# Patient Record
Sex: Male | Born: 1970
Health system: Southern US, Community
[De-identification: ages and names within clinical notes are randomized; demographics above are authoritative.]

## PROBLEM LIST (undated history)

## (undated) DIAGNOSIS — N189 Chronic kidney disease, unspecified: Secondary | ICD-10-CM

## (undated) DIAGNOSIS — G459 Transient cerebral ischemic attack, unspecified: Secondary | ICD-10-CM

## (undated) DIAGNOSIS — W19XXXA Unspecified fall, initial encounter: Secondary | ICD-10-CM

## (undated) DIAGNOSIS — Z862 Personal history of diseases of the blood and blood-forming organs and certain disorders involving the immune mechanism: Secondary | ICD-10-CM

## (undated) DIAGNOSIS — E119 Type 2 diabetes mellitus without complications: Secondary | ICD-10-CM

## (undated) DIAGNOSIS — N186 End stage renal disease: Secondary | ICD-10-CM

## (undated) DIAGNOSIS — H5462 Unqualified visual loss, left eye, normal vision right eye: Secondary | ICD-10-CM

## (undated) DIAGNOSIS — Z87828 Personal history of other (healed) physical injury and trauma: Secondary | ICD-10-CM

## (undated) DIAGNOSIS — D649 Anemia, unspecified: Secondary | ICD-10-CM

## (undated) DIAGNOSIS — A0472 Enterocolitis due to Clostridium difficile, not specified as recurrent: Secondary | ICD-10-CM

## (undated) DIAGNOSIS — Z992 Dependence on renal dialysis: Secondary | ICD-10-CM

## (undated) DIAGNOSIS — N289 Disorder of kidney and ureter, unspecified: Secondary | ICD-10-CM

## (undated) DIAGNOSIS — I10 Essential (primary) hypertension: Secondary | ICD-10-CM

## (undated) HISTORY — DX: Transient cerebral ischemic attack, unspecified: G45.9

## (undated) HISTORY — PX: KNEE SURGERY: SHX244

## (undated) HISTORY — PX: SKIN SPLIT GRAFT: SHX444

## (undated) HISTORY — PX: FOOT SURGERY: SHX648

---

## 1997-05-09 ENCOUNTER — Emergency Department (HOSPITAL_COMMUNITY): Admission: EM | Admit: 1997-05-09 | Discharge: 1997-05-09 | Payer: Self-pay | Admitting: Emergency Medicine

## 2000-08-07 ENCOUNTER — Encounter: Payer: Self-pay | Admitting: Family Medicine

## 2000-08-07 ENCOUNTER — Ambulatory Visit (HOSPITAL_COMMUNITY): Admission: RE | Admit: 2000-08-07 | Discharge: 2000-08-07 | Payer: Self-pay | Admitting: Family Medicine

## 2000-12-23 ENCOUNTER — Encounter: Admission: RE | Admit: 2000-12-23 | Discharge: 2001-01-21 | Payer: Self-pay | Admitting: Orthopedic Surgery

## 2001-03-23 ENCOUNTER — Emergency Department (HOSPITAL_COMMUNITY): Admission: EM | Admit: 2001-03-23 | Discharge: 2001-03-24 | Payer: Self-pay | Admitting: Emergency Medicine

## 2005-02-11 ENCOUNTER — Inpatient Hospital Stay (HOSPITAL_COMMUNITY): Admission: EM | Admit: 2005-02-11 | Discharge: 2005-02-16 | Payer: Self-pay | Admitting: Emergency Medicine

## 2005-02-11 IMAGING — CR DG CHEST 1V PORT
1 series · 1 of 1 positions shown · non-contrast
Comparison: None.

CLINICAL DATA: Diabetic ketoacidosis. Shortness of breath.

PORTABLE CHEST - 1 VIEW  [DATE]/[PHONE_NUMBER] hours:

[view not recorded]
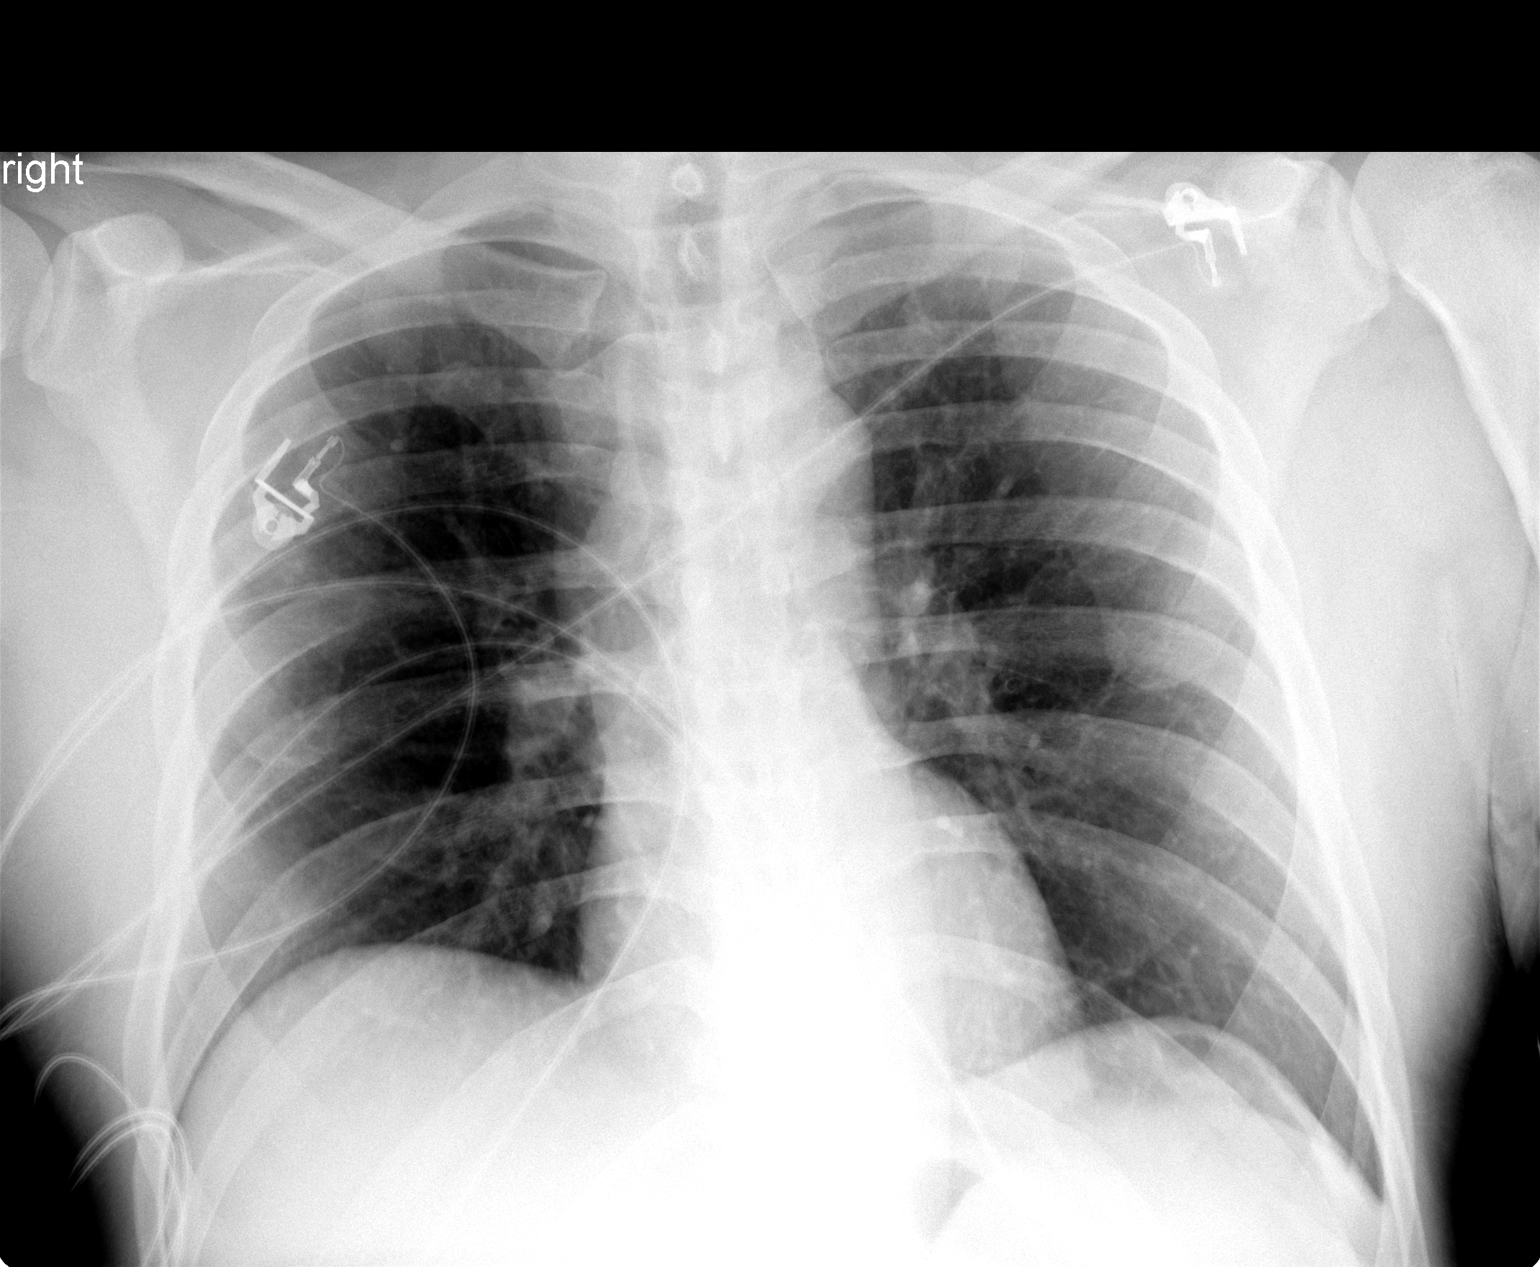

[1 of 1 positions shown; findings below may reference images not displayed]

FINDINGS: The heart size is normal. The lungs appear clear. There are no
significant pleural effusions.
IMPRESSION: No acute cardiopulmonary disease.

## 2005-02-11 IMAGING — CT CT PELVIS W/O CM
1 of 2 series · 15 of 32 positions shown, 19 images · IV contrast (agent unspecified)
Comparison: None available.

CLINICAL DATA: Abdominal and pelvic pain with right flank pain. 
ABDOMEN CT WITHOUT CONTRAST:
TECHNIQUE: Multidetector CT imaging of the abdomen was performed following the standard protocol without IV contrast.
TECHNIQUE: Multidetector CT imaging of the pelvis was performed following the standard protocol without IV contrast.

[Series 2: stone_wo 5.0 b40f st · axial · 0.72mm/px · z∈[-477,-17]mm · 15 of 126 slices shown, 19 images]
[im 6/126  soft-tissue]
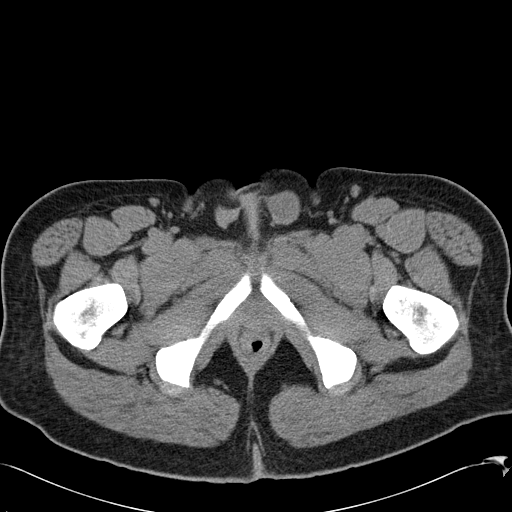
[im 6/126  bone]
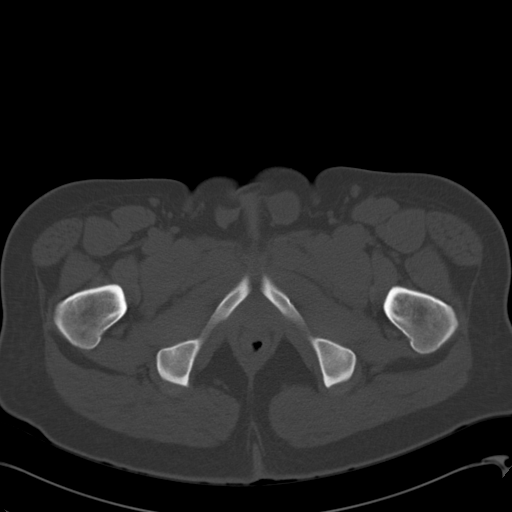
[im 16/126  soft-tissue]
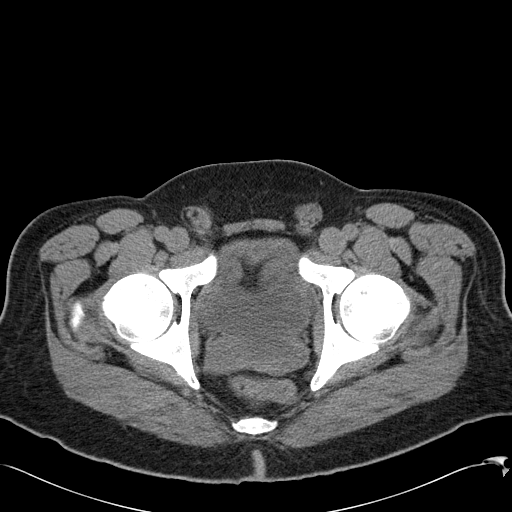
[im 26/126  soft-tissue]
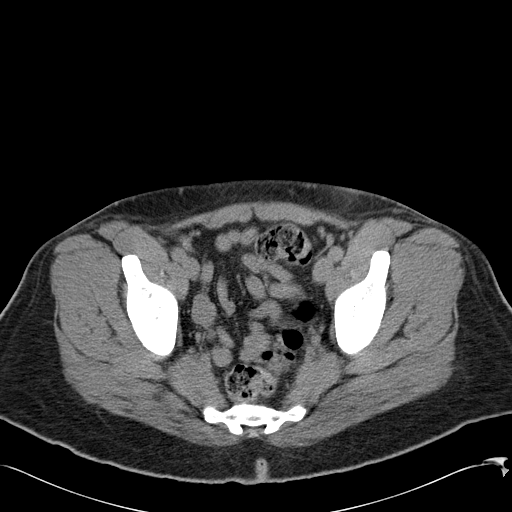
[im 36/126  soft-tissue]
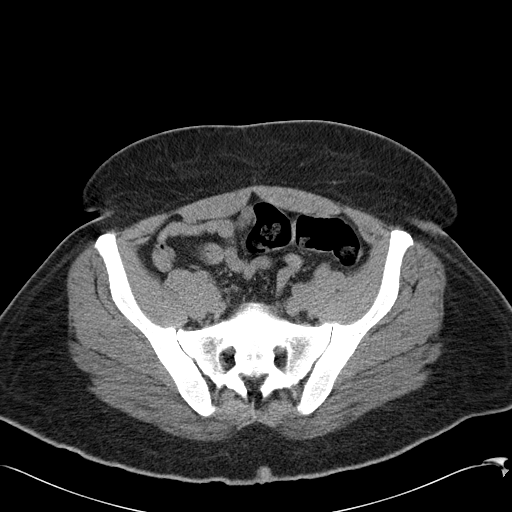
[im 46/126  soft-tissue]
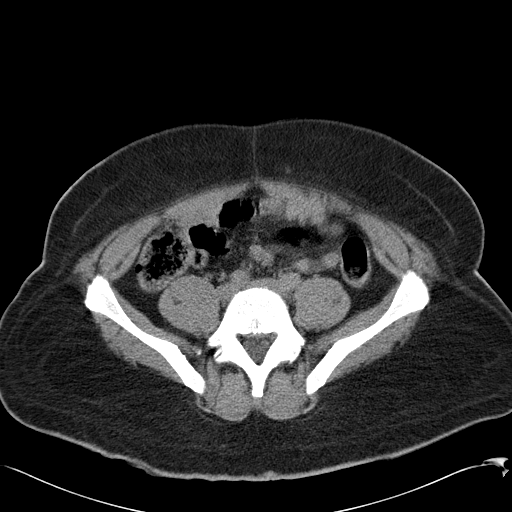
[im 56/126  soft-tissue]
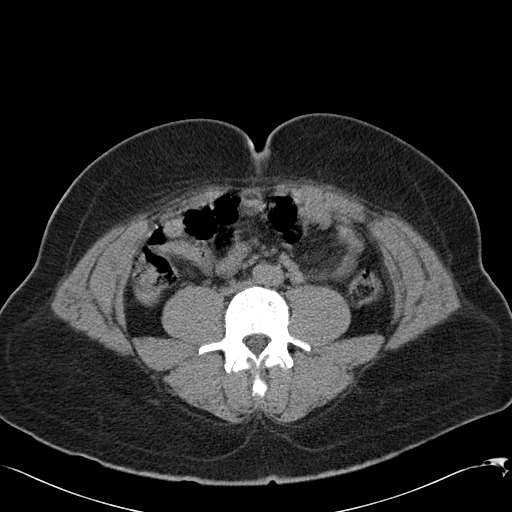
[im 66/126  soft-tissue]
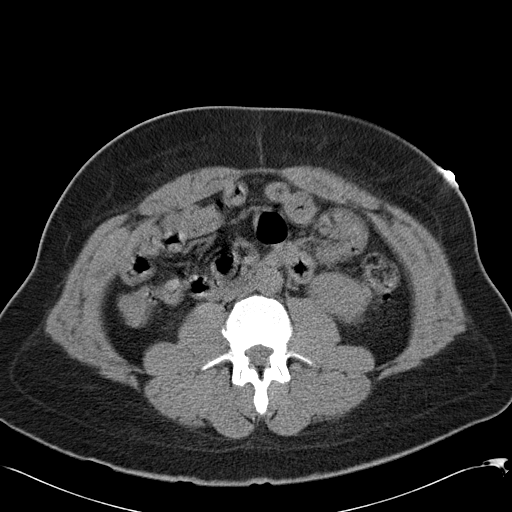
[im 71/126  soft-tissue]
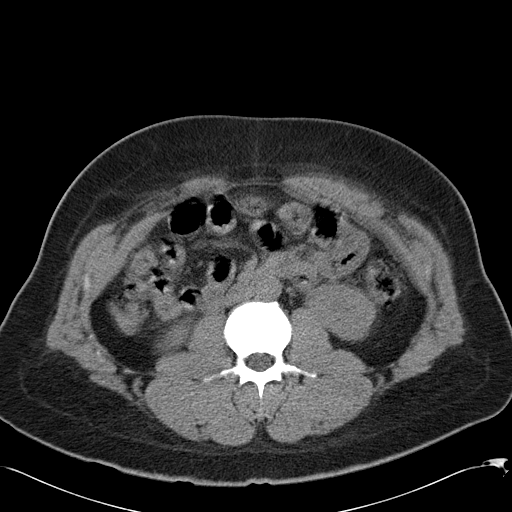
[im 81/126  soft-tissue]
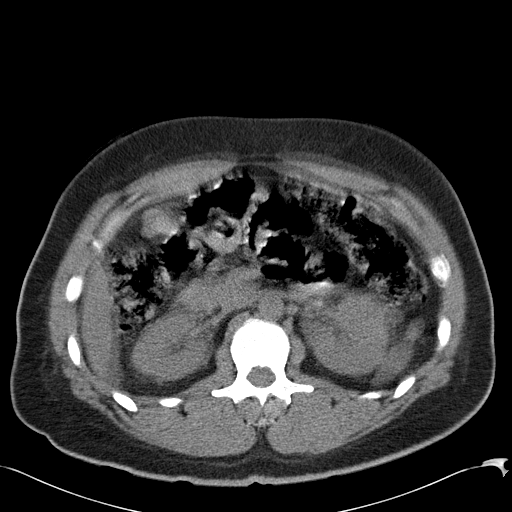
[im 81/126  bone]
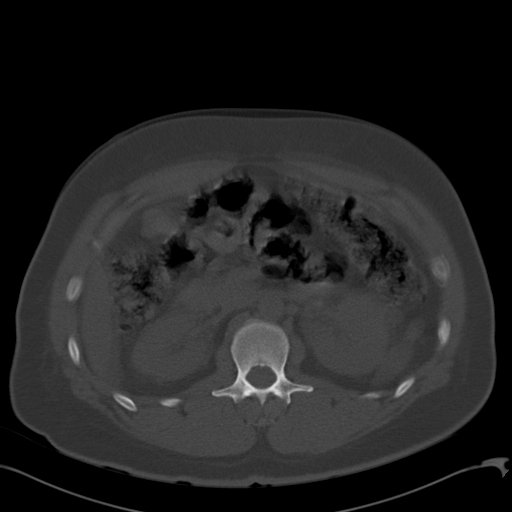
[im 91/126  soft-tissue]
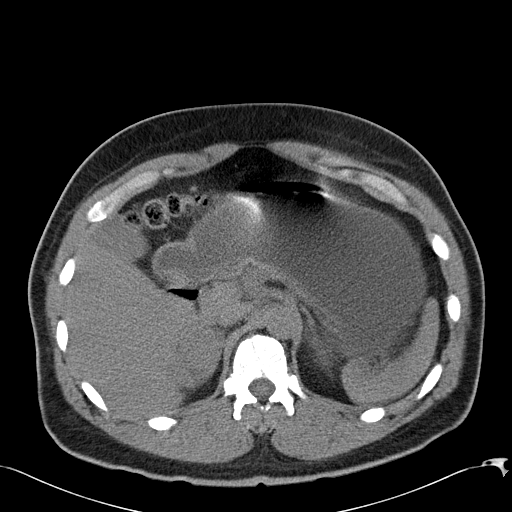
[im 101/126  soft-tissue]
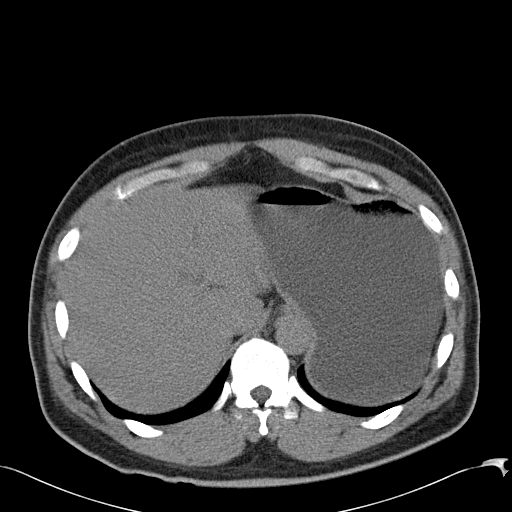
[im 106/126  lung]
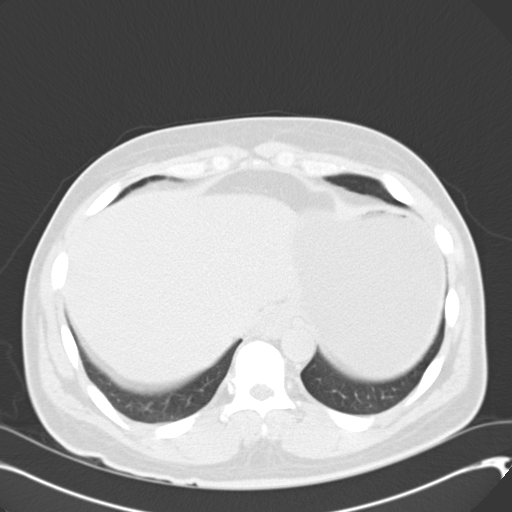
[im 111/126  soft-tissue]
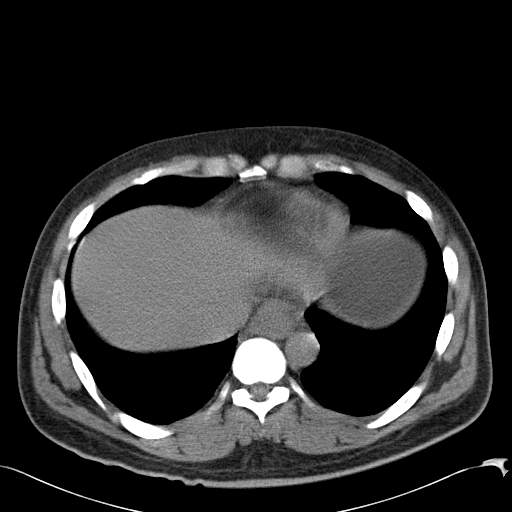
[im 111/126  lung]
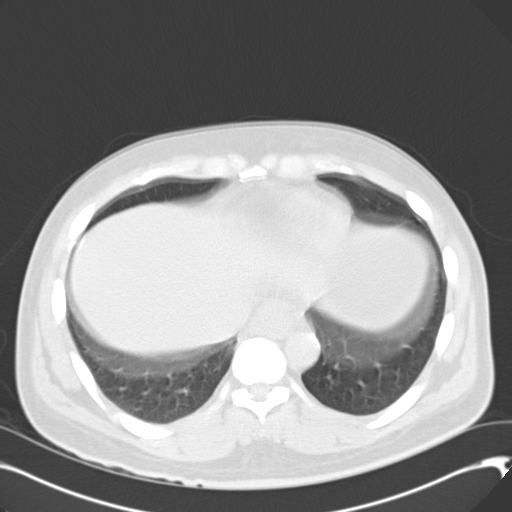
[im 116/126  lung]
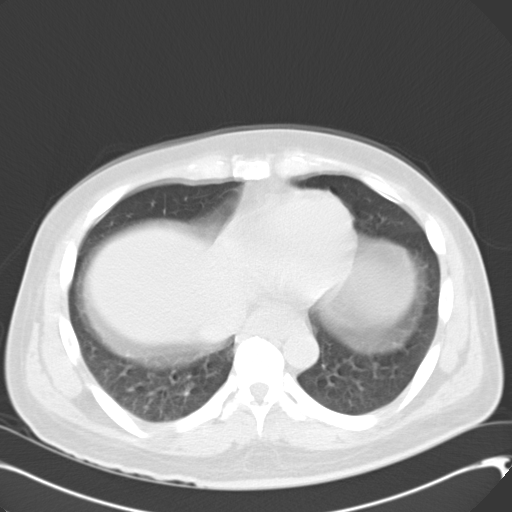
[im 121/126  soft-tissue]
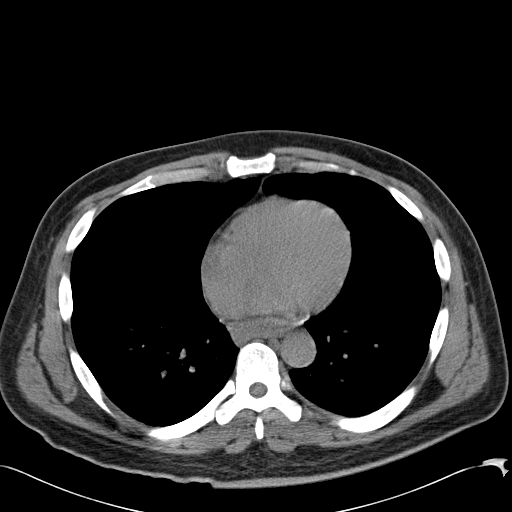
[im 121/126  lung]
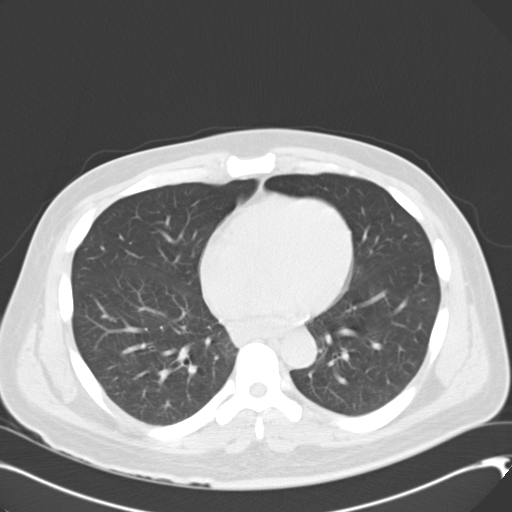

[15 of 32 positions shown; findings below may reference images not displayed]

FINDINGS: The study is slightly limited secondary to motion.
Mild perinephric inflammation bilaterally is identified, without evidence of other renal abnormality.  There is no evidence of hydronephrosis or urinary calculi.  The liver, spleen, adrenal glands, gallbladder, and pancreas are unremarkable.  Please note that parenchymal abnormalities may be missed as intravenous contrast was not administered.  No free fluid or enlarged lymph nodes, biliary dilatation or abdominal aortic aneurysm.  Mild gastric distention is noted.  Remainder of the bowel is unremarkable.  An umbilical hernia containing fat is noted.
IMPRESSION: Nonspecific bilateral perinephric inflammation ? differential includes infection/pyelonephritis, or other causes of inflammation.  No evidence of urinary calculi.  
PELVIS CT WITHOUT CONTRAST:
FINDINGS: The bowel, bladder, and appendix are unremarkable.  No free fluid or enlarged lymph nodes.  Soft tissue in both inguinal canals may represent retracted testicles but correlate clinically.
IMPRESSION: 1.  No acute abnormality. 
2.  Soft tissue in both inguinal canals ? suspect retracted testicles but correlate clinically.

## 2005-02-15 ENCOUNTER — Ambulatory Visit: Payer: Self-pay | Admitting: Internal Medicine

## 2005-02-19 ENCOUNTER — Ambulatory Visit: Payer: Self-pay | Admitting: Nurse Practitioner

## 2005-08-17 ENCOUNTER — Emergency Department (HOSPITAL_COMMUNITY): Admission: EM | Admit: 2005-08-17 | Discharge: 2005-08-17 | Payer: Self-pay | Admitting: Emergency Medicine

## 2005-10-01 ENCOUNTER — Emergency Department (HOSPITAL_COMMUNITY): Admission: EM | Admit: 2005-10-01 | Discharge: 2005-10-02 | Payer: Self-pay | Admitting: Emergency Medicine

## 2015-01-09 DIAGNOSIS — K3184 Gastroparesis: Secondary | ICD-10-CM

## 2015-01-09 HISTORY — DX: Gastroparesis: K31.84

## 2016-01-09 HISTORY — PX: HERNIA REPAIR: SHX51

## 2016-07-05 DIAGNOSIS — E538 Deficiency of other specified B group vitamins: Secondary | ICD-10-CM | POA: Insufficient documentation

## 2016-07-05 DIAGNOSIS — E878 Other disorders of electrolyte and fluid balance, not elsewhere classified: Secondary | ICD-10-CM | POA: Insufficient documentation

## 2016-07-05 DIAGNOSIS — Z578 Occupational exposure to other risk factors: Secondary | ICD-10-CM | POA: Insufficient documentation

## 2016-07-05 DIAGNOSIS — N2581 Secondary hyperparathyroidism of renal origin: Secondary | ICD-10-CM | POA: Insufficient documentation

## 2016-07-05 DIAGNOSIS — N2589 Other disorders resulting from impaired renal tubular function: Secondary | ICD-10-CM | POA: Insufficient documentation

## 2016-07-05 DIAGNOSIS — M834 Aluminum bone disease: Secondary | ICD-10-CM | POA: Insufficient documentation

## 2016-07-05 DIAGNOSIS — E639 Nutritional deficiency, unspecified: Secondary | ICD-10-CM | POA: Insufficient documentation

## 2016-07-05 HISTORY — DX: Occupational exposure to other risk factors: Z57.8

## 2016-07-05 HISTORY — DX: Deficiency of other specified B group vitamins: E53.8

## 2016-07-30 DIAGNOSIS — E209 Hypoparathyroidism, unspecified: Secondary | ICD-10-CM

## 2016-07-30 HISTORY — DX: Hypoparathyroidism, unspecified: E20.9

## 2017-06-13 ENCOUNTER — Encounter (HOSPITAL_COMMUNITY): Payer: Self-pay | Admitting: Emergency Medicine

## 2017-06-13 ENCOUNTER — Other Ambulatory Visit: Payer: Self-pay

## 2017-06-13 ENCOUNTER — Emergency Department (HOSPITAL_COMMUNITY)
Admission: EM | Admit: 2017-06-13 | Discharge: 2017-06-14 | Disposition: A | Discharge status: Home / Self Care | Payer: Self-pay | Attending: Emergency Medicine | Admitting: Emergency Medicine

## 2017-06-13 DIAGNOSIS — I10 Essential (primary) hypertension: Secondary | ICD-10-CM | POA: Insufficient documentation

## 2017-06-13 DIAGNOSIS — R112 Nausea with vomiting, unspecified: Secondary | ICD-10-CM

## 2017-06-13 DIAGNOSIS — E119 Type 2 diabetes mellitus without complications: Secondary | ICD-10-CM | POA: Insufficient documentation

## 2017-06-13 DIAGNOSIS — Z79899 Other long term (current) drug therapy: Secondary | ICD-10-CM | POA: Insufficient documentation

## 2017-06-13 DIAGNOSIS — K3184 Gastroparesis: Secondary | ICD-10-CM | POA: Insufficient documentation

## 2017-06-13 HISTORY — DX: Disorder of kidney and ureter, unspecified: N28.9

## 2017-06-13 HISTORY — DX: Essential (primary) hypertension: I10

## 2017-06-13 HISTORY — DX: Type 2 diabetes mellitus without complications: E11.9

## 2017-06-13 LAB — COMPREHENSIVE METABOLIC PANEL
ALT: 21 U/L (ref 17–63)
AST: 20 U/L (ref 15–41)
Albumin: 3.7 g/dL (ref 3.5–5.0)
Alkaline Phosphatase: 128 U/L — ABNORMAL HIGH (ref 38–126)
Anion gap: 15 (ref 5–15)
BUN: 41 mg/dL — AB (ref 6–20)
CHLORIDE: 107 mmol/L (ref 101–111)
CO2: 21 mmol/L — AB (ref 22–32)
CREATININE: 4 mg/dL — AB (ref 0.61–1.24)
Calcium: 9 mg/dL (ref 8.9–10.3)
GFR calc Af Amer: 19 mL/min — ABNORMAL LOW (ref >60)
GFR, EST NON AFRICAN AMERICAN: 16 mL/min — AB (ref >60)
Glucose, Bld: 326 mg/dL — ABNORMAL HIGH (ref 65–99)
Potassium: 4.2 mmol/L (ref 3.5–5.1)
Sodium: 143 mmol/L (ref 135–145)
Total Bilirubin: 1 mg/dL (ref 0.3–1.2)
Total Protein: 7.4 g/dL (ref 6.5–8.1)

## 2017-06-13 LAB — CBC
HCT: 40.9 % (ref 39.0–52.0)
Hemoglobin: 13.2 g/dL (ref 13.0–17.0)
MCH: 29.1 pg (ref 26.0–34.0)
MCHC: 32.3 g/dL (ref 30.0–36.0)
MCV: 90.1 fL (ref 78.0–100.0)
PLATELETS: 189 10*3/uL (ref 150–400)
RBC: 4.54 MIL/uL (ref 4.22–5.81)
RDW: 13.3 % (ref 11.5–15.5)
WBC: 12.9 10*3/uL — AB (ref 4.0–10.5)

## 2017-06-13 LAB — I-STAT TROPONIN, ED: Troponin i, poc: 0.02 ng/mL (ref 0.00–0.08)

## 2017-06-13 LAB — URINALYSIS, ROUTINE W REFLEX MICROSCOPIC
Bilirubin Urine: NEGATIVE
Glucose, UA: >=500 mg/dL — AB
Hgb urine dipstick: NEGATIVE
Ketones, ur: 5 mg/dL — AB
Leukocytes, UA: NEGATIVE
Nitrite: NEGATIVE
PH: 5 (ref 5.0–8.0)
Protein, ur: 100 mg/dL — AB
SPECIFIC GRAVITY, URINE: 1.015 (ref 1.005–1.030)

## 2017-06-13 LAB — LIPASE, BLOOD: LIPASE: 20 U/L (ref 11–51)

## 2017-06-13 MED ORDER — SODIUM CHLORIDE 0.9 % IV BOLUS
1000.0000 mL | Freq: Once | INTRAVENOUS | Status: AC
  Administered 2017-06-13: 1000 mL via INTRAVENOUS

## 2017-06-13 MED ORDER — MORPHINE SULFATE (PF) 4 MG/ML IV SOLN
4.0000 mg | Freq: Once | INTRAVENOUS | Status: AC
  Administered 2017-06-13: 4 mg via INTRAVENOUS
  Filled 2017-06-13: qty 1

## 2017-06-13 MED ORDER — HALOPERIDOL LACTATE 5 MG/ML IJ SOLN
2.5000 mg | Freq: Once | INTRAMUSCULAR | Status: AC
  Administered 2017-06-13: 2.5 mg via INTRAVENOUS
  Filled 2017-06-13: qty 1

## 2017-06-13 MED ORDER — ONDANSETRON 4 MG PO TBDP
4.0000 mg | ORAL_TABLET | Freq: Once | ORAL | Status: AC | PRN
  Administered 2017-06-13: 4 mg via ORAL
  Filled 2017-06-13: qty 1

## 2017-06-13 MED ORDER — ONDANSETRON HCL 4 MG PO TABS: 4.0000 mg | ORAL_TABLET | Freq: Four times a day (QID) | ORAL | 0 refills | Status: DC

## 2017-06-13 MED ORDER — BISACODYL 10 MG RE SUPP: 10.0000 mg | RECTAL | 0 refills | Status: DC | PRN

## 2017-06-13 MED ORDER — DOCUSATE SODIUM 100 MG PO CAPS: 100.0000 mg | ORAL_CAPSULE | Freq: Two times a day (BID) | ORAL | 0 refills | Status: DC

## 2017-06-13 MED ORDER — ONDANSETRON HCL 4 MG/2ML IJ SOLN
4.0000 mg | Freq: Once | INTRAMUSCULAR | Status: AC
  Administered 2017-06-13: 4 mg via INTRAVENOUS
  Filled 2017-06-13: qty 2

## 2017-06-13 NOTE — Discharge Instructions (Signed)
Evaluated in the emergency department for intractable vomiting that is likely due to your gastroparesis.  You were given IV fluids and medication for your vomiting which help with your symptoms.  You were also given pain medicine.  We are prescribing some nausea medicine and a stool softener to help with moving her bowels.  It is important that you follow-up with your regular doctor and return if any worsening symptoms.

## 2017-06-13 NOTE — ED Provider Notes (Signed)
[facility] EMERGENCY DEPARTMENT Provider Note   CSN: [id number] Arrival date & time: 06/13/17  1816     History   Chief Complaint Chief Complaint  Patient presents with  . Emesis    HPI Jorge [name] is a 47 y.o. male.  He is a history of diabetes and he is on peritoneal dialysis.  He is visiting from [place].  States this is a third time in the last 2 weeks for coming to the ER for intractable vomiting and states that this is his gastroparesis.  He says they have given him various things that help with his symptoms but nothing is completely improved.  He denies any fever.  He is got some generalized abdominal pain from his vomiting.  He does not think there is been any blood in the vomitus.  States he is thrown up multiple times today and is been unable to stop it.  He is also been constipated for a couple of weeks.  He blames it on not having much in his system because he has not been eating and is been throwing up so much.  His symptoms for this episode acutely got worse overnight.  The history is provided by the patient.  Emesis   This is a recurrent problem. The current episode started 12 to 24 hours ago. The problem occurs more than 10 times per day. The problem has not changed since onset.The emesis has an appearance of stomach contents. There has been no fever. Associated symptoms include abdominal pain. Pertinent negatives include no arthralgias, no chills, no cough, no diarrhea, no fever, no headaches, no myalgias, no sweats and no URI.    Past Medical History:  Diagnosis Date  . Diabetes mellitus without complication ([place])   . Hypertension   . Renal disorder     There are no active problems to display for this patient.   Past Surgical History:  Procedure Laterality Date  . FOOT SURGERY Left   . KNEE SURGERY Left         Home Medications    Prior to Admission medications   Medication Sig Start Date End Date Taking? Authorizing Provider    calcitRIOL (ROCALTROL) 0.25 MCG capsule Take 0.25 mcg by mouth every Monday, Wednesday, and Friday. 06/10/17   [provider]  Multiple Vitamins-Minerals (PRORENAL + D) TABS Take 1 tablet by mouth daily. On dialysis days take after dialysis treatment 04/08/17   [provider]  RENVELA 800 MG tablet Take 2,400 mg by mouth 3 (three) times daily. 05/10/17   [provider]    Family History No family history on file.  Social History Social History   Tobacco Use  . Smoking status: Never Smoker  . Smokeless tobacco: Never Used  Substance Use Topics  . Alcohol use: Never    Frequency: Never  . Drug use: Never     Allergies   Patient has no known allergies.   Review of Systems Review of Systems  Constitutional: Negative for chills and fever.  HENT: Negative for sore throat.   Eyes: Negative for visual disturbance.  Respiratory: Negative for cough and shortness of breath.   Cardiovascular: Negative for chest pain.  Gastrointestinal: Positive for abdominal pain, constipation and vomiting. Negative for diarrhea.  Genitourinary: Negative for dysuria.  Musculoskeletal: Negative for arthralgias and myalgias.  Skin: Negative for rash.  Neurological: Negative for headaches.     Physical Exam Updated Vital Signs BP (!) 201/119 (BP Location: Right Arm)  Pulse (!) 107   Temp 99.5 F (37.5 C) (Oral)   Resp 16   Ht 5\' 11"  (1.803 m)   Wt 74.8 kg (165 lb)   SpO2 98%   BMI 23.01 kg/m   Physical Exam  Constitutional: He is oriented to person, place, and time. He appears well-developed and well-nourished.  HENT:  Head: Normocephalic and atraumatic.  Right Ear: External ear normal.  Left Ear: External ear normal.  Nose: Nose normal.  Mouth/Throat: Oropharynx is clear and moist.  Eyes: Conjunctivae are normal.  Neck: Neck supple.  Cardiovascular: Regular rhythm. Tachycardia present.  No murmur heard. Pulmonary/Chest: Effort normal and breath sounds  normal. No respiratory distress.  Abdominal: Soft. There is no tenderness.  Musculoskeletal: He exhibits no edema, tenderness or deformity.  Neurological: He is alert and oriented to person, place, and time. He has normal strength. GCS eye subscore is 4. GCS verbal subscore is 5. GCS motor subscore is 6.  Skin: Skin is warm and dry.  Psychiatric: He has a normal mood and affect.  Nursing note and vitals reviewed.    ED Treatments / Results  Labs (all labs ordered are listed, but only abnormal results are displayed) Labs Reviewed  COMPREHENSIVE METABOLIC PANEL - Abnormal; Notable for the following components:      Result Value   CO2 21 (*)    Glucose, Bld 326 (*)    BUN 41 (*)    Creatinine, Ser 4.00 (*)    Alkaline Phosphatase 128 (*)    GFR calc non Af Amer 16 (*)    GFR calc Af Amer 19 (*)    All other components within normal limits  CBC - Abnormal; Notable for the following components:   WBC 12.9 (*)    All other components within normal limits  URINALYSIS, ROUTINE W REFLEX MICROSCOPIC - Abnormal; Notable for the following components:   APPearance HAZY (*)    Glucose, UA >=500 (*)    Ketones, ur 5 (*)    Protein, ur 100 (*)    Bacteria, UA RARE (*)    All other components within normal limits  LIPASE, BLOOD  I-STAT TROPONIN, ED    EKG None  Radiology No results found.  Procedures Procedures (including critical care time)  Medications Ordered in ED Medications  sodium chloride 0.9 % bolus 1,000 mL (has no administration in time range)  haloperidol lactate (HALDOL) injection 2.5 mg (has no administration in time range)  ondansetron (ZOFRAN-ODT) disintegrating tablet 4 mg (4 mg Oral Given 06/13/17 1954)     Initial Impression / Assessment and Plan / ED Course  I have reviewed the triage vital signs and the nursing notes.  Pertinent labs & imaging results that were available during my care of the patient were reviewed by me and considered in my medical decision  making (see chart for details).  Clinical Course as of Jun 16 1150  Thu Jun 13, 2017  2322 Patient.  He states he is still with some abdominal pain but he has not vomited since the Haldol.  He was able to sleep for a little bit.  As far as the abdominal pain he says morphine usually helps and he is also concerned he has not a bowel movement a couple weeks so I will look into what laxatives and get somebody on peritoneal dialysis.   [MB]  2356 Patient had some improvement after some pain control and is comfortable going home.  We will prescribe him some nausea medicine and  after discussion with pharmacy it sounds like he can take Dulcolax and docusate for constipation.  States he is heading back to the [place] on Sunday and hopefully can follow-up with his doctors there.   [MB]    Clinical Course User Index [MB] Kymani Laursen C, MD     Final Clinical Impressions(s) / ED Diagnoses   Final diagnoses:  Non-intractable vomiting with nausea, unspecified vomiting type  Gastroparesis    ED Discharge Orders        Ordered    ondansetron (ZOFRAN) 4 MG tablet  Every 6 hours     06/13/17 2346    docusate sodium (COLACE) 100 MG capsule  Every 12 hours     06/13/17 2346    bisacodyl (DULCOLAX) 10 MG suppository  As needed     06 /06/19 2346       Hayden Rasmussen, MD 06/15/17 1152

## 2017-06-13 NOTE — ED Triage Notes (Signed)
Pt reports N/V and gen abd pain onset this AM. Pt states hx of gastroporesis and thinks "it is flaring up"

## 2017-07-09 LAB — CBC AND DIFFERENTIAL
HCT: 43 (ref 41–53)
Hemoglobin: 13.5 (ref 13.5–17.5)
Neutrophils Absolute: 3
Platelets: 124 — AB (ref 150–399)
WBC: 6.5

## 2017-07-09 LAB — COMPREHENSIVE METABOLIC PANEL
Albumin: 3.8 (ref 3.5–5.0)
Calcium: 9.2 (ref 8.7–10.7)
GFR calc Af Amer: 15
GFR calc non Af Amer: 18
Globulin: 2.9

## 2017-07-10 LAB — BASIC METABOLIC PANEL
BUN: 51 — AB (ref 4–21)
CO2: 18 (ref 13–22)
Chloride: 102 (ref 99–108)
Creatinine: 4.2 — AB (ref 0.6–1.3)
Glucose: 248
Potassium: 5.6 — AB (ref 3.4–5.3)
Sodium: 136 — AB (ref 137–147)

## 2017-07-10 LAB — CBC: RBC: 4.76 (ref 3.87–5.11)

## 2017-07-10 LAB — HEPATIC FUNCTION PANEL
ALT: 29 (ref 10–40)
AST: 22 (ref 14–40)
Alkaline Phosphatase: 171 — AB (ref 25–125)
Bilirubin, Total: 0.2

## 2017-11-06 LAB — HM COLONOSCOPY

## 2019-01-06 DIAGNOSIS — K650 Generalized (acute) peritonitis: Secondary | ICD-10-CM | POA: Insufficient documentation

## 2019-01-06 HISTORY — DX: Generalized (acute) peritonitis: K65.0

## 2019-01-10 DIAGNOSIS — E872 Acidosis, unspecified: Secondary | ICD-10-CM | POA: Insufficient documentation

## 2019-01-10 DIAGNOSIS — Z79899 Other long term (current) drug therapy: Secondary | ICD-10-CM | POA: Insufficient documentation

## 2019-01-10 DIAGNOSIS — R82998 Other abnormal findings in urine: Secondary | ICD-10-CM | POA: Insufficient documentation

## 2019-01-10 DIAGNOSIS — Z4932 Encounter for adequacy testing for peritoneal dialysis: Secondary | ICD-10-CM | POA: Insufficient documentation

## 2019-01-10 DIAGNOSIS — K769 Liver disease, unspecified: Secondary | ICD-10-CM | POA: Insufficient documentation

## 2019-01-13 DIAGNOSIS — Z201 Contact with and (suspected) exposure to tuberculosis: Secondary | ICD-10-CM

## 2019-01-13 DIAGNOSIS — L089 Local infection of the skin and subcutaneous tissue, unspecified: Secondary | ICD-10-CM | POA: Insufficient documentation

## 2019-01-13 HISTORY — DX: Contact with and (suspected) exposure to tuberculosis: Z20.1

## 2019-01-22 ENCOUNTER — Other Ambulatory Visit: Payer: Self-pay

## 2019-01-22 ENCOUNTER — Ambulatory Visit: Payer: Medicare (Managed Care) | Admitting: Podiatry

## 2019-01-22 ENCOUNTER — Telehealth: Payer: Self-pay | Admitting: *Deleted

## 2019-01-22 NOTE — Telephone Encounter (Signed)
Castana asked if pt's referral of 01/14/2019 had been received and if pt is scheduled or has been seen.

## 2019-01-22 NOTE — Telephone Encounter (Signed)
I informed [place] pt is scheduled for today at 2:30pm and he should have received a reminder call 2 - 3 days ago. Shirlee Limerick states she will contact pt to remind.

## 2019-02-11 ENCOUNTER — Emergency Department (HOSPITAL_BASED_OUTPATIENT_CLINIC_OR_DEPARTMENT_OTHER)
Admission: EM | Admit: 2019-02-11 | Discharge: 2019-02-11 | Disposition: A | Discharge status: Home / Self Care | Payer: Medicare (Managed Care) | Attending: Emergency Medicine | Admitting: Emergency Medicine

## 2019-02-11 ENCOUNTER — Other Ambulatory Visit: Payer: Self-pay

## 2019-02-11 ENCOUNTER — Encounter (HOSPITAL_BASED_OUTPATIENT_CLINIC_OR_DEPARTMENT_OTHER): Payer: Self-pay

## 2019-02-11 DIAGNOSIS — Z794 Long term (current) use of insulin: Secondary | ICD-10-CM | POA: Insufficient documentation

## 2019-02-11 DIAGNOSIS — Z992 Dependence on renal dialysis: Secondary | ICD-10-CM | POA: Diagnosis not present

## 2019-02-11 DIAGNOSIS — Z79899 Other long term (current) drug therapy: Secondary | ICD-10-CM | POA: Diagnosis not present

## 2019-02-11 DIAGNOSIS — R1084 Generalized abdominal pain: Secondary | ICD-10-CM | POA: Diagnosis not present

## 2019-02-11 DIAGNOSIS — I129 Hypertensive chronic kidney disease with stage 1 through stage 4 chronic kidney disease, or unspecified chronic kidney disease: Secondary | ICD-10-CM | POA: Insufficient documentation

## 2019-02-11 DIAGNOSIS — N189 Chronic kidney disease, unspecified: Secondary | ICD-10-CM | POA: Insufficient documentation

## 2019-02-11 DIAGNOSIS — R112 Nausea with vomiting, unspecified: Secondary | ICD-10-CM | POA: Diagnosis present

## 2019-02-11 DIAGNOSIS — E1122 Type 2 diabetes mellitus with diabetic chronic kidney disease: Secondary | ICD-10-CM | POA: Insufficient documentation

## 2019-02-11 HISTORY — DX: Dependence on renal dialysis: Z99.2

## 2019-02-11 LAB — COMPREHENSIVE METABOLIC PANEL
ALT: 18 U/L (ref 0–44)
AST: 27 U/L (ref 15–41)
Albumin: 3.1 g/dL — ABNORMAL LOW (ref 3.5–5.0)
Alkaline Phosphatase: 91 U/L (ref 38–126)
Anion gap: 14 (ref 5–15)
BUN: 115 mg/dL — ABNORMAL HIGH (ref 6–20)
CO2: 18 mmol/L — ABNORMAL LOW (ref 22–32)
Calcium: 8.2 mg/dL — ABNORMAL LOW (ref 8.9–10.3)
Chloride: 101 mmol/L (ref 98–111)
Creatinine, Ser: 14.66 mg/dL — ABNORMAL HIGH (ref 0.61–1.24)
GFR calc Af Amer: 4 mL/min — ABNORMAL LOW (ref >60)
GFR calc non Af Amer: 3 mL/min — ABNORMAL LOW (ref >60)
Glucose, Bld: 236 mg/dL — ABNORMAL HIGH (ref 70–99)
Potassium: 5 mmol/L (ref 3.5–5.1)
Sodium: 133 mmol/L — ABNORMAL LOW (ref 135–145)
Total Bilirubin: 0.6 mg/dL (ref 0.3–1.2)
Total Protein: 7.5 g/dL (ref 6.5–8.1)

## 2019-02-11 LAB — CBC WITH DIFFERENTIAL/PLATELET
Abs Immature Granulocytes: 0.02 10*3/uL (ref 0.00–0.07)
Basophils Absolute: 0.1 10*3/uL (ref 0.0–0.1)
Basophils Relative: 1 %
Eosinophils Absolute: 0.3 10*3/uL (ref 0.0–0.5)
Eosinophils Relative: 4 %
HCT: 33.2 % — ABNORMAL LOW (ref 39.0–52.0)
Hemoglobin: 10.3 g/dL — ABNORMAL LOW (ref 13.0–17.0)
Immature Granulocytes: 0 %
Lymphocytes Relative: 24 %
Lymphs Abs: 1.7 10*3/uL (ref 0.7–4.0)
MCH: 27.5 pg (ref 26.0–34.0)
MCHC: 31 g/dL (ref 30.0–36.0)
MCV: 88.8 fL (ref 80.0–100.0)
Monocytes Absolute: 0.7 10*3/uL (ref 0.1–1.0)
Monocytes Relative: 10 %
Neutro Abs: 4.3 10*3/uL (ref 1.7–7.7)
Neutrophils Relative %: 61 %
Platelets: 156 10*3/uL (ref 150–400)
RBC: 3.74 MIL/uL — ABNORMAL LOW (ref 4.22–5.81)
RDW: 17.1 % — ABNORMAL HIGH (ref 11.5–15.5)
WBC: 7.1 10*3/uL (ref 4.0–10.5)
nRBC: 0 % (ref 0.0–0.2)

## 2019-02-11 LAB — LIPASE, BLOOD: Lipase: 31 U/L (ref 11–51)

## 2019-02-11 LAB — OCCULT BLOOD X 1 CARD TO LAB, STOOL: Fecal Occult Bld: NEGATIVE

## 2019-02-11 NOTE — ED Provider Notes (Signed)
[facility] EMERGENCY DEPARTMENT Provider Note   CSN: [id number] Arrival date & time: 02/11/19  1849     History Chief Complaint  Patient presents with  . Abdominal Pain    Chinmay KANIEL BOTLEY is a 49 y.o. male possible history of diabetes, hypertension, peritoneal dialysis, renal disorder who presents for evaluation of abdominal pain, nausea/vomiting has been ongoing for last 3 days.  He states it is coming and going.  He states that vomiting has improved.  No blood in his vomit.  He states that the pain is all over and states that nothing makes it better or worse.  He states it feels like his previous gastroparesis pain.  He states that he has had some darker stools over the last several months.  No bright red blood.  He states he has not had any diarrhea.  He denies any fevers, chest pain, difficulty breathing.  He does get peritoneal dialysis which he does at home.  The history is provided by the patient.       Past Medical History:  Diagnosis Date  . Diabetes mellitus without complication ([place])   . Hypertension   . Peritoneal dialysis status ([place])   . Renal disorder     There are no problems to display for this patient.   Past Surgical History:  Procedure Laterality Date  . FOOT SURGERY Left   . KNEE SURGERY Left        No family history on file.  Social History   Tobacco Use  . Smoking status: Never Smoker  . Smokeless tobacco: Never Used  Substance Use Topics  . Alcohol use: Never  . Drug use: Never    Home Medications Prior to Admission medications   Medication Sig Start Date End Date Taking? Authorizing Provider  amLODipine (NORVASC) 10 MG tablet Take 10 mg by mouth daily.    [provider]  bisacodyl (DULCOLAX) 10 MG suppository Place 1 suppository (10 mg total) rectally as needed for moderate constipation. 06/13/17   Hayden Rasmussen, MD  cloNIDine (CATAPRES) 0.1 MG tablet Take 0.1 mg by mouth at bedtime.    [provider]    docusate sodium (COLACE) 100 MG capsule Take 1 capsule (100 mg total) by mouth every 12 (twelve) hours. 06/13/17   Hayden Rasmussen, MD  hydrALAZINE (APRESOLINE) 100 MG tablet Take 150 mg by mouth 2 (two) times daily.    [provider]  insulin glargine (LANTUS) 100 UNIT/ML injection Inject 10 Units into the skin at bedtime.    [provider]  insulin regular (NOVOLIN R,HUMULIN R) 100 units/mL injection Inject 3-13 Units into the skin 3 (three) times daily as needed for high blood sugar (per sliding scale).    [provider]  ondansetron (ZOFRAN) 4 MG tablet Take 1 tablet (4 mg total) by mouth every 6 (six) hours. 06/13/17   Hayden Rasmussen, MD  RENVELA 800 MG tablet Take 2,400 mg by mouth 3 (three) times daily. 05/10/17   [provider]    Allergies    Patient has no known allergies.  Review of Systems   Review of Systems  Constitutional: Negative for fever.  Respiratory: Negative for cough and shortness of breath.   Cardiovascular: Negative for chest pain.  Gastrointestinal: Positive for abdominal pain, nausea and vomiting. Negative for diarrhea.  Genitourinary: Negative for dysuria and hematuria.  Neurological: Negative for headaches.  All other systems reviewed and are negative.   Physical Exam Updated Vital Signs BP Marland Kitchen)  150/82 (BP Location: Right Arm)   Pulse 84   Temp 99 F (37.2 C) (Oral)   Resp 15   Ht 5\' 11"  (1.803 m)   Wt 79.4 kg   SpO2 96%   BMI 24.41 kg/m   Physical Exam Vitals and nursing note reviewed. Exam conducted with a chaperone present.  Constitutional:      Appearance: Normal appearance. He is well-developed.     Comments: Sitting comfortably on examination table  HENT:     Head: Normocephalic and atraumatic.  Eyes:     General: Lids are normal.     Conjunctiva/sclera: Conjunctivae normal.     Pupils: Pupils are equal, round, and reactive to light.  Cardiovascular:     Rate and Rhythm: Normal rate and regular  rhythm.     Pulses: Normal pulses.     Heart sounds: Normal heart sounds. No murmur. No friction rub. No gallop.   Pulmonary:     Effort: Pulmonary effort is normal.     Breath sounds: Normal breath sounds.     Comments: Lungs clear to auscultation bilaterally.  Symmetric chest rise.  No wheezing, rales, rhonchi. Abdominal:     Palpations: Abdomen is soft. Abdomen is not rigid.     Tenderness: There is no abdominal tenderness. There is no guarding.     Comments: Abdomen is soft, non-distended, non-tender. No rigidity, No guarding. No peritoneal signs.  No CVA tenderness noted bilaterally.  Genitourinary:    Comments: The exam was performed with a chaperone present. Digital Rectal Exam reveals sphincter with good tone. No external hemorrhoids. No masses or fissures. Stool color is brown with no overt blood. Musculoskeletal:        General: Normal range of motion.     Cervical back: Full passive range of motion without pain.  Skin:    General: Skin is warm and dry.     Capillary Refill: Capillary refill takes less than 2 seconds.  Neurological:     Mental Status: He is alert and oriented to person, place, and time.  Psychiatric:        Speech: Speech normal.     ED Results / Procedures / Treatments   Labs (all labs ordered are listed, but only abnormal results are displayed) Labs Reviewed  COMPREHENSIVE METABOLIC PANEL - Abnormal; Notable for the following components:      Result Value   Sodium 133 (*)    CO2 18 (*)    Glucose, Bld 236 (*)    BUN 115 (*)    Creatinine, Ser 14.66 (*)    Calcium 8.2 (*)    Albumin 3.1 (*)    GFR calc non Af Amer 3 (*)    GFR calc Af Amer 4 (*)    All other components within normal limits  CBC WITH DIFFERENTIAL/PLATELET - Abnormal; Notable for the following components:   RBC 3.74 (*)    Hemoglobin 10.3 (*)    HCT 33.2 (*)    RDW 17.1 (*)    All other components within normal limits  LIPASE, BLOOD  OCCULT BLOOD X 1 CARD TO LAB, STOOL     EKG None  Radiology No results found.  Procedures Procedures (including critical care time)  Medications Ordered in ED Medications - No data to display  ED Course  I have reviewed the triage vital signs and the nursing notes.  Pertinent labs & imaging results that were available during my care of the patient were reviewed by me and considered  in my medical decision making (see chart for details).    MDM Rules/Calculators/A&P                      49 year old male who presents for evaluation of generalized abdominal pain, nausea/vomiting.  States symptoms are consistent with previous history of gastroparesis.  No fevers.  He does get peritoneal dialysis.  On initial arrival, he is afebrile, nontoxic-appearing.  History/physical exam not concerning for appendicitis, diverticulitis, infectious process.  On ED arrival, repeat states his pain has improved.  On my exam, he does not have any tenderness.  We will plan to check labs.  Lipase is unremarkable.  CMP shows potassium of 5, BUN of 115, creatinine of 14.6.  His previous creatinine was about 4.  He does do peritoneal dialysis.  CBC shows no leukocytosis, anemia.  Fecal occult is negative.  I discussed results with patient.  He reports that he did peritoneal dialysis yesterday.  He does it every day.  He has not done it today.  Instructed him that he needs to do it with his elevated creatinine.  Patient states he is still not having any pain.  He is hemodynamically stable.  At this time, do not feel that he requires CT imaging as his exam is not concerning for surgical abdomen.  Patient states he is ready to go home. At this time, patient exhibits no emergent life-threatening condition that require further evaluation in ED or admission. Patient had ample opportunity for questions and discussion. All patient's questions were answered with full understanding. Strict return precautions discussed. Patient expresses understanding and  agreement to plan.   Portions of this note were generated with [employer]. Dictation errors may occur despite best attempts at proofreading.  Final Clinical Impression(s) / ED Diagnoses Final diagnoses:  Generalized abdominal pain    Rx / DC Orders ED Discharge Orders    None       Desma Mcgregor 02/11/19 2158    Lucrezia Starch, MD 02/13/19 [phone]

## 2019-02-11 NOTE — ED Triage Notes (Signed)
Pt c/o abd pain n/v x 3 days-reports hx of "gastroparesis"-NAD-steady gait

## 2019-02-11 NOTE — Discharge Instructions (Signed)
As we discussed, you need to go home and your peritoneal dialysis today as your kidney function slightly elevated.  Return to the Emergency Department immediately if you experience any worsening abdominal pain, fever, persistent nausea and vomiting, inability keep any food down, pain with urination, blood in your urine or any other worsening or concerning symptoms.

## 2019-03-09 DIAGNOSIS — T7840XS Allergy, unspecified, sequela: Secondary | ICD-10-CM | POA: Insufficient documentation

## 2019-03-19 ENCOUNTER — Inpatient Hospital Stay (HOSPITAL_COMMUNITY)
Admission: EM | Admit: 2019-03-19 | Discharge: 2019-03-31 | DRG: 853 | Disposition: A | Discharge status: Rehabilitation Facility | Payer: Medicare (Managed Care) | Attending: Internal Medicine | Admitting: Internal Medicine

## 2019-03-19 ENCOUNTER — Other Ambulatory Visit: Payer: Self-pay

## 2019-03-19 ENCOUNTER — Emergency Department (HOSPITAL_COMMUNITY): Payer: Medicare (Managed Care)

## 2019-03-19 ENCOUNTER — Encounter (HOSPITAL_COMMUNITY): Payer: Self-pay

## 2019-03-19 DIAGNOSIS — I152 Hypertension secondary to endocrine disorders: Secondary | ICD-10-CM | POA: Diagnosis present

## 2019-03-19 DIAGNOSIS — E1152 Type 2 diabetes mellitus with diabetic peripheral angiopathy with gangrene: Secondary | ICD-10-CM | POA: Diagnosis present

## 2019-03-19 DIAGNOSIS — E1169 Type 2 diabetes mellitus with other specified complication: Secondary | ICD-10-CM | POA: Diagnosis present

## 2019-03-19 DIAGNOSIS — R7309 Other abnormal glucose: Secondary | ICD-10-CM | POA: Diagnosis not present

## 2019-03-19 DIAGNOSIS — E1165 Type 2 diabetes mellitus with hyperglycemia: Secondary | ICD-10-CM | POA: Diagnosis present

## 2019-03-19 DIAGNOSIS — A419 Sepsis, unspecified organism: Secondary | ICD-10-CM | POA: Diagnosis present

## 2019-03-19 DIAGNOSIS — K3184 Gastroparesis: Secondary | ICD-10-CM | POA: Diagnosis present

## 2019-03-19 DIAGNOSIS — E43 Unspecified severe protein-calorie malnutrition: Secondary | ICD-10-CM | POA: Diagnosis present

## 2019-03-19 DIAGNOSIS — Z794 Long term (current) use of insulin: Secondary | ICD-10-CM | POA: Diagnosis not present

## 2019-03-19 DIAGNOSIS — I96 Gangrene, not elsewhere classified: Secondary | ICD-10-CM | POA: Diagnosis not present

## 2019-03-19 DIAGNOSIS — Z20822 Contact with and (suspected) exposure to covid-19: Secondary | ICD-10-CM | POA: Diagnosis present

## 2019-03-19 DIAGNOSIS — Z6822 Body mass index (BMI) 22.0-22.9, adult: Secondary | ICD-10-CM

## 2019-03-19 DIAGNOSIS — T148XXA Other injury of unspecified body region, initial encounter: Secondary | ICD-10-CM | POA: Diagnosis not present

## 2019-03-19 DIAGNOSIS — E871 Hypo-osmolality and hyponatremia: Secondary | ICD-10-CM | POA: Diagnosis present

## 2019-03-19 DIAGNOSIS — E872 Acidosis: Secondary | ICD-10-CM | POA: Diagnosis present

## 2019-03-19 DIAGNOSIS — E1143 Type 2 diabetes mellitus with diabetic autonomic (poly)neuropathy: Secondary | ICD-10-CM | POA: Diagnosis present

## 2019-03-19 DIAGNOSIS — E1122 Type 2 diabetes mellitus with diabetic chronic kidney disease: Secondary | ICD-10-CM | POA: Diagnosis present

## 2019-03-19 DIAGNOSIS — E11649 Type 2 diabetes mellitus with hypoglycemia without coma: Secondary | ICD-10-CM | POA: Diagnosis not present

## 2019-03-19 DIAGNOSIS — D649 Anemia, unspecified: Secondary | ICD-10-CM | POA: Diagnosis present

## 2019-03-19 DIAGNOSIS — M62838 Other muscle spasm: Secondary | ICD-10-CM | POA: Diagnosis present

## 2019-03-19 DIAGNOSIS — I120 Hypertensive chronic kidney disease with stage 5 chronic kidney disease or end stage renal disease: Secondary | ICD-10-CM | POA: Diagnosis present

## 2019-03-19 DIAGNOSIS — E11621 Type 2 diabetes mellitus with foot ulcer: Secondary | ICD-10-CM | POA: Diagnosis present

## 2019-03-19 DIAGNOSIS — Z89511 Acquired absence of right leg below knee: Secondary | ICD-10-CM | POA: Diagnosis not present

## 2019-03-19 DIAGNOSIS — N186 End stage renal disease: Secondary | ICD-10-CM

## 2019-03-19 DIAGNOSIS — E119 Type 2 diabetes mellitus without complications: Secondary | ICD-10-CM | POA: Diagnosis not present

## 2019-03-19 DIAGNOSIS — M869 Osteomyelitis, unspecified: Secondary | ICD-10-CM | POA: Diagnosis present

## 2019-03-19 DIAGNOSIS — Z992 Dependence on renal dialysis: Secondary | ICD-10-CM | POA: Diagnosis not present

## 2019-03-19 DIAGNOSIS — S88119A Complete traumatic amputation at level between knee and ankle, unspecified lower leg, initial encounter: Secondary | ICD-10-CM | POA: Diagnosis not present

## 2019-03-19 DIAGNOSIS — D638 Anemia in other chronic diseases classified elsewhere: Secondary | ICD-10-CM | POA: Diagnosis present

## 2019-03-19 DIAGNOSIS — Z79899 Other long term (current) drug therapy: Secondary | ICD-10-CM

## 2019-03-19 DIAGNOSIS — N2581 Secondary hyperparathyroidism of renal origin: Secondary | ICD-10-CM | POA: Diagnosis present

## 2019-03-19 DIAGNOSIS — L97519 Non-pressure chronic ulcer of other part of right foot with unspecified severity: Secondary | ICD-10-CM | POA: Diagnosis present

## 2019-03-19 DIAGNOSIS — E1140 Type 2 diabetes mellitus with diabetic neuropathy, unspecified: Secondary | ICD-10-CM | POA: Diagnosis present

## 2019-03-19 DIAGNOSIS — E8770 Fluid overload, unspecified: Secondary | ICD-10-CM | POA: Diagnosis present

## 2019-03-19 DIAGNOSIS — I10 Essential (primary) hypertension: Secondary | ICD-10-CM | POA: Diagnosis present

## 2019-03-19 DIAGNOSIS — E1159 Type 2 diabetes mellitus with other circulatory complications: Secondary | ICD-10-CM | POA: Diagnosis present

## 2019-03-19 DIAGNOSIS — L97529 Non-pressure chronic ulcer of other part of left foot with unspecified severity: Secondary | ICD-10-CM | POA: Diagnosis present

## 2019-03-19 DIAGNOSIS — E8889 Other specified metabolic disorders: Secondary | ICD-10-CM | POA: Diagnosis present

## 2019-03-19 DIAGNOSIS — M86271 Subacute osteomyelitis, right ankle and foot: Secondary | ICD-10-CM | POA: Diagnosis not present

## 2019-03-19 DIAGNOSIS — Z4781 Encounter for orthopedic aftercare following surgical amputation: Secondary | ICD-10-CM | POA: Diagnosis not present

## 2019-03-19 DIAGNOSIS — D62 Acute posthemorrhagic anemia: Secondary | ICD-10-CM | POA: Diagnosis not present

## 2019-03-19 DIAGNOSIS — E785 Hyperlipidemia, unspecified: Secondary | ICD-10-CM | POA: Diagnosis present

## 2019-03-19 DIAGNOSIS — E162 Hypoglycemia, unspecified: Secondary | ICD-10-CM | POA: Diagnosis not present

## 2019-03-19 DIAGNOSIS — Z89512 Acquired absence of left leg below knee: Secondary | ICD-10-CM | POA: Diagnosis not present

## 2019-03-19 DIAGNOSIS — L089 Local infection of the skin and subcutaneous tissue, unspecified: Secondary | ICD-10-CM | POA: Diagnosis present

## 2019-03-19 DIAGNOSIS — W19XXXA Unspecified fall, initial encounter: Secondary | ICD-10-CM | POA: Diagnosis not present

## 2019-03-19 DIAGNOSIS — E11628 Type 2 diabetes mellitus with other skin complications: Secondary | ICD-10-CM | POA: Diagnosis present

## 2019-03-19 LAB — POC OCCULT BLOOD, ED: Fecal Occult Bld: NEGATIVE

## 2019-03-19 LAB — CBC WITH DIFFERENTIAL/PLATELET
Abs Immature Granulocytes: 0.15 10*3/uL — ABNORMAL HIGH (ref 0.00–0.07)
Basophils Absolute: 0.1 10*3/uL (ref 0.0–0.1)
Basophils Relative: 1 %
Eosinophils Absolute: 0.2 10*3/uL (ref 0.0–0.5)
Eosinophils Relative: 1 %
HCT: 25.8 % — ABNORMAL LOW (ref 39.0–52.0)
Hemoglobin: 7.7 g/dL — ABNORMAL LOW (ref 13.0–17.0)
Immature Granulocytes: 1 %
Lymphocytes Relative: 22 %
Lymphs Abs: 3.5 10*3/uL (ref 0.7–4.0)
MCH: 29.7 pg (ref 26.0–34.0)
MCHC: 29.8 g/dL — ABNORMAL LOW (ref 30.0–36.0)
MCV: 99.6 fL (ref 80.0–100.0)
Monocytes Absolute: 1.7 10*3/uL — ABNORMAL HIGH (ref 0.1–1.0)
Monocytes Relative: 11 %
Neutro Abs: 10.5 10*3/uL — ABNORMAL HIGH (ref 1.7–7.7)
Neutrophils Relative %: 64 %
Platelets: 292 10*3/uL (ref 150–400)
RBC: 2.59 MIL/uL — ABNORMAL LOW (ref 4.22–5.81)
RDW: 13.9 % (ref 11.5–15.5)
WBC: 16.2 10*3/uL — ABNORMAL HIGH (ref 4.0–10.5)
nRBC: 0 % (ref 0.0–0.2)

## 2019-03-19 LAB — COMPREHENSIVE METABOLIC PANEL
ALT: 10 U/L (ref 0–44)
AST: 10 U/L — ABNORMAL LOW (ref 15–41)
Albumin: 2.1 g/dL — ABNORMAL LOW (ref 3.5–5.0)
Alkaline Phosphatase: 69 U/L (ref 38–126)
Anion gap: 20 — ABNORMAL HIGH (ref 5–15)
BUN: 78 mg/dL — ABNORMAL HIGH (ref 6–20)
CO2: 13 mmol/L — ABNORMAL LOW (ref 22–32)
Calcium: 8.4 mg/dL — ABNORMAL LOW (ref 8.9–10.3)
Chloride: 95 mmol/L — ABNORMAL LOW (ref 98–111)
Creatinine, Ser: 13.93 mg/dL — ABNORMAL HIGH (ref 0.61–1.24)
GFR calc Af Amer: 4 mL/min — ABNORMAL LOW (ref >60)
GFR calc non Af Amer: 4 mL/min — ABNORMAL LOW (ref >60)
Glucose, Bld: 321 mg/dL — ABNORMAL HIGH (ref 70–99)
Potassium: 4.8 mmol/L (ref 3.5–5.1)
Sodium: 128 mmol/L — ABNORMAL LOW (ref 135–145)
Total Bilirubin: 0.7 mg/dL (ref 0.3–1.2)
Total Protein: 7.5 g/dL (ref 6.5–8.1)

## 2019-03-19 LAB — LACTIC ACID, PLASMA
Lactic Acid, Venous: 2.4 mmol/L (ref 0.5–1.9)
Lactic Acid, Venous: 1.4 mmol/L (ref 0.5–1.9)

## 2019-03-19 LAB — GLUCOSE, CAPILLARY: Glucose-Capillary: 293 mg/dL — ABNORMAL HIGH (ref 70–99)

## 2019-03-19 IMAGING — CR DG FOOT COMPLETE 3+V*R*
3 series · 3 of 3 positions shown · non-contrast
Comparison: None.

CLINICAL DATA: Infection. History of diabetes.

EXAM:
RIGHT FOOT COMPLETE - 3+ VIEW

[foot ap]
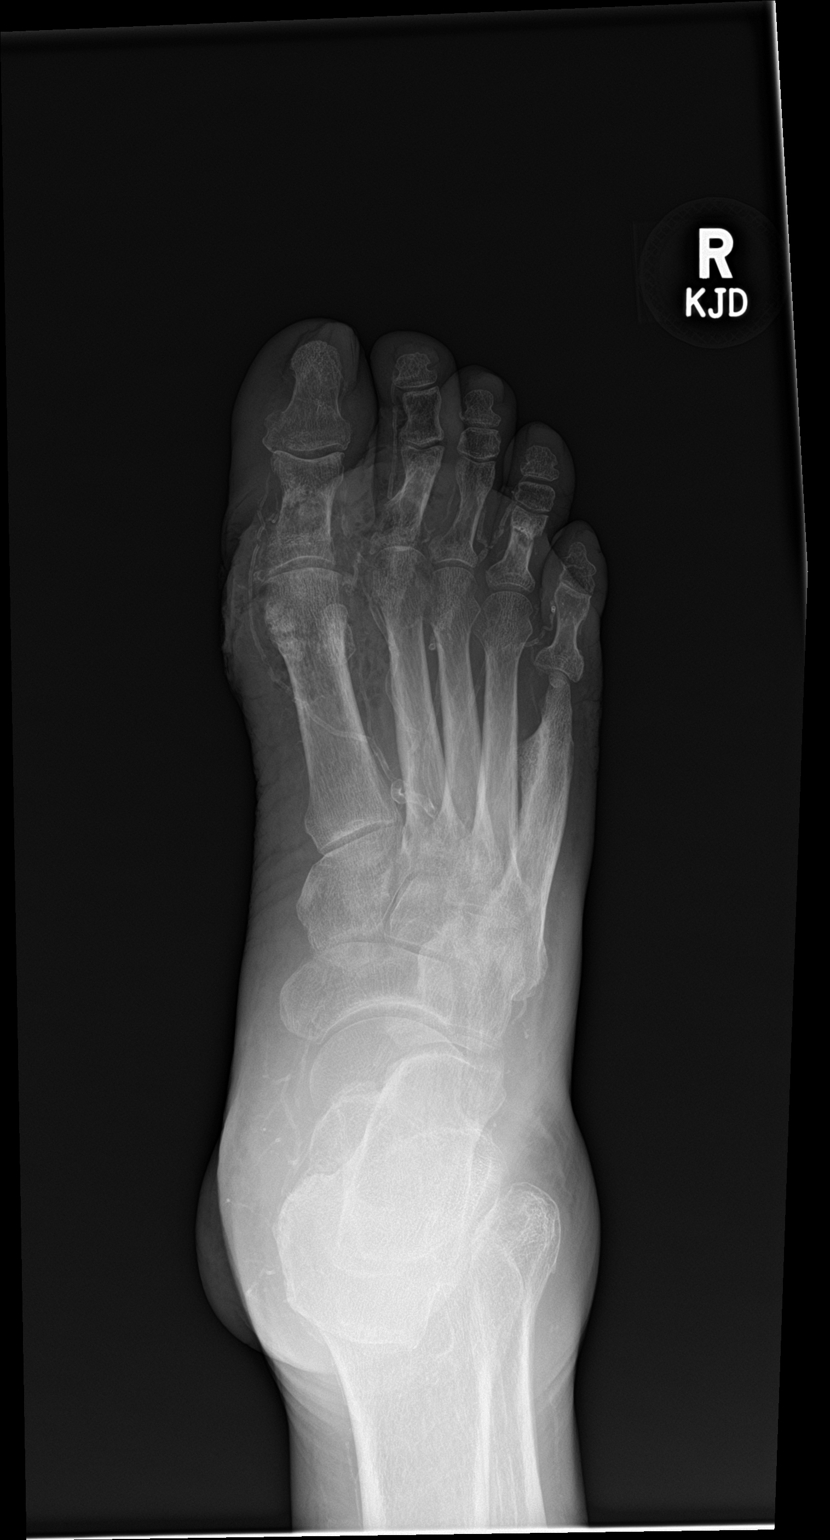

[foot obl]
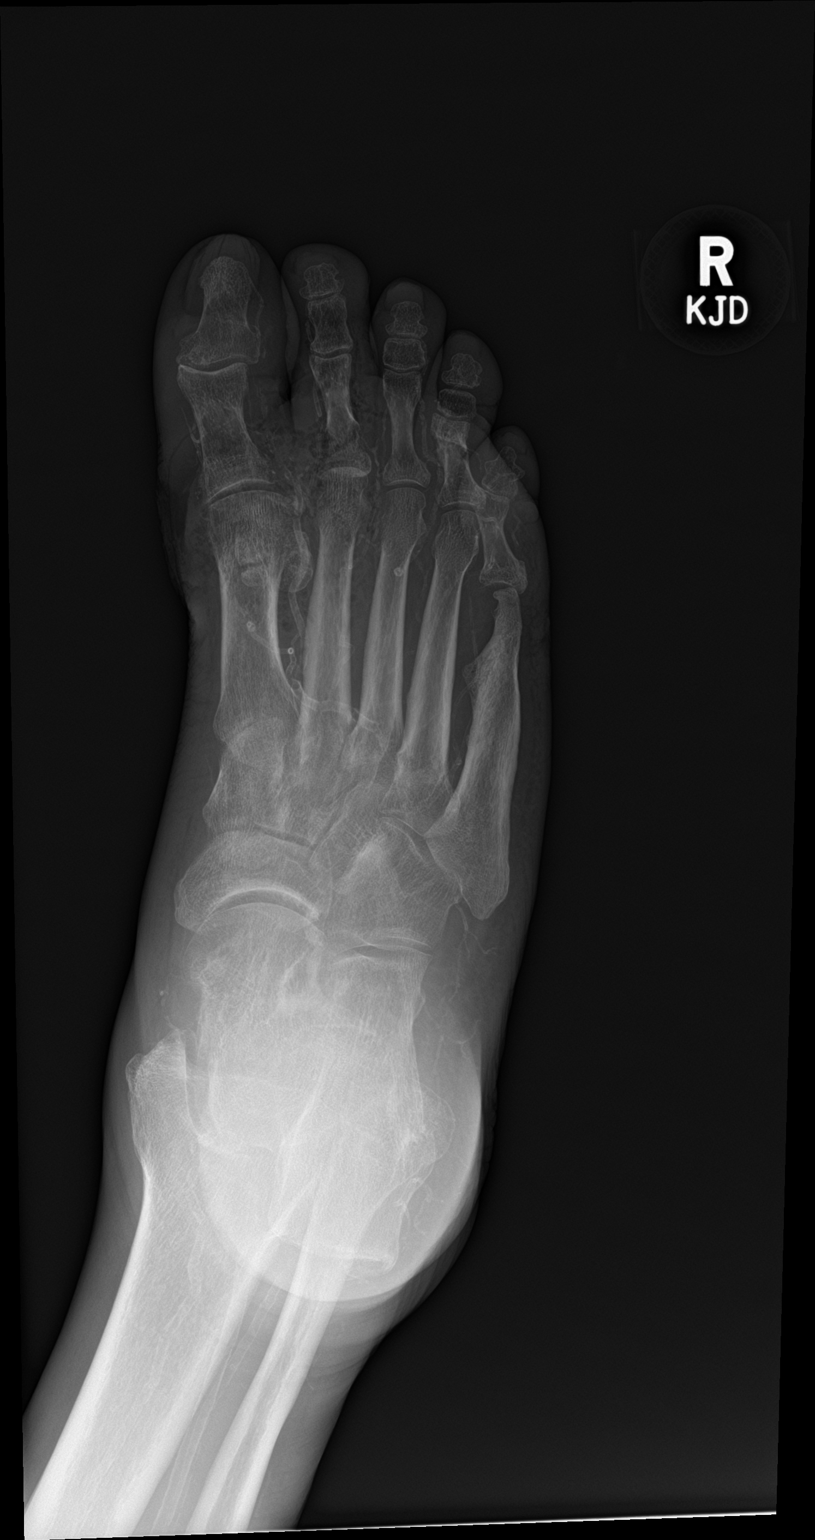

[foot lat]
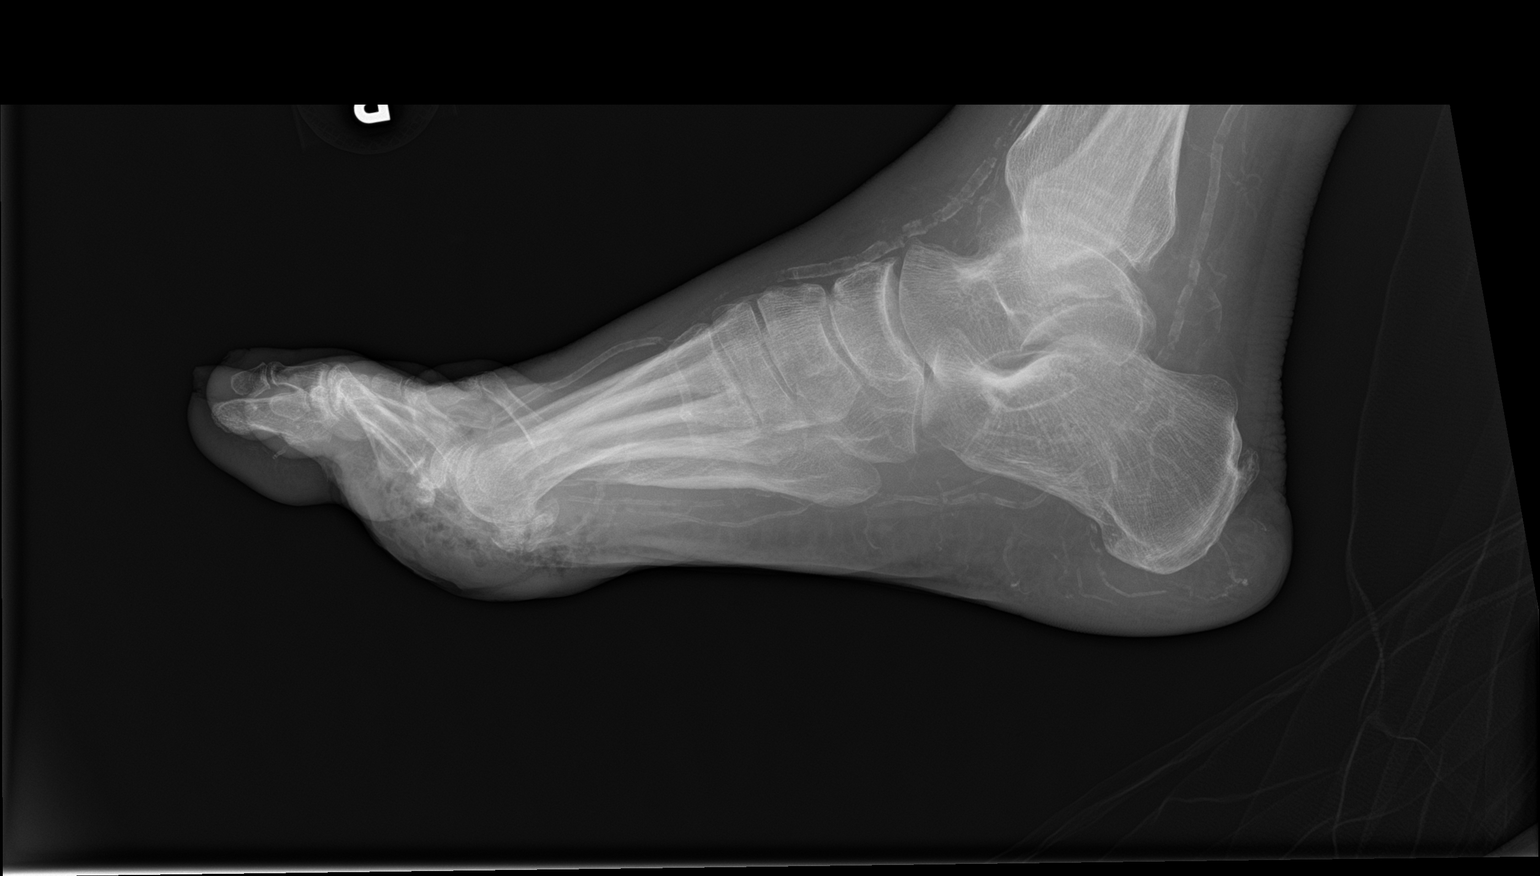

[3 of 3 positions shown; findings below may reference images not displayed]

FINDINGS: Mild bowel gas projects over the first and second metatarsal
phalangeal joints suspicious for gangrene. Erosions in bony
destruction about the second toe proximal phalanx. Suspected erosion
about the great toe proximal phalanx, partially obscured by
overlying air lucencies. Decreased density of the third metatarsal
head which is nonspecific. Deformity of the distal fifth metatarsal
may be postsurgical or remote injury/infection. Advanced vascular
calcifications. No radiopaque foreign bodies.
IMPRESSION: 1. Soft tissue gas projects over the first and second
metatarsophalangeal joints suspicious for gangrene.
2. Erosions and bony destruction about the second toe proximal
phalanx and possibly great toe proximal phalanx, suspicious for
osteomyelitis. Nonspecific decreased density of the medial third
metatarsal head.
3. Advanced vascular calcifications.

## 2019-03-19 IMAGING — CR DG FOOT COMPLETE 3+V*L*
3 series · 3 of 3 positions shown · non-contrast
Comparison: None.

CLINICAL DATA: Infection. History of diabetes.

EXAM:
LEFT FOOT - COMPLETE 3+ VIEW

[foot ap]
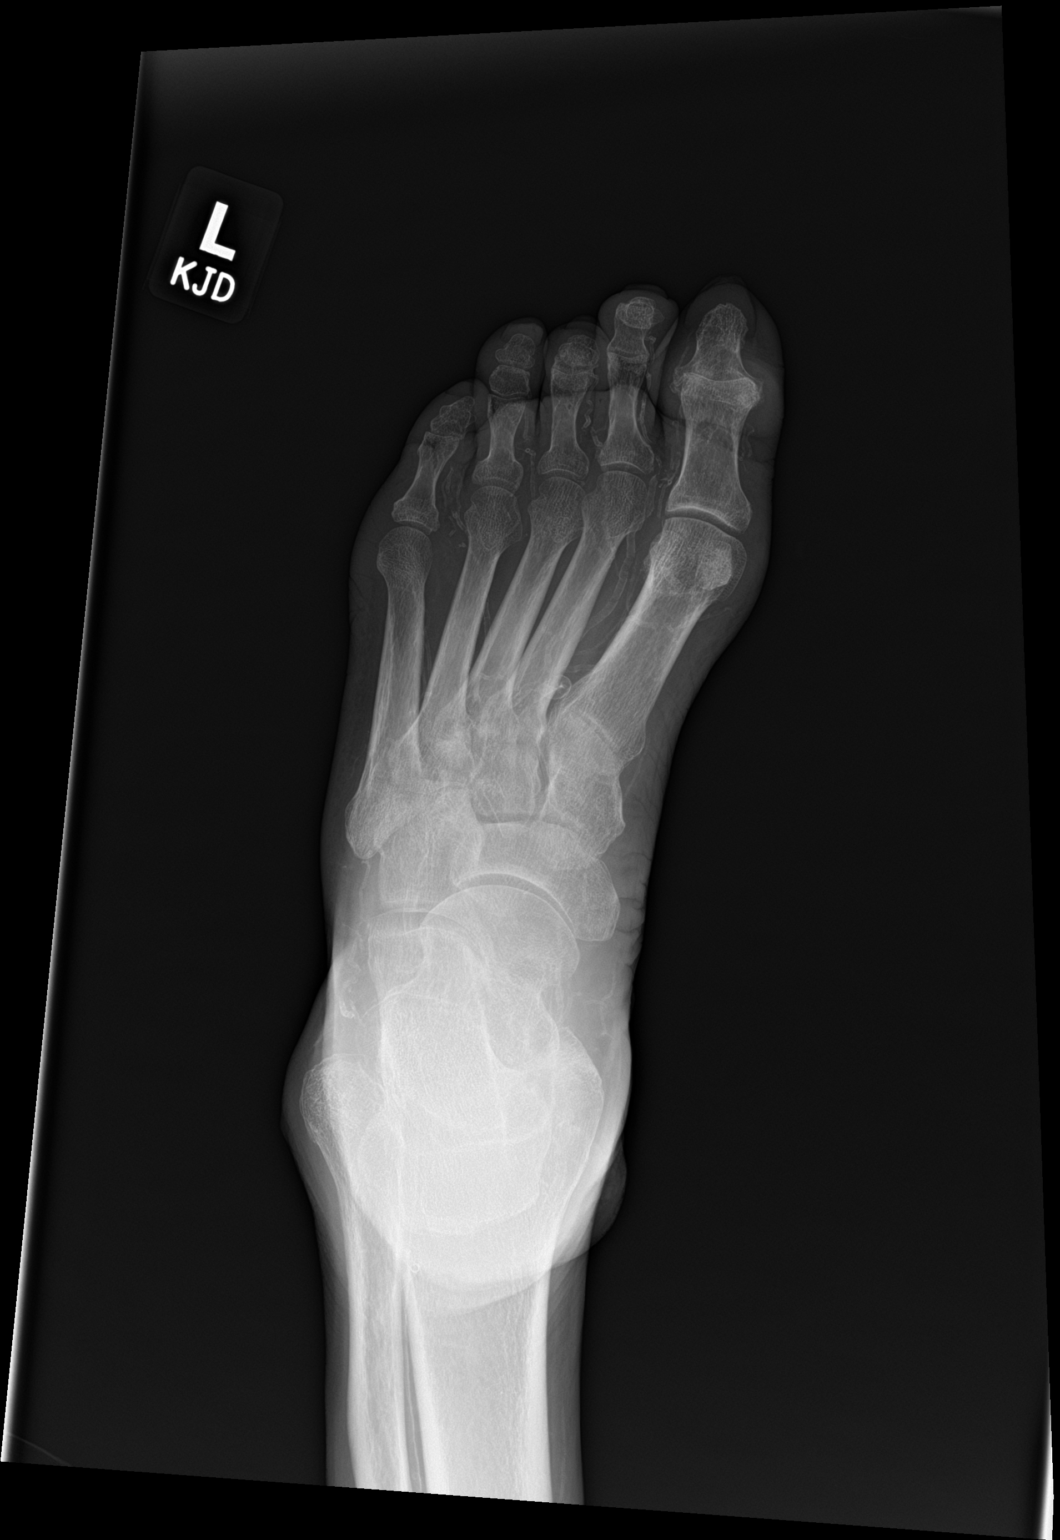

[foot obl]
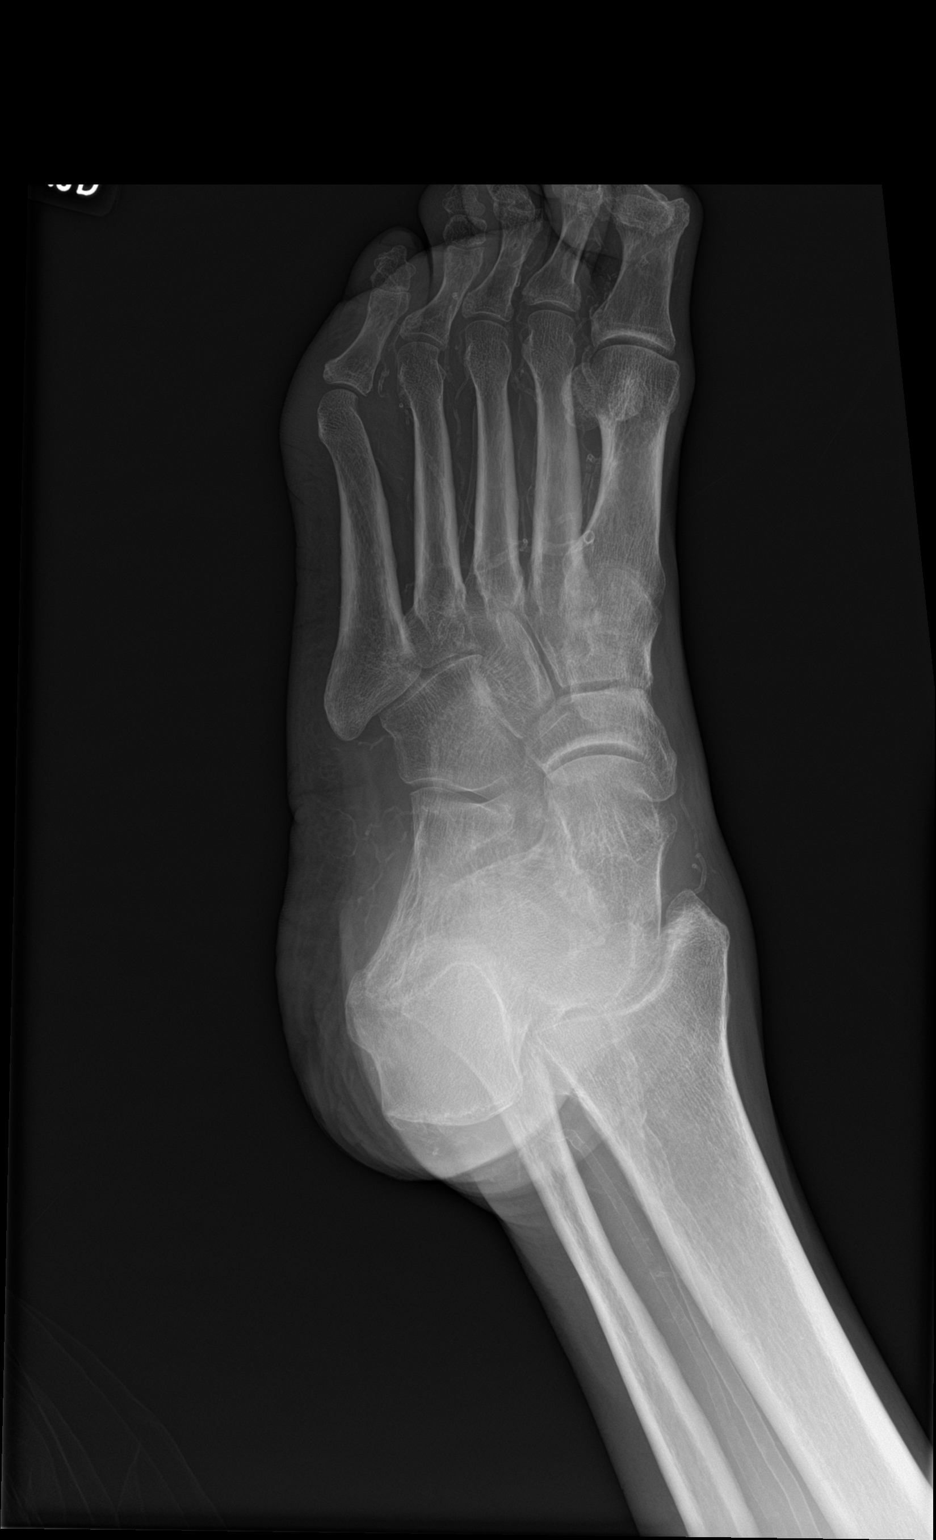

[foot lat]
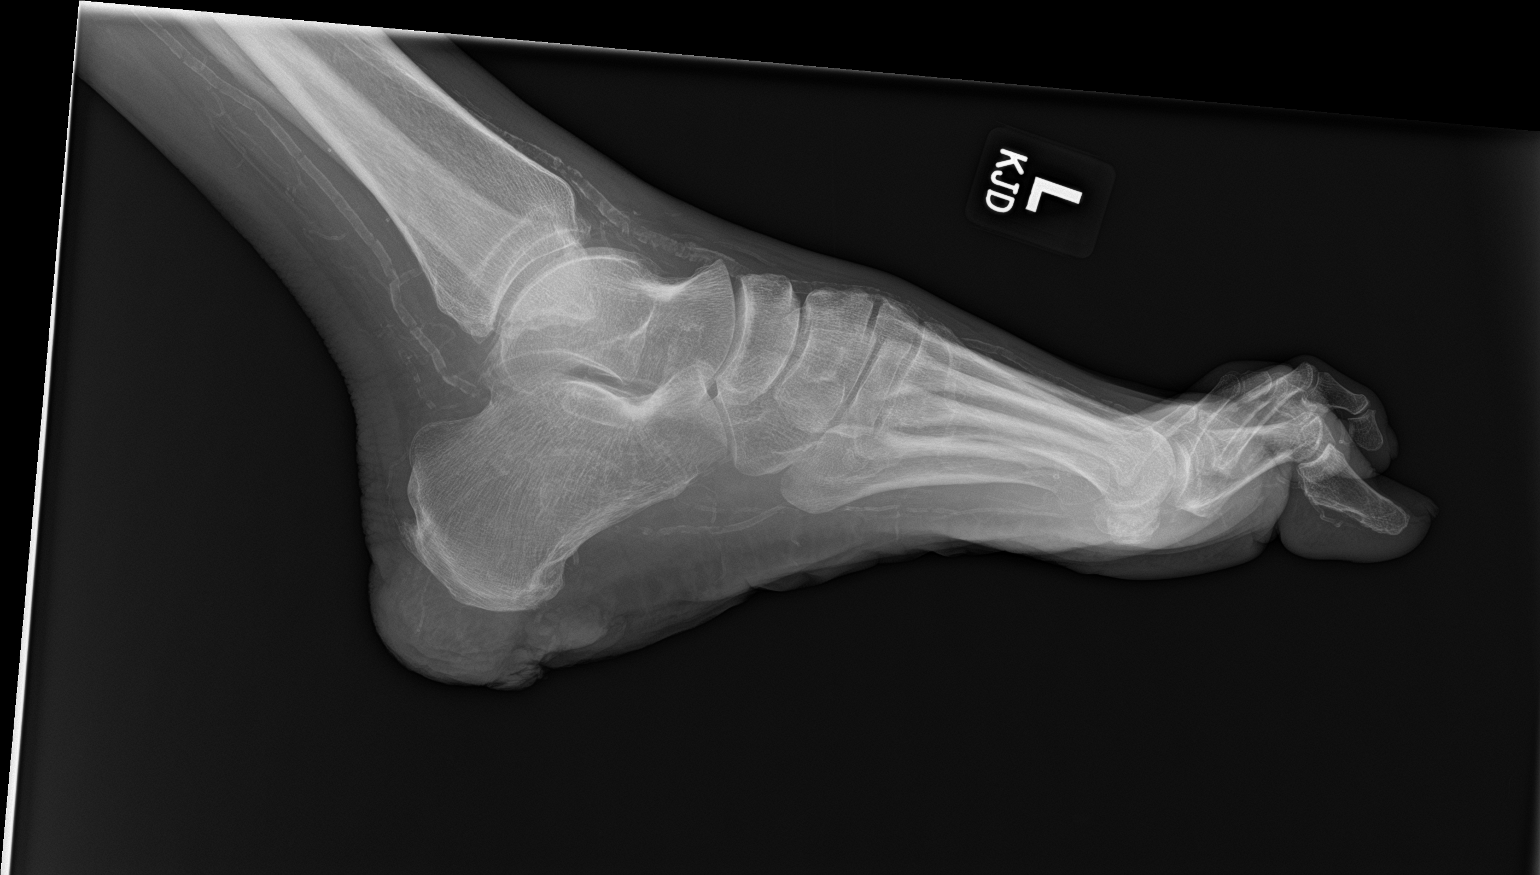

[3 of 3 positions shown; findings below may reference images not displayed]

FINDINGS: Soft tissue defect subjacent to the plantar calcaneus. No associated
bony destruction or periosteal reaction. No other sites suspicious
for osteomyelitis. Hammertoe deformity of the digits limits
assessment. No evidence of fracture. Advanced vascular
calcifications.
IMPRESSION: Soft tissue defect subjacent to the plantar calcaneus consistent
with ulceration. No radiographic evidence of osteomyelitis.

## 2019-03-19 MED ORDER — SODIUM CHLORIDE 0.9% FLUSH
3.0000 mL | Freq: Two times a day (BID) | INTRAVENOUS | Status: DC
  Administered 2019-03-20 – 2019-03-31 (×4): 3 mL via INTRAVENOUS

## 2019-03-19 MED ORDER — PIPERACILLIN-TAZOBACTAM 3.375 G IVPB 30 MIN: 3.3750 g | Freq: Once | INTRAVENOUS | Status: DC

## 2019-03-19 MED ORDER — PIPERACILLIN-TAZOBACTAM IN DEX 2-0.25 GM/50ML IV SOLN
2.2500 g | Freq: Three times a day (TID) | INTRAVENOUS | Status: DC
  Administered 2019-03-19 – 2019-03-26 (×18): 2.25 g via INTRAVENOUS
  Filled 2019-03-19 (×21): qty 50

## 2019-03-19 MED ORDER — SEVELAMER CARBONATE 800 MG PO TABS
2400.0000 mg | ORAL_TABLET | Freq: Three times a day (TID) | ORAL | Status: DC
  Administered 2019-03-20 – 2019-03-31 (×34): 2400 mg via ORAL
  Filled 2019-03-19 (×34): qty 3

## 2019-03-19 MED ORDER — SODIUM CHLORIDE 0.9% FLUSH
3.0000 mL | Freq: Two times a day (BID) | INTRAVENOUS | Status: DC
  Administered 2019-03-20 – 2019-03-31 (×15): 3 mL via INTRAVENOUS

## 2019-03-19 MED ORDER — INSULIN ASPART 100 UNIT/ML ~~LOC~~ SOLN
0.0000 [IU] | SUBCUTANEOUS | Status: DC
  Administered 2019-03-20: 2 [IU] via SUBCUTANEOUS
  Administered 2019-03-20: 3 [IU] via SUBCUTANEOUS
  Administered 2019-03-20: 1 [IU] via SUBCUTANEOUS
  Administered 2019-03-20: 2 [IU] via SUBCUTANEOUS
  Administered 2019-03-21: 1 [IU] via SUBCUTANEOUS
  Administered 2019-03-21: 2 [IU] via SUBCUTANEOUS
  Administered 2019-03-21: 3 [IU] via SUBCUTANEOUS
  Administered 2019-03-21: 4 [IU] via SUBCUTANEOUS
  Administered 2019-03-21: 3 [IU] via SUBCUTANEOUS
  Administered 2019-03-21: 1 [IU] via SUBCUTANEOUS
  Administered 2019-03-22: 4 [IU] via SUBCUTANEOUS
  Administered 2019-03-22: 2 [IU] via SUBCUTANEOUS
  Administered 2019-03-22: 1 [IU] via SUBCUTANEOUS
  Administered 2019-03-22 – 2019-03-23 (×2): 2 [IU] via SUBCUTANEOUS
  Administered 2019-03-23 (×4): 1 [IU] via SUBCUTANEOUS
  Administered 2019-03-24 (×2): 2 [IU] via SUBCUTANEOUS

## 2019-03-19 MED ORDER — CLONIDINE HCL 0.1 MG PO TABS: 0.1000 mg | ORAL_TABLET | Freq: Every day | ORAL | Status: DC

## 2019-03-19 MED ORDER — HYDRALAZINE HCL 50 MG PO TABS: 150.0000 mg | ORAL_TABLET | Freq: Two times a day (BID) | ORAL | Status: DC

## 2019-03-19 MED ORDER — SODIUM CHLORIDE 0.9% FLUSH: 3.0000 mL | INTRAVENOUS | Status: DC | PRN

## 2019-03-19 MED ORDER — LABETALOL HCL 5 MG/ML IV SOLN
10.0000 mg | INTRAVENOUS | Status: DC | PRN
  Administered 2019-03-20: 10 mg via INTRAVENOUS
  Filled 2019-03-19: qty 4

## 2019-03-19 MED ORDER — VANCOMYCIN HCL 1750 MG/350ML IV SOLN
1750.0000 mg | Freq: Once | INTRAVENOUS | Status: DC
  Filled 2019-03-19: qty 350

## 2019-03-19 MED ORDER — ONDANSETRON HCL 4 MG PO TABS: 4.0000 mg | ORAL_TABLET | Freq: Four times a day (QID) | ORAL | Status: DC | PRN

## 2019-03-19 MED ORDER — FENTANYL CITRATE (PF) 100 MCG/2ML IJ SOLN: 12.5000 ug | INTRAMUSCULAR | Status: DC | PRN

## 2019-03-19 MED ORDER — ACETAMINOPHEN 650 MG RE SUPP: 650.0000 mg | Freq: Four times a day (QID) | RECTAL | Status: DC | PRN

## 2019-03-19 MED ORDER — ACETAMINOPHEN 325 MG PO TABS: 650.0000 mg | ORAL_TABLET | Freq: Four times a day (QID) | ORAL | Status: DC | PRN

## 2019-03-19 MED ORDER — ONDANSETRON HCL 4 MG/2ML IJ SOLN: 4.0000 mg | Freq: Four times a day (QID) | INTRAMUSCULAR | Status: DC | PRN

## 2019-03-19 MED ORDER — AMLODIPINE BESYLATE 10 MG PO TABS: 10.0000 mg | ORAL_TABLET | Freq: Every day | ORAL | Status: DC

## 2019-03-19 MED ORDER — ATORVASTATIN CALCIUM 10 MG PO TABS
20.0000 mg | ORAL_TABLET | Freq: Every day | ORAL | Status: DC
  Administered 2019-03-20 – 2019-03-30 (×11): 20 mg via ORAL
  Filled 2019-03-19 (×11): qty 2

## 2019-03-19 MED ORDER — SODIUM BICARBONATE 650 MG PO TABS: 650.0000 mg | ORAL_TABLET | Freq: Three times a day (TID) | ORAL | Status: DC

## 2019-03-19 MED ORDER — FUROSEMIDE 40 MG PO TABS
80.0000 mg | ORAL_TABLET | Freq: Every day | ORAL | Status: DC
  Administered 2019-03-20 – 2019-03-23 (×4): 80 mg via ORAL
  Filled 2019-03-19 (×5): qty 2

## 2019-03-19 MED ORDER — SODIUM CHLORIDE 0.9 % IV SOLN: 250.0000 mL | INTRAVENOUS | Status: DC | PRN

## 2019-03-19 MED ORDER — VANCOMYCIN HCL 1750 MG/350ML IV SOLN
1750.0000 mg | Freq: Once | INTRAVENOUS | Status: AC
  Administered 2019-03-19: 1750 mg via INTRAVENOUS
  Filled 2019-03-19: qty 350

## 2019-03-19 NOTE — ED Triage Notes (Signed)
Charge nurse notified.

## 2019-03-19 NOTE — H&P (Signed)
History and Physical    [name] EVO:[id number] DOB: 01-08-1971 DOA: 03/19/2019  PCP: Patient, No Pcp Per   Patient coming from: Home   Chief Complaint: Discoloration and ulceration of feet, drainage from right foot   HPI: Jorge [name] is a 49 y.o. male with medical history significant for ESRD on peritoneal dialysis, hypertension, insulin-dependent diabetes mellitus, and anemia, now presenting to emergency department for evaluation of swelling, drainage, and following up from his right foot.  Patient reports that he has had an ulcer at the plantar aspect of his left foot for at least a couple months and developed ulceration involving the right foot approximately 2 weeks ago.  The right foot has developed some swelling with foul odor and drainage over the past week or so.  He denies any fevers or chills, denies pain, denies any shortness of breath, cough, or sore throat.  Reports that he recently went to the area, is established with a nephrologist, but does not have a PCP yet.  ED Course: Upon arrival to the ED, patient is found to be afebrile, saturating well on room air, and stable pressure.  Chemistry panel notable for glucose of 321, BUN 78, bicarbonate 13, anion gap 20, and albumin 2.1.  CBC features a leukocytosis to 16,200 and normocytic anemia: 7.7, down from 10.3 last month.  Lactic acid was elevated to 2.4 initially.  Fecal occult blood testing was negative.  Blood cultures were collected in the emergency department, broad-spectrum antibiotics were started, and orthopedic surgeon and nephrologist were consulted by the ED physician.  COVID-19 screening test has not yet resulted.  Review of Systems:  All other systems reviewed and apart from HPI, are negative.  Past Medical History:  Diagnosis Date  . Diabetes mellitus without complication ([place])   . Hypertension   . Peritoneal dialysis status ([place])   . Renal disorder     Past Surgical History:  Procedure Laterality Date  .  FOOT SURGERY Left   . KNEE SURGERY Left      reports that he has never smoked. He has never used smokeless tobacco. He reports that he does not drink alcohol or use drugs.  No Known Allergies  History reviewed. No pertinent family history.   Prior to Admission medications   Medication Sig Start Date End Date Taking? Authorizing Provider  amLODipine (NORVASC) 10 MG tablet Take 10 mg by mouth daily.   Yes [provider]  aspirin EC 81 MG tablet Take 81 mg by mouth daily.   Yes [provider]  atorvastatin (LIPITOR) 20 MG tablet Take 20 mg by mouth daily.   Yes [provider]  cloNIDine (CATAPRES) 0.1 MG tablet Take 0.1 mg by mouth at bedtime.   Yes [provider]  ergocalciferol (VITAMIN D2) 1.25 MG (50000 UT) capsule Take 50,000 Units by mouth once a week.   Yes [provider]  furosemide (LASIX) 80 MG tablet Take 80 mg by mouth daily.   Yes [provider]  hydrALAZINE (APRESOLINE) 100 MG tablet Take 150 mg by mouth 2 (two) times daily.   Yes [provider]  insulin glargine (LANTUS) 100 UNIT/ML injection Inject 10 Units into the skin at bedtime.   Yes [provider]  insulin regular (NOVOLIN R,HUMULIN R) 100 units/mL injection Inject 3-13 Units into the skin 3 (three) times daily as needed for high blood sugar (per sliding scale).   Yes [provider]  metoCLOPramide (REGLAN) 5 MG tablet Take 5 mg  by mouth every 6 (six) hours as needed for nausea.   Yes [provider]  RENVELA 800 MG tablet Take 2,400 mg by mouth 3 (three) times daily. 05/10/17  Yes [provider]  sodium bicarbonate 650 MG tablet Take 650 mg by mouth 3 (three) times daily.   Yes [provider]  bisacodyl (DULCOLAX) 10 MG suppository Place 1 suppository (10 mg total) rectally as needed for moderate constipation. Patient not taking: Reported on 03/19/2019 06/13/17   Hayden Rasmussen, MD  docusate sodium  (COLACE) 100 MG capsule Take 1 capsule (100 mg total) by mouth every 12 (twelve) hours. Patient not taking: Reported on 03/19/2019 06/13/17   Hayden Rasmussen, MD  ondansetron (ZOFRAN) 4 MG tablet Take 1 tablet (4 mg total) by mouth every 6 (six) hours. Patient not taking: Reported on 03/19/2019 06/13/17   Hayden Rasmussen, MD    Physical Exam: Vitals:   03/19/19 2015 03/19/19 2115 03/19/19 2143 03/19/19 2240  BP: (!) 142/84 (!) 152/46 (!) 157/66 (!) 140/47  Pulse: 72 70 71 76  Resp:   18 18  Temp:    98.7 F (37.1 C)  TempSrc:    Oral  SpO2: 99% 100% 100% 100%  Weight:      Height:        Constitutional: NAD, calm, frail-appearing   Eyes: PERTLA, lids and conjunctivae normal ENMT: Mucous membranes are moist. Posterior pharynx clear of any exudate or lesions.   Neck: normal, supple, no masses, no thyromegaly Respiratory: no wheezing, no crackles. No accessory muscle use.  Cardiovascular: S1 & S2 heard, regular rate and rhythm. No extremity edema. No significant JVD. Abdomen: No distension, no tenderness, soft. Bowel sounds active.  Musculoskeletal: no clubbing / cyanosis. No joint deformity upper and lower extremities.   Skin: Atrophy and ulceration of plantar left foot. Atrophy, ulcerations, foul odor, mild edema involving right forefoot. Warm, dry, well-perfused. Neurologic: No facial asymmetry. Sensation to light touch diminished in distal LEs. Moving all extremities.  Psychiatric: Alert and oriented x 3. Pleasant and cooperative.    Labs and Imaging on Admission: I have personally reviewed following labs and imaging studies  CBC: Recent Labs  Lab 03/19/19 1541  WBC 16.2*  NEUTROABS 10.5*  HGB 7.7*  HCT 25.8*  MCV 99.6  PLT 952   Basic Metabolic Panel: Recent Labs  Lab 03/19/19 1541  NA 128*  K 4.8  CL 95*  CO2 13*  GLUCOSE 321*  BUN 78*  CREATININE 13.93*  CALCIUM 8.4*   GFR: Estimated Creatinine Clearance: 6.8 mL/min (A) (by C-G formula based on SCr of  13.93 mg/dL (H)). Liver Function Tests: Recent Labs  Lab 03/19/19 1541  AST 10*  ALT 10  ALKPHOS 69  BILITOT 0.7  PROT 7.5  ALBUMIN 2.1*   No results for input(s): LIPASE, AMYLASE in the last 168 hours. No results for input(s): AMMONIA in the last 168 hours. Coagulation Profile: No results for input(s): INR, PROTIME in the last 168 hours. Cardiac Enzymes: No results for input(s): CKTOTAL, CKMB, CKMBINDEX, TROPONINI in the last 168 hours. BNP (last 3 results) No results for input(s): PROBNP in the last 8760 hours. HbA1C: No results for input(s): HGBA1C in the last 72 hours. CBG: Recent Labs  Lab 03/19/19 2245  GLUCAP 293*   Lipid Profile: No results for input(s): CHOL, HDL, LDLCALC, TRIG, CHOLHDL, LDLDIRECT in the last 72 hours. Thyroid Function Tests: No results for input(s): TSH, T4TOTAL, FREET4, T3FREE, THYROIDAB in the last 72  hours. Anemia Panel: No results for input(s): VITAMINB12, FOLATE, FERRITIN, TIBC, IRON, RETICCTPCT in the last 72 hours. Urine analysis:    Component Value Date/Time   COLORURINE YELLOW 06/13/2017 2015   APPEARANCEUR HAZY (A) 06/13/2017 2015   LABSPEC 1.015 06/13/2017 2015   PHURINE 5.0 06/13/2017 2015   GLUCOSEU >=500 (A) 06/13/2017 2015   HGBUR NEGATIVE 06/13/2017 2015   BILIRUBINUR NEGATIVE 06/13/2017 2015   KETONESUR 5 (A) 06/13/2017 2015   PROTEINUR 100 (A) 06/13/2017 2015   NITRITE NEGATIVE 06/13/2017 2015   LEUKOCYTESUR NEGATIVE 06/13/2017 2015   Sepsis Labs: @LABRCNTIP (procalcitonin:4,lacticidven:4) )No results found for this or any previous visit (from the past 240 hour(s)).   Radiological Exams on Admission: DG Foot Complete Left  Result Date: 03/19/2019 CLINICAL DATA:  Infection. History of diabetes. EXAM: LEFT FOOT - COMPLETE 3+ VIEW COMPARISON:  None. FINDINGS: Soft tissue defect subjacent to the plantar calcaneus. No associated bony destruction or periosteal reaction. No other sites suspicious for osteomyelitis. Hammertoe  deformity of the digits limits assessment. No evidence of fracture. Advanced vascular calcifications. IMPRESSION: Soft tissue defect subjacent to the plantar calcaneus consistent with ulceration. No radiographic evidence of osteomyelitis. Electronically Signed   By: Keith Rake M.D.   On: 03/19/2019 20:45   DG Foot Complete Right  Result Date: 03/19/2019 CLINICAL DATA:  Infection. History of diabetes. EXAM: RIGHT FOOT COMPLETE - 3+ VIEW COMPARISON:  None. FINDINGS: Mild bowel gas projects over the first and second metatarsal phalangeal joints suspicious for gangrene. Erosions in bony destruction about the second toe proximal phalanx. Suspected erosion about the great toe proximal phalanx, partially obscured by overlying air lucencies. Decreased density of the third metatarsal head which is nonspecific. Deformity of the distal fifth metatarsal may be postsurgical or remote injury/infection. Advanced vascular calcifications. No radiopaque foreign bodies. IMPRESSION: 1. Soft tissue gas projects over the first and second metatarsophalangeal joints suspicious for gangrene. 2. Erosions and bony destruction about the second toe proximal phalanx and possibly great toe proximal phalanx, suspicious for osteomyelitis. Nonspecific decreased density of the medial third metatarsal head. 3. Advanced vascular calcifications. Electronically Signed   By: Keith Rake M.D.   On: 03/19/2019 20:44    Assessment/Plan   1. Diabetic foot infection  - Presents with discoloration and ulceration involving bilateral feet and some swelling, drainage, and foul odor from right foot  - He has leukocytosis, mild elevation in lactate, evidence for osteomyelitis on plain films, and stable BP  - Blood cultures collected in ED, orthopedic surgery was consulted, and broad-spectrum abx started  - Continue antibiotics, check ABIs, trend inflammatory markers, keep NPO pending plan from surgery    2. ESRD  - Patient does PD qHS at  home  - Nephrology was contacted by ED physician  - Renally-dose medications, SLIV, continue Lasix and bicarbonate   3. Anemia  - Hgb is 7.7 on admission without bleeding  - Check anemia panel, type and screen    4. Insulin-dependent DM  - No recent A1c in chart  - He is NPO, will check CBGs and use low-intensity SSI to start    5. Hypertension  - Treat as-needed only for now as DBP has been low in ED and he is at risk for developing sepsis   DVT prophylaxis: SCDs  Code Status: Full  Family Communication: Discussed with patient  Disposition Plan: May need SNF once cleared by orthopedic surgery depending on course,  Consults called: orthopedic surgery and nephrology consulted by ED physician  Admission status:  Inpatient     Vianne Bulls, MD [facility] Pager: See [email or web address]  If 7AM-7PM, please contact the daytime attending [email or web address]  03/19/2019, 11:47 PM

## 2019-03-19 NOTE — Progress Notes (Signed)
Pharmacy Antibiotic Note  Jorge [name] is a 49 y.o. male admitted on 03/19/2019 with Wound infection.  Pharmacy has been consulted for Zosyn and Vancomycin dosing.  Height: 5\' 11"  (180.3 cm) Weight: 176 lb 5.9 oz (80 kg) IBW/kg (Calculated) : 75.3  Temp (24hrs), Avg:98.2 F (36.8 C), Min:98.2 F (36.8 C), Max:98.2 F (36.8 C)  Recent Labs  Lab 03/19/19 1541  WBC 16.2*  CREATININE 13.93*  LATICACIDVEN 2.4*    Estimated Creatinine Clearance: 6.8 mL/min (A) (by C-G formula based on SCr of 13.93 mg/dL (H)).    No Known Allergies  Antimicrobials this admission: 3/11 Zsoyn >>  3/11 Vancomycin >>   Dose adjustments this admission:   Microbiology results: Pending   Plan:  - Patient is currently on intermittent Peritoneal Dialysis  - Zosyn 2.25g IV q8hr  - Vancomycin 1750mg  IV x 1 dose - Will dose vancomycin based on levels  Thank you for allowing pharmacy to be a part of this patient's care.  Duanne Limerick PharmD. BCPS  03/19/2019 8:36 PM

## 2019-03-19 NOTE — ED Triage Notes (Signed)
Lactic Acid.- 2.4

## 2019-03-19 NOTE — ED Provider Notes (Signed)
[facility] EMERGENCY DEPARTMENT Provider Note   CSN: [id number] Arrival date & time: 03/19/19  1523     History Chief Complaint  Patient presents with  . Wound Infection    Jorge [name] is a 49 y.o. male with a past medical history of diabetes, hypertension, who completes peritoneal dialysis daily with last session last night presenting to the ED for concerns for wound infection.  States that he has had a diabetic ulcer in the bottom of his left foot for the past 5 to 6 months although noticed the ulcer at the bottom of his right foot for the past 1 to 2 weeks.  He saw his PCP yesterday and was told to come to the ER for concerns for wound infection of bilateral feet.  Has been noting chills but denies any documented fever.  Has not been on antibiotics for these infections in the past.  He was told yesterday by his PCP that he was anemic and was given "a shot of something to help get my blood levels up."  Notes history of similar anemia in the past with several negative Hemoccult test and no history of GI bleed in the past.  He was told by his PCP that it was due to his wounds.  He denies any shortness of breath, lightheadedness, chest pain, vomiting, diarrhea.  HPI     Past Medical History:  Diagnosis Date  . Diabetes mellitus without complication ([place])   . Hypertension   . Peritoneal dialysis status ([place])   . Renal disorder     There are no problems to display for this patient.   Past Surgical History:  Procedure Laterality Date  . FOOT SURGERY Left   . KNEE SURGERY Left        No family history on file.  Social History   Tobacco Use  . Smoking status: Never Smoker  . Smokeless tobacco: Never Used  Substance Use Topics  . Alcohol use: Never  . Drug use: Never    Home Medications Prior to Admission medications   Medication Sig Start Date End Date Taking? Authorizing Provider  amLODipine (NORVASC) 10 MG tablet Take 10 mg by mouth daily.     [provider]  bisacodyl (DULCOLAX) 10 MG suppository Place 1 suppository (10 mg total) rectally as needed for moderate constipation. 06/13/17   Hayden Rasmussen, MD  cloNIDine (CATAPRES) 0.1 MG tablet Take 0.1 mg by mouth at bedtime.    [provider]  docusate sodium (COLACE) 100 MG capsule Take 1 capsule (100 mg total) by mouth every 12 (twelve) hours. 06/13/17   Hayden Rasmussen, MD  hydrALAZINE (APRESOLINE) 100 MG tablet Take 150 mg by mouth 2 (two) times daily.    [provider]  insulin glargine (LANTUS) 100 UNIT/ML injection Inject 10 Units into the skin at bedtime.    [provider]  insulin regular (NOVOLIN R,HUMULIN R) 100 units/mL injection Inject 3-13 Units into the skin 3 (three) times daily as needed for high blood sugar (per sliding scale).    [provider]  ondansetron (ZOFRAN) 4 MG tablet Take 1 tablet (4 mg total) by mouth every 6 (six) hours. 06/13/17   Hayden Rasmussen, MD  RENVELA 800 MG tablet Take 2,400 mg by mouth 3 (three) times daily. 05/10/17   [provider]    Allergies    Patient has no known allergies.  Review of Systems   Review of Systems  Constitutional: Positive for  chills. Negative for appetite change and fever.  HENT: Negative for ear pain, rhinorrhea, sneezing and sore throat.   Eyes: Negative for photophobia and visual disturbance.  Respiratory: Negative for cough, chest tightness, shortness of breath and wheezing.   Cardiovascular: Negative for chest pain and palpitations.  Gastrointestinal: Negative for abdominal pain, blood in stool, constipation, diarrhea, nausea and vomiting.  Genitourinary: Negative for dysuria, hematuria and urgency.  Musculoskeletal: Negative for myalgias.  Skin: Positive for wound. Negative for rash.  Neurological: Negative for dizziness, weakness and light-headedness.    Physical Exam Updated Vital Signs BP (!) 145/74   Pulse 74   Temp 98.2 F (36.8 C) (Oral)    Resp 16   Ht 5\' 11"  (1.803 m)   Wt 80 kg   SpO2 99%   BMI 24.60 kg/m   Physical Exam Vitals and nursing note reviewed.  Constitutional:      General: He is not in acute distress.    Appearance: He is well-developed.  HENT:     Head: Normocephalic and atraumatic.     Nose: Nose normal.  Eyes:     General: No scleral icterus.       Right eye: No discharge.        Left eye: No discharge.     Conjunctiva/sclera: Conjunctivae normal.  Cardiovascular:     Rate and Rhythm: Normal rate and regular rhythm.     Heart sounds: Normal heart sounds. No murmur. No friction rub. No gallop.   Pulmonary:     Effort: Pulmonary effort is normal. No respiratory distress.     Breath sounds: Normal breath sounds.  Abdominal:     General: Bowel sounds are normal. There is no distension.     Palpations: Abdomen is soft.     Tenderness: There is no abdominal tenderness. There is no guarding.  Musculoskeletal:        General: Normal range of motion.     Cervical back: Normal range of motion and neck supple.  Skin:    General: Skin is warm and dry.     Findings: Wound present. No rash.     Comments: Necrotic wounds as noted in the images in the bottom of bilateral feet.  2+ DP pulses palpated bilaterally.  Strong foul odor and purulent drainage from right foot wound.  Able to move digits and ankle without difficulty bilaterally.  Neurological:     Mental Status: He is alert.     Motor: No abnormal muscle tone.     Coordination: Coordination normal.         ED Results / Procedures / Treatments   Labs (all labs ordered are listed, but only abnormal results are displayed) Labs Reviewed  LACTIC ACID, PLASMA - Abnormal; Notable for the following components:      Result Value   Lactic Acid, Venous 2.4 (*)    All other components within normal limits  COMPREHENSIVE METABOLIC PANEL - Abnormal; Notable for the following components:   Sodium 128 (*)    Chloride 95 (*)    CO2 13 (*)    Glucose,  Bld 321 (*)    BUN 78 (*)    Creatinine, Ser 13.93 (*)    Calcium 8.4 (*)    Albumin 2.1 (*)    AST 10 (*)    GFR calc non Af Amer 4 (*)    GFR calc Af Amer 4 (*)    Anion gap 20 (*)    All other components within normal  limits  CBC WITH DIFFERENTIAL/PLATELET - Abnormal; Notable for the following components:   WBC 16.2 (*)    RBC 2.59 (*)    Hemoglobin 7.7 (*)    HCT 25.8 (*)    MCHC 29.8 (*)    Neutro Abs 10.5 (*)    Monocytes Absolute 1.7 (*)    Abs Immature Granulocytes 0.15 (*)    All other components within normal limits  CULTURE, BLOOD (ROUTINE X 2)  CULTURE, BLOOD (ROUTINE X 2)  SARS CORONAVIRUS 2 (TAT 6-24 HRS)  LACTIC ACID, PLASMA  POC OCCULT BLOOD, ED    EKG None  Radiology DG Foot Complete Left  Result Date: 03/19/2019 CLINICAL DATA:  Infection. History of diabetes. EXAM: LEFT FOOT - COMPLETE 3+ VIEW COMPARISON:  None. FINDINGS: Soft tissue defect subjacent to the plantar calcaneus. No associated bony destruction or periosteal reaction. No other sites suspicious for osteomyelitis. Hammertoe deformity of the digits limits assessment. No evidence of fracture. Advanced vascular calcifications. IMPRESSION: Soft tissue defect subjacent to the plantar calcaneus consistent with ulceration. No radiographic evidence of osteomyelitis. Electronically Signed   By: Keith Rake M.D.   On: 03/19/2019 20:45   DG Foot Complete Right  Result Date: 03/19/2019 CLINICAL DATA:  Infection. History of diabetes. EXAM: RIGHT FOOT COMPLETE - 3+ VIEW COMPARISON:  None. FINDINGS: Mild bowel gas projects over the first and second metatarsal phalangeal joints suspicious for gangrene. Erosions in bony destruction about the second toe proximal phalanx. Suspected erosion about the great toe proximal phalanx, partially obscured by overlying air lucencies. Decreased density of the third metatarsal head which is nonspecific. Deformity of the distal fifth metatarsal may be postsurgical or remote  injury/infection. Advanced vascular calcifications. No radiopaque foreign bodies. IMPRESSION: 1. Soft tissue gas projects over the first and second metatarsophalangeal joints suspicious for gangrene. 2. Erosions and bony destruction about the second toe proximal phalanx and possibly great toe proximal phalanx, suspicious for osteomyelitis. Nonspecific decreased density of the medial third metatarsal head. 3. Advanced vascular calcifications. Electronically Signed   By: Keith Rake M.D.   On: 03/19/2019 20:44    Procedures .Critical Care Performed by: Delia Heady, PA-C Authorized by: Delia Heady, PA-C   Critical care provider statement:    Critical care time (minutes):  35   Critical care was necessary to treat or prevent imminent or life-threatening deterioration of the following conditions:  Cardiac failure, circulatory failure, CNS failure or compromise, sepsis, renal failure and metabolic crisis   Critical care was time spent personally by me on the following activities:  Development of treatment plan with patient or surrogate, discussions with consultants, evaluation of patient's response to treatment, examination of patient, obtaining history from patient or surrogate, ordering and review of laboratory studies, ordering and performing treatments and interventions, ordering and review of radiographic studies, pulse oximetry, re-evaluation of patient's condition and review of old charts   I assumed direction of critical care for this patient from another provider in my specialty: no     (including critical care time)  Medications Ordered in ED Medications  piperacillin-tazobactam (ZOSYN) IVPB 2.25 g (2.25 g Intravenous New Bag/Given 03/19/19 2100)  vancomycin (VANCOREADY) IVPB 1750 mg/350 mL (has no administration in time range)    ED Course  I have reviewed the triage vital signs and the nursing notes.  Pertinent labs & imaging results that were available during my care of the  patient were reviewed by me and considered in my medical decision making (see chart for details).  Clinical Course as of Mar 19 2103  Thu Mar 19, 2019  2059 Down 3 points from 1 month ago.  Patient denies any bleeding.  Hemoglobin(!): 7.7 [HK]  2059 Lactic Acid, Venous: 1.4 [HK]    Clinical Course User Index [HK] Delia Heady, PA-C   MDM Rules/Calculators/A&P                      49 year old male with a past medical history of ESRD on peritoneal dialysis, diabetes presents to ED with a chief complaint of wound infection.  Reports wounds in bilateral feet.  States that the left foot wound has been present for several months but the right foot wound appeared and has worsened for the past 1 to 2 weeks.  Reports foul-smelling drainage, chills as well.  He saw his PCP today, was given a "shot of something to get my blood levels up" and was told to come to the ER for concerns for wound infection.  He has not been on antibiotics for these wounds recently.  He reports compliance with his peritoneal dialysis and diabetes management.  On exam there is chronic appearing wound at the bottom of the left foot but the wound on the bottom of the right ball of foot appears to be draining purulent matter.  He has normal range of motion of digits and ankle.  Good pulses noted bilaterally.  Initial lactic acid slightly elevated at 2.4 but improved on recheck.  Patient afebrile without recent use of antipyretics.  Leukocytosis of 16.2.  Hemoglobin is 7.7 which is 3 points lower than 1 month.  Patient was told by his PCP that this was due to his wounds and has not had a history of GI bleeding in the past.  Chart review shows that past Hemoccults have all been negative.  Hemoccult today is negative.  Creatinine of 13 here, anion gap of 20, patient states that his last peritoneal dialysis session was yesterday and that he has only missed 1 session in the past 1.5 weeks.  Patient started on vancomycin, Zosyn.  I personally  reviewed and interpreted all imaging and lab work at today's visit.  I have consulted Dr. Mardelle Matte, orthopedics who is aware of the patient as his right foot x-ray does show possible osteomyelitis.    I also consulted Dr. Royce Macadamia nephrology as patient will require peritoneal dialysis.    We will asked hospitalist team to admit.  Final Clinical Impression(s) / ED Diagnoses Final diagnoses:  Wound infection  Osteomyelitis of right foot, unspecified type ([place])  ESRD on peritoneal dialysis [facility])    Rx / DC Orders ED Discharge Orders    None      Portions of this note were generated with Dragon dictation software. Dictation errors may occur despite best attempts at proofreading.    Delia Heady, PA-C 03/19/19 2131    Lucrezia Starch, MD 03/20/19 [phone]

## 2019-03-19 NOTE — Progress Notes (Signed)
Reviewed clinical images, Dr. Sharol Given to see tomorrow am.  Will keep npo in case of surgery tomorrow.   Johnny Bridge, MD

## 2019-03-19 NOTE — ED Triage Notes (Signed)
Pt has a diabetic foot ulcer to each foot. Blood sugars have been running around 250 lately, pt takes insulin. Pt ambulatory upon arrival.

## 2019-03-20 ENCOUNTER — Inpatient Hospital Stay (HOSPITAL_COMMUNITY): Payer: Medicare (Managed Care)

## 2019-03-20 DIAGNOSIS — M869 Osteomyelitis, unspecified: Secondary | ICD-10-CM | POA: Diagnosis present

## 2019-03-20 DIAGNOSIS — I96 Gangrene, not elsewhere classified: Secondary | ICD-10-CM | POA: Diagnosis present

## 2019-03-20 DIAGNOSIS — N186 End stage renal disease: Secondary | ICD-10-CM

## 2019-03-20 DIAGNOSIS — E43 Unspecified severe protein-calorie malnutrition: Secondary | ICD-10-CM | POA: Diagnosis present

## 2019-03-20 DIAGNOSIS — M86271 Subacute osteomyelitis, right ankle and foot: Secondary | ICD-10-CM

## 2019-03-20 DIAGNOSIS — Z992 Dependence on renal dialysis: Secondary | ICD-10-CM

## 2019-03-20 DIAGNOSIS — E11628 Type 2 diabetes mellitus with other skin complications: Secondary | ICD-10-CM

## 2019-03-20 DIAGNOSIS — L089 Local infection of the skin and subcutaneous tissue, unspecified: Secondary | ICD-10-CM

## 2019-03-20 HISTORY — DX: Unspecified severe protein-calorie malnutrition: E43

## 2019-03-20 LAB — RENAL FUNCTION PANEL
Albumin: 2 g/dL — ABNORMAL LOW (ref 3.5–5.0)
Anion gap: 21 — ABNORMAL HIGH (ref 5–15)
BUN: 88 mg/dL — ABNORMAL HIGH (ref 6–20)
CO2: 14 mmol/L — ABNORMAL LOW (ref 22–32)
Calcium: 8.5 mg/dL — ABNORMAL LOW (ref 8.9–10.3)
Chloride: 97 mmol/L — ABNORMAL LOW (ref 98–111)
Creatinine, Ser: 14.95 mg/dL — ABNORMAL HIGH (ref 0.61–1.24)
GFR calc Af Amer: 4 mL/min — ABNORMAL LOW (ref >60)
GFR calc non Af Amer: 3 mL/min — ABNORMAL LOW (ref >60)
Glucose, Bld: 216 mg/dL — ABNORMAL HIGH (ref 70–99)
Phosphorus: 8.4 mg/dL — ABNORMAL HIGH (ref 2.5–4.6)
Potassium: 4.8 mmol/L (ref 3.5–5.1)
Sodium: 132 mmol/L — ABNORMAL LOW (ref 135–145)

## 2019-03-20 LAB — CBC WITH DIFFERENTIAL/PLATELET
Abs Immature Granulocytes: 0.18 10*3/uL — ABNORMAL HIGH (ref 0.00–0.07)
Basophils Absolute: 0 10*3/uL (ref 0.0–0.1)
Basophils Relative: 0 %
Eosinophils Absolute: 0.1 10*3/uL (ref 0.0–0.5)
Eosinophils Relative: 1 %
HCT: 17.1 % — ABNORMAL LOW (ref 39.0–52.0)
Hemoglobin: 5.4 g/dL — CL (ref 13.0–17.0)
Immature Granulocytes: 1 %
Lymphocytes Relative: 12 %
Lymphs Abs: 1.8 10*3/uL (ref 0.7–4.0)
MCH: 29.8 pg (ref 26.0–34.0)
MCHC: 31.6 g/dL (ref 30.0–36.0)
MCV: 94.5 fL (ref 80.0–100.0)
Monocytes Absolute: 1.4 10*3/uL — ABNORMAL HIGH (ref 0.1–1.0)
Monocytes Relative: 9 %
Neutro Abs: 12 10*3/uL — ABNORMAL HIGH (ref 1.7–7.7)
Neutrophils Relative %: 77 %
Platelets: 279 10*3/uL (ref 150–400)
RBC: 1.81 MIL/uL — ABNORMAL LOW (ref 4.22–5.81)
RDW: 13.9 % (ref 11.5–15.5)
WBC: 15.5 10*3/uL — ABNORMAL HIGH (ref 4.0–10.5)
nRBC: 0 % (ref 0.0–0.2)

## 2019-03-20 LAB — C-REACTIVE PROTEIN: CRP: 15.9 mg/dL — ABNORMAL HIGH (ref <1.0)

## 2019-03-20 LAB — HIV ANTIBODY (ROUTINE TESTING W REFLEX): HIV Screen 4th Generation wRfx: NONREACTIVE

## 2019-03-20 LAB — BASIC METABOLIC PANEL
Anion gap: 21 — ABNORMAL HIGH (ref 5–15)
BUN: 82 mg/dL — ABNORMAL HIGH (ref 6–20)
CO2: 13 mmol/L — ABNORMAL LOW (ref 22–32)
Calcium: 8.5 mg/dL — ABNORMAL LOW (ref 8.9–10.3)
Chloride: 95 mmol/L — ABNORMAL LOW (ref 98–111)
Creatinine, Ser: 14.08 mg/dL — ABNORMAL HIGH (ref 0.61–1.24)
GFR calc Af Amer: 4 mL/min — ABNORMAL LOW (ref >60)
GFR calc non Af Amer: 4 mL/min — ABNORMAL LOW (ref >60)
Glucose, Bld: 318 mg/dL — ABNORMAL HIGH (ref 70–99)
Potassium: 5.3 mmol/L — ABNORMAL HIGH (ref 3.5–5.1)
Sodium: 129 mmol/L — ABNORMAL LOW (ref 135–145)

## 2019-03-20 LAB — HEMOGLOBIN AND HEMATOCRIT, BLOOD
HCT: 27.6 % — ABNORMAL LOW (ref 39.0–52.0)
Hemoglobin: 9.1 g/dL — ABNORMAL LOW (ref 13.0–17.0)

## 2019-03-20 LAB — GLUCOSE, CAPILLARY
Glucose-Capillary: 247 mg/dL — ABNORMAL HIGH (ref 70–99)
Glucose-Capillary: 226 mg/dL — ABNORMAL HIGH (ref 70–99)
Glucose-Capillary: 248 mg/dL — ABNORMAL HIGH (ref 70–99)
Glucose-Capillary: 188 mg/dL — ABNORMAL HIGH (ref 70–99)
Glucose-Capillary: 252 mg/dL — ABNORMAL HIGH (ref 70–99)

## 2019-03-20 LAB — SURGICAL PCR SCREEN
MRSA, PCR: NEGATIVE
Staphylococcus aureus: NEGATIVE

## 2019-03-20 LAB — RETICULOCYTES
Immature Retic Fract: 24.8 % — ABNORMAL HIGH (ref 2.3–15.9)
RBC.: 1.88 MIL/uL — ABNORMAL LOW (ref 4.22–5.81)
Retic Count, Absolute: 46.8 10*3/uL (ref 19.0–186.0)
Retic Ct Pct: 2.5 % (ref 0.4–3.1)

## 2019-03-20 LAB — IRON AND TIBC
Iron: 38 ug/dL — ABNORMAL LOW (ref 45–182)
Saturation Ratios: 23 % (ref 17.9–39.5)
TIBC: 162 ug/dL — ABNORMAL LOW (ref 250–450)
UIBC: 124 ug/dL

## 2019-03-20 LAB — FOLATE: Folate: 34.9 ng/mL (ref >5.9)

## 2019-03-20 LAB — VITAMIN B12: Vitamin B-12: 429 pg/mL (ref 180–914)

## 2019-03-20 LAB — ABO/RH: ABO/RH(D): O NEG

## 2019-03-20 LAB — HEMOGLOBIN A1C
Hgb A1c MFr Bld: 7.8 % — ABNORMAL HIGH (ref 4.8–5.6)
Mean Plasma Glucose: 177.16 mg/dL

## 2019-03-20 LAB — PREPARE RBC (CROSSMATCH)

## 2019-03-20 LAB — FERRITIN: Ferritin: 1688 ng/mL — ABNORMAL HIGH (ref 24–336)

## 2019-03-20 LAB — SEDIMENTATION RATE: Sed Rate: 140 mm/hr — ABNORMAL HIGH (ref 0–16)

## 2019-03-20 LAB — SARS CORONAVIRUS 2 (TAT 6-24 HRS): SARS Coronavirus 2: NEGATIVE

## 2019-03-20 LAB — PREALBUMIN: Prealbumin: 10.2 mg/dL — ABNORMAL LOW (ref 18–38)

## 2019-03-20 MED ORDER — DELFLEX-LC/2.5% DEXTROSE 394 MOSM/L IP SOLN
INTRAPERITONEAL | Status: DC
  Administered 2019-03-20 – 2019-03-22 (×3): 5000 mL via INTRAPERITONEAL

## 2019-03-20 MED ORDER — HEPARIN 1000 UNIT/ML FOR PERITONEAL DIALYSIS: 500.0000 [IU] | INTRAMUSCULAR | Status: DC | PRN

## 2019-03-20 MED ORDER — VANCOMYCIN VARIABLE DOSE PER UNSTABLE RENAL FUNCTION (PHARMACIST DOSING): Status: DC

## 2019-03-20 MED ORDER — SODIUM CHLORIDE 0.9% IV SOLUTION: Freq: Once | INTRAVENOUS | Status: AC

## 2019-03-20 MED ORDER — GENTAMICIN SULFATE 0.1 % EX CREA
1.0000 "application " | TOPICAL_CREAM | Freq: Every day | CUTANEOUS | Status: DC
  Administered 2019-03-20 – 2019-03-28 (×9): 1 via TOPICAL
  Filled 2019-03-20: qty 15

## 2019-03-20 MED ORDER — RENA-VITE PO TABS
1.0000 | ORAL_TABLET | Freq: Every day | ORAL | Status: DC
  Administered 2019-03-20 – 2019-03-30 (×11): 1 via ORAL
  Filled 2019-03-20 (×11): qty 1

## 2019-03-20 MED ORDER — SODIUM BICARBONATE 650 MG PO TABS
1300.0000 mg | ORAL_TABLET | Freq: Three times a day (TID) | ORAL | Status: DC
  Administered 2019-03-20 – 2019-03-31 (×33): 1300 mg via ORAL
  Filled 2019-03-20 (×33): qty 2

## 2019-03-20 MED ORDER — HEPARIN 1000 UNIT/ML FOR PERITONEAL DIALYSIS
INTRAPERITONEAL | Status: DC | PRN
  Filled 2019-03-20 (×6): qty 5000

## 2019-03-20 MED ORDER — CALCITRIOL 0.5 MCG PO CAPS
0.5000 ug | ORAL_CAPSULE | ORAL | Status: DC
  Administered 2019-03-20 – 2019-03-30 (×4): 0.5 ug via ORAL
  Filled 2019-03-20 (×5): qty 1

## 2019-03-20 MED ORDER — PRO-STAT SUGAR FREE PO LIQD
30.0000 mL | Freq: Two times a day (BID) | ORAL | Status: DC
  Administered 2019-03-20: 30 mL via ORAL
  Filled 2019-03-20: qty 30

## 2019-03-20 MED ORDER — PRO-STAT SUGAR FREE PO LIQD
30.0000 mL | Freq: Three times a day (TID) | ORAL | Status: DC
  Administered 2019-03-20 – 2019-03-31 (×32): 30 mL via ORAL
  Filled 2019-03-20 (×33): qty 30

## 2019-03-20 MED ORDER — INSULIN GLARGINE 100 UNIT/ML ~~LOC~~ SOLN
10.0000 [IU] | Freq: Every day | SUBCUTANEOUS | Status: DC
  Administered 2019-03-20 – 2019-03-21 (×2): 10 [IU] via SUBCUTANEOUS
  Filled 2019-03-20 (×2): qty 0.1

## 2019-03-20 NOTE — Progress Notes (Signed)
Inpatient Diabetes Program Recommendations  AACE/ADA: New Consensus Statement on Inpatient Glycemic Control (2015)  Target Ranges:  Prepandial:   less than 140 mg/dL      Peak postprandial:   less than 180 mg/dL (1-2 hours)      Critically ill patients:  140 - 180 mg/dL   Results for Jorge [name], Jorge [name] (MRN [id number]) as of 03/20/2019 08:50  Ref. Range 03/19/2019 22:45 03/20/2019 04:32  Glucose-Capillary Latest Ref Range: 70 - 99 mg/dL 293 (H)  Pt REFUSED Novolog SSI 247 (H)  Pt REFUSED Novolog SSI   Results for Jorge [name], Jorge [name] (MRN [id number]) as of 03/20/2019 08:50  Ref. Range 03/20/2019 00:38  Hemoglobin A1C Latest Ref Range: 4.8 - 5.6 % 7.8 (H)    Admit with: Diabetic foot infection   History: DM, ESRD (PD at home)  Home DM Meds: Lantus 10 units QHS       Regular 3-13 units TID per SSI  Current Orders: Lantus 10 units Daily     Novolog 0-6 units Q4 hours     MD- Note Lantus and Novolog started this AM.  Pt has refused both the 12am and the 4am does of Novolog SSI.  Due for Lantus this AM.  Will follow while hospitalized.    --Will follow patient during hospitalization--  Wyn Quaker RN, MSN, CDE Diabetes Coordinator Inpatient Glycemic Control Team Team Pager: [phone] (8a-5p)

## 2019-03-20 NOTE — Consult Note (Addendum)
[facility] Renal Consultation Note    Indication for Consultation:  Management of ESRD/hemodialysis; anemia, hypertension/volume and secondary hyperparathyroidism PCP: No PCP yet. Moved to area 01/2019  HPI: Jorge [name] is a 49 y.o. male with ESRD on CCPD followed by Dr. Joelyn Oms at [facility]. PMH: DMT2, HTN, AOCD, SHPT. Recently moved here from [place], [place]. Patient was admitted 03/11/20201 with infected R foot with gangrenous changes. Says he had been following with wound care as OP, but after discussing with HD nurse, he wasn't seen by wound care D/T issues with insurance. Also has ulceration plantar aspect of L foot. WBC 16.2 BS 321 CO2 13 SCr 13.9 BUN 78 K+4.8 HGB 7.7 on arrival to ED. Xray R foot suspicious for gangrene/osteo. Xray L foot without evidence of osteo. He was seen this AM by Dr. Sharol Given who is recommending bilateral BKA. Patient says he hasn't discussed with family yet, no decision regarding surgery. HGB fell overnight to 5.4-no evidence of overt blood loss. He is being transfused at present. BC NG 12 hours. He has been started on Vancomycin and Zosyn per primary. Last PD was 03/18/2019. Patient denies issues with PD catheter, fever, chills, N, V,D, SOB, CP, abdominal pain or flank pain.   Past Medical History:  Diagnosis Date  . Diabetes mellitus without complication ([place])   . Hypertension   . Peritoneal dialysis status ([place])   . Renal disorder    Past Surgical History:  Procedure Laterality Date  . FOOT SURGERY Left   . KNEE SURGERY Left    History reviewed. No pertinent family history. Social History:  reports that he has never smoked. He has never used smokeless tobacco. He reports that he does not drink alcohol or use drugs. No Known Allergies Prior to Admission medications   Medication Sig Start Date End Date Taking? Authorizing Provider  amLODipine (NORVASC) 10 MG tablet Take 10 mg by mouth daily.   Yes [provider]   aspirin EC 81 MG tablet Take 81 mg by mouth daily.   Yes [provider]  atorvastatin (LIPITOR) 20 MG tablet Take 20 mg by mouth daily.   Yes [provider]  cloNIDine (CATAPRES) 0.1 MG tablet Take 0.1 mg by mouth at bedtime.   Yes [provider]  ergocalciferol (VITAMIN D2) 1.25 MG (50000 UT) capsule Take 50,000 Units by mouth once a week.   Yes [provider]  furosemide (LASIX) 80 MG tablet Take 80 mg by mouth daily.   Yes [provider]  hydrALAZINE (APRESOLINE) 100 MG tablet Take 150 mg by mouth 2 (two) times daily.   Yes [provider]  insulin glargine (LANTUS) 100 UNIT/ML injection Inject 10 Units into the skin at bedtime.   Yes [provider]  insulin regular (NOVOLIN R,HUMULIN R) 100 units/mL injection Inject 3-13 Units into the skin 3 (three) times daily as needed for high blood sugar (per sliding scale).   Yes [provider]  metoCLOPramide (REGLAN) 5 MG tablet Take 5 mg by mouth every 6 (six) hours as needed for nausea.   Yes [provider]  RENVELA 800 MG tablet Take 2,400 mg by mouth 3 (three) times daily. 05/10/17  Yes [provider]  sodium bicarbonate 650 MG tablet Take 650 mg by mouth 3 (three) times daily.   Yes [provider]  bisacodyl (DULCOLAX) 10 MG suppository Place 1 suppository (10 mg total) rectally as needed for moderate constipation. Patient not taking: Reported on 03/19/2019 06/13/17  Hayden Rasmussen, MD  docusate sodium (COLACE) 100 MG capsule Take 1 capsule (100 mg total) by mouth every 12 (twelve) hours. Patient not taking: Reported on 03/19/2019 06/13/17   Hayden Rasmussen, MD  ondansetron (ZOFRAN) 4 MG tablet Take 1 tablet (4 mg total) by mouth every 6 (six) hours. Patient not taking: Reported on 03/19/2019 06/13/17   Hayden Rasmussen, MD   Current Facility-Administered Medications  Medication Dose Route Frequency Provider Last Rate Last Admin  . 0.9 %   sodium chloride infusion  250 mL Intravenous PRN Opyd, Ilene Qua, MD      . acetaminophen (TYLENOL) tablet 650 mg  650 mg Oral Q6H PRN Opyd, Ilene Qua, MD       Or  . acetaminophen (TYLENOL) suppository 650 mg  650 mg Rectal Q6H PRN Opyd, Ilene Qua, MD      . atorvastatin (LIPITOR) tablet 20 mg  20 mg Oral q1800 Opyd, Ilene Qua, MD      . fentaNYL (SUBLIMAZE) injection 12.5-25 mcg  12.5-25 mcg Intravenous Q3H PRN Opyd, Ilene Qua, MD      . furosemide (LASIX) tablet 80 mg  80 mg Oral Daily Opyd, Ilene Qua, MD   80 mg at 03/20/19 0914  . insulin aspart (novoLOG) injection 0-6 Units  0-6 Units Subcutaneous Q4H Opyd, Ilene Qua, MD   2 Units at 03/20/19 0915  . insulin glargine (LANTUS) injection 10 Units  10 Units Subcutaneous Daily Nita Sells, MD   10 Units at 03/20/19 0915  . labetalol (NORMODYNE) injection 10 mg  10 mg Intravenous Q2H PRN Opyd, Ilene Qua, MD      . ondansetron (ZOFRAN) tablet 4 mg  4 mg Oral Q6H PRN Opyd, Ilene Qua, MD       Or  . ondansetron (ZOFRAN) injection 4 mg  4 mg Intravenous Q6H PRN Opyd, Ilene Qua, MD      . piperacillin-tazobactam (ZOSYN) IVPB 2.25 g  2.25 g Intravenous Q8H Opyd, Ilene Qua, MD   Stopped at 03/19/19 2130  . sevelamer carbonate (RENVELA) tablet 2,400 mg  2,400 mg Oral TID WC Opyd, Ilene Qua, MD   2,400 mg at 03/20/19 0914  . sodium bicarbonate tablet 1,300 mg  1,300 mg Oral TID Nita Sells, MD   1,300 mg at 03/20/19 0915  . sodium chloride flush (NS) 0.9 % injection 3 mL  3 mL Intravenous Q12H Opyd, Timothy S, MD      . sodium chloride flush (NS) 0.9 % injection 3 mL  3 mL Intravenous Q12H Opyd, Ilene Qua, MD   3 mL at 03/20/19 0026  . sodium chloride flush (NS) 0.9 % injection 3 mL  3 mL Intravenous PRN Opyd, Ilene Qua, MD      . vancomycin variable dose per unstable renal function (pharmacist dosing)   Does not apply See admin instructions Wendee Beavers, [facility]       Labs: Basic Metabolic Panel: Recent Labs  Lab 03/19/19 1541  03/20/19 0038  NA 128* 129*  K 4.8 5.3*  CL 95* 95*  CO2 13* 13*  GLUCOSE 321* 318*  BUN 78* 82*  CREATININE 13.93* 14.08*  CALCIUM 8.4* 8.5*   Liver Function Tests: Recent Labs  Lab 03/19/19 1541  AST 10*  ALT 10  ALKPHOS 69  BILITOT 0.7  PROT 7.5  ALBUMIN 2.1*   No results for input(s): LIPASE, AMYLASE in the last 168 hours. No results for input(s): AMMONIA in the last 168 hours. CBC: Recent Labs  Lab 03/19/19 1541 03/20/19 0038  WBC 16.2* 15.5*  NEUTROABS 10.5* 12.0*  HGB 7.7* 5.4*  HCT 25.8* 17.1*  MCV 99.6 94.5  PLT 292 279   Cardiac Enzymes: No results for input(s): CKTOTAL, CKMB, CKMBINDEX, TROPONINI in the last 168 hours. CBG: Recent Labs  Lab 03/19/19 2245 03/20/19 0432 03/20/19 0842  GLUCAP 293* 247* 226*   Iron Studies:  Recent Labs    03/20/19 0038  IRON 38*  TIBC 162*  FERRITIN 1,688*   Studies/Results: DG Foot Complete Left  Result Date: 03/19/2019 CLINICAL DATA:  Infection. History of diabetes. EXAM: LEFT FOOT - COMPLETE 3+ VIEW COMPARISON:  None. FINDINGS: Soft tissue defect subjacent to the plantar calcaneus. No associated bony destruction or periosteal reaction. No other sites suspicious for osteomyelitis. Hammertoe deformity of the digits limits assessment. No evidence of fracture. Advanced vascular calcifications. IMPRESSION: Soft tissue defect subjacent to the plantar calcaneus consistent with ulceration. No radiographic evidence of osteomyelitis. Electronically Signed   By: Keith Rake M.D.   On: 03/19/2019 20:45   DG Foot Complete Right  Result Date: 03/19/2019 CLINICAL DATA:  Infection. History of diabetes. EXAM: RIGHT FOOT COMPLETE - 3+ VIEW COMPARISON:  None. FINDINGS: Mild bowel gas projects over the first and second metatarsal phalangeal joints suspicious for gangrene. Erosions in bony destruction about the second toe proximal phalanx. Suspected erosion about the great toe proximal phalanx, partially obscured by overlying  air lucencies. Decreased density of the third metatarsal head which is nonspecific. Deformity of the distal fifth metatarsal may be postsurgical or remote injury/infection. Advanced vascular calcifications. No radiopaque foreign bodies. IMPRESSION: 1. Soft tissue gas projects over the first and second metatarsophalangeal joints suspicious for gangrene. 2. Erosions and bony destruction about the second toe proximal phalanx and possibly great toe proximal phalanx, suspicious for osteomyelitis. Nonspecific decreased density of the medial third metatarsal head. 3. Advanced vascular calcifications. Electronically Signed   By: Keith Rake M.D.   On: 03/19/2019 20:44    ROS: As per HPI otherwise negative.   Physical Exam: Vitals:   03/20/19 0550 03/20/19 0752 03/20/19 0815 03/20/19 0843  BP: (!) 144/65 (!) 148/63 (!) 145/65 137/61  Pulse: 73 67 68 70  Resp: 17 17 16 18   Temp: 98.3 F (36.8 C) 98.1 F (36.7 C) 98.2 F (36.8 C) 98.2 F (36.8 C)  TempSrc: Oral Oral Oral Oral  SpO2: 100% 97% 98% 98%  Weight:      Height:         General: Well developed, well nourished, in no acute distress. Head: Normocephalic, atraumatic, sclera non-icteric, mucus membranes are moist Neck: Supple. JVD not elevated. Lungs: Clear bilaterally to auscultation without wheezes, rales, or rhonchi. Breathing is unlabored. Heart: RRR with S1 S2. No murmurs, rubs, or gallops appreciated. Abdomen: Soft, non-tender, non-distended with normoactive bowel sounds. No rebound/guarding. No obvious abdominal masses. M-S:  Strength and tone appear normal for age. Lower extremities: Open ulceration plantar aspect of L foot under toes. R foot with foul odor, ulcerations and eschar toward heel.  Neuro: Alert and oriented X 3. Moves all extremities spontaneously. Psych:  Responds to questions appropriately with a normal affect. Dialysis Access: PD catheter. Has never had permanent access.   Dialysis Orders: Center: [facility]  Therapies CCPD 7  Days weekly 80 kg 4 exchanges 1.6 mls Dwell time 1.5 hr, No pause or day dwells. Uses mostly 2.5% solution  BMD meds: -Renvela 800 mg 3 tabs PO TID with meals -Calcitriol 0.5 mcg PO TIW (  MWF)   Assessment/Plan: 1.  Gangrene/Possible Osteo R Foot/Ulceration L foot: Seen by Dr. Sharol Given who is recommending bilateral BKAs. Patient not ready to make decision at present. Discussed risk of fatal infection again. On ABX per primary 2.  DM/Hyperglycemia: BS 300s CO2 13 AG 21. Per primary 3.  ESRD -  Continue CCPD per home orders. K+ elevated to 5.3 in setting of hyperglycemia. Should come down as BS normalizes.  4.  Hypertension/volume  - BP and volume appear controlled. No antihypertensive meds noted on OP med list. Continue PD with same orders.  5.  Anemia  - HGB initially 7.7 fell this AM to 5.4 this AM. Being transfused two units PRBCs. Had Mircera 225 mcg IV SQ and Venofer 200 mg IV 56/97/9480.  6.  Metabolic bone disease -  Hyperphosphatemia noted on OP labs 03/11/2019. Continue binders, calcitriol 0.5 mcg PO TIW 7.  Nutrition - Albumin low add prostat, renal vits.   Rita H. Owens Shark, NP-C 03/20/2019, 10:21 AM  [employer] [phone]  Pt seen, examined and agree w A/P as above.  Kelly Splinter  MD 03/20/2019, 3:10 PM

## 2019-03-20 NOTE — TOC Initial Note (Signed)
Transition of Care [facility]) - Initial/Assessment Note    Patient Details  Name: Jorge [name] MRN: [id number] Date of Birth: Mar 27, 1970  Transition of Care [facility]) CM/SW Contact:    Sharin Mons, RN Phone Number: 03/20/2019, 12:12 PM  Clinical Narrative:     Admitted with diabetic foot infection, Hx of ESRD on peritoneal dialysis, hypertension, insulin-dependent diabetes mellitus, PVD and anemia.   NCM spoke with pt @ bedside regarding d/c planning. Pt states recently moved from [place], [place] to [place]. States has been in [place] x 1 month. Resides with sister Jorge [name]. PTA independent with ADL, no DME usage.    Ortho MD recommendations:  bilateral transtibial amputation.  Pt states he doesn't want surgery and needs to discuss with his family.   Pt without PCP. NCM shared [place]. Appointment obtained and noted on AVS.  [place]   [phone] [place] [address]    Next Steps: Follow up on 04/14/2019    Instructions: 2:30 pm , Dr. Georgiann Hahn     NCM left voice message with Financial Counselor to f/u with pt concerning his [facility].  TOC team will continue to monitor and follow  For TOC needs ...  Expected Discharge Plan: Home/Self Care Barriers to Discharge: Continued Medical Work up   Patient Goals and CMS Choice Patient states their goals for this hospitalization and ongoing recovery are:: to get better and go home      Expected Discharge Plan and Services Expected Discharge Plan: Home/Self Care   Discharge Planning Services: CM Consult   Living arrangements for the past 2 months: Single Family Home                                      Prior Living Arrangements/Services Living arrangements for the past 2 months: Single Family Home Lives with:: Siblings Patient language and need for interpreter reviewed:: Yes Do you feel safe going back to the place where you live?: Yes      Need  for Family Participation in Patient Care: Yes (Comment) Care giver support system in place?: Yes (comment)   Criminal Activity/Legal Involvement Pertinent to Current Situation/Hospitalization: No - Comment as needed  Activities of Daily Living Home Assistive Devices/Equipment: CBG Meter ADL Screening (condition at time of admission) Patient's cognitive ability adequate to safely complete daily activities?: Yes Is the patient deaf or have difficulty hearing?: No Does the patient have difficulty seeing, even when wearing glasses/contacts?: No Does the patient have difficulty concentrating, remembering, or making decisions?: No Patient able to express need for assistance with ADLs?: Yes Does the patient have difficulty dressing or bathing?: No Independently performs ADLs?: Yes (appropriate for developmental age) Does the patient have difficulty walking or climbing stairs?: No Weakness of Legs: Both Weakness of Arms/Hands: None  Permission Sought/Granted Permission sought to share information with : Case Manager Permission granted to share information with : Yes, Verbal Permission Granted  Share Information with NAME: Jorge [name] [phone]           Emotional Assessment Appearance:: Appears stated age Attitude/Demeanor/Rapport: Engaged Affect (typically observed): Accepting Orientation: : Oriented to Self, Oriented to Place, Oriented to  Time, Oriented to Situation Alcohol / Substance Use: Not Applicable Psych Involvement: No (comment)  Admission diagnosis:  Wound infection [T14.8XXA, L08.9] ESRD on peritoneal dialysis ([place]) [N18.6, Z99.2] Diabetic foot infection ([place]) [T55.732, L08.9] Osteomyelitis of  right foot, unspecified type [facility]) [M86.9] Patient Active Problem List   Diagnosis Date Noted  . ESRD on peritoneal dialysis ([place])   . Osteomyelitis of right foot ([place])   . Gangrene of right foot ([place])   . Gangrene of left foot ([place])   . Diabetic foot infection  ([place]) 03/19/2019  . ESRD (end stage renal disease) ([place]) 03/19/2019  . Hypertension   . Insulin-requiring or dependent type II diabetes mellitus ([place])   . Normocytic anemia    PCP:  Patient, No Pcp Per Pharmacy:   [id number] - [place], [address] [place] [address] Phone: [phone] Fax: [phone]  [id number] - [place], [address] [place] [address] [place] [address] Phone: [phone] Fax: [phone]     Social Determinants of Health (SDOH) Interventions    Readmission Risk Interventions No flowsheet data found.

## 2019-03-20 NOTE — Plan of Care (Signed)
  Problem: Skin Integrity: Goal: Risk for impaired skin integrity will decrease Outcome: Progressing   Problem: Safety: Goal: Ability to remain free from injury will improve Outcome: Progressing   Problem: Pain Managment: Goal: General experience of comfort will improve Outcome: Progressing   Problem: Elimination: Goal: Will not experience complications related to bowel motility Outcome: Progressing   Problem: Activity: Goal: Risk for activity intolerance will decrease Outcome: Progressing   Problem: Clinical Measurements: Goal: Cardiovascular complication will be avoided Outcome: Progressing   Problem: Clinical Measurements: Goal: Will remain free from infection Outcome: Progressing   Problem: Clinical Measurements: Goal: Diagnostic test results will improve Outcome: Progressing

## 2019-03-20 NOTE — Progress Notes (Signed)
ABI completed.  03/20/2019 3:53 PM Kelby Aline., MHA, RVT, RDCS, RDMS

## 2019-03-20 NOTE — Consult Note (Signed)
ORTHOPAEDIC CONSULTATION  REQUESTING PHYSICIAN: Nita Sells, MD  Chief Complaint: Gangrene bilateral feet.  HPI: Jorge [name] is a 49 y.o. male who presents with diabetic insensate neuropathy hypertension peripheral vascular disease end-stage renal disease on peritoneal dialysis who presents with an extensive gangrenous necrotic changes of both feet.  Past Medical History:  Diagnosis Date  . Diabetes mellitus without complication ([place])   . Hypertension   . Peritoneal dialysis status ([place])   . Renal disorder    Past Surgical History:  Procedure Laterality Date  . FOOT SURGERY Left   . KNEE SURGERY Left    Social History   Socioeconomic History  . Marital status: Married    Spouse name: Not on file  . Number of children: Not on file  . Years of education: Not on file  . Highest education level: Not on file  Occupational History  . Not on file  Tobacco Use  . Smoking status: Never Smoker  . Smokeless tobacco: Never Used  Substance and Sexual Activity  . Alcohol use: Never  . Drug use: Never  . Sexual activity: Not on file  Other Topics Concern  . Not on file  Social History Narrative  . Not on file   Social Determinants of Health   Financial Resource Strain:   . Difficulty of Paying Living Expenses:   Food Insecurity:   . Worried About Charity fundraiser in the Last Year:   . Arboriculturist in the Last Year:   Transportation Needs:   . Film/video editor (Medical):   Marland Kitchen Lack of Transportation (Non-Medical):   Physical Activity:   . Days of Exercise per Week:   . Minutes of Exercise per Session:   Stress:   . Feeling of Stress :   Social Connections:   . Frequency of Communication with Friends and Family:   . Frequency of Social Gatherings with Friends and Family:   . Attends Religious Services:   . Active Member of Clubs or Organizations:   . Attends Archivist Meetings:   Marland Kitchen Marital Status:    History reviewed. No pertinent  family history. - negative except otherwise stated in the family history section No Known Allergies Prior to Admission medications   Medication Sig Start Date End Date Taking? Authorizing Provider  amLODipine (NORVASC) 10 MG tablet Take 10 mg by mouth daily.   Yes [provider]  aspirin EC 81 MG tablet Take 81 mg by mouth daily.   Yes [provider]  atorvastatin (LIPITOR) 20 MG tablet Take 20 mg by mouth daily.   Yes [provider]  cloNIDine (CATAPRES) 0.1 MG tablet Take 0.1 mg by mouth at bedtime.   Yes [provider]  ergocalciferol (VITAMIN D2) 1.25 MG (50000 UT) capsule Take 50,000 Units by mouth once a week.   Yes [provider]  furosemide (LASIX) 80 MG tablet Take 80 mg by mouth daily.   Yes [provider]  hydrALAZINE (APRESOLINE) 100 MG tablet Take 150 mg by mouth 2 (two) times daily.   Yes [provider]  insulin glargine (LANTUS) 100 UNIT/ML injection Inject 10 Units into the skin at bedtime.   Yes [provider]  insulin regular (NOVOLIN R,HUMULIN R) 100 units/mL injection Inject 3-13 Units into the skin 3 (three) times daily as needed for high blood sugar (per sliding scale).   Yes [provider]  metoCLOPramide (REGLAN) 5 MG tablet Take 5 mg by mouth every  6 (six) hours as needed for nausea.   Yes [provider]  RENVELA 800 MG tablet Take 2,400 mg by mouth 3 (three) times daily. 05/10/17  Yes [provider]  sodium bicarbonate 650 MG tablet Take 650 mg by mouth 3 (three) times daily.   Yes [provider]  bisacodyl (DULCOLAX) 10 MG suppository Place 1 suppository (10 mg total) rectally as needed for moderate constipation. Patient not taking: Reported on 03/19/2019 06/13/17   Hayden Rasmussen, MD  docusate sodium (COLACE) 100 MG capsule Take 1 capsule (100 mg total) by mouth every 12 (twelve) hours. Patient not taking: Reported on 03/19/2019 06/13/17   Hayden Rasmussen, MD  ondansetron (ZOFRAN) 4 MG tablet Take 1 tablet (4 mg total) by mouth every 6 (six) hours. Patient not taking: Reported on 03/19/2019 06/13/17   Hayden Rasmussen, MD   DG Foot Complete Left  Result Date: 03/19/2019 CLINICAL DATA:  Infection. History of diabetes. EXAM: LEFT FOOT - COMPLETE 3+ VIEW COMPARISON:  None. FINDINGS: Soft tissue defect subjacent to the plantar calcaneus. No associated bony destruction or periosteal reaction. No other sites suspicious for osteomyelitis. Hammertoe deformity of the digits limits assessment. No evidence of fracture. Advanced vascular calcifications. IMPRESSION: Soft tissue defect subjacent to the plantar calcaneus consistent with ulceration. No radiographic evidence of osteomyelitis. Electronically Signed   By: Keith Rake M.D.   On: 03/19/2019 20:45   DG Foot Complete Right  Result Date: 03/19/2019 CLINICAL DATA:  Infection. History of diabetes. EXAM: RIGHT FOOT COMPLETE - 3+ VIEW COMPARISON:  None. FINDINGS: Mild bowel gas projects over the first and second metatarsal phalangeal joints suspicious for gangrene. Erosions in bony destruction about the second toe proximal phalanx. Suspected erosion about the great toe proximal phalanx, partially obscured by overlying air lucencies. Decreased density of the third metatarsal head which is nonspecific. Deformity of the distal fifth metatarsal may be postsurgical or remote injury/infection. Advanced vascular calcifications. No radiopaque foreign bodies. IMPRESSION: 1. Soft tissue gas projects over the first and second metatarsophalangeal joints suspicious for gangrene. 2. Erosions and bony destruction about the second toe proximal phalanx and possibly great toe proximal phalanx, suspicious for osteomyelitis. Nonspecific decreased density of the medial third metatarsal head. 3. Advanced vascular calcifications. Electronically Signed   By: Keith Rake M.D.   On: 03/19/2019 20:44   - pertinent xrays, CT, MRI  studies were reviewed and independently interpreted  Positive ROS: All other systems have been reviewed and were otherwise negative with the exception of those mentioned in the HPI and as above.  Physical Exam: General: Alert, no acute distress Psychiatric: Patient is competent for consent with normal mood and affect Lymphatic: No axillary or cervical lymphadenopathy Cardiovascular: No pedal edema Respiratory: No cyanosis, no use of accessory musculature GI: No organomegaly, abdomen is soft and non-tender    Images:  @ENCIMAGES @  Labs:  Lab Results  Component Value Date   HGBA1C 7.8 (H) 03/20/2019   ESRSEDRATE 140 (H) 03/20/2019   CRP 15.9 (H) 03/20/2019   REPTSTATUS PENDING 03/19/2019   CULT  03/19/2019    NO GROWTH < 12 HOURS Performed at [facility] [address] [place], [place] [address]     Lab Results  Component Value Date   ALBUMIN 2.1 (L) 03/19/2019   ALBUMIN 3.1 (L) 02/11/2019   ALBUMIN 3.7 06/13/2017   PREALBUMIN 10.2 (L) 03/20/2019    Neurologic: Patient does not have protective sensation bilateral lower extremities.   MUSCULOSKELETAL:  Skin: Examination patient has dry gangrene of both feet including the hindfoot.  There is no ascending cellulitis.  Patient's lower extremities are thin and atrophic.  Patient does not have a palpable dorsalis pedis or posterior tibial pulse bilaterally.  He does have a strong femoral pulse.  Albumin 2.1 hemoglobin A1c 7.8.  Assessment: Assessment: Diabetic insensate neuropathy end-stage renal disease on peritoneal dialysis with severe protein caloric malnutrition with gangrene both feet.  Plan: Patient does not have foot salvage intervention options.  Discussed that his best option would be to proceed with a bilateral transtibial amputation.  Patient states he does not want to consider amputation surgery at this time.  Discussed that if he develops sepsis from these gangrenous feet it could be fatal.   Patient states he understands he may be willing to make a decision next week.  Thank you for the consult and the opportunity to see Jorge [name], [place] [phone] 9:48 AM

## 2019-03-20 NOTE — Progress Notes (Signed)
Initial Nutrition Assessment  DOCUMENTATION CODES:   Non-severe (moderate) malnutrition in context of chronic illness  INTERVENTION:   -Renal MVI with minerals daily -30 ml Prostat TID, each supplement provides 100 kcals and 15 grams protein  NUTRITION DIAGNOSIS:   Moderate Malnutrition related to chronic illness(ESRD on PD, DM) as evidenced by mild fat depletion, moderate fat depletion, mild muscle depletion, moderate muscle depletion.  GOAL:   Patient will meet greater than or equal to 90% of their needs  MONITOR:   PO intake, Supplement acceptance, Labs, Weight trends, Skin, I & O's  REASON FOR ASSESSMENT:   Consult Wound healing  ASSESSMENT:   Jorge [name] is a 49 y.o. male with medical history significant for ESRD on peritoneal dialysis, hypertension, insulin-dependent diabetes mellitus, and anemia, now presenting to emergency department for evaluation of swelling, drainage, and following up from his right foot.  Patient reports that he has had an ulcer at the plantar aspect of his left foot for at least a couple months and developed ulceration involving the right foot approximately 2 weeks ago.  The right foot has developed some swelling with foul odor and drainage over the past week or so.  He denies any fevers or chills, denies pain, denies any shortness of breath, cough, or sore throat.  Reports that he recently went to the area, is established with a nephrologist, but does not have a PCP yet.  Pt admitted with diabetic foot infections.   Reviewed I/O's: +431 ml x 24 hours  Spoke with pt at bedside, who had flat affect with minimal interaction and eye contact with this RD. Pt reports he has a great appetite and typically consumes 4-5 meals per day (Breakfast: eggs, grits/cream of wheat, bacon; Lunch: lean protein with vegetables; Dinner and HS snack: lean protein with vegetables OR homemade chicken soup). Pt reports preparing all of his meals from scratch at home.    He reports his UBW is around 170#. He does not think he has lost weight, but also admits to not monitoring his weight recently. He confirms PD and has been tolerating treatments without difficulty.   Pt shares that he has had an ulcer on his left foot for approximately 6 months; he was followed by a podiatrist who was debriding it, however, visits were discontinued when pt relocated to [facility]. He also shares that he noticed the ulcer on his right foot about one month ago.   Per orthopedics notes, recommending transibial amputation due to no foot salvage intervention options, however, pt refusing at this time.   Medications reviewed and include renelva.   Lab Results  Component Value Date   HGBA1C 7.8 (H) 03/20/2019   PTA DM medications are 10 units inuslin glargine daily, 3-13 units insulin regular TID. Discussed DM control at home with pt; he reports that his CGS are usually in the 200's or low, however, over the past month have increased about 60-90 points. Pt shares he notices it most after PD treatments are completed and CBGS lower after taking medications.   Labs reviewed: Na: 129, K: 5.3, CBGS: 247 (inpatient orders for glycemic control are 0-6 units insulin aspart every 4 hours and 10 unts insulin glarigne daily).   NUTRITION - FOCUSED PHYSICAL EXAM:    Most Recent Value  Orbital Region  Moderate depletion  Upper Arm Region  Mild depletion  Thoracic and Lumbar Region  Unable to assess  Buccal Region  No depletion  Temple Region  Moderate depletion  Clavicle Bone Region  Mild depletion  Clavicle and Acromion Bone Region  No depletion  Scapular Bone Region  No depletion  Dorsal Hand  Mild depletion  Patellar Region  Mild depletion  Anterior Thigh Region  Mild depletion  Posterior Calf Region  Mild depletion  Edema (RD Assessment)  Mild  Hair  Reviewed  Eyes  Reviewed  Mouth  Reviewed  Skin  Reviewed  Nails  Reviewed       Diet Order:   Diet Order             Diet Carb Modified Fluid consistency: Thin; Room service appropriate? Yes  Diet effective now              EDUCATION NEEDS:   Education needs have been addressed  Skin:  Skin Assessment: Skin Integrity Issues: Skin Integrity Issues:: Diabetic Ulcer Diabetic Ulcer: lt foot, rt foot (unstageable)  Last BM:  03/19/19  Height:   Ht Readings from Last 1 Encounters:  03/19/19 5\' 11"  (1.803 m)    Weight:   Wt Readings from Last 1 Encounters:  03/19/19 80 kg    Ideal Body Weight:  78.2 kg  BMI:  Body mass index is 24.6 kg/m.  Estimated Nutritional Needs:   Kcal:  9924-2683  Protein:  120-135 grams  Fluid:  1000 ml + UOP    Loistine Chance, RD, LDN, [place] Registered Dietitian II Certified Diabetes Care and Education Specialist Please refer to [facility] for RD and/or RD on-call/weekend/after hours pager

## 2019-03-20 NOTE — Consult Note (Addendum)
WOC consult requested for bilat feet prior to ortho involvement.  Reviewed photos and progress notes in the EMR; pt has necrotic areas to bilat feet.  Right side is noted to have mod amt tan drainage and foul odor.  Ortho team now plans to follow for further assessment and possible surgery according to progress notes; please refer to their team for further questions regarding plan of care.  Please re-consult if further assistance is needed.  Thank-you,  Julien Girt MSN, [place], [place], [place], [place]

## 2019-03-20 NOTE — Plan of Care (Signed)
  Problem: Education: Goal: Knowledge of General Education information will improve Description: Including pain rating scale, medication(s)/side effects and non-pharmacologic comfort measures Outcome: Progressing   Problem: Clinical Measurements: Goal: Will remain free from infection Outcome: Not Progressing   Problem: Skin Integrity: Goal: Risk for impaired skin integrity will decrease Outcome: Not Progressing

## 2019-03-20 NOTE — Progress Notes (Signed)
Hospitalist progress note   Patient from home, Patient going unclear at this time, [name] await patient's decision about surgery versus not and decide accordingly  Jorge [name] [id number] DOB: 20-Mar-1970 DOA: 03/19/2019  PCP: Patient, No Pcp Per   Narrative:  41 M known DM TY 2 complicated by nephropathy as well as diabetic gastroparesis, ESRD on PD, anemia of renal disease-he has been seen numerous times in the emergency room for gastroparesis-he was last seen in the ED 02/11/2019 for the same and was sent home as he was doing fair He returned to [facility] ED 03/19/2019 as he had developed an ulcer plantar aspect left foot and also with an ulceration of the right Glucose 321 BUN/creatinine 78/13, bicarb 13, sodium 129 potassium 5.3 anion gap 21 Foot x-ray left foot = soft tissue defect subjacent to plantar calcaneus with ulceration no evidence of osteomyelitis Foot x-ray right foot = soft tissue gas projects over first and second MTP joint suspicious gangrene erosions and bony destruction second toe proximal phalanx proximal great toe suspicious of osteomyelitis  Data Reviewed:  Sodium 129 potassium 4.8-5.3, CO2 13, BUN/creatinine 82/14.08 prealbumin 10.2 White count 16.2-->15.5 Hemoglobin 5.4 Iron 38 transferrin 23 ferritin 168.  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 with possible gas gangrene Patient declining surgery at this time-allow diabetic diet Continue at this time broad-spectrum Zosyn vancomycin  Pain control for now fentanyl 12-25 every 3 as needed favor use of oral meds if possible Follow blood culture trend inflammatory markers Check ABIs and may need vascular input in addition although it is unlikely that targeting a femoropopliteal would cause healing of gas gangrene I had a long discussion with him and informed him I would speak with orthopedics I also reaffirmed that he is full CODE STATUS  and would want everything done if something were to happen to him Presumed anemia likely 2/2 poor production secondary to underlying infection-baseline hemoglobin in 2019 was 13 Hemoccult 3/11 Ferritin is high indicating of persisting inflammation which could account for his anemia-has good reticulocytosis Transfusing 2 units getting second unit now-we will probably need dosing of IV iron and is relatively contraindicated for erythroid genic factor given elevated ferritin ESRD on PD secondary to DM TY 2 Elevated anion gap Likely hypervolemic hyponatremia from missed dialysis Nephrologist to be contacted regarding maintenance dialysis needs Will probably need bicarb given elevated anion gap as well as hyperkalemia--continue Lasix 80 daily, Renvela 2.4 g 3 times daily, sodium bicarb increased to 1.3 g 3 times daily DM TY 2 nephropathy gastroparesis and uncontrolled hyperglycemia Home regimen 3 to 13 units 3 times daily as needed, 10 units Lantus Considering DKA protocol if  sugars persistently remain high and he remains persistently acidotic Repeat labs after PD-if signs more in keeping with DKA transfer to stepdown for IV insulin Continue sliding scale every 4 sensitive, resume Lantus 10 HTN Holding clonidine 0.1 HF, hydralazine 150 twice daily, amlodipine 10 daily for now   Subjective: Awake coherent no distress tells me he would like to discuss further with orthopedic surgery planning and options-mentions that his mother has had ballooning of some of her leg arteries and that she did well with this without losing the leg Tells me that he absolutely would like to keep his legs and is not interested in limb salvage surgery  Consultants:   Orthopedics  Objective: Vitals:   03/20/19 0300 03/20/19 0324 03/20/19 0349 03/20/19  0550  BP: (!) 143/67 (!) 146/65 (!) 141/63 (!) 144/65  Pulse: 69 67 70 73  Resp: 17 18 18 17   Temp: 98.4 F (36.9 C) 98.5 F (36.9 C) 98.4 F (36.9 C) 98.3 F  (36.8 C)  TempSrc: Oral Oral Oral Oral  SpO2: 100% 100% 100% 100%  Weight:      Height:        Intake/Output Summary (Last 24 hours) at 03/20/2019 0743 Last data filed at 03/20/2019 0630 Gross per 24 hour  Intake 431.1 ml  Output --  Net 431.1 ml   Filed Weights   03/19/19 1920  Weight: 80 kg    Examination: EOMI NCAT no deficit S1-S2 no murmur rub or gallop Chest clinically clear no added sound no rales no rhonchi Abdomen soft no rebound Right lower extremity As above with oozing areas of wetness   Scheduled Meds: . atorvastatin  20 mg Oral q1800  . furosemide  80 mg Oral Daily  . insulin aspart  0-6 Units Subcutaneous Q4H  . sevelamer carbonate  2,400 mg Oral TID WC  . sodium bicarbonate  650 mg Oral TID  . sodium chloride flush  3 mL Intravenous Q12H  . sodium chloride flush  3 mL Intravenous Q12H   Continuous Infusions: . sodium chloride    . piperacillin-tazobactam (ZOSYN)  IV Stopped (03/19/19 2130)     LOS: 1 day   Time spent: [place], MD [facility]  03/20/2019, 7:43 AM

## 2019-03-20 NOTE — Plan of Care (Signed)
  Problem: Skin Integrity: Goal: Risk for impaired skin integrity will decrease Outcome: Progressing   Problem: Safety: Goal: Ability to remain free from injury will improve Outcome: Progressing   Problem: Elimination: Goal: Will not experience complications related to bowel motility Outcome: Progressing   Problem: Clinical Measurements: Goal: Ability to maintain clinical measurements within normal limits will improve Outcome: Progressing   Problem: Clinical Measurements: Goal: Will remain free from infection Outcome: Progressing   Problem: Education: Goal: Knowledge of General Education information will improve Description: Including pain rating scale, medication(s)/side effects and non-pharmacologic comfort measures Outcome: Progressing   Problem: Health Behavior/Discharge Planning: Goal: Ability to manage health-related needs will improve Outcome: Progressing

## 2019-03-20 NOTE — Progress Notes (Addendum)
Pt with B/L Foot wounds. L heel and foot bottom darken, old graft site. R foot Base/bottom of toes, open wound, purulent drainage and foul odor. Sterile 4X4 and Kerlix gauze wrapped both feet and elevated foot of bed.  Pt Does PD daily. Float Renal Nurse Notified, She stated pt will Be placed on list but to call again in the morning to confirm. Day shift nurse updated.  Per [facility]. M. Sharlet Salina to transfuse 3 units PR BC's . Per Blood Bank only 2 can be released(d/t shortage). [facility] updated, okay to transfuse 2 units of PR BC's Post transfuse completion, order H/H. See  Nursing orders.  Pt refused coverage for elevated CBG's.

## 2019-03-21 LAB — TYPE AND SCREEN
ABO/RH(D): O NEG
Antibody Screen: NEGATIVE
Unit division: 0
Unit division: 0

## 2019-03-21 LAB — CBC
HCT: 29 % — ABNORMAL LOW (ref 39.0–52.0)
Hemoglobin: 9.3 g/dL — ABNORMAL LOW (ref 13.0–17.0)
MCH: 29.9 pg (ref 26.0–34.0)
MCHC: 32.1 g/dL (ref 30.0–36.0)
MCV: 93.2 fL (ref 80.0–100.0)
Platelets: 240 10*3/uL (ref 150–400)
RBC: 3.11 MIL/uL — ABNORMAL LOW (ref 4.22–5.81)
RDW: 14.6 % (ref 11.5–15.5)
WBC: 13.6 10*3/uL — ABNORMAL HIGH (ref 4.0–10.5)
nRBC: 0 % (ref 0.0–0.2)

## 2019-03-21 LAB — GLUCOSE, CAPILLARY
Glucose-Capillary: 170 mg/dL — ABNORMAL HIGH (ref 70–99)
Glucose-Capillary: 197 mg/dL — ABNORMAL HIGH (ref 70–99)
Glucose-Capillary: 214 mg/dL — ABNORMAL HIGH (ref 70–99)
Glucose-Capillary: 251 mg/dL — ABNORMAL HIGH (ref 70–99)
Glucose-Capillary: 288 mg/dL — ABNORMAL HIGH (ref 70–99)
Glucose-Capillary: 310 mg/dL — ABNORMAL HIGH (ref 70–99)

## 2019-03-21 LAB — BPAM RBC
Blood Product Expiration Date: 202103222359
Blood Product Expiration Date: 202103242359
ISSUE DATE / TIME: 202103120318
ISSUE DATE / TIME: 202103120812
Unit Type and Rh: 9500
Unit Type and Rh: 9500

## 2019-03-21 LAB — C-REACTIVE PROTEIN: CRP: 14.4 mg/dL — ABNORMAL HIGH (ref <1.0)

## 2019-03-21 LAB — SEDIMENTATION RATE: Sed Rate: >140 mm/hr — ABNORMAL HIGH (ref 0–16)

## 2019-03-21 LAB — RENAL FUNCTION PANEL
Albumin: 1.8 g/dL — ABNORMAL LOW (ref 3.5–5.0)
Anion gap: 20 — ABNORMAL HIGH (ref 5–15)
BUN: 80 mg/dL — ABNORMAL HIGH (ref 6–20)
CO2: 15 mmol/L — ABNORMAL LOW (ref 22–32)
Calcium: 8.5 mg/dL — ABNORMAL LOW (ref 8.9–10.3)
Chloride: 97 mmol/L — ABNORMAL LOW (ref 98–111)
Creatinine, Ser: 13.69 mg/dL — ABNORMAL HIGH (ref 0.61–1.24)
GFR calc Af Amer: 4 mL/min — ABNORMAL LOW (ref >60)
GFR calc non Af Amer: 4 mL/min — ABNORMAL LOW (ref >60)
Glucose, Bld: 248 mg/dL — ABNORMAL HIGH (ref 70–99)
Phosphorus: 7.4 mg/dL — ABNORMAL HIGH (ref 2.5–4.6)
Potassium: 4.3 mmol/L (ref 3.5–5.1)
Sodium: 132 mmol/L — ABNORMAL LOW (ref 135–145)

## 2019-03-21 MED ORDER — CLONIDINE HCL 0.1 MG PO TABS
0.1000 mg | ORAL_TABLET | Freq: Every day | ORAL | Status: DC
  Administered 2019-03-21 – 2019-03-30 (×7): 0.1 mg via ORAL
  Filled 2019-03-21 (×10): qty 1

## 2019-03-21 MED ORDER — AMLODIPINE BESYLATE 10 MG PO TABS
10.0000 mg | ORAL_TABLET | Freq: Every day | ORAL | Status: DC
  Administered 2019-03-21 – 2019-03-30 (×8): 10 mg via ORAL
  Filled 2019-03-21 (×9): qty 1

## 2019-03-21 MED ORDER — INSULIN GLARGINE 100 UNIT/ML ~~LOC~~ SOLN
18.0000 [IU] | Freq: Every day | SUBCUTANEOUS | Status: DC
  Filled 2019-03-21: qty 0.18

## 2019-03-21 NOTE — Plan of Care (Addendum)
Peritoneal dialysis started around [place].   Problem: Education: Goal: Knowledge of General Education information will improve Description: Including pain rating scale, medication(s)/side effects and non-pharmacologic comfort measures Outcome: Progressing   Problem: Health Behavior/Discharge Planning: Goal: Ability to manage health-related needs will improve Outcome: Progressing   Problem: Clinical Measurements: Goal: Will remain free from infection Outcome: Progressing   Problem: Activity: Goal: Risk for activity intolerance will decrease Outcome: Progressing   Problem: Coping: Goal: Level of anxiety will decrease Outcome: Progressing   Problem: Elimination: Goal: Will not experience complications related to urinary retention Outcome: Progressing   Problem: Pain Managment: Goal: General experience of comfort will improve Outcome: Progressing   Problem: Safety: Goal: Ability to remain free from injury will improve Outcome: Progressing   Problem: Skin Integrity: Goal: Risk for impaired skin integrity will decrease Outcome: Progressing

## 2019-03-21 NOTE — Progress Notes (Signed)
Patient ID: Jorge [name], male   DOB: [birth year], 49 y.o.   MRN: [id number] Patient is seen in follow-up for gangrenous changes to both feet.  His ankle-brachial indices show noncompressible vessels with monophasic flow.  Patient does not have sufficient circulation to consider any type of foot salvage intervention again recommended proceeding with bilateral transtibial amputations.  Patient states he would like to consult with his family and states that his family should be available on Monday I will discuss this with patient and his family on Monday.

## 2019-03-21 NOTE — Progress Notes (Signed)
[name] Progress Note   Subjective: Seen in room. Says he "has good blood flow in legs" but unfortunately the opposite is true.  Per Dr. Jess Barters note, ABI show noncompressilbe with monophasic blood flow. No limb salvage options available. Says he will talk to his family Monday.  Objective Vitals:   03/20/19 2004 03/21/19 0325 03/21/19 0643 03/21/19 0729  BP: (!) 160/60 (!) 165/59 (!) 174/80 (!) 155/52  Pulse: 84 72 71 69  Resp: 18 17 17 17   Temp: 98.3 F (36.8 C) 98.8 F (37.1 C) 98.4 F (36.9 C) 98.1 F (36.7 C)  TempSrc: Oral Oral Oral Oral  SpO2: 100% 99% 99% 100%  Weight:   70 kg   Height:       Physical Exam General: Pleasant, well appearing male in NAD Heart: S1,S2 RRR Lungs: CTAB Abdomen:Active BS. PD catheter RLQ of abdomen, Drsg CDI.  Extremities:Open ulceration plantar aspect of L foot under toes. R foot with foul odor, ulcerations and eschar toward heel. Now both feet drsgs.  Dialysis Access: PD catheter. No AVF/AVG   Additional Objective Labs: Basic Metabolic Panel: Recent Labs  Lab 03/20/19 0038 03/20/19 1538 03/21/19 0914  NA 129* 132* 132*  K 5.3* 4.8 4.3  CL 95* 97* 97*  CO2 13* 14* 15*  GLUCOSE 318* 216* 248*  BUN 82* 88* 80*  CREATININE 14.08* 14.95* 13.69*  CALCIUM 8.5* 8.5* 8.5*  PHOS  --  8.4* 7.4*   Liver Function Tests: Recent Labs  Lab 03/19/19 1541 03/20/19 1538 03/21/19 0914  AST 10*  --   --   ALT 10  --   --   ALKPHOS 69  --   --   BILITOT 0.7  --   --   PROT 7.5  --   --   ALBUMIN 2.1* 2.0* 1.8*   No results for input(s): LIPASE, AMYLASE in the last 168 hours. CBC: Recent Labs  Lab 03/19/19 1541 03/19/19 1541 03/20/19 0038 03/20/19 1541 03/21/19 0914  WBC 16.2*  --  15.5*  --  13.6*  NEUTROABS 10.5*  --  12.0*  --   --   HGB 7.7*   < > 5.4* 9.1* 9.3*  HCT 25.8*   < > 17.1* 27.6* 29.0*  MCV 99.6  --  94.5  --  93.2  PLT 292  --  279  --  240   < > = values in this interval not displayed.    Blood Culture    Component Value Date/Time   SDES BLOOD SITE NOT SPECIFIED 03/19/2019 2016   SPECREQUEST  03/19/2019 2016    BOTTLES DRAWN AEROBIC AND ANAEROBIC Blood Culture adequate volume   CULT  03/19/2019 2016    NO GROWTH < 12 HOURS Performed at [facility], [place] [address], [place], [place] [address]    REPTSTATUS PENDING 03/19/2019 2016    Cardiac Enzymes: No results for input(s): CKTOTAL, CKMB, CKMBINDEX, TROPONINI in the last 168 hours. CBG: Recent Labs  Lab 03/20/19 1605 03/20/19 2004 03/21/19 0006 03/21/19 0326 03/21/19 0821  GLUCAP 188* 252* 310* 288* 251*   Iron Studies:  Recent Labs    03/20/19 0038  IRON 38*  TIBC 162*  FERRITIN 1,688*   @lablastinr3 @ Studies/Results: DG Foot Complete Left  Result Date: 03/19/2019 CLINICAL DATA:  Infection. History of diabetes. EXAM: LEFT FOOT - COMPLETE 3+ VIEW COMPARISON:  None. FINDINGS: Soft tissue defect subjacent to the plantar calcaneus. No associated bony destruction or periosteal reaction. No other sites  suspicious for osteomyelitis. Hammertoe deformity of the digits limits assessment. No evidence of fracture. Advanced vascular calcifications. IMPRESSION: Soft tissue defect subjacent to the plantar calcaneus consistent with ulceration. No radiographic evidence of osteomyelitis. Electronically Signed   By: Keith Rake M.D.   On: 03/19/2019 20:45   DG Foot Complete Right  Result Date: 03/19/2019 CLINICAL DATA:  Infection. History of diabetes. EXAM: RIGHT FOOT COMPLETE - 3+ VIEW COMPARISON:  None. FINDINGS: Mild bowel gas projects over the first and second metatarsal phalangeal joints suspicious for gangrene. Erosions in bony destruction about the second toe proximal phalanx. Suspected erosion about the great toe proximal phalanx, partially obscured by overlying air lucencies. Decreased density of the third metatarsal head which is nonspecific. Deformity of the distal fifth metatarsal may be postsurgical  or remote injury/infection. Advanced vascular calcifications. No radiopaque foreign bodies. IMPRESSION: 1. Soft tissue gas projects over the first and second metatarsophalangeal joints suspicious for gangrene. 2. Erosions and bony destruction about the second toe proximal phalanx and possibly great toe proximal phalanx, suspicious for osteomyelitis. Nonspecific decreased density of the medial third metatarsal head. 3. Advanced vascular calcifications. Electronically Signed   By: Keith Rake M.D.   On: 03/19/2019 20:44   VAS [place] ABI WITH/WO TBI  Result Date: 03/20/2019 LOWER EXTREMITY DOPPLER STUDY Indications: Gangrene. High Risk Factors: Hypertension, Diabetes.  Limitations: Today's exam was limited due to an open wound. Comparison Study: No prior study Performing Technologist: Maudry Mayhew MHA, RVT, RDCS, RDMS  Examination Guidelines: A complete evaluation includes at minimum, Doppler waveform signals and systolic blood pressure reading at the level of bilateral brachial, anterior tibial, and posterior tibial arteries, when vessel segments are accessible. Bilateral testing is considered an integral part of a complete examination. Photoelectric Plethysmograph (PPG) waveforms and toe systolic pressure readings are included as required and additional duplex testing as needed. Limited examinations for reoccurring indications may be performed as noted.  ABI Findings: +--------+------------------+-----+----------+--------+ Right   Rt Pressure (mmHg)IndexWaveform  Comment  +--------+------------------+-----+----------+--------+ [id number]                    triphasic          +--------+------------------+-----+----------+--------+ PTA     255               1.00 monophasic         +--------+------------------+-----+----------+--------+ DP      255               1.00 monophasic         +--------+------------------+-----+----------+--------+  +--------+------------------+-----+----------+-------+ Left    Lt Pressure (mmHg)IndexWaveform  Comment +--------+------------------+-----+----------+-------+ [id number]                    triphasic         +--------+------------------+-----+----------+-------+ PTA                            absent            +--------+------------------+-----+----------+-------+ DP      254               1.00 monophasic        +--------+------------------+-----+----------+-------+  Summary: Right: Resting right ankle-brachial index indicates noncompressible right lower extremity arteries. Left: Resting left ankle-brachial index indicates noncompressible left lower extremity arteries.  *See table(s) above for measurements and observations.    Preliminary    Medications: . sodium chloride    . dialysis solution 2.5%  low-MG/low-CA    . piperacillin-tazobactam (ZOSYN)  IV 2.25 g (03/21/19 0604)   . atorvastatin  20 mg Oral q1800  . calcitRIOL  0.5 mcg Oral Once per day on Mon Wed Fri  . feeding supplement (PRO-STAT SUGAR FREE 64)  30 mL Oral TID BM  . furosemide  80 mg Oral Daily  . gentamicin cream  1 application Topical Daily  . insulin aspart  0-6 Units Subcutaneous Q4H  . insulin glargine  10 Units Subcutaneous Daily  . multivitamin  1 tablet Oral QHS  . sevelamer carbonate  2,400 mg Oral TID WC  . sodium bicarbonate  1,300 mg Oral TID  . sodium chloride flush  3 mL Intravenous Q12H  . sodium chloride flush  3 mL Intravenous Q12H  . vancomycin variable dose per unstable renal function (pharmacist dosing)   Does not apply See admin instructions     Dialysis Orders: Center: [facility] CCPD 7  Days weekly 80 kg 4 exchanges 1.6 mls Dwell time 1.5 hr, No pause or day dwells. Uses mostly 2.5% solution  BMD meds: -Renvela 800 mg 3 tabs PO TID with meals -Calcitriol 0.5 mcg PO TIW (MWF)   Assessment/Plan: 1.  Gangrene/Possible Osteo R Foot/Ulceration L foot: Seen by Dr. Sharol Given  who is recommending bilateral BKAs. Patient not ready to make decision at present. Appears in denial. Discussed risk of fatal infection again. On ABX per primary 2.  DM/Hyperglycemia: BS 300s CO2 15 AG 20 Per primary 3.  ESRD -  Continue CCPD per home orders. K+ 4.3 today. Na improved to 132. Continue 2.5% solution all exchanges.  4.  Hypertension/volume  - BP and volume appear controlled. No antihypertensive meds noted on OP med list. Continue PD with same orders. Wt down-doubt accuracy. Standing wt if possible tonight. 5.  Anemia  - HGB initially 7.7 fell this AM to 5.4 this AM. Transfused two units PRBCs 02/20/19 HGB 9.3 today. Had Mircera 225 mcg IV SQ and Venofer 200 mg IV 16/60/6301.  6.  Metabolic bone disease -  Hyperphosphatemia noted. C Ca 10.3 Continue binders, decrease calcitriol 0.25 mcg PO TIW 7.  Nutrition - Albumin low add prostat, renal vits.    [name] H. Chima Astorino NP-C 03/21/2019, 10:45 AM  [employer] [phone]

## 2019-03-21 NOTE — Plan of Care (Signed)
  Problem: Pain Managment: Goal: General experience of comfort will improve Outcome: Progressing   Problem: Safety: Goal: Ability to remain free from injury will improve Outcome: Progressing   Problem: Skin Integrity: Goal: Risk for impaired skin integrity will decrease Outcome: Progressing   

## 2019-03-21 NOTE — Progress Notes (Signed)
Hospitalist progress note   Patient from home, Patient going unclear at this time, [name] await patient's decision about surgery versus not and decide accordingly  Jorge [name] [id number] DOB: 05/22/1970 DOA: 03/19/2019  PCP: Patient, No Pcp Per   Narrative:  76 M known DM TY 2 complicated by nephropathy as well as diabetic gastroparesis, ESRD on PD, anemia of renal disease-he has been seen numerous times in the emergency room for gastroparesis-he was last seen in the ED 02/11/2019 for the same and was sent home as he was doing fair He returned to [facility] ED 03/19/2019 as he had developed an ulcer plantar aspect left foot and also with an ulceration of the right Glucose 321 BUN/creatinine 78/13, bicarb 13, sodium 129 potassium 5.3 anion gap 21 Foot x-ray left foot = soft tissue defect subjacent to plantar calcaneus with ulceration no evidence of osteomyelitis Foot x-ray right foot = soft tissue gas projects over first and second MTP joint suspicious gangrene erosions and bony destruction second toe proximal phalanx proximal great toe suspicious of osteomyelitis  Declining surgery and asking fro non-operative opinion--await family coming from out of town to Location manager  Data Reviewed:  Sodium 129-->132-->4.3 potassium 4.8-5.3, CO2 13-->15 , BUN/creatinine 82/14.08-->80/13.6 prealbumin 10.2 White count 16.2-->15.5-->13.6 Hemoglobin 5.4--2 u prbc on 03/20/19--->9.3 Lactic acid cycled from 2.4-1.4 Bc from 3/11 ngtd  Assessment & Plan:  Sepsis secondary osteomyelitis of the first and second MTP joints with possible gas gangrene Patient await family to d/w them surgical options Continue Zosyn vancomycin  Pain control Tylenol, fentanyl 12.5 every 3 as needed Long discussion Dr. Sharol Given who has followed patient and feels amputation is only option-ABIs performed but incompressible vessels Anemia of critical illness/renal disease-baseline hemoglobin in 2019 was 13 Hemoccult 3/11 was negative Ferritin  elevated 2/2 inflammation--transfused as above-IV iron/Mircera dosing per renal ESRD on PD secondary to DM TY 2 Elevated anion gap Likely hypervolemic hyponatremia from missed dialysis Nephrologist aware continue Lasix 80 daily, Renvela 2.4 g 3 times daily, correct acidosis with sodium bicarb increased to 1.3 g 3 times daily DM TY 2 nephropathy gastroparesis and uncontrolled hyperglycemia [Home regimen 3 to 13 units 3 times daily as needed, 10 units Lantus] Continue sliding scale every 4 sensitive, increase Lantus 10-18 units HTN Resuming clonidine 0.1 HF, amlodipine 10 daily--hold  hydralazine 150 twice daily,   Subjective:  No distress  Awaiting family to discuss further surgical options No pain tol diet No fever no diarr  Consultants:   Orthopedics  Objective: Vitals:   03/21/19 0325 03/21/19 0643 03/21/19 0729 03/21/19 1100  BP: (!) 165/59 (!) 174/80 (!) 155/52   Pulse: 72 71 69   Resp: 17 17 17    Temp: 36.6 F (37.1 C) 98.4 F (36.9 C) 98.1 F (36.7 C)   TempSrc: Oral Oral Oral   SpO2: 99% 99% 100%   Weight:  70 kg  70 kg  Height:    5\' 11"  (1.803 m)    Intake/Output Summary (Last 24 hours) at 03/21/2019 1337 Last data filed at 03/21/2019 0830 Gross per 24 hour  Intake 493 ml  Output 400 ml  Net 93 ml   Filed Weights   03/20/19 1905 03/21/19 0643 03/21/19 1100  Weight: 70.4 kg 70 kg 70 kg    Examination: EOMI NCAT no deficit S1-S2 no murmur rub or gallop Chest clinically clear no added sound no rales no rhonchi Wound not visualized today   Scheduled Meds: . amLODipine  10 mg Oral Daily  . atorvastatin  20 mg Oral q1800  . calcitRIOL  0.5 mcg Oral Once per day on Mon Wed Fri  . cloNIDine  0.1 mg Oral QHS  . feeding supplement (PRO-STAT SUGAR FREE 64)  30 mL Oral TID BM  . furosemide  80 mg Oral Daily  . gentamicin cream  1 application Topical Daily  . insulin aspart  0-6 Units Subcutaneous Q4H  . [START ON 03/22/2019] insulin glargine  18 Units  Subcutaneous Daily  . multivitamin  1 tablet Oral QHS  . sevelamer carbonate  2,400 mg Oral TID WC  . sodium bicarbonate  1,300 mg Oral TID  . sodium chloride flush  3 mL Intravenous Q12H  . sodium chloride flush  3 mL Intravenous Q12H  . vancomycin variable dose per unstable renal function (pharmacist dosing)   Does not apply See admin instructions   Continuous Infusions: . sodium chloride    . dialysis solution 2.5% low-MG/low-CA    . piperacillin-tazobactam (ZOSYN)  IV 2.25 g (03/21/19 1234)     LOS: 2 days   Time spent: [place], MD [facility]  03/21/2019, 1:37 PM

## 2019-03-22 LAB — RENAL FUNCTION PANEL
Albumin: 1.7 g/dL — ABNORMAL LOW (ref 3.5–5.0)
Anion gap: 19 — ABNORMAL HIGH (ref 5–15)
BUN: 81 mg/dL — ABNORMAL HIGH (ref 6–20)
CO2: 19 mmol/L — ABNORMAL LOW (ref 22–32)
Calcium: 8.5 mg/dL — ABNORMAL LOW (ref 8.9–10.3)
Chloride: 97 mmol/L — ABNORMAL LOW (ref 98–111)
Creatinine, Ser: 12.87 mg/dL — ABNORMAL HIGH (ref 0.61–1.24)
GFR calc Af Amer: 5 mL/min — ABNORMAL LOW (ref >60)
GFR calc non Af Amer: 4 mL/min — ABNORMAL LOW (ref >60)
Glucose, Bld: 258 mg/dL — ABNORMAL HIGH (ref 70–99)
Phosphorus: 6.5 mg/dL — ABNORMAL HIGH (ref 2.5–4.6)
Potassium: 4.2 mmol/L (ref 3.5–5.1)
Sodium: 135 mmol/L (ref 135–145)

## 2019-03-22 LAB — C-REACTIVE PROTEIN: CRP: 12.4 mg/dL — ABNORMAL HIGH (ref <1.0)

## 2019-03-22 LAB — GLUCOSE, CAPILLARY
Glucose-Capillary: 131 mg/dL — ABNORMAL HIGH (ref 70–99)
Glucose-Capillary: 134 mg/dL — ABNORMAL HIGH (ref 70–99)
Glucose-Capillary: 197 mg/dL — ABNORMAL HIGH (ref 70–99)
Glucose-Capillary: 208 mg/dL — ABNORMAL HIGH (ref 70–99)
Glucose-Capillary: 244 mg/dL — ABNORMAL HIGH (ref 70–99)
Glucose-Capillary: 342 mg/dL — ABNORMAL HIGH (ref 70–99)

## 2019-03-22 LAB — CBC
HCT: 27.8 % — ABNORMAL LOW (ref 39.0–52.0)
Hemoglobin: 8.9 g/dL — ABNORMAL LOW (ref 13.0–17.0)
MCH: 29.8 pg (ref 26.0–34.0)
MCHC: 32 g/dL (ref 30.0–36.0)
MCV: 93 fL (ref 80.0–100.0)
Platelets: 265 10*3/uL (ref 150–400)
RBC: 2.99 MIL/uL — ABNORMAL LOW (ref 4.22–5.81)
RDW: 14.3 % (ref 11.5–15.5)
WBC: 14 10*3/uL — ABNORMAL HIGH (ref 4.0–10.5)
nRBC: 0 % (ref 0.0–0.2)

## 2019-03-22 LAB — SEDIMENTATION RATE: Sed Rate: >140 mm/hr — ABNORMAL HIGH (ref 0–16)

## 2019-03-22 LAB — VANCOMYCIN, RANDOM: Vancomycin Rm: 20

## 2019-03-22 MED ORDER — INSULIN GLARGINE 100 UNIT/ML ~~LOC~~ SOLN
22.0000 [IU] | Freq: Every day | SUBCUTANEOUS | Status: DC
  Administered 2019-03-22 – 2019-03-26 (×4): 22 [IU] via SUBCUTANEOUS
  Filled 2019-03-22 (×6): qty 0.22

## 2019-03-22 MED ORDER — VANCOMYCIN HCL 500 MG/100ML IV SOLN
500.0000 mg | Freq: Once | INTRAVENOUS | Status: AC
  Administered 2019-03-22: 500 mg via INTRAVENOUS
  Filled 2019-03-22: qty 100

## 2019-03-22 NOTE — Plan of Care (Signed)
  Problem: Education: Goal: Knowledge of General Education information will improve Description: Including pain rating scale, medication(s)/side effects and non-pharmacologic comfort measures Outcome: Progressing   Problem: Health Behavior/Discharge Planning: Goal: Ability to manage health-related needs will improve Outcome: Progressing   Problem: Clinical Measurements: Goal: Will remain free from infection Outcome: Progressing   Problem: Activity: Goal: Risk for activity intolerance will decrease Outcome: Progressing   Problem: Coping: Goal: Level of anxiety will decrease Outcome: Progressing   Problem: Elimination: Goal: Will not experience complications related to urinary retention Outcome: Progressing   Problem: Pain Managment: Goal: General experience of comfort will improve Outcome: Progressing   Problem: Safety: Goal: Ability to remain free from injury will improve Outcome: Progressing   Problem: Skin Integrity: Goal: Risk for impaired skin integrity will decrease Outcome: Progressing

## 2019-03-22 NOTE — Progress Notes (Signed)
Hospitalist progress note   Patient from home, Patient going unclear at this time, [name] await patient's decision about surgery versus not and decide accordingly  Jorge [name] [id number] DOB: 04-21-1970 DOA: 03/19/2019  PCP: Patient, No Pcp Per   Narrative:  23 M known DM TY 2 complicated by nephropathy as well as diabetic gastroparesis, ESRD on PD, anemia of renal disease-he has been seen numerous times in the emergency room for gastroparesis-he was last seen in the ED 02/11/2019 for the same and was sent home as he was doing fair He returned to [facility] ED 03/19/2019 as he had developed an ulcer plantar aspect left foot and also with an ulceration of the right Glucose 321 BUN/creatinine 78/13, bicarb 13, sodium 129 potassium 5.3 anion gap 21 Foot x-ray left foot = soft tissue defect subjacent to plantar calcaneus with ulceration no evidence of osteomyelitis Foot x-ray right foot = soft tissue gas projects over first and second MTP joint suspicious gangrene erosions and bony destruction second toe proximal phalanx proximal great toe suspicious of osteomyelitis  Declining surgery and asking fro non-operative opinion--await family coming from out of town to Location manager  Data Reviewed:  Sodium 129--> 135 Potassium-->4.2  CO2 13--> 19 , BUN/creatinine 82/14.08--> 81/12.8 White count 16.2-->1 14 Hemoglobin 5.4--2 u prbc on 03/20/19--->9.3-->8.9 Lactic acid cycled from 2.4-1.4 Bc from 3/11 ngtd  Assessment & Plan:  Sepsis secondary osteomyelitis of the first and second MTP joints with possible gas gangrene Patient and surgeon await family to d/w them surgical options Continue Zosyn vancomycin  Patient's feet are insensate and he does not feel any pain even when bearing weight on his feet so I have discontinued fentanyl as he is not using Anemia of critical illness/renal disease-baseline hemoglobin in 2019 was 13 Hemoccult 3/11 was negative Ferritin elevated 2/2 inflammation--transfused as  above-IV iron/Mircera dosing per renal ESRD on PD secondary to DM TY 2 Elevated anion gap Likely hypervolemic hyponatremia from missed dialysis Nephrologist following continue Lasix 80 daily, Renvela 2.4 g 3 times daily, correct acidosis with sodium bicarb increased to 1.3 g 3 times daily Labs are within his usual realm His acidosis is improving with higher dose of bicarb as above DM TY 2 nephropathy gastroparesis and uncontrolled hyperglycemia [Home regimen 3 to 13 units 3 times daily as needed, 10 units Lantus] CBG somewhat uncontrolled 244 342 Continue sliding scale every 4 sensitive, increase Lantus 10---> 22 units HTN Resuming clonidine 0.1 HF, amlodipine 10 daily--hold  hydralazine 150 twice daily,   Subjective:  Refusing surgery Awaiting family Seems quite fixed on the fact that "I do not want surgery on both legs" Seems worried about how he will ambulate and get around at home with his need for peritoneal dialysis and tells me that he absolutely does not want to go to an office for dialysis center for dialysis  Consultants:   Orthopedics  Objective: Vitals:   03/21/19 1941 03/22/19 0412 03/22/19 0745 03/22/19 0753  BP: (!) 168/65 (!) 144/56 (!) 143/57 (!) 149/78  Pulse: 73 70 71 69  Resp: 16 18 17 16   Temp: 98.8 F (37.1 C) 98.2 F (36.8 C) 98 F (36.7 C) 98.3 F (36.8 C)  TempSrc: Oral Oral Oral Oral  SpO2: 100% 99% 99% 100%  Weight:    69.8 kg  Height:        Intake/Output Summary (Last 24 hours) at 03/22/2019 0838 Last data filed at 03/21/2019 1538 Gross per 24 hour  Intake 100 ml  Output --  Net 100 ml   Filed Weights   03/21/19 0643 03/21/19 1100 03/22/19 0753  Weight: 70 kg 70 kg 69.8 kg    Examination:  Awake coherent no distress EOMI NCAT no focal deficit S1-S2 no murmur Chest clear Neuro intact moving all 4 limbs Skin exam Wound exam as below and they appear more wet and oozy compared to prior especially on the right side at  metatarsophalangeal joints His left heel seems to be also breaking down and there is a very strong odor           Scheduled Meds: . amLODipine  10 mg Oral Daily  . atorvastatin  20 mg Oral q1800  . calcitRIOL  0.5 mcg Oral Once per day on Mon Wed Fri  . cloNIDine  0.1 mg Oral QHS  . feeding supplement (PRO-STAT SUGAR FREE 64)  30 mL Oral TID BM  . furosemide  80 mg Oral Daily  . gentamicin cream  1 application Topical Daily  . insulin aspart  0-6 Units Subcutaneous Q4H  . insulin glargine  18 Units Subcutaneous Daily  . multivitamin  1 tablet Oral QHS  . sevelamer carbonate  2,400 mg Oral TID WC  . sodium bicarbonate  1,300 mg Oral TID  . sodium chloride flush  3 mL Intravenous Q12H  . sodium chloride flush  3 mL Intravenous Q12H  . vancomycin variable dose per unstable renal function (pharmacist dosing)   Does not apply See admin instructions   Continuous Infusions: . sodium chloride    . dialysis solution 2.5% low-MG/low-CA    . piperacillin-tazobactam (ZOSYN)  IV 2.25 g (03/22/19 0544)     LOS: 3 days   Time spent: [place], MD [facility]  03/22/2019, 8:38 AM

## 2019-03-22 NOTE — Progress Notes (Signed)
[facility] Progress Note   Subjective: Still no decision regarding BKA.   Objective Vitals:   03/21/19 1941 03/22/19 0412 03/22/19 0745 03/22/19 0753  BP: (!) 168/65 (!) 144/56 (!) 143/57 (!) 149/78  Pulse: 73 70 71 69  Resp: 16 18 17 16   Temp: 98.8 F (37.1 C) 98.2 F (36.8 C) 98 F (36.7 C) 98.3 F (36.8 C)  TempSrc: Oral Oral Oral Oral  SpO2: 100% 99% 99% 100%  Weight:    69.8 kg  Height:       Physical Exam General: Pleasant, well appearing male in NAD Heart: S1,S2 RRR Lungs: CTAB Abdomen:Active BS. PD catheter RLQ of abdomen, Drsg CDI.  Extremities:Open ulceration plantar aspect of L foot under toes. R foot with foul odor, ulcerations and eschar toward heel. Now both feet drsgs.  Dialysis Access: PD catheter. No AVF/AVG    Additional Objective Labs: Basic Metabolic Panel: Recent Labs  Lab 03/20/19 1538 03/21/19 0914 03/22/19 0356  NA 132* 132* 135  K 4.8 4.3 4.2  CL 97* 97* 97*  CO2 14* 15* 19*  GLUCOSE 216* 248* 258*  BUN 88* 80* 81*  CREATININE 14.95* 13.69* 12.87*  CALCIUM 8.5* 8.5* 8.5*  PHOS 8.4* 7.4* 6.5*   Liver Function Tests: Recent Labs  Lab 03/19/19 1541 03/19/19 1541 03/20/19 1538 03/21/19 0914 03/22/19 0356  AST 10*  --   --   --   --   ALT 10  --   --   --   --   ALKPHOS 69  --   --   --   --   BILITOT 0.7  --   --   --   --   PROT 7.5  --   --   --   --   ALBUMIN 2.1*   < > 2.0* 1.8* 1.7*   < > = values in this interval not displayed.   No results for input(s): LIPASE, AMYLASE in the last 168 hours. CBC: Recent Labs  Lab 03/19/19 1541 03/19/19 1541 03/20/19 0038 03/20/19 0038 03/20/19 1541 03/21/19 0914 03/22/19 0356  WBC 16.2*   < > 15.5*  --   --  13.6* 14.0*  NEUTROABS 10.5*  --  12.0*  --   --   --   --   HGB 7.7*   < > 5.4*   < > 9.1* 9.3* 8.9*  HCT 25.8*   < > 17.1*   < > 27.6* 29.0* 27.8*  MCV 99.6  --  94.5  --   --  93.2 93.0  PLT 292   < > 279  --   --  240 265   < > = values in this  interval not displayed.   Blood Culture    Component Value Date/Time   SDES BLOOD SITE NOT SPECIFIED 03/19/2019 2016   SPECREQUEST  03/19/2019 2016    BOTTLES DRAWN AEROBIC AND ANAEROBIC Blood Culture adequate volume   CULT  03/19/2019 2016    NO GROWTH 2 DAYS Performed at [facility], [place] [address] [place], [place] [address]    REPTSTATUS PENDING 03/19/2019 2016    Cardiac Enzymes: No results for input(s): CKTOTAL, CKMB, CKMBINDEX, TROPONINI in the last 168 hours. CBG: Recent Labs  Lab 03/21/19 1938 03/22/19 0002 03/22/19 0410 03/22/19 0840 03/22/19 1141  GLUCAP 197* 342* 244* 134* 197*   Iron Studies:  Recent Labs    03/20/19 0038  IRON 38*  TIBC 162*  FERRITIN 1,688*   @  lablastinr3@ Studies/Results: VAS [place] ABI WITH/WO TBI  Result Date: 03/21/2019 LOWER EXTREMITY DOPPLER STUDY Indications: Gangrene. High Risk Factors: Hypertension, Diabetes.  Limitations: Today's exam was limited due to an open wound. Comparison Study: No prior study Performing Technologist: Maudry Mayhew MHA, RVT, RDCS, RDMS  Examination Guidelines: A complete evaluation includes at minimum, Doppler waveform signals and systolic blood pressure reading at the level of bilateral brachial, anterior tibial, and posterior tibial arteries, when vessel segments are accessible. Bilateral testing is considered an integral part of a complete examination. Photoelectric Plethysmograph (PPG) waveforms and toe systolic pressure readings are included as required and additional duplex testing as needed. Limited examinations for reoccurring indications may be performed as noted.  ABI Findings: +--------+------------------+-----+----------+--------+ Right   Rt Pressure (mmHg)IndexWaveform  Comment  +--------+------------------+-----+----------+--------+ [id number]                    triphasic          +--------+------------------+-----+----------+--------+ PTA     255               1.00  monophasic         +--------+------------------+-----+----------+--------+ DP      255               1.00 monophasic         +--------+------------------+-----+----------+--------+ +--------+------------------+-----+----------+-------+ Left    Lt Pressure (mmHg)IndexWaveform  Comment +--------+------------------+-----+----------+-------+ [id number]                    triphasic         +--------+------------------+-----+----------+-------+ PTA                            absent            +--------+------------------+-----+----------+-------+ DP      254               1.00 monophasic        +--------+------------------+-----+----------+-------+  Summary: Right: Resting right ankle-brachial index indicates noncompressible right lower extremity arteries. Left: Resting left ankle-brachial index indicates noncompressible left lower extremity arteries.  *See table(s) above for measurements and observations.  Electronically signed by Harold Barban MD on 03/21/2019 at 7:08:18 PM.   Final    Medications: . sodium chloride    . dialysis solution 2.5% low-MG/low-CA    . piperacillin-tazobactam (ZOSYN)  IV 2.25 g (03/22/19 0544)  . vancomycin     . amLODipine  10 mg Oral Daily  . atorvastatin  20 mg Oral q1800  . calcitRIOL  0.5 mcg Oral Once per day on Mon Wed Fri  . cloNIDine  0.1 mg Oral QHS  . feeding supplement (PRO-STAT SUGAR FREE 64)  30 mL Oral TID BM  . furosemide  80 mg Oral Daily  . gentamicin cream  1 application Topical Daily  . insulin aspart  0-6 Units Subcutaneous Q4H  . insulin glargine  22 Units Subcutaneous Daily  . multivitamin  1 tablet Oral QHS  . sevelamer carbonate  2,400 mg Oral TID WC  . sodium bicarbonate  1,300 mg Oral TID  . sodium chloride flush  3 mL Intravenous Q12H  . sodium chloride flush  3 mL Intravenous Q12H  . vancomycin variable dose per unstable renal function (pharmacist dosing)   Does not apply See admin instructions     Dialysis  Orders: Center:[facility] CCPD 7 Days weekly 80 kg 4 exchanges 1.6 mls Dwell time 1.5 hr, No  pause or day dwells. Uses mostly 2.5% solution  BMD meds: -Renvela 800 mg 3 tabs PO TID with meals -Calcitriol 0.5 mcg PO TIW (MWF)   Assessment/Plan: 1. Gangrene/Possible Osteo R Foot/Ulceration L foot: Seen by Dr. Sharol Given who is recommending bilateral BKAs. Patient not ready to make decision at present. Appears in denial. Discussed risk of fatal infection again. On ABX per primary 2. DM/Hyperglycemia: BS 300s CO2 15 AG 20 Per primary 3. ESRD - Continue CCPD per home orders. K+ 4.2 today. Na 128 on adm. Na improved to 135. Questioning OP compliance with PD. Continue 2.5% solution all exchanges.  4. Hypertension/volume - BP and volume appear controlled. No antihypertensive meds noted on OP med list. Continue PD with same orders.Wt lower here than OP clinic. Again question pt compliance.  5. Anemia - HGB initially 7.7 fell this AM to 5.4 on adm. Transfused two units PRBCs 02/20/19 HGB 8.9 today. Had Mircera 225 mcg IV SQ and Venofer 200 mg IV 37/62/8315. 6. Metabolic bone disease - Hyperphosphatemia noted. C Ca 10.3 Continue binders, decrease calcitriol 0.25 mcg PO TIW 7. Nutrition - Albumin low add prostat, renal vits.  Jorge Sylvia H. Henrine Hayter NP-C 03/22/2019, 12:34 PM  [employer] [phone]

## 2019-03-22 NOTE — Progress Notes (Addendum)
Pharmacy Antibiotic Note  Jorge [name] is a 49 y.o. male admitted on 03/19/2019 with Wound infection.  Pharmacy has been consulted for Zosyn and Vancomycin dosing. Patient is current afebrile, but with elevated WBC at 14 (down from 16.2), CRP elevated at 12.4 (down from 15.9) Patient is receiving zosyn 2.5 gram q8h per PD dosing guidelines.  Patient received loading dose of vancomycin 1750 mg on 3/11 @ ~2200.  Vanc random this morning (3/14) at 0356 returned 20 mcg/mL.   Height: 5\' 11"  (180.3 cm) Weight: 153 lb 14.1 oz (69.8 kg) IBW/kg (Calculated) : 75.3  Temp (24hrs), Avg:98.3 F (36.8 C), Min:98 F (36.7 C), Max:98.8 F (37.1 C)  Recent Labs  Lab 03/19/19 1541 03/19/19 1730 03/20/19 0038 03/20/19 1538 03/21/19 0914 03/22/19 0356  WBC 16.2*  --  15.5*  --  13.6* 14.0*  CREATININE 13.93*  --  14.08* 14.95* 13.69* 12.87*  LATICACIDVEN 2.4* 1.4  --   --   --   --   VANCORANDOM  --   --   --   --   --  20    Estimated Creatinine Clearance: 6.8 mL/min (A) (by C-G formula based on SCr of 12.87 mg/dL (H)).    No Known Allergies  Antimicrobials this admission: 3/11 Zosyn >>  3/11 Vancomycin >>   Dose adjustments this admission: n/a  Microbiology results: 3/11 covid negative 3/11 blood cultures NGTD 3/12 surgical PCR MRSA negative  Plan:  - Patient is currently on intermittent Peritoneal Dialysis  - Continue Zosyn 2.25g IV q8hr   - Give Vancomycin 500mg  IV x1 - Will dose vancomycin based on levels  Thank you for allowing pharmacy to be a part of this patient's care.  Thank you for the interesting consult and for involving pharmacy in this patient's care.  Tamela Gammon, PharmD, BCPS 03/22/2019 10:32 AM PGY-2 Pharmacy Administration Resident Please check [email or web address] for unit-specific pharmacist phone numbers

## 2019-03-23 ENCOUNTER — Other Ambulatory Visit: Payer: Self-pay | Admitting: Physician Assistant

## 2019-03-23 LAB — RENAL FUNCTION PANEL
Albumin: 1.6 g/dL — ABNORMAL LOW (ref 3.5–5.0)
Anion gap: 17 — ABNORMAL HIGH (ref 5–15)
BUN: 84 mg/dL — ABNORMAL HIGH (ref 6–20)
CO2: 18 mmol/L — ABNORMAL LOW (ref 22–32)
Calcium: 8.2 mg/dL — ABNORMAL LOW (ref 8.9–10.3)
Chloride: 98 mmol/L (ref 98–111)
Creatinine, Ser: 12.95 mg/dL — ABNORMAL HIGH (ref 0.61–1.24)
GFR calc Af Amer: 5 mL/min — ABNORMAL LOW (ref >60)
GFR calc non Af Amer: 4 mL/min — ABNORMAL LOW (ref >60)
Glucose, Bld: 205 mg/dL — ABNORMAL HIGH (ref 70–99)
Phosphorus: 6.5 mg/dL — ABNORMAL HIGH (ref 2.5–4.6)
Potassium: 4.3 mmol/L (ref 3.5–5.1)
Sodium: 133 mmol/L — ABNORMAL LOW (ref 135–145)

## 2019-03-23 LAB — GLUCOSE, CAPILLARY
Glucose-Capillary: 148 mg/dL — ABNORMAL HIGH (ref 70–99)
Glucose-Capillary: 172 mg/dL — ABNORMAL HIGH (ref 70–99)
Glucose-Capillary: 173 mg/dL — ABNORMAL HIGH (ref 70–99)
Glucose-Capillary: 193 mg/dL — ABNORMAL HIGH (ref 70–99)
Glucose-Capillary: 195 mg/dL — ABNORMAL HIGH (ref 70–99)
Glucose-Capillary: 207 mg/dL — ABNORMAL HIGH (ref 70–99)

## 2019-03-23 LAB — CBC
HCT: 27.5 % — ABNORMAL LOW (ref 39.0–52.0)
Hemoglobin: 9 g/dL — ABNORMAL LOW (ref 13.0–17.0)
MCH: 30.4 pg (ref 26.0–34.0)
MCHC: 32.7 g/dL (ref 30.0–36.0)
MCV: 92.9 fL (ref 80.0–100.0)
Platelets: 264 10*3/uL (ref 150–400)
RBC: 2.96 MIL/uL — ABNORMAL LOW (ref 4.22–5.81)
RDW: 14 % (ref 11.5–15.5)
WBC: 15.1 10*3/uL — ABNORMAL HIGH (ref 4.0–10.5)
nRBC: 0 % (ref 0.0–0.2)

## 2019-03-23 LAB — SEDIMENTATION RATE: Sed Rate: >140 mm/hr — ABNORMAL HIGH (ref 0–16)

## 2019-03-23 LAB — C-REACTIVE PROTEIN: CRP: 10.1 mg/dL — ABNORMAL HIGH (ref <1.0)

## 2019-03-23 NOTE — Progress Notes (Signed)
Patient ID: Jorge [name], male   DOB: 09-19-1970, 49 y.o.   MRN: [id number] I had a meeting this morning with the patient and his family at bedside.  Discussed that with the patient's severe peripheral vascular disease gangrene and osteomyelitis of both feet his best option is to proceed with bilateral transtibial amputations.  Recommended proceeding with surgery this week.  Family state they understand all their questions were answered and they will make a decision soon.

## 2019-03-23 NOTE — Progress Notes (Signed)
[facility] Progress Note   Dialysis Orders: Center:[facility] CCPD 7 Days weekly 80 kg 4 exchanges 1.6 mls Dwell time 1.5 hr, No pause or day dwells. Uses mostly 2.5% solution  BMD meds: -Renvela 800 mg 3 tabs PO TID with meals -Calcitriol 0.5 mcg PO TIW (MWF) - dose lowered due to   Assessment/Plan: 1.  Gangrene/ bilateral Osteo feet with sever PVD- has agreed to bilateral  BKA later this weekOn Vanc and Zosyn.  Because he is on PD and does not have another dialysis access, it would be ideal for him to go to in house CIR post surgery as SNFs do not accommodate peritoneal dialysis. He is agreeable and would prefer this as well. 2. DM/Hyperglycemia: Per primary 3. ESRD - Continue CCPD per home orders. K+4.3. Have been using  2.5% solution all exchanges.On Na bicarb and levels still low - d/c lasix - little UOP 4. Hypertension/volume - BP and volume appear controlled. No antihypertensive meds noted on OP med list. Continue PD with same orders.Wt lower here than OP clinic. 5. Anemia - HGB initially 7.7 fell  to 5.4 3/12 and transfused two units PRBCs hgb now 9. Heme neg 3/11  Had Mircera 225 mcg and Venofer 200 mg IV 03/18/2019. 3/12 tsat 23% ferritin 1688 CRP 15.9 6. Metabolic bone disease -Continue binders/ VDRA. P improving - changed to renal carb mod to help limit P 7.  Nutrition - Albumin low add prostat, renal vits.   Myriam Jacobson, PA-C [place] [phone] 03/23/2019,8:54 AM  LOS: 4 days   Subjective:   Agreeable to surgery.  Intake fairly good. Alb very low. PD likely contributory plus infection. Had some issue with fibrin last night but was able to correct  Objective Vitals:   03/23/19 0406 03/23/19 0737 03/23/19 0745 03/23/19 0803  BP: (!) 143/71 140/75 134/80   Pulse: 68 71 68   Resp: 17 18 16    Temp: 98.2 F (36.8 C) 98.2 F (36.8 C) 97.8 F (36.6 C)   TempSrc: Oral Oral Oral   SpO2: 100% 99% 100%    Weight:    70.9 kg  Height:       Physical Exam General: pleasant thin male playing games on cell phone NAD Heart: RRR Lungs: no rales Abdomen: soft NT Extremities: no sig LE edema, wounds not examined, very foul odor. Dialysis Access: PD cath-RLQ only access   Additional Objective  Intake/Output Summary (Last 24 hours) at 03/23/2019 0931 Last data filed at 03/23/2019 0600 [place] per 24 hour  Intake 850 ml  Output 100 ml  Net 750 ml   Labs: Basic Metabolic Panel: Recent Labs  Lab 03/21/19 0914 03/22/19 0356 03/23/19 0123  NA 132* 135 133*  K 4.3 4.2 4.3  CL 97* 97* 98  CO2 15* 19* 18*  GLUCOSE 248* 258* 205*  BUN 80* 81* 84*  CREATININE 13.69* 12.87* 12.95*  CALCIUM 8.5* 8.5* 8.2*  PHOS 7.4* 6.5* 6.5*   Liver Function Tests: Recent Labs  Lab 03/19/19 1541 03/20/19 1538 03/21/19 0914 03/22/19 0356 03/23/19 0123  AST 10*  --   --   --   --   ALT 10  --   --   --   --   ALKPHOS 69  --   --   --   --   BILITOT 0.7  --   --   --   --   PROT 7.5  --   --   --   --  ALBUMIN 2.1*   < > 1.8* 1.7* 1.6*   < > = values in this interval not displayed.   No results for input(s): LIPASE, AMYLASE in the last 168 hours. CBC: Recent Labs  Lab 03/19/19 1541 03/19/19 1541 03/20/19 0038 03/20/19 1541 03/21/19 0914 03/22/19 0356 03/23/19 0123  WBC 16.2*   < > 15.5*  --  13.6* 14.0* 15.1*  NEUTROABS 10.5*  --  12.0*  --   --   --   --   HGB 7.7*   < > 5.4*   < > 9.3* 8.9* 9.0*  HCT 25.8*   < > 17.1*   < > 29.0* 27.8* 27.5*  MCV 99.6  --  94.5  --  93.2 93.0 92.9  PLT 292   < > 279  --  240 265 264   < > = values in this interval not displayed.   Blood Culture    Component Value Date/Time   SDES BLOOD SITE NOT SPECIFIED 03/19/2019 2016   SPECREQUEST  03/19/2019 2016    BOTTLES DRAWN AEROBIC AND ANAEROBIC Blood Culture adequate volume   CULT  03/19/2019 2016    NO GROWTH 3 DAYS Performed at [facility], [place] [address], [place], [place] [address]     REPTSTATUS PENDING 03/19/2019 2016    Cardiac Enzymes: No results for input(s): CKTOTAL, CKMB, CKMBINDEX, TROPONINI in the last 168 hours. CBG: Recent Labs  Lab 03/22/19 1704 03/22/19 1950 03/23/19 0017 03/23/19 0402 03/23/19 0834  GLUCAP 208* 131* 195* 193* 148*   Iron Studies: No results for input(s): IRON, TIBC, TRANSFERRIN, FERRITIN in the last 72 hours. No results found for: INR, PROTIME Studies/Results: No results found. Medications: . sodium chloride    . dialysis solution 2.5% low-MG/low-CA    . piperacillin-tazobactam (ZOSYN)  IV 2.25 g (03/23/19 0511)   . amLODipine  10 mg Oral Daily  . atorvastatin  20 mg Oral q1800  . calcitRIOL  0.5 mcg Oral Once per day on Mon Wed Fri  . cloNIDine  0.1 mg Oral QHS  . feeding supplement (PRO-STAT SUGAR FREE 64)  30 mL Oral TID BM  . furosemide  80 mg Oral Daily  . gentamicin cream  1 application Topical Daily  . insulin aspart  0-6 Units Subcutaneous Q4H  . insulin glargine  22 Units Subcutaneous Daily  . multivitamin  1 tablet Oral QHS  . sevelamer carbonate  2,400 mg Oral TID WC  . sodium bicarbonate  1,300 mg Oral TID  . sodium chloride flush  3 mL Intravenous Q12H  . sodium chloride flush  3 mL Intravenous Q12H  . vancomycin variable dose per unstable renal function (pharmacist dosing)   Does not apply See admin instructions

## 2019-03-23 NOTE — Progress Notes (Signed)
Hospitalist progress note   Patient from home, Patient going unclear at this time, [name] await patient's decision about surgery versus not and decide accordingly  Jorge [name] [id number] DOB: 1970-04-03 DOA: 03/19/2019  PCP: Patient, No Pcp Per   Narrative:  2 M known DM TY 2 complicated by nephropathy as well as diabetic gastroparesis, ESRD on PD, anemia of renal disease-he has been seen numerous times in the emergency room for gastroparesis-he was last seen in the ED 02/11/2019 for the same and was sent home as he was doing fair He returned to [facility] ED 03/19/2019 as he had developed an ulcer plantar aspect left foot and also with an ulceration of the right Glucose 321 BUN/creatinine 78/13, bicarb 13, sodium 129 potassium 5.3 anion gap 21 Foot x-ray left foot = soft tissue defect subjacent to plantar calcaneus with ulceration no evidence of osteomyelitis Foot x-ray right foot = soft tissue gas projects over first and second MTP joint suspicious gangrene erosions and bony destruction second toe proximal phalanx proximal great toe suspicious of osteomyelitis  Declining surgery and asking fro non-operative opinion--await family coming from out of town to Location manager  Data Reviewed:  Sodium 129--> 135-->133 Potassium--> 4.318 CO2 13--> 19 , BUN/creatinine 82/14.08--> 84/12.9 White count 16.2--> 15.1 Hemoglobin 5.4--2 u prbc on 03/20/19--->9.3--> 9.0 ESR 140 Assessment & Plan:  Sepsis secondary osteomyelitis of the first and second MTP joints with possible gas gangrene Apparently he has agreed to surgery? Feet insensate-discontinued fentanyl as he is not using Anemia of critical illness/renal disease-baseline hemoglobin in 2019 was 13 Hemoccult 3/11 was negative Ferritin elevated 2/2 inflammation--transfused as above-IV iron/Mircera dosing per renal ESRD on PD secondary to DM TY 2 Elevated anion gap Likely hypervolemic hyponatremia from missed dialysis Nephrologist following continue  Lasix 80 daily, Renvela 2.4 g 3 times daily, correct acidosis with sodium bicarb increased to 1.3 g 3 times daily Labs are within his usual realm Renal acidosis is stable DM TY 2 nephropathy gastroparesis and uncontrolled hyperglycemia [Home regimen 3 to 13 units 3 times daily as needed, 10 units Lantus] CBG somewhat uncontrolled 1 48-1 93 Continue sliding scale every 4 sensitive, increase Lantus 10---> 22 units HTN Resuming clonidine 0.1 HF, amlodipine 10 daily--hold  hydralazine 150 twice daily,   Subjective:  When I spoke with him earlier in the day he was still refusing surgery Now he seems to come around to the idea of surgery No new other issues  Consultants:   Orthopedics  Objective: Vitals:   03/23/19 0737 03/23/19 0745 03/23/19 0803 03/23/19 1219  BP: 140/75 134/80  (!) 167/76  Pulse: 71 68  71  Resp: 18 16  18   Temp: 98.2 F (36.8 C) 97.8 F (36.6 C)  98.2 F (36.8 C)  TempSrc: Oral Oral  Oral  SpO2: 99% 100%  98%  Weight:   70.9 kg   Height:        Intake/Output Summary (Last 24 hours) at 03/23/2019 1556 Last data filed at 03/23/2019 0600 [place] per 24 hour  Intake 360 ml  Output 100 ml  Net 260 ml   Filed Weights   03/22/19 0753 03/22/19 2104 03/23/19 0803  Weight: 69.8 kg 71.4 kg 70.9 kg    Examination:  Awake coherent no distress EOMI NCAT no focal deficit S1-S2 no new murmur rub or gallop Chest clear no added sound Neuro intact moving all 4 limbs Skin exam Wound Has not been examined today  Scheduled Meds: . amLODipine  10 mg Oral Daily  .  atorvastatin  20 mg Oral q1800  . calcitRIOL  0.5 mcg Oral Once per day on Mon Wed Fri  . cloNIDine  0.1 mg Oral QHS  . feeding supplement (PRO-STAT SUGAR FREE 64)  30 mL Oral TID BM  . furosemide  80 mg Oral Daily  . gentamicin cream  1 application Topical Daily  . insulin aspart  0-6 Units Subcutaneous Q4H  . insulin glargine  22 Units Subcutaneous Daily  . multivitamin  1 tablet Oral QHS  .  sevelamer carbonate  2,400 mg Oral TID WC  . sodium bicarbonate  1,300 mg Oral TID  . sodium chloride flush  3 mL Intravenous Q12H  . sodium chloride flush  3 mL Intravenous Q12H  . vancomycin variable dose per unstable renal function (pharmacist dosing)   Does not apply See admin instructions   Continuous Infusions: . sodium chloride    . dialysis solution 2.5% low-MG/low-CA    . piperacillin-tazobactam (ZOSYN)  IV 2.25 g (03/23/19 1304)     LOS: 4 days   Time spent: [place], MD [facility]  03/23/2019, 3:56 PM

## 2019-03-23 NOTE — Plan of Care (Signed)
  Problem: Clinical Measurements: Goal: Ability to maintain clinical measurements within normal limits will improve Outcome: Progressing   Problem: Skin Integrity: Goal: Risk for impaired skin integrity will decrease Outcome: Not Progressing

## 2019-03-23 NOTE — Plan of Care (Signed)
  Problem: Clinical Measurements: Goal: Respiratory complications will improve Outcome: Progressing Goal: Cardiovascular complication will be avoided Outcome: Progressing   Problem: Pain Managment: Goal: General experience of comfort will improve Outcome: Progressing   Problem: Safety: Goal: Ability to remain free from injury will improve Outcome: Progressing   Problem: Skin Integrity: Goal: Risk for impaired skin integrity will decrease Outcome: Progressing   

## 2019-03-24 DIAGNOSIS — T148XXA Other injury of unspecified body region, initial encounter: Secondary | ICD-10-CM

## 2019-03-24 LAB — RENAL FUNCTION PANEL
Albumin: 1.6 g/dL — ABNORMAL LOW (ref 3.5–5.0)
Anion gap: 20 — ABNORMAL HIGH (ref 5–15)
BUN: 76 mg/dL — ABNORMAL HIGH (ref 6–20)
CO2: 21 mmol/L — ABNORMAL LOW (ref 22–32)
Calcium: 8.5 mg/dL — ABNORMAL LOW (ref 8.9–10.3)
Chloride: 94 mmol/L — ABNORMAL LOW (ref 98–111)
Creatinine, Ser: 12.4 mg/dL — ABNORMAL HIGH (ref 0.61–1.24)
GFR calc Af Amer: 5 mL/min — ABNORMAL LOW (ref >60)
GFR calc non Af Amer: 4 mL/min — ABNORMAL LOW (ref >60)
Glucose, Bld: 232 mg/dL — ABNORMAL HIGH (ref 70–99)
Phosphorus: 6.4 mg/dL — ABNORMAL HIGH (ref 2.5–4.6)
Potassium: 3.8 mmol/L (ref 3.5–5.1)
Sodium: 135 mmol/L (ref 135–145)

## 2019-03-24 LAB — CULTURE, BLOOD (ROUTINE X 2)
Culture: NO GROWTH
Culture: NO GROWTH
Special Requests: ADEQUATE

## 2019-03-24 LAB — CBC
HCT: 29 % — ABNORMAL LOW (ref 39.0–52.0)
Hemoglobin: 9.2 g/dL — ABNORMAL LOW (ref 13.0–17.0)
MCH: 30 pg (ref 26.0–34.0)
MCHC: 31.7 g/dL (ref 30.0–36.0)
MCV: 94.5 fL (ref 80.0–100.0)
Platelets: 274 10*3/uL (ref 150–400)
RBC: 3.07 MIL/uL — ABNORMAL LOW (ref 4.22–5.81)
RDW: 14.2 % (ref 11.5–15.5)
WBC: 13.3 10*3/uL — ABNORMAL HIGH (ref 4.0–10.5)
nRBC: 0 % (ref 0.0–0.2)

## 2019-03-24 LAB — GLUCOSE, CAPILLARY
Glucose-Capillary: 106 mg/dL — ABNORMAL HIGH (ref 70–99)
Glucose-Capillary: 109 mg/dL — ABNORMAL HIGH (ref 70–99)
Glucose-Capillary: 152 mg/dL — ABNORMAL HIGH (ref 70–99)
Glucose-Capillary: 187 mg/dL — ABNORMAL HIGH (ref 70–99)
Glucose-Capillary: 204 mg/dL — ABNORMAL HIGH (ref 70–99)
Glucose-Capillary: 208 mg/dL — ABNORMAL HIGH (ref 70–99)

## 2019-03-24 MED ORDER — CHLORHEXIDINE GLUCONATE 4 % EX LIQD
60.0000 mL | Freq: Once | CUTANEOUS | Status: AC
  Administered 2019-03-25: 4 via TOPICAL
  Filled 2019-03-24: qty 60

## 2019-03-24 MED ORDER — INSULIN ASPART 100 UNIT/ML ~~LOC~~ SOLN
0.0000 [IU] | Freq: Three times a day (TID) | SUBCUTANEOUS | Status: DC
  Administered 2019-03-24 (×2): 1 [IU] via SUBCUTANEOUS
  Administered 2019-03-25: 2 [IU] via SUBCUTANEOUS

## 2019-03-24 MED ORDER — CEFAZOLIN SODIUM-DEXTROSE 2-4 GM/100ML-% IV SOLN
2.0000 g | INTRAVENOUS | Status: AC
  Administered 2019-03-25: 2 g via INTRAVENOUS

## 2019-03-24 NOTE — Anesthesia Preprocedure Evaluation (Addendum)
Anesthesia Evaluation  Patient identified by MRN, date of birth, ID band Patient awake    Reviewed: Allergy & Precautions, NPO status , Patient's Chart, lab work & pertinent test results  History of Anesthesia Complications Negative for: history of anesthetic complications  Airway Mallampati: II  TM Distance: >3 FB Neck ROM: Full    Dental   Pulmonary neg pulmonary ROS,    Pulmonary exam normal        Cardiovascular hypertension, + Peripheral Vascular Disease  Normal cardiovascular exam     Neuro/Psych negative neurological ROS  negative psych ROS   GI/Hepatic Neg liver ROS, gastroparesis   Endo/Other  diabetes, Type 2, Insulin Dependent  Renal/GU ESRFRenal disease  negative genitourinary   Musculoskeletal negative musculoskeletal ROS (+)   Abdominal   Peds  Hematology  (+) anemia ,   Anesthesia Other Findings   Reproductive/Obstetrics                           Anesthesia Physical Anesthesia Plan  ASA: IV  Anesthesia Plan: General   Post-op Pain Management: GA combined w/ Regional for post-op pain   Induction: Intravenous and Rapid sequence  PONV Risk Score and Plan: 2 and Ondansetron, Dexamethasone, Treatment may vary due to age or medical condition and Midazolam  Airway Management Planned: Oral ETT  Additional Equipment: None  Intra-op Plan:   Post-operative Plan: Extubation in OR  Informed Consent: I have reviewed the patients History and Physical, chart, labs and discussed the procedure including the risks, benefits and alternatives for the proposed anesthesia with the patient or authorized representative who has indicated his/her understanding and acceptance.     Dental advisory given  Plan Discussed with:   Anesthesia Plan Comments:        Anesthesia Quick Evaluation

## 2019-03-24 NOTE — Progress Notes (Signed)
Hospitalist progress note   Patient from home, , [name] await surgery and therapy eval possibly going to rehab post discharge  OM [name] [id number] DOB: 22-Mar-1970 DOA: 03/19/2019  PCP: Patient, No Pcp Per   Narrative:  76 M known DM TY 2 complicated by nephropathy as well as diabetic gastroparesis, ESRD on PD, anemia of renal disease-he has been seen numerous times in the emergency room for gastroparesis-he was last seen in the ED 02/11/2019 for the same and was sent home as he was doing fair He returned to [facility] ED 03/19/2019 as he had developed an ulcer plantar aspect left foot and also with an ulceration of the right Glucose 321 BUN/creatinine 78/13, bicarb 13, sodium 129 potassium 5.3 anion gap 21 Foot x-ray left foot = soft tissue defect subjacent to plantar calcaneus with ulceration no evidence of osteomyelitis Foot x-ray right foot = soft tissue gas projects over first and second MTP joint suspicious gangrene erosions and bony destruction second toe proximal phalanx proximal great toe suspicious of osteomyelitis  Declined surgery initially but made a decision after discussion with family and orthopedics on 3/15 to undergo surgery on 3/17  Data Reviewed:  Sodium 129--> 135-->133--135 Potassium--> 4.318 CO2 13--> 19--21 , BUN/creatinine 82/14.08--> 76/12.4 White count 16.2--> 15.1--13.3 Hemoglobin 5.4--2 u prbc on 03/20/19--->9.3--> 9.0--9.2 ESR 140 Assessment & Plan:  Sepsis secondary osteomyelitis of the first and second MTP joints with possible gas gangrene Continue Zosyn/Vanc at this time Patient scheduled for surgery tomorrow keep n.p.o. after midnight-Dr. Sharol Given will discuss type of surgery with the patient Feet insensate-discontinued fentanyl as he is not using Anemia of critical illness/renal disease-baseline hemoglobin in 2019 was 13 Hemoccult 3/11 was negative Ferritin elevated 2/2 inflammation--transfused as above-IV iron/Mircera dosing per renal ESRD on PD secondary to DM TY  2 Elevated anion gap Likely hypervolemic hyponatremia from missed dialysis Nephrologist following continue Lasix 80 daily, Renvela 2.4 g 3 times daily, correct acidosis with sodium bicarb increased to 1.3 g 3 times daily Labs improved Renal acidosis is stable DM TY 2 nephropathy gastroparesis and uncontrolled hyperglycemia [Home regimen 3 to 13 units 3 times daily as needed, 10 units Lantus] CBG somewhat uncontrolled 106--204 Continue sliding scale every 4 sensitive, continue Lantus 22 units-adjust as accordingly HTN Resuming clonidine 0.1 HF, amlodipine 10 daily--hold  hydralazine 150 twice daily,  No family present at the bedside today and patient has updated them fully   Subjective:  Seem to reconcile to surgery but does not know what type of surgery he will be getting according to him No new issues Getting peritoneal dialysis now No chest pain No fever No pain  Consultants:   Orthopedics  Objective: Vitals:   03/23/19 1826 03/23/19 2007 03/24/19 0309 03/24/19 0734  BP: (!) 153/85 (!) 139/41 (!) 148/56 (!) 175/55  Pulse: 73 77 70 66  Resp: _0 Temp: 98.4 F (36.9 C) 98 F (36.7 C) 98.3 F (36.8 C) 97.8 F (36.6 C)  TempSrc: Oral Oral Oral Oral  SpO2: 100% 100% 100% 100%  Weight: 72.5 kg     Height:        Intake/Output Summary (Last 24 hours) at 03/24/2019 0856 Last data filed at 03/23/2019 2131 Gross per 24 hour  Intake 3 ml  Output --  Net 3 ml   Filed Weights   03/22/19 2104 03/23/19 0803 03/23/19 1826  Weight: 71.4 kg 70.9 kg 72.5 kg    Examination:  Awake coherent no distress sitting in bed Chest clear  no added sound S1-S2 no murmur Abdomen soft no rebound no guarding Neurologically intact moving all 4 limbs The lower extremities are insensate although I did not examine wound today   Scheduled Meds: . amLODipine  10 mg Oral Daily  . atorvastatin  20 mg Oral q1800  . calcitRIOL  0.5 mcg Oral Once per day on Mon Wed Fri  . cloNIDine   0.1 mg Oral QHS  . feeding supplement (PRO-STAT SUGAR FREE 64)  30 mL Oral TID BM  . furosemide  80 mg Oral Daily  . gentamicin cream  1 application Topical Daily  . insulin aspart  0-6 Units Subcutaneous Q4H  . insulin glargine  22 Units Subcutaneous Daily  . multivitamin  1 tablet Oral QHS  . sevelamer carbonate  2,400 mg Oral TID WC  . sodium bicarbonate  1,300 mg Oral TID  . sodium chloride flush  3 mL Intravenous Q12H  . sodium chloride flush  3 mL Intravenous Q12H  . vancomycin variable dose per unstable renal function (pharmacist dosing)   Does not apply See admin instructions   Continuous Infusions: . sodium chloride    . dialysis solution 2.5% low-MG/low-CA    . piperacillin-tazobactam (ZOSYN)  IV 2.25 g (03/24/19 0504)     LOS: 5 days   Time spent: [place], MD [facility]  03/24/2019, 8:56 AM

## 2019-03-24 NOTE — Progress Notes (Signed)
Pharmacy Antibiotic Note  Jorge [name] is a 49 y.o. male admitted on 03/19/2019 with gangrene and osteomyelitis of both feet.  Pharmacy has been consulted for Vancomycin and Zosyn dosing.  Continues on CCPD as prior to admission.     Day # 6 antibiotics. Planning bilateral BKA on 3/17 at 10am     Last Vancomycin dose of 500 mg IV given on 3/14 when random Vanc level was 20 mcg/ml.   Loading dose of Vanc 1750 mg IV given 3/11 pm.  Plan:  Will check random Vancomycin level in am.  Redose Vanc pre-op if Vanc level < 20 mcg/ml  Continue Zosyn 2.25 gm IV q8hrs  Follow up antibiotic plan post-op.   Height: 5\' 11"  (180.3 cm) Weight: 159 lb 13.3 oz (72.5 kg)(weighed in bed ) IBW/kg (Calculated) : 75.3  Temp (24hrs), Avg:97.9 F (36.6 C), Min:97.4 F (36.3 C), Max:98.4 F (36.9 C)  Recent Labs  Lab 03/19/19 1541 03/19/19 1541 03/19/19 1730 03/20/19 0038 03/20/19 0038 03/20/19 1538 03/21/19 0914 03/22/19 0356 03/23/19 0123 03/24/19 0402  WBC 16.2*   < >  --  15.5*  --   --  13.6* 14.0* 15.1* 13.3*  CREATININE 13.93*   < >  --  14.08*   < > 14.95* 13.69* 12.87* 12.95* 12.40*  LATICACIDVEN 2.4*  --  1.4  --   --   --   --   --   --   --   VANCORANDOM  --   --   --   --   --   --   --  20  --   --    < > = values in this interval not displayed.    Estimated Creatinine Clearance: 7.3 mL/min (A) (by C-G formula based on SCr of 12.4 mg/dL (H)).    No Known Allergies  Antimicrobials this admission:  Vancomycin 3/11 >>  Zosyn 3/11 >>  Microbiology results:  3/11 blood x 2: negative  3/11 COVID: negative  3/12 MRSA PCR: negative  Thank you for allowing pharmacy to be a part of this patient's care.  Arty Baumgartner, [place] Phone: [phone] 03/24/2019 12:31 PM

## 2019-03-24 NOTE — Progress Notes (Signed)
Writer connected pt to [facility]. Pt denies complaint. Heparin added to fill bag.

## 2019-03-24 NOTE — Progress Notes (Signed)
Patient ID: Jorge [name], male   DOB: Aug 14, 1970, 49 y.o.   MRN: [id number] After consulting with his family patient is in agreement to proceed with bilateral transtibial amputations.  We will plan for surgery tomorrow, Wednesday.  Patient may ambulate independently in his room today.

## 2019-03-24 NOTE — Plan of Care (Signed)
  Problem: Nutrition: Goal: Adequate nutrition will be maintained Outcome: Progressing   

## 2019-03-24 NOTE — Progress Notes (Addendum)
Inpatient Diabetes Program Recommendations  AACE/ADA: New Consensus Statement on Inpatient Glycemic Control (2015)  Target Ranges:  Prepandial:   less than 140 mg/dL      Peak postprandial:   less than 180 mg/dL (1-2 hours)      Critically ill patients:  140 - 180 mg/dL   Lab Results  Component Value Date   GLUCAP 204 (H) 03/24/2019   HGBA1C 7.8 (H) 03/20/2019    Review of Glycemic Control Results for [name], [name] (MRN [id number]) as of 03/24/2019 08:39  Ref. Range 03/23/2019 08:34 03/23/2019 12:16 03/23/2019 16:20 03/23/2019 20:09 03/24/2019 00:16 03/24/2019 04:14  Glucose-Capillary Latest Ref Range: 70 - 99 mg/dL 148 (H) 172 (H)  Novolog 1 unit 173 (H)  Novolog 1 unit 207 (H)  Novolog 2 units 208 (H)  Novolog 2 units 204 (H)  Novolog 2 units   Diabetes history: DM 2 Outpatient Diabetes medications: Lantus 10 QHS + Regular 3-13 units TID per SSI  Current orders for Inpatient glycemic control:  Lantus 22 units Novolog 0-6 units Q4 hours  Inpatient Diabetes Program Recommendations:    Pt may benefit from Novolog 2 units tid meal coverage if eating >50% of meals. Hold if NPO.  Thanks,  Tama Headings RN, MSN, BC-ADM Inpatient Diabetes Coordinator Team Pager [phone] (8a-5p)

## 2019-03-24 NOTE — Progress Notes (Signed)
[facility] Progress Note   Dialysis Orders: Center:[facility] CCPD 7 Days weekly 80 kg 4 exchanges 1.6 mls Dwell time 1.5 hr, No pause or day dwells. Uses mostly 2.5% solution  BMD meds: -Renvela 800 mg 3 tabs PO TID with meals -Calcitriol 0.5 mcg PO TIW (MWF) - dose lowered due to   Assessment/Plan: 1.  Gangrene/ bilateral Osteo feet with sever PVD- has agreed to bilateral  BKA planned for tomorrow at 10:15 - will need to be to preop by 9 . On Vanc and Zosyn.  Because he is on PD and does not have another dialysis access, it would be ideal for him to go to in house CIR post surgery as SNFs do not accommodate peritoneal dialysis. He is agreeable and would prefer this as well. 2. DM/Hyperglycemia: Per primary - sugars 200s 3. ESRD - Continue CCPD per home orders. K+3.8. Have been using  2.5% solution all exchanges. d/c'd lasix - little UOP on Na bicarb - bicarb up to 21- labs drawn part way through CCPD treatment- net UF not yet recorded 4. Hypertension/volume - BP hgiher today. No antihypertensive meds noted on OP med list. Amlodipine started 3/13 Continue PD with same orders.Wt lower here than OP clinic. 5. Anemia - HGB initially 7.7 fell  to 5.4 3/12 and transfused two units PRBCs hgb now 9.s Heme neg 3/11  Had Mircera 225 mcg and Venofer 200 mg IV 03/18/2019. 3/12 tsat 23% ferritin 1688 CRP 15.9 6. Metabolic bone disease -Continue binders/ VDRA. P improving - changed to renal carb mod to help limit P 7.  Nutrition - Albumin low add prostat, renal vits.   Myriam Jacobson, PA-C [facility] Beeper [phone] 03/24/2019,8:57 AM  LOS: 5 days   Subjective:   Hasn't ordered breakfast yet - no new issues - discussed scheduling CCPD around surgery   Objective Vitals:   03/23/19 1826 03/23/19 2007 03/24/19 0309 03/24/19 0734  BP: (!) 153/85 (!) 139/41 (!) 148/56 (!) 175/55  Pulse: 73 77 70 66  Resp: 18 18 17 17   Temp: 98.4 F (36.9  C) 98 F (36.7 C) 98.3 F (36.8 C) 97.8 F (36.6 C)  TempSrc: Oral Oral Oral Oral  SpO2: 100% 100% 100% 100%  Weight: 72.5 kg     Height:       Physical Exam General: pleasant slender male  NAD Heart: RRR Lungs: no rales Abdomen: soft NT Extremities: no sig LE edema, wounds not examined, very foul odor. Dialysis Access: PD [id number]-RLQ only access   Additional Objective  Intake/Output Summary (Last 24 hours) at 03/24/2019 0857 Last data filed at 03/23/2019 2131 Gross per 24 hour  Intake 3 ml  Output --  Net 3 ml   Labs: Basic Metabolic Panel: Recent Labs  Lab 03/22/19 0356 03/23/19 0123 03/24/19 0402  NA 135 133* 135  K 4.2 4.3 3.8  CL 97* 98 94*  CO2 19* 18* 21*  GLUCOSE 258* 205* 232*  BUN 81* 84* 76*  CREATININE 12.87* 12.95* 12.40*  CALCIUM 8.5* 8.2* 8.5*  PHOS 6.5* 6.5* 6.4*   Liver Function Tests: Recent Labs  Lab 03/19/19 1541 03/20/19 1538 03/22/19 0356 03/23/19 0123 03/24/19 0402  AST 10*  --   --   --   --   ALT 10  --   --   --   --   ALKPHOS 69  --   --   --   --   BILITOT 0.7  --   --   --   --  PROT 7.5  --   --   --   --   ALBUMIN 2.1*   < > 1.7* 1.6* 1.6*   < > = values in this interval not displayed.   No results for input(s): LIPASE, AMYLASE in the last 168 hours. CBC: Recent Labs  Lab 03/19/19 1541 03/19/19 1541 03/20/19 0038 03/20/19 1541 03/21/19 0914 03/21/19 0914 03/22/19 0356 03/23/19 0123 03/24/19 0402  WBC 16.2*   < > 15.5*  --  13.6*   < > 14.0* 15.1* 13.3*  NEUTROABS 10.5*  --  12.0*  --   --   --   --   --   --   HGB 7.7*   < > 5.4*   < > 9.3*   < > 8.9* 9.0* 9.2*  HCT 25.8*   < > 17.1*   < > 29.0*   < > 27.8* 27.5* 29.0*  MCV 99.6   < > 94.5  --  93.2  --  93.0 92.9 94.5  PLT 292   < > 279  --  240   < > 265 264 274   < > = values in this interval not displayed.   Blood Culture    Component Value Date/Time   SDES BLOOD SITE NOT SPECIFIED 03/19/2019 2016   SPECREQUEST  03/19/2019 2016    BOTTLES DRAWN  AEROBIC AND ANAEROBIC Blood Culture adequate volume   CULT  03/19/2019 2016    NO GROWTH 5 DAYS Performed at [facility], [place] [address], [place], [place] [address]    REPTSTATUS 03/24/2019 FINAL 03/19/2019 2016    Cardiac Enzymes: No results for input(s): CKTOTAL, CKMB, CKMBINDEX, TROPONINI in the last 168 hours. CBG: Recent Labs  Lab 03/23/19 1620 03/23/19 2009 03/24/19 0016 03/24/19 0414 03/24/19 0831  GLUCAP 173* 207* 208* 204* 106*   Iron Studies: No results for input(s): IRON, TIBC, TRANSFERRIN, FERRITIN in the last 72 hours. No results found for: INR, PROTIME Studies/Results: No results found. Medications: . sodium chloride    . dialysis solution 2.5% low-MG/low-CA    . piperacillin-tazobactam (ZOSYN)  IV 2.25 g (03/24/19 0504)   . amLODipine  10 mg Oral Daily  . atorvastatin  20 mg Oral q1800  . calcitRIOL  0.5 mcg Oral Once per day on Mon Wed Fri  . cloNIDine  0.1 mg Oral QHS  . feeding supplement (PRO-STAT SUGAR FREE 64)  30 mL Oral TID BM  . furosemide  80 mg Oral Daily  . gentamicin cream  1 application Topical Daily  . insulin aspart  0-6 Units Subcutaneous Q4H  . insulin glargine  22 Units Subcutaneous Daily  . multivitamin  1 tablet Oral QHS  . sevelamer carbonate  2,400 mg Oral TID WC  . sodium bicarbonate  1,300 mg Oral TID  . sodium chloride flush  3 mL Intravenous Q12H  . sodium chloride flush  3 mL Intravenous Q12H  . vancomycin variable dose per unstable renal function (pharmacist dosing)   Does not apply See admin instructions

## 2019-03-24 NOTE — H&P (View-Only) (Signed)
Patient ID: Jorge [name], male   DOB: 20-Mar-1970, 49 y.o.   MRN: [id number] After consulting with his family patient is in agreement to proceed with bilateral transtibial amputations.  We will plan for surgery tomorrow, Wednesday.  Patient may ambulate independently in his room today.

## 2019-03-24 NOTE — Progress Notes (Signed)
Nutrition Follow-up  DOCUMENTATION CODES:   Non-severe (moderate) malnutrition in context of chronic illness  INTERVENTION:   -Continue renal MVI with minerals daily -Continue 30 ml Prostat TID, each supplement provides 100 kcals and 15 grams protein  NUTRITION DIAGNOSIS:   Moderate Malnutrition related to chronic illness(ESRD on PD, DM) as evidenced by mild fat depletion, moderate fat depletion, mild muscle depletion, moderate muscle depletion.  Ongoing  GOAL:   Patient will meet greater than or equal to 90% of their needs  Progressing   MONITOR:   PO intake, Supplement acceptance, Labs, Weight trends, Skin, I & O's  REASON FOR ASSESSMENT:   Consult Wound healing  ASSESSMENT:   Jorge [name] is a 49 y.o. male with medical history significant for ESRD on peritoneal dialysis, hypertension, insulin-dependent diabetes mellitus, and anemia, now presenting to emergency department for evaluation of swelling, drainage, and following up from his right foot.  Patient reports that he has had an ulcer at the plantar aspect of his left foot for at least a couple months and developed ulceration involving the right foot approximately 2 weeks ago.  The right foot has developed some swelling with foul odor and drainage over the past week or so.  He denies any fevers or chills, denies pain, denies any shortness of breath, cough, or sore throat.  Reports that he recently went to the area, is established with a nephrologist, but does not have a PCP yet.  Reviewed I/O's: + 3 ml x 24 hours and +1.9 L since admission  Pt receiving nursing care at time of visit.   Per orthopedics notes, pt and family have consented to bilateral transitbial amputation tomorrow, 03/25/19.   Pt with good appetite; noted meal completion 100%. Pt is also taking Prostat supplements per MAR.   Labs reviewed: Phos: 6.4, CBGS: 173-208 (inpatient orders for glycemic control are 0-6 units insulin aspart every 4 hours and  22 units insulin glargine daily).   Diet Order:   Diet Order            Diet NPO time specified  Diet effective midnight        Diet renal/carb modified with fluid restriction Diet-HS Snack? Nothing; Fluid restriction: 1800 mL Fluid; Room service appropriate? Yes; Fluid consistency: Thin  Diet effective now              EDUCATION NEEDS:   Education needs have been addressed  Skin:  Skin Assessment: Skin Integrity Issues: Skin Integrity Issues:: Diabetic Ulcer Diabetic Ulcer: lt foot, rt foot (unstageable)  Last BM:  03/20/19  Height:   Ht Readings from Last 1 Encounters:  03/21/19 5\' 11"  (1.803 m)    Weight:   Wt Readings from Last 1 Encounters:  03/23/19 72.5 kg    Ideal Body Weight:  78.2 kg  BMI:  Body mass index is 22.29 kg/m.  Estimated Nutritional Needs:   Kcal:  0258-5277  Protein:  120-135 grams  Fluid:  1000 ml + UOP    Loistine Chance, RD, LDN, [place] Registered Dietitian II Certified Diabetes Care and Education Specialist Please refer to [facility] for RD and/or RD on-call/weekend/after hours pager

## 2019-03-25 ENCOUNTER — Inpatient Hospital Stay (HOSPITAL_COMMUNITY): Payer: Medicare (Managed Care) | Admitting: Anesthesiology

## 2019-03-25 ENCOUNTER — Encounter (HOSPITAL_COMMUNITY)
Admission: EM | Disposition: A | Discharge status: Rehabilitation Facility | Payer: Self-pay | Source: Home / Self Care | Attending: Internal Medicine

## 2019-03-25 HISTORY — PX: AMPUTATION: SHX166

## 2019-03-25 LAB — CBC WITH DIFFERENTIAL/PLATELET
Abs Immature Granulocytes: 0.26 10*3/uL — ABNORMAL HIGH (ref 0.00–0.07)
Basophils Absolute: 0.1 10*3/uL (ref 0.0–0.1)
Basophils Relative: 1 %
Eosinophils Absolute: 0.4 10*3/uL (ref 0.0–0.5)
Eosinophils Relative: 3 %
HCT: 30.5 % — ABNORMAL LOW (ref 39.0–52.0)
Hemoglobin: 9.8 g/dL — ABNORMAL LOW (ref 13.0–17.0)
Immature Granulocytes: 2 %
Lymphocytes Relative: 17 %
Lymphs Abs: 2 10*3/uL (ref 0.7–4.0)
MCH: 30.5 pg (ref 26.0–34.0)
MCHC: 32.1 g/dL (ref 30.0–36.0)
MCV: 95 fL (ref 80.0–100.0)
Monocytes Absolute: 1.1 10*3/uL — ABNORMAL HIGH (ref 0.1–1.0)
Monocytes Relative: 9 %
Neutro Abs: 8.2 10*3/uL — ABNORMAL HIGH (ref 1.7–7.7)
Neutrophils Relative %: 68 %
Platelets: 286 10*3/uL (ref 150–400)
RBC: 3.21 MIL/uL — ABNORMAL LOW (ref 4.22–5.81)
RDW: 14.2 % (ref 11.5–15.5)
WBC: 12 10*3/uL — ABNORMAL HIGH (ref 4.0–10.5)
nRBC: 0.2 % (ref 0.0–0.2)

## 2019-03-25 LAB — BASIC METABOLIC PANEL
Anion gap: 20 — ABNORMAL HIGH (ref 5–15)
BUN: 77 mg/dL — ABNORMAL HIGH (ref 6–20)
CO2: 20 mmol/L — ABNORMAL LOW (ref 22–32)
Calcium: 8.5 mg/dL — ABNORMAL LOW (ref 8.9–10.3)
Chloride: 93 mmol/L — ABNORMAL LOW (ref 98–111)
Creatinine, Ser: 12.58 mg/dL — ABNORMAL HIGH (ref 0.61–1.24)
GFR calc Af Amer: 5 mL/min — ABNORMAL LOW (ref >60)
GFR calc non Af Amer: 4 mL/min — ABNORMAL LOW (ref >60)
Glucose, Bld: 280 mg/dL — ABNORMAL HIGH (ref 70–99)
Potassium: 4.2 mmol/L (ref 3.5–5.1)
Sodium: 133 mmol/L — ABNORMAL LOW (ref 135–145)

## 2019-03-25 LAB — VANCOMYCIN, RANDOM: Vancomycin Rm: 21

## 2019-03-25 LAB — GLUCOSE, CAPILLARY
Glucose-Capillary: 185 mg/dL — ABNORMAL HIGH (ref 70–99)
Glucose-Capillary: 187 mg/dL — ABNORMAL HIGH (ref 70–99)
Glucose-Capillary: 203 mg/dL — ABNORMAL HIGH (ref 70–99)
Glucose-Capillary: 205 mg/dL — ABNORMAL HIGH (ref 70–99)
Glucose-Capillary: 227 mg/dL — ABNORMAL HIGH (ref 70–99)
Glucose-Capillary: 255 mg/dL — ABNORMAL HIGH (ref 70–99)
Glucose-Capillary: 261 mg/dL — ABNORMAL HIGH (ref 70–99)
Glucose-Capillary: 277 mg/dL — ABNORMAL HIGH (ref 70–99)

## 2019-03-25 SURGERY — AMPUTATION BELOW KNEE
Anesthesia: General | Site: Knee | Laterality: Bilateral

## 2019-03-25 MED ORDER — FENTANYL CITRATE (PF) 100 MCG/2ML IJ SOLN: 25.0000 ug | INTRAMUSCULAR | Status: DC | PRN

## 2019-03-25 MED ORDER — ONDANSETRON HCL 4 MG/2ML IJ SOLN: 4.0000 mg | Freq: Once | INTRAMUSCULAR | Status: DC | PRN

## 2019-03-25 MED ORDER — SODIUM CHLORIDE 0.9 % IV SOLN: INTRAVENOUS | Status: DC

## 2019-03-25 MED ORDER — ROCURONIUM BROMIDE 10 MG/ML (PF) SYRINGE
PREFILLED_SYRINGE | INTRAVENOUS | Status: DC | PRN
  Administered 2019-03-25: 90 mg via INTRAVENOUS

## 2019-03-25 MED ORDER — METOCLOPRAMIDE HCL 5 MG PO TABS: 5.0000 mg | ORAL_TABLET | Freq: Three times a day (TID) | ORAL | Status: DC | PRN

## 2019-03-25 MED ORDER — HYDROCODONE-ACETAMINOPHEN 5-325 MG PO TABS
1.0000 | ORAL_TABLET | ORAL | Status: DC | PRN
  Administered 2019-03-25 – 2019-03-29 (×3): 2 via ORAL
  Filled 2019-03-25 (×5): qty 2

## 2019-03-25 MED ORDER — MORPHINE SULFATE (PF) 2 MG/ML IV SOLN
0.5000 mg | INTRAVENOUS | Status: DC | PRN
  Administered 2019-03-26 – 2019-03-27 (×4): 1 mg via INTRAVENOUS
  Administered 2019-03-27: 0.5 mg via INTRAVENOUS
  Administered 2019-03-28 – 2019-03-31 (×7): 1 mg via INTRAVENOUS
  Filled 2019-03-25 (×12): qty 1

## 2019-03-25 MED ORDER — PROPOFOL 10 MG/ML IV BOLUS
INTRAVENOUS | Status: DC | PRN
  Administered 2019-03-25: 140 mg via INTRAVENOUS

## 2019-03-25 MED ORDER — OXYCODONE HCL 5 MG PO TABS: 5.0000 mg | ORAL_TABLET | Freq: Once | ORAL | Status: DC | PRN

## 2019-03-25 MED ORDER — OXYCODONE HCL 5 MG/5ML PO SOLN: 5.0000 mg | Freq: Once | ORAL | Status: DC | PRN

## 2019-03-25 MED ORDER — BUPIVACAINE HCL (PF) 0.5 % IJ SOLN
INTRAMUSCULAR | Status: DC | PRN
  Administered 2019-03-25 (×4): 15 mL via PERINEURAL

## 2019-03-25 MED ORDER — MIDAZOLAM HCL 2 MG/2ML IJ SOLN
INTRAMUSCULAR | Status: DC | PRN
  Administered 2019-03-25 (×2): 1 mg via INTRAVENOUS

## 2019-03-25 MED ORDER — ONDANSETRON HCL 4 MG/2ML IJ SOLN
INTRAMUSCULAR | Status: AC
  Filled 2019-03-25: qty 2

## 2019-03-25 MED ORDER — PROPOFOL 10 MG/ML IV BOLUS
INTRAVENOUS | Status: AC
  Filled 2019-03-25: qty 20

## 2019-03-25 MED ORDER — ALBUMIN HUMAN 5 % IV SOLN
12.5000 g | Freq: Once | INTRAVENOUS | Status: AC
  Administered 2019-03-25: 10:00:00 12.5 g via INTRAVENOUS

## 2019-03-25 MED ORDER — FENTANYL CITRATE (PF) 100 MCG/2ML IJ SOLN
INTRAMUSCULAR | Status: DC | PRN
  Administered 2019-03-25 (×2): 50 ug via INTRAVENOUS

## 2019-03-25 MED ORDER — DOCUSATE SODIUM 100 MG PO CAPS
100.0000 mg | ORAL_CAPSULE | Freq: Two times a day (BID) | ORAL | Status: DC
  Administered 2019-03-25 – 2019-03-29 (×6): 100 mg via ORAL
  Filled 2019-03-25 (×13): qty 1

## 2019-03-25 MED ORDER — ROCURONIUM BROMIDE 10 MG/ML (PF) SYRINGE
PREFILLED_SYRINGE | INTRAVENOUS | Status: AC
  Filled 2019-03-25: qty 10

## 2019-03-25 MED ORDER — POLYETHYLENE GLYCOL 3350 17 G PO PACK
17.0000 g | PACK | Freq: Every day | ORAL | Status: DC | PRN
  Administered 2019-03-31: 17 g via ORAL
  Filled 2019-03-25: qty 1

## 2019-03-25 MED ORDER — MAGNESIUM CITRATE PO SOLN: 1.0000 | Freq: Once | ORAL | Status: DC | PRN

## 2019-03-25 MED ORDER — 0.9 % SODIUM CHLORIDE (POUR BTL) OPTIME
TOPICAL | Status: DC | PRN
  Administered 2019-03-25: 1000 mL

## 2019-03-25 MED ORDER — PHENYLEPHRINE 40 MCG/ML (10ML) SYRINGE FOR IV PUSH (FOR BLOOD PRESSURE SUPPORT)
PREFILLED_SYRINGE | INTRAVENOUS | Status: AC
  Filled 2019-03-25: qty 10

## 2019-03-25 MED ORDER — PHENYLEPHRINE HCL-NACL 10-0.9 MG/250ML-% IV SOLN
INTRAVENOUS | Status: DC | PRN
  Administered 2019-03-25: 50 ug/min via INTRAVENOUS

## 2019-03-25 MED ORDER — SUGAMMADEX SODIUM 200 MG/2ML IV SOLN
INTRAVENOUS | Status: DC | PRN
  Administered 2019-03-25: 300 mg via INTRAVENOUS

## 2019-03-25 MED ORDER — ACETAMINOPHEN 325 MG PO TABS: 325.0000 mg | ORAL_TABLET | Freq: Four times a day (QID) | ORAL | Status: DC | PRN

## 2019-03-25 MED ORDER — MIDAZOLAM HCL 2 MG/2ML IJ SOLN
INTRAMUSCULAR | Status: AC
  Filled 2019-03-25: qty 2

## 2019-03-25 MED ORDER — ONDANSETRON HCL 4 MG/2ML IJ SOLN
INTRAMUSCULAR | Status: DC | PRN
  Administered 2019-03-25: 4 mg via INTRAVENOUS

## 2019-03-25 MED ORDER — PHENYLEPHRINE 40 MCG/ML (10ML) SYRINGE FOR IV PUSH (FOR BLOOD PRESSURE SUPPORT)
PREFILLED_SYRINGE | INTRAVENOUS | Status: DC | PRN
  Administered 2019-03-25: 40 ug via INTRAVENOUS
  Administered 2019-03-25: 120 ug via INTRAVENOUS
  Administered 2019-03-25: 160 ug via INTRAVENOUS
  Administered 2019-03-25: 120 ug via INTRAVENOUS
  Administered 2019-03-25: 80 ug via INTRAVENOUS

## 2019-03-25 MED ORDER — METOCLOPRAMIDE HCL 5 MG/ML IJ SOLN: 5.0000 mg | Freq: Three times a day (TID) | INTRAMUSCULAR | Status: DC | PRN

## 2019-03-25 MED ORDER — METHOCARBAMOL 1000 MG/10ML IJ SOLN
500.0000 mg | Freq: Four times a day (QID) | INTRAVENOUS | Status: DC | PRN
  Filled 2019-03-25: qty 5

## 2019-03-25 MED ORDER — PHENYLEPHRINE HCL-NACL 10-0.9 MG/250ML-% IV SOLN
INTRAVENOUS | Status: AC
  Filled 2019-03-25: qty 500

## 2019-03-25 MED ORDER — ALBUMIN HUMAN 5 % IV SOLN
INTRAVENOUS | Status: AC
  Filled 2019-03-25: qty 250

## 2019-03-25 MED ORDER — FENTANYL CITRATE (PF) 100 MCG/2ML IJ SOLN
INTRAMUSCULAR | Status: AC
  Filled 2019-03-25: qty 2

## 2019-03-25 MED ORDER — METHOCARBAMOL 500 MG PO TABS
500.0000 mg | ORAL_TABLET | Freq: Four times a day (QID) | ORAL | Status: DC | PRN
  Administered 2019-03-25: 18:00:00 500 mg via ORAL
  Filled 2019-03-25: qty 1

## 2019-03-25 MED ORDER — BISACODYL 10 MG RE SUPP: 10.0000 mg | Freq: Every day | RECTAL | Status: DC | PRN

## 2019-03-25 MED ORDER — HYDROCODONE-ACETAMINOPHEN 7.5-325 MG PO TABS
1.0000 | ORAL_TABLET | ORAL | Status: DC | PRN
  Administered 2019-03-26 – 2019-03-31 (×8): 2 via ORAL
  Filled 2019-03-25 (×8): qty 2

## 2019-03-25 MED ORDER — ONDANSETRON HCL 4 MG/2ML IJ SOLN: 4.0000 mg | Freq: Four times a day (QID) | INTRAMUSCULAR | Status: DC | PRN

## 2019-03-25 MED ORDER — BUPIVACAINE LIPOSOME 1.3 % IJ SUSP
INTRAMUSCULAR | Status: DC | PRN
  Administered 2019-03-25 (×2): 10 mL

## 2019-03-25 MED ORDER — ONDANSETRON HCL 4 MG PO TABS: 4.0000 mg | ORAL_TABLET | Freq: Four times a day (QID) | ORAL | Status: DC | PRN

## 2019-03-25 MED ORDER — LIDOCAINE 2% (20 MG/ML) 5 ML SYRINGE
INTRAMUSCULAR | Status: AC
  Filled 2019-03-25: qty 5

## 2019-03-25 MED ORDER — INSULIN ASPART 100 UNIT/ML ~~LOC~~ SOLN
0.0000 [IU] | Freq: Three times a day (TID) | SUBCUTANEOUS | Status: DC
  Administered 2019-03-25: 3 [IU] via SUBCUTANEOUS
  Administered 2019-03-26: 5 [IU] via SUBCUTANEOUS
  Administered 2019-03-26: 3 [IU] via SUBCUTANEOUS
  Administered 2019-03-26: 7 [IU] via SUBCUTANEOUS

## 2019-03-25 SURGICAL SUPPLY — 38 items
BLADE SAW RECIP 87.9 MT (BLADE) ×3 IMPLANT
BLADE SURG 21 STRL SS (BLADE) ×5 IMPLANT
BNDG COHESIVE 6X5 TAN STRL LF (GAUZE/BANDAGES/DRESSINGS) IMPLANT
CANISTER WOUND CARE 500ML ATS (WOUND CARE) ×5 IMPLANT
COVER SURGICAL LIGHT HANDLE (MISCELLANEOUS) ×5 IMPLANT
COVER WAND RF STERILE (DRAPES) IMPLANT
CUFF TOURN SGL QUICK 34 (TOURNIQUET CUFF) ×6
CUFF TRNQT CYL 34X4.125X (TOURNIQUET CUFF) ×1 IMPLANT
DRAPE INCISE IOBAN 66X45 STRL (DRAPES) ×5 IMPLANT
DRAPE U-SHAPE 47X51 STRL (DRAPES) ×3 IMPLANT
DRESSING PREVENA PLUS CUSTOM (GAUZE/BANDAGES/DRESSINGS) ×1 IMPLANT
DRSG PREVENA PLUS CUSTOM (GAUZE/BANDAGES/DRESSINGS) ×6
DURAPREP 26ML APPLICATOR (WOUND CARE) ×5 IMPLANT
ELECT REM PT RETURN 9FT ADLT (ELECTROSURGICAL) ×3
ELECTRODE REM PT RTRN 9FT ADLT (ELECTROSURGICAL) ×1 IMPLANT
GLOVE BIOGEL PI IND STRL 9 (GLOVE) ×1 IMPLANT
GLOVE BIOGEL PI INDICATOR 9 (GLOVE) ×2
GLOVE SURG ORTHO 9.0 STRL STRW (GLOVE) ×3 IMPLANT
GOWN STRL REUS W/ TWL XL LVL3 (GOWN DISPOSABLE) ×2 IMPLANT
GOWN STRL REUS W/TWL XL LVL3 (GOWN DISPOSABLE) ×6
KIT BASIN OR (CUSTOM PROCEDURE TRAY) ×3 IMPLANT
KIT TURNOVER KIT B (KITS) ×3 IMPLANT
MANIFOLD NEPTUNE II (INSTRUMENTS) ×3 IMPLANT
NS IRRIG 1000ML POUR BTL (IV SOLUTION) ×3 IMPLANT
PACK ORTHO EXTREMITY (CUSTOM PROCEDURE TRAY) ×3 IMPLANT
PAD ARMBOARD 7.5X6 YLW CONV (MISCELLANEOUS) ×3 IMPLANT
PREVENA RESTOR ARTHOFORM 46X30 (CANNISTER) ×5 IMPLANT
SPONGE LAP 18X18 RF (DISPOSABLE) IMPLANT
STAPLER VISISTAT 35W (STAPLE) ×4 IMPLANT
STOCKINETTE IMPERVIOUS LG (DRAPES) ×5 IMPLANT
SUT ETHILON 2 0 PSLX (SUTURE) IMPLANT
SUT SILK 2 0 (SUTURE) ×3
SUT SILK 2-0 18XBRD TIE 12 (SUTURE) ×1 IMPLANT
SUT VIC AB 1 CTX 27 (SUTURE) ×12 IMPLANT
TOWEL GREEN STERILE (TOWEL DISPOSABLE) ×3 IMPLANT
TUBE CONNECTING 12'X1/4 (SUCTIONS) ×1
TUBE CONNECTING 12X1/4 (SUCTIONS) ×2 IMPLANT
YANKAUER SUCT BULB TIP NO VENT (SUCTIONS) ×3 IMPLANT

## 2019-03-25 NOTE — Progress Notes (Signed)
Inpatient Rehabilitation Admissions Coordinator  Inpatient rehab consult received. I will follow up with patient tomorrow to discuss rehab options. Please call me with any questions.  Danne Baxter, RN, MSN Rehab Admissions Coordinator [phone] 03/25/2019 6:53 PM

## 2019-03-25 NOTE — Anesthesia Procedure Notes (Signed)
Anesthesia Regional Block: Adductor canal block   Pre-Anesthetic Checklist: ,, timeout performed, Correct Patient, Correct Site, Correct Laterality, Correct Procedure, Correct Position, site marked, Risks and benefits discussed,  Surgical consent,  Pre-op evaluation,  At surgeon's request and post-op pain management  Laterality: Left  Prep: chloraprep       Needles:  Injection technique: Single-shot  Needle Type: Echogenic Stimulator Needle     Needle Length: 10cm  Needle Gauge: 21     Additional Needles:   Procedures:,,,, ultrasound used (permanent image in chart),,,,  Narrative:  Start time: 03/25/2019 8:25 AM End time: 03/25/2019 8:31 AM Injection made incrementally with aspirations every 5 mL.  Performed by: Personally  Anesthesiologist: Lidia Collum, MD  Additional Notes: Monitors applied. Injection made in 5cc increments. No resistance to injection. Good needle visualization. Patient tolerated procedure well.

## 2019-03-25 NOTE — Anesthesia Procedure Notes (Signed)
Anesthesia Regional Block: Popliteal block   Pre-Anesthetic Checklist: ,, timeout performed, Correct Patient, Correct Site, Correct Laterality, Correct Procedure, Correct Position, site marked, Risks and benefits discussed,  Surgical consent,  Pre-op evaluation,  At surgeon's request and post-op pain management  Laterality: Left  Prep: chloraprep       Needles:  Injection technique: Single-shot  Needle Type: Echogenic Stimulator Needle     Needle Length: 10cm  Needle Gauge: 21     Additional Needles:   Procedures:,,,, ultrasound used (permanent image in chart),,,,  Narrative:  Start time: 03/25/2019 8:31 AM End time: 03/25/2019 8:36 AM Injection made incrementally with aspirations every 5 mL.  Performed by: Personally  Anesthesiologist: Lidia Collum, MD  Additional Notes: Monitors applied. Injection made in 5cc increments. No resistance to injection. Good needle visualization. Patient tolerated procedure well.

## 2019-03-25 NOTE — Progress Notes (Signed)
[facility] Progress Note   Dialysis Orders: Center:[facility] CCPD 7 Days weekly 80 kg 4 exchanges 1.6 mls Dwell time 1.5 hr, No pause or day dwells. Uses mostly 2.5% solution  BMD meds: -Renvela 800 mg 3 tabs PO TID with meals -Calcitriol 0.5 mcg PO TIW (MWF) - dose lowered due to   Assessment/Plan: 1.  Gangrene/ bilateral Osteo feet with sever PVD- has agreed to bilateral  BKA planned for tomorrow at 10:15 - will need to be to preop by 9 . On Vanc and Zosyn.  Because he is on PD and does not have another dialysis access, it would be ideal for him to go to in house CIR post surgery as SNFs do not accommodate peritoneal dialysis otherwise we will have to place a [facility] so he can do outpatient HD. He is agreeable and would prefer this as well. 2. DM/Hyperglycemia: Per primary - sugars 200s 3. ESRD - Continue CCPD per home orders. K+4.2 . Have been using  2.5% solution all exchanges. d/c'd lasix - little UOP on Na bicarb - bicarb up to 20- labs drawn part way through CCPD treatment- net UF not yet recorded 1.26 L 3/16 am 4. Hypertension/volume - BP ok. No antihypertensive meds noted on OP med list. Amlodipine started 3/13 Continue PD with same orders.Wt lower here than OP clinic. 5. Anemia - HGB initially 7.7 fell  to 5.4 3/12 and transfused two units PRBCs hgb now 9.9  Heme neg 3/11  Had Mircera 225 mcg and Venofer 200 mg IV 03/18/2019. 3/12 tsat 23% ferritin 1688 CRP 15.9   Expect some post surgical drop. 6. Metabolic bone disease -Continue binders/ VDRA. P improving - changed to renal carb mod to help limit P 7.  Nutrition - Albumin low add prostat, renal vits.   Myriam Jacobson, PA-C [place] [phone] 03/25/2019,11:17 AM  LOS: 6 days   Subjective:   S/p bilateral BKAs - doing well - no pain - talking on the phone. No problems with CCPD last night. Objective Vitals:   03/25/19 1020 03/25/19 1025 03/25/19 1035 03/25/19 1057   BP: (!) 86/54 (!) 99/55 99/65 111/63  Pulse: 67 68 67 68  Resp: 20 16 12 18   Temp:   98 F (36.7 C) (!) 97.4 F (36.3 C)  TempSrc:    Oral  SpO2: 93% 96% 93% 98%  Weight:      Height:       Physical Exam General: pleasant slender male  NAD Heart: RRR Lungs: no rales Abdomen: soft NT Extremities:  Bilateral BKA with VACs Dialysis Access: PD cath-RLQ only access   Additional Objective  Intake/Output Summary (Last 24 hours) at 03/25/2019 1117 Last data filed at 03/25/2019 1035 Gross per 24 hour  Intake 360 ml  Output 204 ml  Net 156 ml   Labs: Basic Metabolic Panel: Recent Labs  Lab 03/22/19 0356 03/22/19 0356 03/23/19 0123 03/24/19 0402 03/25/19 0201  NA 135   < > 133* 135 133*  K 4.2   < > 4.3 3.8 4.2  CL 97*   < > 98 94* 93*  CO2 19*   < > 18* 21* 20*  GLUCOSE 258*   < > 205* 232* 280*  BUN 81*   < > 84* 76* 77*  CREATININE 12.87*   < > 12.95* 12.40* 12.58*  CALCIUM 8.5*   < > 8.2* 8.5* 8.5*  PHOS 6.5*  --  6.5* 6.4*  --    < > =  values in this interval not displayed.   Liver Function Tests: Recent Labs  Lab 03/19/19 1541 03/20/19 1538 03/22/19 0356 03/23/19 0123 03/24/19 0402  AST 10*  --   --   --   --   ALT 10  --   --   --   --   ALKPHOS 69  --   --   --   --   BILITOT 0.7  --   --   --   --   PROT 7.5  --   --   --   --   ALBUMIN 2.1*   < > 1.7* 1.6* 1.6*   < > = values in this interval not displayed.   No results for input(s): LIPASE, AMYLASE in the last 168 hours. CBC: Recent Labs  Lab 03/19/19 1541 03/19/19 1541 03/20/19 0038 03/20/19 1541 03/21/19 0914 03/21/19 0914 03/22/19 0356 03/22/19 0356 03/23/19 0123 03/24/19 0402 03/25/19 0201  WBC 16.2*   < > 15.5*  --  13.6*   < > 14.0*   < > 15.1* 13.3* 12.0*  NEUTROABS 10.5*  --  12.0*  --   --   --   --   --   --   --  8.2*  HGB 7.7*   < > 5.4*   < > 9.3*   < > 8.9*   < > 9.0* 9.2* 9.8*  HCT 25.8*   < > 17.1*   < > 29.0*   < > 27.8*   < > 27.5* 29.0* 30.5*  MCV 99.6   < > 94.5   --  93.2  --  93.0  --  92.9 94.5 95.0  PLT 292   < > 279  --  240   < > 265   < > 264 274 286   < > = values in this interval not displayed.   Blood Culture    Component Value Date/Time   SDES BLOOD SITE NOT SPECIFIED 03/19/2019 2016   SPECREQUEST  03/19/2019 2016    BOTTLES DRAWN AEROBIC AND ANAEROBIC Blood Culture adequate volume   CULT  03/19/2019 2016    NO GROWTH 5 DAYS Performed at [facility], [place] [address], [place], [place] [address]    REPTSTATUS 03/24/2019 FINAL 03/19/2019 2016    Cardiac Enzymes: No results for input(s): CKTOTAL, CKMB, CKMBINDEX, TROPONINI in the last 168 hours. CBG: Recent Labs  Lab 03/25/19 0019 03/25/19 0439 03/25/19 0925 03/25/19 0950 03/25/19 1057  GLUCAP 255* 277* 205* 185* 203*   Iron Studies: No results for input(s): IRON, TIBC, TRANSFERRIN, FERRITIN in the last 72 hours. No results found for: INR, PROTIME Studies/Results: No results found. Medications: . sodium chloride    . albumin human    . dialysis solution 2.5% low-MG/low-CA    . piperacillin-tazobactam (ZOSYN)  IV 2.25 g (03/25/19 0001)   . amLODipine  10 mg Oral Daily  . atorvastatin  20 mg Oral q1800  . calcitRIOL  0.5 mcg Oral Once per day on Mon Wed Fri  . cloNIDine  0.1 mg Oral QHS  . feeding supplement (PRO-STAT SUGAR FREE 64)  30 mL Oral TID BM  . gentamicin cream  1 application Topical Daily  . insulin aspart  0-6 Units Subcutaneous TID WC & HS  . insulin glargine  22 Units Subcutaneous Daily  . multivitamin  1 tablet Oral QHS  . sevelamer carbonate  2,400 mg Oral TID WC  . sodium bicarbonate  1,300 mg Oral TID  .  sodium chloride flush  3 mL Intravenous Q12H  . sodium chloride flush  3 mL Intravenous Q12H  . vancomycin variable dose per unstable renal function (pharmacist dosing)   Does not apply See admin instructions

## 2019-03-25 NOTE — Plan of Care (Signed)
  Problem: Safety: Goal: Ability to remain free from injury will improve Outcome: Progressing   

## 2019-03-25 NOTE — Op Note (Signed)
   Date of Surgery: 03/25/2019  INDICATIONS: [name] is a 49 y.o.-year-old male who presents with gangrene bilateral lower extremities.  Ankle-brachial indices shows insufficient circulation for attempted foot salvage patient is end-stage renal disease on peritoneal dialysis and after failure of conservative wound care patient presents at this time for bilateral transtibial amputations.Marland Kitchen  PREOPERATIVE DIAGNOSIS: Gangrene with osteomyelitis bilateral feet  POSTOPERATIVE DIAGNOSIS: Same.  PROCEDURE: Transtibial amputation bilateral Application of Prevena wound VAC bilateral  SURGEON: Sharol Given, M.D.  ANESTHESIA:  general plus bilateral regional blocks  IV FLUIDS AND URINE: See anesthesia.  ESTIMATED BLOOD LOSS: 200 mL.  COMPLICATIONS: None.  DESCRIPTION OF PROCEDURE: The patient was brought to the operating room and underwent a general anesthetic. After adequate levels of anesthesia were obtained patient's bilateral lower extremities were prepped using DuraPrep draped into a sterile field. A timeout was called. The feet were draped out of the sterile field with impervious stockinette. A transverse incision was made 11 cm distal to the tibial tubercle starting on the left leg. This curved proximally and a large posterior flap was created. The tibia was transected 1 cm proximal to the skin incision. The fibula was transected just proximal to the tibial incision. The tibia was beveled anteriorly. A large posterior flap was created. The sciatic nerve was pulled cut and allowed to retract. The vascular bundles were suture ligated with 2-0 silk. The deep and superficial fascial layers were closed using #1 Vicryl. The skin was closed using staples and 2-0 nylon. The wound was covered with a Prevena wound VAC. There was a good suction fit. A prosthetic shrinker will be applied.   A transverse incision was made 11 cm distal to the tibial tubercle  on the right leg. This curved proximally and a large  posterior flap was created. The tibia was transected 1 cm proximal to the skin incision. The fibula was transected just proximal to the tibial incision. The tibia was beveled anteriorly. A large posterior flap was created. The sciatic nerve was pulled cut and allowed to retract. The vascular bundles were suture ligated with 2-0 silk. The deep and superficial fascial layers were closed using #1 Vicryl. The skin was closed using staples and 2-0 nylon. The wound was covered with a Prevena wound VAC   Patient was extubated taken to the PACU in stable condition.   DISCHARGE PLANNING:  Antibiotic duration: Continue IV antibiotics for 24 hours postoperatively  Weightbearing: Nonweightbearing bilateral lower extremities  Pain medication: Opioid pathway ordered  Dressing care/ Wound VAC: Continue wound VAC for 1 week of discharge with the Anderson wound VAC  Discharge to: Anticipate discharge to skilled nursing.  Follow-up: In the office 1 week post operative.  Meridee Score, MD [place] 9:51 AM

## 2019-03-25 NOTE — Anesthesia Procedure Notes (Signed)
Anesthesia Regional Block: Popliteal block   Pre-Anesthetic Checklist: ,, timeout performed, Correct Patient, Correct Site, Correct Laterality, Correct Procedure, Correct Position, site marked, Risks and benefits discussed,  Surgical consent,  Pre-op evaluation,  At surgeon's request and post-op pain management  Laterality: Right  Prep: chloraprep       Needles:  Injection technique: Single-shot  Needle Type: Echogenic Stimulator Needle     Needle Length: 10cm  Needle Gauge: 21     Additional Needles:   Procedures:,,,, ultrasound used (permanent image in chart),,,,  Narrative:  Start time: 03/25/2019 8:40 AM End time: 03/25/2019 8:42 AM Injection made incrementally with aspirations every 5 mL.  Performed by: Personally  Anesthesiologist: Lidia Collum, MD  Additional Notes: Monitors applied. Injection made in 5cc increments. No resistance to injection. Good needle visualization. Patient tolerated procedure well.

## 2019-03-25 NOTE — Transfer of Care (Signed)
Immediate Anesthesia Transfer of Care Note  Patient: Jorge [name]  Procedure(s) Performed: BILATERAL BELOW KNEE AMPUTATION (Bilateral Knee)  Patient Location: PACU  Anesthesia Type:GA combined with regional for post-op pain  Level of Consciousness: drowsy and patient cooperative  Airway & Oxygen Therapy: Patient Spontanous Breathing  Post-op Assessment: Report given to RN, Post -op Vital signs reviewed and stable, Post -op Vital signs reviewed and unstable, Anesthesiologist notified and BP 80s/40s; PACU RN informed MDA and PACU RN will give Albumin  Post vital signs: Reviewed and stable  Last Vitals:  Vitals Value Taken Time  BP 86/46 03/25/19 0951  Temp 36.4 C 03/25/19 0950  Pulse 72 03/25/19 0953  Resp 10 03/25/19 0953  SpO2 96 % 03/25/19 0953  Vitals shown include unvalidated device data.  Last Pain:  Vitals:   03/25/19 0950  TempSrc:   PainSc: (P) Asleep         Complications: No apparent anesthesia complications

## 2019-03-25 NOTE — Anesthesia Procedure Notes (Signed)
Procedure Name: Intubation Date/Time: 03/25/2019 8:53 AM Performed by: Raenette Rover, CRNA Pre-anesthesia Checklist: Patient identified, Emergency Drugs available, Suction available and Patient being monitored Patient Re-evaluated:Patient Re-evaluated prior to induction Oxygen Delivery Method: Circle system utilized Preoxygenation: Pre-oxygenation with 100% oxygen Induction Type: IV induction and Rapid sequence Ventilation: Mask ventilation without difficulty Laryngoscope Size: Mac and 3 Grade View: Grade I Tube type: Oral Tube size: 7.5 mm Number of attempts: 1 Airway Equipment and Method: Stylet Placement Confirmation: ETT inserted through vocal cords under direct vision,  positive ETCO2 and breath sounds checked- equal and bilateral Secured at: 23 cm Tube secured with: Tape Dental Injury: Teeth and Oropharynx as per pre-operative assessment

## 2019-03-25 NOTE — Progress Notes (Signed)
Orthopedic Tech Progress Note Patient Details:  Jorge [name] [birth year] [id number] Called [facility]anger for brace order Patient ID: Jorge [name], male   DOB: 1971-01-01, 49 y.o.   MRN: [id number]   Jorge [name] 03/25/2019, 12:39 PM

## 2019-03-25 NOTE — Anesthesia Postprocedure Evaluation (Signed)
Anesthesia Post Note  Patient: Jorge [name]  Procedure(s) Performed: BILATERAL BELOW KNEE AMPUTATION (Bilateral Knee)     Patient location during evaluation: PACU Anesthesia Type: General Level of consciousness: awake and alert Pain management: pain level controlled Vital Signs Assessment: post-procedure vital signs reviewed and stable Respiratory status: spontaneous breathing, nonlabored ventilation and respiratory function stable Cardiovascular status: blood pressure returned to baseline and stable Postop Assessment: no apparent nausea or vomiting Anesthetic complications: no    Last Vitals:  Vitals:   03/25/19 1025 03/25/19 1035  BP: (!) 99/55 99/65  Pulse: 68 67  Resp: 16 12  Temp:  36.7 C  SpO2: 96% 93%    Last Pain:  Vitals:   03/25/19 1035  TempSrc:   PainSc: 0-No pain      LLE Sensation: Decreased (03/25/19 1035)   RLE Sensation: Decreased (03/25/19 1035)      Lidia Collum

## 2019-03-25 NOTE — Plan of Care (Signed)
  Problem: Education: Goal: Knowledge of General Education information will improve Description: Including pain rating scale, medication(s)/side effects and non-pharmacologic comfort measures Outcome: Progressing   Problem: Clinical Measurements: Goal: Will remain free from infection Outcome: Progressing   Problem: Safety: Goal: Ability to remain free from injury will improve Outcome: Progressing   

## 2019-03-25 NOTE — Interval H&P Note (Signed)
History and Physical Interval Note:  03/25/2019 6:37 AM  Jorge [name]  has presented today for surgery, with the diagnosis of Gangrene Bilateral Feet.  The various methods of treatment have been discussed with the patient and family. After consideration of risks, benefits and other options for treatment, the patient has consented to  Procedure(s): BILATERAL BELOW KNEE AMPUTATION (Bilateral) as a surgical intervention.  The patient's history has been reviewed, patient examined, no change in status, stable for surgery.  I have reviewed the patient's chart and labs.  Questions were answered to the patient's satisfaction.     Newt Minion

## 2019-03-25 NOTE — Progress Notes (Signed)
Pharmacy Antibiotic Note  Jorge [name] is a 49 y.o. male admitted on 03/19/2019 with diabetic wounds with osteo/gangrene.  Pharmacy has been consulted for Vanco/Zosyn dosing.  ID: day # 7 abx for diabetic foot infection, bilateral foot wounds. evidence for osteomyelitis on plain films; for bilat BKAs on 3/17  Afb, WBC 12 down, ESR >140, CRP elevated  3/11: Zosyn 2.25g q8h>> 3/11: Vancomycin 1750 mg, 3/14  Vanc 3/11>> Zosyn 3/11>   3/12: MRSA PCR: negative 3/11 covid: negative 3/11 BCx: negative  3/14 VR = 20 3/17: VR 21  Plan: No further Vancomycin today Another Vanco random level in AM. Redose when <20. Con't Zosyn 2.25g IV q8hr.   Height: 5' 11"  (180.3 cm) Weight: 159 lb 13.3 oz (72.5 kg)(weighed in bed ) IBW/kg (Calculated) : 75.3  Temp (24hrs), Avg:97.8 F (36.6 C), Min:97.4 F (36.3 C), Max:98.3 F (36.8 C)  Recent Labs  Lab 03/19/19 1541 03/19/19 1730 03/20/19 0038 03/21/19 0914 03/22/19 0356 03/23/19 0123 03/24/19 0402 03/25/19 0201  WBC 16.2*  --    < > 13.6* 14.0* 15.1* 13.3* 12.0*  CREATININE 13.93*  --    < > 13.69* 12.87* 12.95* 12.40* 12.58*  LATICACIDVEN 2.4* 1.4  --   --   --   --   --   --   VANCORANDOM  --   --   --   --  20  --   --  21   < > = values in this interval not displayed.    Estimated Creatinine Clearance: 7.2 mL/min (A) (by C-G formula based on SCr of 12.58 mg/dL (H)).    No Known Allergies  Yetta Marceaux S. Alford Highland, PharmD, BCPS Clinical Staff Pharmacist [email or web address] [place] 03/25/2019 12:29 PM

## 2019-03-25 NOTE — Progress Notes (Signed)
PROGRESS NOTE    Jorge [name]  VXY:[id number] DOB: 1970-06-11 DOA: 03/19/2019 PCP: Patient, No Pcp Per    Brief Narrative:  Jorge [name] is a 49 y.o. male with medical history significant for ESRD on peritoneal dialysis, hypertension, insulin-dependent diabetes mellitus, and anemia, now presenting to emergency department for evaluation of swelling, drainage, and following up from his right foot.  Patient reports that he has had an ulcer at the plantar aspect of his left foot for at least a couple months and developed ulceration involving the right foot approximately 2 weeks ago.  The right foot has developed some swelling with foul odor and drainage over the past week or so.  He denies any fevers or chills, denies pain, denies any shortness of breath, cough, or sore throat.  Reports that he recently went to the area, is established with a nephrologist, but does not have a PCP yet.  Upon arrival to the ED, patient is found to be afebrile, saturating well on room air, and stable pressure.  Chemistry panel notable for glucose of 321, BUN 78, bicarbonate 13, anion gap 20, and albumin 2.1.  CBC features a leukocytosis to 16,200 and normocytic anemia: 7.7, down from 10.3 last month.  Lactic acid was elevated to 2.4 initially.  Fecal occult blood testing was negative.  Blood cultures were collected in the emergency department, broad-spectrum antibiotics were started, and orthopedic surgeon and nephrologist were consulted by the ED physician.  COVID-19 screening test has not yet resulted.   Assessment & Plan:   Principal Problem:   Diabetic foot infection ([place]) Active Problems:   ESRD (end stage renal disease) ([place])   Hypertension   Insulin-requiring or dependent type II diabetes mellitus ([place])   Normocytic anemia   ESRD on peritoneal dialysis ([place])   Osteomyelitis of right foot ([place])   Gangrene of right foot ([name])   Gangrene of left foot ([name])   Malnutrition of moderate degree   Sepsis  secondary to osteomyelitisGangrene bilateral feet, POA Patient presenting with ulcerations to bilateral feet with malodorous discharge.  X-ray left foot with soft tissue defect adjacent plantar calcaneus consistent with ulceration.  Right foot x-ray with soft tissue gas projected over the 1/2 metatarsophalangeal joints suspicion for gangrene, erosions/ bony destruction 2nd proximal phalanx and likely great toe proximal phalanx concerning for osteomyelitis.  CRP elevated 10.1 and ESR greater than 140. --Orthopedics following, appreciate assistance --status post bilateral BKA by Dr. Sharol Given 03/25/2019 --continue wound VAC;  Maintain 1 week following discharge per ortho --nonweightbearing bilateral lower extremities --vancomycin/Zosyn, will continue 24 hours postoperatively --pending PT/OT evaluation, likely will need CIR given patient on peritoneal --Norco prn for pain control dialysis and SNFs do not accommodate PD  ESRD on peritoneal dialysis --nephrology following; appreciate assistance  Anemia of chronic Disease Iron level 38, TIBC 162, ferritin 1,688, and folate 34.9; consistent with anemia of chronic disease. --Hgb 7.7-->5.4-->9.1.Marland KitchenMarland Kitchen9.8 today --s/p transfusion 3u pRBC on 03/20/2019 --continue to monitor hemoglobin daily  Essential hypertension BP 136/62 this am --amlodipine 10 mg p.o. daily --clonidine 0.9m po qHS  HLD:  Continue atorvastatin 20 mg p.o. daily  Insulin-dependent diabetes mellitus Hemoglobin A1c 7.8, not well controlled.  Home regimen includes Lantus 10 units subcutaneously daily, Novolin insulin sliding scale. --Lantus 22 units subcutaneously daily --increase insulin sliding scale to sensitive for further coverage --continue monitor CBG's qAC/HS   DVT prophylaxis:  SCDs Code Status:  Full code Family Communication:  Discussed with patient extensively at bedside Disposition Plan:  Patient is from: home     Anticipated Disposition:  CIR given need for  peritoneal dialysis     Barriers to discharge or conditions that needs to be met prior to discharge: 24 hour postoperative antibiotics, pending PT / OT evaluation, pending inpatient rehab evaluation  Consultants:    Orthopedics, Dr. Sharol Given  Procedures:   BKA, bilateral - Dr Sharol Given 3/17  Antimicrobials:    vancomycin   Zosyn   Subjective: Patient seen examined bedside, resting comfortably.  Awaiting transfer to the operating room for planned bilateral BKA today.  Pain controlled.  No specific complaints this morning.  Denies headache, no fever/ chills/ night sweats, no nausea/vomiting / diarrhea, no chest pain, palpitations, no shortness of breath, no abdominal pain.  No acute concerns overnight per nursing staff.  Objective: Vitals:   03/25/19 1025 03/25/19 1035 03/25/19 1057 03/25/19 1331  BP: (!) 99/55 99/65 111/63 100/60  Pulse: 68 67 68 63  Resp: _0 Temp:  98 F (36.7 C) (!) 97.4 F (36.3 C) 97.9 F (36.6 C)  TempSrc:   Oral Oral  SpO2: 96% 93% 98% 100%  Weight:      Height:        Intake/Output Summary (Last 24 hours) at 03/25/2019 1504 Last data filed at 03/25/2019 1300 Gross per 24 hour  Intake 600 ml  Output 204 ml  Net 396 ml   Filed Weights   03/22/19 2104 03/23/19 0803 03/23/19 1826  Weight: 71.4 kg 70.9 kg 72.5 kg    Examination:  General exam: Appears calm and comfortable  Respiratory system: Clear to auscultation. Respiratory effort normal. Cardiovascular system: S1 & S2 heard, RRR. No JVD, murmurs, rubs, gallops or clicks. No pedal edema. Gastrointestinal system: Abdomen is nondistended, soft and nontender. No organomegaly or masses felt. Normal bowel sounds heard. Central nervous system: Alert and oriented. No focal neurological deficits. Extremities: Symmetric 5 x 5 power. Skin: No rashes, lesions or ulcers Psychiatry: Judgement and insight appear normal. Mood & affect appropriate.     Data Reviewed: I have personally reviewed  following labs and imaging studies  CBC: Recent Labs  Lab 03/19/19 1541 03/19/19 1541 03/20/19 0038 03/20/19 1541 03/21/19 0914 03/22/19 0356 03/23/19 0123 03/24/19 0402 03/25/19 0201  WBC 16.2*   < > 15.5*  --  13.6* 14.0* 15.1* 13.3* 12.0*  NEUTROABS 10.5*  --  12.0*  --   --   --   --   --  8.2*  HGB 7.7*   < > 5.4*   < > 9.3* 8.9* 9.0* 9.2* 9.8*  HCT 25.8*   < > 17.1*   < > 29.0* 27.8* 27.5* 29.0* 30.5*  MCV 99.6   < > 94.5  --  93.2 93.0 92.9 94.5 95.0  PLT 292   < > 279  --  240 265 264 274 286   < > = values in this interval not displayed.   Basic Metabolic Panel: Recent Labs  Lab 03/20/19 1538 03/20/19 1538 03/21/19 0914 03/22/19 0356 03/23/19 0123 03/24/19 0402 03/25/19 0201  NA 132*   < > 132* 135 133* 135 133*  K 4.8   < > 4.3 4.2 4.3 3.8 4.2  CL 97*   < > 97* 97* 98 94* 93*  CO2 14*   < > 15* 19* 18* 21* 20*  GLUCOSE 216*   < > 248* 258* 205* 232* 280*  BUN 88*   < > 80* 81* 84* 76* 77*  CREATININE  14.95*   < > 13.69* 12.87* 12.95* 12.40* 12.58*  CALCIUM 8.5*   < > 8.5* 8.5* 8.2* 8.5* 8.5*  PHOS 8.4*  --  7.4* 6.5* 6.5* 6.4*  --    < > = values in this interval not displayed.   GFR: Estimated Creatinine Clearance: 7.2 mL/min (A) (by C-G formula based on SCr of 12.58 mg/dL (H)). Liver Function Tests: Recent Labs  Lab 03/19/19 1541 03/19/19 1541 03/20/19 1538 03/21/19 0914 03/22/19 0356 03/23/19 0123 03/24/19 0402  AST 10*  --   --   --   --   --   --   ALT 10  --   --   --   --   --   --   ALKPHOS 69  --   --   --   --   --   --   BILITOT 0.7  --   --   --   --   --   --   PROT 7.5  --   --   --   --   --   --   ALBUMIN 2.1*   < > 2.0* 1.8* 1.7* 1.6* 1.6*   < > = values in this interval not displayed.   No results for input(s): LIPASE, AMYLASE in the last 168 hours. No results for input(s): AMMONIA in the last 168 hours. Coagulation Profile: No results for input(s): INR, PROTIME in the last 168 hours. Cardiac Enzymes: No results for  input(s): CKTOTAL, CKMB, CKMBINDEX, TROPONINI in the last 168 hours. BNP (last 3 results) No results for input(s): PROBNP in the last 8760 hours. HbA1C: No results for input(s): HGBA1C in the last 72 hours. CBG: Recent Labs  Lab 03/25/19 0439 03/25/19 0925 03/25/19 0950 03/25/19 1057 03/25/19 1158  GLUCAP 277* 205* 185* 203* 261*   Lipid Profile: No results for input(s): CHOL, HDL, LDLCALC, TRIG, CHOLHDL, LDLDIRECT in the last 72 hours. Thyroid Function Tests: No results for input(s): TSH, T4TOTAL, FREET4, T3FREE, THYROIDAB in the last 72 hours. Anemia Panel: No results for input(s): VITAMINB12, FOLATE, FERRITIN, TIBC, IRON, RETICCTPCT in the last 72 hours. Sepsis Labs: Recent Labs  Lab 03/19/19 1541 03/19/19 1730  LATICACIDVEN 2.4* 1.4    Recent Results (from the past 240 hour(s))  Culture, blood (routine x 2)     Status: None   Collection Time: 03/19/19  7:37 PM   Specimen: BLOOD LEFT HAND  Result Value Ref Range Status   Specimen Description BLOOD LEFT HAND  Final   Special Requests   Final    BOTTLES DRAWN AEROBIC AND ANAEROBIC Blood Culture results may not be optimal due to an inadequate volume of blood received in culture bottles   Culture   Final    NO GROWTH 5 DAYS Performed at [facility], [place] [address] [place], [place] [address]    Report Status 03/24/2019 FINAL  Final  Culture, blood (routine x 2)     Status: None   Collection Time: 03/19/19  8:16 PM   Specimen: BLOOD  Result Value Ref Range Status   Specimen Description BLOOD SITE NOT SPECIFIED  Final   Special Requests   Final    BOTTLES DRAWN AEROBIC AND ANAEROBIC Blood Culture adequate volume   Culture   Final    NO GROWTH 5 DAYS Performed at [facility], [place] [address], [place], [place] [address]    Report Status 03/24/2019 FINAL  Final  SARS CORONAVIRUS 2 (TAT 6-24 HRS)  Nasopharyngeal Nasopharyngeal Swab     Status: None   Collection Time: 03/19/19  8:59 PM   Specimen:  Nasopharyngeal Swab  Result Value Ref Range Status   SARS Coronavirus 2 NEGATIVE NEGATIVE Final    Comment: (NOTE) SARS-CoV-2 target nucleic acids are NOT DETECTED. The SARS-CoV-2 RNA is generally detectable in upper and lower respiratory specimens during the acute phase of infection. Negative results do not preclude SARS-CoV-2 infection, do not rule out co-infections with other pathogens, and should not be used as the sole basis for treatment or other patient management decisions. Negative results must be combined with clinical observations, patient history, and epidemiological information. The expected result is Negative. Fact Sheet for Patients: SugarRoll.be Fact Sheet for Healthcare Providers: https://www.woods-mathews.com/ This test is not yet approved or cleared by the [place] FDA and  has been authorized for detection and/or diagnosis of SARS-CoV-2 by FDA under an Emergency Use Authorization (EUA). This EUA will remain  in effect (meaning this test can be used) for the duration of the COVID-19 declaration under Section 56 4(b)(1) of the Act, 21 U.S.C. section 360bbb-3(b)(1), unless the authorization is terminated or revoked sooner. Performed at [facility], [place] [address], [place], [place] [address]   Surgical pcr screen     Status: None   Collection Time: 03/20/19  2:56 AM   Specimen: Nasal Mucosa; Nasal Swab  Result Value Ref Range Status   MRSA, PCR NEGATIVE NEGATIVE Final   Staphylococcus aureus NEGATIVE NEGATIVE Final    Comment: (NOTE) The Xpert SA Assay (FDA approved for NASAL specimens in patients 31 years of age and older), is one component of a comprehensive surveillance program. It is not intended to diagnose infection nor to guide or monitor treatment. Performed at [facility], [place] [address], [place], [place] [address]          Radiology Studies: No results found.      Scheduled  Meds: . amLODipine  10 mg Oral Daily  . atorvastatin  20 mg Oral q1800  . calcitRIOL  0.5 mcg Oral Once per day on Mon Wed Fri  . cloNIDine  0.1 mg Oral QHS  . docusate sodium  100 mg Oral BID  . feeding supplement (PRO-STAT SUGAR FREE 64)  30 mL Oral TID BM  . gentamicin cream  1 application Topical Daily  . insulin aspart  0-6 Units Subcutaneous TID WC & HS  . insulin glargine  22 Units Subcutaneous Daily  . multivitamin  1 tablet Oral QHS  . sevelamer carbonate  2,400 mg Oral TID WC  . sodium bicarbonate  1,300 mg Oral TID  . sodium chloride flush  3 mL Intravenous Q12H  . sodium chloride flush  3 mL Intravenous Q12H  . vancomycin variable dose per unstable renal function (pharmacist dosing)   Does not apply See admin instructions   Continuous Infusions: . sodium chloride    . sodium chloride    . albumin human    . dialysis solution 2.5% low-MG/low-CA    . methocarbamol (ROBAXIN) IV    . piperacillin-tazobactam (ZOSYN)  IV 2.25 g (03/25/19 1223)     LOS: 6 days    Time spent: 35 minutes spent on chart review, discussion with nursing staff, consultants, updating family and interview/physical exam; more than 50% of that time was spent in counseling and/or coordination of care.    Linh Hedberg J [place] DO [facility] Available via Epic secure chat 7am-7pm After these hours, please refer to  coverage provider listed on [email or web address] 03/25/2019, 3:04 PM

## 2019-03-25 NOTE — Anesthesia Procedure Notes (Signed)
Anesthesia Regional Block: Adductor canal block   Pre-Anesthetic Checklist: ,, timeout performed, Correct Patient, Correct Site, Correct Laterality, Correct Procedure, Correct Position, site marked, Risks and benefits discussed,  Surgical consent,  Pre-op evaluation,  At surgeon's request and post-op pain management  Laterality: Right  Prep: chloraprep       Needles:  Injection technique: Single-shot  Needle Type: Echogenic Stimulator Needle     Needle Length: 10cm  Needle Gauge: 21     Additional Needles:   Procedures:,,,, ultrasound used (permanent image in chart),,,,  Narrative:  Start time: 03/25/2019 8:36 AM End time: 03/25/2019 8:40 AM Injection made incrementally with aspirations every 5 mL.  Performed by: Personally  Anesthesiologist: Lidia Collum, MD  Additional Notes: Monitors applied. Injection made in 5cc increments. No resistance to injection. Good needle visualization. Patient tolerated procedure well.

## 2019-03-25 NOTE — Evaluation (Signed)
Physical Therapy Evaluation Patient Details Name: Jorge [name] MRN: [id number] DOB: [birth year] Today's Date: 03/25/2019   History of Present Illness  Pt is a 49 y/o male now s/p bilateral transtibial amputations secondary to gangrene. PMH including but not limited to DM, HTN and ESRD on PD.    Clinical Impression  Pt presented supine in bed with HOB elevated, awake and willing to participate in therapy session. Prior to admission, pt reported that he was independent with all functional mobility and ADLs. Pt lives with his sister and daughter in a multi-level home in which he resides in the basement. Pt stating that there is a "side door" entrance that has a level entry way into his room. However, his full bathroom is on the second level of the house. Pt stated that he will have 24/7 supervision/assistance upon d/c. At the time of evaluation, pt overall limited secondary to weakness and fatigue. He was able to perform bed mobility with min guard and attempted an A-P transfer in bed with min-mod A of two. He is very motivated to make progress and eventually procure LE prostheses. PT emphasized the importance of maintaining knee ROM, particularly knee extension. Pt participated in LE strengthening therex bilaterally. Pt is an excellent candidate for further intensive therapy services at CIR to maximize his independence with functional mobility prior to returning home with family support. PT will continue to follow pt acutely to progress mobility as tolerated.     Follow Up Recommendations CIR    Equipment Recommendations  Wheelchair (measurements PT);Wheelchair cushion (measurements PT);Other (comment)(drop arm BSC; w/c with amputee pad attachments)    Recommendations for Other Services Rehab consult     Precautions / Restrictions Precautions Precautions: Fall Precaution Comments: wound VACs bilateral residual limbs Restrictions Weight Bearing Restrictions: Yes RLE Weight Bearing: Non weight  bearing LLE Weight Bearing: Non weight bearing      Mobility  Bed Mobility Overal bed mobility: Needs Assistance Bed Mobility: Supine to Sit;Sit to Supine     Supine to sit: Min guard Sit to supine: Min guard   General bed mobility comments: pt able to achieve upright long sitting in bed using bilateral UEs on bed rails  Transfers Overall transfer level: Needs assistance   Transfers: Comptroller transfers: Min assist;Mod assist;+2 physical assistance   General transfer comment: performed posterior scoot in bed, initially with no physical assistance; however, pt unable to successfully scoot any distance. With min-mod A x2 pt able to scoot posteriorly towards HOB 2-3 times and then fatigued  Ambulation/Gait                Stairs            Wheelchair Mobility    Modified Rankin (Stroke Patients Only)       Balance Overall balance assessment: Needs assistance Sitting-balance support: No upper extremity supported Sitting balance-Leahy Scale: Fair                                       Pertinent Vitals/Pain Pain Assessment: No/denies pain    Home Living Family/patient expects to be discharged to:: Private residence Living Arrangements: Children;Other relatives Available Help at Discharge: Family;Available 24 hours/day Type of Home: House Home Access: Level entry     Home Layout: Two level;Other (Comment)(his bed/bathroom downstairs in basement) Home Equipment: None      Prior Function Level  of Independence: Independent               Hand Dominance        Extremity/Trunk Assessment   Upper Extremity Assessment Upper Extremity Assessment: Overall WFL for tasks assessed    Lower Extremity Assessment Lower Extremity Assessment: Generalized weakness;LLE deficits/detail;RLE deficits/detail RLE Deficits / Details: pt with decreased strength and ROM limitations secondary to post-op  pain and weakness LLE Deficits / Details: pt with decreased strength and ROM limitations secondary to post-op pain and weakness       Communication   Communication: No difficulties  Cognition Arousal/Alertness: Awake/alert Behavior During Therapy: WFL for tasks assessed/performed Overall Cognitive Status: Within Functional Limits for tasks assessed                                        General Comments      Exercises Amputee Exercises Quad Sets: AROM;Strengthening;Both;10 reps;Supine Gluteal Sets: AROM;Strengthening;Both;10 reps;Supine Hip ABduction/ADduction: AROM;Strengthening;Both;10 reps;Supine Knee Extension: AROM;Strengthening;Both;10 reps;Supine   Assessment/Plan    PT Assessment Patient needs continued PT services  PT Problem List Decreased strength;Decreased range of motion;Decreased activity tolerance;Decreased balance;Decreased mobility;Decreased coordination;Decreased knowledge of use of DME;Decreased safety awareness;Decreased knowledge of precautions;Pain       PT Treatment Interventions DME instruction;Functional mobility training;Therapeutic activities;Therapeutic exercise;Neuromuscular re-education;Balance training;Patient/family education;Wheelchair mobility training    PT Goals (Current goals can be found in the Care Plan section)  Acute Rehab PT Goals Patient Stated Goal: to get prostheses PT Goal Formulation: With patient Time For Goal Achievement: 04/08/19 Potential to Achieve Goals: Good    Frequency Min 5X/week   Barriers to discharge        Co-evaluation               AM-PAC PT "6 Clicks" Mobility  Outcome Measure Help needed turning from your back to your side while in a flat bed without using bedrails?: None Help needed moving from lying on your back to sitting on the side of a flat bed without using bedrails?: None Help needed moving to and from a bed to a chair (including a wheelchair)?: A Lot Help needed standing  up from a chair using your arms (e.g., wheelchair or bedside chair)?: Total Help needed to walk in hospital room?: Total Help needed climbing 3-5 steps with a railing? : Total 6 Click Score: 13    End of Session   Activity Tolerance: Patient limited by fatigue Patient left: in bed;with call bell/phone within reach Nurse Communication: Mobility status PT Visit Diagnosis: Other abnormalities of gait and mobility (R26.89)    Time: 4332-9518 PT Time Calculation (min) (ACUTE ONLY): 18 min   Charges:   PT Evaluation $PT Eval Moderate Complexity: 1 Mod          Eduard Clos, PT, DPT  Acute Rehabilitation Services Pager [phone] Office [place] 03/25/2019, 3:58 PM

## 2019-03-26 DIAGNOSIS — E119 Type 2 diabetes mellitus without complications: Secondary | ICD-10-CM

## 2019-03-26 DIAGNOSIS — Z794 Long term (current) use of insulin: Secondary | ICD-10-CM

## 2019-03-26 DIAGNOSIS — I96 Gangrene, not elsewhere classified: Secondary | ICD-10-CM

## 2019-03-26 DIAGNOSIS — D649 Anemia, unspecified: Secondary | ICD-10-CM

## 2019-03-26 LAB — GLUCOSE, CAPILLARY
Glucose-Capillary: 347 mg/dL — ABNORMAL HIGH (ref 70–99)
Glucose-Capillary: 282 mg/dL — ABNORMAL HIGH (ref 70–99)
Glucose-Capillary: 234 mg/dL — ABNORMAL HIGH (ref 70–99)
Glucose-Capillary: 141 mg/dL — ABNORMAL HIGH (ref 70–99)

## 2019-03-26 LAB — RENAL FUNCTION PANEL
Albumin: 1.7 g/dL — ABNORMAL LOW (ref 3.5–5.0)
Anion gap: 18 — ABNORMAL HIGH (ref 5–15)
BUN: 74 mg/dL — ABNORMAL HIGH (ref 6–20)
CO2: 21 mmol/L — ABNORMAL LOW (ref 22–32)
Calcium: 7.9 mg/dL — ABNORMAL LOW (ref 8.9–10.3)
Chloride: 96 mmol/L — ABNORMAL LOW (ref 98–111)
Creatinine, Ser: 12.43 mg/dL — ABNORMAL HIGH (ref 0.61–1.24)
GFR calc Af Amer: 5 mL/min — ABNORMAL LOW (ref >60)
GFR calc non Af Amer: 4 mL/min — ABNORMAL LOW (ref >60)
Glucose, Bld: 332 mg/dL — ABNORMAL HIGH (ref 70–99)
Phosphorus: 7.2 mg/dL — ABNORMAL HIGH (ref 2.5–4.6)
Potassium: 4.2 mmol/L (ref 3.5–5.1)
Sodium: 135 mmol/L (ref 135–145)

## 2019-03-26 MED ORDER — DARBEPOETIN ALFA 200 MCG/0.4ML IJ SOSY: 200.0000 ug | PREFILLED_SYRINGE | INTRAMUSCULAR | Status: DC

## 2019-03-26 MED ORDER — INSULIN ASPART 100 UNIT/ML ~~LOC~~ SOLN
0.0000 [IU] | Freq: Three times a day (TID) | SUBCUTANEOUS | Status: DC
  Administered 2019-03-27 (×2): 3 [IU] via SUBCUTANEOUS
  Administered 2019-03-28: 08:00:00 5 [IU] via SUBCUTANEOUS
  Administered 2019-03-29: 3 [IU] via SUBCUTANEOUS
  Administered 2019-03-29 – 2019-03-30 (×3): 2 [IU] via SUBCUTANEOUS
  Administered 2019-03-31: 3 [IU] via SUBCUTANEOUS

## 2019-03-26 NOTE — Progress Notes (Signed)
Physical Therapy Treatment Patient Details Name: Jorge [name] MRN: [id number] DOB: 1970/12/12 Today's Date: 03/26/2019    History of Present Illness Pt is a 49 y/o male now s/p bilateral transtibial amputations secondary to gangrene. PMH including but not limited to DM, HTN and ESRD on PD.    PT Comments    Pt making steady progress with functional mobility. He was very eager to work with therapy today and to perform an OOB transfer to recliner chair. He tolerated an A-P scoot transfer with min A x2. He remains an excellent candidate for further intensive therapies at CIR to maximize his independence with functional mobility prior to returning home with family support.     Follow Up Recommendations  CIR     Equipment Recommendations  Wheelchair (measurements PT);Wheelchair cushion (measurements PT);Other (comment)(drop arm BSC; w/c with amputee pad attachments)    Recommendations for Other Services       Precautions / Restrictions Precautions Precautions: Fall Precaution Comments: wound VACs bilateral residual limbs Restrictions Weight Bearing Restrictions: Yes RLE Weight Bearing: Non weight bearing LLE Weight Bearing: Non weight bearing    Mobility  Bed Mobility Overal bed mobility: Needs Assistance Bed Mobility: Supine to Sit     Supine to sit: Min guard     General bed mobility comments: able to long sit in bed and laterally scoot hips towards EOB in preparation for AP transfer  Transfers Overall transfer level: Needs assistance   Transfers: Comptroller transfers: Min assist;+2 physical assistance   General transfer comment: cueing for sequencing and technique, assistance with use of bed pads to fully scoot posteriorly into chair from bed  Ambulation/Gait                 Stairs             Wheelchair Mobility    Modified Rankin (Stroke Patients Only)       Balance Overall balance assessment:  Needs assistance Sitting-balance support: No upper extremity supported Sitting balance-Leahy Scale: Fair                                      Cognition Arousal/Alertness: Awake/alert Behavior During Therapy: WFL for tasks assessed/performed Overall Cognitive Status: Within Functional Limits for tasks assessed                                        Exercises Amputee Exercises Knee Flexion: AROM;Strengthening;Both;10 reps;Seated Knee Extension: AROM;Strengthening;Both;10 reps;Seated Other Exercises Other Exercises: PT demonstrated and instructed pt in desensitization exercise of gentle tactile stimuli for bilateral residual limbs    General Comments        Pertinent Vitals/Pain Pain Assessment: Faces Faces Pain Scale: Hurts little more Pain Location: bilateral residual limbs Pain Descriptors / Indicators: Sore Pain Intervention(s): Monitored during session;Repositioned    Home Living Family/patient expects to be discharged to:: Private residence Living Arrangements: Children;Other relatives Available Help at Discharge: Family;Available 24 hours/day Type of Home: House Home Access: Level entry   Home Layout: Two level;Other (Comment) Home Equipment: Toilet riser      Prior Function Level of Independence: Independent          PT Goals (current goals can now be found in the care plan section) Acute Rehab PT Goals Patient Stated Goal:  bw able to walk again with prosthesis PT Goal Formulation: With patient Time For Goal Achievement: 04/08/19 Potential to Achieve Goals: Good Progress towards PT goals: Progressing toward goals    Frequency    Min 5X/week      PT Plan Current plan remains appropriate    Co-evaluation   Reason for Co-Treatment: For patient/therapist safety;To address functional/ADL transfers          AM-PAC PT "6 Clicks" Mobility   Outcome Measure  Help needed turning from your back to your side while in a  flat bed without using bedrails?: None Help needed moving from lying on your back to sitting on the side of a flat bed without using bedrails?: None Help needed moving to and from a bed to a chair (including a wheelchair)?: A Lot Help needed standing up from a chair using your arms (e.g., wheelchair or bedside chair)?: Total Help needed to walk in hospital room?: Total Help needed climbing 3-5 steps with a railing? : Total 6 Click Score: 13    End of Session   Activity Tolerance: Patient tolerated treatment well Patient left: in chair;with call bell/phone within reach Nurse Communication: Mobility status PT Visit Diagnosis: Other abnormalities of gait and mobility (R26.89)     Time: 6599-3570 PT Time Calculation (min) (ACUTE ONLY): 18 min  Charges:  $Therapeutic Activity: 8-22 mins                     Anastasio Champion, DPT  Acute Rehabilitation Services Pager [phone] Office [place] 03/26/2019, 2:26 PM

## 2019-03-26 NOTE — Progress Notes (Signed)
PROGRESS NOTE    Jorge [name]  DQQ:[id number] DOB: 26-Nov-1970 DOA: 03/19/2019 PCP: Patient, No Pcp Per    Brief Narrative:  Jorge [name] is a 49 y.o. male with medical history significant for ESRD on peritoneal dialysis, hypertension, insulin-dependent diabetes mellitus, and anemia, now presenting to emergency department for evaluation of swelling, drainage, and following up from his right foot.  Patient reports that he has had an ulcer at the plantar aspect of his left foot for at least a couple months and developed ulceration involving the right foot approximately 2 weeks ago.  The right foot has developed some swelling with foul odor and drainage over the past week or so.  He denies any fevers or chills, denies pain, denies any shortness of breath, cough, or sore throat.  Reports that he recently went to the area, is established with a nephrologist, but does not have a PCP yet.  Upon arrival to the ED, patient is found to be afebrile, saturating well on room air, and stable pressure.  Chemistry panel notable for glucose of 321, BUN 78, bicarbonate 13, anion gap 20, and albumin 2.1.  CBC features a leukocytosis to 16,200 and normocytic anemia: 7.7, down from 10.3 last month.  Lactic acid was elevated to 2.4 initially.  Fecal occult blood testing was negative.  Blood cultures were collected in the emergency department, broad-spectrum antibiotics were started, and orthopedic surgeon and nephrologist were consulted by the ED physician.  COVID-19 screening test has not yet resulted.   Assessment & Plan:   Principal Problem:   Diabetic foot infection ([place]) Active Problems:   ESRD (end stage renal disease) ([place])   Hypertension   Insulin-requiring or dependent type II diabetes mellitus ([place])   Normocytic anemia   ESRD on peritoneal dialysis ([place])   Osteomyelitis of right foot ([place])   Gangrene of right foot ([name])   Gangrene of left foot ([name])   Malnutrition of moderate degree   Sepsis  secondary to osteomyelitisGangrene bilateral feet, POA Patient presenting with ulcerations to bilateral feet with malodorous discharge.  X-ray left foot with soft tissue defect adjacent plantar calcaneus consistent with ulceration.  Right foot x-ray with soft tissue gas projected over the 1/2 metatarsophalangeal joints suspicion for gangrene, erosions/ bony destruction 2nd proximal phalanx and likely great toe proximal phalanx concerning for osteomyelitis.  CRP elevated 10.1 and ESR greater than 140. --Orthopedics following, appreciate assistance --status post bilateral transtibial amputation by Dr. Sharol Given 03/25/2019 --continue wound VAC;  Maintain 1 week following discharge per ortho --nonweightbearing bilateral lower extremities --He was treated with vancomycin and zosyn which have now been discontinued --seen by PT/OT with recommendations for CIR --Norco prn for pain control dialysis and SNFs do not accommodate PD  ESRD on peritoneal dialysis --nephrology following; appreciate assistance  Anemia of chronic Disease/Acute blood loss anemia Iron level 38, TIBC 162, ferritin 1,688, and folate 34.9; consistent with anemia of chronic disease. --Hgb 7.7-->5.4-->9.1.Marland KitchenMarland Kitchen7.4 today --s/p transfusion 3u pRBC on 03/20/2019 --likely had some blood loss with surgery --may need another unit of prbc if hemoglobin continues to trend down <7. Recheck in AM  Essential hypertension BP 162/57 this am --amlodipine 10 mg p.o. daily --clonidine 0.10m po qHS  HLD:  Continue atorvastatin 20 mg p.o. daily  Insulin-dependent diabetes mellitus Hemoglobin A1c 7.8, not well controlled.  Home regimen includes Lantus 10 units subcutaneously daily, Novolin insulin sliding scale. --Lantus 22 units subcutaneously daily --increase insulin sliding scale to moderate for further coverage --continue monitor CBG's qAC/HS  DVT prophylaxis:  SCDs Code Status:  Full code Family Communication:  Updated sister over the  phone Disposition Plan:      Patient is from: home     Anticipated Disposition:  CIR given need for peritoneal dialysis     Barriers to discharge or conditions that needs to be met prior to discharge: patient is awaiting insurance authorization for CIR. He also has significant post operative anemia that may require PRBC transfusion if hgb trends down below 7  Consultants:    Orthopedics, Dr. Sharol Given  Nephrology  Procedures:   Transtibial amputation, bilateral- Dr Sharol Given 3/17  Antimicrobials:    vancomycin   Zosyn   Subjective: Patient seen sitting up in chair. He denies any nausea or vomiting. Has ongoing pain in legs bilaterally and requesting more pain medications  Objective: Vitals:   03/26/19 0809 03/26/19 1100 03/26/19 1421 03/26/19 1431  BP: (!) 162/57 (!) 157/62 (!) 116/37 (!) 110/50  Pulse: 74 77 72   Resp: 17 16 17    Temp: 98.6 F (37 C) 98.4 F (36.9 C) 98.4 F (36.9 C)   TempSrc: Oral Oral Oral   SpO2: 99% 98% 99%   Weight:  71 kg    Height:        Intake/Output Summary (Last 24 hours) at 03/26/2019 1758 Last data filed at 03/26/2019 0600 Gross per 24 hour  Intake 407.53 ml  Output 100 ml  Net 307.53 ml   Filed Weights   03/23/19 0803 03/23/19 1826 03/26/19 1100  Weight: 70.9 kg 72.5 kg 71 kg    Examination:  General exam: Alert, awake, oriented x 3 Respiratory system: Clear to auscultation. Respiratory effort normal. Cardiovascular system:RRR. No murmurs, rubs, gallops. Gastrointestinal system: Abdomen is nondistended, soft and nontender. No organomegaly or masses felt. Normal bowel sounds heard. Central nervous system: Alert and oriented. No focal neurological deficits. Extremities: bilateral lower extremity amputations.  Skin: No rashes, lesions or ulcers Psychiatry: Judgement and insight appear normal. Mood & affect appropriate.     Data Reviewed: I have personally reviewed following labs and imaging studies  CBC: Recent Labs  Lab  03/20/19 0038 03/20/19 1541 03/22/19 0356 03/23/19 0123 03/24/19 0402 03/25/19 0201 03/26/19 0748  WBC 15.5*   < > 14.0* 15.1* 13.3* 12.0* 13.0*  NEUTROABS 12.0*  --   --   --   --  8.2*  --   HGB 5.4*   < > 8.9* 9.0* 9.2* 9.8* 7.4*  HCT 17.1*   < > 27.8* 27.5* 29.0* 30.5* 24.0*  MCV 94.5   < > 93.0 92.9 94.5 95.0 96.8  PLT 279   < > 265 264 274 286 217   < > = values in this interval not displayed.   Basic Metabolic Panel: Recent Labs  Lab 03/21/19 0914 03/21/19 0914 03/22/19 0356 03/23/19 0123 03/24/19 0402 03/25/19 0201 03/26/19 0748  NA 132*   < > 135 133* 135 133* 135  K 4.3   < > 4.2 4.3 3.8 4.2 4.2  CL 97*   < > 97* 98 94* 93* 96*  CO2 15*   < > 19* 18* 21* 20* 21*  GLUCOSE 248*   < > 258* 205* 232* 280* 332*  BUN 80*   < > 81* 84* 76* 77* 74*  CREATININE 13.69*   < > 12.87* 12.95* 12.40* 12.58* 12.43*  CALCIUM 8.5*   < > 8.5* 8.2* 8.5* 8.5* 7.9*  PHOS 7.4*  --  6.5* 6.5* 6.4*  --  7.2*   < > =  values in this interval not displayed.   GFR: Estimated Creatinine Clearance: 7.1 mL/min (A) (by C-G formula based on SCr of 12.43 mg/dL (H)). Liver Function Tests: Recent Labs  Lab 03/21/19 0914 03/22/19 0356 03/23/19 0123 03/24/19 0402 03/26/19 0748  ALBUMIN 1.8* 1.7* 1.6* 1.6* 1.7*   No results for input(s): LIPASE, AMYLASE in the last 168 hours. No results for input(s): AMMONIA in the last 168 hours. Coagulation Profile: No results for input(s): INR, PROTIME in the last 168 hours. Cardiac Enzymes: No results for input(s): CKTOTAL, CKMB, CKMBINDEX, TROPONINI in the last 168 hours. BNP (last 3 results) No results for input(s): PROBNP in the last 8760 hours. HbA1C: No results for input(s): HGBA1C in the last 72 hours. CBG: Recent Labs  Lab 03/25/19 1644 03/25/19 2023 03/26/19 0641 03/26/19 1129 03/26/19 1601  GLUCAP 227* 187* 347* 282* 234*   Lipid Profile: No results for input(s): CHOL, HDL, LDLCALC, TRIG, CHOLHDL, LDLDIRECT in the last 72  hours. Thyroid Function Tests: No results for input(s): TSH, T4TOTAL, FREET4, T3FREE, THYROIDAB in the last 72 hours. Anemia Panel: No results for input(s): VITAMINB12, FOLATE, FERRITIN, TIBC, IRON, RETICCTPCT in the last 72 hours. Sepsis Labs: Recent Labs  Lab 03/19/19 1730  LATICACIDVEN 1.4    Recent Results (from the past 240 hour(s))  Culture, blood (routine x 2)     Status: None   Collection Time: 03/19/19  7:37 PM   Specimen: BLOOD LEFT HAND  Result Value Ref Range Status   Specimen Description BLOOD LEFT HAND  Final   Special Requests   Final    BOTTLES DRAWN AEROBIC AND ANAEROBIC Blood Culture results may not be optimal due to an inadequate volume of blood received in culture bottles   Culture   Final    NO GROWTH 5 DAYS Performed at [facility], [place] [address], [place], [place] [address]    Report Status 03/24/2019 FINAL  Final  Culture, blood (routine x 2)     Status: None   Collection Time: 03/19/19  8:16 PM   Specimen: BLOOD  Result Value Ref Range Status   Specimen Description BLOOD SITE NOT SPECIFIED  Final   Special Requests   Final    BOTTLES DRAWN AEROBIC AND ANAEROBIC Blood Culture adequate volume   Culture   Final    NO GROWTH 5 DAYS Performed at [facility], [place] [address] [place], [place] [address]    Report Status 03/24/2019 FINAL  Final  SARS CORONAVIRUS 2 (TAT 6-24 HRS) Nasopharyngeal Nasopharyngeal Swab     Status: None   Collection Time: 03/19/19  8:59 PM   Specimen: Nasopharyngeal Swab  Result Value Ref Range Status   SARS Coronavirus 2 NEGATIVE NEGATIVE Final    Comment: (NOTE) SARS-CoV-2 target nucleic acids are NOT DETECTED. The SARS-CoV-2 RNA is generally detectable in upper and lower respiratory specimens during the acute phase of infection. Negative results do not preclude SARS-CoV-2 infection, do not rule out co-infections with other pathogens, and should not be used as the sole basis for treatment or other patient  management decisions. Negative results must be combined with clinical observations, patient history, and epidemiological information. The expected result is Negative. Fact Sheet for Patients: SugarRoll.be Fact Sheet for Healthcare Providers: https://www.woods-mathews.com/ This test is not yet approved or cleared by the [place] FDA and  has been authorized for detection and/or diagnosis of SARS-CoV-2 by FDA under an Emergency Use Authorization (EUA). This EUA will remain  in effect (meaning this  test can be used) for the duration of the COVID-19 declaration under Section 56 4(b)(1) of the Act, 21 U.S.C. section 360bbb-3(b)(1), unless the authorization is terminated or revoked sooner. Performed at [facility], [place] [address] [place], [place] [address]   Surgical pcr screen     Status: None   Collection Time: 03/20/19  2:56 AM   Specimen: Nasal Mucosa; Nasal Swab  Result Value Ref Range Status   MRSA, PCR NEGATIVE NEGATIVE Final   Staphylococcus aureus NEGATIVE NEGATIVE Final    Comment: (NOTE) The Xpert SA Assay (FDA approved for NASAL specimens in patients 31 years of age and older), is one component of a comprehensive surveillance program. It is not intended to diagnose infection nor to guide or monitor treatment. Performed at [facility], [place] [address], [place], [place] [address]          Radiology Studies: No results found.      Scheduled Meds: . amLODipine  10 mg Oral Daily  . atorvastatin  20 mg Oral q1800  . calcitRIOL  0.5 mcg Oral Once per day on Mon Wed Fri  . cloNIDine  0.1 mg Oral QHS  . [START ON 04/01/2019] darbepoetin (ARANESP) injection - DIALYSIS  200 mcg Intravenous Q Wed-HD  . docusate sodium  100 mg Oral BID  . feeding supplement (PRO-STAT SUGAR FREE 64)  30 mL Oral TID BM  . gentamicin cream  1 application Topical Daily  . insulin aspart  0-9 Units Subcutaneous TID WC  . insulin  glargine  22 Units Subcutaneous Daily  . multivitamin  1 tablet Oral QHS  . sevelamer carbonate  2,400 mg Oral TID WC  . sodium bicarbonate  1,300 mg Oral TID  . sodium chloride flush  3 mL Intravenous Q12H  . sodium chloride flush  3 mL Intravenous Q12H   Continuous Infusions: . sodium chloride    . sodium chloride Stopped (03/25/19 1745)  . dialysis solution 2.5% low-MG/low-CA    . methocarbamol (ROBAXIN) IV       LOS: 7 days    Time spent: 1mns    JKathie Dike MD [facility] Available via Epic secure chat 7am-7pm After these hours, please refer to coverage provider listed on [email or web address] 03/26/2019, 5:58 PM

## 2019-03-26 NOTE — Progress Notes (Addendum)
Results for MARLAN, STEWARD (MRN [id number]) as of 03/26/2019 10:31  Ref. Range 03/25/2019 10:57 03/25/2019 11:58 03/25/2019 16:44 03/25/2019 20:23 03/26/2019 06:41  Glucose-Capillary Latest Ref Range: 70 - 99 mg/dL 203 (H) 261 (H) 227 (H) 187 (H) 347 (H)  Noted that blood sugars have been greater than 180 mg/dl.  Recommend increasing Novolog correction scale to MODERATE (0-15 units) TID if blood sugars continue to be elevated.   Will continue to monitor blood sugars while in the hospital.  Harvel Ricks RN BSN CDE Diabetes Coordinator Pager: [phone]  8am-5pm

## 2019-03-26 NOTE — Evaluation (Signed)
Occupational Therapy Evaluation Patient Details Name: Jorge [name] MRN: [id number] DOB: [birth year] Today's Date: 03/26/2019    History of Present Illness Pt is a 49 y/o male now s/p bilateral transtibial amputations secondary to gangrene. PMH including but not limited to DM, HTN and ESRD on PD.   Clinical Impression   Pt with decline in function and safety with ADLs and ADL mobility with impaired strength, balance and endurance. Pt lives at home with family and reports that he was independent with ADLs/selfcare, IADLs, home mgt and mobility. Pt states that he does not get in tub shower without someone being with him. Pt currently required set up/sup with grooming/hygiene and UB ADLs seated EOB, mod A with LB ADLs, min A +2 with AP transfers and min A with toileting. Pt would benefit from acute OT services to address impairments to maximize level of function and safety    Follow Up Recommendations  CIR    Equipment Recommendations  Other (comment)(TBD at next venue of care)    Recommendations for Other Services Rehab consult     Precautions / Restrictions Precautions Precautions: Fall Precaution Comments: wound VACs bilateral residual limbs Restrictions Weight Bearing Restrictions: Yes RLE Weight Bearing: Non weight bearing LLE Weight Bearing: Non weight bearing      Mobility Bed Mobility Overal bed mobility: Needs Assistance Bed Mobility: Supine to Sit     Supine to sit: Min guard     General bed mobility comments: able to long sit in bed and rotate hips around in prep for AP transfer  Transfers Overall transfer level: Needs assistance   Transfers: Comptroller transfers: Min assist;+2 physical assistance   General transfer comment: able to rotate hips and posterior scoot to EOB    Balance Overall balance assessment: Needs assistance Sitting-balance support: No upper extremity supported Sitting balance-Leahy Scale:  Fair                                     ADL either performed or assessed with clinical judgement   ADL Overall ADL's : Needs assistance/impaired Eating/Feeding: Independent;Sitting   Grooming: Wash/dry hands;Wash/dry face;Supervision/safety;Set up;Sitting   Upper Body Bathing: Supervision/ safety;Set up;Sitting   Lower Body Bathing: Sitting/lateral leans;Moderate assistance   Upper Body Dressing : Supervision/safety;Set up;Sitting   Lower Body Dressing: Sitting/lateral leans;Moderate assistance   Toilet Transfer: Minimal assistance;+2 for physical assistance;+2 for safety/equipment;Anterior/posterior;Cueing for safety   Toileting- Clothing Manipulation and Hygiene: Minimal assistance;Sitting/lateral lean       Functional mobility during ADLs: Minimal assistance;+2 for physical assistance;Cueing for safety General ADL Comments: educated pt on LB ADL compensatort techniques     Vision Baseline Vision/History: Wears glasses Wears Glasses: Reading only Patient Visual Report: No change from baseline       Perception     Praxis      Pertinent Vitals/Pain Pain Assessment: No/denies pain     Hand Dominance Left   Extremity/Trunk Assessment Upper Extremity Assessment Upper Extremity Assessment: Overall WFL for tasks assessed   Lower Extremity Assessment Lower Extremity Assessment: Defer to PT evaluation       Communication Communication Communication: No difficulties   Cognition Arousal/Alertness: Awake/alert Behavior During Therapy: WFL for tasks assessed/performed Overall Cognitive Status: Within Functional Limits for tasks assessed  General Comments       Exercises     Shoulder Instructions      Home Living Family/patient expects to be discharged to:: Private residence Living Arrangements: Children;Other relatives Available Help at Discharge: Family;Available 24 hours/day Type of  Home: House Home Access: Level entry     Home Layout: Two level;Other (Comment)     Bathroom Shower/Tub: Teacher, early years/pre: Standard     Home Equipment: Toilet riser          Prior Functioning/Environment Level of Independence: Independent                 OT Problem List: Decreased activity tolerance;Impaired balance (sitting and/or standing);Decreased knowledge of use of DME or AE      OT Treatment/Interventions: Self-care/ADL training;DME and/or AE instruction;Therapeutic activities;Balance training;Therapeutic exercise;Patient/family education    OT Goals(Current goals can be found in the care plan section) Acute Rehab OT Goals Patient Stated Goal: bw able to walk again with prosthesis OT Goal Formulation: With patient Time For Goal Achievement: 04/15/19 Potential to Achieve Goals: Good ADL Goals Pt Will Perform Grooming: with set-up;sitting Pt Will Perform Upper Body Bathing: with set-up;sitting Pt Will Perform Lower Body Bathing: with min assist;sitting/lateral leans Pt Will Perform Upper Body Dressing: with set-up Pt Will Transfer to Toilet: with min assist;anterior/posterior transfer;bedside commode  OT Frequency: Min 2X/week   Barriers to D/C:            Co-evaluation PT/OT/SLP Co-Evaluation/Treatment: Yes Reason for Co-Treatment: For patient/therapist safety;To address functional/ADL transfers          AM-PAC OT "6 Clicks" Daily Activity     Outcome Measure Help from another person eating meals?: None Help from another person taking care of personal grooming?: A Little Help from another person toileting, which includes using toliet, bedpan, or urinal?: A Little Help from another person bathing (including washing, rinsing, drying)?: A Lot Help from another person to put on and taking off regular upper body clothing?: A Little Help from another person to put on and taking off regular lower body clothing?: A Lot 6 Click Score:  17   End of Session Nurse Communication: Mobility status  Activity Tolerance: Patient tolerated treatment well Patient left: in chair;with call bell/phone within reach  OT Visit Diagnosis: Other abnormalities of gait and mobility (R26.89);Muscle weakness (generalized) (M62.81)                Time: 9470-9628 OT Time Calculation (min): 18 min Charges:  OT Evaluation $OT Eval Moderate Complexity: 1 Mod    Jorge [name] 03/26/2019, 2:10 PM

## 2019-03-26 NOTE — Plan of Care (Signed)
  Problem: Pain Managment: Goal: General experience of comfort will improve Outcome: Progressing   Problem: Safety: Goal: Ability to remain free from injury will improve Outcome: Progressing   Problem: Skin Integrity: Goal: Risk for impaired skin integrity will decrease Outcome: Progressing   

## 2019-03-26 NOTE — Progress Notes (Signed)
Physical Therapy Progress Note  Clinical Impression: Pt seen for a second session to assist with transfer back to bed. He was able to perform A-P transfer from chair to bed with mod A x2 and use of bed pad to assist with scooting hips anteriorly. Pt demonstrated good upper body strength throughout and tolerated treatment session well despite reporting 8/10 pain. Pt requesting pain meds and RN was notified. Pt would continue to benefit from skilled physical therapy services at this time while admitted and after d/c to address the below listed limitations in order to improve overall safety and independence with functional mobility.    03/26/19 1600  PT Visit Information  Last PT Received On 03/26/19  Assistance Needed +2  History of Present Illness Pt is a 49 y/o male now s/p bilateral transtibial amputations secondary to gangrene. PMH including but not limited to DM, HTN and ESRD on PD.  Precautions  Precautions Fall  Precaution Comments wound VACs bilateral residual limbs  Restrictions  Weight Bearing Restrictions Yes  RLE Weight Bearing NWB  LLE Weight Bearing NWB  Pain Assessment  Pain Assessment 0-10  Pain Score 8  Pain Location bilateral residual limbs  Pain Descriptors / Indicators Sore  Pain Intervention(s) Monitored during session;Repositioned;Patient requesting pain meds-RN notified  Cognition  Arousal/Alertness Awake/alert  Behavior During Therapy WFL for tasks assessed/performed  Overall Cognitive Status Within Functional Limits for tasks assessed  Bed Mobility  Overal bed mobility Needs Assistance  Bed Mobility Sit to Supine  Sit to supine Min guard  General bed mobility comments pt able to manage trunk and upper body to descend into supine from a long sitting position  Transfers  Overall transfer level Needs assistance  Transfers Anterior-Posterior Transfer  Anterior-Posterior transfers +2 physical assistance;Mod assist  General transfer comment pt performed anterior  scoot transfer from recliner chair to bed with cueing for sequencing/technique and mod A x2 with use of bed pad to assist with scooting hips anteriorly   Balance  Overall balance assessment Needs assistance  Sitting-balance support No upper extremity supported  Sitting balance-Leahy Scale Fair  PT - End of Session  Activity Tolerance Patient tolerated treatment well  Patient left in bed;with call bell/phone within reach  Nurse Communication Mobility status   PT - Assessment/Plan  PT Plan Current plan remains appropriate  PT Visit Diagnosis Other abnormalities of gait and mobility (R26.89)  PT Frequency (ACUTE ONLY) Min 5X/week  Follow Up Recommendations CIR  PT equipment Wheelchair (measurements PT);Wheelchair cushion (measurements PT);Other (comment) (drop arm BSC; w/c with amputee pad attachments)  AM-PAC PT "6 Clicks" Mobility Outcome Measure (Version 2)  Help needed turning from your back to your side while in a flat bed without using bedrails? 4  Help needed moving from lying on your back to sitting on the side of a flat bed without using bedrails? 4  Help needed moving to and from a bed to a chair (including a wheelchair)? 2  Help needed standing up from a chair using your arms (e.g., wheelchair or bedside chair)? 1  Help needed to walk in hospital room? 1  Help needed climbing 3-5 steps with a railing?  1  6 Click Score 13  Consider Recommendation of Discharge To: CIR/SNF/LTACH  PT Goal Progression  Progress towards PT goals Progressing toward goals  Acute Rehab PT Goals  PT Goal Formulation With patient  Time For Goal Achievement 04/08/19  Potential to Achieve Goals Good  PT Time Calculation  PT Start Time (ACUTE ONLY) 1529  PT Stop Time (ACUTE ONLY) 1541  PT Time Calculation (min) (ACUTE ONLY) 12 min  PT General Charges  $$ ACUTE PT VISIT 1 Visit  PT Treatments  $Therapeutic Activity 8-22 mins  Anastasio Champion, DPT  Acute Rehabilitation Services Pager  [phone] Office [phone]

## 2019-03-26 NOTE — Progress Notes (Signed)
Inpatient Rehabilitation Admissions Coordinator  Inpatient rehab consult received. I met with patient at bedside to discuss goals and expectations of a possible CIR admit. He is in agreement and is a great candidate. I await OT eval and then will begin insurance Auth for a possible admit penidng their approval.  Danne Baxter, RN, MSN Rehab Admissions Coordinator [phone] 03/26/2019 2:08 PM

## 2019-03-26 NOTE — Progress Notes (Signed)
POD1 B BKA. Patient awake alert, pain controlled  0cc in canisters, limb protectors in place. Plan for CIR

## 2019-03-26 NOTE — Plan of Care (Signed)

## 2019-03-26 NOTE — Progress Notes (Signed)
[facility] Progress Note   Dialysis Orders: Center:[facility] CCPD 7 Days weekly 80 kg 4 exchanges 1.6 mls Dwell time 1.5 hr, No pause or day dwells. Uses mostly 2.5% solution  BMD meds: -Renvela 800 mg 3 tabs PO TID with meals -Calcitriol 0.5 mcg PO TIW (MWF) - dose lowered due to   Assessment/Plan: 1. Gangrene/ bilateral Osteo feet with sever PVD-s/p bilateral BKA 3/17 Planning/hoping for CIR which will be ideal because he is on CCPD and this cannot be accomodated at a SNF 2. DM/Hyperglycemia: Per primary - sugars [id number] today 3. ESRD - Continue CCPD per home orders.  Have been using  2.5% solution all exchanges. d/c'd lasix - little UOP on Na bicarb - bicarb up to 20-Wed - labs today pending 4. Hypertension/volume - BP up some today.. No antihypertensive meds noted on OP med list. Amlodipine started 3/13 Continue PD with same orders.Wt lower here than OP [employer] UF 1.4 3/17 -net UF today pending 5. Anemia - HGB initially 7.7 fell  to 5.4 3/12 and transfused two units PRBCs hgb now 9.9  Heme neg 3/11  Had Mircera 225 mcg and Venofer 200 mg IV 03/18/2019. 3/12 tsat 23% ferritin 1688 CRP 15.9   Expect some post surgical drop.-labs pending 6. Metabolic bone disease -Continue binders/ VDRA. P improving - changed to renal carb mod to help limit P 7.  Nutrition - Albumin low add prostat, renal vits. - currently on reg diet- change back to renal carb mod   Myriam Jacobson, PA-C [place] [phone] 03/26/2019,9:18 AM  LOS: 7 days   Subjective:   S/p bilateral BKAs - doing well -pain controlled.- Labs ordered for later than usual to see dialysis effect still pending  Objective Vitals:   03/25/19 2039 03/26/19 0006 03/26/19 0325 03/26/19 0809  BP: (P) 140/66 (!) 147/72 (!) 153/64 (!) 162/57  Pulse: (P) 74 75 71 74  Resp: (P) 16 17 17 17   Temp: (P) 98.7 F (37.1 C) 98.7 F (37.1 C) 98.6 F (37 C) 98.6 F (37 C)  TempSrc: (P)  Oral Oral Oral Oral  SpO2:  98% 100% 99%  Weight:      Height:       Physical Exam General: pleasant slender male  NAD on room air Heart: RRR Lungs: no rales Abdomen: soft NT Extremities:  Bilateral BKA with VACs in legs in canisters Dialysis Access: PD cath-RLQ only access   Additional Objective  Intake/Output Summary (Last 24 hours) at 03/26/2019 0918 Last data filed at 03/26/2019 0600 Gross per 24 hour  Intake 1247.53 ml  Output 300 ml  Net 947.53 ml   Labs: Basic Metabolic Panel: Recent Labs  Lab 03/22/19 0356 03/22/19 0356 03/23/19 0123 03/24/19 0402 03/25/19 0201  NA 135   < > 133* 135 133*  K 4.2   < > 4.3 3.8 4.2  CL 97*   < > 98 94* 93*  CO2 19*   < > 18* 21* 20*  GLUCOSE 258*   < > 205* 232* 280*  BUN 81*   < > 84* 76* 77*  CREATININE 12.87*   < > 12.95* 12.40* 12.58*  CALCIUM 8.5*   < > 8.2* 8.5* 8.5*  PHOS 6.5*  --  6.5* 6.4*  --    < > = values in this interval not displayed.   Liver Function Tests: Recent Labs  Lab 03/19/19 1541 03/20/19 1538 03/22/19 0356 03/23/19 0123 03/24/19 0402  AST 10*  --   --   --   --  ALT 10  --   --   --   --   ALKPHOS 69  --   --   --   --   BILITOT 0.7  --   --   --   --   PROT 7.5  --   --   --   --   ALBUMIN 2.1*   < > 1.7* 1.6* 1.6*   < > = values in this interval not displayed.   No results for input(s): LIPASE, AMYLASE in the last 168 hours. CBC: Recent Labs  Lab 03/19/19 1541 03/19/19 1541 03/20/19 0038 03/20/19 1541 03/21/19 0914 03/21/19 0914 03/22/19 0356 03/22/19 0356 03/23/19 0123 03/24/19 0402 03/25/19 0201  WBC 16.2*   < > 15.5*  --  13.6*   < > 14.0*   < > 15.1* 13.3* 12.0*  NEUTROABS 10.5*  --  12.0*  --   --   --   --   --   --   --  8.2*  HGB 7.7*   < > 5.4*   < > 9.3*   < > 8.9*   < > 9.0* 9.2* 9.8*  HCT 25.8*   < > 17.1*   < > 29.0*   < > 27.8*   < > 27.5* 29.0* 30.5*  MCV 99.6   < > 94.5  --  93.2  --  93.0  --  92.9 94.5 95.0  PLT 292   < > 279  --  240   < > 265   < > 264  274 286   < > = values in this interval not displayed.   Blood Culture    Component Value Date/Time   SDES BLOOD SITE NOT SPECIFIED 03/19/2019 2016   SPECREQUEST  03/19/2019 2016    BOTTLES DRAWN AEROBIC AND ANAEROBIC Blood Culture adequate volume   CULT  03/19/2019 2016    NO GROWTH 5 DAYS Performed at [facility], [place] [address], [place], [place] [address]    REPTSTATUS 03/24/2019 FINAL 03/19/2019 2016    Cardiac Enzymes: No results for input(s): CKTOTAL, CKMB, CKMBINDEX, TROPONINI in the last 168 hours. CBG: Recent Labs  Lab 03/25/19 1057 03/25/19 1158 03/25/19 1644 03/25/19 2023 03/26/19 0641  GLUCAP 203* 261* 227* 187* 347*   Iron Studies: No results for input(s): IRON, TIBC, TRANSFERRIN, FERRITIN in the last 72 hours. No results found for: INR, PROTIME Studies/Results: No results found. Medications: . sodium chloride    . sodium chloride Stopped (03/25/19 1745)  . dialysis solution 2.5% low-MG/low-CA    . methocarbamol (ROBAXIN) IV    . piperacillin-tazobactam (ZOSYN)  IV 2.25 g (03/26/19 0529)   . amLODipine  10 mg Oral Daily  . atorvastatin  20 mg Oral q1800  . calcitRIOL  0.5 mcg Oral Once per day on Mon Wed Fri  . cloNIDine  0.1 mg Oral QHS  . docusate sodium  100 mg Oral BID  . feeding supplement (PRO-STAT SUGAR FREE 64)  30 mL Oral TID BM  . gentamicin cream  1 application Topical Daily  . insulin aspart  0-9 Units Subcutaneous TID WC  . insulin glargine  22 Units Subcutaneous Daily  . multivitamin  1 tablet Oral QHS  . sevelamer carbonate  2,400 mg Oral TID WC  . sodium bicarbonate  1,300 mg Oral TID  . sodium chloride flush  3 mL Intravenous Q12H  . sodium chloride flush  3 mL Intravenous Q12H  . vancomycin variable dose per  unstable renal function (pharmacist dosing)   Does not apply See admin instructions

## 2019-03-27 LAB — RENAL FUNCTION PANEL
Albumin: 1.6 g/dL — ABNORMAL LOW (ref 3.5–5.0)
Anion gap: 17 — ABNORMAL HIGH (ref 5–15)
BUN: 82 mg/dL — ABNORMAL HIGH (ref 6–20)
CO2: 23 mmol/L (ref 22–32)
Calcium: 8.1 mg/dL — ABNORMAL LOW (ref 8.9–10.3)
Chloride: 94 mmol/L — ABNORMAL LOW (ref 98–111)
Creatinine, Ser: 13 mg/dL — ABNORMAL HIGH (ref 0.61–1.24)
GFR calc Af Amer: 5 mL/min — ABNORMAL LOW (ref >60)
GFR calc non Af Amer: 4 mL/min — ABNORMAL LOW (ref >60)
Glucose, Bld: 186 mg/dL — ABNORMAL HIGH (ref 70–99)
Phosphorus: 8 mg/dL — ABNORMAL HIGH (ref 2.5–4.6)
Potassium: 4.2 mmol/L (ref 3.5–5.1)
Sodium: 134 mmol/L — ABNORMAL LOW (ref 135–145)

## 2019-03-27 LAB — CBC
HCT: 24 % — ABNORMAL LOW (ref 39.0–52.0)
HCT: 23.8 % — ABNORMAL LOW (ref 39.0–52.0)
Hemoglobin: 7.4 g/dL — ABNORMAL LOW (ref 13.0–17.0)
Hemoglobin: 7.6 g/dL — ABNORMAL LOW (ref 13.0–17.0)
MCH: 29.8 pg (ref 26.0–34.0)
MCH: 30.6 pg (ref 26.0–34.0)
MCHC: 30.8 g/dL (ref 30.0–36.0)
MCHC: 31.9 g/dL (ref 30.0–36.0)
MCV: 96.8 fL (ref 80.0–100.0)
MCV: 96 fL (ref 80.0–100.0)
Platelets: 217 10*3/uL (ref 150–400)
Platelets: 221 10*3/uL (ref 150–400)
RBC: 2.48 MIL/uL — ABNORMAL LOW (ref 4.22–5.81)
RBC: 2.48 MIL/uL — ABNORMAL LOW (ref 4.22–5.81)
RDW: 14 % (ref 11.5–15.5)
RDW: 14.1 % (ref 11.5–15.5)
WBC: 13 10*3/uL — ABNORMAL HIGH (ref 4.0–10.5)
WBC: 13 10*3/uL — ABNORMAL HIGH (ref 4.0–10.5)
nRBC: 0 % (ref 0.0–0.2)
nRBC: 0 % (ref 0.0–0.2)

## 2019-03-27 LAB — GLUCOSE, CAPILLARY
Glucose-Capillary: 192 mg/dL — ABNORMAL HIGH (ref 70–99)
Glucose-Capillary: 79 mg/dL (ref 70–99)
Glucose-Capillary: 152 mg/dL — ABNORMAL HIGH (ref 70–99)
Glucose-Capillary: 169 mg/dL — ABNORMAL HIGH (ref 70–99)

## 2019-03-27 LAB — SURGICAL PATHOLOGY

## 2019-03-27 MED ORDER — GENTAMICIN SULFATE 0.1 % EX CREA: 1.0000 "application " | TOPICAL_CREAM | Freq: Every day | CUTANEOUS | Status: DC

## 2019-03-27 MED ORDER — INSULIN ASPART 100 UNIT/ML ~~LOC~~ SOLN
4.0000 [IU] | Freq: Three times a day (TID) | SUBCUTANEOUS | Status: DC
  Administered 2019-03-27 – 2019-03-29 (×6): 4 [IU] via SUBCUTANEOUS

## 2019-03-27 MED ORDER — HEPARIN 1000 UNIT/ML FOR PERITONEAL DIALYSIS: 500.0000 [IU] | INTRAMUSCULAR | Status: DC | PRN

## 2019-03-27 MED ORDER — DELFLEX-LC/2.5% DEXTROSE 394 MOSM/L IP SOLN
INTRAPERITONEAL | Status: DC
  Administered 2019-03-27: 5000 mL via INTRAPERITONEAL

## 2019-03-27 MED ORDER — SEVELAMER CARBONATE 800 MG PO TABS
800.0000 mg | ORAL_TABLET | Freq: Two times a day (BID) | ORAL | Status: DC
  Administered 2019-03-27 – 2019-03-28 (×3): 800 mg via ORAL
  Filled 2019-03-27 (×3): qty 1

## 2019-03-27 MED ORDER — INSULIN GLARGINE 100 UNIT/ML ~~LOC~~ SOLN
28.0000 [IU] | Freq: Every day | SUBCUTANEOUS | Status: DC
  Administered 2019-03-28 – 2019-03-29 (×2): 28 [IU] via SUBCUTANEOUS
  Filled 2019-03-27 (×4): qty 0.28

## 2019-03-27 MED ORDER — GABAPENTIN 100 MG PO CAPS
100.0000 mg | ORAL_CAPSULE | Freq: Three times a day (TID) | ORAL | Status: DC
  Administered 2019-03-27 – 2019-03-31 (×13): 100 mg via ORAL
  Filled 2019-03-27 (×13): qty 1

## 2019-03-27 MED ORDER — HEPARIN 1000 UNIT/ML FOR PERITONEAL DIALYSIS
INTRAPERITONEAL | Status: DC | PRN
  Filled 2019-03-27: qty 5000

## 2019-03-27 NOTE — Progress Notes (Signed)
Patient ID: Jorge [name], male   DOB: 1970-12-26, 48 y.o.   MRN: [id number] Patient is status post bilateral transtibial amputations.  There is no drainage in the wound VAC canisters he has the compression socks and limb protectors in place.  Discussed the importance of working on quad sets daily to ensure full knee extension.  Patient's states that he is having pain and I wrote orders for Neurontin 100 mg 3 times a day to help with his neuropathic pain.  Anticipate possible discharge to inpatient rehabilitation.

## 2019-03-27 NOTE — Plan of Care (Signed)

## 2019-03-27 NOTE — Progress Notes (Signed)
PROGRESS NOTE    Jorge [name]  QAS:[id number] DOB: October 18, 1970 DOA: 03/19/2019 PCP: Patient, No Pcp Per    Brief Narrative:  Jorge [name] is a 49 y.o. male with medical history significant for ESRD on peritoneal dialysis, hypertension, insulin-dependent diabetes mellitus, and anemia, now presenting to emergency department for evaluation of swelling, drainage, and following up from his right foot.  Patient reports that he has had an ulcer at the plantar aspect of his left foot for at least a couple months and developed ulceration involving the right foot approximately 2 weeks ago.  The right foot has developed some swelling with foul odor and drainage over the past week or so.  He denies any fevers or chills, denies pain, denies any shortness of breath, cough, or sore throat.  Reports that he recently went to the area, is established with a nephrologist, but does not have a PCP yet.  Upon arrival to the ED, patient is found to be afebrile, saturating well on room air, and stable pressure.  Chemistry panel notable for glucose of 321, BUN 78, bicarbonate 13, anion gap 20, and albumin 2.1.  CBC features a leukocytosis to 16,200 and normocytic anemia: 7.7, down from 10.3 last month.  Lactic acid was elevated to 2.4 initially.  Fecal occult blood testing was negative.  Blood cultures were collected in the emergency department, broad-spectrum antibiotics were started, and orthopedic surgeon and nephrologist were consulted by the ED physician.  COVID-19 screening test has not yet resulted.   Assessment & Plan:   Principal Problem:   Diabetic foot infection ([place]) Active Problems:   ESRD (end stage renal disease) ([place])   Hypertension   Insulin-requiring or dependent type II diabetes mellitus ([place])   Normocytic anemia   ESRD on peritoneal dialysis ([place])   Osteomyelitis of right foot ([place])   Gangrene of right foot ([name])   Gangrene of left foot ([name])   Malnutrition of moderate degree   Sepsis  secondary to osteomyelitisGangrene bilateral feet, POA Patient presenting with ulcerations to bilateral feet with malodorous discharge.  X-ray left foot with soft tissue defect adjacent plantar calcaneus consistent with ulceration.  Right foot x-ray with soft tissue gas projected over the 1/2 metatarsophalangeal joints suspicion for gangrene, erosions/ bony destruction 2nd proximal phalanx and likely great toe proximal phalanx concerning for osteomyelitis.  CRP elevated 10.1 and ESR greater than 140.  Antibiotics with vancomycin and Zosyn discontinued 24 hours postoperatively. --Orthopedics following, appreciate assistance --status post bilateral BKA by Dr. Sharol Given 03/25/2019 --continue wound VAC;  Maintain 1 week following discharge per ortho --nonweightbearing bilateral lower extremities --pending CIR given patient on peritoneal dialysis --Norco prn for pain control dialysis and SNFs do not accommodate PD  ESRD on peritoneal dialysis --nephrology following; appreciate assistance  Anemia of chronic Disease/acute postoperative blood loss anemia Iron level 38, TIBC 162, ferritin 1,688, and folate 34.9; consistent with anemia of chronic disease. --Hgb 7.7-->5.4-->9.1.Marland KitchenMarland Kitchen7.4-->7.6 today --s/p transfusion 3u pRBC on 03/20/2019 --continue to monitor hemoglobin daily; if continues to trend down may need additional unit PRBC  Essential hypertension BP 123/66 this am --amlodipine 10 mg p.o. daily --clonidine 0.64m po qHS  HLD:  Continue atorvastatin 20 mg p.o. daily  Insulin-dependent diabetes mellitus Hemoglobin A1c 7.8, not well controlled.  Home regimen includes Lantus 10 units subcutaneously daily, Novolin insulin sliding scale. --increase Lantus from 22 to 28 units subcutaneously daily --start novolog 4u TID AC --moderate insulin sliding scale to sensitive for further coverage --continue monitor CBG's qAC/HS  DVT prophylaxis:  SCDs Code Status:  Full code Family Communication:  Discussed  with patient extensively at bedside Disposition Plan:      Patient is from: home     Anticipated Disposition:  CIR given need for peritoneal dialysis     Barriers to discharge or conditions that needs to be met prior to discharge: Awaiting insurance authorization for CIR.  Also significant postoperative anemia that may require further PRBC transfusion if hemoglobin continues to trend down.  Consultants:    Orthopedics, Dr. Sharol Given  Procedures:   BKA, bilateral - Dr Sharol Given 3/17  Antimicrobials:    vancomycin 3/11 - 3/18   Zosyn 3/11 - 3/18   Subjective: Patient seen examined bedside, resting comfortably.  Complaining of pain to his bilateral stumps.  Seen by orthopedics, Dr. Sharol Given this morning and started on gabapentin.  Awaiting authorization for inpatient rehabilitation.  No other complaints at this time. Denies headache, no fever/ chills/ night sweats, no nausea/vomiting / diarrhea, no chest pain, palpitations, no shortness of breath, no abdominal pain.  No acute concerns overnight per nursing staff.  Objective: Vitals:   03/27/19 0529 03/27/19 0651 03/27/19 0703 03/27/19 0727  BP: 128/78 123/66  (!) 144/47  Pulse: 71 66  66  Resp: 14 17  17   Temp: 98.4 F (36.9 C) 97.9 F (36.6 C)  (!) 97.3 F (36.3 C)  TempSrc: Oral Oral  Oral  SpO2: 100% 100%  99%  Weight:   67.9 kg   Height:        Intake/Output Summary (Last 24 hours) at 03/27/2019 1108 Last data filed at 03/27/2019 0700 Gross per 24 hour  Intake 360 ml  Output 201 ml  Net 159 ml   Filed Weights   03/26/19 1100 03/26/19 1835 03/27/19 0703  Weight: 71 kg 71.8 kg 67.9 kg    Examination:  General exam: Appears calm and comfortable  Respiratory system: Clear to auscultation. Respiratory effort normal. Cardiovascular system: S1 & S2 heard, RRR. No JVD, murmurs, rubs, gallops or clicks. No pedal edema. Gastrointestinal system: Abdomen is nondistended, soft and nontender. No organomegaly or masses felt. Normal bowel  sounds heard. Central nervous system: Alert and oriented. No focal neurological deficits. Extremities: Symmetric 5 x 5 power.  Bilateral BKA noted with wound vacs in place Skin: No rashes, lesions or ulcers Psychiatry: Judgement and insight appear normal. Mood & affect appropriate.     Data Reviewed: I have personally reviewed following labs and imaging studies  CBC: Recent Labs  Lab 03/23/19 0123 03/24/19 0402 03/25/19 0201 03/26/19 0748 03/27/19 0812  WBC 15.1* 13.3* 12.0* 13.0* 13.0*  NEUTROABS  --   --  8.2*  --   --   HGB 9.0* 9.2* 9.8* 7.4* 7.6*  HCT 27.5* 29.0* 30.5* 24.0* 23.8*  MCV 92.9 94.5 95.0 96.8 96.0  PLT 264 274 286 217 073   Basic Metabolic Panel: Recent Labs  Lab 03/22/19 0356 03/22/19 0356 03/23/19 0123 03/24/19 0402 03/25/19 0201 03/26/19 0748 03/27/19 0812  NA 135   < > 133* 135 133* 135 134*  K 4.2   < > 4.3 3.8 4.2 4.2 4.2  CL 97*   < > 98 94* 93* 96* 94*  CO2 19*   < > 18* 21* 20* 21* 23  GLUCOSE 258*   < > 205* 232* 280* 332* 186*  BUN 81*   < > 84* 76* 77* 74* 82*  CREATININE 12.87*   < > 12.95* 12.40* 12.58* 12.43* 13.00*  CALCIUM 8.5*   < >  8.2* 8.5* 8.5* 7.9* 8.1*  PHOS 6.5*  --  6.5* 6.4*  --  7.2* 8.0*   < > = values in this interval not displayed.   GFR: Estimated Creatinine Clearance: 6.5 mL/min (A) (by C-G formula based on SCr of 13 mg/dL (H)). Liver Function Tests: Recent Labs  Lab 03/22/19 0356 03/23/19 0123 03/24/19 0402 03/26/19 0748 03/27/19 0812  ALBUMIN 1.7* 1.6* 1.6* 1.7* 1.6*   No results for input(s): LIPASE, AMYLASE in the last 168 hours. No results for input(s): AMMONIA in the last 168 hours. Coagulation Profile: No results for input(s): INR, PROTIME in the last 168 hours. Cardiac Enzymes: No results for input(s): CKTOTAL, CKMB, CKMBINDEX, TROPONINI in the last 168 hours. BNP (last 3 results) No results for input(s): PROBNP in the last 8760 hours. HbA1C: No results for input(s): HGBA1C in the last 72  hours. CBG: Recent Labs  Lab 03/26/19 0641 03/26/19 1129 03/26/19 1601 03/26/19 2021 03/27/19 0648  GLUCAP 347* 282* 234* 141* 192*   Lipid Profile: No results for input(s): CHOL, HDL, LDLCALC, TRIG, CHOLHDL, LDLDIRECT in the last 72 hours. Thyroid Function Tests: No results for input(s): TSH, T4TOTAL, FREET4, T3FREE, THYROIDAB in the last 72 hours. Anemia Panel: No results for input(s): VITAMINB12, FOLATE, FERRITIN, TIBC, IRON, RETICCTPCT in the last 72 hours. Sepsis Labs: No results for input(s): PROCALCITON, LATICACIDVEN in the last 168 hours.  Recent Results (from the past 240 hour(s))  Culture, blood (routine x 2)     Status: None   Collection Time: 03/19/19  7:37 PM   Specimen: BLOOD LEFT HAND  Result Value Ref Range Status   Specimen Description BLOOD LEFT HAND  Final   Special Requests   Final    BOTTLES DRAWN AEROBIC AND ANAEROBIC Blood Culture results may not be optimal due to an inadequate volume of blood received in culture bottles   Culture   Final    NO GROWTH 5 DAYS Performed at [facility], [place] [address], [place], [place] [address]    Report Status 03/24/2019 FINAL  Final  Culture, blood (routine x 2)     Status: None   Collection Time: 03/19/19  8:16 PM   Specimen: BLOOD  Result Value Ref Range Status   Specimen Description BLOOD SITE NOT SPECIFIED  Final   Special Requests   Final    BOTTLES DRAWN AEROBIC AND ANAEROBIC Blood Culture adequate volume   Culture   Final    NO GROWTH 5 DAYS Performed at [facility], [place] [address], [place], [place] [address]    Report Status 03/24/2019 FINAL  Final  SARS CORONAVIRUS 2 (TAT 6-24 HRS) Nasopharyngeal Nasopharyngeal Swab     Status: None   Collection Time: 03/19/19  8:59 PM   Specimen: Nasopharyngeal Swab  Result Value Ref Range Status   SARS Coronavirus 2 NEGATIVE NEGATIVE Final    Comment: (NOTE) SARS-CoV-2 target nucleic acids are NOT DETECTED. The SARS-CoV-2 RNA is generally detectable  in upper and lower respiratory specimens during the acute phase of infection. Negative results do not preclude SARS-CoV-2 infection, do not rule out co-infections with other pathogens, and should not be used as the sole basis for treatment or other patient management decisions. Negative results must be combined with clinical observations, patient history, and epidemiological information. The expected result is Negative. Fact Sheet for Patients: SugarRoll.be Fact Sheet for Healthcare Providers: https://www.woods-mathews.com/ This test is not yet approved or cleared by the [place] and  has been authorized  for detection and/or diagnosis of SARS-CoV-2 by FDA under an Emergency Use Authorization (EUA). This EUA will remain  in effect (meaning this test can be used) for the duration of the COVID-19 declaration under Section 56 4(b)(1) of the Act, 21 U.S.C. section 360bbb-3(b)(1), unless the authorization is terminated or revoked sooner. Performed at [facility], [place] [address] [place], [place] [address]   Surgical pcr screen     Status: None   Collection Time: 03/20/19  2:56 AM   Specimen: Nasal Mucosa; Nasal Swab  Result Value Ref Range Status   MRSA, PCR NEGATIVE NEGATIVE Final   Staphylococcus aureus NEGATIVE NEGATIVE Final    Comment: (NOTE) The Xpert SA Assay (FDA approved for NASAL specimens in patients 82 years of age and older), is one component of a comprehensive surveillance program. It is not intended to diagnose infection nor to guide or monitor treatment. Performed at [facility], [place] [address], [place], [place] [address]          Radiology Studies: No results found.      Scheduled Meds: . amLODipine  10 mg Oral Daily  . atorvastatin  20 mg Oral q1800  . calcitRIOL  0.5 mcg Oral Once per day on Mon Wed Fri  . cloNIDine  0.1 mg Oral QHS  . [START ON 04/01/2019] darbepoetin (ARANESP) injection  - DIALYSIS  200 mcg Intravenous Q Wed-HD  . docusate sodium  100 mg Oral BID  . feeding supplement (PRO-STAT SUGAR FREE 64)  30 mL Oral TID BM  . gabapentin  100 mg Oral TID  . gentamicin cream  1 application Topical Daily  . insulin aspart  0-15 Units Subcutaneous TID WC  . insulin aspart  4 Units Subcutaneous TID WC  . insulin glargine  28 Units Subcutaneous Daily  . multivitamin  1 tablet Oral QHS  . sevelamer carbonate  2,400 mg Oral TID WC  . sodium bicarbonate  1,300 mg Oral TID  . sodium chloride flush  3 mL Intravenous Q12H  . sodium chloride flush  3 mL Intravenous Q12H   Continuous Infusions: . sodium chloride    . sodium chloride Stopped (03/25/19 1745)  . dialysis solution 2.5% low-MG/low-CA    . methocarbamol (ROBAXIN) IV       LOS: 8 days    Time spent: 35 minutes spent on chart review, discussion with nursing staff, consultants, updating family and interview/physical exam; more than 50% of that time was spent in counseling and/or coordination of care.    Analiya Porco J [place] DO [facility] Available via Epic secure chat 7am-7pm After these hours, please refer to coverage provider listed on [email or web address] 03/27/2019, 11:08 AM

## 2019-03-27 NOTE — Progress Notes (Signed)
Occupational Therapy Treatment Patient Details Name: Jorge [name] MRN: [id number] DOB: 1970-10-13 Today's Date: 03/27/2019    History of present illness Pt is a 49 y/o male now s/p bilateral transtibial amputations secondary to gangrene. PMH including but not limited to DM, HTN and ESRD on PD.   OT comments  Pt in recliner upon arrival and states that he doesn't feel too well today but agreeable to participate. Session focused din LB selfcare with compensatory techniques, chair push ups to increase B UE strength for functional mobility and safety. Pt urgently needing to have BM and declined transfer to [facility]. Pt able to lean and lift bottom for bed pan to be placed. Pt's NT notified and pt requested session ended. OT will continue to follow acutely  Follow Up Recommendations  CIR    Equipment Recommendations  Other (comment)(TBD at next venue of care)    Recommendations for Other Services      Precautions / Restrictions Precautions Precautions: Fall Precaution Comments: wound VACs bilateral residual limbs Restrictions Weight Bearing Restrictions: Yes RLE Weight Bearing: Non weight bearing LLE Weight Bearing: Non weight bearing       Mobility Bed Mobility Overal bed mobility: Needs Assistance Bed Mobility: Supine to Sit     Supine to sit: Min guard     General bed mobility comments: pt in recliner upon arrival  Transfers Overall transfer level: Needs assistance   Transfers: Comptroller transfers: +2 physical assistance;Mod assist   General transfer comment: push up through  arms seated in chair for bed pan placement ubder him in recliner, refused transfer to [facility] due to urgency to have a BM    Balance Overall balance assessment: Needs assistance Sitting-balance support: Single extremity supported;Bilateral upper extremity supported Sitting balance-Leahy Scale: Fair Sitting balance - Comments: seated activity in recliner                                    ADL either performed or assessed with clinical judgement   ADL                                               Vision Baseline Vision/History: Wears glasses Wears Glasses: Reading only Patient Visual Report: No change from baseline     Perception     Praxis      Cognition Arousal/Alertness: Awake/alert Behavior During Therapy: WFL for tasks assessed/performed Overall Cognitive Status: Within Functional Limits for tasks assessed                                          Exercises Other Exercises Other Exercises: chair  push ups to strengthen triceps for functional mobility/transitions   Shoulder Instructions       General Comments      Pertinent Vitals/ Pain       Pain Assessment: 0-10 Pain Score: 5  Pain Location: bilateral residual limbs Pain Descriptors / Indicators: Sore Pain Intervention(s): Monitored during session;Repositioned  Home Living  Prior Functioning/Environment              Frequency  Min 2X/week        Progress Toward Goals  OT Goals(current goals can now be found in the care plan section)  Progress towards OT goals: Progressing toward goals  Acute Rehab OT Goals Patient Stated Goal: bw able to walk again with prosthesis  Plan Discharge plan remains appropriate    Co-evaluation                 AM-PAC OT "6 Clicks" Daily Activity     Outcome Measure   Help from another person eating meals?: None Help from another person taking care of personal grooming?: A Little Help from another person toileting, which includes using toliet, bedpan, or urinal?: A Little Help from another person bathing (including washing, rinsing, drying)?: A Lot Help from another person to put on and taking off regular upper body clothing?: A Little Help from another person to put on and taking off regular lower  body clothing?: A Lot 6 Click Score: 17    End of Session    OT Visit Diagnosis: Other abnormalities of gait and mobility (R26.89);Muscle weakness (generalized) (M62.81)   Activity Tolerance Patient limited by fatigue   Patient Left in chair;with call bell/phone within reach   Nurse Communication Mobility status;Other (comment)(let NT know pt on bed pan)        Time: 6553-7482 OT Time Calculation (min): 16 min  Charges: OT General Charges $OT Visit: 1 Visit OT Treatments $Therapeutic Activity: 8-22 mins     Britt Bottom 03/27/2019, 2:50 PM

## 2019-03-27 NOTE — Progress Notes (Addendum)
Physical Therapy Progress Note  Clinical Impression: Pt seen for a second session to assist with transferring off of BSC and into recliner chair. He continues to require mod A x2 physical assistance for transfers. Continue to recommend CIR for further intensive therapy services. Pt would continue to benefit from skilled physical therapy services at this time while admitted and after d/c to address the below listed limitations in order to improve overall safety and independence with functional mobility.    03/27/19 1600  PT Visit Information  Last PT Received On 03/27/19  Assistance Needed +2  History of Present Illness Pt is a 49 y/o male now s/p bilateral transtibial amputations secondary to gangrene. PMH including but not limited to DM, HTN and ESRD on PD.  Precautions  Precautions Fall  Precaution Comments wound VACs bilateral residual limbs  Restrictions  Weight Bearing Restrictions Yes  RLE Weight Bearing NWB  LLE Weight Bearing NWB  Pain Assessment  Pain Assessment 0-10  Pain Score 8  Pain Location bilateral residual limbs  Pain Descriptors / Indicators Sore  Pain Intervention(s) Monitored during session;Repositioned  Cognition  Arousal/Alertness Awake/alert  Behavior During Therapy WFL for tasks assessed/performed  Overall Cognitive Status Within Functional Limits for tasks assessed  Bed Mobility  Overal bed mobility Needs Assistance  Bed Mobility Rolling;Supine to Sit  Rolling Min guard  Supine to sit Min guard  General bed mobility comments pt able to roll supine to prone and back to supine with min guard; pt also able to achieve a long sitting position in bed with min guard  Transfers  Overall transfer level Needs assistance  Transfers Anterior-Posterior Transfer  Anterior-Posterior transfers +2 physical assistance;Mod assist  General transfer comment pt performed anterior scoot from [facility] to bed with mod A x2 and use of gait belt, cueing needed for technique. Pt then  performed posterior scoot from bed to recliner chair with mod A and cueing again. Pt with improving upper body strength, however, fatigues quickly  Balance  Overall balance assessment Needs assistance  Sitting-balance support Single extremity supported;Bilateral upper extremity supported  Sitting balance-Leahy Scale Poor  Exercises  Exercises Amputee;Other exercises  Amputee Exercises  Knee Extension AROM;Strengthening;Both;10 reps;Seated  Knee Flexion AROM;Strengthening;Both;10 reps;Seated  Other Exercises  Other Exercises prone bilateral hip flexor stretch  PT - End of Session  Equipment Utilized During Treatment Gait belt  Activity Tolerance Patient tolerated treatment well  Patient left in chair;with call bell/phone within reach  Nurse Communication Mobility status   PT - Assessment/Plan  PT Plan Current plan remains appropriate  PT Visit Diagnosis Other abnormalities of gait and mobility (R26.89)  PT Frequency (ACUTE ONLY) Min 5X/week  Follow Up Recommendations CIR  PT equipment Wheelchair (measurements PT);Wheelchair cushion (measurements PT);Other (comment) (drop arm BSC; w/c with amputee pad attachments)  AM-PAC PT "6 Clicks" Mobility Outcome Measure (Version 2)  Help needed turning from your back to your side while in a flat bed without using bedrails? 4  Help needed moving from lying on your back to sitting on the side of a flat bed without using bedrails? 4  Help needed moving to and from a bed to a chair (including a wheelchair)? 2  Help needed standing up from a chair using your arms (e.g., wheelchair or bedside chair)? 1  Help needed to walk in hospital room? 1  Help needed climbing 3-5 steps with a railing?  1  6 Click Score 13  Consider Recommendation of Discharge To: CIR/SNF/LTACH  PT Goal Progression  Progress  towards PT goals Progressing toward goals  Acute Rehab PT Goals  PT Goal Formulation With patient  Time For Goal Achievement 04/08/19  Potential to  Achieve Goals Good  PT Time Calculation  PT Start Time (ACUTE ONLY) 1127  PT Stop Time (ACUTE ONLY) 1145  PT Time Calculation (min) (ACUTE ONLY) 18 min  PT General Charges  $$ ACUTE PT VISIT 1 Visit  PT Treatments  $Therapeutic Activity 8-22 mins  Anastasio Champion, DPT  Acute Rehabilitation Services Pager [phone] Office [phone]

## 2019-03-27 NOTE — Progress Notes (Signed)
Jorge [name] Progress Note   Subjective:   Patient seen and examined at bedside.  Denies change in appetite or weight loss prior to amputation.  Tolerating PD well.  Reports he mostly uses 2.5% bags at home.  No SOB, CP, abdominal pain and n/v/d.   Objective Vitals:   03/27/19 0529 03/27/19 0651 03/27/19 0703 03/27/19 0727  BP: 128/78 123/66  (!) 144/47  Pulse: 71 66  66  Resp: 14 17  17   Temp: 98.4 F (36.9 C) 97.9 F (36.6 C)  (!) 97.3 F (36.3 C)  TempSrc: Oral Oral  Oral  SpO2: 100% 100%  99%  Weight:   67.9 kg   Height:       Physical Exam General:NAD, well appearing male, laying inbed Heart:RRR Lungs:CTAB Abdomen:soft, NTND Extremities:b/l BKA, legs in canisters  Dialysis Access: RLQ PD cath    [facility] Weights   03/26/19 1100 03/26/19 1835 03/27/19 0703  Weight: 71 kg 71.8 kg 67.9 kg    Intake/Output Summary (Last 24 hours) at 03/27/2019 1310 Last data filed at 03/27/2019 0700 Gross per 24 hour  Intake 360 ml  Output 201 ml  Net 159 ml    Additional Objective Labs: Basic Metabolic Panel: Recent Labs  Lab 03/24/19 0402 03/24/19 0402 03/25/19 0201 03/26/19 0748 03/27/19 0812  NA 135   < > 133* 135 134*  K 3.8   < > 4.2 4.2 4.2  CL 94*   < > 93* 96* 94*  CO2 21*   < > 20* 21* 23  GLUCOSE 232*   < > 280* 332* 186*  BUN 76*   < > 77* 74* 82*  CREATININE 12.40*   < > 12.58* 12.43* 13.00*  CALCIUM 8.5*   < > 8.5* 7.9* 8.1*  PHOS 6.4*  --   --  7.2* 8.0*   < > = values in this interval not displayed.   Liver Function Tests: Recent Labs  Lab 03/24/19 0402 03/26/19 0748 03/27/19 0812  ALBUMIN 1.6* 1.7* 1.6*   CBC: Recent Labs  Lab 03/23/19 0123 03/23/19 0123 03/24/19 0402 03/24/19 0402 03/25/19 0201 03/26/19 0748 03/27/19 0812  WBC 15.1*   < > 13.3*   < > 12.0* 13.0* 13.0*  NEUTROABS  --   --   --   --  8.2*  --   --   HGB 9.0*   < > 9.2*   < > 9.8* 7.4* 7.6*  HCT 27.5*   < > 29.0*   < > 30.5* 24.0* 23.8*  MCV 92.9  --  94.5   --  95.0 96.8 96.0  PLT 264   < > 274   < > 286 217 221   < > = values in this interval not displayed.    CBG: Recent Labs  Lab 03/26/19 1129 03/26/19 1601 03/26/19 2021 03/27/19 0648 03/27/19 1114  GLUCAP 282* 234* 141* 192* 79    Medications: . sodium chloride    . sodium chloride Stopped (03/25/19 1745)  . dialysis solution 2.5% low-MG/low-CA    . methocarbamol (ROBAXIN) IV     . amLODipine  10 mg Oral Daily  . atorvastatin  20 mg Oral q1800  . calcitRIOL  0.5 mcg Oral Once per day on Mon Wed Fri  . cloNIDine  0.1 mg Oral QHS  . [START ON 04/01/2019] darbepoetin (ARANESP) injection - DIALYSIS  200 mcg Intravenous Q Wed-HD  . docusate sodium  100 mg Oral BID  . feeding supplement (PRO-STAT SUGAR FREE  64)  30 mL Oral TID BM  . gabapentin  100 mg Oral TID  . gentamicin cream  1 application Topical Daily  . insulin aspart  0-15 Units Subcutaneous TID WC  . insulin aspart  4 Units Subcutaneous TID WC  . insulin glargine  28 Units Subcutaneous Daily  . multivitamin  1 tablet Oral QHS  . sevelamer carbonate  2,400 mg Oral TID WC  . sodium bicarbonate  1,300 mg Oral TID  . sodium chloride flush  3 mL Intravenous Q12H  . sodium chloride flush  3 mL Intravenous Q12H    Dialysis Orders: Center:[facility] CCPD 7 Days weekly 80 kg 4 exchanges 1.6 mls Dwell time 1.5 hr, No pause or day dwells. Uses mostly 2.5% solution  BMD meds: -Renvela 800 mg 3 tabs PO TID with meals -Calcitriol 0.5 mcg PO TIW (MWF) - dose lowered due to  Assessment/Plan: 1. Gangrene/ bilateral Osteo feet with sever PVD-s/p bilateral BKA 3/17 Planning/hoping for CIR which will be ideal because he is on CCPD and this cannot be accomodated at a SNF.PT/OT evaluating.  2. DM/Hyperglycemia: Per primary - blood sugars improving.  Per primary. 3. ESRD - Continue CCPD per home orders.  Have been using  2.5% solution all exchanges. d/c lasix d/t little urine output.  On Na bicarb - bicarb improved to 23  today.  4. Hypertension/volume - BP mostly well controlled.  Not antihypertensive meds as OP. Amlodipine started here on 3/13 Continue PD with same orders.Significantly under edw, down to 67.9kg today.  Does not appear volume overload. Will need new edw at d/c.  5. Anemia - HGB initially 7.7 fell  to 5.43/12 and transfused two units PRBCs, up to 9.9 but now down to 7.4 post surgery.  Heme neg 3/11  Had Mircera 225 mcg and Venofer 200 mg IV 03/18/2019. 3/12 tsat 23% ferritin 1688 CRP 15.9. No iron d/t ^ferritin.  Due for ESA next week.  Transfuse as indicated.  6. Metabolic bone disease -  CCa in goal. Phos^8.0. Add binders w/snacks. Continue VDRA.  7.  Nutrition - Albumin low add prostat, renal vits. - Renal/carb modified diet.   Jen Mow, PA-C [name] Pager: [phone] 03/27/2019,1:10 PM  LOS: 8 days

## 2019-03-27 NOTE — Progress Notes (Signed)
Physical Therapy Treatment Patient Details Name: Jorge [name] MRN: [id number] DOB: 25-Jul-1970 Today's Date: 03/27/2019    History of Present Illness Pt is a 49 y/o male now s/p bilateral transtibial amputations secondary to gangrene. PMH including but not limited to DM, HTN and ESRD on PD.    PT Comments    Upon arrival, pt reporting that he felt like he needed to have a BM and requested assistance with transferring to [facility]. He continues to required two person mod A for AP transfer as his upper body fatigues quickly with scooting. He remains an excellent candidate for further intensive therapy services at CIR to maximize his independence with functional mobility prior to returning home with family support.     Follow Up Recommendations  CIR     Equipment Recommendations  Wheelchair (measurements PT);Wheelchair cushion (measurements PT);Other (comment)(drop arm BSC; w/c with amputee pad attachments)    Recommendations for Other Services       Precautions / Restrictions Precautions Precautions: Fall Precaution Comments: wound VACs bilateral residual limbs Restrictions Weight Bearing Restrictions: Yes RLE Weight Bearing: Non weight bearing LLE Weight Bearing: Non weight bearing    Mobility  Bed Mobility Overal bed mobility: Needs Assistance Bed Mobility: Supine to Sit     Supine to sit: Min guard     General bed mobility comments: able to long sit in bed and laterally scoot hips towards EOB in preparation for AP transfer  Transfers Overall transfer level: Needs assistance   Transfers: Anterior-Posterior Transfer       Anterior-Posterior transfers: +2 physical assistance;Mod assist   General transfer comment: pt performed posterior scoot transfer from bed to [facility] with cueing for sequencing/technique and mod A x2 with use of gait belt  Ambulation/Gait                 Stairs             Wheelchair Mobility    Modified Rankin (Stroke Patients  Only)       Balance Overall balance assessment: Needs assistance Sitting-balance support: Single extremity supported;Bilateral upper extremity supported Sitting balance-Leahy Scale: Poor Sitting balance - Comments: pt losing balance posteriorly but able to self correct with UE supports                                    Cognition Arousal/Alertness: Awake/alert Behavior During Therapy: WFL for tasks assessed/performed Overall Cognitive Status: Within Functional Limits for tasks assessed                                        Exercises      General Comments        Pertinent Vitals/Pain Pain Assessment: 0-10 Pain Score: 7  Pain Location: bilateral residual limbs Pain Descriptors / Indicators: Sore Pain Intervention(s): Monitored during session;Repositioned    Home Living                      Prior Function            PT Goals (current goals can now be found in the care plan section) Acute Rehab PT Goals PT Goal Formulation: With patient Time For Goal Achievement: 04/08/19 Potential to Achieve Goals: Good Progress towards PT goals: Progressing toward goals    Frequency    Min 5X/week  PT Plan Current plan remains appropriate    Co-evaluation              AM-PAC PT "6 Clicks" Mobility   Outcome Measure  Help needed turning from your back to your side while in a flat bed without using bedrails?: None Help needed moving from lying on your back to sitting on the side of a flat bed without using bedrails?: None Help needed moving to and from a bed to a chair (including a wheelchair)?: A Lot Help needed standing up from a chair using your arms (e.g., wheelchair or bedside chair)?: Total Help needed to walk in hospital room?: Total Help needed climbing 3-5 steps with a railing? : Total 6 Click Score: 13    End of Session Equipment Utilized During Treatment: Gait belt Activity Tolerance: Patient tolerated  treatment well Patient left: with call bell/phone within reach;Other (comment)(on BSC attempting to have a BM) Nurse Communication: Mobility status PT Visit Diagnosis: Other abnormalities of gait and mobility (R26.89)     Time: 9518-8416 PT Time Calculation (min) (ACUTE ONLY): 12 min  Charges:  $Therapeutic Activity: 8-22 mins                     Anastasio Champion, DPT  Acute Rehabilitation Services Pager [phone] Office [place] 03/27/2019, 12:53 PM

## 2019-03-28 LAB — GLUCOSE, CAPILLARY
Glucose-Capillary: 211 mg/dL — ABNORMAL HIGH (ref 70–99)
Glucose-Capillary: 91 mg/dL (ref 70–99)
Glucose-Capillary: 78 mg/dL (ref 70–99)
Glucose-Capillary: 183 mg/dL — ABNORMAL HIGH (ref 70–99)

## 2019-03-28 LAB — BASIC METABOLIC PANEL
Anion gap: 20 — ABNORMAL HIGH (ref 5–15)
BUN: 88 mg/dL — ABNORMAL HIGH (ref 6–20)
CO2: 23 mmol/L (ref 22–32)
Calcium: 8.3 mg/dL — ABNORMAL LOW (ref 8.9–10.3)
Chloride: 92 mmol/L — ABNORMAL LOW (ref 98–111)
Creatinine, Ser: 13.5 mg/dL — ABNORMAL HIGH (ref 0.61–1.24)
GFR calc Af Amer: 4 mL/min — ABNORMAL LOW (ref >60)
GFR calc non Af Amer: 4 mL/min — ABNORMAL LOW (ref >60)
Glucose, Bld: 244 mg/dL — ABNORMAL HIGH (ref 70–99)
Potassium: 4 mmol/L (ref 3.5–5.1)
Sodium: 135 mmol/L (ref 135–145)

## 2019-03-28 LAB — CBC
HCT: 24.1 % — ABNORMAL LOW (ref 39.0–52.0)
Hemoglobin: 7.5 g/dL — ABNORMAL LOW (ref 13.0–17.0)
MCH: 30 pg (ref 26.0–34.0)
MCHC: 31.1 g/dL (ref 30.0–36.0)
MCV: 96.4 fL (ref 80.0–100.0)
Platelets: 209 10*3/uL (ref 150–400)
RBC: 2.5 MIL/uL — ABNORMAL LOW (ref 4.22–5.81)
RDW: 14.1 % (ref 11.5–15.5)
WBC: 13.4 10*3/uL — ABNORMAL HIGH (ref 4.0–10.5)
nRBC: 0 % (ref 0.0–0.2)

## 2019-03-28 NOTE — Progress Notes (Signed)
Patient ID: Jorge [name], male   DOB: 1970-12-20, 49 y.o.   MRN: [id number] Patient is status post bilateral transtibial amputations he has no complaints this morning he is ready to proceed with inpatient rehabilitation he states he has the desire interest and energy.  Awaiting insurance approval.  No drainage in the wound VAC canisters.

## 2019-03-28 NOTE — Plan of Care (Signed)
  Problem: Pain Managment: Goal: General experience of comfort will improve Outcome: Progressing   Problem: Safety: Goal: Ability to remain free from injury will improve Outcome: Progressing   Problem: Skin Integrity: Goal: Risk for impaired skin integrity will decrease Outcome: Progressing   

## 2019-03-28 NOTE — Progress Notes (Signed)
Physical Therapy Treatment Patient Details Name: Jorge [name] MRN: [id number] DOB: 1970-05-18 Today's Date: 03/28/2019    History of Present Illness Pt is a 49 y/o male now s/p bilateral transtibial amputations secondary to gangrene. PMH including but not limited to DM, HTN and ESRD on PD.    PT Comments    Patient required min a to transfer over to the chair using a drop arm chair. He got stuck scooting about halfway and needed assist. He is otherwise doing well. Therapy reviewed HEP with patient. Therapy will advance as tolerated.   Follow Up Recommendations  CIR     Equipment Recommendations  Wheelchair (measurements PT);Wheelchair cushion (measurements PT);Other (comment)    Recommendations for Other Services Rehab consult     Precautions / Restrictions Restrictions Weight Bearing Restrictions: Yes RLE Weight Bearing: Non weight bearing LLE Weight Bearing: Non weight bearing    Mobility  Bed Mobility Overal bed mobility: Needs Assistance Bed Mobility: Rolling;Supine to Sit Rolling: Min guard   Supine to sit: Min guard Sit to supine: Min guard   General bed mobility comments: supine to sit with gaurd for safety   Transfers Overall transfer level: Needs assistance Equipment used: Rolling walker (2 wheeled) Transfers: Lateral/Scoot Transfers          Lateral/Scoot Transfers: Min assist General transfer comment: Patient got stuck about halfway and needed min a to scoot   Ambulation/Gait                 Stairs             Wheelchair Mobility    Modified Rankin (Stroke Patients Only)       Balance   Sitting-balance support: Single extremity supported Sitting balance-Leahy Scale: Fair Sitting balance - Comments: able to maintain balance                                     Cognition Arousal/Alertness: Awake/alert Behavior During Therapy: WFL for tasks assessed/performed Overall Cognitive Status: Within Functional Limits  for tasks assessed                                        Exercises Other Exercises Other Exercises: reviewed ther-ex to perfrom while sitting in the chair     General Comments        Pertinent Vitals/Pain Pain Assessment: 0-10 Pain Score: 4  Faces Pain Scale: Hurts little more Pain Descriptors / Indicators: Sore Pain Intervention(s): Limited activity within patient's tolerance;Monitored during session;Patient requesting pain meds-RN notified    Home Living                      Prior Function            PT Goals (current goals can now be found in the care plan section) Acute Rehab PT Goals PT Goal Formulation: With patient Time For Goal Achievement: 04/08/19 Potential to Achieve Goals: Good Progress towards PT goals: Progressing toward goals    Frequency    Min 5X/week      PT Plan Current plan remains appropriate    Co-evaluation              AM-PAC PT "6 Clicks" Mobility   Outcome Measure  Help needed turning from your back to your side while in a flat  bed without using bedrails?: None Help needed moving from lying on your back to sitting on the side of a flat bed without using bedrails?: None Help needed moving to and from a bed to a chair (including a wheelchair)?: A Lot Help needed standing up from a chair using your arms (e.g., wheelchair or bedside chair)?: Total Help needed to walk in hospital room?: Total Help needed climbing 3-5 steps with a railing? : Total 6 Click Score: 13    End of Session Equipment Utilized During Treatment: Gait belt Activity Tolerance: Patient tolerated treatment well Patient left: in chair;with call bell/phone within reach Nurse Communication: Mobility status PT Visit Diagnosis: Other abnormalities of gait and mobility (R26.89)     Time: 0102-7253 PT Time Calculation (min) (ACUTE ONLY): 15 min  Charges:  $Therapeutic Activity: 8-22 mins                       Carney Living PT DPT   03/28/2019, 1:59 PM

## 2019-03-28 NOTE — Progress Notes (Signed)
PROGRESS NOTE    Jorge [name]  VCB:[id number] DOB: 16-Jul-1970 DOA: 03/19/2019 PCP: Patient, No Pcp Per    Brief Narrative:  Jorge [name] is a 49 y.o. male with medical history significant for ESRD on peritoneal dialysis, hypertension, insulin-dependent diabetes mellitus, and anemia, now presenting to emergency department for evaluation of swelling, drainage, and following up from his right foot.  Patient reports that he has had an ulcer at the plantar aspect of his left foot for at least a couple months and developed ulceration involving the right foot approximately 2 weeks ago.  The right foot has developed some swelling with foul odor and drainage over the past week or so.  He denies any fevers or chills, denies pain, denies any shortness of breath, cough, or sore throat.  Reports that he recently went to the area, is established with a nephrologist, but does not have a PCP yet.  Upon arrival to the ED, patient is found to be afebrile, saturating well on room air, and stable pressure. Chemistry panel notable for glucose of 321, BUN 78, bicarbonate 13, anion gap 20, and albumin 2.1.  CBC features a leukocytosis to 16,200 and normocytic anemia: 7.7, down from 10.3 last month.  Lactic acid was elevated to 2.4 initially.  Fecal occult blood testing was negative.  Blood cultures were collected in the emergency department, broad-spectrum antibiotics were started, and orthopedic surgeon and nephrologist were consulted by the ED physician.  COVID-19 screening test has not yet resulted.   Assessment & Plan:   Principal Problem:   Diabetic foot infection ([facility]) Active Problems:   ESRD (end stage renal disease) ([place])   Hypertension   Insulin-requiring or dependent type II diabetes mellitus ([place])   Normocytic anemia   ESRD on peritoneal dialysis ([place])   Osteomyelitis of right foot ([place])   Gangrene of right foot ([name])   Gangrene of left foot ([name])   Malnutrition of moderate degree   Sepsis  secondary to osteomyelitisGangrene bilateral feet, POA Patient presenting with ulcerations to bilateral feet with malodorous discharge.  X-ray left foot with soft tissue defect adjacent plantar calcaneus consistent with ulceration.  Right foot x-ray with soft tissue gas projected over the 1/2 metatarsophalangeal joints suspicion for gangrene, erosions/ bony destruction 2nd proximal phalanx and likely great toe proximal phalanx concerning for osteomyelitis.  CRP elevated 10.1 and ESR greater than 140.  Antibiotics with vancomycin and Zosyn discontinued 24 hours postoperatively. --Orthopedics following, appreciate assistance --status post bilateral BKA by Dr. Sharol Given 03/25/2019 --continue wound VAC;  Maintain 1 week following discharge per ortho --nonweightbearing bilateral lower extremities --pending CIR given patient on peritoneal dialysis and SNFs do not accommodate PD --Norco prn for pain control  ESRD on peritoneal dialysis --nephrology following; appreciate assistance  Anemia of chronic Disease/acute postoperative blood loss anemia Iron level 38, TIBC 162, ferritin 1,688, and folate 34.9; consistent with anemia of chronic disease. --Hgb 7.7-->5.4-->9.1.Marland KitchenMarland Kitchen7.4-->7.6-->7.5 today --s/p transfusion 3u pRBC on 03/20/2019  Essential hypertension BP 123/55 this am --amlodipine 10 mg p.o. daily --clonidine 0.19m po qHS  HLD:  Continue atorvastatin 20 mg p.o. daily  Insulin-dependent diabetes mellitus Hemoglobin A1c 7.8, not well controlled.  Home regimen includes Lantus 10 units subcutaneously daily, Novolin insulin sliding scale. --Lantus 28 units subcutaneously daily --Novolog 4u TID AC --moderate insulin sliding scale to sensitive for further coverage --continue monitor CBG's qAC/HS   DVT prophylaxis:  SCDs Code Status:  Full code Family Communication:  Discussed with patient extensively at bedside Disposition Plan:  Patient is from: home     Anticipated Disposition:  CIR given need  for peritoneal dialysis     Barriers to discharge or conditions that needs to be met prior to discharge: Awaiting insurance authorization for CIR.    Consultants:    Orthopedics, Dr. Sharol Given  Procedures:   BKA, bilateral - Dr Sharol Given 3/17  Antimicrobials:    vancomycin 3/11 - 3/18   Zosyn 3/11 - 3/18   Subjective: Patient seen examined bedside, resting comfortably.  Currently undergoing PD.  Pain better controlled with addition of gabapentin yesterday.  No other complaints at this time. Denies headache, no fever/ chills/ night sweats, no nausea/vomiting / diarrhea, no chest pain, palpitations, no shortness of breath, no abdominal pain.  No acute concerns overnight per nursing staff.  Objective: Vitals:   03/28/19 0655 03/28/19 1000 03/28/19 1055 03/28/19 1234  BP:  (!) 132/58 126/60 110/79  Pulse:  78 80 76  Resp:   16 16  Temp:  98.1 F (36.7 C) 98 F (36.7 C) 98.6 F (37 C)  TempSrc:  Oral Oral Oral  SpO2:  98% 98% 99%  Weight: 67.9 kg  67.5 kg   Height:        Intake/Output Summary (Last 24 hours) at 03/28/2019 1323 Last data filed at 03/28/2019 0900 Gross per 24 hour  Intake 240 ml  Output 300 ml  Net -60 ml   Filed Weights   03/27/19 1916 03/28/19 0655 03/28/19 1055  Weight: 68.1 kg 67.9 kg 67.5 kg    Examination:  General exam: Appears calm and comfortable  Respiratory system: Clear to auscultation. Respiratory effort normal. Cardiovascular system: S1 & S2 heard, RRR. No JVD, murmurs, rubs, gallops or clicks. No pedal edema. Gastrointestinal system: Abdomen is nondistended, soft and nontender. No organomegaly or masses felt. Normal bowel sounds heard. Central nervous system: Alert and oriented. No focal neurological deficits. Extremities: Symmetric 5 x 5 power.  Bilateral BKA noted with wound vacs in place Skin: No rashes, lesions or ulcers Psychiatry: Judgement and insight appear normal. Mood & affect appropriate.     Data Reviewed: I have personally  reviewed following labs and imaging studies  CBC: Recent Labs  Lab 03/24/19 0402 03/25/19 0201 03/26/19 0748 03/27/19 0812 03/28/19 0404  WBC 13.3* 12.0* 13.0* 13.0* 13.4*  NEUTROABS  --  8.2*  --   --   --   HGB 9.2* 9.8* 7.4* 7.6* 7.5*  HCT 29.0* 30.5* 24.0* 23.8* 24.1*  MCV 94.5 95.0 96.8 96.0 96.4  PLT 274 286 217 221 622   Basic Metabolic Panel: Recent Labs  Lab 03/22/19 0356 03/22/19 0356 03/23/19 0123 03/23/19 0123 03/24/19 0402 03/25/19 0201 03/26/19 0748 03/27/19 0812 03/28/19 0404  NA 135   < > 133*   < > 135 133* 135 134* 135  K 4.2   < > 4.3   < > 3.8 4.2 4.2 4.2 4.0  CL 97*   < > 98   < > 94* 93* 96* 94* 92*  CO2 19*   < > 18*   < > 21* 20* 21* 23 23  GLUCOSE 258*   < > 205*   < > 232* 280* 332* 186* 244*  BUN 81*   < > 84*   < > 76* 77* 74* 82* 88*  CREATININE 12.87*   < > 12.95*   < > 12.40* 12.58* 12.43* 13.00* 13.50*  CALCIUM 8.5*   < > 8.2*   < > 8.5* 8.5* 7.9* 8.1* 8.3*  PHOS 6.5*  --  6.5*  --  6.4*  --  7.2* 8.0*  --    < > = values in this interval not displayed.   GFR: Estimated Creatinine Clearance: 6.3 mL/min (A) (by C-G formula based on SCr of 13.5 mg/dL (H)). Liver Function Tests: Recent Labs  Lab 03/22/19 0356 03/23/19 0123 03/24/19 0402 03/26/19 0748 03/27/19 0812  ALBUMIN 1.7* 1.6* 1.6* 1.7* 1.6*   No results for input(s): LIPASE, AMYLASE in the last 168 hours. No results for input(s): AMMONIA in the last 168 hours. Coagulation Profile: No results for input(s): INR, PROTIME in the last 168 hours. Cardiac Enzymes: No results for input(s): CKTOTAL, CKMB, CKMBINDEX, TROPONINI in the last 168 hours. BNP (last 3 results) No results for input(s): PROBNP in the last 8760 hours. HbA1C: No results for input(s): HGBA1C in the last 72 hours. CBG: Recent Labs  Lab 03/27/19 1114 03/27/19 1604 03/27/19 2200 03/28/19 0654 03/28/19 1208  GLUCAP 79 152* 169* 211* 91   Lipid Profile: No results for input(s): CHOL, HDL, LDLCALC,  TRIG, CHOLHDL, LDLDIRECT in the last 72 hours. Thyroid Function Tests: No results for input(s): TSH, T4TOTAL, FREET4, T3FREE, THYROIDAB in the last 72 hours. Anemia Panel: No results for input(s): VITAMINB12, FOLATE, FERRITIN, TIBC, IRON, RETICCTPCT in the last 72 hours. Sepsis Labs: No results for input(s): PROCALCITON, LATICACIDVEN in the last 168 hours.  Recent Results (from the past 240 hour(s))  Culture, blood (routine x 2)     Status: None   Collection Time: 03/19/19  7:37 PM   Specimen: BLOOD LEFT HAND  Result Value Ref Range Status   Specimen Description BLOOD LEFT HAND  Final   Special Requests   Final    BOTTLES DRAWN AEROBIC AND ANAEROBIC Blood Culture results may not be optimal due to an inadequate volume of blood received in culture bottles   Culture   Final    NO GROWTH 5 DAYS Performed at [facility], [place] [address] [place], [place] [address]    Report Status 03/24/2019 FINAL  Final  Culture, blood (routine x 2)     Status: None   Collection Time: 03/19/19  8:16 PM   Specimen: BLOOD  Result Value Ref Range Status   Specimen Description BLOOD SITE NOT SPECIFIED  Final   Special Requests   Final    BOTTLES DRAWN AEROBIC AND ANAEROBIC Blood Culture adequate volume   Culture   Final    NO GROWTH 5 DAYS Performed at [facility], [place] [address], [place], [place] [address]    Report Status 03/24/2019 FINAL  Final  SARS CORONAVIRUS 2 (TAT 6-24 HRS) Nasopharyngeal Nasopharyngeal Swab     Status: None   Collection Time: 03/19/19  8:59 PM   Specimen: Nasopharyngeal Swab  Result Value Ref Range Status   SARS Coronavirus 2 NEGATIVE NEGATIVE Final    Comment: (NOTE) SARS-CoV-2 target nucleic acids are NOT DETECTED. The SARS-CoV-2 RNA is generally detectable in upper and lower respiratory specimens during the acute phase of infection. Negative results do not preclude SARS-CoV-2 infection, do not rule out co-infections with other pathogens, and should not be  used as the sole basis for treatment or other patient management decisions. Negative results must be combined with clinical observations, patient history, and epidemiological information. The expected result is Negative. Fact Sheet for Patients: SugarRoll.be Fact Sheet for Healthcare Providers: https://www.woods-mathews.com/ This test is not yet approved or cleared by the [place] and  has been authorized  for detection and/or diagnosis of SARS-CoV-2 by FDA under an Emergency Use Authorization (EUA). This EUA will remain  in effect (meaning this test can be used) for the duration of the COVID-19 declaration under Section 56 4(b)(1) of the Act, 21 U.S.C. section 360bbb-3(b)(1), unless the authorization is terminated or revoked sooner. Performed at [facility], [place] [address] [place], [place] [address]   Surgical pcr screen     Status: None   Collection Time: 03/20/19  2:56 AM   Specimen: Nasal Mucosa; Nasal Swab  Result Value Ref Range Status   MRSA, PCR NEGATIVE NEGATIVE Final   Staphylococcus aureus NEGATIVE NEGATIVE Final    Comment: (NOTE) The Xpert SA Assay (FDA approved for NASAL specimens in patients 28 years of age and older), is one component of a comprehensive surveillance program. It is not intended to diagnose infection nor to guide or monitor treatment. Performed at [facility], [place] [address] [place], [place] [address]          Radiology Studies: No results found.      Scheduled Meds: . amLODipine  10 mg Oral Daily  . atorvastatin  20 mg Oral q1800  . calcitRIOL  0.5 mcg Oral Once per day on Mon Wed Fri  . cloNIDine  0.1 mg Oral QHS  . [START ON 04/01/2019] darbepoetin (ARANESP) injection - DIALYSIS  200 mcg Intravenous Q Wed-HD  . docusate sodium  100 mg Oral BID  . feeding supplement (PRO-STAT SUGAR FREE 64)  30 mL Oral TID BM  . gabapentin  100 mg Oral TID  . gentamicin cream  1  application Topical Daily  . insulin aspart  0-15 Units Subcutaneous TID WC  . insulin aspart  4 Units Subcutaneous TID WC  . insulin glargine  28 Units Subcutaneous Daily  . multivitamin  1 tablet Oral QHS  . sevelamer carbonate  2,400 mg Oral TID WC  . sevelamer carbonate  800 mg Oral BID BM  . sodium bicarbonate  1,300 mg Oral TID  . sodium chloride flush  3 mL Intravenous Q12H  . sodium chloride flush  3 mL Intravenous Q12H   Continuous Infusions: . sodium chloride    . sodium chloride Stopped (03/25/19 1745)  . dialysis solution 2.5% low-MG/low-CA    . methocarbamol (ROBAXIN) IV       LOS: 9 days    Time spent: 35 minutes spent on chart review, discussion with nursing staff, consultants, updating family and interview/physical exam; more than 50% of that time was spent in counseling and/or coordination of care.    Aerion Bagdasarian J [place] DO [facility] Available via Epic secure chat 7am-7pm After these hours, please refer to coverage provider listed on [email or web address] 03/28/2019, 1:23 PM

## 2019-03-28 NOTE — Plan of Care (Signed)
  Problem: Nutrition: Goal: Adequate nutrition will be maintained Outcome: Progressing   Problem: Pain Managment: Goal: General experience of comfort will improve Outcome: Progressing   

## 2019-03-28 NOTE — Progress Notes (Signed)
[facility] Progress Note   Subjective:   Patient seen and examined at bedside.  Reports feeling ok this AM.  Waiting to hear if insurance will cover inpatient rehab.  PD went well overnight.  No abdominal pain, n/v/d, CP, SOB, edema, weakness or fatigue.   Objective Vitals:   03/27/19 1916 03/27/19 2022 03/28/19 0302 03/28/19 0655  BP:  125/87 (!) 123/55   Pulse:  76 70   Resp:  16    Temp:  97.8 F (36.6 C) 98.4 F (36.9 C)   TempSrc:  Oral Oral   SpO2:  100% 99%   Weight: 68.1 kg   67.9 kg  Height:       Physical Exam General:chronically ill appearing male, laying in bed Heart:RRR Lungs:CTAB Abdomen:soft, NDNT, PD cath in RLQ Extremities:b/l BKA in wound vac canisters  Dialysis Access: PD cath in RLQ accessed   [facility] Weights   03/27/19 1903 03/27/19 1916 03/28/19 0655  Weight: 64.4 kg 68.1 kg 67.9 kg    Intake/Output Summary (Last 24 hours) at 03/28/2019 0920 Last data filed at 03/28/2019 0700 Gross per 24 hour  Intake --  Output 300 ml  Net -300 ml    Additional Objective Labs: Basic Metabolic Panel: Recent Labs  Lab 03/24/19 0402 03/25/19 0201 03/26/19 0748 03/27/19 0812 03/28/19 0404  NA 135   < > 135 134* 135  K 3.8   < > 4.2 4.2 4.0  CL 94*   < > 96* 94* 92*  CO2 21*   < > 21* 23 23  GLUCOSE 232*   < > 332* 186* 244*  BUN 76*   < > 74* 82* 88*  CREATININE 12.40*   < > 12.43* 13.00* 13.50*  CALCIUM 8.5*   < > 7.9* 8.1* 8.3*  PHOS 6.4*  --  7.2* 8.0*  --    < > = values in this interval not displayed.   Liver Function Tests: Recent Labs  Lab 03/24/19 0402 03/26/19 0748 03/27/19 0812  ALBUMIN 1.6* 1.7* 1.6*   CBC: Recent Labs  Lab 03/24/19 0402 03/24/19 0402 03/25/19 0201 03/25/19 0201 03/26/19 0748 03/27/19 0812 03/28/19 0404  WBC 13.3*   < > 12.0*   < > 13.0* 13.0* 13.4*  NEUTROABS  --   --  8.2*  --   --   --   --   HGB 9.2*   < > 9.8*   < > 7.4* 7.6* 7.5*  HCT 29.0*   < > 30.5*   < > 24.0* 23.8* 24.1*  MCV 94.5   --  95.0  --  96.8 96.0 96.4  PLT 274   < > 286   < > 217 221 209   < > = values in this interval not displayed.   CBG: Recent Labs  Lab 03/27/19 0648 03/27/19 1114 03/27/19 1604 03/27/19 2200 03/28/19 0654  GLUCAP 192* 79 152* 169* 211*    Medications: . sodium chloride    . sodium chloride Stopped (03/25/19 1745)  . dialysis solution 2.5% low-MG/low-CA    . methocarbamol (ROBAXIN) IV     . amLODipine  10 mg Oral Daily  . atorvastatin  20 mg Oral q1800  . calcitRIOL  0.5 mcg Oral Once per day on Mon Wed Fri  . cloNIDine  0.1 mg Oral QHS  . [START ON 04/01/2019] darbepoetin (ARANESP) injection - DIALYSIS  200 mcg Intravenous Q Wed-HD  . docusate sodium  100 mg Oral BID  . feeding supplement (PRO-STAT  SUGAR FREE 64)  30 mL Oral TID BM  . gabapentin  100 mg Oral TID  . gentamicin cream  1 application Topical Daily  . insulin aspart  0-15 Units Subcutaneous TID WC  . insulin aspart  4 Units Subcutaneous TID WC  . insulin glargine  28 Units Subcutaneous Daily  . multivitamin  1 tablet Oral QHS  . sevelamer carbonate  2,400 mg Oral TID WC  . sevelamer carbonate  800 mg Oral BID BM  . sodium bicarbonate  1,300 mg Oral TID  . sodium chloride flush  3 mL Intravenous Q12H  . sodium chloride flush  3 mL Intravenous Q12H    Dialysis Orders: Center:[facility] CCPD 7 Days weekly 80 kg 4 exchanges 1.6 mls Dwell time 1.5 hr, No pause or day dwells. Uses mostly 2.5% solution  BMD meds: -Renvela 800 mg 3 tabs PO TID with meals -Calcitriol 0.5 mcg PO TIW (MWF) - dose lowered due to  Assessment/Plan: 1. Gangrene/ bilateral Osteo feet with sever PVD-s/p bilateral BKA 3/17Planning/hopingfor CIR which will be ideal because he is on CCPD and this cannot be accomodated at a SNF. PT/OT evaluating. Waiting for insurance approval.  2. DM/Hyperglycemia: Per primary - blood sugars improving.  Per primary. 3. ESRD - Continue CCPD per home orders. Have been using 2.5% solution all  exchanges. d/c lasix d/t little urine output.  On Na bicarb - last bicarb 23. 4. Hypertension/volume - BP mostly well controlled.  Not antihypertensive meds as OP. Amlodipine started here on 3/13 Continue PD with same orders.Significantly under edw, down to 67.9kg on 3/19. Does not appear volume overload. Will need new edw at d/c.  5. Anemia - HGB initially 7.7 fell to 5.43/12 and transfused two units PRBCs, up to 9.9 but now down to 7.5 post surgery. Heme neg 3/11 Had Mircera 225 mcg and Venofer 200 mg IV 03/18/2019. 3/12 tsat 23% ferritin 1688 CRP 15.9. No iron d/t ^ferritin.  Due for ESA next week.  Transfuse as indicated.  6. Metabolic bone disease -  CCa in goal. Last Phos^8.0. Added binders w/snacks. Continue VDRA.  7. Nutrition - Albumin low add prostat, renal vits.- Renal/carb modified diet.    Jen Mow, PA-C [facility] Pager: [phone] 03/28/2019,9:20 AM  LOS: 9 days

## 2019-03-29 LAB — GLUCOSE, CAPILLARY
Glucose-Capillary: 146 mg/dL — ABNORMAL HIGH (ref 70–99)
Glucose-Capillary: 39 mg/dL — CL (ref 70–99)
Glucose-Capillary: 99 mg/dL (ref 70–99)
Glucose-Capillary: 154 mg/dL — ABNORMAL HIGH (ref 70–99)
Glucose-Capillary: 201 mg/dL — ABNORMAL HIGH (ref 70–99)

## 2019-03-29 MED ORDER — DELFLEX-LC/2.5% DEXTROSE 394 MOSM/L IP SOLN: INTRAPERITONEAL | Status: DC

## 2019-03-29 MED ORDER — HEPARIN 1000 UNIT/ML FOR PERITONEAL DIALYSIS
INTRAPERITONEAL | Status: DC | PRN
  Filled 2019-03-29 (×7): qty 5000

## 2019-03-29 MED ORDER — GENTAMICIN SULFATE 0.1 % EX CREA
1.0000 "application " | TOPICAL_CREAM | Freq: Every day | CUTANEOUS | Status: DC
  Administered 2019-03-29: 1 via TOPICAL
  Filled 2019-03-29: qty 15

## 2019-03-29 MED ORDER — HEPARIN 1000 UNIT/ML FOR PERITONEAL DIALYSIS
500.0000 [IU] | INTRAMUSCULAR | Status: DC | PRN
  Filled 2019-03-29: qty 0.5

## 2019-03-29 MED ORDER — SEVELAMER CARBONATE 800 MG PO TABS: 800.0000 mg | ORAL_TABLET | Freq: Two times a day (BID) | ORAL | Status: DC | PRN

## 2019-03-29 NOTE — Progress Notes (Signed)
Jorge [name] Progress Note   Subjective:   Patient seen and examined at bedside.  No new complaints.   Objective Vitals:   03/28/19 1838 03/28/19 2007 03/29/19 0333 03/29/19 0848  BP: 118/60 (!) 106/51 111/61 (!) 110/48  Pulse: 72 73 74 70  Resp: 16   16  Temp: 98.4 F (36.9 C) 98.6 F (37 C) 98.1 F (36.7 C) 98.2 F (36.8 C)  TempSrc: Oral Oral Oral Oral  SpO2:  93% 97% 100%  Weight: 67.8 kg  68.3 kg   Height:       Physical Exam General:Chronically ill appearing, pleasant male, sitting up in bed Lungs:nml WOB Extremities:b/l BKA w/ legs in wound vac canisters  Dialysis Access: PD cath   [facility] Weights   03/28/19 1055 03/28/19 1838 03/29/19 0333  Weight: 67.5 kg 67.8 kg 68.3 kg    Intake/Output Summary (Last 24 hours) at 03/29/2019 1244 Last data filed at 03/29/2019 1000 Gross per 24 hour  Intake 480 ml  Output 6864 ml  Net -6384 ml    Additional Objective Labs: Basic Metabolic Panel: Recent Labs  Lab 03/24/19 0402 03/25/19 0201 03/26/19 0748 03/27/19 0812 03/28/19 0404  NA 135   < > 135 134* 135  K 3.8   < > 4.2 4.2 4.0  CL 94*   < > 96* 94* 92*  CO2 21*   < > 21* 23 23  GLUCOSE 232*   < > 332* 186* 244*  BUN 76*   < > 74* 82* 88*  CREATININE 12.40*   < > 12.43* 13.00* 13.50*  CALCIUM 8.5*   < > 7.9* 8.1* 8.3*  PHOS 6.4*  --  7.2* 8.0*  --    < > = values in this interval not displayed.   CBC: Recent Labs  Lab 03/24/19 0402 03/24/19 0402 03/25/19 0201 03/25/19 0201 03/26/19 0748 03/27/19 0812 03/28/19 0404  WBC 13.3*   < > 12.0*   < > 13.0* 13.0* 13.4*  NEUTROABS  --   --  8.2*  --   --   --   --   HGB 9.2*   < > 9.8*   < > 7.4* 7.6* 7.5*  HCT 29.0*   < > 30.5*   < > 24.0* 23.8* 24.1*  MCV 94.5  --  95.0  --  96.8 96.0 96.4  PLT 274   < > 286   < > 217 221 209   < > = values in this interval not displayed.    Medications: . sodium chloride    . sodium chloride Stopped (03/25/19 1745)  . dialysis solution 2.5%  low-MG/low-CA    . methocarbamol (ROBAXIN) IV     . amLODipine  10 mg Oral Daily  . atorvastatin  20 mg Oral q1800  . calcitRIOL  0.5 mcg Oral Once per day on Mon Wed Fri  . cloNIDine  0.1 mg Oral QHS  . [START ON 04/01/2019] darbepoetin (ARANESP) injection - DIALYSIS  200 mcg Intravenous Q Wed-HD  . docusate sodium  100 mg Oral BID  . feeding supplement (PRO-STAT SUGAR FREE 64)  30 mL Oral TID BM  . gabapentin  100 mg Oral TID  . gentamicin cream  1 application Topical Daily  . insulin aspart  0-15 Units Subcutaneous TID WC  . insulin glargine  28 Units Subcutaneous Daily  . multivitamin  1 tablet Oral QHS  . sevelamer carbonate  2,400 mg Oral TID WC  . sodium bicarbonate  1,300  mg Oral TID  . sodium chloride flush  3 mL Intravenous Q12H  . sodium chloride flush  3 mL Intravenous Q12H    Dialysis Orders: [facility] CCPD 7 Days weekly 80 kg 4 exchanges 1.6 mls Dwell time 1.5 hr, No pause or day dwells. Uses mostly 2.5% solution  BMD meds: -Renvela 800 mg 3 tabs PO TID with meals -Calcitriol 0.5 mcg PO TIW (MWF) - dose lowered due to  Assessment/Plan: 1. Gangrene/ bilateral Osteo feet with sever PVD-s/p bilateral BKA 3/17Planning/hopingfor CIR which will be ideal because he is on CCPD and this cannot be accomodated at a SNF. PT/OT evaluating.Waiting for insurance approval.  2. DM/Hyperglycemia: Per primary -blood sugars up and down. Per primary. 3. ESRD - Continue CCPD per home orders. Have been using 2.5% solution all exchanges. d/c lasix d/t little urine output. On Na bicarb - last bicarb 23. 4. Hypertension/volume -BP well controlled.Notantihypertensive meds as OP. Amlodipine startedhere on3/13 Continue PD with same orders.Significantly under edw, down to 67.9kg on 3/19.Does not appear volume overload. Will need new edw at d/c.  5. Anemia - HGB initially 7.7 fell to 5.4 on3/12 and transfused two units PRBCs, up to 9.9 but now down to 7.5 post  surgery.Heme neg 3/11 Had Mircera 225 mcg and Venofer 200 mg IV 03/18/2019. 3/12 tsat 23% ferritin 1688 CRP 15.9. No iron d/t ^ferritin. Due for ESA next week. Transfuse as indicated.  6. Metabolic bone disease -CCa in goal. Last Phos^8.0. Added binders w/snacks. ContinueVDRA.  7. Nutrition - Albumin low add prostat, renal vits.-Renal/carb modified diet.  Jen Mow, PA-C [name] Pager: [phone] 03/29/2019,12:44 PM  LOS: 10 days

## 2019-03-29 NOTE — Progress Notes (Signed)
Writer completed CCPD tx on pt. Pt received Hepain IP today. Pt denies complaint. Exit site care completed by Probation officer. Gent cream applied to site (2) 2.5% solution were used for today's tx. Writer left pt stable.

## 2019-03-29 NOTE — Progress Notes (Signed)
PROGRESS NOTE    Jorge [name]  KZL:[id number] DOB: 01/01/1971 DOA: 03/19/2019 PCP: Patient, No Pcp Per    Brief Narrative:  Jorge [name] is a 49 y.o. male with medical history significant for ESRD on peritoneal dialysis, hypertension, insulin-dependent diabetes mellitus, and anemia, now presenting to emergency department for evaluation of swelling, drainage, and following up from his right foot.  Patient reports that he has had an ulcer at the plantar aspect of his left foot for at least a couple months and developed ulceration involving the right foot approximately 2 weeks ago.  The right foot has developed some swelling with foul odor and drainage over the past week or so.  He denies any fevers or chills, denies pain, denies any shortness of breath, cough, or sore throat.  Reports that he recently went to the area, is established with a nephrologist, but does not have a PCP yet.  Upon arrival to the ED, patient is found to be afebrile, saturating well on room air, and stable pressure. Chemistry panel notable for glucose of 321, BUN 78, bicarbonate 13, anion gap 20, and albumin 2.1.  CBC features a leukocytosis to 16,200 and normocytic anemia: 7.7, down from 10.3 last month.  Lactic acid was elevated to 2.4 initially.  Fecal occult blood testing was negative.  Blood cultures were collected in the emergency department, broad-spectrum antibiotics were started, and orthopedic surgeon and nephrologist were consulted by the ED physician.  COVID-19 screening test has not yet resulted.   Assessment & Plan:   Principal Problem:   Diabetic foot infection ([place]) Active Problems:   ESRD (end stage renal disease) ([place])   Hypertension   Insulin-requiring or dependent type II diabetes mellitus ([place])   Normocytic anemia   ESRD on peritoneal dialysis ([place])   Osteomyelitis of right foot ([place])   Gangrene of right foot ([name])   Gangrene of left foot ([name])   Malnutrition of moderate degree   Sepsis  secondary to osteomyelitisGangrene bilateral feet, POA Patient presenting with ulcerations to bilateral feet with malodorous discharge.  X-ray left foot with soft tissue defect adjacent plantar calcaneus consistent with ulceration.  Right foot x-ray with soft tissue gas projected over the 1/2 metatarsophalangeal joints suspicion for gangrene, erosions/ bony destruction 2nd proximal phalanx and likely great toe proximal phalanx concerning for osteomyelitis.  CRP elevated 10.1 and ESR greater than 140.  Antibiotics with vancomycin and Zosyn discontinued 24 hours postoperatively. --status post bilateral BKA by Dr. Sharol Given 03/25/2019 --continue wound VAC;  Maintain 1 week following discharge per ortho --nonweightbearing bilateral lower extremities --pending CIR given patient on peritoneal dialysis and SNFs do not accommodate PD --Norco prn for pain control  ESRD on peritoneal dialysis --nephrology following; appreciate assistance  Anemia of chronic Disease/acute postoperative blood loss anemia Iron level 38, TIBC 162, ferritin 1,688, and folate 34.9; consistent with anemia of chronic disease. --Hgb 7.7-->5.4-->9.1.Marland KitchenMarland Kitchen7.4-->7.6-->7.5 --s/p transfusion 3u pRBC on 03/20/2019  Essential hypertension BP 123/55 this am --amlodipine 10 mg p.o. daily --clonidine 0.16m po qHS  HLD:  Continue atorvastatin 20 mg p.o. daily  Insulin-dependent diabetes mellitus Hemoglobin A1c 7.8, not well controlled.  Home regimen includes Lantus 10 units subcutaneously daily, Novolin insulin sliding scale. --Lantus 28 units subcutaneously daily --Novolog 4u TID AC --moderate insulin sliding scale to sensitive for further coverage --continue monitor CBG's qAC/HS   DVT prophylaxis:  SCDs Code Status:  Full code Family Communication:  Discussed with patient extensively at bedside Disposition Plan:      Patient is  from: home     Anticipated Disposition:  CIR given need for peritoneal dialysis     Barriers to discharge or  conditions that needs to be met prior to discharge: Awaiting insurance authorization for CIR.    Consultants:    Orthopedics, Dr. Sharol Given  Procedures:   BKA, bilateral - Dr Sharol Given 3/17  Antimicrobials:    vancomycin 3/11 - 3/18   Zosyn 3/11 - 3/18   Subjective: Patient seen examined bedside, resting comfortably. Pain controlled.  No other complaints. Denies headache, no fever/ chills/ night sweats, no nausea/vomiting / diarrhea, no chest pain, palpitations, no shortness of breath, no abdominal pain.  No acute concerns overnight per nursing staff.  Objective: Vitals:   03/28/19 1838 03/28/19 2007 03/29/19 0333 03/29/19 0848  BP: 118/60 (!) 106/51 111/61 (!) 110/48  Pulse: 72 73 74 70  Resp: 16   16  Temp: 98.4 F (36.9 C) 98.6 F (37 C) 98.1 F (36.7 C) 98.2 F (36.8 C)  TempSrc: Oral Oral Oral Oral  SpO2:  93% 97% 100%  Weight: 67.8 kg  68.3 kg   Height:        Intake/Output Summary (Last 24 hours) at 03/29/2019 1144 Last data filed at 03/29/2019 1000 Gross per 24 hour  Intake 480 ml  Output 6864 ml  Net -6384 ml   Filed Weights   03/28/19 1055 03/28/19 1838 03/29/19 0333  Weight: 67.5 kg 67.8 kg 68.3 kg    Examination:  General exam: Appears calm and comfortable  Respiratory system: Clear to auscultation. Respiratory effort normal. Cardiovascular system: S1 & S2 heard, RRR. No JVD, murmurs, rubs, gallops or clicks. No pedal edema. Gastrointestinal system: Abdomen is nondistended, soft and nontender. No organomegaly or masses felt. Normal bowel sounds heard. Central nervous system: Alert and oriented. No focal neurological deficits. Extremities: Symmetric 5 x 5 power.  Bilateral BKA noted with wound vacs in place Skin: No rashes, lesions or ulcers Psychiatry: Judgement and insight appear normal. Mood & affect appropriate.     Data Reviewed: I have personally reviewed following labs and imaging studies  CBC: Recent Labs  Lab 03/24/19 0402 03/25/19 0201  03/26/19 0748 03/27/19 0812 03/28/19 0404  WBC 13.3* 12.0* 13.0* 13.0* 13.4*  NEUTROABS  --  8.2*  --   --   --   HGB 9.2* 9.8* 7.4* 7.6* 7.5*  HCT 29.0* 30.5* 24.0* 23.8* 24.1*  MCV 94.5 95.0 96.8 96.0 96.4  PLT 274 286 217 221 675   Basic Metabolic Panel: Recent Labs  Lab 03/23/19 0123 03/23/19 0123 03/24/19 0402 03/25/19 0201 03/26/19 0748 03/27/19 0812 03/28/19 0404  NA 133*   < > 135 133* 135 134* 135  K 4.3   < > 3.8 4.2 4.2 4.2 4.0  CL 98   < > 94* 93* 96* 94* 92*  CO2 18*   < > 21* 20* 21* 23 23  GLUCOSE 205*   < > 232* 280* 332* 186* 244*  BUN 84*   < > 76* 77* 74* 82* 88*  CREATININE 12.95*   < > 12.40* 12.58* 12.43* 13.00* 13.50*  CALCIUM 8.2*   < > 8.5* 8.5* 7.9* 8.1* 8.3*  PHOS 6.5*  --  6.4*  --  7.2* 8.0*  --    < > = values in this interval not displayed.   GFR: Estimated Creatinine Clearance: 6.3 mL/min (A) (by C-G formula based on SCr of 13.5 mg/dL (H)). Liver Function Tests: Recent Labs  Lab 03/23/19 0123 03/24/19 0402  03/26/19 0748 03/27/19 0812  ALBUMIN 1.6* 1.6* 1.7* 1.6*   No results for input(s): LIPASE, AMYLASE in the last 168 hours. No results for input(s): AMMONIA in the last 168 hours. Coagulation Profile: No results for input(s): INR, PROTIME in the last 168 hours. Cardiac Enzymes: No results for input(s): CKTOTAL, CKMB, CKMBINDEX, TROPONINI in the last 168 hours. BNP (last 3 results) No results for input(s): PROBNP in the last 8760 hours. HbA1C: No results for input(s): HGBA1C in the last 72 hours. CBG: Recent Labs  Lab 03/28/19 0654 03/28/19 1208 03/28/19 1626 03/28/19 2132 03/29/19 0631  GLUCAP 211* 91 78 183* 146*   Lipid Profile: No results for input(s): CHOL, HDL, LDLCALC, TRIG, CHOLHDL, LDLDIRECT in the last 72 hours. Thyroid Function Tests: No results for input(s): TSH, T4TOTAL, FREET4, T3FREE, THYROIDAB in the last 72 hours. Anemia Panel: No results for input(s): VITAMINB12, FOLATE, FERRITIN, TIBC, IRON,  RETICCTPCT in the last 72 hours. Sepsis Labs: No results for input(s): PROCALCITON, LATICACIDVEN in the last 168 hours.  Recent Results (from the past 240 hour(s))  Culture, blood (routine x 2)     Status: None   Collection Time: 03/19/19  7:37 PM   Specimen: BLOOD LEFT HAND  Result Value Ref Range Status   Specimen Description BLOOD LEFT HAND  Final   Special Requests   Final    BOTTLES DRAWN AEROBIC AND ANAEROBIC Blood Culture results may not be optimal due to an inadequate volume of blood received in culture bottles   Culture   Final    NO GROWTH 5 DAYS Performed at [facility], [place] [address] [place], [place] [address]    Report Status 03/24/2019 FINAL  Final  Culture, blood (routine x 2)     Status: None   Collection Time: 03/19/19  8:16 PM   Specimen: BLOOD  Result Value Ref Range Status   Specimen Description BLOOD SITE NOT SPECIFIED  Final   Special Requests   Final    BOTTLES DRAWN AEROBIC AND ANAEROBIC Blood Culture adequate volume   Culture   Final    NO GROWTH 5 DAYS Performed at [facility], [place] [address] [place], [place] [address]    Report Status 03/24/2019 FINAL  Final  SARS CORONAVIRUS 2 (TAT 6-24 HRS) Nasopharyngeal Nasopharyngeal Swab     Status: None   Collection Time: 03/19/19  8:59 PM   Specimen: Nasopharyngeal Swab  Result Value Ref Range Status   SARS Coronavirus 2 NEGATIVE NEGATIVE Final    Comment: (NOTE) SARS-CoV-2 target nucleic acids are NOT DETECTED. The SARS-CoV-2 RNA is generally detectable in upper and lower respiratory specimens during the acute phase of infection. Negative results do not preclude SARS-CoV-2 infection, do not rule out co-infections with other pathogens, and should not be used as the sole basis for treatment or other patient management decisions. Negative results must be combined with clinical observations, patient history, and epidemiological information. The expected result is Negative. Fact Sheet for  Patients: SugarRoll.be Fact Sheet for Healthcare Providers: https://www.woods-mathews.com/ This test is not yet approved or cleared by the [place] FDA and  has been authorized for detection and/or diagnosis of SARS-CoV-2 by FDA under an Emergency Use Authorization (EUA). This EUA will remain  in effect (meaning this test can be used) for the duration of the COVID-19 declaration under Section 56 4(b)(1) of the Act, 21 U.S.C. section 360bbb-3(b)(1), unless the authorization is terminated or revoked sooner. Performed at [facility], [place]  [address] [place], [place] [address]   Surgical pcr screen     Status: None   Collection Time: 03/20/19  2:56 AM   Specimen: Nasal Mucosa; Nasal Swab  Result Value Ref Range Status   MRSA, PCR NEGATIVE NEGATIVE Final   Staphylococcus aureus NEGATIVE NEGATIVE Final    Comment: (NOTE) The Xpert SA Assay (FDA approved for NASAL specimens in patients 49 years of age and older), is one component of a comprehensive surveillance program. It is not intended to diagnose infection nor to guide or monitor treatment. Performed at [facility], [place] [address], [place], [place] [address]          Radiology Studies: No results found.      Scheduled Meds: . amLODipine  10 mg Oral Daily  . atorvastatin  20 mg Oral q1800  . calcitRIOL  0.5 mcg Oral Once per day on Mon Wed Fri  . cloNIDine  0.1 mg Oral QHS  . [START ON 04/01/2019] darbepoetin (ARANESP) injection - DIALYSIS  200 mcg Intravenous Q Wed-HD  . docusate sodium  100 mg Oral BID  . feeding supplement (PRO-STAT SUGAR FREE 64)  30 mL Oral TID BM  . gabapentin  100 mg Oral TID  . gentamicin cream  1 application Topical Daily  . insulin aspart  0-15 Units Subcutaneous TID WC  . insulin aspart  4 Units Subcutaneous TID WC  . insulin glargine  28 Units Subcutaneous Daily  . multivitamin  1 tablet Oral QHS  . sevelamer carbonate  2,400 mg  Oral TID WC  . sodium bicarbonate  1,300 mg Oral TID  . sodium chloride flush  3 mL Intravenous Q12H  . sodium chloride flush  3 mL Intravenous Q12H   Continuous Infusions: . sodium chloride    . sodium chloride Stopped (03/25/19 1745)  . dialysis solution 2.5% low-MG/low-CA    . methocarbamol (ROBAXIN) IV       LOS: 10 days    Time spent: 33 minutes spent on chart review, discussion with nursing staff, consultants, updating family and interview/physical exam; more than 50% of that time was spent in counseling and/or coordination of care.    Armonte Tortorella J [place] DO [facility] Available via Epic secure chat 7am-7pm After these hours, please refer to coverage provider listed on [email or web address] 03/29/2019, 11:44 AM

## 2019-03-29 NOTE — Progress Notes (Signed)
Hypoglycemic Event  CBG: 39  Treatment: Sprite/Snack  Symptoms: fatigue  Follow-up CBG: Time:1219 CBG Result:99  Possible Reasons for Event: Insulin  Comments/MD notified: Yes, will d/c meal coverage insulin.     Royston Cowper Graciella Arment RN

## 2019-03-30 LAB — GLUCOSE, CAPILLARY
Glucose-Capillary: 120 mg/dL — ABNORMAL HIGH (ref 70–99)
Glucose-Capillary: 126 mg/dL — ABNORMAL HIGH (ref 70–99)
Glucose-Capillary: 131 mg/dL — ABNORMAL HIGH (ref 70–99)
Glucose-Capillary: 149 mg/dL — ABNORMAL HIGH (ref 70–99)
Glucose-Capillary: 67 mg/dL — ABNORMAL LOW (ref 70–99)
Glucose-Capillary: 98 mg/dL (ref 70–99)

## 2019-03-30 MED ORDER — DARBEPOETIN ALFA 200 MCG/0.4ML IJ SOSY: 200.0000 ug | PREFILLED_SYRINGE | INTRAMUSCULAR | Status: DC

## 2019-03-30 MED ORDER — INSULIN GLARGINE 100 UNIT/ML ~~LOC~~ SOLN
25.0000 [IU] | Freq: Every day | SUBCUTANEOUS | Status: DC
  Administered 2019-03-30: 25 [IU] via SUBCUTANEOUS
  Filled 2019-03-30 (×3): qty 0.25

## 2019-03-30 NOTE — Progress Notes (Signed)
Physical Therapy Treatment Patient Details Name: Jorge [name] MRN: [id number] DOB: 1970-04-27 Today's Date: 03/30/2019    History of Present Illness Pt is a 49 y/o male now s/p bilateral transtibial amputations secondary to gangrene. PMH including but not limited to DM, HTN and ESRD on PD.    PT Comments    Pt very pleasant and eager to work with therapy. Pt very motivated to get stronger and have prostheses. Pt with continued improvement in lateral transfers with maintained need for assist due to fatigue and bil UE weakness. Pt educated for arm rest push up and performed x 5 in additional to supine for 30 min during the day to stretch hip flexor and educated for all amputee HEP. Pt able to remove and don limb protectors and left off end of session for additional HEP practice and educated for need to don prior to mobility or sleeping.    Follow Up Recommendations  CIR     Equipment Recommendations  Wheelchair (measurements PT);Wheelchair cushion (measurements PT);Other (comment)(amputee pads)    Recommendations for Other Services       Precautions / Restrictions Precautions Precautions: Fall Precaution Comments: wound VACs bilateral residual limbs, limb protectors    Mobility  Bed Mobility Overal bed mobility: Needs Assistance Bed Mobility: Supine to Sit     Supine to sit: Min guard     General bed mobility comments: pt able to transition from supine to long sitting without assist  Transfers Overall transfer level: Needs assistance   Transfers: Lateral/Scoot Transfers          Lateral/Scoot Transfers: Min assist General transfer comment: pt able to initiate lateral transition from bed to drop arm recliner. He required assist with help of pad to complete transition to chair with cues for UE use, anterior translation of trunk and assist due to fatigue  Ambulation/Gait                 Stairs             Wheelchair Mobility    Modified Rankin  (Stroke Patients Only)       Balance Overall balance assessment: Needs assistance Sitting-balance support: Bilateral upper extremity supported Sitting balance-Leahy Scale: Fair Sitting balance - Comments: able to sit in long sitting with transitions needs bil UE support with tendency for posterior bias                                    Cognition Arousal/Alertness: Awake/alert Behavior During Therapy: WFL for tasks assessed/performed Overall Cognitive Status: Within Functional Limits for tasks assessed                                        Exercises Amputee Exercises Hip ABduction/ADduction: AROM;Strengthening;Both;15 reps;Seated Knee Extension: AROM;Strengthening;Both;Seated;15 reps Straight Leg Raises: AROM;Both;15 reps;Seated    General Comments        Pertinent Vitals/Pain Pain Score: 4  Pain Location: left shin Pain Descriptors / Indicators: Sore Pain Intervention(s): Limited activity within patient's tolerance;Monitored during session;Repositioned    Home Living                      Prior Function            PT Goals (current goals can now be found in the care plan section) Progress towards  PT goals: Progressing toward goals    Frequency    Min 5X/week      PT Plan Current plan remains appropriate    Co-evaluation              AM-PAC PT "6 Clicks" Mobility   Outcome Measure  Help needed turning from your back to your side while in a flat bed without using bedrails?: None Help needed moving from lying on your back to sitting on the side of a flat bed without using bedrails?: None Help needed moving to and from a bed to a chair (including a wheelchair)?: A Little Help needed standing up from a chair using your arms (e.g., wheelchair or bedside chair)?: Total Help needed to walk in hospital room?: Total Help needed climbing 3-5 steps with a railing? : Total 6 Click Score: 14    End of Session  Equipment Utilized During Treatment: Gait belt Activity Tolerance: Patient tolerated treatment well Patient left: in chair;with call bell/phone within reach Nurse Communication: Mobility status PT Visit Diagnosis: Other abnormalities of gait and mobility (R26.89)     Time: 2094-7096 PT Time Calculation (min) (ACUTE ONLY): 20 min  Charges:  $Therapeutic Activity: 8-22 mins                     [name] P, PT Acute Rehabilitation Services Pager: [phone] Office: [place] [facility] E[place] 03/30/2019, 1:25 PM

## 2019-03-30 NOTE — Progress Notes (Signed)
Inpatient Rehabilitation Admissions Coordinator  I have received approval for CIR with his [place] insurance. I will follow up tomorrow to clarify when Cir bed will be available for this patient.Patient is aware.  Danne Baxter, RN, MSN Rehab Admissions Coordinator [phone] 03/30/2019 4:21 PM

## 2019-03-30 NOTE — Progress Notes (Signed)
Occupational Therapy Treatment Patient Details Name: Jorge [name] MRN: [id number] DOB: [birth year] Today's Date: 03/30/2019    History of present illness Pt is a 49 y/o male now s/p bilateral transtibial amputations secondary to gangrene. PMH including but not limited to DM, HTN and ESRD on PD.   OT comments  Pt continues to progress toward established OT goals. Pt completed general UE HEP with level 2 theraband this session with seated in recliner. He was able to use trunk to self-correct unsupported sitting balance for 5 min this session. Educated pt on desensitization strategies associated with phantom limb sensation and phantom limb pain and educated pt on grieving process associated with amputations. Discussed support group for individuals with amputations, pt reports he would appreciate more information regarding this group. Pt will continue to benefit from skilled OT services to maximize safety and independence with ADL/IADL and functional mobility. Will continue to follow acutely and progress as tolerated.    Follow Up Recommendations  CIR    Equipment Recommendations  Other (comment)(defer to next venue)    Recommendations for Other Services Rehab consult    Precautions / Restrictions Precautions Precautions: Fall Precaution Comments: wound VACs bilateral residual limbs, limb protectors Restrictions Weight Bearing Restrictions: Yes RLE Weight Bearing: Non weight bearing LLE Weight Bearing: Non weight bearing       Mobility Bed Mobility Overal bed mobility: Needs Assistance Bed Mobility: Supine to Sit     Supine to sit: Min guard     General bed mobility comments: pt sitting in recliner upon arrival  Transfers Overall transfer level: Needs assistance   Transfers: Lateral/Scoot Transfers          Lateral/Scoot Transfers: Min assist General transfer comment: deferred this session    Balance Overall balance assessment: Needs assistance Sitting-balance  support: No upper extremity supported;Feet unsupported Sitting balance-Leahy Scale: Fair Sitting balance - Comments: able to sit unsupported in upright posture for 2 min without LOB and pt able to self-correct with UE support and with trunk control                                   ADL either performed or assessed with clinical judgement   ADL Overall ADL's : Needs assistance/impaired     Grooming: Set up;Sitting           Upper Body Dressing : Set up;Sitting                     General ADL Comments: session focused on UE HEP and general core strengthenign     Vision       Perception     Praxis      Cognition Arousal/Alertness: Awake/alert Behavior During Therapy: WFL for tasks assessed/performed Overall Cognitive Status: Within Functional Limits for tasks assessed                                 General Comments: pt is highly motivated to work with therapy and states "ill do whatever I have to to get stronger";educated pt on grieving process associated with amputations and educated him on desensitization strategies associated with phantom limb sensation and phantom limb pain        Exercises Exercises: Amputee;General Upper Extremity General Exercises - Upper Extremity Shoulder Extension: Both;10 reps;Strengthening;Seated;Theraband Theraband Level (Shoulder Extension): Level 2 (Red) Shoulder Horizontal ABduction: Both;10 reps;Strengthening;Seated;Theraband  Theraband Level (Shoulder Horizontal Abduction): Level 2 (Red) Shoulder Horizontal ADduction: Both;10 reps;Strengthening;Seated;Theraband Theraband Level (Shoulder Horizontal Adduction): Level 2 (Red) Elbow Flexion: Both;10 reps;Strengthening;Seated;Theraband Theraband Level (Elbow Flexion): Level 2 (Red) Elbow Extension: Both;10 reps;Strengthening;Seated;Theraband Theraband Level (Elbow Extension): Level 2 (Red) Amputee Exercises Hip ABduction/ADduction:  AROM;Strengthening;Both;15 reps;Seated Knee Flexion: AROM;Strengthening;Both;10 reps;Seated Knee Extension: AROM;Strengthening;Both;Seated;10 reps Straight Leg Raises: AROM;Both;15 reps;Seated Chair Push Up: Both;10 reps;Seated Other Exercises Other Exercises: sitting balance utilizing trunk to correct for 15min  Other Exercises: educated pt on desensitization of BLE to assist with phantom limb sensation and phantom limb pain   Shoulder Instructions       General Comments vss    Pertinent Vitals/ Pain       Pain Assessment: 0-10 Pain Score: 3  Pain Location: BLE  Pain Descriptors / Indicators: Sore Pain Intervention(s): Limited activity within patient's tolerance;Monitored during session  Home Living                                          Prior Functioning/Environment              Frequency  Min 2X/week        Progress Toward Goals  OT Goals(current goals can now be found in the care plan section)  Progress towards OT goals: Progressing toward goals  Acute Rehab OT Goals Patient Stated Goal: get UB stronger OT Goal Formulation: With patient Time For Goal Achievement: 04/15/19 Potential to Achieve Goals: Good ADL Goals Pt Will Perform Grooming: with set-up;sitting Pt Will Perform Upper Body Bathing: with set-up;sitting Pt Will Perform Lower Body Bathing: with min assist;sitting/lateral leans Pt Will Perform Upper Body Dressing: with set-up Pt Will Transfer to Toilet: with min assist;anterior/posterior transfer;bedside commode  Plan Discharge plan remains appropriate    Co-evaluation                 AM-PAC OT "6 Clicks" Daily Activity     Outcome Measure   Help from another person eating meals?: None Help from another person taking care of personal grooming?: A Little Help from another person toileting, which includes using toliet, bedpan, or urinal?: A Little Help from another person bathing (including washing, rinsing,  drying)?: A Lot Help from another person to put on and taking off regular upper body clothing?: A Little Help from another person to put on and taking off regular lower body clothing?: A Lot 6 Click Score: 17    End of Session    OT Visit Diagnosis: Other abnormalities of gait and mobility (R26.89);Muscle weakness (generalized) (M62.81)   Activity Tolerance Patient tolerated treatment well   Patient Left in chair;with call bell/phone within reach   Nurse Communication Mobility status        Time: 1450-1521 OT Time Calculation (min): 31 min  Charges: OT General Charges $OT Visit: 1 Visit OT Treatments $Self Care/Home Management : 8-22 mins $Therapeutic Exercise: 8-22 mins  Helene Kelp OTR/L Acute Rehabilitation Services Office: [place] 03/30/2019, 4:29 PM

## 2019-03-30 NOTE — Progress Notes (Signed)
Inpatient Rehabilitation Admissions Coordinator  I have not received a approval from pt's [employer] insurance to date. I met with patient at bedside and he is aware. I will follow up once insurance makes a determination.  Danne Baxter, RN, MSN Rehab Admissions Coordinator [phone] 03/30/2019 10:53 AM

## 2019-03-30 NOTE — Progress Notes (Signed)
[facility] Progress Note   Dialysis Orders: [facility] CCPD 7 Days weekly 80 kg 4 exchanges 1.6 mls Dwell time 1.5 hr, No pause or day dwells. Uses mostly 2.5% solution  BMD meds: -Renvela 800 mg 3 tabs PO TID with meals -Calcitriol 0.5 mcg PO TIW (MWF) - dose lowered due to  Assessment/Plan: 1. Gangrene/ bilateral Osteo feet with sever PVD-s/p bilateral BKA 3/17Planning/hopingfor CIR which will be ideal because he is on CCPD and this cannot be accomodated at a SNF. PT/OT evaluating.Waiting for insurance approval. 2. DM/Hyperglycemia: Per primary -blood sugars up and down. Per primary. 3. ESRD - Continue CCPD per home orders. Have been using 2.5% solution all exchanges. d/c lasix d/t little urine output. On Na bicarb - no labs since 3/20 - will order for Tuesday am near the end of PD treatemtn 4. Hypertension/volume -BP well controlled.Notantihypertensive meds as OP. Amlodipine startedhere on3/13 Continue PD with same orders.Significantly under edw, down to 67.9kgon 3/19.Does not appear volume overload. Will need new edw at d/c. Net UF 2.3 3/21 1120 3/22- stump protectors adding to total weights - need to be taking into consideration 5. Anemia - HGB initially 7.7 fell to 5.4 on3/12 and transfused two units PRBCs, up to 9.9 but now down to 7.5post surgery.Heme neg 3/11 Had Mircera 225 mcg and Venofer 200 mg IV 03/18/2019. 3/12 tsat 23% ferritin 1688 CRP 15.9.suspect Ferritin is inflammatory will repeat studies Due for ESA this week. Transfuse as indicated.  6. Metabolic bone disease -CCa in goal.LastPhos^8.0. Addedbinders w/snacks. ContinueVDRA.  7. Nutrition - Albumin low add prostat, renal vits.-Renal/carb modified diet. Alb very low - ask RD to see - needs more protein and kcal  Myriam Jacobson, PA-C [facility] Beeper [phone] 03/30/2019,10:11 AM  LOS: 11 days   Subjective:   Didn't get all of ordered  breakfast-  Objective Vitals:   03/29/19 1953 03/30/19 0402 03/30/19 0730 03/30/19 0750  BP: (!) 109/48 (!) 106/52 110/62 (!) 137/53  Pulse: 81 66 66 74  Resp:   18 17  Temp: 97.8 F (36.6 C) 97.8 F (36.6 C) 97.8 F (36.6 C) 98.1 F (36.7 C)  TempSrc: Oral Oral Oral Oral  SpO2: 99% 95% 98% 98%  Weight:  71 kg    Height:       Physical Exam General:slender male NAD on room air Heart: RRR Lungs: no rales Abdomen: + BS soft  Extremities:bilateral BKA with VAC no appreciable edema Dialysis Access:  PD cath   Additional Objective Labs: Basic Metabolic Panel: Recent Labs  Lab 03/24/19 0402 03/25/19 0201 03/26/19 0748 03/27/19 0812 03/28/19 0404  NA 135   < > 135 134* 135  K 3.8   < > 4.2 4.2 4.0  CL 94*   < > 96* 94* 92*  CO2 21*   < > 21* 23 23  GLUCOSE 232*   < > 332* 186* 244*  BUN 76*   < > 74* 82* 88*  CREATININE 12.40*   < > 12.43* 13.00* 13.50*  CALCIUM 8.5*   < > 7.9* 8.1* 8.3*  PHOS 6.4*  --  7.2* 8.0*  --    < > = values in this interval not displayed.   Liver Function Tests: Recent Labs  Lab 03/24/19 0402 03/26/19 0748 03/27/19 0812  ALBUMIN 1.6* 1.7* 1.6*   No results for input(s): LIPASE, AMYLASE in the last 168 hours. CBC: Recent Labs  Lab 03/24/19 0402 03/24/19 0402 03/25/19 0201 03/25/19 0201 03/26/19 2409  03/27/19 0812 03/28/19 0404  WBC 13.3*   < > 12.0*   < > 13.0* 13.0* 13.4*  NEUTROABS  --   --  8.2*  --   --   --   --   HGB 9.2*   < > 9.8*   < > 7.4* 7.6* 7.5*  HCT 29.0*   < > 30.5*   < > 24.0* 23.8* 24.1*  MCV 94.5  --  95.0  --  96.8 96.0 96.4  PLT 274   < > 286   < > 217 221 209   < > = values in this interval not displayed.   Blood Culture    Component Value Date/Time   SDES BLOOD SITE NOT SPECIFIED 03/19/2019 2016   SPECREQUEST  03/19/2019 2016    BOTTLES DRAWN AEROBIC AND ANAEROBIC Blood Culture adequate volume   CULT  03/19/2019 2016    NO GROWTH 5 DAYS Performed at [facility], [place] [address],  [place], [name] [address]    REPTSTATUS 03/24/2019 FINAL 03/19/2019 2016    Cardiac Enzymes: No results for input(s): CKTOTAL, CKMB, CKMBINDEX, TROPONINI in the last 168 hours. CBG: Recent Labs  Lab 03/29/19 1155 03/29/19 1219 03/29/19 1523 03/29/19 2106 03/30/19 0656  GLUCAP 39* 99 154* 201* 149*   Iron Studies: No results for input(s): IRON, TIBC, TRANSFERRIN, FERRITIN in the last 72 hours. No results found for: INR, PROTIME Studies/Results: No results found. Medications: . sodium chloride    . sodium chloride Stopped (03/25/19 1745)  . dialysis solution 2.5% low-MG/low-CA    . dialysis solution 2.5% low-MG/low-CA    . methocarbamol (ROBAXIN) IV     . amLODipine  10 mg Oral Daily  . atorvastatin  20 mg Oral q1800  . calcitRIOL  0.5 mcg Oral Once per day on Mon Wed Fri  . cloNIDine  0.1 mg Oral QHS  . [START ON 04/01/2019] darbepoetin (ARANESP) injection - DIALYSIS  200 mcg Intravenous Q Wed-HD  . docusate sodium  100 mg Oral BID  . feeding supplement (PRO-STAT SUGAR FREE 64)  30 mL Oral TID BM  . gabapentin  100 mg Oral TID  . gentamicin cream  1 application Topical Daily  . insulin aspart  0-15 Units Subcutaneous TID WC  . insulin glargine  25 Units Subcutaneous Daily  . multivitamin  1 tablet Oral QHS  . sevelamer carbonate  2,400 mg Oral TID WC  . sodium bicarbonate  1,300 mg Oral TID  . sodium chloride flush  3 mL Intravenous Q12H  . sodium chloride flush  3 mL Intravenous Q12H

## 2019-03-30 NOTE — Progress Notes (Signed)
This is a pleasant gentleman who is status post bilateral below-knee amputations.  He has been working hard with physical therapy and plan is for inpatient rehab.  His only complaint is of some soreness on the upper outside of his right greater than left leg.  He denies any pain at the end of the stumps  Focused exam both wound vacs are working there is 0 cc in the canister.  He seems to be tender over the fibular head he has no warmth or swelling in either of his knees.  No tenderness in the knees.  He is wearing his limb protectors in bed  We discussed removing the limb protectors when he is not up and about it may just be from pressure areas where the limb protector is hitting the outside of his leg.  He understands limb protectors are to protect him when he is moving about in his room he is in agreement with the plan

## 2019-03-30 NOTE — Progress Notes (Signed)
PROGRESS NOTE    Jorge [name]  XYI:[id number] DOB: [birth year] DOA: 03/19/2019 PCP: Patient, No Pcp Per    Brief Narrative:  Jorge [name] is a 49 y.o. male with medical history significant for ESRD on peritoneal dialysis, hypertension, insulin-dependent diabetes mellitus, and anemia, now presenting to emergency department for evaluation of swelling, drainage, and following up from his right foot.  Patient reports that he has had an ulcer at the plantar aspect of his left foot for at least a couple months and developed ulceration involving the right foot approximately 2 weeks ago.  The right foot has developed some swelling with foul odor and drainage over the past week or so.  He denies any fevers or chills, denies pain, denies any shortness of breath, cough, or sore throat.  Reports that he recently went to the area, is established with a nephrologist, but does not have a PCP yet.  Upon arrival to the ED, patient is found to be afebrile, saturating well on room air, and stable pressure. Chemistry panel notable for glucose of 321, BUN 78, bicarbonate 13, anion gap 20, and albumin 2.1.  CBC features a leukocytosis to 16,200 and normocytic anemia: 7.7, down from 10.3 last month.  Lactic acid was elevated to 2.4 initially.  Fecal occult blood testing was negative.  Blood cultures were collected in the emergency department, broad-spectrum antibiotics were started, and orthopedic surgeon and nephrologist were consulted by the ED physician.  COVID-19 screening test has not yet resulted.   Assessment & Plan:   Principal Problem:   Diabetic foot infection (Jorge [name]) Active Problems:   ESRD (end stage renal disease) ([place])   Hypertension   Insulin-requiring or dependent type II diabetes mellitus ([place])   Normocytic anemia   ESRD on peritoneal dialysis ([place])   Osteomyelitis of right foot ([place])   Gangrene of right foot ([name])   Gangrene of left foot ([name])   Malnutrition of moderate degree   Sepsis  secondary to osteomyelitisGangrene bilateral feet, POA Patient presenting with ulcerations to bilateral feet with malodorous discharge.  X-ray left foot with soft tissue defect adjacent plantar calcaneus consistent with ulceration.  Right foot x-ray with soft tissue gas projected over the 1/2 metatarsophalangeal joints suspicion for gangrene, erosions/ bony destruction 2nd proximal phalanx and likely great toe proximal phalanx concerning for osteomyelitis.  CRP elevated 10.1 and ESR greater than 140.  Antibiotics with vancomycin and Zosyn discontinued 24 hours postoperatively. --status post bilateral BKA by Dr. Sharol Given 03/25/2019 --continue wound VAC;  Maintain 1 week following discharge per ortho --nonweightbearing bilateral lower extremities --pending CIR given patient on peritoneal dialysis and SNFs do not accommodate PD --Norco prn for pain control  ESRD on peritoneal dialysis --nephrology following; appreciate assistance  Anemia of chronic Disease/acute postoperative blood loss anemia Iron level 38, TIBC 162, ferritin 1,688, and folate 34.9; consistent with anemia of chronic disease. --Hgb 7.7-->5.4-->9.1.Marland KitchenMarland Kitchen7.4-->7.6-->7.5 --s/p transfusion 3u pRBC on 03/20/2019  Essential hypertension BP 123/55 this am --amlodipine 10 mg p.o. daily --clonidine 0.41m po qHS  HLD:  Continue atorvastatin 20 mg p.o. daily  Insulin-dependent diabetes mellitus Hemoglobin A1c 7.8, not well controlled.  Home regimen includes Lantus 10 units subcutaneously daily, Novolin insulin sliding scale. --Hypoglycemic event yesterday with blood glucose 39; asymptomatic --Decrease Lantus from 28 to 25 units subcutaneously daily --Discontinued as scheduled mealtime NovoLog --continue moderate insulin sliding scale to sensitive for further coverage --continue monitor CBG's qAC/HS   DVT prophylaxis:  SCDs Code Status:  Full code Family Communication:  Discussed  with patient extensively at bedside Disposition Plan:       Patient is from: home     Anticipated Disposition:  CIR given need for peritoneal dialysis     Barriers to discharge or conditions that needs to be met prior to discharge: Awaiting insurance authorization for CIR.    Consultants:    Orthopedics, Dr. Sharol Given  Procedures:   BKA, bilateral - Dr Sharol Given 3/17  Antimicrobials:    vancomycin 3/11 - 3/18   Zosyn 3/11 - 3/18   Subjective: Patient seen examined bedside, resting comfortably. Pain controlled.  No other complaints. Denies headache, no fever/ chills/ night sweats, no nausea/vomiting / diarrhea, no chest pain, palpitations, no shortness of breath, no abdominal pain.  No acute concerns overnight per nursing staff.  Objective: Vitals:   03/29/19 0848 03/29/19 1552 03/29/19 1953 03/30/19 0402  BP: (!) 110/48 (!) 126/40 (!) 109/48 (!) 106/52  Pulse: 70 72 81 66  Resp: 16 18    Temp: 98.2 F (36.8 C) 98 F (36.7 C) 97.8 F (36.6 C) 97.8 F (36.6 C)  TempSrc: Oral Oral Oral Oral  SpO2: 100% 99% 99% 95%  Weight:    71 kg  Height:        Intake/Output Summary (Last 24 hours) at 03/30/2019 0740 Last data filed at 03/30/2019 0600 Gross per 24 hour  Intake 240 ml  Output 6514 ml  Net -6274 ml   Filed Weights   03/28/19 1838 03/29/19 0333 03/30/19 0402  Weight: 67.8 kg 68.3 kg 71 kg    Examination:  General exam: Appears calm and comfortable  Respiratory system: Clear to auscultation. Respiratory effort normal. Cardiovascular system: S1 & S2 heard, RRR. No JVD, murmurs, rubs, gallops or clicks. No pedal edema. Gastrointestinal system: Abdomen is nondistended, soft and nontender. No organomegaly or masses felt. Normal bowel sounds heard. Central nervous system: Alert and oriented. No focal neurological deficits. Extremities: Symmetric 5 x 5 power.  Bilateral BKA noted with stump protectors and wound vacs in place Skin: No rashes, lesions or ulcers Psychiatry: Judgement and insight appear normal. Mood & affect appropriate.      Data Reviewed: I have personally reviewed following labs and imaging studies  CBC: Recent Labs  Lab 03/24/19 0402 03/25/19 0201 03/26/19 0748 03/27/19 0812 03/28/19 0404  WBC 13.3* 12.0* 13.0* 13.0* 13.4*  NEUTROABS  --  8.2*  --   --   --   HGB 9.2* 9.8* 7.4* 7.6* 7.5*  HCT 29.0* 30.5* 24.0* 23.8* 24.1*  MCV 94.5 95.0 96.8 96.0 96.4  PLT 274 286 217 221 916   Basic Metabolic Panel: Recent Labs  Lab 03/24/19 0402 03/25/19 0201 03/26/19 0748 03/27/19 0812 03/28/19 0404  NA 135 133* 135 134* 135  K 3.8 4.2 4.2 4.2 4.0  CL 94* 93* 96* 94* 92*  CO2 21* 20* 21* 23 23  GLUCOSE 232* 280* 332* 186* 244*  BUN 76* 77* 74* 82* 88*  CREATININE 12.40* 12.58* 12.43* 13.00* 13.50*  CALCIUM 8.5* 8.5* 7.9* 8.1* 8.3*  PHOS 6.4*  --  7.2* 8.0*  --    GFR: Estimated Creatinine Clearance: 6.6 mL/min (A) (by C-G formula based on SCr of 13.5 mg/dL (H)). Liver Function Tests: Recent Labs  Lab 03/24/19 0402 03/26/19 0748 03/27/19 0812  ALBUMIN 1.6* 1.7* 1.6*   No results for input(s): LIPASE, AMYLASE in the last 168 hours. No results for input(s): AMMONIA in the last 168 hours. Coagulation Profile: No results for input(s): INR, PROTIME in the last  168 hours. Cardiac Enzymes: No results for input(s): CKTOTAL, CKMB, CKMBINDEX, TROPONINI in the last 168 hours. BNP (last 3 results) No results for input(s): PROBNP in the last 8760 hours. HbA1C: No results for input(s): HGBA1C in the last 72 hours. CBG: Recent Labs  Lab 03/29/19 1155 03/29/19 1219 03/29/19 1523 03/29/19 2106 03/30/19 0656  GLUCAP 39* 99 154* 201* 149*   Lipid Profile: No results for input(s): CHOL, HDL, LDLCALC, TRIG, CHOLHDL, LDLDIRECT in the last 72 hours. Thyroid Function Tests: No results for input(s): TSH, T4TOTAL, FREET4, T3FREE, THYROIDAB in the last 72 hours. Anemia Panel: No results for input(s): VITAMINB12, FOLATE, FERRITIN, TIBC, IRON, RETICCTPCT in the last 72 hours. Sepsis Labs: No  results for input(s): PROCALCITON, LATICACIDVEN in the last 168 hours.  No results found for this or any previous visit (from the past 240 hour(s)).       Radiology Studies: No results found.      Scheduled Meds: . amLODipine  10 mg Oral Daily  . atorvastatin  20 mg Oral q1800  . calcitRIOL  0.5 mcg Oral Once per day on Mon Wed Fri  . cloNIDine  0.1 mg Oral QHS  . [START ON 04/01/2019] darbepoetin (ARANESP) injection - DIALYSIS  200 mcg Intravenous Q Wed-HD  . docusate sodium  100 mg Oral BID  . feeding supplement (PRO-STAT SUGAR FREE 64)  30 mL Oral TID BM  . gabapentin  100 mg Oral TID  . gentamicin cream  1 application Topical Daily  . insulin aspart  0-15 Units Subcutaneous TID WC  . insulin glargine  28 Units Subcutaneous Daily  . multivitamin  1 tablet Oral QHS  . sevelamer carbonate  2,400 mg Oral TID WC  . sodium bicarbonate  1,300 mg Oral TID  . sodium chloride flush  3 mL Intravenous Q12H  . sodium chloride flush  3 mL Intravenous Q12H   Continuous Infusions: . sodium chloride    . sodium chloride Stopped (03/25/19 1745)  . dialysis solution 2.5% low-MG/low-CA    . dialysis solution 2.5% low-MG/low-CA    . methocarbamol (ROBAXIN) IV       LOS: 11 days    Time spent: 33 minutes spent on chart review, discussion with nursing staff, consultants, updating family and interview/physical exam; more than 50% of that time was spent in counseling and/or coordination of care.    Jessic Standifer J [place] DO [facility] Available via Epic secure chat 7am-7pm After these hours, please refer to coverage provider listed on [email or web address] 03/30/2019, 7:40 AM

## 2019-03-30 NOTE — Plan of Care (Signed)

## 2019-03-31 ENCOUNTER — Other Ambulatory Visit: Payer: Self-pay

## 2019-03-31 ENCOUNTER — Encounter (HOSPITAL_COMMUNITY): Payer: Self-pay | Admitting: Physical Medicine and Rehabilitation

## 2019-03-31 ENCOUNTER — Inpatient Hospital Stay (HOSPITAL_COMMUNITY)
Admission: RE | Admit: 2019-03-31 | Discharge: 2019-04-24 | DRG: 559 | Disposition: A | Discharge status: Skilled Nursing Facility | Payer: Medicare (Managed Care) | Source: Intra-hospital | Attending: Physical Medicine and Rehabilitation | Admitting: Physical Medicine and Rehabilitation

## 2019-03-31 DIAGNOSIS — Z992 Dependence on renal dialysis: Secondary | ICD-10-CM

## 2019-03-31 DIAGNOSIS — K5900 Constipation, unspecified: Secondary | ICD-10-CM | POA: Diagnosis not present

## 2019-03-31 DIAGNOSIS — E1143 Type 2 diabetes mellitus with diabetic autonomic (poly)neuropathy: Secondary | ICD-10-CM | POA: Diagnosis present

## 2019-03-31 DIAGNOSIS — E119 Type 2 diabetes mellitus without complications: Secondary | ICD-10-CM

## 2019-03-31 DIAGNOSIS — E1151 Type 2 diabetes mellitus with diabetic peripheral angiopathy without gangrene: Secondary | ICD-10-CM | POA: Diagnosis present

## 2019-03-31 DIAGNOSIS — E739 Lactose intolerance, unspecified: Secondary | ICD-10-CM | POA: Diagnosis present

## 2019-03-31 DIAGNOSIS — Z89512 Acquired absence of left leg below knee: Secondary | ICD-10-CM

## 2019-03-31 DIAGNOSIS — M898X9 Other specified disorders of bone, unspecified site: Secondary | ICD-10-CM | POA: Diagnosis present

## 2019-03-31 DIAGNOSIS — M7981 Nontraumatic hematoma of soft tissue: Secondary | ICD-10-CM | POA: Diagnosis not present

## 2019-03-31 DIAGNOSIS — E11649 Type 2 diabetes mellitus with hypoglycemia without coma: Secondary | ICD-10-CM | POA: Diagnosis not present

## 2019-03-31 DIAGNOSIS — G546 Phantom limb syndrome with pain: Secondary | ICD-10-CM | POA: Diagnosis not present

## 2019-03-31 DIAGNOSIS — E43 Unspecified severe protein-calorie malnutrition: Secondary | ICD-10-CM | POA: Diagnosis present

## 2019-03-31 DIAGNOSIS — K649 Unspecified hemorrhoids: Secondary | ICD-10-CM | POA: Diagnosis present

## 2019-03-31 DIAGNOSIS — Z6821 Body mass index (BMI) 21.0-21.9, adult: Secondary | ICD-10-CM | POA: Diagnosis not present

## 2019-03-31 DIAGNOSIS — D631 Anemia in chronic kidney disease: Secondary | ICD-10-CM | POA: Insufficient documentation

## 2019-03-31 DIAGNOSIS — R34 Anuria and oliguria: Secondary | ICD-10-CM | POA: Diagnosis present

## 2019-03-31 DIAGNOSIS — E1122 Type 2 diabetes mellitus with diabetic chronic kidney disease: Secondary | ICD-10-CM | POA: Diagnosis present

## 2019-03-31 DIAGNOSIS — Z4781 Encounter for orthopedic aftercare following surgical amputation: Secondary | ICD-10-CM | POA: Diagnosis present

## 2019-03-31 DIAGNOSIS — D638 Anemia in other chronic diseases classified elsewhere: Secondary | ICD-10-CM | POA: Diagnosis not present

## 2019-03-31 DIAGNOSIS — Z89511 Acquired absence of right leg below knee: Secondary | ICD-10-CM

## 2019-03-31 DIAGNOSIS — Z20822 Contact with and (suspected) exposure to covid-19: Secondary | ICD-10-CM | POA: Diagnosis present

## 2019-03-31 DIAGNOSIS — I120 Hypertensive chronic kidney disease with stage 5 chronic kidney disease or end stage renal disease: Secondary | ICD-10-CM | POA: Diagnosis present

## 2019-03-31 DIAGNOSIS — E1152 Type 2 diabetes mellitus with diabetic peripheral angiopathy with gangrene: Secondary | ICD-10-CM | POA: Diagnosis present

## 2019-03-31 DIAGNOSIS — R7309 Other abnormal glucose: Secondary | ICD-10-CM | POA: Diagnosis not present

## 2019-03-31 DIAGNOSIS — Z7982 Long term (current) use of aspirin: Secondary | ICD-10-CM

## 2019-03-31 DIAGNOSIS — E875 Hyperkalemia: Secondary | ICD-10-CM | POA: Diagnosis not present

## 2019-03-31 DIAGNOSIS — Z794 Long term (current) use of insulin: Secondary | ICD-10-CM | POA: Diagnosis not present

## 2019-03-31 DIAGNOSIS — N186 End stage renal disease: Secondary | ICD-10-CM

## 2019-03-31 DIAGNOSIS — D62 Acute posthemorrhagic anemia: Secondary | ICD-10-CM | POA: Diagnosis not present

## 2019-03-31 DIAGNOSIS — S88119A Complete traumatic amputation at level between knee and ankle, unspecified lower leg, initial encounter: Secondary | ICD-10-CM | POA: Diagnosis not present

## 2019-03-31 DIAGNOSIS — E1165 Type 2 diabetes mellitus with hyperglycemia: Secondary | ICD-10-CM | POA: Diagnosis present

## 2019-03-31 DIAGNOSIS — D649 Anemia, unspecified: Secondary | ICD-10-CM | POA: Insufficient documentation

## 2019-03-31 HISTORY — DX: Chronic kidney disease, unspecified: N18.9

## 2019-03-31 HISTORY — DX: Personal history of other (healed) physical injury and trauma: Z87.828

## 2019-03-31 HISTORY — DX: Personal history of diseases of the blood and blood-forming organs and certain disorders involving the immune mechanism: Z86.2

## 2019-03-31 HISTORY — DX: Unqualified visual loss, left eye, normal vision right eye: H54.62

## 2019-03-31 LAB — GLUCOSE, CAPILLARY
Glucose-Capillary: 123 mg/dL — ABNORMAL HIGH (ref 70–99)
Glucose-Capillary: 45 mg/dL — ABNORMAL LOW (ref 70–99)
Glucose-Capillary: 37 mg/dL — CL (ref 70–99)
Glucose-Capillary: 154 mg/dL — ABNORMAL HIGH (ref 70–99)
Glucose-Capillary: 177 mg/dL — ABNORMAL HIGH (ref 70–99)
Glucose-Capillary: 97 mg/dL (ref 70–99)
Glucose-Capillary: 175 mg/dL — ABNORMAL HIGH (ref 70–99)

## 2019-03-31 LAB — RENAL FUNCTION PANEL
Albumin: 1.8 g/dL — ABNORMAL LOW (ref 3.5–5.0)
Anion gap: 20 — ABNORMAL HIGH (ref 5–15)
BUN: 99 mg/dL — ABNORMAL HIGH (ref 6–20)
CO2: 25 mmol/L (ref 22–32)
Calcium: 8.7 mg/dL — ABNORMAL LOW (ref 8.9–10.3)
Chloride: 89 mmol/L — ABNORMAL LOW (ref 98–111)
Creatinine, Ser: 13.52 mg/dL — ABNORMAL HIGH (ref 0.61–1.24)
GFR calc Af Amer: 4 mL/min — ABNORMAL LOW (ref >60)
GFR calc non Af Amer: 4 mL/min — ABNORMAL LOW (ref >60)
Glucose, Bld: 44 mg/dL — CL (ref 70–99)
Phosphorus: 7.3 mg/dL — ABNORMAL HIGH (ref 2.5–4.6)
Potassium: 4.3 mmol/L (ref 3.5–5.1)
Sodium: 134 mmol/L — ABNORMAL LOW (ref 135–145)

## 2019-03-31 LAB — CBC
HCT: 26.1 % — ABNORMAL LOW (ref 39.0–52.0)
Hemoglobin: 8 g/dL — ABNORMAL LOW (ref 13.0–17.0)
MCH: 29.1 pg (ref 26.0–34.0)
MCHC: 30.7 g/dL (ref 30.0–36.0)
MCV: 94.9 fL (ref 80.0–100.0)
Platelets: 255 10*3/uL (ref 150–400)
RBC: 2.75 MIL/uL — ABNORMAL LOW (ref 4.22–5.81)
RDW: 13.5 % (ref 11.5–15.5)
WBC: 12.8 10*3/uL — ABNORMAL HIGH (ref 4.0–10.5)
nRBC: 0 % (ref 0.0–0.2)

## 2019-03-31 LAB — IRON AND TIBC
Iron: 30 ug/dL — ABNORMAL LOW (ref 45–182)
Saturation Ratios: 15 % — ABNORMAL LOW (ref 17.9–39.5)
TIBC: 200 ug/dL — ABNORMAL LOW (ref 250–450)
UIBC: 170 ug/dL

## 2019-03-31 LAB — FERRITIN: Ferritin: 1420 ng/mL — ABNORMAL HIGH (ref 24–336)

## 2019-03-31 MED ORDER — DOCUSATE SODIUM 100 MG PO CAPS: 100.0000 mg | ORAL_CAPSULE | Freq: Two times a day (BID) | ORAL | 0 refills | Status: DC

## 2019-03-31 MED ORDER — HYDROCODONE-ACETAMINOPHEN 5-325 MG PO TABS
1.0000 | ORAL_TABLET | ORAL | Status: DC | PRN
  Filled 2019-03-31: qty 1

## 2019-03-31 MED ORDER — GENTAMICIN SULFATE 0.1 % EX CREA
1.0000 "application " | TOPICAL_CREAM | Freq: Every day | CUTANEOUS | Status: DC
  Administered 2019-04-01 – 2019-04-12 (×16): 1 via TOPICAL
  Filled 2019-03-31: qty 15

## 2019-03-31 MED ORDER — ACETAMINOPHEN 325 MG PO TABS: 325.0000 mg | ORAL_TABLET | Freq: Four times a day (QID) | ORAL | Status: DC | PRN

## 2019-03-31 MED ORDER — BISACODYL 10 MG RE SUPP: 10.0000 mg | Freq: Every day | RECTAL | 0 refills | Status: DC | PRN

## 2019-03-31 MED ORDER — RENA-VITE PO TABS
1.0000 | ORAL_TABLET | Freq: Every day | ORAL | Status: DC
  Administered 2019-03-31 – 2019-04-23 (×24): 1 via ORAL
  Filled 2019-03-31 (×25): qty 1

## 2019-03-31 MED ORDER — PROCHLORPERAZINE MALEATE 5 MG PO TABS
5.0000 mg | ORAL_TABLET | Freq: Four times a day (QID) | ORAL | Status: DC | PRN
  Administered 2019-04-02 – 2019-04-21 (×4): 10 mg via ORAL
  Filled 2019-03-31 (×4): qty 2

## 2019-03-31 MED ORDER — CALCITRIOL 0.5 MCG PO CAPS
0.5000 ug | ORAL_CAPSULE | ORAL | Status: DC
  Administered 2019-04-01 – 2019-04-24 (×11): 0.5 ug via ORAL
  Filled 2019-03-31 (×11): qty 1

## 2019-03-31 MED ORDER — DIPHENHYDRAMINE HCL 12.5 MG/5ML PO ELIX: 12.5000 mg | ORAL_SOLUTION | Freq: Four times a day (QID) | ORAL | Status: DC | PRN

## 2019-03-31 MED ORDER — INSULIN ASPART 100 UNIT/ML ~~LOC~~ SOLN
0.0000 [IU] | Freq: Every day | SUBCUTANEOUS | Status: DC
  Administered 2019-04-02: 3 [IU] via SUBCUTANEOUS
  Administered 2019-04-03 – 2019-04-05 (×2): 2 [IU] via SUBCUTANEOUS

## 2019-03-31 MED ORDER — GENTAMICIN SULFATE 0.1 % EX CREA: 1.0000 "application " | TOPICAL_CREAM | Freq: Every day | CUTANEOUS | 0 refills | Status: DC

## 2019-03-31 MED ORDER — SODIUM BICARBONATE 650 MG PO TABS
1300.0000 mg | ORAL_TABLET | Freq: Three times a day (TID) | ORAL | Status: DC
  Administered 2019-04-01 – 2019-04-03 (×7): 1300 mg via ORAL
  Filled 2019-03-31 (×11): qty 2

## 2019-03-31 MED ORDER — PRO-STAT SUGAR FREE PO LIQD: 30.0000 mL | Freq: Three times a day (TID) | ORAL | 0 refills | Status: DC

## 2019-03-31 MED ORDER — TRAZODONE HCL 50 MG PO TABS
25.0000 mg | ORAL_TABLET | Freq: Every evening | ORAL | Status: DC | PRN
  Administered 2019-03-31 – 2019-04-22 (×14): 50 mg via ORAL
  Filled 2019-03-31 (×15): qty 1

## 2019-03-31 MED ORDER — GUAIFENESIN-DM 100-10 MG/5ML PO SYRP: 5.0000 mL | ORAL_SOLUTION | Freq: Four times a day (QID) | ORAL | Status: DC | PRN

## 2019-03-31 MED ORDER — HYDROCODONE-ACETAMINOPHEN 7.5-325 MG PO TABS
1.0000 | ORAL_TABLET | ORAL | Status: DC | PRN
  Administered 2019-04-01: 2 via ORAL
  Filled 2019-03-31: qty 2

## 2019-03-31 MED ORDER — GABAPENTIN 100 MG PO CAPS: 100.0000 mg | ORAL_CAPSULE | Freq: Three times a day (TID) | ORAL | Status: DC

## 2019-03-31 MED ORDER — CALCITRIOL 0.5 MCG PO CAPS: 0.5000 ug | ORAL_CAPSULE | ORAL | Status: DC

## 2019-03-31 MED ORDER — AMLODIPINE BESYLATE 10 MG PO TABS
10.0000 mg | ORAL_TABLET | Freq: Every day | ORAL | Status: DC
  Administered 2019-04-01 – 2019-04-18 (×18): 10 mg via ORAL
  Filled 2019-03-31 (×18): qty 1

## 2019-03-31 MED ORDER — BISACODYL 10 MG RE SUPP
10.0000 mg | Freq: Every day | RECTAL | Status: DC | PRN
  Administered 2019-04-06 – 2019-04-08 (×2): 10 mg via RECTAL
  Filled 2019-03-31 (×2): qty 1

## 2019-03-31 MED ORDER — SEVELAMER CARBONATE 800 MG PO TABS
2400.0000 mg | ORAL_TABLET | Freq: Three times a day (TID) | ORAL | Status: DC
  Administered 2019-03-31 – 2019-04-02 (×5): 2400 mg via ORAL
  Filled 2019-03-31 (×5): qty 3

## 2019-03-31 MED ORDER — HEPARIN SODIUM (PORCINE) 5000 UNIT/ML IJ SOLN
5000.0000 [IU] | Freq: Three times a day (TID) | INTRAMUSCULAR | Status: AC
  Administered 2019-03-31 – 2019-04-15 (×46): 5000 [IU] via SUBCUTANEOUS
  Filled 2019-03-31 (×46): qty 1

## 2019-03-31 MED ORDER — GABAPENTIN 100 MG PO CAPS
100.0000 mg | ORAL_CAPSULE | Freq: Three times a day (TID) | ORAL | Status: DC
  Administered 2019-03-31 – 2019-04-01 (×2): 100 mg via ORAL
  Filled 2019-03-31 (×2): qty 1

## 2019-03-31 MED ORDER — DOCUSATE SODIUM 100 MG PO CAPS
100.0000 mg | ORAL_CAPSULE | Freq: Two times a day (BID) | ORAL | Status: DC
  Administered 2019-03-31 – 2019-04-23 (×25): 100 mg via ORAL
  Filled 2019-03-31 (×45): qty 1

## 2019-03-31 MED ORDER — POLYETHYLENE GLYCOL 3350 17 G PO PACK
17.0000 g | PACK | Freq: Every day | ORAL | Status: DC | PRN
  Administered 2019-04-02 – 2019-04-17 (×5): 17 g via ORAL
  Filled 2019-03-31 (×6): qty 1

## 2019-03-31 MED ORDER — MILK AND MOLASSES ENEMA
1.0000 | Freq: Every day | RECTAL | Status: DC | PRN
  Filled 2019-03-31 (×3): qty 240

## 2019-03-31 MED ORDER — DARBEPOETIN ALFA 200 MCG/0.4ML IJ SOSY: 200.0000 ug | PREFILLED_SYRINGE | INTRAMUSCULAR | Status: DC

## 2019-03-31 MED ORDER — ALUMINUM HYDROXIDE GEL 320 MG/5ML PO SUSP
10.0000 mL | Freq: Four times a day (QID) | ORAL | Status: DC | PRN
  Filled 2019-03-31: qty 30

## 2019-03-31 MED ORDER — SEVELAMER CARBONATE 800 MG PO TABS
800.0000 mg | ORAL_TABLET | Freq: Two times a day (BID) | ORAL | Status: DC | PRN
  Filled 2019-03-31 (×2): qty 1

## 2019-03-31 MED ORDER — PROCHLORPERAZINE 25 MG RE SUPP: 12.5000 mg | Freq: Four times a day (QID) | RECTAL | Status: DC | PRN

## 2019-03-31 MED ORDER — PRO-STAT SUGAR FREE PO LIQD
30.0000 mL | Freq: Three times a day (TID) | ORAL | Status: DC
  Administered 2019-03-31 – 2019-04-24 (×67): 30 mL via ORAL
  Filled 2019-03-31 (×68): qty 30

## 2019-03-31 MED ORDER — METHOCARBAMOL 500 MG PO TABS: 500.0000 mg | ORAL_TABLET | Freq: Four times a day (QID) | ORAL | Status: DC | PRN

## 2019-03-31 MED ORDER — INSULIN ASPART 100 UNIT/ML ~~LOC~~ SOLN: 0.0000 [IU] | Freq: Three times a day (TID) | SUBCUTANEOUS | 11 refills | Status: DC

## 2019-03-31 MED ORDER — RENA-VITE PO TABS: 1.0000 | ORAL_TABLET | Freq: Every day | ORAL | 0 refills | Status: DC

## 2019-03-31 MED ORDER — SODIUM BICARBONATE 650 MG PO TABS: 1300.0000 mg | ORAL_TABLET | Freq: Three times a day (TID) | ORAL | Status: DC

## 2019-03-31 MED ORDER — HYDROCODONE-ACETAMINOPHEN 5-325 MG PO TABS: 1.0000 | ORAL_TABLET | ORAL | 0 refills | Status: DC | PRN

## 2019-03-31 MED ORDER — PROCHLORPERAZINE EDISYLATE 10 MG/2ML IJ SOLN: 5.0000 mg | Freq: Four times a day (QID) | INTRAMUSCULAR | Status: DC | PRN

## 2019-03-31 MED ORDER — ATORVASTATIN CALCIUM 10 MG PO TABS
20.0000 mg | ORAL_TABLET | Freq: Every day | ORAL | Status: DC
  Administered 2019-03-31 – 2019-04-23 (×24): 20 mg via ORAL
  Filled 2019-03-31 (×25): qty 2

## 2019-03-31 MED ORDER — INSULIN GLARGINE 100 UNIT/ML ~~LOC~~ SOLN
20.0000 [IU] | Freq: Once | SUBCUTANEOUS | Status: AC
  Administered 2019-03-31: 20 [IU] via SUBCUTANEOUS
  Filled 2019-03-31: qty 0.2

## 2019-03-31 MED ORDER — INSULIN GLARGINE 100 UNIT/ML ~~LOC~~ SOLN
20.0000 [IU] | Freq: Every day | SUBCUTANEOUS | Status: DC
  Administered 2019-04-01 – 2019-04-05 (×5): 20 [IU] via SUBCUTANEOUS
  Filled 2019-03-31 (×5): qty 0.2

## 2019-03-31 MED ORDER — POLYETHYLENE GLYCOL 3350 17 G PO PACK: 17.0000 g | PACK | Freq: Every day | ORAL | Status: DC | PRN

## 2019-03-31 MED ORDER — INSULIN GLARGINE 100 UNIT/ML ~~LOC~~ SOLN: 20.0000 [IU] | Freq: Every day | SUBCUTANEOUS | Status: DC

## 2019-03-31 MED ORDER — CLONIDINE HCL 0.1 MG PO TABS
0.1000 mg | ORAL_TABLET | Freq: Every day | ORAL | Status: DC
  Administered 2019-03-31 – 2019-04-21 (×22): 0.1 mg via ORAL
  Filled 2019-03-31 (×22): qty 1

## 2019-03-31 MED ORDER — DARBEPOETIN ALFA 200 MCG/0.4ML IJ SOSY
200.0000 ug | PREFILLED_SYRINGE | INTRAMUSCULAR | Status: DC
  Filled 2019-03-31: qty 0.4

## 2019-03-31 MED ORDER — DELFLEX-LC/2.5% DEXTROSE 394 MOSM/L IP SOLN: INTRAPERITONEAL | Status: DC

## 2019-03-31 MED ORDER — ACETAMINOPHEN 325 MG PO TABS
325.0000 mg | ORAL_TABLET | ORAL | Status: DC | PRN
  Administered 2019-04-01 – 2019-04-02 (×5): 650 mg via ORAL
  Administered 2019-04-03: 325 mg via ORAL
  Administered 2019-04-03 – 2019-04-23 (×8): 650 mg via ORAL
  Filled 2019-03-31 (×15): qty 2

## 2019-03-31 MED ORDER — SEVELAMER CARBONATE 800 MG PO TABS: 2400.0000 mg | ORAL_TABLET | Freq: Three times a day (TID) | ORAL | Status: DC

## 2019-03-31 MED ORDER — INSULIN GLARGINE 100 UNIT/ML ~~LOC~~ SOLN: 20.0000 [IU] | Freq: Every day | SUBCUTANEOUS | 11 refills | Status: DC

## 2019-03-31 MED ORDER — HEPARIN 1000 UNIT/ML FOR PERITONEAL DIALYSIS
INTRAPERITONEAL | Status: DC | PRN
  Filled 2019-03-31 (×2): qty 5000

## 2019-03-31 NOTE — Care Management Important Message (Signed)
Important Message  Patient Details  Name: Jorge [name] MRN: [id number] Date of Birth: Sep 22, 1970   Medicare Important Message Given:  Yes     Memory Argue 03/31/2019, 1:19 PM

## 2019-03-31 NOTE — Progress Notes (Signed)
Hypoglycemic Event  CBG: 45  Treatment: juice and graham crackers  Symptoms: VSS and no symptoms to   Follow-up CBG: Time:0850 CBG Result:37  Treatment: sprite and breakfast  Follow-up CBG time: 0933  CBG Result:154  Possible Reasons for Event:pt did not eat evening snack like he does at home before bedtime  Comments/MD notified:pt remained asymptomatic. Will continue to monitor and page provider.     [place]

## 2019-03-31 NOTE — H&P (Addendum)
Physical Medicine and Rehabilitation Admission H&P        Chief Complaint  Patient presents with  . Functional decline due to B-BKA due to wound infections.       HPI: Jorge [name] is a 49 year old male with history of T2DM, HTN, ESRD--PD who was admitted on 03/19/2019 with ulcers bilateral feet with gangrenous changes.  He was found to be septic, had acute on chronic anemia and x-rays revealed osteomyelitis.  He was started on broad-spectrum antibiotics.    He had drop in hemoglobin to 5.4 on 3/12 and was transfused with 2 units PRBCs.  Nephrology following for management of CCPD.  He underwent bilateral BKA by Dr. Sharol Given on 03/25/2019.  To be NWB-BLE.  Pain control is improving with improvement in activity tolerance.  Therapy ongoing and CIR was recommended due to functional decline     Pt reports makes urine still- ~2x/day- on PD nightly- pain meds are working well, but needs to call for more in a minute.    BG was 39 this AM- Lantus was decreased; notes no phantom pain, but has phantom ITCHING.   Review of Systems  Constitutional: Negative for diaphoresis and fever.  Eyes: Negative for double vision and photophobia.  Respiratory: Negative.   Cardiovascular: Negative for chest pain.  Gastrointestinal: Positive for constipation. Negative for abdominal pain and diarrhea.  Musculoskeletal: Positive for joint pain and myalgias.  Neurological: Positive for weakness. Negative for tingling, tremors and speech change.  Psychiatric/Behavioral: The patient is nervous/anxious.   All other systems reviewed and are negative.           Past Medical History:  Diagnosis Date  . Diabetes mellitus without complication ([place])    . Hypertension    . Peritoneal dialysis status ([place])    . Renal disorder             Past Surgical History:  Procedure Laterality Date  . AMPUTATION Bilateral 03/25/2019    Procedure: BILATERAL BELOW KNEE AMPUTATION;  Surgeon: Newt Minion, MD;  Location: [facility];  Service: Orthopedics;  Laterality: Bilateral;  . FOOT SURGERY Left    . KNEE SURGERY Left        History reviewed. No pertinent family history.      Social History:  reports that he has never smoked. He has never used smokeless tobacco. He reports that he does not drink alcohol or use drugs.      Allergies: No Known Allergies            Medications Prior to Admission  Medication Sig Dispense Refill  . amLODipine (NORVASC) 10 MG tablet Take 10 mg by mouth daily.      Marland Kitchen aspirin EC 81 MG tablet Take 81 mg by mouth daily.      Marland Kitchen atorvastatin (LIPITOR) 20 MG tablet Take 20 mg by mouth daily.      . cloNIDine (CATAPRES) 0.1 MG tablet Take 0.1 mg by mouth at bedtime.      . ergocalciferol (VITAMIN D2) 1.25 MG (50000 UT) capsule Take 50,000 Units by mouth once a week.      . furosemide (LASIX) 80 MG tablet Take 80 mg by mouth daily.      . hydrALAZINE (APRESOLINE) 100 MG tablet Take 150 mg by mouth 2 (two) times daily.      . insulin glargine (LANTUS) 100 UNIT/ML injection Inject 10 Units into the skin at bedtime.      . insulin regular (  NOVOLIN R,HUMULIN R) 100 units/mL injection Inject 3-13 Units into the skin 3 (three) times daily as needed for high blood sugar (per sliding scale).      Marland Kitchen metoCLOPramide (REGLAN) 5 MG tablet Take 5 mg by mouth every 6 (six) hours as needed for nausea.      Marland Kitchen RENVELA 800 MG tablet Take 2,400 mg by mouth 3 (three) times daily.   6  . sodium bicarbonate 650 MG tablet Take 650 mg by mouth 3 (three) times daily.      . bisacodyl (DULCOLAX) 10 MG suppository Place 1 suppository (10 mg total) rectally as needed for moderate constipation. (Patient not taking: Reported on 03/19/2019) 12 suppository 0  . docusate sodium (COLACE) 100 MG capsule Take 1 capsule (100 mg total) by mouth every 12 (twelve) hours. (Patient not taking: Reported on 03/19/2019) 60 capsule 0  . ondansetron (ZOFRAN) 4 MG tablet Take 1 tablet (4 mg total) by mouth every 6 (six) hours. (Patient  not taking: Reported on 03/19/2019) 12 tablet 0      Drug Regimen Review  Drug regimen was reviewed and remains appropriate with no significant issues identified   Home: Home Living Family/patient expects to be discharged to:: Private residence Living Arrangements: Children, Other relatives Available Help at Discharge: Family, Available 24 hours/day Type of Home: House Home Access: Level entry Home Layout: Two level, Other (Comment) Bathroom Shower/Tub: Chiropodist: Standard Home Equipment: Toilet riser   Functional History: Prior Function Level of Independence: Independent   Functional Status:  Mobility: Bed Mobility Overal bed mobility: Needs Assistance Bed Mobility: Supine to Sit Rolling: Min guard Supine to sit: Min guard Sit to supine: Min guard General bed mobility comments: pt sitting in recliner upon arrival Transfers Overall transfer level: Needs assistance Equipment used: Rolling walker (2 wheeled) Transfers: Development worker, community transfers: +2 physical assistance, Mod assist  Lateral/Scoot Transfers: Min assist General transfer comment: deferred this session   ADL: ADL Overall ADL's : Needs assistance/impaired Eating/Feeding: Independent, Sitting Grooming: Set up, Sitting Upper Body Bathing: Supervision/ safety, Set up, Sitting Lower Body Bathing: Sitting/lateral leans, Moderate assistance Upper Body Dressing : Set up, Sitting Lower Body Dressing: Sitting/lateral leans, Moderate assistance Toilet Transfer: Minimal assistance, +2 for physical assistance, +2 for safety/equipment, Anterior/posterior, Cueing for safety Toileting- Clothing Manipulation and Hygiene: Minimal assistance, Sitting/lateral lean Functional mobility during ADLs: Minimal assistance, +2 for physical assistance, Cueing for safety General ADL Comments: session focused on UE HEP and general core strengthenign   Cognition: Cognition Overall  Cognitive Status: Within Functional Limits for tasks assessed Orientation Level: Oriented X4 Cognition Arousal/Alertness: Awake/alert Behavior During Therapy: WFL for tasks assessed/performed Overall Cognitive Status: Within Functional Limits for tasks assessed General Comments: pt is highly motivated to work with therapy and states "ill do whatever I have to to get stronger";educated pt on grieving process associated with amputations and educated him on desensitization strategies associated with phantom limb sensation and phantom limb pain   Physical Exam: Blood pressure (!) 120/45, pulse 72, temperature 98.7 F (37.1 C), temperature source Oral, resp. rate 16, height 5\' 11"  (1.803 m), weight 71 kg, SpO2 97 %. Physical Exam  Nursing note and vitals reviewed. Constitutional: He is oriented to person, place, and time. He appears well-developed. No distress.  Younger male, in tank top sitting on bedpan; daughter in room, NAD  HENT:  Head: Normocephalic and atraumatic.  Nose: Nose normal.  Mouth/Throat: Oropharynx is clear and moist. No oropharyngeal exudate.  No facial droop; tongue  midline   Eyes: Conjunctivae are normal.  EOMI B/L; no nystagmus; no drainage from eyes B/L  Neck: No tracheal deviation present.  Cardiovascular:  RRR; no JVD  Respiratory: No stridor.  CTA B/L- good air movement-   GI:  Soft, NT; ND< (+)BS hyperactive- trying to have BM  Genitourinary:    Penis normal.   Musculoskeletal:     Cervical back: Normal range of motion and neck supple.     Comments: 5/5 in UEs- in deltoids, biceps, triceps, WE, grip , but finger abduction was 4/5 on R and 3/5 on L Pt was surprised about that.  Wasn't able to test LEs since on bedpan, but in wound VACs B/L and shrinkers over wound VACs  Neurological: He is oriented to person, place, and time. Coordination normal.  Intact sensation to light touch in UEs and LEs- x4 extremities  Skin:  No skin breakdown seen, but unable to  assess backside since on bedpan  Psychiatric: He has a normal mood and affect. His behavior is normal.      Lab Results Last 48 Hours        Results for orders placed or performed during the hospital encounter of 03/19/19 (from the past 48 hour(s))  Glucose, capillary     Status: Abnormal    Collection Time: 03/29/19 11:55 AM  Result Value Ref Range    Glucose-Capillary 39 (LL) 70 - 99 mg/dL      Comment: Glucose reference range applies only to samples taken after fasting for at least 8 hours.    Comment 1 Notify RN    Glucose, capillary     Status: None    Collection Time: 03/29/19 12:19 PM  Result Value Ref Range    Glucose-Capillary 99 70 - 99 mg/dL      Comment: Glucose reference range applies only to samples taken after fasting for at least 8 hours.  Glucose, capillary     Status: Abnormal    Collection Time: 03/29/19  3:23 PM  Result Value Ref Range    Glucose-Capillary 154 (H) 70 - 99 mg/dL      Comment: Glucose reference range applies only to samples taken after fasting for at least 8 hours.  Glucose, capillary     Status: Abnormal    Collection Time: 03/29/19  9:06 PM  Result Value Ref Range    Glucose-Capillary 201 (H) 70 - 99 mg/dL      Comment: Glucose reference range applies only to samples taken after fasting for at least 8 hours.    Comment 1 Document in Chart    Glucose, capillary     Status: Abnormal    Collection Time: 03/30/19  6:56 AM  Result Value Ref Range    Glucose-Capillary 149 (H) 70 - 99 mg/dL      Comment: Glucose reference range applies only to samples taken after fasting for at least 8 hours.    Comment 1 Document in Chart    Glucose, capillary     Status: Abnormal    Collection Time: 03/30/19 11:10 AM  Result Value Ref Range    Glucose-Capillary 120 (H) 70 - 99 mg/dL      Comment: Glucose reference range applies only to samples taken after fasting for at least 8 hours.  Glucose, capillary     Status: Abnormal    Collection Time: 03/30/19  4:07  PM  Result Value Ref Range    Glucose-Capillary 131 (H) 70 - 99 mg/dL  Comment: Glucose reference range applies only to samples taken after fasting for at least 8 hours.  Glucose, capillary     Status: Abnormal    Collection Time: 03/30/19  7:54 PM  Result Value Ref Range    Glucose-Capillary 67 (L) 70 - 99 mg/dL      Comment: Glucose reference range applies only to samples taken after fasting for at least 8 hours.  Glucose, capillary     Status: None    Collection Time: 03/30/19  8:35 PM  Result Value Ref Range    Glucose-Capillary 98 70 - 99 mg/dL      Comment: Glucose reference range applies only to samples taken after fasting for at least 8 hours.  Glucose, capillary     Status: Abnormal    Collection Time: 03/30/19 11:30 PM  Result Value Ref Range    Glucose-Capillary 126 (H) 70 - 99 mg/dL      Comment: Glucose reference range applies only to samples taken after fasting for at least 8 hours.  Glucose, capillary     Status: Abnormal    Collection Time: 03/31/19  3:51 AM  Result Value Ref Range    Glucose-Capillary 123 (H) 70 - 99 mg/dL      Comment: Glucose reference range applies only to samples taken after fasting for at least 8 hours.  Glucose, capillary     Status: Abnormal    Collection Time: 03/31/19  8:00 AM  Result Value Ref Range    Glucose-Capillary 45 (L) 70 - 99 mg/dL      Comment: Glucose reference range applies only to samples taken after fasting for at least 8 hours.  CBC     Status: Abnormal    Collection Time: 03/31/19  8:36 AM  Result Value Ref Range    WBC 12.8 (H) 4.0 - 10.5 K/uL    RBC 2.75 (L) 4.22 - 5.81 MIL/uL    Hemoglobin 8.0 (L) 13.0 - 17.0 g/dL    HCT 26.1 (L) 39.0 - 52.0 %    MCV 94.9 80.0 - 100.0 fL    MCH 29.1 26.0 - 34.0 pg    MCHC 30.7 30.0 - 36.0 g/dL    RDW 13.5 11.5 - 15.5 %    Platelets 255 150 - 400 K/uL    nRBC 0.0 0.0 - 0.2 %      Comment: Performed at [facility], [place] [address] [place], [place] [address]  Renal  function panel     Status: Abnormal    Collection Time: 03/31/19  8:36 AM  Result Value Ref Range    Sodium 134 (L) 135 - 145 mmol/L    Potassium 4.3 3.5 - 5.1 mmol/L    Chloride 89 (L) 98 - 111 mmol/L    CO2 25 22 - 32 mmol/L    Glucose, Bld 44 (LL) 70 - 99 mg/dL      Comment: Glucose reference range applies only to samples taken after fasting for at least 8 hours. CRITICAL RESULT CALLED TO, READ BACK BY AND VERIFIED WITH: M PAYNE,RN 03/31/2019 [id number]      BUN 99 (H) 6 - 20 mg/dL    Creatinine, Ser 13.52 (H) 0.61 - 1.24 mg/dL    Calcium 8.7 (L) 8.9 - 10.3 mg/dL    Phosphorus 7.3 (H) 2.5 - 4.6 mg/dL    Albumin 1.8 (L) 3.5 - 5.0 g/dL    GFR calc non Af Amer 4 (L) >60 mL/min    GFR calc Af  Amer 4 (L) >60 mL/min    Anion gap 20 (H) 5 - 15      Comment: Performed at [facility], [place] [address] [place], [place] [address]  Glucose, capillary     Status: Abnormal    Collection Time: 03/31/19  8:44 AM  Result Value Ref Range    Glucose-Capillary 37 (LL) 70 - 99 mg/dL      Comment: Glucose reference range applies only to samples taken after fasting for at least 8 hours.    Comment 1 Notify RN    Glucose, capillary     Status: Abnormal    Collection Time: 03/31/19  9:33 AM  Result Value Ref Range    Glucose-Capillary 154 (H) 70 - 99 mg/dL      Comment: Glucose reference range applies only to samples taken after fasting for at least 8 hours.      Imaging Results (Last 48 hours)  No results found.           Medical Problem List and Plan: 1.  Impaired function/ADLs/mobility secondary to B/L BKAs due to osteomyelitis and gangrene- s/p IV ABX- completed 3/18             -patient may not shower until wound VAC are removed; then LEs need to be covered             -ELOS/Goals: 10-14 days; supervision to Mod I 2.  Antithrombotics: -DVT/anticoagulation:  Pharmaceutical: Heparin due to PD/ESRD             -antiplatelet therapy:  3. Pain Management: Hydrocodone as needed- per  pt, pain meds are suitable/working 4. Mood: LCSW to follow for evaluation and support             -antipsychotic agents:  5. Neuropsych: This patient is capable of making decisions on his own behalf. 6. Skin/Wound Care:  7. Fluids/Electrolytes/Nutrition: Strict I's and O's.  Monitor weights daily after PD. 8.  ESRD: Continue CCPD daily. Is oliguric, not anuric- voids ~2x/day 9.  Anemia of chronic disease: On ESA weekly.  H&H stable.  10.  Nutrition: Severe protein malnutrition-   Renal/carb modified diet to continue with prostat due to low calorie/protein malnutrition. 11. HTN: Monitor blood pressures twice daily.  Off hydralazine and furosemide at this time. 12.  T2DM: A1c 7.8- Continue Lantus daily--d/c meal coverage due to hypoglycemic episodes. Monitor blood sugars AC at bedtime for now due to hypoglycemia. 13. B/L BKAs- surgery done 3/17- so should be able to remove on 3/24 unless something occurs.  14. Phantom itching- might benefit from nerve pain meds- low dose.      Bary Leriche, PA-C 03/31/2019    I have personally performed a face to face diagnostic evaluation of this patient and formulated the key components of the plan.  Additionally, I have personally reviewed laboratory data, imaging studies, as well as relevant notes and concur with the physician assistant's documentation above.   The patient's status has not changed from the original H&P.  Any changes in documentation from the acute care chart have been noted above.

## 2019-03-31 NOTE — Discharge Summary (Signed)
Physician Discharge Summary  Jorge [name] TAV:[id number] DOB: September 06, 1970 DOA: 03/19/2019  PCP: Patient, No Pcp Per  Admit date: 03/19/2019 Discharge date: 03/31/2019  Admitted From: Home Disposition:  [facility] inpatient rehabilitation   Recommendations for Outpatient Follow-up:  1. Follow up with PCP in 1-2 weeks 2. Follow-up with Orthopedics, Dr. Sharol Given 1 week 3. Please obtain CBC in one week to assess hemoglobin level, BMP per nephrology 4.  discontinued home hydralazine and furosemide.  Home Health: None Equipment/Devices: Wound Vac Bilateral Lower extremites  Discharge Condition: Stable CODE STATUS: Full Code Diet recommendation: Renal diet  History of present illness:  Jorge [name] is a 49 y.o.malewith medical history significant forESRD on peritoneal dialysis, hypertension, insulin-dependent diabetes mellitus, and anemia, now presenting to emergency department for evaluation of swelling, drainage, and following up from his right foot. Patient reports that he has had anulcer at the plantar aspect of his left foot for at least a couple months and developed ulceration involving the right foot approximately 2 weeks ago. The right foot has developed some swelling with foulodor and drainage over the past week or so.He denies any fevers or chills, denies pain, denies any shortness of breath, cough, or sore throat. Reports that he recently went to the area, is established with a nephrologist, but does not have a PCP yet.  Upon arrival to the ED, patient is found to be afebrile, saturating well on room air, and stable pressure. Chemistry panel notable for glucose of 321, BUN 78, bicarbonate 13, anion gap 20, and albumin 2.1. CBC features a leukocytosis to 16,200 and normocytic anemia: 7.7, down from 10.3 last month. Lactic acid was elevated to 2.4 initially. Fecal occult blood testing was negative. Blood cultures were collected in the emergency department, broad-spectrum antibiotics  were started, and orthopedic surgeon and nephrologist were consulted by the ED physician.   Hospital course:  Sepsis secondary to osteomyelitisGangrene bilateral feet, POA Patient presenting with ulcerations to bilateral feet with malodorous discharge.  X-ray left foot with soft tissue defect adjacent plantar calcaneus consistent with ulceration.  Right foot x-ray with soft tissue gas projected over the 1/2 metatarsophalangeal joints suspicion for gangrene, erosions/ bony destruction 2nd proximal phalanx and likely great toe proximal phalanx concerning for osteomyelitis.  CRP elevated 10.1 and ESR greater than 140.  Patient underwent bilateral BKA by Dr. Sharol Given on 03/25/2019. Antibiotics with vancomycin and Zosyn discontinued 24 hours postoperatively. Continue wound VAC;  maintain 1 week following discharge per ortho. Nonweightbearing bilateral lower extremities.  Continue Norco and gabapentin for pain control.  Robaxin p.r.n. for muscle spasms.  Discharging to [facility] inpatient rehabilitation today.  ESRD on peritoneal dialysis Continue peritoneal dialysis per nephrology. Continue sevelamer 2428m PO TIDAC, sodium bircarb 13080mPO TID.  Anemia of chronic Disease/acute postoperative blood loss anemia Iron level 38, TIBC 162, ferritin 1,688, and folate 34.9; consistent with anemia of chronic disease.   Hemoglobin trended down to a low 5.4, received 3 units PRBC on 03/20/2019.  Hemoglobin remained stable 7.5.  Recommend repeat CBC in 1 week. Continue Aricept per nephrology.  Essential hypertension BP 113/53 this am. Continue amlodipine 10 mg p.o. daily and clonidine 0.34m23mo qHS.   Discontinued home hydralazine.  HLD:  Continue atorvastatin 20 mg p.o. daily  Insulin-dependent diabetes mellitus Hemoglobin A1c 7.8, not well controlled.  Home regimen includes Lantus 10 units subcutaneously daily, Novolin insulin sliding scale.   Hypoglycemic event this morning, asymptomatic.  Will decrease Lantus to  20 units subcutaneously daily.  Continue insulin  sliding scale for coverage.  Continue close monitoring of glucose adjust accordingly.   Needs aggressive glucose control for wound healing.  Discharge Diagnoses:  Active Problems:   ESRD (end stage renal disease) ([place])   Hypertension   Insulin-requiring or dependent type II diabetes mellitus ([place])   Normocytic anemia   ESRD on peritoneal dialysis ([place])   Malnutrition of moderate degree    Discharge Instructions  Discharge Instructions    Diet - low sodium heart healthy   Complete by: As directed    Increase activity slowly   Complete by: As directed      Allergies as of 03/31/2019   No Known Allergies     Medication List    STOP taking these medications   furosemide 80 MG tablet Commonly known as: LASIX   hydrALAZINE 100 MG tablet Commonly known as: APRESOLINE   insulin regular 100 units/mL injection Commonly known as: NOVOLIN R   ondansetron 4 MG tablet Commonly known as: ZOFRAN     TAKE these medications   acetaminophen 325 MG tablet Commonly known as: TYLENOL Take 1-2 tablets (325-650 mg total) by mouth every 6 (six) hours as needed for mild pain (pain score 1-3 or temp > 100.5).   amLODipine 10 MG tablet Commonly known as: NORVASC Take 10 mg by mouth daily.   aspirin EC 81 MG tablet Take 81 mg by mouth daily.   atorvastatin 20 MG tablet Commonly known as: LIPITOR Take 20 mg by mouth daily.   bisacodyl 10 MG suppository Commonly known as: DULCOLAX Place 1 suppository (10 mg total) rectally daily as needed for moderate constipation. What changed: when to take this   calcitRIOL 0.5 MCG capsule Commonly known as: ROCALTROL Take 1 capsule (0.5 mcg total) by mouth 3 (three) times a week. Start taking on: April 01, 2019   cloNIDine 0.1 MG tablet Commonly known as: CATAPRES Take 0.1 mg by mouth at bedtime.   Darbepoetin Alfa 200 MCG/0.4ML Sosy injection Commonly known as: ARANESP Inject 0.4 mLs (200  mcg total) into the skin every Wednesday at 6 PM. Start taking on: April 01, 2019   docusate sodium 100 MG capsule Commonly known as: COLACE Take 1 capsule (100 mg total) by mouth 2 (two) times daily. What changed: when to take this   ergocalciferol 1.25 MG (50000 UT) capsule Commonly known as: VITAMIN D2 Take 50,000 Units by mouth once a week.   feeding supplement (PRO-STAT SUGAR FREE 64) Liqd Take 30 mLs by mouth 3 (three) times daily between meals.   gabapentin 100 MG capsule Commonly known as: NEURONTIN Take 1 capsule (100 mg total) by mouth 3 (three) times daily.   gentamicin cream 0.1 % Commonly known as: GARAMYCIN Apply 1 application topically daily. Start taking on: April 01, 2019   HYDROcodone-acetaminophen 5-325 MG tablet Commonly known as: NORCO/VICODIN Take 1-2 tablets by mouth every 4 (four) hours as needed for moderate pain (pain score 4-6).   insulin aspart 100 UNIT/ML injection Commonly known as: novoLOG Inject 0-15 Units into the skin 3 (three) times daily with meals.   insulin glargine 100 UNIT/ML injection Commonly known as: LANTUS Inject 0.2 mLs (20 Units total) into the skin daily. Start taking on: April 01, 2019 What changed:   how much to take  when to take this   methocarbamol 500 MG tablet Commonly known as: ROBAXIN Take 1 tablet (500 mg total) by mouth every 6 (six) hours as needed for muscle spasms.   metoCLOPramide 5 MG tablet Commonly  known as: REGLAN Take 5 mg by mouth every 6 (six) hours as needed for nausea.   multivitamin Tabs tablet Take 1 tablet by mouth at bedtime.   sevelamer carbonate 800 MG tablet Commonly known as: Renvela Take 3 tablets (2,400 mg total) by mouth 3 (three) times daily with meals. What changed: when to take this   sodium bicarbonate 650 MG tablet Take 2 tablets (1,300 mg total) by mouth 3 (three) times daily. What changed: how much to take      [place] Follow up on 04/14/2019.   Why: 2:30 pm , Dr. Georgiann Hahn Contact information: [place] [address] [phone]       Newt Minion, MD In 1 week.   Specialty: Orthopedic Surgery Contact information: [address] [place] [address] [phone]          No Known Allergies  Consultations:  Orthopedics, Dr. Sharol Given   Nephrology   Procedures/Studies: DG Foot Complete Left  Result Date: 03/19/2019 CLINICAL DATA:  Infection. History of diabetes. EXAM: LEFT FOOT - COMPLETE 3+ VIEW COMPARISON:  None. FINDINGS: Soft tissue defect subjacent to the plantar calcaneus. No associated bony destruction or periosteal reaction. No other sites suspicious for osteomyelitis. Hammertoe deformity of the digits limits assessment. No evidence of fracture. Advanced vascular calcifications. IMPRESSION: Soft tissue defect subjacent to the plantar calcaneus consistent with ulceration. No radiographic evidence of osteomyelitis. Electronically Signed   By: Keith Rake M.D.   On: 03/19/2019 20:45   DG Foot Complete Right  Result Date: 03/19/2019 CLINICAL DATA:  Infection. History of diabetes. EXAM: RIGHT FOOT COMPLETE - 3+ VIEW COMPARISON:  None. FINDINGS: Mild bowel gas projects over the first and second metatarsal phalangeal joints suspicious for gangrene. Erosions in bony destruction about the second toe proximal phalanx. Suspected erosion about the great toe proximal phalanx, partially obscured by overlying air lucencies. Decreased density of the third metatarsal head which is nonspecific. Deformity of the distal fifth metatarsal may be postsurgical or remote injury/infection. Advanced vascular calcifications. No radiopaque foreign bodies. IMPRESSION: 1. Soft tissue gas projects over the first and second metatarsophalangeal joints suspicious for gangrene. 2. Erosions and bony destruction about the second toe proximal phalanx and possibly great toe  proximal phalanx, suspicious for osteomyelitis. Nonspecific decreased density of the medial third metatarsal head. 3. Advanced vascular calcifications. Electronically Signed   By: Keith Rake M.D.   On: 03/19/2019 20:44   VAS [place] ABI WITH/WO TBI  Result Date: 03/21/2019 LOWER EXTREMITY DOPPLER STUDY Indications: Gangrene. High Risk Factors: Hypertension, Diabetes.  Limitations: Today's exam was limited due to an open wound. Comparison Study: No prior study Performing Technologist: Maudry Mayhew MHA, RVT, RDCS, RDMS  Examination Guidelines: A complete evaluation includes at minimum, Doppler waveform signals and systolic blood pressure reading at the level of bilateral brachial, anterior tibial, and posterior tibial arteries, when vessel segments are accessible. Bilateral testing is considered an integral part of a complete examination. Photoelectric Plethysmograph (PPG) waveforms and toe systolic pressure readings are included as required and additional duplex testing as needed. Limited examinations for reoccurring indications may be performed as noted.  ABI Findings: +--------+------------------+-----+----------+--------+ Right   Rt Pressure (mmHg)IndexWaveform  Comment  +--------+------------------+-----+----------+--------+ [id number]                    triphasic          +--------+------------------+-----+----------+--------+ PTA     255  1.00 monophasic         +--------+------------------+-----+----------+--------+ DP      255               1.00 monophasic         +--------+------------------+-----+----------+--------+ +--------+------------------+-----+----------+-------+ Left    Lt Pressure (mmHg)IndexWaveform  Comment +--------+------------------+-----+----------+-------+ [id number]                    triphasic         +--------+------------------+-----+----------+-------+ PTA                            absent             +--------+------------------+-----+----------+-------+ DP      254               1.00 monophasic        +--------+------------------+-----+----------+-------+  Summary: Right: Resting right ankle-brachial index indicates noncompressible right lower extremity arteries. Left: Resting left ankle-brachial index indicates noncompressible left lower extremity arteries.  *See table(s) above for measurements and observations.  Electronically signed by Harold Barban MD on 03/21/2019 at 7:08:18 PM.   Final       Subjective:    Patient seen examined bedside this morning, resting comfortably.  Bed ready at inpatient rehab today, ready for discharge.  No other complaints or concerns at this time.  Denies headache, no fever/ chills/ night sweats, no nausea/vomiting / diarrhea, no chest pain, palpitations, no shortness of breath.  No acute events overnight per nursing staff.   Discharge Exam: Vitals:   03/31/19 0348 03/31/19 0719  BP: (!) 113/53 (!) 120/45  Pulse: 76 72  Resp: 19 16  Temp: 98.4 F (36.9 C) 98.7 F (37.1 C)  SpO2: 98% 97%   Vitals:   03/30/19 1941 03/30/19 2300 03/31/19 0348 03/31/19 0719  BP: (!) 105/51  (!) 113/53 (!) 120/45  Pulse: 68  76 72  Resp: 18  19 16   Temp: 98.3 F (36.8 C)  98.4 F (36.9 C) 98.7 F (37.1 C)  TempSrc: Oral  Oral Oral  SpO2: 98% 98% 98% 97%  Weight:      Height:        General: Pt is alert, awake, not in acute distress Cardiovascular: RRR, S1/S2 +, no rubs, no gallops Respiratory: CTA bilaterally, no wheezing, no rhonchi Abdominal: Soft, NT, ND, bowel sounds + Extremities: no edema, no cyanosis,   Bilateral BKA noted with stump protectors and wound vacs in place    The results of significant diagnostics from this hospitalization (including imaging, microbiology, ancillary and laboratory) are listed below for reference.     Microbiology: No results found for this or any previous visit (from the past 240 hour(s)).   Labs: BNP (last 3  results) No results for input(s): BNP in the last 8760 hours. Basic Metabolic Panel: Recent Labs  Lab 03/25/19 0201 03/26/19 0748 03/27/19 0812 03/28/19 0404 03/31/19 0836  NA 133* 135 134* 135 134*  K 4.2 4.2 4.2 4.0 4.3  CL 93* 96* 94* 92* 89*  CO2 20* 21* 23 23 25   GLUCOSE 280* 332* 186* 244* 44*  BUN 77* 74* 82* 88* 99*  CREATININE 12.58* 12.43* 13.00* 13.50* 13.52*  CALCIUM 8.5* 7.9* 8.1* 8.3* 8.7*  PHOS  --  7.2* 8.0*  --  7.3*   Liver Function Tests: Recent Labs  Lab 03/26/19 0748 03/27/19 0812 03/31/19 0836  ALBUMIN 1.7* 1.6* 1.8*   No results  for input(s): LIPASE, AMYLASE in the last 168 hours. No results for input(s): AMMONIA in the last 168 hours. CBC: Recent Labs  Lab 03/25/19 0201 03/26/19 0748 03/27/19 0812 03/28/19 0404 03/31/19 0836  WBC 12.0* 13.0* 13.0* 13.4* 12.8*  NEUTROABS 8.2*  --   --   --   --   HGB 9.8* 7.4* 7.6* 7.5* 8.0*  HCT 30.5* 24.0* 23.8* 24.1* 26.1*  MCV 95.0 96.8 96.0 96.4 94.9  PLT 286 217 221 209 255   Cardiac Enzymes: No results for input(s): CKTOTAL, CKMB, CKMBINDEX, TROPONINI in the last 168 hours. BNP: Invalid input(s): POCBNP CBG: Recent Labs  Lab 03/30/19 2330 03/31/19 0351 03/31/19 0800 03/31/19 0844 03/31/19 0933  GLUCAP 126* 123* 45* 37* 154*   D-Dimer No results for input(s): DDIMER in the last 72 hours. Hgb A1c No results for input(s): HGBA1C in the last 72 hours. Lipid Profile No results for input(s): CHOL, HDL, LDLCALC, TRIG, CHOLHDL, LDLDIRECT in the last 72 hours. Thyroid function studies No results for input(s): TSH, T4TOTAL, T3FREE, THYROIDAB in the last 72 hours.  Invalid input(s): FREET3 Anemia work up No results for input(s): VITAMINB12, FOLATE, FERRITIN, TIBC, IRON, RETICCTPCT in the last 72 hours. Urinalysis    Component Value Date/Time   COLORURINE YELLOW 06/13/2017 2015   APPEARANCEUR HAZY (A) 06/13/2017 2015   LABSPEC 1.015 06/13/2017 2015   PHURINE 5.0 06/13/2017 2015   GLUCOSEU  >=500 (A) 06/13/2017 2015   HGBUR NEGATIVE 06/13/2017 2015   BILIRUBINUR NEGATIVE 06/13/2017 2015   KETONESUR 5 (A) 06/13/2017 2015   PROTEINUR 100 (A) 06/13/2017 2015   NITRITE NEGATIVE 06/13/2017 2015   LEUKOCYTESUR NEGATIVE 06/13/2017 2015   Sepsis Labs Invalid input(s): PROCALCITONIN,  WBC,  LACTICIDVEN Microbiology No results found for this or any previous visit (from the past 240 hour(s)).   Time coordinating discharge: Over 30 minutes  SIGNED:   Donnamarie Poag [place] DO  [facility] 03/31/2019, 11:01 AM

## 2019-03-31 NOTE — Progress Notes (Signed)
Nutrition Follow-up  DOCUMENTATION CODES:   Non-severe (moderate) malnutrition in context of chronic illness  INTERVENTION:   -Double protein portions with meals -Continue renal MVI daily -Continue 30 ml Prostat TID with meals, each supplement provides 100 kcals and 15 grams protein  NUTRITION DIAGNOSIS:   Moderate Malnutrition related to chronic illness(ESRD on PD, DM) as evidenced by mild fat depletion, moderate fat depletion, mild muscle depletion, moderate muscle depletion.  Ongoing  GOAL:   Patient will meet greater than or equal to 90% of their needs  Progressing   MONITOR:   PO intake, Supplement acceptance, Labs, Weight trends, Skin, I & O's  REASON FOR ASSESSMENT:   Consult Wound healing  ASSESSMENT:   Jorge [name] is a 49 y.o. male with medical history significant for ESRD on peritoneal dialysis, hypertension, insulin-dependent diabetes mellitus, and anemia, now presenting to emergency department for evaluation of swelling, drainage, and following up from his right foot.  Patient reports that he has had an ulcer at the plantar aspect of his left foot for at least a couple months and developed ulceration involving the right foot approximately 2 weeks ago.  The right foot has developed some swelling with foul odor and drainage over the past week or so.  He denies any fevers or chills, denies pain, denies any shortness of breath, cough, or sore throat.  Reports that he recently went to the area, is established with a nephrologist, but does not have a PCP yet.  3/17- PROCEDURE: Transtibial amputation bilateral Application of Prevena wound VAC bilateral  Attempted to speak with pt x 2. Attempted to speak with pt via phone, however, no answer. On second visit, pt in with MD.   Nephrology requesting RD increase calorie and protein content of meals secondary to malnutrition. Pt also with increased nutritional needs secondary to post-op healing. Pt has been consuming 100%  of meals and taking Prostat supplements. Noted hypoglycemic episodes x 2 within the past 24 hours. Pt also complained of missing items on trays.   Plan to d/c to [facility] today.   Labs reviewed: Na: 134, Phos: 7.3, CBGS: 37-154 (inpatient orders for glycemic control are 0-15 units insulin aspart TID with meals and 20 units insulin glargine daily).   Diet Order:   Diet Order            Diet - low sodium heart healthy        Diet renal/carb modified with fluid restriction Diet-HS Snack? Nothing; Fluid restriction: 2000 mL Fluid; Room service appropriate? Yes; Fluid consistency: Thin  Diet effective now              EDUCATION NEEDS:   Education needs have been addressed  Skin:  Skin Assessment: Skin Integrity Issues: Skin Integrity Issues:: Wound VAC Wound Vac: bilateral BKAs Diabetic Ulcer: n/a  Last BM:  03/29/19  Height:   Ht Readings from Last 1 Encounters:  03/21/19 5\' 11"  (1.803 m)    Weight:   Wt Readings from Last 1 Encounters:  03/31/19 69.4 kg    Ideal Body Weight:  68 kg(bilateral BKA)  BMI:  Body mass index is 21.34 kg/m.  Estimated Nutritional Needs:   Kcal:  0626-9485  Protein:  120-135 grams  Fluid:  1000 ml + UOP    Loistine Chance, RD, LDN, [place] Registered Dietitian II Certified Diabetes Care and Education Specialist Please refer to [facility] for RD and/or RD on-call/weekend/after hours pager

## 2019-03-31 NOTE — Progress Notes (Signed)
Pt belongings gathered and report called to [place]. Pt taken down to [place] room 8 by NT.

## 2019-03-31 NOTE — Progress Notes (Signed)
Courtney Heys, MD  Physician  Physical Medicine and Rehabilitation  PMR Pre-admission      Signed  Date of Service:  03/31/2019 12:13 PM      Related encounter: ED to Hosp-Admission (Current) from 03/19/2019 in [place]        Show:Clear all _0 Manual_1 Template_2 Copied  Added by: _3 Cristina Gong, RN_4 Courtney Heys, MD  _5 Hover for details PMR Admission Coordinator Pre-Admission Assessment   Patient: Jorge [name] is an 49 y.o., male MRN: [id number] DOB: 09-26-1970 Height: _6  (180.3 cm) Weight: 69.4 kg   Insurance Information HMO:     PPO:      PCP:      IPA:      80/20:      OTHER:  PRIMARY: [employer] [place]      Policy#: [id number]      Subscriber: pt CM Name: Alyse Low      Phone#: [phone]     Fax#: e-mail at [email or web address]  Pre-Cert#: [id number] approved until 04/14/2019 reviews to [facility] phone [phone] and e-mail [email or web address] using provided template      Employer:  Benefits:  Phone #: [phone]     Name: 3/22 Eff. Date: 01/09/2019     Deduct: none      Out of Pocket Max: [phone]      Life Max: none Approved in network due to patient temporarily traveling. Billed at the medicare allowable rate for in network. Patient advised to change to [facility] medicare product and Medicaid of [place] if to remain in [place] long term CIR: 100% per medicare      SNF:  Outpatient:      Co-Pay:  Home Health:       Co-Pay:  DME:      Co-Pay:  Providers: approved CIR at [facility] allowable rate due to out of network with his [employer] product  SECONDARY: [place] Medicaid not active for [place]; Patient advised to apply for [place] medicaid if to remain long term       Medicaid Application Date:       Case Manager:  Disability Application Date:       Case Worker:    The "Data Collection Information Summary" for patients in Inpatient Rehabilitation Facilities with attached "Privacy Act  [place] Records" was provided and verbally reviewed with: Patient   Emergency Contact Information         Contact Information     Name Relation Home Work Moccasin, Langley Gauss Sister     [phone]    Ran, Tullis     [phone]         Current Medical History  Patient Admitting Diagnosis: bilateral BKA   History of Present Illness: 49 year old male with medical history significant for ESRD on peritoneal dialysis, HTN, IDDM, anemia, presented to ED on 03/19/2019 for evaluation of swelling, drainage of right foot. Patient reported he had an ulcer at the plantar aspect of his left foot for at least a couple of months and had developed ulceration involving right foot over past 2 weeks. Patient recently moved to area and had established with a nephrologist, but not a PCP.   Xray left foot with soft tissue defect adjacent plantar calcaneous consistent with ulceration. Right foot with soft tissue gas projected over the 1/2 metatarsophalangeal joints suspicious for gangrene, erosions/bony destruction 2nd proximal phalanx and likely great toe proximal phalanx concerning for osteo. Patient underwent Bilateral  BKA on 3/17 with Dr. Sharol Given. Antibiotics with Vancomycin and Zosyn discontinued postoperatively. Continues with wound VACS and to use for one week per ortho. Continue Norco and gabapentin for pain. Robaxin for muscle spasms.    Continue peritoneal dialysis per nephrology.    Hgb A1c 7.8/ Home regimen includes Lantus and Novolin. Hypoglycemic event this am therefore to decrease Lantus daily. Continue insulin coverage with sliding scale. Continue close monitoring .   Patient[facility]  has been reviewed by the rehabilitation admission coordinator and physician.   Past Medical History      Past Medical History:  Diagnosis Date  . Diabetes mellitus without complication ([place])    . Hypertension    . Peritoneal dialysis status ([place])    .  Renal disorder        Family History   family history is not on file.   Prior Rehab/Hospitalizations Has the patient had prior rehab or hospitalizations prior to admission? Yes   Has the patient had major surgery during 100 days prior to admission? Yes              Current Medications   Current Facility-Administered Medications:  .  0.9 %  sodium chloride infusion, 250 mL, Intravenous, PRN, Newt Minion, MD, New Bag at 03/25/19 0815 .  0.9 %  sodium chloride infusion, , Intravenous, Continuous, Newt Minion, MD, Stopped at 03/25/19 1745 .  acetaminophen (TYLENOL) tablet 325-650 mg, 325-650 mg, Oral, Q6H PRN, Newt Minion, MD .  amLODipine (NORVASC) tablet 10 mg, 10 mg, Oral, Daily, Newt Minion, MD, Stopped at 03/31/19 1028 .  atorvastatin (LIPITOR) tablet 20 mg, 20 mg, Oral, q1800, Newt Minion, MD, 20 mg at 03/30/19 1812 .  bisacodyl (DULCOLAX) suppository 10 mg, 10 mg, Rectal, Daily PRN, Newt Minion, MD .  calcitRIOL (ROCALTROL) capsule 0.5 mcg, 0.5 mcg, Oral, Once per day on Mon Wed Fri, Duda, Marcus V, MD, 0.5 mcg at 03/30/19 0847 .  cloNIDine (CATAPRES) tablet 0.1 mg, 0.1 mg, Oral, QHS, Newt Minion, MD, 0.1 mg at 03/30/19 2049 .  [START ON 04/01/2019] Darbepoetin Alfa (ARANESP) injection 200 mcg, 200 mcg, Subcutaneous, Q Wed-1800, Alric Seton, PA-C .  dialysis solution 2.5% low-MG/low-CA dianeal solution, , Intraperitoneal, Q24H, Penninger, Ria Comment, [place] .  docusate sodium (COLACE) capsule 100 mg, 100 mg, Oral, BID, Newt Minion, MD, 100 mg at 03/29/19 2131 .  feeding supplement (PRO-STAT SUGAR FREE 64) liquid 30 mL, 30 mL, Oral, TID BM, Newt Minion, MD, 30 mL at 03/31/19 1013 .  gabapentin (NEURONTIN) capsule 100 mg, 100 mg, Oral, TID, Newt Minion, MD, 100 mg at 03/31/19 1013 .  gentamicin cream (GARAMYCIN) 0.1 % 1 application, 1 application, Topical, Daily, Penninger, [place], [place], Stopped at 03/30/19 1008 .  heparin 2,500 Units in dialysis solution 2.5%  low-MG/low-CA 5,000 mL dialysis solution, , Peritoneal Dialysis, PRN, Penninger, [place], [place], Given at 03/29/19 1945 .  HYDROcodone-acetaminophen (NORCO) 7.5-325 MG per tablet 1-2 tablet, 1-2 tablet, Oral, Q4H PRN, Newt Minion, MD, 2 tablet at 03/30/19 2048 .  HYDROcodone-acetaminophen (NORCO/VICODIN) 5-325 MG per tablet 1-2 tablet, 1-2 tablet, Oral, Q4H PRN, Newt Minion, MD, 2 tablet at 03/29/19 1720 .  insulin aspart (novoLOG) injection 0-15 Units, 0-15 Units, Subcutaneous, TID WC, Kathie Dike, MD, 2 Units at 03/30/19 1812 .  [START ON 04/01/2019] insulin glargine (LANTUS) injection 20 Units, 20 Units, Subcutaneous, Daily, [place] Eric J, DO .  labetalol ([place])  injection 10 mg, 10 mg, Intravenous, Q2H PRN, Newt Minion, MD, 10 mg at 03/20/19 1554 .  magnesium citrate solution 1 Bottle, 1 Bottle, Oral, Once PRN, Newt Minion, MD .  methocarbamol (ROBAXIN) tablet 500 mg, 500 mg, Oral, Q6H PRN, 500 mg at 03/25/19 1744 **OR** methocarbamol (ROBAXIN) 500 mg in dextrose 5 % 50 mL IVPB, 500 mg, Intravenous, Q6H PRN, Newt Minion, MD .  metoCLOPramide (REGLAN) tablet 5-10 mg, 5-10 mg, Oral, Q8H PRN **OR** metoCLOPramide (REGLAN) injection 5-10 mg, 5-10 mg, Intravenous, Q8H PRN, Newt Minion, MD .  morphine 2 MG/ML injection 0.5-1 mg, 0.5-1 mg, Intravenous, Q2H PRN, Newt Minion, MD, 1 mg at 03/31/19 0847 .  multivitamin (RENA-VIT) tablet 1 tablet, 1 tablet, Oral, QHS, Newt Minion, MD, 1 tablet at 03/30/19 2048 .  ondansetron (ZOFRAN) tablet 4 mg, 4 mg, Oral, Q6H PRN **OR** ondansetron (ZOFRAN) injection 4 mg, 4 mg, Intravenous, Q6H PRN, Newt Minion, MD .  polyethylene glycol (MIRALAX / GLYCOLAX) packet 17 g, 17 g, Oral, Daily PRN, Newt Minion, MD .  sevelamer carbonate (RENVELA) tablet 2,400 mg, 2,400 mg, Oral, TID WC, Newt Minion, MD, 2,400 mg at 03/31/19 0806 .  sevelamer carbonate (RENVELA) tablet 800 mg, 800 mg, Oral, BID PRN, Penninger, [place], PA .  sodium  bicarbonate tablet 1,300 mg, 1,300 mg, Oral, TID, Newt Minion, MD, 1,300 mg at 03/31/19 1014 .  sodium chloride flush (NS) 0.9 % injection 3 mL, 3 mL, Intravenous, Q12H, Newt Minion, MD, 3 mL at 03/31/19 1028 .  sodium chloride flush (NS) 0.9 % injection 3 mL, 3 mL, Intravenous, Q12H, Newt Minion, MD, 3 mL at 03/31/19 1027 .  sodium chloride flush (NS) 0.9 % injection 3 mL, 3 mL, Intravenous, PRN, Newt Minion, MD   Patients Current Diet:     Diet Order                      Diet - low sodium heart healthy           Diet renal/carb modified with fluid restriction Diet-HS Snack? Nothing; Fluid restriction: 2000 mL Fluid; Room service appropriate? Yes; Fluid consistency: Thin  Diet effective now                   Precautions / Restrictions Precautions Precautions: Fall Precaution Comments: wound VACs bilateral residual limbs, limb protectors Restrictions Weight Bearing Restrictions: Yes RLE Weight Bearing: Non weight bearing LLE Weight Bearing: Non weight bearing    Has the patient had 2 or more falls or a fall with injury in the past year? No   Prior Activity Level Limited Community (1-2x/wk): recently moved in wiht sister from [place] due to declining health   Prior Functional Level Self Care: Did the patient need help bathing, dressing, using the toilet or eating? Independent   Indoor Mobility: Did the patient need assistance with walking from room to room (with or without device)? Needed some help   Stairs: Did the patient need assistance with internal or external stairs (with or without device)? Needed some help   Functional Cognition: Did the patient need help planning regular tasks such as shopping or remembering to take medications? Independent   Home Assistive Devices / Equipment Home Assistive Devices/Equipment: CBG Meter Home Equipment: Toilet riser   Prior Device Use: Indicate devices/aids used by the patient prior to current illness, exacerbation  or injury? Walker and wheelchair   Current Functional  Level Cognition   Overall Cognitive Status: Within Functional Limits for tasks assessed Orientation Level: Oriented X4 General Comments: pt is highly motivated to work with therapy and states "ill do whatever I have to to get stronger";educated pt on grieving process associated with amputations and educated him on desensitization strategies associated with phantom limb sensation and phantom limb pain    Extremity Assessment (includes Sensation/Coordination)   Upper Extremity Assessment: Overall WFL for tasks assessed  Lower Extremity Assessment: Defer to PT evaluation RLE Deficits / Details: pt able to fully extend knee LLE Deficits / Details: pt able to fully extend knee     ADLs   Overall ADL's : Needs assistance/impaired Eating/Feeding: Independent, Sitting Grooming: Set up, Sitting Upper Body Bathing: Supervision/ safety, Set up, Sitting Lower Body Bathing: Sitting/lateral leans, Moderate assistance Upper Body Dressing : Set up, Sitting Lower Body Dressing: Sitting/lateral leans, Moderate assistance Toilet Transfer: Minimal assistance, +2 for physical assistance, +2 for safety/equipment, Anterior/posterior, Cueing for safety Toileting- Clothing Manipulation and Hygiene: Minimal assistance, Sitting/lateral lean Functional mobility during ADLs: Minimal assistance, +2 for physical assistance, Cueing for safety General ADL Comments: session focused on UE HEP and general core strengthenign     Mobility   Overal bed mobility: Needs Assistance Bed Mobility: Supine to Sit Rolling: Min guard Supine to sit: Min guard Sit to supine: Min guard General bed mobility comments: pt able to roll bil for hip extension. Transition from supine to sitting without assist     Transfers   Overall transfer level: Needs assistance Equipment used: Rolling walker (2 wheeled) Transfers: Lateral/Scoot Transfers Anterior-Posterior transfers: +2  physical assistance, Mod assist  Lateral/Scoot Transfers: Min assist General transfer comment: cues for anterior translation of trunk, cues for completeness of instruction for setup and min assist to fully clear hips onto surface with lateral transfer     Ambulation / Gait / Stairs / Proofreader / Balance Dynamic Sitting Balance Sitting balance - Comments: able to sit unsupported in upright posture for 2 min without LOB and pt able to self-correct with UE support and with trunk control Balance Overall balance assessment: Needs assistance Sitting-balance support: No upper extremity supported, Feet unsupported Sitting balance-Leahy Scale: Fair Sitting balance - Comments: able to sit unsupported in upright posture for 2 min without LOB and pt able to self-correct with UE support and with trunk control     Special needs/care consideration BiPAP/CPAP n/a CPM  N/a Continuous Drip IV  N/a Dialysis ESRD on CCPD for 2 years. Dry dwell during the day and CCPD nightly that patient managed himself pta Life Vest  N/a Oxygen  N/a Special Bed  N/a Trach Size  N/a Wound Vac bilateral VACS to B BKA applied by surgeon Skin bilateral surgical incisions to BKA's Bowel mgmt:  continent Bladder mgmt: anuric Diabetic mgmt: Hgb A1c 7.8 Behavioral consideration  N/a Chemo/radiation  N/a Designated visitors are sister, Langley Gauss and daughter, [name]    Previous Home Environment  Living Arrangements: (moved in 2 months ago with sister and his daughter Engineer, maintenance)  Lives With: Family, Daughter(sister and his daughter) Available Help at Discharge: Family, Available 24 hours/day Type of Home: House Home Layout: Two level(patient lives in the basement) Alternate Level Stairs-Rails: (entrance to basement with one bump up step) Alternate Level Stairs-Number of Steps: to go to main level is a flight inside and 4 to 5 steps outside Home Access: Level entry Entrance Stairs-Number of Steps:  level into basement Bathroom  Shower/Tub: (tub shower on main level) Bathroom Toilet: Standard Bathroom Accessibility: Yes(1/2 bath in basement) How Accessible: Accessible via walker, Accessible via wheelchair [place]: No   Discharge Living Setting Plans for Discharge Living Setting: Lives with (comment)(sister and his daughter from [place] for past 2 months) Type of Home at Discharge: House Discharge Home Layout: Two level(patietn lives in basement of home with 1/2 bath and level en) Alternate Level Stairs-Rails: (flight inside to main level and 4 to 5 steps outside to main) Discharge Home Access: Level entry Discharge Bathroom Shower/Tub: Tub/shower unit(on main level; 1/2 bath is in basement) Discharge Bathroom Toilet: Standard Discharge Bathroom Accessibility: Yes How Accessible: Accessible via wheelchair, Accessible via walker Does the patient have any problems obtaining your medications?: No   Social/Family/Support Systems Patient Roles: Parent Contact Information: sister Langley Gauss and daughter, [name] Anticipated Caregiver: sister, daughter and son who lives local Anticipated Caregiver's Contact Information: see above Caregiver Availability: 24/7 Discharge Plan Discussed with Primary Caregiver: Yes Is Caregiver In Agreement with Plan?: Yes Does Caregiver/Family have Issues with Lodging/Transportation while Pt is in Rehab?: No   Goals/Additional Needs Patient/Family Goal for Rehab: Mod I PT and OT at wheelchair level Expected length of stay: ELOS 7 to 10 days Equipment Needs: CCPD peritoneal dialysis patient does himself at home Special Service Needs: CCPD nightly Pt/Family Agrees to Admission and willing to participate: Yes Program Orientation Provided & Reviewed with Pt/Caregiver Including Roles  & Responsibilities: Yes   Decrease burden of Care through IP rehab admission: n/a   Possible need for SNF placement upon discharge: n/a; Can not go to SNF on CCPD    Patient Condition: I have reviewed medical records from [facility] , spoken with CM, and patient. I met with patient at the bedside for inpatient rehabilitation assessment.  Patient will benefit from ongoing PT and OT, can actively participate in 3 hours of therapy a day 5 days of the week, and can make measurable gains during the admission.  Patient will also benefit from the coordinated team approach during an Inpatient Acute Rehabilitation admission.  The patient will receive intensive therapy as well as Rehabilitation physician, nursing, social worker, and care management interventions.  Due to bladder management, bowel management, safety, skin/wound care, disease management, medication administration, pain management and patient education the patient requires 24 hour a day rehabilitation nursing.  The patient is currently min assist with mobility and basic ADLs.  Discharge setting and therapy post discharge at home with home health is anticipated.  Patient has agreed to participate in the Acute Inpatient Rehabilitation Program and will admit today.   Preadmission Screen Completed By:  Cleatrice Burke, 03/31/2019 12:14 PM ______________________________________________________________________   Discussed status with Dr. Dagoberto Ligas on  03/31/2019 at  76 and received approval for admission today.   Admission Coordinator:  Cleatrice Burke, RN, time  2440 Date  03/31/2019    Assessment/Plan: Diagnosis: 1. Does the need for close, 24 hr/day Medical supervision in concert with the patient's rehab needs make it unreasonable for this patient to be served in a less intensive setting? Yes 2. Co-Morbidities requiring supervision/potential complications: ESRD, B/L BKAs for gangrene, DM with A1c of 7.8, and hypotension, anemia and HTN 3. Due to bowel management, safety, skin/wound care, disease management, medication administration, pain management and patient education, does the patient  require 24 hr/day rehab nursing? Yes 4. Does the patient require coordinated care of a physician, rehab nurse, PT, OT, and SLP to address physical and functional deficits  in the context of the above medical diagnosis(es)? Yes Addressing deficits in the following areas: balance, endurance, strength, transferring, bathing, dressing, feeding, grooming and toileting 5. Can the patient actively participate in an intensive therapy program of at least 3 hrs of therapy 5 days a week? Yes 6. The potential for patient to make measurable gains while on inpatient rehab is good 7. Anticipated functional outcomes upon discharge from inpatient rehab: modified independent and supervision PT, modified independent and supervision OT, n/a SLP 8. Estimated rehab length of stay to reach the above functional goals is: 7-10 days 9. Anticipated discharge destination: Home 10. Overall Rehab/Functional Prognosis: good     MD Signature:          Revision History

## 2019-03-31 NOTE — Progress Notes (Signed)
[facility] Progress Note   Dialysis Orders: [facility] CCPD 7 Days weekly 80 kg 4 exchanges 1.6 mls Dwell time 1.5 hr, No pause or day dwells. Uses mostly 2.5% solution  BMD meds: -Renvela 800 mg 3 tabs PO TID with meals -Calcitriol 0.5 mcg PO TIW (MWF) -   Assessment/Plan: 1. Gangrene/ bilateral Osteo feet with sever PVD-s/p bilateral BKA  3/17. Planning on CIR transfer. 2. DM/Hyperglycemia: Per primary -hypoglycemia - eating all meals. Per primary. 3. ESRD - Continue CCPD per home orders. Have been using 2.5% solution all exchanges. d/c lasix d/t little urine output. On Na bicarb - no labs since 3/20 - - labs pending for today 4. Hypertension/volume -BP well controlled.Notantihypertensive meds as OP. Amlodipine startedhere on3/13 Continue PD with same orders.Significantly under edw, down to 67.9kgon 3/19.Does not appear volume overload. Will need new edw at d/c. Net UF 2.3 3/21 1120 3/22- stump protectors adding to total weights - need to be taking into consideration 5. Anemia - HGB initially 7.7 fell to 5.4 on3/12 and transfused two units PRBCs, up to 9.9 but now down to 7.5but trending up to 8 today 3/23. Marland KitchenHeme neg 3/11 Had Mircera 225 mcg and Venofer 200 mg IV 03/18/2019. 3/12 tsat 23% ferritin 1688 CRP 15.9.suspect Ferritin is inflammatory will repeat studies Due for ESA this week. Transfuse as indicated.  6. Metabolic bone disease -CCa in goal.LastPhos^ 8.0. Addedbinders w/snacks. ContinueVDRA. Labs pending 7. Nutrition - Albumin low add prostat, renal vits.-Renal/carb modified diet. Alb very low - asked RD to see - needs more protein and kcal  Myriam Jacobson, PA-C [place] [phone] 03/31/2019,9:24 AM  LOS: 12 days   Subjective:   Cleared for CIR. Waiting on Bed. PD going well. Problems with BS drops.  Objective Vitals:   03/30/19 1941 03/30/19 2300 03/31/19 0348 03/31/19 0719  BP: (!)  105/51  (!) 113/53 (!) 120/45  Pulse: 68  76 72  Resp: 18  19 16   Temp: 98.3 F (36.8 C)  98.4 F (36.9 C) 98.7 F (37.1 C)  TempSrc: Oral  Oral Oral  SpO2: 98% 98% 98% 97%  Weight:      Height:       Physical Exam General:slender male NAD on room air somewhat sweaty Heart: RRR Lungs: no rales Abdomen: + BS soft  Extremities:bilateral BKA with VAC/stump protectors in place - Dialysis Access:  PD cath   Additional Objective Labs: Basic Metabolic Panel: Recent Labs  Lab 03/26/19 0748 03/27/19 0812 03/28/19 0404  NA 135 134* 135  K 4.2 4.2 4.0  CL 96* 94* 92*  CO2 21* 23 23  GLUCOSE 332* 186* 244*  BUN 74* 82* 88*  CREATININE 12.43* 13.00* 13.50*  CALCIUM 7.9* 8.1* 8.3*  PHOS 7.2* 8.0*  --    Liver Function Tests: Recent Labs  Lab 03/26/19 0748 03/27/19 0812  ALBUMIN 1.7* 1.6*   No results for input(s): LIPASE, AMYLASE in the last 168 hours. CBC: Recent Labs  Lab 03/25/19 0201 03/25/19 0201 03/26/19 0748 03/26/19 0748 03/27/19 0812 03/28/19 0404 03/31/19 0836  WBC 12.0*   < > 13.0*   < > 13.0* 13.4* 12.8*  NEUTROABS 8.2*  --   --   --   --   --   --   HGB 9.8*   < > 7.4*   < > 7.6* 7.5* 8.0*  HCT 30.5*   < > 24.0*   < > 23.8* 24.1* 26.1*  MCV 95.0  --  96.8  --  96.0 96.4 94.9  PLT 286   < > 217   < > 221 209 255   < > = values in this interval not displayed.   Blood Culture    Component Value Date/Time   SDES BLOOD SITE NOT SPECIFIED 03/19/2019 2016   SPECREQUEST  03/19/2019 2016    BOTTLES DRAWN AEROBIC AND ANAEROBIC Blood Culture adequate volume   CULT  03/19/2019 2016    NO GROWTH 5 DAYS Performed at [facility], [place] [address], [place], [place] [address]    REPTSTATUS 03/24/2019 FINAL 03/19/2019 2016    Cardiac Enzymes: No results for input(s): CKTOTAL, CKMB, CKMBINDEX, TROPONINI in the last 168 hours. CBG: Recent Labs  Lab 03/30/19 2035 03/30/19 2330 03/31/19 0351 03/31/19 0800 03/31/19 0844  GLUCAP 98 126* 123* 45* 37*    Iron Studies: No results for input(s): IRON, TIBC, TRANSFERRIN, FERRITIN in the last 72 hours. No results found for: INR, PROTIME Studies/Results: No results found. Medications: . sodium chloride    . sodium chloride Stopped (03/25/19 1745)  . dialysis solution 2.5% low-MG/low-CA    . methocarbamol (ROBAXIN) IV     . amLODipine  10 mg Oral Daily  . atorvastatin  20 mg Oral q1800  . calcitRIOL  0.5 mcg Oral Once per day on Mon Wed Fri  . cloNIDine  0.1 mg Oral QHS  . [START ON 04/01/2019] darbepoetin (ARANESP) injection - DIALYSIS  200 mcg Subcutaneous Q Wed-1800  . docusate sodium  100 mg Oral BID  . feeding supplement (PRO-STAT SUGAR FREE 64)  30 mL Oral TID BM  . gabapentin  100 mg Oral TID  . gentamicin cream  1 application Topical Daily  . insulin aspart  0-15 Units Subcutaneous TID WC  . insulin glargine  25 Units Subcutaneous Daily  . multivitamin  1 tablet Oral QHS  . sevelamer carbonate  2,400 mg Oral TID WC  . sodium bicarbonate  1,300 mg Oral TID  . sodium chloride flush  3 mL Intravenous Q12H  . sodium chloride flush  3 mL Intravenous Q12H

## 2019-03-31 NOTE — Plan of Care (Signed)

## 2019-03-31 NOTE — Progress Notes (Signed)
PROGRESS NOTE    Jorge [name]  OFH:[id number] DOB: 28-Nov-1970 DOA: 03/19/2019 PCP: Patient, No Pcp Per    Brief Narrative:  Jorge [name] is a 49 y.o. male with medical history significant for ESRD on peritoneal dialysis, hypertension, insulin-dependent diabetes mellitus, and anemia, now presenting to emergency department for evaluation of swelling, drainage, and following up from his right foot.  Patient reports that he has had an ulcer at the plantar aspect of his left foot for at least a couple months and developed ulceration involving the right foot approximately 2 weeks ago.  The right foot has developed some swelling with foul odor and drainage over the past week or so.  He denies any fevers or chills, denies pain, denies any shortness of breath, cough, or sore throat.  Reports that he recently went to the area, is established with a nephrologist, but does not have a PCP yet.  Upon arrival to the ED, patient is found to be afebrile, saturating well on room air, and stable pressure. Chemistry panel notable for glucose of 321, BUN 78, bicarbonate 13, anion gap 20, and albumin 2.1.  CBC features a leukocytosis to 16,200 and normocytic anemia: 7.7, down from 10.3 last month.  Lactic acid was elevated to 2.4 initially.  Fecal occult blood testing was negative.  Blood cultures were collected in the emergency department, broad-spectrum antibiotics were started, and orthopedic surgeon and nephrologist were consulted by the ED physician.  COVID-19 screening test has not yet resulted.   Assessment & Plan:   Principal Problem:   Diabetic foot infection ([name]) Active Problems:   ESRD (end stage renal disease) ([name])   Hypertension   Insulin-requiring or dependent type II diabetes mellitus ([name])   Normocytic anemia   ESRD on peritoneal dialysis ([name])   Osteomyelitis of right foot ([name])   Gangrene of right foot ([name])   Gangrene of left foot ([name])   Malnutrition of moderate degree   Sepsis  secondary to osteomyelitisGangrene bilateral feet, POA Patient presenting with ulcerations to bilateral feet with malodorous discharge.  X-ray left foot with soft tissue defect adjacent plantar calcaneus consistent with ulceration.  Right foot x-ray with soft tissue gas projected over the 1/2 metatarsophalangeal joints suspicion for gangrene, erosions/ bony destruction 2nd proximal phalanx and likely great toe proximal phalanx concerning for osteomyelitis.  CRP elevated 10.1 and ESR greater than 140.  Antibiotics with vancomycin and Zosyn discontinued 24 hours postoperatively. --status post bilateral BKA by Dr. Sharol Given 03/25/2019 --continue wound VAC;  Maintain 1 week following discharge per ortho --nonweightbearing bilateral lower extremities --pending CIR given patient on peritoneal dialysis and SNFs do not accommodate PD --Norco prn for pain control  ESRD on peritoneal dialysis --nephrology following; appreciate assistance  Anemia of chronic Disease/acute postoperative blood loss anemia Iron level 38, TIBC 162, ferritin 1,688, and folate 34.9; consistent with anemia of chronic disease. --Hgb 7.7-->5.4-->9.1.Marland KitchenMarland Kitchen7.4-->7.6-->7.5 --s/p transfusion 3u pRBC on 03/20/2019  Essential hypertension BP 113/53 this am --amlodipine 10 mg p.o. daily --clonidine 0.30m po qHS  HLD:  Continue atorvastatin 20 mg p.o. daily  Insulin-dependent diabetes mellitus Hemoglobin A1c 7.8, not well controlled.  Home regimen includes Lantus 10 units subcutaneously daily, Novolin insulin sliding scale. --Hypoglycemic event yesterday with blood glucose 39; asymptomatic --Decrease Lantus from 28 to 25 units subcutaneously daily --Discontinued as scheduled mealtime NovoLog --continue moderate insulin sliding scale to sensitive for further coverage --continue monitor CBG's qAC/HS   DVT prophylaxis:  SCDs Code Status:  Full code Family Communication:  Discussed  with patient extensively at bedside Disposition Plan:       Patient is from: home     Anticipated Disposition:  CIR given need for peritoneal dialysis     Barriers to discharge or conditions that needs to be met prior to discharge: awaiting CIR bed.    Consultants:    Orthopedics, Dr. Sharol Given  Procedures:   BKA, bilateral - Dr Sharol Given 3/17  Antimicrobials:    vancomycin 3/11 - 3/18   Zosyn 3/11 - 3/18   Subjective: Patient seen examined bedside, resting comfortably.  Reports pain is relatively controlled, but requesting 1 dose of IV pain medicine today for breakthrough pain this morning.  No other complaints.  Awaiting CIR bed.  Denies headache, no fever/chills/night sweats, no nausea/vomiting/diarrhea, no chest pain, palpitations, no shortness of breath, no abdominal pain.  No acute concerns overnight per nursing staff.  Objective: Vitals:   03/30/19 1941 03/30/19 2300 03/31/19 0348 03/31/19 0719  BP: (!) 105/51  (!) 113/53 (!) 120/45  Pulse: 68  76 72  Resp: 18  19 16   Temp: 98.3 F (36.8 C)  98.4 F (36.9 C) 98.7 F (37.1 C)  TempSrc: Oral  Oral Oral  SpO2: 98% 98% 98% 97%  Weight:      Height:        Intake/Output Summary (Last 24 hours) at 03/31/2019 0817 Last data filed at 03/31/2019 0300 Gross per 24 hour  Intake --  Output 375 ml  Net -375 ml   Filed Weights   03/28/19 1838 03/29/19 0333 03/30/19 0402  Weight: 67.8 kg 68.3 kg 71 kg    Examination:  General exam: Appears calm and comfortable  Respiratory system: Clear to auscultation. Respiratory effort normal. Cardiovascular system: S1 & S2 heard, RRR. No JVD, murmurs, rubs, gallops or clicks. No pedal edema. Gastrointestinal system: Abdomen is nondistended, soft and nontender. No organomegaly or masses felt. Normal bowel sounds heard. Central nervous system: Alert and oriented. No focal neurological deficits. Extremities: Symmetric 5 x 5 power.  Bilateral BKA noted with stump protectors and wound vacs in place Skin: No rashes, lesions or ulcers Psychiatry: Judgement  and insight appear normal. Mood & affect appropriate.     Data Reviewed: I have personally reviewed following labs and imaging studies  CBC: Recent Labs  Lab 03/25/19 0201 03/26/19 0748 03/27/19 0812 03/28/19 0404  WBC 12.0* 13.0* 13.0* 13.4*  NEUTROABS 8.2*  --   --   --   HGB 9.8* 7.4* 7.6* 7.5*  HCT 30.5* 24.0* 23.8* 24.1*  MCV 95.0 96.8 96.0 96.4  PLT 286 217 221 027   Basic Metabolic Panel: Recent Labs  Lab 03/25/19 0201 03/26/19 0748 03/27/19 0812 03/28/19 0404  NA 133* 135 134* 135  K 4.2 4.2 4.2 4.0  CL 93* 96* 94* 92*  CO2 20* 21* 23 23  GLUCOSE 280* 332* 186* 244*  BUN 77* 74* 82* 88*  CREATININE 12.58* 12.43* 13.00* 13.50*  CALCIUM 8.5* 7.9* 8.1* 8.3*  PHOS  --  7.2* 8.0*  --    GFR: Estimated Creatinine Clearance: 6.6 mL/min (A) (by C-G formula based on SCr of 13.5 mg/dL (H)). Liver Function Tests: Recent Labs  Lab 03/26/19 0748 03/27/19 0812  ALBUMIN 1.7* 1.6*   No results for input(s): LIPASE, AMYLASE in the last 168 hours. No results for input(s): AMMONIA in the last 168 hours. Coagulation Profile: No results for input(s): INR, PROTIME in the last 168 hours. Cardiac Enzymes: No results for input(s): CKTOTAL, CKMB, CKMBINDEX, TROPONINI in  the last 168 hours. BNP (last 3 results) No results for input(s): PROBNP in the last 8760 hours. HbA1C: No results for input(s): HGBA1C in the last 72 hours. CBG: Recent Labs  Lab 03/30/19 1607 03/30/19 1954 03/30/19 2035 03/30/19 2330 03/31/19 0351  GLUCAP 131* 67* 98 126* 123*   Lipid Profile: No results for input(s): CHOL, HDL, LDLCALC, TRIG, CHOLHDL, LDLDIRECT in the last 72 hours. Thyroid Function Tests: No results for input(s): TSH, T4TOTAL, FREET4, T3FREE, THYROIDAB in the last 72 hours. Anemia Panel: No results for input(s): VITAMINB12, FOLATE, FERRITIN, TIBC, IRON, RETICCTPCT in the last 72 hours. Sepsis Labs: No results for input(s): PROCALCITON, LATICACIDVEN in the last 168  hours.  No results found for this or any previous visit (from the past 240 hour(s)).       Radiology Studies: No results found.      Scheduled Meds: . amLODipine  10 mg Oral Daily  . atorvastatin  20 mg Oral q1800  . calcitRIOL  0.5 mcg Oral Once per day on Mon Wed Fri  . cloNIDine  0.1 mg Oral QHS  . [START ON 04/01/2019] darbepoetin (ARANESP) injection - DIALYSIS  200 mcg Subcutaneous Q Wed-1800  . docusate sodium  100 mg Oral BID  . feeding supplement (PRO-STAT SUGAR FREE 64)  30 mL Oral TID BM  . gabapentin  100 mg Oral TID  . gentamicin cream  1 application Topical Daily  . insulin aspart  0-15 Units Subcutaneous TID WC  . insulin glargine  25 Units Subcutaneous Daily  . multivitamin  1 tablet Oral QHS  . sevelamer carbonate  2,400 mg Oral TID WC  . sodium bicarbonate  1,300 mg Oral TID  . sodium chloride flush  3 mL Intravenous Q12H  . sodium chloride flush  3 mL Intravenous Q12H   Continuous Infusions: . sodium chloride    . sodium chloride Stopped (03/25/19 1745)  . dialysis solution 2.5% low-MG/low-CA    . methocarbamol (ROBAXIN) IV       LOS: 12 days    Time spent: 31 minutes spent on chart review, discussion with nursing staff, consultants, updating family and interview/physical exam; more than 50% of that time was spent in counseling and/or coordination of care.    Darlis Wragg J [place] DO [facility] Available via Epic secure chat 7am-7pm After these hours, please refer to coverage provider listed on [email or web address] 03/31/2019, 8:17 AM

## 2019-03-31 NOTE — PMR Pre-admission (Signed)
PMR Admission Coordinator Pre-Admission Assessment  Patient: Jorge [name] is an 49 y.o., male MRN: [id number] DOB: December 21, 1970 Height: 5' 11"  (180.3 cm) Weight: 69.4 kg  Insurance Information HMO:     PPO:      PCP:      IPA:      80/20:      OTHER:  PRIMARY: [employer] [place]      Policy#: [id number]      Subscriber: pt CM Name: Jorge [name]      Phone#: [phone]     Fax#: e-mail at [email or web address]  Pre-Cert#: [id number] approved until 04/14/2019 reviews to Ria Comment phone [phone] and e-mail [email or web address] using provided template      Employer:  Benefits:  Phone #: [phone]     Name: 3/22 Eff. Date: 01/09/2019     Deduct: none      Out of Pocket Max: [phone]      Life Max: none Approved in network due to patient temporarily traveling. Billed at the medicare allowable rate for in network. Patient advised to change to [facility] medicare product and Medicaid of [place] if to remain in Short long term CIR: 100% per medicare      SNF:  Outpatient:      Co-Pay:  Home Health:       Co-Pay:  DME:      Co-Pay:  Providers: approved CIR at [facility] allowable rate due to out of network with his [employer] product  SECONDARY: [place] Medicaid not active for [place]; Patient advised to apply for  medicaid if to remain long term      Medicaid Application Date:       Case Manager:  Disability Application Date:       Case Worker:   The "Data Collection Information Summary" for patients in Inpatient Rehabilitation Facilities with attached "Privacy Act [place] Records" was provided and verbally reviewed with: Patient  Emergency Contact Information Contact Information    Name Relation Home Work Jorge [name], Jorge [name] Sister   [phone]   Jorge [name], Jorge [name]   [phone]      Current Medical History  Patient Admitting Diagnosis: bilateral BKA  History of Present Illness: 49 year old male with medical history significant for ESRD on  peritoneal dialysis, HTN, IDDM, anemia, presented to ED on 03/19/2019 for evaluation of swelling, drainage of right foot. Patient reported he had an ulcer at the plantar aspect of his left foot for at least a couple of months and had developed ulceration involving right foot over past 2 weeks. Patient recently moved to area and had established with a nephrologist, but not a PCP.  Xray left foot with soft tissue defect adjacent plantar calcaneous consistent with ulceration. Right foot with soft tissue gas projected over the 1/2 metatarsophalangeal joints suspicious for gangrene, erosions/bony destruction 2nd proximal phalanx and likely great toe proximal phalanx concerning for osteo. Patient underwent Bilateral BKA on 3/17 with Dr. Sharol Given. Antibiotics with Vancomycin and Zosyn discontinued postoperatively. Continues with wound VACS and to use for one week per ortho. Continue Norco and gabapentin for pain. Robaxin for muscle spasms.   Continue peritoneal dialysis per nephrology.   Hgb A1c 7.8/ Home regimen includes Lantus and Novolin. Hypoglycemic event this am therefore to decrease Lantus daily. Continue insulin coverage with sliding scale. Continue close monitoring .  Patient's medical record from [facility]  has been reviewed by the rehabilitation admission coordinator and physician.  Past Medical History  Past Medical History:  Diagnosis Date  .  Diabetes mellitus without complication ([place])   . Hypertension   . Peritoneal dialysis status ([place])   . Renal disorder     Family History   family history is not on file.  Prior Rehab/Hospitalizations Has the patient had prior rehab or hospitalizations prior to admission? Yes  Has the patient had major surgery during 100 days prior to admission? Yes   Current Medications  Current Facility-Administered Medications:  .  0.9 %  sodium chloride infusion, 250 mL, Intravenous, PRN, Newt Minion, MD, New Bag at 03/25/19 0815 .  0.9 %  sodium  chloride infusion, , Intravenous, Continuous, Newt Minion, MD, Stopped at 03/25/19 1745 .  acetaminophen (TYLENOL) tablet 325-650 mg, 325-650 mg, Oral, Q6H PRN, Newt Minion, MD .  amLODipine (NORVASC) tablet 10 mg, 10 mg, Oral, Daily, Newt Minion, MD, Stopped at 03/31/19 1028 .  atorvastatin (LIPITOR) tablet 20 mg, 20 mg, Oral, q1800, Newt Minion, MD, 20 mg at 03/30/19 1812 .  bisacodyl (DULCOLAX) suppository 10 mg, 10 mg, Rectal, Daily PRN, Newt Minion, MD .  calcitRIOL (ROCALTROL) capsule 0.5 mcg, 0.5 mcg, Oral, Once per day on Mon Wed Fri, Duda, Marcus V, MD, 0.5 mcg at 03/30/19 0847 .  cloNIDine (CATAPRES) tablet 0.1 mg, 0.1 mg, Oral, QHS, Newt Minion, MD, 0.1 mg at 03/30/19 2049 .  [START ON 04/01/2019] Darbepoetin Alfa (ARANESP) injection 200 mcg, 200 mcg, Subcutaneous, Q Wed-1800, Alric Seton, PA-C .  dialysis solution 2.5% [name]-MG/[name]-CA dianeal solution, , Intraperitoneal, Q24H, Penninger, [place], [place] .  docusate sodium (COLACE) capsule 100 mg, 100 mg, Oral, BID, Newt Minion, MD, 100 mg at 03/29/19 2131 .  feeding supplement (PRO-STAT SUGAR FREE 64) liquid 30 mL, 30 mL, Oral, TID BM, Newt Minion, MD, 30 mL at 03/31/19 1013 .  gabapentin (NEURONTIN) capsule 100 mg, 100 mg, Oral, TID, Newt Minion, MD, 100 mg at 03/31/19 1013 .  gentamicin cream (GARAMYCIN) 0.1 % 1 application, 1 application, Topical, Daily, Penninger, [place], [place], Stopped at 03/30/19 1008 .  heparin 2,500 Units in dialysis solution 2.5% [name]-MG/[name]-CA 5,000 mL dialysis solution, , Peritoneal Dialysis, PRN, Penninger, [place], [place], Given at 03/29/19 1945 .  HYDROcodone-acetaminophen (NORCO) 7.5-325 MG per tablet 1-2 tablet, 1-2 tablet, Oral, Q4H PRN, Newt Minion, MD, 2 tablet at 03/30/19 2048 .  HYDROcodone-acetaminophen (NORCO/VICODIN) 5-325 MG per tablet 1-2 tablet, 1-2 tablet, Oral, Q4H PRN, Newt Minion, MD, 2 tablet at 03/29/19 1720 .  insulin aspart (novoLOG) injection 0-15 Units, 0-15  Units, Subcutaneous, TID WC, Kathie Dike, MD, 2 Units at 03/30/19 1812 .  [START ON 04/01/2019] insulin glargine (LANTUS) injection 20 Units, 20 Units, Subcutaneous, Daily, [place] Eric J, DO .  labetalol (NORMODYNE) injection 10 mg, 10 mg, Intravenous, Q2H PRN, Newt Minion, MD, 10 mg at 03/20/19 1554 .  magnesium citrate solution 1 Bottle, 1 Bottle, Oral, Once PRN, Newt Minion, MD .  methocarbamol (ROBAXIN) tablet 500 mg, 500 mg, Oral, Q6H PRN, 500 mg at 03/25/19 1744 **OR** methocarbamol (ROBAXIN) 500 mg in dextrose 5 % 50 mL IVPB, 500 mg, Intravenous, Q6H PRN, Newt Minion, MD .  metoCLOPramide (REGLAN) tablet 5-10 mg, 5-10 mg, Oral, Q8H PRN **OR** metoCLOPramide (REGLAN) injection 5-10 mg, 5-10 mg, Intravenous, Q8H PRN, Newt Minion, MD .  morphine 2 MG/ML injection 0.5-1 mg, 0.5-1 mg, Intravenous, Q2H PRN, Newt Minion, MD, 1 mg at 03/31/19 0847 .  multivitamin (RENA-VIT) tablet 1 tablet, 1 tablet, Oral, QHS, Sharol Given,  Illene Regulus, MD, 1 tablet at 03/30/19 2048 .  ondansetron (ZOFRAN) tablet 4 mg, 4 mg, Oral, Q6H PRN **OR** ondansetron (ZOFRAN) injection 4 mg, 4 mg, Intravenous, Q6H PRN, Newt Minion, MD .  polyethylene glycol (MIRALAX / GLYCOLAX) packet 17 g, 17 g, Oral, Daily PRN, Newt Minion, MD .  sevelamer carbonate (RENVELA) tablet 2,400 mg, 2,400 mg, Oral, TID WC, Newt Minion, MD, 2,400 mg at 03/31/19 0806 .  sevelamer carbonate (RENVELA) tablet 800 mg, 800 mg, Oral, BID PRN, Penninger, [place], PA .  sodium bicarbonate tablet 1,300 mg, 1,300 mg, Oral, TID, Newt Minion, MD, 1,300 mg at 03/31/19 1014 .  sodium chloride flush (NS) 0.9 % injection 3 mL, 3 mL, Intravenous, Q12H, Newt Minion, MD, 3 mL at 03/31/19 1028 .  sodium chloride flush (NS) 0.9 % injection 3 mL, 3 mL, Intravenous, Q12H, Newt Minion, MD, 3 mL at 03/31/19 1027 .  sodium chloride flush (NS) 0.9 % injection 3 mL, 3 mL, Intravenous, PRN, Newt Minion, MD  Patients Current Diet:  Diet Order             Diet - [name] sodium heart healthy        Diet renal/carb modified with fluid restriction Diet-HS Snack? Nothing; Fluid restriction: 2000 mL Fluid; Room service appropriate? Yes; Fluid consistency: Thin  Diet effective now              Precautions / Restrictions Precautions Precautions: Fall Precaution Comments: wound VACs bilateral residual limbs, limb protectors Restrictions Weight Bearing Restrictions: Yes RLE Weight Bearing: Non weight bearing LLE Weight Bearing: Non weight bearing   Has the patient had 2 or more falls or a fall with injury in the past year? No  Prior Activity Level Limited Community (1-2x/wk): recently moved in wiht sister from [place] due to declining health  Prior Functional Level Self Care: Did the patient need help bathing, dressing, using the toilet or eating? Independent  Indoor Mobility: Did the patient need assistance with walking from room to room (with or without device)? Needed some help  Stairs: Did the patient need assistance with internal or external stairs (with or without device)? Needed some help  Functional Cognition: Did the patient need help planning regular tasks such as shopping or remembering to take medications? Independent  Home Assistive Devices / Equipment Home Assistive Devices/Equipment: CBG Meter Home Equipment: Toilet riser  Prior Device Use: Indicate devices/aids used by the patient prior to current illness, exacerbation or injury? Walker and wheelchair  Current Functional Level Cognition  Overall Cognitive Status: Within Functional Limits for tasks assessed Orientation Level: Oriented X4 General Comments: pt is highly motivated to work with therapy and states "ill do whatever I have to to get stronger";educated pt on grieving process associated with amputations and educated him on desensitization strategies associated with phantom limb sensation and phantom limb pain    Extremity Assessment (includes  Sensation/Coordination)  Upper Extremity Assessment: Overall WFL for tasks assessed  Lower Extremity Assessment: Defer to PT evaluation RLE Deficits / Details: pt able to fully extend knee LLE Deficits / Details: pt able to fully extend knee    ADLs  Overall ADL's : Needs assistance/impaired Eating/Feeding: Independent, Sitting Grooming: Set up, Sitting Upper Body Bathing: Supervision/ safety, Set up, Sitting Lower Body Bathing: Sitting/lateral leans, Moderate assistance Upper Body Dressing : Set up, Sitting Lower Body Dressing: Sitting/lateral leans, Moderate assistance Toilet Transfer: Minimal assistance, +2 for physical assistance, +2 for safety/equipment, Anterior/posterior, Cueing for  safety Toileting- Clothing Manipulation and Hygiene: Minimal assistance, Sitting/lateral lean Functional mobility during ADLs: Minimal assistance, +2 for physical assistance, Cueing for safety General ADL Comments: session focused on UE HEP and general core strengthenign    Mobility  Overal bed mobility: Needs Assistance Bed Mobility: Supine to Sit Rolling: Min guard Supine to sit: Min guard Sit to supine: Min guard General bed mobility comments: pt able to roll bil for hip extension. Transition from supine to sitting without assist    Transfers  Overall transfer level: Needs assistance Equipment used: Rolling walker (2 wheeled) Transfers: Lateral/Scoot Transfers Anterior-Posterior transfers: +2 physical assistance, Mod assist  Lateral/Scoot Transfers: Min assist General transfer comment: cues for anterior translation of trunk, cues for completeness of instruction for setup and min assist to fully clear hips onto surface with lateral transfer    Ambulation / Gait / Stairs / Office manager / Balance Dynamic Sitting Balance Sitting balance - Comments: able to sit unsupported in upright posture for 2 min without LOB and pt able to self-correct with UE support and with trunk  control Balance Overall balance assessment: Needs assistance Sitting-balance support: No upper extremity supported, Feet unsupported Sitting balance-Leahy Scale: Fair Sitting balance - Comments: able to sit unsupported in upright posture for 2 min without LOB and pt able to self-correct with UE support and with trunk control    Special needs/care consideration BiPAP/CPAP n/a CPM  N/a Continuous Drip IV  N/a Dialysis ESRD on CCPD for 2 years. Dry dwell during the day and CCPD nightly that patient managed himself pta Life Vest  N/a Oxygen  N/a Special Bed  N/a Trach Size  N/a Wound Vac bilateral VACS to B BKA applied by surgeon Skin bilateral surgical incisions to BKA's Bowel mgmt:  continent Bladder mgmt: anuric Diabetic mgmt: Hgb A1c 7.8 Behavioral consideration  N/a Chemo/radiation  N/a Designated visitors are sister, Jorge [name] and daughter, Jorge [name]   Previous Home Environment  Living Arrangements: (moved in 2 months ago with sister and his daughter Engineer, maintenance)  Lives With: Family, Daughter(sister and his daughter) Available Help at Discharge: Family, Available 24 hours/day Type of Home: House Home Layout: Two level(patient lives in the basement) Alternate Level Stairs-Rails: (entrance to basement with one bump up step) Alternate Level Stairs-Number of Steps: to go to main level is a flight inside and 4 to 5 steps outside Home Access: Level entry Entrance Stairs-Number of Steps: level into basement Bathroom Shower/Tub: (tub shower on main level) Bathroom Toilet: Standard Bathroom Accessibility: Yes(1/2 bath in basement) How Accessible: Accessible via walker, Accessible via wheelchair [place]: No  Discharge Living Setting Plans for Discharge Living Setting: Lives with (comment)(sister and his daughter from [place] for past 2 months) Type of Home at Discharge: House Discharge Home Layout: Two level(patietn lives in basement of home with 1/2 bath and level en) Alternate  Level Stairs-Rails: (flight inside to main level and 4 to 5 steps outside to main) Discharge Home Access: Level entry Discharge Bathroom Shower/Tub: Tub/shower unit(on main level; 1/2 bath is in basement) Discharge Bathroom Toilet: Standard Discharge Bathroom Accessibility: Yes How Accessible: Accessible via wheelchair, Accessible via walker Does the patient have any problems obtaining your medications?: No  Social/Family/Support Systems Patient Roles: Parent Contact Information: sister Jorge [name] and daughter, Jorge [name] Anticipated Caregiver: sister, daughter and son who lives local Anticipated Caregiver's Contact Information: see above Caregiver Availability: 24/7 Discharge Plan Discussed with Primary Caregiver: Yes Is Caregiver In Agreement with Plan?: Yes Does Caregiver/Family  have Issues with Lodging/Transportation while Pt is in Rehab?: No  Goals/Additional Needs Patient/Family Goal for Rehab: Mod I PT and OT at wheelchair level Expected length of stay: ELOS 7 to 10 days Equipment Needs: CCPD peritoneal dialysis patient does himself at home Special Service Needs: CCPD nightly Pt/Family Agrees to Admission and willing to participate: Yes Program Orientation Provided & Reviewed with Pt/Caregiver Including Roles  & Responsibilities: Yes  Decrease burden of Care through IP rehab admission: n/a  Possible need for SNF placement upon discharge: n/a; Can not go to SNF on CCPD  Patient Condition: I have reviewed medical records from [facility] , spoken with CM, and patient. I met with patient at the bedside for inpatient rehabilitation assessment.  Patient will benefit from ongoing PT and OT, can actively participate in 3 hours of therapy a day 5 days of the week, and can make measurable gains during the admission.  Patient will also benefit from the coordinated team approach during an Inpatient Acute Rehabilitation admission.  The patient will receive intensive therapy as well as  Rehabilitation physician, nursing, social worker, and care management interventions.  Due to bladder management, bowel management, safety, skin/wound care, disease management, medication administration, pain management and patient education the patient requires 24 hour a day rehabilitation nursing.  The patient is currently min assist with mobility and basic ADLs.  Discharge setting and therapy post discharge at home with home health is anticipated.  Patient has agreed to participate in the Acute Inpatient Rehabilitation Program and will admit today.  Preadmission Screen Completed By:  Cleatrice Burke, 03/31/2019 12:14 PM ______________________________________________________________________   Discussed status with Dr. Dagoberto Ligas on  03/31/2019 at  35 and received approval for admission today.  Admission Coordinator:  Cleatrice Burke, RN, time  9509 Date  03/31/2019   Assessment/Plan: Diagnosis: 1. Does the need for close, 24 hr/day Medical supervision in concert with the patient's rehab needs make it unreasonable for this patient to be served in a less intensive setting? Yes 2. Co-Morbidities requiring supervision/potential complications: ESRD, B/L BKAs for gangrene, DM with A1c of 7.8, and hypotension, anemia and HTN 3. Due to bowel management, safety, skin/wound care, disease management, medication administration, pain management and patient education, does the patient require 24 hr/day rehab nursing? Yes 4. Does the patient require coordinated care of a physician, rehab nurse, PT, OT, and SLP to address physical and functional deficits in the context of the above medical diagnosis(es)? Yes Addressing deficits in the following areas: balance, endurance, strength, transferring, bathing, dressing, feeding, grooming and toileting 5. Can the patient actively participate in an intensive therapy program of at least 3 hrs of therapy 5 days a week? Yes 6. The potential for patient to make  measurable gains while on inpatient rehab is good 7. Anticipated functional outcomes upon discharge from inpatient rehab: modified independent and supervision PT, modified independent and supervision OT, n/a SLP 8. Estimated rehab length of stay to reach the above functional goals is: 7-10 days 9. Anticipated discharge destination: Home 10. Overall Rehab/Functional Prognosis: good   MD Signature:

## 2019-03-31 NOTE — Progress Notes (Signed)
Physical Therapy Treatment Patient Details Name: Jorge [name] MRN: [id number] DOB: 1970-06-13 Today's Date: 03/31/2019    History of Present Illness Pt is a 49 y/o male now s/p bilateral transtibial amputations secondary to gangrene. PMH including but not limited to DM, HTN and ESRD on PD.    PT Comments    Pt very motivated and eager to perform therapy. Pt able to recall HEP and only required minor cues for technique. Pt with improving sitting balance and transfers with continued need for assist to complete lateral transfer but improved tolerance without fatiguing as quickly this session. Will plan to attempt WC mobility next session. Pt remains appropriate for CIR.     Follow Up Recommendations  CIR     Equipment Recommendations  Wheelchair (measurements PT);Wheelchair cushion (measurements PT);Other (comment)    Recommendations for Other Services       Precautions / Restrictions Precautions Precautions: Fall Precaution Comments: wound VACs bilateral residual limbs, limb protectors Restrictions Weight Bearing Restrictions: Yes RLE Weight Bearing: Non weight bearing LLE Weight Bearing: Non weight bearing    Mobility  Bed Mobility Overal bed mobility: Needs Assistance Bed Mobility: Supine to Sit Rolling: Min guard   Supine to sit: Min guard     General bed mobility comments: pt able to roll bil for hip extension. Transition from supine to sitting without assist  Transfers Overall transfer level: Needs assistance   Transfers: Lateral/Scoot Transfers          Lateral/Scoot Transfers: Min assist General transfer comment: cues for anterior translation of trunk, cues for completeness of instruction for setup and min assist to fully clear hips onto surface with lateral transfer  Ambulation/Gait                 Stairs             Wheelchair Mobility    Modified Rankin (Stroke Patients Only)       Balance Overall balance assessment: Needs  assistance   Sitting balance-Leahy Scale: Fair                                      Cognition Arousal/Alertness: Awake/alert Behavior During Therapy: WFL for tasks assessed/performed Overall Cognitive Status: Within Functional Limits for tasks assessed                                        Exercises Amputee Exercises Hip Extension: AROM;Sidelying;Both;10 reps Hip ABduction/ADduction: AROM;Strengthening;Both;Seated;20 reps Knee Extension: AROM;Strengthening;Both;Seated;20 reps Chair Push Up: Both;10 reps;Seated;AROM    General Comments        Pertinent Vitals/Pain Pain Assessment: No/denies pain    Home Living                      Prior Function            PT Goals (current goals can now be found in the care plan section) Progress towards PT goals: Progressing toward goals    Frequency    Min 5X/week      PT Plan Current plan remains appropriate    Co-evaluation              AM-PAC PT "6 Clicks" Mobility   Outcome Measure  Help needed turning from your back to your side while in a flat bed without  using bedrails?: A Little Help needed moving from lying on your back to sitting on the side of a flat bed without using bedrails?: A Little Help needed moving to and from a bed to a chair (including a wheelchair)?: A Little Help needed standing up from a chair using your arms (e.g., wheelchair or bedside chair)?: Total Help needed to walk in hospital room?: Total Help needed climbing 3-5 steps with a railing? : Total 6 Click Score: 12    End of Session   Activity Tolerance: Patient tolerated treatment well Patient left: in chair;with call bell/phone within reach;with chair alarm set Nurse Communication: Mobility status PT Visit Diagnosis: Other abnormalities of gait and mobility (R26.89)     Time: 9144-4584 PT Time Calculation (min) (ACUTE ONLY): 21 min  Charges:  $Therapeutic Activity: 8-22 mins                      Carin Shipp P, PT Acute Rehabilitation Services Pager: [phone] Office: [place] 03/31/2019, 11:35 AM

## 2019-03-31 NOTE — Progress Notes (Signed)
Inpatient Rehabilitation Admissions Coordinator  I have insurance approval and bed at CIR today. I met with patient at bedside and he is in agreement. I notified Dr. [place] , [place] with Nephrology, RN CM, Levada Dy and Otila Kluver in hemodialysis. I will make the arrangements to admit today.  Danne Baxter, RN, MSN Rehab Admissions Coordinator [phone] 03/31/2019 11:16 AM

## 2019-03-31 NOTE — H&P (Addendum)
Physical Medicine and Rehabilitation Admission H&P    Chief Complaint  Patient presents with  . Functional decline due to B-BKA due to wound infections.     HPI: Jorge [name] is a 49 year old male with history of T2DM, HTN, ESRD--PD who was admitted on 03/19/2019 with ulcers bilateral feet with gangrenous changes.  He was found to be septic, had acute on chronic anemia and x-rays revealed osteomyelitis.  He was started on broad-spectrum antibiotics.    He had drop in hemoglobin to 5.4 on 3/12 and was transfused with 2 units PRBCs.  Nephrology following for management of CCPD.  He underwent bilateral BKA by Dr. Sharol Given on 03/25/2019.  To be NWB-BLE.  Pain control is improving with improvement in activity tolerance.  Therapy ongoing and CIR was recommended due to functional decline   Pt reports makes urine still- ~2x/day- on PD nightly- pain meds are working well, but needs to call for more in a minute.   BG was 39 this AM- Lantus was decreased; notes no phantom pain, but has phantom ITCHING.  Review of Systems  Constitutional: Negative for diaphoresis and fever.  Eyes: Negative for double vision and photophobia.  Respiratory: Negative.   Cardiovascular: Negative for chest pain.  Gastrointestinal: Positive for constipation. Negative for abdominal pain and diarrhea.  Musculoskeletal: Positive for joint pain and myalgias.  Neurological: Positive for weakness. Negative for tingling, tremors and speech change.  Psychiatric/Behavioral: The patient is nervous/anxious.   All other systems reviewed and are negative.     Past Medical History:  Diagnosis Date  . Diabetes mellitus without complication ([place])   . Hypertension   . Peritoneal dialysis status ([place])   . Renal disorder     Past Surgical History:  Procedure Laterality Date  . AMPUTATION Bilateral 03/25/2019   Procedure: BILATERAL BELOW KNEE AMPUTATION;  Surgeon: Newt Minion, MD;  Location: [facility];  Service: Orthopedics;   Laterality: Bilateral;  . FOOT SURGERY Left   . KNEE SURGERY Left     History reviewed. No pertinent family history.    Social History:  reports that he has never smoked. He has never used smokeless tobacco. He reports that he does not drink alcohol or use drugs.    Allergies: No Known Allergies    Medications Prior to Admission  Medication Sig Dispense Refill  . amLODipine (NORVASC) 10 MG tablet Take 10 mg by mouth daily.    Marland Kitchen aspirin EC 81 MG tablet Take 81 mg by mouth daily.    Marland Kitchen atorvastatin (LIPITOR) 20 MG tablet Take 20 mg by mouth daily.    . cloNIDine (CATAPRES) 0.1 MG tablet Take 0.1 mg by mouth at bedtime.    . ergocalciferol (VITAMIN D2) 1.25 MG (50000 UT) capsule Take 50,000 Units by mouth once a week.    . furosemide (LASIX) 80 MG tablet Take 80 mg by mouth daily.    . hydrALAZINE (APRESOLINE) 100 MG tablet Take 150 mg by mouth 2 (two) times daily.    . insulin glargine (LANTUS) 100 UNIT/ML injection Inject 10 Units into the skin at bedtime.    . insulin regular (NOVOLIN R,HUMULIN R) 100 units/mL injection Inject 3-13 Units into the skin 3 (three) times daily as needed for high blood sugar (per sliding scale).    Marland Kitchen metoCLOPramide (REGLAN) 5 MG tablet Take 5 mg by mouth every 6 (six) hours as needed for nausea.    Marland Kitchen RENVELA 800 MG tablet Take 2,400 mg by mouth 3 (three)  times daily.  6  . sodium bicarbonate 650 MG tablet Take 650 mg by mouth 3 (three) times daily.    . bisacodyl (DULCOLAX) 10 MG suppository Place 1 suppository (10 mg total) rectally as needed for moderate constipation. (Patient not taking: Reported on 03/19/2019) 12 suppository 0  . docusate sodium (COLACE) 100 MG capsule Take 1 capsule (100 mg total) by mouth every 12 (twelve) hours. (Patient not taking: Reported on 03/19/2019) 60 capsule 0  . ondansetron (ZOFRAN) 4 MG tablet Take 1 tablet (4 mg total) by mouth every 6 (six) hours. (Patient not taking: Reported on 03/19/2019) 12 tablet 0    Drug Regimen  Review  Drug regimen was reviewed and remains appropriate with no significant issues identified  Home: Home Living Family/patient expects to be discharged to:: Private residence Living Arrangements: Children, Other relatives Available Help at Discharge: Family, Available 24 hours/day Type of Home: House Home Access: Level entry Home Layout: Two level, Other (Comment) Bathroom Shower/Tub: Chiropodist: Standard Home Equipment: Toilet riser   Functional History: Prior Function Level of Independence: Independent  Functional Status:  Mobility: Bed Mobility Overal bed mobility: Needs Assistance Bed Mobility: Supine to Sit Rolling: Min guard Supine to sit: Min guard Sit to supine: Min guard General bed mobility comments: pt sitting in recliner upon arrival Transfers Overall transfer level: Needs assistance Equipment used: Rolling walker (2 wheeled) Transfers: Development worker, community transfers: +2 physical assistance, Mod assist  Lateral/Scoot Transfers: Min assist General transfer comment: deferred this session      ADL: ADL Overall ADL's : Needs assistance/impaired Eating/Feeding: Independent, Sitting Grooming: Set up, Sitting Upper Body Bathing: Supervision/ safety, Set up, Sitting Lower Body Bathing: Sitting/lateral leans, Moderate assistance Upper Body Dressing : Set up, Sitting Lower Body Dressing: Sitting/lateral leans, Moderate assistance Toilet Transfer: Minimal assistance, +2 for physical assistance, +2 for safety/equipment, Anterior/posterior, Cueing for safety Toileting- Clothing Manipulation and Hygiene: Minimal assistance, Sitting/lateral lean Functional mobility during ADLs: Minimal assistance, +2 for physical assistance, Cueing for safety General ADL Comments: session focused on UE HEP and general core strengthenign  Cognition: Cognition Overall Cognitive Status: Within Functional Limits for tasks  assessed Orientation Level: Oriented X4 Cognition Arousal/Alertness: Awake/alert Behavior During Therapy: WFL for tasks assessed/performed Overall Cognitive Status: Within Functional Limits for tasks assessed General Comments: pt is highly motivated to work with therapy and states "ill do whatever I have to to get stronger";educated pt on grieving process associated with amputations and educated him on desensitization strategies associated with phantom limb sensation and phantom limb pain  Physical Exam: Blood pressure (!) 120/45, pulse 72, temperature 98.7 F (37.1 C), temperature source Oral, resp. rate 16, height 5\' 11"  (1.803 m), weight 71 kg, SpO2 97 %. Physical Exam  Nursing note and vitals reviewed. Constitutional: He is oriented to person, place, and time. He appears well-developed. No distress.  Younger male, in tank top sitting on bedpan; daughter in room, NAD  HENT:  Head: Normocephalic and atraumatic.  Nose: Nose normal.  Mouth/Throat: Oropharynx is clear and moist. No oropharyngeal exudate.  No facial droop; tongue midline   Eyes: Conjunctivae are normal.  EOMI B/L; no nystagmus; no drainage from eyes B/L  Neck: No tracheal deviation present.  Cardiovascular:  RRR; no JVD  Respiratory: No stridor.  CTA B/L- good air movement-   GI:  Soft, NT; ND< (+)BS hyperactive- trying to have BM  Genitourinary:    Penis normal.   Musculoskeletal:     Cervical back: Normal range of motion  and neck supple.     Comments: 5/5 in UEs- in deltoids, biceps, triceps, WE, grip , but finger abduction was 4/5 on R and 3/5 on L Pt was surprised about that.  Wasn't able to test LEs since on bedpan, but in wound VACs B/L and shrinkers over wound VACs  Neurological: He is oriented to person, place, and time. Coordination normal.  Intact sensation to light touch in UEs and LEs- x4 extremities  Skin:  No skin breakdown seen, but unable to assess backside since on bedpan  Psychiatric: He has a  normal mood and affect. His behavior is normal.    Results for orders placed or performed during the hospital encounter of 03/19/19 (from the past 48 hour(s))  Glucose, capillary     Status: Abnormal   Collection Time: 03/29/19 11:55 AM  Result Value Ref Range   Glucose-Capillary 39 (LL) 70 - 99 mg/dL    Comment: Glucose reference range applies only to samples taken after fasting for at least 8 hours.   Comment 1 Notify RN   Glucose, capillary     Status: None   Collection Time: 03/29/19 12:19 PM  Result Value Ref Range   Glucose-Capillary 99 70 - 99 mg/dL    Comment: Glucose reference range applies only to samples taken after fasting for at least 8 hours.  Glucose, capillary     Status: Abnormal   Collection Time: 03/29/19  3:23 PM  Result Value Ref Range   Glucose-Capillary 154 (H) 70 - 99 mg/dL    Comment: Glucose reference range applies only to samples taken after fasting for at least 8 hours.  Glucose, capillary     Status: Abnormal   Collection Time: 03/29/19  9:06 PM  Result Value Ref Range   Glucose-Capillary 201 (H) 70 - 99 mg/dL    Comment: Glucose reference range applies only to samples taken after fasting for at least 8 hours.   Comment 1 Document in Chart   Glucose, capillary     Status: Abnormal   Collection Time: 03/30/19  6:56 AM  Result Value Ref Range   Glucose-Capillary 149 (H) 70 - 99 mg/dL    Comment: Glucose reference range applies only to samples taken after fasting for at least 8 hours.   Comment 1 Document in Chart   Glucose, capillary     Status: Abnormal   Collection Time: 03/30/19 11:10 AM  Result Value Ref Range   Glucose-Capillary 120 (H) 70 - 99 mg/dL    Comment: Glucose reference range applies only to samples taken after fasting for at least 8 hours.  Glucose, capillary     Status: Abnormal   Collection Time: 03/30/19  4:07 PM  Result Value Ref Range   Glucose-Capillary 131 (H) 70 - 99 mg/dL    Comment: Glucose reference range applies only to  samples taken after fasting for at least 8 hours.  Glucose, capillary     Status: Abnormal   Collection Time: 03/30/19  7:54 PM  Result Value Ref Range   Glucose-Capillary 67 (L) 70 - 99 mg/dL    Comment: Glucose reference range applies only to samples taken after fasting for at least 8 hours.  Glucose, capillary     Status: None   Collection Time: 03/30/19  8:35 PM  Result Value Ref Range   Glucose-Capillary 98 70 - 99 mg/dL    Comment: Glucose reference range applies only to samples taken after fasting for at least 8 hours.  Glucose, capillary  Status: Abnormal   Collection Time: 03/30/19 11:30 PM  Result Value Ref Range   Glucose-Capillary 126 (H) 70 - 99 mg/dL    Comment: Glucose reference range applies only to samples taken after fasting for at least 8 hours.  Glucose, capillary     Status: Abnormal   Collection Time: 03/31/19  3:51 AM  Result Value Ref Range   Glucose-Capillary 123 (H) 70 - 99 mg/dL    Comment: Glucose reference range applies only to samples taken after fasting for at least 8 hours.  Glucose, capillary     Status: Abnormal   Collection Time: 03/31/19  8:00 AM  Result Value Ref Range   Glucose-Capillary 45 (L) 70 - 99 mg/dL    Comment: Glucose reference range applies only to samples taken after fasting for at least 8 hours.  CBC     Status: Abnormal   Collection Time: 03/31/19  8:36 AM  Result Value Ref Range   WBC 12.8 (H) 4.0 - 10.5 K/uL   RBC 2.75 (L) 4.22 - 5.81 MIL/uL   Hemoglobin 8.0 (L) 13.0 - 17.0 g/dL   HCT 26.1 (L) 39.0 - 52.0 %   MCV 94.9 80.0 - 100.0 fL   MCH 29.1 26.0 - 34.0 pg   MCHC 30.7 30.0 - 36.0 g/dL   RDW 13.5 11.5 - 15.5 %   Platelets 255 150 - 400 K/uL   nRBC 0.0 0.0 - 0.2 %    Comment: Performed at [facility], [place] [address], [place], [place] [address]  Renal function panel     Status: Abnormal   Collection Time: 03/31/19  8:36 AM  Result Value Ref Range   Sodium 134 (L) 135 - 145 mmol/L   Potassium 4.3 3.5 - 5.1  mmol/L   Chloride 89 (L) 98 - 111 mmol/L   CO2 25 22 - 32 mmol/L   Glucose, Bld 44 (LL) 70 - 99 mg/dL    Comment: Glucose reference range applies only to samples taken after fasting for at least 8 hours. CRITICAL RESULT CALLED TO, READ BACK BY AND VERIFIED WITH: M PAYNE,RN 03/31/2019 1021 WILDERK    BUN 99 (H) 6 - 20 mg/dL   Creatinine, Ser 13.52 (H) 0.61 - 1.24 mg/dL   Calcium 8.7 (L) 8.9 - 10.3 mg/dL   Phosphorus 7.3 (H) 2.5 - 4.6 mg/dL   Albumin 1.8 (L) 3.5 - 5.0 g/dL   GFR calc non Af Amer 4 (L) >60 mL/min   GFR calc Af Amer 4 (L) >60 mL/min   Anion gap 20 (H) 5 - 15    Comment: Performed at [facility] [address] [place], [place] [address]  Glucose, capillary     Status: Abnormal   Collection Time: 03/31/19  8:44 AM  Result Value Ref Range   Glucose-Capillary 37 (LL) 70 - 99 mg/dL    Comment: Glucose reference range applies only to samples taken after fasting for at least 8 hours.   Comment 1 Notify RN   Glucose, capillary     Status: Abnormal   Collection Time: 03/31/19  9:33 AM  Result Value Ref Range   Glucose-Capillary 154 (H) 70 - 99 mg/dL    Comment: Glucose reference range applies only to samples taken after fasting for at least 8 hours.   No results found.     Medical Problem List and Plan: 1.  Impaired function/ADLs/mobility secondary to B/L BKAs due to osteomyelitis and gangrene- s/p IV ABX- completed 3/18  -  patient may not shower until wound VAC are removed; then LEs need to be covered  -ELOS/Goals: 10-14 days; supervision to Mod I 2.  Antithrombotics: -DVT/anticoagulation:  Pharmaceutical: Heparin due to PD/ESRD  -antiplatelet therapy:  3. Pain Management: Hydrocodone as needed- per pt, pain meds are suitable/working 4. Mood: LCSW to follow for evaluation and support  -antipsychotic agents:  5. Neuropsych: This patient is capable of making decisions on his own behalf. 6. Skin/Wound Care:  7. Fluids/Electrolytes/Nutrition: Strict I's and O's.   Monitor weights daily after PD. 8.  ESRD: Continue CCPD daily. Is oliguric, not anuric- voids ~2x/day 9.  Anemia of chronic disease: On ESA weekly.  H&H stable.  10.  Nutrition: Severe protein malnutrition-   Renal/carb modified diet to continue with prostat due to low calorie/protein malnutrition. 11. HTN: Monitor blood pressures twice daily.  Off hydralazine and furosemide at this time. 12.  T2DM: A1c 7.8- Continue Lantus daily--d/c meal coverage due to hypoglycemic episodes. Monitor blood sugars AC at bedtime for now due to hypoglycemia. 13. B/L BKAs- surgery done 3/17- so should be able to remove on 3/24 unless something occurs.  14. Phantom itching- might benefit from nerve pain meds- low dose.    Bary Leriche, PA-C 03/31/2019   I have personally performed a face to face diagnostic evaluation of this patient and formulated the key components of the plan.  Additionally, I have personally reviewed laboratory data, imaging studies, as well as relevant notes and concur with the physician assistant's documentation above.   The patient's status has not changed from the original H&P.  Any changes in documentation from the acute care chart have been noted above.

## 2019-03-31 NOTE — Progress Notes (Signed)
CRITICAL VALUE ALERT  Critical Value:  cbg 37  Date & Time Notied:  03/31/19 0930  Provider Notified: dr. [place]  Orders Received/Actions taken: gave sprite and pt cbg now at 154. Provider verbal one time order for 20u lantus instead of 25u. VSS/ asymptomatic. Will continue to monitor.

## 2019-03-31 NOTE — Progress Notes (Signed)
Patient arrived to unit about 1600. Patient is alert and able to make needs known. Report received about 1530, patient is bilateral amputee, with bilateral wound vac to BKA at 125. Patient is a PD Patient. Last BM 03/10/21. Patient has a LFA 20guage, moderate assist, uses a bedpan and urinal. NWB to bilateral legs

## 2019-04-01 ENCOUNTER — Inpatient Hospital Stay (HOSPITAL_COMMUNITY): Payer: Medicare (Managed Care)

## 2019-04-01 ENCOUNTER — Inpatient Hospital Stay (HOSPITAL_COMMUNITY): Payer: Medicare (Managed Care) | Admitting: Physical Therapy

## 2019-04-01 LAB — GLUCOSE, CAPILLARY
Glucose-Capillary: 153 mg/dL — ABNORMAL HIGH (ref 70–99)
Glucose-Capillary: 76 mg/dL (ref 70–99)
Glucose-Capillary: 84 mg/dL (ref 70–99)
Glucose-Capillary: 165 mg/dL — ABNORMAL HIGH (ref 70–99)

## 2019-04-01 MED ORDER — HYDROCODONE-ACETAMINOPHEN 5-325 MG PO TABS
1.0000 | ORAL_TABLET | ORAL | Status: DC | PRN
  Administered 2019-04-01 – 2019-04-02 (×3): 2 via ORAL
  Administered 2019-04-02: 1 via ORAL
  Administered 2019-04-03 – 2019-04-06 (×7): 2 via ORAL
  Administered 2019-04-12 – 2019-04-13 (×4): 1 via ORAL
  Administered 2019-04-16 – 2019-04-17 (×3): 2 via ORAL
  Administered 2019-04-19: 1 via ORAL
  Administered 2019-04-20 – 2019-04-22 (×4): 2 via ORAL
  Administered 2019-04-23 – 2019-04-24 (×2): 1 via ORAL
  Filled 2019-04-01: qty 1
  Filled 2019-04-01 (×2): qty 2
  Filled 2019-04-01: qty 1
  Filled 2019-04-01 (×2): qty 2
  Filled 2019-04-01: qty 1
  Filled 2019-04-01: qty 2
  Filled 2019-04-01: qty 1
  Filled 2019-04-01 (×2): qty 2
  Filled 2019-04-01: qty 1
  Filled 2019-04-01 (×6): qty 2
  Filled 2019-04-01 (×4): qty 1
  Filled 2019-04-01 (×2): qty 2
  Filled 2019-04-01: qty 1
  Filled 2019-04-01: qty 2
  Filled 2019-04-01: qty 1
  Filled 2019-04-01 (×2): qty 2

## 2019-04-01 MED ORDER — DELFLEX-LC/2.5% DEXTROSE 394 MOSM/L IP SOLN: INTRAPERITONEAL | Status: DC

## 2019-04-01 NOTE — Progress Notes (Signed)
Removed both wound vacs, patient tolerated well. Patient reported pain with removal, pain medication administered. No complications at wound vac site. Both sites are now cleaned and wrapped in ABD pad, kerlix and ace wrap. Jorge [name]

## 2019-04-01 NOTE — Progress Notes (Addendum)
Inpatient Rehabilitation Medication Review by a Pharmacist  A complete drug regimen review was completed for this patient to identify any potential clinically significant medication issues.  Clinically significant medication issues were identified:  yes   Type of Medication Issue Identified Description of Issue Urgent (address now) Non-Urgent (address on AM team rounds) Plan Plan Accepted by Provider? (Yes / No / Pending AM Rounds)  Additional Drug Therapy Needed  Discharge summary from 3/23 states to resume aspirin 81 mg however medication was not resumed once patient came to rehab.  Resume aspirin 81 mg daily Pending AM rounds    For non-urgent medication issues to be resolved on team rounds tomorrow morning a [facility] Secure Chat Handoff was sent to: Arturo Morton   Pharmacist comments:   Time spent performing this drug regimen review (minutes):  10    Mimi L. Devin Going, [place] PGY1 Pharmacy Resident [phone] 04/01/19      6:38 PM  Please check AMION for all [place] phone numbers After 10:00 PM, call the [facility] [phone]  04/03/2019 11:19 AM  Suggested reviewed by MD.  ASA resumed.  [employer], Pharm.D., BCPS Clinical Pharmacist  04/03/2019 11:19 AM

## 2019-04-01 NOTE — Progress Notes (Signed)
Inpatient Rehabilitation  Patient information reviewed and entered into eRehab system by Arli Bree M. Khoi Hamberger, M.A., CCC/SLP, PPS Coordinator.  Information including medical coding, functional ability and quality indicators will be reviewed and updated through discharge.    

## 2019-04-01 NOTE — Progress Notes (Signed)
Physical Therapy Session Note  Patient Details  Name: Jorge [name] MRN: [id number] Date of Birth: [birth year]  Today's Date: 04/01/2019 PT Individual Time: 1400-1500 PT Individual Time Calculation (min): 60 min   Short Term Goals: Week 1:  PT Short Term Goal 1 (Week 1): STG = LTG due to short ELOS.  Skilled Therapeutic Interventions/Progress Updates:     Patient in bed with B wound vacs in place and shrinkers donned with his son in the room upon PT arrival. Patient alert and agreeable to PT session. Patient reported 2/10 B residual limb pain during session, RN made aware. PT provided repositioning, rest breaks, and distraction as pain interventions throughout session.   Therapeutic Activity: Bed Mobility: Patient performed supine to/from sit and rolling supine to/from prone with supervision for safety/balance in a flat bed without use of bed rails. Provided cues for NWB through end of residual limbs. Transfers: Patient performed a slide board transfer bed<>w/c with close supervision-CGA for balance and max-total A for board placement. Provided cues for hand placement, board placement, and head-hips relationship for proper technique and decreased assist with transfers.   Wheelchair Mobility:  Patient propelled wheelchair ~185 feet x2 with supervision. Provided verbal cues for turning technique. Patient donned/doffed B limb pads with mod A and cues for technique and utilized breaks with mod cues throughout session. PT provided patient with w/c gloves and modified limb pads with towels to support knee extension and skin integrity in sitting.  Therapeutic Exercise: Patient performed the following exercises with verbal and tactile cues for proper technique. -B SLR 2x10 -B LAQ 2x10 -B hip extension in prone x10 -lying in prone x2 min for hip extension stretch, educated on performing prone lying 15 min/day for maintaining hip extension ROM  Educated patient on performing daily limb inspections  for monitoring heeling and looking for new wounds/sores using a hand-held mirror. Will continue education tomorrow when wound vacs removed. Also discussed BS management, as patient reported that his BS was low earlier today. Encouraged patient to eat full well balanced meals for improved performance in therapy sessions and reviewed signs and symptoms of hypoglycemia with patient.   PT and patient's son donned B limb guards at end of session. Provided demonstration and mod cues for placement. Educated patient on purpose of limb guards and management of skin integrity by doffing every few hours and repositioning.   Patient in bed with his son in the room at end of session with breaks locked, bed alarm set, and all needs within reach.    Therapy Documentation Precautions:  Precautions Precautions: Fall Precaution Comments: wound VACs bilateral residual limbs, limb protectors Restrictions Weight Bearing Restrictions: Yes RLE Weight Bearing: Non weight bearing LLE Weight Bearing: Non weight bearing    Therapy/Group: Individual Therapy  Brody Kump L Ellyssa Zagal PT, DPT  04/01/2019, 5:08 PM
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
## 2019-04-01 NOTE — Progress Notes (Signed)
Patient reports that he's worried that his sister could not understand him on the phone--he was slurring words and he has noticed it at times especially at night. Also reporting sometimes with thinking and tremors/tics bilateral hands occasionally. Discussed narcotics and fatigue as cause--especially build up/SE with HD patients.  Will d/c Neurontin as this could cause tics. Will decrease norco to 5/325 1-2 pills prn for moderate to severe pain to prevent SE. Nurse/therapy advised to monitor for now.
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
## 2019-04-01 NOTE — Progress Notes (Signed)
Patient ID: Jorge [name], male   DOB: [birth year], 49 y.o.   MRN: [id number]  [facility] Progress Note   Assessment/ Plan:   1. Gangrene/ bilateral osteomyelitis of the feet with severe PVD-s/p bilateral BKA  3/17. Now admitted to CIR for ongoing rehabilitation. 2. DM/Hyperglycemia: Encouraged meal intake/ONS and will follow CBGs. 3. End stage renal disease - Continue CCPD per home orders. Have been using 2.5% solution all exchanges and is euvolemic without concerning labs. I will decrease NaHCO3.  4. Hypertension/volume -BP well controlled on amlodipine startedhere on3/13 Continue PD with same orders.Now with revised EDW.  5. Anemia - Hgb improving and now 8.0. Had Mircera 225 mcg and Venofer 200 mg IV 03/18/2019. 3/12 tsat 23% ferritin 1688 CRP 15.9. High ferritin is inflammatory will repeat studies Due for ESA this week. Transfuse as indicated.  6. Metabolic bone disease -High phosphorus--binders recently adjusted and on renal diet. 7. Nutrition - Albumin low add prostat, renal vits.-Renal/carb modified diet. Alb very low - asked RD to see - needs more protein and kcal  Subjective:   Transferred to CIR yesterday.   Objective:   BP 126/71 (BP Location: Right Arm)   Pulse 69   Temp 98.1 F (36.7 C) (Oral)   Resp 17   Ht 5\' 11"  (1.803 m)   Wt 70.4 kg   SpO2 99%   BMI 21.65 kg/m   Physical Exam: Gen: comfortably resting in bed- just cleaned YYQ:MGNOI regular rhythm and normal rate, normal s1 and s2 Resp:Decreased breath sounds over bases, no rales/rhonchi BBC:WUGQ, flat, non-tender Ext:s/p bilateral BKA with stump protectors.  Labs: BMET Recent Labs  Lab 03/26/19 0748 03/27/19 0812 03/28/19 0404 03/31/19 0836  NA 135 134* 135 134*  K 4.2 4.2 4.0 4.3  CL 96* 94* 92* 89*  CO2 21* 23 23 25   GLUCOSE 332* 186* 244* 44*  BUN 74* 82* 88* 99*  CREATININE 12.43* 13.00* 13.50* 13.52*  CALCIUM 7.9* 8.1* 8.3* 8.7*  PHOS 7.2* 8.0*  --  7.3*    CBC Recent Labs  Lab 03/26/19 0748 03/27/19 0812 03/28/19 0404 03/31/19 0836  WBC 13.0* 13.0* 13.4* 12.8*  HGB 7.4* 7.6* 7.5* 8.0*  HCT 24.0* 23.8* 24.1* 26.1*  MCV 96.8 96.0 96.4 94.9  PLT 217 221 209 255      Medications:    . amLODipine  10 mg Oral Daily  . atorvastatin  20 mg Oral q1800  . calcitRIOL  0.5 mcg Oral Once per day on Mon Wed Fri  . cloNIDine  0.1 mg Oral QHS  . darbepoetin (ARANESP) injection - DIALYSIS  200 mcg Subcutaneous Q Wed-1800  . docusate sodium  100 mg Oral BID  . feeding supplement (PRO-STAT SUGAR FREE 64)  30 mL Oral TID BM  . gabapentin  100 mg Oral TID  . gentamicin cream  1 application Topical Daily  . heparin  5,000 Units Subcutaneous Q8H  . insulin aspart  0-5 Units Subcutaneous QHS  . insulin glargine  20 Units Subcutaneous Daily  . multivitamin  1 tablet Oral QHS  . sevelamer carbonate  2,400 mg Oral TID WC  . sodium bicarbonate  1,300 mg Oral TID   Elmarie Shiley, MD 04/01/2019, 9:57 AM

## 2019-04-02 ENCOUNTER — Inpatient Hospital Stay (HOSPITAL_COMMUNITY): Payer: Medicare (Managed Care)

## 2019-04-02 ENCOUNTER — Inpatient Hospital Stay (HOSPITAL_COMMUNITY): Payer: Medicare (Managed Care) | Admitting: Occupational Therapy

## 2019-04-02 DIAGNOSIS — Z89511 Acquired absence of right leg below knee: Secondary | ICD-10-CM

## 2019-04-02 DIAGNOSIS — Z89512 Acquired absence of left leg below knee: Secondary | ICD-10-CM

## 2019-04-02 LAB — GLUCOSE, CAPILLARY
Glucose-Capillary: 109 mg/dL — ABNORMAL HIGH (ref 70–99)
Glucose-Capillary: 111 mg/dL — ABNORMAL HIGH (ref 70–99)
Glucose-Capillary: 156 mg/dL — ABNORMAL HIGH (ref 70–99)
Glucose-Capillary: 256 mg/dL — ABNORMAL HIGH (ref 70–99)

## 2019-04-02 MED ORDER — ASPIRIN EC 81 MG PO TBEC
81.0000 mg | DELAYED_RELEASE_TABLET | Freq: Every day | ORAL | Status: DC
  Administered 2019-04-02 – 2019-04-24 (×23): 81 mg via ORAL
  Filled 2019-04-02 (×24): qty 1

## 2019-04-02 MED ORDER — HEPARIN 1000 UNIT/ML FOR PERITONEAL DIALYSIS
INTRAPERITONEAL | Status: DC | PRN
  Filled 2019-04-02: qty 5000

## 2019-04-02 MED ORDER — DELFLEX-LC/2.5% DEXTROSE 394 MOSM/L IP SOLN
INTRAPERITONEAL | Status: DC
  Administered 2019-04-02 – 2019-04-04 (×3): 5000 mL via INTRAPERITONEAL

## 2019-04-02 MED ORDER — DARBEPOETIN ALFA 200 MCG/0.4ML IJ SOSY
200.0000 ug | PREFILLED_SYRINGE | Freq: Once | INTRAMUSCULAR | Status: AC
  Administered 2019-04-02: 200 ug via SUBCUTANEOUS
  Filled 2019-04-02: qty 0.4

## 2019-04-02 MED ORDER — SEVELAMER CARBONATE 800 MG PO TABS
3200.0000 mg | ORAL_TABLET | Freq: Three times a day (TID) | ORAL | Status: DC
  Administered 2019-04-02 – 2019-04-24 (×59): 3200 mg via ORAL
  Filled 2019-04-02 (×65): qty 4

## 2019-04-02 MED ORDER — SODIUM CHLORIDE 0.9 % IV SOLN
250.0000 mg | Freq: Every day | INTRAVENOUS | Status: AC
  Administered 2019-04-02 – 2019-04-03 (×2): 250 mg via INTRAVENOUS
  Filled 2019-04-02 (×2): qty 20

## 2019-04-02 MED ORDER — GABAPENTIN 100 MG PO CAPS
100.0000 mg | ORAL_CAPSULE | Freq: Every day | ORAL | Status: DC
  Administered 2019-04-02 – 2019-04-11 (×10): 100 mg via ORAL
  Filled 2019-04-02 (×10): qty 1

## 2019-04-02 MED ORDER — DARBEPOETIN ALFA 200 MCG/0.4ML IJ SOSY: 200.0000 ug | PREFILLED_SYRINGE | INTRAMUSCULAR | Status: DC

## 2019-04-02 MED ORDER — DELFLEX-LC/1.5% DEXTROSE 344 MOSM/L IP SOLN
INTRAPERITONEAL | Status: DC
  Administered 2019-04-02 – 2019-04-04 (×3): 5000 mL via INTRAPERITONEAL

## 2019-04-02 NOTE — Hospital Course (Signed)
I saw the patient and agree with the above assessment and plan.     Doing well on PD.  Cont current Rx.

## 2019-04-02 NOTE — Progress Notes (Signed)
[facility] Progress Note   Dialysis Orders: [facility] CCPD 7 Days weekly 80 kg 4 exchanges 1.6 mls Dwell time 1.5 hr, No pause or day dwells. Uses mostly 2.5% solution  BMD meds: -Renvela 800 mg 3 tabs PO TID with meals -Calcitriol 0.5 mcg PO TIW (MWF) -   Assessment/Plan: 1. Gangrene/ bilateral Osteo feet with sever PVD-s/p bilateral BKA  3/17. Planning on CIR transfer. 2. DM/Hyperglycemia: Per primary -hypoglycemia - eating all meals. Per primary. 3. ESRD - Continue CCPD per home orders. Have been using 2.5% solution all exchanges- see below for change tonight.. Lasix stopped during acute admission.  d/t little urine output. On Na bicarb - dose reduced to 1 ac 3/23 - will see what Friday labs show - maybe can d/c 4. Hypertension/volume -BP well controlled.Notantihypertensive meds as OP. Amlodipine startedhere on3/13 Continue PD with same orders.Significantly under edw, down to 67.9kgon 3/19.Does not appear volume overload. Will need new edw at d/c. Net UF 2.3 3/21 1120 3/22- stump protectors adding to total weights - need to be taking into consideration/ Net UF 2.2 L with lower BP - plan alternate 2.5 and 1.5 tonight 5. Anemia - HGB initially 7.7 fell to 5.4 on3/12 and transfused two units PRBCs, up to 9.9 but now down to 7.5but trending up to 8 today 3/23. Marland KitchenHeme neg 3/11 Ferritin still increased post surgery to 1420 with drop in tsat to 15% - will give a short course of IV Fe (250 x 2) on Aranesp 200/ week - last given today. Transfuse as indicated.  repeat CBC Friday 6. Metabolic bone disease -CCa in goal.LastP ^ . Addedbinders w/snacks.- increase meal binders to 4 ac ContinueVDRA.  7. Nutrition - Albumin low add prostat, renal vits.-Renal/carb modified diet. Alb very low - feels like he is getting more to eat   Myriam Jacobson, PA-C [place] [phone] 04/02/2019,11:09 AM  LOS: 2 days   Subjective:    Eating well, Blood sugars ok.  No new  Issues or problems with PD  Objective Vitals:   04/01/19 1714 04/01/19 2016 04/02/19 0455 04/02/19 0700  BP: (!) 106/56 (!) 107/48 (!) 113/96 120/73  Pulse: 62 64 67 66  Resp: 14 19 19 18   Temp: 97.8 F (36.6 C) 97.7 F (36.5 C) (!) 97.4 F (36.3 C) 98.1 F (36.7 C)  TempSrc:  Oral Oral Oral  SpO2: 100% 96% 100% 100%  Weight: 70.7 kg   69 kg  Height: 4\' 8"  (1.422 m)      Physical Exam General:slender male NAD on room air sitting in WC Heart: RRR Lungs: no rales Abdomen: + BS soft  Extremities:bilateral BKA with VAC/stump protectors in place - Dialysis Access:  PD cath   Additional Objective Labs: Basic Metabolic Panel: Recent Labs  Lab 03/27/19 0812 03/28/19 0404 03/31/19 0836  NA 134* 135 134*  K 4.2 4.0 4.3  CL 94* 92* 89*  CO2 23 23 25   GLUCOSE 186* 244* 44*  BUN 82* 88* 99*  CREATININE 13.00* 13.50* 13.52*  CALCIUM 8.1* 8.3* 8.7*  PHOS 8.0*  --  7.3*   Liver Function Tests: Recent Labs  Lab 03/27/19 0812 03/31/19 0836  ALBUMIN 1.6* 1.8*   No results for input(s): LIPASE, AMYLASE in the last 168 hours. CBC: Recent Labs  Lab 03/27/19 0812 03/28/19 0404 03/31/19 0836  WBC 13.0* 13.4* 12.8*  HGB 7.6* 7.5* 8.0*  HCT 23.8* 24.1* 26.1*  MCV 96.0 96.4 94.9  PLT 221 209 255  Blood Culture    Component Value Date/Time   SDES BLOOD SITE NOT SPECIFIED 03/19/2019 2016   SPECREQUEST  03/19/2019 2016    BOTTLES DRAWN AEROBIC AND ANAEROBIC Blood Culture adequate volume   CULT  03/19/2019 2016    NO GROWTH 5 DAYS Performed at [facility], [place] [address], [place], [place] [address]    REPTSTATUS 03/24/2019 FINAL 03/19/2019 2016    Cardiac Enzymes: No results for input(s): CKTOTAL, CKMB, CKMBINDEX, TROPONINI in the last 168 hours. CBG: Recent Labs  Lab 04/01/19 0612 04/01/19 1124 04/01/19 1644 04/01/19 2124 04/02/19 0614  GLUCAP 153* 76 84 165* 109*   Iron Studies:  Recent Labs     03/31/19 0836  IRON 30*  TIBC 200*  FERRITIN 1,420*   No results found for: INR, PROTIME Studies/Results: No results found. Medications: . dialysis solution 2.5% low-MG/low-CA     . amLODipine  10 mg Oral Daily  . atorvastatin  20 mg Oral q1800  . calcitRIOL  0.5 mcg Oral Once per day on Mon Wed Fri  . cloNIDine  0.1 mg Oral QHS  . [START ON 04/08/2019] darbepoetin (ARANESP) injection - DIALYSIS  200 mcg Intravenous Q Wed-HD  . docusate sodium  100 mg Oral BID  . feeding supplement (PRO-STAT SUGAR FREE 64)  30 mL Oral TID BM  . gentamicin cream  1 application Topical Daily  . heparin  5,000 Units Subcutaneous Q8H  . insulin aspart  0-5 Units Subcutaneous QHS  . insulin glargine  20 Units Subcutaneous Daily  . multivitamin  1 tablet Oral QHS  . sevelamer carbonate  2,400 mg Oral TID WC  . sodium bicarbonate  1,300 mg Oral TID

## 2019-04-02 NOTE — Progress Notes (Signed)
Occupational Therapy Session Note  Patient Details  Name: MABEL UNREIN MRN: [id number] Date of Birth: 02-17-1970  Today's Date: 04/02/2019 OT Individual Time: 1100-1155 OT Individual Time Calculation (min): 55 min    Short Term Goals: Week 1:  OT Short Term Goal 1 (Week 1): Pt will transfer wiht CGA to BSC/toilet OT Short Term Goal 2 (Week 1): Pt will don pants wiht CGA using lateral leans OT Short Term Goal 3 (Week 1): Pt will manage pants prior to toileting wiht CGA  Skilled Therapeutic Interventions/Progress Updates:    Patient seated in w/c, alert and ready for therapy session.  He states that he "over did it" with UB theraband exercises on his own this morning and his arms are tired.  Completed w/c propulsion approx 50 feet x2.  Reviewed w/c management - requires min A for arm rest, placement of transfer board and amp pad management.. reviewed DME options for shower and drop arm commode,   Practiced SB transfer to tub bench with min A and min cues after initial instruction.  He will not have w/c access to bathroom upon return home and plans to sponge bathe initially.  Min a to doff and donn bilateral amp shields.  Completed SB transfer to bed with CGA.  He remained in bed at close of session, bed alarm set and call bell in reach.    Therapy Documentation Precautions:  Precautions Precautions: Fall Precaution Comments: wound VACs bilateral residual limbs, limb protectors Restrictions Weight Bearing Restrictions: Yes RLE Weight Bearing: Non weight bearing LLE Weight Bearing: Non weight bearing General:   Vital Signs: Therapy Vitals Temp: 98.1 F (36.7 C) Temp Source: Oral Pulse Rate: 66 Resp: 18 BP: 120/73 Patient Position (if appropriate): Lying Oxygen Therapy SpO2: 100 % O2 Device: Room Air  Therapy/Group: Individual Therapy  Carlos Levering 04/02/2019, 7:38 AM

## 2019-04-02 NOTE — Progress Notes (Signed)
[facility] PHYSICAL MEDICINE & REHABILITATION PROGRESS NOTE   Subjective/Complaints:  LBM 2 days ago- "normal for him".  Pain pretty well controlled.  Got wound VACs off yesterday- was painful, but doing better now- thinks it looks OK.    ROS:  Pt denies SOB, abd pain, CP, N/V/C/D, and vision changes  Objective:   No results found. Recent Labs    03/31/19 0836  WBC 12.8*  HGB 8.0*  HCT 26.1*  PLT 255   Recent Labs    03/31/19 0836  NA 134*  K 4.3  CL 89*  CO2 25  GLUCOSE 44*  BUN 99*  CREATININE 13.52*  CALCIUM 8.7*    Intake/Output Summary (Last 24 hours) at 04/02/2019 1022 Last data filed at 04/02/2019 0744 Gross per 24 hour  Intake 480 ml  Output 600 ml  Net -120 ml     Physical Exam: Vital Signs Blood pressure 120/73, pulse 66, temperature 98.1 F (36.7 C), temperature source Oral, resp. rate 18, height 4\' 8"  (1.422 m), weight 69 kg, SpO2 100 %.  Physical Exam Nursing noteand vitalsreviewed. Constitutional: awake, alert, sitting up in bed; appropriate, NAD HEENT: conjugate gaze Neck:No tracheal deviationpresent.  Cardiovascular:RRR; no JVD Respiratory: CTA B/L- good air movement GI:  Soft, NT; ND; (+)BS Genitourinary: Penisnormal.  Musculoskeletal:  Cervical back: Normal range of motionand neck supple.  Comments: 5/5 in UEs- in deltoids, biceps, triceps, WE, grip , but finger abduction was 4/5 on R and 3/5 on L Neurological: O3 Skin: B/L BKAs dog ears almost gone; staples intact; a little tiny fold in skin seen on L BKA, however otherwise incisions look great- no erythema, no drainage- had limb protectors and shrinkers on that were removed to assess wounds.  Psychiatric:Appropriate    Assessment/Plan: 1. Functional deficits secondary to B/L BKAs due to gangrene/osteomyelitis which require 3+ hours per day of interdisciplinary therapy in a comprehensive inpatient rehab setting.  Physiatrist is providing close team  supervision and 24 hour management of active medical problems listed below.  Physiatrist and rehab team continue to assess barriers to discharge/monitor patient progress toward functional and medical goals  Care Tool:  Bathing    Body parts bathed by patient: Right arm, Left arm, Chest, Abdomen, Face, Front perineal area, Right upper leg, Left upper leg   Body parts bathed by helper: Buttocks Body parts n/a: Right lower leg, Left lower leg   Bathing assist Assist Level: Minimal Assistance - Patient > 75%     Upper Body Dressing/Undressing Upper body dressing   What is the patient wearing?: Pull over shirt    Upper body assist Assist Level: Contact Guard/Touching assist    Lower Body Dressing/Undressing Lower body dressing      What is the patient wearing?: Underwear/pull up, Pants     Lower body assist Assist for lower body dressing: Moderate Assistance - Patient 50 - 74%     Toileting Toileting    Toileting assist Assist for toileting: Minimal Assistance - Patient > 75%     Transfers Chair/bed transfer  Transfers assist     Chair/bed transfer assist level: Minimal Assistance - Patient > 75%(slide board)     Locomotion Ambulation   Ambulation assist   Ambulation activity did not occur: Safety/medical concerns          Walk 10 feet activity   Assist  Walk 10 feet activity did not occur: Safety/medical concerns        Walk 50 feet activity   Assist Walk 50  feet with 2 turns activity did not occur: Safety/medical concerns         Walk 150 feet activity   Assist Walk 150 feet activity did not occur: Safety/medical concerns         Walk 10 feet on uneven surface  activity   Assist Walk 10 feet on uneven surfaces activity did not occur: Safety/medical concerns         Wheelchair     Assist Will patient use wheelchair at discharge?: Yes Type of Wheelchair: Manual    Wheelchair assist level: Supervision/Verbal  cueing Max wheelchair distance: 125 ft    Wheelchair 50 feet with 2 turns activity    Assist        Assist Level: Supervision/Verbal cueing   Wheelchair 150 feet activity     Assist      Assist Level: Minimal Assistance - Patient > 75%   Blood pressure 120/73, pulse 66, temperature 98.1 F (36.7 C), temperature source Oral, resp. rate 18, height 4\' 8"  (1.422 m), weight 69 kg, SpO2 100 %.  Medical Problem List and Plan: 1.Impaired function/ADLs/mobilitysecondary to B/L BKAs due to osteomyelitis and gangrene- s/p IV ABX- completed 3/18 -patient maynotshower until wound VAC are removed; then LEs need to be covered  3/25- wound VAC removed- cannot shower due to PD -ELOS/Goals: 10-14 days; supervision to Mod I 2. Antithrombotics: -DVT/anticoagulation:Pharmaceutical:Heparindue to PD/ESRD -antiplatelet therapy:  3. Pain Management:Hydrocodone as needed- per pt, pain meds are suitable/working 4. Mood: LCSW to follow for evaluation and support -antipsychotic agents:  5. Neuropsych: This patientiscapable of making decisions onhis own behalf. 6. Skin/Wound Care:  7. Fluids/Electrolytes/Nutrition:Strict I's and O's. Monitor weights daily after PD. 8.ESRD: Continue CCPD daily.Is oliguric, not anuric- voids ~2x/day  3/25- on PD nightly-  9.Anemia of chronic disease: On ESA weekly. H&H stable.  10.Nutrition: Severe protein malnutrition-Renal/carb modified diet to continue with prostat due to low calorie/proteinmalnutrition.'  3/25- asked nutriton to see due to very low Albumin 11. GDJ:MEQASTM blood pressures twice daily. Off hydralazine and furosemide at this time.  3/25- BP controlled- con't clonidine and Norvasc  12.T2DM: A1c 7.8-Continue Lantus daily--d/c meal coverage due to hypoglycemic episodes.Monitor blood sugars AC at bedtime for now due to hypoglycemia.   CBG (last 3)  Recent Labs     04/01/19 1644 04/01/19 2124 04/02/19 0614  GLUCAP 84 165* 109*   3/25- great control- no hypoglygemia noted- con't regimen 13. B/L BKAs- surgery done 3/17- so should be able to remove on 3/24 unless something occurs.  14. Phantom itching- might benefit from nerve pain meds- low dose.  3/25- improved some after wound VACs removed- will see if needs nerve pain meds 15. Leukocytosis  3/25- will recheck in AM since last checked was somewhat elevated at 12.8k   LOS: 2 days A FACE TO FACE EVALUATION WAS PERFORMED  Lenna Hagarty 04/02/2019, 10:22 AM

## 2019-04-02 NOTE — Progress Notes (Signed)
Occupational Therapy Session Note  Patient Details  Name: Jorge [name] MRN: [id number] Date of Birth: [birth year]  Today's Date: 04/02/2019 OT Individual Time: 1519-1600 OT Individual Time Calculation (min): 41 min    Short Term Goals: Week 1:  OT Short Term Goal 1 (Week 1): Pt will transfer wiht CGA to BSC/toilet OT Short Term Goal 2 (Week 1): Pt will don pants wiht CGA using lateral leans OT Short Term Goal 3 (Week 1): Pt will manage pants prior to toileting wiht CGA  Skilled Therapeutic Interventions/Progress Updates:    1:1. Pt received in bed agreeable to OT. Pt completes lateral scoot transfers EOB<>w/c<>EOM with CGA fading to S with VC for placing SB and maangement of w/c parts. Pt completes seated balance exercises using ball toss (chest, bounce, and overhead pass) 3x30 for sitting balance, BUE strength and endurance requried for EOB ADLs. Exited session with pt seated in bed, exit alarm on and call light in reach. RN alerted to LE pain.  Therapy Documentation Precautions:  Precautions Precautions: Fall Precaution Comments: wound VACs bilateral residual limbs, limb protectors Restrictions Weight Bearing Restrictions: Yes RLE Weight Bearing: Non weight bearing LLE Weight Bearing: Non weight bearing General:   Vital Signs: Therapy Vitals Temp: 97.6 F (36.4 C) Pulse Rate: 68 Resp: 18 BP: (!) 130/53 Patient Position (if appropriate): Sitting Oxygen Therapy SpO2: 99 % O2 Device: Room Air Pain:   ADL: ADL Grooming: Setup Where Assessed-Grooming: Sitting at sink Upper Body Bathing: Contact guard Where Assessed-Upper Body Bathing: Edge of bed Lower Body Bathing: Minimal assistance Where Assessed-Lower Body Bathing: Bed level Upper Body Dressing: Contact guard Where Assessed-Upper Body Dressing: Edge of bed Lower Body Dressing: Moderate assistance Where Assessed-Lower Body Dressing: Edge of bed Vision   Perception    Praxis   Exercises:   Other  Treatments:     Therapy/Group: Individual Therapy  Tonny Branch 04/02/2019, 4:11 PM

## 2019-04-02 NOTE — Progress Notes (Addendum)
Physical Therapy Session Note  Patient Details  Name: Jorge [name] MRN: [id number] Date of Birth: [birth year]  Today's Date: 04/02/2019 PT Individual Time: 0905-1002 and 1300-1330 PT Individual Time Calculation (min): 57 min and 30 min  Note addendum to add time for second session.   Short Term Goals: Week 1:  PT Short Term Goal 1 (Week 1): STG = LTG due to short ELOS.  Skilled Therapeutic Interventions/Progress Updates:     Session 1: Patient in bed with B limb guards and shrinkers donned upon PT arrival. Patient alert and agreeable to PT session. Patient denied pain during session.  Therapeutic Activity: Bed Mobility: Patient performed supine to long sit with supervision. In long sitting he doffed B limb guards with min A and B shrinkers with supervision. Educated on performing limb inspection with hand held mirror. Patient inspected both limbs using the mirror and agreed to perform this daily. B incisions healing well without drainage upon observation. He then donned B shrinkers with supervision and sat EOB with supervision. Provided verbal cues for NWB through residual limbs. B limb guards remained doffed for slide board transfers for safety/balance.  Transfers: Patient performed a slide board transfers bed>w/c and w/c<>mat table with CGA-close supervision and mod-min A for board placement. Provided cues for hand placement, board placement, and head-hips relationship for proper technique and decreased assist with transfers.   Wheelchair Mobility:  Patient propelled wheelchair ~150 feet x2 with supervision. Provided verbal cues for turning technique. Patient performed w/c set up for transfers with mod cues and donning/doffing limb pads with min A-supervision. Utilized breaks with min cues throughout session.  Therapeutic Exercise: Patient performed on set of the following exercises with an * by them from HEP handout provided with verbal and tactile cues for proper technique. Access  Code: [email or web address]:  *Supine Quad Set (BKA) - 1-2 x daily - 7 x weekly - 2 sets - 10 reps - 5 sec hold  Supine Gluteal Sets - 1-2 x daily - 7 x weekly - 2 sets - 10 reps - 5 sec hold  *Supine Active Straight Leg Raise - 1-2 x daily - 7 x weekly - 2 sets - 10 reps  *Supine Isometric Hip Adduction with Towel Roll (AKA) - 1-2 x daily - 7 x weekly - 2 sets - 10 reps - 5 sec hold  *Sidelying Hip Abduction (AKA) - 1-2 x daily - 7 x weekly - 2 sets - 10 reps  Prone Knee Flexion - 1-2 x daily - 7 x weekly - 2 sets - 10 reps  Prone Hip Extension with Residual Limb (BKA) - 1-2 x daily - 7 x weekly - 2 sets - 10 reps  Seated Knee Extension (BKA) - 1-2 x daily - 7 x weekly - 2 sets - 10 reps  Chair Push-Up (AKA) - 7 x weekly   Patient in w/c with B limb guards donned with min A at end of session with breaks locked, seat belt alarm set, and all needs within reach.   Session 2: Patient in bed with B limb guards and shrinkers donned upon PT arrival. Patient alert and agreeable to PT session. Patient reported feeling nauseas from lunch, RN made aware and provided medications and PT provided a diet ginger ale to sooth his stomach. Patient reported 1-2/10 residual limb pain at rest and 6-7/10 pain during A/P transfers during session, RN made aware. PT provided repositioning, rest breaks, and distraction as pain interventions throughout session. Following transfers assessed patient's R incision  and noted slightly open incision at lateral most end of the incision with staple pinching skin, RN made aware.   Therapeutic Activity: Bed Mobility: Patient performed supine to/from sit with supervision with use of bed rails to manage nausea with mobility. Provided verbal cues for NWB in B residual limbs. Transfers: Patient performed an A/P transfer bed<>w/c with min-mod A for lifting hips due to pain/fatigue. Provided cues for technique, hand placement, head hips relationship, and keeping B knees straight to avoid weight  bearing into the limb guards.   Patient doffed then donned B limb guards and shrinkers with min A-supervision to perform skin inspection to identify pain source following transfers, see observations above.  Patient in bed at end of session with breaks locked, bed alarm set, and all needs within reach.    Therapy Documentation Precautions:  Precautions Precautions: Fall Precaution Comments: wound VACs bilateral residual limbs, limb protectors Restrictions Weight Bearing Restrictions: Yes RLE Weight Bearing: Non weight bearing LLE Weight Bearing: Non weight bearing    Therapy/Group: Individual Therapy  Deaven Barron L Advith Martine PT, DPT  04/02/2019, 4:36 PM

## 2019-04-03 ENCOUNTER — Inpatient Hospital Stay (HOSPITAL_COMMUNITY): Payer: Medicare (Managed Care) | Admitting: Physical Therapy

## 2019-04-03 ENCOUNTER — Inpatient Hospital Stay (HOSPITAL_COMMUNITY): Payer: Medicare (Managed Care) | Admitting: Occupational Therapy

## 2019-04-03 LAB — CBC WITH DIFFERENTIAL/PLATELET
Abs Immature Granulocytes: 0.09 10*3/uL — ABNORMAL HIGH (ref 0.00–0.07)
Basophils Absolute: 0.1 10*3/uL (ref 0.0–0.1)
Basophils Relative: 1 %
Eosinophils Absolute: 0.4 10*3/uL (ref 0.0–0.5)
Eosinophils Relative: 4 %
HCT: 23.5 % — ABNORMAL LOW (ref 39.0–52.0)
Hemoglobin: 7.4 g/dL — ABNORMAL LOW (ref 13.0–17.0)
Immature Granulocytes: 1 %
Lymphocytes Relative: 21 %
Lymphs Abs: 2 10*3/uL (ref 0.7–4.0)
MCH: 30.2 pg (ref 26.0–34.0)
MCHC: 31.5 g/dL (ref 30.0–36.0)
MCV: 95.9 fL (ref 80.0–100.0)
Monocytes Absolute: 0.9 10*3/uL (ref 0.1–1.0)
Monocytes Relative: 9 %
Neutro Abs: 6.1 10*3/uL (ref 1.7–7.7)
Neutrophils Relative %: 64 %
Platelets: 216 10*3/uL (ref 150–400)
RBC: 2.45 MIL/uL — ABNORMAL LOW (ref 4.22–5.81)
RDW: 13.6 % (ref 11.5–15.5)
WBC: 9.4 10*3/uL (ref 4.0–10.5)
nRBC: 0 % (ref 0.0–0.2)

## 2019-04-03 LAB — RENAL FUNCTION PANEL
Albumin: 1.8 g/dL — ABNORMAL LOW (ref 3.5–5.0)
Anion gap: 19 — ABNORMAL HIGH (ref 5–15)
BUN: 112 mg/dL — ABNORMAL HIGH (ref 6–20)
CO2: 24 mmol/L (ref 22–32)
Calcium: 8.5 mg/dL — ABNORMAL LOW (ref 8.9–10.3)
Chloride: 88 mmol/L — ABNORMAL LOW (ref 98–111)
Creatinine, Ser: 13.27 mg/dL — ABNORMAL HIGH (ref 0.61–1.24)
GFR calc Af Amer: 4 mL/min — ABNORMAL LOW (ref >60)
GFR calc non Af Amer: 4 mL/min — ABNORMAL LOW (ref >60)
Glucose, Bld: 118 mg/dL — ABNORMAL HIGH (ref 70–99)
Phosphorus: 7.1 mg/dL — ABNORMAL HIGH (ref 2.5–4.6)
Potassium: 5 mmol/L (ref 3.5–5.1)
Sodium: 131 mmol/L — ABNORMAL LOW (ref 135–145)

## 2019-04-03 LAB — GLUCOSE, CAPILLARY
Glucose-Capillary: 122 mg/dL — ABNORMAL HIGH (ref 70–99)
Glucose-Capillary: 107 mg/dL — ABNORMAL HIGH (ref 70–99)
Glucose-Capillary: 142 mg/dL — ABNORMAL HIGH (ref 70–99)
Glucose-Capillary: 249 mg/dL — ABNORMAL HIGH (ref 70–99)

## 2019-04-03 MED ORDER — DARBEPOETIN ALFA 200 MCG/0.4ML IJ SOSY
200.0000 ug | PREFILLED_SYRINGE | INTRAMUSCULAR | Status: DC
  Administered 2019-04-09 – 2019-04-23 (×3): 200 ug via SUBCUTANEOUS
  Filled 2019-04-03 (×3): qty 0.4

## 2019-04-03 NOTE — Progress Notes (Addendum)
[facility] PHYSICAL MEDICINE & REHABILITATION PROGRESS NOTE   Subjective/Complaints:  Pt reports bowels working well- slept OK and pain controlled.  Doesn't report any specific issues, but tolerating PD and therapy well Wanted to see SW; and needs renagel changed to with meals- already was but reiterated needs to be given with meals.   ROS:   Pt denies SOB, abd pain, CP, N/V/C/D, and vision changes  Objective:   No results found. Recent Labs    04/03/19 0650  WBC 9.4  HGB 7.4*  HCT 23.5*  PLT 216   Recent Labs    04/03/19 0650  NA 131*  K 5.0  CL 88*  CO2 24  GLUCOSE 118*  BUN 112*  CREATININE 13.27*  CALCIUM 8.5*    Intake/Output Summary (Last 24 hours) at 04/03/2019 1645 Last data filed at 04/03/2019 1315 Gross per 24 hour  Intake 850 ml  Output 550 ml  Net 300 ml     Physical Exam: Vital Signs Blood pressure 136/64, pulse 69, temperature 97.7 F (36.5 C), resp. rate 19, height 4\' 8"  (1.422 m), weight 69.6 kg, SpO2 100 %.  Physical Exam Nursing noteand vitalsreviewed. Constitutional:awake, alert, sitting up in bed; detached from PD, NAD HEENT: conjugate gaze Neck:No tracheal deviationpresent.  Cardiovascular:- no JVD Respiratory: good air movement; no resp distress GI:  NT, ND Musculoskeletal:  Cervical back: Normal range of motionand neck supple.  Comments: 5/5 in UEs- in deltoids, biceps, triceps, WE, grip , but finger abduction was 4/5 on R and 3/5 on L Neurological: Ox3 Skin: seen 3/25- B/L BKAs dog ears almost gone; staples intact; a little tiny fold in skin seen on L BKA, however otherwise incisions look great- no erythema, no drainage- had limb protectors and shrinkers on that were removed to assess wounds.  Psychiatric:appropriate    Assessment/Plan: 1. Functional deficits secondary to B/L BKAs due to gangrene/osteomyelitis which require 3+ hours per day of interdisciplinary therapy in a comprehensive inpatient rehab  setting.  Physiatrist is providing close team supervision and 24 hour management of active medical problems listed below.  Physiatrist and rehab team continue to assess barriers to discharge/monitor patient progress toward functional and medical goals  Care Tool:  Bathing    Body parts bathed by patient: Right arm, Left arm, Chest, Abdomen, Face, Front perineal area, Right upper leg, Left upper leg   Body parts bathed by helper: Buttocks Body parts n/a: Right lower leg, Left lower leg   Bathing assist Assist Level: Minimal Assistance - Patient > 75%     Upper Body Dressing/Undressing Upper body dressing   What is the patient wearing?: Pull over shirt    Upper body assist Assist Level: Contact Guard/Touching assist    Lower Body Dressing/Undressing Lower body dressing      What is the patient wearing?: Underwear/pull up, Pants     Lower body assist Assist for lower body dressing: Moderate Assistance - Patient 50 - 74%     Toileting Toileting    Toileting assist Assist for toileting: Minimal Assistance - Patient > 75%     Transfers Chair/bed transfer  Transfers assist     Chair/bed transfer assist level: Supervision/Verbal cueing     Locomotion Ambulation   Ambulation assist   Ambulation activity did not occur: Safety/medical concerns          Walk 10 feet activity   Assist  Walk 10 feet activity did not occur: Safety/medical concerns        Walk 50 feet  activity   Assist Walk 50 feet with 2 turns activity did not occur: Safety/medical concerns         Walk 150 feet activity   Assist Walk 150 feet activity did not occur: Safety/medical concerns         Walk 10 feet on uneven surface  activity   Assist Walk 10 feet on uneven surfaces activity did not occur: Safety/medical concerns         Wheelchair     Assist Will patient use wheelchair at discharge?: Yes Type of Wheelchair: Manual    Wheelchair assist level:  Minimal Assistance - Patient > 75% Max wheelchair distance: 150'    Wheelchair 50 feet with 2 turns activity    Assist        Assist Level: Supervision/Verbal cueing   Wheelchair 150 feet activity     Assist      Assist Level: Supervision/Verbal cueing   Blood pressure 136/64, pulse 69, temperature 97.7 F (36.5 C), resp. rate 19, height 4\' 8"  (1.422 m), weight 69.6 kg, SpO2 100 %.  Medical Problem List and Plan: 1.Impaired function/ADLs/mobilitysecondary to B/L BKAs due to osteomyelitis and gangrene- s/p IV ABX- completed 3/18  3/25- wound VAC removed- cannot shower due  to PD -ELOS/Goals: 10-14 days; supervision to Mod I 2. Antithrombotics: -DVT/anticoagulation:Pharmaceutical:Heparindue to PD/ESRD -antiplatelet therapy:  3. Pain Management:Hydrocodone as needed- per pt, pain meds are suitable/working 4. Mood: LCSW to follow for evaluation and support -antipsychotic agents:  5. Neuropsych: This patientiscapable of making decisions onhis own behalf. 6. Skin/Wound Care:  7. Fluids/Electrolytes/Nutrition:Strict I's and O's. Monitor weights daily after PD. 8.ESRD: Continue CCPD daily.Is oliguric, not anuric- voids ~2x/day  3/26- on PD nightly- detached early in AM 9.Anemia of chronic disease: On ESA weekly. H&H stable.   3/26- Hb 7.4- dropped slightly- con't to monitor 10.Nutrition: Severe protein malnutrition-Renal/carb modified diet to continue with prostat due to low calorie/proteinmalnutrition.'  3/25- asked nutriton to see due to very low Albumin 11. NIO:EVOJJKK blood pressures twice daily. Off hydralazine and furosemide at this time.  3/25- BP controlled- con't clonidine and Norvasc  12.T2DM: A1c 7.8-Continue Lantus daily--d/c meal coverage due to hypoglycemic episodes.Monitor blood sugars AC at bedtime for now due to hypoglycemia.   CBG (last 3)  Recent Labs    04/03/19 0558 04/03/19 1153  04/03/19 1636  GLUCAP 122* 107* 142*   3/26- no hypoglycemia- well controlled 13. B/L BKAs- surgery done 3/17- so should be able to remove on 3/24 unless something occurs.  14. Phantom itching- might benefit from nerve pain meds- low dose.  3/25- improved some after wound VACs removed- will see if needs nerve pain meds 15. Leukocytosis  3/25- will recheck in AM since last checked was somewhat elevated at 12.8k  3/26- 9.4- doing better   LOS: 3 days A FACE TO FACE EVALUATION WAS PERFORMED  Arianni Gallego 04/03/2019, 4:45 PM

## 2019-04-03 NOTE — Progress Notes (Signed)
Patient's sister would like for the social worker to contact her, she has questions about the requirements of care, her name is Eldrick Penick and her number is [phone]

## 2019-04-03 NOTE — Progress Notes (Signed)
Physical Therapy Session Note  Patient Details  Name: Jorge [name] MRN: [id number] Date of Birth: Mar 30, 1970  Today's Date: 04/03/2019 PT Individual Time: 5366-4403 AND 1500-1525 PT Individual Time Calculation (min): 39 min AND 25 min  Short Term Goals: Week 1:  PT Short Term Goal 1 (Week 1): STG = LTG due to short ELOS.  Skilled Therapeutic Interventions/Progress Updates:   Session 1:  Pt received in w/c and agreeable to therapy, denies pain but reports significant bottom discomfort from sitting up in w/c. Pt self-propelled w/c to therapy gym w/ BUEs, supervision. Performed slide board transfer to edge of mat w/ min assist to hold board in place, pt performed transfer set-up and board placement w/o assist. Supervision bed mobility and performed BLE strengthening exercises as listed below. Tactile and verbal cues for technique, esp w/ hip abduction in sidelying. AP transfer to w/c w/ min assist. Returned to room via w/c, AP transfer to EOB w/ mod assist. Ended session in supine, all needs in reach.   BLE strengthening: -alternating knee to chest, 3x10 -glut squeeze/isometric hip extension, 3x10  -sidelying hip abduction, 2x10 -adduction squeezes, 2x10  Session 2: Pt in supine and agreeable to therapy, denies pain but reports significant BUE soreness. Pt managed residual limb protectors w/o assist from therapist. Performed BLE strengthening/ROM exercises as listed below. This included laying in prone for 10+ minutes to work on B hip flexor stretching. Pt tolerated prone positioning well and is able to roll w/ supervision. Amputee education regarding desensitization techniques, ROM/stretching, and frequent reminders to not push up through residual limbs when rolling in bed. Pt verbalized understanding and appreciative of all edu. Ended session in supine, all needs in reach.   BLE strengthening/ROM: -knee flexion in prone, 3x10 -prone hip extension, 3x10    Therapy  Documentation Precautions:  Precautions Precautions: Fall Precaution Comments: wound VACs bilateral residual limbs, limb protectors Restrictions Weight Bearing Restrictions: Yes RLE Weight Bearing: Non weight bearing LLE Weight Bearing: Non weight bearing Pain: Pain Assessment Pain Scale: 0-10 Pain Score: 3  Pain Type: Surgical pain Pain Location: Leg Pain Orientation: Right Pain Descriptors / Indicators: Throbbing Pain Frequency: Intermittent Pain Onset: Gradual Patients Stated Pain Goal: 2 Pain Intervention(s): Medication (See eMAR);Repositioned  Therapy/Group: Individual Therapy  Drue Harr Clent Demark 04/03/2019, 11:55 AM

## 2019-04-03 NOTE — IPOC Note (Signed)
Overall Plan of Care [facility]) Patient Details Name: Jorge [name] MRN: [id number] DOB: [birth year]  Admitting Diagnosis: S/P BKA (below knee amputation) bilateral [facility])  Hospital Problems: Principal Problem:   S/P BKA (below knee amputation) bilateral ([name]) Active Problems:   ESRD (end stage renal disease) ([place])   Insulin-requiring or dependent type II diabetes mellitus ([place])   ESRD on peritoneal dialysis ([place])   Severe protein-calorie malnutrition ([place])   Below knee amputation ([place])     Functional Problem List: Nursing    PT Balance, Safety, Edema, Sensory, Endurance, Skin Integrity, Motor, Pain  OT Balance, Edema, Endurance, Motor, Pain, Perception, Safety, Sensory, Skin Integrity  SLP    TR         Basic ADL's: OT Grooming, Bathing, Dressing, Toileting     Advanced  ADL's: OT       Transfers: PT Bed Mobility, Bed to Chair, Furniture, Teacher, early years/pre, Metallurgist: PT Emergency planning/management officer, Ambulation     Additional Impairments: OT None  SLP        TR      Anticipated Outcomes Item Anticipated Outcome  Self Feeding no goal  Swallowing      Basic self-care  Set up  Toileting  S/set up   [employer] Transfers supervision  Bowel/Bladder     Transfers  supervision  Locomotion  mod I w/c level  Communication     Cognition     Pain     Safety/Judgment      Therapy Plan: PT Intensity: Minimum of 1-2 x/day ,45 to 90 minutes PT Frequency: 5 out of 7 days PT Duration Estimated Length of Stay: 7-10 days OT Intensity: Minimum of 1-2 x/day, 45 to 90 minutes OT Frequency: 5 out of 7 days OT Duration/Estimated Length of Stay: 10-12     Due to the current state of emergency, patients may not be receiving their 3-hours of Medicare-mandated therapy.   Team Interventions: Nursing Interventions    PT interventions Community reintegration, DME/adaptive equipment instruction, Neuromuscular re-education, Psychosocial support, UE/LE Strength  taining/ROM, Wheelchair propulsion/positioning, UE/LE Coordination activities, Therapeutic Activities, Skin care/wound management, Pain management, Discharge planning, Balance/vestibular training, Disease management/prevention, Functional mobility training, Therapeutic Exercise, Splinting/orthotics, Patient/family education, Visual/perceptual remediation/compensation  OT Interventions Balance/vestibular training, Discharge planning, Pain management, Self Care/advanced ADL retraining, Therapeutic Activities, UE/LE Coordination activities, Disease mangement/prevention, Functional mobility training, Patient/family education, Skin care/wound managment, Therapeutic Exercise, Wheelchair propulsion/positioning, UE/LE Strength taining/ROM, Splinting/orthotics, Psychosocial support, DME/adaptive equipment instruction, Community reintegration, Neuromuscular re-education  SLP Interventions    TR Interventions    SW/CM Interventions Discharge Planning, Patient/Family Education, Psychosocial Support   Barriers to Discharge MD  Medical stability, Wound care, Lack of/limited family support, Weight, Hemodialysis and Weight bearing restrictions  Nursing      PT Lack of/limited family support, Hemodialysis unsure if family can provide supervision at d/c  OT Inaccessible home environment, Home environment access/layout    SLP      SW       Team Discharge Planning: Destination: PT-Home ,OT- Home , SLP-  Projected Follow-up: PT-Home health PT, OT-  Home health OT, SLP-  Projected Equipment Needs: PT-Wheelchair cushion (measurements), Wheelchair (measurements), OT-  , SLP-  Equipment Details: PT-B amputee support pads, OT-DAC Patient/family involved in discharge planning: PT- Patient,  OT-Patient, SLP-   MD ELOS: 7-10 days Medical Rehab Prognosis:  Good Assessment: Pt is a 49 yr old male with ESRD on PD, s/p B/L BKAs due to gangrene/osteomyelitis as well as DM which  is better controlled- s/p wound VACs.    Goals supervision at w/c level    See Team Conference Notes for weekly updates to the plan of care

## 2019-04-03 NOTE — Care Management (Signed)
Inpatient [facility] Individual Statement of Services  Patient Name:  Jorge [name]  Date:  04/03/2019  Welcome to the [facility].  Our goal is to provide you with an individualized program based on your diagnosis and situation, designed to meet your specific needs.  With this comprehensive rehabilitation program, you will be expected to participate in at least 3 hours of rehabilitation therapies Monday-Friday, with modified therapy programming on the weekends.  Your rehabilitation program will include the following services:  Physical Therapy (PT), Occupational Therapy (OT), 24 hour per day rehabilitation nursing, Therapeutic Recreaction (TR), Psychology, Neuropsychology, Case Management (Social Worker), Rehabilitation Medicine, Nutrition Services, Pharmacy Services and Other  Weekly team conferences will be held on Tuesdays to discuss your progress.  Your Social Worker will talk with you frequently to get your input and to update you on team discussions.  Team conferences with you and your family in attendance may also be held.  Expected length of stay: 7-10 days    Overall anticipated outcome: Supervision  Depending on your progress and recovery, your program may change. Your Social Worker will coordinate services and will keep you informed of any changes. Your Social Worker's name and contact numbers are listed  below.  The following services may also be recommended but are not provided by the [facility] will be made to provide these services after discharge if needed.  Arrangements include referral to agencies that provide these services.  Your insurance has been verified to be:  [employer] of [place]  Your primary doctor is:  No PCP  Pertinent information will be shared with your  doctor and your insurance company.  Social Worker:  Loralee Pacas, LCSWA  Information discussed with and copy given to patient by: Rana Snare, 04/03/2019, 4:49 PM

## 2019-04-03 NOTE — Progress Notes (Signed)
Physical Therapy Session Note  Patient Details  Name: Jorge [name] MRN: [id number] Date of Birth: 1970/03/29  Today's Date: 04/03/2019 PT Individual Time: 1350-1444 PT Individual Time Calculation (min): 54 min   Short Term Goals: Week 1:  PT Short Term Goal 1 (Week 1): STG = LTG due to short ELOS.  Skilled Therapeutic Interventions/Progress Updates:  Pt received in bed & agreeable to tx. Therapist switched out R amputee pad for increased ease of use as current one would not latch well on w/c. Pt transfers to sitting EOB with supervision & therapist provides assist for w/c positioning with pt able to manage armrest with cuing and complete slide board bed>w/c with close supervision with pt setting up board without assist. Pt propels w/c room>ortho gym with BUE & supervision with 3 rest breaks 2/2 B shoulder soreness & fatigue. Pt utilized BUE ergometer on level 3 x 5 minutes forwards + 5 minutes backwards with focus on BUE strengthening & cardiopulmonary endurance training; pt with significantly decreased speed of movement 2/2 BUE fatigue/soreness. Educated pt on pressure relief techniques (lateral leans vs w/c pushups) & need to perform them every 20-30 minutes to prevent skin breakdown while sitting up in w/c with pt return demonstrating lateral leans. Pt returned to room & pt is able to set up w/c & manage all w/c parts without assistance, set up slide board & complete slide board transfer w/c>bed with close supervision. Pt dons B limb guards with set up assist. At end of session pt left in bed with alarm set, all needs in reach, NT in room.  Pain: pt reports 7/10 pain in BLE at end of session, requested pain meds from nurse  Therapy Documentation Precautions:  Precautions Precautions: Fall Precaution Comments: wound VACs bilateral residual limbs, limb protectors Restrictions Weight Bearing Restrictions: Yes RLE Weight Bearing: Non weight bearing LLE Weight Bearing: Non weight  bearing   Therapy/Group: Individual Therapy  Waunita Schooner 04/03/2019, 2:48 PM

## 2019-04-03 NOTE — Progress Notes (Signed)
Occupational Therapy Session Note  Patient Details  Name: Jorge [name] MRN: [id number] Date of Birth: March 31, 1970  Today's Date: 04/03/2019 OT Individual Time: 0900-1014 OT Individual Time Calculation (min): 74 min   Short Term Goals: Week 1:  OT Short Term Goal 1 (Week 1): Pt will transfer wiht CGA to BSC/toilet OT Short Term Goal 2 (Week 1): Pt will don pants wiht CGA using lateral leans OT Short Term Goal 3 (Week 1): Pt will manage pants prior to toileting wiht CGA  Skilled Therapeutic Interventions/Progress Updates:    Pt greeted in bed with no c/o pain. ADL needs met but he did want to start session by brushing his teeth. Bed mobility completed unassisted. Min-mod vcs for safe setup of w/c in prep for slideboard transfer including lifting armrest, bringing w/c closer to him, and locking both brakes. Pt able to place board and scoot across with OT only stabilizing the w/c (going uphill). Next worked on pt retrieving B limb rests from the floor, vcs for locking w/c brakes prior to bending forward. He needed assist to clip both limb rest mechanisms and Mod A for applying the Rt limb rest. Pt able to self propel the w/c to his toiletry bag on the recliner, navigating in a small space to do so. Pt completed oral care Mod I while sitting at the sink afterwards. His goal for the session was to work on w/c mobility and endurance. To address his goals, pt self propelled down to the atrium area, maneuvering in and out of elevators and several community environments such as the gift shop, food court, and outdoor patio. Pt needed min vcs, periodic rest breaks, and increased time when maneuvering around small spaces and natural obstacles. Increased challenge with propulsion over uneven surfaces outdoors. He completed a slideboard transfer to an armless seat with less firmness compared to a bed or gym mat. Pt with 1 full posterior LOB during transfer and needed Min A for recovery. When transferring back to the  w/c after, pt able to self direct transfer setup with questioning cues as if he were instructing a caregiver at home, then scooting uphill during transfer with CGA. We discussed how differences in firmness of transfer surfaces at home affects difficultly of transfer. Pt reported wanting to practice couch and bed transfers during next PT sessions to improve his functional independence with this. OT then escorted pt back to room where he removed both shrinker socks and inspected incision sites using [place] mirror. Pt then donned shrinker socks and also B limb guards, Min A for limb guards. At end of session pt remained in the w/c with all needs within reach.       Therapy Documentation Precautions:  Precautions Precautions: Fall Precaution Comments: wound VACs bilateral residual limbs, limb protectors Restrictions Weight Bearing Restrictions: Yes RLE Weight Bearing: Non weight bearing LLE Weight Bearing: Non weight bearing Vital Signs: Therapy Vitals Temp: 97.7 F (36.5 C) Pulse Rate: 69 Resp: 19 BP: 136/64 Patient Position (if appropriate): Lying Oxygen Therapy SpO2: 100 % O2 Device: Room Air ADL: ADL Grooming: Setup Where Assessed-Grooming: Sitting at sink Upper Body Bathing: Contact guard Where Assessed-Upper Body Bathing: Edge of bed Lower Body Bathing: Minimal assistance Where Assessed-Lower Body Bathing: Bed level Upper Body Dressing: Contact guard Where Assessed-Upper Body Dressing: Edge of bed Lower Body Dressing: Moderate assistance Where Assessed-Lower Body Dressing: Edge of bed      Therapy/Group: Individual Therapy  Skeet Simmer 04/03/2019, 4:37 PM

## 2019-04-03 NOTE — Progress Notes (Signed)
[facility] Progress Note   Dialysis Orders: [facility] CCPD 7 Days weekly 80 kg 4 exchanges 1.6 mls Dwell time 1.5 hr, No pause or day dwells. Uses mostly 2.5% solution  BMD meds: -Renvela 800 mg 3 tabs PO TID with meals -Calcitriol 0.5 mcg PO TIW (MWF) -   Assessment/Plan: 1. Gangrene/ bilateral Osteo feet with sever PVD-s/p bilateral BKA  3/17. In CIR. . 2. DM/Hyperglycemia: Per primary -hypoglycemia - eating all meals. Per primary. 3. ESRD - Continue CCPD per home orders. Using 1.5/2.5% solution.  K 5.0.  HCO3 24, stop PO NaHCO3.  BUN 112 and SCr 13 on 3/26.  Dry during day, probably little/no RRF, will d/w starting pause or adding day fill for improved clearance and labs. 4. Hypertension/volume -BP well controlled.Notantihypertensive meds as OP. Amlodipine startedhere on3/13 Continue PD with same orders.Significantly under edw, down to 67.9kgon 3/19.Does not appear volume overload. Will need new edw at d/c. 5. Anemia - HGB initially 7.7 fell to 5.4 on3/12 and transfused two units PRBCs, up to 9.9 but now down to 7.4 on 3/26 .Heme neg 3/11 Ferritin still increased post surgery to 1420 with drop in tsat to 15% - will give a short course of IV Fe (250 x 2) on Aranesp 200/ week - last given qThurs. Transfuse as indicated. CTM.  6. Metabolic bone disease -CCa in goal.LastP ^ . Addedbinders w/snacks.- increased meal binders to 4 ac ContinueVDRA.  7. Nutrition - Albumin low add prostat, renal vits.-Renal/carb modified diet. Alb very low -  Push protein  Rexene Agent  [facility] 04/03/2019,11:43 AM  LOS: 3 days   Subjective:    No interval events, 1.8L with PD overnight BUN > 100 K 5.0  Objective Vitals:   04/02/19 1353 04/02/19 1939 04/03/19 0500 04/03/19 0721  BP: (!) 130/53 109/84 (!) 116/50 134/68  Pulse: 68 69 69 68  Resp: 18   18  Temp: 97.6 F (36.4 C) 97.9 F (36.6 C) 97.8 F (36.6 C) 98.3 F (36.8 C)   TempSrc:   Oral Oral  SpO2: 99% 100% 99% 100%  Weight:    69.6 kg  Height:       Physical Exam General:slender male NAD on room air sitting in WC Heart: RRR Lungs: no rales Abdomen: + BS soft  Extremities:bilateral BKA with VAC/stump protectors in place - Dialysis Access:  PD cath   Additional Objective Labs: Basic Metabolic Panel: Recent Labs  Lab 03/28/19 0404 03/31/19 0836 04/03/19 0650  NA 135 134* 131*  K 4.0 4.3 5.0  CL 92* 89* 88*  CO2 23 25 24   GLUCOSE 244* 44* 118*  BUN 88* 99* 112*  CREATININE 13.50* 13.52* 13.27*  CALCIUM 8.3* 8.7* 8.5*  PHOS  --  7.3* 7.1*   Liver Function Tests: Recent Labs  Lab 03/31/19 0836 04/03/19 0650  ALBUMIN 1.8* 1.8*   No results for input(s): LIPASE, AMYLASE in the last 168 hours. CBC: Recent Labs  Lab 03/28/19 0404 03/31/19 0836 04/03/19 0650  WBC 13.4* 12.8* 9.4  NEUTROABS  --   --  6.1  HGB 7.5* 8.0* 7.4*  HCT 24.1* 26.1* 23.5*  MCV 96.4 94.9 95.9  PLT 209 255 216   Blood Culture    Component Value Date/Time   SDES BLOOD SITE NOT SPECIFIED 03/19/2019 2016   SPECREQUEST  03/19/2019 2016    BOTTLES DRAWN AEROBIC AND ANAEROBIC Blood Culture adequate volume   CULT  03/19/2019 2016    NO GROWTH 5  DAYS Performed at [facility], [place] [address], [place], [place] [address]    REPTSTATUS 03/24/2019 FINAL 03/19/2019 2016    Cardiac Enzymes: No results for input(s): CKTOTAL, CKMB, CKMBINDEX, TROPONINI in the last 168 hours. CBG: Recent Labs  Lab 04/02/19 0614 04/02/19 1142 04/02/19 1625 04/02/19 2056 04/03/19 0558  GLUCAP 109* 111* 156* 256* 122*   Iron Studies:  No results for input(s): IRON, TIBC, TRANSFERRIN, FERRITIN in the last 72 hours. No results found for: INR, PROTIME Studies/Results: No results found. Medications: . dialysis solution 1.5% low-MG/low-CA    . dialysis solution 2.5% low-MG/low-CA     . amLODipine  10 mg Oral Daily  . aspirin EC  81 mg Oral Daily  . atorvastatin  20  mg Oral q1800  . calcitRIOL  0.5 mcg Oral Once per day on Mon Wed Fri  . cloNIDine  0.1 mg Oral QHS  . [START ON 04/09/2019] darbepoetin (ARANESP) injection - NON-DIALYSIS  200 mcg Subcutaneous Q Thu-1800  . docusate sodium  100 mg Oral BID  . feeding supplement (PRO-STAT SUGAR FREE 64)  30 mL Oral TID BM  . gabapentin  100 mg Oral QHS  . gentamicin cream  1 application Topical Daily  . heparin  5,000 Units Subcutaneous Q8H  . insulin aspart  0-5 Units Subcutaneous QHS  . insulin glargine  20 Units Subcutaneous Daily  . multivitamin  1 tablet Oral QHS  . sevelamer carbonate  3,200 mg Oral TID WC  . sodium bicarbonate  1,300 mg Oral TID

## 2019-04-04 DIAGNOSIS — D649 Anemia, unspecified: Secondary | ICD-10-CM | POA: Insufficient documentation

## 2019-04-04 DIAGNOSIS — D638 Anemia in other chronic diseases classified elsewhere: Secondary | ICD-10-CM | POA: Insufficient documentation

## 2019-04-04 DIAGNOSIS — R7309 Other abnormal glucose: Secondary | ICD-10-CM | POA: Insufficient documentation

## 2019-04-04 DIAGNOSIS — D631 Anemia in chronic kidney disease: Secondary | ICD-10-CM | POA: Insufficient documentation

## 2019-04-04 LAB — GLUCOSE, CAPILLARY
Glucose-Capillary: 134 mg/dL — ABNORMAL HIGH (ref 70–99)
Glucose-Capillary: 92 mg/dL (ref 70–99)
Glucose-Capillary: 112 mg/dL — ABNORMAL HIGH (ref 70–99)
Glucose-Capillary: 146 mg/dL — ABNORMAL HIGH (ref 70–99)

## 2019-04-04 NOTE — Progress Notes (Deleted)
Pt had 3 visitors (one came in unaware), this RN asked them to leave and said would come back in 5 minutes to assist pt back into bed. Heard raised voices and yelling a minute later, this RN came back in and girlfriend and patient were yelling at each other. Girlfriend tried to leave, pt sitting in wheelchair between girlfriend and door and patient grabbed girlfriend's arm/coat pulling her back as she struggled to reach door. Girlfriend left room and walked into hallway, this RN called security. Girlfriend went back into room and more arguing. Patient still sitting in wheelchair and had leg rest in his hand waving it around like a baseball bat. More raised voiced and threats from both girlfriend and patient. 2 other visitors in room speaking but physically uninvolved. Girlfriend left again, throwing keys into patients room. 4 Security officers arrived, and asked other 2 visitors (cousins?) to leave. [place] visitors a little longer then they also left without issue. Security reinforced to patient 2 visitor max and 8pm end of visitation hours and stayed until this RN could supervise pt getting into bed. Patient calmed down and polite with staff. Patient mentioned not wanting any visitors the rest of the stay (due to d/c Tuesday). Will follow up with patient later to confirm without heat from argument. John, House Supervisor [facility]), said girlfriend restricted to visit during rest of stay. Will update designated Chemical engineer.

## 2019-04-04 NOTE — Progress Notes (Signed)
[facility] Progress Note   Dialysis Orders: [facility] CCPD 7 Days weekly 80 kg 4 exchanges 1.6 mls Dwell time 1.5 hr, No pause or day dwells. Uses mostly 2.5% solution  BMD meds: -Renvela 800 mg 3 tabs PO TID with meals -Calcitriol 0.5 mcg PO TIW (MWF) -   Assessment/Plan: 1. Gangrene/ bilateral Osteo feet with sever PVD-s/p bilateral BKA  3/17. In CIR. . 2. DM/Hyperglycemia: Per primary -hypoglycemia - eating all meals. Per primary. 3. ESRD - Azotemic, I suspect from protein intake and marginal PD Rx.  Increase vol to 2.3L, keep 4 exchanges. MIght need to be more cont wet as well.  Cont 1.5/2.5% solution 4. Hypertension/volume -BP well controlled.Notantihypertensive meds as OP. Amlodipine startedhere on3/13 Significantly under edw, down to 67.9kgon 3/19.Does not appear volume overload. Will need new edw at d/c. 5. Anemia - HGB initially 7.7 fell to 5.4 on3/12 and transfused two units PRBCs, up to 9.9 but now down to 7.4 on 3/26 .Heme neg 3/11 Ferritin still increased post surgery to 1420 with drop in tsat to 15% - will give a short course of IV Fe (250 x 2) on Aranesp 200/ week - last given qThurs. Transfuse as indicated. CTM.  6. Metabolic bone disease -CCa in goal.LastP ^ . Addedbinders w/snacks.- increased meal binders to 4 ac ContinueVDRA.  7. Nutrition - Albumin low add prostat, renal vits.-Renal/carb modified diet.   [facility] 04/04/2019,7:30 AM  LOS: 4 days   Subjective:    NO c/o, still on PD, tol Tx well Discussed high BUN/SCr/K with him Has little UOP  Objective Vitals:   04/03/19 1446 04/03/19 1927 04/03/19 1938 04/04/19 0546  BP: 136/64 (!) 117/49 119/71 133/61  Pulse: 69 71 71 67  Resp: 19  18 17   Temp: 97.7 F (36.5 C) 97.7 F (36.5 C) (!) 97.3 F (36.3 C) 97.8 F (36.6 C)  TempSrc:  Oral Axillary Oral  SpO2: 100% 100% 99% 100%  Weight:   71.9 kg   Height:       Physical  Exam General:slender male NAD on room air sitting in WC Heart: RRR Lungs: no rales Abdomen: + BS soft  Extremities:bilateral BKA with VAC/stump protectors in place - Dialysis Access:  PD cath   Additional Objective Labs: Basic Metabolic Panel: Recent Labs  Lab 03/31/19 0836 04/03/19 0650  NA 134* 131*  K 4.3 5.0  CL 89* 88*  CO2 25 24  GLUCOSE 44* 118*  BUN 99* 112*  CREATININE 13.52* 13.27*  CALCIUM 8.7* 8.5*  PHOS 7.3* 7.1*   Liver Function Tests: Recent Labs  Lab 03/31/19 0836 04/03/19 0650  ALBUMIN 1.8* 1.8*   No results for input(s): LIPASE, AMYLASE in the last 168 hours. CBC: Recent Labs  Lab 03/31/19 0836 04/03/19 0650  WBC 12.8* 9.4  NEUTROABS  --  6.1  HGB 8.0* 7.4*  HCT 26.1* 23.5*  MCV 94.9 95.9  PLT 255 216   Blood Culture    Component Value Date/Time   SDES BLOOD SITE NOT SPECIFIED 03/19/2019 2016   SPECREQUEST  03/19/2019 2016    BOTTLES DRAWN AEROBIC AND ANAEROBIC Blood Culture adequate volume   CULT  03/19/2019 2016    NO GROWTH 5 DAYS Performed at [facility], [place] [address] [place], [place] [address]    REPTSTATUS 03/24/2019 FINAL 03/19/2019 2016    Cardiac Enzymes: No results for input(s): CKTOTAL, CKMB, CKMBINDEX, TROPONINI in the last 168 hours. CBG: Recent Labs  Lab 04/03/19 0558 04/03/19 1153 04/03/19 1636 04/03/19 2118 04/04/19 0606  GLUCAP 122* 107* 142* 249* 134*   Iron Studies:  No results for input(s): IRON, TIBC, TRANSFERRIN, FERRITIN in the last 72 hours. No results found for: INR, PROTIME Studies/Results: No results found. Medications: . dialysis solution 1.5% low-MG/low-CA    . dialysis solution 2.5% low-MG/low-CA     . amLODipine  10 mg Oral Daily  . aspirin EC  81 mg Oral Daily  . atorvastatin  20 mg Oral q1800  . calcitRIOL  0.5 mcg Oral Once per day on Mon Wed Fri  . cloNIDine  0.1 mg Oral QHS  . [START ON 04/09/2019] darbepoetin (ARANESP) injection - NON-DIALYSIS  200 mcg Subcutaneous Q  Thu-1800  . docusate sodium  100 mg Oral BID  . feeding supplement (PRO-STAT SUGAR FREE 64)  30 mL Oral TID BM  . gabapentin  100 mg Oral QHS  . gentamicin cream  1 application Topical Daily  . heparin  5,000 Units Subcutaneous Q8H  . insulin aspart  0-5 Units Subcutaneous QHS  . insulin glargine  20 Units Subcutaneous Daily  . multivitamin  1 tablet Oral QHS  . sevelamer carbonate  3,200 mg Oral TID WC

## 2019-04-04 NOTE — Progress Notes (Addendum)
[facility]ICINE [facility] REHABILITATION PROGRESS NOTE   Subjective/Complaints: Patient seen sitting up in bed this morning, eating breakfast.  He states he slept well overnight.  He is concerned about not receiving his Renvela on time with meals.  Discussed with nursing.  ROS: Denies CP, SOB, N/V/D  Objective:   No results found. Recent Labs    04/03/19 0650  WBC 9.4  HGB 7.4*  HCT 23.5*  PLT 216   Recent Labs    04/03/19 0650  NA 131*  K 5.0  CL 88*  CO2 24  GLUCOSE 118*  BUN 112*  CREATININE 13.27*  CALCIUM 8.5*    Intake/Output Summary (Last 24 hours) at 04/04/2019 1726 Last data filed at 04/04/2019 1557 Gross per 24 hour  Intake 6944 ml  Output 8407 ml  Net -1463 ml     Physical Exam: Vital Signs Blood pressure (!) 110/49, pulse 66, temperature 98 F (36.7 C), temperature source Oral, resp. rate 17, height 4\' 8"  (1.422 m), weight 71.9 kg, SpO2 98 %. Constitutional: No distress . Vital signs reviewed. HENT: Normocephalic.  Atraumatic. Eyes: EOMI. No discharge. Cardiovascular: No JVD. Respiratory: Normal effort.  No stridor. GI: Non-distended. Skin: Bilateral BKA's with dressing C/C/I Psych: Normal mood.  Normal behavior. Musc: Bilateral BKA's with tenderness Motor: Bilateral upper extremities: Grossly 5/5 proximal distal Bilateral lower extremities: Hip flexion, knee extension 5/5   Assessment/Plan: 1. Functional deficits secondary to B/L BKAs due to gangrene/osteomyelitis which require 3+ hours per day of interdisciplinary therapy in a comprehensive inpatient rehab setting.  Physiatrist is providing close team supervision and 24 hour management of active medical problems listed below.  Physiatrist and rehab team continue to assess barriers to discharge/monitor patient progress toward functional and medical goals  Care Tool:  Bathing    Body parts bathed by patient: Right arm, Left arm, Chest, Abdomen, Face, Front perineal area, Right upper  leg, Left upper leg   Body parts bathed by helper: Buttocks Body parts n/a: Right lower leg, Left lower leg   Bathing assist Assist Level: Minimal Assistance - Patient > 75%     Upper Body Dressing/Undressing Upper body dressing   What is the patient wearing?: Pull over shirt    Upper body assist Assist Level: Contact Guard/Touching assist    Lower Body Dressing/Undressing Lower body dressing      What is the patient wearing?: Underwear/pull up, Pants     Lower body assist Assist for lower body dressing: Moderate Assistance - Patient 50 - 74%     Toileting Toileting    Toileting assist Assist for toileting: Minimal Assistance - Patient > 75%     Transfers Chair/bed transfer  Transfers assist     Chair/bed transfer assist level: Supervision/Verbal cueing     Locomotion Ambulation   Ambulation assist   Ambulation activity did not occur: Safety/medical concerns          Walk 10 feet activity   Assist  Walk 10 feet activity did not occur: Safety/medical concerns        Walk 50 feet activity   Assist Walk 50 feet with 2 turns activity did not occur: Safety/medical concerns         Walk 150 feet activity   Assist Walk 150 feet activity did not occur: Safety/medical concerns         Walk 10 feet on uneven surface  activity   Assist Walk 10 feet on uneven surfaces activity did not occur: Safety/medical concerns  Wheelchair     Assist Will patient use wheelchair at discharge?: Yes Type of Wheelchair: Manual    Wheelchair assist level: Minimal Assistance - Patient > 75% Max wheelchair distance: 150'    Wheelchair 50 feet with 2 turns activity    Assist        Assist Level: Supervision/Verbal cueing   Wheelchair 150 feet activity     Assist      Assist Level: Supervision/Verbal cueing   Blood pressure (!) 110/49, pulse 66, temperature 98 F (36.7 C), temperature source Oral, resp. rate 17, height  4\' 8"  (1.422 m), weight 71.9 kg, SpO2 98 %.  Medical Problem List and Plan: 1.Impaired function/ADLs/mobilitysecondary to B/L BKAs due to osteomyelitis and gangrene- s/p IV ABX- completed 3/18  Continue CIR  2. Antithrombotics: -DVT/anticoagulation:Pharmaceutical:Heparindue to PD/ESRD -antiplatelet therapy:  3. Pain Management:Hydrocodone as needed- per pt, pain meds are suitable/working 4. Mood: LCSW to follow for evaluation and support -antipsychotic agents:  5. Neuropsych: This patientiscapable of making decisions onhis own behalf. 6. Skin/Wound Care:  7. Fluids/Electrolytes/Nutrition:Strict I's and O's. Monitor weights daily after PD. 8.  ESRD: Continue CCPD daily.Is oliguric, not anuric- voids ~2x/day 9.Acute on chronic anemia: On ESA weekly.   Hemoglobin 7.4 on 3/26, continue to monitor 10.Nutrition: Severe protein malnutrition-Renal/carb modified diet to continue with prostat due to low calorie/proteinmalnutrition. 11. UPJ:SRPRXYV blood pressures twice daily. Off hydralazine and furosemide at this time.   Con't clonidine and Norvasc   Controlled on 3/27 12.T2DM with hyperglycemia: A1c 7.8-Continue Lantus daily--d/c meal coverage due to hypoglycemic episodes.Monitor blood sugars AC at bedtime for now due to hypoglycemia.   CBG (last 3)  Recent Labs    04/04/19 0606 04/04/19 1130 04/04/19 1627  GLUCAP 134* 92 112*   Labile on 3/27  13. Phantom itching- might benefit from nerve pain meds- low dose.  Consider medications if necessary 14. Leukocytosis: Resolved  LOS: 4 days A FACE TO FACE EVALUATION WAS PERFORMED  Jorge [name] [name] 04/04/2019, 5:26 PM

## 2019-04-05 ENCOUNTER — Inpatient Hospital Stay (HOSPITAL_COMMUNITY): Payer: Medicare (Managed Care)

## 2019-04-05 ENCOUNTER — Inpatient Hospital Stay (HOSPITAL_COMMUNITY): Payer: Medicare (Managed Care) | Admitting: Occupational Therapy

## 2019-04-05 DIAGNOSIS — E119 Type 2 diabetes mellitus without complications: Secondary | ICD-10-CM

## 2019-04-05 DIAGNOSIS — E162 Hypoglycemia, unspecified: Secondary | ICD-10-CM | POA: Insufficient documentation

## 2019-04-05 DIAGNOSIS — N186 End stage renal disease: Secondary | ICD-10-CM

## 2019-04-05 DIAGNOSIS — I10 Essential (primary) hypertension: Secondary | ICD-10-CM

## 2019-04-05 DIAGNOSIS — W19XXXA Unspecified fall, initial encounter: Secondary | ICD-10-CM | POA: Insufficient documentation

## 2019-04-05 DIAGNOSIS — D62 Acute posthemorrhagic anemia: Secondary | ICD-10-CM

## 2019-04-05 DIAGNOSIS — S88119A Complete traumatic amputation at level between knee and ankle, unspecified lower leg, initial encounter: Secondary | ICD-10-CM

## 2019-04-05 DIAGNOSIS — D638 Anemia in other chronic diseases classified elsewhere: Secondary | ICD-10-CM

## 2019-04-05 DIAGNOSIS — Z992 Dependence on renal dialysis: Secondary | ICD-10-CM

## 2019-04-05 DIAGNOSIS — Z794 Long term (current) use of insulin: Secondary | ICD-10-CM

## 2019-04-05 DIAGNOSIS — R7309 Other abnormal glucose: Secondary | ICD-10-CM

## 2019-04-05 LAB — GLUCOSE, CAPILLARY
Glucose-Capillary: 165 mg/dL — ABNORMAL HIGH (ref 70–99)
Glucose-Capillary: 53 mg/dL — ABNORMAL LOW (ref 70–99)
Glucose-Capillary: 140 mg/dL — ABNORMAL HIGH (ref 70–99)
Glucose-Capillary: 109 mg/dL — ABNORMAL HIGH (ref 70–99)
Glucose-Capillary: 213 mg/dL — ABNORMAL HIGH (ref 70–99)

## 2019-04-05 MED ORDER — INSULIN GLARGINE 100 UNIT/ML ~~LOC~~ SOLN
15.0000 [IU] | Freq: Every day | SUBCUTANEOUS | Status: DC
  Administered 2019-04-06 – 2019-04-07 (×2): 15 [IU] via SUBCUTANEOUS
  Filled 2019-04-05 (×2): qty 0.15

## 2019-04-05 NOTE — Significant Event (Signed)
Hypoglycemic Event  CBG: 53  Treatment: 8 oz juice/soda  Symptoms: None  Follow-up CBG: Time: 1250 CBG Result: 140  Possible Reasons for Event: Inadequate meal intake  Comments/MD notified: Dr. Posey Pronto notified, no new orders at this time.    [place]

## 2019-04-05 NOTE — Progress Notes (Addendum)
Physical Therapy Session Note  Patient Details  Name: Jorge [name] MRN: [id number] Date of Birth: 11-08-1970  Today's Date: 04/05/2019 PT Individual Time: 9774-1423 PT Individual Time Calculation (min): 60 min   Short Term Goals: Week 1:  PT Short Term Goal 1 (Week 1): STG = LTG due to short ELOS.  Skilled Therapeutic Interventions/Progress Updates:     Session 1: Patient in bed upon PT arrival. Patient alert and agreeable to PT session, as long as he could eat lunch during. Patient reported 3-4/10 B residual limb pain L>R during session, RN made aware. PT provided repositioning, rest breaks, and distraction as pain interventions throughout session. Noted mild bloody drainage through dressing over shrinker on L residual limb, RN made aware. Discussed patient fall with staff and problem solved how to create safer transfers with board positioning and +2 assist with nursing staff. Patient agreed to work on transfers with PT this session. NT reported that patient had low CBG when tested in the w/c during session. Patient encouraged to eat all of his lunch and provided a Sprite by RN. While he ate lunch, PT provided education on prosthesis fitting, ambulation, and provided pictures and videos of successful fitting and mobility. Discussed pressure injuries and need for constant maintenance and life-long fitting for prosthesis. Also discussed diet restrictions and management/maintenence of BG throughout the day. Patient was receptive and appreciative of all education.   Therapeutic Activity: Bed Mobility: Patient performed supine to/from sit with supervision with use of hospital bed features. Provided cues to maintain NWB on B residual limbs.  Transfers: Patient performed a slide board transfer bed<>w/c with min A-close supervision and min A for board placement. Provided cues for hand placement, board placement, and head-hips relationship for proper technique and decreased assist with transfers.    Patient in bed at end of session with breaks locked, bed alarm set, and all needs within reach. NT made aware of the time the patient finished eating in order to check CBG 15 min after at end of session.  Therapy Documentation Precautions:  Precautions Precautions: Fall Precaution Comments: wound VACs bilateral residual limbs, limb protectors Restrictions Weight Bearing Restrictions: Yes RLE Weight Bearing: Non weight bearing LLE Weight Bearing: Non weight bearing    Therapy/Group: Individual Therapy  Derisha Funderburke L Lurdes Haltiwanger PT, DPT  04/05/2019, 3:53 PM

## 2019-04-05 NOTE — Progress Notes (Signed)
Occupational Therapy Session Note  Patient Details  Name: Jorge [name] MRN: [id number] Date of Birth: July 14, 1970  Today's Date: 04/05/2019 OT Individual Time: 1500-1510 OT Individual Time Calculation (min): 10 min  50 missed minutes  Short Term Goals: Week 1:  OT Short Term Goal 1 (Week 1): Pt will transfer wiht CGA to BSC/toilet OT Short Term Goal 2 (Week 1): Pt will don pants wiht CGA using lateral leans OT Short Term Goal 3 (Week 1): Pt will manage pants prior to toileting wiht CGA  Skilled Therapeutic Interventions/Progress Updates:    Upon entering the room, pt supine in bed and reports, " I'm in a lot of pain from where I fell today". Pt then began pulling down pants and rolling self over onto bed pan in room. Pt reports, " Sorry, but I've need to go." OT discussed option of going to drop arm commode chair but pt declined secondary to urgency this session. Pt declined further OT intervention at this time. Call bell within reach and RN notified that pt is on bed pan.   Therapy Documentation Precautions:  Precautions Precautions: Fall Precaution Comments: wound VACs bilateral residual limbs, limb protectors Restrictions Weight Bearing Restrictions: Yes RLE Weight Bearing: Non weight bearing LLE Weight Bearing: Non weight bearing General:   Vital Signs: Therapy Vitals Temp: 97.6 F (36.4 C) Temp Source: Oral Pulse Rate: 69 Resp: 18 BP: (!) 132/57 Patient Position (if appropriate): Lying Oxygen Therapy SpO2: 100 % O2 Device: Room Air Pain:   ADL: ADL Grooming: Setup Where Assessed-Grooming: Sitting at sink Upper Body Bathing: Contact guard Where Assessed-Upper Body Bathing: Edge of bed Lower Body Bathing: Minimal assistance Where Assessed-Lower Body Bathing: Bed level Upper Body Dressing: Contact guard Where Assessed-Upper Body Dressing: Edge of bed Lower Body Dressing: Moderate assistance Where Assessed-Lower Body Dressing: Edge of bed   Therapy/Group:  Individual Therapy  Gypsy Decant 04/05/2019, 3:14 PM

## 2019-04-05 NOTE — Progress Notes (Signed)
[facility] Progress Note   Dialysis Orders: [facility] CCPD 7 Days weekly 80 kg 4 exchanges 1.6 mls Dwell time 1.5 hr, No pause or day dwells. Uses mostly 2.5% solution  BMD meds: -Renvela 800 mg 3 tabs PO TID with meals -Calcitriol 0.5 mcg PO TIW (MWF) -   Assessment/Plan: 1. Gangrene/ bilateral Osteo feet with sever PVD-s/p bilateral BKA  3/17. In CIR. . 2. DM/Hyperglycemia: Per primary -hypoglycemia - eating all meals. Per primary. 3. ESRD - Azotemic, I suspect from protein intake and marginal PD Rx.  Increased vol to 2.3L on 3/27, kept 4 exchanges. MIght need to be more cont wet as well (more wet during day).  Cont 1.5/2.5% solution 4. Hypertension/volume -BP well controlled.Notantihypertensive meds as OP. Amlodipine startedhere on3/13 Significantly under edw, down to 67.9kgon 3/19.Does not appear volume overload. Will need new edw at d/c. 5. Anemia - HGB initially 7.7 fell to 5.4 on3/12 and transfused two units PRBCs, up to 9.9 but now down to 7.4 on 3/26 .Heme neg 3/11 Ferritin still increased post surgery to 1420 with drop in tsat to 15% - s/p  a short course of IV Fe (250 x 2) on Aranesp 200/ week - last given qThurs. Transfuse as indicated. CTM.  6. Metabolic bone disease -CCa in goal.LastP ^ . Addedbinders w/snacks.- increased meal binders to 4 ac ContinueVDRA.  7. Nutrition - Albumin low prostat, renal vits.-Renal/carb modified diet.   [facility] 04/05/2019,11:07 AM  LOS: 5 days   Subjective:    No issues, using slideboard to wheelchair Tol inc fill volume yesterday, able to sleep  Objective Vitals:   04/04/19 1531 04/04/19 2057 04/05/19 0410 04/05/19 0740  BP: (!) 110/49 (!) 135/49 (!) 123/58 122/60  Pulse: 66 70 72 69  Resp: 17   16  Temp: 98 F (36.7 C) 97.7 F (36.5 C) 98.1 F (36.7 C) 98 F (36.7 C)  TempSrc: Oral Oral Oral Oral  SpO2: 98% 100% 99% 98%  Weight:    71.8 kg   Height:       Physical Exam General:slender male NAD on room air sitting in WC Heart: RRR Lungs: no rales Abdomen: + BS soft  Extremities:bilateral BKA with VAC/stump protectors in place - Dialysis Access:  PD cath   Additional Objective Labs: Basic Metabolic Panel: Recent Labs  Lab 03/31/19 0836 04/03/19 0650  NA 134* 131*  K 4.3 5.0  CL 89* 88*  CO2 25 24  GLUCOSE 44* 118*  BUN 99* 112*  CREATININE 13.52* 13.27*  CALCIUM 8.7* 8.5*  PHOS 7.3* 7.1*   Liver Function Tests: Recent Labs  Lab 03/31/19 0836 04/03/19 0650  ALBUMIN 1.8* 1.8*   No results for input(s): LIPASE, AMYLASE in the last 168 hours. CBC: Recent Labs  Lab 03/31/19 0836 04/03/19 0650  WBC 12.8* 9.4  NEUTROABS  --  6.1  HGB 8.0* 7.4*  HCT 26.1* 23.5*  MCV 94.9 95.9  PLT 255 216   Blood Culture    Component Value Date/Time   SDES BLOOD SITE NOT SPECIFIED 03/19/2019 2016   SPECREQUEST  03/19/2019 2016    BOTTLES DRAWN AEROBIC AND ANAEROBIC Blood Culture adequate volume   CULT  03/19/2019 2016    NO GROWTH 5 DAYS Performed at [facility], [place] [address], [place], [place] [address]    REPTSTATUS 03/24/2019 FINAL 03/19/2019 2016    Cardiac Enzymes: No results for input(s): CKTOTAL, CKMB, CKMBINDEX, TROPONINI in the last 168  hours. CBG: Recent Labs  Lab 04/04/19 0606 04/04/19 1130 04/04/19 1627 04/04/19 2059 04/05/19 0611  GLUCAP 134* 92 112* 146* 165*   Iron Studies:  No results for input(s): IRON, TIBC, TRANSFERRIN, FERRITIN in the last 72 hours. No results found for: INR, PROTIME Studies/Results: No results found. Medications: . dialysis solution 1.5% low-MG/low-CA    . dialysis solution 2.5% low-MG/low-CA     . amLODipine  10 mg Oral Daily  . aspirin EC  81 mg Oral Daily  . atorvastatin  20 mg Oral q1800  . calcitRIOL  0.5 mcg Oral Once per day on Mon Wed Fri  . cloNIDine  0.1 mg Oral QHS  . [START ON 04/09/2019] darbepoetin (ARANESP) injection - NON-DIALYSIS   200 mcg Subcutaneous Q Thu-1800  . docusate sodium  100 mg Oral BID  . feeding supplement (PRO-STAT SUGAR FREE 64)  30 mL Oral TID BM  . gabapentin  100 mg Oral QHS  . gentamicin cream  1 application Topical Daily  . heparin  5,000 Units Subcutaneous Q8H  . insulin aspart  0-5 Units Subcutaneous QHS  . insulin glargine  20 Units Subcutaneous Daily  . multivitamin  1 tablet Oral QHS  . sevelamer carbonate  3,200 mg Oral TID WC

## 2019-04-05 NOTE — Progress Notes (Addendum)
[facility] PHYSICAL MEDICINE & REHABILITATION PROGRESS NOTE   Subjective/Complaints: Patient seen sitting up in bed this morning eating breakfast.  He states he slept well overnight.  Later called by nursing regarding assisted fall on stumps with therapies some bleeding noted at incision site.  Unclear if patient had done stump protectors.  He was seen by nephrology today, notes reviewed-continue current care.  ROS: Denies CP, SOB, N/V/D  Objective:   No results found. Recent Labs    04/03/19 0650  WBC 9.4  HGB 7.4*  HCT 23.5*  PLT 216   Recent Labs    04/03/19 0650  NA 131*  K 5.0  CL 88*  CO2 24  GLUCOSE 118*  BUN 112*  CREATININE 13.27*  CALCIUM 8.5*    Intake/Output Summary (Last 24 hours) at 04/05/2019 1244 Last data filed at 04/05/2019 0748 Gross per 24 hour  Intake 240 ml  Output 625 ml  Net -385 ml     Physical Exam: Vital Signs Blood pressure 122/60, pulse 69, temperature 98 F (36.7 C), temperature source Oral, resp. rate 16, height 4\' 8"  (1.422 m), weight 71.8 kg, SpO2 98 %.  Constitutional: No distress . Vital signs reviewed. HENT: Normocephalic.  Atraumatic. Eyes: EOMI. No discharge. Cardiovascular: No JVD. Respiratory: Normal effort.  No stridor. GI: Non-distended. Skin: Bilateral BKA's with stump protectors in place Psych: Normal mood.  Normal behavior. Musc: Bilateral BKA's. Neuro: Alert Motor:  Bilateral lower extremities: Hip flexion, knee extension 5/5, unchanged  Assessment/Plan: 1. Functional deficits secondary to B/L BKAs due to gangrene/osteomyelitis which require 3+ hours per day of interdisciplinary therapy in a comprehensive inpatient rehab setting.  Physiatrist is providing close team supervision and 24 hour management of active medical problems listed below.  Physiatrist and rehab team continue to assess barriers to discharge/monitor patient progress toward functional and medical goals  Care Tool:  Bathing    Body parts  bathed by patient: Right arm, Left arm, Chest, Abdomen, Face, Front perineal area, Right upper leg, Left upper leg   Body parts bathed by helper: Buttocks Body parts n/a: Right lower leg, Left lower leg   Bathing assist Assist Level: Minimal Assistance - Patient > 75%     Upper Body Dressing/Undressing Upper body dressing   What is the patient wearing?: Pull over shirt    Upper body assist Assist Level: Contact Guard/Touching assist    Lower Body Dressing/Undressing Lower body dressing      What is the patient wearing?: Underwear/pull up, Pants     Lower body assist Assist for lower body dressing: Moderate Assistance - Patient 50 - 74%     Toileting Toileting    Toileting assist Assist for toileting: Minimal Assistance - Patient > 75%     Transfers Chair/bed transfer  Transfers assist     Chair/bed transfer assist level: Supervision/Verbal cueing     Locomotion Ambulation   Ambulation assist   Ambulation activity did not occur: Safety/medical concerns          Walk 10 feet activity   Assist  Walk 10 feet activity did not occur: Safety/medical concerns        Walk 50 feet activity   Assist Walk 50 feet with 2 turns activity did not occur: Safety/medical concerns         Walk 150 feet activity   Assist Walk 150 feet activity did not occur: Safety/medical concerns         Walk 10 feet on uneven surface  activity  Assist Walk 10 feet on uneven surfaces activity did not occur: Safety/medical concerns         Wheelchair     Assist Will patient use wheelchair at discharge?: Yes Type of Wheelchair: Manual    Wheelchair assist level: Minimal Assistance - Patient > 75% Max wheelchair distance: 150'    Wheelchair 50 feet with 2 turns activity    Assist        Assist Level: Supervision/Verbal cueing   Wheelchair 150 feet activity     Assist      Assist Level: Supervision/Verbal cueing   Blood pressure  122/60, pulse 69, temperature 98 F (36.7 C), temperature source Oral, resp. rate 16, height 4\' 8"  (1.422 m), weight 71.8 kg, SpO2 98 %.  Medical Problem List and Plan: 1.Impaired function/ADLs/mobilitysecondary to B/L BKAs due to osteomyelitis and gangrene- s/p IV ABX- completed 3/18  Continue CIR  2. Antithrombotics: -DVT/anticoagulation:Pharmaceutical:Heparindue to PD/ESRD -antiplatelet therapy:  3. Pain Management:Hydrocodone as needed- per pt, pain meds are suitable/working 4. Mood: LCSW to follow for evaluation and support -antipsychotic agents:  5. Neuropsych: This patientiscapable of making decisions onhis own behalf. 6. Skin/Wound Care:   Will need to monitor incisions closely given recent fall 7. Fluids/Electrolytes/Nutrition:Strict I's and O's. Monitor weights daily after PD. 8.  ESRD: Continue CCPD daily.Is oliguric, not anuric- voids ~2x/day 9.Acute on chronic anemia: On ESA weekly.   Hemoglobin 7.4 on 3/26, continue to monitor 10.Nutrition: Severe protein malnutrition-Renal/carb modified diet to continue with prostat due to low calorie/proteinmalnutrition. 11. LKT:GYBWLSL blood pressures twice daily. Off hydralazine and furosemide at this time.   Con't clonidine and Norvasc   Controlled on 3/28 12.T2DM with hyperglycemia: A1c 7.8-Continue Lantus daily--d/c meal coverage due to hypoglycemic episodes.Monitor blood sugars AC at bedtime for now due to hypoglycemia.   CBG (last 3)  Recent Labs    04/04/19 2059 04/05/19 0611 04/05/19 1224  GLUCAP 146* 165* 53*   Lantus decreased to 15 units on 3/29 (already received a.m. dose)  Labile with hypoglycemia on 3/28.  Will need to monitor closely overnight.  Bedtime snack ordered for tonight. 13. Phantom itching- might benefit from nerve pain meds- low dose.  Consider medications if necessary 14. Leukocytosis: Resolved  LOS: 5 days A FACE TO FACE EVALUATION WAS  PERFORMED  Rondell Pardon [name] 04/05/2019, 12:44 PM

## 2019-04-05 NOTE — Progress Notes (Signed)
Patient had assisted fall with therapy around 1200 today doing a sliding board transfer from bed to wheelchair. Patient had stump shrinkers on when he hit the floor. Patient was with Geannie Risen, OT at time of fall. Nurse and charge nurse came to assess stumps; charge nurse instructed to put on stump shrinkers then wrap with kerlix. Both stumps are now wrapped and pain medication was given. Dr. Posey Pronto notified, no new orders at this time.  [place]

## 2019-04-05 NOTE — Progress Notes (Signed)
Occupational Therapy Session Note  Patient Details  Name: Jorge [name] MRN: [id number] Date of Birth: 04/04/1970  Today's Date: 04/05/2019 OT Individual Time: 1000-1100 OT Individual Time Calculation (min): 60 min   Short Term Goals: Week 1:  OT Short Term Goal 1 (Week 1): Pt will transfer wiht CGA to BSC/toilet OT Short Term Goal 2 (Week 1): Pt will don pants wiht CGA using lateral leans OT Short Term Goal 3 (Week 1): Pt will manage pants prior to toileting wiht CGA  Skilled Therapeutic Interventions/Progress Updates:    Pt greeted in bed, requesting to engage in bathing/dressing tasks. Pt transitioned EOB with supervision assist. Educated pt on leaning technique to wash and dress LB which he did with supervision. He had on shorts beforehand but was able to lower and elevate them unassisted. Pt also had shirt that he elevated during bathing and then lowered again. Pt doffed his bilateral shrinkers and examined incision sites using [place] mirror. He then donned shrinkers again and completed oral care with setup assist. Pt set up w/c and placed slideboard in prep for slideboard transfer with OT stabilizing w/c. After board was placed he took one scoot and then stated "I'm falling." Pts residual limbs made contact with floor and OT then assisted him with transition back to bed. Note that his residual limbs were bleeding, RN in to assess and bandage while OT calmed pt. Afterwards he wanted to sit in the w/c. Min A for slideboard<w/c. After he donned the Rt limb rest with supervision, OT assisted him with the Lt side. Pt then reported sitting up in the w/c was increasing residual limb pain, wanting to return to bed. Min A for slideboard<bed and pt returned to supine with supervision. Left him with all needs within reach and bed alarm set.    Therapy Documentation Precautions:  Precautions Precautions: Fall Precaution Comments: wound VACs bilateral residual limbs, limb protectors Restrictions Weight  Bearing Restrictions: Yes RLE Weight Bearing: Non weight bearing LLE Weight Bearing: Non weight bearing Vital Signs: Therapy Vitals Temp: 97.6 F (36.4 C) Temp Source: Oral Pulse Rate: 69 Resp: 18 BP: (!) 132/57 Patient Position (if appropriate): Lying Oxygen Therapy SpO2: 100 % O2 Device: Room Air Pain: in residual limbs after limbs made contact with floor. RN in during session and provided pain medicine    ADL: ADL Grooming: Setup Where Assessed-Grooming: Sitting at sink Upper Body Bathing: Contact guard Where Assessed-Upper Body Bathing: Edge of bed Lower Body Bathing: Minimal assistance Where Assessed-Lower Body Bathing: Bed level Upper Body Dressing: Contact guard Where Assessed-Upper Body Dressing: Edge of bed Lower Body Dressing: Moderate assistance Where Assessed-Lower Body Dressing: Edge of bed     Therapy/Group: Individual Therapy  Ellias Mcelreath A Janee Ureste 04/05/2019, 4:19 PM

## 2019-04-06 ENCOUNTER — Inpatient Hospital Stay (HOSPITAL_COMMUNITY): Payer: Medicare (Managed Care)

## 2019-04-06 ENCOUNTER — Inpatient Hospital Stay (HOSPITAL_COMMUNITY): Payer: Medicare (Managed Care) | Admitting: Occupational Therapy

## 2019-04-06 LAB — CBC
HCT: 17.9 % — ABNORMAL LOW (ref 39.0–52.0)
HCT: 22.6 % — ABNORMAL LOW (ref 39.0–52.0)
Hemoglobin: 5.9 g/dL — CL (ref 13.0–17.0)
Hemoglobin: 7 g/dL — ABNORMAL LOW (ref 13.0–17.0)
MCH: 30.3 pg (ref 26.0–34.0)
MCH: 30.2 pg (ref 26.0–34.0)
MCHC: 33 g/dL (ref 30.0–36.0)
MCHC: 31 g/dL (ref 30.0–36.0)
MCV: 91.8 fL (ref 80.0–100.0)
MCV: 97.4 fL (ref 80.0–100.0)
Platelets: 259 10*3/uL (ref 150–400)
Platelets: 223 10*3/uL (ref 150–400)
RBC: 1.95 MIL/uL — ABNORMAL LOW (ref 4.22–5.81)
RBC: 2.32 MIL/uL — ABNORMAL LOW (ref 4.22–5.81)
RDW: 14 % (ref 11.5–15.5)
RDW: 14.6 % (ref 11.5–15.5)
WBC: 11.2 10*3/uL — ABNORMAL HIGH (ref 4.0–10.5)
WBC: 13.1 10*3/uL — ABNORMAL HIGH (ref 4.0–10.5)
nRBC: 0 % (ref 0.0–0.2)
nRBC: 0 % (ref 0.0–0.2)

## 2019-04-06 LAB — RENAL FUNCTION PANEL
Albumin: 1.8 g/dL — ABNORMAL LOW (ref 3.5–5.0)
Anion gap: 20 — ABNORMAL HIGH (ref 5–15)
BUN: 116 mg/dL — ABNORMAL HIGH (ref 6–20)
CO2: 22 mmol/L (ref 22–32)
Calcium: 8.8 mg/dL — ABNORMAL LOW (ref 8.9–10.3)
Chloride: 87 mmol/L — ABNORMAL LOW (ref 98–111)
Creatinine, Ser: 13.35 mg/dL — ABNORMAL HIGH (ref 0.61–1.24)
GFR calc Af Amer: 4 mL/min — ABNORMAL LOW (ref >60)
GFR calc non Af Amer: 4 mL/min — ABNORMAL LOW (ref >60)
Glucose, Bld: 189 mg/dL — ABNORMAL HIGH (ref 70–99)
Phosphorus: 5.8 mg/dL — ABNORMAL HIGH (ref 2.5–4.6)
Potassium: 5.1 mmol/L (ref 3.5–5.1)
Sodium: 129 mmol/L — ABNORMAL LOW (ref 135–145)

## 2019-04-06 LAB — GLUCOSE, CAPILLARY
Glucose-Capillary: 217 mg/dL — ABNORMAL HIGH (ref 70–99)
Glucose-Capillary: 257 mg/dL — ABNORMAL HIGH (ref 70–99)
Glucose-Capillary: 225 mg/dL — ABNORMAL HIGH (ref 70–99)

## 2019-04-06 MED ORDER — INSULIN ASPART 100 UNIT/ML ~~LOC~~ SOLN
0.0000 [IU] | Freq: Every day | SUBCUTANEOUS | Status: DC
  Administered 2019-04-06: 2 [IU] via SUBCUTANEOUS
  Administered 2019-04-10: 4 [IU] via SUBCUTANEOUS
  Administered 2019-04-11: 3 [IU] via SUBCUTANEOUS
  Administered 2019-04-16: 2 [IU] via SUBCUTANEOUS

## 2019-04-06 MED ORDER — INSULIN ASPART 100 UNIT/ML ~~LOC~~ SOLN
0.0000 [IU] | Freq: Three times a day (TID) | SUBCUTANEOUS | Status: DC
  Administered 2019-04-06: 2 [IU] via SUBCUTANEOUS
  Administered 2019-04-07 – 2019-04-08 (×2): 1 [IU] via SUBCUTANEOUS
  Administered 2019-04-09: 3 [IU] via SUBCUTANEOUS
  Administered 2019-04-10 – 2019-04-12 (×4): 1 [IU] via SUBCUTANEOUS
  Administered 2019-04-12: 07:00:00 3 [IU] via SUBCUTANEOUS
  Administered 2019-04-14 – 2019-04-15 (×2): 1 [IU] via SUBCUTANEOUS
  Administered 2019-04-17: 0 [IU] via SUBCUTANEOUS
  Administered 2019-04-20 – 2019-04-21 (×2): 1 [IU] via SUBCUTANEOUS
  Administered 2019-04-22: 2 [IU] via SUBCUTANEOUS
  Administered 2019-04-24: 1 [IU] via SUBCUTANEOUS
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
## 2019-04-06 NOTE — Progress Notes (Signed)
Inpatient Diabetes Program Recommendations  AACE/ADA: New Consensus Statement on Inpatient Glycemic Control   Target Ranges:  Prepandial:   less than 140 mg/dL      Peak postprandial:   less than 180 mg/dL (1-2 hours)      Critically ill patients:  140 - 180 mg/dL  Results for [name], [name] (MRN [id number]) as of 04/06/2019 11:33  Ref. Range 04/06/2019 09:24  Glucose Latest Ref Range: 70 - 99 mg/dL 189 (H)   Results for [name], [name] (MRN [id number]) as of 04/06/2019 11:33  Ref. Range 04/05/2019 06:11 04/05/2019 12:24 04/05/2019 13:04 04/05/2019 16:43 04/05/2019 20:55 04/06/2019 04:24  Glucose-Capillary Latest Ref Range: 70 - 99 mg/dL 165 (H)  Lantus 20 units 53 (L) 140 (H) 109 (H) 213 (H)  Novolog 2 units 217 (H)   Review of Glycemic Control  Diabetes history:DM2 Outpatient Diabetes medications: Lantus 20 units daily, Novolog 0-15 units TID with meals Current orders for Inpatient glycemic control: Lantus 15 units daily, Novolog 0-5 units QHS  Inpatient Diabetes Program Recommendations:   Insulin - Basal: Noted Lantus was decreased from 20 to 15 units daily.  Correction (SSI): Please consider ordering Novolog 0-6 units TID with meals for correction.  NOTE: Noted consult for Diabetes Coordinator. Chart reviewed. Noted glucose of 53 mg/dl at 12:24 on 3/28 and that Lantus was decreased from 20 to 15 units daily (started today).  Recommend adding Novolog 0-6 units TID (very sensitive correction scale). Will continue to follow glucose trends and make further recommendations if needed.  Thanks, Jorge Alderman, RN, MSN, CDE Diabetes Coordinator Inpatient Diabetes Program [phone] (Team Pager from 8am to 5pm)

## 2019-04-06 NOTE — Progress Notes (Signed)
[facility] Progress Note   Dialysis Orders: [facility] CCPD 7 Days weekly 80 kg 4 exchanges 1.6 mls Dwell time 1.5 hr, No pause or day dwells. Uses mostly 2.5% solution  BMD meds: -Renvela 800 mg 3 tabs PO TID with meals -Calcitriol 0.5 mcg PO TIW (MWF) -   Assessment/Plan: 1. 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 In CIR.  2. DM/Hyperglycemia: Per primary  3. ESRD - Azotemic, I suspect from protein intake and marginal PD Rx.  Increased vol to 2.3L on 3/27, kept 4 exchanges. MIght need to be more cont wet as well (more wet during day).  Cont 1.5/2.5% solution. Ordered renal panel for today to trend.  4. Hypertension/volume -BP well controlled.Notantihypertensive meds as OP. Amlodipine startedhere on3/13 Significantly under edw, down to 67.9kgon 3/19.Does not appear volume overload. Will need new edw at d/c. 5. Anemia - HGB initially 7.7 fell to 5.4 on3/12 and transfused two units PRBCs.  Update CBC.Heme neg 3/11 Ferritin still increased post surgery to 1420 with drop in tsat to 15% - s/p  a short course of IV Fe (250 x 2) on Aranesp 200/ week - last given qThurs. Transfuse as indicated. CTM.  6. Metabolic bone disease -CCa in goal.  hyperphos - addedbinders w/snacks.- increased meal binders to 4 ac ContinueVDRA.  7. Nutrition - Albumin low prostat, renal vits.-Renal/carb modified diet.     Subjective:    UF from PD is not yet charted.  States PD has gone ok with the fill volume increase.  He's not sure when will be discharged  Review of systems: Denies shortness of breath  Denies chest pain  Denies n/v   Objective Vitals:   04/05/19 1432 04/05/19 1622 04/05/19 2043 04/06/19 0427  BP: (!) 132/57 134/62 124/64 (!) 130/57  Pulse: 69 69 74 72  Resp: 18 18 18 19   Temp: 97.6 F (36.4 C) 97.6 F (36.4 C) (!) 97.4 F (36.3 C) 97.6 F (36.4 C)  TempSrc: Oral Oral Oral Oral  SpO2: 100% 98% 98% 98%   Weight:  70.2 kg  76.8 kg  Height:       Physical Exam General:slender male NAD on room air sitting in WC HEENT NCAT  Heart: S1S2; no rub Lungs: clear and unlabored  Abdomen: soft nontender nondistended  Extremities:bilateral BKA ; no pitting edema residual limbs Dialysis Access:  PD cath dressing dry and intact   Additional Objective Labs: Basic Metabolic Panel: Recent Labs  Lab 03/31/19 0836 04/03/19 0650  NA 134* 131*  K 4.3 5.0  CL 89* 88*  CO2 25 24  GLUCOSE 44* 118*  BUN 99* 112*  CREATININE 13.52* 13.27*  CALCIUM 8.7* 8.5*  PHOS 7.3* 7.1*   Liver Function Tests: Recent Labs  Lab 03/31/19 0836 04/03/19 0650  ALBUMIN 1.8* 1.8*   No results for input(s): LIPASE, AMYLASE in the last 168 hours. CBC: Recent Labs  Lab 03/31/19 0836 04/03/19 0650  WBC 12.8* 9.4  NEUTROABS  --  6.1  HGB 8.0* 7.4*  HCT 26.1* 23.5*  MCV 94.9 95.9  PLT 255 216   Blood Culture    Component Value Date/Time   SDES BLOOD SITE NOT SPECIFIED 03/19/2019 2016   SPECREQUEST  03/19/2019 2016    BOTTLES DRAWN AEROBIC AND ANAEROBIC Blood Culture adequate volume   CULT  03/19/2019 2016    NO GROWTH 5 DAYS Performed at [facility], [place] [address] [place], [place] [address]  REPTSTATUS 03/24/2019 FINAL 03/19/2019 2016    Cardiac Enzymes: No results for input(s): CKTOTAL, CKMB, CKMBINDEX, TROPONINI in the last 168 hours. CBG: Recent Labs  Lab 04/05/19 1224 04/05/19 1304 04/05/19 1643 04/05/19 2055 04/06/19 0424  GLUCAP 53* 140* 109* 213* 217*   Iron Studies:  No results for input(s): IRON, TIBC, TRANSFERRIN, FERRITIN in the last 72 hours. No results found for: INR, PROTIME Studies/Results: No results found. Medications: . dialysis solution 1.5% low-MG/low-CA    . dialysis solution 2.5% low-MG/low-CA     . amLODipine  10 mg Oral Daily  . aspirin EC  81 mg Oral Daily  . atorvastatin  20 mg Oral q1800  . calcitRIOL  0.5 mcg Oral Once per day on Mon Wed Fri   . cloNIDine  0.1 mg Oral QHS  . [START ON 04/09/2019] darbepoetin (ARANESP) injection - NON-DIALYSIS  200 mcg Subcutaneous Q Thu-1800  . docusate sodium  100 mg Oral BID  . feeding supplement (PRO-STAT SUGAR FREE 64)  30 mL Oral TID BM  . gabapentin  100 mg Oral QHS  . gentamicin cream  1 application Topical Daily  . heparin  5,000 Units Subcutaneous Q8H  . insulin aspart  0-5 Units Subcutaneous QHS  . insulin glargine  15 Units Subcutaneous Daily  . multivitamin  1 tablet Oral QHS  . sevelamer carbonate  3,200 mg Oral TID WC       Claudia Desanctis 04/06/2019 9:32 AM

## 2019-04-06 NOTE — Progress Notes (Signed)
The chaplain responded to a referral from Social Work for an Scientist, physiological (AD). The chaplain provided the paperwork and visited with the patient to offer any assistance. The patient is going to look over the paperwork and talk with his family. The chaplain will follow-up when the patient is ready for a notary.  Brion Aliment Chaplain Resident For questions concerning this note please contact me by pager [phone]

## 2019-04-06 NOTE — Progress Notes (Signed)
SSI very sensitive SSI added --BS elevated today.  CBC with drop in H/H. Patient denies abdominal pain and no documentation of hematuria or blood in stools. Was on ASA PTA. Bilateral BKA examined--left with ecchymosis on flap. Both incisions dry and right with more edema than left. Will order repeat  CBC for accuracy--BP stable and patient asymptomatic.  Will order stool guaiacs--monitor for now.

## 2019-04-06 NOTE — Progress Notes (Signed)
This nurse was informed by lab of critical lab hemoglobin at 5.9. This Probation officer then called  Algis Liming, [place] and notified her of findings. Adria Devon, LPN

## 2019-04-06 NOTE — Progress Notes (Addendum)
Physical Therapy Session Note  Patient Details  Name: Jorge [name] MRN: [id number] Date of Birth: [birth year]  Today's Date: 04/06/2019 PT Individual Time: 0910-1030 and 8850-2774 PT Individual Time Calculation (min): 80 min and 50 min  Short Term Goals: Week 1:  PT Short Term Goal 1 (Week 1): STG = LTG due to short ELOS.  Skilled Therapeutic Interventions/Progress Updates:     Session 1: Patient in bed on the bed pan upon PT arrival. Patient alert and agreeable to PT session. Patient reported 3/10 B residual limb pain during session, RN made aware. PT provided repositioning, rest breaks, and distraction as pain interventions throughout session. RN arrived to assess patient's incisions. B incisions did not have any drainage this morning and PT donned shrinkers with total A over B residual limbs as a lab tech took a blood draw. Patient correctly stated purpose of shrinkers and donning technique using teach-back method from educuation during a previous session. The Nephrologist also came in during this session and patient missed 5 min of skilled PT due to MD consult.    Therapeutic Activity: Bed Mobility: Patient performed supine to/from sit and supine to/from long sit with supervision in a flat bed with use of bed rails. He required 3 attempts to come to long sit, however, was able to perform the activity without physical assistance Provided verbal cues for pushing elbows into the bed then pushing up to his hands to come to sitting. He performed rolling  Transfers: Patient performed a slide board transfer bed<>drop arm BSC with min A for balance, scooting, and stabilizing the board and max A for board placement. Provided cues for hand placement, board placement, and head-hips relationship for proper technique and decreased assist with transfers.   Patient was extremely constipated and was unsuccessful to have a BM on the bed pan. He sat on the [facility] and was unsuccessful for ~20 min. Performed  lateral leans, seated cruches, rocking, lower abdominal massage, humming, and relaxation techniques for stimulation of bowl movement with some success, however, patient reported that he continued to feel the urge to go. Patient stated that he had performed digital stimulation before with success. Provided patient with gloved and patient performed self-digital stimulation with lateral leans on the BSC with min-mod A for balance and trunk support and was able to have a more successful BM. He required total A for peri-care and max A for LB clothing management with min A-CGA performing lateral leans on the BSC and supervision-mod I for clothing management in the bed during toileting.  Patient in bed at end of session with breaks locked, bed alarm set, and all needs within reach.   Session 2: Patient in bed on the bed pan upon PT arrival. Patient alert and agreeable to PT session. Patient reported 2-3/10 B residual limb pain during session, RN made aware. PT provided repositioning, rest breaks, and distraction as pain interventions throughout session. Noted critical lab results, hemoglobin 5.9, in patient's chart at beginning of session. Patient remained in the bed for safety throughout session.   Therapeutic Activity: Bed Mobility: Patient performed rolling L/R and supine to/from sit with supervision with and without use of bed rails. Provided verbal cues for practicing without use of bed rails to simulate home set-up. Performed bed mobility for removal of bed pan and peri-care with total A following a continent BM. Patient was able to perform LB clothing mangement with min A threading LEs through incontinence brief, otherwise with supervision. Also performed rolling for therapist to  change bed sheet. Provided cues for use of abdominals to roll with bringing knees to chest.  PT washed dirty shrinkers educating patient about hygiene and care of shrinkes. PT then assisted patient with donning new shrinkers of a  smaller size provided earlier today with max-mod A. Patient self-checked that the shrinkers were tight at the bottom without cues.   Discussed d/c planning, signs and symptoms of low hemoglobin, and hydration following frequent BMs today, while following fluid guidelines set by his Nephrologist, throughout session.  Patient in bed at end of session with breaks locked, bed alarm set, and all needs within reach.    Therapy Documentation Precautions:  Precautions Precautions: Fall Precaution Comments: wound VACs bilateral residual limbs, limb protectors Restrictions Weight Bearing Restrictions: Yes RLE Weight Bearing: Non weight bearing LLE Weight Bearing: Non weight bearing    Therapy/Group: Individual Therapy  Masai Kidd L Therma Lasure PT, DPT  04/06/2019, 12:59 PM

## 2019-04-06 NOTE — Progress Notes (Addendum)
Social Work Patient ID: Jorge [name], male   DOB: 06/25/1970, 49 y.o.   MRN: [id number]    SW followed up with pt sister Langley Gauss [phone]) per his request to discuss d/c plan. She reported concerns related to pt not having 24/7 care at d/c since patient's children are in college students and live on campues; and she works for an Insurance underwriter as a Catering manager (has a flexible schedule but still limited). She also reports that her home is not w/c accessible. States that her home is not w/c accessible and is concerned about being able to provide the care he needs. She also states as a family they have discussed possible SNF. Unsure if pt is amenable and encouraged family to discuss again.SW did discuss not being sure if pt will be covered due to his current insurance plan not being an [facility] Medicare plan. SW discussed how to transition pt to an [facility] Medicare plan. SW also to leave [facility] Medicaid application in pt room as pt [place] Medicaid will not be accepted. She intends to come in today to help pt complete Medicaid application and help pt change his insurance plan. SW discussed the benefits of caregiver education in efforts to see gains pt made in rehab, to make a more informed decision on what pt is capable of doing. SW to follow-up with updates from team conference.   *SW left message for chaplain services requesting Advanced Care Directive/Living will at pt request on 3/26.  Loralee Pacas, MSW, [place] Office: [phone] Cell: [phone] Fax: [phone]

## 2019-04-06 NOTE — Progress Notes (Signed)
[facility] PHYSICAL MEDICINE & REHABILITATION PROGRESS NOTE   Subjective/Complaints:  Pt reported he fell transferring yesterday- hit both BKAs, but mainly the L- was 12/10-  Pain now 5-6/10.   Is concerned about L BKA- and how it looks/feels. Pain is much better than yesterday.   ROS:  Pt denies SOB, abd pain, CP, N/V/C/D, and vision changes   Objective:   No results found. No results for input(s): WBC, HGB, HCT, PLT in the last 72 hours. Recent Labs    04/06/19 0924  NA 129*  K 5.1  CL 87*  CO2 22  GLUCOSE 189*  BUN 116*  CREATININE 13.35*  CALCIUM 8.8*    Intake/Output Summary (Last 24 hours) at 04/06/2019 1049 Last data filed at 04/05/2019 2053 Gross per 24 hour  Intake 297 ml  Output 100 ml  Net 197 ml     Physical Exam: Vital Signs Blood pressure (!) 130/57, pulse 72, temperature 97.6 F (36.4 C), temperature source Oral, resp. rate 19, height 4\' 8"  (1.422 m), weight 76.8 kg, SpO2 98 %.  Gen: awake, alert, appropriate, sitting up in bed; NAD CV: RRR  No JVD Pulm: CTA B/L- good air movement GI: soft, NT, ND, (+)BS Skin: Bilateral BKA's - L BKA puffy skin not completely attached (pulled by shrinker)  At midline- surrounded by dried blood- nothing draining actively- no erythema- nothing actually open.  Psych: appropriate Musc: Bilateral BKA's. Neuro: Ox3 Motor:  Bilateral lower extremities: Hip flexion, knee extension 5/5, unchanged  Assessment/Plan: 1. Functional deficits secondary to B/L BKAs due to gangrene/osteomyelitis which require 3+ hours per day of interdisciplinary therapy in a comprehensive inpatient rehab setting.  Physiatrist is providing close team supervision and 24 hour management of active medical problems listed below.  Physiatrist and rehab team continue to assess barriers to discharge/monitor patient progress toward functional and medical goals  Care Tool:  Bathing    Body parts bathed by patient: Right arm, Left arm, Chest,  Abdomen, Face, Front perineal area, Right upper leg, Left upper leg   Body parts bathed by helper: Buttocks Body parts n/a: Right lower leg, Left lower leg   Bathing assist Assist Level: Minimal Assistance - Patient > 75%     Upper Body Dressing/Undressing Upper body dressing   What is the patient wearing?: Pull over shirt    Upper body assist Assist Level: Contact Guard/Touching assist    Lower Body Dressing/Undressing Lower body dressing      What is the patient wearing?: Underwear/pull up, Pants     Lower body assist Assist for lower body dressing: Moderate Assistance - Patient 50 - 74%     Toileting Toileting    Toileting assist Assist for toileting: Minimal Assistance - Patient > 75%     Transfers Chair/bed transfer  Transfers assist     Chair/bed transfer assist level: Minimal Assistance - Patient > 75%     Locomotion Ambulation   Ambulation assist   Ambulation activity did not occur: Safety/medical concerns          Walk 10 feet activity   Assist  Walk 10 feet activity did not occur: Safety/medical concerns        Walk 50 feet activity   Assist Walk 50 feet with 2 turns activity did not occur: Safety/medical concerns         Walk 150 feet activity   Assist Walk 150 feet activity did not occur: Safety/medical concerns         Walk 10 feet  on uneven surface  activity   Assist Walk 10 feet on uneven surfaces activity did not occur: Safety/medical concerns         Wheelchair     Assist Will patient use wheelchair at discharge?: Yes Type of Wheelchair: Manual    Wheelchair assist level: Minimal Assistance - Patient > 75% Max wheelchair distance: 150'    Wheelchair 50 feet with 2 turns activity    Assist        Assist Level: Supervision/Verbal cueing   Wheelchair 150 feet activity     Assist      Assist Level: Supervision/Verbal cueing   Blood pressure (!) 130/57, pulse 72, temperature 97.6 F  (36.4 C), temperature source Oral, resp. rate 19, height 4\' 8"  (1.422 m), weight 76.8 kg, SpO2 98 %.  Medical Problem List and Plan: 1.Impaired function/ADLs/mobilitysecondary to B/L BKAs due to osteomyelitis and gangrene- s/p IV ABX- completed 3/18  Continue CIR  2. Antithrombotics: -DVT/anticoagulation:Pharmaceutical:Heparindue to PD/ESRD -antiplatelet therapy:  3. Pain Management:Hydrocodone as needed- per pt, pain meds are suitable/working  3/29- pain up to 6/10 today -was 12/10 when fell- looks good- won't call surgeon unless drains. 4. Mood: LCSW to follow for evaluation and support -antipsychotic agents:  5. Neuropsych: This patientiscapable of making decisions onhis own behalf. 6. Skin/Wound Care:   Will need to monitor incisions closely given recent fall  3/29- will place sign reminded pt that doesn't have legs, don't stand.  7. Fluids/Electrolytes/Nutrition:Strict I's and O's. Monitor weights daily after PD. 8.  ESRD: Continue CCPD daily.Is oliguric, not anuric- voids ~2x/day 9.Acute on chronic anemia: On ESA weekly.   Hemoglobin 7.4 on 3/26, continue to monitor 10.Nutrition: Severe protein malnutrition-Renal/carb modified diet to continue with prostat due to low calorie/proteinmalnutrition. 11. EOF:HQRFXJO blood pressures twice daily. Off hydralazine and furosemide at this time.   Con't clonidine and Norvasc   Controlled on 3/28 12.T2DM with hyperglycemia: A1c 7.8-Continue Lantus daily--d/c meal coverage due to hypoglycemic episodes.Monitor blood sugars AC at bedtime for now due to hypoglycemia.   CBG (last 3)  Recent Labs    04/05/19 1643 04/05/19 2055 04/06/19 0424  GLUCAP 109* 213* 217*   Lantus decreased to 15 units on 3/27 (already received a.m. dose)  Labile with hypoglycemia on 3/28.  Will need to monitor closely overnight.  Bedtime snack ordered for tonight.  3/29- BGs [phone]- will call DM coordinator to  help.  13. Phantom itching- might benefit from nerve pain meds- low dose.  Consider medications if necessary 14. Leukocytosis: Resolved  LOS: 6 days A FACE TO FACE EVALUATION WAS PERFORMED  Anacarolina Evelyn 04/06/2019, 10:49 AM

## 2019-04-06 NOTE — Progress Notes (Signed)
Follow up CBC with H/H 7.0/22.6.  Hgb has been ranging around 7.4- 7.6/8.0 range. WBC back up to 13.1.  Will recheck in am to monitor for trend.    Right residual limb.     Left residual limb.

## 2019-04-06 NOTE — Progress Notes (Signed)
Occupational Therapy Session Note  Patient Details  Name: Jorge [name] MRN: [id number] Date of Birth: November 18, 1970  Today's Date: 04/06/2019 OT Individual Time: 4098-1191 OT Individual Time Calculation (min): 69 min    Short Term Goals: Week 1:  OT Short Term Goal 1 (Week 1): Pt will transfer wiht CGA to BSC/toilet OT Short Term Goal 2 (Week 1): Pt will don pants wiht CGA using lateral leans OT Short Term Goal 3 (Week 1): Pt will manage pants prior to toileting wiht CGA  Skilled Therapeutic Interventions/Progress Updates:    Patient in bed, denies pain, on bed pan - assisted with removing bed pan and hygiene - he is able to complete CM by rolling side to side.  Supine to sitting edge of bed with CS.  SB transfer to w/c CGA.  UB bathing and dressing, grooming/oral care with set up w/c level.  He is able to donn bilateral LE limb guards with set up.  W/c set up (managing leg rests/arm rests/brakes with min cues)   He propels w/c to/from therapy gym CS and increased time.  SB transfer to/from mat with CGA.  Returned to room due to need for another void.  SB transfer to drop arm commode CGA - CM min A via lean side to side.  SB transfer commode to bed CGA where he remained at close of session, bed alarm set and call bell in reach.    Therapy Documentation Precautions:  Precautions Precautions: Fall Precaution Comments: wound VACs bilateral residual limbs, limb protectors Restrictions Weight Bearing Restrictions: Yes RLE Weight Bearing: Non weight bearing LLE Weight Bearing: Non weight bearing General:   Vital Signs: Therapy Vitals Temp: 97.6 F (36.4 C) Temp Source: Oral Pulse Rate: 72 Resp: 19 BP: (!) 130/57 Patient Position (if appropriate): Lying Oxygen Therapy SpO2: 98 % O2 Device: Room Air   Therapy/Group: Individual Therapy  Carlos Levering 04/06/2019, 7:49 AM

## 2019-04-06 NOTE — Progress Notes (Signed)
Orthopedic Tech Progress Note Patient Details:  XZAVION DOSWELL 1970-01-10 [id number] Called in order to [employer] for BLE BK SHRINKERS Patient ID: Cecilie Kicks, male   DOB: 01-17-1970, 49 y.o.   MRN: [id number]   Janit Pagan 04/06/2019, 9:37 AM

## 2019-04-07 ENCOUNTER — Inpatient Hospital Stay (HOSPITAL_COMMUNITY): Payer: Medicare (Managed Care)

## 2019-04-07 ENCOUNTER — Inpatient Hospital Stay (HOSPITAL_COMMUNITY): Payer: Medicare (Managed Care) | Admitting: Occupational Therapy

## 2019-04-07 ENCOUNTER — Encounter (HOSPITAL_COMMUNITY): Payer: Medicare (Managed Care) | Admitting: Psychology

## 2019-04-07 LAB — CBC WITH DIFFERENTIAL/PLATELET
Abs Immature Granulocytes: 0.07 10*3/uL (ref 0.00–0.07)
Basophils Absolute: 0.1 10*3/uL (ref 0.0–0.1)
Basophils Relative: 1 %
Eosinophils Absolute: 0.4 10*3/uL (ref 0.0–0.5)
Eosinophils Relative: 5 %
HCT: 24.9 % — ABNORMAL LOW (ref 39.0–52.0)
Hemoglobin: 7.8 g/dL — ABNORMAL LOW (ref 13.0–17.0)
Immature Granulocytes: 1 %
Lymphocytes Relative: 21 %
Lymphs Abs: 1.9 10*3/uL (ref 0.7–4.0)
MCH: 30.2 pg (ref 26.0–34.0)
MCHC: 31.3 g/dL (ref 30.0–36.0)
MCV: 96.5 fL (ref 80.0–100.0)
Monocytes Absolute: 0.9 10*3/uL (ref 0.1–1.0)
Monocytes Relative: 10 %
Neutro Abs: 5.5 10*3/uL (ref 1.7–7.7)
Neutrophils Relative %: 62 %
Platelets: 221 10*3/uL (ref 150–400)
RBC: 2.58 MIL/uL — ABNORMAL LOW (ref 4.22–5.81)
RDW: 15 % (ref 11.5–15.5)
WBC: 8.9 10*3/uL (ref 4.0–10.5)
nRBC: 0 % (ref 0.0–0.2)

## 2019-04-07 LAB — RENAL FUNCTION PANEL
Albumin: 1.7 g/dL — ABNORMAL LOW (ref 3.5–5.0)
Anion gap: 18 — ABNORMAL HIGH (ref 5–15)
BUN: 111 mg/dL — ABNORMAL HIGH (ref 6–20)
CO2: 21 mmol/L — ABNORMAL LOW (ref 22–32)
Calcium: 8.8 mg/dL — ABNORMAL LOW (ref 8.9–10.3)
Chloride: 92 mmol/L — ABNORMAL LOW (ref 98–111)
Creatinine, Ser: 13.48 mg/dL — ABNORMAL HIGH (ref 0.61–1.24)
GFR calc Af Amer: 4 mL/min — ABNORMAL LOW (ref >60)
GFR calc non Af Amer: 4 mL/min — ABNORMAL LOW (ref >60)
Glucose, Bld: 190 mg/dL — ABNORMAL HIGH (ref 70–99)
Phosphorus: 5.7 mg/dL — ABNORMAL HIGH (ref 2.5–4.6)
Potassium: 5.3 mmol/L — ABNORMAL HIGH (ref 3.5–5.1)
Sodium: 131 mmol/L — ABNORMAL LOW (ref 135–145)

## 2019-04-07 LAB — GLUCOSE, CAPILLARY
Glucose-Capillary: 209 mg/dL — ABNORMAL HIGH (ref 70–99)
Glucose-Capillary: 180 mg/dL — ABNORMAL HIGH (ref 70–99)
Glucose-Capillary: 169 mg/dL — ABNORMAL HIGH (ref 70–99)
Glucose-Capillary: 141 mg/dL — ABNORMAL HIGH (ref 70–99)
Glucose-Capillary: 89 mg/dL (ref 70–99)
Glucose-Capillary: 189 mg/dL — ABNORMAL HIGH (ref 70–99)

## 2019-04-07 MED ORDER — SODIUM ZIRCONIUM CYCLOSILICATE 10 G PO PACK
10.0000 g | PACK | Freq: Once | ORAL | Status: AC
  Administered 2019-04-07: 10 g via ORAL
  Filled 2019-04-07: qty 1

## 2019-04-07 MED ORDER — INSULIN GLARGINE 100 UNIT/ML ~~LOC~~ SOLN
18.0000 [IU] | Freq: Every day | SUBCUTANEOUS | Status: DC
  Administered 2019-04-08 – 2019-04-11 (×4): 18 [IU] via SUBCUTANEOUS
  Filled 2019-04-07 (×4): qty 0.18

## 2019-04-07 MED ORDER — INSULIN GLARGINE 100 UNIT/ML ~~LOC~~ SOLN
3.0000 [IU] | SUBCUTANEOUS | Status: AC
  Administered 2019-04-07: 3 [IU] via SUBCUTANEOUS
  Filled 2019-04-07: qty 0.03

## 2019-04-07 NOTE — Progress Notes (Signed)
Physical Therapy Session Note  Patient Details  Name: Jorge [name] MRN: [id number] Date of Birth: 03-25-1970  Today's Date: 04/07/2019 PT Individual Time: 0906-1000 PT Individual Time Calculation (min): 54 min   Short Term Goals: Week 1:  PT Short Term Goal 1 (Week 1): STG = LTG due to short ELOS.  Skilled Therapeutic Interventions/Progress Updates:    pt reporting not feeling very well today, more tired. Reports he feels like his blood sugar levels may be elevated. Notified RN/NT and had CBG taken which was within range (169). Pt still willing and eager for therapy session. Performed bed mobility at mod I to supervision level for safety due to impaired balance and high fall risk when scooting on EOB and during transition initially. Discussed various transfer options and pt reported occurrence of fall the other day. Educated and discussed practicing A/P transfers to decrease fall risk as another transfer method and way to decrease need to also manage slideboard. Pt in agreement and willing to practice. Performed posterior transfer OOB with min to mod assist due to pt tendency for posterior lean and decreased balance reactions. Cues for anterior weigthshift and to walk hips back vs lifting up if UE's aren't strong enough yet. Performed anterior transfer to mat table with walking hips technique with cues for lateral leans and pt able to perform with min assist and improved technique. On mat table engaged in supine UE therex including chest press with 5# straight weight, 2# tricep extensions, and shoulder flexion wit 5# straight weight to 90 degrees x 20 reps each with rest breaks. Discussed energy conservation techniques and self pacing with activity. Performed posterior transfer back to w/c with min to CGA and improved overall technique. Pt able to self propel w/c mod I during session. Requires extra time for management of legrests and low frustration tolerance. Pt request to return back to bed and use  bedpan (declined BSC at this time due to issues yesterday), performed min assist transfer via slideboard to bed and repositioned in supine with bed pan. Call light in reach.   Therapy Documentation Precautions:  Precautions Precautions: Fall Precaution Comments: shrinkers on bilateral residual limbs, limb protectors Restrictions Weight Bearing Restrictions: No RLE Weight Bearing: Non weight bearing LLE Weight Bearing: Non weight bearing  Pain: Reports some generalized discomfort but denies pain.    Therapy/Group: Individual Therapy  Canary Brim Ivory Broad, PT, DPT, CBIS  04/07/2019, 11:55 AM

## 2019-04-07 NOTE — Progress Notes (Signed)
Occupational Therapy Session Note  Patient Details  Name: LAWTON DOLLINGER MRN: [id number] Date of Birth: 26-Apr-1970  Today's Date: 04/07/2019 OT Individual Time: 2694-8546 OT Individual Time Calculation (min): 56 min    Short Term Goals: Week 1:  OT Short Term Goal 1 (Week 1): Pt will transfer wiht CGA to BSC/toilet OT Short Term Goal 2 (Week 1): Pt will don pants wiht CGA using lateral leans OT Short Term Goal 3 (Week 1): Pt will manage pants prior to toileting wiht CGA  Skilled Therapeutic Interventions/Progress Updates:    Patient in bed, able to remove bed pan after unsuccessful attempt to void and complete clothing management via rolling side to side.  He denies pain but states that he is quite fatigued.  Supine to sitting edge of bed with min A.  SB transfer to/from bed, w/c and mat table CGA this session with CS / min cues for management of leg rests, brakes and board placement .  reviewed importance of clearing buttocks during transfer and weight shift.  Completed UB bathing and grooming w/c level with set up/CS.  He is able to propel w/c to therapy gym.  Completed core strength and mobility activities mat level with focus on posture and balance.  Returned to bed at close of session, bed alarm set and call bell in reach.    Therapy Documentation Precautions:  Precautions Precautions: Fall Precaution Comments: wound VACs bilateral residual limbs, limb protectors Restrictions Weight Bearing Restrictions: No RLE Weight Bearing: Non weight bearing LLE Weight Bearing: Non weight bearing General:   Vital Signs: Therapy Vitals Temp: 98.7 F (37.1 C) Temp Source: Oral Pulse Rate: 69 Resp: 18 BP: (!) 131/58 Patient Position (if appropriate): Lying Oxygen Therapy SpO2: 100 % O2 Device: Room Air Other Treatments:     Therapy/Group: Individual Therapy  Carlos Levering 04/07/2019, 7:39 AM

## 2019-04-07 NOTE — Patient Care Conference (Signed)
Inpatient RehabilitationTeam Conference and Plan of Care Update Date: 04/07/2019   Time: 11:30 AM    Patient Name: Jorge [name]      Medical Record Number: [id number]  Date of Birth: 28-Nov-1970 Sex: Male         Room/Bed: 4W08C/4W08C-01 Payor Info: Payor: GENERIC MEDICARE ADVANTAGE / Plan: GENERIC MEDICARE ADVANTAGE / Product Type: *No Product type* /    Admit Date/Time:  03/31/2019  4:18 PM  Primary Diagnosis:  S/P BKA (below knee amputation) bilateral [facility])  Patient Active Problem List   Diagnosis Date Noted  . Fall   . Hypoglycemia   . Labile blood glucose   . Anemia of chronic disease   . Acute blood loss anemia   . S/P BKA (below knee amputation) bilateral ([place]) 03/31/2019  . Below knee amputation ([place]) 03/31/2019  . Severe protein-calorie malnutrition ([place]) 03/20/2019  . ESRD on peritoneal dialysis ([place])   . ESRD (end stage renal disease) ([employer]) 03/19/2019  . Hypertension   . Insulin-requiring or dependent type II diabetes mellitus ([place])   . Normocytic anemia     Expected Discharge Date: Expected Discharge Date: 04/15/19  Team Members Present: Physician leading conference: Dr. Courtney Heys Care Coodinator Present: Loralee Pacas, LCSWA;Genie Afton Mikelson, RN, MSN Nurse Present: Rayne Du, LPN PT Present: Apolinar Junes, PT OT Present: Elisabeth Most, OT SLP Present: Colon Flattery, SLP PPS Coordinator present : Gunnar Fusi, SLP     Current Status/Progress Goal Weekly Team Focus  Bowel/Bladder   Patient is continent of B&B, LBM 3/29  Patient to remain continent of B &B  Q2 toileting and prn   Swallow/Nutrition/ Hydration             ADL's   min A toileting/hygiene and CM, set up UB/LB dressing, mod I grooming seated, SB transfers CG/min A  CS/set up  family education, adl training, transfer training   Mobility   Supervision bed mobility, min A transfers, supervision w/c mobility/parts managment  Supervision transfers, mod I household w/c mobility and  supervision in community  Bed mobility, transfer training, w/c mobility/parts managment, strengthening/ROM, amputee education, patient/caregiver education   Communication             Safety/Cognition/ Behavioral Observations            Pain   Patient has no  c/o of  pain, rates pain 0 out of 10  Pain < or =2  Assess pain Q shift and provide pain medications as requested   Skin   Bilateral BKA with staples and stump shrinkers; peritoneal catheter. no signs of skin breakdown or infection  Patient to remain free of skin breakdown of infection  Assess skin q shiftr and as needed    Rehab Goals Patient on target to meet rehab goals: Yes *See Care Plan and progress notes for long and short-term goals.     Barriers to Discharge  Current Status/Progress Possible Resolutions Date Resolved   Nursing                  PT  Home environment access/layout;Lack of/limited family support  Home entry level to basement with 1/2 bath, otherwise 3-4 STE, and family available PRN.              OT                  SLP                SW Decreased caregiver support;Lack of/limited family  support;Insurance for SNF coverage;Other (comments) If pt requires SNF, possible barriers will be his current insurance plan since his Medicare plan is not an [place] Medicare plan. Pt is also on peritoneal dialysis and SNF will not accept pt.            Discharge Planning/Teaching Needs:  Unsure on d/c location. Pt sister states she and his children are unable to provide 24/7 care due to work schedules. Adult children are in college and work as well.  Family education as recommended by therapy.   Team Discussion: MD B BKA, on PD, monitoring labs.  RN fell few days ago, cont B/B.  OT LB D bed level, BSC min A, is weak, self care in w/c mod I, transfers Slide board CGA.  PT S bed, transfers S, w/c mobility mod I with cues, don/doff shrinker with assist.  Could stay at sister's home in the basement.   Revisions to Treatment  Plan: N/A     Medical Summary Current Status: fall the other day; incisons look good- limb protectors to protect BKAs Weekly Focus/Goal: LB dressing bedlevel; min assist on BSC to get pants on; weak core; self care SBA; biggest complaint fatigue  Barriers to Discharge: Home enviroment access/layout;Decreased family/caregiver support;Hemodialysis;Weight;Weight bearing restrictions   Possible Resolutions to Barriers: PT- Supervision bed level; transfers SBA -CGA due to balance with SB transfers; w/c mod I; cues for leg rests; donning shrinkers and doffing- mod assist   Continued Need for Acute Rehabilitation Level of Care: The patient requires daily medical management by a physician with specialized training in physical medicine and rehabilitation for the following reasons: Direction of a multidisciplinary physical rehabilitation program to maximize functional independence : Yes Medical management of patient stability for increased activity during participation in an intensive rehabilitation regime.: Yes Analysis of laboratory values and/or radiology reports with any subsequent need for medication adjustment and/or medical intervention. : Yes   I attest that I was present, lead the team conference, and concur with the assessment and plan of the team.   Retta Diones 04/07/2019, 7:49 PM   Team conference was held via web/ teleconference due to [place] - 19

## 2019-04-07 NOTE — Progress Notes (Signed)
Social Work Patient ID: Jorge [name], male   DOB: 07-11-1970, 49 y.o.   MRN: [id number]   SW met with pt in room to provide updates from team conference, and d/c date 04/15/2019. Pt states he has decided after speaking with family the best plan is for him to go to short term rehab. SW to come back to room later to discuss insurance changes since pt had therapy.  *SW met with pt to discuss possible barriers with SNF placement and/or HH services due to insurance being with another provider (out of state plan). Pt sister was called during conversation. Pt and sister intend to call later this evening to change over to new insurance plan. SW reminded that his Medicaid will not be active in [place] and he will need to apply with local county. Pt confirms he understands. SW to follow up with pt tomorrow to discuss insurance.   Jorge [name], MSW, [place] Office: [phone] Cell: [phone] Fax: [phone]

## 2019-04-07 NOTE — Progress Notes (Signed)
[facility] Progress Note   Dialysis Orders: [facility] CCPD 7 Days weekly 80 kg 4 exchanges 1.6 mls Dwell time 1.5 hr, No pause or day dwells. Uses mostly 2.5% solution  BMD meds: -Renvela 800 mg 3 tabs PO TID with meals -Calcitriol 0.5 mcg PO TIW (MWF) -   Assessment/Plan: 1. 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 In CIR.  2. DM/Hyperglycemia: Per primary  3. ESRD - Azotemic, I suspect from protein intake and marginal PD Rx.  Increased vol to 2.3L on 3/27, kept 4 exchanges. Increased dwell time to 2 hrs. MIght need to be more cont wet as well (more wet during day) if this is an option.  lokelma x 1 today for K.  Would continue docusate to optimize bowels. 4. Hypertension/volume -BP well controlled.Notantihypertensive meds as OP. Amlodipine startedhere on3/13 Significantly under edw, down to 67.9kgon 3/19.Does not appear volume overload. Will need new edw at d/c. 5. Anemia - HGB initially 7.7 fell to 5.4 on3/12 and transfused two units PRBCs.  Update CBC.Heme neg 3/11 Ferritin still increased post surgery to 1420 with drop in tsat to 15% - s/p  a short course of IV Fe (250 x 2) on Aranesp 200/ week - given qThurs. Transfuse as indicated. CTM.  6. Metabolic bone disease -CCa in goal.  hyperphos - addedbinders w/snacks.- increased meal binders to 4 ac ContinueVDRA.  7. Nutrition - Albumin low prostat, renal vits.-Renal/carb modified diet.     Subjective:    States PD has gone ok with the fill volume increase.  had 800 mL UOP on 3/29.  UF is charted as 2838 mL for 3/30 AM with all 2.5%.  Usually uses all 2.5% at home.  About to work with OT - tired.  Had 2 BM's yesterday after had been constipated  Review of systems:  Denies shortness of breath  Denies chest pain  Denies n/v    Objective Vitals:   04/06/19 1730 04/06/19 2010 04/07/19 0411 04/07/19 0710  BP: (!) 145/76 (!) 124/59 (!) 131/58 (!) 143/75   Pulse: 75 72 69 70  Resp: 18 18 18 19   Temp: 97.6 F (36.4 C) 97.9 F (36.6 C) 98.7 F (37.1 C) 98.1 F (36.7 C)  TempSrc: Oral Oral Oral Oral  SpO2: 98% 99% 100% 100%  Weight: 70.1 kg  71.4 kg 68.3 kg  Height:       Physical Exam General:slender male NAD on room air in NAD HEENT NCAT  Heart: S1S2; no rub Lungs: clear and unlabored  Abdomen: soft nontender nondistended  Extremities:bilateral BKA ; no pitting edema residual limbs Dialysis Access:  PD cath dressing dry and intact   Additional Objective Labs: Basic Metabolic Panel: Recent Labs  Lab 04/03/19 0650 04/06/19 0924 04/07/19 0517  NA 131* 129* 131*  K 5.0 5.1 5.3*  CL 88* 87* 92*  CO2 24 22 21*  GLUCOSE 118* 189* 190*  BUN 112* 116* 111*  CREATININE 13.27* 13.35* 13.48*  CALCIUM 8.5* 8.8* 8.8*  PHOS 7.1* 5.8* 5.7*   Liver Function Tests: Recent Labs  Lab 04/03/19 0650 04/06/19 0924 04/07/19 0517  ALBUMIN 1.8* 1.8* 1.7*   No results for input(s): LIPASE, AMYLASE in the last 168 hours. CBC: Recent Labs  Lab 04/03/19 0650 04/03/19 0650 04/06/19 0924 04/06/19 1344 04/07/19 0517  WBC 9.4   < > 11.2* 13.1* 8.9  NEUTROABS 6.1  --   --   --  5.5  HGB 7.4*   < >  5.9* 7.0* 7.8*  HCT 23.5*   < > 17.9* 22.6* 24.9*  MCV 95.9  --  91.8 97.4 96.5  PLT 216   < > 259 223 221   < > = values in this interval not displayed.   Blood Culture    Component Value Date/Time   SDES BLOOD SITE NOT SPECIFIED 03/19/2019 2016   SPECREQUEST  03/19/2019 2016    BOTTLES DRAWN AEROBIC AND ANAEROBIC Blood Culture adequate volume   CULT  03/19/2019 2016    NO GROWTH 5 DAYS Performed at [facility], [place] [address], [place], [place] [address]    REPTSTATUS 03/24/2019 FINAL 03/19/2019 2016    Cardiac Enzymes: No results for input(s): CKTOTAL, CKMB, CKMBINDEX, TROPONINI in the last 168 hours. CBG: Recent Labs  Lab 04/06/19 1706 04/06/19 2133 04/07/19 0407 04/07/19 0929 04/07/19 1141  GLUCAP 225* 209*  180* 169* 141*   Iron Studies:  No results for input(s): IRON, TIBC, TRANSFERRIN, FERRITIN in the last 72 hours. No results found for: INR, PROTIME Studies/Results: No results found. Medications: . dialysis solution 1.5% low-MG/low-CA    . dialysis solution 2.5% low-MG/low-CA     . amLODipine  10 mg Oral Daily  . aspirin EC  81 mg Oral Daily  . atorvastatin  20 mg Oral q1800  . calcitRIOL  0.5 mcg Oral Once per day on Mon Wed Fri  . cloNIDine  0.1 mg Oral QHS  . [START ON 04/09/2019] darbepoetin (ARANESP) injection - NON-DIALYSIS  200 mcg Subcutaneous Q Thu-1800  . docusate sodium  100 mg Oral BID  . feeding supplement (PRO-STAT SUGAR FREE 64)  30 mL Oral TID BM  . gabapentin  100 mg Oral QHS  . gentamicin cream  1 application Topical Daily  . heparin  5,000 Units Subcutaneous Q8H  . insulin aspart  0-5 Units Subcutaneous QHS  . insulin aspart  0-6 Units Subcutaneous TID WC  . [START ON 04/08/2019] insulin glargine  18 Units Subcutaneous Daily  . multivitamin  1 tablet Oral QHS  . sevelamer carbonate  3,200 mg Oral TID WC       Claudia Desanctis 04/07/2019 1:17 PM

## 2019-04-07 NOTE — Progress Notes (Signed)
Occupational Therapy Session Note  Patient Details  Name: Jorge [name] MRN: [id number] Date of Birth: [birth year]  Today's Date: 04/07/2019 OT Individual Time: 1300-1400 OT Individual Time Calculation (min): 60 min    Short Term Goals: Week 1:  OT Short Term Goal 1 (Week 1): Pt will transfer wiht CGA to BSC/toilet OT Short Term Goal 2 (Week 1): Pt will don pants wiht CGA using lateral leans OT Short Term Goal 3 (Week 1): Pt will manage pants prior to toileting wiht CGA  Skilled Therapeutic Interventions/Progress Updates:    Patient in bed, alert and ready for therapy, he denies pain.   Supine to sitting with CS.  SB transfer bed to w/c with CGA - he is able to manage leg rests and brakes independently.  UB ergometer 2 x 5 minutes.  W/c pushups x 6 reps, unable to clear buttocks due to fatigue.  Returned to room - able to void using urinal mod I seated in w/c.  SB transfer to mat table with CGA.  Completed supine scapular strengthening exercises with 2# dowel, abdominal exercises with rest needs between each 3 reps.  He remained in therapy gym - PT session to start back to back with this session.    Therapy Documentation Precautions:  Precautions Precautions: Fall Precaution Comments: wound VACs bilateral residual limbs, limb protectors Restrictions Weight Bearing Restrictions: No RLE Weight Bearing: Non weight bearing LLE Weight Bearing: Non weight bearing   Therapy/Group: Individual Therapy  Carlos Levering 04/07/2019, 1:20 PM

## 2019-04-07 NOTE — Progress Notes (Signed)
Inpatient Diabetes Program Recommendations  AACE/ADA: New Consensus Statement on Inpatient Glycemic Control   Target Ranges:  Prepandial:   less than 140 mg/dL      Peak postprandial:   less than 180 mg/dL (1-2 hours)      Critically ill patients:  140 - 180 mg/dL   Results for RUE, TINNEL (MRN [id number]) as of 04/07/2019 07:37  Ref. Range 04/06/2019 04:24 04/06/2019 12:19 04/06/2019 17:06 04/07/2019 04:07  Glucose-Capillary Latest Ref Range: 70 - 99 mg/dL 217 (H) 257 (H) 225 (H) 180 (H)   Review of Glycemic Control  Diabetes history:DM2 Outpatient Diabetes medications: Lantus 20 units daily, Novolog 0-15 units TID with meals Current orders for Inpatient glycemic control: Lantus 15 units daily, Novolog 0-6 units TID with meals, Novolog 0-5 units QHS  Inpatient Diabetes Program Recommendations:   Insulin - Basal: Please consider increasing Lantus to 18 units daily to start 04/08/19. If agreeable, please also order one time Lantus 3 units x1 now (since patient has already received Lantus 15 units this morning at 7:23 am).  Thanks, Barnie Alderman, RN, MSN, CDE Diabetes Coordinator Inpatient Diabetes Program [phone] (Team Pager from 8am to 5pm)

## 2019-04-07 NOTE — Progress Notes (Signed)
Physical Therapy Session Note  Patient Details  Name: Jorge [name] MRN: [id number] Date of Birth: 12-Jun-1970  Today's Date: 04/07/2019 PT Individual Time: 1404-1430 PT Individual Time Calculation (min): 26 min   Short Term Goals: Week 1:  PT Short Term Goal 1 (Week 1): STG = LTG due to short ELOS.  Skilled Therapeutic Interventions/Progress Updates:     Patient lying on mat table in the Day room when handed off from [place], [place] upon PT arrival. Patient alert and agreeable to PT session. Patient denied pain during session. Stated "I won't tell you if I do so I don't have to take pain meds that will constipate me." Provided education about importance of staff's awareness of patient's pain level and patient's ability to refuse pain medication at any time. Also educated on importance of pain management to participate with therapies and to continue discussing bowl medications and regiment with nursing and medical staff. Patient stated understanding and was appreciative of education.  Therapeutic Activity: Bed Mobility: Patient performed supine to sit on the mat table and sit to supine in a flat bed without use of bed rails with supervision-mod I. Transfers: Patient performed a slide board transfers mat table>w/c and w/c>bed with close supervision and supervision for board placement. Provided cues for w/c set up, hand placement, and board placement.   Wheelchair Mobility:  Patient propelled wheelchair ~125 feet with mod I. Patient managed B amputee pads independently with increased time and breaks with min cues throughout session.  Therapeutic Exercise: Patient performed the following exercises with verbal and tactile cues for proper technique. -B quad sets 2x10 -B SLR x10 -B prone hip extension x10 -B side-lying hip abduction x10 Educated on lying in prone 15 min/day and use of HEP to perform exercises 1x/day outside of therapy for improved LE strength and ROM. Patient stated  understanding.  Patient in bed at end of session with breaks locked, bed alarm set, and all needs within reach.    Therapy Documentation Precautions:  Precautions Precautions: Fall Precaution Comments: wound VACs bilateral residual limbs, limb protectors Restrictions Weight Bearing Restrictions: No RLE Weight Bearing: Non weight bearing LLE Weight Bearing: Non weight bearing    Therapy/Group: Individual Therapy  Gagan Dillion L Rhyse Skowron PT, DPT  04/07/2019, 5:09 PM

## 2019-04-07 NOTE — Progress Notes (Addendum)
[facility] PHYSICAL MEDICINE & REHABILITATION PROGRESS NOTE   Subjective/Complaints:  Pt reports had 2 extra large BMs- doesn't want more bowel meds.  Jorge loose now.    ROS:   Pt denies SOB, abd pain, CP, N/V/C/D, and vision changes    Objective:   No results found. Recent Labs    04/06/19 1344 04/07/19 0517  WBC 13.1* 8.9  HGB 7.0* 7.8*  HCT 22.6* 24.9*  PLT 223 221   Recent Labs    04/06/19 0924 04/07/19 0517  NA 129* 131*  K 5.1 5.3*  CL 87* 92*  CO2 22 21*  GLUCOSE 189* 190*  BUN 116* 111*  CREATININE 13.35* 13.48*  CALCIUM 8.8* 8.8*    Intake/Output Summary (Last 24 hours) at 04/07/2019 0946 Last data filed at 04/07/2019 8299 Gross per 24 hour  Intake 240 ml  Output 800 ml  Net -560 ml     Physical Exam: Vital Signs Blood pressure (!) 143/75, pulse 70, temperature 98.1 F (36.7 C), temperature source Oral, resp. rate 19, height 4\' 8"  (1.422 m), weight 68.3 kg, SpO2 100 %.  Gen: awake, alert, appropriate, sitting up in bed, needs to void, NAD CV: RRR Pulm; CTA B/L- good air movement GI: soft, NT, ND; (+)BS Skin: Bilateral BKA's - L BKA puffy skin not completely attached (pulled by shrinker)  At midline- surrounded by dried blood- nothing draining actively- no erythema- nothing actually open. - not assessed today Psych: appropriate Musc: Bilateral BKA's. Neuro: Ox3 Motor:  Bilateral lower extremities: Hip flexion, knee extension 5/5, unchanged  Assessment/Plan: 1. Functional deficits secondary to B/L BKAs due to gangrene/osteomyelitis which require 3+ hours per day of interdisciplinary therapy in a comprehensive inpatient rehab setting.  Physiatrist is providing close team supervision and 24 hour management of active medical problems listed below.  Physiatrist and rehab team continue to assess barriers to discharge/monitor patient progress toward functional and medical goals  Care Tool:  Bathing    Body parts bathed by patient: Right  arm, Left arm, Chest, Abdomen, Face, Front perineal area, Right upper leg, Left upper leg   Body parts bathed by helper: Buttocks Body parts n/a: Right lower leg, Left lower leg   Bathing assist Assist Level: Minimal Assistance - Patient > 75%     Upper Body Dressing/Undressing Upper body dressing   What is the patient wearing?: Pull over shirt    Upper body assist Assist Level: Contact Guard/Touching assist    Lower Body Dressing/Undressing Lower body dressing      What is the patient wearing?: Underwear/pull up, Pants     Lower body assist Assist for lower body dressing: Moderate Assistance - Patient 50 - 74%     Toileting Toileting    Toileting assist Assist for toileting: Minimal Assistance - Patient > 75%     Transfers Chair/bed transfer  Transfers assist     Chair/bed transfer assist level: Minimal Assistance - Patient > 75%     Locomotion Ambulation   Ambulation assist   Ambulation activity did not occur: Safety/medical concerns          Walk 10 feet activity   Assist  Walk 10 feet activity did not occur: Safety/medical concerns        Walk 50 feet activity   Assist Walk 50 feet with 2 turns activity did not occur: Safety/medical concerns         Walk 150 feet activity   Assist Walk 150 feet activity did not occur: Safety/medical concerns  Walk 10 feet on uneven surface  activity   Assist Walk 10 feet on uneven surfaces activity did not occur: Safety/medical concerns         Wheelchair     Assist Will patient use wheelchair at discharge?: Yes Type of Wheelchair: Manual    Wheelchair assist level: Minimal Assistance - Patient > 75% Max wheelchair distance: 150'    Wheelchair 50 feet with 2 turns activity    Assist        Assist Level: Supervision/Verbal cueing   Wheelchair 150 feet activity     Assist      Assist Level: Supervision/Verbal cueing   Blood pressure (!) 143/75, pulse  70, temperature 98.1 F (36.7 C), temperature source Oral, resp. rate 19, height 4\' 8"  (1.422 m), weight 68.3 kg, SpO2 100 %.  Medical Problem List and Plan: 1.Impaired function/ADLs/mobilitysecondary to B/L BKAs due to osteomyelitis and gangrene- s/p IV ABX- completed 3/18  Continue CIR  2. Antithrombotics: -DVT/anticoagulation:Pharmaceutical:Heparindue to PD/ESRD -antiplatelet therapy:  3. Pain Management:Hydrocodone as needed- per pt, pain meds are suitable/working  3/30- pain controlled- doesn't want to change due to constipation- wants to take less 4. Mood: LCSW to follow for evaluation and support -antipsychotic agents:  5. Neuropsych: This patientiscapable of making decisions onhis own behalf. 6. Skin/Wound Care:   Will need to monitor incisions closely given recent fall  3/29- will place sign reminded pt that doesn't have legs, don't stand.   3/30- will make sure sign placed 7. Fluids/Electrolytes/Nutrition:Strict I's and O's. Monitor weights daily after PD. 8.  ESRD: Continue CCPD daily.Is oliguric, not anuric- voids ~2x/day 9.Acute on chronic anemia: On ESA weekly.   Hemoglobin 7.4 on 3/26, continue to monitor 10.Nutrition: Severe protein malnutrition-Renal/carb modified diet to continue with prostat due to low calorie/proteinmalnutrition. 11. UJW:JXBJYNW blood pressures twice daily. Off hydralazine and furosemide at this time.   Con't clonidine and Norvasc   Controlled on 3/28 12.T2DM with hyperglycemia: A1c 7.8-Continue Lantus daily--d/c meal coverage due to hypoglycemic episodes.Monitor blood sugars AC at bedtime for now due to hypoglycemia.   CBG (last 3)  Recent Labs    04/06/19 1706 04/07/19 0407 04/07/19 0929  GLUCAP 225* 180* 169*   Lantus decreased to 15 units on 3/27 (already received a.m. dose)  Labile with hypoglycemia on 3/28.  Will need to monitor closely overnight.  Bedtime snack ordered for  tonight.  3/29- BGs [phone]- will call DM coordinator to help.   3/30- DM coord asked me to increase Lantus to 18 units- will order.  13. Phantom itching- might benefit from nerve pain meds- low dose.  Consider medications if necessary 14. Leukocytosis: Resolved  3/30- WBC down to 8.9 from 13 yesterday 15. Hyperkalemia  3/30- up to 5.3- will see if renal will address since renal pt- if not, will try Lokelma.   LOS: 7 days A FACE TO FACE EVALUATION WAS PERFORMED  Jorge [name] 04/07/2019, 9:46 AM

## 2019-04-08 ENCOUNTER — Inpatient Hospital Stay (HOSPITAL_COMMUNITY): Payer: Medicare (Managed Care)

## 2019-04-08 ENCOUNTER — Inpatient Hospital Stay (HOSPITAL_COMMUNITY): Payer: Medicare (Managed Care) | Admitting: Physical Therapy

## 2019-04-08 LAB — GLUCOSE, CAPILLARY
Glucose-Capillary: 170 mg/dL — ABNORMAL HIGH (ref 70–99)
Glucose-Capillary: 132 mg/dL — ABNORMAL HIGH (ref 70–99)
Glucose-Capillary: 114 mg/dL — ABNORMAL HIGH (ref 70–99)
Glucose-Capillary: 156 mg/dL — ABNORMAL HIGH (ref 70–99)

## 2019-04-08 LAB — OCCULT BLOOD X 1 CARD TO LAB, STOOL: Fecal Occult Bld: NEGATIVE

## 2019-04-08 MED ORDER — SODIUM ZIRCONIUM CYCLOSILICATE 10 G PO PACK
10.0000 g | PACK | Freq: Once | ORAL | Status: AC
  Administered 2019-04-08: 10 g via ORAL
  Filled 2019-04-08: qty 1

## 2019-04-08 MED ORDER — SORBITOL 70 % SOLN
60.0000 mL | Freq: Once | Status: AC
  Administered 2019-04-08: 60 mL via ORAL
  Filled 2019-04-08: qty 60

## 2019-04-08 NOTE — Progress Notes (Signed)
Occupational Therapy Session Note  Patient Details  Name: Jorge [name] MRN: [id number] Date of Birth: 11/19/1970  Today's Date: 04/08/2019 OT Individual Time: 5631-4970 OT Individual Time Calculation (min): 32 min    Short Term Goals: Week 1:  OT Short Term Goal 1 (Week 1): Pt will transfer wiht CGA to BSC/toilet OT Short Term Goal 2 (Week 1): Pt will don pants wiht CGA using lateral leans OT Short Term Goal 3 (Week 1): Pt will manage pants prior to toileting wiht CGA  Skilled Therapeutic Interventions/Progress Updates:    1:1. Pt received in bed agreeable to tx after missing 30 min d/t toileting. Pt already received in bed pan and NT in room switching out bedpan d/t urgency and EXTREMELY LARGE VOID (bedpan full of BM). Upon return tp ready to come off bedpan. edu re getting on [facility] for improved voiding/positioning. Pt verbalized understanding. Pt completes rolling to don new brief and shorts with set up. Pt completes lateral scoot transfer with S overall and VC for anterior weight shift/pelvic tilt. Pt completes UB bathing/dressing at sink with set up. Pt reporting need to continue to use [facility] to attempt to void bowel again. Pt completes CGA SB transfer w/c>DAC>EOB with VC for anterior weight shift. Pt able to manage pants past hips prior to toileting and about 50% up hips after. Pt elects to get back to bed to fully advance pants past hips as pt unable to void bowel on BSC. Exited session with pt seated in bed, exit alarm on andc all light tin reach  Therapy Documentation Precautions:  Precautions Precautions: Fall Precaution Comments: wound VACs bilateral residual limbs, limb protectors Restrictions Weight Bearing Restrictions: No RLE Weight Bearing: Non weight bearing LLE Weight Bearing: Non weight bearing General:   Vital Signs:  Pain: Pain Assessment Pain Scale: 0-10 Pain Score: 0-No pain Faces Pain Scale: No hurt ADL: ADL Grooming: Setup Where Assessed-Grooming: Sitting  at sink Upper Body Bathing: Contact guard Where Assessed-Upper Body Bathing: Edge of bed Lower Body Bathing: Minimal assistance Where Assessed-Lower Body Bathing: Bed level Upper Body Dressing: Contact guard Where Assessed-Upper Body Dressing: Edge of bed Lower Body Dressing: Moderate assistance Where Assessed-Lower Body Dressing: Edge of bed Vision   Perception    Praxis   Exercises:   Other Treatments:     Therapy/Group: Individual Therapy  Tonny Branch 04/08/2019, 11:28 AM

## 2019-04-08 NOTE — Progress Notes (Signed)
Physical Therapy Session Note  Patient Details  Name: Jorge [name] MRN: [id number] Date of Birth: [birth year]  Today's Date: 04/08/2019 PT Individual Time: 1300-1415 PT Individual Time Calculation (min): 75 min   Short Term Goals: Week 1:  PT Short Term Goal 1 (Week 1): STG = LTG due to short ELOS.  Skilled Therapeutic Interventions/Progress Updates:     Patient in bed on bedpan providing meal orders upon PT arrival. Patient alert and agreeable to PT session. Patient reported 4-5/10 phantom L foot pain during session, RN made aware. PT provided repositioning, rest breaks, and distraction as pain interventions throughout session. Provided education on phantom pain and desensitization techniques during session. Lattie Haw, RT present for co-treat throughout session.  Therapeutic Activity: Bed Mobility: Patient performed rolling to remove the bed pan independently in the bed. He also performed supine to/from sit independently in a flat bed.  Transfers: Patient performed a slide board transfers bed<>w/c and w/c<>mat table with CGA-close supervision and supervision for board placement. Provided min cues for w/c set up, board placement, and head-hips relationship for proper technique and decreased assist with transfers.   Wheelchair Mobility:  Patient propelled wheelchair ~150 feet with mod I. Managed breaks with min cues and limb pads with min A-supervision with cues for placement throughout session.  Neuromuscular Re-ed: Patient performed the following dynamic balance, UE and core strengthening activities: -seated EOM throwing 2lb ball at rebounder 3x30 sec-1 min and 3x10 reps  Discussed coping strategies for stress management and education on coping with limb loss during rest breaks between trials.  Patient reported that he may need to have a BM and was returned to his room, however, reported that he did not need to go once back in the room. Discussed shrinker use and hygene and patient doffed B  shrinkers that had been donned for 2 days with supervision. PT washed shrinkers while Nephrologist spoke with patient about PD. Then had patient don clean shrinkers with min A due to staples at incision and mod cues for technique.  Patient in bed at end of session with breaks locked, bed alarm set, and all needs within reach.    Therapy Documentation Precautions:  Precautions Precautions: Fall Precaution Comments: wound VACs bilateral residual limbs, limb protectors Restrictions Weight Bearing Restrictions: No RLE Weight Bearing: Non weight bearing LLE Weight Bearing: Non weight bearing    Therapy/Group: Individual Therapy  Jillana Selph L Jhostin Epps PT, DPT  04/08/2019, 8:33 PM

## 2019-04-08 NOTE — Progress Notes (Signed)
Physical Therapy Session Note  Patient Details  Name: Jorge [name] MRN: [id number] Date of Birth: January 21, 1970  Today's Date: 04/08/2019 PT Individual Time: 4742-5956 PT Individual Time Calculation (min): 60 min   Short Term Goals: Week 1:  PT Short Term Goal 1 (Week 1): STG = LTG due to short ELOS.  Skilled Therapeutic Interventions/Progress Updates:    Patient received in bed, pleasant but requesting in room session- "they just gave me sorbitol and with how my stomach is feeling we need to stay close to a bathroom". Refused AP transfer practice today. Able to complete bed mobility with I (using bed rails) to min guard; performed transfer to drop arm BSC but needed MinA for safety to stabilize end of board on slippery commode surface, also due to sticking to board due to shorts riding up and preventing him from sliding well today. Unable to really have significantly productive BM, then transferred back to bed with SBT and S with VC for board and clothing management. From there practiced multiple SBT to and from [facility], able to do so with S and cues for board placement/clothing management, general safety. Worked on tricep pressdowns from [facility] as well as quad and glute sets, SLRs, hamstring curls, prone hip extension, and supine hip ABD/ADD in the bed. Requested to stay in bed for ease of getting himself onto/off of bedpan due to state of his bowels. Left in bed with all needs met, bed alarm active this morning. Education provided regarding exercise form, importance of good breathing technique with exercises and transfers to prevent valsalva, and importance of appropriate hamstring/hip flexor flexibility in order to improve ease of prosthetic use when he gets that to point.   Therapy Documentation Precautions:  Precautions Precautions: Fall Precaution Comments: wound VACs bilateral residual limbs, limb protectors Restrictions Weight Bearing Restrictions: No RLE Weight Bearing: Non weight bearing LLE  Weight Bearing: Non weight bearing   Pain: Pain Assessment Pain Scale: 0-10 Pain Score: 0-No pain Faces Pain Scale: No hurt    Therapy/Group: Individual Therapy  Windell Norfolk, DPT, PN1   Supplemental Physical Therapist [place]    Pager [phone] Acute Rehab Office [phone]   04/08/2019, 10:50 AM
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
## 2019-04-08 NOTE — Progress Notes (Signed)
[facility] Progress Note   Dialysis Orders: [facility] CCPD 7 Days weekly 80 kg 4 exchanges 1.6 mls Dwell time 1.5 hr, No pause or day dwells. Uses mostly 2.5% solution  BMD meds: -Renvela 800 mg 3 tabs PO TID with meals -Calcitriol 0.5 mcg PO TIW (MWF) -   Assessment/Plan: 1. 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 In CIR.  2. DM/Hyperglycemia: Per primary  3. ESRD - Azotemic, suspected from protein intake and marginal PD Rx. Constipation may have contributed as well.  Increased vol to 2.3L on 3/27, kept 4 exchanges. Increased dwell time to 2 hrs. Might need a last bag fill with midday drain if possible.  Got lokelma on 3/30 for K - redose on 3/31.  Would continue docusate to optimize bowels as constipation earlier may have contributed to clearance. Renal panel AM.  1.5% dextrose tonight   4. Hypertension/volume -BP well controlled.Notantihypertensive meds as OP. Amlodipine startedhere on3/13 Significantly under edw, down to 67.9kgon 3/19.Does not appear volume overload. Will need new edw at d/c. 5. Anemia - HGB initially 7.7 fell to 5.4 on3/12 and transfused two units PRBCs.  Update CBC.Heme neg 3/11 Ferritin still increased post surgery to 1420 with drop in tsat to 15% - s/p  a short course of IV Fe (250 x 2) on Aranesp 200/ week - given qThurs. Transfuse as indicated. CTM.  6. Metabolic bone disease -CCa in goal.  hyperphos - addedbinders w/snacks.- increased meal binders to 4 ac ContinueVDRA.  7. Nutrition - Albumin low prostat, renal vits.-Renal/carb modified diet.     Subjective:    States PD has gone ok with the fill volume increase.  Had a large bowel movement today.  Somewhat loose then another one later on.    UF charted as 2151 mL for 3/31 AM  UF is charted as 2838 mL for 3/30 AM with all 2.5%.  Review of systems:   Denies shortness of breath  Denies chest pain  Denies n/v     Objective Vitals:   04/07/19 0710 04/07/19 1634 04/07/19 2038 04/08/19 0559  BP: (!) 143/75 138/76 (!) 124/54 (!) 130/55  Pulse: 70  67 71  Resp: 19 18  16   Temp: 98.1 F (36.7 C) 98.1 F (36.7 C) 97.8 F (36.6 C) 97.9 F (36.6 C)  TempSrc: Oral Oral Oral Oral  SpO2: 100%  95% 100%  Weight: 68.3 kg 67.8 kg    Height:       Physical Exam General:slender male NAD on room air in NAD   HEENT NCAT  Heart: S1S2; no rub Lungs: clear and unlabored  Abdomen: soft nontender nondistended  Extremities:bilateral BKA ; no pitting edema residual limbs Dialysis Access:  PD cath dressing dry and intact   Additional Objective Labs: Basic Metabolic Panel: Recent Labs  Lab 04/03/19 0650 04/06/19 0924 04/07/19 0517  NA 131* 129* 131*  K 5.0 5.1 5.3*  CL 88* 87* 92*  CO2 24 22 21*  GLUCOSE 118* 189* 190*  BUN 112* 116* 111*  CREATININE 13.27* 13.35* 13.48*  CALCIUM 8.5* 8.8* 8.8*  PHOS 7.1* 5.8* 5.7*   Liver Function Tests: Recent Labs  Lab 04/03/19 0650 04/06/19 0924 04/07/19 0517  ALBUMIN 1.8* 1.8* 1.7*   No results for input(s): LIPASE, AMYLASE in the last 168 hours. CBC: Recent Labs  Lab 04/03/19 0650 04/03/19 0650 04/06/19 0924 04/06/19 1344 04/07/19 0517  WBC 9.4   < > 11.2* 13.1* 8.9  NEUTROABS 6.1  --   --   --  5.5  HGB 7.4*   < > 5.9* 7.0* 7.8*  HCT 23.5*   < > 17.9* 22.6* 24.9*  MCV 95.9  --  91.8 97.4 96.5  PLT 216   < > 259 223 221   < > = values in this interval not displayed.   Blood Culture    Component Value Date/Time   SDES BLOOD SITE NOT SPECIFIED 03/19/2019 2016   SPECREQUEST  03/19/2019 2016    BOTTLES DRAWN AEROBIC AND ANAEROBIC Blood Culture adequate volume   CULT  03/19/2019 2016    NO GROWTH 5 DAYS Performed at [facility], [place] [address] [place], [place] [address]    REPTSTATUS 03/24/2019 FINAL 03/19/2019 2016    Cardiac Enzymes: No results for input(s): CKTOTAL, CKMB, CKMBINDEX, TROPONINI in the last 168  hours. CBG: Recent Labs  Lab 04/07/19 1141 04/07/19 1703 04/07/19 2045 04/08/19 0609 04/08/19 1157  GLUCAP 141* 89 189* 170* 132*   Iron Studies:  No results for input(s): IRON, TIBC, TRANSFERRIN, FERRITIN in the last 72 hours. No results found for: INR, PROTIME Studies/Results: No results found. Medications: . dialysis solution 1.5% low-MG/low-CA    . dialysis solution 2.5% low-MG/low-CA     . amLODipine  10 mg Oral Daily  . aspirin EC  81 mg Oral Daily  . atorvastatin  20 mg Oral q1800  . calcitRIOL  0.5 mcg Oral Once per day on Mon Wed Fri  . cloNIDine  0.1 mg Oral QHS  . [START ON 04/09/2019] darbepoetin (ARANESP) injection - NON-DIALYSIS  200 mcg Subcutaneous Q Thu-1800  . docusate sodium  100 mg Oral BID  . feeding supplement (PRO-STAT SUGAR FREE 64)  30 mL Oral TID BM  . gabapentin  100 mg Oral QHS  . gentamicin cream  1 application Topical Daily  . heparin  5,000 Units Subcutaneous Q8H  . insulin aspart  0-5 Units Subcutaneous QHS  . insulin aspart  0-6 Units Subcutaneous TID WC  . insulin glargine  18 Units Subcutaneous Daily  . multivitamin  1 tablet Oral QHS  . sevelamer carbonate  3,200 mg Oral TID WC       Claudia Desanctis 04/08/2019 2:09 PM

## 2019-04-08 NOTE — Progress Notes (Addendum)
[facility] PHYSICAL MEDICINE & REHABILITATION PROGRESS NOTE   Subjective/Complaints:  Pt reports feeling very constipated- was disimpacted last night- and wants a milk and molasses enema today- suggested after receiving Sorbitol. Received Lokelma x1 yesterday for elevated K+.    ROS:   Pt denies SOB, abd pain, CP, N/V/C/D, and vision changes   Objective:   No results found. Recent Labs    04/06/19 1344 04/07/19 0517  WBC 13.1* 8.9  HGB 7.0* 7.8*  HCT 22.6* 24.9*  PLT 223 221   Recent Labs    04/06/19 0924 04/07/19 0517  NA 129* 131*  K 5.1 5.3*  CL 87* 92*  CO2 22 21*  GLUCOSE 189* 190*  BUN 116* 111*  CREATININE 13.35* 13.48*  CALCIUM 8.8* 8.8*    Intake/Output Summary (Last 24 hours) at 04/08/2019 0937 Last data filed at 04/08/2019 0743 Gross per 24 hour  Intake 462 ml  Output 375 ml  Net 87 ml     Physical Exam: Vital Signs Blood pressure (!) 130/55, pulse 71, temperature 97.9 F (36.6 C), temperature source Oral, resp. rate 16, height 4\' 8"  (1.422 m), weight 67.8 kg, SpO2 100 %.  Gen: sitting up in bed; appropriate, NAD CV: RRR- no JVD Pulm: CTA B/L- good air movement GI- soft, but slightly distended; NT; normoactive BS Skin: Bilateral BKA's - L BKA puffy skin not completely attached (pulled by shrinker)  At midline- surrounded by dried blood- nothing draining actively- no erythema- nothing actually open. - not assessed today Psych: appropriate, cordial Musc: B/L BKAs- not wearing limb protectors Neuro: Ox3 Motor:  Bilateral lower extremities: Hip flexion, knee extension 5/5, unchanged  Assessment/Plan: 1. Functional deficits secondary to B/L BKAs due to gangrene/osteomyelitis which require 3+ hours per day of interdisciplinary therapy in a comprehensive inpatient rehab setting.  Physiatrist is providing close team supervision and 24 hour management of active medical problems listed below.  Physiatrist and rehab team continue to assess barriers  to discharge/monitor patient progress toward functional and medical goals  Care Tool:  Bathing    Body parts bathed by patient: Right arm, Left arm, Chest, Abdomen, Face, Front perineal area, Right upper leg, Left upper leg   Body parts bathed by helper: Buttocks Body parts n/a: Right lower leg, Left lower leg   Bathing assist Assist Level: Minimal Assistance - Patient > 75%     Upper Body Dressing/Undressing Upper body dressing   What is the patient wearing?: Pull over shirt    Upper body assist Assist Level: Contact Guard/Touching assist    Lower Body Dressing/Undressing Lower body dressing      What is the patient wearing?: Underwear/pull up, Pants     Lower body assist Assist for lower body dressing: Moderate Assistance - Patient 50 - 74%     Toileting Toileting    Toileting assist Assist for toileting: Minimal Assistance - Patient > 75%     Transfers Chair/bed transfer  Transfers assist     Chair/bed transfer assist level: Minimal Assistance - Patient > 75%     Locomotion Ambulation   Ambulation assist   Ambulation activity did not occur: Safety/medical concerns          Walk 10 feet activity   Assist  Walk 10 feet activity did not occur: Safety/medical concerns        Walk 50 feet activity   Assist Walk 50 feet with 2 turns activity did not occur: Safety/medical concerns         Walk  150 feet activity   Assist Walk 150 feet activity did not occur: Safety/medical concerns         Walk 10 feet on uneven surface  activity   Assist Walk 10 feet on uneven surfaces activity did not occur: Safety/medical concerns         Wheelchair     Assist Will patient use wheelchair at discharge?: Yes Type of Wheelchair: Manual    Wheelchair assist level: Independent Max wheelchair distance: 120'    Wheelchair 50 feet with 2 turns activity    Assist        Assist Level: Independent   Wheelchair 150 feet activity      Assist      Assist Level: Supervision/Verbal cueing   Blood pressure (!) 130/55, pulse 71, temperature 97.9 F (36.6 C), temperature source Oral, resp. rate 16, height 4\' 8"  (1.422 m), weight 67.8 kg, SpO2 100 %.  Medical Problem List and Plan: 1.Impaired function/ADLs/mobilitysecondary to B/L BKAs due to osteomyelitis and gangrene- s/p IV ABX- completed 3/18  Continue CIR  2. Antithrombotics: -DVT/anticoagulation:Pharmaceutical:Heparindue to PD/ESRD -antiplatelet therapy:  3. Pain Management:Hydrocodone as needed- per pt, pain meds are suitable/working  3/30- pain controlled- doesn't want to change due to constipation- wants to take less 4. Mood: LCSW to follow for evaluation and support -antipsychotic agents:  5. Neuropsych: This patientiscapable of making decisions onhis own behalf. 6. Skin/Wound Care:   Will need to monitor incisions closely given recent fall  3/31- needs to sign to remind pt to not try to stand 7. Fluids/Electrolytes/Nutrition:Strict I's and O's. Monitor weights daily after PD. 8.  ESRD: Continue CCPD daily.Is oliguric, not anuric- voids ~2x/day 9.Acute on chronic anemia: On ESA weekly.   Hemoglobin 7.4 on 3/26, continue to monitor  3/31- Last Hb 7/8- will recheck tomorrow 10.Nutrition: Severe protein malnutrition-Renal/carb modified diet to continue with prostat due to low calorie/proteinmalnutrition. 11. DXA:JOINOMV blood pressures twice daily. Off hydralazine and furosemide at this time.   Con't clonidine and Norvasc   Controlled on 3/28 12.T2DM with hyperglycemia: A1c 7.8-Continue Lantus daily--d/c meal coverage due to hypoglycemic episodes.Monitor blood sugars AC at bedtime for now due to hypoglycemia.   CBG (last 3)  Recent Labs    04/07/19 1703 04/07/19 2045 04/08/19 0609  GLUCAP 89 189* 170*   Lantus decreased to 15 units on 3/27 (already received a.m. dose)  Labile with hypoglycemia  on 3/28.  Will need to monitor closely overnight.  Bedtime snack ordered for tonight.  3/29- BGs [phone]- will call DM coordinator to help.   3/30- DM coord asked me to increase Lantus to 18 units- will order.   3/31- BGs a little better- low was 89, which isn't low- con't regimen 13. Phantom itching- might benefit from nerve pain meds- low dose.  Consider medications if necessary 14. Leukocytosis: Resolved  3/30- WBC down to 8.9 from 13 yesterday 15. Hyperkalemia  3/30- up to 5.3- will see if renal will address since renal pt- if not, will try Lokelma.  3/31- Renal ordered Lokelma x1- wil recheck labs in AM 16. Constipation  3/31- will order Sorbitol 60 ml then follow in 3-4 hours with enema.   LOS: 8 days A FACE TO FACE EVALUATION WAS PERFORMED  Dioselina Brumbaugh 04/08/2019, 9:37 AM

## 2019-04-08 NOTE — Progress Notes (Addendum)
Social Work Patient ID: Jorge [name], male   DOB: [birth year], 49 y.o.   MRN: [id number]    SW met with pt in room to follow-up about insurance change. Pt states his insurance will now be traditional Medicare A/B and Rx-[employer]. ID#[id number]. Pt remains amenable to SNF placement. SW to provide [facility] list.   NCPASRR #: [id number] [place], MSW, [place] Office: [phone] Cell: [phone] Fax: [phone]

## 2019-04-08 NOTE — Progress Notes (Signed)
Pt was disimpacted last evening per his request. Small amount of brown soft round balls removed. Pt tolerated fair. Pt was given a suppository after disimpaction. Pt was asleep when checked thirty minutes later. Pt awoken for morning vitals states he has pressure and pain and would like the milk and molasses enema that we discussed the prior evening. Will order from pharmacy.  Pt in bed watching his phone call light within reach PD continues.

## 2019-04-09 ENCOUNTER — Inpatient Hospital Stay (HOSPITAL_COMMUNITY): Payer: Medicare (Managed Care) | Admitting: Occupational Therapy

## 2019-04-09 ENCOUNTER — Inpatient Hospital Stay (HOSPITAL_COMMUNITY): Payer: Medicare (Managed Care) | Admitting: Physical Therapy

## 2019-04-09 ENCOUNTER — Ambulatory Visit (HOSPITAL_COMMUNITY): Payer: Medicare (Managed Care) | Admitting: *Deleted

## 2019-04-09 ENCOUNTER — Inpatient Hospital Stay (HOSPITAL_COMMUNITY): Payer: Medicare (Managed Care)

## 2019-04-09 LAB — GLUCOSE, CAPILLARY
Glucose-Capillary: 100 mg/dL — ABNORMAL HIGH (ref 70–99)
Glucose-Capillary: 141 mg/dL — ABNORMAL HIGH (ref 70–99)
Glucose-Capillary: 275 mg/dL — ABNORMAL HIGH (ref 70–99)
Glucose-Capillary: 146 mg/dL — ABNORMAL HIGH (ref 70–99)

## 2019-04-09 LAB — RENAL FUNCTION PANEL
Albumin: 1.8 g/dL — ABNORMAL LOW (ref 3.5–5.0)
Anion gap: 22 — ABNORMAL HIGH (ref 5–15)
BUN: 107 mg/dL — ABNORMAL HIGH (ref 6–20)
CO2: 18 mmol/L — ABNORMAL LOW (ref 22–32)
Calcium: 8.8 mg/dL — ABNORMAL LOW (ref 8.9–10.3)
Chloride: 95 mmol/L — ABNORMAL LOW (ref 98–111)
Creatinine, Ser: 13 mg/dL — ABNORMAL HIGH (ref 0.61–1.24)
GFR calc Af Amer: 5 mL/min — ABNORMAL LOW (ref >60)
GFR calc non Af Amer: 4 mL/min — ABNORMAL LOW (ref >60)
Glucose, Bld: 111 mg/dL — ABNORMAL HIGH (ref 70–99)
Phosphorus: 5.9 mg/dL — ABNORMAL HIGH (ref 2.5–4.6)
Potassium: 4.4 mmol/L (ref 3.5–5.1)
Sodium: 135 mmol/L (ref 135–145)

## 2019-04-09 LAB — CBC WITH DIFFERENTIAL/PLATELET
Abs Immature Granulocytes: 0.07 10*3/uL (ref 0.00–0.07)
Basophils Absolute: 0.1 10*3/uL (ref 0.0–0.1)
Basophils Relative: 1 %
Eosinophils Absolute: 0.5 10*3/uL (ref 0.0–0.5)
Eosinophils Relative: 6 %
HCT: 24.7 % — ABNORMAL LOW (ref 39.0–52.0)
Hemoglobin: 7.5 g/dL — ABNORMAL LOW (ref 13.0–17.0)
Immature Granulocytes: 1 %
Lymphocytes Relative: 23 %
Lymphs Abs: 2 10*3/uL (ref 0.7–4.0)
MCH: 29.9 pg (ref 26.0–34.0)
MCHC: 30.4 g/dL (ref 30.0–36.0)
MCV: 98.4 fL (ref 80.0–100.0)
Monocytes Absolute: 0.8 10*3/uL (ref 0.1–1.0)
Monocytes Relative: 10 %
Neutro Abs: 5 10*3/uL (ref 1.7–7.7)
Neutrophils Relative %: 59 %
Platelets: 248 10*3/uL (ref 150–400)
RBC: 2.51 MIL/uL — ABNORMAL LOW (ref 4.22–5.81)
RDW: 16 % — ABNORMAL HIGH (ref 11.5–15.5)
WBC: 8.5 10*3/uL (ref 4.0–10.5)
nRBC: 0 % (ref 0.0–0.2)

## 2019-04-09 LAB — OCCULT BLOOD X 1 CARD TO LAB, STOOL: Fecal Occult Bld: POSITIVE — AB

## 2019-04-09 MED ORDER — SODIUM BICARBONATE 650 MG PO TABS
650.0000 mg | ORAL_TABLET | Freq: Two times a day (BID) | ORAL | Status: AC
  Administered 2019-04-09 – 2019-04-11 (×4): 650 mg via ORAL
  Filled 2019-04-09 (×4): qty 1

## 2019-04-09 MED ORDER — SENNA 8.6 MG PO TABS
1.0000 | ORAL_TABLET | Freq: Two times a day (BID) | ORAL | Status: DC
  Administered 2019-04-09 – 2019-04-17 (×7): 8.6 mg via ORAL
  Filled 2019-04-09 (×27): qty 1

## 2019-04-09 NOTE — Progress Notes (Signed)
Recreational Therapy Session Note  Patient Details  Name: Jorge [name] MRN: [id number] Date of Birth: [birth year] Today's Date: 04/09/2019 Time:  905-10 Pain: no c/o Skilled Therapeutic Interventions/Progress Updates: Session focused on activity tolerance, slide board transfers, w/c mobility throughout hospital simulating community mobility.  Pt with c/o of fatigue from lack of sleep last night, states continued problem with bowels.  Pt performed slide board transfer to [facility] with min assist due to fatigue.  Pt required verbal cues and physical assistance for peri care, and pulling up brief and shorts during lateral leans.  Pt requesting to practice clothing management with toileting tasks in future OT sessions.  Will alert OT of request.  Pt propelled w/c throughout the hospital accessing public elevators, negotiating obstacles and problem solving through public restroom access with supervision, min cues.  Pt appreciative of information and off the unit activity.  Pt returned to his room and performed slide board transfer back to bed with contact guard assist.  Therapy/Group: Co-Treatment  [place] 04/09/2019, 12:14 PM

## 2019-04-09 NOTE — Progress Notes (Signed)
Pt had 5+ bowel movements, ranging from type 5 to type 7. Pt states he has minor incontinence due to the diarrhea.  Pt resting in bed at this time watching tv, unable to sleep due to the continuous bowel movements.

## 2019-04-09 NOTE — Progress Notes (Signed)
Physical Therapy Session Note  Patient Details  Name: Jorge [name] MRN: [id number] Date of Birth: Jun 07, 1970  Today's Date: 04/09/2019 PT Individual Time: 1510-1536 PT Individual Time Calculation (min): 26 min   Short Term Goals: Week 2:  PT Short Term Goal 1 (Week 2): STG = LTG due to short ELOS.  Skilled Therapeutic Interventions/Progress Updates:    Patient received in bed, frustrated at a situation with visitors and stated "get me out of this room!". Able to perform bed mobility with S, also SBT to and from [facility] with min guard for safety with good sequencing and placement of board noted. Focused on activity tolerance and able to self-propel WC distances of 200+ft with BUEs on an independent basis although fatigued, did require one rest break in WC. Patient appreciative of chance to get out of room and "just blow off some steam". Transferred back to bed with min guard and sliding board, left in bed with all needs met, bed alarm active.   Therapy Documentation Precautions:  Precautions Precautions: Fall Precaution Comments: wound VACs bilateral residual limbs, limb protectors Restrictions Weight Bearing Restrictions: No RLE Weight Bearing: Non weight bearing LLE Weight Bearing: Non weight bearing Pain: Pain Assessment Pain Scale: 0-10 Pain Score: 0-No pain    Therapy/Group: Individual Therapy   Windell Norfolk, DPT, PN1   Supplemental Physical Therapist [place]    Pager [phone] Acute Rehab Office [phone]    04/09/2019, 4:09 PM

## 2019-04-09 NOTE — Progress Notes (Signed)
Physical Therapy Session Note  Patient Details  Name: Jorge [name] MRN: [id number] Date of Birth: Nov 28, 1970  Today's Date: 04/09/2019 PT Individual Time: 9532-0233 PT Individual Time Calculation (min): 28 min   Short Term Goals: Week 1:  PT Short Term Goal 1 (Week 1): STG = LTG due to short ELOS. Week 2:  PT Short Term Goal 1 (Week 2): STG = LTG due to short ELOS.  Skilled Therapeutic Interventions/Progress Updates:   Received pt supine in bed, pt agreeable to therapy, and denied any pain during session. Pt requested to remain in room for therapy today to wait for family. Session focused on functional mobility/transfers, UE/LE strength, amputee education, WC parts management, dynamic sitting balance, and improved activity tolerance. Pt transferred supine<>sitting EOB with supervision and transferred bed<>WC via slideboard CGA. While sitting in WC, pt was educated on [facility] parts management including donning/doffing leg rests and arm rests. Pt required increased time and cueing to don leg rests due to complexity of these specific leg rests. Therapist inspected pt's R posterior residual limb per pt request. Noted dry skin/minor irritation from shrinker placement, NT aware. Session interrupted due to confusion regarding visitation policy and which family members were allowed in room. Therapist assisted with donning R residual limb shrinker with mod A for time management purposes. Pt transferred WC<>bed via slideboard CGA and sit<>supine with supervision. Concluded session with pt supine in bed, needs within reach, and bed alarm on.   Therapy Documentation Precautions:  Precautions Precautions: Fall Precaution Comments: wound VACs bilateral residual limbs, limb protectors Restrictions Weight Bearing Restrictions: No RLE Weight Bearing: Non weight bearing LLE Weight Bearing: Non weight bearing  Therapy/Group: Individual Therapy Alfonse Alpers PT, DPT   04/09/2019, 7:43 AM

## 2019-04-09 NOTE — Progress Notes (Signed)
Social Work Patient ID: Jorge [name], male   DOB: 06/13/1970, 49 y.o.   MRN: [id number]   SW spoke with admission at request to check pt insurance change. States  Insurance is still listed as [employer] [place].   SW met with pt in room and informed on no changes to his insurance. Pt states he will follow-up with Medicare to discuss further.   Loralee Pacas, MSW, [place] Office: [phone] Cell: [phone] Fax: [phone]

## 2019-04-09 NOTE — Progress Notes (Signed)
Physical Therapy Weekly Progress Note  Patient Details  Name: Jorge [name] MRN: [id number] Date of Birth: [birth year]  Beginning of progress report period: April 01, 2019 End of progress report period: April 09, 2019  Today's Date: 04/09/2019 PT Individual Time: 0904-1005 PT Individual Time Calculation (min): 61 min   Patient is progressing well towards LTGs term goals, with some limited progress due to fatigue and frequent BMs. He currently requires supervision for bed mobility, CGA-supervision for slide board transfers and min-mod A for A/P transfers, mod I for w/c mobility, min A for donning shrinkers, mod I for donning limb guards, min cues for HEP, and min A-supervision for w/c parts management.  Patient continues to demonstrate the following deficits muscle weakness and muscle joint tightness, decreased cardiorespiratoy endurance and decreased sitting balance, decreased postural control, decreased balance strategies and difficulty maintaining precautions and therefore will continue to benefit from skilled PT intervention to increase functional independence with mobility.  Patient progressing toward long term goals..  Continue plan of care.  PT Short Term Goals Week 1:  PT Short Term Goal 1 (Week 1): STG = LTG due to short ELOS. Week 2:  PT Short Term Goal 1 (Week 2): STG = LTG due to short ELOS.  Skilled Therapeutic Interventions/Progress Updates:     Patient in bed upon PT arrival. Patient alert and agreeable to PT session. Patient denied pain during session. Lattie Haw, RT present for co-treat throughout session.  Session focused on activity tolerance, slide board transfers, w/c mobility throughout hospital simulating community mobility.  Pt with c/o of fatigue from lack of sleep last night, states continued problem with bowels.  Pt performed slide board transfer to [facility] with min assist due to fatigue.  Pt required verbal cues and physical assistance for peri care, and pulling up brief and  shorts during lateral leans.  Pt requesting to practice clothing management with toileting tasks in future OT sessions.  Will alert OT of request.  Pt propelled w/c throughout the hospital accessing public elevators, negotiating obstacles and problem solving through public restroom access with supervision, min cues.  Pt appreciative of information and off the unit activity.  Pt returned to his room and performed slide board transfer back to bed with contact guard assist and supervision for board placement.  Patient in bed at end of session with breaks locked, bed alarm set, and all needs within reach.    Therapy Documentation Precautions:  Precautions Precautions: Fall Precaution Comments: wound VACs bilateral residual limbs, limb protectors Restrictions Weight Bearing Restrictions: No RLE Weight Bearing: Non weight bearing LLE Weight Bearing: Non weight bearing   Therapy/Group: Individual Therapy  Merrilyn Legler L Mickle Campton PT, DPT  04/09/2019, 4:46 PM

## 2019-04-09 NOTE — Progress Notes (Signed)
[place] PHYSICAL MEDICINE & REHABILITATION PROGRESS NOTE   Subjective/Complaints:  Pt reports unhappy was up having BMs until 2am, however is now cleaned out and doesn't have nausea or discomfort due to constipation anymore.   Feeling much better.  Filled bedpan 2x- is sleepy since didn't sleep well overnight.  Didn't need enema.   ROS:   Pt denies SOB, abd pain, CP, N/V/C/D, and vision changes  Objective:   No results found. Recent Labs    04/07/19 0517 04/09/19 0541  WBC 8.9 8.5  HGB 7.8* 7.5*  HCT 24.9* 24.7*  PLT 221 248   Recent Labs    04/07/19 0517 04/09/19 0541  NA 131* 135  K 5.3* 4.4  CL 92* 95*  CO2 21* 18*  GLUCOSE 190* 111*  BUN 111* 107*  CREATININE 13.48* 13.00*  CALCIUM 8.8* 8.8*    Intake/Output Summary (Last 24 hours) at 04/09/2019 0904 Last data filed at 04/09/2019 0300 Gross per 24 hour  Intake 550 ml  Output 6 ml  Net 544 ml     Physical Exam: Vital Signs Blood pressure 126/78, pulse 71, temperature (!) 97.5 F (36.4 C), temperature source Oral, resp. rate 18, height 4\' 8"  (1.422 m), weight 67.8 kg, SpO2 100 %.  Gen: sitting up in bed, appropriate, NAD CV: RRR; no M/R/G Pulm:  Pt denies SOB, abd pain, CP, N/V/C/D, and vision changes GI- Soft, NT, ND, (+)BS  Skin: Bilateral BKA's - L BKA puffy skin not completely attached (pulled by shrinker)  At midline- surrounded by dried blood- nothing draining actively- no erythema- nothing actually open. - not assessed today Psych: appropriate, smiling Musc: B/L BKAs- limb protectors not on Neuro: Ox3 Motor:  Bilateral lower extremities: Hip flexion, knee extension 5/5, unchanged  Assessment/Plan: 1. Functional deficits secondary to B/L BKAs due to gangrene/osteomyelitis which require 3+ hours per day of interdisciplinary therapy in a comprehensive inpatient rehab setting.  Physiatrist is providing close team supervision and 24 hour management of active medical problems listed  below.  Physiatrist and rehab team continue to assess barriers to discharge/monitor patient progress toward functional and medical goals  Care Tool:  Bathing    Body parts bathed by patient: Right arm, Left arm, Chest, Abdomen, Face, Front perineal area, Right upper leg, Left upper leg   Body parts bathed by helper: Buttocks Body parts n/a: Right lower leg, Left lower leg   Bathing assist Assist Level: Minimal Assistance - Patient > 75%     Upper Body Dressing/Undressing Upper body dressing   What is the patient wearing?: Pull over shirt    Upper body assist Assist Level: Contact Guard/Touching assist    Lower Body Dressing/Undressing Lower body dressing      What is the patient wearing?: Underwear/pull up, Pants     Lower body assist Assist for lower body dressing: Moderate Assistance - Patient 50 - 74%     Toileting Toileting    Toileting assist Assist for toileting: Minimal Assistance - Patient > 75%     Transfers Chair/bed transfer  Transfers assist     Chair/bed transfer assist level: Supervision/Verbal cueing     Locomotion Ambulation   Ambulation assist   Ambulation activity did not occur: Safety/medical concerns          Walk 10 feet activity   Assist  Walk 10 feet activity did not occur: Safety/medical concerns        Walk 50 feet activity   Assist Walk 50 feet with 2 turns activity  did not occur: Safety/medical concerns         Walk 150 feet activity   Assist Walk 150 feet activity did not occur: Safety/medical concerns         Walk 10 feet on uneven surface  activity   Assist Walk 10 feet on uneven surfaces activity did not occur: Safety/medical concerns         Wheelchair     Assist Will patient use wheelchair at discharge?: Yes Type of Wheelchair: Manual    Wheelchair assist level: Independent Max wheelchair distance: 150'    Wheelchair 50 feet with 2 turns activity    Assist         Assist Level: Independent   Wheelchair 150 feet activity     Assist      Assist Level: Independent   Blood pressure 126/78, pulse 71, temperature (!) 97.5 F (36.4 C), temperature source Oral, resp. rate 18, height 4\' 8"  (1.422 m), weight 67.8 kg, SpO2 100 %.  Medical Problem List and Plan: 1.Impaired function/ADLs/mobilitysecondary to B/L BKAs due to osteomyelitis and gangrene- s/p IV ABX- completed 3/18  Continue CIR  2. Antithrombotics: -DVT/anticoagulation:Pharmaceutical:Heparindue to PD/ESRD -antiplatelet therapy:  3. Pain Management:Hydrocodone as needed- per pt, pain meds are suitable/working  4/1- is weaning self on pain meds 4. Mood: LCSW to follow for evaluation and support -antipsychotic agents:  5. Neuropsych: This patientiscapable of making decisions onhis own behalf. 6. Skin/Wound Care:   Will need to monitor incisions closely given recent fall  4/1- put sign to remind pt to not stand 7. Fluids/Electrolytes/Nutrition:Strict I's and O's. Monitor weights daily after PD. 8.  ESRD: Continue CCPD daily.Is oliguric, not anuric- voids ~2x/day 9.Acute on chronic anemia: On ESA weekly.   Hemoglobin 7.4 on 3/26, continue to monitor  3/31- Last Hb 7/8- will recheck tomorrow  4/1- Hb 7.5- stable 10.Nutrition: Severe protein malnutrition-Renal/carb modified diet to continue with prostat due to low calorie/proteinmalnutrition. 11. HAL:PFXTKWI blood pressures twice daily. Off hydralazine and furosemide at this time.   Con't clonidine and Norvasc   Controlled on 3/28 12.T2DM with hyperglycemia: A1c 7.8-Continue Lantus daily--d/c meal coverage due to hypoglycemic episodes.Monitor blood sugars AC at bedtime for now due to hypoglycemia.   CBG (last 3)  Recent Labs    04/08/19 1652 04/08/19 2106 04/09/19 0611  GLUCAP 114* 156* 100*   Lantus decreased to 15 units on 3/27 (already received a.m. dose)  Labile with  hypoglycemia on 3/28.  Will need to monitor closely overnight.  Bedtime snack ordered for tonight.  3/29- BGs [phone]- will call DM coordinator to help.   3/30- DM coord asked me to increase Lantus to 18 units- will order.   4/1- BGs 100-156- doing OK 13. Phantom itching- might benefit from nerve pain meds- low dose.  Consider medications if necessary 14. Leukocytosis: Resolved  3/30- WBC down to 8.9 from 13 yesterday 15. Hyperkalemia  3/30- up to 5.3- will see if renal will address since renal pt- if not, will try Lokelma.  3/31- Renal ordered Lokelma x1- wil recheck labs in AM  4/1- K+ down to 4.4- con't regimen 16. Constipation  3/31- will order Sorbitol 60 ml then follow in 3-4 hours with enema.   4/1- cleaned out- reminded pt to NOT refuse bowel meds   LOS: 9 days A FACE TO FACE EVALUATION WAS PERFORMED  Teasia Zapf 04/09/2019, 9:04 AM

## 2019-04-09 NOTE — Progress Notes (Signed)
[facility] Progress Note   Dialysis Orders: [facility] CCPD 7 Days weekly 80 kg 4 exchanges 1.6 mls Dwell time 1.5 hr, No pause or day dwells. Uses mostly 2.5% solution  BMD meds: -Renvela 800 mg 3 tabs PO TID with meals -Calcitriol 0.5 mcg PO TIW (MWF) -   Assessment/Plan: 1. 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 In CIR.  2. DM/Hyperglycemia: Per primary  3. ESRD - Azotemic, suspected from protein intake and marginal PD Rx. Constipation may have contributed as well.  Increased vol to 2.3L on 3/27, kept 4 exchanges.  Later increased dwell time to 2 hrs. Might need a last bag fill with midday drain as an outpatient - does not appear to be an inpatient option per dialysis unit.   1. Continue PD - 1.5% tonight  2. Appreciate optimizing bowels as constipation earlier may have contributed to poor clearance.  3. Renal panel AM.  Clearance a little better     4. Hypertension/volume -BP well controlled.Noton antihypertensive meds as OP per charting. Amlodipine startedhere on3/13 Significantly under edw, down to 67.7kgon 4/1. Does not appear volume overloaded.  Will need new edw at d/c. 5. Anemia - HGB initially 7.7 fell to 5.4 on3/12 and transfused two units PRBCs.  Update CBC.Heme neg 3/11 Ferritin still increased post surgery to 1420 with drop in tsat to 15% - s/p  a short course of IV Fe (250 x 2) on Aranesp 200/ week - given qThurs. Transfuse as indicated. CTM.  Note FOBT was checked and positive - per primary team. Pt states has hemorrhoids 6. Metabolic bone disease -phos improving on current regimen 7. Nutrition - Albumin low prostat, renal vits.-Renal/carb modified diet.     Subjective:    Feels ok today.  Clearance a little better and he's glad to hear.  Had three BM's earlier today.  FOBT positive and states has hemorrhoids.  Stool was brown.  No blood visible to him or dark stools.  No UF is available for  today yet UF charted as 2151 mL for 3/31 AM  UF is charted as 2838 mL for 3/30 AM with all 2.5%.  Review of systems:  Denies shortness of breath  Denies chest pain  Denies n/v    Objective Vitals:   04/08/19 2010 04/09/19 0545 04/09/19 0845 04/09/19 1417  BP: 139/70 126/78  130/69  Pulse: 81 71  76  Resp: 18 18    Temp: 97.8 F (36.6 C) (!) 97.5 F (36.4 C)  98.1 F (36.7 C)  TempSrc:  Oral  Oral  SpO2: 100% 100%    Weight:   67.7 kg   Height:       Physical Exam General:slender male NAD on room air in NAD  HEENT NCAT  Heart: S1S2; no rub Lungs: clear and unlabored  Abdomen: soft nontender nondistended  Extremities:bilateral BKA ; no pitting edema residual limbs Dialysis Access:  PD cath dressing dry and intact   Additional Objective Labs: Basic Metabolic Panel: Recent Labs  Lab 04/06/19 0924 04/07/19 0517 04/09/19 0541  NA 129* 131* 135  K 5.1 5.3* 4.4  CL 87* 92* 95*  CO2 22 21* 18*  GLUCOSE 189* 190* 111*  BUN 116* 111* 107*  CREATININE 13.35* 13.48* 13.00*  CALCIUM 8.8* 8.8* 8.8*  PHOS 5.8* 5.7* 5.9*   Liver Function Tests: Recent Labs  Lab 04/06/19 0924 04/07/19 0517 04/09/19 0541  ALBUMIN 1.8* 1.7* 1.8*   CBC: Recent Labs  Lab  04/03/19 0650 04/03/19 0650 04/06/19 0924 04/06/19 0924 04/06/19 1344 04/07/19 0517 04/09/19 0541  WBC 9.4   < > 11.2*   < > 13.1* 8.9 8.5  NEUTROABS 6.1  --   --   --   --  5.5 5.0  HGB 7.4*   < > 5.9*   < > 7.0* 7.8* 7.5*  HCT 23.5*   < > 17.9*   < > 22.6* 24.9* 24.7*  MCV 95.9  --  91.8  --  97.4 96.5 98.4  PLT 216   < > 259   < > 223 221 248   < > = values in this interval not displayed.   CBG: Recent Labs  Lab 04/08/19 1157 04/08/19 1652 04/08/19 2106 04/09/19 0611 04/09/19 1219  GLUCAP 132* 114* 156* 100* 141*   Studies/Results: No results found. Medications: . dialysis solution 1.5% low-MG/low-CA    . dialysis solution 2.5% low-MG/low-CA     . amLODipine  10 mg Oral Daily  . aspirin EC   81 mg Oral Daily  . atorvastatin  20 mg Oral q1800  . calcitRIOL  0.5 mcg Oral Once per day on Mon Wed Fri  . cloNIDine  0.1 mg Oral QHS  . darbepoetin (ARANESP) injection - NON-DIALYSIS  200 mcg Subcutaneous Q Thu-1800  . docusate sodium  100 mg Oral BID  . feeding supplement (PRO-STAT SUGAR FREE 64)  30 mL Oral TID BM  . gabapentin  100 mg Oral QHS  . gentamicin cream  1 application Topical Daily  . heparin  5,000 Units Subcutaneous Q8H  . insulin aspart  0-5 Units Subcutaneous QHS  . insulin aspart  0-6 Units Subcutaneous TID WC  . insulin glargine  18 Units Subcutaneous Daily  . multivitamin  1 tablet Oral QHS  . senna  1 tablet Oral BID  . sevelamer carbonate  3,200 mg Oral TID WC       Claudia Desanctis 04/09/2019 3:01 PM

## 2019-04-09 NOTE — Progress Notes (Signed)
Occupational Therapy Weekly Progress Note  Patient Details  Name: Jorge [name] MRN: [id number] Date of Birth: 1970/01/16  Beginning of progress report period: April 01, 2019 End of progress report period: April 09, 2019  Today's Date: 04/09/2019 OT Individual Time: 1100-1208 OT Individual Time Calculation (min): 68 min    Patient has met 2 of 3 short term goals.  Patient making progress with functional transfers and adl tasks.  Patient continues to demonstrate the following deficits: muscle weakness, decreased cardiorespiratoy endurance and decreased sitting balance and decreased postural control and therefore will continue to benefit from skilled OT intervention to enhance overall performance with BADL, iADL and Reduce care partner burden.  Patient progressing toward long term goals..  Continue plan of care.  OT Short Term Goals Week 1:  OT Short Term Goal 1 (Week 1): Pt will transfer wiht CGA to BSC/toilet OT Short Term Goal 1 - Progress (Week 1): Met OT Short Term Goal 2 (Week 1): Pt will don pants wiht CGA using lateral leans OT Short Term Goal 2 - Progress (Week 1): Met OT Short Term Goal 3 (Week 1): Pt will manage pants prior to toileting wiht CGA OT Short Term Goal 3 - Progress (Week 1): Met Week 2:  OT Short Term Goal 1 (Week 2): Patient will complete SB transfers to/from drop arm commode, bed and w/c with CS OT Short Term Goal 2 (Week 2): patient will complete toileting at drop arm commode with CS/set up  Skilled Therapeutic Interventions/Progress Updates:    Patient in bed, alert - states that he is feeling tired today but willing to participate, no pain.  Supine to sit with CS, SB transfer bed to w/c with CGA.  Completed UB bathing and grooming tasks w/c level with set up.  W/c propulsion on unit with CS for 60 feet.  Reviewed parts of new w/c and set up of amp/limb extensions, arm rest operation.  SB transfer w/c to drop arm commode with CGA.  He is able to pull pants down  seated on commode and change soiled brief with set up/CS but requires max A today to complete hygiene with lateral leans due to fatigue and loose BM, mod A to pull brief and pants back over hips for SB transfer back to bed surface CGA.  He remained in bed at close of session, bed alarm set and call bell in reach.    Therapy Documentation Precautions:  Precautions Precautions: Fall Precaution Comments: wound VACs bilateral residual limbs, limb protectors Restrictions Weight Bearing Restrictions: No RLE Weight Bearing: Non weight bearing LLE Weight Bearing: Non weight bearing General:   Vital Signs: Therapy Vitals Temp: (!) 97.5 F (36.4 C) Temp Source: Oral Pulse Rate: 71 Resp: 18 BP: 126/78 Patient Position (if appropriate): Lying Oxygen Therapy SpO2: 100 % O2 Device: Room Air  Therapy/Group: Individual Therapy  Carlos Levering 04/09/2019, 7:39 AM

## 2019-04-10 ENCOUNTER — Inpatient Hospital Stay (HOSPITAL_COMMUNITY): Payer: Medicare (Managed Care)

## 2019-04-10 ENCOUNTER — Ambulatory Visit (HOSPITAL_COMMUNITY): Payer: Medicare (Managed Care) | Admitting: *Deleted

## 2019-04-10 ENCOUNTER — Inpatient Hospital Stay (HOSPITAL_COMMUNITY): Payer: Medicare (Managed Care) | Admitting: Physical Therapy

## 2019-04-10 ENCOUNTER — Encounter (HOSPITAL_COMMUNITY): Payer: Medicare (Managed Care) | Admitting: Psychology

## 2019-04-10 LAB — RENAL FUNCTION PANEL
Albumin: 1.8 g/dL — ABNORMAL LOW (ref 3.5–5.0)
Anion gap: 17 — ABNORMAL HIGH (ref 5–15)
BUN: 101 mg/dL — ABNORMAL HIGH (ref 6–20)
CO2: 23 mmol/L (ref 22–32)
Calcium: 8.6 mg/dL — ABNORMAL LOW (ref 8.9–10.3)
Chloride: 93 mmol/L — ABNORMAL LOW (ref 98–111)
Creatinine, Ser: 12.79 mg/dL — ABNORMAL HIGH (ref 0.61–1.24)
GFR calc Af Amer: 5 mL/min — ABNORMAL LOW (ref >60)
GFR calc non Af Amer: 4 mL/min — ABNORMAL LOW (ref >60)
Glucose, Bld: 189 mg/dL — ABNORMAL HIGH (ref 70–99)
Phosphorus: 5.9 mg/dL — ABNORMAL HIGH (ref 2.5–4.6)
Potassium: 4.6 mmol/L (ref 3.5–5.1)
Sodium: 133 mmol/L — ABNORMAL LOW (ref 135–145)

## 2019-04-10 LAB — GLUCOSE, CAPILLARY
Glucose-Capillary: 190 mg/dL — ABNORMAL HIGH (ref 70–99)
Glucose-Capillary: 174 mg/dL — ABNORMAL HIGH (ref 70–99)
Glucose-Capillary: 77 mg/dL (ref 70–99)
Glucose-Capillary: 301 mg/dL — ABNORMAL HIGH (ref 70–99)

## 2019-04-10 NOTE — Progress Notes (Signed)
Occupational Therapy Session Note  Patient Details  Name: Jorge [name] MRN: [id number] Date of Birth: 16-May-1970  Today's Date: 04/10/2019 OT Individual Time: 1100-1159 OT Individual Time Calculation (min): 59 min    Short Term Goals: Week 1:  OT Short Term Goal 1 (Week 1): Pt will transfer wiht CGA to BSC/toilet OT Short Term Goal 1 - Progress (Week 1): Met OT Short Term Goal 2 (Week 1): Pt will don pants wiht CGA using lateral leans OT Short Term Goal 2 - Progress (Week 1): Met OT Short Term Goal 3 (Week 1): Pt will manage pants prior to toileting wiht CGA OT Short Term Goal 3 - Progress (Week 1): Met  Skilled Therapeutic Interventions/Progress Updates:    1:1. Pt received in bed agreeable to OT ready "to get to work." no pain reported. Pt completes slide baord transfer with set up of w/c and provided SB to pt. Pt places and scoots with no A. Pt completes oral care seated in w/c. Pt completes w/c mobility to.from all tx destinations with MOD I. Pt completes 5x30 sec on 30 sec off seated shuttle activity for BUE strengthening/endurance with 3# wrist weights seated in w/c. Pt provided 2 handouts BUE stretching and limb desensitization. Pt completed all activities in handouts with VC for technique. Exited session with pt seated in bed, exit alarm on and call light in reach  Therapy Documentation Precautions:  Precautions Precautions: Fall Precaution Comments: wound VACs bilateral residual limbs, limb protectors Restrictions Weight Bearing Restrictions: No RLE Weight Bearing: Non weight bearing LLE Weight Bearing: Non weight bearing General:   Vital Signs:   Pain: Pain Assessment Pain Scale: 0-10 Pain Score: 0-No pain Faces Pain Scale: No hurt ADL: ADL Grooming: Setup Where Assessed-Grooming: Sitting at sink Upper Body Bathing: Contact guard Where Assessed-Upper Body Bathing: Edge of bed Lower Body Bathing: Minimal assistance Where Assessed-Lower Body Bathing: Bed  level Upper Body Dressing: Contact guard Where Assessed-Upper Body Dressing: Edge of bed Lower Body Dressing: Moderate assistance Where Assessed-Lower Body Dressing: Edge of bed Vision   Perception    Praxis   Exercises:   Other Treatments:     Therapy/Group: Individual Therapy  Tonny Branch 04/10/2019, 11:10 AM

## 2019-04-10 NOTE — Progress Notes (Signed)
Physical Therapy Session Note  Patient Details  Name: Jorge [name] MRN: [id number] Date of Birth: 07/25/1970  Today's Date: 04/10/2019 PT Individual Time: 1305-1415 PT Individual Time Calculation (min): 70 min   Short Term Goals: Week 2:  PT Short Term Goal 1 (Week 2): STG = LTG due to short ELOS.  Skilled Therapeutic Interventions/Progress Updates:    Patient received asleep in bed, easily woken but surprised to hear that he had PT at 1pm- states, "I thought I didn't have you until 3!" but willing to participate with encouragement. Able to propel WC with independence with BUEs. Continues to require min guard for multipleWC<->bed/mat transfers today, did much better with placement of board for transfers this afternoon and only needed occasional cues for sequencing with all transfers. Even able to transfer easily with B limb guards donned during transfer today! Continued focus on functional strengthening, with special focus on triceps via supersets of skull crushers with BUEs on 4# weight bar/SLRs and U UE skull crushers with 3# weight and supine hip abduction. At this point he began to have stomach cramps and urgently needed to return to his room- able to transfer with min guard and limb protectors applied and transported to his room totalA due to urgency, however once we got back to his room urge had subsided. Transferred back to bed and finished session working on BLE exercises appropriate for B BKA in bed. Left in bed with all needs met, bed alarm active all needs met this afternoon.   Therapy Documentation Precautions:  Precautions Precautions: Fall Precaution Comments: wound VACs bilateral residual limbs, limb protectors Restrictions Weight Bearing Restrictions: No RLE Weight Bearing: Non weight bearing LLE Weight Bearing: Non weight bearing Pain: Pain Assessment Pain Scale: 0-10 Pain Score: 0-No pain Faces Pain Scale: No hurt Mobility:     Therapy/Group: Individual Therapy     U PT, DPT, PN1   Supplemental Physical Therapist [place]    Pager [phone] Acute Rehab Office [phone]    04/10/2019, 3:32 PM  

## 2019-04-10 NOTE — Progress Notes (Signed)
Social Work Patient ID: Jorge [name], male   DOB: September 16, 1970, 49 y.o.   MRN: [id number]    SW followed up with pt to inquire about insurance. Pt reports he called Medicare again yesterday and was told that it his insurance does show the change (Medicare A/B) and it takes up to 10 days to take effect.   SW met with pt again in room to provide SNF list. SW reiterated that he would require HD if he goes to SNF. Pt would prefer to not have HD. Pt encouraged to speak with family about coming in for family education to see if they can manage his care needs at home. Pt states he intends to speak with his dtr about coming in on Tuesday. Pt encouraged to speak with all family about his discharge.   Loralee Pacas, MSW, [place] Office: [phone] Cell: [phone] Fax: [phone]

## 2019-04-10 NOTE — Progress Notes (Signed)
Physical Therapy Session Note  Patient Details  Name: Jorge [name] MRN: [id number] Date of Birth: 03/14/1970  Today's Date: 04/10/2019 PT Individual Time: 0902-1000 PT Individual Time Calculation (min): 58 min   Short Term Goals: Week 2:  PT Short Term Goal 1 (Week 2): STG = LTG due to short ELOS.  Skilled Therapeutic Interventions/Progress Updates:    Patient received in bed, motivated and ready to work this morning! Able to complete bed mobility with general S with use of bed rails, did need Mod cues for safety when performing SBT from bed to chair as he was distracted by talking to social worker and missed raising arm rest and that sliding board was sliding anteriorly out from under him- after cues and resetting transfer, able to perform safety with min guard and cues for board placement. Self-propelled WC to PT gym with BUEs and independence, then able to perform SBT to mat table with min guard. Focused session on dynamic sitting balance with tangential ball tosses to midline and either side, also performed lateral trunk crunches down to elbow with ball toss in between each rep and also worked on tricep presses on wood blocks (2x10 with only mild level of clearing of buttocks from mat table) and AP scooting on table but extent of AP scoots were limited by UE fatigue. Required Mod cues for safety and to make sure all pieces of transfer were set up (forgot to lock chair this time) with SBT back to mat table due to dual tasking with another conversation with social worker but physically able to perform transfer with min guard. Left up in [facility] with seatbelt alarm active and social worker present, all other needs met this morning.   Therapy Documentation Precautions:  Precautions Precautions: Fall Precaution Comments: wound VACs bilateral residual limbs, limb protectors Restrictions Weight Bearing Restrictions: No RLE Weight Bearing: Non weight bearing LLE Weight Bearing: Non weight  bearing Pain: Pain Assessment Pain Scale: 0-10 Pain Score: 0-No pain Faces Pain Scale: No hurt    Therapy/Group: Individual Therapy   Windell Norfolk, DPT, PN1   Supplemental Physical Therapist [place]    Pager [phone] Acute Rehab Office [phone]    04/10/2019, 10:47 AM
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
## 2019-04-10 NOTE — Progress Notes (Signed)
[facility] PHYSICAL MEDICINE & REHABILITATION PROGRESS NOTE   Subjective/Complaints:  Pt reports has a knot on L lower abdomen he's concerned doesn't know what it's from.   Sister and son are his visitors.  .   ROS:   Pt denies SOB, abd pain, CP, N/V/C/D, and vision changes   Objective:   No results found. Recent Labs    04/09/19 0541  WBC 8.5  HGB 7.5*  HCT 24.7*  PLT 248   Recent Labs    04/09/19 0541 04/10/19 0554  NA 135 133*  K 4.4 4.6  CL 95* 93*  CO2 18* 23  GLUCOSE 111* 189*  BUN 107* 101*  CREATININE 13.00* 12.79*  CALCIUM 8.8* 8.6*    Intake/Output Summary (Last 24 hours) at 04/10/2019 1027 Last data filed at 04/10/2019 1024 Gross per 24 hour  Intake 480 ml  Output 700 ml  Net -220 ml     Physical Exam: Vital Signs Blood pressure 131/83, pulse 69, temperature 98.2 F (36.8 C), temperature source Oral, resp. rate 16, height 4\' 8"  (1.422 m), weight 68.3 kg, SpO2 100 %.  Gen: sitting up in bed; not attached to PD, NAD CV: RRR Pulm: CTA B/L GI- Soft, NT, ND, (+)BS; what feels like small hematoma from SQ Heparin in LLQ  Skin: Bilateral BKA's -staples still intact - not assessed today Psych: appropriate, smiling Musc: B/L BKAs- limb protectors not on Neuro: Ox3 Motor:  Bilateral lower extremities: Hip flexion, knee extension 5/5, unchanged  Assessment/Plan: 1. Functional deficits secondary to B/L BKAs due to gangrene/osteomyelitis which require 3+ hours per day of interdisciplinary therapy in a comprehensive inpatient rehab setting.  Physiatrist is providing close team supervision and 24 hour management of active medical problems listed below.  Physiatrist and rehab team continue to assess barriers to discharge/monitor patient progress toward functional and medical goals  Care Tool:  Bathing    Body parts bathed by patient: Right arm, Left arm, Chest, Abdomen, Face, Front perineal area, Right upper leg, Left upper leg   Body parts bathed by  helper: Buttocks Body parts n/a: Right lower leg, Left lower leg   Bathing assist Assist Level: Minimal Assistance - Patient > 75%     Upper Body Dressing/Undressing Upper body dressing   What is the patient wearing?: Pull over shirt    Upper body assist Assist Level: Contact Guard/Touching assist    Lower Body Dressing/Undressing Lower body dressing      What is the patient wearing?: Underwear/pull up, Pants     Lower body assist Assist for lower body dressing: Moderate Assistance - Patient 50 - 74%     Toileting Toileting    Toileting assist Assist for toileting: Minimal Assistance - Patient > 75%     Transfers Chair/bed transfer  Transfers assist     Chair/bed transfer assist level: Contact Guard/Touching assist(slideboard)     Locomotion Ambulation   Ambulation assist   Ambulation activity did not occur: Safety/medical concerns          Walk 10 feet activity   Assist  Walk 10 feet activity did not occur: Safety/medical concerns        Walk 50 feet activity   Assist Walk 50 feet with 2 turns activity did not occur: Safety/medical concerns         Walk 150 feet activity   Assist Walk 150 feet activity did not occur: Safety/medical concerns         Walk 10 feet on uneven surface  activity   Assist Walk 10 feet on uneven surfaces activity did not occur: Safety/medical concerns         Wheelchair     Assist Will patient use wheelchair at discharge?: Yes Type of Wheelchair: Manual    Wheelchair assist level: Independent Max wheelchair distance: 417ft+    Wheelchair 50 feet with 2 turns activity    Assist        Assist Level: Independent   Wheelchair 150 feet activity     Assist      Assist Level: Independent   Blood pressure 131/83, pulse 69, temperature 98.2 F (36.8 C), temperature source Oral, resp. rate 16, height 4\' 8"  (1.422 m), weight 68.3 kg, SpO2 100 %.  Medical Problem List and  Plan: 1.Impaired function/ADLs/mobilitysecondary to B/L BKAs due to osteomyelitis and gangrene- s/p IV ABX- completed 3/18  Continue CIR  2. Antithrombotics: -DVT/anticoagulation:Pharmaceutical:Heparindue to PD/ESRD -antiplatelet therapy:  3. Pain Management:Hydrocodone as needed- per pt, pain meds are suitable/working  4/1- is weaning self on pain meds 4. Mood: LCSW to follow for evaluation and support -antipsychotic agents:  5. Neuropsych: This patientiscapable of making decisions onhis own behalf. 6. Skin/Wound Care:   Will need to monitor incisions closely given recent fall  4/1- put sign to remind pt to not stand 7. Fluids/Electrolytes/Nutrition:Strict I's and O's. Monitor weights daily after PD. 8.  ESRD: Continue CCPD daily.Is oliguric, not anuric- voids ~2x/day 9.Acute on chronic anemia: On ESA weekly.   Hemoglobin 7.4 on 3/26, continue to monitor  3/31- Last Hb 7/8- will recheck tomorrow  4/1- Hb 7.5- stable 10.Nutrition: Severe protein malnutrition-Renal/carb modified diet to continue with prostat due to low calorie/proteinmalnutrition. 11. FXT:KWIOXBD blood pressures twice daily. Off hydralazine and furosemide at this time.   Con't clonidine and Norvasc   Controlled on 3/28 12.T2DM with hyperglycemia: A1c 7.8-Continue Lantus daily--d/c meal coverage due to hypoglycemic episodes.Monitor blood sugars AC at bedtime for now due to hypoglycemia.   CBG (last 3)  Recent Labs    04/09/19 1714 04/09/19 2145 04/10/19 0601  GLUCAP 275* 146* 190*   Lantus decreased to 15 units on 3/27 (already received a.m. dose)  Labile with hypoglycemia on 3/28.  Will need to monitor closely overnight.  Bedtime snack ordered for tonight.   3/30- DM coord asked me to increase Lantus to 18 units-   4/2- BGs 146-275- had snacks in room- will wait to make changes 13. Phantom itching- might benefit from nerve pain meds- low dose.  Consider  medications if necessary 14. Leukocytosis: Resolved  3/30- WBC down to 8.9 from 13 yesterday 15. Hyperkalemia  3/30- up to 5.3- will see if renal will address since renal pt- if not, will try Lokelma.  3/31- Renal ordered Lokelma x1- wil recheck labs in AM  4/1- K+ down to 4.4- con't regimen 16. Constipation  3/31- will order Sorbitol 60 ml then follow in 3-4 hours with enema.   4/1- cleaned out- reminded pt to NOT refuse bowel meds 17. LLQ Hematoma  4/2- will monitor hematoma- explained to try and avoid injecting into it with SQ Heparin- if not improving, will order imaging next week.  LOS: 10 days A FACE TO FACE EVALUATION WAS PERFORMED  Letasha Kershaw 04/10/2019, 10:27 AM

## 2019-04-10 NOTE — Progress Notes (Signed)
[facility] Progress Note   Dialysis Orders: [facility]  CCPD 7 Days weekly 80 kg 4 exchanges 1.6 mls Dwell time 1.5 hr, No pause or day dwells. Uses mostly 2.5% solution  BMD meds: -Renvela 800 mg 3 tabs PO TID with meals -Calcitriol 0.5 mcg PO TIW (MWF) -   Assessment/Plan: 1. 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 In CIR.  2. DM/Hyperglycemia: Per primary  3. ESRD - Azotemic, suspected from protein intake and marginal PD Rx. Constipation may have contributed as well.  Increased vol to 2.3L on 3/27, kept 4 exchanges.  Later increased dwell time to 2 hrs.  Constipation now resolved.  Might need a last bag fill with midday drain as an outpatient - does not appear to be an inpatient option per dialysis unit.   1. Continue PD - 1.5% tonight - reassess regimen daily 2. Appreciate optimizing bowels as constipation earlier may have contributed to poor clearance.  3. Clearance improving - defer daily labs for now 4. Hypertension/volume - Amlodipine startedhere on3/13 Significantly under edw, down to 67.7kgon 4/1. Does not appear volume overloaded.  Will need new edw at d/c. Appears on nightly clonidine as outpt as well.  5. Anemia - HGB initially 7.7 fell to 5.4 on3/12 and transfused two units PRBCs.  Update CBC.Heme neg 3/11 Ferritin still increased post surgery to 1420 with drop in tsat to 15% - s/p  a short course of IV Fe (250 x 2) on Aranesp 200/ week - given qThurs. Transfuse as indicated. CTM.  Note FOBT was checked and positive - per primary team. Pt states has hemorrhoids and no overt blood or dark stools per rectum 6. Metabolic bone disease -phos improving on current regimen 7. Nutrition - Albumin low prostat, renal vits.-Renal/carb modified diet.     Subjective:    He feels well today.  BM today - feels that his bowel regimen is good.  UF charted as 1025 mL for 4/2 AM  No UF is available for 4/1 that I see   UF charted as 2151 mL for 3/31 AM  UF is charted as 2838 mL for 3/30 AM with all 2.5%.  Review of systems:  Denies shortness of breath  Denies chest pain  Denies n/v    Objective Vitals:   04/09/19 2004 04/10/19 0501 04/10/19 0700 04/10/19 0707  BP: 127/64 118/68  131/83  Pulse: 72 64  69  Resp: 16 14  16   Temp: 98 F (36.7 C) 99.5 F (37.5 C)  98.2 F (36.8 C)  TempSrc: Oral Oral  Oral  SpO2: 98% 100%  100%  Weight:   68.3 kg   Height:       Physical Exam General:slender male in bed in NAD  HEENT NCAT  Heart: S1S2; no rub Lungs: clear and unlabored  Abdomen: soft nontender nondistended  Extremities:bilateral BKA ; no pitting edema residual limbs Dialysis Access:  PD cath intact    Additional Objective Labs: Basic Metabolic Panel: Recent Labs  Lab 04/07/19 0517 04/09/19 0541 04/10/19 0554  NA 131* 135 133*  K 5.3* 4.4 4.6  CL 92* 95* 93*  CO2 21* 18* 23  GLUCOSE 190* 111* 189*  BUN 111* 107* 101*  CREATININE 13.48* 13.00* 12.79*  CALCIUM 8.8* 8.8* 8.6*  PHOS 5.7* 5.9* 5.9*   Liver Function Tests: Recent Labs  Lab 04/07/19 0517 04/09/19 0541 04/10/19 0554  ALBUMIN 1.7* 1.8* 1.8*   CBC: Recent Labs  Lab 04/06/19 [phone]  04/06/19 0924 04/06/19 1344 04/07/19 0517 04/09/19 0541  WBC 11.2*   < > 13.1* 8.9 8.5  NEUTROABS  --   --   --  5.5 5.0  HGB 5.9*   < > 7.0* 7.8* 7.5*  HCT 17.9*   < > 22.6* 24.9* 24.7*  MCV 91.8  --  97.4 96.5 98.4  PLT 259   < > 223 221 248   < > = values in this interval not displayed.   CBG: Recent Labs  Lab 04/09/19 1219 04/09/19 1714 04/09/19 2145 04/10/19 0601 04/10/19 1218  GLUCAP 141* 275* 146* 190* 174*   Studies/Results: No results found. Medications: . dialysis solution 1.5% low-MG/low-CA    . dialysis solution 2.5% low-MG/low-CA     . amLODipine  10 mg Oral Daily  . aspirin EC  81 mg Oral Daily  . atorvastatin  20 mg Oral q1800  . calcitRIOL  0.5 mcg Oral Once per day on Mon Wed Fri  . cloNIDine   0.1 mg Oral QHS  . darbepoetin (ARANESP) injection - NON-DIALYSIS  200 mcg Subcutaneous Q Thu-1800  . docusate sodium  100 mg Oral BID  . feeding supplement (PRO-STAT SUGAR FREE 64)  30 mL Oral TID BM  . gabapentin  100 mg Oral QHS  . gentamicin cream  1 application Topical Daily  . heparin  5,000 Units Subcutaneous Q8H  . insulin aspart  0-5 Units Subcutaneous QHS  . insulin aspart  0-6 Units Subcutaneous TID WC  . insulin glargine  18 Units Subcutaneous Daily  . multivitamin  1 tablet Oral QHS  . senna  1 tablet Oral BID  . sevelamer carbonate  3,200 mg Oral TID WC  . sodium bicarbonate  650 mg Oral BID    Claudia Desanctis 04/10/2019 2:44 PM

## 2019-04-10 NOTE — Plan of Care (Signed)
  Problem: Consults Goal: RH GENERAL PATIENT EDUCATION Description: See Patient Education module for education specifics. Outcome: Progressing Goal: Skin Care Protocol Initiated - if Braden Score 18 or less Description: If consults are not indicated, leave blank or document N/A Outcome: Progressing Goal: Nutrition Consult-if indicated Outcome: Progressing Goal: Diabetes Guidelines if Diabetic/Glucose > 140 Description: If diabetic or lab glucose is > 140 mg/dl - Initiate Diabetes/Hyperglycemia Guidelines & Document Interventions  Outcome: Progressing   Problem: RH BOWEL ELIMINATION Goal: RH STG MANAGE BOWEL WITH ASSISTANCE Description: STG Manage Bowel with min Assistance. Outcome: Progressing Goal: RH STG MANAGE BOWEL W/MEDICATION W/ASSISTANCE Description: STG Manage Bowel with Medication with mod I Assistance. Outcome: Progressing   Problem: RH BLADDER ELIMINATION Goal: RH STG MANAGE BLADDER WITH ASSISTANCE Description: STG Manage Bladder With mod I Assistance Outcome: Progressing   Problem: RH SKIN INTEGRITY Goal: RH STG SKIN FREE OF INFECTION/BREAKDOWN Description: Patients skin will remain free from further breakdown or infection with min assist. Outcome: Progressing Goal: RH STG MAINTAIN SKIN INTEGRITY WITH ASSISTANCE Description: STG Maintain Skin Integrity With min Assistance. Outcome: Progressing Goal: RH STG ABLE TO PERFORM INCISION/WOUND CARE W/ASSISTANCE Description: STG Able To Perform Incision/Wound Care With max/total Assistance from caregiver. Outcome: Progressing   Problem: RH SAFETY Goal: RH STG ADHERE TO SAFETY PRECAUTIONS W/ASSISTANCE/DEVICE Description: STG Adhere to Safety Precautions With supervision Assistance/Device. Outcome: Progressing Goal: RH STG DECREASED RISK OF FALL WITH ASSISTANCE Description: STG Decreased Risk of Fall With supervision Assistance. Outcome: Progressing   Problem: RH PAIN MANAGEMENT Goal: RH STG PAIN MANAGED AT OR BELOW  PT'S PAIN GOAL Description: < 4 Outcome: Progressing

## 2019-04-11 ENCOUNTER — Inpatient Hospital Stay (HOSPITAL_COMMUNITY): Payer: Medicare (Managed Care)

## 2019-04-11 LAB — GLUCOSE, CAPILLARY
Glucose-Capillary: 175 mg/dL — ABNORMAL HIGH (ref 70–99)
Glucose-Capillary: 130 mg/dL — ABNORMAL HIGH (ref 70–99)
Glucose-Capillary: 82 mg/dL (ref 70–99)
Glucose-Capillary: 99 mg/dL (ref 70–99)
Glucose-Capillary: 256 mg/dL — ABNORMAL HIGH (ref 70–99)

## 2019-04-11 MED ORDER — INSULIN ASPART 100 UNIT/ML ~~LOC~~ SOLN
3.0000 [IU] | Freq: Three times a day (TID) | SUBCUTANEOUS | Status: DC
  Administered 2019-04-12: 3 [IU] via SUBCUTANEOUS

## 2019-04-11 MED ORDER — INSULIN GLARGINE 100 UNIT/ML ~~LOC~~ SOLN
20.0000 [IU] | Freq: Every day | SUBCUTANEOUS | Status: DC
  Administered 2019-04-12 – 2019-04-23 (×12): 20 [IU] via SUBCUTANEOUS
  Filled 2019-04-11 (×14): qty 0.2

## 2019-04-11 NOTE — Progress Notes (Signed)
[facility] Progress Note   Subjective:  Seen in room. Feels well today. No complaints. No issues with PD overnight.   UF 974 mL 4/3  UF charted as 1025 mL for 4/2 AM  No UF is available for 4/1 that I see  UF charted as 2151 mL for 3/31 AM  UF is charted as 2838 mL for 3/30 AM with all 2.5%.  Review of systems:  Denies shortness of breath  Denies chest pain  Denies n/v   Objective Vitals:   04/10/19 1934 04/11/19 0527 04/11/19 0821 04/11/19 0845  BP: (!) 133/59 134/72 136/80 (!) 144/62  Pulse: 71 72 83 77  Resp:      Temp: 98 F (36.7 C) 99.1 F (37.3 C) 100 F (37.8 C)   TempSrc:   Oral   SpO2: 99% 98% 100%   Weight:   68.5 kg   Height:        Weight change:   68.5 kg  Additional Objective Labs: Basic Metabolic Panel: Recent Labs  Lab 04/07/19 0517 04/09/19 0541 04/10/19 0554  NA 131* 135 133*  K 5.3* 4.4 4.6  CL 92* 95* 93*  CO2 21* 18* 23  GLUCOSE 190* 111* 189*  BUN 111* 107* 101*  CREATININE 13.48* 13.00* 12.79*  CALCIUM 8.8* 8.8* 8.6*  PHOS 5.7* 5.9* 5.9*   CBC: Recent Labs  Lab 04/06/19 0924 04/06/19 0924 04/06/19 1344 04/07/19 0517 04/09/19 0541  WBC 11.2*   < > 13.1* 8.9 8.5  NEUTROABS  --   --   --  5.5 5.0  HGB 5.9*   < > 7.0* 7.8* 7.5*  HCT 17.9*   < > 22.6* 24.9* 24.7*  MCV 91.8  --  97.4 96.5 98.4  PLT 259   < > 223 221 248   < > = values in this interval not displayed.   Blood Culture    Component Value Date/Time   SDES BLOOD SITE NOT SPECIFIED 03/19/2019 2016   SPECREQUEST  03/19/2019 2016    BOTTLES DRAWN AEROBIC AND ANAEROBIC Blood Culture adequate volume   CULT  03/19/2019 2016    NO GROWTH 5 DAYS Performed at [facility], [place] [address], [place], [place] [address]    REPTSTATUS 03/24/2019 FINAL 03/19/2019 2016     Physical Exam General:  Well appearing,lying in bed, nad  Heart: RRR No m,r,g Lungs: clear bilaterally  Abdomen: soft non-tender; non-distended  Extremities: bilateral BKA;  no stump edema  Dialysis Access: PD cath intact   Medications: . dialysis solution 1.5% low-MG/low-CA    . dialysis solution 2.5% low-MG/low-CA     . amLODipine  10 mg Oral Daily  . aspirin EC  81 mg Oral Daily  . atorvastatin  20 mg Oral q1800  . calcitRIOL  0.5 mcg Oral Once per day on Mon Wed Fri  . cloNIDine  0.1 mg Oral QHS  . darbepoetin (ARANESP) injection - NON-DIALYSIS  200 mcg Subcutaneous Q Thu-1800  . docusate sodium  100 mg Oral BID  . feeding supplement (PRO-STAT SUGAR FREE 64)  30 mL Oral TID BM  . gabapentin  100 mg Oral QHS  . gentamicin cream  1 application Topical Daily  . heparin  5,000 Units Subcutaneous Q8H  . insulin aspart  0-5 Units Subcutaneous QHS  . insulin aspart  0-6 Units Subcutaneous TID WC  . insulin aspart  3 Units Subcutaneous TID WC  . [START ON 04/12/2019] insulin glargine  20 Units Subcutaneous Daily  .  multivitamin  1 tablet Oral QHS  . senna  1 tablet Oral BID  . sevelamer carbonate  3,200 mg Oral TID WC    Dialysis Orders: CCPD 7 Days weekly 80 kg 4 exchanges 1.6 mls Dwell time 1.5 hr, No pause or day dwells. Uses mostly 2.5% solution  BMD meds: -Renvela 800 mg 3 tabs PO TID with meals -Calcitriol 0.5 mcg PO TIW (MWF) -   Assessment/Plan: 1. 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 In CIR.  2. DM/Hyperglycemia: Per primary  3. ESRD - Azotemic, suspected from protein intake and marginal PD Rx. Constipation may have contributed as well.  Increased vol to 2.3L on 3/27, kept 4 exchanges.  Later increased dwell time to 2 hrs.  Constipation now resolved.  Might need a last bag fill with midday drain as an outpatient - does not appear to be an inpatient option per dialysis unit.   1. Continue PD - 1.5% tonight - reassess regimen daily 2. Appreciate optimizing bowels as constipation earlier may have contributed to poor clearance.  3. Clearance improving - defer daily labs for now 4. Hypertension/volume -  Amlodipine startedhere on3/13 Significantly under edw, down to 67.7kgon 4/1. Does not appear volume overloaded.  Will need new edw at d/c. Appears on nightly clonidine as outpt as well.  5. Anemia - HGB initially 7.7 fell to 5.4on3/12 and transfused two units PRBCs.  Update CBC.Heme neg 3/11 Ferritin still increased post surgery to 1420 with drop in tsat to 15% - s/p  a short course of IV Fe (250 x 2) on Aranesp 200/ week - given qThurs. Transfuse as indicated. CTM.  Note FOBT was checked and positive - per primary team. Pt states has hemorrhoids and no overt blood or dark stools per rectum. Hgb 7s.  6. Metabolic bone disease -phos improving on current regimen 7. Nutrition - Albumin low prostat, renal vits.-Renal/carb modified diet.    Jorge Child PA-C [facility] Pager [phone] 04/11/2019,10:55 AM

## 2019-04-11 NOTE — Plan of Care (Signed)
  Problem: Education: Goal: Ability to verbalize activity precautions or restrictions will improve Outcome: Progressing Goal: Understanding of discharge needs will improve Outcome: Progressing   Problem: Self-Care: Goal: Ability to meet self-care needs will improve Outcome: Progressing

## 2019-04-11 NOTE — Progress Notes (Signed)
[facility]ICAL MEDICINE & REHABILITATION PROGRESS NOTE   Subjective/Complaints:  Overall feels well. Has some phantom pain in legs but it's tolerable.   ROS: Patient denies fever, rash, sore throat, blurred vision, nausea, vomiting, diarrhea, cough, shortness of breath or chest pain or back pain, headache, or mood change.    Objective:   No results found. Recent Labs    04/09/19 0541  WBC 8.5  HGB 7.5*  HCT 24.7*  PLT 248   Recent Labs    04/09/19 0541 04/10/19 0554  NA 135 133*  K 4.4 4.6  CL 95* 93*  CO2 18* 23  GLUCOSE 111* 189*  BUN 107* 101*  CREATININE 13.00* 12.79*  CALCIUM 8.8* 8.6*    Intake/Output Summary (Last 24 hours) at 04/11/2019 1024 Last data filed at 04/11/2019 0800 Gross per 24 hour  Intake 716 ml  Output 260 ml  Net 456 ml     Physical Exam: Vital Signs Blood pressure (!) 144/62, pulse 77, temperature 100 F (37.8 C), temperature source Oral, resp. rate 18, height 4\' 8"  (1.422 m), weight 68.5 kg, SpO2 100 %.  Constitutional: No distress . Vital signs reviewed. HEENT: EOMI, oral membranes moist Neck: supple Cardiovascular: RRR without murmur. No JVD    Respiratory/Chest: CTA Bilaterally without wheezes or rales. Normal effort    GI/Abdomen: BS +, non-tender, non-distended Ext: no clubbing, cyanosis, or edema Psych: pleasant and cooperative Musc: B/L BKAs- well shaped in shrinker socks, abd bruising Neuro: Ox3 Motor:  Bilateral lower extremities: Hip flexion, knee extension 5/5, unchanged  Assessment/Plan: 1. Functional deficits secondary to B/L BKAs due to gangrene/osteomyelitis which require 3+ hours per day of interdisciplinary therapy in a comprehensive inpatient rehab setting.  Physiatrist is providing close team supervision and 24 hour management of active medical problems listed below.  Physiatrist and rehab team continue to assess barriers to discharge/monitor patient progress toward functional and medical goals  Care  Tool:  Bathing    Body parts bathed by patient: Right arm, Left arm, Chest, Abdomen, Face, Front perineal area, Right upper leg, Left upper leg   Body parts bathed by helper: Buttocks Body parts n/a: Right lower leg, Left lower leg   Bathing assist Assist Level: Minimal Assistance - Patient > 75%     Upper Body Dressing/Undressing Upper body dressing   What is the patient wearing?: Pull over shirt    Upper body assist Assist Level: Contact Guard/Touching assist    Lower Body Dressing/Undressing Lower body dressing      What is the patient wearing?: Underwear/pull up, Pants     Lower body assist Assist for lower body dressing: Moderate Assistance - Patient 50 - 74%     Toileting Toileting    Toileting assist Assist for toileting: Minimal Assistance - Patient > 75%     Transfers Chair/bed transfer  Transfers assist     Chair/bed transfer assist level: Contact Guard/Touching assist(sliding board)     Locomotion Ambulation   Ambulation assist   Ambulation activity did not occur: Safety/medical concerns          Walk 10 feet activity   Assist  Walk 10 feet activity did not occur: Safety/medical concerns        Walk 50 feet activity   Assist Walk 50 feet with 2 turns activity did not occur: Safety/medical concerns         Walk 150 feet activity   Assist Walk 150 feet activity did not occur: Safety/medical concerns  Walk 10 feet on uneven surface  activity   Assist Walk 10 feet on uneven surfaces activity did not occur: Safety/medical concerns         Wheelchair     Assist Will patient use wheelchair at discharge?: Yes Type of Wheelchair: Manual    Wheelchair assist level: Independent Max wheelchair distance: 164ft    Wheelchair 50 feet with 2 turns activity    Assist        Assist Level: Independent   Wheelchair 150 feet activity     Assist      Assist Level: Independent   Blood pressure  (!) 144/62, pulse 77, temperature 100 F (37.8 C), temperature source Oral, resp. rate 18, height 4\' 8"  (1.422 m), weight 68.5 kg, SpO2 100 %.  Medical Problem List and Plan: 1.Impaired function/ADLs/mobilitysecondary to B/L BKAs due to osteomyelitis and gangrene- s/p IV ABX- completed 3/18  Continue CIR  2. Antithrombotics: -DVT/anticoagulation:Pharmaceutical:Heparindue to PD/ESRD -antiplatelet therapy:  3. Pain Management:Hydrocodone as needed- per pt, pain meds are suitable/working  4/3-improving. Trying to taper himself 4. Mood: LCSW to follow for evaluation and support -antipsychotic agents:  5. Neuropsych: This patientiscapable of making decisions onhis own behalf. 6. Skin/Wound Care:   Will need to monitor incisions closely given recent fall  4/1- put sign to remind pt to not stand 7. Fluids/Electrolytes/Nutrition:Strict I's and O's. Monitor weights daily after PD. 8.  ESRD: Continue CCPD daily.Is oliguric, not anuric- voids ~2x/day 9.Acute on chronic anemia: On ESA weekly.   Hemoglobin 7.4 on 3/26, continue to monitor  3/31- Last Hb 7/8- will recheck tomorrow  4/1- Hb 7.5- stable 10.Nutrition: Severe protein malnutrition-Renal/carb modified diet to continue with prostat due to low calorie/proteinmalnutrition. 11. BTD:HRCBULA blood pressures twice daily. Off hydralazine and furosemide at this time.   Con't clonidine and Norvasc   Controlled on 4/3 12.T2DM with hyperglycemia: A1c 7.8-Continue Lantus daily--d/c meal coverage due to hypoglycemic episodes.Monitor blood sugars AC at bedtime for now due to hypoglycemia.   CBG (last 3)  Recent Labs    04/10/19 1736 04/10/19 2100 04/11/19 0614  GLUCAP 77 301* 175*   Lantus decreased to 15 units on 3/27 (already received a.m. dose)  Labile with hypoglycemia on 3/28.  Will need to monitor closely overnight.  Bedtime snack ordered for tonight.   3/30- DM coord asked me to increase  Lantus to 18 units-   4/3: cbgs remain elevated, labile. Treating and highs and lows which causes rebound effect 13. Phantom itching- might benefit from nerve pain meds- low dose.  Consider medications if necessary--none yet, d/w patient today 4/3 14. Leukocytosis: Resolved  3/30- WBC down to 8.9 from 13 yesterday 15. Hyperkalemia  3/30- up to 5.3- will see if renal will address since renal pt- if not, will try Lokelma.  3/31- Renal ordered Lokelma x1- wil recheck labs in AM  4/1- K+ down to 4.4- con't regimen 16. Constipation  3/31- will order Sorbitol 60 ml then follow in 3-4 hours with enema.   4/1- cleaned out- reminded pt to NOT refuse bowel meds 17. LLQ Hematoma  4/2- will monitor hematoma- explained to try and avoid injecting into it with SQ Heparin- if not improving, will order imaging next week.  LOS: 11 days A FACE TO FACE EVALUATION WAS PERFORMED  Meredith Staggers 04/11/2019, 10:24 AM

## 2019-04-11 NOTE — Progress Notes (Signed)
Physical Therapy Session Note  Patient Details  Name: Jorge [name] MRN: [id number] Date of Birth: Feb 25, 1970  Today's Date: 04/11/2019 PT Individual Time: 6387-5643 PT Individual Time Calculation (min): 54 min   Short Term Goals: Week 1:  PT Short Term Goal 1 (Week 1): STG = LTG due to short ELOS. Week 2:  PT Short Term Goal 1 (Week 2): STG = LTG due to short ELOS. Week 3:     Skilled Therapeutic Interventions/Progress Updates:    PAIN  Denies pain  Pt initially w/NT having BM, session delayed by 15 min. Supine to sit w/mod assist of 1.  SBT bed to wc w/set up and cga for safety. wc propulsion x `150 including 2 turns mod I, level surface. wc to mat SBT w/close supervision, assist w/wc parts mgmt.  Sit to supine to prone w/supervision In prone performed: bilat hip ext x 15 bilat hs curls x 15 Total time prone 10 min, educated pt importance of maintaining at least 10 hip extension for gait w/prosthesis.   Prone to sidelying w/supervision  Sl hip abduction x 15 each Supine bridge w/bolster under thighs and 3 count hold x 15 SLR x 15 each, cues to maintain knee extesion, tends to lag w/reps.  Supine to sit w/min assist and cues.  Scoots on mat w/supervision.  Mat to wc w/set up assist/supervision. wc propulsion x 133ft mod I. wc to bed close supervision only, pt perfomed set up safely. Sit to supine mod I, scoots in bed w/cues and use of rails.  Pt left supine w/rails up x 3, alarm set, bed in lowest position, and needs in reach.  Therapy Documentation Precautions:  Precautions Precautions: Fall Precaution Comments: wound VACs bilateral residual limbs, limb protectors Restrictions Weight Bearing Restrictions: No RLE Weight Bearing: Non weight bearing LLE Weight Bearing: Non weight bearing   Therapy/Group: Individual Therapy  Callie Fielding, PT   Jerrilyn Cairo 04/11/2019, 4:07 PM

## 2019-04-12 ENCOUNTER — Inpatient Hospital Stay (HOSPITAL_COMMUNITY): Payer: Medicare (Managed Care)

## 2019-04-12 LAB — OCCULT BLOOD X 1 CARD TO LAB, STOOL: Fecal Occult Bld: POSITIVE — AB

## 2019-04-12 LAB — GLUCOSE, CAPILLARY
Glucose-Capillary: 258 mg/dL — ABNORMAL HIGH (ref 70–99)
Glucose-Capillary: 162 mg/dL — ABNORMAL HIGH (ref 70–99)
Glucose-Capillary: 86 mg/dL (ref 70–99)
Glucose-Capillary: 134 mg/dL — ABNORMAL HIGH (ref 70–99)
Glucose-Capillary: 68 mg/dL — ABNORMAL LOW (ref 70–99)
Glucose-Capillary: 78 mg/dL (ref 70–99)

## 2019-04-12 MED ORDER — GABAPENTIN 100 MG PO CAPS
200.0000 mg | ORAL_CAPSULE | Freq: Every day | ORAL | Status: DC
  Administered 2019-04-12 – 2019-04-16 (×5): 200 mg via ORAL
  Filled 2019-04-12 (×5): qty 2

## 2019-04-12 MED ORDER — INSULIN ASPART 100 UNIT/ML ~~LOC~~ SOLN
5.0000 [IU] | Freq: Three times a day (TID) | SUBCUTANEOUS | Status: DC
  Administered 2019-04-12 – 2019-04-22 (×11): 5 [IU] via SUBCUTANEOUS

## 2019-04-12 NOTE — Progress Notes (Signed)
Hypoglycemic Event  CBG: 68 @2122   Treatment: Phillip Heal crackers w/ peanut butter, ice cream cup  Symptoms: n/a  Follow-up CBG: Time:@2154   CBG Result:78  Possible Reasons for Event: Intake, needs HS snack prior to insulin check  Comments/MD notified: Will continue to monitor    Jorge [name]

## 2019-04-12 NOTE — Progress Notes (Signed)
   04/12/19 1050  What Happened  Was fall witnessed? Yes  Who witnessed fall? Cherie Grunenburg PT  Patients activity before fall to/from bed, chair, or stretcher  Point of contact other (comment) (stumps-more on left side)  Was patient injured? No  Follow Up  MD notified MD Naaman Plummer  Time MD notified 13  Family notified No - patient refusal  Additional tests No  Adult Fall Risk Assessment  Risk Factor Category (scoring not indicated) Fall has occurred during this admission (document High fall risk)  Patient Fall Risk Level High fall risk  Adult Fall Risk Interventions  Required Bundle Interventions *See Row Information* High fall risk - low, moderate, and high requirements implemented  Additional Interventions Use of appropriate toileting equipment (bedpan, BSC, etc.)  Screening for Fall Injury Risk (To be completed on HIGH fall risk patients) - Assessing Need for Low Bed  Risk For Fall Injury- Low Bed Criteria Previous fall this admission  Will Implement Low Bed and Floor Mats Yes  Screening for Fall Injury Risk (To be completed on HIGH fall risk patients who do not meet crieteria for Low Bed) - Assessing Need for Floor Mats Only  Risk For Fall Injury- Criteria for Floor Mats None identified - No additional interventions needed  Vitals  BP (!) 118/45  BP Location Left Arm  BP Method Automatic  Patient Position (if appropriate) Sitting  Pulse Rate 81  Pulse Rate Source Dinamap  Resp 18  Oxygen Therapy  SpO2 99 %  O2 Device Room Air  Pain Assessment  Pain Scale 0-10  Pain Score 0  Neurological  Neuro (WDL) X  Level of Consciousness Alert  Orientation Level Oriented X4  Cognition Appropriate at baseline  Speech Clear  RUE Motor Response Purposeful movement  LUE Motor Response Purposeful movement  RLE Motor Response Purposeful movement  LLE Motor Response Purposeful movement  Musculoskeletal  Musculoskeletal (WDL) X  Assistive Device Sliding board;Wheelchair   Generalized Weakness Yes  Weight Bearing Restrictions Yes  RLE Weight Bearing NWB  LLE Weight Bearing NWB

## 2019-04-12 NOTE — Progress Notes (Signed)
[facility] Progress Note   Subjective:  Seen in room. Feels well today. Completed PD overnight with 1L UF. No issues on PD. Answered questions about choice of solution.    Denies shortness of breath  Denies chest pain  Denies n/v   Objective Vitals:   04/12/19 0632 04/12/19 0745 04/12/19 0941 04/12/19 1050  BP: (!) 152/61 (!) 151/65 (!) 135/41 (!) 118/45  Pulse: 73 73 76 81  Resp: 15 16  18   Temp: 98.1 F (36.7 C) 98.1 F (36.7 C)    TempSrc: Oral Oral    SpO2: 100% 100%  99%  Weight:  69.7 kg    Height:        Weight change:   69.7 kg  Additional Objective Labs: Basic Metabolic Panel: Recent Labs  Lab 04/07/19 0517 04/09/19 0541 04/10/19 0554  NA 131* 135 133*  K 5.3* 4.4 4.6  CL 92* 95* 93*  CO2 21* 18* 23  GLUCOSE 190* 111* 189*  BUN 111* 107* 101*  CREATININE 13.48* 13.00* 12.79*  CALCIUM 8.8* 8.8* 8.6*  PHOS 5.7* 5.9* 5.9*   CBC: Recent Labs  Lab 04/06/19 0924 04/06/19 0924 04/06/19 1344 04/07/19 0517 04/09/19 0541  WBC 11.2*   < > 13.1* 8.9 8.5  NEUTROABS  --   --   --  5.5 5.0  HGB 5.9*   < > 7.0* 7.8* 7.5*  HCT 17.9*   < > 22.6* 24.9* 24.7*  MCV 91.8  --  97.4 96.5 98.4  PLT 259   < > 223 221 248   < > = values in this interval not displayed.   Blood Culture    Component Value Date/Time   SDES BLOOD SITE NOT SPECIFIED 03/19/2019 2016   SPECREQUEST  03/19/2019 2016    BOTTLES DRAWN AEROBIC AND ANAEROBIC Blood Culture adequate volume   CULT  03/19/2019 2016    NO GROWTH 5 DAYS Performed at [facility], [place] [address], [place], [place] [address]    REPTSTATUS 03/24/2019 FINAL 03/19/2019 2016     Physical Exam General:  Well appearing,lying in bed, nad  Heart: RRR No m,r,g Lungs: clear bilaterally  Abdomen: soft non-tender; non-distended  Extremities: bilateral BKA; no stump edema  Dialysis Access: PD cath intact   Medications: . dialysis solution 1.5% low-MG/low-CA    . dialysis solution 2.5%  low-MG/low-CA     . amLODipine  10 mg Oral Daily  . aspirin EC  81 mg Oral Daily  . atorvastatin  20 mg Oral q1800  . calcitRIOL  0.5 mcg Oral Once per day on Mon Wed Fri  . cloNIDine  0.1 mg Oral QHS  . darbepoetin (ARANESP) injection - NON-DIALYSIS  200 mcg Subcutaneous Q Thu-1800  . docusate sodium  100 mg Oral BID  . feeding supplement (PRO-STAT SUGAR FREE 64)  30 mL Oral TID BM  . gabapentin  200 mg Oral QHS  . gentamicin cream  1 application Topical Daily  . heparin  5,000 Units Subcutaneous Q8H  . insulin aspart  0-5 Units Subcutaneous QHS  . insulin aspart  0-6 Units Subcutaneous TID WC  . insulin aspart  5 Units Subcutaneous TID WC  . insulin glargine  20 Units Subcutaneous Daily  . multivitamin  1 tablet Oral QHS  . senna  1 tablet Oral BID  . sevelamer carbonate  3,200 mg Oral TID WC    Dialysis Orders: CCPD 7 Days weekly 80 kg 4 exchanges 1.6 mls Dwell time 1.5 hr,  No pause or day dwells. Uses mostly 2.5% solution  BMD meds: -Renvela 800 mg 3 tabs PO TID with meals -Calcitriol 0.5 mcg PO TIW (MWF) -   Assessment/Plan: 1. 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 In CIR.  2. DM/Hyperglycemia: Per primary  3. ESRD - Azotemic, suspected from protein intake and marginal PD Rx.  Increased vol to 2.3L on 3/27, kept 4 exchanges.  Later increased dwell time to 2 hrs.  Might need a last bag fill with midday drain as an outpatient - does not appear to be an inpatient option per dialysis unit.  Constipation now resolved.  1. Continue PD - Mix 1.5, 2.5% Discussed with patient  2. Appreciate optimizing bowels as constipation earlier may have contributed to poor clearance.  3. Clearance improving - check labs in am.  4. Hypertension/volume - Amlodipine startedhere on3/13 Significantly under edw, down to 67.7kgon 4/1. Does not appear volume overloaded.  Will need new edw at d/c. Appears on nightly clonidine as outpt as well.  5. Anemia - HGB  initially 7.7 fell to 5.4on3/12 and transfused two units PRBCs.  Heme neg 3/11 Ferritin still increased post surgery to 1420 with drop in tsat to 15% - s/p  a short course of IV Fe (250 x 2) on Aranesp 200/ week - given qThurs. Transfuse as indicated. CTM.  Note FOBT was checked and positive - per primary team. Pt states has hemorrhoids and no overt blood or dark stools per rectum. Hgb 7s. Update CBC.  6. Metabolic bone disease -phos improving on current regimen 7. Nutrition - Albumin low prostat, renal vits.-Renal/carb modified diet.    Lynnda Child PA-C [facility] Pager [phone] 04/12/2019,11:51 AM

## 2019-04-12 NOTE — Progress Notes (Addendum)
[facility] PHYSICAL MEDICINE & REHABILITATION PROGRESS NOTE   Subjective/Complaints:  Pt having phantom limb pain, sometimes quite severe. Refusing bowel meds as per RN note  ROS: Patient denies fever, rash, sore throat, blurred vision, nausea, vomiting, diarrhea, cough, shortness of breath or chest pain,   headache, or mood change.   Objective:   No results found. No results for input(s): WBC, HGB, HCT, PLT in the last 72 hours. Recent Labs    04/10/19 0554  NA 133*  K 4.6  CL 93*  CO2 23  GLUCOSE 189*  BUN 101*  CREATININE 12.79*  CALCIUM 8.6*    Intake/Output Summary (Last 24 hours) at 04/12/2019 0905 Last data filed at 04/12/2019 0730 Gross per 24 hour  Intake 864 ml  Output 825 ml  Net 39 ml     Physical Exam: Vital Signs Blood pressure (!) 151/65, pulse 73, temperature 98.1 F (36.7 C), temperature source Oral, resp. rate 16, height 4\' 8"  (1.422 m), weight 69.7 kg, SpO2 100 %.  Constitutional: No distress . Vital signs reviewed. HEENT: EOMI, oral membranes moist Neck: supple Cardiovascular: RRR without murmur. No JVD    Respiratory/Chest: CTA Bilaterally without wheezes or rales. Normal effort    GI/Abdomen: BS +, non-tender, non-distended Ext: no clubbing, cyanosis, or edema Psych: pleasant and cooperative Musc: B/L BKAs- well shaped in shrinker socks, abd bruising Neuro: Ox3, reasonable insight and awareness Motor:  Bilateral lower extremities: Hip flexion, knee extension 5/5, unchanged  Assessment/Plan: 1. Functional deficits secondary to B/L BKAs due to gangrene/osteomyelitis which require 3+ hours per day of interdisciplinary therapy in a comprehensive inpatient rehab setting.  Physiatrist is providing close team supervision and 24 hour management of active medical problems listed below.  Physiatrist and rehab team continue to assess barriers to discharge/monitor patient progress toward functional and medical goals  Care Tool:  Bathing    Body  parts bathed by patient: Right arm, Left arm, Chest, Abdomen, Face, Front perineal area, Right upper leg, Left upper leg   Body parts bathed by helper: Buttocks Body parts n/a: Right lower leg, Left lower leg   Bathing assist Assist Level: Minimal Assistance - Patient > 75%     Upper Body Dressing/Undressing Upper body dressing   What is the patient wearing?: Pull over shirt    Upper body assist Assist Level: Contact Guard/Touching assist    Lower Body Dressing/Undressing Lower body dressing      What is the patient wearing?: Underwear/pull up, Pants     Lower body assist Assist for lower body dressing: Moderate Assistance - Patient 50 - 74%     Toileting Toileting    Toileting assist Assist for toileting: Minimal Assistance - Patient > 75%     Transfers Chair/bed transfer  Transfers assist     Chair/bed transfer assist level: Supervision/Verbal cueing     Locomotion Ambulation   Ambulation assist   Ambulation activity did not occur: Safety/medical concerns          Walk 10 feet activity   Assist  Walk 10 feet activity did not occur: Safety/medical concerns        Walk 50 feet activity   Assist Walk 50 feet with 2 turns activity did not occur: Safety/medical concerns         Walk 150 feet activity   Assist Walk 150 feet activity did not occur: Safety/medical concerns         Walk 10 feet on uneven surface  activity   Assist Walk  10 feet on uneven surfaces activity did not occur: Safety/medical concerns         Wheelchair     Assist Will patient use wheelchair at discharge?: Yes Type of Wheelchair: Manual    Wheelchair assist level: Independent Max wheelchair distance: 133ft    Wheelchair 50 feet with 2 turns activity    Assist        Assist Level: Independent   Wheelchair 150 feet activity     Assist      Assist Level: Independent   Blood pressure (!) 151/65, pulse 73, temperature 98.1 F (36.7  C), temperature source Oral, resp. rate 16, height 4\' 8"  (1.422 m), weight 69.7 kg, SpO2 100 %.  Medical Problem List and Plan: 1.Impaired function/ADLs/mobilitysecondary to B/L BKAs due to osteomyelitis and gangrene- s/p IV ABX- completed 3/18  Continue CIR  2. Antithrombotics: -DVT/anticoagulation:Pharmaceutical:Heparindue to PD/ESRD -antiplatelet therapy:  3. Pain Management:Hydrocodone as needed- per pt, pain meds are suitable/working  4/4 increase gabapentin to 200mg  qhs for phantom limb pain 4. Mood: LCSW to follow for evaluation and support -antipsychotic agents:  5. Neuropsych: This patientiscapable of making decisions onhis own behalf. 6. Skin/Wound Care:   Will need to monitor incisions closely given recent fall  4/1- put sign to remind pt to not stand 7. Fluids/Electrolytes/Nutrition:Strict I's and O's. Monitor weights daily after PD. 8.  ESRD: Continue CCPD daily.Is oliguric, not anuric- voids ~2x/day 9.Acute on chronic anemia: On ESA weekly.   Hemoglobin 7.4 on 3/26, continue to monitor  3/31- Last Hb 7/8- will recheck tomorrow  4/1- Hb 7.5- stable 10.Nutrition: Severe protein malnutrition-Renal/carb modified diet to continue with prostat due to low calorie/proteinmalnutrition. 11. LAG:TXMIWOE blood pressures twice daily. Off hydralazine and furosemide at this time.   Con't clonidine and Norvasc   Controlled on 4/4 12.T2DM with hyperglycemia: A1c 7.8-Continue Lantus daily--d/c meal coverage due to hypoglycemic episodes.Monitor blood sugars AC at bedtime for now due to hypoglycemia.   CBG (last 3)  Recent Labs    04/11/19 1634 04/11/19 2147 04/12/19 0634  GLUCAP 99 256* 258*   Lantus decreased to 15 units on 3/27 (already received a.m. dose)  Labile with hypoglycemia on 3/28.  Will need to monitor closely overnight.  Bedtime snack ordered for tonight.   3/30- DM coord asked me to increase Lantus to 18 units-    4/3: cbgs remain elevated, labile. Treating and highs and lows which causes rebound effect  4/4 Lantus 20u daily started today, increase mealtime covg to 5u  14. Leukocytosis: Resolved  3/30- WBC down to 8.9 from 13 yesterday 15. Hyperkalemia  3/30- up to 5.3- will see if renal will address since renal pt- if not, will try Lokelma.  3/31- Renal ordered Lokelma x1- wil recheck labs in AM  4/1- K+ down to 4.4- con't regimen 16. Constipation  3/31- will order Sorbitol 60 ml then follow in 3-4 hours with enema.   4/1- cleaned out- reminded pt to NOT refuse bowel meds  4/4 reiterating use/need of preventative bowel meds 17. LLQ Hematoma  4/2- will monitor hematoma- explained to try and avoid injecting into it with SQ Heparin- if not improving, will order imaging next week.  LOS: 12 days A FACE TO FACE EVALUATION WAS PERFORMED  Meredith Staggers 04/12/2019, 9:05 AM

## 2019-04-12 NOTE — Progress Notes (Signed)
Physical Therapy Session Note  Patient Details  Name: Jorge [name] MRN: [id number] Date of Birth: 05-Nov-1970  Today's Date: 04/12/2019 PT Individual Time: 1005-1110 and 7829-5621 PT Individual Time Calculation (min): 65 min and 30 min   Short Term Goals: Week 2:  PT Short Term Goal 1 (Week 2): STG = LTG due to short ELOS.  Skilled Therapeutic Interventions/Progress Updates:     Session 1: Patient in bed with B shrinkers donned upon PT arrival. Patient alert and agreeable to PT session. Patient denied pain during session.   Patient had an assisted fall onto his residual limbs during this session, details below, RN and charge nurse made aware. Vitals:  BP 118/45, HR 81, SPO2 99% on RA. Noted mild bleeding from L incision, applied gauze with pressure x5 min with no further bleeding. Patient denied pain after and was able to continue with session. Discussed board placement for lateral scoot transfers with the SB and attempted A/P transfers and wearing limb guards during transfers to attempt to protect the patient from this event in the future. MD made aware and incident by charge nurse.  Therapeutic Activity: Bed Mobility: Patient performed supine to sit with min A due to decreased UE and abdominal strength this session. Provided verbal cues for pushing through his elbows to his hands. Transfers: Patient performed a slide board transfer bed<>w/c requiring 2 attempts for bed>w/c due to sliding forward off the board and touching down residual limbs before being assisted back onto the bed with max A form PT. Otherwise, he performed slide board transfers with CGA and w/c stabilization and supervision for board placement. Provided cues for hand placement, board placement (must be places in middle of thigh with board showing between thighs for correct weight distribution due to no LE support), and head-hips relationship for proper technique and decreased assist with transfers.  He attempted a A/P  transfer bed>w/c with the slide board due to patient being unable to lift his hips off the bed with UEs. He initiated the transfer with min A then his UEs fatigued and he laid back onto the w/c and was unable to complete the transfer. Required max A to return to the bed. Provided cues for head hips relationship, board and w/c placement for A/P transfer, and safety awareness. Patient performed a simulated sedan height car transfer with min A-CGA using a 24" slide board. Provided cues for safe technique, hand placement, and board and w/c placement.  Discussed performing slide board transfers with limb guards during next session for improved protection of residual limbs. Patient has a decreased success with this in the past due to the limb guard sliding on the board and throwing off his balance. Patient was agreeable to attempt during next session.  Wheelchair Mobility:  Patient was transported in the w/c with total A throughout session for energy conservation and time management.  Patient in bed at end of session with breaks locked, bed alarm set, and all needs within reach.   Session 2: Patient in bed on the bed pan upon PT arrival. Patient agreeable to have PT come back after seeing another patient to allow him to complete toileting.  Patient in bed with B shrinkers donned upon PT return. Patient alert and agreeable to PT session. Patient denied pain during session. Reported no change in either residual limb and good report from nursing about incision site that was inspected prior to session. Patient donned B limb guards independently at beginning of session in long sitting and wore  them throughout.  Therapeutic Activity: Bed Mobility: Patient performed supine to/from sit with min A as above. Continues to require increased time and effort to come to sitting today.  Transfers: Patient performed a slide board transfer bed<>w/c with B limb guards donned with CGA-min A due to posterior LOB and  supervision-min A for board placement with limb guards. Provided cues for hand placement, board placement, and head-hips relationship for proper technique and decreased assist with transfers.   Wheelchair Mobility:  Patient propelled wheelchair 100-150 feet x4 with supervision-mod I to go from his room, on/off the elevators and out the [facility] entrance and back for UE strengthening/endurance and community mobility. Provided verbal cues for performing tight turns and equal propulsion of B UEs with fatigue (tends to veer to the L).  Patient sat outside eating chocolate ice cream provided by NT due to low CBG prior to session. Discussed d/c planning, home measurements, fall risk prevention, physical assist for transfers throughout the day which patient states is able to be arranged, and set up family education Tuesday 1-3pm with his son and daughter and a car transfer at d/c with his sister.   Patient in bed at end of session with breaks locked, bed alarm set, and all needs within reach.     Therapy Documentation Precautions:  Precautions Precautions: Fall Precaution Comments: wound VACs bilateral residual limbs, limb protectors Restrictions Weight Bearing Restrictions: Yes RLE Weight Bearing: Non weight bearing LLE Weight Bearing: Non weight bearing    Therapy/Group: Individual Therapy  Beckey Polkowski L Guida Asman PT, DPT  04/12/2019, 5:31 PM

## 2019-04-12 NOTE — Progress Notes (Signed)
Pt has been refusing scheduled stool softners ever since 'blow-out' episode. Nurse explained importance of adhering to medication regimen to avoid further constipation, however pt adamant on not taking any bowel meds.

## 2019-04-13 ENCOUNTER — Inpatient Hospital Stay (HOSPITAL_COMMUNITY): Payer: Medicare (Managed Care)

## 2019-04-13 ENCOUNTER — Inpatient Hospital Stay (HOSPITAL_COMMUNITY): Payer: Medicare (Managed Care) | Admitting: Physical Therapy

## 2019-04-13 ENCOUNTER — Inpatient Hospital Stay (HOSPITAL_COMMUNITY): Payer: Medicare (Managed Care) | Admitting: Occupational Therapy

## 2019-04-13 LAB — RENAL FUNCTION PANEL
Albumin: 1.7 g/dL — ABNORMAL LOW (ref 3.5–5.0)
Anion gap: 17 — ABNORMAL HIGH (ref 5–15)
BUN: 106 mg/dL — ABNORMAL HIGH (ref 6–20)
CO2: 21 mmol/L — ABNORMAL LOW (ref 22–32)
Calcium: 8.4 mg/dL — ABNORMAL LOW (ref 8.9–10.3)
Chloride: 94 mmol/L — ABNORMAL LOW (ref 98–111)
Creatinine, Ser: 12.26 mg/dL — ABNORMAL HIGH (ref 0.61–1.24)
GFR calc Af Amer: 5 mL/min — ABNORMAL LOW (ref >60)
GFR calc non Af Amer: 4 mL/min — ABNORMAL LOW (ref >60)
Glucose, Bld: 126 mg/dL — ABNORMAL HIGH (ref 70–99)
Phosphorus: 5.5 mg/dL — ABNORMAL HIGH (ref 2.5–4.6)
Potassium: 4.3 mmol/L (ref 3.5–5.1)
Sodium: 132 mmol/L — ABNORMAL LOW (ref 135–145)

## 2019-04-13 LAB — CBC
HCT: 25.9 % — ABNORMAL LOW (ref 39.0–52.0)
Hemoglobin: 8 g/dL — ABNORMAL LOW (ref 13.0–17.0)
MCH: 29.9 pg (ref 26.0–34.0)
MCHC: 30.9 g/dL (ref 30.0–36.0)
MCV: 96.6 fL (ref 80.0–100.0)
Platelets: 222 10*3/uL (ref 150–400)
RBC: 2.68 MIL/uL — ABNORMAL LOW (ref 4.22–5.81)
RDW: 15 % (ref 11.5–15.5)
WBC: 11 10*3/uL — ABNORMAL HIGH (ref 4.0–10.5)
nRBC: 0 % (ref 0.0–0.2)

## 2019-04-13 LAB — GLUCOSE, CAPILLARY
Glucose-Capillary: 110 mg/dL — ABNORMAL HIGH (ref 70–99)
Glucose-Capillary: 140 mg/dL — ABNORMAL HIGH (ref 70–99)
Glucose-Capillary: 110 mg/dL — ABNORMAL HIGH (ref 70–99)
Glucose-Capillary: 171 mg/dL — ABNORMAL HIGH (ref 70–99)

## 2019-04-13 MED ORDER — GENTAMICIN SULFATE 0.1 % EX CREA
1.0000 "application " | TOPICAL_CREAM | Freq: Every day | CUTANEOUS | Status: DC
  Administered 2019-04-14 – 2019-04-23 (×9): 1 via TOPICAL
  Filled 2019-04-13: qty 15

## 2019-04-13 MED ORDER — DELFLEX-LC/1.5% DEXTROSE 344 MOSM/L IP SOLN: INTRAPERITONEAL | Status: DC

## 2019-04-13 MED ORDER — HEPARIN 1000 UNIT/ML FOR PERITONEAL DIALYSIS: 500.0000 [IU] | INTRAMUSCULAR | Status: DC | PRN

## 2019-04-13 NOTE — Progress Notes (Signed)
Physical Therapy Session Note  Patient Details  Name: Jorge [name] MRN: [id number] Date of Birth: 1970-10-04  Today's Date: 04/13/2019 PT Individual Time: 0904-1001 PT Individual Time Calculation (min): 57 min   Short Term Goals: Week 2:  PT Short Term Goal 1 (Week 2): STG = LTG due to short ELOS.  Skilled Therapeutic Interventions/Progress Updates:     Patient in bed upon PT arrival. Patient alert and agreeable to PT session. Patient reported 4-5/10 hamstring pain during session, RN made aware. PT provided repositioning, rest breaks, and distraction as pain interventions throughout session. Discussed d/c planning for CGA-min A transfers for safety to maintain balance and prevent falls. Patient reports that his sister cannot provide min assist, but can provide CGA, however, she is unavailable to attend family education on Tuesday, 4/6. Patient stated that a friend will be staying with him when his sister is not home and his kids will be teaching his friend and sister what they learn in family education tomorrow. He stated that he will not be left home alone for >1-2 hours at a time. Discussed high fall risk and importance of having trained support. Suggested extending LOS to accommodate additional family education and progress to consistent CGA transfers. Patient stated that he did no want to delay d/c. Will determine safety of d/c to his sister's house on Wednesday based on family education session tomorrow.   Discussed fall yesterday and encourage patient to attempt transfer with B limb guards. He stated that it is much harder and his sister will not be able to provide the assist the therapist provide here for him to perform those transfers.   Had patient doff L shrinker to self-assess L incision site using hand held mirror. Noted small open areas from fall yesterday were dry, without drainage, and without signs of infection. Patient denied pain at either limbs incision site. Patient then donned  his L shrinker with min A-supervision for placement.  Therapeutic Activity: Bed Mobility: Patient performed supine to/from sit with supervision with increased time to come to sitting. Provided verbal cues for pushing through elbows to hands to come to long sitting. Transfers: Patient performed a slide board transfers bed<>w/c and w/c>mat table with CGA-min A for intermittent steadying assist and supervision for board placement. Provided cues for hand placement, board placement (between hip and knee for improved balance due to B BKA), and head-hips relationship for proper technique and decreased assist with transfers. Transfer w/c>bed performed with B limb guards donned with min A throughout for scooting hips along the board and min A for board placement.  He performed a A/P transfer from the mat table to the w/c with CGA-min A and stabilization of w/c. Patient leaned back into the w/c once on the edge of the seat before completely scooting back in the chair showing poor safety awareness during transfer. PT educated patient of importance of scooting all the way back in the chair to reduce fall risk, patient stated understanding. Patient attempted to scoot forward out of the chair, however, was unable to initiate the transfer with mod A due to UE fatigue. Patient performed a simulated sedan height car transfer with CGA-min A using a slide board. Provided cues for safe technique.  Wheelchair Mobility:  Patient propelled wheelchair >250 feet with mod I. He managed his L amputee pad independently throughout session, total A for R limb pad due to increased challenge with latch with this particular pad. He performed w/c set up for all transfers with min cues  to mod I throughout session.  Neuromuscular Re-ed: Patient performed the following motor learning activities for improved functional mobility with A/P transfers: Reciprocal scooting forwards and backwards on a mat table with manual facilitation for lateral  leans and cues for maintaining knee extension for protection of incisions on B residual limbs.  Patient in bed at end of session with breaks locked, bed alarm set, and all needs within reach. Patient continues to decline sitting in the w/c during the day. He states that he feels safer in the bed. Despite education of benefits of sitting in the chair, strategies for pressure relief, and increased encouragement, patient continued to return to the bed at the end of the session.    Therapy Documentation Precautions:  Precautions Precautions: Fall Precaution Comments: wound VACs bilateral residual limbs, limb protectors Restrictions Weight Bearing Restrictions: Yes RLE Weight Bearing: Non weight bearing LLE Weight Bearing: Non weight bearing    Therapy/Group: Individual Therapy  Dorrine Montone L Manny Vitolo PT, DPT  04/13/2019, 12:55 PM

## 2019-04-13 NOTE — Progress Notes (Signed)
Social Work Patient ID: Jorge [name], male   DOB: [birth year], 49 y.o.   MRN: [id number]   Family education for Tuesday, 4/6 1pm-3pm with pt son and dtr.    Loralee Pacas, MSW, [place] Office: [phone] Cell: [phone] Fax: [phone]

## 2019-04-13 NOTE — Progress Notes (Signed)
Occupational Therapy Session Note  Patient Details  Name: KHAYREE DELELLIS MRN: [id number] Date of Birth: 07-22-1970  Today's Date: 04/13/2019 OT Individual Time: 1030-1130 OT Individual Time Calculation (min): 60 min    Short Term Goals: Week 2:  OT Short Term Goal 1 (Week 2): Patient will complete SB transfers to/from drop arm commode, bed and w/c with CS OT Short Term Goal 2 (Week 2): patient will complete toileting at drop arm commode with CS/set up  Skilled Therapeutic Interventions/Progress Updates:    Patient in bed, alert and agrees to get OOB, states that bilateral hamstrings sore but not limiting mobility.  Supine to sit with CS.  SB transfer bed to w/c with CGA.  LB dressing bed level with set up, UB dressing, bathing w/c level with set up, grooming w/c level mod .  He is able to propel w/c 36feet w/ DS.  Completed UB w/c push ups x10 (unable to clear buttocks), UB ergometer 7 minutes, theraband tricep/scapular ex x20 reps B.  SB transfer w/c to bed with CS - he is able to set up w/c and place board without cues this attempt.  Provided ice packs to back of B LEs in attempt to relieve ongoing pain.  Bed alarm set and call bell in reach  Therapy Documentation Precautions:  Precautions Precautions: Fall Precaution Comments: wound VACs bilateral residual limbs, limb protectors Restrictions Weight Bearing Restrictions: Yes RLE Weight Bearing: Non weight bearing LLE Weight Bearing: Non weight bearing   Therapy/Group: Individual Therapy  Carlos Levering 04/13/2019, 7:35 AM

## 2019-04-13 NOTE — Progress Notes (Signed)
Physical Therapy Session Note  Patient Details  Name: Jorge [name] MRN: [id number] Date of Birth: 12/25/1970  Today's Date: 04/13/2019 PT Individual Time: 1300-1400 PT Individual Time Calculation (min): 60 min   Short Term Goals: Week 2:  PT Short Term Goal 1 (Week 2): STG = LTG due to short ELOS.  Skilled Therapeutic Interventions/Progress Updates:    Patient received in bed; noted his brief was soiled and he was able to roll with full independence/use of bed rails and needed pericare for posterior cleaning, able to clean his front with set up. Also able to don new brief and shorts from supine with set up assist. From there needed CGA for supine to sit due to increased weakness today, also needed MinA for SBT to chair due to end of board on bed starting to slide off the edge of the bed as mattress compressed. Able to independently self-propel WC distances of 150ft today but with slow pace due to BUEs being sore from exercises earlier. Transferred to and from mat table with S-min guard and board. From there had him get in prone and assessed his hamstrings as he has reported B HS pain earlier today, however he clarified that his pain was actually in his R calf. Examined this area which was very sore and tender to touch, R distal calf much more swollen than L, and distal end of R residual limb much warmer than the left as well. Got PA and asked them to assess LE, PA recommends keeping shrinker off of this limb for a couple of hours and will follow up with MD, ok to continue therapy. At this point due to time limitations returned to his room and transferred back to bed with min guard. Left in bed with all needs met, bed alarm active this afternoon. 15 minutes lost due to organizing medical team assessment of patient during this session.   Therapy Documentation Precautions:  Precautions Precautions: Fall Precaution Comments: wound VACs bilateral residual limbs, limb protectors Restrictions Weight  Bearing Restrictions: Yes RLE Weight Bearing: Non weight bearing LLE Weight Bearing: Non weight bearing General: PT Amount of Missed Time (min): 15 Minutes PT Missed Treatment Reason: Other (Comment)(PA assessing patient) Pain: Pain Assessment Pain Scale: Faces Faces Pain Scale: Hurts whole lot Pain Type: Acute pain Pain Location: Calf Pain Orientation: Right;Posterior Pain Descriptors / Indicators: Aching;Sharp Pain Onset: On-going Patients Stated Pain Goal: 0 Pain Intervention(s): Other (Comment)(PA notified and came to assess) Multiple Pain Sites: No    Therapy/Group: Individual Therapy    U PT, DPT, PN1   Supplemental Physical Therapist [place]    Pager [phone] Acute Rehab Office [phone]    04/13/2019, 3:49 PM  

## 2019-04-13 NOTE — Progress Notes (Signed)
[facility] PHYSICAL MEDICINE & REHABILITATION PROGRESS NOTE   Subjective/Complaints:  Pt reports still has knot in L side- thought now had one on right abdomen.,  [place] yesterday when bed alarm went off and scared him transferring on SB- "jumped off it".  Tipped L BKA per pt-  Also concerned BGs have been low.    ROS:  Pt denies SOB, abd pain, CP, N/V/C/D, and vision changes  Objective:   No results found. Recent Labs    04/13/19 0537  WBC 11.0*  HGB 8.0*  HCT 25.9*  PLT 222   Recent Labs    04/13/19 0537  NA 132*  K 4.3  CL 94*  CO2 21*  GLUCOSE 126*  BUN 106*  CREATININE 12.26*  CALCIUM 8.4*    Intake/Output Summary (Last 24 hours) at 04/13/2019 1528 Last data filed at 04/13/2019 1025 Gross per 24 hour  Intake 300 ml  Output 475 ml  Net -175 ml     Physical Exam: Vital Signs Blood pressure (!) 121/52, pulse 83, temperature 98.6 F (37 C), temperature source Oral, resp. rate 18, height 4\' 8"  (1.422 m), weight 67.3 kg, SpO2 100 %.  Constitutional:sitting up in bed; reading phone, NAD HEENT: EOMI, oral membranes moist Neck: supple Cardiovascular: RRR_ no JVD   Respiratory/Chest: CTA b/L- good air movement  GI/Abdomen: soft, NT, ND, a knot under skin on LLQ smaller than Friday ~ large grape; (+)BS Ext: no clubbing, cyanosis, or edema Psych: appropriate Musc: B/L BKAs- both BKAs seen- has staples intact B/L- a piece of skin peeled off midline on L- dog ears done- very dry flaky skin noted around incisions. No drainage or erythema Neuro: Ox3- but doesn't appear to understand needing help here, will need help at home.  Motor:  Bilateral lower extremities: Hip flexion, knee extension 5/5, unchanged  Assessment/Plan: 1. Functional deficits secondary to B/L BKAs due to gangrene/osteomyelitis which require 3+ hours per day of interdisciplinary therapy in a comprehensive inpatient rehab setting.  Physiatrist is providing close team supervision and 24 hour  management of active medical problems listed below.  Physiatrist and rehab team continue to assess barriers to discharge/monitor patient progress toward functional and medical goals  Care Tool:  Bathing    Body parts bathed by patient: Right arm, Left arm, Chest, Abdomen, Face, Front perineal area, Right upper leg, Left upper leg   Body parts bathed by helper: Buttocks Body parts n/a: Right lower leg, Left lower leg   Bathing assist Assist Level: Minimal Assistance - Patient > 75%     Upper Body Dressing/Undressing Upper body dressing   What is the patient wearing?: Pull over shirt    Upper body assist Assist Level: Contact Guard/Touching assist    Lower Body Dressing/Undressing Lower body dressing      What is the patient wearing?: Underwear/pull up, Pants     Lower body assist Assist for lower body dressing: Moderate Assistance - Patient 50 - 74%     Toileting Toileting    Toileting assist Assist for toileting: Minimal Assistance - Patient > 75%     Transfers Chair/bed transfer  Transfers assist     Chair/bed transfer assist level: Minimal Assistance - Patient > 75%     Locomotion Ambulation   Ambulation assist   Ambulation activity did not occur: Safety/medical concerns          Walk 10 feet activity   Assist  Walk 10 feet activity did not occur: Safety/medical concerns  Walk 50 feet activity   Assist Walk 50 feet with 2 turns activity did not occur: Safety/medical concerns         Walk 150 feet activity   Assist Walk 150 feet activity did not occur: Safety/medical concerns         Walk 10 feet on uneven surface  activity   Assist Walk 10 feet on uneven surfaces activity did not occur: Safety/medical concerns         Wheelchair     Assist Will patient use wheelchair at discharge?: Yes Type of Wheelchair: Manual    Wheelchair assist level: Independent Max wheelchair distance: 120ft    Wheelchair 50  feet with 2 turns activity    Assist        Assist Level: Independent   Wheelchair 150 feet activity     Assist      Assist Level: Independent   Blood pressure (!) 121/52, pulse 83, temperature 98.6 F (37 C), temperature source Oral, resp. rate 18, height 4\' 8"  (1.422 m), weight 67.3 kg, SpO2 100 %.  Medical Problem List and Plan: 1.Impaired function/ADLs/mobilitysecondary to B/L BKAs due to osteomyelitis and gangrene- s/p IV ABX- completed 3/18  Continue CIR  2. Antithrombotics: -DVT/anticoagulation:Pharmaceutical:Heparindue to PD/ESRD -antiplatelet therapy:  3. Pain Management:Hydrocodone as needed- per pt, pain meds are suitable/working  4/4 increase gabapentin to 200mg  qhs for phantom limb pain  4/5- pt reports pain adequately managed 4. Mood: LCSW to follow for evaluation and support -antipsychotic agents:  5. Neuropsych: This patientiscapable of making decisions onhis own behalf. 6. Skin/Wound Care:   Will need to monitor incisions closely given recent fall  4/5- BKAs look OK considering 2 falls 7. Fluids/Electrolytes/Nutrition:Strict I's and O's. Monitor weights daily after PD. 8.  ESRD: Continue CCPD daily.Is oliguric, not anuric- voids ~2x/day 9.Acute on chronic anemia: On ESA weekly.   Hemoglobin 7.4 on 3/26, continue to monitor  3/31- Last Hb 7/8- will recheck tomorrow  4/1- Hb 7.5- stable  4/5- Hb 8.0- better but variable 10.Nutrition: Severe protein malnutrition-Renal/carb modified diet to continue with prostat due to low calorie/proteinmalnutrition. 11. GEX:BMWUXLK blood pressures twice daily. Off hydralazine and furosemide at this time.   Con't clonidine and Norvasc   Controlled on 4/4 12.T2DM with hyperglycemia: A1c 7.8-Continue Lantus daily--d/c meal coverage due to hypoglycemic episodes.Monitor blood sugars AC at bedtime for now due to hypoglycemia.   CBG (last 3)  Recent Labs     04/12/19 2154 04/13/19 0545 04/13/19 1133  GLUCAP 78 110* 140*   Lantus decreased to 15 units on 3/27 (already received a.m. dose)  Labile with hypoglycemia on 3/28.  Will need to monitor closely overnight.  Bedtime snack ordered for tonight.   3/30- DM coord asked me to increase Lantus to 18 units-   4/3: cbgs remain elevated, labile. Treating and highs and lows which causes rebound effect  4/4 Lantus 20u daily started today, increase mealtime covg to 5u  4/5- was low last night- didn't eat snack- was 78- will check with nursing  14. Leukocytosis: Resolved  3/30- WBC down to 8.9 from 13 yesterday 15. Hyperkalemia  3/30- up to 5.3- will see if renal will address since renal pt- if not, will try Lokelma.  3/31- Renal ordered Lokelma x1- wil recheck labs in AM  4/1- K+ down to 4.4- con't regimen 16. Constipation  3/31- will order Sorbitol 60 ml then follow in 3-4 hours with enema.   4/1- cleaned out- reminded pt to NOT refuse bowel meds  4/4 reiterating use/need of preventative bowel meds 17. LLQ Hematoma  4/2- will monitor hematoma- explained to try and avoid injecting into it with SQ Heparin- if not improving, will order imaging next week.  4/5- is getting smaller- was small plum, now large grape size.  18. [name]  4/5- pt seems to think can go home, but is min assist for transfers, not Mod I or even supervision- team is trying to address.    LOS: 13 days A FACE TO FACE EVALUATION WAS PERFORMED  Clovis Warwick 04/13/2019, 3:28 PM

## 2019-04-14 ENCOUNTER — Encounter (HOSPITAL_COMMUNITY): Payer: Medicare (Managed Care) | Admitting: Occupational Therapy

## 2019-04-14 ENCOUNTER — Ambulatory Visit: Payer: Medicare (Managed Care) | Admitting: Nurse Practitioner

## 2019-04-14 ENCOUNTER — Ambulatory Visit (HOSPITAL_COMMUNITY): Payer: Medicare (Managed Care)

## 2019-04-14 ENCOUNTER — Inpatient Hospital Stay (HOSPITAL_COMMUNITY): Payer: Medicare (Managed Care) | Admitting: Occupational Therapy

## 2019-04-14 LAB — GLUCOSE, CAPILLARY
Glucose-Capillary: 88 mg/dL (ref 70–99)
Glucose-Capillary: 83 mg/dL (ref 70–99)
Glucose-Capillary: 174 mg/dL — ABNORMAL HIGH (ref 70–99)
Glucose-Capillary: 110 mg/dL — ABNORMAL HIGH (ref 70–99)

## 2019-04-14 NOTE — Progress Notes (Signed)
Occupational Therapy Session Note  Patient Details  Name: Jorge [name] MRN: [id number] Date of Birth: [birth year]  Today's Date: 04/14/2019 OT Individual Time: 3300-7622 OT Individual Time Calculation (min): 73 min    Short Term Goals: Week 2:  OT Short Term Goal 1 (Week 2): Patient will complete SB transfers to/from drop arm commode, bed and w/c with CS OT Short Term Goal 2 (Week 2): patient will complete toileting at drop arm commode with CS/set up  Skilled Therapeutic Interventions/Progress Updates:    Patient in bed, states that pain is controlled.  Able to complete bed mobility CS, SB transfer bed to w/c with CS/set up.  He completes UB bathing/dressing and grooming w/c level mod I.  LB bathing/dressing bed level with set up.  W/c level HM reviewed and practiced - discussed management of hot food from microwave, reach into small refrigerator and placement of microwave for ease of use, access to sink and other accessibility options.    SB to mat table with CS.  Completed core strength and balance activities in SSP edge of mat with CS.  SB transfer back to w/c with CGA due to fatigue.  Reviewed recommendation to have someone with him for all transfers upon return home.  SB transfer back to bed with CGA where he remained at close of session with bed alarm set and call bell in reach.    Family education scheduled for this afternoon.  Patient states that he is ready for discharge tomorrow and will have assistance t/o the day.  Therapy Documentation Precautions:  Precautions Precautions: Fall Precaution Comments: wound VACs bilateral residual limbs, limb protectors Restrictions Weight Bearing Restrictions: Yes RLE Weight Bearing: Non weight bearing LLE Weight Bearing: Non weight bearing   Therapy/Group: Individual Therapy  Carlos Levering 04/14/2019, 7:36 AM

## 2019-04-14 NOTE — Progress Notes (Addendum)
Occupational Therapy Discharge Summary  Patient Details  Name: Jorge [name] MRN: [id number] Date of Birth: [birth year]   ### Patient's discharge plan has changed and occupational therapy services will continue q.d. to continue to work towards goals. Disregard d/c summary at this time. Thanks JLS   Patient has met 5 of 6 long term goals due to improved activity tolerance, improved balance, postural control and improved coordination.  Patient to discharge at overall Supervision/contact guard level.  Patient's care partner is independent to provide the necessary physical assistance at discharge.    Reasons goals not met: patient continues to require CGA/min a for functional transfers when fatigued  Recommendation:  Patient will benefit from ongoing skilled OT services in home health setting to continue to advance functional skills in the area of BADL and iADL.  Equipment: drop arm commode, transfer board  Reasons for discharge: treatment goals met  Patient/family agrees with progress made and goals achieved: Yes  OT Discharge Precautions/Restrictions  Precautions Precautions: Fall Precaution Comments: bliateral limb guards Restrictions Weight Bearing Restrictions: Yes RLE Weight Bearing: Non weight bearing LLE Weight Bearing: Non weight bearing   ADL ADL Eating: Independent Where Assessed-Eating: Bed level, Wheelchair Grooming: Modified independent Where Assessed-Grooming: Sitting at sink, Wheelchair Upper Body Bathing: Modified independent Where Assessed-Upper Body Bathing: Sitting at sink, Wheelchair Lower Body Bathing: Setup Where Assessed-Lower Body Bathing: Bed level Upper Body Dressing: Modified independent (Device) Where Assessed-Upper Body Dressing: Wheelchair Lower Body Dressing: Setup Where Assessed-Lower Body Dressing: Bed level Toileting: Minimal assistance Where Assessed-Toileting: Bedside Commode Toilet Transfer: Minimal assistance, Contact guard Toilet  Transfer Method: Theatre manager: Drop arm bedside commode ADL Comments: patient able to place bed pan and use urinal with set up if family not present to assist with transfer Vision Baseline Vision/History: Wears glasses Wears Glasses: Reading only Patient Visual Report: No change from baseline Vision Assessment?: No apparent visual deficits Perception  Perception: Within Functional Limits Praxis Praxis: Intact Cognition Overall Cognitive Status: Within Functional Limits for tasks assessed Arousal/Alertness: Awake/alert Orientation Level: Oriented X4 Memory: Appears intact Safety/Judgment: Appears intact Sensation Sensation Additional Comments: UB intact Coordination Fine Motor Movements are Fluid and Coordinated: Yes Motor  Motor Motor - Discharge Observations: general weakness Mobility  Bed Mobility Rolling Right: Independent Rolling Left: Independent Supine to Sit: Supervision/Verbal cueing  Trunk/Postural Assessment  Cervical Assessment Cervical Assessment: Within Functional Limits Thoracic Assessment Thoracic Assessment: Within Functional Limits Lumbar Assessment Lumbar Assessment: Within Functional Limits  Balance Static Sitting Balance Static Sitting - Level of Assistance: 6: Modified independent (Device/Increase time) Dynamic Sitting Balance Dynamic Sitting - Level of Assistance: 5: Stand by assistance Extremity/Trunk Assessment RUE Assessment RUE Assessment: Within Functional Limits LUE Assessment LUE Assessment: Within Functional Limits   Erline Levine A Kinter 04/14/2019, 12:15 PM

## 2019-04-14 NOTE — Patient Care Conference (Signed)
Inpatient RehabilitationTeam Conference and Plan of Care Update Date: 04/14/2019   Time: 11:30 AM    Patient Name: Jorge [name]      Medical Record Number: [id number]  Date of Birth: 1970-05-17 Sex: Male         Room/Bed: 4W08C/4W08C-01 Payor Info: Payor: GENERIC MEDICARE ADVANTAGE / Plan: GENERIC MEDICARE ADVANTAGE / Product Type: *No Product type* /    Admit Date/Time:  03/31/2019  4:18 PM  Primary Diagnosis:  S/P BKA (below knee amputation) bilateral [facility])  Patient Active Problem List   Diagnosis Date Noted  . Fall   . Hypoglycemia   . Labile blood glucose   . Anemia of chronic disease   . Acute blood loss anemia   . S/P BKA (below knee amputation) bilateral ([place]) 03/31/2019  . Below knee amputation ([place]) 03/31/2019  . Severe protein-calorie malnutrition ([place]) 03/20/2019  . ESRD on peritoneal dialysis ([place])   . ESRD (end stage renal disease) ([place]) 03/19/2019  . Hypertension   . Insulin-requiring or dependent type II diabetes mellitus ([place])   . Normocytic anemia     Expected Discharge Date: Expected Discharge Date: 04/15/19  Team Members Present: Physician leading conference: Dr. Courtney Heys Care Coodinator Present: Loralee Pacas, LCSWA;Christina Sampson Goon, [place] Nurse Present: Other (comment)(Nadia Kateri Mc, RN) PT Present: Apolinar Junes, PT OT Present: Elisabeth Most, OT SLP Present: Jettie Booze, CF-SLP PPS Coordinator present : Ileana Ladd, PT     Current Status/Progress Goal Weekly Team Focus  Bowel/Bladder   patient continent of bowel and bladder; oliguric; LBM 4/5  Patient to remain continent of B &B  assess q shift/prn   Swallow/Nutrition/ Hydration             ADL's   bed level LB bathing/dressing with set up, toileting on drop arm commode with min A, UB bathing and dressing w/c level with set up, functional transfers CGA  CS/set up  family education, d/c planning, adl/transfer training, UB conditioning   Mobility   Independent bed mobility,  slide board transfers bed<>w/c and w/c<>sedan with min assist-CGA with limb guards donned, can be CGA-supervision without limb guards, mod I w/c mobility x 150 ft, A/P transfer bed<>w/c min-max A  Independent bed mobility, supervision transfers, mod I household w/c mobility, supervision in community w/c mobility  W/c mobility, endurance, transfers, donning/doffing limb guards and shrinkers, w/c parts management, residual limb education; transfers to increase safety with transfers/independence without extra equipment/decrese fall risk, patient/caregiver education   Communication             Safety/Cognition/ Behavioral Observations            Pain   patient c/o R leg pain 5/10  pain < 2  assess q shift/prn and provide pain meds prn   Skin   bilateral BKA with staples and stump shrinkers; peritoneal catheter  Patient to remain free of further breakdown/infection  assess skin q shift/prn    Rehab Goals Patient on target to meet rehab goals: Yes *See Care Plan and progress notes for long and short-term goals.     Barriers to Discharge  Current Status/Progress Possible Resolutions Date Resolved   Nursing                  PT  Decreased caregiver support;Lack of/limited family support;Weight bearing restrictions  Patient and caregiver reporting differnt amount of assistance available at d/c, recommending patient alone no longer than 1-2 hours and no transfers without assist at d/c  Patient has has several near  falls during transfers with staff, will determine safe d/c status following family education on 4/6           OT                  SLP                SW Decreased caregiver support;Lack of/limited family support;Hemodialysis;Other (comments);Insurance for SNF coverage If will have limited support at d/c as his sister is a Catering manager and often gone for 3-4 days at a time; children only able to provide intermittent check-ins atleast once per day due to work/school (college students);  pt will need to transition to HD if SNF level of care is needed which will delay his discharge. Issues with insurance not being an [place] Medicare plan.            Discharge Planning/Teaching Needs:  D/c to home vs SNF pending family education  Family education as recommended by therapy; Fam edu session on 4/6 1pm-3pm with pt son and dtr   Team Discussion: MD incisions look good, pain controlled.  RN cont B/B, BM 4/5, on PD, lactose intolerance, has h/o DM.  OT LB B/D bed set up, UB B/D mod I, w/c level, min A tolileting, transfers S/CGA, fam ed today.  PT SB transfers, goals CGA transfers, fam ed today, bed I, w/c mod I, don shrinker I, fatigue.  Sister can provide some S at DC.  Revisions to Treatment Plan: N/A     Medical Summary Current Status: continent B/B LBM yesterday- does PD- in room; BGs doing well Weekly Focus/Goal: OT- LB bed level S/U; UB mod I w/c level; if uses drop arm commode min assist- can't clear hips easily. If by self, will need to use bedpan. close SBA to CGA for transfers  Barriers to Discharge: Decreased family/caregiver support;Behavior;Home enviroment access/layout;Hemodialysis;Weight;Wound care;Other (comments)  Barriers to Discharge Comments: WBC 11k- will recheck in AM- d/c tomorrow- Wednesday 4/7 Possible Resolutions to Barriers: PT- focus on transfers- can't clear hips so needs SB- solid min assist with limb guards on;   Continued Need for Acute Rehabilitation Level of Care: The patient requires daily medical management by a physician with specialized training in physical medicine and rehabilitation for the following reasons: Direction of a multidisciplinary physical rehabilitation program to maximize functional independence : Yes Medical management of patient stability for increased activity during participation in an intensive rehabilitation regime.: Yes Analysis of laboratory values and/or radiology reports with any subsequent need for medication adjustment and/or  medical intervention. : Yes   I attest that I was present, lead the team conference, and concur with the assessment and plan of the team.   Retta Diones 04/14/2019, 4:43 PM   Team conference was held via web/ teleconference due to [place] - 19

## 2019-04-14 NOTE — Progress Notes (Signed)
[facility] Progress Note   Subjective:  No issues with PD overnight.  Net UF 1L. Has concerns about diet.   Denies shortness of breath  Denies chest pain  Denies n/v   Objective Vitals:   04/13/19 1703 04/13/19 2026 04/14/19 0609 04/14/19 0750  BP: (!) 108/54 (!) 109/55 (!) 105/54 (!) 110/56  Pulse: 74 73 77 77  Resp: 16  16 16   Temp: 98.1 F (36.7 C) 98 F (36.7 C) 98.4 F (36.9 C) 98.6 F (37 C)  TempSrc: Oral   Oral  SpO2: 98% 98% 100% 98%  Weight: 68.3 kg   68.9 kg  Height:        Weight change: -2.4 kg   Additional Objective Labs: Basic Metabolic Panel: Recent Labs  Lab 04/09/19 0541 04/10/19 0554 04/13/19 0537  NA 135 133* 132*  K 4.4 4.6 4.3  CL 95* 93* 94*  CO2 18* 23 21*  GLUCOSE 111* 189* 126*  BUN 107* 101* 106*  CREATININE 13.00* 12.79* 12.26*  CALCIUM 8.8* 8.6* 8.4*  PHOS 5.9* 5.9* 5.5*   CBC: Recent Labs  Lab 04/09/19 0541 04/13/19 0537  WBC 8.5 11.0*  NEUTROABS 5.0  --   HGB 7.5* 8.0*  HCT 24.7* 25.9*  MCV 98.4 96.6  PLT 248 222   Blood Culture    Component Value Date/Time   SDES BLOOD SITE NOT SPECIFIED 03/19/2019 2016   SPECREQUEST  03/19/2019 2016    BOTTLES DRAWN AEROBIC AND ANAEROBIC Blood Culture adequate volume   CULT  03/19/2019 2016    NO GROWTH 5 DAYS Performed at [facility], [place] [address], [place], [place] [address]    REPTSTATUS 03/24/2019 FINAL 03/19/2019 2016     Physical Exam General:  Well appearing,lying in bed, nad  Heart: RRR No m,r,g Lungs: clear bilaterally  Abdomen: soft non-tender; non-distended  Extremities: bilateral BKA; no stump edema  Dialysis Access: PD cath intact   Medications: . dialysis solution 1.5% low-MG/low-CA     . amLODipine  10 mg Oral Daily  . aspirin EC  81 mg Oral Daily  . atorvastatin  20 mg Oral q1800  . calcitRIOL  0.5 mcg Oral Once per day on Mon Wed Fri  . cloNIDine  0.1 mg Oral QHS  . darbepoetin (ARANESP) injection - NON-DIALYSIS  200 mcg  Subcutaneous Q Thu-1800  . docusate sodium  100 mg Oral BID  . feeding supplement (PRO-STAT SUGAR FREE 64)  30 mL Oral TID BM  . gabapentin  200 mg Oral QHS  . gentamicin cream  1 application Topical Daily  . heparin  5,000 Units Subcutaneous Q8H  . insulin aspart  0-5 Units Subcutaneous QHS  . insulin aspart  0-6 Units Subcutaneous TID WC  . insulin aspart  5 Units Subcutaneous TID WC  . insulin glargine  20 Units Subcutaneous Daily  . multivitamin  1 tablet Oral QHS  . senna  1 tablet Oral BID  . sevelamer carbonate  3,200 mg Oral TID WC    Dialysis Orders: CCPD 7 Days weekly 80 kg 4 exchanges 1.6 mls Dwell time 1.5 hr, No pause or day dwells. Uses mostly 2.5% solution  BMD meds: -Renvela 800 mg 3 tabs PO TID with meals -Calcitriol 0.5 mcg PO TIW (MWF) -   Assessment/Plan: 1. 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 In CIR.  2. DM/Hyperglycemia: Per primary  3. ESRD - Azotemic, suspected from protein intake and marginal PD Rx.  Increased  vol to 2.3L on 3/27, kept 4 exchanges.  Later increased dwell time to 2 hrs.  Might need a last bag fill with midday drain as an outpatient - does not appear to be an inpatient option per dialysis unit.  Constipation now resolved.  1. Continue PD - All 1.5%  2. Appreciate optimizing bowels as constipation earlier may have contributed to poor clearance.  4. Hypertension/volume - Amlodipine startedhere on3/13 Significantly under edw, down to 67.7kgon 4/1. Does not appear volume overloaded.  Will need new edw at d/c. Appears on nightly clonidine as outpt as well.  5. Anemia - HGB initially 7.7 fell to 5.4on3/12 and transfused two units PRBCs.  Heme neg 3/11 Ferritin still increased post surgery to 1420 with drop in tsat to 15% - s/p  a short course of IV Fe (250 x 2) on Aranesp 200/ week - given qThurs. Transfuse as indicated. CTM.  Note FOBT was checked and positive - per primary team. Pt states has hemorrhoids  and no overt blood or dark stools per rectum. Hgb 7.5>8.0  6. Metabolic bone disease -phos improving on current regimen 7. Nutrition - Albumin low prostat, renal vits.-Renal/carb modified diet. Lactose intolerance noted.    Jorge Child PA-C [facility] Pager [phone] 04/14/2019,11:45 AM

## 2019-04-14 NOTE — Progress Notes (Signed)
Physical Therapy Session Note  Patient Details  Name: Jorge [name] MRN: [id number] Date of Birth: 1970/04/01  Today's Date: 04/14/2019 PT Individual Time: 1300-1400 PT Individual Time Calculation (min): 60 min   Short Term Goals: Week 2:  PT Short Term Goal 1 (Week 2): STG = LTG due to short ELOS.  Skilled Therapeutic Interventions/Progress Updates:     Patient in bed with his daughter present for family education upon PT arrival. Patient alert and agreeable to PT session. Patient denied pain during session. Patient's son unable to attend family education session due to a work conflict. Patient donned/doffed B shrinkers independently. Provided education about purpose and hygiene care for shrinkers. Patient demonstrated use of handheld mirror to assess incision sites and utilized teach back method to recall signs and symptoms of infection and pressure injury and to perform limb checks daily. Instructed patient to take daily pictures to compare changes in limbs from day to day.  Confirmed with the patient with his daughter present that he would only be home alone 1-2 hours at a time. Patient reported that he had a friend that would come by when family was unable to assist him. Patient's daughter agreeable to teach all caregivers proper assist technique and education learned during this session.   Therapeutic Activity: Bed Mobility: Patient performed rolling R/L and supine to/from sit with independently in a flat bed without rails with increased time and trials due to abdominal muscle fatigue from previous session.  Transfers: Patient performed slide board transfers bed>w/c with PT assist and w/c>bed with his daughter assisting with CGA and supervision for board placement. Provided education with assist from patient for hand placement, board placement, and head-hips relationship for proper technique and decreased assist with transfers to patient's daughter. Patient performed a simulated sedan  height car transfer with CGA-min A using a slide board with PT demonstrating assist technique while entering the car and his daughter providing assist when exiting the car.  Patient performed w/c set up for all transfers without cues and board placement with 1 cue for placing the board toward the back of the seat for safety. Educated patient's daughter about w/c set up and board placement to provide additional awareness during transfers for safety.   Wheelchair Mobility:  Patient was transported in the w/c with total A throughout session for energy conservation and time management. He propelled the w/c up/down a ramp x2 to simulate community access with CGA and min A for initiation. Provided cues for forward trunk lean while ascending, and squeezing wheels for breaking while descending.   Patient donned B limb guards independently when in the w/c and doffed limb guards for all transfers independently for improved balance with transfers.  Patient able to recall HEP frequency, pressure relief technique with demonstration and schedule (5 lateral leans for 5-10 sec each every 30 min when up in the chair), and lying prone x10-15 min/day for hip extensor flexibility.   Per discussion with the patient and his daughter, plan to have PT assist patient into the car tomorrow afternoon at d/c with patient's sister and son present for brief family education training. Daughter to demonstrate transfers to patient's sister tonight.   Patient in bed with his daughter in the room when handed off to [place] for continued family education. Patient's daughter cleared for transfers in the room during session.    Therapy Documentation Precautions:  Precautions Precautions: Fall Precaution Comments: bliateral limb guards Restrictions Weight Bearing Restrictions: Yes RLE Weight Bearing: Non weight bearing  LLE Weight Bearing: Non weight bearing    Therapy/Group: Individual Therapy  Franki Alcaide L Brileigh Sevcik PT,  DPT  04/14/2019, 6:35 PM

## 2019-04-14 NOTE — Progress Notes (Signed)
[facility] PHYSICAL MEDICINE & REHABILITATION PROGRESS NOTE   Subjective/Complaints:  Pt reports feeling good- no signs of resp/abd /UTI infection.  Legs doing better since last fall- is emphatic someone will be home with him when he's transferring. Says they are trying to work it out with others-friends, etc.  Wants lactose free milk. But because has high phos, can't get it for him. Shouldn't have ANY milk.     ROS:   Pt denies SOB, abd pain, CP, N/V/C/D, and vision changes   Objective:   No results found. Recent Labs    04/13/19 0537  WBC 11.0*  HGB 8.0*  HCT 25.9*  PLT 222   Recent Labs    04/13/19 0537  NA 132*  K 4.3  CL 94*  CO2 21*  GLUCOSE 126*  BUN 106*  CREATININE 12.26*  CALCIUM 8.4*    Intake/Output Summary (Last 24 hours) at 04/14/2019 0925 Last data filed at 04/14/2019 0656 Gross per 24 hour  Intake 472 ml  Output 250 ml  Net 222 ml     Physical Exam: Vital Signs Blood pressure (!) 110/56, pulse 77, temperature 98.6 F (37 C), temperature source Oral, resp. rate 16, height 4\' 8"  (1.422 m), weight 68.9 kg, SpO2 98 %.  Constitutional:sitting up in bed; reading with readers on, NAD HEENT:conjuagte gaze Neck: supple Cardiovascular: RRR- no JVD  Respiratory/Chest: CTA B/L- good air movement GI/Abdomen: soft, NT, ND, (+)BS- small knot under skin on LLQ- stable Ext: no clubbing, cyanosis, or edema Psych: appropriate Musc: B/L BKAs- assessed R BKA this MA_ looks great- some dry flaky skin, but no erythema or drainage seen; (+) staples in tact Neuro: Ox3- but doesn't appear to understand needing help here, will need help at home.  Motor:  Bilateral lower extremities: Hip flexion, knee extension 5/5, unchanged  Assessment/Plan: 1. Functional deficits secondary to B/L BKAs due to gangrene/osteomyelitis which require 3+ hours per day of interdisciplinary therapy in a comprehensive inpatient rehab setting.  Physiatrist is providing close team  supervision and 24 hour management of active medical problems listed below.  Physiatrist and rehab team continue to assess barriers to discharge/monitor patient progress toward functional and medical goals  Care Tool:  Bathing    Body parts bathed by patient: Right arm, Left arm, Chest, Abdomen, Face, Front perineal area, Right upper leg, Left upper leg   Body parts bathed by helper: Buttocks Body parts n/a: Right lower leg, Left lower leg   Bathing assist Assist Level: Minimal Assistance - Patient > 75%     Upper Body Dressing/Undressing Upper body dressing   What is the patient wearing?: Pull over shirt    Upper body assist Assist Level: Contact Guard/Touching assist    Lower Body Dressing/Undressing Lower body dressing      What is the patient wearing?: Underwear/pull up, Pants     Lower body assist Assist for lower body dressing: Moderate Assistance - Patient 50 - 74%     Toileting Toileting    Toileting assist Assist for toileting: Minimal Assistance - Patient > 75%     Transfers Chair/bed transfer  Transfers assist     Chair/bed transfer assist level: Minimal Assistance - Patient > 75%     Locomotion Ambulation   Ambulation assist   Ambulation activity did not occur: Safety/medical concerns          Walk 10 feet activity   Assist  Walk 10 feet activity did not occur: Safety/medical concerns  Walk 50 feet activity   Assist Walk 50 feet with 2 turns activity did not occur: Safety/medical concerns         Walk 150 feet activity   Assist Walk 150 feet activity did not occur: Safety/medical concerns         Walk 10 feet on uneven surface  activity   Assist Walk 10 feet on uneven surfaces activity did not occur: Safety/medical concerns         Wheelchair     Assist Will patient use wheelchair at discharge?: Yes Type of Wheelchair: Manual    Wheelchair assist level: Independent Max wheelchair distance:  148ft    Wheelchair 50 feet with 2 turns activity    Assist        Assist Level: Independent   Wheelchair 150 feet activity     Assist      Assist Level: Independent   Blood pressure (!) 110/56, pulse 77, temperature 98.6 F (37 C), temperature source Oral, resp. rate 16, height 4\' 8"  (1.422 m), weight 68.9 kg, SpO2 98 %.  Medical Problem List and Plan: 1.Impaired function/ADLs/mobilitysecondary to B/L BKAs due to osteomyelitis and gangrene- s/p IV ABX- completed 3/18  Continue CIR  2. Antithrombotics: -DVT/anticoagulation:Pharmaceutical:Heparindue to PD/ESRD -antiplatelet therapy:  3. Pain Management:Hydrocodone as needed- per pt, pain meds are suitable/working  4/4 increase gabapentin to 200mg  qhs for phantom limb pain  4/5- pt reports pain adequately managed 4. Mood: LCSW to follow for evaluation and support -antipsychotic agents:  5. Neuropsych: This patientiscapable of making decisions onhis own behalf. 6. Skin/Wound Care:   Will need to monitor incisions closely given recent fall  4/5- BKAs look OK considering 2 falls  4/6- skin looking good- plans on wearing shrinkers today (didn't yesterday) 7. Fluids/Electrolytes/Nutrition:Strict I's and O's. Monitor weights daily after PD. 8.  ESRD: Continue CCPD daily.Is oliguric, not anuric- voids ~2x/day 9.Acute on chronic anemia: On ESA weekly.   Hemoglobin 7.4 on 3/26, continue to monitor  3/31- Last Hb 7/8- will recheck tomorrow  4/1- Hb 7.5- stable  4/5- Hb 8.0- better but variable 10.Nutrition: Severe protein malnutrition-Renal/carb modified diet to continue with prostat due to low calorie/proteinmalnutrition. 11. GQQ:PYPPJKD blood pressures twice daily. Off hydralazine and furosemide at this time.   Con't clonidine and Norvasc   4/6- BP controlled- con't meds 12.T2DM with hyperglycemia: A1c 7.8-Continue Lantus daily--d/c meal coverage due to hypoglycemic  episodes.Monitor blood sugars AC at bedtime for now due to hypoglycemia.   CBG (last 3)  Recent Labs    04/13/19 1657 04/13/19 2129 04/14/19 0618  GLUCAP 110* 171* 88   Lantus decreased to 15 units on 3/27 (already received a.m. dose)  Labile with hypoglycemia on 3/28.  Will need to monitor closely overnight.  Bedtime snack ordered for tonight.   3/30- DM coord asked me to increase Lantus to 18 units-   4/3: cbgs remain elevated, labile. Treating and highs and lows which causes rebound effect  4/4 Lantus 20u daily started today, increase mealtime covg to 5u  4/5- was low last night- didn't eat snack- was 78- will check with nursing  4/6- BGs doing OK today- had night snaxk 14. Leukocytosis: Resolved  3/30- WBC down to 8.9 from 13 yesterday 15. Hyperkalemia  3/30- up to 5.3- will see if renal will address since renal pt- if not, will try Lokelma.  3/31- Renal ordered Lokelma x1- wil recheck labs in AM  4/1- K+ down to 4.4- con't regimen 16. Constipation  3/31- will order  Sorbitol 60 ml then follow in 3-4 hours with enema.   4/1- cleaned out- reminded pt to NOT refuse bowel meds  4/4 reiterating use/need of preventative bowel meds 17. LLQ Hematoma  4/2- will monitor hematoma- explained to try and avoid injecting into it with SQ Heparin- if not improving, will order imaging next week.  4/5- is getting smaller- was small plum, now large grape size.  18. [name]  4/5- pt seems to think can go home, but is min assist for transfers, not Mod I or even supervision- team is trying to address.  4/6- pt emphatic that will have family at home- - won't entertain any other ideas.    LOS: 14 days A FACE TO FACE EVALUATION WAS PERFORMED  Jozsef Wescoat 04/14/2019, 9:25 AM

## 2019-04-14 NOTE — Progress Notes (Signed)
Physical Therapy Discharge Summary  Patient Details  Name: Jorge [name] MRN: [id number] Date of Birth: 1970/08/08     Patient has met 8 of 8 long term goals due to improved activity tolerance, improved balance, improved postural control, increased strength, increased range of motion, decreased pain and ability to compensate for deficits.  Patient to discharge at a wheelchair level supervision- [place].    All goals met.   Recommendation:  Patient will benefit from ongoing skilled PT services in skilled nursing facility setting to continue to advance safe functional mobility, address ongoing impairments in balance, strength, functional mobility, amputee education, and minimize fall risk.  Equipment: none  Reasons for discharge: treatment goals met  Patient/family agrees with progress made and goals achieved: Yes  PT Discharge Precautions/Restrictions Precautions Precautions: Fall Precaution Comments: bliateral limb guards Restrictions Weight Bearing Restrictions: Yes RLE Weight Bearing: Non weight bearing LLE Weight Bearing: Non weight bearing Cognition Overall Cognitive Status: Within Functional Limits for tasks assessed Arousal/Alertness: Awake/alert Orientation Level: Oriented X4 Attention: Focused;Sustained Focused Attention: Appears intact Sustained Attention: Appears intact Memory: Appears intact Problem Solving: Appears intact Safety/Judgment: Appears intact Sensation Sensation Light Touch: Appears Intact Proprioception: Appears Intact Coordination Gross Motor Movements are Fluid and Coordinated: No Fine Motor Movements are Fluid and Coordinated: Yes Coordination and Movement Description: New B BKA with generalized weakness and decreased activity tolerance Motor  Motor Motor: Abnormal postural alignment and control Motor - Discharge Observations: general weakness  Mobility Bed Mobility Bed Mobility: Rolling Right;Rolling Left;Supine to Sit;Sit to  Supine Rolling Right: Independent Rolling Left: Independent Supine to Sit: Independent Sit to Supine: Independent Transfers Transfers: Lateral/Scoot Transfers Lateral/Scoot Transfers: Supervision/Verbal cueing Transfer (Assistive device): (slideboard) Locomotion  Gait Ambulation: No Gait Gait: No Stairs / Additional Locomotion Stairs: No Architect: Yes Wheelchair Assistance: Independent with Camera operator: Both upper extremities Wheelchair Parts Management: Independent Distance: 200 ft  Trunk/Postural Assessment  Cervical Assessment Cervical Assessment: Within Functional Limits Thoracic Assessment Thoracic Assessment: Within Functional Limits Lumbar Assessment Lumbar Assessment: Within Functional Limits Postural Control Postural Control: Deficits on evaluation  Balance Static Sitting Balance Static Sitting - Balance Support: Feet unsupported;Bilateral upper extremity supported Static Sitting - Level of Assistance: 7: Independent Dynamic Sitting Balance Dynamic Sitting - Balance Support: Bilateral upper extremity supported;Feet unsupported Dynamic Sitting - Level of Assistance: 5: Stand by assistance Extremity Assessment  RLE Assessment RLE Assessment: Exceptions to [facility] Passive Range of Motion (PROM) Comments: WFL Active Range of Motion (AROM) Comments: Grossly 110-120 degrees knee flexion and lacking ~5 degrees knee extension General Strength Comments: Grossly 5/5 throughout except 4/5 hamstring strength due to pain LLE Assessment LLE Assessment: Exceptions to [facility] Passive Range of Motion (PROM) Comments: WFL Active Range of Motion (AROM) Comments: Grossly 110-120 degrees knee flexion and lacking ~5 degrees knee extension General Strength Comments: Grossly 5/5 throughout except 4+/5 hamstring strength    Netta Corrigan PT, DPT  04/21/2019, 3:55 PM

## 2019-04-14 NOTE — Progress Notes (Signed)
Social Work Patient ID: Jorge [name], male   DOB: 1970-12-17, 49 y.o.   MRN: [id number]   Pt dtr present for family education today. No questions/concerns reported. SW to follow-up with pt later to discuss HHA preference. DME order: w/c and DABSC sent to [facility] via parachute.   SW later received phone call from pt sister Jorge [name] and his brother Jorge [name] reporting concerns related to pt d/c to home. SW provided updates from rehab team informing pt did not require SNF level of care and with support at home he would be functional since pt does not require 24/7 care. Jorge [name] indicated that her home was not a safe d/c to return since he would not have adequate care. SW reiterated this discussion would need to be had with pt, and he will have to be agreeable to placement. SW met with pt in room, pt sister Jorge [name] (conference call), pt dtr, and PT to discuss d/c plan. Plan of care is now SNF. Pt understands that will now switch from PD to HD. SW informed medical team on change in d/c plan.   Loralee Pacas, MSW, [place] Office: [phone] Cell: [phone] Fax: [phone]

## 2019-04-14 NOTE — Progress Notes (Signed)
Occupational Therapy Session Note  Patient Details  Name: Jorge [name] MRN: [id number] Date of Birth: 1970-08-14  Today's Date: 04/14/2019 OT Individual Time: 1400-1500 OT Individual Time Calculation (min): 60 min    Short Term Goals: Week 2:  OT Short Term Goal 1 (Week 2): Patient will complete SB transfers to/from drop arm commode, bed and w/c with CS OT Short Term Goal 2 (Week 2): patient will complete toileting at drop arm commode with CS/set up  Skilled Therapeutic Interventions/Progress Updates:    patient in bed, just completed family education session with PT and ready with daughter, Jorge [name], to complete OT family education session.  Reviewed completion of ADL tasks and recommendations for positioning with and without supervision/assistance to promote independence and maintain safety.  Reviewed and practiced SB transfer to/from bed and drop arm commode.  Daughter assisted for 3 transfers and demonstrated good carryover and awareness of safety needs.  Reviewed and practiced reach and transport of items for basic meal prep at a w/c level.  Patient will have small refrigerator and microwave accessible post discharge.  Daughter assisted patient back to bed at close of session with good technique.  They both state that they feel prepared for discharge tomorrow.  Bed alarm set and call bell in reach at close of session.    Therapy Documentation Precautions:  Precautions Precautions: Fall Precaution Comments: bliateral limb guards Restrictions Weight Bearing Restrictions: Yes RLE Weight Bearing: Non weight bearing LLE Weight Bearing: Non weight bearing General:   Vital Signs: Therapy Vitals Temp: 98.2 F (36.8 C) Pulse Rate: 74 Resp: 18 BP: (!) 101/48 Patient Position (if appropriate): Sitting Oxygen Therapy SpO2: 100 % O2 Device: Room Air Pain: Pain Assessment Pain Scale: 0-10 Pain Score: 0-No pain   Therapy/Group: Individual Therapy  Carlos Levering 04/14/2019, 3:53  PM

## 2019-04-15 LAB — RENAL FUNCTION PANEL
Albumin: 1.6 g/dL — ABNORMAL LOW (ref 3.5–5.0)
Anion gap: 18 — ABNORMAL HIGH (ref 5–15)
BUN: 108 mg/dL — ABNORMAL HIGH (ref 6–20)
CO2: 21 mmol/L — ABNORMAL LOW (ref 22–32)
Calcium: 8.3 mg/dL — ABNORMAL LOW (ref 8.9–10.3)
Chloride: 92 mmol/L — ABNORMAL LOW (ref 98–111)
Creatinine, Ser: 11.99 mg/dL — ABNORMAL HIGH (ref 0.61–1.24)
GFR calc Af Amer: 5 mL/min — ABNORMAL LOW (ref >60)
GFR calc non Af Amer: 4 mL/min — ABNORMAL LOW (ref >60)
Glucose, Bld: 118 mg/dL — ABNORMAL HIGH (ref 70–99)
Phosphorus: 6.3 mg/dL — ABNORMAL HIGH (ref 2.5–4.6)
Potassium: 4.4 mmol/L (ref 3.5–5.1)
Sodium: 131 mmol/L — ABNORMAL LOW (ref 135–145)

## 2019-04-15 LAB — CBC WITH DIFFERENTIAL/PLATELET
Abs Immature Granulocytes: 0.04 10*3/uL (ref 0.00–0.07)
Basophils Absolute: 0.1 10*3/uL (ref 0.0–0.1)
Basophils Relative: 1 %
Eosinophils Absolute: 0.5 10*3/uL (ref 0.0–0.5)
Eosinophils Relative: 6 %
HCT: 25.6 % — ABNORMAL LOW (ref 39.0–52.0)
Hemoglobin: 7.9 g/dL — ABNORMAL LOW (ref 13.0–17.0)
Immature Granulocytes: 1 %
Lymphocytes Relative: 17 %
Lymphs Abs: 1.4 10*3/uL (ref 0.7–4.0)
MCH: 29.7 pg (ref 26.0–34.0)
MCHC: 30.9 g/dL (ref 30.0–36.0)
MCV: 96.2 fL (ref 80.0–100.0)
Monocytes Absolute: 1.4 10*3/uL — ABNORMAL HIGH (ref 0.1–1.0)
Monocytes Relative: 17 %
Neutro Abs: 4.8 10*3/uL (ref 1.7–7.7)
Neutrophils Relative %: 58 %
Platelets: 210 10*3/uL (ref 150–400)
RBC: 2.66 MIL/uL — ABNORMAL LOW (ref 4.22–5.81)
RDW: 14.9 % (ref 11.5–15.5)
WBC: 8.3 10*3/uL (ref 4.0–10.5)
nRBC: 0 % (ref 0.0–0.2)

## 2019-04-15 LAB — GLUCOSE, CAPILLARY
Glucose-Capillary: 97 mg/dL (ref 70–99)
Glucose-Capillary: 121 mg/dL — ABNORMAL HIGH (ref 70–99)
Glucose-Capillary: 200 mg/dL — ABNORMAL HIGH (ref 70–99)
Glucose-Capillary: 119 mg/dL — ABNORMAL HIGH (ref 70–99)

## 2019-04-15 MED ORDER — CEFAZOLIN SODIUM-DEXTROSE 2-4 GM/100ML-% IV SOLN
2.0000 g | INTRAVENOUS | Status: AC
  Administered 2019-04-16: 2 g via INTRAVENOUS
  Filled 2019-04-15: qty 100

## 2019-04-15 NOTE — Progress Notes (Signed)
[facility] Progress Note   Subjective:  Seen in room.  No issues with PD overnight. Will need to transition to HD for discharge to SNF. Needs access for HD. He understands and is agreeable.   Denies shortness of breath  Denies chest pain  Denies n/v   Objective Vitals:   04/14/19 1727 04/14/19 1950 04/15/19 0500 04/15/19 0715  BP: (!) 106/46 (!) 102/51 (!) 122/57 122/64  Pulse: 74 74 72 72  Resp: 18  18 18   Temp: 98.4 F (36.9 C) 99.1 F (37.3 C) 98.4 F (36.9 C) 98.6 F (37 C)  TempSrc: Oral Oral Oral Oral  SpO2:  97% 99% 98%  Weight:    68.6 kg  Height:        Weight change: 1.6 kg   Additional Objective Labs: Basic Metabolic Panel: Recent Labs  Lab 04/10/19 0554 04/13/19 0537 04/15/19 0459  NA 133* 132* 131*  K 4.6 4.3 4.4  CL 93* 94* 92*  CO2 23 21* 21*  GLUCOSE 189* 126* 118*  BUN 101* 106* 108*  CREATININE 12.79* 12.26* 11.99*  CALCIUM 8.6* 8.4* 8.3*  PHOS 5.9* 5.5* 6.3*   CBC: Recent Labs  Lab 04/09/19 0541 04/13/19 0537 04/15/19 0459  WBC 8.5 11.0* 8.3  NEUTROABS 5.0  --  4.8  HGB 7.5* 8.0* 7.9*  HCT 24.7* 25.9* 25.6*  MCV 98.4 96.6 96.2  PLT 248 222 210   Blood Culture    Component Value Date/Time   SDES BLOOD SITE NOT SPECIFIED 03/19/2019 2016   SPECREQUEST  03/19/2019 2016    BOTTLES DRAWN AEROBIC AND ANAEROBIC Blood Culture adequate volume   CULT  03/19/2019 2016    NO GROWTH 5 DAYS Performed at [facility], [place] [address] [place],  [address]    REPTSTATUS 03/24/2019 FINAL 03/19/2019 2016     Physical Exam General:  Well appearing,lying in bed, nad  Heart: RRR No m,r,g Lungs: clear bilaterally  Abdomen: soft non-tender; non-distended  Extremities: bilateral BKA; no stump edema  Dialysis Access: PD cath intact   Medications: . [START ON 04/16/2019]  ceFAZolin (ANCEF) IV    . dialysis solution 1.5% low-MG/low-CA     . amLODipine  10 mg Oral Daily  . aspirin EC  81 mg Oral Daily  .  atorvastatin  20 mg Oral q1800  . calcitRIOL  0.5 mcg Oral Once per day on Mon Wed Fri  . cloNIDine  0.1 mg Oral QHS  . darbepoetin (ARANESP) injection - NON-DIALYSIS  200 mcg Subcutaneous Q Thu-1800  . docusate sodium  100 mg Oral BID  . feeding supplement (PRO-STAT SUGAR FREE 64)  30 mL Oral TID BM  . gabapentin  200 mg Oral QHS  . gentamicin cream  1 application Topical Daily  . heparin  5,000 Units Subcutaneous Q8H  . insulin aspart  0-5 Units Subcutaneous QHS  . insulin aspart  0-6 Units Subcutaneous TID WC  . insulin aspart  5 Units Subcutaneous TID WC  . insulin glargine  20 Units Subcutaneous Daily  . multivitamin  1 tablet Oral QHS  . senna  1 tablet Oral BID  . sevelamer carbonate  3,200 mg Oral TID WC    Dialysis Orders: CCPD 7 Days weekly 80 kg 4 exchanges 1.6 mls Dwell time 1.5 hr, No pause or day dwells. Uses mostly 2.5% solution  BMD meds: -Renvela 800 mg 3 tabs PO TID with meals -Calcitriol 0.5 mcg PO TIW (MWF) -   Assessment/Plan: 1. Gangrene/  bilateral Osteo feet with severe PVD-s/p bilateral BKA  3/17. In CIR. Will d/c to SNF. 2. ESRD - Azotemic, suspected from protein intake and marginal PD Rx.  Increased vol to 2.3L on 3/27, kept 4 exchanges.  Later increased dwell time to 2 hrs.  Might need a last bag fill with midday drain as an outpatient   Constipation now resolved.  1. Using all 1.5% on PD.  2. Will need to transition to HD temporarily for discharge to SNF. IR consult for [facility] on 4/8. Renal coordinator notified for CLIP to outpatient center.  3. Hypertension/volume -  BP controlled. Amlodipine startedhere on3/13 Significantly under edw.  Does not appear volume overloaded.  Will need new edw at d/c. Appears on nightly clonidine as outpt as well.  4. Anemia - HGB initially 7.7 fell to 5.4on3/12 and transfused two units PRBCs.  Heme neg 3/11 Ferritin still increased post surgery to 1420 with drop in tsat to 15% - s/p  a short course of IV Fe (250 x 2) on  Aranesp 200/ week - given qThurs. Transfuse as indicated. CTM.  Note FOBT was checked and positive - per primary team. Pt states has hemorrhoids and no overt blood or dark stools per rectum. Hgb 7.5>8.0  5. Metabolic bone disease -phos improving on current regimen 6. DM - per PMD 7. Nutrition - Albumin low prostat, renal vits.-Renal/carb modified diet. Lactose intolerance noted.    Lynnda Child PA-C [facility] Pager [phone] 04/15/2019,11:32 AM

## 2019-04-15 NOTE — Progress Notes (Signed)
Occupational Therapy Session Note  Patient Details  Name: Jorge [name] MRN: [id number] Date of Birth: 17-Mar-1970  Today's Date: 04/15/2019 OT Individual Time: 1445-1520 OT Individual Time Calculation (min): 35 min    Short Term Goals: Week 1:  OT Short Term Goal 1 (Week 1): Pt will transfer wiht CGA to BSC/toilet OT Short Term Goal 1 - Progress (Week 1): Met OT Short Term Goal 2 (Week 1): Pt will don pants wiht CGA using lateral leans OT Short Term Goal 2 - Progress (Week 1): Met OT Short Term Goal 3 (Week 1): Pt will manage pants prior to toileting wiht CGA OT Short Term Goal 3 - Progress (Week 1): Met  Skilled Therapeutic Interventions/Progress Updates:    1:1. Pt received in bed agreeable to OT requesting to go outside. Pt completes lateral scoot transfers with set up to bring over w/c/lock brakes prior to OOB transfer. Pt completes part way w/c mobility to outside courtyard. Pt discusses disappointment of loss of preferred activity- sitting out on beach. Educated pt on beach w/c options and connecting with local community resources to see if one is available to rent. Pt very excited about this and wants to call brother to seek out options for when he is visiting. Exited session wih tp tseate din bed, exit alarm on and call lightin reach  Therapy Documentation Precautions:  Precautions Precautions: Fall Precaution Comments: bliateral limb guards Restrictions Weight Bearing Restrictions: Yes RLE Weight Bearing: Non weight bearing LLE Weight Bearing: Non weight bearing General:   Vital Signs: Therapy Vitals Temp: (!) 97.5 F (36.4 C) Temp Source: Oral Pulse Rate: 84 Resp: 18 BP: (!) 101/51 Patient Position (if appropriate): Lying Oxygen Therapy SpO2: 96 % O2 Device: Room Air Pain:   ADL: ADL Eating: Independent Where Assessed-Eating: Bed level, Wheelchair Grooming: Modified independent Where Assessed-Grooming: Sitting at sink, Wheelchair Upper Body Bathing:  Modified independent Where Assessed-Upper Body Bathing: Sitting at sink, Wheelchair Lower Body Bathing: Setup Where Assessed-Lower Body Bathing: Bed level Upper Body Dressing: Modified independent (Device) Where Assessed-Upper Body Dressing: Wheelchair Lower Body Dressing: Setup Where Assessed-Lower Body Dressing: Bed level Toileting: Minimal assistance Where Assessed-Toileting: Bedside Commode Toilet Transfer: Minimal assistance, Contact guard Toilet Transfer Method: Theatre manager: Drop arm bedside commode ADL Comments: patient able to place bed pan and use urinal with set up if family not present to assist with transfer Vision   Perception    Praxis   Exercises:   Other Treatments:     Therapy/Group: Individual Therapy  Tonny Branch 04/15/2019, 3:26 PM

## 2019-04-15 NOTE — Progress Notes (Signed)
[facility] PHYSICAL MEDICINE & REHABILITATION PROGRESS NOTE   Subjective/Complaints:  Pt reports not able to go home-  shrinkers irritating sometimes at night.  Asking if can take them off at night.  Feels like cannot move BKAs as well.     ROS:   Pt denies SOB, abd pain, CP, N/V/C/D, and vision changes    Objective:   No results found. Recent Labs    04/13/19 0537 04/15/19 0459  WBC 11.0* 8.3  HGB 8.0* 7.9*  HCT 25.9* 25.6*  PLT 222 210   Recent Labs    04/13/19 0537 04/15/19 0459  NA 132* 131*  K 4.3 4.4  CL 94* 92*  CO2 21* 21*  GLUCOSE 126* 118*  BUN 106* 108*  CREATININE 12.26* 11.99*  CALCIUM 8.4* 8.3*    Intake/Output Summary (Last 24 hours) at 04/15/2019 0809 Last data filed at 04/15/2019 0725 Gross per 24 hour  Intake 478 ml  Output --  Net 478 ml     Physical Exam: Vital Signs Blood pressure (!) 122/57, pulse 72, temperature 98.4 F (36.9 C), temperature source Oral, resp. rate 18, height 4\' 8"  (1.422 m), weight 68.9 kg, SpO2 99 %.  Constitutional:  Pt sitting up in bed; appropriate, NAD HEENT:conjuagte gaze Neck: supple Cardiovascular: RRR_ no JVD  Respiratory/Chest: CTA b/L- good air movement GI/Abdomen: soft, NT, ND, (+)BS- stable knot/hematoma on LLQ Ext: no clubbing, cyanosis, or edema Psych: appropriate Musc: B/L BKAs- assessed R BKA this MA_ looks great- some dry flaky skin, but no erythema or drainage seen; (+) staples in tact- not assessed today Neuro: Ox3-   Motor:  Bilateral lower extremities: Hip flexion, knee extension 5/5, unchanged  Assessment/Plan: 1. Functional deficits secondary to B/L BKAs due to gangrene/osteomyelitis which require 3+ hours per day of interdisciplinary therapy in a comprehensive inpatient rehab setting.  Physiatrist is providing close team supervision and 24 hour management of active medical problems listed below.  Physiatrist and rehab team continue to assess barriers to discharge/monitor patient  progress toward functional and medical goals  Care Tool:  Bathing    Body parts bathed by patient: Right arm, Left arm, Chest, Abdomen, Face, Front perineal area, Right upper leg, Left upper leg, Buttocks   Body parts bathed by helper: Buttocks Body parts n/a: Right lower leg, Left lower leg   Bathing assist Assist Level: Set up assist     Upper Body Dressing/Undressing Upper body dressing   What is the patient wearing?: Pull over shirt    Upper body assist Assist Level: Independent    Lower Body Dressing/Undressing Lower body dressing      What is the patient wearing?: Underwear/pull up, Pants     Lower body assist Assist for lower body dressing: Set up assist     Toileting Toileting    Toileting assist Assist for toileting: Set up assist     Transfers Chair/bed transfer  Transfers assist     Chair/bed transfer assist level: Minimal Assistance - Patient > 75%     Locomotion Ambulation   Ambulation assist   Ambulation activity did not occur: Safety/medical concerns          Walk 10 feet activity   Assist  Walk 10 feet activity did not occur: Safety/medical concerns        Walk 50 feet activity   Assist Walk 50 feet with 2 turns activity did not occur: Safety/medical concerns         Walk 150 feet activity   Assist  Walk 150 feet activity did not occur: Safety/medical concerns         Walk 10 feet on uneven surface  activity   Assist Walk 10 feet on uneven surfaces activity did not occur: Safety/medical concerns         Wheelchair     Assist Will patient use wheelchair at discharge?: Yes Type of Wheelchair: Manual    Wheelchair assist level: Independent Max wheelchair distance: 138ft    Wheelchair 50 feet with 2 turns activity    Assist        Assist Level: Independent   Wheelchair 150 feet activity     Assist      Assist Level: Independent   Blood pressure (!) 122/57, pulse 72, temperature  98.4 F (36.9 C), temperature source Oral, resp. rate 18, height 4\' 8"  (1.422 m), weight 68.9 kg, SpO2 99 %.  Medical Problem List and Plan: 1.Impaired function/ADLs/mobilitysecondary to B/L BKAs due to osteomyelitis and gangrene- s/p IV ABX- completed 3/18  Continue CIR  2. Antithrombotics: -DVT/anticoagulation:Pharmaceutical:Heparindue to PD/ESRD -antiplatelet therapy:  3. Pain Management:Hydrocodone as needed- per pt, pain meds are suitable/working  4/4 increase gabapentin to 200mg  qhs for phantom limb pain  4/5- pt reports pain adequately managed 4. Mood: LCSW to follow for evaluation and support -antipsychotic agents:  5. Neuropsych: This patientiscapable of making decisions onhis own behalf. 6. Skin/Wound Care:   Will need to monitor incisions closely given recent fall  4/5- BKAs look OK considering 2 falls  4/6- skin looking good- plans on wearing shrinkers today (didn't yesterday)  4/7- can take off shrinkers for a few hours max/day.  7. Fluids/Electrolytes/Nutrition:Strict I's and O's. Monitor weights daily after PD. 8.  ESRD: Continue CCPD daily.Is oliguric, not anuric- voids ~2x/day  4/7- need to go to Hemodialysis since going to SNF 9.Acute on chronic anemia: On ESA weekly.   Hemoglobin 7.4 on 3/26, continue to monitor  3/31- Last Hb 7/8- will recheck tomorrow  4/1- Hb 7.5- stable  4/5- Hb 8.0- better but variable  4/7- Hb 7.9- con't meds 10.Nutrition: Severe protein malnutrition-Renal/carb modified diet to continue with prostat due to low calorie/proteinmalnutrition. 11. NKN:LZJQBHA blood pressures twice daily. Off hydralazine and furosemide at this time.   Con't clonidine and Norvasc   4/6- BP controlled- con't meds 12.T2DM with hyperglycemia: A1c 7.8-Continue Lantus daily--d/c meal coverage due to hypoglycemic episodes.Monitor blood sugars AC at bedtime for now due to hypoglycemia.   CBG (last 3)  Recent Labs     04/14/19 1756 04/14/19 2115 04/15/19 0608  GLUCAP 174* 110* 97   Lantus decreased to 15 units on 3/27 (already received a.m. dose)  Labile with hypoglycemia on 3/28.  Will need to monitor closely overnight.  Bedtime snack ordered for tonight.   3/30- DM coord asked me to increase Lantus to 18 units-   4/3: cbgs remain elevated, labile. Treating and highs and lows which causes rebound effect  4/4 Lantus 20u daily started today, increase mealtime covg to 5u  4/5- was low last night- didn't eat snack- was 78- will check with nursing  4/6- BGs doing OK today- had night snaxk  4/7- Bgs 97-174- con't regimen 14. Leukocytosis: Resolved  3/30- WBC down to 8.9 from 13 yesterday 15. Hyperkalemia  3/30- up to 5.3- will see if renal will address since renal pt- if not, will try Lokelma.  3/31- Renal ordered Lokelma x1- wil recheck labs in AM  4/1- K+ down to 4.4- con't regimen 16. Constipation  3/31- will  order Sorbitol 60 ml then follow in 3-4 hours with enema.   4/1- cleaned out- reminded pt to NOT refuse bowel meds  4/4 reiterating use/need of preventative bowel meds 17. LLQ Hematoma  4/2- will monitor hematoma- explained to try and avoid injecting into it with SQ Heparin- if not improving, will order imaging next week.  4/5- is getting smaller- was small plum, now large grape size.  18. [name]  4/5- pt seems to think can go home, but is min assist for transfers, not Mod I or even supervision- team is trying to address.  4/6- pt emphatic that will have family at home- - won't entertain any other ideas.   4/7- pt's family won't take home- will need to go to SNF- will send out this week.    LOS: 15 days A FACE TO FACE EVALUATION WAS PERFORMED  Katherinne Mofield 04/15/2019, 8:09 AM
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
## 2019-04-16 ENCOUNTER — Inpatient Hospital Stay (HOSPITAL_COMMUNITY): Payer: Medicare (Managed Care)

## 2019-04-16 ENCOUNTER — Inpatient Hospital Stay (HOSPITAL_COMMUNITY): Payer: Medicare (Managed Care) | Admitting: Occupational Therapy

## 2019-04-16 HISTORY — PX: IR US GUIDE VASC ACCESS RIGHT: IMG2390

## 2019-04-16 HISTORY — PX: IR FLUORO GUIDE CV LINE RIGHT: IMG2283

## 2019-04-16 LAB — GLUCOSE, CAPILLARY
Glucose-Capillary: 108 mg/dL — ABNORMAL HIGH (ref 70–99)
Glucose-Capillary: 105 mg/dL — ABNORMAL HIGH (ref 70–99)
Glucose-Capillary: 67 mg/dL — ABNORMAL LOW (ref 70–99)
Glucose-Capillary: 92 mg/dL (ref 70–99)
Glucose-Capillary: 208 mg/dL — ABNORMAL HIGH (ref 70–99)

## 2019-04-16 IMAGING — XA IR FLUORO GUIDE CV LINE*R*
2 series · 3 of 3 positions shown · non-contrast
Comparison: none

INDICATION: 48-year-old male with a history of end-stage renal disease referred
for tunneled HD placement

[Series 1: (id) · 2 of 2 slices shown]
[im 1/2]
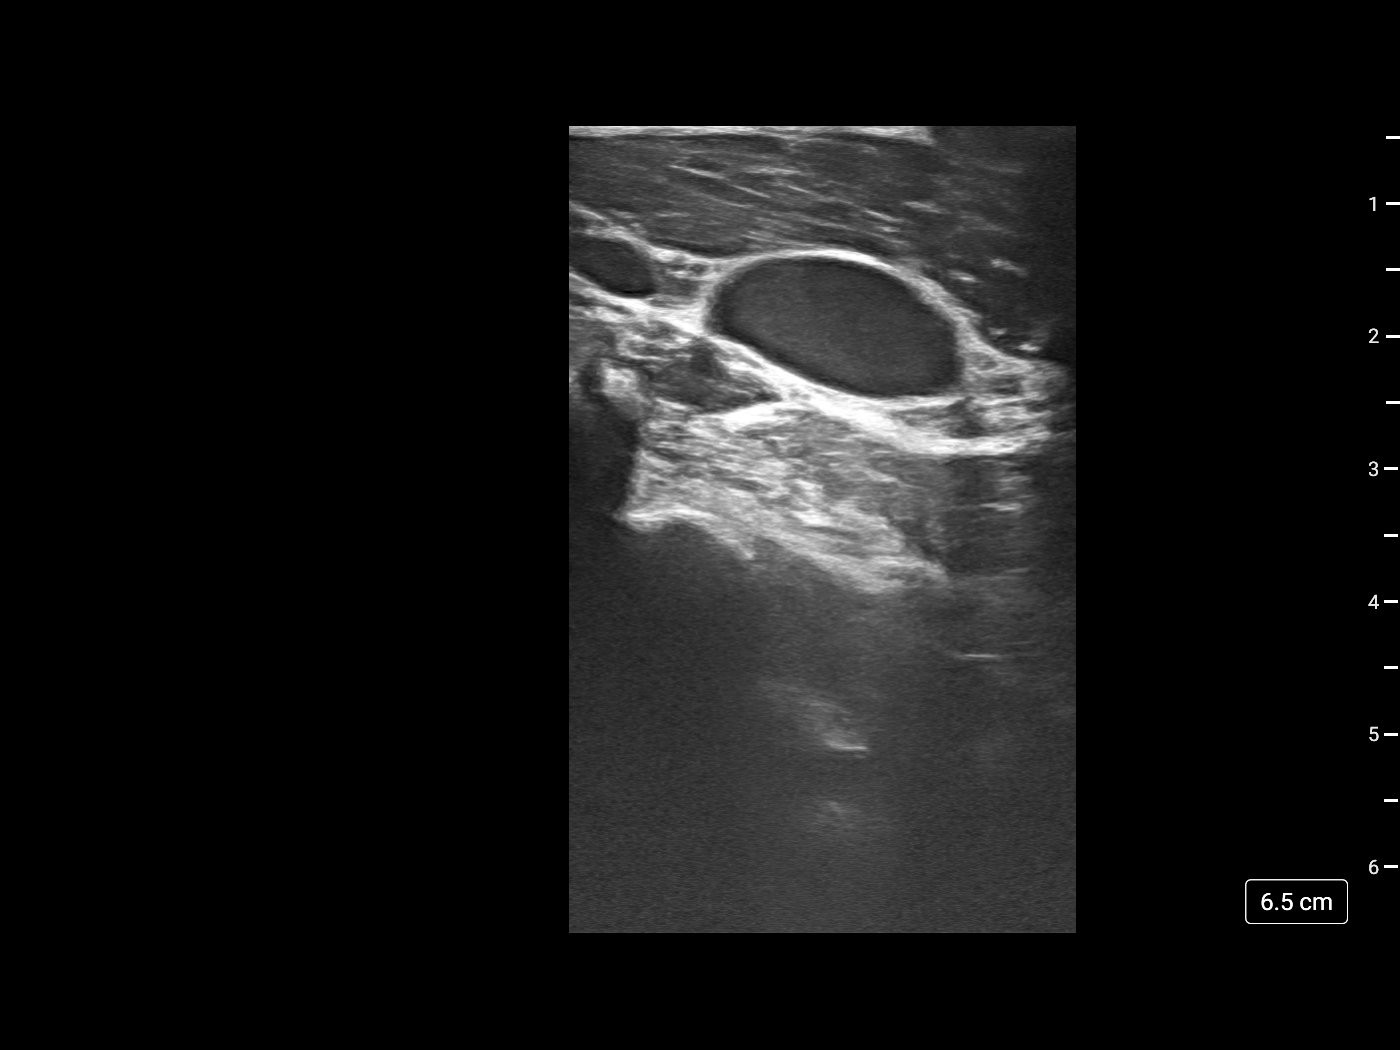
[im 2/2]
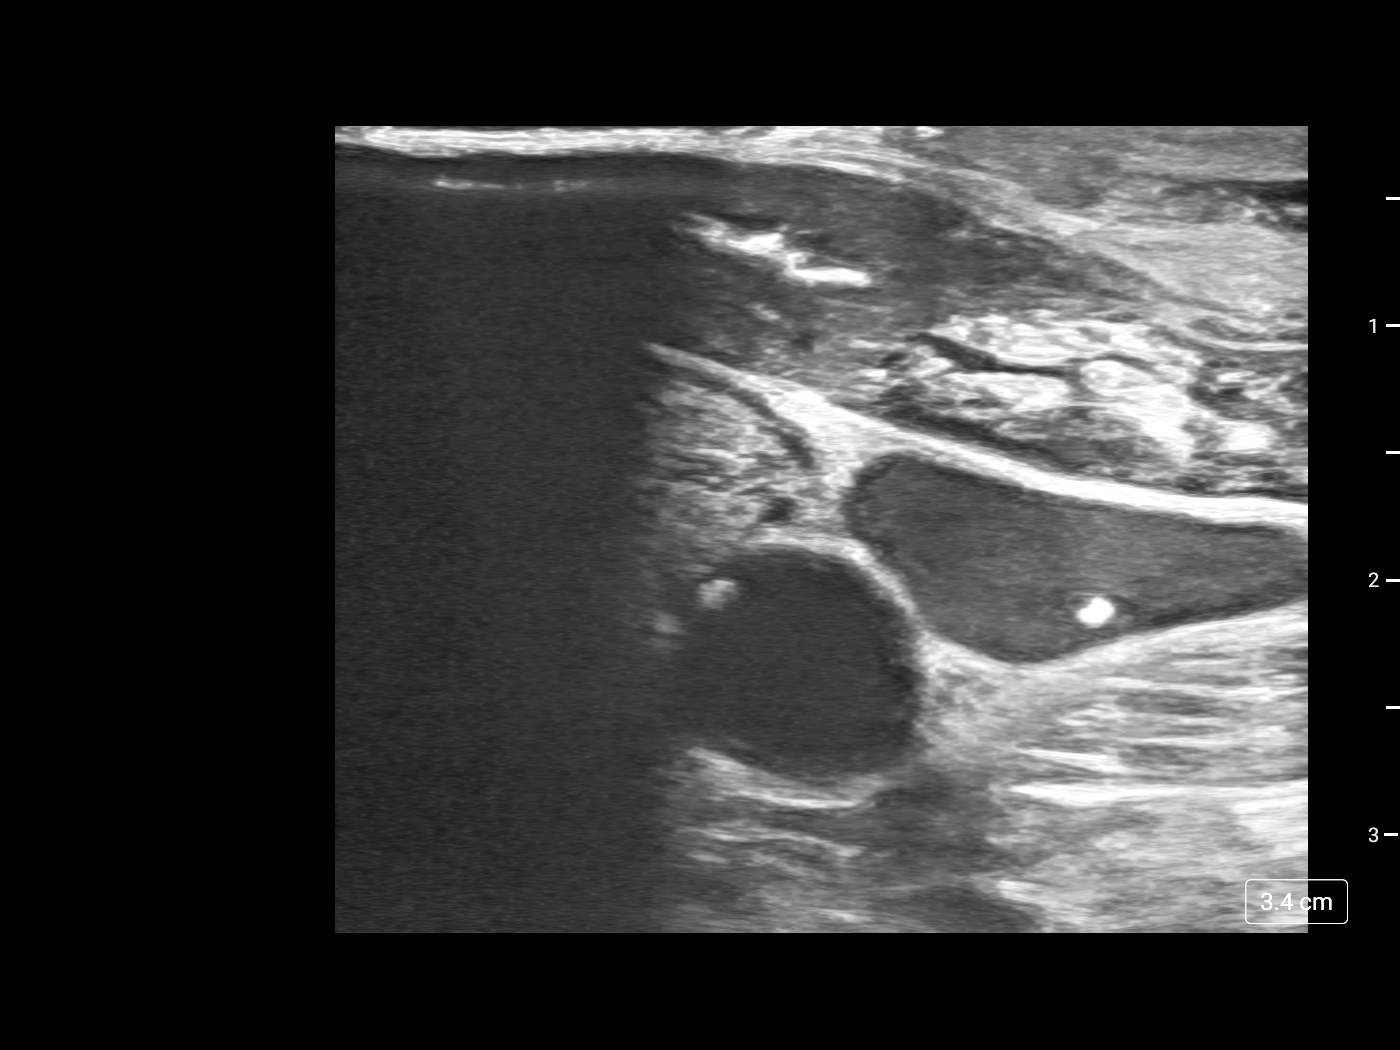

[Series 300: dsa body · 1 of 1 slices shown]
[im 1/1]
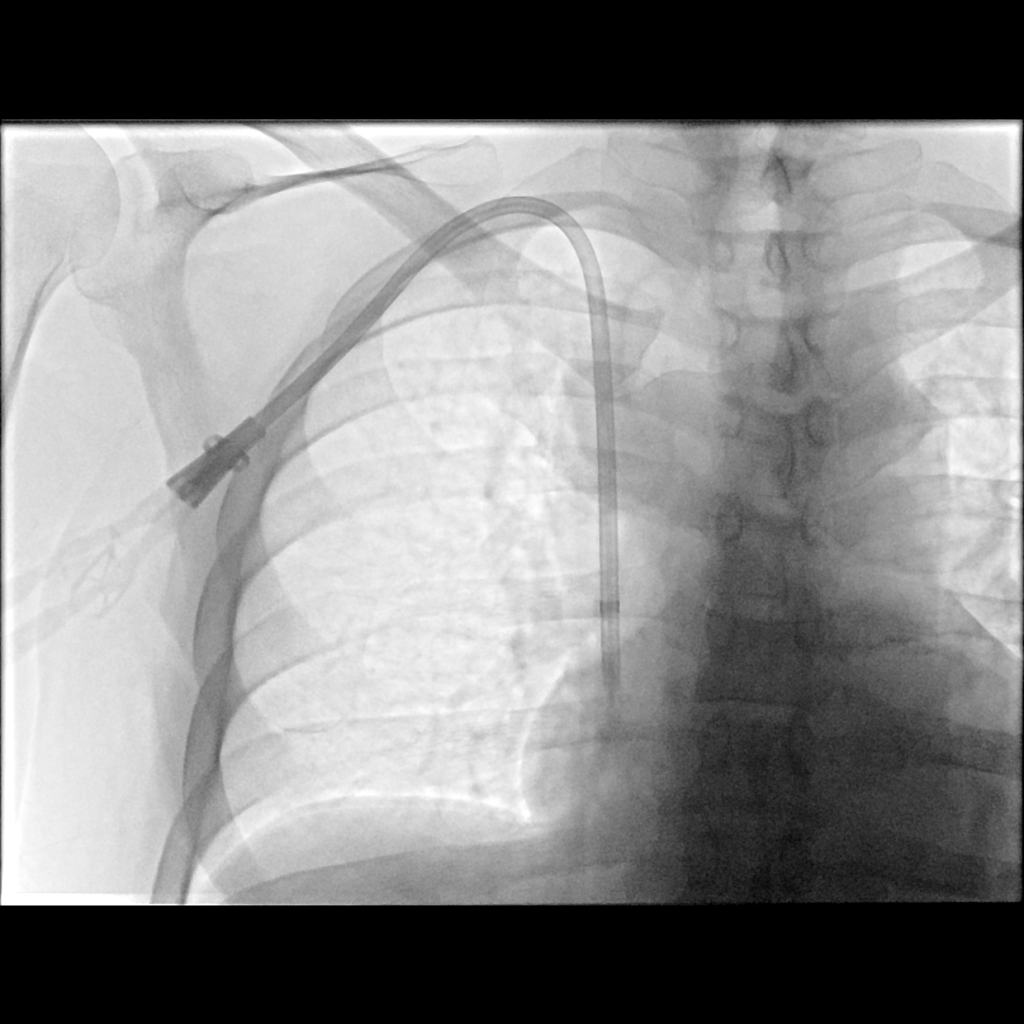

[3 of 3 positions shown; findings below may reference images not displayed]

EXAM:
TUNNELED CENTRAL VENOUS HEMODIALYSIS CATHETER PLACEMENT WITH
ULTRASOUND AND FLUOROSCOPIC GUIDANCE

MEDICATIONS:
2 g Ancef. The antibiotic was given in an appropriate time interval
prior to skin puncture.

ANESTHESIA/SEDATION:
Moderate (conscious) sedation was employed during this procedure. A
total of Versed 1.0 mg and Fentanyl 50 mcg was administered
intravenously.

Moderate Sedation Time: 19 minutes. The patient's level of
consciousness and vital signs were monitored continuously by
radiology nursing throughout the procedure under my direct
supervision.

FLUOROSCOPY TIME:  Fluoroscopy Time: 0 minutes 54 seconds (4 mGy).

COMPLICATIONS:
None



After creating a small venotomy incision, a micropuncture kit was
utilized to access the right internal jugular vein under direct,
real-time ultrasound guidance after the overlying soft tissues were
anesthetized with 1% lidocaine with epinephrine. Ultrasound image
documentation was performed. The microwire was marked to measure
appropriate internal catheter length. External tunneled length was
estimated. A total tip to cuff length of 19 cm was selected.

Skin and subcutaneous tissues of chest wall below the clavicle were
generously infiltrated with 1% lidocaine for local anesthesia. A
small stab incision was made with 11 blade scalpel. The selected
hemodialysis catheter was tunneled in a retrograde fashion from the
anterior chest wall to the venotomy incision.

A guidewire was advanced to the level of the IVC and the
micropuncture sheath was exchanged for a peel-away sheath.

The catheter was then placed through the peel-away sheath with tips
ultimately positioned within the superior aspect of the right
atrium. Final catheter positioning was confirmed and documented with
a spot radiographic image. The catheter aspirates and flushes
normally. The catheter was flushed with appropriate volume heparin
dwells.

The catheter exit site was secured with a 0-Prolene retention
suture. The venotomy incision was closed Derma bond and sterile
dressing. Dressings were applied at the chest wall.

Patient tolerated the procedure well and remained hemodynamically
stable throughout.

No complications were encountered and no significant blood loss
encountered.
IMPRESSION: Status post right IJ tunneled hemodialysis catheter placement.
Catheter ready for use.

## 2019-04-16 MED ORDER — CEFAZOLIN SODIUM-DEXTROSE 2-4 GM/100ML-% IV SOLN
INTRAVENOUS | Status: AC
  Filled 2019-04-16: qty 100

## 2019-04-16 MED ORDER — LIDOCAINE HCL (PF) 1 % IJ SOLN
INTRAMUSCULAR | Status: AC | PRN
  Administered 2019-04-16: 10 mL

## 2019-04-16 MED ORDER — HEPARIN SODIUM (PORCINE) 1000 UNIT/ML IJ SOLN
INTRAMUSCULAR | Status: AC
  Administered 2019-04-16: 3.2 mL
  Filled 2019-04-16: qty 1

## 2019-04-16 MED ORDER — DEXTROSE 50 % IV SOLN: 12.5000 g | INTRAVENOUS | Status: AC

## 2019-04-16 MED ORDER — GELATIN ABSORBABLE 12-7 MM EX MISC
CUTANEOUS | Status: AC
  Filled 2019-04-16: qty 1

## 2019-04-16 MED ORDER — CEFAZOLIN SODIUM-DEXTROSE 1-4 GM/50ML-% IV SOLN
INTRAVENOUS | Status: AC | PRN
  Administered 2019-04-16: 2 g via INTRAVENOUS

## 2019-04-16 MED ORDER — MIDAZOLAM HCL 2 MG/2ML IJ SOLN
INTRAMUSCULAR | Status: AC | PRN
  Administered 2019-04-16: 1 mg via INTRAVENOUS

## 2019-04-16 MED ORDER — LIDOCAINE HCL 1 % IJ SOLN
INTRAMUSCULAR | Status: AC
  Filled 2019-04-16: qty 20

## 2019-04-16 MED ORDER — FENTANYL CITRATE (PF) 100 MCG/2ML IJ SOLN
INTRAMUSCULAR | Status: AC
  Filled 2019-04-16: qty 2

## 2019-04-16 MED ORDER — CHLORHEXIDINE GLUCONATE CLOTH 2 % EX PADS
6.0000 | MEDICATED_PAD | Freq: Every day | CUTANEOUS | Status: DC
  Administered 2019-04-16 – 2019-04-19 (×4): 6 via TOPICAL

## 2019-04-16 MED ORDER — DEXTROSE 50 % IV SOLN
INTRAVENOUS | Status: AC
  Administered 2019-04-16: 12.5 g via INTRAVENOUS
  Filled 2019-04-16: qty 50

## 2019-04-16 MED ORDER — MIDAZOLAM HCL 2 MG/2ML IJ SOLN
INTRAMUSCULAR | Status: AC
  Filled 2019-04-16: qty 2

## 2019-04-16 MED ORDER — FENTANYL CITRATE (PF) 100 MCG/2ML IJ SOLN
INTRAMUSCULAR | Status: AC | PRN
  Administered 2019-04-16: 50 ug via INTRAVENOUS

## 2019-04-16 NOTE — NC FL2 (Signed)
[place] LEVEL OF CARE SCREENING TOOL     IDENTIFICATION  Patient Name: Jorge [name] Birthdate: 1970/05/13 Sex: male Admission Date (Current Location): 03/31/2019  [facility] and [place] Number:  Herbalist and Address:         Provider Number: [id number]  Attending Physician Name and Address:  Courtney Heys, MD  Relative Name and Phone Number:  Kelli Egolf #[phone]    Current Level of Care: Hospital Recommended Level of Care: [place] Prior Approval Number:    Date Approved/Denied:   PASRR Number: [id number]  Discharge Plan: SNF    Current Diagnoses: Patient Active Problem List   Diagnosis Date Noted  . Fall   . Hypoglycemia   . Labile blood glucose   . Anemia of chronic disease   . Acute blood loss anemia   . S/P BKA (below knee amputation) bilateral ([place]) 03/31/2019  . Below knee amputation ([place]) 03/31/2019  . Severe protein-calorie malnutrition ([place]) 03/20/2019  . ESRD on peritoneal dialysis ([place])   . ESRD (end stage renal disease) ([place]) 03/19/2019  . Hypertension   . Insulin-requiring or dependent type II diabetes mellitus ([place])   . Normocytic anemia     Orientation RESPIRATION BLADDER Height & Weight     Self, Time, Situation, Place  Normal Continent Weight: 150 lb 12.7 oz (68.4 kg) Height:  4\' 8"  (142.2 cm)  BEHAVIORAL SYMPTOMS/MOOD NEUROLOGICAL BOWEL NUTRITION STATUS      Continent Diet(Renal diet)  AMBULATORY STATUS COMMUNICATION OF NEEDS Skin   Limited Assist Verbally Other (Comment)(Bilateral BKA-[place] covered with tefla pad dressing around midlines of wound, and then covered with shrinker)                       Personal Care Assistance Level of Assistance  Bathing, Dressing Bathing Assistance: Limited assistance   Dressing Assistance: Limited assistance     Functional Limitations Info             SPECIAL CARE FACTORS FREQUENCY                       Contractures       Additional Factors Info                  Current Medications (04/16/2019):  This is the current hospital active medication list Current Facility-Administered Medications  Medication Dose Route Frequency Provider Last Rate Last Admin  . acetaminophen (TYLENOL) tablet 325-650 mg  325-650 mg Oral Q4H PRN Bary Leriche, PA-C   650 mg at 04/10/19 1010  . amLODipine (NORVASC) tablet 10 mg  10 mg Oral Daily Bary Leriche, PA-C   10 mg at 04/16/19 0847  . aspirin EC tablet 81 mg  81 mg Oral Daily Lovorn, Megan, MD   81 mg at 04/16/19 0847  . atorvastatin (LIPITOR) tablet 20 mg  20 mg Oral q1800 Bary Leriche, PA-C   20 mg at 04/15/19 1739  . bisacodyl (DULCOLAX) suppository 10 mg  10 mg Rectal Daily PRN Bary Leriche, PA-C   10 mg at 04/08/19 0128  . calcitRIOL (ROCALTROL) capsule 0.5 mcg  0.5 mcg Oral Once per day on Mon Wed Fri Love, Pamela S, PA-C   0.5 mcg at 04/15/19 1517  . cloNIDine (CATAPRES) tablet 0.1 mg  0.1 mg Oral QHS Love, Pamela S, PA-C   0.1 mg at 04/15/19 2123  . Darbepoetin Alfa (ARANESP) injection 200 mcg  200  mcg Subcutaneous Q Thu-1800 Hammons, Kimberly B, RPH   200 mcg at 04/09/19 1747  . dialysis solution 1.5% low-MG/low-CA dianeal solution   Intraperitoneal Q24H Roney Jaffe, MD      . diphenhydrAMINE (BENADRYL) 12.5 MG/5ML elixir 12.5-25 mg  12.5-25 mg Oral Q6H PRN Love, Pamela S, PA-C      . docusate sodium (COLACE) capsule 100 mg  100 mg Oral BID Love, Pamela S, PA-C   100 mg at 04/16/19 0847  . feeding supplement (PRO-STAT SUGAR FREE 64) liquid 30 mL  30 mL Oral TID BM Love, Ivan Anchors, PA-C   30 mL at 04/15/19 2122  . gabapentin (NEURONTIN) capsule 200 mg  200 mg Oral QHS Meredith Staggers, MD   200 mg at 04/15/19 2123  . gentamicin cream (GARAMYCIN) 0.1 % 1 application  1 application Topical Daily Roney Jaffe, MD   1 application at 13/08/65 0847  . guaiFENesin-dextromethorphan (ROBITUSSIN DM) 100-10 MG/5ML syrup 5-10 mL  5-10 mL Oral Q6H PRN Love, Pamela S,  PA-C      . heparin 2,500 Units in dialysis solution 1.5% low-MG/low-CA 5,000 mL dialysis solution   Peritoneal Dialysis PRN Alric Seton, PA-C      . HYDROcodone-acetaminophen (NORCO/VICODIN) 5-325 MG per tablet 1-2 tablet  1-2 tablet Oral Q4H PRN Bary Leriche, PA-C   1 tablet at 04/13/19 2054  . insulin aspart (novoLOG) injection 0-5 Units  0-5 Units Subcutaneous QHS Bary Leriche, PA-C   3 Units at 04/11/19 2155  . insulin aspart (novoLOG) injection 0-6 Units  0-6 Units Subcutaneous TID WC Bary Leriche, PA-C   1 Units at 04/15/19 1756  . insulin aspart (novoLOG) injection 5 Units  5 Units Subcutaneous TID WC Meredith Staggers, MD   5 Units at 04/15/19 1755  . insulin glargine (LANTUS) injection 20 Units  20 Units Subcutaneous Daily Meredith Staggers, MD   20 Units at 04/16/19 [phone]  . milk and molasses enema  1 enema Rectal Daily PRN Love, Pamela S, PA-C      . multivitamin (RENA-VIT) tablet 1 tablet  1 tablet Oral QHS Bary Leriche, PA-C   1 tablet at 04/15/19 2123  . polyethylene glycol (MIRALAX / GLYCOLAX) packet 17 g  17 g Oral Daily PRN Bary Leriche, PA-C   17 g at 04/08/19 0737  . prochlorperazine (COMPAZINE) tablet 5-10 mg  5-10 mg Oral Q6H PRN Bary Leriche, PA-C   10 mg at 04/02/19 1312   Or  . prochlorperazine (COMPAZINE) injection 5-10 mg  5-10 mg Intramuscular Q6H PRN Love, Pamela S, PA-C       Or  . prochlorperazine (COMPAZINE) suppository 12.5 mg  12.5 mg Rectal Q6H PRN Love, Pamela S, PA-C      . senna (SENOKOT) tablet 8.6 mg  1 tablet Oral BID Lovorn, Megan, MD   8.6 mg at 04/16/19 0847  . sevelamer carbonate (RENVELA) tablet 3,200 mg  3,200 mg Oral TID WC Lovorn, Megan, MD   3,200 mg at 04/15/19 1739  . sevelamer carbonate (RENVELA) tablet 800 mg  800 mg Oral BID PRN Love, Pamela S, PA-C      . traZODone (DESYREL) tablet 25-50 mg  25-50 mg Oral QHS PRN Bary Leriche, PA-C   50 mg at 04/15/19 2123     Discharge Medications: Please see discharge summary for a list  of discharge medications.  Relevant Imaging Results:  Relevant Lab Results:   Additional Information Pt has traditional Medicare A/B  and Rx-[employer]. Medicare ID#[id number]; Pt will be transitioned to from PD to HD on 04/17/2019 for HD cath.  Loralee Pacas, MSW, [place] Office: [phone] Cell: [phone] Fax: [phone]

## 2019-04-16 NOTE — Sedation Documentation (Signed)
Cardiac rhythm- NSR

## 2019-04-16 NOTE — Progress Notes (Signed)
Occupational Therapy Session Note  Patient Details  Name: TRIG MCBRYAR MRN: [id number] Date of Birth: 1970/12/16  Today's Date: 04/16/2019 OT Individual Time: 9407-6808 OT Individual Time Calculation (min): 30 min    Short Term Goals: Week 2:  OT Short Term Goal 1 (Week 2): Patient will complete SB transfers to/from drop arm commode, bed and w/c with CS OT Short Term Goal 2 (Week 2): patient will complete toileting at drop arm commode with CS/set up  Skilled Therapeutic Interventions/Progress Updates:    Patient in bed, alert, states that he just got back into bed and is too tired to get back out.  He agrees to complete bed level core mobility/strengthening, shoulder/scapula exercises and LB conditioning activities.  Reviewed importance of ongoing conditioning and focus on muscle groups needed for ambulation when he is ready for gait training in the future.  He remained in bed at the close of session.  States that he is glad that we reviewed exercises that he can continue on his own.  Bed alarm set and call bell in reach.  Therapy Documentation Precautions:  Precautions Precautions: Fall Precaution Comments: bliateral limb guards Restrictions Weight Bearing Restrictions: Yes RLE Weight Bearing: Non weight bearing LLE Weight Bearing: Non weight bearing  Therapy/Group: Individual Therapy  Carlos Levering 04/16/2019, 7:36 AM

## 2019-04-16 NOTE — Significant Event (Signed)
Hypoglycemic Event  CBG: 67  Treatment: D50 25 mL (12.5 gm)  Symptoms: None  Follow-up CBG: Time: 1225 CBG Result: 105  Possible Reasons for Event: Inadequate meal intake  Comments/MD notified: Linna Hoff, [place] notified. No new orders    [employer]

## 2019-04-16 NOTE — Progress Notes (Signed)
Physical Therapy Weekly Progress Note  Patient Details  Name: Jorge [name] MRN: [id number] Date of Birth: 05/21/1970  Beginning of progress report period: April 09, 2019 End of progress report period: April 16, 2019  Today's Date: 04/16/2019 PT Individual Time: 1500-1530 PT Individual Time Calculation (min): 30 min   Patient planned for d/c home on 04/15/19, however, following family education family stated that the patient would not have the required level of assistance for patient to perform mobility and self-care safely at home. Changed d/c plan to SNF for patient to improve strength and independence with mobility prior to d/c home. Patient will need to transition form PD to HD prior to SNF placement. Patient currently BID for therapies to continue working on deficits in functional mobility, balance, strength, activity tolerance, and patient education until d/c to SNF. Patient is currently independent with bed mobility, requires CGA for slide board transfers bed<>w/c and car transfers, and is mod I for w/c mobility >200 ft.   Patient continues to demonstrate the following deficits muscle weakness and muscle joint tightness, decreased cardiorespiratoy endurance and decreased sitting balance, decreased postural control and difficulty maintaining precautions and therefore will continue to benefit from skilled PT intervention to increase functional independence with mobility.  Patient progressing toward long term goals..  Plan of care revisions: Change d/c plan from d/c home to d/c SNF due to limited family support at home.  PT Short Term Goals Week 2:  PT Short Term Goal 1 (Week 2): STG = LTG due to short ELOS. Week 3:  PT Short Term Goal 1 (Week 3): STG = LTG due to short ELOS.  Skilled Therapeutic Interventions/Progress Updates:     Patient in bed upon PT arrival. Patient alert and agreeable to PT session. Patient denied pain during session, but reported mild soreness at HD port site. Discussed  d/c planning to SNF including therapy expectations and goals of SNF placement to increase independence and reduce caregiver burden. Reviewed HEP and performed 1 set of exercises without cues. Educated on performing 2x per day outside of therapies and to focus on transfer training and UE and core strengthening during therapy sessions to improve slide board and A/P transfers. Discussed increased safety with performing A/P transfers independently if patient is able. Also discussed expectations for prosthesis and continued therapy following d/c from SNF. Patient very motivated and excited for prosthesis training and maintaining residual limb skin integrity and ROM to promote success with B prosthesis.  Patient in bed at end of session with breaks locked, bed alarm set, and all needs within reach.    Therapy Documentation Precautions:  Precautions Precautions: Fall Precaution Comments: bliateral limb guards Restrictions Weight Bearing Restrictions: Yes RLE Weight Bearing: Non weight bearing LLE Weight Bearing: Non weight bearing   Therapy/Group: Individual Therapy  Nikole Swartzentruber L Lucca Greggs PT, DPT  04/16/2019, 5:35 PM

## 2019-04-16 NOTE — Progress Notes (Signed)
[facility] Progress Note   Subjective:  Seen in room.  No issues with PD overnight.  For [facility] in IR today.    Denies shortness of breath  Denies chest pain  Denies n/v   Objective Vitals:   04/15/19 1900 04/15/19 2027 04/16/19 0544 04/16/19 0755  BP: (!) 109/45 (!) 109/45 (!) 111/37 (!) 105/48  Pulse: 75 75 68 68  Resp: 18  18 18   Temp: 98.9 F (37.2 C) 98.9 F (37.2 C) (!) 97.3 F (36.3 C) 97.6 F (36.4 C)  TempSrc: Oral Oral Oral Oral  SpO2: 99% 99% 100%   Weight:    68.4 kg  Height:        Weight change: -0.3 kg   Additional Objective Labs: Basic Metabolic Panel: Recent Labs  Lab 04/10/19 0554 04/13/19 0537 04/15/19 0459  NA 133* 132* 131*  K 4.6 4.3 4.4  CL 93* 94* 92*  CO2 23 21* 21*  GLUCOSE 189* 126* 118*  BUN 101* 106* 108*  CREATININE 12.79* 12.26* 11.99*  CALCIUM 8.6* 8.4* 8.3*  PHOS 5.9* 5.5* 6.3*   CBC: Recent Labs  Lab 04/13/19 0537 04/15/19 0459  WBC 11.0* 8.3  NEUTROABS  --  4.8  HGB 8.0* 7.9*  HCT 25.9* 25.6*  MCV 96.6 96.2  PLT 222 210   Blood Culture    Component Value Date/Time   SDES BLOOD SITE NOT SPECIFIED 03/19/2019 2016   SPECREQUEST  03/19/2019 2016    BOTTLES DRAWN AEROBIC AND ANAEROBIC Blood Culture adequate volume   CULT  03/19/2019 2016    NO GROWTH 5 DAYS Performed at [facility], [place] [address], [place], [place] [address]    REPTSTATUS 03/24/2019 FINAL 03/19/2019 2016     Physical Exam General:  Well appearing,lying in bed, nad  Heart: RRR No m,r,g Lungs: clear bilaterally  Abdomen: soft non-tender; non-distended  Extremities: bilateral BKA; no stump edema  Dialysis Access: PD cath intact   Medications: . dialysis solution 1.5% low-MG/low-CA     . amLODipine  10 mg Oral Daily  . aspirin EC  81 mg Oral Daily  . atorvastatin  20 mg Oral q1800  . calcitRIOL  0.5 mcg Oral Once per day on Mon Wed Fri  . cloNIDine  0.1 mg Oral QHS  . darbepoetin (ARANESP) injection - NON-DIALYSIS   200 mcg Subcutaneous Q Thu-1800  . docusate sodium  100 mg Oral BID  . feeding supplement (PRO-STAT SUGAR FREE 64)  30 mL Oral TID BM  . gabapentin  200 mg Oral QHS  . gentamicin cream  1 application Topical Daily  . insulin aspart  0-5 Units Subcutaneous QHS  . insulin aspart  0-6 Units Subcutaneous TID WC  . insulin aspart  5 Units Subcutaneous TID WC  . insulin glargine  20 Units Subcutaneous Daily  . multivitamin  1 tablet Oral QHS  . senna  1 tablet Oral BID  . sevelamer carbonate  3,200 mg Oral TID WC    Dialysis Orders: CCPD 7 Days weekly 80 kg 4 exchanges 1.6 mls Dwell time 1.5 hr, No pause or day dwells. Uses mostly 2.5% solution  BMD meds: -Renvela 800 mg 3 tabs PO TID with meals -Calcitriol 0.5 mcg PO TIW (MWF) -   Assessment/Plan: 1. 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 In CIR. Will d/c to SNF. 2. ESRD - Azotemic, suspected from protein intake and marginal PD Rx.  Increased vol to 2.3L on 3/27, kept 4  exchanges.  Later increased dwell time to 2 hrs.  Might need a last bag fill with midday drain as an outpatient   Constipation now resolved.  1. Using all 1.5% on PD.  2. Will need to transition to HD temporarily for discharge to SNF. For placement [facility] by IR today.  Renal coordinator notified for CLIP to outpatient center.   3. Hypertension/volume -  BP controlled. Amlodipine startedhere on3/13 Significantly under edw.  Does not appear volume overloaded.  Will need new edw at d/c. Appears on nightly clonidine as outpt as well.  4. Anemia - HGB initially 7.7 fell to 5.4on3/12 and transfused two units PRBCs.  Heme neg 3/11 Ferritin still increased post surgery to 1420 with drop in tsat to 15% - s/p  a short course of IV Fe (250 x 2) on Aranesp 200/ week - given qThurs. Transfuse as indicated. Note FOBT was checked and positive - per primary team. Pt states has hemorrhoids and no overt blood or dark stools per rectum. Hgb 7.9.   5. Metabolic bone disease -phos improving on current regimen 6. DM - per PMD 7. Nutrition - Albumin low prostat, renal vits.-Renal/carb modified diet. Lactose intolerance noted.    Lynnda Child PA-C [facility] Pager [phone] 04/16/2019,12:49 PM

## 2019-04-16 NOTE — Progress Notes (Signed)
Social Work Patient ID: Jorge [name], male   DOB: 1970/01/22, 49 y.o.   MRN: [id number]    04/15/2019-SW received updates from Pre-Cert Team indicating that pt traditional Medicare A/B was effective 07-08-2017.   04/17/2019- SW received updates from covering [place] on 4/7,  preferred SNF locations include: 1) [place], 2) [place], and 3) [place]. SW to follow-up. SW sent SNF referrals to [facility].   SW left message for: 1) Tonya Cooper/Admissions with [facility] [phone]); 2) Florentina Jenny [place]/Admissions with [facility] [phone]); and 3) Freda Munro Robinson/Admissions with [facility] [phone]) to discuss referral and if able to extend bed offer. SW waiting on follow-up.   Loralee Pacas, MSW, [place] Office: [phone] Cell: [phone] Fax: [phone]

## 2019-04-16 NOTE — Progress Notes (Signed)
[place] PHYSICAL MEDICINE & REHABILITATION PROGRESS NOTE   Subjective/Complaints:  Pt asleep- said didn't want to wake up- is NPO for HD catheter to be placed today.  Was clear by waving me away- doesn't want to wake up.   ROS:   Pt denies SOB, abd pain, CP, N/V/C/D, and vision changes   Objective:   No results found. Recent Labs    04/15/19 0459  WBC 8.3  HGB 7.9*  HCT 25.6*  PLT 210   Recent Labs    04/15/19 0459  NA 131*  K 4.4  CL 92*  CO2 21*  GLUCOSE 118*  BUN 108*  CREATININE 11.99*  CALCIUM 8.3*    Intake/Output Summary (Last 24 hours) at 04/16/2019 0924 Last data filed at 04/15/2019 1801 Gross per 24 hour  Intake 444 ml  Output 201 ml  Net 243 ml     Physical Exam: Vital Signs Blood pressure (!) 105/48, pulse 68, temperature 97.6 F (36.4 C), temperature source Oral, resp. rate 18, height 4\' 8"  (1.422 m), weight 68.4 kg, SpO2 100 %.  Constitutional: pt asleep- woke briefly to say go away HEENT:conjuagte gaze Neck: supple Cardiovascular: RRR_ no JVD  Respiratory/Chest: CTA B/L- good air movement GI/Abdomen: PD cathter R side; soft, NT, ND, (+)BS Ext: no clubbing, cyanosis, or edema Psych: appropriate Musc: B/L BKAs- assessed R BKA this MA_ looks great- some dry flaky skin, but no erythema or drainage seen; (+) staples in tact- not assessed today Neuro: Ox3-   Motor:  Bilateral lower extremities: Hip flexion, knee extension 5/5, unchanged  Assessment/Plan: 1. Functional deficits secondary to B/L BKAs due to gangrene/osteomyelitis which require 3+ hours per day of interdisciplinary therapy in a comprehensive inpatient rehab setting.  Physiatrist is providing close team supervision and 24 hour management of active medical problems listed below.  Physiatrist and rehab team continue to assess barriers to discharge/monitor patient progress toward functional and medical goals  Care Tool:  Bathing    Body parts bathed by patient: Right arm,  Left arm, Chest, Abdomen, Face, Front perineal area, Right upper leg, Left upper leg, Buttocks   Body parts bathed by helper: Buttocks Body parts n/a: Right lower leg, Left lower leg   Bathing assist Assist Level: Set up assist     Upper Body Dressing/Undressing Upper body dressing   What is the patient wearing?: Pull over shirt    Upper body assist Assist Level: Independent    Lower Body Dressing/Undressing Lower body dressing      What is the patient wearing?: Underwear/pull up, Pants     Lower body assist Assist for lower body dressing: Set up assist     Toileting Toileting    Toileting assist Assist for toileting: Set up assist     Transfers Chair/bed transfer  Transfers assist     Chair/bed transfer assist level: Minimal Assistance - Patient > 75%     Locomotion Ambulation   Ambulation assist   Ambulation activity did not occur: Safety/medical concerns          Walk 10 feet activity   Assist  Walk 10 feet activity did not occur: Safety/medical concerns        Walk 50 feet activity   Assist Walk 50 feet with 2 turns activity did not occur: Safety/medical concerns         Walk 150 feet activity   Assist Walk 150 feet activity did not occur: Safety/medical concerns         Walk 10  feet on uneven surface  activity   Assist Walk 10 feet on uneven surfaces activity did not occur: Safety/medical concerns         Wheelchair     Assist Will patient use wheelchair at discharge?: Yes Type of Wheelchair: Manual    Wheelchair assist level: Independent Max wheelchair distance: 1102ft    Wheelchair 50 feet with 2 turns activity    Assist        Assist Level: Independent   Wheelchair 150 feet activity     Assist      Assist Level: Independent   Blood pressure (!) 105/48, pulse 68, temperature 97.6 F (36.4 C), temperature source Oral, resp. rate 18, height 4\' 8"  (1.422 m), weight 68.4 kg, SpO2 100  %.  Medical Problem List and Plan: 1.Impaired function/ADLs/mobilitysecondary to B/L BKAs due to osteomyelitis and gangrene- s/p IV ABX- completed 3/18  Continue CIR  2. Antithrombotics: -DVT/anticoagulation:Pharmaceutical:Heparindue to PD/ESRD -antiplatelet therapy:  3. Pain Management:Hydrocodone as needed- per pt, pain meds are suitable/working  4/4 increase gabapentin to 200mg  qhs for phantom limb pain  4/5- pt reports pain adequately managed 4. Mood: LCSW to follow for evaluation and support -antipsychotic agents:  5. Neuropsych: This patientiscapable of making decisions onhis own behalf. 6. Skin/Wound Care:   Will need to monitor incisions closely given recent fall  4/5- BKAs look OK considering 2 falls  4/6- skin looking good- plans on wearing shrinkers today (didn't yesterday)  4/7- can take off shrinkers for a few hours max/day.  7. Fluids/Electrolytes/Nutrition:Strict I's and O's. Monitor weights daily after PD. 8.  ESRD: Continue CCPD daily.Is oliguric, not anuric- voids ~2x/day  4/7- need to go to Hemodialysis since going to SNF  4/8- going today for HD catheter- is NPO 9.Acute on chronic anemia: On ESA weekly.   Hemoglobin 7.4 on 3/26, continue to monitor  3/31- Last Hb 7/8- will recheck tomorrow  4/1- Hb 7.5- stable  4/5- Hb 8.0- better but variable  4/7- Hb 7.9- con't meds 10.Nutrition: Severe protein malnutrition-Renal/carb modified diet to continue with prostat due to low calorie/proteinmalnutrition. 11. OJJ:KKXFGHW blood pressures twice daily. Off hydralazine and furosemide at this time.   Con't clonidine and Norvasc   4/8- BP a little soft 105/60s- con't meds 12.T2DM with hyperglycemia: A1c 7.8-Continue Lantus daily--d/c meal coverage due to hypoglycemic episodes.Monitor blood sugars AC at bedtime for now due to hypoglycemia.   CBG (last 3)  Recent Labs    04/15/19 1641 04/15/19 2129 04/16/19 0557  GLUCAP  200* 119* 108*   Lantus decreased to 15 units on 3/27 (already received a.m. dose)  Labile with hypoglycemia on 3/28.  Will need to monitor closely overnight.  Bedtime snack ordered for tonight.   3/30- DM coord asked me to increase Lantus to 18 units-   4/3: cbgs remain elevated, labile. Treating and highs and lows which causes rebound effect  4/4 Lantus 20u daily started today, increase mealtime covg to 5u  4/8- BG a little low- is NPO this AM- if drops, will need IVFs D5NS 14. Leukocytosis: Resolved  3/30- WBC down to 8.9 from 13 yesterday 15. Hyperkalemia  3/30- up to 5.3- will see if renal will address since renal pt- if not, will try Lokelma.  3/31- Renal ordered Lokelma x1- wil recheck labs in AM  4/1- K+ down to 4.4- con't regimen 16. Constipation  3/31- will order Sorbitol 60 ml then follow in 3-4 hours with enema.   4/1- cleaned out- reminded pt to NOT refuse  bowel meds  4/4 reiterating use/need of preventative bowel meds 17. LLQ Hematoma  4/2- will monitor hematoma- explained to try and avoid injecting into it with SQ Heparin- if not improving, will order imaging next week.  4/5- is getting smaller- was small plum, now large grape size.  18. [name]  4/5- pt seems to think can go home, but is min assist for transfers, not Mod I or even supervision- team is trying to address.  4/6- pt emphatic that will have family at home- - won't entertain any other ideas.   4/8- going to SNF once gets a bed- and changing to HD   LOS: 16 days A FACE TO FACE EVALUATION WAS PERFORMED  Jorge [name] 04/16/2019, 9:24 AM

## 2019-04-16 NOTE — Progress Notes (Signed)
Occupational Therapy Session Note  Patient Details  Name: Jorge [name] MRN: [id number] Date of Birth: 05-06-1970  Today's Date: 04/16/2019 OT Individual Time: 8466-5993 OT Individual Time Calculation (min): 45 min    Short Term Goals: Week 2:  OT Short Term Goal 1 (Week 2): Patient will complete SB transfers to/from drop arm commode, bed and w/c with CS OT Short Term Goal 2 (Week 2): patient will complete toileting at drop arm commode with CS/set up  Skilled Therapeutic Interventions/Progress Updates:   Pt was in bed when arrived. Pt wanted to perform UB bathing at the sink first. Practiced placing the slide board with min A and transferring with contact guard ito w/c. Pt performed Ub bathing and dressing and oral care with setup A for supplies. Pt then transferred back to bed again with min A for board placement to perform LB bathing and dressing. Pt unaware of BM in brief requiring A to clean some of the BM and then pt was able to complete the rest of his hygiene and then don clean brief (from home) and shorts). Pt does require A to doff shrinkers to wash end of limb and don bilateral shrinkers back on. Education provided on skin integrity on end of residual limb. Left pt in bed in prep for upcoming procedure.   Therapy Documentation Precautions:  Precautions Precautions: Fall Precaution Comments: bliateral limb guards Restrictions Weight Bearing Restrictions: Yes RLE Weight Bearing: Non weight bearing LLE Weight Bearing: Non weight bearing General:   Vital Signs: Therapy Vitals Pulse Rate: 72 Resp: 14 BP: (!) 103/51 Patient Position (if appropriate): Lying Oxygen Therapy SpO2: 98 % O2 Device: Room Air O2 Flow Rate (L/min): 2 L/min End Tidal CO2 (EtCO2): 41 Pain: Pain Assessment Pain Scale: 0-10 Pain Score: 0-No pain   Therapy/Group: Individual Therapy  Willeen Cass [facility] 04/16/2019, 3:32 PM

## 2019-04-16 NOTE — Procedures (Signed)
Interventional Radiology Procedure Note  Procedure: Placement of a right IJ approach tunneled HD catheter, 19cm tip to cuff..  Tip is positioned at the superior cavoatrial junction and catheter is ready for immediate use.  Complications: None Recommendations:  - Ok to use - Do not submerge - Routine line care   Signed,  Dulcy Fanny. Earleen Newport, DO

## 2019-04-17 ENCOUNTER — Inpatient Hospital Stay (HOSPITAL_COMMUNITY): Payer: Medicare (Managed Care) | Admitting: Occupational Therapy

## 2019-04-17 ENCOUNTER — Inpatient Hospital Stay (HOSPITAL_COMMUNITY): Payer: Medicare (Managed Care) | Admitting: *Deleted

## 2019-04-17 ENCOUNTER — Inpatient Hospital Stay (HOSPITAL_COMMUNITY): Payer: Medicare (Managed Care) | Admitting: Physical Therapy

## 2019-04-17 ENCOUNTER — Inpatient Hospital Stay (HOSPITAL_COMMUNITY): Payer: Medicare (Managed Care)

## 2019-04-17 LAB — GLUCOSE, CAPILLARY
Glucose-Capillary: 133 mg/dL — ABNORMAL HIGH (ref 70–99)
Glucose-Capillary: 144 mg/dL — ABNORMAL HIGH (ref 70–99)
Glucose-Capillary: 130 mg/dL — ABNORMAL HIGH (ref 70–99)
Glucose-Capillary: 138 mg/dL — ABNORMAL HIGH (ref 70–99)
Glucose-Capillary: 149 mg/dL — ABNORMAL HIGH (ref 70–99)
Glucose-Capillary: 61 mg/dL — ABNORMAL LOW (ref 70–99)

## 2019-04-17 MED ORDER — GABAPENTIN 300 MG PO CAPS
300.0000 mg | ORAL_CAPSULE | Freq: Every day | ORAL | Status: DC
  Administered 2019-04-17 – 2019-04-23 (×7): 300 mg via ORAL
  Filled 2019-04-17 (×7): qty 1

## 2019-04-17 NOTE — Progress Notes (Signed)
Jorge [name] PHYSICAL MEDICINE & REHABILITATION PROGRESS NOTE   Subjective/Complaints:  Pt reports got his HD catheter yesterday but not until later in afternoon- was frustrated didn't eat until 5pm all day- wishes things more organized- I offered empathy for him and explained at least we aren't doing again.   Said catheter site is sore- took norco for it and it's better.  Bad phantom pain- asking to increase gabapentin.     ROS:   Pt denies SOB, abd pain, CP, N/V/C/D, and vision changes    Objective:   IR Fluoro Guide CV Line Right  Result Date: 04/16/2019 INDICATION: 49 year old male with a history of end-stage renal disease referred for tunneled HD placement EXAM: TUNNELED CENTRAL VENOUS HEMODIALYSIS CATHETER PLACEMENT WITH ULTRASOUND AND FLUOROSCOPIC GUIDANCE MEDICATIONS: 2 g Ancef. The antibiotic was given in an appropriate time interval prior to skin puncture. ANESTHESIA/SEDATION: Moderate (conscious) sedation was employed during this procedure. A total of Versed 1.0 mg and Fentanyl 50 mcg was administered intravenously. Moderate Sedation Time: 19 minutes. The patient's level of consciousness and vital signs were monitored continuously by radiology nursing throughout the procedure under my direct supervision. FLUOROSCOPY TIME:  Fluoroscopy Time: 0 minutes 54 seconds (4 mGy). COMPLICATIONS: None PROCEDURE: Informed written consent was obtained from the patient after a discussion of the risks, benefits, and alternatives to treatment. Questions regarding the procedure were encouraged and answered. The right neck and chest were prepped with chlorhexidine in a sterile fashion, and a sterile drape was applied covering the operative field. Maximum barrier sterile technique with sterile gowns and gloves were used for the procedure. A timeout was performed prior to the initiation of the procedure. After creating a small venotomy incision, a micropuncture kit was utilized to access the right internal  jugular vein under direct, real-time ultrasound guidance after the overlying soft tissues were anesthetized with 1% lidocaine with epinephrine. Ultrasound image documentation was performed. The microwire was marked to measure appropriate internal catheter length. External tunneled length was estimated. A total tip to cuff length of 19 cm was selected. Skin and subcutaneous tissues of chest wall below the clavicle were generously infiltrated with 1% lidocaine for local anesthesia. A small stab incision was made with 11 blade scalpel. The selected hemodialysis catheter was tunneled in a retrograde fashion from the anterior chest wall to the venotomy incision. A guidewire was advanced to the level of the IVC and the micropuncture sheath was exchanged for a peel-away sheath. The catheter was then placed through the peel-away sheath with tips ultimately positioned within the superior aspect of the right atrium. Final catheter positioning was confirmed and documented with a spot radiographic image. The catheter aspirates and flushes normally. The catheter was flushed with appropriate volume heparin dwells. The catheter exit site was secured with a 0-Prolene retention suture. The venotomy incision was closed Derma bond and sterile dressing. Dressings were applied at the chest wall. Patient tolerated the procedure well and remained hemodynamically stable throughout. No complications were encountered and no significant blood loss encountered. IMPRESSION: Status post right IJ tunneled hemodialysis catheter placement. Catheter ready for use. Signed, Dulcy Fanny. Dellia Nims, RPVI [facility] Radiology Electronically Signed   By: Corrie Mckusick D.O.   On: 04/16/2019 16:28   IR US Guide Vasc Access Right  Result Date: 04/16/2019 INDICATION: 49 year old male with a history of end-stage renal disease referred for tunneled HD placement EXAM: TUNNELED CENTRAL VENOUS HEMODIALYSIS CATHETER  PLACEMENT WITH ULTRASOUND AND FLUOROSCOPIC GUIDANCE MEDICATIONS: 2 g  Ancef. The antibiotic was given in an appropriate time interval prior to skin puncture. ANESTHESIA/SEDATION: Moderate (conscious) sedation was employed during this procedure. A total of Versed 1.0 mg and Fentanyl 50 mcg was administered intravenously. Moderate Sedation Time: 19 minutes. The patient's level of consciousness and vital signs were monitored continuously by radiology nursing throughout the procedure under my direct supervision. FLUOROSCOPY TIME:  Fluoroscopy Time: 0 minutes 54 seconds (4 mGy). COMPLICATIONS: None PROCEDURE: Informed written consent was obtained from the patient after a discussion of the risks, benefits, and alternatives to treatment. Questions regarding the procedure were encouraged and answered. The right neck and chest were prepped with chlorhexidine in a sterile fashion, and a sterile drape was applied covering the operative field. Maximum barrier sterile technique with sterile gowns and gloves were used for the procedure. A timeout was performed prior to the initiation of the procedure. After creating a small venotomy incision, a micropuncture kit was utilized to access the right internal jugular vein under direct, real-time ultrasound guidance after the overlying soft tissues were anesthetized with 1% lidocaine with epinephrine. Ultrasound image documentation was performed. The microwire was marked to measure appropriate internal catheter length. External tunneled length was estimated. A total tip to cuff length of 19 cm was selected. Skin and subcutaneous tissues of chest wall below the clavicle were generously infiltrated with 1% lidocaine for local anesthesia. A small stab incision was made with 11 blade scalpel. The selected hemodialysis catheter was tunneled in a retrograde fashion from the anterior chest wall to the venotomy incision. A guidewire was advanced to the level of the IVC and the micropuncture  sheath was exchanged for a peel-away sheath. The catheter was then placed through the peel-away sheath with tips ultimately positioned within the superior aspect of the right atrium. Final catheter positioning was confirmed and documented with a spot radiographic image. The catheter aspirates and flushes normally. The catheter was flushed with appropriate volume heparin dwells. The catheter exit site was secured with a 0-Prolene retention suture. The venotomy incision was closed Derma bond and sterile dressing. Dressings were applied at the chest wall. Patient tolerated the procedure well and remained hemodynamically stable throughout. No complications were encountered and no significant blood loss encountered. IMPRESSION: Status post right IJ tunneled hemodialysis catheter placement. Catheter ready for use. Signed, Dulcy Fanny. Dellia Nims, RPVI [facility] Radiology Electronically Signed   By: Corrie Mckusick D.O.   On: 04/16/2019 16:28   Recent Labs    04/15/19 0459  WBC 8.3  HGB 7.9*  HCT 25.6*  PLT 210   Recent Labs    04/15/19 0459  NA 131*  K 4.4  CL 92*  CO2 21*  GLUCOSE 118*  BUN 108*  CREATININE 11.99*  CALCIUM 8.3*    Intake/Output Summary (Last 24 hours) at 04/17/2019 1021 Last data filed at 04/17/2019 0744 Gross per 24 hour  Intake 240 ml  Output --  Net 240 ml     Physical Exam: Vital Signs Blood pressure 110/62, pulse 70, temperature 98.4 F (36.9 C), temperature source Oral, resp. rate 18, height 4' 8"  (1.422 m), weight 67.3 kg, SpO2 99 %.  Constitutional: pt awake, appropriate, sitting up in bed, NAD HEENT:  conjugate gaze Neck: supple Cardiovascular: RRR- has R upper chest IJ catheter- covered with dressing Respiratory/Chest: CTA B/L- as above GI/Abdomen: soft, NT, ND, (+)BS Ext: no clubbing, cyanosis, or edema Psych: appropriate Musc: B/L BKAs- assessed R BKA this MA_ looks great- some dry  flaky skin, but no erythema  or drainage seen; (+) staples in tact- not assessed today Neuro: Ox3 Motor:  Bilateral lower extremities: Hip flexion, knee extension 5/5, unchanged  Assessment/Plan: 1. Functional deficits secondary to B/L BKAs due to gangrene/osteomyelitis which require 3+ hours per day of interdisciplinary therapy in a comprehensive inpatient rehab setting.  Physiatrist is providing close team supervision and 24 hour management of active medical problems listed below.  Physiatrist and rehab team continue to assess barriers to discharge/monitor patient progress toward functional and medical goals  Care Tool:  Bathing    Body parts bathed by patient: Right arm, Left arm, Chest, Abdomen, Face, Front perineal area, Right upper leg, Left upper leg, Buttocks   Body parts bathed by helper: Buttocks Body parts n/a: Right lower leg, Left lower leg   Bathing assist Assist Level: Set up assist     Upper Body Dressing/Undressing Upper body dressing   What is the patient wearing?: Pull over shirt    Upper body assist Assist Level: Independent    Lower Body Dressing/Undressing Lower body dressing      What is the patient wearing?: Underwear/pull up, Pants     Lower body assist Assist for lower body dressing: Set up assist     Toileting Toileting    Toileting assist Assist for toileting: Set up assist     Transfers Chair/bed transfer  Transfers assist     Chair/bed transfer assist level: Minimal Assistance - Patient > 75%     Locomotion Ambulation   Ambulation assist   Ambulation activity did not occur: Safety/medical concerns          Walk 10 feet activity   Assist  Walk 10 feet activity did not occur: Safety/medical concerns        Walk 50 feet activity   Assist Walk 50 feet with 2 turns activity did not occur: Safety/medical concerns         Walk 150 feet activity   Assist Walk 150 feet activity did not occur: Safety/medical concerns         Walk 10  feet on uneven surface  activity   Assist Walk 10 feet on uneven surfaces activity did not occur: Safety/medical concerns         Wheelchair     Assist Will patient use wheelchair at discharge?: Yes Type of Wheelchair: Manual    Wheelchair assist level: Independent Max wheelchair distance: 130f    Wheelchair 50 feet with 2 turns activity    Assist        Assist Level: Independent   Wheelchair 150 feet activity     Assist      Assist Level: Independent   Blood pressure 110/62, pulse 70, temperature 98.4 F (36.9 C), temperature source Oral, resp. rate 18, height 4' 8"  (1.422 m), weight 67.3 kg, SpO2 99 %.  Medical Problem List and Plan: 1.Impaired function/ADLs/mobilitysecondary to B/L BKAs due to osteomyelitis and gangrene- s/p IV ABX- completed 3/18  Continue CIR  2. Antithrombotics: -DVT/anticoagulation:Pharmaceutical:Heparindue to PD/ESRD -antiplatelet therapy:  3. Pain Management:Hydrocodone as needed- per pt, pain meds are suitable/working  4/4 increase gabapentin to 2024mqhs for phantom limb pain  4/5- pt reports pain adequately managed  4/9- will increase gabapentin to 300 mg QHS_ max dose can do - if doesn't work, can switch to ly[employer] Ils[employer] 4. Mood: LCSW to follow for evaluation and support -antipsychotic agents:  5. Neuropsych: This patientiscapable of making decisions onhis own behalf. 6. Skin/Wound Care:  Will need to monitor incisions closely given recent fall  4/5- BKAs look OK considering 2 falls  4/6- skin looking good- plans on wearing shrinkers today (didn't yesterday)  4/7- can take off shrinkers for a few hours max/day.  7. Fluids/Electrolytes/Nutrition:Strict I's and O's. Monitor weights daily after PD. 8.  ESRD: Continue CCPD daily.Is oliguric, not anuric- voids ~2x/day  4/7- need to go to Hemodialysis since going to SNF  4/8- going today for HD catheter- is NPO 9.Acute on chronic  anemia: On ESA weekly.   Hemoglobin 7.4 on 3/26, continue to monitor  3/31- Last Hb 7/8- will recheck tomorrow  4/1- Hb 7.5- stable  4/5- Hb 8.0- better but variable  4/7- Hb 7.9- con't meds  4/9- labs monday 10.Nutrition: Severe protein malnutrition-Renal/carb modified diet to continue with prostat due to low calorie/proteinmalnutrition. 11. OJJ:KKXFGHW blood pressures twice daily. Off hydralazine and furosemide at this time.   Con't clonidine and Norvasc   4/8- BP a little soft 105/60s- con't meds 12.T2DM with hyperglycemia: A1c 7.8-Continue Lantus daily--d/c meal coverage due to hypoglycemic episodes.Monitor blood sugars AC at bedtime for now due to hypoglycemia.   CBG (last 3)  Recent Labs    04/16/19 2057 04/17/19 0628 04/17/19 0908  GLUCAP 208* 133* 144*   Lantus decreased to 15 units on 3/27 (already received a.m. dose)  Labile with hypoglycemia on 3/28.  Will need to monitor closely overnight.  Bedtime snack ordered for tonight.   3/30- DM coord asked me to increase Lantus to 18 units-   4/3: cbgs remain elevated, labile. Treating and highs and lows which causes rebound effect  4/4 Lantus 20u daily started today, increase mealtime covg to 5u  4/8- BG a little low- is NPO this AM- if drops, will need IVFs D5NS  4/9- BGs labile due to being NPO most of yesterday- con't regimen 14. Leukocytosis: Resolved  3/30- WBC down to 8.9 from 13 yesterday 15. Hyperkalemia  3/30- up to 5.3- will see if renal will address since renal pt- if not, will try Lokelma.  3/31- Renal ordered Lokelma x1- wil recheck labs in AM  4/1- K+ down to 4.4- con't regimen 16. Constipation  3/31- will order Sorbitol 60 ml then follow in 3-4 hours with enema.   4/1- cleaned out- reminded pt to NOT refuse bowel meds  4/4 reiterating use/need of preventative bowel meds 17. LLQ Hematoma  4/2- will monitor hematoma- explained to try and avoid injecting into it with SQ Heparin- if not improving, will  order imaging next week.  4/5- is getting smaller- was small plum, now large grape size.  18. [name]  4/5- pt seems to think can go home, but is min assist for transfers, not Mod I or even supervision- team is trying to address.  4/6- pt emphatic that will have family at home- - won't entertain any other ideas.   4/8- going to SNF once gets a bed- and changing to HD  4/9- signed FL2- HD catheter placed- will see if needs to do here, or staying with PD while here. Will check with renal.   LOS: 17 days A FACE TO FACE EVALUATION WAS PERFORMED  Temika Sutphin 04/17/2019, 10:21 AM

## 2019-04-17 NOTE — Progress Notes (Signed)
Occupational Therapy Session Note  Patient Details  Name: Jorge [name] MRN: [id number] Date of Birth: July 19, 1970  Today's Date: 04/17/2019 OT Individual Time: 1115-1200 OT Individual Time Calculation (min): 45 min    Short Term Goals: Week 1:  OT Short Term Goal 1 (Week 1): Pt will transfer wiht CGA to BSC/toilet OT Short Term Goal 1 - Progress (Week 1): Met OT Short Term Goal 2 (Week 1): Pt will don pants wiht CGA using lateral leans OT Short Term Goal 2 - Progress (Week 1): Met OT Short Term Goal 3 (Week 1): Pt will manage pants prior to toileting wiht CGA OT Short Term Goal 3 - Progress (Week 1): Met  Skilled Therapeutic Interventions/Progress Updates:    1;1. Pt received in bed requesting inspection mirror. Pt completes doffing/donning B shrinkers and inspects incisions. Pt able to locate/identify small red spot on LLE. Pt completes slide board transfer set up to/from bed to w/c. Pt completes w/c mobility to outside courtyard and crosses street. Education on having family back pt down steep inclines to promote safety/decrease risk of falling forward out of chair. Pt able to push up incline iwht MIN A for power/strength d/t steep nature. Exited session with pt seated in bed, exit alram on and call light in reach  Therapy Documentation Precautions:  Precautions Precautions: Fall Precaution Comments: bliateral limb guards Restrictions Weight Bearing Restrictions: Yes RLE Weight Bearing: Non weight bearing LLE Weight Bearing: Non weight bearing General:   Vital Signs:   Pain:   ADL: ADL Eating: Independent Where Assessed-Eating: Bed level, Wheelchair Grooming: Modified independent Where Assessed-Grooming: Sitting at sink, Wheelchair Upper Body Bathing: Modified independent Where Assessed-Upper Body Bathing: Sitting at sink, Wheelchair Lower Body Bathing: Setup Where Assessed-Lower Body Bathing: Bed level Upper Body Dressing: Modified independent (Device) Where  Assessed-Upper Body Dressing: Wheelchair Lower Body Dressing: Setup Where Assessed-Lower Body Dressing: Bed level Toileting: Minimal assistance Where Assessed-Toileting: Bedside Commode Toilet Transfer: Minimal assistance, Contact guard Toilet Transfer Method: Theatre manager: Drop arm bedside commode ADL Comments: patient able to place bed pan and use urinal with set up if family not present to assist with transfer Vision   Perception    Praxis   Exercises:   Other Treatments:     Therapy/Group: Individual Therapy  Tonny Branch 04/17/2019, 12:05 PM

## 2019-04-17 NOTE — Progress Notes (Signed)
Occupational Therapy Session Note  Patient Details  Name: Jorge [name] MRN: [id number] Date of Birth: 10-18-1970  Today's Date: 04/17/2019 OT Individual Time: 5501-5868 OT Individual Time Calculation (min): 60 min    Short Term Goals: Week 1:  OT Short Term Goal 1 (Week 1): Pt will transfer wiht CGA to BSC/toilet OT Short Term Goal 1 - Progress (Week 1): Met OT Short Term Goal 2 (Week 1): Pt will don pants wiht CGA using lateral leans OT Short Term Goal 2 - Progress (Week 1): Met OT Short Term Goal 3 (Week 1): Pt will manage pants prior to toileting wiht CGA OT Short Term Goal 3 - Progress (Week 1): Met Week 2:  OT Short Term Goal 1 (Week 2): Patient will complete SB transfers to/from drop arm commode, bed and w/c with CS OT Short Term Goal 2 (Week 2): patient will complete toileting at drop arm commode with CS/set up  Skilled Therapeutic Interventions/Progress Updates:    1:1 Pt reported feeling a little off today after his HD port procedure yesterday. Pt participated in bathing and dressing; performing LB at bed level with setup of supplies. Pt then performed bed mobility mod I and transfer into the w/c via slide board (with ability to place his own board) with distant supervision. Pt performed UB bathing and dressing at sink level mod I. Pt able to don LE braces mod I but requires A to don the right Le rest on this w/c. Pt propelled self half way to the ADL apartment and discussed uneven transfers. Pt able to perform transfer with supervision (stabilizing w/c) w/c to couch with ability to place board. Pt required min A for uphill slide board transfer back into w/c. Pt self propelled back to room and transferred into bed with distant supervision. Left resting in bed.  Therapy Documentation Precautions:  Precautions Precautions: Fall Precaution Comments: bliateral limb guards Restrictions Weight Bearing Restrictions: Yes RLE Weight Bearing: Non weight bearing LLE Weight Bearing: Non  weight bearing General:   Vital Signs: Therapy Vitals Temp: 98.4 F (36.9 C) Temp Source: Oral Pulse Rate: 70 Resp: 18 BP: 110/62 Patient Position (if appropriate): Lying Oxygen Therapy SpO2: 99 % O2 Device: Room Air Pain: Pain Assessment Pain Scale: 0-10 Pain Score: 0-No pain   Therapy/Group: Individual Therapy  Willeen Cass [facility] 04/17/2019, 9:34 AM

## 2019-04-17 NOTE — Progress Notes (Signed)
Subjective:  SP IR Perm cath placement 4/08 HD tomorrow , Pt has no  Cos this am .   Objective Vital signs in last 24 hours: Vitals:   04/16/19 2155 04/17/19 0618 04/17/19 0720 04/17/19 1345  BP: (!) 105/47 (!) 106/59 110/62 (!) 104/48  Pulse:  70 70 70  Resp:  18 18 19   Temp:  98.4 F (36.9 C) 98.4 F (36.9 C) 98.2 F (36.8 C)  TempSrc:  Oral Oral   SpO2:  99%  99%  Weight:   67.3 kg   Height:       Weight change: -0.2 kg  Physical Exam: General: alert , Minimal vocal responses  Heart: RRR No m,r,g Lungs: clear bilaterally  Abdomen: soft non-tender; non-distended  Extremities: bilateral BKA; no stump edema  Dialysis Access: PD cath intact,  R IJ perm cath   OPDialysis Orders: CCPD 7 Days weekly 80 kg 4 exchanges 1.6 mls Dwell time 1.5 hr, No pause or day dwells. Uses mostly 2.5% solution  BMD meds: -Renvela 800 mg 3 tabs PO TID with meals -Calcitriol 0.5 mcg PO TIW (MWF) -   Problem/Plan: 1. Gangrene/ bilateral Osteo feet with severe PVD-s/p bilateral BKA 3/17. In CIR. Will d/c to SNF. 2. ESRD - Azotemic, suspected from protein intake and marginal PD Rx.  need to transition PD  to HD temporarily for discharge to SNF. For [facility] by IR 4/08 .  Renal coordinator notified for CLIP to outpatient center.   3. Hypertension/volume -  BP controlled. Amlodipine startedhere on3/13 Significantly under edw.  Does not appear volume overloaded. Will need new edw at d/c. Appears on nightly clonidine as outpt as well.With hD start fu bps with possibility of tapering med's  Down  4. Anemia - HGB initially 7.7 fell to 5.4on3/12 and transfused two units PRBCs. Heme neg 3/11 Ferritin still increased post surgery to 1420 with drop in tsat to 15% - s/p a short course of IV Fe (250 x 2) on Aranesp 200/ week - given qThurs. Transfuse as indicated.Note FOBT was checked and positive - per primary team. Pt states has hemorrhoids and no overt blood or dark stools per rectum. Last Hgb 7.9.  =(4/07) check in am pre hd  5. Metabolic bone disease -phos 6.3  improving on current regimen 6. DM - per PMD 7. Nutrition - Albumin low prostat, renal vits.-Renal/carb modified diet. Lactose intolerance noted.   Ernest Haber, PA-C [facility] Bee[facility] [phone] 04/17/2019,3:19 PM  LOS: 17 days   Labs: Basic Metabolic Panel: Recent Labs  Lab 04/13/19 0537 04/15/19 0459  NA 132* 131*  K 4.3 4.4  CL 94* 92*  CO2 21* 21*  GLUCOSE 126* 118*  BUN 106* 108*  CREATININE 12.26* 11.99*  CALCIUM 8.4* 8.3*  PHOS 5.5* 6.3*   Liver Function Tests: Recent Labs  Lab 04/13/19 0537 04/15/19 0459  ALBUMIN 1.7* 1.6*   No results for input(s): LIPASE, AMYLASE in the last 168 hours. No results for input(s): AMMONIA in the last 168 hours. CBC: Recent Labs  Lab 04/13/19 0537 04/15/19 0459  WBC 11.0* 8.3  NEUTROABS  --  4.8  HGB 8.0* 7.9*  HCT 25.9* 25.6*  MCV 96.6 96.2  PLT 222 210   Cardiac Enzymes: No results for input(s): CKTOTAL, CKMB, CKMBINDEX, TROPONINI in the last 168 hours. CBG: Recent Labs  Lab 04/16/19 1727 04/16/19 2057 04/17/19 0628 04/17/19 0908 04/17/19 1205  GLUCAP 92 208* 133* 144* 130*    Studies/Results: IR Fluoro Guide CV Line  Right  Result Date: 04/16/2019 INDICATION: 49 year old male with a history of end-stage renal disease referred for tunneled HD placement EXAM: TUNNELED CENTRAL VENOUS HEMODIALYSIS CATHETER PLACEMENT WITH ULTRASOUND AND FLUOROSCOPIC GUIDANCE MEDICATIONS: 2 g Ancef. The antibiotic was given in an appropriate time interval prior to skin puncture. ANESTHESIA/SEDATION: Moderate (conscious) sedation was employed during this procedure. A total of Versed 1.0 mg and Fentanyl 50 mcg was administered intravenously. Moderate Sedation Time: 19 minutes. The patient's level of consciousness and vital signs were monitored continuously by radiology nursing throughout the procedure under my direct supervision. FLUOROSCOPY TIME:   Fluoroscopy Time: 0 minutes 54 seconds (4 mGy). COMPLICATIONS: None PROCEDURE: Informed written consent was obtained from the patient after a discussion of the risks, benefits, and alternatives to treatment. Questions regarding the procedure were encouraged and answered. The right neck and chest were prepped with chlorhexidine in a sterile fashion, and a sterile drape was applied covering the operative field. Maximum barrier sterile technique with sterile gowns and gloves were used for the procedure. A timeout was performed prior to the initiation of the procedure. After creating a small venotomy incision, a micropuncture kit was utilized to access the right internal jugular vein under direct, real-time ultrasound guidance after the overlying soft tissues were anesthetized with 1% lidocaine with epinephrine. Ultrasound image documentation was performed. The microwire was marked to measure appropriate internal catheter length. External tunneled length was estimated. A total tip to cuff length of 19 cm was selected. Skin and subcutaneous tissues of chest wall below the clavicle were generously infiltrated with 1% lidocaine for local anesthesia. A small stab incision was made with 11 blade scalpel. The selected hemodialysis catheter was tunneled in a retrograde fashion from the anterior chest wall to the venotomy incision. A guidewire was advanced to the level of the IVC and the micropuncture sheath was exchanged for a peel-away sheath. The catheter was then placed through the peel-away sheath with tips ultimately positioned within the superior aspect of the right atrium. Final catheter positioning was confirmed and documented with a spot radiographic image. The catheter aspirates and flushes normally. The catheter was flushed with appropriate volume heparin dwells. The catheter exit site was secured with a 0-Prolene retention suture. The venotomy incision was closed Derma bond and sterile dressing. Dressings were  applied at the chest wall. Patient tolerated the procedure well and remained hemodynamically stable throughout. No complications were encountered and no significant blood loss encountered. IMPRESSION: Status post right IJ tunneled hemodialysis catheter placement. Catheter ready for use. Signed, Dulcy Fanny. Dellia Nims, RPVI [facility] Radiology Electronically Signed   By: Corrie Mckusick D.O.   On: 04/16/2019 16:28   IR US Guide Vasc Access Right  Result Date: 04/16/2019 INDICATION: 49 year old male with a history of end-stage renal disease referred for tunneled HD placement EXAM: TUNNELED CENTRAL VENOUS HEMODIALYSIS CATHETER PLACEMENT WITH ULTRASOUND AND FLUOROSCOPIC GUIDANCE MEDICATIONS: 2 g Ancef. The antibiotic was given in an appropriate time interval prior to skin puncture. ANESTHESIA/SEDATION: Moderate (conscious) sedation was employed during this procedure. A total of Versed 1.0 mg and Fentanyl 50 mcg was administered intravenously. Moderate Sedation Time: 19 minutes. The patient's level of consciousness and vital signs were monitored continuously by radiology nursing throughout the procedure under my direct supervision. FLUOROSCOPY TIME:  Fluoroscopy Time: 0 minutes 54 seconds (4 mGy). COMPLICATIONS: None PROCEDURE: Informed written consent was obtained from the patient after a discussion of the risks, benefits, and alternatives to treatment. Questions regarding the procedure were  encouraged and answered. The right neck and chest were prepped with chlorhexidine in a sterile fashion, and a sterile drape was applied covering the operative field. Maximum barrier sterile technique with sterile gowns and gloves were used for the procedure. A timeout was performed prior to the initiation of the procedure. After creating a small venotomy incision, a micropuncture kit was utilized to access the right internal jugular vein under direct, real-time ultrasound guidance  after the overlying soft tissues were anesthetized with 1% lidocaine with epinephrine. Ultrasound image documentation was performed. The microwire was marked to measure appropriate internal catheter length. External tunneled length was estimated. A total tip to cuff length of 19 cm was selected. Skin and subcutaneous tissues of chest wall below the clavicle were generously infiltrated with 1% lidocaine for local anesthesia. A small stab incision was made with 11 blade scalpel. The selected hemodialysis catheter was tunneled in a retrograde fashion from the anterior chest wall to the venotomy incision. A guidewire was advanced to the level of the IVC and the micropuncture sheath was exchanged for a peel-away sheath. The catheter was then placed through the peel-away sheath with tips ultimately positioned within the superior aspect of the right atrium. Final catheter positioning was confirmed and documented with a spot radiographic image. The catheter aspirates and flushes normally. The catheter was flushed with appropriate volume heparin dwells. The catheter exit site was secured with a 0-Prolene retention suture. The venotomy incision was closed Derma bond and sterile dressing. Dressings were applied at the chest wall. Patient tolerated the procedure well and remained hemodynamically stable throughout. No complications were encountered and no significant blood loss encountered. IMPRESSION: Status post right IJ tunneled hemodialysis catheter placement. Catheter ready for use. Signed, Dulcy Fanny. Dellia Nims, RPVI [facility] Inter[facility] Radiology Special[facility] Radiology Electronically Signed   By: Corrie Mckusick D.O.   On: 04/16/2019 16:28   Medications: . dialysis solution 1.5% low-MG/low-CA     . amLODipine  10 mg Oral Daily  . aspirin EC  81 mg Oral Daily  . atorvastatin  20 mg Oral q1800  . calcitRIOL  0.5 mcg Oral Once per day on Mon Wed Fri  . Chlorhexidine Gluconate Cloth  6 each Topical Daily   . cloNIDine  0.1 mg Oral QHS  . darbepoetin (ARANESP) injection - NON-DIALYSIS  200 mcg Subcutaneous Q Thu-1800  . docusate sodium  100 mg Oral BID  . feeding supplement (PRO-STAT SUGAR FREE 64)  30 mL Oral TID BM  . gabapentin  300 mg Oral QHS  . gentamicin cream  1 application Topical Daily  . insulin aspart  0-5 Units Subcutaneous QHS  . insulin aspart  0-6 Units Subcutaneous TID WC  . insulin aspart  5 Units Subcutaneous TID WC  . insulin glargine  20 Units Subcutaneous Daily  . multivitamin  1 tablet Oral QHS  . senna  1 tablet Oral BID  . sevelamer carbonate  3,200 mg Oral TID WC

## 2019-04-17 NOTE — Progress Notes (Signed)
Physical Therapy Session Note  Patient Details  Name: Jorge [name] MRN: [id number] Date of Birth: 12-10-1970  Today's Date: 04/17/2019 PT Individual Time: 1300-1345 PT Individual Time Calculation (min): 45 min   Short Term Goals: Week 3:  PT Short Term Goal 1 (Week 3): STG = LTG due to short ELOS.  Skilled Therapeutic Interventions/Progress Updates:   Pt received supine in bed and agreeable to PT. Supine>sit transfer without assist or cues. SB transfer to [facility] with supervision assist and min cues for UE placement to prevent board from sliding. Pt transported to entrance of [place]. WC mobility in simulated community environment over cement sidewalk 125f x 2 and through food court in atrium x 1046f Pt reports mild R UE pain following last bout of WC mobility. Pt returned to room and performed SB transfer to bed with supervision assist for safety. Sit>supine completed without assist, and left supine in bed with call bell in reach and all needs met.        Therapy Documentation Precautions:  Precautions Precautions: Fall Precaution Comments: bliateral limb guards Restrictions Weight Bearing Restrictions: Yes RLE Weight Bearing: Non weight bearing LLE Weight Bearing: Non weight bearing Vital Signs: Therapy Vitals Temp: 98.2 F (36.8 C) Pulse Rate: 70 Resp: 19 BP: (!) 104/48 Patient Position (if appropriate): Lying Oxygen Therapy SpO2: 99 % O2 Device: Room Air Pain: 3/10 R shoulder/ upper arm. Pt repositioned    Therapy/Group: Individual Therapy  AuLorie Phenix/09/2019, 1:46 PM

## 2019-04-17 NOTE — Progress Notes (Addendum)
Occupational Therapy Weekly Progress Note  Patient Details  Name: Jorge [name] MRN: [id number] Date of Birth: 1970/11/04  Beginning of progress report period: April 09, 2019 End of progress report period: April 17, 2019  Pt continues to work towards [employer]. Pt's d/c plan changed to SNF due to not having enough support at home to d/c. Pt recently received a HD port to switch to HD from PD in order to go to SNF.Pt cotinues to progress with transfers (even and uneven). Currently supervision for transfers, mod I for UB bathing and dressing and setup for LB bathing and dressing at bed level.   Patient continues to demonstrate the following deficits: muscle weakness, decreased cardiorespiratoy endurance and decreased sitting balance and decreased balance strategies and therefore will continue to benefit from skilled OT intervention to enhance overall performance with BADL and Reduce care partner burden.  Patient progressing toward long term goals..  Continue plan of care.  OT Short Term Goals Week 1:  OT Short Term Goal 1 (Week 1): Pt will transfer wiht CGA to BSC/toilet OT Short Term Goal 1 - Progress (Week 1): Met OT Short Term Goal 2 (Week 1): Pt will don pants wiht CGA using lateral leans OT Short Term Goal 2 - Progress (Week 1): Met OT Short Term Goal 3 (Week 1): Pt will manage pants prior to toileting wiht CGA OT Short Term Goal 3 - Progress (Week 1): Met Week 2:  OT Short Term Goal 1 (Week 2): Patient will complete SB transfers to/from drop arm commode, bed and w/c with CS OT Short Term Goal 1 - Progress (Week 2): Progressing toward goal OT Short Term Goal 2 (Week 2): patient will complete toileting at drop arm commode with CS/set up OT Short Term Goal 2 - Progress (Week 2): Progressing toward goal Week 3:  OT Short Term Goal 1 (Week 3): LTG=STG  Skilled Therapeutic Interventions/Progress Updates:    1:1  215-300pm  Continue to work on toilet transfers. Pt reported feeling very unsafe and  unstable on drop arm commode. Explored the padded tub bench with cut out as an option. Pt able to transfer on and off of it with supervision. As well as clothing management with lateral leans with supervision. Taken outside total A with time left. Continue to explore/ discuss d/c plans once he is able to go home.   Therapy Documentation Precautions:  Precautions Precautions: Fall Precaution Comments: bliateral limb guards Restrictions Weight Bearing Restrictions: Yes RLE Weight Bearing: Non weight bearing LLE Weight Bearing: Non weight bearing Pain: Pain Assessment Pain Scale: 0-10 Pain Score: 0-No pain   Therapy/Group: Individual Therapy  Willeen Cass [facility] 04/17/2019, 9:40 AM

## 2019-04-17 NOTE — Progress Notes (Signed)
Social Work Patient ID: Cecilie Kicks, male   DOB: [birth year], 49 y.o.   MRN: [id number]   SW returned call/left message for Terri Piedra [phone]) to inform on preferred SNF locations, and will follow-up once there is a bed offer.  SW received return phone call from [place] Cooper/Admissions with [facility] [phone]) stating will speak with corporate to review, and follow-up with SW.  *possible bed offer pending center location and dialysis days. SW left message for Colleen/Dialysis Coord. To inform on above. SW waiting on follow-up,.   SW spoke with Jaclyn Shaggy who reported will explore dialysis center options based on pt location (I.e. [facility]) and will follow-up with center location/date/times once secured. SW spoke with Tanya/[facility] to inform on above. SW met with pt in room to inform on above. SW indicated will follow-up once there is more information.   Loralee Pacas, MSW, [place] Office: [phone] Cell: [phone] Fax: [phone]

## 2019-04-17 NOTE — Progress Notes (Signed)
Recreational Therapy Session Note  Patient Details  Name: Jorge [name] MRN: [id number] Date of Birth: 05/07/1970 Today's Date: 04/17/2019 Time:  8088-1103 Pain: c/o pain where HD catheter placed Skilled Therapeutic Interventions/Progress Updates: session focused on discharge planning/coping with change in plans and its impact on rehab.  Pt states he is doing ok, feels pretty good physically but mostly frustrated with visitor restrictions as he transitions to a SNF.  Discussed options of staying connected with family and friends though phone call and video chat options.  Also discussed overall health and wellness with emphasis on emotional, social, psychological & spiritual wellness in addition to the physical.  Pt stated understanding and appreciation for todays visit.  Therapy/Group: Individual Therapy Katoria Yetman 04/17/2019, 7:59 AM

## 2019-04-18 ENCOUNTER — Inpatient Hospital Stay (HOSPITAL_COMMUNITY): Payer: Medicare Other | Admitting: Physical Therapy

## 2019-04-18 LAB — GLUCOSE, CAPILLARY
Glucose-Capillary: 99 mg/dL (ref 70–99)
Glucose-Capillary: 145 mg/dL — ABNORMAL HIGH (ref 70–99)
Glucose-Capillary: 156 mg/dL — ABNORMAL HIGH (ref 70–99)

## 2019-04-18 MED ORDER — AMLODIPINE BESYLATE 5 MG PO TABS
5.0000 mg | ORAL_TABLET | Freq: Every day | ORAL | Status: DC
  Administered 2019-04-19 – 2019-04-20 (×2): 5 mg via ORAL
  Filled 2019-04-18 (×2): qty 1

## 2019-04-18 MED ORDER — HEPARIN SODIUM (PORCINE) 1000 UNIT/ML IJ SOLN
INTRAMUSCULAR | Status: AC
  Filled 2019-04-18: qty 4

## 2019-04-18 NOTE — Progress Notes (Signed)
Physical Therapy Session Note  Patient Details  Name: Jorge [name] MRN: [id number] Date of Birth: 04-Apr-1970  Today's Date: 04/18/2019 PT Individual Time: 9030-0923 PT Individual Time Calculation (min): 25 min   Short Term Goals: Week 3:  PT Short Term Goal 1 (Week 3): STG = LTG due to short ELOS.  Skilled Therapeutic Interventions/Progress Updates: Pt presents sitting on bedside commode.  Pt requires multiple roll/lean on seat to assist w/ brief and shorts.  Pt performed lateral scoot transfer commode to w/c w/ supervision, PT holding w/c.  Occasional verbal cues for speed, and increased clearance from seat.  Pt placed bilateral limb guards w/ occasional assist for placement.  Pt informed that is going for dialysis.  Pt performed slide board transfer w/c > bed w/ supervision, occasional cueing for speed and safety.  Pt transfers sit to supine w/ left sidelying and use of side rails.  Pt left in bed and handed off to dialysis staff.      Therapy Documentation Precautions:  Precautions Precautions: Fall Precaution Comments: bliateral limb guards Restrictions Weight Bearing Restrictions: Yes RLE Weight Bearing: Non weight bearing LLE Weight Bearing: Non weight bearing General:   Vital Signs:   Pain: 0/10      Therapy/Group: Individual Therapy  [name] 04/18/2019, 2:26 PM

## 2019-04-18 NOTE — Progress Notes (Signed)
[facility] PHYSICAL MEDICINE & REHABILITATION PROGRESS NOTE   Subjective/Complaints: Feeling ok this morning. Feeling very sleepy.   ROS:  Pt denies SOB, abd pain, CP, N/V/C/D, and vision changes  Objective:   No results found. No results for input(s): WBC, HGB, HCT, PLT in the last 72 hours. No results for input(s): NA, K, CL, CO2, GLUCOSE, BUN, CREATININE, CALCIUM in the last 72 hours.  Intake/Output Summary (Last 24 hours) at 04/18/2019 1551 Last data filed at 04/18/2019 1300 Gross per 24 hour  Intake 720 ml  Output 475 ml  Net 245 ml     Physical Exam: Vital Signs Blood pressure 120/60, pulse 79, temperature 100.2 F (37.9 C), resp. rate 16, height 4\' 8"  (1.422 m), weight 67.3 kg, SpO2 98 %.  Constitutional: pt sleeping in bed, easily arousable HEENT:  conjugate gaze Neck: supple Cardiovascular: RRR- has R upper chest IJ catheter- covered with dressing Respiratory/Chest: CTA B/L- as above GI/Abdomen: soft, NT, ND, (+)BS Ext: no clubbing, cyanosis, or edema Psych: appropriate Musc: B/L BKAs- assessed R BKA this MA_ looks great- some dry flaky skin, but no erythema or drainage seen; (+) staples in tact- not assessed today Neuro: Ox3 Motor:  Bilateral lower extremities: Hip flexion, knee extension 5/5, unchanged  Assessment/Plan: 1. Functional deficits secondary to B/L BKAs due to gangrene/osteomyelitis which require 3+ hours per day of interdisciplinary therapy in a comprehensive inpatient rehab setting.  Physiatrist is providing close team supervision and 24 hour management of active medical problems listed below.  Physiatrist and rehab team continue to assess barriers to discharge/monitor patient progress toward functional and medical goals  Care Tool:  Bathing    Body parts bathed by patient: Right arm, Left arm, Chest, Abdomen, Face, Front perineal area, Right upper leg, Left upper leg, Buttocks   Body parts bathed by helper: Buttocks Body parts n/a: Right  lower leg, Left lower leg   Bathing assist Assist Level: Set up assist     Upper Body Dressing/Undressing Upper body dressing   What is the patient wearing?: Pull over shirt    Upper body assist Assist Level: Independent    Lower Body Dressing/Undressing Lower body dressing      What is the patient wearing?: Underwear/pull up, Pants     Lower body assist Assist for lower body dressing: Set up assist     Toileting Toileting    Toileting assist Assist for toileting: Set up assist     Transfers Chair/bed transfer  Transfers assist     Chair/bed transfer assist level: Supervision/Verbal cueing     Locomotion Ambulation   Ambulation assist   Ambulation activity did not occur: Safety/medical concerns          Walk 10 feet activity   Assist  Walk 10 feet activity did not occur: Safety/medical concerns        Walk 50 feet activity   Assist Walk 50 feet with 2 turns activity did not occur: Safety/medical concerns         Walk 150 feet activity   Assist Walk 150 feet activity did not occur: Safety/medical concerns         Walk 10 feet on uneven surface  activity   Assist Walk 10 feet on uneven surfaces activity did not occur: Safety/medical concerns         Wheelchair     Assist Will patient use wheelchair at discharge?: Yes Type of Wheelchair: Manual    Wheelchair assist level: Independent Max wheelchair distance: 114ft  Wheelchair 50 feet with 2 turns activity    Assist        Assist Level: Independent   Wheelchair 150 feet activity     Assist      Assist Level: Independent   Blood pressure 120/60, pulse 79, temperature 100.2 F (37.9 C), resp. rate 16, height 4\' 8"  (1.422 m), weight 67.3 kg, SpO2 98 %.  Medical Problem List and Plan: 1.Impaired function/ADLs/mobilitysecondary to B/L BKAs due to osteomyelitis and gangrene- s/p IV ABX- completed 3/18  Continue CIR  2.  Antithrombotics: -DVT/anticoagulation:Pharmaceutical:Heparindue to PD/ESRD -antiplatelet therapy:  3. Pain Management:Hydrocodone as needed- per pt, pain meds are suitable/working  4/4 increase gabapentin to 200mg  qhs for phantom limb pain  4/5- pt reports pain adequately managed  4/9- will increase gabapentin to 300 mg QHS. Max dose can do - if doesn't work, can switch to [employer].   4/10: Possibly more sleepy due to increased Gabapentin. Will continue to monitor.  4. Mood: LCSW to follow for evaluation and support -antipsychotic agents:  5. Neuropsych: This patientiscapable of making decisions onhis own behalf. 6. Skin/Wound Care:   Will need to monitor incisions closely given recent fall  4/5- BKAs look OK considering 2 falls  4/6- skin looking good- plans on wearing shrinkers today (didn't yesterday)  4/7- can take off shrinkers for a few hours max/day.  7. Fluids/Electrolytes/Nutrition:Strict I's and O's. Monitor weights daily after PD. 8.  ESRD: Continue CCPD daily.Is oliguric, not anuric- voids ~2x/day  4/7- need to go to Hemodialysis since going to SNF  4/8- going today for HD catheter- is NPO 9.Acute on chronic anemia: On ESA weekly.   Hemoglobin 7.4 on 3/26, continue to monitor  3/31- Last Hb 7/8- will recheck tomorrow  4/1- Hb 7.5- stable  4/5- Hb 8.0- better but variable  4/7- Hb 7.9- con't meds  4/9- labs monday 10.Nutrition: Severe protein malnutrition-Renal/carb modified diet to continue with prostat due to low calorie/proteinmalnutrition. 11. MLY:YTKPTWS blood pressures twice daily. Off hydralazine and furosemide at this time.   Con't clonidine and Norvasc   4/8- BP a little soft 105/60s- con't meds  4/10: BP improved 12.T2DM with hyperglycemia: A1c 7.8-Continue Lantus daily--d/c meal coverage due to hypoglycemic episodes.Monitor blood sugars AC at bedtime for now due to hypoglycemia.   CBG (last 3)  Recent Labs     04/17/19 2121 04/18/19 0611 04/18/19 1211  GLUCAP 149* 99 145*   Lantus decreased to 15 units on 3/27 (already received a.m. dose)  Labile with hypoglycemia on 3/28.  Will need to monitor closely overnight.  Bedtime snack ordered for tonight.   3/30- DM coord asked me to increase Lantus to 18 units-   4/3: cbgs remain elevated, labile. Treating and highs and lows which causes rebound effect  4/4 Lantus 20u daily started today, increase mealtime covg to 5u  4/8- BG a little low- is NPO this AM- if drops, will need IVFs D5NS  4/9- BGs labile due to being NPO most of yesterday- con't regimen  4/10: continue to be labile 14. Leukocytosis: Resolved  3/30- WBC down to 8.9 from 13 yesterday 15. Hyperkalemia  3/30- up to 5.3- will see if renal will address since renal pt- if not, will try Lokelma.  3/31- Renal ordered Lokelma x1- wil recheck labs in AM  4/1- K+ down to 4.4- con't regimen 16. Constipation  3/31- will order Sorbitol 60 ml then follow in 3-4 hours with enema.   4/1- cleaned out- reminded pt to NOT refuse bowel  meds  4/4 reiterating use/need of preventative bowel meds 17. LLQ Hematoma  4/2- will monitor hematoma- explained to try and avoid injecting into it with SQ Heparin- if not improving, will order imaging next week.  4/5- is getting smaller- was small plum, now large grape size.  18. [name]  4/5- pt seems to think can go home, but is min assist for transfers, not Mod I or even supervision- team is trying to address.  4/6- pt emphatic that will have family at home- - won't entertain any other ideas.   4/8- going to SNF once gets a bed- and changing to HD  4/9- signed FL2- HD catheter placed- will see if needs to do here, or staying with PD while here. Will check with renal.   LOS: 18 days A FACE TO FACE EVALUATION WAS PERFORMED  Clide Deutscher Ngina Royer 04/18/2019, 3:51 PM

## 2019-04-18 NOTE — Progress Notes (Signed)
Subjective:  No  Cos  for hd today   Objective Vital signs in last 24 hours: Vitals:   04/17/19 0618 04/17/19 0720 04/17/19 1345 04/17/19 2043  BP: (!) 106/59 110/62 (!) 104/48 120/60  Pulse: 70 70 70 79  Resp: 18 18 19 16   Temp: 98.4 F (36.9 C) 98.4 F (36.9 C) 98.2 F (36.8 C) 100.2 F (37.9 C)  TempSrc: Oral Oral    SpO2: 99%  99% 98%  Weight:  67.3 kg    Height:       Weight change: -1.1 kg  Physical Exam: General: alert , NAD ,AAM Heart: RRR No m,r,g Lungs: clear bilaterally  Abdomen: soft non-tender; non-distended  Extremities: bilateral BKA; no stump edema  Dialysis Access: PD cath intact,  R IJ perm cath   OPDialysis Orders: CCPD 7 Days weekly 80 kg 4 exchanges 1.6 mls Dwell time 1.5 hr, No pause or day dwells. Uses mostly 2.5% solution  BMD meds: -Renvela 800 mg 3 tabs PO TID with meals -Calcitriol 0.5 mcg PO TIW (MWF) -   Problem/Plan: 1. Gangrene/ bilateral Osteo feet with severe PVD-s/p bilateral BKA 3/17. In CIR. Will d/c to SNF. 2. ESRD - Azotemic, suspected from protein intake and marginal PD Rx.  need to transition PD  to HD temporarily for discharge to SNF.[place] by IR 4/08 .Renal coordinator notified for CLIP to outpatient center.  3. Hypertension/volume - BP controlled. Amlodipine 10mg   startedhere on3/13 Significantly under edw.  Bps lowish  Dc to 5mg  hs ,Does not appear volume overloaded. Will need new edw at d/c. Appears on nightly clonidine as outpt as well.With hD start fu bps with possibility of tapering med's  Down  4. Anemia - HGB initially 7.7 fell to 5.4on3/12 and transfused two units PRBCs. Heme neg 3/11 Ferritin still increased post surgery to 1420 with drop in tsat to 15% - s/p a short course of IV Fe (250 x 2) on Aranesp 200/ week - given qThurs. Transfuse as indicated.Note FOBT was checked and positive - per primary team. Pt states has hemorrhoids and no overt blood or dark stools per rectum. Last Hgb 7.9.=(4/07) check in  pre hd  5. Metabolic bone disease -phos 6.3  improving on current regimen 6. DM - per PMD 7. Nutrition - Albumin low prostat, renal vits.-Renal/carb modified diet. Lactose intolerance noted.    Ernest Haber, PA-C [facility] [phone] 04/18/2019,2:38 PM  LOS: 18 days   Labs: Basic Metabolic Panel: Recent Labs  Lab 04/13/19 0537 04/15/19 0459  NA 132* 131*  K 4.3 4.4  CL 94* 92*  CO2 21* 21*  GLUCOSE 126* 118*  BUN 106* 108*  CREATININE 12.26* 11.99*  CALCIUM 8.4* 8.3*  PHOS 5.5* 6.3*   Liver Function Tests: Recent Labs  Lab 04/13/19 0537 04/15/19 0459  ALBUMIN 1.7* 1.6*   No results for input(s): LIPASE, AMYLASE in the last 168 hours. No results for input(s): AMMONIA in the last 168 hours. CBC: Recent Labs  Lab 04/13/19 0537 04/15/19 0459  WBC 11.0* 8.3  NEUTROABS  --  4.8  HGB 8.0* 7.9*  HCT 25.9* 25.6*  MCV 96.6 96.2  PLT 222 210   Cardiac Enzymes: No results for input(s): CKTOTAL, CKMB, CKMBINDEX, TROPONINI in the last 168 hours. CBG: Recent Labs  Lab 04/17/19 1654 04/17/19 2045 04/17/19 2121 04/18/19 0611 04/18/19 1211  GLUCAP 138* 61* 149* 99 145*    Studies/Results: No results found. Medications: . dialysis solution 1.5% low-MG/low-CA     .  amLODipine  10 mg Oral Daily  . aspirin EC  81 mg Oral Daily  . atorvastatin  20 mg Oral q1800  . calcitRIOL  0.5 mcg Oral Once per day on Mon Wed Fri  . Chlorhexidine Gluconate Cloth  6 each Topical Daily  . cloNIDine  0.1 mg Oral QHS  . darbepoetin (ARANESP) injection - NON-DIALYSIS  200 mcg Subcutaneous Q Thu-1800  . docusate sodium  100 mg Oral BID  . feeding supplement (PRO-STAT SUGAR FREE 64)  30 mL Oral TID BM  . gabapentin  300 mg Oral QHS  . gentamicin cream  1 application Topical Daily  . insulin aspart  0-5 Units Subcutaneous QHS  . insulin aspart  0-6 Units Subcutaneous TID WC  . insulin aspart  5 Units Subcutaneous TID WC  . insulin glargine  20 Units  Subcutaneous Daily  . multivitamin  1 tablet Oral QHS  . senna  1 tablet Oral BID  . sevelamer carbonate  3,200 mg Oral TID WC

## 2019-04-19 ENCOUNTER — Inpatient Hospital Stay (HOSPITAL_COMMUNITY): Payer: Medicare Other | Admitting: Occupational Therapy

## 2019-04-19 ENCOUNTER — Inpatient Hospital Stay (HOSPITAL_COMMUNITY): Payer: Medicare Other | Admitting: Physical Therapy

## 2019-04-19 LAB — GLUCOSE, CAPILLARY
Glucose-Capillary: 131 mg/dL — ABNORMAL HIGH (ref 70–99)
Glucose-Capillary: 61 mg/dL — ABNORMAL LOW (ref 70–99)
Glucose-Capillary: 93 mg/dL (ref 70–99)
Glucose-Capillary: 128 mg/dL — ABNORMAL HIGH (ref 70–99)
Glucose-Capillary: 65 mg/dL — ABNORMAL LOW (ref 70–99)
Glucose-Capillary: 132 mg/dL — ABNORMAL HIGH (ref 70–99)

## 2019-04-19 NOTE — Progress Notes (Signed)
Occupational Therapy Session Note  Patient Details  Name: Jorge [name] MRN: [id number] Date of Birth: 16-Jun-1970  Today's Date: 04/19/2019 OT Individual Time: 4469-5072 OT Individual Time Calculation (min): 32 min    Short Term Goals: Week 3:  OT Short Term Goal 1 (Week 3): LTG=STG  Skilled Therapeutic Interventions/Progress Updates:    Treatment session with focus on functional transfers and w/c mobility in community setting.  Pt received semi-reclined in bed reporting fatigue but agreeable to therapy session.  Pt completed bed mobility Mod I and completed slide board transfer to w/c with supervision/setup of w/c, pt able to place slide board and complete transfer without physical assistance.  Pt donned limb guards and required assistance with donning Rt leg rest.  Pt propelled w/c >200' without assist and on/off elevators with min cues for alternative technique.  Pt able to propel w/c across uneven surfaces outside hospital.  Engaged in d/c planning and discussion about pt personal goals.  Pt reports need to toilet, therefore returned to room with assist from therapist for time.  Pt completed transfer back on to bed with supervision, pt placing slide board.  Pt then placed self on bedpan, despite encouragement to utilize drop arm BSC or padded tub bench with toilet cutout.  Pt left with all needs in reach and encouraged to call nursing staff when finished.  Therapy Documentation Precautions:  Precautions Precautions: Fall Precaution Comments: bliateral limb guards Restrictions Weight Bearing Restrictions: Yes RLE Weight Bearing: Non weight bearing LLE Weight Bearing: Non weight bearing Pain: Pt with no c/o pain   Therapy/Group: Individual Therapy  Simonne Come 04/19/2019, 3:16 PM

## 2019-04-19 NOTE — Significant Event (Signed)
Hypoglycemic Event  CBG: 65  Treatment: 8 oz juice/soda  Symptoms: None  Follow-up CBG: Time:1827 CBG Result:128  Possible Reasons for Event: Unknown  Comments/MD notified: Dinner tray arrived at the time blood sugar was checked and meal was consumed. No sliding scale insulin given. No meal coverage insulin given.    Creig Hines, Susa Raring

## 2019-04-19 NOTE — Progress Notes (Signed)
Subjective:  Tolerated HD yest . Will change to MWF 2/2 pt load on tts  Objective Vital signs in last 24 hours: Vitals:   04/17/19 1345 04/17/19 2043 04/18/19 2004 04/19/19 0545  BP: (!) 104/48 120/60 (!) 127/55 (!) 119/57  Pulse: 70 79 91 78  Resp: 19 16 16 15   Temp: 98.2 F (36.8 C) 100.2 F (37.9 C) 98.2 F (36.8 C) 98 F (36.7 C)  TempSrc:   Oral   SpO2: 99% 98% 98% 100%  Weight:    70.5 kg  Height:       Weight change: 3.2 kg  Physical Exam: General:alert , NAD ,AAM Heart: RRR No m,r,g Lungs: clear bilaterally  Abdomen: soft non-tender; non-distended  Extremities: bilateral BKA; no stump edema  Dialysis Access: PD cath intact, R IJ perm cath  OPDialysis Orders: CCPD 7 Days weekly 80 kg 4 exchanges 1.6 mls Dwell time 1.5 hr, No pause or day dwells. Uses mostly 2.5% solution  BMD meds: -Renvela 800 mg 3 tabs PO TID with meals -Calcitriol 0.5 mcg PO TIW (MWF) -   Problem/Plan: 1. Gangrene/ bilateral Osteo feet with severe PVD-s/p bilateral BKA 3/17. In CIR.For  d/c to SNF. 2. ESRD - Azotemic, suspected from protein intake and marginal PD Rx.  transition PDto HD temporarily for discharge to SNF.[place] by IR4/08.Renal coordinator notified for CLIP to outpatient center.  3. Hypertension/volume - BP controlled. Amlodipine 10mg   startedhere on3/13 Significantly under edw.  Bps lowish  Dc to 5mg  hs ,Does not appear volume overloaded. Will need new edw at d/c. Appears on nightly clonidine as outpt as well.With hD start fu bps with possibility of tapering med's Down  4. Anemia - HGB initially 7.7 fell to 5.4on3/12 and transfused two units PRBCs. Heme neg 3/11 Ferritin still increased post surgery to 1420 with drop in tsat to 15% - s/p a short course of IV Fe (250 x 2) on Aranesp 200/ week - given qThurs. Transfuse as indicated.Note FOBT was checked and positive - per primary team. Pt states has hemorrhoids and no overt blood or dark stools per  rectum.LastHgb 7.9.=(4/07) check in pre hd 5. Metabolic bone disease -[id number] on current regimen 6. DM - per PMD 7. Nutrition - Albumin low prostat, renal vits.-Renal/carb modified diet. Lactose intolerance noted.   Ernest Haber, PA-C [facility] [phone] 04/19/2019,3:05 PM  LOS: 19 days   Labs: Basic Metabolic Panel: Recent Labs  Lab 04/13/19 0537 04/15/19 0459  NA 132* 131*  K 4.3 4.4  CL 94* 92*  CO2 21* 21*  GLUCOSE 126* 118*  BUN 106* 108*  CREATININE 12.26* 11.99*  CALCIUM 8.4* 8.3*  PHOS 5.5* 6.3*   Liver Function Tests: Recent Labs  Lab 04/13/19 0537 04/15/19 0459  ALBUMIN 1.7* 1.6*   No results for input(s): LIPASE, AMYLASE in the last 168 hours. No results for input(s): AMMONIA in the last 168 hours. CBC: Recent Labs  Lab 04/13/19 0537 04/15/19 0459  WBC 11.0* 8.3  NEUTROABS  --  4.8  HGB 8.0* 7.9*  HCT 25.9* 25.6*  MCV 96.6 96.2  PLT 222 210   Cardiac Enzymes: No results for input(s): CKTOTAL, CKMB, CKMBINDEX, TROPONINI in the last 168 hours. CBG: Recent Labs  Lab 04/18/19 1211 04/18/19 2107 04/19/19 0608 04/19/19 1145 04/19/19 1229  GLUCAP 145* 156* 131* 61* 93    Studies/Results: No results found. Medications: . dialysis solution 1.5% low-MG/low-CA     . amLODipine  5 mg Oral QHS  . aspirin EC  81 mg Oral Daily  . atorvastatin  20 mg Oral q1800  . calcitRIOL  0.5 mcg Oral Once per day on Mon Wed Fri  . Chlorhexidine Gluconate Cloth  6 each Topical Daily  . cloNIDine  0.1 mg Oral QHS  . darbepoetin (ARANESP) injection - NON-DIALYSIS  200 mcg Subcutaneous Q Thu-1800  . docusate sodium  100 mg Oral BID  . feeding supplement (PRO-STAT SUGAR FREE 64)  30 mL Oral TID BM  . gabapentin  300 mg Oral QHS  . gentamicin cream  1 application Topical Daily  . insulin aspart  0-5 Units Subcutaneous QHS  . insulin aspart  0-6 Units Subcutaneous TID WC  . insulin aspart  5 Units Subcutaneous TID  WC  . insulin glargine  20 Units Subcutaneous Daily  . multivitamin  1 tablet Oral QHS  . senna  1 tablet Oral BID  . sevelamer carbonate  3,200 mg Oral TID WC

## 2019-04-19 NOTE — Progress Notes (Addendum)
[facility] PHYSICAL MEDICINE & REHABILITATION PROGRESS NOTE   Subjective/Complaints: Sleeping soundly. Has no complaints.   ROS:  Pt denies SOB, abd pain, CP, N/V/C/D, and vision changes  Objective:   No results found. No results for input(s): WBC, HGB, HCT, PLT in the last 72 hours. No results for input(s): NA, K, CL, CO2, GLUCOSE, BUN, CREATININE, CALCIUM in the last 72 hours.  Intake/Output Summary (Last 24 hours) at 04/19/2019 1525 Last data filed at 04/19/2019 1433 Gross per 24 hour  Intake 120 ml  Output 300 ml  Net -180 ml     Physical Exam: Vital Signs Blood pressure (!) 119/57, pulse 78, temperature 98 F (36.7 C), resp. rate 15, height 4\' 8"  (1.422 m), weight 70.5 kg, SpO2 100 %.  Constitutional: Patient is sleeping comfortably HEENT:  conjugate gaze Neck: supple Cardiovascular: RRR- has R upper chest IJ catheter- covered with dressing Respiratory/Chest: CTA B/L- as above GI/Abdomen: soft, NT, ND, (+)BS Ext: no clubbing, cyanosis, or edema Psych: appropriate Musc: B/L BKAs- assessed R BKA this MA_ looks great- some dry flaky skin, but no erythema or drainage seen; (+) staples in tact- not assessed today Neuro: Ox3 Motor:  Bilateral lower extremities: Hip flexion, knee extension 5/5, unchanged  Assessment/Plan: 1. Functional deficits secondary to B/L BKAs due to gangrene/osteomyelitis which require 3+ hours per day of interdisciplinary therapy in a comprehensive inpatient rehab setting.  Physiatrist is providing close team supervision and 24 hour management of active medical problems listed below.  Physiatrist and rehab team continue to assess barriers to discharge/monitor patient progress toward functional and medical goals  Care Tool:  Bathing    Body parts bathed by patient: Right arm, Left arm, Chest, Abdomen, Face, Front perineal area, Right upper leg, Left upper leg, Buttocks   Body parts bathed by helper: Buttocks Body parts n/a: Right lower leg,  Left lower leg   Bathing assist Assist Level: Set up assist     Upper Body Dressing/Undressing Upper body dressing   What is the patient wearing?: Pull over shirt    Upper body assist Assist Level: Independent    Lower Body Dressing/Undressing Lower body dressing      What is the patient wearing?: Underwear/pull up, Pants     Lower body assist Assist for lower body dressing: Set up assist     Toileting Toileting    Toileting assist Assist for toileting: Set up assist     Transfers Chair/bed transfer  Transfers assist     Chair/bed transfer assist level: Supervision/Verbal cueing     Locomotion Ambulation   Ambulation assist   Ambulation activity did not occur: Safety/medical concerns          Walk 10 feet activity   Assist  Walk 10 feet activity did not occur: Safety/medical concerns        Walk 50 feet activity   Assist Walk 50 feet with 2 turns activity did not occur: Safety/medical concerns         Walk 150 feet activity   Assist Walk 150 feet activity did not occur: Safety/medical concerns         Walk 10 feet on uneven surface  activity   Assist Walk 10 feet on uneven surfaces activity did not occur: Safety/medical concerns         Wheelchair     Assist Will patient use wheelchair at discharge?: Yes Type of Wheelchair: Manual    Wheelchair assist level: Independent Max wheelchair distance: 39'    Wheelchair  50 feet with 2 turns activity    Assist        Assist Level: Independent   Wheelchair 150 feet activity     Assist      Assist Level: Independent   Blood pressure (!) 119/57, pulse 78, temperature 98 F (36.7 C), resp. rate 15, height 4\' 8"  (1.422 m), weight 70.5 kg, SpO2 100 %.  Medical Problem List and Plan: 1.Impaired function/ADLs/mobilitysecondary to B/L BKAs due to osteomyelitis and gangrene- s/p IV ABX- completed 3/18  Continue CIR  2.  Antithrombotics: -DVT/anticoagulation:Pharmaceutical:Heparindue to PD/ESRD -antiplatelet therapy:  3. Pain Management:Hydrocodone as needed- per pt, pain meds are suitable/working  4/4 increase gabapentin to 200mg  qhs for phantom limb pain  4/5- pt reports pain adequately managed  4/9- will increase gabapentin to 300 mg QHS. Max dose can do - if doesn't work, can switch to [employer].   4/10: Possibly more sleepy due to increased Gabapentin. Will continue to monitor.  4. Mood: LCSW to follow for evaluation and support -antipsychotic agents:  5. Neuropsych: This patientiscapable of making decisions onhis own behalf. 6. Skin/Wound Care:   Will need to monitor incisions closely given recent fall  4/5- BKAs look OK considering 2 falls  4/6- skin looking good- plans on wearing shrinkers today (didn't yesterday)  4/7- can take off shrinkers for a few hours max/day.  7. Fluids/Electrolytes/Nutrition:Strict I's and O's. Monitor weights daily after PD. 8.  ESRD: Continue CCPD daily.Is oliguric, not anuric- voids ~2x/day  4/7- need to go to Hemodialysis since going to SNF  4/8- going today for HD catheter- is NPO 9.Acute on chronic anemia: On ESA weekly.   Hemoglobin 7.4 on 3/26, continue to monitor  3/31- Last Hb 7/8- will recheck tomorrow  4/1- Hb 7.5- stable  4/5- Hb 8.0- better but variable  4/7- Hb 7.9- con't meds  4/9- labs monday 10.Nutrition: Severe protein malnutrition-Renal/carb modified diet to continue with prostat due to low calorie/proteinmalnutrition. 11. UXL:KGMWNUU blood pressures twice daily. Off hydralazine and furosemide at this time.   Con't clonidine and Norvasc   4/8- BP a little soft 105/60s- con't meds  4/10: BP improved, 4/11: still a little soft.  12.T2DM with hyperglycemia: A1c 7.8-Continue Lantus daily--d/c meal coverage due to hypoglycemic episodes.Monitor blood sugars AC at bedtime for now due to hypoglycemia.   CBG  (last 3)  Recent Labs    04/19/19 0608 04/19/19 1145 04/19/19 1229  GLUCAP 131* 61* 93   Lantus decreased to 15 units on 3/27 (already received a.m. dose)  Labile with hypoglycemia on 3/28.  Will need to monitor closely overnight.  Bedtime snack ordered for tonight.   3/30- DM coord asked me to increase Lantus to 18 units-   4/3: cbgs remain elevated, labile. Treating and highs and lows which causes rebound effect  4/4 Lantus 20u daily started today, increase mealtime covg to 5u  4/8- BG a little low- is NPO this AM- if drops, will need IVFs D5NS  4/9- BGs labile due to being NPO most of yesterday- con't regimen  4/10: continue to be labile  4/11: well controlled 14. Leukocytosis: Resolved  3/30- WBC down to 8.9 from 13 yesterday 15. Hyperkalemia  3/30- up to 5.3- will see if renal will address since renal pt- if not, will try Lokelma.  3/31- Renal ordered Lokelma x1- wil recheck labs in AM  4/1- K+ down to 4.4- con't regimen 16. Constipation  3/31- will order Sorbitol 60 ml then follow in 3-4 hours with enema.  4/1- cleaned out- reminded pt to NOT refuse bowel meds  4/4 reiterating use/need of preventative bowel meds 17. LLQ Hematoma  4/2- will monitor hematoma- explained to try and avoid injecting into it with SQ Heparin- if not improving, will order imaging next week.  4/5- is getting smaller- was small plum, now large grape size.  18. [name]  4/5- pt seems to think can go home, but is min assist for transfers, not Mod I or even supervision- team is trying to address.  4/6- pt emphatic that will have family at home- - won't entertain any other ideas.   4/8- going to SNF once gets a bed- and changing to HD  4/9- signed FL2- HD catheter placed- will see if needs to do here, or staying with PD while here. Will check with renal.   4/11: appreciate renal note: transitioned to HD temporarily in preparation for DC to SNF  LOS: 19 days A FACE TO FACE EVALUATION WAS  [place] 04/19/2019, 3:25 PM

## 2019-04-19 NOTE — Progress Notes (Signed)
Physical Therapy Session Note  Patient Details  Name: Jorge [name] MRN: [id number] Date of Birth: [birth year]  Today's Date: 04/19/2019 PT Individual Time: 0810-0840 PT Individual Time Calculation (min): 30 min   Short Term Goals: Week 3:  PT Short Term Goal 1 (Week 3): STG = LTG due to short ELOS.  Skilled Therapeutic Interventions/Progress Updates:  Pt presents supine in bed and agreeable to partiucicipate w/ therapy early.  Pt transfers sup to sit w/ CGA and verbal cueing for log roll.  Pt sat EOB x 2' to don new shirt w/ set-up.  Pt performed slide board transfers to right bed > w/c w/ supervision.  Pt negotiated w/c in hallways up to 100' per trial.  Pt performed slide board transfer w/c <> car w/ supervision, but occasional verbal cues for improved safety and independence.  Pt negotiated w/c 100' to ADL room.  Pt refuses transfer w/c > couch.  Discussed mechanics of transfer to all surfaces, but still politely refuses.  Pt transfers w/ slide board and supervision w/c > bed to left w/ proper speed and safety.  Pt left supine in bed w/ bed alarm on and all needs in reach.     Therapy Documentation Precautions:  Precautions Precautions: Fall Precaution Comments: bliateral limb guards Restrictions Weight Bearing Restrictions: Yes RLE Weight Bearing: Non weight bearing LLE Weight Bearing: Non weight bearing General:   Vital Signs:  Pain:0/10 Pain Assessment Pain Scale: 0-10 Pain Score: 0-No pain Mobility:      Therapy/Group: Individual Therapy  [name] 04/19/2019, 10:42 AM

## 2019-04-19 NOTE — Significant Event (Signed)
Hypoglycemic Event  CBG: 61  Treatment: 4 oz juice/soda  Symptoms: None  Follow-up CBG: Time:1229 CBG Result:93  Possible Reasons for Event: Medication regimen: meal coverage insulin is 5 units.  Comments/MD notified:Lunch tray came onto unit immediately after checking blood glucose.    Creig Hines, Susa Raring

## 2019-04-20 ENCOUNTER — Inpatient Hospital Stay (HOSPITAL_COMMUNITY): Payer: Medicare Other | Admitting: Occupational Therapy

## 2019-04-20 ENCOUNTER — Inpatient Hospital Stay (HOSPITAL_COMMUNITY): Payer: Medicare Other

## 2019-04-20 LAB — CBC WITH DIFFERENTIAL/PLATELET
Abs Immature Granulocytes: 0.07 10*3/uL (ref 0.00–0.07)
Basophils Absolute: 0.1 10*3/uL (ref 0.0–0.1)
Basophils Relative: 1 %
Eosinophils Absolute: 0.5 10*3/uL (ref 0.0–0.5)
Eosinophils Relative: 6 %
HCT: 27.6 % — ABNORMAL LOW (ref 39.0–52.0)
Hemoglobin: 8.4 g/dL — ABNORMAL LOW (ref 13.0–17.0)
Immature Granulocytes: 1 %
Lymphocytes Relative: 18 %
Lymphs Abs: 1.7 10*3/uL (ref 0.7–4.0)
MCH: 29 pg (ref 26.0–34.0)
MCHC: 30.4 g/dL (ref 30.0–36.0)
MCV: 95.2 fL (ref 80.0–100.0)
Monocytes Absolute: 1 10*3/uL (ref 0.1–1.0)
Monocytes Relative: 10 %
Neutro Abs: 6 10*3/uL (ref 1.7–7.7)
Neutrophils Relative %: 64 %
Platelets: 261 10*3/uL (ref 150–400)
RBC: 2.9 MIL/uL — ABNORMAL LOW (ref 4.22–5.81)
RDW: 14.8 % (ref 11.5–15.5)
WBC: 9.4 10*3/uL (ref 4.0–10.5)
nRBC: 0 % (ref 0.0–0.2)

## 2019-04-20 LAB — RENAL FUNCTION PANEL
Albumin: 1.7 g/dL — ABNORMAL LOW (ref 3.5–5.0)
Anion gap: 18 — ABNORMAL HIGH (ref 5–15)
BUN: 94 mg/dL — ABNORMAL HIGH (ref 6–20)
CO2: 21 mmol/L — ABNORMAL LOW (ref 22–32)
Calcium: 8.5 mg/dL — ABNORMAL LOW (ref 8.9–10.3)
Chloride: 96 mmol/L — ABNORMAL LOW (ref 98–111)
Creatinine, Ser: 10.06 mg/dL — ABNORMAL HIGH (ref 0.61–1.24)
GFR calc Af Amer: 6 mL/min — ABNORMAL LOW (ref >60)
GFR calc non Af Amer: 5 mL/min — ABNORMAL LOW (ref >60)
Glucose, Bld: 105 mg/dL — ABNORMAL HIGH (ref 70–99)
Phosphorus: 6.5 mg/dL — ABNORMAL HIGH (ref 2.5–4.6)
Potassium: 4.6 mmol/L (ref 3.5–5.1)
Sodium: 135 mmol/L (ref 135–145)

## 2019-04-20 LAB — GLUCOSE, CAPILLARY
Glucose-Capillary: 106 mg/dL — ABNORMAL HIGH (ref 70–99)
Glucose-Capillary: 193 mg/dL — ABNORMAL HIGH (ref 70–99)
Glucose-Capillary: 69 mg/dL — ABNORMAL LOW (ref 70–99)
Glucose-Capillary: 88 mg/dL (ref 70–99)
Glucose-Capillary: 81 mg/dL (ref 70–99)
Glucose-Capillary: 155 mg/dL — ABNORMAL HIGH (ref 70–99)

## 2019-04-20 LAB — OCCULT BLOOD X 1 CARD TO LAB, STOOL: Fecal Occult Bld: NEGATIVE

## 2019-04-20 MED ORDER — HEPARIN SODIUM (PORCINE) 1000 UNIT/ML IJ SOLN
INTRAMUSCULAR | Status: AC
  Administered 2019-04-20: 1000 [IU]
  Filled 2019-04-20: qty 4

## 2019-04-20 MED ORDER — CHLORHEXIDINE GLUCONATE CLOTH 2 % EX PADS
6.0000 | MEDICATED_PAD | Freq: Two times a day (BID) | CUTANEOUS | Status: DC
  Administered 2019-04-20 – 2019-04-24 (×8): 6 via TOPICAL

## 2019-04-20 NOTE — Progress Notes (Signed)
Physical Therapy Session Note  Patient Details  Name: Jorge [name] MRN: [id number] Date of Birth: 1970/10/31  Today's Date: 04/20/2019 PT Individual Time: 1100-1200 PT Individual Time Calculation (min): 60 min   Short Term Goals: Week 1:  PT Short Term Goal 1 (Week 1): STG = LTG due to short ELOS. Week 2:  PT Short Term Goal 1 (Week 2): STG = LTG due to short ELOS. Week 3:  PT Short Term Goal 1 (Week 3): STG = LTG due to short ELOS.  Skilled Therapeutic Interventions/Progress Updates:    PAIN  Denies pain  Pt initially OOB completing paperwork for DC per pt.  Pt requested assist w/filling out forms/agreed to assist during session while pt performed mat therex.   Pt propells wc to gym >150 mod I. Mat to wc w/cues for set up and close supervision.    Seated LAQs 2x15 Seated push up blocks 2x8  Sit to supine to prone mod I, repositioned for comfort w/R chest line. (therapist read Application for Health Coverage form and filled in pt responses during therex)    Prone hip extension 2x10 Prone HS curls 2x10  Sidelying hip abdx 20 each Supine quad sets x 20 Supine SLR x 20 Supine glut sets x20  Supine to sit to scoot to edge of mat w/supervision. Pt sat at edge of mat w/supervision and signed two pages of above form, agreed to read in full before completing.  Mat to wc SBT w/supervision.  wc to room mod I via wc.  Pt left oob in wc w/ needs in reach   Therapy Documentation Precautions:  Precautions Precautions: Fall Precaution Comments: bliateral limb guards Restrictions Weight Bearing Restrictions: Yes RLE Weight Bearing: Non weight bearing LLE Weight Bearing: Non weight bearing    Therapy/Group: Individual Therapy  Callie Fielding, [place] 04/20/2019, 12:48 PM

## 2019-04-20 NOTE — Progress Notes (Signed)
Social Work Patient ID: Cecilie Kicks, male   DOB: [birth year], 49 y.o.   MRN: [id number]   SW left message for [facility] Coordinator [phone]) to follow-up about a dialysis center location for pt. SW waiting on follow-up.   SW spoke with Lavella Lemons Cooper/Admissions with [facility] [phone]) to inform on above, and SW will follow-up once there is more information.  *SW received updates from [facility] indicating still waiting on follow-up from the dialysis center, and will follow-up once there are updates.   Loralee Pacas, MSW, [place] Office: [phone] Cell: [phone] Fax: [phone]

## 2019-04-20 NOTE — Progress Notes (Signed)
Occupational Therapy Session Note  Patient Details  Name: Jorge [name] MRN: [id number] Date of Birth: [birth year]  Today's Date: 04/20/2019 OT Individual Time: 4497-5300 OT Individual Time Calculation (min): 57 min  Patient missed PM session due to new HD schedule - will attempt to make up as appropriate  Short Term Goals: Week 3:  OT Short Term Goal 1 (Week 3): LTG=STG  Skilled Therapeutic Interventions/Progress Updates:    AM session:   Patient in bed, alert, denies pain.   States that he had a bad weekend due to ongoing bowel issues.  Denies need to void at this time.  Supine to sitting edge of bed with CS, increased effort to achieve sitting position.  SB transfer bed to w/c with set up of w/c, he is able to place board and complete transfer with CS.  UB bathing, dressing and grooming completed w/c level with set up.  Patient propels w/c on unit 150 feet x3.  He completed outdoor w/c mobility on uneven surfaces with CS.  He remained seated in w/c at close of session.  Call bell and tray table in reach.       Therapy Documentation Precautions:  Precautions Precautions: Fall Precaution Comments: bliateral limb guards Restrictions Weight Bearing Restrictions: Yes RLE Weight Bearing: Non weight bearing LLE Weight Bearing: Non weight bearing   Therapy/Group: Individual Therapy  Carlos Levering 04/20/2019, 7:37 AM

## 2019-04-20 NOTE — Progress Notes (Signed)
Patient ID: Cecilie Kicks, male   DOB: [birth year], 49 y.o.   MRN: [id number] S: Feels well, no complaints. O:BP 122/60 (BP Location: Left Arm)   Pulse 68   Temp (!) 97.5 F (36.4 C) (Oral)   Resp 17   Ht 4\' 8"  (1.422 m)   Wt 68.5 kg   SpO2 100%   BMI 33.86 kg/m   Intake/Output Summary (Last 24 hours) at 04/20/2019 1131 Last data filed at 04/20/2019 0900 Gross per 24 hour  Intake 480 ml  Output 525 ml  Net -45 ml   Intake/Output: I/O last 3 completed shifts: In: 360 [P.O.:360] Out: 525 [Urine:525]  Intake/Output this shift:  Total I/O In: 240 [P.O.:240] Out: -  Weight change: -2 kg Gen: NAD CVS: no rub Resp: cta Abd: +BS, soft, NT/ND, PD catheter in LLQ Ext: no edema, s/p bilateral BKA's  Recent Labs  Lab 04/15/19 0459 04/20/19 0537  NA 131* 135  K 4.4 4.6  CL 92* 96*  CO2 21* 21*  GLUCOSE 118* 105*  BUN 108* 94*  CREATININE 11.99* 10.06*  ALBUMIN 1.6* 1.7*  CALCIUM 8.3* 8.5*  PHOS 6.3* 6.5*   Liver Function Tests: Recent Labs  Lab 04/15/19 0459 04/20/19 0537  ALBUMIN 1.6* 1.7*   No results for input(s): LIPASE, AMYLASE in the last 168 hours. No results for input(s): AMMONIA in the last 168 hours. CBC: Recent Labs  Lab 04/15/19 0459 04/20/19 0537  WBC 8.3 9.4  NEUTROABS 4.8 6.0  HGB 7.9* 8.4*  HCT 25.6* 27.6*  MCV 96.2 95.2  PLT 210 261   Cardiac Enzymes: No results for input(s): CKTOTAL, CKMB, CKMBINDEX, TROPONINI in the last 168 hours. CBG: Recent Labs  Lab 04/19/19 1229 04/19/19 1737 04/19/19 1827 04/19/19 2135 04/20/19 0620  GLUCAP 93 65* 128* 132* 106*    Iron Studies: No results for input(s): IRON, TIBC, TRANSFERRIN, FERRITIN in the last 72 hours. Studies/Results: No results found. Marland Kitchen amLODipine  5 mg Oral QHS  . aspirin EC  81 mg Oral Daily  . atorvastatin  20 mg Oral q1800  . calcitRIOL  0.5 mcg Oral Once per day on Mon Wed Fri  . Chlorhexidine Gluconate Cloth  6 each Topical Q12H  . cloNIDine  0.1 mg Oral QHS  .  darbepoetin (ARANESP) injection - NON-DIALYSIS  200 mcg Subcutaneous Q Thu-1800  . docusate sodium  100 mg Oral BID  . feeding supplement (PRO-STAT SUGAR FREE 64)  30 mL Oral TID BM  . gabapentin  300 mg Oral QHS  . gentamicin cream  1 application Topical Daily  . insulin aspart  0-5 Units Subcutaneous QHS  . insulin aspart  0-6 Units Subcutaneous TID WC  . insulin aspart  5 Units Subcutaneous TID WC  . insulin glargine  20 Units Subcutaneous Daily  . multivitamin  1 tablet Oral QHS  . senna  1 tablet Oral BID  . sevelamer carbonate  3,200 mg Oral TID WC    BMET    Component Value Date/Time   NA 135 04/20/2019 0537   K 4.6 04/20/2019 0537   CL 96 (L) 04/20/2019 0537   CO2 21 (L) 04/20/2019 0537   GLUCOSE 105 (H) 04/20/2019 0537   BUN 94 (H) 04/20/2019 0537   CREATININE 10.06 (H) 04/20/2019 0537   CALCIUM 8.5 (L) 04/20/2019 0537   GFRNONAA 5 (L) 04/20/2019 0537   GFRAA 6 (L) 04/20/2019 0537   CBC    Component Value Date/Time   WBC 9.4 04/20/2019 0537  RBC 2.90 (L) 04/20/2019 0537   HGB 8.4 (L) 04/20/2019 0537   HCT 27.6 (L) 04/20/2019 0537   PLT 261 04/20/2019 0537   MCV 95.2 04/20/2019 0537   MCH 29.0 04/20/2019 0537   MCHC 30.4 04/20/2019 0537   RDW 14.8 04/20/2019 0537   LYMPHSABS 1.7 04/20/2019 0537   MONOABS 1.0 04/20/2019 0537   EOSABS 0.5 04/20/2019 0537   BASOSABS 0.1 04/20/2019 0537     OPDialysis Orders: CCPD 7 Days weekly 80 kg 4 exchanges 1.6 mls Dwell time 1.5 hr, No pause or day dwells. Uses mostly 2.5% solution  BMD meds: -Renvela 800 mg 3 tabs PO TID with meals -Calcitriol 0.5 mcg PO TIW (MWF) -   Problem/Plan: 1. Gangrene/ bilateral Osteo feet with severe PVD-s/p bilateral BKA 3/17. In CIR.For  d/c to SNF. 2. ESRD - Azotemic, suspected from protein intake and marginal PD Rx.  transition from [facility] temporarily for discharge to SNF.[place] by IR4/08.Renal coordinator notified for CLIP to outpatient center. Will resume outpatient [facility]  once stable fro discharge from SNF.  Will need to flush and change dressing on PD catheter weekly. 3. Hypertension/volume - BP controlled. Amlodipine10mg startedhere on3/13 Significantly under edw.Blood pressures low so decreased to 5 mg.  Does not appear volume overloaded. Will need new edw at d/c. Appears on nightly clonidine as outpt as well.  May need to taper meds if BP continues to drop.  4. Anemia - HGB initially 7.7 fell to 5.4on3/12 and transfused two units PRBCs. Heme neg 3/11 Ferritin still increased post surgery to 1420 with drop in tsat to 15% - s/p a short course of IV Fe (250 x 2) on Aranesp 200/ week - given qThurs. Transfuse as indicated.Note FOBT was checked and positive - per primary team. Pt states has hemorrhoids and no overt blood or dark stools per rectum.Hgb improved to 8.4.  5. Metabolic bone disease -[id number] on current regimen 6. DM - per PMD 7. Nutrition - Albumin low prostat, renal vits.-Renal/carb modified diet. Lactose intolerance noted.  Donetta Potts, MD [employer] [phone]

## 2019-04-20 NOTE — Progress Notes (Signed)
[place] PHYSICAL MEDICINE & REHABILITATION PROGRESS NOTE   Subjective/Complaints: Pt thinks he's going to SNF today or tomorrow0 very excited- phantom pain much better controlled- sleeping OK- wondering why hemoccult sometimes negative and sometimes positive.  Bowels working well.   ROS:   Pt denies SOB, abd pain, CP, N/V/C/D, and vision changes   Objective:   No results found. Recent Labs    04/20/19 0537  WBC 9.4  HGB 8.4*  HCT 27.6*  PLT 261   Recent Labs    04/20/19 0537  NA 135  K 4.6  CL 96*  CO2 21*  GLUCOSE 105*  BUN 94*  CREATININE 10.06*  CALCIUM 8.5*    Intake/Output Summary (Last 24 hours) at 04/20/2019 1915 Last data filed at 04/20/2019 1845 Gross per 24 hour  Intake 960 ml  Output 2128 ml  Net -1168 ml     Physical Exam: Vital Signs Blood pressure 132/78, pulse 78, temperature 99.1 F (37.3 C), temperature source Oral, resp. rate 18, height 4\' 8"  (1.422 m), weight 68.4 kg, SpO2 100 %.  Constitutional:pt sitting up in bed; nurse at bedside, appropriate, NAD HEENT:  conjugate gaze Neck: supple Cardiovascular: RRR_ R IJ dressing/catheter Respiratory/Chest: CTA B/L GI/Abdomen: soft, NT, ND, (+)BS Ext: no clubbing, cyanosis, or edema Psych: appropriate Musc: B/L BKAs- assessed R BKA  looks great- some dry flaky skin, but no erythema or drainage seen; (+) staples in tact- not assessed today Neuro: Ox3 Motor:  Bilateral lower extremities: Hip flexion, knee extension 5/5, unchanged  Assessment/Plan: 1. Functional deficits secondary to B/L BKAs due to gangrene/osteomyelitis which require 3+ hours per day of interdisciplinary therapy in a comprehensive inpatient rehab setting.  Physiatrist is providing close team supervision and 24 hour management of active medical problems listed below.  Physiatrist and rehab team continue to assess barriers to discharge/monitor patient progress toward functional and medical goals  Care Tool:  Bathing     Body parts bathed by patient: Right arm, Left arm, Chest, Abdomen, Face, Front perineal area, Right upper leg, Left upper leg, Buttocks   Body parts bathed by helper: Buttocks Body parts n/a: Right lower leg, Left lower leg   Bathing assist Assist Level: Set up assist     Upper Body Dressing/Undressing Upper body dressing   What is the patient wearing?: Pull over shirt    Upper body assist Assist Level: Independent    Lower Body Dressing/Undressing Lower body dressing      What is the patient wearing?: Underwear/pull up, Pants     Lower body assist Assist for lower body dressing: Set up assist     Toileting Toileting    Toileting assist Assist for toileting: Set up assist     Transfers Chair/bed transfer  Transfers assist     Chair/bed transfer assist level: Supervision/Verbal cueing     Locomotion Ambulation   Ambulation assist   Ambulation activity did not occur: Safety/medical concerns          Walk 10 feet activity   Assist  Walk 10 feet activity did not occur: Safety/medical concerns        Walk 50 feet activity   Assist Walk 50 feet with 2 turns activity did not occur: Safety/medical concerns         Walk 150 feet activity   Assist Walk 150 feet activity did not occur: Safety/medical concerns         Walk 10 feet on uneven surface  activity   Assist Walk 10 feet  on uneven surfaces activity did not occur: Safety/medical concerns         Wheelchair     Assist Will patient use wheelchair at discharge?: Yes Type of Wheelchair: Manual    Wheelchair assist level: Independent Max wheelchair distance: 150    Wheelchair 50 feet with 2 turns activity    Assist        Assist Level: Independent   Wheelchair 150 feet activity     Assist      Assist Level: Independent   Blood pressure 132/78, pulse 78, temperature 99.1 F (37.3 C), temperature source Oral, resp. rate 18, height 4\' 8"  (1.422 m),  weight 68.4 kg, SpO2 100 %.  Medical Problem List and Plan: 1.Impaired function/ADLs/mobilitysecondary to B/L BKAs due to osteomyelitis and gangrene- s/p IV ABX- completed 3/18  Continue CIR  2. Antithrombotics: -DVT/anticoagulation:Pharmaceutical:Heparindue to PD/ESRD -antiplatelet therapy:  3. Pain Management:Hydrocodone as needed- per pt, pain meds are suitable/working  4/4 increase gabapentin to 200mg  qhs for phantom limb pain  4/5- pt reports pain adequately managed  4/9- will increase gabapentin to 300 mg QHS. Max dose can do - if doesn't work, can switch to [employer].   4/10: Possibly more sleepy due to increased Gabapentin. Will continue to monitor.  4/12- pt very happy with increase in gabapentin- wants to continue dose  4. Mood: LCSW to follow for evaluation and support -antipsychotic agents:  5. Neuropsych: This patientiscapable of making decisions onhis own behalf. 6. Skin/Wound Care:   Will need to monitor incisions closely given recent fall  4/5- BKAs look OK considering 2 falls  4/6- skin looking good- plans on wearing shrinkers today (didn't yesterday)  4/7- can take off shrinkers for a few hours max/day.  7. Fluids/Electrolytes/Nutrition:Strict I's and O's. Monitor weights daily after PD. 8.  ESRD: Continue CCPD daily.Is oliguric, not anuric- voids ~2x/day  4/7- need to go to Hemodialysis since going to SNF  4/8- going today for HD catheter- is NPO 9.Acute on chronic anemia: On ESA weekly.   Hemoglobin 7.4 on 3/26, continue to monitor  3/31- Last Hb 7/8- will recheck tomorrow  4/1- Hb 7.5- stable  4/5- Hb 8.0- better but variable  4/7- Hb 7.9- con't meds  4/9- labs Monday  4/12- Hb up to 8.4! 10.Nutrition: Severe protein malnutrition-Renal/carb modified diet to continue with prostat due to low calorie/proteinmalnutrition. 11. XBL:TJQZESP blood pressures twice daily. Off hydralazine and furosemide at this time.   Con't  clonidine and Norvasc   4/8- BP a little soft 105/60s- con't meds  4/10: BP improved, 4/11: still a little soft.   4/12- BP 130s/70s- doing better 12.T2DM with hyperglycemia: A1c 7.8-Continue Lantus daily--d/c meal coverage due to hypoglycemic episodes.Monitor blood sugars AC at bedtime for now due to hypoglycemia.   CBG (last 3)  Recent Labs    04/20/19 1524 04/20/19 1616 04/20/19 1707  GLUCAP 69* 88 81   Lantus decreased to 15 units on 3/27 (already received a.m. dose)  Labile with hypoglycemia on 3/28.  Will need to monitor closely overnight.  Bedtime snack ordered for tonight.   3/30- DM coord asked me to increase Lantus to 18 units-   4/3: cbgs remain elevated, labile. Treating and highs and lows which causes rebound effect  4/4 Lantus 20u daily started today, increase mealtime covg to 5u  4/8- BG a little low- is NPO this AM- if drops, will need IVFs D5NS  4/9- BGs labile due to being NPO most of yesterday- con't regimen  4/10: continue  to be labile  4/11: well controlled  4/12- had low BG this AM of 69- otherwise well controlled 14. Leukocytosis: Resolved  3/30- WBC down to 8.9 from 13 yesterday 15. Hyperkalemia  3/30- up to 5.3- will see if renal will address since renal pt- if not, will try Lokelma.  3/31- Renal ordered Lokelma x1- wil recheck labs in AM  4/1- K+ down to 4.4- con't regimen 16. Constipation  3/31- will order Sorbitol 60 ml then follow in 3-4 hours with enema.   4/1- cleaned out- reminded pt to NOT refuse bowel meds  4/4 reiterating use/need of preventative bowel meds 17. LLQ Hematoma  4/2- will monitor hematoma- explained to try and avoid injecting into it with SQ Heparin- if not improving, will order imaging next week.  4/5- is getting smaller- was small plum, now large grape size.  18. [name]  4/5- pt seems to think can go home, but is min assist for transfers, not Mod I or even supervision- team is trying to address.  4/6- pt emphatic that will  have family at home- - won't entertain any other ideas.   4/8- going to SNF once gets a bed- and changing to HD  4/9- signed FL2- HD catheter placed- will see if needs to do here, or staying with PD while here. Will check with renal.   4/11: appreciate renal note: transitioned to HD temporarily in preparation for DC to SNF  4/12- d/c tomorrow to SNF?  LOS: 20 days A FACE TO FACE EVALUATION WAS PERFORMED  Neha Waight 04/20/2019, 7:15 PM

## 2019-04-21 ENCOUNTER — Inpatient Hospital Stay (HOSPITAL_COMMUNITY): Payer: Medicare Other | Admitting: Occupational Therapy

## 2019-04-21 ENCOUNTER — Inpatient Hospital Stay (HOSPITAL_COMMUNITY): Payer: Medicare Other

## 2019-04-21 LAB — GLUCOSE, CAPILLARY
Glucose-Capillary: 123 mg/dL — ABNORMAL HIGH (ref 70–99)
Glucose-Capillary: 167 mg/dL — ABNORMAL HIGH (ref 70–99)
Glucose-Capillary: 126 mg/dL — ABNORMAL HIGH (ref 70–99)
Glucose-Capillary: 122 mg/dL — ABNORMAL HIGH (ref 70–99)

## 2019-04-21 LAB — SARS CORONAVIRUS 2 (TAT 6-24 HRS): SARS Coronavirus 2: NEGATIVE

## 2019-04-21 MED ORDER — INSULIN ASPART 100 UNIT/ML ~~LOC~~ SOLN: 5.0000 [IU] | Freq: Three times a day (TID) | SUBCUTANEOUS | 11 refills | Status: DC

## 2019-04-21 MED ORDER — GABAPENTIN 300 MG PO CAPS: 300.0000 mg | ORAL_CAPSULE | Freq: Every day | ORAL | Status: DC

## 2019-04-21 MED ORDER — HYDROCODONE-ACETAMINOPHEN 5-325 MG PO TABS: 1.0000 | ORAL_TABLET | Freq: Two times a day (BID) | ORAL | 0 refills | Status: DC | PRN

## 2019-04-21 MED ORDER — SEVELAMER CARBONATE 800 MG PO TABS: 3200.0000 mg | ORAL_TABLET | Freq: Three times a day (TID) | ORAL | Status: DC

## 2019-04-21 MED ORDER — ACETAMINOPHEN 325 MG PO TABS: 325.0000 mg | ORAL_TABLET | ORAL | Status: DC | PRN

## 2019-04-21 MED ORDER — DARBEPOETIN ALFA 200 MCG/0.4ML IJ SOSY: 200.0000 ug | PREFILLED_SYRINGE | INTRAMUSCULAR | Status: DC

## 2019-04-21 MED ORDER — SENNA 8.6 MG PO TABS: 1.0000 | ORAL_TABLET | Freq: Two times a day (BID) | ORAL | 0 refills | Status: DC

## 2019-04-21 MED ORDER — DIPHENHYDRAMINE HCL 12.5 MG/5ML PO ELIX: 12.5000 mg | ORAL_SOLUTION | Freq: Four times a day (QID) | ORAL | 0 refills | Status: DC | PRN

## 2019-04-21 MED ORDER — TRAZODONE HCL 50 MG PO TABS: 25.0000 mg | ORAL_TABLET | Freq: Every evening | ORAL | Status: DC | PRN

## 2019-04-21 MED ORDER — MILK AND MOLASSES ENEMA: 1.0000 | Freq: Every day | RECTAL | Status: DC | PRN

## 2019-04-21 MED ORDER — SEVELAMER CARBONATE 800 MG PO TABS: 800.0000 mg | ORAL_TABLET | Freq: Two times a day (BID) | ORAL | Status: DC | PRN

## 2019-04-21 NOTE — Progress Notes (Addendum)
Social Work Patient ID: Jorge [name], male   DOB: 1970-06-01, 49 y.o.   MRN: [id number]   SW received follow-up from [facility] Dialysis Coordinator [phone]) stating pt will have dialysis with [facility] on TTS with seat time at 12:30pm with arrival time by 12:10pm. SW to follow-up with SNF[facility] to inform. SW left message for Tanya/Admissions with Helene Kelp [phone]) to inform on above. SW waiting on follow-up.   SW received return phone call indicating pt accepted by SNF and dialysis days were not an issue. SW informed medical team. SW requested COVID test for pt. SW updated pt on above.   In the event pt is unable to get transportation via SNF or family, SW arranged [facility] ambulance pick up at 11am. SW to follow-up with pt in the morning on above. SW waiting on follow-up from SNF on if able to pickup pt tomorrow.  *SW received updated message for Colleen Shaw/dialysis coord indicating pt dialysis this week will be M/TR/Sa. Additional labs will be ordered for him tomorrow. Pt will not have dialysis tomorrow.   Loralee Pacas, MSW, [place] Office: [phone] Cell: [phone] Fax: [phone]

## 2019-04-21 NOTE — Progress Notes (Signed)
[place] PHYSICAL MEDICINE & REHABILITATION PROGRESS NOTE   Subjective/Complaints:  Feeling nauseated this AM- thinks might have taken pain meds last night without food.   Still getting therapy- kept busy,  HD went well yesterday but wish food was better.  Ready for SNF  ROS:   Pt denies SOB, abd pain, CP, N/V/C/D, and vision changes  Objective:   No results found. Recent Labs    04/20/19 0537  WBC 9.4  HGB 8.4*  HCT 27.6*  PLT 261   Recent Labs    04/20/19 0537  NA 135  K 4.6  CL 96*  CO2 21*  GLUCOSE 105*  BUN 94*  CREATININE 10.06*  CALCIUM 8.5*    Intake/Output Summary (Last 24 hours) at 04/21/2019 0948 Last data filed at 04/21/2019 0732 Gross per 24 hour  Intake 840 ml  Output 2003 ml  Net -1163 ml     Physical Exam: Vital Signs Blood pressure 99/60, pulse 65, temperature 97.9 F (36.6 C), resp. rate 16, height 4\' 8"  (1.422 m), weight 68.4 kg, SpO2 100 %.  Constitutional:pt sitting up in bed, appropriate, NAD HEENT:  conjugate gaze Neck: supple Cardiovascular: RRR- R chest catheter in place Respiratory/Chest: CTA b/L GI/Abdomen: soft, NT, ND, (+)BS Ext: no clubbing, cyanosis, or edema Psych: appropriate Musc: B/L BKAs- assessed B/L- skin very dry flaking- otherwise staples intact- look great- no drainage- improved edema Neuro: Ox3 Motor:  Bilateral lower extremities: Hip flexion, knee extension 5/5, unchanged  Assessment/Plan: 1. Functional deficits secondary to B/L BKAs due to gangrene/osteomyelitis which require 3+ hours per day of interdisciplinary therapy in a comprehensive inpatient rehab setting.  Physiatrist is providing close team supervision and 24 hour management of active medical problems listed below.  Physiatrist and rehab team continue to assess barriers to discharge/monitor patient progress toward functional and medical goals  Care Tool:  Bathing    Body parts bathed by patient: Right arm, Left arm, Chest, Abdomen, Face,  Front perineal area, Right upper leg, Left upper leg, Buttocks   Body parts bathed by helper: Buttocks Body parts n/a: Right lower leg, Left lower leg   Bathing assist Assist Level: Set up assist     Upper Body Dressing/Undressing Upper body dressing   What is the patient wearing?: Pull over shirt    Upper body assist Assist Level: Independent    Lower Body Dressing/Undressing Lower body dressing      What is the patient wearing?: Underwear/pull up, Pants     Lower body assist Assist for lower body dressing: Set up assist     Toileting Toileting    Toileting assist Assist for toileting: Set up assist     Transfers Chair/bed transfer  Transfers assist     Chair/bed transfer assist level: Supervision/Verbal cueing     Locomotion Ambulation   Ambulation assist   Ambulation activity did not occur: Safety/medical concerns          Walk 10 feet activity   Assist  Walk 10 feet activity did not occur: Safety/medical concerns        Walk 50 feet activity   Assist Walk 50 feet with 2 turns activity did not occur: Safety/medical concerns         Walk 150 feet activity   Assist Walk 150 feet activity did not occur: Safety/medical concerns         Walk 10 feet on uneven surface  activity   Assist Walk 10 feet on uneven surfaces activity did not occur: Safety/medical  concerns         Wheelchair     Assist Will patient use wheelchair at discharge?: Yes Type of Wheelchair: Manual    Wheelchair assist level: Independent Max wheelchair distance: 150    Wheelchair 50 feet with 2 turns activity    Assist        Assist Level: Independent   Wheelchair 150 feet activity     Assist      Assist Level: Independent   Blood pressure 99/60, pulse 65, temperature 97.9 F (36.6 C), resp. rate 16, height 4\' 8"  (1.422 m), weight 68.4 kg, SpO2 100 %.  Medical Problem List and Plan: 1.Impaired  function/ADLs/mobilitysecondary to B/L BKAs due to osteomyelitis and gangrene- s/p IV ABX- completed 3/18  Continue CIR  2. Antithrombotics: -DVT/anticoagulation:Pharmaceutical:Heparindue to PD/ESRD -antiplatelet therapy:  3. Pain Management:Hydrocodone as needed- per pt, pain meds are suitable/working  4/4 increase gabapentin to 200mg  qhs for phantom limb pain  4/5- pt reports pain adequately managed  4/9- will increase gabapentin to 300 mg QHS. Max dose can do - if doesn't work, can switch to [employer].   4/12- pt very happy with increase in gabapentin- wants to continue dose  4. Mood: LCSW to follow for evaluation and support -antipsychotic agents:  5. Neuropsych: This patientiscapable of making decisions onhis own behalf. 6. Skin/Wound Care:   Will need to monitor incisions closely given recent fall  4/5- BKAs look OK considering 2 falls  4/6- skin looking good- plans on wearing shrinkers today (didn't yesterday)  4/7- can take off shrinkers for a few hours max/day.   4/13- BKAs look great- staples out per vascular timeline 7. Fluids/Electrolytes/Nutrition:Strict I's and O's. Monitor weights daily after PD. 8.  ESRD: Continue CCPD daily.Is oliguric, not anuric- voids ~2x/day  4/7- need to go to Hemodialysis since going to SNF  4/8- going today for HD catheter- is NPO  4/13- did HD yesterday- went well- tolerating 9.Acute on chronic anemia: On ESA weekly.   Hemoglobin 7.4 on 3/26, continue to monitor  3/31- Last Hb 7/8- will recheck tomorrow  4/1- Hb 7.5- stable  4/5- Hb 8.0- better but variable  4/7- Hb 7.9- con't meds  4/9- labs Monday  4/12- Hb up to 8.4! 10.Nutrition: Severe protein malnutrition-Renal/carb modified diet to continue with prostat due to low calorie/proteinmalnutrition. 11. LTJ:QZESPQZ blood pressures twice daily. Off hydralazine and furosemide at this time.   Con't clonidine and Norvasc   4/8- BP a little soft  105/60s- con't meds  4/10: BP improved, 4/11: still a little soft.   4/12- BP 130s/70s- doing better 12.T2DM with hyperglycemia: A1c 7.8-Continue Lantus daily--d/c meal coverage due to hypoglycemic episodes.Monitor blood sugars AC at bedtime for now due to hypoglycemia.   CBG (last 3)  Recent Labs    04/20/19 1707 04/20/19 2129 04/21/19 0646  GLUCAP 81 155* 123*   Lantus decreased to 15 units on 3/27 (already received a.m. dose)  Labile with hypoglycemia on 3/28.  Will need to monitor closely overnight.  Bedtime snack ordered for tonight.   3/30- DM coord asked me to increase Lantus to 18 units-   4/3: cbgs remain elevated, labile. Treating and highs and lows which causes rebound effect  4/4 Lantus 20u daily started today, increase mealtime covg to 5u  4/8- BG a little low- is NPO this AM- if drops, will need IVFs D5NS  4/9- BGs labile due to being NPO most of yesterday- con't regimen  4/10: continue to be labile  4/11: well  controlled  4/12- had low BG this AM of 69- otherwise well controlled  4/13- BGs well controlled- con't regimen 14. Leukocytosis: Resolved  3/30- WBC down to 8.9 from 13 yesterday 15. Hyperkalemia  3/30- up to 5.3- will see if renal will address since renal pt- if not, will try Lokelma.  3/31- Renal ordered Lokelma x1- wil recheck labs in AM  4/1- K+ down to 4.4- con't regimen 16. Constipation  3/31- will order Sorbitol 60 ml then follow in 3-4 hours with enema.   4/1- cleaned out- reminded pt to NOT refuse bowel meds  4/4 reiterating use/need of preventative bowel meds 17. LLQ Hematoma  4/2- will monitor hematoma- explained to try and avoid injecting into it with SQ Heparin- if not improving, will order imaging next week.  4/5- is getting smaller- was small plum, now large grape size.  18. [name]  4/5- pt seems to think can go home, but is min assist for transfers, not Mod I or even supervision- team is trying to address.  4/6- pt emphatic that will have  family at home- - won't entertain any other ideas.   4/8- going to SNF once gets a bed- and changing to HD  4/9- signed FL2- HD catheter placed- will see if needs to do here, or staying with PD while here. Will check with renal.   4/11: appreciate renal note: transitioned to HD temporarily in preparation for DC to SNF  4/12- d/c tomorrow to SNF?  4/13- d/c to SNF tomorrow? Nausea today- rec nausea meds that are ordered.   LOS: 21 days A FACE TO FACE EVALUATION WAS PERFORMED  Jorge [name] 04/21/2019, 9:48 AM

## 2019-04-21 NOTE — Discharge Instructions (Signed)
Inpatient Rehab Discharge Instructions  Jorge [name] Discharge date and time: 04/21/19    Activities/Precautions/ Functional Status: Activity: activity as tolerated Diet: renal diet 1200  Cc/FR Wound Care: keep wound clean and dry   Functional status:  ___ No restrictions     ___ Walk up steps independently _X__ 24/7 supervision/assistance   ___ Walk up steps with assistance ___ Intermittent supervision/assistance  ___ Bathe/dress independently ___ Walk with walker     ___ Bathe/dress with assistance ___ Walk Independently    ___ Shower independently ___ Walk with assistance    ___ Shower with assistance ___ No alcohol     ___ Return to work/school ________  Special Instructions:    My questions have been answered and I understand these instructions. I will adhere to these goals and the provided educational materials after my discharge from the hospital.  Patient/Caregiver Signature _______________________________ Date __________  Clinician Signature _______________________________________ Date __________  Please bring this form and your medication list with you to all your follow-up doctor's appointments.

## 2019-04-21 NOTE — Progress Notes (Signed)
Occupational Therapy Session Note  Patient Details  Name: Jorge [name] MRN: [id number] Date of Birth: May 01, 1970  Today's Date: 04/21/2019 OT Individual Time: 1330-1430 OT Individual Time Calculation (min): 60 min    Short Term Goals: Week 3:  OT Short Term Goal 1 (Week 3): LTG=STG  Skilled Therapeutic Interventions/Progress Updates:    Patient in bed, alert, reports nausea earlier today.  Currently on bed pan but unable to have a BM - he removes bed pan and completes clothing management independently.  Supine to SSP from flat bed surface with CS.  He is able to place SB and complete transfer bed to w.c with CS.  Vitals taken by tech and recorded as patient c/o feeling light headed and feeling of nausea.  After sitting for a few moments he reports feeling better and wants to go to the therapy gym.  He is able to set up w/c independently, donn limb guards and propel w/c to gym.  SB transfer w/c to mat table with CS.  He completed prone, side lying and supine LB/core exercises - declines shoulder/UB exercises this session due to sore shoulders.  Returned to w/c via SB CS.  And back to bed at close of session with CS.  Bed alarm set, call bell and tray table in reach.  He denies pain or discomfort at this time.    Therapy Documentation Precautions:  Precautions Precautions: Fall Precaution Comments: bliateral limb guards Restrictions Weight Bearing Restrictions: Yes RLE Weight Bearing: Non weight bearing LLE Weight Bearing: Non weight bearing General:   Vital Signs: Therapy Vitals Temp: 97.9 F (36.6 C) Pulse Rate: 65 Resp: 16 BP: 99/60 Patient Position (if appropriate): Lying Oxygen Therapy SpO2: 100 % O2 Device: Room Air  Therapy/Group: Individual Therapy  Carlos Levering 04/21/2019, 7:36 AM

## 2019-04-21 NOTE — Progress Notes (Signed)
Physical Therapy Session Note  Patient Details  Name: Jorge [name] MRN: [id number] Date of Birth: Aug 30, 1970  Today's Date: 04/21/2019 PT Individual Time: 9892-1194 and 1740-8144 PT Individual Time Calculation (min): 58 min and 69 min    Short Term Goals: Week 1:  PT Short Term Goal 1 (Week 1): STG = LTG due to short ELOS.  Skilled Therapeutic Interventions/Progress Updates:    Session 1: Pt supine in bed upon PT arrival, agreeable to therapy tx and reports shoulder soreness 5/10, limited activity for pain relief. Pt supine donned shrinkers bilaterally with supervision. Pt reports he is feeling nauseated this morning, reports that RN just provided medication. Pt declining OOB activity this morning secondary to nausea, agreeable to bed level therex. Pt performed 2 x 10 of the following LE exercises for strength/ROM - straight leg raises, SAQ, sidelying hip abduction, sidelying hip extension- cues throughout for appropriate form/technique. Performed rolling during session Mod I. Prone position on elbows for stretching. Therapist performed B hamstring stretching this session 2 x 1 min for ROM. Provided education on importance of performing HEP exercises for strength/ROM. Pt agreeable to get OOB and wash up at sink. Transferred to sitting mod I and slideboard transfer to w/c with supervision. Pt propelled w/c throughout the room Mod I. Pt sitting at the sink in w/c to brush teeth, perform upper body washing/dressing with set up assist. Pt transferred back to bed with supervision and slideboard. While in bed pt performed lower body bathing/dressing with set up assist. Long sitting to don shirt. Pt left supine in bed at end of session with needs in reach and bed alarm set.   Session 2: Pt supine in bed upon PT arrival, agreeable to therapy tx and reports pain in shoulders 4/10 - limited UE activities for pain relief. SW in/out this session, discussed transfer to SNF tomorrow. Recommending that the  patient has family come to take pt to SNF via car. Pt performed bed mobility independently this session and slideboard transfer to w/c with supervision. Pt performed UE w/c propulsion throughout the session >200 ft using B arms, pt performes all w/c parts management inlcuding donning/doffing leg rests. Pt dons/doff limb guards as well independently as needed. Pt performed car transfer this session with supervision and cues for safety. Therapist provided d/c education this session. Pt agreeable to go outside this session to worked on w/c propulsion on unlevel surfaces. Pt propelled w/c on unlevel surfaces independently this session x100 ft using arms. Pt seated in w/c performed strengthening exercises 2 x 10 of each - LAQ, adductor squeezes, UE w/c push ups, glute sets and hip abduction. Pt performed another bout of w/c propulsion outside this session on incline/decline, cues for safety. Pt propelled back to unit and left in care of daughter.     Therapy Documentation Precautions:  Precautions Precautions: Fall Precaution Comments: bliateral limb guards Restrictions Weight Bearing Restrictions: Yes RLE Weight Bearing: Non weight bearing LLE Weight Bearing: Non weight bearing     Therapy/Group: Individual Therapy  Netta Corrigan, PT, DPT, CSRS 04/21/2019, 7:48 AM

## 2019-04-21 NOTE — Progress Notes (Signed)
Patient has been accepted at [facility] for OP HD treatment (modality change from PD) on a TTS schedule with a seat time of 12:30pm. He needs to arrive to his appointments at 12:10am. On his first day at the clinic, he needs to arrive at 11:30am to complete intake paperwork prior to his first treatment. Navigator informed CIR CSW/A. [name], Nephrologist and Renal PA. Navigator will meet with patient to inform also.  Alphonzo Cruise, [place] Renal Navigator [phone]

## 2019-04-21 NOTE — Patient Care Conference (Signed)
Inpatient RehabilitationTeam Conference and Plan of Care Update Date: 04/21/2019   Time: 11:35 AM    Patient Name: Jorge [name]      Medical Record Number: [id number]  Date of Birth: 10/30/1970 Sex: Male         Room/Bed: 4W08C/4W08C-01 Payor Info: Payor: GENERIC MEDICARE ADVANTAGE / Plan: GENERIC MEDICARE ADVANTAGE / Product Type: *No Product type* /    Admit Date/Time:  03/31/2019  4:18 PM  Primary Diagnosis:  S/P BKA (below knee amputation) bilateral [facility])  Patient Active Problem List   Diagnosis Date Noted  . Fall   . Hypoglycemia   . Labile blood glucose   . Anemia of chronic disease   . Acute blood loss anemia   . S/P BKA (below knee amputation) bilateral ([place]) 03/31/2019  . Below knee amputation ([place]) 03/31/2019  . Severe protein-calorie malnutrition ([place]) 03/20/2019  . ESRD on peritoneal dialysis ([place])   . ESRD (end stage renal disease) ([place]) 03/19/2019  . Hypertension   . Insulin-requiring or dependent type II diabetes mellitus ([place])   . Normocytic anemia     Expected Discharge Date: Expected Discharge Date: 04/22/19  Team Members Present: Physician leading conference: Dr. Courtney Heys Care Coodinator Present: Karene Fry, RN, MSN;Christina Sampson Goon, Janine Limbo, [place] Nurse Present: Rayne Du, LPN PT Present: Michaelene Song, PT OT Present: Elisabeth Most, OT SLP Present: Weston Anna, SLP PPS Coordinator present : Gunnar Fusi, SLP     Current Status/Progress Goal Weekly Team Focus  Bowel/Bladder   continent of b/b; LBM: 04/12  remain continent  assess QS and prn   Swallow/Nutrition/ Hydration             ADL's   SB transfer with set up/CS, bed mobility CS, bathing and dressing (LB bed level, UB w/c level) with set up.  set up/mod I  adl/transfer training, core strength and balance   Mobility   mod I bed mobility and w/c propulsion, CGA/set up for slideboard transfers  Independent bed mobility, supervision transfers, mod I household  w/c mobility, supervision in community w/c mobility  w/c mobility, transfers, LE strength/ROM, d/c planning, bed mobility   Communication             Safety/Cognition/ Behavioral Observations            Pain   c/o phantom pain to BLE; prn NORCO q4hr  pain <4  assess pain QS and prn   Skin   bil BKA with staples and stump shrinkers; HD cath RU chest  remain free of new skin breakdown/infection  assess skin QS and prn    Rehab Goals Patient on target to meet rehab goals: Yes *See Care Plan and progress notes for long and short-term goals.     Barriers to Discharge  Current Status/Progress Possible Resolutions Date Resolved   Nursing                  PT                    OT                  SLP                SW Hemodialysis              Discharge Planning/Teaching Needs:  D/c to SNF. SNF pending dialysis location/days  N/A   Team Discussion: MD on HD, nausea this am.  RN stumps look good,  cont B/B.  OT UB D set up, LB D set up, bathing set up, transfers close S/CGA.  PT S/CGA all transfers, mod I bed.  SNF placement is pending.   Revisions to Treatment Plan: N/A     Medical Summary Current Status: Nasueated this AM; continent- pain well controlled- Weekly Focus/Goal: OT- UB s/u; LB s/u at bed level; sponge level s/u; CGA for transfers  Barriers to Discharge: Decreased family/caregiver support;Weight;Weight bearing restrictions;Wound care  Barriers to Discharge Comments: n/a Possible Resolutions to Barriers: PT- SBA to CGA to mobility   Continued Need for Acute Rehabilitation Level of Care: The patient requires daily medical management by a physician with specialized training in physical medicine and rehabilitation for the following reasons: Direction of a multidisciplinary physical rehabilitation program to maximize functional independence : Yes Medical management of patient stability for increased activity during participation in an intensive rehabilitation regime.:  Yes Analysis of laboratory values and/or radiology reports with any subsequent need for medication adjustment and/or medical intervention. : Yes   I attest that I was present, lead the team conference, and concur with the assessment and plan of the team.   Retta Diones 04/21/2019, 6:14 PM   Team conference was held via web/ teleconference due to [place] - 19

## 2019-04-21 NOTE — Discharge Summary (Addendum)
Physician Discharge Summary  Patient ID: Jorge [name] MRN: [id number] DOB/AGE: [birth year] 51 y.o.  Admit date: 03/31/2019 Discharge date: 04/24/2019  Discharge Diagnoses:  Principal Problem:   S/P BKA (below knee amputation) bilateral ([place]) Active Problems:   Insulin-requiring or dependent type II diabetes mellitus ([place])   ESRD on peritoneal dialysis ([place])   Severe protein-calorie malnutrition ([place])   Acute on chronic anemia   Discharged Condition: stable    Significant Diagnostic Studies: IR Fluoro Guide CV Line Right  Result Date: 04/16/2019 INDICATION: 49 year old male with a history of end-stage renal disease referred for tunneled HD placement EXAM: TUNNELED CENTRAL VENOUS HEMODIALYSIS CATHETER PLACEMENT WITH ULTRASOUND AND FLUOROSCOPIC GUIDANCE MEDICATIONS: 2 g Ancef. The antibiotic was given in an appropriate time interval prior to skin puncture. ANESTHESIA/SEDATION: Moderate (conscious) sedation was employed during this procedure. A total of Versed 1.0 mg and Fentanyl 50 mcg was administered intravenously. Moderate Sedation Time: 19 minutes. The patient's level of consciousness and vital signs were monitored continuously by radiology nursing throughout the procedure under my direct supervision. FLUOROSCOPY TIME:  Fluoroscopy Time: 0 minutes 54 seconds (4 mGy). COMPLICATIONS: None PROCEDURE: Informed written consent was obtained from the patient after a discussion of the risks, benefits, and alternatives to treatment. Questions regarding the procedure were encouraged and answered. The right neck and chest were prepped with chlorhexidine in a sterile fashion, and a sterile drape was applied covering the operative field. Maximum barrier sterile technique with sterile gowns and gloves were used for the procedure. A timeout was performed prior to the initiation of the procedure. After creating a small venotomy incision, a micropuncture kit was utilized to access the right internal jugular  vein under direct, real-time ultrasound guidance after the overlying soft tissues were anesthetized with 1% lidocaine with epinephrine. Ultrasound image documentation was performed. The microwire was marked to measure appropriate internal catheter length. External tunneled length was estimated. A total tip to cuff length of 19 cm was selected. Skin and subcutaneous tissues of chest wall below the clavicle were generously infiltrated with 1% lidocaine for local anesthesia. A small stab incision was made with 11 blade scalpel. The selected hemodialysis catheter was tunneled in a retrograde fashion from the anterior chest wall to the venotomy incision. A guidewire was advanced to the level of the IVC and the micropuncture sheath was exchanged for a peel-away sheath. The catheter was then placed through the peel-away sheath with tips ultimately positioned within the superior aspect of the right atrium. Final catheter positioning was confirmed and documented with a spot radiographic image. The catheter aspirates and flushes normally. The catheter was flushed with appropriate volume heparin dwells. The catheter exit site was secured with a 0-Prolene retention suture. The venotomy incision was closed Derma bond and sterile dressing. Dressings were applied at the chest wall. Patient tolerated the procedure well and remained hemodynamically stable throughout. No complications were encountered and no significant blood loss encountered. IMPRESSION: Status post right IJ tunneled hemodialysis catheter placement. Catheter ready for use. Signed, Dulcy Fanny. Dellia Nims, RPVI [facility] Radiology Electronically Signed   By: Corrie Mckusick D.O.   On: 04/16/2019 16:28   IR US Guide Vasc Access Right  Result Date: 04/16/2019 INDICATION: 49 year old male with a history of end-stage renal disease referred for tunneled HD placement EXAM: TUNNELED CENTRAL VENOUS HEMODIALYSIS CATHETER  PLACEMENT WITH ULTRASOUND AND FLUOROSCOPIC GUIDANCE MEDICATIONS: 2 g Ancef. The antibiotic was given in an appropriate time interval prior to skin puncture. ANESTHESIA/SEDATION: Moderate (  conscious) sedation was employed during this procedure. A total of Versed 1.0 mg and Fentanyl 50 mcg was administered intravenously. Moderate Sedation Time: 19 minutes. The patient's level of consciousness and vital signs were monitored continuously by radiology nursing throughout the procedure under my direct supervision. FLUOROSCOPY TIME:  Fluoroscopy Time: 0 minutes 54 seconds (4 mGy). COMPLICATIONS: None PROCEDURE: Informed written consent was obtained from the patient after a discussion of the risks, benefits, and alternatives to treatment. Questions regarding the procedure were encouraged and answered. The right neck and chest were prepped with chlorhexidine in a sterile fashion, and a sterile drape was applied covering the operative field. Maximum barrier sterile technique with sterile gowns and gloves were used for the procedure. A timeout was performed prior to the initiation of the procedure. After creating a small venotomy incision, a micropuncture kit was utilized to access the right internal jugular vein under direct, real-time ultrasound guidance after the overlying soft tissues were anesthetized with 1% lidocaine with epinephrine. Ultrasound image documentation was performed. The microwire was marked to measure appropriate internal catheter length. External tunneled length was estimated. A total tip to cuff length of 19 cm was selected. Skin and subcutaneous tissues of chest wall below the clavicle were generously infiltrated with 1% lidocaine for local anesthesia. A small stab incision was made with 11 blade scalpel. The selected hemodialysis catheter was tunneled in a retrograde fashion from the anterior chest wall to the venotomy incision. A guidewire was advanced to the level of the IVC and the micropuncture  sheath was exchanged for a peel-away sheath. The catheter was then placed through the peel-away sheath with tips ultimately positioned within the superior aspect of the right atrium. Final catheter positioning was confirmed and documented with a spot radiographic image. The catheter aspirates and flushes normally. The catheter was flushed with appropriate volume heparin dwells. The catheter exit site was secured with a 0-Prolene retention suture. The venotomy incision was closed Derma bond and sterile dressing. Dressings were applied at the chest wall. Patient tolerated the procedure well and remained hemodynamically stable throughout. No complications were encountered and no significant blood loss encountered. IMPRESSION: Status post right IJ tunneled hemodialysis catheter placement. Catheter ready for use. Signed, Dulcy Fanny. Dellia Nims, RPVI [facility]iology [facility] Radiology Electronically Signed   By: Corrie Mckusick D.O.   On: 04/16/2019 16:28    Labs:  Basic Metabolic Panel: BMP Latest Ref Rng & Units 04/24/2019 04/20/2019 04/15/2019  Glucose 70 - 99 mg/dL 148(H) 105(H) 118(H)  BUN 6 - 20 mg/dL 46(H) 94(H) 108(H)  Creatinine 0.61 - 1.24 mg/dL 5.64(H) 10.06(H) 11.99(H)  Sodium 135 - 145 mmol/L 135 135 131(L)  Potassium 3.5 - 5.1 mmol/L 4.6 4.6 4.4  Chloride 98 - 111 mmol/L 97(L) 96(L) 92(L)  CO2 22 - 32 mmol/L 27 21(L) 21(L)  Calcium 8.9 - 10.3 mg/dL 7.7(L) 8.5(L) 8.3(L)    CBC: CBC Latest Ref Rng & Units 04/24/2019 04/20/2019 04/15/2019  WBC 4.0 - 10.5 K/uL 9.7 9.4 8.3  Hemoglobin 13.0 - 17.0 g/dL 8.4(L) 8.4(L) 7.9(L)  Hematocrit 39.0 - 52.0 % 27.9(L) 27.6(L) 25.6(L)  Platelets 150 - 400 K/uL 244 261 210    CBG: Recent Labs  Lab 04/20/19 1616 04/20/19 1707 04/20/19 2129 04/21/19 0646 04/21/19 1141  GLUCAP 88 81 155* 123* 167*    Brief HPI:   Jorge [name] is a 49 y.o. male with history of T2DM, HTN, ESRD-PD who was admitted on 03/19/2019 with ulcers  on bilateral  feet with gangrenous changes.  He was found to be septic, had acute on chronic anemia and x-rays of feet revealed osteomyelitis.  He was started on broad-spectrum antibiotics with attempts at limb salvage.  He did have a drop in his hemoglobin to 5.4 on 03/12 and was transfused with 2 units PRBCs.  He underwent bilateral BKA by Dr. Sharol Given on 03/17 and to be NWB-BLE.  Pain control and activity tolerance was improving.  He was noted to have deficits in mobility and ADLs.  CIR was recommended for follow-up therapy.   Hospital Course: Jorge [name] was admitted to rehab 03/31/2019 for inpatient therapies to consist of PT and OT at least three hours five days a week. Past admission physiatrist, therapy team and rehab RN have worked together to provide customized collaborative inpatient rehab.  His blood pressures were monitored on TID basis and have been controlled.  Acute on chronic anemia has been monitored with serial checks.  He continues on iron as 200 mcg/week.  Stool guaiacs were ordered and 2 out of 5 stools noted to be positive and felt to be due to hemorrhoids as H&H have been relatively stable.  No signs of overt bleeding or dark stools reported. Anusol HC suppository added to treat hemorrhoids.   Diabetes has been monitored with ac/hs CBG checks and SSI was use prn for tighter BS control.  He has had intermittent issues with hypoglycemia therefore meal coverage was discontinued.  Lantus has been titrated to 20 units daily.  Leukocytosis has resolved and no signs of infection noted. He has had issues with phantom pain gabapentin was adjusted to 300 mg p.o. nightly with improvement in symptoms.  Hydrocodone is being used once a day on average to help manage severe pain.  Bilateral BKA incisions are C's/D/I with staples in place and stump shrinker's is being used to help with edema control and for molding.    Strict I's and O's with daily weights have been ongoing. Weight is 150.79 kg at  discharge.  PD ongoing during his stay however family decided they unable to provide assistance therefore this was transitioned to hemodialysis.  Tunneled catheter placed by Dr. Earleen Newport of interventional radiology on 04/08.  Weekly flushes and dressing changes of PD catheter due to be done by dialysis center.   Blood pressures have been monitored on 3 times daily basis and amlodipine has been tapered off to help with tolerance of HD and prevent hypotension.  His blood pressures have been trending down with HD with SBP in 80-100 therefore clonidine was d/c on 4/14. Prostat was added to help with low calorie malnutrition. He is continent of bowel and bladder.  He has made gains during rehab stay and is currently at supervision to min assist level. His stay was extended by 3 days due to exposure to Covid + family member.  Repeat Covid test of 04/16 was negative. He was discharged to [facility] on 04/24/19 for progressive therapies.    Rehab course: During patient's stay in rehab weekly team conferences were held to monitor patient's progress, set goals and discuss barriers to discharge. At admission, patient required min assist with mobility and ADL tasks.  He   has had improvement in activity tolerance, balance, postural control as well as ability to compensate for deficits.  He requires set up assist with supervision for ADL tasks.  He requires supervision with verbal cues to use sliding board for lateral scoot transfers.  He is independent for wheelchair mobility and  is able to propel his wheelchair for 200 feet.  Disposition: Skilled Nursing Facility  Diet: Carb modified/Renal diet/1200 cc FR/day  Special Instructions: 1. Monitor BS ac/hs. Continue SSI per protocol. Offer snack at bedtime.  2. HD on TTS at [facility] --12:20pm seat time/be there at 12:10pm. 3. Recommend GI work up if hematochezia or tarry stools noted.     Discharge Instructions    Ambulatory referral to Physical Medicine Rehab    Complete by: As directed    3-4 weeks follow up appointment     Allergies as of 04/22/2019      Reactions   Lactose Intolerance (gi) Diarrhea, Nausea Only      Medication List    STOP taking these medications   amLODipine 10 MG tablet Commonly known as: NORVASC   bisacodyl 10 MG suppository Commonly known as: DULCOLAX   ergocalciferol 1.25 MG (50000 UT) capsule Commonly known as: VITAMIN D2   methocarbamol 500 MG tablet Commonly known as: ROBAXIN   metoCLOPramide 5 MG tablet Commonly known as: REGLAN   sodium bicarbonate 650 MG tablet     TAKE these medications   acetaminophen 325 MG tablet Commonly known as: TYLENOL Take 1-2 tablets (325-650 mg total) by mouth every 4 (four) hours as needed for mild pain. What changed:   when to take this  reasons to take this   aspirin EC 81 MG tablet Take 81 mg by mouth daily.   atorvastatin 20 MG tablet Commonly known as: LIPITOR Take 20 mg by mouth daily.   calcitRIOL 0.5 MCG capsule Commonly known as: ROCALTROL Take 1 capsule (0.5 mcg total) by mouth 3 (three) times a week.   Darbepoetin Alfa 200 MCG/0.4ML Sosy injection Commonly known as: ARANESP Inject 0.4 mLs (200 mcg total) into the skin every Thursday at 6pm. Start taking on: April 23, 2019 What changed: when to take this   diphenhydrAMINE 12.5 MG/5ML elixir Commonly known as: BENADRYL Take 5-10 mLs (12.5-25 mg total) by mouth every 6 (six) hours as needed for itching.   docusate sodium 100 MG capsule Commonly known as: COLACE Take 1 capsule (100 mg total) by mouth 2 (two) times daily.   feeding supplement (PRO-STAT SUGAR FREE 64) Liqd Take 30 mLs by mouth 3 (three) times daily between meals.   gabapentin 300 MG capsule Commonly known as: NEURONTIN Take 1 capsule (300 mg total) by mouth at bedtime. What changed:   medication strength  how much to take  when to take this   gentamicin cream 0.1 % Commonly known as: GARAMYCIN Apply 1 application  topically daily.   HYDROcodone-acetaminophen 5-325 MG tablet---Rx 10 pills  Commonly known as: NORCO/VICODIN Take 1-2 tablets by mouth every 12 (twelve) hours as needed for severe pain. What changed:   when to take this  reasons to take this   insulin glargine 100 UNIT/ML injection Commonly known as: LANTUS Inject 0.2 mLs (20 Units total) into the skin daily.   milk and molasses Soln Place 240 mLs rectally daily as needed for severe constipation.   multivitamin Tabs tablet Take 1 tablet by mouth at bedtime.   prochlorperazine 5 MG tablet Commonly known as: COMPAZINE Take 1-2 tablets (5-10 mg total) by mouth every 6 (six) hours as needed for nausea.   senna 8.6 MG Tabs tablet Commonly known as: SENOKOT Take 1 tablet (8.6 mg total) by mouth 2 (two) times daily.   sevelamer carbonate 800 MG tablet Commonly known as: Renvela Take 4 tablets (3,200 mg total) by mouth  3 (three) times daily with meals. What changed: how much to take   traZODone 50 MG tablet Commonly known as: DESYREL Take 0.5-1 tablets (25-50 mg total) by mouth at bedtime as needed for sleep.       Contact information for follow-up providers    Lovorn, Jinny Blossom, MD Follow up.   Specialty: Physical Medicine and Rehabilitation Why: office will call you with follow up appointment Contact information: [address] [place] [address] [phone]        Newt Minion, MD Follow up on 04/27/2019.   Specialty: Orthopedic Surgery Why: appointment at 10 am with [facility], [place] for staple removal. Contact information: [place]  [address] [phone]            Contact information for after-discharge care    [facility] .   Service: Skilled Nursing Contact information: [address] [place] [phone]                  Signed: Bary Leriche 04/24/2019, 12:43 PM

## 2019-04-21 NOTE — Progress Notes (Signed)
Patient ID: Jorge [name], male   DOB: 1970/05/04, 49 y.o.   MRN: [id number] S: Feels well, no complaints. O:BP 99/60 (BP Location: Right Arm)   Pulse 65   Temp 97.9 F (36.6 C)   Resp 16   Ht 4\' 8"  (1.422 m)   Wt 68.4 kg   SpO2 100%   BMI 33.81 kg/m   Intake/Output Summary (Last 24 hours) at 04/21/2019 1115 Last data filed at 04/21/2019 0732 Gross per 24 hour  Intake 840 ml  Output 2003 ml  Net -1163 ml   Intake/Output: I/O last 3 completed shifts: In: 960 [P.O.:960] Out: 2128 [Urine:125; Other:2003]  Intake/Output this shift:  Total I/O In: 360 [P.O.:360] Out: -  Weight change: 1.5 kg Gen: NAD CVS: no rub Resp: cta Abd: benign, PD cath in place with new, clean dressing Ext: s/p bilateral BKA's, no edema  Recent Labs  Lab 04/15/19 0459 04/20/19 0537  NA 131* 135  K 4.4 4.6  CL 92* 96*  CO2 21* 21*  GLUCOSE 118* 105*  BUN 108* 94*  CREATININE 11.99* 10.06*  ALBUMIN 1.6* 1.7*  CALCIUM 8.3* 8.5*  PHOS 6.3* 6.5*   Liver Function Tests: Recent Labs  Lab 04/15/19 0459 04/20/19 0537  ALBUMIN 1.6* 1.7*   No results for input(s): LIPASE, AMYLASE in the last 168 hours. No results for input(s): AMMONIA in the last 168 hours. CBC: Recent Labs  Lab 04/15/19 0459 04/20/19 0537  WBC 8.3 9.4  NEUTROABS 4.8 6.0  HGB 7.9* 8.4*  HCT 25.6* 27.6*  MCV 96.2 95.2  PLT 210 261   Cardiac Enzymes: No results for input(s): CKTOTAL, CKMB, CKMBINDEX, TROPONINI in the last 168 hours. CBG: Recent Labs  Lab 04/20/19 1524 04/20/19 1616 04/20/19 1707 04/20/19 2129 04/21/19 0646  GLUCAP 69* 88 81 155* 123*    Iron Studies: No results for input(s): IRON, TIBC, TRANSFERRIN, FERRITIN in the last 72 hours. Studies/Results: No results found. Marland Kitchen amLODipine  5 mg Oral QHS  . aspirin EC  81 mg Oral Daily  . atorvastatin  20 mg Oral q1800  . calcitRIOL  0.5 mcg Oral Once per day on Mon Wed Fri  . Chlorhexidine Gluconate Cloth  6 each Topical Q12H  . cloNIDine  0.1 mg Oral  QHS  . darbepoetin (ARANESP) injection - NON-DIALYSIS  200 mcg Subcutaneous Q Thu-1800  . docusate sodium  100 mg Oral BID  . feeding supplement (PRO-STAT SUGAR FREE 64)  30 mL Oral TID BM  . gabapentin  300 mg Oral QHS  . gentamicin cream  1 application Topical Daily  . insulin aspart  0-5 Units Subcutaneous QHS  . insulin aspart  0-6 Units Subcutaneous TID WC  . insulin aspart  5 Units Subcutaneous TID WC  . insulin glargine  20 Units Subcutaneous Daily  . multivitamin  1 tablet Oral QHS  . senna  1 tablet Oral BID  . sevelamer carbonate  3,200 mg Oral TID WC    BMET    Component Value Date/Time   NA 135 04/20/2019 0537   K 4.6 04/20/2019 0537   CL 96 (L) 04/20/2019 0537   CO2 21 (L) 04/20/2019 0537   GLUCOSE 105 (H) 04/20/2019 0537   BUN 94 (H) 04/20/2019 0537   CREATININE 10.06 (H) 04/20/2019 0537   CALCIUM 8.5 (L) 04/20/2019 0537   GFRNONAA 5 (L) 04/20/2019 0537   GFRAA 6 (L) 04/20/2019 0537   CBC    Component Value Date/Time   WBC 9.4 04/20/2019 0537  RBC 2.90 (L) 04/20/2019 0537   HGB 8.4 (L) 04/20/2019 0537   HCT 27.6 (L) 04/20/2019 0537   PLT 261 04/20/2019 0537   MCV 95.2 04/20/2019 0537   MCH 29.0 04/20/2019 0537   MCHC 30.4 04/20/2019 0537   RDW 14.8 04/20/2019 0537   LYMPHSABS 1.7 04/20/2019 0537   MONOABS 1.0 04/20/2019 0537   EOSABS 0.5 04/20/2019 0537   BASOSABS 0.1 04/20/2019 0537     OPDialysis Orders: CCPD 7 Days weekly 80 kg 4 exchanges 1.6 mls Dwell time 1.5 hr, No pause or day dwells. Uses mostly 2.5% solution  BMD meds: -Renvela 800 mg 3 tabs PO TID with meals -Calcitriol 0.5 mcg PO TIW (MWF) -   Problem/Plan: 1. Gangrene/ bilateral Osteo feet with severe PVD-s/p bilateral BKA 3/17. In CIR.Ford/c to SNF. 2. ESRD - Azotemic, suspected from protein intake and marginal PD Rx.  transition from [facility] temporarily for discharge to SNF.Fernando Salinas by IR4/08.Renal coordinator notified for CLIP to outpatient center. Will resume  outpatient [facility] once stable for discharge from SNF.  Will need to flush and change dressing on PD catheter weekly.  A. Will continue with MWF schedule for now. 3. Hypertension/volume - BP controlled. Amlodipine10mg startedhere on3/13 Significantly under edw.Will stop amlodipine and follow BP's.  Does not appear volume overloaded. Will need new edw at d/c. Appears on nightly clonidine as outpt as well.  May need to taper meds if BP continues to drop.  4. Anemia - HGB initially 7.7 fell to 5.4on3/12 and transfused two units PRBCs. Heme neg 3/11 Ferritin still increased post surgery to 1420 with drop in tsat to 15% - s/p a short course of IV Fe (250 x 2) on Aranesp 200/ week - given qThurs. Transfuse as indicated.Note FOBT was checked and positive - per primary team. Pt states has hemorrhoids and no overt blood or dark stools per rectum.Hgb improved to 8.4.  5. Metabolic bone disease -[id number] on current regimen 6. DM - per PMD 7. Nutrition - Albumin low prostat, renal vits.-Renal/carb modified diet. Lactose intolerance noted. Donetta Potts, MD [employer] [phone]

## 2019-04-22 LAB — HEPATITIS B SURFACE ANTIGEN: Hepatitis B Surface Ag: NONREACTIVE

## 2019-04-22 LAB — GLUCOSE, CAPILLARY
Glucose-Capillary: 38 mg/dL — CL (ref 70–99)
Glucose-Capillary: 48 mg/dL — ABNORMAL LOW (ref 70–99)
Glucose-Capillary: 116 mg/dL — ABNORMAL HIGH (ref 70–99)
Glucose-Capillary: 201 mg/dL — ABNORMAL HIGH (ref 70–99)
Glucose-Capillary: 104 mg/dL — ABNORMAL HIGH (ref 70–99)
Glucose-Capillary: 74 mg/dL (ref 70–99)
Glucose-Capillary: 131 mg/dL — ABNORMAL HIGH (ref 70–99)
Glucose-Capillary: 130 mg/dL — ABNORMAL HIGH (ref 70–99)

## 2019-04-22 MED ORDER — PROCHLORPERAZINE MALEATE 5 MG PO TABS: 5.0000 mg | ORAL_TABLET | Freq: Four times a day (QID) | ORAL | 0 refills | Status: DC | PRN

## 2019-04-22 MED ORDER — HYDROCORTISONE ACETATE 25 MG RE SUPP: 25.0000 mg | Freq: Two times a day (BID) | RECTAL | 0 refills | Status: DC

## 2019-04-22 NOTE — Progress Notes (Signed)
Recreational Therapy Discharge Summary Patient Details  Name: Jorge [name] MRN: [id number] Date of Birth: September 17, 1970 Today's Date: 04/22/2019  Long term goals set: 1  Long term goals met:  1  Comments on progress toward goals: Pt has made good progress during LOS and is discharging to SNF today for continued therapy and 24 hour supervision/assitance.  TR sessions focused on activity tolerance, safety, dynamic balance and community reintegration.  Pt is supervision level for seated TR tasks and community reintegration w/c level.  Pt remains motivated to regain further independence.  Goals met.  Reasons for discharge: discharge from hospital Patient/family agrees with progress made and goals achieved: Yes  Jorge [name] 04/22/2019, 8:55 AM

## 2019-04-22 NOTE — Plan of Care (Signed)
  Problem: Consults Goal: RH GENERAL PATIENT EDUCATION Description: See Patient Education module for education specifics. Outcome: Completed/Met Goal: Skin Care Protocol Initiated - if Braden Score 18 or less Description: If consults are not indicated, leave blank or document N/A Outcome: Completed/Met Goal: Nutrition Consult-if indicated Outcome: Completed/Met Goal: Diabetes Guidelines if Diabetic/Glucose > 140 Description: If diabetic or lab glucose is > 140 mg/dl - Initiate Diabetes/Hyperglycemia Guidelines & Document Interventions  Outcome: Completed/Met   Problem: RH BOWEL ELIMINATION Goal: RH STG MANAGE BOWEL WITH ASSISTANCE Description: STG Manage Bowel with min Assistance. Outcome: Completed/Met Goal: RH STG MANAGE BOWEL W/MEDICATION W/ASSISTANCE Description: STG Manage Bowel with Medication with mod I Assistance. Outcome: Completed/Met   Problem: RH BLADDER ELIMINATION Goal: RH STG MANAGE BLADDER WITH ASSISTANCE Description: STG Manage Bladder With mod I Assistance Outcome: Completed/Met

## 2019-04-22 NOTE — Progress Notes (Signed)
Renal Navigator notified by CIR CSW that patient's discharge is delayed due to his daughter's COVID positive status- newly reported to staff with her recent visit to patient yesterday. This information has been passed on to OP HD clinic and Navigator will follow for discharge and update clinic appropriately.  Alphonzo Cruise, [place] Renal Navigator [phone]

## 2019-04-22 NOTE — Progress Notes (Signed)
[facility] PHYSICAL MEDICINE & REHABILITATION PROGRESS NOTE   Subjective/Complaints:  No more nausea since taking meds with some food. Ready for d/c- however daughter came to see pt yesterday- didn't wear a mask- is COVID (+) per daughter and her mother.   Will hold his d/c for 2 days and test him Thursday for Aceitunas. If negative, can go to SNF as planned  ROS:   Pt denies SOB, abd pain, CP, N/V/C/D, and vision changes   Objective:   No results found. Recent Labs    04/20/19 0537  WBC 9.4  HGB 8.4*  HCT 27.6*  PLT 261   Recent Labs    04/20/19 0537  NA 135  K 4.6  CL 96*  CO2 21*  GLUCOSE 105*  BUN 94*  CREATININE 10.06*  CALCIUM 8.5*    Intake/Output Summary (Last 24 hours) at 04/22/2019 1910 Last data filed at 04/22/2019 0900 Gross per 24 hour  Intake 240 ml  Output 400 ml  Net -160 ml     Physical Exam: Vital Signs Blood pressure (!) 125/42, pulse 71, temperature 97.8 F (36.6 C), temperature source Oral, resp. rate 19, height 4\' 8"  (1.422 m), weight 68.4 kg, SpO2 100 %.  Constitutional:pt awake, sitting up in bed; smiling, NAD asking to remove LUE IV HEENT:  conjugate gaze Neck: supple Cardiovascular: RRR- R chest catheter in place Respiratory/Chest: CTA B/L- good air movement GI/Abdomen: soft, NT, ND, (+)BS; dressing over PD cath in abdomen.  Ext: no clubbing, cyanosis, or edema Psych: appropriate Musc: B/L BKAs- assessed B/L- skin very dry flaking- otherwise staples intact- look great- no drainage- improved edema- not assessed today Neuro: Ox3 Motor:  Skin: IV in LUE-no signs of infiltration Bilateral lower extremities: Hip flexion, knee extension 5/5, unchanged  Assessment/Plan: 1. Functional deficits secondary to B/L BKAs due to gangrene/osteomyelitis which require 3+ hours per day of interdisciplinary therapy in a comprehensive inpatient rehab setting.  Physiatrist is providing close team supervision and 24 hour management of active medical  problems listed below.  Physiatrist and rehab team continue to assess barriers to discharge/monitor patient progress toward functional and medical goals  Care Tool:  Bathing    Body parts bathed by patient: Right arm, Left arm, Chest, Abdomen, Face, Front perineal area, Right upper leg, Left upper leg, Buttocks   Body parts bathed by helper: Buttocks Body parts n/a: Right lower leg, Left lower leg   Bathing assist Assist Level: Set up assist     Upper Body Dressing/Undressing Upper body dressing   What is the patient wearing?: Pull over shirt    Upper body assist Assist Level: Independent    Lower Body Dressing/Undressing Lower body dressing      What is the patient wearing?: Underwear/pull up, Pants     Lower body assist Assist for lower body dressing: Set up assist     Toileting Toileting    Toileting assist Assist for toileting: Set up assist     Transfers Chair/bed transfer  Transfers assist     Chair/bed transfer assist level: Supervision/Verbal cueing     Locomotion Ambulation   Ambulation assist   Ambulation activity did not occur: N/A(B BKA)          Walk 10 feet activity   Assist  Walk 10 feet activity did not occur: N/A        Walk 50 feet activity   Assist Walk 50 feet with 2 turns activity did not occur: N/A  Walk 150 feet activity   Assist Walk 150 feet activity did not occur: N/A         Walk 10 feet on uneven surface  activity   Assist Walk 10 feet on uneven surfaces activity did not occur: N/A         Wheelchair     Assist Will patient use wheelchair at discharge?: Yes Type of Wheelchair: Manual    Wheelchair assist level: Independent Max wheelchair distance: 150    Wheelchair 50 feet with 2 turns activity    Assist        Assist Level: Independent   Wheelchair 150 feet activity     Assist      Assist Level: Independent   Blood pressure (!) 125/42, pulse 71,  temperature 97.8 F (36.6 C), temperature source Oral, resp. rate 19, height 4\' 8"  (1.422 m), weight 68.4 kg, SpO2 100 %.  Medical Problem List and Plan: 1.Impaired function/ADLs/mobilitysecondary to B/L BKAs due to osteomyelitis and gangrene- s/p IV ABX- completed 3/18  Continue CIR  2. Antithrombotics: -DVT/anticoagulation:Pharmaceutical:Heparindue to PD/ESRD -antiplatelet therapy:  3. Pain Management:Hydrocodone as needed- per pt, pain meds are suitable/working  4/4 increase gabapentin to 200mg  qhs for phantom limb pain  4/5- pt reports pain adequately managed  4/9- will increase gabapentin to 300 mg QHS. Max dose can do - if doesn't work, can switch to [employer].   4/12- pt very happy with increase in gabapentin- wants to continue dose  4/14- pt asking of can have more gabapentin on non HD days- explained at max dose for his renal function.  4. Mood: LCSW to follow for evaluation and support -antipsychotic agents:  5. Neuropsych: This patientiscapable of making decisions onhis own behalf. 6. Skin/Wound Care:   Will need to monitor incisions closely given recent fall  4/5- BKAs look OK considering 2 falls  4/6- skin looking good- plans on wearing shrinkers today (didn't yesterday)  4/7- can take off shrinkers for a few hours max/day.   4/13- BKAs look great- staples out per vascular timeline 7. Fluids/Electrolytes/Nutrition:Strict I's and O's. Monitor weights daily after PD. 8.  ESRD: Continue CCPD daily.Is oliguric, not anuric- voids ~2x/day  4/7- need to go to Hemodialysis since going to SNF  4/8- going today for HD catheter- is NPO  4/13- did HD yesterday- went well- tolerating  4/14- HD now to be T/H/S 9.Acute on chronic anemia: On ESA weekly.   Hemoglobin 7.4 on 3/26, continue to monitor  3/31- Last Hb 7/8- will recheck tomorrow  4/1- Hb 7.5- stable  4/5- Hb 8.0- better but variable  4/7- Hb 7.9- con't meds  4/9- labs Monday  4/12- Hb  up to 8.4! 10.Nutrition: Severe protein malnutrition-Renal/carb modified diet to continue with prostat due to low calorie/proteinmalnutrition. 11. PYK:DXIPJAS blood pressures twice daily. Off hydralazine and furosemide at this time.   Con't clonidine and Norvasc   4/8- BP a little soft 105/60s- con't meds  4/10: BP improved, 4/11: still a little soft.   4/12- BP 130s/70s- doing better 12.T2DM with hyperglycemia: A1c 7.8-Continue Lantus daily--d/c meal coverage due to hypoglycemic episodes.Monitor blood sugars AC at bedtime for now due to hypoglycemia.   CBG (last 3)  Recent Labs    04/22/19 0924 04/22/19 1125 04/22/19 1639  GLUCAP 104* 74 131*   Lantus decreased to 15 units on 3/27 (already received a.m. dose)  Labile with hypoglycemia on 3/28.  Will need to monitor closely overnight.  Bedtime snack ordered for tonight.   3/30-  DM coord asked me to increase Lantus to 18 units-   4/3: cbgs remain elevated, labile. Treating and highs and lows which causes rebound effect  4/4 Lantus 20u daily started today, increase mealtime covg to 5u  4/8- BG a little low- is NPO this AM- if drops, will need IVFs D5NS  4/9- BGs labile due to being NPO most of yesterday- con't regimen  4/10: continue to be labile  4/11: well controlled  4/12- had low BG this AM of 69- otherwise well controlled  4/13- BGs well controlled- con't regimen  4/14- had AM BG of 38- held meal coverage as of today.  14. Leukocytosis: Resolved  3/30- WBC down to 8.9 from 13 yesterday 15. Hyperkalemia  3/30- up to 5.3- will see if renal will address since renal pt- if not, will try Lokelma.  3/31- Renal ordered Lokelma x1- wil recheck labs in AM  4/1- K+ down to 4.4- con't regimen 16. Constipation  3/31- will order Sorbitol 60 ml then follow in 3-4 hours with enema.   4/1- cleaned out- reminded pt to NOT refuse bowel meds  4/4 reiterating use/need of preventative bowel meds 17. LLQ Hematoma  4/2- will monitor  hematoma- explained to try and avoid injecting into it with SQ Heparin- if not improving, will order imaging next week.  4/5- is getting smaller- was small plum, now large grape size.   4/14- getting smaller- is now blueberry sized 18. [name]  4/5- pt seems to think can go home, but is min assist for transfers, not Mod I or even supervision- team is trying to address.  4/6- pt emphatic that will have family at home- - won't entertain any other ideas.   4/8- going to SNF once gets a bed- and changing to HD  4/9- signed FL2- HD catheter placed- will see if needs to do here, or staying with PD while here. Will check with renal.   4/11: appreciate renal note: transitioned to HD temporarily in preparation for DC to SNF  4/12- d/c tomorrow to SNF?  4/13- d/c to SNF tomorrow? Nausea today- rec nausea meds that are ordered.   4/14- pt to go to SNF Friday if doesn't get COVID, test (+) for daughter- on droplet precautions as of today.   LOS: 22 days A FACE TO FACE EVALUATION WAS PERFORMED  Jorge [name] 04/22/2019, 7:10 PM

## 2019-04-22 NOTE — Significant Event (Signed)
Hypoglycemic Event  CBG: 48  Treatment: 8oz sprite; peanut butter and crackers  Symptoms: shakiness  Follow-up CBG: Time: 03:35 CBG Result: 116  Possible Reasons for Event: fell asleep before eating bedtime snack  Comments/MD notified: hypoglycemic algorithm followed. Significant event note written.    Neysa Hotter

## 2019-04-22 NOTE — Progress Notes (Signed)
Patient ID: Cecilie Kicks, male   DOB: 25-Oct-1970, 49 y.o.   MRN: [id number] S: Feels well, no complaints.  Being discharged to SNF today and will have outpatient HD tomorrow at [facility]. O:BP (!) 113/49 (BP Location: Left Arm)   Pulse 75   Temp 97.8 F (36.6 C) (Oral)   Resp 16   Ht 4\' 8"  (1.422 m)   Wt 68.4 kg   SpO2 100%   BMI 33.81 kg/m   Intake/Output Summary (Last 24 hours) at 04/22/2019 1009 Last data filed at 04/22/2019 0900 Gross per 24 hour  Intake 720 ml  Output 900 ml  Net -180 ml   Intake/Output: I/O last 3 completed shifts: In: 840 [P.O.:840] Out: 900 [Urine:900]  Intake/Output this shift:  Total I/O In: 240 [P.O.:240] Out: -  Weight change:  Gen: NAD CVS: no rub Resp: cta Abd: +BS, soft, NT/ND PD cath in LLQ, dressing in place Ext: s/p bilateral BKA's, no edema  Recent Labs  Lab 04/20/19 0537  NA 135  K 4.6  CL 96*  CO2 21*  GLUCOSE 105*  BUN 94*  CREATININE 10.06*  ALBUMIN 1.7*  CALCIUM 8.5*  PHOS 6.5*   Liver Function Tests: Recent Labs  Lab 04/20/19 0537  ALBUMIN 1.7*   No results for input(s): LIPASE, AMYLASE in the last 168 hours. No results for input(s): AMMONIA in the last 168 hours. CBC: Recent Labs  Lab 04/20/19 0537  WBC 9.4  NEUTROABS 6.0  HGB 8.4*  HCT 27.6*  MCV 95.2  PLT 261   Cardiac Enzymes: No results for input(s): CKTOTAL, CKMB, CKMBINDEX, TROPONINI in the last 168 hours. CBG: Recent Labs  Lab 04/21/19 2123 04/22/19 0248 04/22/19 0312 04/22/19 0335 04/22/19 0635  GLUCAP 122* 38* 48* 116* 201*    Iron Studies: No results for input(s): IRON, TIBC, TRANSFERRIN, FERRITIN in the last 72 hours. Studies/Results: No results found. Marland Kitchen aspirin EC  81 mg Oral Daily  . atorvastatin  20 mg Oral q1800  . calcitRIOL  0.5 mcg Oral Once per day on Mon Wed Fri  . Chlorhexidine Gluconate Cloth  6 each Topical Q12H  . darbepoetin (ARANESP) injection - NON-DIALYSIS  200 mcg Subcutaneous Q Thu-1800  . docusate sodium  100 mg  Oral BID  . feeding supplement (PRO-STAT SUGAR FREE 64)  30 mL Oral TID BM  . gabapentin  300 mg Oral QHS  . gentamicin cream  1 application Topical Daily  . insulin aspart  0-5 Units Subcutaneous QHS  . insulin aspart  0-6 Units Subcutaneous TID WC  . insulin glargine  20 Units Subcutaneous Daily  . multivitamin  1 tablet Oral QHS  . senna  1 tablet Oral BID  . sevelamer carbonate  3,200 mg Oral TID WC    BMET    Component Value Date/Time   NA 135 04/20/2019 0537   K 4.6 04/20/2019 0537   CL 96 (L) 04/20/2019 0537   CO2 21 (L) 04/20/2019 0537   GLUCOSE 105 (H) 04/20/2019 0537   BUN 94 (H) 04/20/2019 0537   CREATININE 10.06 (H) 04/20/2019 0537   CALCIUM 8.5 (L) 04/20/2019 0537   GFRNONAA 5 (L) 04/20/2019 0537   GFRAA 6 (L) 04/20/2019 0537   CBC    Component Value Date/Time   WBC 9.4 04/20/2019 0537   RBC 2.90 (L) 04/20/2019 0537   HGB 8.4 (L) 04/20/2019 0537   HCT 27.6 (L) 04/20/2019 0537   PLT 261 04/20/2019 0537   MCV 95.2 04/20/2019 0537  MCH 29.0 04/20/2019 0537   MCHC 30.4 04/20/2019 0537   RDW 14.8 04/20/2019 0537   LYMPHSABS 1.7 04/20/2019 0537   MONOABS 1.0 04/20/2019 0537   EOSABS 0.5 04/20/2019 0537   BASOSABS 0.1 04/20/2019 0537     OPDialysis Orders: CCPD 7 Days weekly EDW was 80 kg 4 exchanges 1.6 mls Dwell time 1.5 hr, No pause or day dwells. Uses mostly 2.5% solution  BMD meds: -Renvela 800 mg 3 tabs PO TID with meals -Calcitriol 0.5 mcg PO TIW (MWF) -   Problem/Plan: 1. Gangrene/ bilateral Osteo feet with severe PVD-s/p bilateral BKA 3/17. In CIR.Ford/c to SNF. 2. ESRD - Azotemic, suspected from protein intake and marginal PD Rx.  transition fromPDto HD temporarily for discharge to SNF.[place] by IR4/08. Will resume outpatient CCPD once stable for discharge from SNF.  1. Accepted at [facility] on T[facility] schedule and will go to outpatient HD tomorrow and have weekly  PD catheter flushes and dressing changes. 2. For discharge  today. 3. Hypertension/volume - BP controlled. Amlodipine10mg startedhere on3/13 Significantly under edw. remains hypotensive and agree with stopping clonidine as well. 1. New EDW is 68.5 kg 4. Anemia - HGB initially 7.7 fell to 5.4on3/12 and transfused two units PRBCs. Heme neg 3/11 Ferritin still increased post surgery to 1420 with drop in tsat to 15% - s/p a short course of IV Fe (250 x 2) on Aranesp 200/ week - given qThurs. Transfuse as indicated.Note FOBT was checked and positive - per primary team. Pt states has hemorrhoids and no overt blood or dark stools per rectum.Hgb improved to 8.4. 5. Metabolic bone disease -[id number] on current regimen 6. DM - per PMD 7. Nutrition - Albumin low prostat, renal vits.-Renal/carb modified diet. Lactose intolerance noted.   Donetta Potts, MD [employer] [phone]

## 2019-04-22 NOTE — Significant Event (Addendum)
Hypoglycemic Event  CBG: 38  Treatment: 8 oz Sprite, peanut butter and graham crackers  Symptoms: shakiness, sweating  Follow-up CBG: Time: 03:12 CBG Result: 48  Possible Reasons for Event: fell asleep before eating hs snack  Comments/MD notified: hypoglycemic algorithm followed; will repeat until acceptable range    Neysa Hotter

## 2019-04-22 NOTE — Progress Notes (Signed)
Report to Morene Antu at [phone]

## 2019-04-22 NOTE — Progress Notes (Signed)
Renal Navigator notified OP HD clinic/[facility] of patient's discharge today and asked Renal NP to send orders.  Alphonzo Cruise, [place] Renal Navigator [phone]

## 2019-04-22 NOTE — Progress Notes (Addendum)
Social Work Patient ID: Jorge [name], male   DOB: 05-15-1970, 49 y.o.   MRN: [id number]   SW spoke with Tanya/Admissions with [facility] [phone]) stating will be able to pick up pt today at 1:30pm. SW reviewed pt dialysis schedule for tomorrow only with an arrival time of 11:30am at [facility]. SW updated pt and medical team on pt pick up.   SW cancelled 11am ambulance pick up with Pearn/[facility] [phone]).  *Updated reports from charge RN pt reported that his daughter came to visit him yesterday, and pt was just informed she tested positive for COVID. SW confirmed reports with pt sister Langley Gauss. SW spoke with Tanya/Admissions at [facility] to discuss reports. SW was informed that pt will need to remain here in hospital, and be re-tested for COVID within 48hrs. Pt will need new COVID test Friday morning. If results are negative, pt can d/c to SNF. SW informed medical team on all changes with pt d/c. SW called Colleen/Dialysis Coord [phone]) on changes in pt d/c and request to have pt be placed back on dialysis schedule. This SW will follow-up if there are any changes. SW met with pt in room to inform on above. SW called pt sister Langley Gauss [phone]) to inform on above, however, unable to leave a message as mailbox full.  *SW received return phone. SW provided updates. No questions/concerns at this time.   Loralee Pacas, MSW, [place] Office: [phone] Cell: [phone] Fax: [phone]

## 2019-04-23 LAB — GLUCOSE, CAPILLARY
Glucose-Capillary: 73 mg/dL (ref 70–99)
Glucose-Capillary: 125 mg/dL — ABNORMAL HIGH (ref 70–99)
Glucose-Capillary: 44 mg/dL — CL (ref 70–99)
Glucose-Capillary: 131 mg/dL — ABNORMAL HIGH (ref 70–99)
Glucose-Capillary: 25 mg/dL — CL (ref 70–99)
Glucose-Capillary: 28 mg/dL — CL (ref 70–99)
Glucose-Capillary: 35 mg/dL — CL (ref 70–99)
Glucose-Capillary: 43 mg/dL — CL (ref 70–99)
Glucose-Capillary: 120 mg/dL — ABNORMAL HIGH (ref 70–99)
Glucose-Capillary: 60 mg/dL — ABNORMAL LOW (ref 70–99)

## 2019-04-23 LAB — RESPIRATORY PANEL BY RT PCR (FLU A&B, COVID)
Influenza A by PCR: NEGATIVE
Influenza B by PCR: NEGATIVE
SARS Coronavirus 2 by RT PCR: NEGATIVE

## 2019-04-23 LAB — SARS CORONAVIRUS 2 (TAT 6-24 HRS): SARS Coronavirus 2: NEGATIVE

## 2019-04-23 MED ORDER — DARBEPOETIN ALFA 200 MCG/0.4ML IJ SOSY
PREFILLED_SYRINGE | INTRAMUSCULAR | Status: AC
  Filled 2019-04-23: qty 0.4

## 2019-04-23 MED ORDER — GLUCOSE 40 % PO GEL
ORAL | Status: AC
  Administered 2019-04-23: 75 via ORAL
  Filled 2019-04-23: qty 1

## 2019-04-23 MED ORDER — HYDROCODONE-ACETAMINOPHEN 5-325 MG PO TABS
ORAL_TABLET | ORAL | Status: AC
  Administered 2019-04-23: 1
  Filled 2019-04-23: qty 1

## 2019-04-23 MED ORDER — HEPARIN SODIUM (PORCINE) 1000 UNIT/ML IJ SOLN
INTRAMUSCULAR | Status: AC
  Filled 2019-04-23: qty 4

## 2019-04-23 MED ORDER — GLUCOSE 40 % PO GEL
2.0000 | ORAL | Status: AC
  Administered 2019-04-23: 75 g via ORAL

## 2019-04-23 NOTE — Progress Notes (Signed)
Patient ID: Jorge [name], male   DOB: 12-Dec-1970, 49 y.o.   MRN: [id number] S: No fevers, chills, loss of smell/taste, nor myalgias.  O:BP (!) 140/44   Pulse 66   Temp 98 F (36.7 C)   Resp 17   Ht 4\' 8"  (1.422 m)   Wt 68.4 kg   SpO2 100%   BMI 33.81 kg/m   Intake/Output Summary (Last 24 hours) at 04/23/2019 1113 Last data filed at 04/23/2019 1047 Gross per 24 hour  Intake 720 ml  Output 575 ml  Net 145 ml   Intake/Output: I/O last 3 completed shifts: In: 720 [P.O.:720] Out: 575 [Urine:575]  Intake/Output this shift:  Total I/O In: 240 [P.O.:240] Out: 400 [Urine:400] Weight change:  Gen: NAD Ext no edema  Recent Labs  Lab 04/20/19 0537  NA 135  K 4.6  CL 96*  CO2 21*  GLUCOSE 105*  BUN 94*  CREATININE 10.06*  ALBUMIN 1.7*  CALCIUM 8.5*  PHOS 6.5*   Liver Function Tests: Recent Labs  Lab 04/20/19 0537  ALBUMIN 1.7*   No results for input(s): LIPASE, AMYLASE in the last 168 hours. No results for input(s): AMMONIA in the last 168 hours. CBC: Recent Labs  Lab 04/20/19 0537  WBC 9.4  NEUTROABS 6.0  HGB 8.4*  HCT 27.6*  MCV 95.2  PLT 261   Cardiac Enzymes: No results for input(s): CKTOTAL, CKMB, CKMBINDEX, TROPONINI in the last 168 hours. CBG: Recent Labs  Lab 04/22/19 0924 04/22/19 1125 04/22/19 1639 04/22/19 2114 04/23/19 0602  GLUCAP 104* 74 131* 130* 73    Iron Studies: No results for input(s): IRON, TIBC, TRANSFERRIN, FERRITIN in the last 72 hours. Studies/Results: No results found. Marland Kitchen aspirin EC  81 mg Oral Daily  . atorvastatin  20 mg Oral q1800  . calcitRIOL  0.5 mcg Oral Once per day on Mon Wed Fri  . Chlorhexidine Gluconate Cloth  6 each Topical Q12H  . darbepoetin (ARANESP) injection - NON-DIALYSIS  200 mcg Subcutaneous Q Thu-1800  . docusate sodium  100 mg Oral BID  . feeding supplement (PRO-STAT SUGAR FREE 64)  30 mL Oral TID BM  . gabapentin  300 mg Oral QHS  . gentamicin cream  1 application Topical Daily  . insulin  aspart  0-5 Units Subcutaneous QHS  . insulin aspart  0-6 Units Subcutaneous TID WC  . insulin glargine  20 Units Subcutaneous Daily  . multivitamin  1 tablet Oral QHS  . senna  1 tablet Oral BID  . sevelamer carbonate  3,200 mg Oral TID WC    BMET    Component Value Date/Time   NA 135 04/20/2019 0537   K 4.6 04/20/2019 0537   CL 96 (L) 04/20/2019 0537   CO2 21 (L) 04/20/2019 0537   GLUCOSE 105 (H) 04/20/2019 0537   BUN 94 (H) 04/20/2019 0537   CREATININE 10.06 (H) 04/20/2019 0537   CALCIUM 8.5 (L) 04/20/2019 0537   GFRNONAA 5 (L) 04/20/2019 0537   GFRAA 6 (L) 04/20/2019 0537   CBC    Component Value Date/Time   WBC 9.4 04/20/2019 0537   RBC 2.90 (L) 04/20/2019 0537   HGB 8.4 (L) 04/20/2019 0537   HCT 27.6 (L) 04/20/2019 0537   PLT 261 04/20/2019 0537   MCV 95.2 04/20/2019 0537   MCH 29.0 04/20/2019 0537   MCHC 30.4 04/20/2019 0537   RDW 14.8 04/20/2019 0537   LYMPHSABS 1.7 04/20/2019 0537   MONOABS 1.0 04/20/2019 0537   EOSABS 0.5  04/20/2019 0537   BASOSABS 0.1 04/20/2019 0537   OPDialysis Orders: CCPD 7 Days weekly EDW was 80 kg 4 exchanges 1.6 mls Dwell time 1.5 hr, No pause or day dwells. Uses mostly 2.5% solution  BMD meds: -Renvela 800 mg 3 tabs PO TID with meals -Calcitriol 0.5 mcg PO TIW (MWF) -   Problem/Plan: 1. Gangrene/ bilateral Osteo feet with severe PVD-s/p bilateral BKA 3/17. In CIR.Ford/c to SNF. 2. ESRD - Azotemic, suspected from protein intake and marginal PD Rx.  transition fromPDto HD temporarily for discharge to SNF.[place] by IR4/08. Will resume outpatient [facility]CPD once stable fordischarge from SNF. 1. Accepted at [facility] on TTS schedule and will go to outpatient HD tomorrow and have weekly  PD catheter flushes and dressing changes. 2. For discharge today. 3. Hypertension/volume - BP controlled. Amlodipine10mg startedhere on3/13 Significantly under edw. remains hypotensive and agree with stopping clonidine as well. 1. New EDW is  68.5 kg 4. Anemia - HGB initially 7.7 fell to 5.4on3/12 and transfused two units PRBCs. Heme neg 3/11 Ferritin still increased post surgery to 1420 with drop in tsat to 15% - s/p a short course of IV Fe (250 x 2) on Aranesp 200/ week - given qThurs. Transfuse as indicated.Note FOBT was checked and positive - per primary team. Pt states has hemorrhoids and no overt blood or dark stools per rectum.Hgb improved to 8.4. 5. Metabolic bone disease -[id number] on current regimen 6. DM - per PMD 7. Nutrition - Albumin low prostat, renal vits.-Renal/carb modified diet. Lactose intolerance noted. 8.  Disposition- was to be discharged yesterday but held up due to covid exposure from his daughter on 04/21/19.  SNF wants a repeat negative test on 04/24/19 before they will accept him.  He did receive his first Moderna covid vaccine on 03/09/19 and was due for a second on 04/08/19 but was in the hospital so missed the second dose. Repeating rapid test today to clear for going up to HD here in the hospital.  He will need another one tomorrow for SNF.    Donetta Potts, MD [employer] [phone]

## 2019-04-23 NOTE — Significant Event (Signed)
Hypoglycemic Event  CBG: 60  Treatment: 2 tubes glucose gel  Symptoms: Sweaty  Follow-up CBG: Time: 1104 CBG Result: 120  Possible Reasons for Event: Inadequate meal intake  Comments: Patient left off floor to go to dialysis, was not offered a meal and did not eat prior or during, came back to floor, no tray saved, see previous notes   [employer]

## 2019-04-23 NOTE — Progress Notes (Signed)
Hypoglycemic Event  CBG: 44  Treatment: 8 oz juice/soda  Symptoms: Sweaty  Follow-up CBG: Time: 1215 CBG Result: 125  Possible Reasons for Event: Inadequate meal intake  Comments/MD notified: Pam Love notified, no new orders    [employer]

## 2019-04-23 NOTE — Significant Event (Signed)
Hypoglycemic Event  CBG: 27  Treatment: Glucose gel  Symptoms: Shakey  Follow-up CBG: Time:945 CBG Result: 47  Possible Reasons for Event: Didn't eat dinner and did not eat in dialysis      [employer]

## 2019-04-23 NOTE — Progress Notes (Signed)
[facility] PHYSICAL MEDICINE & REHABILITATION PROGRESS NOTE   Subjective/Complaints:  Pt reports he was exposed to [place] by daughter giving him a hug/kiss 2 days ago without mask- said she was wearing the rest of time- she is COVID (+)- found out yesterday.  Otherwise, doing well feels fine  ROS:   Pt denies SOB, abd pain, CP, N/V/C/D, and vision changes    Objective:   No results found. No results for input(s): WBC, HGB, HCT, PLT in the last 72 hours. No results for input(s): NA, K, CL, CO2, GLUCOSE, BUN, CREATININE, CALCIUM in the last 72 hours.  Intake/Output Summary (Last 24 hours) at 04/23/2019 0921 Last data filed at 04/23/2019 0820 Gross per 24 hour  Intake 720 ml  Output 175 ml  Net 545 ml     Physical Exam: Vital Signs Blood pressure (!) 140/44, pulse 66, temperature 98 F (36.7 C), resp. rate 17, height 4\' 8"  (1.422 m), weight 68.4 kg, SpO2 100 %.  Constitutional: pt awake, alert, appears well, afebrile; NAD HEENT:  conjugate gaze Neck: supple Cardiovascular: RRR_ R HD catheter in chest Respiratory/Chest: CTA B/L GI/Abdomen: soft, NT, ND, (+)BS-  PD catheter site covered in abd Ext: no clubbing, cyanosis, or edema Psych: appropriate Musc: B/L BKAs- assessed B/L- skin very dry flaking- otherwise staples intact- look great- no drainage- improved edema- not assessed today Neuro: Ox3 Motor:  Skin: IV in LUE-no signs of infiltration Bilateral lower extremities: Hip flexion, knee extension 5/5, unchanged  Assessment/Plan: 1. Functional deficits secondary to B/L BKAs due to gangrene/osteomyelitis which require 3+ hours per day of interdisciplinary therapy in a comprehensive inpatient rehab setting.  Physiatrist is providing close team supervision and 24 hour management of active medical problems listed below.  Physiatrist and rehab team continue to assess barriers to discharge/monitor patient progress toward functional and medical goals  Care  Tool:  Bathing    Body parts bathed by patient: Right arm, Left arm, Chest, Abdomen, Face, Front perineal area, Right upper leg, Left upper leg, Buttocks   Body parts bathed by helper: Buttocks Body parts n/a: Right lower leg, Left lower leg   Bathing assist Assist Level: Set up assist     Upper Body Dressing/Undressing Upper body dressing   What is the patient wearing?: Pull over shirt    Upper body assist Assist Level: Independent    Lower Body Dressing/Undressing Lower body dressing      What is the patient wearing?: Underwear/pull up, Pants     Lower body assist Assist for lower body dressing: Set up assist     Toileting Toileting    Toileting assist Assist for toileting: Set up assist     Transfers Chair/bed transfer  Transfers assist     Chair/bed transfer assist level: Supervision/Verbal cueing     Locomotion Ambulation   Ambulation assist   Ambulation activity did not occur: N/A(B BKA)          Walk 10 feet activity   Assist  Walk 10 feet activity did not occur: N/A        Walk 50 feet activity   Assist Walk 50 feet with 2 turns activity did not occur: N/A         Walk 150 feet activity   Assist Walk 150 feet activity did not occur: N/A         Walk 10 feet on uneven surface  activity   Assist Walk 10 feet on uneven surfaces activity did not occur: N/A  Wheelchair     Assist Will patient use wheelchair at discharge?: Yes Type of Wheelchair: Manual    Wheelchair assist level: Independent Max wheelchair distance: 150    Wheelchair 50 feet with 2 turns activity    Assist        Assist Level: Independent   Wheelchair 150 feet activity     Assist      Assist Level: Independent   Blood pressure (!) 140/44, pulse 66, temperature 98 F (36.7 C), resp. rate 17, height 4\' 8"  (1.422 m), weight 68.4 kg, SpO2 100 %.  Medical Problem List and Plan: 1.Impaired  function/ADLs/mobilitysecondary to B/L BKAs due to osteomyelitis and gangrene- s/p IV ABX- completed 3/18  Continue CIR  2. Antithrombotics: -DVT/anticoagulation:Pharmaceutical:Heparindue to PD/ESRD -antiplatelet therapy:  3. Pain Management:Hydrocodone as needed- per pt, pain meds are suitable/working  4/4 increase gabapentin to 200mg  qhs for phantom limb pain  4/5- pt reports pain adequately managed  4/9- will increase gabapentin to 300 mg QHS. Max dose can do - if doesn't work, can switch to [employer].   4/12- pt very happy with increase in gabapentin- wants to continue dose  4/14- pt asking of can have more gabapentin on non HD days- explained at max dose for his renal function.  4. Mood: LCSW to follow for evaluation and support -antipsychotic agents:  5. Neuropsych: This patientiscapable of making decisions onhis own behalf. 6. Skin/Wound Care:   Will need to monitor incisions closely given recent fall  4/5- BKAs look OK considering 2 falls  4/6- skin looking good- plans on wearing shrinkers today (didn't yesterday)  4/7- can take off shrinkers for a few hours max/day.   4/13- BKAs look great- staples out per vascular timeline 7. Fluids/Electrolytes/Nutrition:Strict I's and O's. Monitor weights daily after PD. 8.  ESRD: Continue CCPD daily.Is oliguric, not anuric- voids ~2x/day  4/7- need to go to Hemodialysis since going to SNF  4/8- going today for HD catheter- is NPO  4/13- did HD yesterday- went well- tolerating  4/14- HD now to be T/H/S 9.Acute on chronic anemia: On ESA weekly.   Hemoglobin 7.4 on 3/26, continue to monitor  3/31- Last Hb 7/8- will recheck tomorrow  4/1- Hb 7.5- stable  4/5- Hb 8.0- better but variable  4/7- Hb 7.9- con't meds  4/9- labs Monday  4/12- Hb up to 8.4! 10.Nutrition: Severe protein malnutrition-Renal/carb modified diet to continue with prostat due to low calorie/proteinmalnutrition. 11. WYO:VZCHYIF  blood pressures twice daily. Off hydralazine and furosemide at this time.   Con't clonidine and Norvasc   4/8- BP a little soft 105/60s- con't meds  4/10: BP improved, 4/11: still a little soft.   4/12- BP 130s/70s- doing better 12.T2DM with hyperglycemia: A1c 7.8-Continue Lantus daily--d/c meal coverage due to hypoglycemic episodes.Monitor blood sugars AC at bedtime for now due to hypoglycemia.   CBG (last 3)  Recent Labs    04/22/19 1639 04/22/19 2114 04/23/19 0602  GLUCAP 131* 130* 73   Lantus decreased to 15 units on 3/27 (already received a.m. dose)  Labile with hypoglycemia on 3/28.  Will need to monitor closely overnight.  Bedtime snack ordered for tonight.   3/30- DM coord asked me to increase Lantus to 18 units-   4/3: cbgs remain elevated, labile. Treating and highs and lows which causes rebound effect  4/4 Lantus 20u daily started today, increase mealtime covg to 5u  4/8- BG a little low- is NPO this AM- if drops, will need IVFs D5NS  4/9-  BGs labile due to being NPO most of yesterday- con't regimen  4/10: continue to be labile  4/11: well controlled  4/12- had low BG this AM of 69- otherwise well controlled  4/13- BGs well controlled- con't regimen  4/14- had AM BG of 38- held meal coverage as of today.  4/15- BGs doing better- had 1 of 73- will monitor  14. Leukocytosis: Resolved  3/30- WBC down to 8.9 from 13 yesterday 15. Hyperkalemia  3/30- up to 5.3- will see if renal will address since renal pt- if not, will try Lokelma.  3/31- Renal ordered Lokelma x1- wil recheck labs in AM  4/1- K+ down to 4.4- con't regimen 16. Constipation  3/31- will order Sorbitol 60 ml then follow in 3-4 hours with enema.   4/1- cleaned out- reminded pt to NOT refuse bowel meds  4/4 reiterating use/need of preventative bowel meds 17. LLQ Hematoma  4/2- will monitor hematoma- explained to try and avoid injecting into it with SQ Heparin- if not improving, will order imaging next  week.  4/5- is getting smaller- was small plum, now large grape size.   4/14- getting smaller- is now blueberry sized 18. COVID exposure  4/15- exposed to [place] 2 days ago- will get test today for nursing home placement- again- just got it and was (-) but have to do again due to exposure.   19. [name]  4/5- pt seems to think can go home, but is min assist for transfers, not Mod I or even supervision- team is trying to address.  4/6- pt emphatic that will have family at home- - won't entertain any other ideas.   4/8- going to SNF once gets a bed- and changing to HD  4/9- signed FL2- HD catheter placed- will see if needs to do here, or staying with PD while here. Will check with renal.   4/11: appreciate renal note: transitioned to HD temporarily in preparation for DC to SNF  4/12- d/c tomorrow to SNF?  4/13- d/c to SNF tomorrow? Nausea today- rec nausea meds that are ordered.   4/14- pt to go to SNF Friday if doesn't get COVID, test (+) for daughter- on droplet precautions as of today.   4/15- pt feeling OK- d/c tomorrow if all goes well  LOS: 23 days A FACE TO FACE EVALUATION WAS PERFORMED  Jaidyn Usery 04/23/2019, 9:21 AM

## 2019-04-24 LAB — CBC
HCT: 27.9 % — ABNORMAL LOW (ref 39.0–52.0)
Hemoglobin: 8.4 g/dL — ABNORMAL LOW (ref 13.0–17.0)
MCH: 28.2 pg (ref 26.0–34.0)
MCHC: 30.1 g/dL (ref 30.0–36.0)
MCV: 93.6 fL (ref 80.0–100.0)
Platelets: 244 10*3/uL (ref 150–400)
RBC: 2.98 MIL/uL — ABNORMAL LOW (ref 4.22–5.81)
RDW: 14.8 % (ref 11.5–15.5)
WBC: 9.7 10*3/uL (ref 4.0–10.5)
nRBC: 0 % (ref 0.0–0.2)

## 2019-04-24 LAB — RENAL FUNCTION PANEL
Albumin: 1.7 g/dL — ABNORMAL LOW (ref 3.5–5.0)
Anion gap: 11 (ref 5–15)
BUN: 46 mg/dL — ABNORMAL HIGH (ref 6–20)
CO2: 27 mmol/L (ref 22–32)
Calcium: 7.7 mg/dL — ABNORMAL LOW (ref 8.9–10.3)
Chloride: 97 mmol/L — ABNORMAL LOW (ref 98–111)
Creatinine, Ser: 5.64 mg/dL — ABNORMAL HIGH (ref 0.61–1.24)
GFR calc Af Amer: 13 mL/min — ABNORMAL LOW (ref >60)
GFR calc non Af Amer: 11 mL/min — ABNORMAL LOW (ref >60)
Glucose, Bld: 148 mg/dL — ABNORMAL HIGH (ref 70–99)
Phosphorus: 4.1 mg/dL (ref 2.5–4.6)
Potassium: 4.6 mmol/L (ref 3.5–5.1)
Sodium: 135 mmol/L (ref 135–145)

## 2019-04-24 LAB — SARS CORONAVIRUS 2 (TAT 6-24 HRS): SARS Coronavirus 2: NEGATIVE

## 2019-04-24 LAB — GLUCOSE, CAPILLARY
Glucose-Capillary: 196 mg/dL — ABNORMAL HIGH (ref 70–99)
Glucose-Capillary: 138 mg/dL — ABNORMAL HIGH (ref 70–99)

## 2019-04-24 MED ORDER — INSULIN GLARGINE 100 UNIT/ML ~~LOC~~ SOLN
10.0000 [IU] | Freq: Once | SUBCUTANEOUS | Status: AC
  Administered 2019-04-24: 10 [IU] via SUBCUTANEOUS
  Filled 2019-04-24: qty 0.1

## 2019-04-24 NOTE — Progress Notes (Addendum)
Social Work Patient ID: Jorge [name], male   DOB: 06-29-1970, 49 y.o.   MRN: [id number]   SW spoke with Tanya/Admissions with [facility] ([phone]/[phone]) to inform on COVID test in process and SW will follow-up once aware. SW inquired about a transportation pick up time in late afternoon once results are in. SW waiting on follow-up.   *COVID test negative. [facility] to pick up pt today after 2pm. Medical team aware. Pt aware. D/c packet prepared. D/c summary sent in Epic.    Loralee Pacas, MSW, [place] Office: [phone] Cell: [phone] Fax: [phone]

## 2019-04-24 NOTE — Progress Notes (Signed)
Renal Navigator notified OP HD clinic/[facility] of patient's discharge today after having 2 negative COVID tests. Test results faxed to clinic.  Alphonzo Cruise, [place] Renal Navigator [phone]

## 2019-04-24 NOTE — Progress Notes (Signed)
Social Work Discharge Note   The overall goal for the admission was met for:   Discharge location: Yes. D/c to SNF- [facility] Lavella Lemons Cooper/Admissions [phone])  Length of Stay: Yes. 24 days.  Discharge activity level: Yes.  Supervision to Mod I  Home/community participation: Yes. Limited.  Services provided included: MD, RD, PT, OT, RN, CM, TR, Pharmacy, Neuropsych and SW  Financial Services: Medicare  Follow-up services arranged: Pt a new dialysis: [facility] T/T/Sa seat time at 12:30pm. First date of dialysis on 4/17 arrival time of 11:30am.   Comments (or additional information): contact pt [phone]  Patient/Family verbalized understanding of follow-up arrangements: Yes  Individual responsible for coordination of the follow-up plan: Pt will be at SNF.   Confirmed correct DME delivered: Rana Snare 04/24/2019    Rana Snare

## 2019-04-24 NOTE — Progress Notes (Signed)
Report given to [facility] at [facility] [phone]. Information given regarding dialysis tomorrow at 11:30 due to first time at [facility] on [place], and  Tuesday Thursday, and Saturdays at  12:10 thereafter.

## 2019-04-25 DIAGNOSIS — T829XXA Unspecified complication of cardiac and vascular prosthetic device, implant and graft, initial encounter: Secondary | ICD-10-CM

## 2019-04-25 HISTORY — DX: Unspecified complication of cardiac and vascular prosthetic device, implant and graft, initial encounter: T82.9XXA

## 2019-04-27 ENCOUNTER — Other Ambulatory Visit: Payer: Self-pay

## 2019-04-27 ENCOUNTER — Encounter: Payer: Self-pay | Admitting: Physician Assistant

## 2019-04-27 ENCOUNTER — Ambulatory Visit (INDEPENDENT_AMBULATORY_CARE_PROVIDER_SITE_OTHER): Payer: Medicare Other | Admitting: Physician Assistant

## 2019-04-27 ENCOUNTER — Encounter: Payer: Self-pay | Admitting: Adult Health

## 2019-04-27 ENCOUNTER — Non-Acute Institutional Stay (SKILLED_NURSING_FACILITY): Payer: Medicare Other | Admitting: Adult Health

## 2019-04-27 DIAGNOSIS — Z89512 Acquired absence of left leg below knee: Secondary | ICD-10-CM

## 2019-04-27 DIAGNOSIS — I739 Peripheral vascular disease, unspecified: Secondary | ICD-10-CM | POA: Diagnosis not present

## 2019-04-27 DIAGNOSIS — D638 Anemia in other chronic diseases classified elsewhere: Secondary | ICD-10-CM | POA: Diagnosis not present

## 2019-04-27 DIAGNOSIS — Z89511 Acquired absence of right leg below knee: Secondary | ICD-10-CM

## 2019-04-27 DIAGNOSIS — I150 Renovascular hypertension: Secondary | ICD-10-CM

## 2019-04-27 DIAGNOSIS — N186 End stage renal disease: Secondary | ICD-10-CM

## 2019-04-27 DIAGNOSIS — K909 Intestinal malabsorption, unspecified: Secondary | ICD-10-CM

## 2019-04-27 DIAGNOSIS — R197 Diarrhea, unspecified: Secondary | ICD-10-CM

## 2019-04-27 DIAGNOSIS — G546 Phantom limb syndrome with pain: Secondary | ICD-10-CM

## 2019-04-27 DIAGNOSIS — E119 Type 2 diabetes mellitus without complications: Secondary | ICD-10-CM

## 2019-04-27 DIAGNOSIS — Z794 Long term (current) use of insulin: Secondary | ICD-10-CM
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
## 2019-04-27 NOTE — Progress Notes (Signed)
Occupational Therapy Discharge Summary  Patient Details  Name: Jorge [name] MRN: [id number] Date of Birth: 1970/02/02   Patient has met 7 of 8 long term goals due to improved activity tolerance, improved balance, postural control and ability to compensate for deficits.  Patient to discharge at overall Supervision level.  Patient's care partner is independent to provide the necessary physical assistance at discharge.  Daughter attended family education sessions and demonstrates ability to assist her father post discharge but unable to provide 24/7 care / supervision that he needs at this time.  Reasons goals not met:   Continues to require min A at times for toileting using drop arm commode due to ongoing generalized weakness and fall risk   Recommendation:  Patient will benefit from ongoing skilled OT services in skilled nursing facility setting to continue to advance functional skills in the area of BADL and iADL.  Equipment: No equipment provided  Reasons for discharge: treatment goals met  Patient/family agrees with progress made and goals achieved: Yes  OT Discharge Precautions/Restrictions  Precautions Precautions: Fall Precaution Comments: bliateral limb guards Restrictions Weight Bearing Restrictions: Yes RLE Weight Bearing: Non weight bearing LLE Weight Bearing: Non weight bearing ADL ADL Eating: Independent Where Assessed-Eating: Bed level, Wheelchair Grooming: Modified independent Where Assessed-Grooming: Sitting at sink, Wheelchair Upper Body Bathing: Modified independent Where Assessed-Upper Body Bathing: Sitting at sink, Wheelchair Lower Body Bathing: Setup Where Assessed-Lower Body Bathing: Bed level Upper Body Dressing: Modified independent (Device) Where Assessed-Upper Body Dressing: Wheelchair Lower Body Dressing: Setup Where Assessed-Lower Body Dressing: Bed level Toileting: Minimal assistance Where Assessed-Toileting: Bedside Commode Toilet Transfer:  Minimal assistance, Contact guard Toilet Transfer Method: Theatre manager: Drop arm bedside commode ADL Comments: Patient able to place bed pan and use urinal with set up if assistance not available for transfer to drop arm commode Vision Baseline Vision/History: Wears glasses Wears Glasses: Reading only Patient Visual Report: No change from baseline Vision Assessment?: No apparent visual deficits Perception  Perception: Within Functional Limits Praxis Praxis: Intact Cognition Overall Cognitive Status: Within Functional Limits for tasks assessed Arousal/Alertness: Awake/alert Orientation Level: Oriented X4 Problem Solving: Appears intact Safety/Judgment: Appears intact Sensation Sensation Light Touch: Appears Intact Additional Comments: UB intact Coordination Fine Motor Movements are Fluid and Coordinated: Yes Coordination and Movement Description: New B BKA with generalized weakness and decreased activity tolerance Motor  Motor Motor: Abnormal postural alignment and control Motor - Discharge Observations: general weakness Mobility  Bed Mobility Rolling Right: Independent Rolling Left: Independent Supine to Sit: Independent Sit to Supine: Independent  Trunk/Postural Assessment  Cervical Assessment Cervical Assessment: Within Functional Limits Thoracic Assessment Thoracic Assessment: Within Functional Limits Lumbar Assessment Lumbar Assessment: Within Functional Limits Postural Control Postural Control: Deficits on evaluation  Balance Static Sitting Balance Static Sitting - Level of Assistance: 7: Independent Dynamic Sitting Balance Dynamic Sitting - Level of Assistance: 5: Stand by assistance Extremity/Trunk Assessment RUE Assessment RUE Assessment: Within Functional Limits General Strength Comments: generalized weakness LUE Assessment LUE Assessment: Within Functional Limits General Strength Comments: generalized weakness   Carlos Levering 04/27/2019, 4:26 PM
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
## 2019-04-28 ENCOUNTER — Encounter: Payer: Self-pay | Admitting: Internal Medicine

## 2019-04-28 ENCOUNTER — Non-Acute Institutional Stay (SKILLED_NURSING_FACILITY): Payer: Medicare Other | Admitting: Internal Medicine

## 2019-04-28 ENCOUNTER — Emergency Department (HOSPITAL_COMMUNITY)
Admission: EM | Admit: 2019-04-28 | Discharge: 2019-04-29 | Disposition: A | Discharge status: Home / Self Care | Payer: Medicare Other | Attending: Emergency Medicine | Admitting: Emergency Medicine

## 2019-04-28 ENCOUNTER — Encounter (HOSPITAL_COMMUNITY): Payer: Self-pay

## 2019-04-28 ENCOUNTER — Other Ambulatory Visit: Payer: Self-pay

## 2019-04-28 DIAGNOSIS — Y999 Unspecified external cause status: Secondary | ICD-10-CM | POA: Diagnosis not present

## 2019-04-28 DIAGNOSIS — Z20822 Contact with and (suspected) exposure to covid-19: Secondary | ICD-10-CM | POA: Insufficient documentation

## 2019-04-28 DIAGNOSIS — Y93E8 Activity, other personal hygiene: Secondary | ICD-10-CM | POA: Insufficient documentation

## 2019-04-28 DIAGNOSIS — R197 Diarrhea, unspecified: Secondary | ICD-10-CM | POA: Insufficient documentation

## 2019-04-28 DIAGNOSIS — N186 End stage renal disease: Secondary | ICD-10-CM | POA: Diagnosis not present

## 2019-04-28 DIAGNOSIS — Z89512 Acquired absence of left leg below knee: Secondary | ICD-10-CM | POA: Diagnosis not present

## 2019-04-28 DIAGNOSIS — Z9889 Other specified postprocedural states: Secondary | ICD-10-CM | POA: Diagnosis not present

## 2019-04-28 DIAGNOSIS — E1143 Type 2 diabetes mellitus with diabetic autonomic (poly)neuropathy: Secondary | ICD-10-CM | POA: Insufficient documentation

## 2019-04-28 DIAGNOSIS — Y92531 Health care provider office as the place of occurrence of the external cause: Secondary | ICD-10-CM | POA: Insufficient documentation

## 2019-04-28 DIAGNOSIS — Z5189 Encounter for other specified aftercare: Secondary | ICD-10-CM

## 2019-04-28 DIAGNOSIS — W050XXA Fall from non-moving wheelchair, initial encounter: Secondary | ICD-10-CM | POA: Insufficient documentation

## 2019-04-28 DIAGNOSIS — K3184 Gastroparesis: Secondary | ICD-10-CM

## 2019-04-28 DIAGNOSIS — I120 Hypertensive chronic kidney disease with stage 5 chronic kidney disease or end stage renal disease: Secondary | ICD-10-CM | POA: Insufficient documentation

## 2019-04-28 DIAGNOSIS — D649 Anemia, unspecified: Secondary | ICD-10-CM | POA: Diagnosis not present

## 2019-04-28 DIAGNOSIS — T8131XA Disruption of external operation (surgical) wound, not elsewhere classified, initial encounter: Secondary | ICD-10-CM | POA: Insufficient documentation

## 2019-04-28 DIAGNOSIS — Z992 Dependence on renal dialysis: Secondary | ICD-10-CM | POA: Diagnosis not present

## 2019-04-28 DIAGNOSIS — Z89511 Acquired absence of right leg below knee: Secondary | ICD-10-CM

## 2019-04-28 MED ORDER — HYDROCODONE-ACETAMINOPHEN 5-325 MG PO TABS
2.0000 | ORAL_TABLET | Freq: Once | ORAL | Status: AC
  Administered 2019-04-28: 2 via ORAL
  Filled 2019-04-28: qty 2

## 2019-04-28 NOTE — Patient Instructions (Signed)
See assessment and plan under each diagnosis in the problem list and acutely for this visit 

## 2019-04-28 NOTE — Assessment & Plan Note (Addendum)
Second vaccine ordered

## 2019-04-28 NOTE — Progress Notes (Signed)
NURSING HOME LOCATION:  [facility] ROOM NUMBER:  [id number]  CODE STATUS:  Full Code  PCP:  No PCP  This is a comprehensive admission note to [facility] performed on this date less than 30 days from date of admission. Included are preadmission medical/surgical history; reconciled medication list; family history; social history and comprehensive review of systems.  Corrections and additions to the records were documented. Comprehensive physical exam was also performed. Additionally a clinical summary was entered for each active diagnosis pertinent to this admission in the Problem List to enhance continuity of care.  HPI: Patient was hospitalized 3/11-3/23/2021 with ulcers on both feet with gangrenous changes with associated sepsis. Imaging revealed osteomyelitis of the feet. He was started on broad-spectrum antibiotics with attempts at limb salvage.  Course was complicated by drop in his hemoglobin to 5.4 in the context of anemia of chronic disease. He did receive 2 units of PRBCs 3/12. Subsequently anemia stabilized; he remained on iron 200 mcg/week. FOBT was positive on 2 of 5 stools felt to be related to hemorrhoids as H/H was stable. There was no evidence of overt bleeding or bleeding dyscrasias otherwise. Anusol HC suppositories were added to treat the hemorrhoids. On 3/17 Dr. Sharol Given performed bilateral BKA's. Postoperatively he was NWB-BLE. Activity tolerance did improve but he had deficits in mobility and ADLs prompting discharge to [facility] on 3/23. He was in that facility 3/23-4/16/2021. Discharge actually was to be 4/13 but stay was extended because of possible exposure to Covid positive family member. Repeat Covid testing 4/16 was negative. SSI was used as needed for tighter blood sugar control. He did have intermittent issues with hypoglycemia and therefore ACE coverage was discontinued. Lantus was titrated to 20 units daily. ERSD was treated with PD but family was unable to guarantee  continuity of care at home and he was transitioned to hemodialysis. A tunneled catheter was placed by Dr. Earleen Newport of IR on 4/8. Amlodipine was titrated to allow tolerance of HD and prevent hypotension. Clonidine was discontinued on 4/14 because of systolic blood pressures in the 80-100 range. Past medical and surgical history: Includes insulin-dependent diabetes with peripheral vascular disease, essential hypertension, ESRD, and gastroparesis. Surgical history was reviewed.  Social history: Presently nondrinker. History of cocaine and marijuana abuse. Former smoker, he quit 03/30/2017.  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  He has some college.  Family history: Reviewed   Review of systems: He is alert, oriented, and articulate. His chief complaint is watery diarrhea with "chunks" in the liquid stool resembling "Gravy Train".  This has been a problem for a dozen years.  It is controlled with a half a bottle or 4 ounces of generic loperamide before breakfast.  He has no other GI symptoms despite the diagnosis of gastroparesis.  He does have oliguria.  He has a history of snoring without apnea.  He has no Covid symptoms despite the fact he was exposed to his daughter who is positive.  His test was negative.  He does have nasal stuffiness which he attributes to his bedroom window being left open last night.  He denies any cough or sputum sputum.  Constitutional: No fever, significant weight change, fatigue  Eyes: No redness, discharge, pain, vision change ENT/mouth: No purulent discharge, earache, change in hearing, sore throat  Cardiovascular: No chest pain, palpitations, paroxysmal nocturnal dyspnea, claudication, edema  Respiratory: No cough, sputum production, hemoptysis, DOE Gastrointestinal: No heartburn, dysphagia, abdominal pain, nausea /vomiting, rectal bleeding, melena  Genitourinary: No dysuria, hematuria, pyuria, incontinence, nocturia Musculoskeletal:  No joint stiffness, joint swelling, weakness, pain Dermatologic: No rash, pruritus, change in appearance of skin Neurologic: No dizziness, headache, syncope, seizures, numbness, tingling Psychiatric: No significant anxiety, depression, insomnia, anorexia Endocrine: No change in hair/skin/nails, excessive thirst, excessive hunger Hematologic/lymphatic: No significant bruising, lymphadenopathy Allergy/immunology: No itchy/watery eyes, significant sneezing, urticaria, angioedema  Physical exam:  Pertinent or positive findings: Pattern alopecia is present.  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  Dental hygiene is good.  BKA present by bilaterally.  The upper extremities are thin.  General appearance:  no acute distress, increased work of breathing is present.   Lymphatic: No lymphadenopathy about the head, neck, axilla. Eyes: No conjunctival inflammation or lid edema is present. There is no scleral icterus. Ears:  External ear exam shows no significant lesions or deformities.   Nose:  External nasal examination shows no deformity or inflammation. Nasal mucosa are pink and moist without lesions, exudates Oral exam: Lips and gums are healthy appearing.There is no oropharyngeal erythema or exudate. Neck:  No thyromegaly, masses, tenderness noted.    Heart:  Normal rate and regular rhythm. S1 and S2 normal without gallop, murmur, click, rub.  Lungs: Chest clear to auscultation without wheezes, rhonchi, rales, rubs. Abdomen: Bowel sounds are normal.  Abdomen is soft and nontender with no organomegaly, hernias, masses. GU: Deferred  Extremities:  No cyanosis, clubbing. Neurologic exam: Balance, Rhomberg, finger to nose testing could not be completed due to clinical state Skin: Warm & dry w/o tenting. No significant lesions or rash.  See clinical summary under each active problem in the Problem List with associated updated therapeutic plan

## 2019-04-28 NOTE — ED Provider Notes (Signed)
[facility] EMERGENCY DEPARTMENT Provider Note   CSN: [id number] Arrival date & time: 04/28/19  1717     History Chief Complaint  Patient presents with  . Wound Check    Jorge [name] is a 49 y.o. male.  The history is provided by the patient and medical records.  Wound Check    49 y.o. M with hx of ESRD on HD (T,Thu, Sat), anemia, HTN, gastroparesis, presenting to the ED for wound dehiscence.  States he was at dialysis center today sitting in his wheelchair that unfortunately does not lock, chair slid out from under him and he fell onto his left stump.  States he just had sutures removed yesterday and the wound has opened up.  He has had minimal bleeding.  He is not due to follow-up with his orthopedist, Dr. Sharol Given for another 2 weeks.  Denies any complications following surgery until now.  He did receive full dialysis treatment today.  History reviewed. No pertinent past medical history.  There are no problems to display for this patient.   History reviewed. No pertinent surgical history.     No family history on file.  Social History   Tobacco Use  . Smoking status: Not on file  Substance Use Topics  . Alcohol use: Not on file  . Drug use: Not on file    Home Medications Prior to Admission medications   Not on File    Allergies    Patient has no known allergies.  Review of Systems   Review of Systems  Skin: Positive for wound.  All other systems reviewed and are negative.   Physical Exam Updated Vital Signs BP (!) 187/49 (BP Location: Right Arm)   Pulse 72   Temp 97.8 F (36.6 C) (Oral)   Resp 16   SpO2 100%   Physical Exam Vitals and nursing note reviewed.  Constitutional:      Appearance: He is well-developed.  HENT:     Head: Normocephalic and atraumatic.  Eyes:     Conjunctiva/sclera: Conjunctivae normal.     Pupils: Pupils are equal, round, and reactive to light.  Cardiovascular:     Rate and Rhythm: Normal rate and  regular rhythm.     Heart sounds: Normal heart sounds.  Pulmonary:     Effort: Pulmonary effort is normal.     Breath sounds: Normal breath sounds.  Abdominal:     General: Bowel sounds are normal.     Palpations: Abdomen is soft.  Musculoskeletal:        General: Normal range of motion.     Cervical back: Normal range of motion.     Comments: Left BKA site present, there is slight wound dehiscence along inferior portion of the wound which makes somewhat of a lip, however incision remains strongly closed and adhered (unable to be pried apart with force), minimal bleeding, no obvious purulent drainage, no odor  Skin:    General: Skin is warm and dry.  Neurological:     Mental Status: He is alert and oriented to person, place, and time.        ED Results / Procedures / Treatments   Labs (all labs ordered are listed, but only abnormal results are displayed) Labs Reviewed - No data to display  EKG None  Radiology No results found.  Procedures Procedures (including critical care time)  Medications Ordered in ED Medications - No data to display  ED Course  I have reviewed the triage vital signs  and the nursing notes.  Pertinent labs & imaging results that were available during my care of the patient were reviewed by me and considered in my medical decision making (see chart for details).    MDM Rules/Calculators/A&P    49 year old male presenting to the ED after a fall from his wheelchair at dialysis today.  He has about 1 month out from left BKA with Dr. Sharol Given.  He had his sutures and staples removed yesterday in clinic.  Reports his wound opened up today after his fall, otherwise he has not had any issues.  He is afebrile and nontoxic.  His left BKA site is present, there is slight wound dehiscence along the inferior portion of the wound which makes almost like a lip.  Wound remained strongly adhered, unable to be pried apart with for direct pressure/force.  There is no  active bleeding on my exam.  There are no signs of infection such as purulent drainage or odor.  Will discuss with on-call orthopedics for Dr. Sharol Given, anticipate he will be stable for office follow-up.  10:36 PM Discussed with on call orthopedics, Dr. Lorin Mercy-- ok for wet to dry dressing and call office in the morning for follow-up with Dr. Sharol Given.  Asked to remind patient to wear his stump protector.  Plan discussed with patient, he is agreeable.  Will monitor wound for any excessive bleeding, new drainage, odor, etc.  He will call clinic in the morning for follow-up.  Return here for any new/acute changes.  Final Clinical Impression(s) / ED Diagnoses Final diagnoses:  Visit for wound check    Rx / DC Orders ED Discharge Orders    None       Larene Pickett, PA-C 04/28/19 [id number]    Carmin Muskrat, MD 05/04/19 [id number]

## 2019-04-28 NOTE — Assessment & Plan Note (Addendum)
Loperimide 4 ounces before breakfast will be initiated.  He has been asked to discuss this with his nephrologist; although, UpToDate states that such single dosing without supplemental dosing is acceptable with thrice weekly HD based on expert opinion because of protein binding.

## 2019-04-28 NOTE — Assessment & Plan Note (Signed)
Orthopedic follow-up with Dr. Sharol Given as scheduled.  Sutures were removed 4/19.

## 2019-04-28 NOTE — ED Triage Notes (Signed)
Pt from dialysis center with ems for fall, pt fell out of WC in bathroom, recent bilateral amputation 1 month ago, incision site on left stump has opened up from the fall, no bleeding noted, no signs of infection noted. Pt a.o

## 2019-04-28 NOTE — Discharge Instructions (Signed)
Keep wet to dry dressing in place.  Monitor incision site for any redness, swelling, drainage, etc. Call Dr. Jess Barters office in the morning for follow-up so they can re-check wound. Return to the ED for new or worsening symptoms.

## 2019-04-28 NOTE — Assessment & Plan Note (Signed)
Continue HD Tuesday, Thursday, Saturday

## 2019-04-29 ENCOUNTER — Encounter: Payer: Self-pay | Admitting: Adult Health

## 2019-04-29 ENCOUNTER — Non-Acute Institutional Stay (SKILLED_NURSING_FACILITY): Payer: Medicare Other | Admitting: Adult Health

## 2019-04-29 DIAGNOSIS — A0472 Enterocolitis due to Clostridium difficile, not specified as recurrent: Secondary | ICD-10-CM | POA: Diagnosis not present

## 2019-04-29 DIAGNOSIS — N186 End stage renal disease: Secondary | ICD-10-CM | POA: Diagnosis not present

## 2019-04-29 NOTE — Assessment & Plan Note (Signed)
No active bleeding dyscrasias

## 2019-04-29 NOTE — ED Notes (Signed)
[facility] unable to take pt wheelchair, facility will have to pick it up in the morning.

## 2019-04-29 NOTE — ED Notes (Signed)
RN attempted twice to call [facility] and notify them that pt is going to be discharged. No answer at this time.

## 2019-04-29 NOTE — ED Notes (Signed)
[facility] called for pt 
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
## 2019-04-30 ENCOUNTER — Encounter: Payer: Self-pay | Admitting: Adult Health

## 2019-05-01 ENCOUNTER — Encounter: Payer: Self-pay | Admitting: Family

## 2019-05-01 ENCOUNTER — Ambulatory Visit (INDEPENDENT_AMBULATORY_CARE_PROVIDER_SITE_OTHER): Payer: Medicare Other | Admitting: Family

## 2019-05-01 ENCOUNTER — Other Ambulatory Visit: Payer: Self-pay

## 2019-05-01 VITALS — Ht <= 58 in | Wt 143.4 lb

## 2019-05-01 DIAGNOSIS — Z89511 Acquired absence of right leg below knee: Secondary | ICD-10-CM

## 2019-05-01 DIAGNOSIS — W19XXXA Unspecified fall, initial encounter: Secondary | ICD-10-CM

## 2019-05-01 DIAGNOSIS — Z89512 Acquired absence of left leg below knee: Secondary | ICD-10-CM
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
## 2019-05-05 DIAGNOSIS — D689 Coagulation defect, unspecified: Secondary | ICD-10-CM | POA: Insufficient documentation

## 2019-05-05 LAB — GLUCOSE, CAPILLARY
Glucose-Capillary: 32 mg/dL — CL (ref 70–99)
Glucose-Capillary: 35 mg/dL — CL (ref 70–99)

## 2019-05-11 ENCOUNTER — Non-Acute Institutional Stay (SKILLED_NURSING_FACILITY): Payer: Medicare Other | Admitting: Adult Health

## 2019-05-11 ENCOUNTER — Ambulatory Visit (INDEPENDENT_AMBULATORY_CARE_PROVIDER_SITE_OTHER): Payer: Medicare Other | Admitting: Physician Assistant

## 2019-05-11 ENCOUNTER — Encounter: Payer: Self-pay | Admitting: Physician Assistant

## 2019-05-11 ENCOUNTER — Other Ambulatory Visit: Payer: Self-pay

## 2019-05-11 ENCOUNTER — Encounter: Payer: Self-pay | Admitting: Adult Health

## 2019-05-11 VITALS — Ht <= 58 in | Wt 143.0 lb

## 2019-05-11 DIAGNOSIS — D649 Anemia, unspecified: Secondary | ICD-10-CM | POA: Diagnosis not present

## 2019-05-11 DIAGNOSIS — K3184 Gastroparesis: Secondary | ICD-10-CM

## 2019-05-11 DIAGNOSIS — E785 Hyperlipidemia, unspecified: Secondary | ICD-10-CM

## 2019-05-11 DIAGNOSIS — A0472 Enterocolitis due to Clostridium difficile, not specified as recurrent: Secondary | ICD-10-CM

## 2019-05-11 DIAGNOSIS — E1143 Type 2 diabetes mellitus with diabetic autonomic (poly)neuropathy: Secondary | ICD-10-CM

## 2019-05-11 DIAGNOSIS — F5101 Primary insomnia: Secondary | ICD-10-CM

## 2019-05-11 DIAGNOSIS — Z89512 Acquired absence of left leg below knee: Secondary | ICD-10-CM | POA: Diagnosis not present

## 2019-05-11 DIAGNOSIS — N186 End stage renal disease: Secondary | ICD-10-CM

## 2019-05-11 DIAGNOSIS — Z794 Long term (current) use of insulin: Secondary | ICD-10-CM

## 2019-05-11 DIAGNOSIS — E1169 Type 2 diabetes mellitus with other specified complication: Secondary | ICD-10-CM

## 2019-05-11 DIAGNOSIS — Z89511 Acquired absence of right leg below knee: Secondary | ICD-10-CM

## 2019-05-11 DIAGNOSIS — G546 Phantom limb syndrome with pain: Secondary | ICD-10-CM

## 2019-05-11 DIAGNOSIS — E119 Type 2 diabetes mellitus without complications: Secondary | ICD-10-CM

## 2019-05-11 DIAGNOSIS — I150 Renovascular hypertension: Secondary | ICD-10-CM

## 2019-05-11 MED ORDER — ATORVASTATIN CALCIUM 20 MG PO TABS: 20.0000 mg | ORAL_TABLET | Freq: Every day | ORAL | 0 refills | Status: DC

## 2019-05-11 MED ORDER — PEN NEEDLES 30G X 8 MM MISC: 1.0000 | Freq: Four times a day (QID) | 0 refills | Status: DC

## 2019-05-11 MED ORDER — TRAZODONE HCL 50 MG PO TABS: 25.0000 mg | ORAL_TABLET | Freq: Every evening | ORAL | 0 refills | Status: DC | PRN

## 2019-05-11 MED ORDER — SEVELAMER CARBONATE 800 MG PO TABS: 3200.0000 mg | ORAL_TABLET | Freq: Three times a day (TID) | ORAL | 0 refills | Status: DC

## 2019-05-11 MED ORDER — INSULIN ASPART 100 UNIT/ML CARTRIDGE (PENFILL): 5.0000 [IU] | Freq: Three times a day (TID) | SUBCUTANEOUS | 0 refills | Status: DC

## 2019-05-11 MED ORDER — GABAPENTIN 300 MG PO CAPS: 300.0000 mg | ORAL_CAPSULE | Freq: Every day | ORAL | 0 refills | Status: DC

## 2019-05-11 MED ORDER — CLONIDINE HCL 0.1 MG PO TABS: 0.1000 mg | ORAL_TABLET | Freq: Every day | ORAL | 0 refills | Status: DC

## 2019-05-11 MED ORDER — LANTUS SOLOSTAR 100 UNIT/ML ~~LOC~~ SOPN: 10.0000 [IU] | PEN_INJECTOR | Freq: Every day | SUBCUTANEOUS | 0 refills | Status: DC

## 2019-05-11 MED ORDER — PROCHLORPERAZINE MALEATE 5 MG PO TABS: 5.0000 mg | ORAL_TABLET | Freq: Four times a day (QID) | ORAL | 0 refills | Status: DC | PRN

## 2019-05-11 MED ORDER — HYDROCODONE-ACETAMINOPHEN 5-325 MG PO TABS: 1.0000 | ORAL_TABLET | Freq: Two times a day (BID) | ORAL | 0 refills | Status: DC | PRN

## 2019-05-11 MED ORDER — SEVELAMER CARBONATE 800 MG PO TABS: 800.0000 mg | ORAL_TABLET | Freq: Two times a day (BID) | ORAL | 0 refills | Status: DC | PRN
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
## 2019-05-12 ENCOUNTER — Telehealth: Payer: Self-pay | Admitting: Orthopedic Surgery

## 2019-05-12 NOTE — Telephone Encounter (Signed)
Jorge [name] with [facility] called asking for the orders for PT, OT, and nursing. Jorge [name] would like a call back with this information.   Tanya# [phone]

## 2019-05-13 ENCOUNTER — Encounter: Payer: Medicare Other | Admitting: Physical Medicine and Rehabilitation

## 2019-05-13 NOTE — Telephone Encounter (Signed)
I called and sw Tanya to advise ok for PT and OT eval and treat bilateral BKA but no to [facility] as he is well healed and starting the casting process for his prosthetic.  Will call with any questions.

## 2019-05-18 DIAGNOSIS — E789 Disorder of lipoprotein metabolism, unspecified: Secondary | ICD-10-CM | POA: Insufficient documentation

## 2019-05-19 ENCOUNTER — Ambulatory Visit: Payer: Medicare Other | Admitting: Nurse Practitioner

## 2019-05-21 ENCOUNTER — Other Ambulatory Visit: Payer: Self-pay | Admitting: Nurse Practitioner

## 2019-05-21 ENCOUNTER — Telehealth: Payer: Self-pay | Admitting: Orthopedic Surgery

## 2019-05-21 NOTE — Telephone Encounter (Signed)
I called and lm on vm to advise ok for orders a s requested below pt is s/p BKA

## 2019-05-21 NOTE — Telephone Encounter (Signed)
Charisse from [facility] called requesting verbal orders. Physical therapy patient needs 1 week one and 2 week 2. Phone number is [phone].

## 2019-05-22 ENCOUNTER — Encounter: Payer: Self-pay | Admitting: Nurse Practitioner

## 2019-05-22 ENCOUNTER — Ambulatory Visit (INDEPENDENT_AMBULATORY_CARE_PROVIDER_SITE_OTHER): Payer: Medicare Other | Admitting: Nurse Practitioner

## 2019-05-22 ENCOUNTER — Other Ambulatory Visit: Payer: Self-pay

## 2019-05-22 VITALS — BP 112/78 | HR 83 | Temp 96.9°F

## 2019-05-22 DIAGNOSIS — N186 End stage renal disease: Secondary | ICD-10-CM

## 2019-05-22 DIAGNOSIS — A0472 Enterocolitis due to Clostridium difficile, not specified as recurrent: Secondary | ICD-10-CM | POA: Diagnosis not present

## 2019-05-22 DIAGNOSIS — Z89511 Acquired absence of right leg below knee: Secondary | ICD-10-CM

## 2019-05-22 DIAGNOSIS — E1143 Type 2 diabetes mellitus with diabetic autonomic (poly)neuropathy: Secondary | ICD-10-CM

## 2019-05-22 DIAGNOSIS — G546 Phantom limb syndrome with pain: Secondary | ICD-10-CM

## 2019-05-22 DIAGNOSIS — Z794 Long term (current) use of insulin: Secondary | ICD-10-CM

## 2019-05-22 DIAGNOSIS — I150 Renovascular hypertension: Secondary | ICD-10-CM

## 2019-05-22 DIAGNOSIS — D649 Anemia, unspecified: Secondary | ICD-10-CM

## 2019-05-22 DIAGNOSIS — Z89512 Acquired absence of left leg below knee: Secondary | ICD-10-CM | POA: Diagnosis not present

## 2019-05-22 DIAGNOSIS — Z992 Dependence on renal dialysis: Secondary | ICD-10-CM

## 2019-05-22 DIAGNOSIS — E1169 Type 2 diabetes mellitus with other specified complication: Secondary | ICD-10-CM

## 2019-05-22 DIAGNOSIS — E1122 Type 2 diabetes mellitus with diabetic chronic kidney disease: Secondary | ICD-10-CM

## 2019-05-22 DIAGNOSIS — K3184 Gastroparesis: Secondary | ICD-10-CM

## 2019-05-22 DIAGNOSIS — E785 Hyperlipidemia, unspecified: Secondary | ICD-10-CM

## 2019-05-22 MED ORDER — PRO-STAT SUGAR FREE PO LIQD: 30.0000 mL | Freq: Three times a day (TID) | ORAL | 0 refills | Status: DC

## 2019-05-22 MED ORDER — FREESTYLE LIBRE 14 DAY READER DEVI: 1.0000 | Freq: Every day | 12 refills | Status: DC | PRN

## 2019-05-22 MED ORDER — FREESTYLE LIBRE 14 DAY SENSOR MISC: 1.0000 | Freq: Every day | 12 refills | Status: DC | PRN
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
## 2019-05-22 NOTE — Patient Instructions (Addendum)
[facility] in [place], [place] Address: [address] [place], [place] [address] Phone: [phone]  Dr Leontine Locket [place]  [phone]     [facility] Address: [address] [place], [place], [place] [address] [place]. [place], [place] [address] [phone]  Follow up in 4 weeks for AWV- televisit  Follow up in 6 weeks for new patient follow up/ diabetes with fasting lab work prior to appt (lab work after 6/12)

## 2019-05-25 ENCOUNTER — Encounter
Payer: Medicare Other | Attending: Physical Medicine and Rehabilitation | Admitting: Physical Medicine and Rehabilitation

## 2019-05-25 ENCOUNTER — Encounter: Payer: Self-pay | Admitting: Physician Assistant

## 2019-05-25 ENCOUNTER — Other Ambulatory Visit: Payer: Self-pay

## 2019-05-25 ENCOUNTER — Ambulatory Visit (INDEPENDENT_AMBULATORY_CARE_PROVIDER_SITE_OTHER): Payer: Medicare Other | Admitting: Orthopedic Surgery

## 2019-05-25 ENCOUNTER — Encounter: Payer: Self-pay | Admitting: Physical Medicine and Rehabilitation

## 2019-05-25 VITALS — BP 155/88 | HR 80 | Temp 97.6°F | Ht <= 58 in | Wt 152.1 lb

## 2019-05-25 DIAGNOSIS — Z89511 Acquired absence of right leg below knee: Secondary | ICD-10-CM | POA: Insufficient documentation

## 2019-05-25 DIAGNOSIS — Z89512 Acquired absence of left leg below knee: Secondary | ICD-10-CM

## 2019-05-25 DIAGNOSIS — Z992 Dependence on renal dialysis: Secondary | ICD-10-CM | POA: Diagnosis present

## 2019-05-25 DIAGNOSIS — G546 Phantom limb syndrome with pain: Secondary | ICD-10-CM | POA: Insufficient documentation

## 2019-05-25 DIAGNOSIS — N186 End stage renal disease: Secondary | ICD-10-CM | POA: Insufficient documentation

## 2019-05-25 MED ORDER — GABAPENTIN 300 MG PO CAPS: 300.0000 mg | ORAL_CAPSULE | Freq: Every day | ORAL | 5 refills | Status: DC
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
## 2019-05-25 NOTE — Patient Instructions (Signed)
Patient is a 49 yr old male with ESRD on PD and B/L BKAs here for hospital f/u.    1. Using Vaseline to moisturize BKAs and cocoa butter mixture -doing 1x/day- my suggestion to 2x/day.   2. Suggest, to heal open spots- as soon as no open skin, vit E oil and also can try aloe- in a cream  3. Continue Gabapentin as he's taking it. As needed- will refill 300 m nightly as needed for nerve pain #5 RFs   4. Working with Museum/gallery curator- going to fit R BKA next week- and the L BKA after that.   5. Education regarding wearing schedule- follow it so doesn't get skin breakdown.   6. Educated on options of BKA prostheses. Ask them about walking on uneven ground.   7. F/U- 3 months-

## 2019-05-27 ENCOUNTER — Telehealth: Payer: Self-pay

## 2019-05-27 NOTE — Telephone Encounter (Signed)
Radovan, occupational therapist with [facility] called, stating that there is no need for further occupational therapy, patient is currently doing physical therapy.  CB# [phone].  Please advise.  Thank you.

## 2019-05-27 NOTE — Telephone Encounter (Signed)
Noted  

## 2019-06-05 ENCOUNTER — Telehealth: Payer: Self-pay | Admitting: *Deleted

## 2019-06-05 ENCOUNTER — Telehealth: Payer: Self-pay | Admitting: Orthopedic Surgery

## 2019-06-05 NOTE — Telephone Encounter (Signed)
Patient daughter called and stated that patient's Gastroparesis is acting up. Stated that he is Nauseated and vomiting all day today. Started today. Has taken medication with no relief. No other symptoms noted.  Please Advise.

## 2019-06-05 NOTE — Telephone Encounter (Signed)
Let's send in zofran 4mg  every 6 hrs as needed for nausea just 20 tablets.  If he's not improving, he will need urgent evaluation to prevent dehydration

## 2019-06-05 NOTE — Telephone Encounter (Signed)
Pt is s/p BKA 03/25/19 called and advised verbal ok for orders requested. Therapist did advise that the pt's appt was cancelled today due to the pt having c/o of chronic gastric issues. Therapist advised to call PCP if this persists.

## 2019-06-05 NOTE — Telephone Encounter (Signed)
Roland from [facility] called requesting verbal orders for physical therapy 1 week 1 for reassessment of patient. Roland phone number is [phone].

## 2019-06-05 NOTE — Telephone Encounter (Signed)
Stated he is taking.    6. Gastroparesis due to DM ([name]) Ongoing, continues on loperamide 4 oz prior to breakfast.

## 2019-06-05 NOTE — Telephone Encounter (Signed)
What has she taken?  Looks like she's not on anything for gastroparesis.

## 2019-06-07 ENCOUNTER — Other Ambulatory Visit: Payer: Self-pay

## 2019-06-07 ENCOUNTER — Emergency Department (HOSPITAL_COMMUNITY)
Admission: EM | Admit: 2019-06-07 | Discharge: 2019-06-07 | Disposition: A | Discharge status: Home / Self Care | Payer: Medicare Other | Attending: Emergency Medicine | Admitting: Emergency Medicine

## 2019-06-07 ENCOUNTER — Encounter (HOSPITAL_COMMUNITY): Payer: Self-pay

## 2019-06-07 DIAGNOSIS — E860 Dehydration: Secondary | ICD-10-CM | POA: Insufficient documentation

## 2019-06-07 DIAGNOSIS — K3184 Gastroparesis: Secondary | ICD-10-CM | POA: Insufficient documentation

## 2019-06-07 DIAGNOSIS — Z89511 Acquired absence of right leg below knee: Secondary | ICD-10-CM | POA: Insufficient documentation

## 2019-06-07 DIAGNOSIS — R112 Nausea with vomiting, unspecified: Secondary | ICD-10-CM

## 2019-06-07 DIAGNOSIS — Z992 Dependence on renal dialysis: Secondary | ICD-10-CM | POA: Diagnosis not present

## 2019-06-07 DIAGNOSIS — Z79899 Other long term (current) drug therapy: Secondary | ICD-10-CM | POA: Diagnosis not present

## 2019-06-07 DIAGNOSIS — R110 Nausea: Secondary | ICD-10-CM

## 2019-06-07 DIAGNOSIS — E1122 Type 2 diabetes mellitus with diabetic chronic kidney disease: Secondary | ICD-10-CM | POA: Insufficient documentation

## 2019-06-07 DIAGNOSIS — Z89512 Acquired absence of left leg below knee: Secondary | ICD-10-CM | POA: Diagnosis not present

## 2019-06-07 DIAGNOSIS — I120 Hypertensive chronic kidney disease with stage 5 chronic kidney disease or end stage renal disease: Secondary | ICD-10-CM | POA: Insufficient documentation

## 2019-06-07 DIAGNOSIS — E1143 Type 2 diabetes mellitus with diabetic autonomic (poly)neuropathy: Secondary | ICD-10-CM | POA: Diagnosis not present

## 2019-06-07 DIAGNOSIS — N186 End stage renal disease: Secondary | ICD-10-CM | POA: Diagnosis not present

## 2019-06-07 DIAGNOSIS — Z794 Long term (current) use of insulin: Secondary | ICD-10-CM | POA: Diagnosis not present

## 2019-06-07 LAB — URINALYSIS, ROUTINE W REFLEX MICROSCOPIC
Bacteria, UA: NONE SEEN
Bilirubin Urine: NEGATIVE
Glucose, UA: >=500 mg/dL — AB
Ketones, ur: NEGATIVE mg/dL
Leukocytes,Ua: NEGATIVE
Nitrite: NEGATIVE
Protein, ur: 100 mg/dL — AB
Specific Gravity, Urine: 1.012 (ref 1.005–1.030)
pH: 7 (ref 5.0–8.0)

## 2019-06-07 LAB — CBC
HCT: 40.4 % (ref 39.0–52.0)
Hemoglobin: 12.3 g/dL — ABNORMAL LOW (ref 13.0–17.0)
MCH: 26.9 pg (ref 26.0–34.0)
MCHC: 30.4 g/dL (ref 30.0–36.0)
MCV: 88.2 fL (ref 80.0–100.0)
Platelets: 155 10*3/uL (ref 150–400)
RBC: 4.58 MIL/uL (ref 4.22–5.81)
RDW: 19.5 % — ABNORMAL HIGH (ref 11.5–15.5)
WBC: 6.1 10*3/uL (ref 4.0–10.5)
nRBC: 0 % (ref 0.0–0.2)

## 2019-06-07 LAB — COMPREHENSIVE METABOLIC PANEL
ALT: 13 U/L (ref 0–44)
AST: 16 U/L (ref 15–41)
Albumin: 2.7 g/dL — ABNORMAL LOW (ref 3.5–5.0)
Alkaline Phosphatase: 98 U/L (ref 38–126)
Anion gap: 21 — ABNORMAL HIGH (ref 5–15)
BUN: 100 mg/dL — ABNORMAL HIGH (ref 6–20)
CO2: 18 mmol/L — ABNORMAL LOW (ref 22–32)
Calcium: 9 mg/dL (ref 8.9–10.3)
Chloride: 99 mmol/L (ref 98–111)
Creatinine, Ser: 14.01 mg/dL — ABNORMAL HIGH (ref 0.61–1.24)
GFR calc Af Amer: 4 mL/min — ABNORMAL LOW (ref >60)
GFR calc non Af Amer: 4 mL/min — ABNORMAL LOW (ref >60)
Glucose, Bld: 292 mg/dL — ABNORMAL HIGH (ref 70–99)
Potassium: 4.3 mmol/L (ref 3.5–5.1)
Sodium: 138 mmol/L (ref 135–145)
Total Bilirubin: 0.4 mg/dL (ref 0.3–1.2)
Total Protein: 7.6 g/dL (ref 6.5–8.1)

## 2019-06-07 LAB — LIPASE, BLOOD: Lipase: 19 U/L (ref 11–51)

## 2019-06-07 MED ORDER — METOCLOPRAMIDE HCL 10 MG PO TABS
10.0000 mg | ORAL_TABLET | Freq: Four times a day (QID) | ORAL | 0 refills | Status: DC
Start: 2019-06-07 — End: 2019-08-28

## 2019-06-07 MED ORDER — ONDANSETRON 4 MG PO TBDP
4.0000 mg | ORAL_TABLET | Freq: Three times a day (TID) | ORAL | 0 refills | Status: DC | PRN
Start: 2019-06-07 — End: 2020-05-09

## 2019-06-07 MED ORDER — SODIUM CHLORIDE 0.9 % IV BOLUS
500.0000 mL | Freq: Once | INTRAVENOUS | Status: AC
  Administered 2019-06-07: 500 mL via INTRAVENOUS

## 2019-06-07 MED ORDER — ONDANSETRON 4 MG PO TBDP: 4.0000 mg | ORAL_TABLET | Freq: Once | ORAL | Status: DC

## 2019-06-07 MED ORDER — AMLODIPINE BESYLATE 5 MG PO TABS
10.0000 mg | ORAL_TABLET | Freq: Once | ORAL | Status: AC
  Administered 2019-06-07: 10 mg via ORAL
  Filled 2019-06-07: qty 2

## 2019-06-07 MED ORDER — METOCLOPRAMIDE HCL 5 MG/ML IJ SOLN
10.0000 mg | Freq: Once | INTRAMUSCULAR | Status: AC
  Administered 2019-06-07: 10 mg via INTRAVENOUS
  Filled 2019-06-07: qty 2

## 2019-06-07 MED ORDER — SODIUM CHLORIDE 0.9% FLUSH: 3.0000 mL | Freq: Once | INTRAVENOUS | Status: DC

## 2019-06-07 MED ORDER — ONDANSETRON 4 MG PO TBDP: 8.0000 mg | ORAL_TABLET | Freq: Once | ORAL | Status: DC

## 2019-06-07 MED ORDER — ONDANSETRON 4 MG PO TBDP
4.0000 mg | ORAL_TABLET | Freq: Once | ORAL | Status: AC
  Administered 2019-06-07: 4 mg via ORAL
  Filled 2019-06-07: qty 1

## 2019-06-07 MED ORDER — DIPHENHYDRAMINE HCL 50 MG/ML IJ SOLN
25.0000 mg | Freq: Once | INTRAMUSCULAR | Status: AC
  Administered 2019-06-07: 25 mg via INTRAVENOUS
  Filled 2019-06-07: qty 1

## 2019-06-07 NOTE — ED Notes (Signed)
Patient reports nausea has returned. Zofran given per Dr Wilson Singer

## 2019-06-07 NOTE — ED Triage Notes (Signed)
Patient complains of abdominal pain with vomiting that started yesterday morning, patient states that he thinks related to gastroparesis. Alert and oriented, NAD

## 2019-06-07 NOTE — ED Notes (Signed)
Provided pt with a cup of water for fluid challenge
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
## 2019-06-07 NOTE — ED Notes (Signed)
Culture sent with urine sample

## 2019-06-07 NOTE — Discharge Instructions (Addendum)
Please drink plenty of fluid, please take Reglan for nausea.  If you are actively vomiting you may use Zofran but please do not use Reglan and Zofran together.  Please follow-up with your primary care doctor.  Your nephrologist and also please see gastroenterology for your likely gastroparesis.

## 2019-06-09 ENCOUNTER — Encounter: Payer: Medicare Other | Admitting: Physical Therapy

## 2019-06-09 DIAGNOSIS — D631 Anemia in chronic kidney disease: Secondary | ICD-10-CM | POA: Diagnosis not present

## 2019-06-09 DIAGNOSIS — Z992 Dependence on renal dialysis: Secondary | ICD-10-CM | POA: Diagnosis not present

## 2019-06-09 DIAGNOSIS — N186 End stage renal disease: Secondary | ICD-10-CM | POA: Diagnosis not present

## 2019-06-09 DIAGNOSIS — Z79899 Other long term (current) drug therapy: Secondary | ICD-10-CM | POA: Diagnosis not present

## 2019-06-09 DIAGNOSIS — N2581 Secondary hyperparathyroidism of renal origin: Secondary | ICD-10-CM | POA: Diagnosis not present

## 2019-06-09 DIAGNOSIS — D509 Iron deficiency anemia, unspecified: Secondary | ICD-10-CM | POA: Diagnosis not present

## 2019-06-09 NOTE — Telephone Encounter (Signed)
LMOM to return call.

## 2019-06-09 NOTE — Telephone Encounter (Signed)
LMOM to return call. (Message was forwarded to [facility] on 5/28)

## 2019-06-10 DIAGNOSIS — Z992 Dependence on renal dialysis: Secondary | ICD-10-CM | POA: Diagnosis not present

## 2019-06-10 DIAGNOSIS — N2581 Secondary hyperparathyroidism of renal origin: Secondary | ICD-10-CM | POA: Diagnosis not present

## 2019-06-10 DIAGNOSIS — D509 Iron deficiency anemia, unspecified: Secondary | ICD-10-CM | POA: Diagnosis not present

## 2019-06-10 DIAGNOSIS — N186 End stage renal disease: Secondary | ICD-10-CM | POA: Diagnosis not present

## 2019-06-10 DIAGNOSIS — Z79899 Other long term (current) drug therapy: Secondary | ICD-10-CM | POA: Diagnosis not present

## 2019-06-10 DIAGNOSIS — D631 Anemia in chronic kidney disease: Secondary | ICD-10-CM | POA: Diagnosis not present

## 2019-06-10 NOTE — Telephone Encounter (Signed)
LMOM to return call.

## 2019-06-11 DIAGNOSIS — Z992 Dependence on renal dialysis: Secondary | ICD-10-CM | POA: Diagnosis not present

## 2019-06-11 DIAGNOSIS — D509 Iron deficiency anemia, unspecified: Secondary | ICD-10-CM | POA: Diagnosis not present

## 2019-06-11 DIAGNOSIS — D631 Anemia in chronic kidney disease: Secondary | ICD-10-CM | POA: Diagnosis not present

## 2019-06-11 DIAGNOSIS — N186 End stage renal disease: Secondary | ICD-10-CM | POA: Diagnosis not present

## 2019-06-11 DIAGNOSIS — Z79899 Other long term (current) drug therapy: Secondary | ICD-10-CM | POA: Diagnosis not present

## 2019-06-11 DIAGNOSIS — N2581 Secondary hyperparathyroidism of renal origin: Secondary | ICD-10-CM | POA: Diagnosis not present

## 2019-06-13 DIAGNOSIS — N186 End stage renal disease: Secondary | ICD-10-CM | POA: Diagnosis not present

## 2019-06-13 DIAGNOSIS — Z79899 Other long term (current) drug therapy: Secondary | ICD-10-CM | POA: Diagnosis not present

## 2019-06-13 DIAGNOSIS — Z992 Dependence on renal dialysis: Secondary | ICD-10-CM | POA: Diagnosis not present

## 2019-06-13 DIAGNOSIS — D631 Anemia in chronic kidney disease: Secondary | ICD-10-CM | POA: Diagnosis not present

## 2019-06-13 DIAGNOSIS — N2581 Secondary hyperparathyroidism of renal origin: Secondary | ICD-10-CM | POA: Diagnosis not present

## 2019-06-13 DIAGNOSIS — D509 Iron deficiency anemia, unspecified: Secondary | ICD-10-CM | POA: Diagnosis not present

## 2019-06-14 DIAGNOSIS — Z89511 Acquired absence of right leg below knee: Secondary | ICD-10-CM | POA: Diagnosis not present

## 2019-06-14 DIAGNOSIS — Z89512 Acquired absence of left leg below knee: Secondary | ICD-10-CM | POA: Diagnosis not present

## 2019-06-14 DIAGNOSIS — I120 Hypertensive chronic kidney disease with stage 5 chronic kidney disease or end stage renal disease: Secondary | ICD-10-CM | POA: Diagnosis not present

## 2019-06-16 DIAGNOSIS — D631 Anemia in chronic kidney disease: Secondary | ICD-10-CM | POA: Diagnosis not present

## 2019-06-16 DIAGNOSIS — Z79899 Other long term (current) drug therapy: Secondary | ICD-10-CM | POA: Diagnosis not present

## 2019-06-16 DIAGNOSIS — N2581 Secondary hyperparathyroidism of renal origin: Secondary | ICD-10-CM | POA: Diagnosis not present

## 2019-06-16 DIAGNOSIS — Z992 Dependence on renal dialysis: Secondary | ICD-10-CM | POA: Diagnosis not present

## 2019-06-16 DIAGNOSIS — N186 End stage renal disease: Secondary | ICD-10-CM | POA: Diagnosis not present

## 2019-06-16 DIAGNOSIS — D509 Iron deficiency anemia, unspecified: Secondary | ICD-10-CM | POA: Diagnosis not present

## 2019-06-18 DIAGNOSIS — D631 Anemia in chronic kidney disease: Secondary | ICD-10-CM | POA: Diagnosis not present

## 2019-06-18 DIAGNOSIS — N2581 Secondary hyperparathyroidism of renal origin: Secondary | ICD-10-CM | POA: Diagnosis not present

## 2019-06-18 DIAGNOSIS — D509 Iron deficiency anemia, unspecified: Secondary | ICD-10-CM | POA: Diagnosis not present

## 2019-06-18 DIAGNOSIS — Z992 Dependence on renal dialysis: Secondary | ICD-10-CM | POA: Diagnosis not present

## 2019-06-18 DIAGNOSIS — N186 End stage renal disease: Secondary | ICD-10-CM | POA: Diagnosis not present

## 2019-06-18 DIAGNOSIS — Z79899 Other long term (current) drug therapy: Secondary | ICD-10-CM | POA: Diagnosis not present

## 2019-06-19 ENCOUNTER — Telehealth: Payer: Self-pay

## 2019-06-19 ENCOUNTER — Ambulatory Visit (INDEPENDENT_AMBULATORY_CARE_PROVIDER_SITE_OTHER): Payer: Medicare Other | Admitting: Nurse Practitioner

## 2019-06-19 ENCOUNTER — Encounter: Payer: Self-pay | Admitting: Nurse Practitioner

## 2019-06-19 ENCOUNTER — Other Ambulatory Visit: Payer: Self-pay

## 2019-06-19 DIAGNOSIS — Z0000 Encounter for general adult medical examination without abnormal findings: Secondary | ICD-10-CM

## 2019-06-19 DIAGNOSIS — Z1159 Encounter for screening for other viral diseases: Secondary | ICD-10-CM | POA: Diagnosis not present

## 2019-06-19 NOTE — Progress Notes (Signed)
   This service is provided via telemedicine  No vital signs collected/recorded due to the encounter was a telemedicine visit.   Location of patient (ex: home, work):  Home  Patient consents to a telephone visit:  Yes, see telephone encounter dated 06/19/19 with annual consent   Location of the provider (ex: office, home):  [facility], Office   Name of any referring provider:  N/A  Names of all persons participating in the telemedicine service and their role in the encounter: S.Chrae B/CMA, Sherrie Mustache, NP, and Patient   Time spent on call:  7 min with medical assistant

## 2019-06-19 NOTE — Telephone Encounter (Signed)
[name], Jorge [name] are scheduled for a virtual visit with your provider today.    Just as we do with appointments in the office, we must obtain your consent to participate.  Your consent will be active for this visit and any virtual visit you may have with one of our providers in the next 365 days.    If you have a MyChart account, I can also send a copy of this consent to you electronically.  All virtual visits are billed to your insurance company just like a traditional visit in the office.  As this is a virtual visit, video technology does not allow for your provider to perform a traditional examination.  This may limit your provider's ability to fully assess your condition.  If your provider identifies any concerns that need to be evaluated in person or the need to arrange testing such as labs, EKG, etc, we will make arrangements to do so.    Although advances in technology are sophisticated, we cannot ensure that it will always work on either your end or our end.  If the connection with a video visit is poor, we may have to switch to a telephone visit.  With either a video or telephone visit, we are not always able to ensure that we have a secure connection.   I need to obtain your verbal consent now.   Are you willing to proceed with your visit today?   Jorge [name] has provided verbal consent on 06/19/2019 for a virtual visit (video or telephone).   [name] [place], [place] 06/19/2019  1:02 pm

## 2019-06-19 NOTE — Patient Instructions (Signed)
[name] , Thank you for taking time to come for your Medicare Wellness Visit. I appreciate your ongoing commitment to your health goals. Please review the following plan we discussed and let me know if I can assist you in the future.   Screening recommendations/referrals: Colonoscopy- last in 2019, follow up in 7 years Recommended yearly ophthalmology/optometry visit for glaucoma screening and checkup-- MAKE APPT FOR DIABETIC EYE EXAM Recommended yearly dental visit for hygiene and checkup   Vaccinations: Influenza vaccine DUE in September 2021 Pneumococcal vaccine up to date Tdap vaccine RECOMMENDED to get at your local pharmacy Shingles vaccine RECOMMENDED, to get at your local pharmacy  Advanced directives: to complete and bring to office to place on file.   Conditions/risks identified: complications related to diabetes  Next appointment: 1 year  Preventive Care 40-64 Years, Male Preventive care refers to lifestyle choices and visits with your health care provider that can promote health and wellness. What does preventive care include?  A yearly physical exam. This is also called an annual well check.  Dental exams once or twice a year.  Routine eye exams. Ask your health care provider how often you should have your eyes checked.  Personal lifestyle choices, including:  Daily care of your teeth and gums.  Regular physical activity.  Eating a healthy diet.  Avoiding tobacco and drug use.  Limiting alcohol use.  Practicing safe sex.  Taking low-dose aspirin every day starting at age 50. What happens during an annual well check? The services and screenings done by your health care provider during your annual well check will depend on your age, overall health, lifestyle risk factors, and family history of disease. Counseling  Your health care provider may ask you questions about your:  Alcohol use.  Tobacco use.  Drug use.  Emotional well-being.  Home and  relationship well-being.  Sexual activity.  Eating habits.  Work and work Statistician. Screening  You may have the following tests or measurements:  Height, weight, and BMI.  Blood pressure.  Lipid and cholesterol levels. These may be checked every 5 years, or more frequently if you are over 82 years old.  Skin check.  Lung cancer screening. You may have this screening every year starting at age 93 if you have a 30-pack-year history of smoking and currently smoke or have quit within the past 15 years.  Fecal occult blood test (FOBT) of the stool. You may have this test every year starting at age 33.  Flexible sigmoidoscopy or colonoscopy. You may have a sigmoidoscopy every 5 years or a colonoscopy every 10 years starting at age 64.  Prostate cancer screening. Recommendations will vary depending on your family history and other risks.  Hepatitis C blood test.  Hepatitis B blood test.  Sexually transmitted disease (STD) testing.  Diabetes screening. This is done by checking your blood sugar (glucose) after you have not eaten for a while (fasting). You may have this done every 1-3 years. Discuss your test results, treatment options, and if necessary, the need for more tests with your health care provider. Vaccines  Your health care provider may recommend certain vaccines, such as:  Influenza vaccine. This is recommended every year.  Tetanus, diphtheria, and acellular pertussis (Tdap, Td) vaccine. You may need a Td booster every 10 years.  Zoster vaccine. You may need this after age 36.  Pneumococcal 13-valent conjugate (PCV13) vaccine. You may need this if you have certain conditions and have not been vaccinated.  Pneumococcal polysaccharide (PPSV23)  vaccine. You may need one or two doses if you smoke cigarettes or if you have certain conditions. Talk to your health care provider about which screenings and vaccines you need and how often you need them. This information is  not intended to replace advice given to you by your health care provider. Make sure you discuss any questions you have with your health care provider. Document Released: 01/21/2015 Document Revised: 09/14/2015 Document Reviewed: 10/26/2014 Elsevier Interactive Patient Education  2017 [place] Prevention in the Home Falls can cause injuries. They can happen to people of all ages. There are many things you can do to make your home safe and to help prevent falls. What can I do on the outside of my home?  Regularly fix the edges of walkways and driveways and fix any cracks.  Remove anything that might make you trip as you walk through a door, such as a raised step or threshold.  Trim any bushes or trees on the path to your home.  Use bright outdoor lighting.  Clear any walking paths of anything that might make someone trip, such as rocks or tools.  Regularly check to see if handrails are loose or broken. Make sure that both sides of any steps have handrails.  Any raised decks and porches should have guardrails on the edges.  Have any leaves, snow, or ice cleared regularly.  Use sand or salt on walking paths during winter.  Clean up any spills in your garage right away. This includes oil or grease spills. What can I do in the bathroom?  Use night lights.  Install grab bars by the toilet and in the tub and shower. Do not use towel bars as grab bars.  Use non-skid mats or decals in the tub or shower.  If you need to sit down in the shower, use a plastic, non-slip stool.  Keep the floor dry. Clean up any water that spills on the floor as soon as it happens.  Remove soap buildup in the tub or shower regularly.  Attach bath mats securely with double-sided non-slip rug tape.  Do not have throw rugs and other things on the floor that can make you trip. What can I do in the bedroom?  Use night lights.  Make sure that you have a light by your bed that is easy to  reach.  Do not use any sheets or blankets that are too big for your bed. They should not hang down onto the floor.  Have a firm chair that has side arms. You can use this for support while you get dressed.  Do not have throw rugs and other things on the floor that can make you trip. What can I do in the kitchen?  Clean up any spills right away.  Avoid walking on wet floors.  Keep items that you use a lot in easy-to-reach places.  If you need to reach something above you, use a strong step stool that has a grab bar.  Keep electrical cords out of the way.  Do not use floor polish or wax that makes floors slippery. If you must use wax, use non-skid floor wax.  Do not have throw rugs and other things on the floor that can make you trip. What can I do with my stairs?  Do not leave any items on the stairs.  Make sure that there are handrails on both sides of the stairs and use them. Fix handrails that are broken or  loose. Make sure that handrails are as long as the stairways.  Check any carpeting to make sure that it is firmly attached to the stairs. Fix any carpet that is loose or worn.  Avoid having throw rugs at the top or bottom of the stairs. If you do have throw rugs, attach them to the floor with carpet tape.  Make sure that you have a light switch at the top of the stairs and the bottom of the stairs. If you do not have them, ask someone to add them for you. What else can I do to help prevent falls?  Wear shoes that:  Do not have high heels.  Have rubber bottoms.  Are comfortable and fit you well.  Are closed at the toe. Do not wear sandals.  If you use a stepladder:  Make sure that it is fully opened. Do not climb a closed stepladder.  Make sure that both sides of the stepladder are locked into place.  Ask someone to hold it for you, if possible.  Clearly mark and make sure that you can see:  Any grab bars or handrails.  First and last steps.  Where the  edge of each step is.  Use tools that help you move around (mobility aids) if they are needed. These include:  Canes.  Walkers.  Scooters.  Crutches.  Turn on the lights when you go into a dark area. Replace any light bulbs as soon as they burn out.  Set up your furniture so you have a clear path. Avoid moving your furniture around.  If any of your floors are uneven, fix them.  If there are any pets around you, be aware of where they are.  Review your medicines with your doctor. Some medicines can make you feel dizzy. This can increase your chance of falling. Ask your doctor what other things that you can do to help prevent falls. This information is not intended to replace advice given to you by your health care provider. Make sure you discuss any questions you have with your health care provider. Document Released: 10/21/2008 Document Revised: 06/02/2015 Document Reviewed: 01/29/2014 Elsevier Interactive Patient Education  2017 [employer].
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
## 2019-06-20 DIAGNOSIS — Z992 Dependence on renal dialysis: Secondary | ICD-10-CM | POA: Diagnosis not present

## 2019-06-20 DIAGNOSIS — D631 Anemia in chronic kidney disease: Secondary | ICD-10-CM | POA: Diagnosis not present

## 2019-06-20 DIAGNOSIS — Z79899 Other long term (current) drug therapy: Secondary | ICD-10-CM | POA: Diagnosis not present

## 2019-06-20 DIAGNOSIS — N2581 Secondary hyperparathyroidism of renal origin: Secondary | ICD-10-CM | POA: Diagnosis not present

## 2019-06-20 DIAGNOSIS — D509 Iron deficiency anemia, unspecified: Secondary | ICD-10-CM | POA: Diagnosis not present

## 2019-06-20 DIAGNOSIS — N186 End stage renal disease: Secondary | ICD-10-CM | POA: Diagnosis not present

## 2019-06-22 ENCOUNTER — Ambulatory Visit (INDEPENDENT_AMBULATORY_CARE_PROVIDER_SITE_OTHER): Payer: Medicare Other | Admitting: Physician Assistant

## 2019-06-22 ENCOUNTER — Other Ambulatory Visit: Payer: Self-pay

## 2019-06-22 ENCOUNTER — Other Ambulatory Visit: Payer: Medicare Other

## 2019-06-22 ENCOUNTER — Encounter: Payer: Self-pay | Admitting: Orthopedic Surgery

## 2019-06-22 VITALS — Ht <= 58 in | Wt 152.1 lb

## 2019-06-22 DIAGNOSIS — E785 Hyperlipidemia, unspecified: Secondary | ICD-10-CM | POA: Diagnosis not present

## 2019-06-22 DIAGNOSIS — E1122 Type 2 diabetes mellitus with diabetic chronic kidney disease: Secondary | ICD-10-CM | POA: Diagnosis not present

## 2019-06-22 DIAGNOSIS — Z794 Long term (current) use of insulin: Secondary | ICD-10-CM | POA: Diagnosis not present

## 2019-06-22 DIAGNOSIS — E1169 Type 2 diabetes mellitus with other specified complication: Secondary | ICD-10-CM | POA: Diagnosis not present

## 2019-06-22 DIAGNOSIS — N186 End stage renal disease: Secondary | ICD-10-CM | POA: Diagnosis not present

## 2019-06-22 DIAGNOSIS — Z89512 Acquired absence of left leg below knee: Secondary | ICD-10-CM

## 2019-06-22 DIAGNOSIS — Z89511 Acquired absence of right leg below knee: Secondary | ICD-10-CM

## 2019-06-22 DIAGNOSIS — Z1159 Encounter for screening for other viral diseases: Secondary | ICD-10-CM
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
## 2019-06-23 DIAGNOSIS — N2581 Secondary hyperparathyroidism of renal origin: Secondary | ICD-10-CM | POA: Diagnosis not present

## 2019-06-23 DIAGNOSIS — N186 End stage renal disease: Secondary | ICD-10-CM | POA: Diagnosis not present

## 2019-06-23 DIAGNOSIS — Z79899 Other long term (current) drug therapy: Secondary | ICD-10-CM | POA: Diagnosis not present

## 2019-06-23 DIAGNOSIS — Z992 Dependence on renal dialysis: Secondary | ICD-10-CM | POA: Diagnosis not present

## 2019-06-23 DIAGNOSIS — D509 Iron deficiency anemia, unspecified: Secondary | ICD-10-CM | POA: Diagnosis not present

## 2019-06-23 DIAGNOSIS — D631 Anemia in chronic kidney disease: Secondary | ICD-10-CM | POA: Diagnosis not present

## 2019-06-23 LAB — HEMOGLOBIN A1C
Hgb A1c MFr Bld: 6.3 % of total Hgb — ABNORMAL HIGH (ref <5.7)
Mean Plasma Glucose: 134 (calc)
eAG (mmol/L): 7.4 (calc)

## 2019-06-23 LAB — LIPID PANEL
Cholesterol: 144 mg/dL (ref <200)
HDL: 58 mg/dL (ref >=40)
LDL Cholesterol (Calc): 70 mg/dL (calc)
Non-HDL Cholesterol (Calc): 86 mg/dL (calc) (ref <130)
Total CHOL/HDL Ratio: 2.5 (calc) (ref <5.0)
Triglycerides: 82 mg/dL (ref <150)

## 2019-06-23 LAB — HEPATIC FUNCTION PANEL
AG Ratio: 0.9 (calc) — ABNORMAL LOW (ref 1.0–2.5)
ALT: 10 U/L (ref 9–46)
AST: 13 U/L (ref 10–35)
Albumin: 3.1 g/dL — ABNORMAL LOW (ref 3.6–5.1)
Alkaline phosphatase (APISO): 122 U/L (ref 35–144)
Bilirubin, Direct: 0.1 mg/dL (ref 0.0–0.2)
Globulin: 3.6 g/dL (calc) (ref 1.9–3.7)
Indirect Bilirubin: 0.3 mg/dL (calc) (ref 0.2–1.2)
Total Bilirubin: 0.4 mg/dL (ref 0.2–1.2)
Total Protein: 6.7 g/dL (ref 6.1–8.1)

## 2019-06-23 LAB — HEPATITIS C ANTIBODY
Hepatitis C Ab: NONREACTIVE
SIGNAL TO CUT-OFF: 0.27 (ref <1.00)

## 2019-06-24 DIAGNOSIS — N186 End stage renal disease: Secondary | ICD-10-CM | POA: Diagnosis not present

## 2019-06-24 DIAGNOSIS — D509 Iron deficiency anemia, unspecified: Secondary | ICD-10-CM | POA: Diagnosis not present

## 2019-06-24 DIAGNOSIS — D631 Anemia in chronic kidney disease: Secondary | ICD-10-CM | POA: Diagnosis not present

## 2019-06-24 DIAGNOSIS — Z992 Dependence on renal dialysis: Secondary | ICD-10-CM | POA: Diagnosis not present

## 2019-06-24 DIAGNOSIS — N2581 Secondary hyperparathyroidism of renal origin: Secondary | ICD-10-CM | POA: Diagnosis not present

## 2019-06-24 DIAGNOSIS — Z79899 Other long term (current) drug therapy: Secondary | ICD-10-CM | POA: Diagnosis not present

## 2019-06-25 DIAGNOSIS — N2581 Secondary hyperparathyroidism of renal origin: Secondary | ICD-10-CM | POA: Diagnosis not present

## 2019-06-25 DIAGNOSIS — N186 End stage renal disease: Secondary | ICD-10-CM | POA: Diagnosis not present

## 2019-06-25 DIAGNOSIS — D509 Iron deficiency anemia, unspecified: Secondary | ICD-10-CM | POA: Diagnosis not present

## 2019-06-25 DIAGNOSIS — D631 Anemia in chronic kidney disease: Secondary | ICD-10-CM | POA: Diagnosis not present

## 2019-06-25 DIAGNOSIS — Z992 Dependence on renal dialysis: Secondary | ICD-10-CM | POA: Diagnosis not present

## 2019-06-25 DIAGNOSIS — Z79899 Other long term (current) drug therapy: Secondary | ICD-10-CM | POA: Diagnosis not present

## 2019-06-26 ENCOUNTER — Ambulatory Visit: Payer: Medicare Other | Admitting: Nurse Practitioner

## 2019-06-27 DIAGNOSIS — D509 Iron deficiency anemia, unspecified: Secondary | ICD-10-CM | POA: Diagnosis not present

## 2019-06-27 DIAGNOSIS — D631 Anemia in chronic kidney disease: Secondary | ICD-10-CM | POA: Diagnosis not present

## 2019-06-27 DIAGNOSIS — N186 End stage renal disease: Secondary | ICD-10-CM | POA: Diagnosis not present

## 2019-06-27 DIAGNOSIS — Z79899 Other long term (current) drug therapy: Secondary | ICD-10-CM | POA: Diagnosis not present

## 2019-06-27 DIAGNOSIS — N2581 Secondary hyperparathyroidism of renal origin: Secondary | ICD-10-CM | POA: Diagnosis not present

## 2019-06-27 DIAGNOSIS — Z992 Dependence on renal dialysis: Secondary | ICD-10-CM | POA: Diagnosis not present

## 2019-06-30 DIAGNOSIS — D509 Iron deficiency anemia, unspecified: Secondary | ICD-10-CM | POA: Diagnosis not present

## 2019-06-30 DIAGNOSIS — Z992 Dependence on renal dialysis: Secondary | ICD-10-CM | POA: Diagnosis not present

## 2019-06-30 DIAGNOSIS — D631 Anemia in chronic kidney disease: Secondary | ICD-10-CM | POA: Diagnosis not present

## 2019-06-30 DIAGNOSIS — Z79899 Other long term (current) drug therapy: Secondary | ICD-10-CM | POA: Diagnosis not present

## 2019-06-30 DIAGNOSIS — N2581 Secondary hyperparathyroidism of renal origin: Secondary | ICD-10-CM | POA: Diagnosis not present

## 2019-06-30 DIAGNOSIS — N186 End stage renal disease: Secondary | ICD-10-CM | POA: Diagnosis not present

## 2019-07-02 DIAGNOSIS — Z79899 Other long term (current) drug therapy: Secondary | ICD-10-CM | POA: Diagnosis not present

## 2019-07-02 DIAGNOSIS — D509 Iron deficiency anemia, unspecified: Secondary | ICD-10-CM | POA: Diagnosis not present

## 2019-07-02 DIAGNOSIS — Z992 Dependence on renal dialysis: Secondary | ICD-10-CM | POA: Diagnosis not present

## 2019-07-02 DIAGNOSIS — N2581 Secondary hyperparathyroidism of renal origin: Secondary | ICD-10-CM | POA: Diagnosis not present

## 2019-07-02 DIAGNOSIS — D631 Anemia in chronic kidney disease: Secondary | ICD-10-CM | POA: Diagnosis not present

## 2019-07-02 DIAGNOSIS — N186 End stage renal disease: Secondary | ICD-10-CM | POA: Diagnosis not present

## 2019-07-04 DIAGNOSIS — Z992 Dependence on renal dialysis: Secondary | ICD-10-CM | POA: Diagnosis not present

## 2019-07-04 DIAGNOSIS — D509 Iron deficiency anemia, unspecified: Secondary | ICD-10-CM | POA: Diagnosis not present

## 2019-07-04 DIAGNOSIS — Z79899 Other long term (current) drug therapy: Secondary | ICD-10-CM | POA: Diagnosis not present

## 2019-07-04 DIAGNOSIS — D631 Anemia in chronic kidney disease: Secondary | ICD-10-CM | POA: Diagnosis not present

## 2019-07-04 DIAGNOSIS — N2581 Secondary hyperparathyroidism of renal origin: Secondary | ICD-10-CM | POA: Diagnosis not present

## 2019-07-04 DIAGNOSIS — N186 End stage renal disease: Secondary | ICD-10-CM | POA: Diagnosis not present

## 2019-07-06 ENCOUNTER — Ambulatory Visit (INDEPENDENT_AMBULATORY_CARE_PROVIDER_SITE_OTHER): Payer: Medicare Other | Admitting: Nurse Practitioner

## 2019-07-06 ENCOUNTER — Other Ambulatory Visit: Payer: Self-pay

## 2019-07-06 ENCOUNTER — Encounter: Payer: Self-pay | Admitting: Nurse Practitioner

## 2019-07-06 VITALS — BP 126/84 | HR 93 | Temp 97.8°F | Ht <= 58 in | Wt 147.7 lb

## 2019-07-06 DIAGNOSIS — E1169 Type 2 diabetes mellitus with other specified complication: Secondary | ICD-10-CM

## 2019-07-06 DIAGNOSIS — I150 Renovascular hypertension: Secondary | ICD-10-CM

## 2019-07-06 DIAGNOSIS — L708 Other acne: Secondary | ICD-10-CM | POA: Diagnosis not present

## 2019-07-06 DIAGNOSIS — E1122 Type 2 diabetes mellitus with diabetic chronic kidney disease: Secondary | ICD-10-CM | POA: Diagnosis not present

## 2019-07-06 DIAGNOSIS — Z794 Long term (current) use of insulin: Secondary | ICD-10-CM

## 2019-07-06 DIAGNOSIS — G546 Phantom limb syndrome with pain: Secondary | ICD-10-CM

## 2019-07-06 DIAGNOSIS — Z89511 Acquired absence of right leg below knee: Secondary | ICD-10-CM

## 2019-07-06 DIAGNOSIS — N186 End stage renal disease: Secondary | ICD-10-CM

## 2019-07-06 DIAGNOSIS — Z89512 Acquired absence of left leg below knee: Secondary | ICD-10-CM | POA: Diagnosis not present

## 2019-07-06 DIAGNOSIS — Z992 Dependence on renal dialysis: Secondary | ICD-10-CM

## 2019-07-06 DIAGNOSIS — E785 Hyperlipidemia, unspecified: Secondary | ICD-10-CM

## 2019-07-06 MED ORDER — DOXYCYCLINE HYCLATE 100 MG PO TABS: 100.0000 mg | ORAL_TABLET | Freq: Two times a day (BID) | ORAL | 0 refills | Status: DC

## 2019-07-06 MED ORDER — FREESTYLE LIBRE 14 DAY READER DEVI: 1.0000 | Freq: Every day | 12 refills | Status: DC | PRN

## 2019-07-06 MED ORDER — FREESTYLE LIBRE 14 DAY SENSOR MISC: 1.0000 [IU] | Freq: Two times a day (BID) | 2 refills | Status: DC

## 2019-07-06 NOTE — Patient Instructions (Signed)
To take doxycyline twice daily with food for 10 days  To take with probiotic twice daily while on antibiotics
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
## 2019-07-07 DIAGNOSIS — N2581 Secondary hyperparathyroidism of renal origin: Secondary | ICD-10-CM | POA: Diagnosis not present

## 2019-07-07 DIAGNOSIS — Z992 Dependence on renal dialysis: Secondary | ICD-10-CM | POA: Diagnosis not present

## 2019-07-07 DIAGNOSIS — Z79899 Other long term (current) drug therapy: Secondary | ICD-10-CM | POA: Diagnosis not present

## 2019-07-07 DIAGNOSIS — N186 End stage renal disease: Secondary | ICD-10-CM | POA: Diagnosis not present

## 2019-07-07 DIAGNOSIS — D631 Anemia in chronic kidney disease: Secondary | ICD-10-CM | POA: Diagnosis not present

## 2019-07-07 DIAGNOSIS — D509 Iron deficiency anemia, unspecified: Secondary | ICD-10-CM | POA: Diagnosis not present

## 2019-07-08 ENCOUNTER — Ambulatory Visit: Payer: Medicare Other | Admitting: Registered"

## 2019-07-08 ENCOUNTER — Ambulatory Visit: Payer: Medicare Other | Admitting: Physical Therapy

## 2019-07-08 ENCOUNTER — Telehealth: Payer: Self-pay

## 2019-07-08 DIAGNOSIS — N186 End stage renal disease: Secondary | ICD-10-CM | POA: Diagnosis not present

## 2019-07-08 DIAGNOSIS — E1122 Type 2 diabetes mellitus with diabetic chronic kidney disease: Secondary | ICD-10-CM | POA: Diagnosis not present

## 2019-07-08 DIAGNOSIS — Z992 Dependence on renal dialysis: Secondary | ICD-10-CM | POA: Diagnosis not present

## 2019-07-08 NOTE — Telephone Encounter (Addendum)
Prior authorization request received from [place] for [facility] 14 Reader and [employer] 14 day sensor. Prior authorization started through cover my meds  Prior authorization was denied for both reader and sensor

## 2019-07-09 DIAGNOSIS — Z4932 Encounter for adequacy testing for peritoneal dialysis: Secondary | ICD-10-CM | POA: Diagnosis not present

## 2019-07-09 DIAGNOSIS — D509 Iron deficiency anemia, unspecified: Secondary | ICD-10-CM | POA: Diagnosis not present

## 2019-07-09 DIAGNOSIS — E875 Hyperkalemia: Secondary | ICD-10-CM | POA: Diagnosis not present

## 2019-07-09 DIAGNOSIS — R82998 Other abnormal findings in urine: Secondary | ICD-10-CM | POA: Diagnosis not present

## 2019-07-09 DIAGNOSIS — N2581 Secondary hyperparathyroidism of renal origin: Secondary | ICD-10-CM | POA: Diagnosis not present

## 2019-07-09 DIAGNOSIS — Z992 Dependence on renal dialysis: Secondary | ICD-10-CM | POA: Diagnosis not present

## 2019-07-09 DIAGNOSIS — N186 End stage renal disease: Secondary | ICD-10-CM | POA: Diagnosis not present

## 2019-07-09 DIAGNOSIS — D631 Anemia in chronic kidney disease: Secondary | ICD-10-CM | POA: Diagnosis not present

## 2019-07-11 DIAGNOSIS — E875 Hyperkalemia: Secondary | ICD-10-CM | POA: Diagnosis not present

## 2019-07-11 DIAGNOSIS — Z4932 Encounter for adequacy testing for peritoneal dialysis: Secondary | ICD-10-CM | POA: Diagnosis not present

## 2019-07-11 DIAGNOSIS — R82998 Other abnormal findings in urine: Secondary | ICD-10-CM | POA: Diagnosis not present

## 2019-07-11 DIAGNOSIS — D509 Iron deficiency anemia, unspecified: Secondary | ICD-10-CM | POA: Diagnosis not present

## 2019-07-11 DIAGNOSIS — N186 End stage renal disease: Secondary | ICD-10-CM | POA: Diagnosis not present

## 2019-07-11 DIAGNOSIS — N2581 Secondary hyperparathyroidism of renal origin: Secondary | ICD-10-CM | POA: Diagnosis not present

## 2019-07-11 DIAGNOSIS — D631 Anemia in chronic kidney disease: Secondary | ICD-10-CM | POA: Diagnosis not present

## 2019-07-11 DIAGNOSIS — Z992 Dependence on renal dialysis: Secondary | ICD-10-CM | POA: Diagnosis not present

## 2019-07-14 DIAGNOSIS — Z89511 Acquired absence of right leg below knee: Secondary | ICD-10-CM | POA: Diagnosis not present

## 2019-07-14 DIAGNOSIS — I120 Hypertensive chronic kidney disease with stage 5 chronic kidney disease or end stage renal disease: Secondary | ICD-10-CM | POA: Diagnosis not present

## 2019-07-14 DIAGNOSIS — R82998 Other abnormal findings in urine: Secondary | ICD-10-CM | POA: Diagnosis not present

## 2019-07-14 DIAGNOSIS — N186 End stage renal disease: Secondary | ICD-10-CM | POA: Diagnosis not present

## 2019-07-14 DIAGNOSIS — Z89512 Acquired absence of left leg below knee: Secondary | ICD-10-CM | POA: Diagnosis not present

## 2019-07-14 DIAGNOSIS — Z4932 Encounter for adequacy testing for peritoneal dialysis: Secondary | ICD-10-CM | POA: Diagnosis not present

## 2019-07-14 DIAGNOSIS — N2581 Secondary hyperparathyroidism of renal origin: Secondary | ICD-10-CM | POA: Diagnosis not present

## 2019-07-14 DIAGNOSIS — Z992 Dependence on renal dialysis: Secondary | ICD-10-CM | POA: Diagnosis not present

## 2019-07-14 DIAGNOSIS — D509 Iron deficiency anemia, unspecified: Secondary | ICD-10-CM | POA: Diagnosis not present

## 2019-07-14 DIAGNOSIS — E875 Hyperkalemia: Secondary | ICD-10-CM | POA: Diagnosis not present

## 2019-07-14 DIAGNOSIS — D631 Anemia in chronic kidney disease: Secondary | ICD-10-CM | POA: Diagnosis not present

## 2019-07-16 ENCOUNTER — Other Ambulatory Visit: Payer: Self-pay | Admitting: Adult Health

## 2019-07-16 ENCOUNTER — Other Ambulatory Visit: Payer: Self-pay | Admitting: Nurse Practitioner

## 2019-07-16 DIAGNOSIS — R82998 Other abnormal findings in urine: Secondary | ICD-10-CM | POA: Diagnosis not present

## 2019-07-16 DIAGNOSIS — D631 Anemia in chronic kidney disease: Secondary | ICD-10-CM | POA: Diagnosis not present

## 2019-07-16 DIAGNOSIS — E1169 Type 2 diabetes mellitus with other specified complication: Secondary | ICD-10-CM

## 2019-07-16 DIAGNOSIS — N2581 Secondary hyperparathyroidism of renal origin: Secondary | ICD-10-CM | POA: Diagnosis not present

## 2019-07-16 DIAGNOSIS — Z4932 Encounter for adequacy testing for peritoneal dialysis: Secondary | ICD-10-CM | POA: Diagnosis not present

## 2019-07-16 DIAGNOSIS — E875 Hyperkalemia: Secondary | ICD-10-CM | POA: Diagnosis not present

## 2019-07-16 DIAGNOSIS — D509 Iron deficiency anemia, unspecified: Secondary | ICD-10-CM | POA: Diagnosis not present

## 2019-07-16 DIAGNOSIS — E785 Hyperlipidemia, unspecified: Secondary | ICD-10-CM

## 2019-07-16 DIAGNOSIS — N186 End stage renal disease: Secondary | ICD-10-CM | POA: Diagnosis not present

## 2019-07-16 DIAGNOSIS — Z992 Dependence on renal dialysis: Secondary | ICD-10-CM | POA: Diagnosis not present

## 2019-07-18 DIAGNOSIS — R82998 Other abnormal findings in urine: Secondary | ICD-10-CM | POA: Diagnosis not present

## 2019-07-18 DIAGNOSIS — N186 End stage renal disease: Secondary | ICD-10-CM | POA: Diagnosis not present

## 2019-07-18 DIAGNOSIS — Z992 Dependence on renal dialysis: Secondary | ICD-10-CM | POA: Diagnosis not present

## 2019-07-18 DIAGNOSIS — E875 Hyperkalemia: Secondary | ICD-10-CM | POA: Diagnosis not present

## 2019-07-18 DIAGNOSIS — D631 Anemia in chronic kidney disease: Secondary | ICD-10-CM | POA: Diagnosis not present

## 2019-07-18 DIAGNOSIS — Z4932 Encounter for adequacy testing for peritoneal dialysis: Secondary | ICD-10-CM | POA: Diagnosis not present

## 2019-07-18 DIAGNOSIS — D509 Iron deficiency anemia, unspecified: Secondary | ICD-10-CM | POA: Diagnosis not present

## 2019-07-18 DIAGNOSIS — N2581 Secondary hyperparathyroidism of renal origin: Secondary | ICD-10-CM | POA: Diagnosis not present

## 2019-07-20 ENCOUNTER — Ambulatory Visit (INDEPENDENT_AMBULATORY_CARE_PROVIDER_SITE_OTHER): Payer: Medicaid Other | Admitting: Physician Assistant

## 2019-07-20 ENCOUNTER — Encounter: Payer: Self-pay | Admitting: Physician Assistant

## 2019-07-20 ENCOUNTER — Other Ambulatory Visit: Payer: Self-pay

## 2019-07-20 VITALS — Ht <= 58 in | Wt 147.7 lb

## 2019-07-20 DIAGNOSIS — Z89511 Acquired absence of right leg below knee: Secondary | ICD-10-CM

## 2019-07-20 DIAGNOSIS — Z89512 Acquired absence of left leg below knee: Secondary | ICD-10-CM | POA: Diagnosis not present
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
## 2019-07-21 DIAGNOSIS — N2581 Secondary hyperparathyroidism of renal origin: Secondary | ICD-10-CM | POA: Diagnosis not present

## 2019-07-21 DIAGNOSIS — Z4932 Encounter for adequacy testing for peritoneal dialysis: Secondary | ICD-10-CM | POA: Diagnosis not present

## 2019-07-21 DIAGNOSIS — E875 Hyperkalemia: Secondary | ICD-10-CM | POA: Diagnosis not present

## 2019-07-21 DIAGNOSIS — D631 Anemia in chronic kidney disease: Secondary | ICD-10-CM | POA: Diagnosis not present

## 2019-07-21 DIAGNOSIS — Z992 Dependence on renal dialysis: Secondary | ICD-10-CM | POA: Diagnosis not present

## 2019-07-21 DIAGNOSIS — R82998 Other abnormal findings in urine: Secondary | ICD-10-CM | POA: Diagnosis not present

## 2019-07-21 DIAGNOSIS — N186 End stage renal disease: Secondary | ICD-10-CM | POA: Diagnosis not present

## 2019-07-21 DIAGNOSIS — D509 Iron deficiency anemia, unspecified: Secondary | ICD-10-CM | POA: Diagnosis not present

## 2019-07-23 DIAGNOSIS — D631 Anemia in chronic kidney disease: Secondary | ICD-10-CM | POA: Diagnosis not present

## 2019-07-23 DIAGNOSIS — E875 Hyperkalemia: Secondary | ICD-10-CM | POA: Diagnosis not present

## 2019-07-23 DIAGNOSIS — N2581 Secondary hyperparathyroidism of renal origin: Secondary | ICD-10-CM | POA: Diagnosis not present

## 2019-07-23 DIAGNOSIS — Z992 Dependence on renal dialysis: Secondary | ICD-10-CM | POA: Diagnosis not present

## 2019-07-23 DIAGNOSIS — Z4932 Encounter for adequacy testing for peritoneal dialysis: Secondary | ICD-10-CM | POA: Diagnosis not present

## 2019-07-23 DIAGNOSIS — D509 Iron deficiency anemia, unspecified: Secondary | ICD-10-CM | POA: Diagnosis not present

## 2019-07-23 DIAGNOSIS — N186 End stage renal disease: Secondary | ICD-10-CM | POA: Diagnosis not present

## 2019-07-23 DIAGNOSIS — R82998 Other abnormal findings in urine: Secondary | ICD-10-CM | POA: Diagnosis not present

## 2019-07-25 DIAGNOSIS — D509 Iron deficiency anemia, unspecified: Secondary | ICD-10-CM | POA: Diagnosis not present

## 2019-07-25 DIAGNOSIS — N186 End stage renal disease: Secondary | ICD-10-CM | POA: Diagnosis not present

## 2019-07-25 DIAGNOSIS — Z4932 Encounter for adequacy testing for peritoneal dialysis: Secondary | ICD-10-CM | POA: Diagnosis not present

## 2019-07-25 DIAGNOSIS — D631 Anemia in chronic kidney disease: Secondary | ICD-10-CM | POA: Diagnosis not present

## 2019-07-25 DIAGNOSIS — E875 Hyperkalemia: Secondary | ICD-10-CM | POA: Diagnosis not present

## 2019-07-25 DIAGNOSIS — N2581 Secondary hyperparathyroidism of renal origin: Secondary | ICD-10-CM | POA: Diagnosis not present

## 2019-07-25 DIAGNOSIS — R82998 Other abnormal findings in urine: Secondary | ICD-10-CM | POA: Diagnosis not present

## 2019-07-25 DIAGNOSIS — Z992 Dependence on renal dialysis: Secondary | ICD-10-CM | POA: Diagnosis not present

## 2019-07-28 DIAGNOSIS — Z992 Dependence on renal dialysis: Secondary | ICD-10-CM | POA: Diagnosis not present

## 2019-07-28 DIAGNOSIS — E875 Hyperkalemia: Secondary | ICD-10-CM | POA: Diagnosis not present

## 2019-07-28 DIAGNOSIS — N186 End stage renal disease: Secondary | ICD-10-CM | POA: Diagnosis not present

## 2019-07-28 DIAGNOSIS — Z4932 Encounter for adequacy testing for peritoneal dialysis: Secondary | ICD-10-CM | POA: Diagnosis not present

## 2019-07-28 DIAGNOSIS — D631 Anemia in chronic kidney disease: Secondary | ICD-10-CM | POA: Diagnosis not present

## 2019-07-28 DIAGNOSIS — D509 Iron deficiency anemia, unspecified: Secondary | ICD-10-CM | POA: Diagnosis not present

## 2019-07-28 DIAGNOSIS — N2581 Secondary hyperparathyroidism of renal origin: Secondary | ICD-10-CM | POA: Diagnosis not present

## 2019-07-28 DIAGNOSIS — R82998 Other abnormal findings in urine: Secondary | ICD-10-CM | POA: Diagnosis not present

## 2019-07-29 DIAGNOSIS — N2581 Secondary hyperparathyroidism of renal origin: Secondary | ICD-10-CM | POA: Diagnosis not present

## 2019-07-29 DIAGNOSIS — Z4932 Encounter for adequacy testing for peritoneal dialysis: Secondary | ICD-10-CM | POA: Diagnosis not present

## 2019-07-29 DIAGNOSIS — N186 End stage renal disease: Secondary | ICD-10-CM | POA: Diagnosis not present

## 2019-07-29 DIAGNOSIS — E875 Hyperkalemia: Secondary | ICD-10-CM | POA: Diagnosis not present

## 2019-07-29 DIAGNOSIS — D631 Anemia in chronic kidney disease: Secondary | ICD-10-CM | POA: Diagnosis not present

## 2019-07-29 DIAGNOSIS — Z992 Dependence on renal dialysis: Secondary | ICD-10-CM | POA: Diagnosis not present

## 2019-07-29 DIAGNOSIS — D509 Iron deficiency anemia, unspecified: Secondary | ICD-10-CM | POA: Diagnosis not present

## 2019-07-29 DIAGNOSIS — R82998 Other abnormal findings in urine: Secondary | ICD-10-CM | POA: Diagnosis not present

## 2019-07-30 DIAGNOSIS — D631 Anemia in chronic kidney disease: Secondary | ICD-10-CM | POA: Diagnosis not present

## 2019-07-30 DIAGNOSIS — Z4932 Encounter for adequacy testing for peritoneal dialysis: Secondary | ICD-10-CM | POA: Diagnosis not present

## 2019-07-30 DIAGNOSIS — Z992 Dependence on renal dialysis: Secondary | ICD-10-CM | POA: Diagnosis not present

## 2019-07-30 DIAGNOSIS — R82998 Other abnormal findings in urine: Secondary | ICD-10-CM | POA: Diagnosis not present

## 2019-07-30 DIAGNOSIS — N2581 Secondary hyperparathyroidism of renal origin: Secondary | ICD-10-CM | POA: Diagnosis not present

## 2019-07-30 DIAGNOSIS — E875 Hyperkalemia: Secondary | ICD-10-CM | POA: Diagnosis not present

## 2019-07-30 DIAGNOSIS — D509 Iron deficiency anemia, unspecified: Secondary | ICD-10-CM | POA: Diagnosis not present

## 2019-07-30 DIAGNOSIS — N186 End stage renal disease: Secondary | ICD-10-CM | POA: Diagnosis not present

## 2019-08-01 DIAGNOSIS — Z4932 Encounter for adequacy testing for peritoneal dialysis: Secondary | ICD-10-CM | POA: Diagnosis not present

## 2019-08-01 DIAGNOSIS — Z992 Dependence on renal dialysis: Secondary | ICD-10-CM | POA: Diagnosis not present

## 2019-08-01 DIAGNOSIS — N186 End stage renal disease: Secondary | ICD-10-CM | POA: Diagnosis not present

## 2019-08-01 DIAGNOSIS — D509 Iron deficiency anemia, unspecified: Secondary | ICD-10-CM | POA: Diagnosis not present

## 2019-08-01 DIAGNOSIS — E875 Hyperkalemia: Secondary | ICD-10-CM | POA: Diagnosis not present

## 2019-08-01 DIAGNOSIS — R82998 Other abnormal findings in urine: Secondary | ICD-10-CM | POA: Diagnosis not present

## 2019-08-01 DIAGNOSIS — N2581 Secondary hyperparathyroidism of renal origin: Secondary | ICD-10-CM | POA: Diagnosis not present

## 2019-08-01 DIAGNOSIS — D631 Anemia in chronic kidney disease: Secondary | ICD-10-CM | POA: Diagnosis not present

## 2019-08-03 DIAGNOSIS — E875 Hyperkalemia: Secondary | ICD-10-CM | POA: Diagnosis not present

## 2019-08-03 DIAGNOSIS — D509 Iron deficiency anemia, unspecified: Secondary | ICD-10-CM | POA: Diagnosis not present

## 2019-08-03 DIAGNOSIS — Z4932 Encounter for adequacy testing for peritoneal dialysis: Secondary | ICD-10-CM | POA: Diagnosis not present

## 2019-08-03 DIAGNOSIS — R82998 Other abnormal findings in urine: Secondary | ICD-10-CM | POA: Diagnosis not present

## 2019-08-03 DIAGNOSIS — N186 End stage renal disease: Secondary | ICD-10-CM | POA: Diagnosis not present

## 2019-08-03 DIAGNOSIS — Z992 Dependence on renal dialysis: Secondary | ICD-10-CM | POA: Diagnosis not present

## 2019-08-03 DIAGNOSIS — N2581 Secondary hyperparathyroidism of renal origin: Secondary | ICD-10-CM | POA: Diagnosis not present

## 2019-08-03 DIAGNOSIS — D631 Anemia in chronic kidney disease: Secondary | ICD-10-CM | POA: Diagnosis not present

## 2019-08-04 DIAGNOSIS — Z4932 Encounter for adequacy testing for peritoneal dialysis: Secondary | ICD-10-CM | POA: Diagnosis not present

## 2019-08-04 DIAGNOSIS — R82998 Other abnormal findings in urine: Secondary | ICD-10-CM | POA: Diagnosis not present

## 2019-08-04 DIAGNOSIS — D509 Iron deficiency anemia, unspecified: Secondary | ICD-10-CM | POA: Diagnosis not present

## 2019-08-04 DIAGNOSIS — Z992 Dependence on renal dialysis: Secondary | ICD-10-CM | POA: Diagnosis not present

## 2019-08-04 DIAGNOSIS — D631 Anemia in chronic kidney disease: Secondary | ICD-10-CM | POA: Diagnosis not present

## 2019-08-04 DIAGNOSIS — N186 End stage renal disease: Secondary | ICD-10-CM | POA: Diagnosis not present

## 2019-08-04 DIAGNOSIS — N2581 Secondary hyperparathyroidism of renal origin: Secondary | ICD-10-CM | POA: Diagnosis not present

## 2019-08-04 DIAGNOSIS — E875 Hyperkalemia: Secondary | ICD-10-CM | POA: Diagnosis not present

## 2019-08-05 ENCOUNTER — Ambulatory Visit: Payer: Medicaid Other | Admitting: Rehabilitation

## 2019-08-05 DIAGNOSIS — N2581 Secondary hyperparathyroidism of renal origin: Secondary | ICD-10-CM | POA: Diagnosis not present

## 2019-08-05 DIAGNOSIS — Z992 Dependence on renal dialysis: Secondary | ICD-10-CM | POA: Diagnosis not present

## 2019-08-05 DIAGNOSIS — R82998 Other abnormal findings in urine: Secondary | ICD-10-CM | POA: Diagnosis not present

## 2019-08-05 DIAGNOSIS — E875 Hyperkalemia: Secondary | ICD-10-CM | POA: Diagnosis not present

## 2019-08-05 DIAGNOSIS — D631 Anemia in chronic kidney disease: Secondary | ICD-10-CM | POA: Diagnosis not present

## 2019-08-05 DIAGNOSIS — N186 End stage renal disease: Secondary | ICD-10-CM | POA: Diagnosis not present

## 2019-08-05 DIAGNOSIS — D509 Iron deficiency anemia, unspecified: Secondary | ICD-10-CM | POA: Diagnosis not present

## 2019-08-05 DIAGNOSIS — Z4932 Encounter for adequacy testing for peritoneal dialysis: Secondary | ICD-10-CM | POA: Diagnosis not present

## 2019-08-06 DIAGNOSIS — Z4932 Encounter for adequacy testing for peritoneal dialysis: Secondary | ICD-10-CM | POA: Diagnosis not present

## 2019-08-06 DIAGNOSIS — R82998 Other abnormal findings in urine: Secondary | ICD-10-CM | POA: Diagnosis not present

## 2019-08-06 DIAGNOSIS — E875 Hyperkalemia: Secondary | ICD-10-CM | POA: Diagnosis not present

## 2019-08-06 DIAGNOSIS — N186 End stage renal disease: Secondary | ICD-10-CM | POA: Diagnosis not present

## 2019-08-06 DIAGNOSIS — D631 Anemia in chronic kidney disease: Secondary | ICD-10-CM | POA: Diagnosis not present

## 2019-08-06 DIAGNOSIS — D509 Iron deficiency anemia, unspecified: Secondary | ICD-10-CM | POA: Diagnosis not present

## 2019-08-06 DIAGNOSIS — N2581 Secondary hyperparathyroidism of renal origin: Secondary | ICD-10-CM | POA: Diagnosis not present

## 2019-08-06 DIAGNOSIS — Z992 Dependence on renal dialysis: Secondary | ICD-10-CM | POA: Diagnosis not present

## 2019-08-07 DIAGNOSIS — Z992 Dependence on renal dialysis: Secondary | ICD-10-CM | POA: Diagnosis not present

## 2019-08-07 DIAGNOSIS — N186 End stage renal disease: Secondary | ICD-10-CM | POA: Diagnosis not present

## 2019-08-07 DIAGNOSIS — D509 Iron deficiency anemia, unspecified: Secondary | ICD-10-CM | POA: Diagnosis not present

## 2019-08-07 DIAGNOSIS — E875 Hyperkalemia: Secondary | ICD-10-CM | POA: Diagnosis not present

## 2019-08-07 DIAGNOSIS — N2581 Secondary hyperparathyroidism of renal origin: Secondary | ICD-10-CM | POA: Diagnosis not present

## 2019-08-07 DIAGNOSIS — Z4932 Encounter for adequacy testing for peritoneal dialysis: Secondary | ICD-10-CM | POA: Diagnosis not present

## 2019-08-07 DIAGNOSIS — R82998 Other abnormal findings in urine: Secondary | ICD-10-CM | POA: Diagnosis not present

## 2019-08-07 DIAGNOSIS — D631 Anemia in chronic kidney disease: Secondary | ICD-10-CM | POA: Diagnosis not present

## 2019-08-08 DIAGNOSIS — D509 Iron deficiency anemia, unspecified: Secondary | ICD-10-CM | POA: Diagnosis not present

## 2019-08-08 DIAGNOSIS — Z4932 Encounter for adequacy testing for peritoneal dialysis: Secondary | ICD-10-CM | POA: Diagnosis not present

## 2019-08-08 DIAGNOSIS — E1122 Type 2 diabetes mellitus with diabetic chronic kidney disease: Secondary | ICD-10-CM | POA: Diagnosis not present

## 2019-08-08 DIAGNOSIS — N2581 Secondary hyperparathyroidism of renal origin: Secondary | ICD-10-CM | POA: Diagnosis not present

## 2019-08-08 DIAGNOSIS — D631 Anemia in chronic kidney disease: Secondary | ICD-10-CM | POA: Diagnosis not present

## 2019-08-08 DIAGNOSIS — Z992 Dependence on renal dialysis: Secondary | ICD-10-CM | POA: Diagnosis not present

## 2019-08-08 DIAGNOSIS — R82998 Other abnormal findings in urine: Secondary | ICD-10-CM | POA: Diagnosis not present

## 2019-08-08 DIAGNOSIS — N186 End stage renal disease: Secondary | ICD-10-CM | POA: Diagnosis not present

## 2019-08-08 DIAGNOSIS — E875 Hyperkalemia: Secondary | ICD-10-CM | POA: Diagnosis not present

## 2019-08-09 DIAGNOSIS — Z992 Dependence on renal dialysis: Secondary | ICD-10-CM | POA: Diagnosis not present

## 2019-08-09 DIAGNOSIS — Z79899 Other long term (current) drug therapy: Secondary | ICD-10-CM | POA: Diagnosis not present

## 2019-08-09 DIAGNOSIS — N186 End stage renal disease: Secondary | ICD-10-CM | POA: Diagnosis not present

## 2019-08-09 DIAGNOSIS — N2581 Secondary hyperparathyroidism of renal origin: Secondary | ICD-10-CM | POA: Diagnosis not present

## 2019-08-09 DIAGNOSIS — D509 Iron deficiency anemia, unspecified: Secondary | ICD-10-CM | POA: Diagnosis not present

## 2019-08-09 DIAGNOSIS — R82998 Other abnormal findings in urine: Secondary | ICD-10-CM | POA: Diagnosis not present

## 2019-08-09 DIAGNOSIS — D631 Anemia in chronic kidney disease: Secondary | ICD-10-CM | POA: Diagnosis not present

## 2019-08-10 DIAGNOSIS — Z992 Dependence on renal dialysis: Secondary | ICD-10-CM | POA: Diagnosis not present

## 2019-08-10 DIAGNOSIS — N186 End stage renal disease: Secondary | ICD-10-CM | POA: Diagnosis not present

## 2019-08-10 DIAGNOSIS — R82998 Other abnormal findings in urine: Secondary | ICD-10-CM | POA: Diagnosis not present

## 2019-08-10 DIAGNOSIS — N2581 Secondary hyperparathyroidism of renal origin: Secondary | ICD-10-CM | POA: Diagnosis not present

## 2019-08-10 DIAGNOSIS — D509 Iron deficiency anemia, unspecified: Secondary | ICD-10-CM | POA: Diagnosis not present

## 2019-08-10 DIAGNOSIS — D631 Anemia in chronic kidney disease: Secondary | ICD-10-CM | POA: Diagnosis not present

## 2019-08-10 DIAGNOSIS — Z79899 Other long term (current) drug therapy: Secondary | ICD-10-CM | POA: Diagnosis not present

## 2019-08-11 DIAGNOSIS — N186 End stage renal disease: Secondary | ICD-10-CM | POA: Diagnosis not present

## 2019-08-11 DIAGNOSIS — D509 Iron deficiency anemia, unspecified: Secondary | ICD-10-CM | POA: Diagnosis not present

## 2019-08-11 DIAGNOSIS — N2581 Secondary hyperparathyroidism of renal origin: Secondary | ICD-10-CM | POA: Diagnosis not present

## 2019-08-11 DIAGNOSIS — R82998 Other abnormal findings in urine: Secondary | ICD-10-CM | POA: Diagnosis not present

## 2019-08-11 DIAGNOSIS — D631 Anemia in chronic kidney disease: Secondary | ICD-10-CM | POA: Diagnosis not present

## 2019-08-11 DIAGNOSIS — Z79899 Other long term (current) drug therapy: Secondary | ICD-10-CM | POA: Diagnosis not present

## 2019-08-11 DIAGNOSIS — Z992 Dependence on renal dialysis: Secondary | ICD-10-CM | POA: Diagnosis not present

## 2019-08-12 ENCOUNTER — Other Ambulatory Visit: Payer: Self-pay

## 2019-08-12 ENCOUNTER — Encounter: Payer: Self-pay | Admitting: Rehabilitation

## 2019-08-12 ENCOUNTER — Ambulatory Visit: Payer: Medicare Other | Attending: Physician Assistant | Admitting: Rehabilitation

## 2019-08-12 DIAGNOSIS — M6281 Muscle weakness (generalized): Secondary | ICD-10-CM | POA: Insufficient documentation

## 2019-08-12 DIAGNOSIS — N2581 Secondary hyperparathyroidism of renal origin: Secondary | ICD-10-CM | POA: Diagnosis not present

## 2019-08-12 DIAGNOSIS — R2689 Other abnormalities of gait and mobility: Secondary | ICD-10-CM | POA: Insufficient documentation

## 2019-08-12 DIAGNOSIS — D509 Iron deficiency anemia, unspecified: Secondary | ICD-10-CM | POA: Diagnosis not present

## 2019-08-12 DIAGNOSIS — R2681 Unsteadiness on feet: Secondary | ICD-10-CM

## 2019-08-12 DIAGNOSIS — R296 Repeated falls: Secondary | ICD-10-CM | POA: Diagnosis not present

## 2019-08-12 DIAGNOSIS — D631 Anemia in chronic kidney disease: Secondary | ICD-10-CM | POA: Diagnosis not present

## 2019-08-12 DIAGNOSIS — R82998 Other abnormal findings in urine: Secondary | ICD-10-CM | POA: Diagnosis not present

## 2019-08-12 DIAGNOSIS — Z992 Dependence on renal dialysis: Secondary | ICD-10-CM | POA: Diagnosis not present

## 2019-08-12 DIAGNOSIS — Z79899 Other long term (current) drug therapy: Secondary | ICD-10-CM | POA: Diagnosis not present

## 2019-08-12 DIAGNOSIS — N186 End stage renal disease: Secondary | ICD-10-CM | POA: Diagnosis not present
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
## 2019-08-13 ENCOUNTER — Other Ambulatory Visit: Payer: Self-pay | Admitting: Adult Health

## 2019-08-13 DIAGNOSIS — N2581 Secondary hyperparathyroidism of renal origin: Secondary | ICD-10-CM | POA: Diagnosis not present

## 2019-08-13 DIAGNOSIS — D631 Anemia in chronic kidney disease: Secondary | ICD-10-CM | POA: Diagnosis not present

## 2019-08-13 DIAGNOSIS — D509 Iron deficiency anemia, unspecified: Secondary | ICD-10-CM | POA: Diagnosis not present

## 2019-08-13 DIAGNOSIS — N186 End stage renal disease: Secondary | ICD-10-CM | POA: Diagnosis not present

## 2019-08-13 DIAGNOSIS — R82998 Other abnormal findings in urine: Secondary | ICD-10-CM | POA: Diagnosis not present

## 2019-08-13 DIAGNOSIS — Z794 Long term (current) use of insulin: Secondary | ICD-10-CM

## 2019-08-13 DIAGNOSIS — Z992 Dependence on renal dialysis: Secondary | ICD-10-CM | POA: Diagnosis not present

## 2019-08-13 DIAGNOSIS — Z79899 Other long term (current) drug therapy: Secondary | ICD-10-CM | POA: Diagnosis not present

## 2019-08-14 DIAGNOSIS — Z89511 Acquired absence of right leg below knee: Secondary | ICD-10-CM | POA: Diagnosis not present

## 2019-08-14 DIAGNOSIS — D509 Iron deficiency anemia, unspecified: Secondary | ICD-10-CM | POA: Diagnosis not present

## 2019-08-14 DIAGNOSIS — Z89512 Acquired absence of left leg below knee: Secondary | ICD-10-CM | POA: Diagnosis not present

## 2019-08-14 DIAGNOSIS — Z79899 Other long term (current) drug therapy: Secondary | ICD-10-CM | POA: Diagnosis not present

## 2019-08-14 DIAGNOSIS — Z992 Dependence on renal dialysis: Secondary | ICD-10-CM | POA: Diagnosis not present

## 2019-08-14 DIAGNOSIS — I120 Hypertensive chronic kidney disease with stage 5 chronic kidney disease or end stage renal disease: Secondary | ICD-10-CM | POA: Diagnosis not present

## 2019-08-14 DIAGNOSIS — R82998 Other abnormal findings in urine: Secondary | ICD-10-CM | POA: Diagnosis not present

## 2019-08-14 DIAGNOSIS — N2581 Secondary hyperparathyroidism of renal origin: Secondary | ICD-10-CM | POA: Diagnosis not present

## 2019-08-14 DIAGNOSIS — D631 Anemia in chronic kidney disease: Secondary | ICD-10-CM | POA: Diagnosis not present

## 2019-08-14 DIAGNOSIS — N186 End stage renal disease: Secondary | ICD-10-CM | POA: Diagnosis not present

## 2019-08-15 ENCOUNTER — Other Ambulatory Visit: Payer: Self-pay | Admitting: Nurse Practitioner

## 2019-08-15 DIAGNOSIS — D631 Anemia in chronic kidney disease: Secondary | ICD-10-CM | POA: Diagnosis not present

## 2019-08-15 DIAGNOSIS — Z992 Dependence on renal dialysis: Secondary | ICD-10-CM | POA: Diagnosis not present

## 2019-08-15 DIAGNOSIS — N2581 Secondary hyperparathyroidism of renal origin: Secondary | ICD-10-CM | POA: Diagnosis not present

## 2019-08-15 DIAGNOSIS — N186 End stage renal disease: Secondary | ICD-10-CM | POA: Diagnosis not present

## 2019-08-15 DIAGNOSIS — D509 Iron deficiency anemia, unspecified: Secondary | ICD-10-CM | POA: Diagnosis not present

## 2019-08-15 DIAGNOSIS — Z79899 Other long term (current) drug therapy: Secondary | ICD-10-CM | POA: Diagnosis not present

## 2019-08-15 DIAGNOSIS — R82998 Other abnormal findings in urine: Secondary | ICD-10-CM | POA: Diagnosis not present

## 2019-08-15 DIAGNOSIS — E785 Hyperlipidemia, unspecified: Secondary | ICD-10-CM

## 2019-08-16 DIAGNOSIS — Z79899 Other long term (current) drug therapy: Secondary | ICD-10-CM | POA: Diagnosis not present

## 2019-08-16 DIAGNOSIS — D509 Iron deficiency anemia, unspecified: Secondary | ICD-10-CM | POA: Diagnosis not present

## 2019-08-16 DIAGNOSIS — R82998 Other abnormal findings in urine: Secondary | ICD-10-CM | POA: Diagnosis not present

## 2019-08-16 DIAGNOSIS — N186 End stage renal disease: Secondary | ICD-10-CM | POA: Diagnosis not present

## 2019-08-16 DIAGNOSIS — D631 Anemia in chronic kidney disease: Secondary | ICD-10-CM | POA: Diagnosis not present

## 2019-08-16 DIAGNOSIS — N2581 Secondary hyperparathyroidism of renal origin: Secondary | ICD-10-CM | POA: Diagnosis not present

## 2019-08-16 DIAGNOSIS — Z992 Dependence on renal dialysis: Secondary | ICD-10-CM | POA: Diagnosis not present

## 2019-08-17 ENCOUNTER — Ambulatory Visit: Payer: Medicaid Other | Admitting: Registered"

## 2019-08-17 DIAGNOSIS — N2581 Secondary hyperparathyroidism of renal origin: Secondary | ICD-10-CM | POA: Diagnosis not present

## 2019-08-17 DIAGNOSIS — Z79899 Other long term (current) drug therapy: Secondary | ICD-10-CM | POA: Diagnosis not present

## 2019-08-17 DIAGNOSIS — N186 End stage renal disease: Secondary | ICD-10-CM | POA: Diagnosis not present

## 2019-08-17 DIAGNOSIS — D509 Iron deficiency anemia, unspecified: Secondary | ICD-10-CM | POA: Diagnosis not present

## 2019-08-17 DIAGNOSIS — D631 Anemia in chronic kidney disease: Secondary | ICD-10-CM | POA: Diagnosis not present

## 2019-08-17 DIAGNOSIS — Z992 Dependence on renal dialysis: Secondary | ICD-10-CM | POA: Diagnosis not present

## 2019-08-17 DIAGNOSIS — R82998 Other abnormal findings in urine: Secondary | ICD-10-CM | POA: Diagnosis not present

## 2019-08-18 DIAGNOSIS — Z79899 Other long term (current) drug therapy: Secondary | ICD-10-CM | POA: Diagnosis not present

## 2019-08-18 DIAGNOSIS — D631 Anemia in chronic kidney disease: Secondary | ICD-10-CM | POA: Diagnosis not present

## 2019-08-18 DIAGNOSIS — N186 End stage renal disease: Secondary | ICD-10-CM | POA: Diagnosis not present

## 2019-08-18 DIAGNOSIS — Z992 Dependence on renal dialysis: Secondary | ICD-10-CM | POA: Diagnosis not present

## 2019-08-18 DIAGNOSIS — R82998 Other abnormal findings in urine: Secondary | ICD-10-CM | POA: Diagnosis not present

## 2019-08-18 DIAGNOSIS — D509 Iron deficiency anemia, unspecified: Secondary | ICD-10-CM | POA: Diagnosis not present

## 2019-08-18 DIAGNOSIS — N2581 Secondary hyperparathyroidism of renal origin: Secondary | ICD-10-CM | POA: Diagnosis not present

## 2019-08-19 ENCOUNTER — Other Ambulatory Visit: Payer: Self-pay

## 2019-08-19 ENCOUNTER — Telehealth: Payer: Self-pay

## 2019-08-19 ENCOUNTER — Ambulatory Visit (INDEPENDENT_AMBULATORY_CARE_PROVIDER_SITE_OTHER): Payer: Medicare Other | Admitting: Nurse Practitioner

## 2019-08-19 ENCOUNTER — Encounter: Payer: Self-pay | Admitting: Nurse Practitioner

## 2019-08-19 DIAGNOSIS — D631 Anemia in chronic kidney disease: Secondary | ICD-10-CM | POA: Diagnosis not present

## 2019-08-19 DIAGNOSIS — Z794 Long term (current) use of insulin: Secondary | ICD-10-CM

## 2019-08-19 DIAGNOSIS — N186 End stage renal disease: Secondary | ICD-10-CM | POA: Diagnosis not present

## 2019-08-19 DIAGNOSIS — E1122 Type 2 diabetes mellitus with diabetic chronic kidney disease: Secondary | ICD-10-CM

## 2019-08-19 DIAGNOSIS — G546 Phantom limb syndrome with pain: Secondary | ICD-10-CM | POA: Diagnosis not present

## 2019-08-19 DIAGNOSIS — N2581 Secondary hyperparathyroidism of renal origin: Secondary | ICD-10-CM | POA: Diagnosis not present

## 2019-08-19 DIAGNOSIS — D509 Iron deficiency anemia, unspecified: Secondary | ICD-10-CM | POA: Diagnosis not present

## 2019-08-19 DIAGNOSIS — Z992 Dependence on renal dialysis: Secondary | ICD-10-CM | POA: Diagnosis not present

## 2019-08-19 DIAGNOSIS — E1169 Type 2 diabetes mellitus with other specified complication: Secondary | ICD-10-CM | POA: Diagnosis not present

## 2019-08-19 DIAGNOSIS — E785 Hyperlipidemia, unspecified: Secondary | ICD-10-CM

## 2019-08-19 DIAGNOSIS — I150 Renovascular hypertension: Secondary | ICD-10-CM | POA: Diagnosis not present

## 2019-08-19 DIAGNOSIS — R82998 Other abnormal findings in urine: Secondary | ICD-10-CM | POA: Diagnosis not present

## 2019-08-19 DIAGNOSIS — Z79899 Other long term (current) drug therapy: Secondary | ICD-10-CM | POA: Diagnosis not present

## 2019-08-19 MED ORDER — DEXCOM G6 TRANSMITTER MISC: 11 refills | Status: DC

## 2019-08-19 MED ORDER — CLONIDINE HCL 0.1 MG PO TABS: ORAL_TABLET | ORAL | 0 refills | Status: DC

## 2019-08-19 MED ORDER — DEXCOM G6 SENSOR MISC: 11 refills | Status: DC

## 2019-08-19 MED ORDER — DEXCOM G6 RECEIVER DEVI: 11 refills | Status: DC

## 2019-08-19 MED ORDER — GABAPENTIN 300 MG PO CAPS: 300.0000 mg | ORAL_CAPSULE | Freq: Every day | ORAL | Status: DC | PRN

## 2019-08-19 NOTE — Telephone Encounter (Signed)
[name], Jorge [name] are scheduled for a virtual visit with your provider today.    Just as we do with appointments in the office, we must obtain your consent to participate.  Your consent will be active for this visit and any virtual visit you may have with one of our providers in the next 365 days.    If you have a MyChart account, I can also send a copy of this consent to you electronically.  All virtual visits are billed to your insurance company just like a traditional visit in the office.  As this is a virtual visit, video technology does not allow for your provider to perform a traditional examination.  This may limit your provider's ability to fully assess your condition.  If your provider identifies any concerns that need to be evaluated in person or the need to arrange testing such as labs, EKG, etc, we will make arrangements to do so.    Although advances in technology are sophisticated, we cannot ensure that it will always work on either your end or our end.  If the connection with a video visit is poor, we may have to switch to a telephone visit.  With either a video or telephone visit, we are not always able to ensure that we have a secure connection.   I need to obtain your verbal consent now.   Are you willing to proceed with your visit today?   Jorge [name] has provided verbal consent on 08/19/2019 for a virtual visit (video or telephone).   Jorge [name] [employer], [place] 08/19/2019  3:00 pm
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
## 2019-08-20 DIAGNOSIS — N2581 Secondary hyperparathyroidism of renal origin: Secondary | ICD-10-CM | POA: Diagnosis not present

## 2019-08-20 DIAGNOSIS — Z992 Dependence on renal dialysis: Secondary | ICD-10-CM | POA: Diagnosis not present

## 2019-08-20 DIAGNOSIS — D631 Anemia in chronic kidney disease: Secondary | ICD-10-CM | POA: Diagnosis not present

## 2019-08-20 DIAGNOSIS — N186 End stage renal disease: Secondary | ICD-10-CM | POA: Diagnosis not present

## 2019-08-20 DIAGNOSIS — D509 Iron deficiency anemia, unspecified: Secondary | ICD-10-CM | POA: Diagnosis not present

## 2019-08-20 DIAGNOSIS — Z79899 Other long term (current) drug therapy: Secondary | ICD-10-CM | POA: Diagnosis not present

## 2019-08-20 DIAGNOSIS — R82998 Other abnormal findings in urine: Secondary | ICD-10-CM | POA: Diagnosis not present

## 2019-08-21 ENCOUNTER — Other Ambulatory Visit: Payer: Self-pay

## 2019-08-21 ENCOUNTER — Inpatient Hospital Stay (HOSPITAL_COMMUNITY)
Admission: EM | Admit: 2019-08-21 | Discharge: 2019-08-29 | DRG: 628 | Disposition: A | Discharge status: Home / Self Care | Payer: Medicare Other | Attending: Internal Medicine | Admitting: Internal Medicine

## 2019-08-21 ENCOUNTER — Encounter (HOSPITAL_COMMUNITY): Payer: Self-pay

## 2019-08-21 ENCOUNTER — Telehealth: Payer: Self-pay

## 2019-08-21 DIAGNOSIS — R0602 Shortness of breath: Secondary | ICD-10-CM | POA: Diagnosis not present

## 2019-08-21 DIAGNOSIS — E1143 Type 2 diabetes mellitus with diabetic autonomic (poly)neuropathy: Secondary | ICD-10-CM | POA: Diagnosis present

## 2019-08-21 DIAGNOSIS — M898X9 Other specified disorders of bone, unspecified site: Secondary | ICD-10-CM | POA: Diagnosis present

## 2019-08-21 DIAGNOSIS — Z992 Dependence on renal dialysis: Secondary | ICD-10-CM

## 2019-08-21 DIAGNOSIS — T85611A Breakdown (mechanical) of intraperitoneal dialysis catheter, initial encounter: Secondary | ICD-10-CM | POA: Diagnosis present

## 2019-08-21 DIAGNOSIS — Z8249 Family history of ischemic heart disease and other diseases of the circulatory system: Secondary | ICD-10-CM

## 2019-08-21 DIAGNOSIS — Z79899 Other long term (current) drug therapy: Secondary | ICD-10-CM | POA: Diagnosis not present

## 2019-08-21 DIAGNOSIS — H547 Unspecified visual loss: Secondary | ICD-10-CM | POA: Diagnosis present

## 2019-08-21 DIAGNOSIS — D631 Anemia in chronic kidney disease: Secondary | ICD-10-CM | POA: Diagnosis not present

## 2019-08-21 DIAGNOSIS — Z833 Family history of diabetes mellitus: Secondary | ICD-10-CM

## 2019-08-21 DIAGNOSIS — E869 Volume depletion, unspecified: Secondary | ICD-10-CM | POA: Diagnosis not present

## 2019-08-21 DIAGNOSIS — R066 Hiccough: Secondary | ICD-10-CM | POA: Diagnosis not present

## 2019-08-21 DIAGNOSIS — Z9115 Patient's noncompliance with renal dialysis: Secondary | ICD-10-CM

## 2019-08-21 DIAGNOSIS — Z794 Long term (current) use of insulin: Secondary | ICD-10-CM

## 2019-08-21 DIAGNOSIS — R05 Cough: Secondary | ICD-10-CM | POA: Diagnosis not present

## 2019-08-21 DIAGNOSIS — E119 Type 2 diabetes mellitus without complications: Secondary | ICD-10-CM

## 2019-08-21 DIAGNOSIS — N2581 Secondary hyperparathyroidism of renal origin: Secondary | ICD-10-CM | POA: Diagnosis not present

## 2019-08-21 DIAGNOSIS — K3184 Gastroparesis: Secondary | ICD-10-CM | POA: Diagnosis present

## 2019-08-21 DIAGNOSIS — E1122 Type 2 diabetes mellitus with diabetic chronic kidney disease: Secondary | ICD-10-CM | POA: Diagnosis present

## 2019-08-21 DIAGNOSIS — I952 Hypotension due to drugs: Secondary | ICD-10-CM | POA: Diagnosis not present

## 2019-08-21 DIAGNOSIS — E43 Unspecified severe protein-calorie malnutrition: Secondary | ICD-10-CM | POA: Diagnosis not present

## 2019-08-21 DIAGNOSIS — Z20822 Contact with and (suspected) exposure to covid-19: Secondary | ICD-10-CM | POA: Diagnosis present

## 2019-08-21 DIAGNOSIS — Z6822 Body mass index (BMI) 22.0-22.9, adult: Secondary | ICD-10-CM | POA: Diagnosis not present

## 2019-08-21 DIAGNOSIS — N186 End stage renal disease: Secondary | ICD-10-CM

## 2019-08-21 DIAGNOSIS — I70208 Unspecified atherosclerosis of native arteries of extremities, other extremity: Secondary | ICD-10-CM | POA: Diagnosis present

## 2019-08-21 DIAGNOSIS — I160 Hypertensive urgency: Secondary | ICD-10-CM | POA: Diagnosis present

## 2019-08-21 DIAGNOSIS — Z9114 Patient's other noncompliance with medication regimen: Secondary | ICD-10-CM | POA: Diagnosis not present

## 2019-08-21 DIAGNOSIS — E1151 Type 2 diabetes mellitus with diabetic peripheral angiopathy without gangrene: Secondary | ICD-10-CM | POA: Diagnosis not present

## 2019-08-21 DIAGNOSIS — D509 Iron deficiency anemia, unspecified: Secondary | ICD-10-CM | POA: Diagnosis not present

## 2019-08-21 DIAGNOSIS — N19 Unspecified kidney failure: Secondary | ICD-10-CM

## 2019-08-21 DIAGNOSIS — E11649 Type 2 diabetes mellitus with hypoglycemia without coma: Secondary | ICD-10-CM | POA: Diagnosis not present

## 2019-08-21 DIAGNOSIS — Z87891 Personal history of nicotine dependence: Secondary | ICD-10-CM

## 2019-08-21 DIAGNOSIS — Z7982 Long term (current) use of aspirin: Secondary | ICD-10-CM

## 2019-08-21 DIAGNOSIS — N185 Chronic kidney disease, stage 5: Secondary | ICD-10-CM | POA: Diagnosis not present

## 2019-08-21 DIAGNOSIS — Z89511 Acquired absence of right leg below knee: Secondary | ICD-10-CM

## 2019-08-21 DIAGNOSIS — E875 Hyperkalemia: Secondary | ICD-10-CM | POA: Diagnosis present

## 2019-08-21 DIAGNOSIS — E049 Nontoxic goiter, unspecified: Secondary | ICD-10-CM | POA: Diagnosis not present

## 2019-08-21 DIAGNOSIS — Z89512 Acquired absence of left leg below knee: Secondary | ICD-10-CM

## 2019-08-21 DIAGNOSIS — Z419 Encounter for procedure for purposes other than remedying health state, unspecified: Secondary | ICD-10-CM

## 2019-08-21 DIAGNOSIS — Z9889 Other specified postprocedural states: Secondary | ICD-10-CM

## 2019-08-21 DIAGNOSIS — E739 Lactose intolerance, unspecified: Secondary | ICD-10-CM | POA: Diagnosis not present

## 2019-08-21 DIAGNOSIS — E1110 Type 2 diabetes mellitus with ketoacidosis without coma: Secondary | ICD-10-CM | POA: Diagnosis not present

## 2019-08-21 DIAGNOSIS — I120 Hypertensive chronic kidney disease with stage 5 chronic kidney disease or end stage renal disease: Secondary | ICD-10-CM | POA: Diagnosis not present

## 2019-08-21 DIAGNOSIS — R82998 Other abnormal findings in urine: Secondary | ICD-10-CM | POA: Diagnosis not present

## 2019-08-21 DIAGNOSIS — I959 Hypotension, unspecified: Secondary | ICD-10-CM

## 2019-08-21 LAB — BASIC METABOLIC PANEL
Anion gap: 28 — ABNORMAL HIGH (ref 5–15)
BUN: 137 mg/dL — ABNORMAL HIGH (ref 6–20)
CO2: 9 mmol/L — ABNORMAL LOW (ref 22–32)
Calcium: 8.1 mg/dL — ABNORMAL LOW (ref 8.9–10.3)
Chloride: 100 mmol/L (ref 98–111)
Creatinine, Ser: 24.74 mg/dL — ABNORMAL HIGH (ref 0.61–1.24)
GFR calc Af Amer: 2 mL/min — ABNORMAL LOW (ref >60)
GFR calc non Af Amer: 2 mL/min — ABNORMAL LOW (ref >60)
Glucose, Bld: 142 mg/dL — ABNORMAL HIGH (ref 70–99)
Potassium: 5.9 mmol/L — ABNORMAL HIGH (ref 3.5–5.1)
Sodium: 137 mmol/L (ref 135–145)

## 2019-08-21 LAB — CBC
HCT: 29.2 % — ABNORMAL LOW (ref 39.0–52.0)
Hemoglobin: 9.2 g/dL — ABNORMAL LOW (ref 13.0–17.0)
MCH: 28.1 pg (ref 26.0–34.0)
MCHC: 31.5 g/dL (ref 30.0–36.0)
MCV: 89.3 fL (ref 80.0–100.0)
Platelets: 153 10*3/uL (ref 150–400)
RBC: 3.27 MIL/uL — ABNORMAL LOW (ref 4.22–5.81)
RDW: 19.6 % — ABNORMAL HIGH (ref 11.5–15.5)
WBC: 10.7 10*3/uL — ABNORMAL HIGH (ref 4.0–10.5)
nRBC: 0 % (ref 0.0–0.2)

## 2019-08-21 NOTE — Telephone Encounter (Signed)
Patient is on his way to seek medical help right now!

## 2019-08-21 NOTE — Telephone Encounter (Signed)
If he is having trouble with breathing would recommend he be evaluated at the urgent care or ED at this time.

## 2019-08-21 NOTE — ED Triage Notes (Signed)
Pt arrives POV for eval of worsening SOB x 1 week. Pt reports he has missed his last five dialysis treatments of peritoneal dialysis because he wants to go back to HD. Reports epigastric pain as well. Pt denies CP. Tachypneic in triage.

## 2019-08-21 NOTE — Telephone Encounter (Signed)
Jorge [name] Isiac called to let you know that his is having problems shortness of breath / labored  x 1 day. He also stated he has caught a cold from the air conditioner please advise?

## 2019-08-22 ENCOUNTER — Emergency Department (HOSPITAL_COMMUNITY): Payer: Medicare Other

## 2019-08-22 DIAGNOSIS — E1143 Type 2 diabetes mellitus with diabetic autonomic (poly)neuropathy: Secondary | ICD-10-CM | POA: Diagnosis not present

## 2019-08-22 DIAGNOSIS — R066 Hiccough: Secondary | ICD-10-CM | POA: Diagnosis present

## 2019-08-22 DIAGNOSIS — E875 Hyperkalemia: Secondary | ICD-10-CM | POA: Diagnosis present

## 2019-08-22 DIAGNOSIS — E1110 Type 2 diabetes mellitus with ketoacidosis without coma: Secondary | ICD-10-CM

## 2019-08-22 DIAGNOSIS — E1122 Type 2 diabetes mellitus with diabetic chronic kidney disease: Secondary | ICD-10-CM | POA: Diagnosis not present

## 2019-08-22 DIAGNOSIS — K3184 Gastroparesis: Secondary | ICD-10-CM

## 2019-08-22 DIAGNOSIS — E049 Nontoxic goiter, unspecified: Secondary | ICD-10-CM | POA: Diagnosis present

## 2019-08-22 DIAGNOSIS — M898X9 Other specified disorders of bone, unspecified site: Secondary | ICD-10-CM | POA: Diagnosis present

## 2019-08-22 DIAGNOSIS — N186 End stage renal disease: Secondary | ICD-10-CM | POA: Diagnosis not present

## 2019-08-22 DIAGNOSIS — I952 Hypotension due to drugs: Secondary | ICD-10-CM | POA: Diagnosis not present

## 2019-08-22 DIAGNOSIS — E869 Volume depletion, unspecified: Secondary | ICD-10-CM | POA: Diagnosis not present

## 2019-08-22 DIAGNOSIS — I70208 Unspecified atherosclerosis of native arteries of extremities, other extremity: Secondary | ICD-10-CM | POA: Diagnosis present

## 2019-08-22 DIAGNOSIS — D509 Iron deficiency anemia, unspecified: Secondary | ICD-10-CM | POA: Diagnosis not present

## 2019-08-22 DIAGNOSIS — N250 Renal osteodystrophy: Secondary | ICD-10-CM | POA: Diagnosis not present

## 2019-08-22 DIAGNOSIS — Z992 Dependence on renal dialysis: Secondary | ICD-10-CM | POA: Diagnosis not present

## 2019-08-22 DIAGNOSIS — Z9114 Patient's other noncompliance with medication regimen: Secondary | ICD-10-CM | POA: Diagnosis not present

## 2019-08-22 DIAGNOSIS — E43 Unspecified severe protein-calorie malnutrition: Secondary | ICD-10-CM | POA: Diagnosis not present

## 2019-08-22 DIAGNOSIS — E739 Lactose intolerance, unspecified: Secondary | ICD-10-CM | POA: Diagnosis present

## 2019-08-22 DIAGNOSIS — N185 Chronic kidney disease, stage 5: Secondary | ICD-10-CM | POA: Diagnosis not present

## 2019-08-22 DIAGNOSIS — Z20822 Contact with and (suspected) exposure to covid-19: Secondary | ICD-10-CM | POA: Diagnosis present

## 2019-08-22 DIAGNOSIS — T85611A Breakdown (mechanical) of intraperitoneal dialysis catheter, initial encounter: Secondary | ICD-10-CM | POA: Diagnosis not present

## 2019-08-22 DIAGNOSIS — I160 Hypertensive urgency: Secondary | ICD-10-CM | POA: Diagnosis not present

## 2019-08-22 DIAGNOSIS — N2581 Secondary hyperparathyroidism of renal origin: Secondary | ICD-10-CM | POA: Diagnosis not present

## 2019-08-22 DIAGNOSIS — Z6822 Body mass index (BMI) 22.0-22.9, adult: Secondary | ICD-10-CM | POA: Diagnosis not present

## 2019-08-22 DIAGNOSIS — R0602 Shortness of breath: Secondary | ICD-10-CM | POA: Diagnosis not present

## 2019-08-22 DIAGNOSIS — E11649 Type 2 diabetes mellitus with hypoglycemia without coma: Secondary | ICD-10-CM | POA: Diagnosis not present

## 2019-08-22 DIAGNOSIS — R05 Cough: Secondary | ICD-10-CM | POA: Diagnosis not present

## 2019-08-22 DIAGNOSIS — E1151 Type 2 diabetes mellitus with diabetic peripheral angiopathy without gangrene: Secondary | ICD-10-CM | POA: Diagnosis present

## 2019-08-22 DIAGNOSIS — D631 Anemia in chronic kidney disease: Secondary | ICD-10-CM | POA: Diagnosis not present

## 2019-08-22 DIAGNOSIS — I120 Hypertensive chronic kidney disease with stage 5 chronic kidney disease or end stage renal disease: Secondary | ICD-10-CM | POA: Diagnosis not present

## 2019-08-22 DIAGNOSIS — D688 Other specified coagulation defects: Secondary | ICD-10-CM | POA: Diagnosis not present

## 2019-08-22 DIAGNOSIS — J9811 Atelectasis: Secondary | ICD-10-CM | POA: Diagnosis not present

## 2019-08-22 DIAGNOSIS — Z9115 Patient's noncompliance with renal dialysis: Secondary | ICD-10-CM | POA: Diagnosis not present

## 2019-08-22 HISTORY — DX: Hypertensive urgency: I16.0

## 2019-08-22 HISTORY — DX: Type 2 diabetes mellitus with ketoacidosis without coma: E11.10

## 2019-08-22 LAB — CBC
HCT: 29.1 % — ABNORMAL LOW (ref 39.0–52.0)
Hemoglobin: 9.3 g/dL — ABNORMAL LOW (ref 13.0–17.0)
MCH: 27.9 pg (ref 26.0–34.0)
MCHC: 32 g/dL (ref 30.0–36.0)
MCV: 87.4 fL (ref 80.0–100.0)
Platelets: 146 10*3/uL — ABNORMAL LOW (ref 150–400)
RBC: 3.33 MIL/uL — ABNORMAL LOW (ref 4.22–5.81)
RDW: 19.5 % — ABNORMAL HIGH (ref 11.5–15.5)
WBC: 15.2 10*3/uL — ABNORMAL HIGH (ref 4.0–10.5)
nRBC: 0 % (ref 0.0–0.2)

## 2019-08-22 LAB — BASIC METABOLIC PANEL
Anion gap: 31 — ABNORMAL HIGH (ref 5–15)
Anion gap: 27 — ABNORMAL HIGH (ref 5–15)
Anion gap: 30 — ABNORMAL HIGH (ref 5–15)
BUN: 143 mg/dL — ABNORMAL HIGH (ref 6–20)
BUN: 146 mg/dL — ABNORMAL HIGH (ref 6–20)
BUN: 143 mg/dL — ABNORMAL HIGH (ref 6–20)
CO2: 12 mmol/L — ABNORMAL LOW (ref 22–32)
CO2: 13 mmol/L — ABNORMAL LOW (ref 22–32)
CO2: 14 mmol/L — ABNORMAL LOW (ref 22–32)
Calcium: 8.4 mg/dL — ABNORMAL LOW (ref 8.9–10.3)
Calcium: 7.9 mg/dL — ABNORMAL LOW (ref 8.9–10.3)
Calcium: 8.1 mg/dL — ABNORMAL LOW (ref 8.9–10.3)
Chloride: 97 mmol/L — ABNORMAL LOW (ref 98–111)
Chloride: 99 mmol/L (ref 98–111)
Chloride: 98 mmol/L (ref 98–111)
Creatinine, Ser: 26.17 mg/dL — ABNORMAL HIGH (ref 0.61–1.24)
Creatinine, Ser: 27.12 mg/dL — ABNORMAL HIGH (ref 0.61–1.24)
Creatinine, Ser: 26.69 mg/dL — ABNORMAL HIGH (ref 0.61–1.24)
GFR calc Af Amer: 2 mL/min — ABNORMAL LOW (ref >60)
GFR calc Af Amer: 2 mL/min — ABNORMAL LOW (ref >60)
GFR calc Af Amer: 2 mL/min — ABNORMAL LOW (ref >60)
GFR calc non Af Amer: 2 mL/min — ABNORMAL LOW (ref >60)
GFR calc non Af Amer: 2 mL/min — ABNORMAL LOW (ref >60)
GFR calc non Af Amer: 2 mL/min — ABNORMAL LOW (ref >60)
Glucose, Bld: 164 mg/dL — ABNORMAL HIGH (ref 70–99)
Glucose, Bld: 146 mg/dL — ABNORMAL HIGH (ref 70–99)
Glucose, Bld: 89 mg/dL (ref 70–99)
Potassium: 5.2 mmol/L — ABNORMAL HIGH (ref 3.5–5.1)
Potassium: 5.3 mmol/L — ABNORMAL HIGH (ref 3.5–5.1)
Potassium: 4.8 mmol/L (ref 3.5–5.1)
Sodium: 140 mmol/L (ref 135–145)
Sodium: 139 mmol/L (ref 135–145)
Sodium: 142 mmol/L (ref 135–145)

## 2019-08-22 LAB — SARS CORONAVIRUS 2 BY RT PCR (HOSPITAL ORDER, PERFORMED IN ~~LOC~~ HOSPITAL LAB): SARS Coronavirus 2: NEGATIVE

## 2019-08-22 LAB — GLUCOSE, CAPILLARY
Glucose-Capillary: 85 mg/dL (ref 70–99)
Glucose-Capillary: 23 mg/dL — CL (ref 70–99)
Glucose-Capillary: 47 mg/dL — ABNORMAL LOW (ref 70–99)
Glucose-Capillary: 172 mg/dL — ABNORMAL HIGH (ref 70–99)
Glucose-Capillary: 251 mg/dL — ABNORMAL HIGH (ref 70–99)

## 2019-08-22 LAB — CBG MONITORING, ED
Glucose-Capillary: 176 mg/dL — ABNORMAL HIGH (ref 70–99)
Glucose-Capillary: 146 mg/dL — ABNORMAL HIGH (ref 70–99)
Glucose-Capillary: 137 mg/dL — ABNORMAL HIGH (ref 70–99)
Glucose-Capillary: 119 mg/dL — ABNORMAL HIGH (ref 70–99)
Glucose-Capillary: 139 mg/dL — ABNORMAL HIGH (ref 70–99)

## 2019-08-22 LAB — URINALYSIS, ROUTINE W REFLEX MICROSCOPIC
Bacteria, UA: NONE SEEN
Bilirubin Urine: NEGATIVE
Glucose, UA: >=500 mg/dL — AB
Ketones, ur: 5 mg/dL — AB
Nitrite: NEGATIVE
Protein, ur: 100 mg/dL — AB
Specific Gravity, Urine: 1.01 (ref 1.005–1.030)
pH: 5 (ref 5.0–8.0)

## 2019-08-22 LAB — HEMOGLOBIN A1C
Hgb A1c MFr Bld: 7.4 % — ABNORMAL HIGH (ref 4.8–5.6)
Mean Plasma Glucose: 165.68 mg/dL

## 2019-08-22 LAB — BETA-HYDROXYBUTYRIC ACID
Beta-Hydroxybutyric Acid: 2.13 mmol/L — ABNORMAL HIGH (ref 0.05–0.27)
Beta-Hydroxybutyric Acid: 0.25 mmol/L (ref 0.05–0.27)

## 2019-08-22 IMAGING — DX DG CHEST 1V PORT
1 series · 1 of 1 positions shown · non-contrast
Comparison: [DATE]

CLINICAL DATA: Cough and shortness of breath for 1 week

EXAM:
PORTABLE CHEST 1 VIEW

[chest]
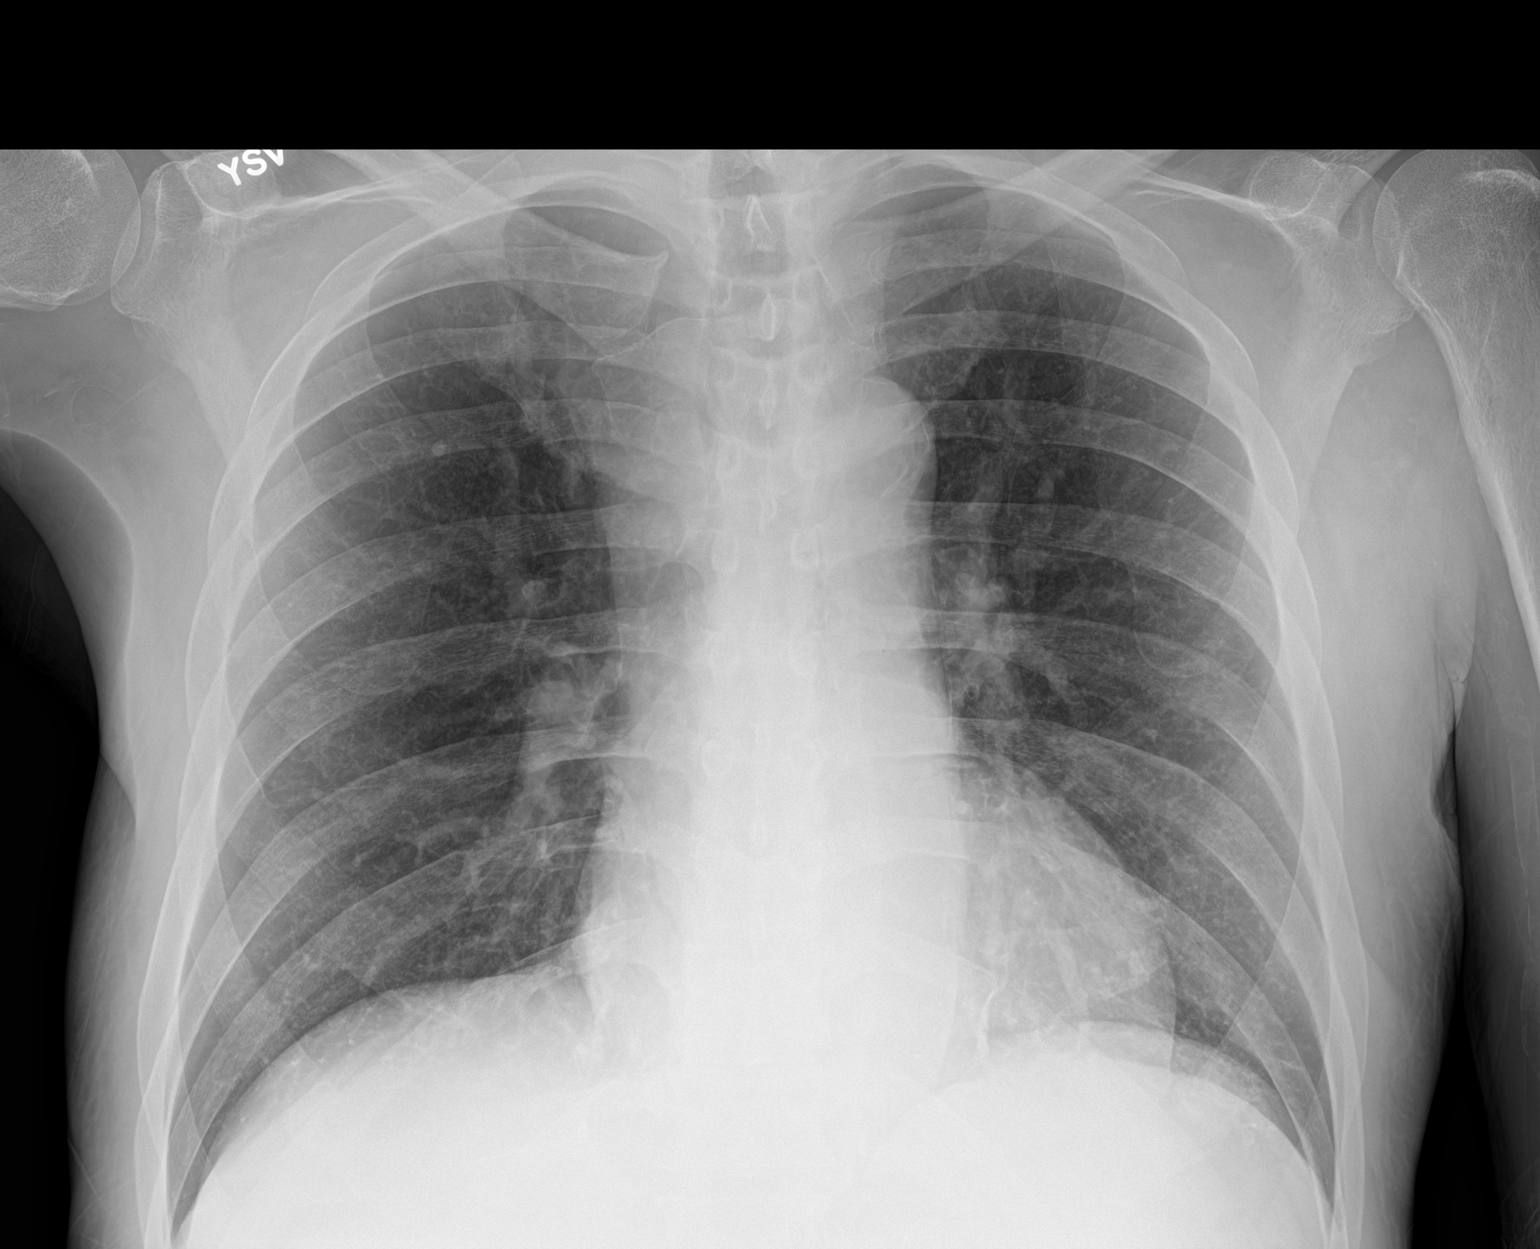

[1 of 1 positions shown; findings below may reference images not displayed]

FINDINGS: The heart size and mediastinal contours are within normal limits.
Both lungs are clear. The visualized skeletal structures are
unremarkable.
IMPRESSION: No acute abnormality noted.

## 2019-08-22 MED ORDER — RENA-VITE PO TABS
1.0000 | ORAL_TABLET | Freq: Every day | ORAL | Status: DC
  Administered 2019-08-22 – 2019-08-29 (×6): 1 via ORAL
  Filled 2019-08-22 (×7): qty 1

## 2019-08-22 MED ORDER — ATORVASTATIN CALCIUM 10 MG PO TABS
20.0000 mg | ORAL_TABLET | Freq: Every day | ORAL | Status: DC
  Administered 2019-08-22 – 2019-08-29 (×6): 20 mg via ORAL
  Filled 2019-08-22 (×6): qty 2

## 2019-08-22 MED ORDER — ONDANSETRON 4 MG PO TBDP
4.0000 mg | ORAL_TABLET | Freq: Once | ORAL | Status: AC
  Administered 2019-08-22: 4 mg via ORAL
  Filled 2019-08-22: qty 1

## 2019-08-22 MED ORDER — ASPIRIN EC 81 MG PO TBEC
81.0000 mg | DELAYED_RELEASE_TABLET | Freq: Every day | ORAL | Status: DC
  Administered 2019-08-22 – 2019-08-29 (×6): 81 mg via ORAL
  Filled 2019-08-22 (×6): qty 1

## 2019-08-22 MED ORDER — SODIUM ZIRCONIUM CYCLOSILICATE 10 G PO PACK: 10.0000 g | PACK | Freq: Every day | ORAL | Status: AC

## 2019-08-22 MED ORDER — HEPARIN 1000 UNIT/ML FOR PERITONEAL DIALYSIS
500.0000 [IU] | INTRAMUSCULAR | Status: DC | PRN
  Filled 2019-08-22 (×3): qty 0.5

## 2019-08-22 MED ORDER — HEPARIN SODIUM (PORCINE) 5000 UNIT/ML IJ SOLN
5000.0000 [IU] | Freq: Three times a day (TID) | INTRAMUSCULAR | Status: DC
  Administered 2019-08-22 – 2019-08-29 (×18): 5000 [IU] via SUBCUTANEOUS
  Filled 2019-08-22 (×18): qty 1

## 2019-08-22 MED ORDER — LACTATED RINGERS IV SOLN: INTRAVENOUS | Status: DC

## 2019-08-22 MED ORDER — DEXTROSE IN LACTATED RINGERS 5 % IV SOLN: INTRAVENOUS | Status: DC

## 2019-08-22 MED ORDER — DELFLEX-LC/2.5% DEXTROSE 394 MOSM/L IP SOLN: INTRAPERITONEAL | Status: DC

## 2019-08-22 MED ORDER — CALCITRIOL 0.25 MCG PO CAPS
0.5000 ug | ORAL_CAPSULE | ORAL | Status: DC
  Administered 2019-08-26: 0.5 ug via ORAL
  Filled 2019-08-22: qty 2

## 2019-08-22 MED ORDER — INSULIN REGULAR(HUMAN) IN NACL 100-0.9 UT/100ML-% IV SOLN
INTRAVENOUS | Status: DC
  Filled 2019-08-22: qty 100

## 2019-08-22 MED ORDER — DEXTROSE 50 % IV SOLN: 0.0000 mL | INTRAVENOUS | Status: DC | PRN

## 2019-08-22 MED ORDER — GABAPENTIN 300 MG PO CAPS
300.0000 mg | ORAL_CAPSULE | Freq: Every day | ORAL | Status: DC | PRN
  Administered 2019-08-24: 300 mg via ORAL
  Filled 2019-08-22: qty 1

## 2019-08-22 MED ORDER — DEXTROSE 50 % IV SOLN
1.0000 | Freq: Once | INTRAVENOUS | Status: AC | PRN
  Administered 2019-08-23: 35 mL via INTRAVENOUS
  Filled 2019-08-22: qty 50

## 2019-08-22 MED ORDER — GENTAMICIN SULFATE 0.1 % EX CREA
1.0000 "application " | TOPICAL_CREAM | Freq: Every day | CUTANEOUS | Status: DC
  Administered 2019-08-22 – 2019-08-29 (×4): 1 via TOPICAL
  Filled 2019-08-22 (×2): qty 15

## 2019-08-22 MED ORDER — HYDRALAZINE HCL 20 MG/ML IJ SOLN: 10.0000 mg | Freq: Four times a day (QID) | INTRAMUSCULAR | Status: DC | PRN

## 2019-08-22 MED ORDER — SEVELAMER CARBONATE 800 MG PO TABS
3200.0000 mg | ORAL_TABLET | Freq: Three times a day (TID) | ORAL | Status: DC
  Administered 2019-08-23 – 2019-08-29 (×10): 3200 mg via ORAL
  Filled 2019-08-22 (×11): qty 4

## 2019-08-22 MED ORDER — METOCLOPRAMIDE HCL 5 MG/ML IJ SOLN
10.0000 mg | Freq: Three times a day (TID) | INTRAMUSCULAR | Status: AC
  Administered 2019-08-22 – 2019-08-23 (×3): 10 mg via INTRAVENOUS
  Filled 2019-08-22 (×3): qty 2

## 2019-08-22 MED ORDER — PANTOPRAZOLE SODIUM 40 MG IV SOLR
40.0000 mg | INTRAVENOUS | Status: DC
  Administered 2019-08-22 – 2019-08-25 (×3): 40 mg via INTRAVENOUS
  Filled 2019-08-22 (×4): qty 40

## 2019-08-22 MED ORDER — DEXTROSE 50 % IV SOLN
0.0000 mL | INTRAVENOUS | Status: DC | PRN
  Administered 2019-08-22 – 2019-08-28 (×2): 50 mL via INTRAVENOUS
  Filled 2019-08-22: qty 50

## 2019-08-22 MED ORDER — INSULIN (MYXREDLIN) INFUSION FOR HYPERTRIGLYCERIDEMIA
7.0000 [IU]/h | INTRAVENOUS | Status: DC
  Administered 2019-08-22: 7 [IU]/h via INTRAVENOUS
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
## 2019-08-22 NOTE — ED Notes (Signed)
Ice chips given
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
## 2019-08-22 NOTE — ED Notes (Signed)
Pt doesn't make much urine, but aware we need sample

## 2019-08-22 NOTE — Progress Notes (Signed)
CRITICAL VALUE ALERT  Critical Value: blood sugar 23   Date & Time Notied: 08/22/19' 2006  Provider Notified: Dr. Ninetta Lights Orders Received/Actions taken: stopped insulin drip.

## 2019-08-23 LAB — GLUCOSE, CAPILLARY
Glucose-Capillary: 115 mg/dL — ABNORMAL HIGH (ref 70–99)
Glucose-Capillary: 123 mg/dL — ABNORMAL HIGH (ref 70–99)
Glucose-Capillary: 125 mg/dL — ABNORMAL HIGH (ref 70–99)
Glucose-Capillary: 150 mg/dL — ABNORMAL HIGH (ref 70–99)
Glucose-Capillary: 159 mg/dL — ABNORMAL HIGH (ref 70–99)
Glucose-Capillary: 170 mg/dL — ABNORMAL HIGH (ref 70–99)
Glucose-Capillary: 172 mg/dL — ABNORMAL HIGH (ref 70–99)
Glucose-Capillary: 180 mg/dL — ABNORMAL HIGH (ref 70–99)
Glucose-Capillary: 183 mg/dL — ABNORMAL HIGH (ref 70–99)
Glucose-Capillary: 215 mg/dL — ABNORMAL HIGH (ref 70–99)
Glucose-Capillary: 64 mg/dL — ABNORMAL LOW (ref 70–99)
Glucose-Capillary: 80 mg/dL (ref 70–99)

## 2019-08-23 LAB — BASIC METABOLIC PANEL
Anion gap: 27 — ABNORMAL HIGH (ref 5–15)
Anion gap: 25 — ABNORMAL HIGH (ref 5–15)
Anion gap: 22 — ABNORMAL HIGH (ref 5–15)
Anion gap: 21 — ABNORMAL HIGH (ref 5–15)
BUN: 130 mg/dL — ABNORMAL HIGH (ref 6–20)
BUN: 123 mg/dL — ABNORMAL HIGH (ref 6–20)
BUN: 116 mg/dL — ABNORMAL HIGH (ref 6–20)
BUN: 118 mg/dL — ABNORMAL HIGH (ref 6–20)
CO2: 12 mmol/L — ABNORMAL LOW (ref 22–32)
CO2: 17 mmol/L — ABNORMAL LOW (ref 22–32)
CO2: 16 mmol/L — ABNORMAL LOW (ref 22–32)
CO2: 17 mmol/L — ABNORMAL LOW (ref 22–32)
Calcium: 7.7 mg/dL — ABNORMAL LOW (ref 8.9–10.3)
Calcium: 7.6 mg/dL — ABNORMAL LOW (ref 8.9–10.3)
Calcium: 7.3 mg/dL — ABNORMAL LOW (ref 8.9–10.3)
Calcium: 7.1 mg/dL — ABNORMAL LOW (ref 8.9–10.3)
Chloride: 98 mmol/L (ref 98–111)
Chloride: 98 mmol/L (ref 98–111)
Chloride: 98 mmol/L (ref 98–111)
Chloride: 97 mmol/L — ABNORMAL LOW (ref 98–111)
Creatinine, Ser: 23.74 mg/dL — ABNORMAL HIGH (ref 0.61–1.24)
Creatinine, Ser: 22.81 mg/dL — ABNORMAL HIGH (ref 0.61–1.24)
Creatinine, Ser: 21.9 mg/dL — ABNORMAL HIGH (ref 0.61–1.24)
Creatinine, Ser: 22.06 mg/dL — ABNORMAL HIGH (ref 0.61–1.24)
GFR calc Af Amer: 2 mL/min — ABNORMAL LOW (ref >60)
GFR calc Af Amer: 2 mL/min — ABNORMAL LOW (ref >60)
GFR calc Af Amer: 2 mL/min — ABNORMAL LOW (ref >60)
GFR calc Af Amer: 2 mL/min — ABNORMAL LOW (ref >60)
GFR calc non Af Amer: 2 mL/min — ABNORMAL LOW (ref >60)
GFR calc non Af Amer: 2 mL/min — ABNORMAL LOW (ref >60)
GFR calc non Af Amer: 2 mL/min — ABNORMAL LOW (ref >60)
GFR calc non Af Amer: 2 mL/min — ABNORMAL LOW (ref >60)
Glucose, Bld: 192 mg/dL — ABNORMAL HIGH (ref 70–99)
Glucose, Bld: 112 mg/dL — ABNORMAL HIGH (ref 70–99)
Glucose, Bld: 210 mg/dL — ABNORMAL HIGH (ref 70–99)
Glucose, Bld: 142 mg/dL — ABNORMAL HIGH (ref 70–99)
Potassium: 4.4 mmol/L (ref 3.5–5.1)
Potassium: 4.3 mmol/L (ref 3.5–5.1)
Potassium: 4.4 mmol/L (ref 3.5–5.1)
Potassium: 4.8 mmol/L (ref 3.5–5.1)
Sodium: 137 mmol/L (ref 135–145)
Sodium: 140 mmol/L (ref 135–145)
Sodium: 136 mmol/L (ref 135–145)
Sodium: 135 mmol/L (ref 135–145)

## 2019-08-23 LAB — BETA-HYDROXYBUTYRIC ACID: Beta-Hydroxybutyric Acid: 0.07 mmol/L (ref 0.05–0.27)

## 2019-08-23 MED ORDER — HEPARIN 1000 UNIT/ML FOR PERITONEAL DIALYSIS
INTRAPERITONEAL | Status: DC | PRN
  Filled 2019-08-23 (×6): qty 5000

## 2019-08-23 MED ORDER — DELFLEX-LC/1.5% DEXTROSE 344 MOSM/L IP SOLN: INTRAPERITONEAL | Status: DC

## 2019-08-23 MED ORDER — HEPARIN 1000 UNIT/ML FOR PERITONEAL DIALYSIS
INTRAPERITONEAL | Status: DC | PRN
  Filled 2019-08-23 (×3): qty 5000

## 2019-08-23 MED ORDER — INSULIN ASPART 100 UNIT/ML ~~LOC~~ SOLN
0.0000 [IU] | Freq: Three times a day (TID) | SUBCUTANEOUS | Status: DC
  Administered 2019-08-23: 2 [IU] via SUBCUTANEOUS
  Administered 2019-08-25: 1 [IU] via SUBCUTANEOUS
  Administered 2019-08-26: 2 [IU] via SUBCUTANEOUS

## 2019-08-23 MED ORDER — CHLORPROMAZINE HCL 25 MG PO TABS
25.0000 mg | ORAL_TABLET | Freq: Three times a day (TID) | ORAL | Status: DC
  Administered 2019-08-23 – 2019-08-25 (×5): 25 mg via ORAL
  Filled 2019-08-23 (×8): qty 1

## 2019-08-23 MED ORDER — BACLOFEN 5 MG HALF TABLET: 5.0000 mg | ORAL_TABLET | Freq: Two times a day (BID) | ORAL | Status: DC

## 2019-08-23 MED ORDER — CEFAZOLIN SODIUM-DEXTROSE 2-4 GM/100ML-% IV SOLN: 2.0000 g | INTRAVENOUS | Status: DC

## 2019-08-23 MED ORDER — SODIUM BICARBONATE 8.4 % IV SOLN
50.0000 meq | Freq: Once | INTRAVENOUS | Status: AC
  Administered 2019-08-23: 50 meq via INTRAVENOUS
  Filled 2019-08-23: qty 50

## 2019-08-23 MED ORDER — INSULIN GLARGINE 100 UNIT/ML ~~LOC~~ SOLN
8.0000 [IU] | Freq: Every day | SUBCUTANEOUS | Status: DC
  Administered 2019-08-23 – 2019-08-25 (×3): 8 [IU] via SUBCUTANEOUS
  Filled 2019-08-23 (×6): qty 0.08

## 2019-08-23 MED ORDER — CHLORHEXIDINE GLUCONATE CLOTH 2 % EX PADS
6.0000 | MEDICATED_PAD | Freq: Every day | CUTANEOUS | Status: DC
  Administered 2019-08-25 – 2019-08-29 (×5): 6 via TOPICAL

## 2019-08-23 MED ORDER — INSULIN ASPART 100 UNIT/ML ~~LOC~~ SOLN
2.0000 [IU] | Freq: Three times a day (TID) | SUBCUTANEOUS | Status: DC
  Administered 2019-08-25 – 2019-08-29 (×5): 2 [IU] via SUBCUTANEOUS

## 2019-08-23 MED ORDER — CEFAZOLIN SODIUM-DEXTROSE 2-4 GM/100ML-% IV SOLN
2.0000 g | INTRAVENOUS | Status: AC
  Administered 2019-08-24: 2 g via INTRAVENOUS
  Filled 2019-08-23: qty 100

## 2019-08-23 MED ORDER — SODIUM CHLORIDE 0.9 % IV SOLN: 1.5000 g | INTRAVENOUS | Status: DC

## 2019-08-23 NOTE — Progress Notes (Signed)
Re paged Dr. Marlowe Sax about pt's blood sugar and him refusing insulin gtts. Pt stated his body cannot handle it anymore, bc when sugar is low he cannot control is bowel. Insulin has been on hold since last two hour. Notified MD sugar was 80, 64 and 123 after d50 iv administration. Per MD stop insulin gtts for now, recheck blood sugar every one hour. MD stated switch IVF to D5LR, and ordered amp of sodium bicarb. Will continue to monitor.

## 2019-08-23 NOTE — Progress Notes (Signed)
PROGRESS NOTE    Jorge [name]  JOA:[id number] DOB: 10/31/1970 DOA: 08/21/2019 PCP: Lauree Chandler, NP     Brief Narrative:  Jorge [name] is a 49 y.o. male with medical history significant for diabetes mellitus with complications of gastroparesis and end-stage renal disease on peritoneal dialysis, history of peripheral arterial disease status post bilateral BKA, anemia of chronic kidney disease and protein calorie malnutrition who presents to the ER for evaluation of feeling poorly for the last couple of days.  Patient has not had his peritoneal dialysis in days because he just did not feel well and thinks his last PD was may be 08/18/19.  He was having nausea, vomiting and epigastric discomfort and has not been eating or drinking due to this.  He has not taken his insulin in the last couple of days because he has not felt well.  He denies having any chest pain, diaphoresis or palpitations.  He has not had a bowel movement in a couple of days.  He denies having any fever or chills, no headache, no dizziness or lightheadedness.   New events last 24 hours / Subjective: Anion gap still elevated to 25.  He is hungry.  Lactated Ringer's was changed to D5 in lactated Ringer's.  We will start a renal diet.  He has had epigastric abdominal pain made worse by hiccuping.  He is requesting something for this.  Assessment & Plan:   Principal Problem:   DKA (diabetic ketoacidoses) ([name])  Glucose every 2 hours  BMP every 4 hours, will check daily once his anion gap closes  OK to eat if hungry; renal diet- SSI once gap closes  Cont D5 in LR for now, stop LR   Active Problems:   ESRD on peritoneal dialysis ([place])  Appreciate Nephro and Vasc surg  Canalization tomorrow as PD not successful  Monitor BMP  Calcitriol 0.5 mcg 3 times weekly  Renvela 3200 mg 3 times daily    Diabetes  Lipitor 20 mg daily  EpiPen 30 mg daily as needed    Hiccups  Thorazine 25 mg 3 times daily    Gastroparesis  due to DM ([name])    Hyperkalemia  Resolved    Hypertensive urgency  Hydralazine 10 mg q 6 hrs prn  DVT prophylaxis:  heparin injection 5,000 Units Start: 08/22/19 1400  Code Status: Full Family Communication: Self Disposition Plan:   Status is: Inpatient  Remains inpatient appropriate because:Inpatient level of care appropriate due to severity of illness   [name]: The patient is from: Home              Anticipated d/c is to: Home              Anticipated d/c date is: 2 days              Patient currently is not medically stable to d/c.  Consultants:   Nephrology  Vascular surgery  Procedures:   None currently  Antimicrobials:  Anti-infectives (From admission, onward)   Start     Dose/Rate Route Frequency Ordered Stop   08/24/19 0800  ceFAZolin (ANCEF) IVPB 2g/100 mL premix  Status:  Discontinued       Note to Pharmacy: Send with pt to OR   2 g 200 mL/hr over 30 Minutes Intravenous On call 08/23/19 0810 08/23/19 1005   08/24/19 0800  ceFAZolin (ANCEF) IVPB 2g/100 mL premix     Discontinue    Note to Pharmacy: Send with pt to OR  2 g 200 mL/hr over 30 Minutes Intravenous To Surgery 08/23/19 1005 08/25/19 0800   08/24/19 0600  cefUROXime (ZINACEF) 1.5 g in sodium chloride 0.9 % 100 mL IVPB  Status:  Discontinued        1.5 g 200 mL/hr over 30 Minutes Intravenous On call to O.R. 08/23/19 0919 08/23/19 1002       Objective: Vitals:   08/23/19 0628 08/23/19 0751 08/23/19 1122 08/23/19 1200  BP: (!) 166/94 (!) 161/92 (!) 183/98 (!) 142/97  Pulse: 88 87 87 88  Resp: 14 12 20  (!) 22  Temp: 97.7 F (36.5 C) 97.6 F (36.4 C) 98.1 F (36.7 C) 97.9 F (36.6 C)  TempSrc: Oral Oral Oral Oral  SpO2: 96% 98% 96% 95%  Weight:  72.2 kg    Height:        Intake/Output Summary (Last 24 hours) at 08/23/2019 1258 Last data filed at 08/23/2019 0416 Gross per 24 hour  Intake 1516.89 ml  Output --  Net 1516.89 ml   Filed Weights   08/21/19 1720 08/22/19 1802 08/23/19  0751  Weight: 72.6 kg 69.9 kg 72.2 kg    Examination:  General exam: Appears calm and comfortable  Respiratory system: Clear to auscultation. Respiratory effort normal. No respiratory distress. No conversational dyspnea.  Cardiovascular system: S1 & S2 heard, RRR.  No lower extremity edema Gastrointestinal system: Abdomen is nondistended, soft and nontender. Normal bowel sounds heard. Central nervous system: Alert and oriented. No focal neurological deficits. Speech clear.  Extremities: Bilateral BKA's noted Skin: No rashes, lesions or ulcers on exposed skin  Psychiatry: Judgement and insight appear normal. Mood & affect appropriate.   Data Reviewed: I have personally reviewed following labs and imaging studies  CBC: Recent Labs  Lab 08/21/19 1805 08/22/19 1200  WBC 10.7* 15.2*  HGB 9.2* 9.3*  HCT 29.2* 29.1*  MCV 89.3 87.4  PLT 153 643*   Basic Metabolic Panel: Recent Labs  Lab 08/22/19 1200 08/22/19 1441 08/22/19 1840 08/23/19 0041 08/23/19 0426  NA 140 139 142 137 140  K 5.2* 5.3* 4.8 4.4 4.3  CL 97* 99 98 98 98  CO2 12* 13* 14* 12* 17*  GLUCOSE 164* 146* 89 192* 112*  BUN 143* 146* 143* 130* 123*  CREATININE 26.17* 27.12* 26.69* 23.74* 22.81*  CALCIUM 8.4* 7.9* 8.1* 7.7* 7.6*   HbA1C: Recent Labs    08/22/19 1200  HGBA1C 7.4*   CBG: Recent Labs  Lab 08/23/19 0525 08/23/19 0627 08/23/19 0817 08/23/19 1121 08/23/19 1243  GLUCAP 115* 125* 159* 180* 183*    Recent Results (from the past 240 hour(s))  SARS Coronavirus 2 by RT PCR (hospital order, performed in [facility] hospital lab) Nasopharyngeal Nasopharyngeal Swab     Status: None   Collection Time: 08/22/19  7:40 AM   Specimen: Nasopharyngeal Swab  Result Value Ref Range Status   SARS Coronavirus 2 NEGATIVE NEGATIVE Final    Comment: (NOTE) SARS-CoV-2 target nucleic acids are NOT DETECTED.  The SARS-CoV-2 RNA is generally detectable in upper and lower respiratory specimens during the acute  phase of infection. The lowest concentration of SARS-CoV-2 viral copies this assay can detect is 250 copies / mL. A negative result does not preclude SARS-CoV-2 infection and should not be used as the sole basis for treatment or other patient management decisions.  A negative result may occur with improper specimen collection / handling, submission of specimen other than nasopharyngeal swab, presence of viral mutation(s) within the areas targeted by this  assay, and inadequate number of viral copies (<250 copies / mL). A negative result must be combined with clinical observations, patient history, and epidemiological information.  Fact Sheet for Patients:   StrictlyIdeas.no  Fact Sheet for Healthcare Providers: BankingDealers.co.za  This test is not yet approved or  cleared by the [place] FDA and has been authorized for detection and/or diagnosis of SARS-CoV-2 by FDA under an Emergency Use Authorization (EUA).  This EUA will remain in effect (meaning this test can be used) for the duration of the COVID-19 declaration under Section 564(b)(1) of the Act, 21 U.S.C. section 360bbb-3(b)(1), unless the authorization is terminated or revoked sooner.  Performed at [facility], [place] [address], [place], [place] [address]       Radiology Studies: DG Chest Portable 1 View  Result Date: 08/22/2019 CLINICAL DATA:  Cough and shortness of breath for 1 week EXAM: PORTABLE CHEST 1 VIEW COMPARISON:  02/11/2005 FINDINGS: The heart size and mediastinal contours are within normal limits. Both lungs are clear. The visualized skeletal structures are unremarkable. IMPRESSION: No acute abnormality noted. Electronically Signed   By: Inez Catalina M.D.   On: 08/22/2019 07:25      Scheduled Meds: . aspirin EC  81 mg Oral Daily  . atorvastatin  20 mg Oral Daily  . [START ON 08/24/2019] calcitRIOL  0.5 mcg Oral Once per day on Mon Wed Fri  .  Chlorhexidine Gluconate Cloth  6 each Topical Daily  . chlorproMAZINE  25 mg Oral TID  . gentamicin cream  1 application Topical Daily  . heparin  5,000 Units Subcutaneous Q8H  . insulin aspart  0-9 Units Subcutaneous TID WC  . insulin aspart  2 Units Subcutaneous TID WC  . insulin glargine  8 Units Subcutaneous QHS  . multivitamin  1 tablet Oral Daily  . pantoprazole (PROTONIX) IV  40 mg Intravenous Q24H  . sevelamer carbonate  3,200 mg Oral TID WC   Continuous Infusions: . [START ON 08/24/2019]  ceFAZolin (ANCEF) IV    . dextrose 5% lactated ringers 100 mL/hr at 08/23/19 0415  . dialysis solution 1.5% low-MG/low-CA    . insulin Stopped (08/23/19 0230)     LOS: 1 day      Time spent: 35 minutes   Shelda Pal, DO [facility] 08/23/2019, 12:58 PM   Available via Epic secure chat 7am-7pm After these hours, please refer to coverage provider listed on [email or web address]
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
## 2019-08-23 NOTE — Progress Notes (Signed)
Blood sugar check at 2028 was 47. Administered 50% dextrose as ordered. Blood sugar check after dextrose at 2105 was 172. Text paged Dr. Ninetta Lights. Insulin drip/endo tool reordered. Will continue to monitor.

## 2019-08-23 NOTE — Progress Notes (Signed)
Pt. PD tx interrupted due to fibrin tissue present blocking PD catheter drain. Dr. Jonnie Finner paged and notified orders to cancel tx and add heparin to bag per pharmacy dosing and resume PD tx. Pt made aware pt. Stable. Waiting preparation of PD fluid from pharmacy.

## 2019-08-23 NOTE — Progress Notes (Signed)
PD tx interrupted d/t blockage of pd catheter d/t fibrin tissue. This is 2nd attempt CCPD within 24hrs with no success of tx completion. Dr. Jonnie Finner made aware orders to stop PD tx at his time pt. Will be converted to Hemodialysis tomorrow. Pt. stable
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
## 2019-08-23 NOTE — Progress Notes (Signed)
Pt. Refusing Insulin gtt. Explained to pt. The reason for gtt but pt. Wants to talk to MD about the situation. MD text paged and awaiting callback.Will continue to monitor

## 2019-08-23 NOTE — Progress Notes (Addendum)
Checked pt's blood sugar during bedside report, BS was 23. I stopped insulin drip. Pt was ao x4, eating but drowsy and tired, profusely sweating. Pt ate his meal, was able to drink 2 apple juice. Dialysis nurse at bedside with ongoing peritoneal dialysis. Paged DR. Ninetta Lights, MD called back. Notified Insulin drip was stopped. Will recheck blood sugar. Will continue to monitor.

## 2019-08-23 NOTE — Progress Notes (Signed)
[facility] Progress Note  Subjective: pt got 3/ 7 PD exchanges overnight, not draining get w/ fibrin in the return bags. Pt no c/o.    Vitals:   08/22/19 1932 08/23/19 0023 08/23/19 0628 08/23/19 0751  BP: 138/86 121/76 (!) 166/94   Pulse: 91 88 88   Resp: 16 16 14 12   Temp: 97.8 F (36.6 C) 97.8 F (36.6 C) 97.7 F (36.5 C) 97.6 F (36.4 C)  TempSrc: Oral Oral Oral Oral  SpO2: 98% 97% 96%   Weight:    72.2 kg  Height:        Exam: Center: PD days per week, Nephrologist Dr. Joelyn Oms  EDW 68.5 (kg) (changed to 74kg?) Ca 2.5 (mEq/L) Mg 0.5 (mEq/L) Dextrose 1.5; 2.5; 4.25 %, # Exchanges 5, fil vol 1800 mL, Dwell Time 1 hrs 30 min, last fill vol 0 mL, Day Exchanges 0, Day Dwell Time 0 hrs 0 min   Assessment/Plan: 1. ESRD:  Has not completed PD in 4 days at home, here is azotemic w/ some symptoms of uremia including fatigue, nausea and hiccups, but otherwise stable w/o confusion or myoclonus. Doing some PD but not draining well, will try again today. Consulted [facility] for [facility] tomorrow (Monday) then will start him on HD tomorrow after access in. Will need perm access (AVF/ AVG) also prior to dc.  2.  Cough/SOB:  CXR negative, afebrile, COVID neg. WBC up 15k. Per primary team 3. Hyperkalemia: K+ 5.9 > 4.3 today w/ lokelma and some PD.  4.  Hypertension/volume: BP's okay. Not volume overloaded on exam or CXR.  5.  Anemia: Hgb 9.2. Appears to be due for mircera 277mcg on 8/16. No active bleeding reported. 6.  Metabolic bone disease:  Calcium low but likely close to goal corrected. Continue calcitriol and renvela.  7.  Nutrition:  Alb low, recommend protein supplement once tolerating PO     Rob Ghazi Rumpf 08/23/2019, 8:02 AM   Recent Labs  Lab 08/21/19 1805 08/21/19 1805 08/22/19 1200 08/22/19 1441 08/23/19 0041 08/23/19 0426  K 5.9*   < > 5.2*   < > 4.4 4.3  BUN 137*   < > 143*   < > 130* 123*  CREATININE 24.74*   < > 26.17*   < > 23.74* 22.81*  CALCIUM 8.1*   < >  8.4*   < > 7.7* 7.6*  HGB 9.2*  --  9.3*  --   --   --    < > = values in this interval not displayed.   Inpatient medications: . aspirin EC  81 mg Oral Daily  . atorvastatin  20 mg Oral Daily  . [START ON 08/24/2019] calcitRIOL  0.5 mcg Oral Once per day on Mon Wed Fri  . gentamicin cream  1 application Topical Daily  . heparin  5,000 Units Subcutaneous Q8H  . multivitamin  1 tablet Oral Daily  . pantoprazole (PROTONIX) IV  40 mg Intravenous Q24H  . sevelamer carbonate  3,200 mg Oral TID WC  . sodium zirconium cyclosilicate  10 g Oral Daily   . dextrose 5% lactated ringers 100 mL/hr at 08/23/19 0415  . dialysis solution 1.5% low-MG/low-CA    . insulin Stopped (08/23/19 0230)  . lactated ringers Stopped (08/23/19 0416)   dextrose, dextrose, gabapentin, dianeal solution for CAPD/CCPD with heparin, hydrALAZINE

## 2019-08-23 NOTE — Progress Notes (Addendum)
Called Dialysis RN multiple times as pt peritoneal dialysis machine keeps saying pt side blockage. Cathter not kinked, all clamps checked . Pt kept saying there is fibrin in the catheter and just needs to be flushed. No dialysis RN available before 6am to fix it. Called toll free number for assistance talked talked to jared, tried some option but did not fix the issue. Dialysis nurse called back, he said somebody could come after 6. Will continue to monitor.

## 2019-08-23 NOTE — Progress Notes (Signed)
[facility] Progress Note  Subjective: pt got 3/ 7 PD exchanges overnight, not draining get w/ fibrin in the return .  No new c/o's today.   Vitals:   08/23/19 0023 08/23/19 0628 08/23/19 0751 08/23/19 1122  BP: 121/76 (!) 166/94 (!) 161/92 (!) 183/98  Pulse: 88 88 87 87  Resp: 16 14 12 20   Temp: 97.8 F (36.6 C) 97.7 F (36.5 C) 97.6 F (36.4 C) 98.1 F (36.7 C)  TempSrc: Oral Oral Oral Oral  SpO2: 97% 96% 98% 96%  Weight:   72.2 kg   Height:        Exam: Center: PD days per week, Nephrologist Dr. Joelyn Oms  EDW 68.5 (kg) (changed to 74kg?) Ca 2.5 (mEq/L) Mg 0.5 (mEq/L) Dextrose 1.5; 2.5; 4.25 %, # Exchanges 5, fil vol 1800 mL, Dwell Time 1 hrs 30 min, last fill vol 0 mL, Day Exchanges 0, Day Dwell Time 0 hrs 0 min   Assessment/Plan: 1. ESRD:  Has not completed PD last 4 days at home, here is azotemic w/ some symptoms of uremia including fatigue, nausea and hiccups, but otherwise stable w/o confusion or myoclonus. Doing some PD but not draining well, will try again today. Consulted [facility] for [facility] tomorrow (Monday) then will start him on HD tomorrow after access in. Will need perm access (AVF/ AVG) also prior to dc. Has d/w pt who states he understands and wishes to proceed.  2.  Cough/SOB:  CXR negative, afebrile, COVID neg. WBC up 15k. Per primary team 3. Hyperkalemia: K+ 5.9 > 4.3 today w/ lokelma and some PD.  4.  Hypertension/volume: BP's okay. Not volume overloaded on exam or CXR.  5.  Anemia: Hgb 9.2. Appears to be due for mircera 271mcg on 8/16. No active bleeding reported. 6.  Metabolic bone disease:  Calcium low but likely close to goal corrected. Continue calcitriol and renvela.  7.  Nutrition:  Alb low, recommend protein supplement once tolerating PO     Rob Mariza Bourget 08/23/2019, 11:58 AM   Recent Labs  Lab 08/21/19 1805 08/21/19 1805 08/22/19 1200 08/22/19 1441 08/23/19 0041 08/23/19 0426  K 5.9*   < > 5.2*   < > 4.4 4.3  BUN 137*   < > 143*   <  > 130* 123*  CREATININE 24.74*   < > 26.17*   < > 23.74* 22.81*  CALCIUM 8.1*   < > 8.4*   < > 7.7* 7.6*  HGB 9.2*  --  9.3*  --   --   --    < > = values in this interval not displayed.   Inpatient medications: . aspirin EC  81 mg Oral Daily  . atorvastatin  20 mg Oral Daily  . [START ON 08/24/2019] calcitRIOL  0.5 mcg Oral Once per day on Mon Wed Fri  . chlorproMAZINE  25 mg Oral TID  . gentamicin cream  1 application Topical Daily  . heparin  5,000 Units Subcutaneous Q8H  . multivitamin  1 tablet Oral Daily  . pantoprazole (PROTONIX) IV  40 mg Intravenous Q24H  . sevelamer carbonate  3,200 mg Oral TID WC   . [START ON 08/24/2019]  ceFAZolin (ANCEF) IV    . dextrose 5% lactated ringers 100 mL/hr at 08/23/19 0415  . dialysis solution 1.5% low-MG/low-CA    . insulin Stopped (08/23/19 0230)  . lactated ringers Stopped (08/23/19 0416)   dextrose, dextrose, gabapentin, dianeal solution for CAPD/CCPD with heparin, hydrALAZINE

## 2019-08-24 ENCOUNTER — Inpatient Hospital Stay (HOSPITAL_COMMUNITY): Payer: Medicare Other | Admitting: Anesthesiology

## 2019-08-24 ENCOUNTER — Encounter (HOSPITAL_COMMUNITY)
Admission: EM | Disposition: A | Discharge status: Home / Self Care | Payer: Self-pay | Source: Home / Self Care | Attending: Internal Medicine

## 2019-08-24 ENCOUNTER — Inpatient Hospital Stay (HOSPITAL_COMMUNITY): Payer: Medicare Other

## 2019-08-24 ENCOUNTER — Ambulatory Visit: Payer: Medicare Other

## 2019-08-24 ENCOUNTER — Encounter (HOSPITAL_COMMUNITY): Payer: Self-pay | Admitting: Internal Medicine

## 2019-08-24 DIAGNOSIS — I952 Hypotension due to drugs: Secondary | ICD-10-CM | POA: Diagnosis not present

## 2019-08-24 DIAGNOSIS — I959 Hypotension, unspecified: Secondary | ICD-10-CM

## 2019-08-24 DIAGNOSIS — Z992 Dependence on renal dialysis: Secondary | ICD-10-CM | POA: Diagnosis not present

## 2019-08-24 DIAGNOSIS — N186 End stage renal disease: Secondary | ICD-10-CM | POA: Diagnosis not present

## 2019-08-24 DIAGNOSIS — N185 Chronic kidney disease, stage 5: Secondary | ICD-10-CM

## 2019-08-24 HISTORY — PX: INSERTION OF DIALYSIS CATHETER: SHX1324

## 2019-08-24 LAB — CBC
HCT: 21.7 % — ABNORMAL LOW (ref 39.0–52.0)
Hemoglobin: 7 g/dL — ABNORMAL LOW (ref 13.0–17.0)
MCH: 27.8 pg (ref 26.0–34.0)
MCHC: 32.3 g/dL (ref 30.0–36.0)
MCV: 86.1 fL (ref 80.0–100.0)
Platelets: 123 10*3/uL — ABNORMAL LOW (ref 150–400)
RBC: 2.52 MIL/uL — ABNORMAL LOW (ref 4.22–5.81)
RDW: 19.3 % — ABNORMAL HIGH (ref 11.5–15.5)
WBC: 9.1 10*3/uL (ref 4.0–10.5)
nRBC: 0 % (ref 0.0–0.2)

## 2019-08-24 LAB — RENAL FUNCTION PANEL
Albumin: 2.2 g/dL — ABNORMAL LOW (ref 3.5–5.0)
Anion gap: 21 — ABNORMAL HIGH (ref 5–15)
BUN: 116 mg/dL — ABNORMAL HIGH (ref 6–20)
CO2: 17 mmol/L — ABNORMAL LOW (ref 22–32)
Calcium: 7.1 mg/dL — ABNORMAL LOW (ref 8.9–10.3)
Chloride: 97 mmol/L — ABNORMAL LOW (ref 98–111)
Creatinine, Ser: 22.22 mg/dL — ABNORMAL HIGH (ref 0.61–1.24)
GFR calc Af Amer: 2 mL/min — ABNORMAL LOW (ref >60)
GFR calc non Af Amer: 2 mL/min — ABNORMAL LOW (ref >60)
Glucose, Bld: 113 mg/dL — ABNORMAL HIGH (ref 70–99)
Phosphorus: 11.3 mg/dL — ABNORMAL HIGH (ref 2.5–4.6)
Potassium: 4.5 mmol/L (ref 3.5–5.1)
Sodium: 135 mmol/L (ref 135–145)

## 2019-08-24 LAB — PREPARE RBC (CROSSMATCH)

## 2019-08-24 LAB — GLUCOSE, CAPILLARY
Glucose-Capillary: 93 mg/dL (ref 70–99)
Glucose-Capillary: 86 mg/dL (ref 70–99)
Glucose-Capillary: 97 mg/dL (ref 70–99)
Glucose-Capillary: 123 mg/dL — ABNORMAL HIGH (ref 70–99)
Glucose-Capillary: 161 mg/dL — ABNORMAL HIGH (ref 70–99)

## 2019-08-24 LAB — SURGICAL PCR SCREEN
MRSA, PCR: NEGATIVE
Staphylococcus aureus: NEGATIVE

## 2019-08-24 IMAGING — DX DG CHEST 1V PORT SAME DAY
1 series · 1 of 1 positions shown · non-contrast
Comparison: [DATE]

CLINICAL DATA: Status post dialysis catheter placement.

EXAM:
PORTABLE CHEST 1 VIEW

[chest ap]
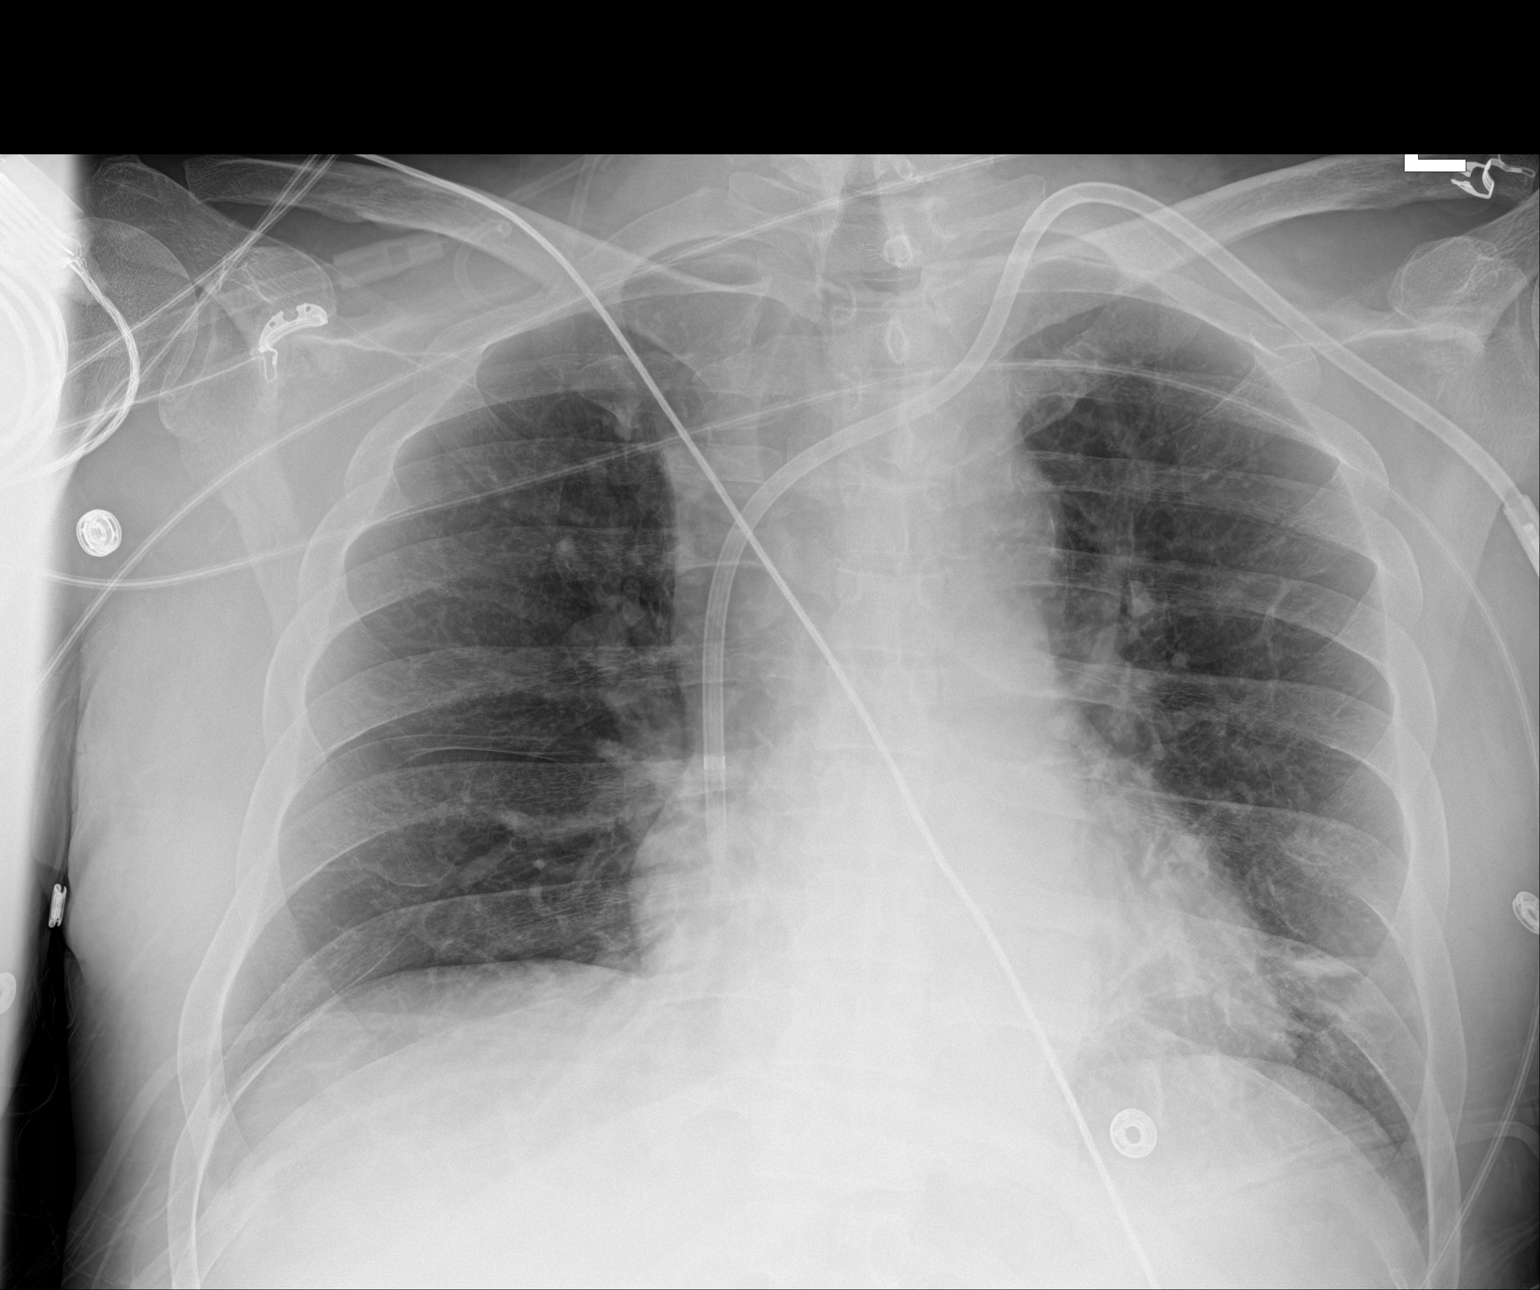

[1 of 1 positions shown; findings below may reference images not displayed]

FINDINGS: A left-sided venous catheter is seen with its distal tip noted
within the right atrium. This is approximately 2.7 cm distal to the
junction of the superior vena cava and right atrium. Mild
atelectasis is seen within the left lung base. There is no evidence
of a pleural effusion or pneumothorax. The heart size and
mediastinal contours are within normal limits. The visualized
skeletal structures are unremarkable.
IMPRESSION: 1. Left-sided venous catheter positioning, as described above.
2. Mild left basilar atelectasis.

## 2019-08-24 SURGERY — INSERTION OF DIALYSIS CATHETER
Anesthesia: General | Site: Neck

## 2019-08-24 MED ORDER — SODIUM CHLORIDE 0.9 % IV SOLN: 250.0000 mL | INTRAVENOUS | Status: DC

## 2019-08-24 MED ORDER — EPHEDRINE SULFATE-NACL 50-0.9 MG/10ML-% IV SOSY
PREFILLED_SYRINGE | INTRAVENOUS | Status: DC | PRN
  Administered 2019-08-24: 10 mg via INTRAVENOUS
  Administered 2019-08-24: 5 mg via INTRAVENOUS
  Administered 2019-08-24: 10 mg via INTRAVENOUS

## 2019-08-24 MED ORDER — ONDANSETRON HCL 4 MG/2ML IJ SOLN
INTRAMUSCULAR | Status: DC | PRN
  Administered 2019-08-24: 4 mg via INTRAVENOUS

## 2019-08-24 MED ORDER — CHLORHEXIDINE GLUCONATE 0.12 % MT SOLN: 15.0000 mL | Freq: Once | OROMUCOSAL | Status: AC

## 2019-08-24 MED ORDER — FENTANYL CITRATE (PF) 250 MCG/5ML IJ SOLN
INTRAMUSCULAR | Status: AC
  Filled 2019-08-24: qty 5

## 2019-08-24 MED ORDER — IODIXANOL 320 MG/ML IV SOLN
INTRAVENOUS | Status: DC | PRN
  Administered 2019-08-24: 50 mL

## 2019-08-24 MED ORDER — CHLORHEXIDINE GLUCONATE 0.12 % MT SOLN
OROMUCOSAL | Status: AC
  Administered 2019-08-24: 15 mL via OROMUCOSAL
  Filled 2019-08-24: qty 15

## 2019-08-24 MED ORDER — DARBEPOETIN ALFA 200 MCG/0.4ML IJ SOSY
200.0000 ug | PREFILLED_SYRINGE | INTRAMUSCULAR | Status: DC
  Administered 2019-08-25: 200 ug via INTRAVENOUS

## 2019-08-24 MED ORDER — ESMOLOL HCL 100 MG/10ML IV SOLN
INTRAVENOUS | Status: AC
  Filled 2019-08-24: qty 10

## 2019-08-24 MED ORDER — ONDANSETRON HCL 4 MG/2ML IJ SOLN
INTRAMUSCULAR | Status: AC
  Filled 2019-08-24: qty 2

## 2019-08-24 MED ORDER — SODIUM CHLORIDE 0.9% IV SOLUTION: Freq: Once | INTRAVENOUS | Status: DC

## 2019-08-24 MED ORDER — PHENYLEPHRINE HCL-NACL 10-0.9 MG/250ML-% IV SOLN
25.0000 ug/min | INTRAVENOUS | Status: DC
  Administered 2019-08-24: 100 ug/min via INTRAVENOUS
  Administered 2019-08-24: 50 ug/min via INTRAVENOUS
  Filled 2019-08-24: qty 250

## 2019-08-24 MED ORDER — PROPOFOL 10 MG/ML IV BOLUS
INTRAVENOUS | Status: AC
  Filled 2019-08-24: qty 20

## 2019-08-24 MED ORDER — MIDAZOLAM HCL 2 MG/2ML IJ SOLN
INTRAMUSCULAR | Status: AC
  Filled 2019-08-24: qty 2

## 2019-08-24 MED ORDER — SODIUM CHLORIDE 0.9 % IV SOLN
INTRAVENOUS | Status: AC
  Filled 2019-08-24: qty 1.2

## 2019-08-24 MED ORDER — LIDOCAINE 2% (20 MG/ML) 5 ML SYRINGE
INTRAMUSCULAR | Status: AC
  Filled 2019-08-24: qty 5

## 2019-08-24 MED ORDER — ONDANSETRON HCL 4 MG/2ML IJ SOLN
4.0000 mg | Freq: Once | INTRAMUSCULAR | Status: AC
  Administered 2019-08-24: 4 mg via INTRAVENOUS

## 2019-08-24 MED ORDER — PROPOFOL 10 MG/ML IV BOLUS
INTRAVENOUS | Status: DC | PRN
  Administered 2019-08-24: 100 mg via INTRAVENOUS

## 2019-08-24 MED ORDER — SODIUM CHLORIDE 0.9 % IV SOLN: INTRAVENOUS | Status: DC | PRN

## 2019-08-24 MED ORDER — HEPARIN SODIUM (PORCINE) 1000 UNIT/ML IJ SOLN
INTRAMUSCULAR | Status: AC
  Filled 2019-08-24: qty 1

## 2019-08-24 MED ORDER — HEPARIN SODIUM (PORCINE) 1000 UNIT/ML IJ SOLN
INTRAMUSCULAR | Status: DC | PRN
  Administered 2019-08-24: 4200 [IU]

## 2019-08-24 MED ORDER — SODIUM CHLORIDE 0.9 % IV SOLN: INTRAVENOUS | Status: DC

## 2019-08-24 MED ORDER — FENTANYL CITRATE (PF) 100 MCG/2ML IJ SOLN: 25.0000 ug | INTRAMUSCULAR | Status: DC | PRN

## 2019-08-24 MED ORDER — PHENYLEPHRINE 40 MCG/ML (10ML) SYRINGE FOR IV PUSH (FOR BLOOD PRESSURE SUPPORT)
PREFILLED_SYRINGE | INTRAVENOUS | Status: AC
  Filled 2019-08-24: qty 10

## 2019-08-24 MED ORDER — LIDOCAINE-EPINEPHRINE 0.5 %-1:200000 IJ SOLN
INTRAMUSCULAR | Status: AC
  Filled 2019-08-24: qty 1

## 2019-08-24 MED ORDER — ESMOLOL HCL 100 MG/10ML IV SOLN
INTRAVENOUS | Status: DC | PRN
  Administered 2019-08-24: 30 mg via INTRAVENOUS

## 2019-08-24 MED ORDER — MIDAZOLAM HCL 5 MG/5ML IJ SOLN
INTRAMUSCULAR | Status: DC | PRN
  Administered 2019-08-24: 2 mg via INTRAVENOUS

## 2019-08-24 MED ORDER — EPHEDRINE 5 MG/ML INJ
INTRAVENOUS | Status: AC
  Filled 2019-08-24: qty 10

## 2019-08-24 MED ORDER — LIDOCAINE 2% (20 MG/ML) 5 ML SYRINGE
INTRAMUSCULAR | Status: DC | PRN
  Administered 2019-08-24: 100 mg via INTRAVENOUS

## 2019-08-24 MED ORDER — SODIUM CHLORIDE 0.9 % IV SOLN: 10.0000 mL/h | Freq: Once | INTRAVENOUS | Status: DC

## 2019-08-24 MED ORDER — LIDOCAINE-EPINEPHRINE 0.5 %-1:200000 IJ SOLN
INTRAMUSCULAR | Status: DC | PRN
  Administered 2019-08-24: 50 mL

## 2019-08-24 SURGICAL SUPPLY — 43 items
ADH SKN CLS APL DERMABOND .7 (GAUZE/BANDAGES/DRESSINGS) ×1
BAG DECANTER FOR FLEXI CONT (MISCELLANEOUS) ×3 IMPLANT
BIOPATCH RED 1 DISK 7.0 (GAUZE/BANDAGES/DRESSINGS) ×2 IMPLANT
BIOPATCH RED 1IN DISK 7.0MM (GAUZE/BANDAGES/DRESSINGS) ×1
CATH BEACON 5 .035 40 KMP TP (CATHETERS) IMPLANT
CATH BEACON 5 .038 40 KMP TP (CATHETERS) ×3
CATH PALINDROME-P 19CM W/VT (CATHETERS) IMPLANT
CATH PALINDROME-P 23CM W/VT (CATHETERS) IMPLANT
CATH PALINDROME-P 28CM W/VT (CATHETERS) ×2 IMPLANT
COVER PROBE W GEL 5X96 (DRAPES) ×3 IMPLANT
COVER SURGICAL LIGHT HANDLE (MISCELLANEOUS) ×3 IMPLANT
COVER WAND RF STERILE (DRAPES) ×1 IMPLANT
DECANTER SPIKE VIAL GLASS SM (MISCELLANEOUS) ×3 IMPLANT
DERMABOND ADVANCED (GAUZE/BANDAGES/DRESSINGS) ×2
DERMABOND ADVANCED .7 DNX12 (GAUZE/BANDAGES/DRESSINGS) ×1 IMPLANT
DRAPE C-ARM 42X72 X-RAY (DRAPES) ×3 IMPLANT
DRAPE CHEST BREAST 15X10 FENES (DRAPES) ×3 IMPLANT
GAUZE 4X4 16PLY RFD (DISPOSABLE) ×3 IMPLANT
GLOVE SS BIOGEL STRL SZ 7.5 (GLOVE) ×1 IMPLANT
GLOVE SUPERSENSE BIOGEL SZ 7.5 (GLOVE) ×2
GOWN STRL REUS W/ TWL LRG LVL3 (GOWN DISPOSABLE) ×2 IMPLANT
GOWN STRL REUS W/TWL LRG LVL3 (GOWN DISPOSABLE) ×6
KIT BASIN OR (CUSTOM PROCEDURE TRAY) ×3 IMPLANT
KIT PALINDROME-P 55CM (CATHETERS) IMPLANT
KIT TURNOVER KIT B (KITS) ×3 IMPLANT
NDL 18GX1X1/2 (RX/OR ONLY) (NEEDLE) ×1 IMPLANT
NDL HYPO 25GX1X1/2 BEV (NEEDLE) ×1 IMPLANT
NEEDLE 18GX1X1/2 (RX/OR ONLY) (NEEDLE) ×3 IMPLANT
NEEDLE 22X1 1/2 (OR ONLY) (NEEDLE) IMPLANT
NEEDLE HYPO 25GX1X1/2 BEV (NEEDLE) ×3 IMPLANT
NS IRRIG 1000ML POUR BTL (IV SOLUTION) ×3 IMPLANT
PACK SURGICAL SETUP 50X90 (CUSTOM PROCEDURE TRAY) ×3 IMPLANT
PAD ARMBOARD 7.5X6 YLW CONV (MISCELLANEOUS) ×6 IMPLANT
SOAP 2 % CHG 4 OZ (WOUND CARE) ×1 IMPLANT
SUT ETHILON 3 0 PS 1 (SUTURE) ×3 IMPLANT
SUT VICRYL 4-0 PS2 18IN ABS (SUTURE) ×3 IMPLANT
SYR 10ML LL (SYRINGE) ×3 IMPLANT
SYR 20ML LL LF (SYRINGE) ×3 IMPLANT
SYR 5ML LL (SYRINGE) ×6 IMPLANT
SYR CONTROL 10ML LL (SYRINGE) ×3 IMPLANT
TOWEL GREEN STERILE (TOWEL DISPOSABLE) ×6 IMPLANT
TOWEL GREEN STERILE FF (TOWEL DISPOSABLE) ×3 IMPLANT
WATER STERILE IRR 1000ML POUR (IV SOLUTION) ×3 IMPLANT
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
## 2019-08-24 NOTE — Progress Notes (Signed)
[facility] Progress Note  Subjective:  S/p TDC placement in anticipation HD today however became hypotensive thereafter requiring neo gtt, weaned off however still hypotensive. Was given 1u prbc and fluids as well. Seen in PACU.  Patient reports that he feels fine no different.  Currently eating his food.  Denies any fevers, chest pain, shortness of breath, swelling, abdominal pain, nausea/vomiting.  Still has some hiccups and indigestion  Vitals:   08/24/19 1510 08/24/19 1525 08/24/19 1540 08/24/19 1555  BP: 107/70 107/74 95/66 (!) 82/33  Pulse: (!) 145 (!) 145 (!) 141 (!) 147  Resp: 11 11 16 10   Temp:      TempSrc:      SpO2: 94% (!) 88% 96% 99%  Weight:      Height:        Exam: Gen: NAD, sitting up in stretcher Resp: cta bl, no w/r/r/c, unlabored, bl chest expansion CV: s1s2, tachycardic Abd: soft, nt, pd cath c/d/i Ext: bl bka, no dependent edema Neuro: awake, speech clear and coherent, moves all extremities spontaneously  Recent Labs  Lab 08/22/19 1200 08/22/19 1441 08/23/19 2015 08/24/19 0114  K 5.2*   < > 4.8 4.5  BUN 143*   < > 118* 116*  CREATININE 26.17*   < > 22.06* 22.22*  CALCIUM 8.4*   < > 7.1* 7.1*  PHOS  --   --   --  11.3*  HGB 9.3*  --   --  7.0*   < > = values in this interval not displayed.   Inpatient medications: . sodium chloride   Intravenous Once  . [MAR Hold] aspirin EC  81 mg Oral Daily  . [MAR Hold] atorvastatin  20 mg Oral Daily  . [MAR Hold] calcitRIOL  0.5 mcg Oral Once per day on Mon Wed Fri  . [MAR Hold] Chlorhexidine Gluconate Cloth  6 each Topical Daily  . [MAR Hold] chlorproMAZINE  25 mg Oral TID  . [MAR Hold] gentamicin cream  1 application Topical Daily  . [MAR Hold] heparin  5,000 Units Subcutaneous Q8H  . [MAR Hold] insulin aspart  0-9 Units Subcutaneous TID WC  . [MAR Hold] insulin aspart  2 Units Subcutaneous TID WC  . [MAR Hold] insulin glargine  8 Units Subcutaneous QHS  . [MAR Hold] multivitamin  1  tablet Oral Daily  . [MAR Hold] pantoprazole (PROTONIX) IV  40 mg Intravenous Q24H  . [MAR Hold] sevelamer carbonate  3,200 mg Oral TID WC   . sodium chloride 10 mL/hr at 08/24/19 0903  . sodium chloride    . sodium chloride    . dextrose 5% lactated ringers 100 mL/hr at 08/23/19 0415  . [MAR Hold] dialysis solution 1.5% low-MG/low-CA    . phenylephrine (NEO-SYNEPHRINE) Adult infusion Stopped (08/24/19 1515)   [MAR Hold] dextrose, [MAR Hold] dextrose, fentaNYL (SUBLIMAZE) injection, [MAR Hold] gabapentin, [MAR Hold] dianeal solution for CAPD/CCPD with heparin, [MAR Hold] hydrALAZINE  Outpatient Center Orders: PD days per week, Nephrologist Dr. Joelyn Oms  EDW 68.5 (kg) (changed to 74kg?) Ca 2.5 (mEq/L) Mg 0.5 (mEq/L) Dextrose 1.5; 2.5; 4.25 %, # Exchanges 5, fil vol 1800 mL, Dwell Time 1 hrs 30 min, last fill vol 0 mL, Day Exchanges 0, Day Dwell Time 0 hrs 0 min    Assessment/Plan: 1. ESRD:  Has not completed PD last 4 days at home, here is azotemic w/ some symptoms of uremia including fatigue, nausea and hiccups, but otherwise stable w/o confusion or myoclonus. Did PD while in-house with  concerns of clogged PD catheter which is to be removed as an outpatient.  Not having adequate clearance with PD.  Transitioning to HD.  Status post tunneled dialysis catheter placement on 8/16 1. We will hold off on hemodialysis given his hemodynamics.  No urgent indication to run him today.  We will plan for tomorrow with no UF provided he is not having increasing pressor requirements 2. Consulted VVS for fistula/graft placement however patient refused 2. Hypotension: Possibly secondary to volume depletion, to be transferred to the ICU for further pressor support.  Holding on hemodialysis for today as above 3.  Cough/SOB:  CXR negative, afebrile, COVID neg. WBC up 15k which has since resolved. Per primary team 4. Hyperkalemia:  Potassium stable at 4.5 today did not do PD overnight. can give lokelma if needed  provided his case greater than 5.5 5.  Hypertension/volume: no volume overloaded, see #2 6.  Anemia: Hgb down to 7. Appears to be due for mircera 258mcg on 8/16 (change to 8/17). S/p 1u prbc today. 7.  Metabolic bone disease:  Calcium low but likely close to goal corrected. Continue calcitriol and renvela.  8.  Nutrition:  Alb low, recommend protein supplement once tolerating PO  Gean Quint, MD [place] 08/24/2019, 4:00 PM

## 2019-08-24 NOTE — Op Note (Signed)
    OPERATIVE REPORT  DATE OF SURGERY: 08/24/2019  PATIENT: Jorge [name], 49 y.o. male MRN: [id number]  DOB: 10-11-1970  PRE-OPERATIVE DIAGNOSIS: End-stage renal disease  POST-OPERATIVE DIAGNOSIS:  Same  PROCEDURE: Left IJ tunneled hemodialysis catheter  SURGEON:  Curt Jews, M.D.  PHYSICIAN ASSISTANT: Nurse  The assistant was needed for exposure and to expedite the case  ANESTHESIA: LMA  EBL: per anesthesia record  Total I/O In: 300 [I.V.:200; IV Piggyback:100] Out: 10 [Blood:10]  BLOOD ADMINISTERED: none  DRAINS: none  SPECIMEN: none  COUNTS CORRECT:  YES  PATIENT DISPOSITION:  PACU - hemodynamically stable  PROCEDURE DETAILS: Patient was taken up and placed position where the area of the right and left neck and chest prepped draped you sterile fashion.  SonoSite ultrasound was used to visualize the right neck.  The patient had a large external jugular vein but internal jugular vein was nonvisualized.  On the left side the internal jugular vein was easily seen..  To be that the patient had somewhat of an enlarged thyroid and this was pushed laterally.  Using SonoSite visualization the left internal jugular vein was entered with an 18-gauge needle and a guidewire was passed centrally.  The guidewire tracked back up the right internal jugular vein.  A Kumpe catheter was placed over the guidewire and the guidewire was directed down to the level of the right atrium.  The dilator and peel-away sheath was passed over the guidewire and the dilator and guidewire removed.  A 28 cm tunnel catheter was brought through a separate stab incision to the level of the peel-away entry site.  The catheter was passed down the peel-away sheath which was removed.  The tips were positioned at the level of distal right atrium.  Both lumens flushed and aspirated easily and the catheter was secured to the skin with a 3-0 nylon stitch.  The entry site was closed with a 4-0 subcuticular Vicryl stitch.   Sterile dressing was applied and the patient was transferred to the recovery room in stable condition   Rosetta Posner, M.D., [facility] 08/24/2019 11:18 AM

## 2019-08-24 NOTE — Progress Notes (Signed)
Pt received from PACU. VSS. CHG complete. L HD cath with scant amnt of blood on drsg. Pt oriented to room and unit.  Clyde Canterbury, RN

## 2019-08-24 NOTE — Anesthesia Postprocedure Evaluation (Signed)
Anesthesia Post Note  Patient: Jorge [name]  Procedure(s) Performed: INSERTION OF DIALYSIS CATHETER LEFT INTERNAL JUGULAR VEIN WITH FLUORO (N/A Neck)     Patient location during evaluation: SICU Anesthesia Type: General Level of consciousness: awake Pain management: pain level controlled Vital Signs Assessment: post-procedure vital signs reviewed and stable Respiratory status: spontaneous breathing Cardiovascular status: unstable Postop Assessment: no apparent nausea or vomiting Anesthetic complications: no Comments: Patient developed hypotension and tachycardia in PACU with pulse to 140 and pressures in 70's over 50's.  Esmolol given in PACU at bedside in addition to a phenylephrine infusion. Patient with known anemia, virtually no blood loss in procedure. Transfused 1 unit red cells in PACU, tachycardia going from 115-135 post transfusion.  Contacted CCM to follow patient once in unit for persistent hypotension   No complications documented.  Last Vitals:  Vitals:   08/24/19 1440 08/24/19 1455  BP: (!) 76/54 120/80  Pulse: (!) 148 (!) 146  Resp: 16 13  Temp:    SpO2: 93% 92%    Last Pain:  Vitals:   08/24/19 1340  TempSrc:   PainSc: Asleep                 Ginnie Marich S

## 2019-08-24 NOTE — Anesthesia Procedure Notes (Signed)
Procedure Name: LMA Insertion Date/Time: 08/24/2019 10:23 AM Performed by: Michele Rockers, CRNA Pre-anesthesia Checklist: Patient identified, Emergency Drugs available, Suction available and Patient being monitored Patient Re-evaluated:Patient Re-evaluated prior to induction Oxygen Delivery Method: Circle system utilized Preoxygenation: Pre-oxygenation with 100% oxygen Induction Type: IV induction Ventilation: Mask ventilation without difficulty LMA Size: 5.0 Number of attempts: 1 Airway Equipment and Method: Oral airway Placement Confirmation: positive ETCO2 and breath sounds checked- equal and bilateral Tube secured with: Tape Dental Injury: Teeth and Oropharynx as per pre-operative assessment

## 2019-08-24 NOTE — Transfer of Care (Signed)
Immediate Anesthesia Transfer of Care Note  Patient: Jorge [name]  Procedure(s) Performed: INSERTION OF DIALYSIS CATHETER LEFT INTERNAL JUGULAR VEIN WITH FLUORO (N/A Neck)  Patient Location: PACU  Anesthesia Type:General  Level of Consciousness: drowsy, patient cooperative and responds to stimulation  Airway & Oxygen Therapy: Patient Spontanous Breathing and Patient connected to face mask oxygen  Post-op Assessment: Report given to RN, Post -op Vital signs reviewed and stable and Patient moving all extremities X 4  Post vital signs: Reviewed and stable  Last Vitals:  Vitals Value Taken Time  BP 96/55 08/24/19 1111  Temp    Pulse 93 08/24/19 1111  Resp 16 08/24/19 1111  SpO2 98 % 08/24/19 1111  Vitals shown include unvalidated device data.  Last Pain:  Vitals:   08/24/19 0711  TempSrc: Oral  PainSc:          Complications: No complications documented.

## 2019-08-24 NOTE — Interval H&P Note (Signed)
History and Physical Interval Note:  08/24/2019 9:59 AM  Jorge [name]  has presented today for surgery, with the diagnosis of Renal Failure.  The various methods of treatment have been discussed with the patient and family. After consideration of risks, benefits and other options for treatment, the patient has consented to  Procedure(s): INSERTION OF DIALYSIS CATHETER WITH FLUORO (N/A) as a surgical intervention.  The patient's history has been reviewed, patient examined, no change in status, stable for surgery.  I have reviewed the patient's chart and labs.  Questions were answered to the patient's satisfaction.     Curt Jews

## 2019-08-24 NOTE — Progress Notes (Signed)
PROGRESS NOTE    Jorge [name]  WIO:[id number] DOB: 1970/12/24 DOA: 08/21/2019 PCP: Lauree Chandler, NP     Brief Narrative:  Jorge [name] a 49 y.o.malewith medical history significant fordiabetes mellitus with complications of gastroparesis and end-stage renal disease on peritoneal dialysis,history of peripheral arterial disease status post bilateral BKA, anemia of chronic kidney disease and protein caloriemalnutrition who presents to the ER for evaluationof feeling poorly for the last couple of days. Patient has not had his peritoneal dialysis in days because he just did not feel well and thinks his last PD was may be 08/18/19.He was having nausea, vomiting and epigastric discomfort and has not been eating or drinking due to this. He has not taken his insulin in the last couple of days because he has not felt well.He denies having any chest pain, diaphoresis or palpitations.He has not had a bowel movement in a couple of days.He denies having any fever or chills,no headache,no dizziness or lightheadedness.   New events last 24 hours / Subjective: No hypoglycemic episodes. Gtt stopped. He is eating, no nausea. hiccupping better since yesterday. Procedure for vasc access today. AG back to baseline given his CKD.   Assessment & Plan:   Principal Problem:   DKA (diabetic ketoacidoses) ([name])             Resolved  BBI  Low SSI  Renal diet post procedure              Active Problems:   ESRD on peritoneal dialysis ([name])             Appreciate Nephro and Vasc surg             Canalization today             Monitor BMP             Calcitriol 0.5 mcg 3 times weekly             Renvela 3200 mg 3 times daily    Diabetes             Lipitor 20 mg daily             EpiPen 30 mg daily as needed    Hiccups             Thorazine 25 mg 3 times daily    Gastroparesis due to DM ([name])    Hyperkalemia             Resolved    Hypertensive urgency              Hydralazine 10 mg q 6 hrs prn  DVT prophylaxis:  heparin injection 5,000 Units Start: 08/22/19 1400  Code Status: Full Family Communication: Self Disposition Plan:   Status is: Inpatient  Remains inpatient appropriate because:Inpatient level of care appropriate due to severity of illness   [name]: The patient is from: Home              Anticipated d/c is to: Home              Anticipated d/c date is: 1-2 more days              Patient currently is not medically stable to d/c.         Consultants:   Vascular surgery  Nephrology  Procedures:  Left IJ tunneled hemodialysis catheter; 08/24/2019  Antimicrobials:  Anti-infectives (From admission, onward)   Start     Dose/Rate Route  Frequency Ordered Stop   08/24/19 0800  ceFAZolin (ANCEF) IVPB 2g/100 mL premix  Status:  Discontinued       Note to Pharmacy: Send with pt to OR   2 g 200 mL/hr over 30 Minutes Intravenous On call 08/23/19 0810 08/23/19 1005   08/24/19 0800  [MAR Hold]  ceFAZolin (ANCEF) IVPB 2g/100 mL premix        (MAR Hold since Mon 08/24/2019 at 0841.Hold Reason: Transfer to a Procedural area.)  Note to Pharmacy: Send with pt to OR   2 g 200 mL/hr over 30 Minutes Intravenous To Surgery 08/23/19 1005 08/24/19 1025   08/24/19 0600  cefUROXime (ZINACEF) 1.5 g in sodium chloride 0.9 % 100 mL IVPB  Status:  Discontinued        1.5 g 200 mL/hr over 30 Minutes Intravenous On call to O.R. 08/23/19 0919 08/23/19 1002       Objective: Vitals:   08/24/19 1215 08/24/19 1220 08/24/19 1230 08/24/19 1240  BP: (!) 90/54 (!) 87/48 (!) 97/55 96/62  Pulse: (!) 129 (!) 129 (!) 132 (!) 140  Resp: 12 13 11 10   Temp:      TempSrc:      SpO2: 100% 96% 100% 100%  Weight:      Height:        Intake/Output Summary (Last 24 hours) at 08/24/2019 1246 Last data filed at 08/24/2019 1104 Gross per 24 hour  Intake 300 ml  Output 10 ml  Net 290 ml   Filed Weights   08/21/19 1720 08/22/19 1802 08/23/19 0751  Weight: 72.6  kg 69.9 kg 72.2 kg    Examination:  General exam: Appears calm and comfortable  Respiratory system: Clear to auscultation. Respiratory effort normal. No respiratory distress. No conversational dyspnea.  Cardiovascular system: S1 & S2 heard, RRR. No LE edema. Gastrointestinal system: Abdomen is nondistended, soft and nontender. Normal bowel sounds heard. Central nervous system: Alert and oriented. No focal neurological deficits. Speech clear.  Extremities: B/l BKA noted Skin: No rashes, lesions or ulcers on exposed skin  Psychiatry: Judgement and insight appear normal. Mood & affect appropriate.   Data Reviewed: I have personally reviewed following labs and imaging studies  CBC: Recent Labs  Lab 08/21/19 1805 08/22/19 1200 08/24/19 0114  WBC 10.7* 15.2* 9.1  HGB 9.2* 9.3* 7.0*  HCT 29.2* 29.1* 21.7*  MCV 89.3 87.4 86.1  PLT 153 146* 235*   Basic Metabolic Panel: Recent Labs  Lab 08/23/19 0041 08/23/19 0426 08/23/19 1519 08/23/19 2015 08/24/19 0114  NA 137 140 136 135 135  K 4.4 4.3 4.4 4.8 4.5  CL 98 98 98 97* 97*  CO2 12* 17* 16* 17* 17*  GLUCOSE 192* 112* 210* 142* 113*  BUN 130* 123* 116* 118* 116*  CREATININE 23.74* 22.81* 21.90* 22.06* 22.22*  CALCIUM 7.7* 7.6* 7.3* 7.1* 7.1*  PHOS  --   --   --   --  11.3*   GFR: Estimated Creatinine Clearance: 4 mL/min (A) (by C-G formula based on SCr of 22.22 mg/dL (H)). Liver Function Tests: Recent Labs  Lab 08/24/19 0114  ALBUMIN 2.2*   HbA1C: Recent Labs    08/22/19 1200  HGBA1C 7.4*   CBG: Recent Labs  Lab 08/23/19 1626 08/23/19 2110 08/24/19 0701 08/24/19 0846 08/24/19 1112  GLUCAP 172* 170* 93 86 97   Recent Results (from the past 240 hour(s))  SARS Coronavirus 2 by RT PCR (hospital order, performed in [facility] hospital lab) Nasopharyngeal Nasopharyngeal Swab  Status: None   Collection Time: 08/22/19  7:40 AM   Specimen: Nasopharyngeal Swab  Result Value Ref Range Status   SARS Coronavirus 2  NEGATIVE NEGATIVE Final    Comment: (NOTE) SARS-CoV-2 target nucleic acids are NOT DETECTED.  The SARS-CoV-2 RNA is generally detectable in upper and lower respiratory specimens during the acute phase of infection. The lowest concentration of SARS-CoV-2 viral copies this assay can detect is 250 copies / mL. A negative result does not preclude SARS-CoV-2 infection and should not be used as the sole basis for treatment or other patient management decisions.  A negative result may occur with improper specimen collection / handling, submission of specimen other than nasopharyngeal swab, presence of viral mutation(s) within the areas targeted by this assay, and inadequate number of viral copies (<250 copies / mL). A negative result must be combined with clinical observations, patient history, and epidemiological information.  Fact Sheet for Patients:   StrictlyIdeas.no  Fact Sheet for Healthcare Providers: BankingDealers.co.za  This test is not yet approved or  cleared by the [place] FDA and has been authorized for detection and/or diagnosis of SARS-CoV-2 by FDA under an Emergency Use Authorization (EUA).  This EUA will remain in effect (meaning this test can be used) for the duration of the COVID-19 declaration under Section 564(b)(1) of the Act, 21 U.S.C. section 360bbb-3(b)(1), unless the authorization is terminated or revoked sooner.  Performed at [facility], [place] [address], [place], [place] [address]   Surgical pcr screen     Status: None   Collection Time: 08/24/19 12:33 AM   Specimen: Nasal Mucosa; Nasal Swab  Result Value Ref Range Status   MRSA, PCR NEGATIVE NEGATIVE Final   Staphylococcus aureus NEGATIVE NEGATIVE Final    Comment: (NOTE) The Xpert SA Assay (FDA approved for NASAL specimens in patients 15 years of age and older), is one component of a comprehensive surveillance program. It is not intended to  diagnose infection nor to guide or monitor treatment. Performed at [facility], [place] [address] [place], [place] [address]       Radiology Studies: DG Fluoro Guide CV Line-No Report  Result Date: 08/24/2019 Fluoroscopy was utilized by the requesting physician.  No radiographic interpretation.      Scheduled Meds:  sodium chloride   Intravenous Once   [MAR Hold] aspirin EC  81 mg Oral Daily   [MAR Hold] atorvastatin  20 mg Oral Daily   [MAR Hold] calcitRIOL  0.5 mcg Oral Once per day on Mon Wed Fri   [facility] Hold] Chlorhexidine Gluconate Cloth  6 each Topical Daily   [MAR Hold] chlorproMAZINE  25 mg Oral TID   [MAR Hold] gentamicin cream  1 application Topical Daily   [MAR Hold] heparin  5,000 Units Subcutaneous Q8H   [MAR Hold] insulin aspart  0-9 Units Subcutaneous TID WC   [MAR Hold] insulin aspart  2 Units Subcutaneous TID WC   [MAR Hold] insulin glargine  8 Units Subcutaneous QHS   [MAR Hold] multivitamin  1 tablet Oral Daily   [MAR Hold] pantoprazole (PROTONIX) IV  40 mg Intravenous Q24H   [MAR Hold] sevelamer carbonate  3,200 mg Oral TID WC   Continuous Infusions:  sodium chloride 10 mL/hr at 08/24/19 0903   sodium chloride     sodium chloride     dextrose 5% lactated ringers 100 mL/hr at 08/23/19 0415   [MAR Hold] dialysis solution 1.5% low-MG/low-CA     phenylephrine (NEO-SYNEPHRINE) Adult infusion  175 mcg/min (08/24/19 1221)     LOS: 2 days      Time spent: 25 minutes   Shelda Pal, DO [facility] 08/24/2019, 12:46 PM   Available via Epic secure chat 7am-7pm After these hours, please refer to coverage provider listed on [email or web address]

## 2019-08-24 NOTE — Anesthesia Preprocedure Evaluation (Signed)
Anesthesia Evaluation  Patient identified by MRN, date of birth, ID band Patient awake    Reviewed: Allergy & Precautions, NPO status , Patient's Chart, lab work & pertinent test results  Airway Mallampati: II  TM Distance: >3 FB Neck ROM: Full    Dental no notable dental hx.    Pulmonary neg pulmonary ROS, former smoker,    Pulmonary exam normal breath sounds clear to auscultation       Cardiovascular hypertension, + Peripheral Vascular Disease  Normal cardiovascular exam Rhythm:Regular Rate:Normal     Neuro/Psych negative neurological ROS  negative psych ROS   GI/Hepatic Neg liver ROS, gastroparesis   Endo/Other  diabetes  Renal/GU DialysisRenal disease  negative genitourinary   Musculoskeletal negative musculoskeletal ROS (+)   Abdominal   Peds negative pediatric ROS (+)  Hematology  (+) anemia ,   Anesthesia Other Findings   Reproductive/Obstetrics negative OB ROS                             Anesthesia Physical Anesthesia Plan  ASA: III  Anesthesia Plan: General   Post-op Pain Management:    Induction: Intravenous and Rapid sequence  PONV Risk Score and Plan: 2 and Ondansetron, Dexamethasone and Treatment may vary due to age or medical condition  Airway Management Planned: Oral ETT  Additional Equipment:   Intra-op Plan:   Post-operative Plan: Extubation in OR  Informed Consent: I have reviewed the patients History and Physical, chart, labs and discussed the procedure including the risks, benefits and alternatives for the proposed anesthesia with the patient or authorized representative who has indicated his/her understanding and acceptance.     Dental advisory given  Plan Discussed with: CRNA and Surgeon  Anesthesia Plan Comments:         Anesthesia Quick Evaluation

## 2019-08-25 ENCOUNTER — Encounter (HOSPITAL_COMMUNITY): Payer: Self-pay | Admitting: Vascular Surgery

## 2019-08-25 LAB — GLUCOSE, CAPILLARY
Glucose-Capillary: 102 mg/dL — ABNORMAL HIGH (ref 70–99)
Glucose-Capillary: 100 mg/dL — ABNORMAL HIGH (ref 70–99)
Glucose-Capillary: 128 mg/dL — ABNORMAL HIGH (ref 70–99)
Glucose-Capillary: 148 mg/dL — ABNORMAL HIGH (ref 70–99)

## 2019-08-25 LAB — BPAM RBC
Blood Product Expiration Date: 202109212359
ISSUE DATE / TIME: 202108161247
Unit Type and Rh: 9500

## 2019-08-25 LAB — CBC
HCT: 25.8 % — ABNORMAL LOW (ref 39.0–52.0)
Hemoglobin: 8.2 g/dL — ABNORMAL LOW (ref 13.0–17.0)
MCH: 28.4 pg (ref 26.0–34.0)
MCHC: 31.8 g/dL (ref 30.0–36.0)
MCV: 89.3 fL (ref 80.0–100.0)
Platelets: 128 10*3/uL — ABNORMAL LOW (ref 150–400)
RBC: 2.89 MIL/uL — ABNORMAL LOW (ref 4.22–5.81)
RDW: 18.5 % — ABNORMAL HIGH (ref 11.5–15.5)
WBC: 8.3 10*3/uL (ref 4.0–10.5)
nRBC: 0 % (ref 0.0–0.2)

## 2019-08-25 LAB — TYPE AND SCREEN
ABO/RH(D): O NEG
Antibody Screen: NEGATIVE
Unit division: 0

## 2019-08-25 LAB — RENAL FUNCTION PANEL
Albumin: 2.2 g/dL — ABNORMAL LOW (ref 3.5–5.0)
Anion gap: 21 — ABNORMAL HIGH (ref 5–15)
BUN: 120 mg/dL — ABNORMAL HIGH (ref 6–20)
CO2: 15 mmol/L — ABNORMAL LOW (ref 22–32)
Calcium: 6.7 mg/dL — ABNORMAL LOW (ref 8.9–10.3)
Chloride: 99 mmol/L (ref 98–111)
Creatinine, Ser: 21.22 mg/dL — ABNORMAL HIGH (ref 0.61–1.24)
GFR calc Af Amer: 3 mL/min — ABNORMAL LOW (ref >60)
GFR calc non Af Amer: 2 mL/min — ABNORMAL LOW (ref >60)
Glucose, Bld: 135 mg/dL — ABNORMAL HIGH (ref 70–99)
Phosphorus: 10.7 mg/dL — ABNORMAL HIGH (ref 2.5–4.6)
Potassium: 4.6 mmol/L (ref 3.5–5.1)
Sodium: 135 mmol/L (ref 135–145)

## 2019-08-25 MED ORDER — CALCIUM CARBONATE ANTACID 500 MG PO CHEW
1.0000 | CHEWABLE_TABLET | Freq: Three times a day (TID) | ORAL | Status: DC
  Administered 2019-08-25 – 2019-08-29 (×9): 200 mg via ORAL
  Filled 2019-08-25 (×9): qty 1

## 2019-08-25 MED ORDER — PROSOURCE PLUS PO LIQD
30.0000 mL | Freq: Three times a day (TID) | ORAL | Status: DC
  Administered 2019-08-25 – 2019-08-29 (×8): 30 mL via ORAL
  Filled 2019-08-25 (×13): qty 30

## 2019-08-25 MED ORDER — HEPARIN SODIUM (PORCINE) 1000 UNIT/ML IJ SOLN
INTRAMUSCULAR | Status: AC
  Administered 2019-08-25: 4200 [IU]
  Filled 2019-08-25: qty 5

## 2019-08-25 MED ORDER — PANTOPRAZOLE SODIUM 40 MG PO TBEC
40.0000 mg | DELAYED_RELEASE_TABLET | Freq: Every day | ORAL | Status: DC
  Administered 2019-08-26 – 2019-08-29 (×3): 40 mg via ORAL
  Filled 2019-08-25 (×3): qty 1

## 2019-08-25 MED ORDER — DARBEPOETIN ALFA 200 MCG/0.4ML IJ SOSY
PREFILLED_SYRINGE | INTRAMUSCULAR | Status: AC
  Filled 2019-08-25: qty 0.4

## 2019-08-25 NOTE — Progress Notes (Signed)
  Progress Note    08/25/2019 9:48 AM 1 Day Post-Op  Subjective:  Patient seen in HD. Catheter is functioning well. Patient reports no pain   Vitals:   08/25/19 0900 08/25/19 0930  BP: (!) 152/87 (!) 147/81  Pulse: 81 79  Resp: 16 16  Temp:    SpO2: 100% 99%   Physical Exam: Cardiac: regular, left IJ TDC in place. Left neck incision clean and dry. No swelling. No tenderness. Minimal bloody drainage on dressing Lungs:  Non labored Neurologic: alert and oriented  CBC    Component Value Date/Time   WBC 8.3 08/25/2019 0249   RBC 2.89 (L) 08/25/2019 0249   HGB 8.2 (L) 08/25/2019 0249   HCT 25.8 (L) 08/25/2019 0249   PLT 128 (L) 08/25/2019 0249   MCV 89.3 08/25/2019 0249   MCH 28.4 08/25/2019 0249   MCHC 31.8 08/25/2019 0249   RDW 18.5 (H) 08/25/2019 0249   LYMPHSABS 1.7 04/20/2019 0537   MONOABS 1.0 04/20/2019 0537   EOSABS 0.5 04/20/2019 0537   BASOSABS 0.1 04/20/2019 0537    BMET    Component Value Date/Time   NA 135 08/25/2019 0249   NA 136 (A) 07/10/2017 0000   K 4.6 08/25/2019 0249   CL 99 08/25/2019 0249   CO2 15 (L) 08/25/2019 0249   GLUCOSE 135 (H) 08/25/2019 0249   BUN 120 (H) 08/25/2019 0249   BUN 51 (A) 07/10/2017 0000   CREATININE 21.22 (H) 08/25/2019 0249   CALCIUM 6.7 (L) 08/25/2019 0249   GFRNONAA 2 (L) 08/25/2019 0249   GFRAA 3 (L) 08/25/2019 0249    INR No results found for: INR   Intake/Output Summary (Last 24 hours) at 08/25/2019 0948 Last data filed at 08/24/2019 2300 Gross per 24 hour  Intake 2371.25 ml  Output 435 ml  Net 1936.25 ml     Assessment/Plan:  49 y.o. male is s/p insertion of left IJ tunneled hemodialysis catheter. Catheter is functioning well. Patient is not interested in permanent access at this time. Will remain available for permanent access planning if patient changes his mind   Karoline Caldwell, [facility] [phone] 08/25/2019 9:48 AM

## 2019-08-25 NOTE — Progress Notes (Signed)
Renal Navigator spoke with Nephrologist upon reviewing notes and has initiated referral for OP HD clinic by reaching out to Home [facility] office to have patient's modality switched and leaving message with [facility] staff to see if they can accommodate patient, as this is the closest clinic to patient's home. Navigator awaiting call back and will follow closely.  Alphonzo Cruise, [place] [facility] [phone]

## 2019-08-25 NOTE — Progress Notes (Signed)
[facility] Progress Note  Subjective:  BP stable, did not require further pressor support. Does not need ICU. Did well with HD earlier today and bp remained stable. No complaints, feels well. Not eager to go home yet.  Vitals:   08/25/19 1000 08/25/19 1009 08/25/19 1010 08/25/19 1044  BP: (!) 144/74 134/79 (!) 158/83 137/78  Pulse:   80 84  Resp:   15 19  Temp:   98.6 F (37 C) 97.8 F (36.6 C)  TempSrc:   Oral Oral  SpO2:   100% 100%  Weight:   78 kg   Height:        Exam: Gen: NAD, sitting up in bed Resp: cta bl, no w/r/r/c, unlabored, bl chest expansion CV: s1s2, tachycardic Abd: soft, nt, pd cath c/d/i Ext: bl bka, no dependent edema Neuro: awake, speech clear and coherent, moves all extremities spontaneously  Recent Labs  Lab 08/24/19 0114 08/25/19 0249  K 4.5 4.6  BUN 116* 120*  CREATININE 22.22* 21.22*  CALCIUM 7.1* 6.7*  PHOS 11.3* 10.7*  HGB 7.0* 8.2*   Inpatient medications: . aspirin EC  81 mg Oral Daily  . atorvastatin  20 mg Oral Daily  . calcitRIOL  0.5 mcg Oral Once per day on Mon Wed Fri  . Chlorhexidine Gluconate Cloth  6 each Topical Daily  . chlorproMAZINE  25 mg Oral TID  . darbepoetin (ARANESP) injection - DIALYSIS  200 mcg Intravenous Q Tue-HD  . gentamicin cream  1 application Topical Daily  . heparin  5,000 Units Subcutaneous Q8H  . insulin aspart  0-9 Units Subcutaneous TID WC  . insulin aspart  2 Units Subcutaneous TID WC  . insulin glargine  8 Units Subcutaneous QHS  . multivitamin  1 tablet Oral Daily  . pantoprazole (PROTONIX) IV  40 mg Intravenous Q24H  . sevelamer carbonate  3,200 mg Oral TID WC   . dextrose 5% lactated ringers 100 mL/hr at 08/23/19 0415  . dialysis solution 1.5% low-MG/low-CA     dextrose, dextrose, gabapentin, dianeal solution for CAPD/CCPD with heparin, hydrALAZINE  Outpatient Center Orders: PD days per week, Nephrologist Dr. Joelyn Oms  EDW 68.5 (kg) (changed to 74kg?) Ca 2.5 (mEq/L) Mg 0.5  (mEq/L) Dextrose 1.5; 2.5; 4.25 %, # Exchanges 5, fil vol 1800 mL, Dwell Time 1 hrs 30 min, last fill vol 0 mL, Day Exchanges 0, Day Dwell Time 0 hrs 0 min    Assessment/Plan: 1. ESRD:  Has not completed PD last 4 days at home, here is azotemic w/ some symptoms of uremia including fatigue, nausea and hiccups, but otherwise stable w/o confusion or myoclonus. Did PD while in-house with concerns of clogged PD catheter which is to be removed as an outpatient.  Not having adequate clearance with PD.  Transitioning to HD.  Status post tunneled dialysis catheter placement on 8/16 1. S/p hd today 8/17, next HD 8/19. 2. Consulted VVS for fistula/graft placement however patient refused 3. Start clip process for outpatient planning 2. Hypotension: resolved, didn't need ICU. BP stable post HD today 3.  Cough/SOB:  CXR negative, afebrile, COVID neg. WBC up 15k which has since resolved. Per primary team 4. Hyperkalemia:  resolved 5.  Hypertension/volume: no volume overloaded. uf as tolerated  6.  Anemia: Hgb down to 7 yday, now better 8.2. Appears to be due for mircera 266mcg on 8/16 (change to 8/17). S/p 1u prbc today. 7.  Metabolic bone disease:  Calcium low but likely close to goal corrected. Continue calcitriol  and renvela.  8.  Nutrition:  Alb low, recommend protein supplement once tolerating PO  Jorge Quint, MD [facility] 08/24/2019, 4:00 PM

## 2019-08-25 NOTE — Progress Notes (Signed)
PROGRESS NOTE    DONTAE [name]  KWI:[id number] DOB: [birth year] DOA: 08/21/2019 PCP: Lauree Chandler, NP    Brief Narrative:  49 year old gentleman with history of type 2 diabetes on insulin, gastroparesis, ESRD on peritoneal dialysis, history of peripheral artery disease status post bilateral below-knee amputation, anemia of chronic disease and protein calorie malnutrition presented to the emergency room for evaluation of feeling poorly for last couple of days.  He was not using his peritoneal dialysis for at least 4 days.  He was also having nausea, vomiting and epigastric discomfort and no appetite.  At the emergency room, he was found with significant electrolyte abnormalities, anion gap metabolic acidosis and hyperkalemia with potassium 5.9.  Creatinine was 24.7.  Admited to the hospital with consultations.   Assessment & Plan:   Principal Problem:   DKA (diabetic ketoacidoses) ([place]) Active Problems:   End-stage renal disease needing dialysis ([place])   ESRD on peritoneal dialysis ([place])   Gastroparesis due to DM ([place])   Hyperkalemia   Hypertensive urgency   Hypotension  Diabetic ketoacidosis: Resolved.  Was treated with IV insulin.  Subsequently developed hypoglycemia that has been stabilized now.  Currently on almost home doses of long-acting insulin and prandial insulin.  Stable.  ESRD on peritoneal dialysis: Failed to adequately remove volume.  Nephrology following.  Received a temporary dialysis catheter with vascular surgery on the left IJ and getting hemodialysis. Patient was advised for permanent dialysis access, he is not ready yet.  He wants to see how he does.  Hypertensive Urgency: presentation BP >170. Not on treatment ay home. noe on hydealazine.  Hyperkalemia: Improved with dialysis.    DVT prophylaxis: heparin injection 5,000 Units Start: 08/22/19 1400   Code Status: Full code Family Communication: None Disposition Plan: Status is: Inpatient  Remains  inpatient appropriate because:IV treatments appropriate due to intensity of illness or inability to take PO   [name]: The patient is from: Home              Anticipated d/c is to: Home              Anticipated d/c date is: 2 days              Patient currently is not medically stable to d/c.         Consultants:   Nephrology  Vascular surgery  Procedures:   Dialysis  Antimicrobials:   None     Subjective: Patient was seen and examined.  No overnight events.  Patient denies any chest pain or shortness of breath.  Denies any abdominal pain.  He thinks his hemodialysis is helping.  He is not sure yet to continue dialysis at home. He complains of some epigastric discomfort and fullness.  Has underlying gastroparesis.  Objective: Vitals:   08/25/19 1009 08/25/19 1010 08/25/19 1044 08/25/19 1134  BP: 134/79 (!) 158/83 137/78 (!) 150/74  Pulse:  80 84 83  Resp:  15 19 17   Temp:  98.6 F (37 C) 97.8 F (36.6 C) 97.9 F (36.6 C)  TempSrc:  Oral Oral Oral  SpO2:  100% 100% 100%  Weight:  78 kg    Height:        Intake/Output Summary (Last 24 hours) at 08/25/2019 1410 Last data filed at 08/25/2019 1010 Gross per 24 hour  Intake 2071.25 ml  Output 447 ml  Net 1624.25 ml   Filed Weights   08/23/19 0751 08/25/19 0500 08/25/19 1010  Weight: 72.2 kg 77.5 kg 78  kg    Examination:  General exam: Appears calm and comfortable , chronically sick looking.  Not in any distress. Respiratory system: Clear to auscultation. Respiratory effort normal. Cardiovascular system: S1 & S2 heard, RRR. No JVD, murmurs, rubs, gallops or clicks. Skin: No rashes, lesions or ulcers Psychiatry: Judgement and insight appear normal. Mood & affect appropriate.      Data Reviewed: I have personally reviewed following labs and imaging studies  CBC: Recent Labs  Lab 08/21/19 1805 08/22/19 1200 08/24/19 0114 08/25/19 0249  WBC 10.7* 15.2* 9.1 8.3  HGB 9.2* 9.3* 7.0* 8.2*  HCT 29.2*  29.1* 21.7* 25.8*  MCV 89.3 87.4 86.1 89.3  PLT 153 146* 123* 056*   Basic Metabolic Panel: Recent Labs  Lab 08/23/19 0426 08/23/19 1519 08/23/19 2015 08/24/19 0114 08/25/19 0249  NA 140 136 135 135 135  K 4.3 4.4 4.8 4.5 4.6  CL 98 98 97* 97* 99  CO2 17* 16* 17* 17* 15*  GLUCOSE 112* 210* 142* 113* 135*  BUN 123* 116* 118* 116* 120*  CREATININE 22.81* 21.90* 22.06* 22.22* 21.22*  CALCIUM 7.6* 7.3* 7.1* 7.1* 6.7*  PHOS  --   --   --  11.3* 10.7*   GFR: Estimated Creatinine Clearance: 4.4 mL/min (A) (by C-G formula based on SCr of 21.22 mg/dL (H)). Liver Function Tests: Recent Labs  Lab 08/24/19 0114 08/25/19 0249  ALBUMIN 2.2* 2.2*   No results for input(s): LIPASE, AMYLASE in the last 168 hours. No results for input(s): AMMONIA in the last 168 hours. Coagulation Profile: No results for input(s): INR, PROTIME in the last 168 hours. Cardiac Enzymes: No results for input(s): CKTOTAL, CKMB, CKMBINDEX, TROPONINI in the last 168 hours. BNP (last 3 results) No results for input(s): PROBNP in the last 8760 hours. HbA1C: No results for input(s): HGBA1C in the last 72 hours. CBG: Recent Labs  Lab 08/24/19 1112 08/24/19 1904 08/24/19 2035 08/25/19 0611 08/25/19 1136  GLUCAP 97 123* 161* 102* 100*   Lipid Profile: No results for input(s): CHOL, HDL, LDLCALC, TRIG, CHOLHDL, LDLDIRECT in the last 72 hours. Thyroid Function Tests: No results for input(s): TSH, T4TOTAL, FREET4, T3FREE, THYROIDAB in the last 72 hours. Anemia Panel: No results for input(s): VITAMINB12, FOLATE, FERRITIN, TIBC, IRON, RETICCTPCT in the last 72 hours. Sepsis Labs: No results for input(s): PROCALCITON, LATICACIDVEN in the last 168 hours.  Recent Results (from the past 240 hour(s))  SARS Coronavirus 2 by RT PCR (hospital order, performed in [facility] hospital lab) Nasopharyngeal Nasopharyngeal Swab     Status: None   Collection Time: 08/22/19  7:40 AM   Specimen: Nasopharyngeal Swab  Result  Value Ref Range Status   SARS Coronavirus 2 NEGATIVE NEGATIVE Final    Comment: (NOTE) SARS-CoV-2 target nucleic acids are NOT DETECTED.  The SARS-CoV-2 RNA is generally detectable in upper and lower respiratory specimens during the acute phase of infection. The lowest concentration of SARS-CoV-2 viral copies this assay can detect is 250 copies / mL. A negative result does not preclude SARS-CoV-2 infection and should not be used as the sole basis for treatment or other patient management decisions.  A negative result may occur with improper specimen collection / handling, submission of specimen other than nasopharyngeal swab, presence of viral mutation(s) within the areas targeted by this assay, and inadequate number of viral copies (<250 copies / mL). A negative result must be combined with clinical observations, patient history, and epidemiological information.  Fact Sheet for Patients:   StrictlyIdeas.no  Fact Sheet for Healthcare Providers: BankingDealers.co.za  This test is not yet approved or  cleared by the [place] FDA and has been authorized for detection and/or diagnosis of SARS-CoV-2 by FDA under an Emergency Use Authorization (EUA).  This EUA will remain in effect (meaning this test can be used) for the duration of the COVID-19 declaration under Section 564(b)(1) of the Act, 21 U.S.C. section 360bbb-3(b)(1), unless the authorization is terminated or revoked sooner.  Performed at [facility], [place] [address], [place], [place] [address]   Surgical pcr screen     Status: None   Collection Time: 08/24/19 12:33 AM   Specimen: Nasal Mucosa; Nasal Swab  Result Value Ref Range Status   MRSA, PCR NEGATIVE NEGATIVE Final   Staphylococcus aureus NEGATIVE NEGATIVE Final    Comment: (NOTE) The Xpert SA Assay (FDA approved for NASAL specimens in patients 2 years of age and older), is one component of a  comprehensive surveillance program. It is not intended to diagnose infection nor to guide or monitor treatment. Performed at [facility], [place] [address] [place], [place] [address]          Radiology Studies: [facility] Chest [place] 1V same Day  Result Date: 08/24/2019 CLINICAL DATA:  Status post dialysis catheter placement. EXAM: PORTABLE CHEST 1 VIEW COMPARISON:  August 22, 2019 FINDINGS: A left-sided venous catheter is seen with its distal tip noted within the right atrium. This is approximately 2.7 cm distal to the junction of the superior vena cava and right atrium. Mild atelectasis is seen within the left lung base. There is no evidence of a pleural effusion or pneumothorax. The heart size and mediastinal contours are within normal limits. The visualized skeletal structures are unremarkable. IMPRESSION: 1. Left-sided venous catheter positioning, as described above. 2. Mild left basilar atelectasis. Electronically Signed   By: Virgina Norfolk M.D.   On: 08/24/2019 15:13   DG Fluoro Guide CV Line-No Report  Result Date: 08/24/2019 Fluoroscopy was utilized by the requesting physician.  No radiographic interpretation.        Scheduled Meds: . (feeding supplement) PROSource Plus  30 mL Oral TID BM  . aspirin EC  81 mg Oral Daily  . atorvastatin  20 mg Oral Daily  . calcitRIOL  0.5 mcg Oral Once per day on Mon Wed Fri  . calcium carbonate  1 tablet Oral TID WC  . Chlorhexidine Gluconate Cloth  6 each Topical Daily  . darbepoetin (ARANESP) injection - DIALYSIS  200 mcg Intravenous Q Tue-HD  . gentamicin cream  1 application Topical Daily  . heparin  5,000 Units Subcutaneous Q8H  . insulin aspart  0-9 Units Subcutaneous TID WC  . insulin aspart  2 Units Subcutaneous TID WC  . insulin glargine  8 Units Subcutaneous QHS  . multivitamin  1 tablet Oral Daily  . pantoprazole  40 mg Oral Daily  . sevelamer carbonate  3,200 mg Oral TID WC   Continuous Infusions:    LOS: 3 days     Time spent: 25 minutes    Barb Merino, MD [facility] Pager [phone]

## 2019-08-25 NOTE — Progress Notes (Signed)
PCCM Interval Progress Note  No acute events. BP stable off neo. He feels good and plan is for HD today per nephrology.   Nothing further to add.  PCCM will sign off.  Please do not hesitate to call us back if we can be of any further assistance.   Montey Hora, [facility] 08/25/2019, 11:22 AM

## 2019-08-25 NOTE — Progress Notes (Signed)
Initial Nutrition Assessment  DOCUMENTATION CODES:   Not applicable  INTERVENTION:    30 ml ProSource Plus TID, each supplement provides 100 kcals and 15 grams protein.   Boost Breeze po BID, each supplement provides 250 kcal and 9 grams of protein  Renal MVI daily   NUTRITION DIAGNOSIS:   Increased nutrient needs related to acute illness as evidenced by estimated needs.  GOAL:   Patient will meet greater than or equal to 90% of their needs  MONITOR:   PO intake, Weight trends, Supplement acceptance, Labs, I & O's  REASON FOR ASSESSMENT:   Consult Assessment of nutrition requirement/status  ASSESSMENT:   Patient with PMH significant for DM, gastroparesis, HTN, ESRD on PD, and bilateral BKAs. Presents this admission with DKA.   8/16- s/p L tunneled cath  Has not taken insulin or had PD treatments in the last couple of days due to nausea, vomiting, and abdominal discomfort.   Had HD this am.   Pt endorses loss in appetite over the last few days but denies any issue eating prior. Typically consumes 5-6 smaller meals daily (due to gastroparesis) that consist of eggs, cheese burgers, fish, chicken, and sweet potatoes. Does not take Ensure or Nepro as it causes abdominal discomfort. Has protein modular in large bottle that he will take sips from inconsistently. Reports his last lab draw showed phosphorus of 10.6. He takes binders when he remembers and finds eating out is when he usually forgets.   Current PD rx; Ca 2.5 (mEq/L) Mg 0.5 (mEq/L) Dextrose 1.5; 2.5; 4.25 %, # Exchanges 5, fil vol 1800 mL, Dwell Time 1 hrs 30 min, last fill vol 0 mL, Day Exchanges 0, Day Dwell Time 0 hrs 0 min  Endorses a UBW of 165 lb and is unsure of recent weight loss. EDW documented per nephrology as 151 lb. Records indicate weight has increased from 147 lb on 7/12 to 154 lb this admission (likely fluid component).   Medications: calcitriol, TUMS, aranesp, SS novolog, lantus, rena-vit,  renvela Labs: Phosphorus 10.7 (H) CBG 100-161   NUTRITION - FOCUSED PHYSICAL EXAM:    Most Recent Value  Orbital Region No depletion  Upper Arm Region Mild depletion  Thoracic and Lumbar Region Unable to assess  Buccal Region No depletion  Temple Region No depletion  Clavicle Bone Region Mild depletion  Clavicle and Acromion Bone Region Mild depletion  Scapular Bone Region Unable to assess  Dorsal Hand Mild depletion  Patellar Region Unable to assess  Anterior Thigh Region Unable to assess  Posterior Calf Region Unable to assess  Edema (RD Assessment) Unable to assess  Hair Reviewed  Eyes Reviewed  Mouth Reviewed  Skin Reviewed  Nails Reviewed     Diet Order:   Diet Order            Diet renal/carb modified with fluid restriction Fluid restriction: 1200 mL Fluid; Room service appropriate? Yes; Fluid consistency: Thin  Diet effective now                 EDUCATION NEEDS:   Not appropriate for education at this time  Skin:  Skin Assessment: Reviewed RN Assessment  Last BM:  8/16  Height:   Ht Readings from Last 1 Encounters:  08/21/19 5\' 11"  (1.803 m)    Weight:   Wt Readings from Last 1 Encounters:  08/25/19 78 kg    BMI:  Body mass index is 23.98 kg/m.  Estimated Nutritional Needs:   Kcal:  2500-2700 kcal  Protein:  125-140 grams  Fluid:  1000 ml + UOP   Mariana Single RD, LDN Clinical Nutrition Pager listed in [place]

## 2019-08-25 NOTE — Progress Notes (Signed)
Pt received from HD. VSS. Call bell in reach. Will continue to monitor.  Emsley Custer R Ariyonna Twichell, RN  

## 2019-08-26 ENCOUNTER — Encounter
Payer: Medicare Other | Attending: Physical Medicine and Rehabilitation | Admitting: Physical Medicine and Rehabilitation

## 2019-08-26 DIAGNOSIS — Z89511 Acquired absence of right leg below knee: Secondary | ICD-10-CM | POA: Insufficient documentation

## 2019-08-26 DIAGNOSIS — I160 Hypertensive urgency: Secondary | ICD-10-CM

## 2019-08-26 DIAGNOSIS — Z992 Dependence on renal dialysis: Secondary | ICD-10-CM | POA: Insufficient documentation

## 2019-08-26 DIAGNOSIS — G546 Phantom limb syndrome with pain: Secondary | ICD-10-CM | POA: Insufficient documentation

## 2019-08-26 DIAGNOSIS — Z89512 Acquired absence of left leg below knee: Secondary | ICD-10-CM | POA: Insufficient documentation

## 2019-08-26 DIAGNOSIS — N186 End stage renal disease: Secondary | ICD-10-CM | POA: Insufficient documentation

## 2019-08-26 DIAGNOSIS — E875 Hyperkalemia: Secondary | ICD-10-CM

## 2019-08-26 LAB — CBC
HCT: 25.9 % — ABNORMAL LOW (ref 39.0–52.0)
Hemoglobin: 8.2 g/dL — ABNORMAL LOW (ref 13.0–17.0)
MCH: 28.4 pg (ref 26.0–34.0)
MCHC: 31.7 g/dL (ref 30.0–36.0)
MCV: 89.6 fL (ref 80.0–100.0)
Platelets: 124 10*3/uL — ABNORMAL LOW (ref 150–400)
RBC: 2.89 MIL/uL — ABNORMAL LOW (ref 4.22–5.81)
RDW: 18.4 % — ABNORMAL HIGH (ref 11.5–15.5)
WBC: 8.5 10*3/uL (ref 4.0–10.5)
nRBC: 0 % (ref 0.0–0.2)

## 2019-08-26 LAB — RENAL FUNCTION PANEL
Albumin: 2.1 g/dL — ABNORMAL LOW (ref 3.5–5.0)
Anion gap: 15 (ref 5–15)
BUN: 66 mg/dL — ABNORMAL HIGH (ref 6–20)
CO2: 20 mmol/L — ABNORMAL LOW (ref 22–32)
Calcium: 7 mg/dL — ABNORMAL LOW (ref 8.9–10.3)
Chloride: 98 mmol/L (ref 98–111)
Creatinine, Ser: 13.41 mg/dL — ABNORMAL HIGH (ref 0.61–1.24)
GFR calc Af Amer: 4 mL/min — ABNORMAL LOW (ref >60)
GFR calc non Af Amer: 4 mL/min — ABNORMAL LOW (ref >60)
Glucose, Bld: 74 mg/dL (ref 70–99)
Phosphorus: 6.8 mg/dL — ABNORMAL HIGH (ref 2.5–4.6)
Potassium: 4 mmol/L (ref 3.5–5.1)
Sodium: 133 mmol/L — ABNORMAL LOW (ref 135–145)

## 2019-08-26 LAB — GLUCOSE, CAPILLARY
Glucose-Capillary: 66 mg/dL — ABNORMAL LOW (ref 70–99)
Glucose-Capillary: 92 mg/dL (ref 70–99)
Glucose-Capillary: 153 mg/dL — ABNORMAL HIGH (ref 70–99)
Glucose-Capillary: 79 mg/dL (ref 70–99)
Glucose-Capillary: 136 mg/dL — ABNORMAL HIGH (ref 70–99)

## 2019-08-26 LAB — LIPASE, BLOOD: Lipase: 23 U/L (ref 11–51)

## 2019-08-26 MED ORDER — CARVEDILOL 12.5 MG PO TABS
12.5000 mg | ORAL_TABLET | Freq: Two times a day (BID) | ORAL | Status: DC
  Administered 2019-08-26 – 2019-08-29 (×5): 12.5 mg via ORAL
  Filled 2019-08-26 (×5): qty 1

## 2019-08-26 MED ORDER — INSULIN ASPART 100 UNIT/ML ~~LOC~~ SOLN
0.0000 [IU] | Freq: Three times a day (TID) | SUBCUTANEOUS | Status: DC
  Administered 2019-08-27 – 2019-08-29 (×2): 1 [IU] via SUBCUTANEOUS

## 2019-08-26 MED ORDER — INSULIN GLARGINE 100 UNIT/ML ~~LOC~~ SOLN
7.0000 [IU] | Freq: Every day | SUBCUTANEOUS | Status: DC
  Administered 2019-08-26 – 2019-08-27 (×2): 7 [IU] via SUBCUTANEOUS
  Filled 2019-08-26 (×5): qty 0.07

## 2019-08-26 MED ORDER — HYDROMORPHONE HCL 1 MG/ML IJ SOLN
1.0000 mg | INTRAMUSCULAR | Status: AC | PRN
  Administered 2019-08-26 – 2019-08-27 (×4): 1 mg via INTRAVENOUS
  Filled 2019-08-26 (×4): qty 1

## 2019-08-26 NOTE — Progress Notes (Signed)
Inpatient Diabetes Program Recommendations  AACE/ADA: New Consensus Statement on Inpatient Glycemic Control (2015)  Target Ranges:  Prepandial:   less than 140 mg/dL      Peak postprandial:   less than 180 mg/dL (1-2 hours)      Critically ill patients:  140 - 180 mg/dL   Lab Results  Component Value Date   GLUCAP 92 08/26/2019   HGBA1C 7.4 (H) 08/22/2019    Review of Glycemic Control Results for PAO, HAFFEY (MRN [id number]) as of 08/26/2019 10:06  Ref. Range 08/25/2019 21:24 08/26/2019 06:18 08/26/2019 06:41  Glucose-Capillary Latest Ref Range: 70 - 99 mg/dL 148 (H) 66 (L) 92   Diabetes history: Type 2 DM Outpatient Diabetes medications: Lantus 10 units QD, Novolog 3-5 units TID Current orders for Inpatient glycemic control: Novolog 2 units TID, Novolog 0-9 units TID, Lantus 8 units QHS  Inpatient Diabetes Program Recommendations:    Noted mild hypoglycemia this AM. Consider decreasing Lantus 7 units QHS and correction to Novolog 0-6 units TID.   Thanks, Bronson Curb, MSN, RNC-OB Diabetes Coordinator [phone] (8a-5p)

## 2019-08-26 NOTE — Progress Notes (Signed)
Renal Navigator received call back from [employer] RN to discuss patient's situation and need to for modality change from PD to HD. He will discuss with clinic Medical Director/Dr. Royce Macadamia and call Navigator back. Renal Navigator to follow closely.  Alphonzo Cruise, [place] Renal Navigator [phone]

## 2019-08-26 NOTE — Progress Notes (Signed)
[facility] Progress Note  Subjective:  Hypertensive with sbp up to 180's. Overall feels much better after dialysis. Still wants to think about avf/avg (explained risks and benefits). Denies n/v/loss of appetite/dysguesia, cp, sob.  Vitals:   08/25/19 2337 08/26/19 0500 08/26/19 0531 08/26/19 0736  BP: (!) 173/79  (!) 181/88 (!) 181/93  Pulse: 86  85 86  Resp: 15  15 16   Temp: 98.5 F (36.9 C)  98.8 F (37.1 C) 98.1 F (36.7 C)  TempSrc: Oral  Oral Oral  SpO2: 100%  99% 100%  Weight:  79 kg    Height:        Exam: Gen: NAD, sitting up in bed, eating breakfast Resp: cta bl, no w/r/r/c, unlabored, bl chest expansion CV: s1s2, tachycardic Abd: soft, nt, pd cath c/d/i Ext: bl bka, no dependent edema Neuro: awake, speech clear and coherent, moves all extremities spontaneously  Recent Labs  Lab 08/25/19 0249 08/26/19 0619  K 4.6 4.0  BUN 120* 66*  CREATININE 21.22* 13.41*  CALCIUM 6.7* 7.0*  PHOS 10.7* 6.8*  HGB 8.2* 8.2*   Inpatient medications: . (feeding supplement) PROSource Plus  30 mL Oral TID BM  . aspirin EC  81 mg Oral Daily  . atorvastatin  20 mg Oral Daily  . calcitRIOL  0.5 mcg Oral Once per day on Mon Wed Fri  . calcium carbonate  1 tablet Oral TID WC  . Chlorhexidine Gluconate Cloth  6 each Topical Daily  . darbepoetin (ARANESP) injection - DIALYSIS  200 mcg Intravenous Q Tue-HD  . gentamicin cream  1 application Topical Daily  . heparin  5,000 Units Subcutaneous Q8H  . insulin aspart  0-9 Units Subcutaneous TID WC  . insulin aspart  2 Units Subcutaneous TID WC  . insulin glargine  8 Units Subcutaneous QHS  . multivitamin  1 tablet Oral Daily  . pantoprazole  40 mg Oral Daily  . sevelamer carbonate  3,200 mg Oral TID WC    dextrose, dextrose, gabapentin, hydrALAZINE  Outpatient Center Orders: PD days per week, Nephrologist Dr. Joelyn Oms  EDW 68.5 (kg) (changed to 74kg?) Ca 2.5 (mEq/L) Mg 0.5 (mEq/L) Dextrose 1.5; 2.5; 4.25 %, #  Exchanges 5, fil vol 1800 mL, Dwell Time 1 hrs 30 min, last fill vol 0 mL, Day Exchanges 0, Day Dwell Time 0 hrs 0 min    Assessment/Plan: 1. ESRD:  Has not completed PD last 4 days at home, here is azotemic w/ some symptoms of uremia including fatigue, nausea and hiccups, but otherwise stable w/o confusion or myoclonus. Did PD while in-house with concerns of clogged PD catheter which is to be removed as an outpatient.  Not having adequate clearance with PD.  Transitioning to HD.  Status post tunneled dialysis catheter placement on 8/16 1. S/p hd today 8/17, next HD 8/19. 2. Consulted VVS for fistula/graft placement however patient refused.  Discussed the importance of this further with the patient today explained the risks of keeping a catheter in place.  At this junction he wants to think about it further 3. Outpatient dialysis plan, started clip 2. Hypotension: resolved, didn't need ICU (had brief pressor requirement). BP stable post HD. Now hypertensive 3.  Cough/SOB:  CXR negative, afebrile, COVID neg. WBC up 15k which has since resolved. Per primary team 4. Hyperkalemia:  resolved 5.  Hypertension/volume: no volume overloaded. uf as tolerated. Will start with coreg 12.5mg  BID 6.  Anemia: hgb 8.2. mircera 280mcg on 8/17. S/p 1u prbc post catheter  placement 7.  Metabolic bone disease:  Calcium low but likely close to goal corrected. Continue calcitriol and renvela.  8.  Nutrition:  Alb low, recommend protein supplement  Gean Quint, MD [place] 08/24/2019, 4:00 PM

## 2019-08-26 NOTE — Progress Notes (Signed)
PROGRESS NOTE  Jorge [name] JHE:[id number] DOB: 1970-12-12 DOA: 08/21/2019 PCP: Lauree Chandler, NP   LOS: 4 days   Brief narrative: As per HPI, 49 year old male with history of type 2 diabetes on insulin, gastroparesis, ESRD on peritoneal dialysis, history of peripheral artery disease status post bilateral below-knee amputation, anemia of chronic disease and protein calorie malnutrition presented to the emergency room for evaluation of feeling poorly for couple of days to presentation prior.  He was not using his peritoneal dialysis for at least 4 days.  He was also having nausea, vomiting and epigastric discomfort and no appetite.  At the emergency room, he was found with significant electrolyte abnormalities, anion gap metabolic acidosis and hyperkalemia with potassium 5.9.  Creatinine was 24.7.  Patient was then admitted to hospital for further evaluation and treatment.  Assessment/Plan:  Principal Problem:   DKA (diabetic ketoacidoses) ([name]) Active Problems:   End-stage renal disease needing dialysis ([place])   ESRD on peritoneal dialysis ([place])   Gastroparesis due to DM ([place])   Hyperkalemia   Hypertensive urgency   Hypotension  Diabetic ketoacidosis: Resolved at this time.  Patient was started on IV insulin drip.  Currently on long-acting insulin and prandial insulin.  Receiving Lantus 8 units at night time 2 units with meals and sliding scale insulin.  Latest POC glucose of 92.  Diabetic coordinator on board.  Will adjust insulin as necessary.  ESRD on peritoneal dialysis: Failed to adequately remove volume.  Nephrology on board and patient underwent temporary hemodialysis catheter placement on 08/24/2019.  Plan is to transition to hemodialysis as outpatient. CLIP in progress.  Patient is thinking about long-term vascular access.  Continue aspirin Lipitor calcitriol.  Next hemodialysis session on 08/27/2019.  Outpatient hemodialysis  on discharge  Hypertensive Urgency:  presentation  BP >170. Not on treatment ay home.  Has been started on Coreg 12.5 twice daily.  As needed hydralazine.  Hyperkalemia: Improved with dialysis.  Monitor BMP closely.  DVT prophylaxis: heparin injection 5,000 Units Start: 08/22/19 1400     Code Status: Full code  Family Communication: None  Status is: Inpatient  Remains inpatient appropriate because:IV treatments appropriate due to intensity of illness or inability to take PO, Inpatient level of care appropriate due to severity of illness and Need for outpatient hemodialysis set up   [name]: The patient is from: Home              Anticipated d/c is to: Home              Anticipated d/c date is: 2 to 3 days              Patient currently is not medically stable to d/c.   Consultants:  Nephrology  Procedures:  Hemodialysis  Right internal jugular temporary hemodialysis catheter placement on 08/24/2019  Antibiotics:  . None  Anti-infectives (From admission, onward)   Start     Dose/Rate Route Frequency Ordered Stop   08/24/19 0800  ceFAZolin (ANCEF) IVPB 2g/100 mL premix  Status:  Discontinued       Note to Pharmacy: Send with pt to OR   2 g 200 mL/hr over 30 Minutes Intravenous On call 08/23/19 0810 08/23/19 1005   08/24/19 0800  ceFAZolin (ANCEF) IVPB 2g/100 mL premix       Note to Pharmacy: Send with pt to OR   2 g 200 mL/hr over 30 Minutes Intravenous To Surgery 08/23/19 1005 08/24/19 1025   08/24/19 0600  cefUROXime (ZINACEF) 1.5  g in sodium chloride 0.9 % 100 mL IVPB  Status:  Discontinued        1.5 g 200 mL/hr over 30 Minutes Intravenous On call to O.R. 08/23/19 0919 08/23/19 1002     Subjective: Today, patient was seen and examined at bedside.  Complains of feeling okay.  Denies any nausea vomiting fever or chills.  Objective: Vitals:   08/26/19 0531 08/26/19 0736  BP: (!) 181/88 (!) 181/93  Pulse: 85 86  Resp: 15 16  Temp: 98.8 F (37.1 C) 98.1 F (36.7 C)  SpO2: 99% 100%    Intake/Output  Summary (Last 24 hours) at 08/26/2019 0942 Last data filed at 08/25/2019 2200 Gross per 24 hour  Intake 200 ml  Output 222 ml  Net -22 ml   Filed Weights   08/25/19 0500 08/25/19 1010 08/26/19 0500  Weight: 77.5 kg 78 kg 79 kg   Body mass index is 24.29 kg/m.   Physical Exam: GENERAL: Patient is alert awake and oriented. Not in obvious distress. HENT: No scleral pallor or icterus. Pupils equally reactive to light. Oral mucosa is moist NECK: is supple, no gross swelling noted.  Right internal jugular hemodialysis catheter in place CHEST: Clear to auscultation. No crackles or wheezes.  Diminished breath sounds bilaterally. CVS: S1 and S2 heard, no murmur. Regular rate and rhythm.  ABDOMEN: Soft, non-tender, bowel sounds are present. EXTREMITIES: Bilateral below-knee amputation CNS: Cranial nerves are intact. No focal motor deficits. SKIN: warm and dry without rashes.  Data Review: I have personally reviewed the following laboratory data and studies,  CBC: Recent Labs  Lab 08/21/19 1805 08/22/19 1200 08/24/19 0114 08/25/19 0249 08/26/19 0619  WBC 10.7* 15.2* 9.1 8.3 8.5  HGB 9.2* 9.3* 7.0* 8.2* 8.2*  HCT 29.2* 29.1* 21.7* 25.8* 25.9*  MCV 89.3 87.4 86.1 89.3 89.6  PLT 153 146* 123* 128* 440*   Basic Metabolic Panel: Recent Labs  Lab 08/23/19 1519 08/23/19 2015 08/24/19 0114 08/25/19 0249 08/26/19 0619  NA 136 135 135 135 133*  K 4.4 4.8 4.5 4.6 4.0  CL 98 97* 97* 99 98  CO2 16* 17* 17* 15* 20*  GLUCOSE 210* 142* 113* 135* 74  BUN 116* 118* 116* 120* 66*  CREATININE 21.90* 22.06* 22.22* 21.22* 13.41*  CALCIUM 7.3* 7.1* 7.1* 6.7* 7.0*  PHOS  --   --  11.3* 10.7* 6.8*   Liver Function Tests: Recent Labs  Lab 08/24/19 0114 08/25/19 0249 08/26/19 0619  ALBUMIN 2.2* 2.2* 2.1*   No results for input(s): LIPASE, AMYLASE in the last 168 hours. No results for input(s): AMMONIA in the last 168 hours. Cardiac Enzymes: No results for input(s): CKTOTAL, CKMB,  CKMBINDEX, TROPONINI in the last 168 hours. BNP (last 3 results) No results for input(s): BNP in the last 8760 hours.  ProBNP (last 3 results) No results for input(s): PROBNP in the last 8760 hours.  CBG: Recent Labs  Lab 08/25/19 1136 08/25/19 1625 08/25/19 2124 08/26/19 0618 08/26/19 0641  GLUCAP 100* 128* 148* 66* 92   Recent Results (from the past 240 hour(s))  SARS Coronavirus 2 by RT PCR (hospital order, performed in [facility] hospital lab) Nasopharyngeal Nasopharyngeal Swab     Status: None   Collection Time: 08/22/19  7:40 AM   Specimen: Nasopharyngeal Swab  Result Value Ref Range Status   SARS Coronavirus 2 NEGATIVE NEGATIVE Final    Comment: (NOTE) SARS-CoV-2 target nucleic acids are NOT DETECTED.  The SARS-CoV-2 RNA is generally detectable in upper and  lower respiratory specimens during the acute phase of infection. The lowest concentration of SARS-CoV-2 viral copies this assay can detect is 250 copies / mL. A negative result does not preclude SARS-CoV-2 infection and should not be used as the sole basis for treatment or other patient management decisions.  A negative result may occur with improper specimen collection / handling, submission of specimen other than nasopharyngeal swab, presence of viral mutation(s) within the areas targeted by this assay, and inadequate number of viral copies (<250 copies / mL). A negative result must be combined with clinical observations, patient history, and epidemiological information.  Fact Sheet for Patients:   StrictlyIdeas.no  Fact Sheet for Healthcare Providers: BankingDealers.co.za  This test is not yet approved or  cleared by the [place] FDA and has been authorized for detection and/or diagnosis of SARS-CoV-2 by FDA under an Emergency Use Authorization (EUA).  This EUA will remain in effect (meaning this test can be used) for the duration of the COVID-19  declaration under Section 564(b)(1) of the Act, 21 U.S.C. section 360bbb-3(b)(1), unless the authorization is terminated or revoked sooner.  Performed at [facility], [place] [address] [place], [place] [address]   Surgical pcr screen     Status: None   Collection Time: 08/24/19 12:33 AM   Specimen: Nasal Mucosa; Nasal Swab  Result Value Ref Range Status   MRSA, PCR NEGATIVE NEGATIVE Final   Staphylococcus aureus NEGATIVE NEGATIVE Final    Comment: (NOTE) The Xpert SA Assay (FDA approved for NASAL specimens in patients 30 years of age and older), is one component of a comprehensive surveillance program. It is not intended to diagnose infection nor to guide or monitor treatment. Performed at [facility], [place] [address], [place], [place] [address]      Studies: DG Chest [place] 1V same Day  Result Date: 08/24/2019 CLINICAL DATA:  Status post dialysis catheter placement. EXAM: PORTABLE CHEST 1 VIEW COMPARISON:  August 22, 2019 FINDINGS: A left-sided venous catheter is seen with its distal tip noted within the right atrium. This is approximately 2.7 cm distal to the junction of the superior vena cava and right atrium. Mild atelectasis is seen within the left lung base. There is no evidence of a pleural effusion or pneumothorax. The heart size and mediastinal contours are within normal limits. The visualized skeletal structures are unremarkable. IMPRESSION: 1. Left-sided venous catheter positioning, as described above. 2. Mild left basilar atelectasis. Electronically Signed   By: Virgina Norfolk M.D.   On: 08/24/2019 15:13   DG Fluoro Guide CV Line-No Report  Result Date: 08/24/2019 Fluoroscopy was utilized by the requesting physician.  No radiographic interpretation.      Flora Lipps, MD  [facility] 08/26/2019

## 2019-08-27 ENCOUNTER — Inpatient Hospital Stay (HOSPITAL_COMMUNITY): Payer: Medicare Other

## 2019-08-27 DIAGNOSIS — N186 End stage renal disease: Secondary | ICD-10-CM

## 2019-08-27 LAB — CBC
HCT: 27.8 % — ABNORMAL LOW (ref 39.0–52.0)
Hemoglobin: 8.8 g/dL — ABNORMAL LOW (ref 13.0–17.0)
MCH: 28.5 pg (ref 26.0–34.0)
MCHC: 31.7 g/dL (ref 30.0–36.0)
MCV: 90 fL (ref 80.0–100.0)
Platelets: 126 10*3/uL — ABNORMAL LOW (ref 150–400)
RBC: 3.09 MIL/uL — ABNORMAL LOW (ref 4.22–5.81)
RDW: 18.1 % — ABNORMAL HIGH (ref 11.5–15.5)
WBC: 9.1 10*3/uL (ref 4.0–10.5)
nRBC: 0 % (ref 0.0–0.2)

## 2019-08-27 LAB — GLUCOSE, CAPILLARY
Glucose-Capillary: 64 mg/dL — ABNORMAL LOW (ref 70–99)
Glucose-Capillary: 114 mg/dL — ABNORMAL HIGH (ref 70–99)
Glucose-Capillary: 152 mg/dL — ABNORMAL HIGH (ref 70–99)
Glucose-Capillary: 142 mg/dL — ABNORMAL HIGH (ref 70–99)

## 2019-08-27 LAB — COMPREHENSIVE METABOLIC PANEL
ALT: 7 U/L (ref 0–44)
AST: 27 U/L (ref 15–41)
Albumin: 2.2 g/dL — ABNORMAL LOW (ref 3.5–5.0)
Alkaline Phosphatase: 52 U/L (ref 38–126)
Anion gap: 14 (ref 5–15)
BUN: 71 mg/dL — ABNORMAL HIGH (ref 6–20)
CO2: 20 mmol/L — ABNORMAL LOW (ref 22–32)
Calcium: 7.5 mg/dL — ABNORMAL LOW (ref 8.9–10.3)
Chloride: 98 mmol/L (ref 98–111)
Creatinine, Ser: 14.19 mg/dL — ABNORMAL HIGH (ref 0.61–1.24)
GFR calc Af Amer: 4 mL/min — ABNORMAL LOW (ref >60)
GFR calc non Af Amer: 4 mL/min — ABNORMAL LOW (ref >60)
Glucose, Bld: 112 mg/dL — ABNORMAL HIGH (ref 70–99)
Potassium: 4.7 mmol/L (ref 3.5–5.1)
Sodium: 132 mmol/L — ABNORMAL LOW (ref 135–145)
Total Bilirubin: 0.3 mg/dL (ref 0.3–1.2)
Total Protein: 5.5 g/dL — ABNORMAL LOW (ref 6.5–8.1)

## 2019-08-27 LAB — MAGNESIUM: Magnesium: 1.8 mg/dL (ref 1.7–2.4)

## 2019-08-27 LAB — PHOSPHORUS: Phosphorus: 6.6 mg/dL — ABNORMAL HIGH (ref 2.5–4.6)

## 2019-08-27 MED ORDER — SODIUM CHLORIDE 0.9 % IV SOLN
1.5000 g | INTRAVENOUS | Status: AC
  Administered 2019-08-28: 1.5 g via INTRAVENOUS
  Filled 2019-08-27 (×2): qty 1.5

## 2019-08-27 MED ORDER — HEPARIN SODIUM (PORCINE) 1000 UNIT/ML IJ SOLN
INTRAMUSCULAR | Status: AC
  Filled 2019-08-27: qty 5

## 2019-08-27 MED ORDER — AMLODIPINE BESYLATE 5 MG PO TABS
5.0000 mg | ORAL_TABLET | Freq: Every day | ORAL | Status: DC
  Administered 2019-08-27 – 2019-08-29 (×3): 5 mg via ORAL
  Filled 2019-08-27 (×3): qty 1

## 2019-08-27 MED ORDER — HEPARIN SODIUM (PORCINE) 1000 UNIT/ML IJ SOLN
4200.0000 [IU] | Freq: Once | INTRAMUSCULAR | Status: AC
  Administered 2019-08-27: 4200 [IU]

## 2019-08-27 MED ORDER — HYDROMORPHONE HCL 1 MG/ML IJ SOLN
1.0000 mg | INTRAMUSCULAR | Status: AC | PRN
  Administered 2019-08-27 – 2019-08-28 (×4): 1 mg via INTRAVENOUS
  Filled 2019-08-27 (×4): qty 1

## 2019-08-27 NOTE — Progress Notes (Signed)
Per Dr. Candiss Norse, patient has agreed to permanent access surgery prior to discharge and, therefore, he will be accepted at [facility] once access is placed. Per clinic Charge RN, he will be on a TTS schedule. Navigator awaiting seat time. Navigator will follow closely.  Alphonzo Cruise, [place] [facility] Navigator [phone]

## 2019-08-27 NOTE — Progress Notes (Signed)
Per Dr. Remus Blake Director at [facility], patient will not be accepted without a permanent access. This is the closest clinic to patient's home. The next closest clinic with a potential opening is [facility] and the MD there will also not accept without AVF/G. The next closest clinic, which may be able to accommodate patient is [facility], which is approximately 25 minutes away from patient's home. Navigator updated inpatient Nephrologist/Dr. Candiss Norse who will speak with patient again about access placement and let Navigator know what he decides.  Alphonzo Cruise, [place] [facility] Navigator [phone]

## 2019-08-27 NOTE — Progress Notes (Addendum)
[facility]  Subjective  - He has decided to go ahead with UE access.   Objective (!) 172/90 71 97.9 F (36.6 C) (Oral) 18 100%  Intake/Output Summary (Last 24 hours) at 08/27/2019 0949 Last data filed at 08/27/2019 8546 Gross per 24 hour  Intake --  Output 451 ml  Net -451 ml   Left TDC working well B UE with palpable radial pulses Lungs non labored breathing   Assessment/Planning: ESRD with recent history of PD failure.  TDC was placed by Dr. Donnetta Hutching on 8/16.  He has now consented to UE access.  He denies previous arm access.  He is left hand dominant.  I have ordered vein mapping.  He has limited HD centers where he lives and will not be accepted without a permanent access, so this will need to be done prior to discharge home I think.    Roxy Horseman 08/27/2019 9:49 AM --  Laboratory Lab Results: Recent Labs    08/26/19 0619 08/27/19 0237  WBC 8.5 9.1  HGB 8.2* 8.8*  HCT 25.9* 27.8*  PLT 124* 126*   BMET Recent Labs    08/26/19 0619 08/27/19 0237  NA 133* 132*  K 4.0 4.7  CL 98 98  CO2 20* 20*  GLUCOSE 74 112*  BUN 66* 71*  CREATININE 13.41* 14.19*  CALCIUM 7.0* 7.5*    COAG No results found for: INR, PROTIME No results found for: PTT  I have independently interviewed and examined patient and agree with PA assessment and plan above.  Patient now agreeable to proceeding with upper extremity access.  He is left-hand dominant.  We will get vein mapping today.  He will be n.p.o. past midnight and plan for upper extremity AV fistula or graft the site and side to be determined following vein mapping.  Leandre Wien C. Donzetta Matters, MD [facility] Office: [phone] Pager: [phone]

## 2019-08-27 NOTE — Progress Notes (Signed)
[facility] Progress Note  Subjective:  Still hypertensive, feels well, tolerated coreg. No complaints. Had an extensive disussion about avf/avg placement and disposition issues along with eventual plans to transition to home dialysis. Is now willing to get it done. HD later today.  Vitals:   08/26/19 2339 08/27/19 0500 08/27/19 0545 08/27/19 0734  BP: (!) 150/96  (!) 173/78 (!) 172/90  Pulse: 79  75 71  Resp: 18  12 18   Temp: 98 F (36.7 C)  98.6 F (37 C) 97.9 F (36.6 C)  TempSrc: Oral  Oral Oral  SpO2: 100%  100% 100%  Weight:  79.8 kg    Height:        Exam: Gen: NAD, sitting up in bed Resp: cta bl, no w/r/r/c, unlabored, bl chest expansion CV: s1s2, tachycardic Abd: soft, nt, pd cath c/d/i Ext: bl bka, no dependent edema Neuro: awake, speech clear and coherent, moves all extremities spontaneously HD access: lij tdc c/d/i  Recent Labs  Lab 08/26/19 0619 08/27/19 0237  K 4.0 4.7  BUN 66* 71*  CREATININE 13.41* 14.19*  CALCIUM 7.0* 7.5*  PHOS 6.8* 6.6*  HGB 8.2* 8.8*   Inpatient medications:  (feeding supplement) PROSource Plus  30 mL Oral TID BM   aspirin EC  81 mg Oral Daily   atorvastatin  20 mg Oral Daily   calcitRIOL  0.5 mcg Oral Once per day on Mon Wed Fri   calcium carbonate  1 tablet Oral TID WC   carvedilol  12.5 mg Oral BID WC   Chlorhexidine Gluconate Cloth  6 each Topical Daily   darbepoetin (ARANESP) injection - DIALYSIS  200 mcg Intravenous Q Tue-HD   gentamicin cream  1 application Topical Daily   heparin  5,000 Units Subcutaneous Q8H   insulin aspart  0-6 Units Subcutaneous TID WC   insulin aspart  2 Units Subcutaneous TID WC   insulin glargine  7 Units Subcutaneous QHS   multivitamin  1 tablet Oral Daily   pantoprazole  40 mg Oral Daily   sevelamer carbonate  3,200 mg Oral TID WC    dextrose, dextrose, gabapentin, hydrALAZINE, HYDROmorphone (DILAUDID) injection  Outpatient Center Orders: PD days per  week, Nephrologist Dr. Joelyn Oms  EDW 68.5 (kg) (changed to 74kg?) Ca 2.5 (mEq/L) Mg 0.5 (mEq/L) Dextrose 1.5; 2.5; 4.25 %, # Exchanges 5, fil vol 1800 mL, Dwell Time 1 hrs 30 min, last fill vol 0 mL, Day Exchanges 0, Day Dwell Time 0 hrs 0 min    Assessment/Plan: 1. ESRD:  Has not completed PD last 4 days at home, here is azotemic w/ some symptoms of uremia including fatigue, nausea and hiccups, but otherwise stable w/o confusion or myoclonus. Did PD while in-house with concerns of clogged PD catheter which is to be removed as an outpatient.  Not having adequate clearance with PD.  Transitioning to HD.  Status post tunneled dialysis catheter placement on 8/16 1. S/p hd 8/17, next HD today 2. After our extensive discussion today about permanent access (AVF/AVG) and explaining the risks of catheters, disposition planning, and the possibility of doing home HD in the future, patient is now willing to get an AVF/AVG. Will re-engage V[facility]. Patient also has had time to think about it and is more willing to get it done while in-house. 3. Outpatient dialysis planning, started clip 2. Hypotension: resolved 3.  Cough/SOB:  CXR negative, afebrile, COVID neg. WBC up 15k which has since resolved. Per primary team 4. Hyperkalemia:  resolved 5.  Hypertension/volume: not volume overloaded. uf as tolerated. Started coreg 12.5mg  BID, will start with norvasc 5mg  daily 6.  Anemia: hgb 8.8. mircera 245mcg on 8/17. S/p 1u prbc post catheter placement 7.  Metabolic bone disease:  Calcium low but likely close to goal corrected. Continue calcitriol and renvela.  8.  Nutrition:  Alb low, recommend protein supplement  Gean Quint, MD [facility]

## 2019-08-27 NOTE — Progress Notes (Signed)
PROGRESS NOTE  Jorge [name] VPX:[id number] DOB: 1970/01/16 DOA: 08/21/2019 PCP: Lauree Chandler, NP   LOS: 5 days   Brief narrative: As per HPI, 49 year old male with history of type 2 diabetes on insulin, gastroparesis, ESRD on peritoneal dialysis, history of peripheral artery disease status post bilateral below-knee amputation, anemia of chronic disease and protein calorie malnutrition presented to the emergency room for evaluation of feeling poorly for couple of days to presentation prior.  He was not using his peritoneal dialysis for at least 4 days.  He was also having nausea, vomiting and epigastric discomfort and no appetite.  At the emergency room, he was found with significant electrolyte abnormalities, anion gap metabolic acidosis and hyperkalemia with potassium 5.9.  Creatinine was 24 potassium of 4.7 today .7.  Patient was then admitted to hospital for further evaluation and treatment.  Assessment/Plan:  Principal Problem:   DKA (diabetic ketoacidoses) ([name]) Active Problems:   End-stage renal disease needing dialysis ([place])   ESRD on peritoneal dialysis ([place])   Gastroparesis due to DM ([place])   Hyperkalemia   Hypertensive urgency   Hypotension  Diabetic ketoacidosis: Resolved at this time.  On  Lantus 7 units at night time 2 units with meals and low dose sliding scale insulin.  Latest POC glucose of 114.  Diabetic coordinator on board.   ESRD on peritoneal dialysis: Failed to adequately remove volume.  Nephrology on board and patient underwent temporary hemodialysis catheter placement on 08/24/2019.  Plan is to transition to hemodialysis as outpatient. CLIP in progress. Vascular surgery on board for long term  vascular access.  Continue aspirin, Lipitor calcitriol.  For hemodialysis session on 08/27/2019.  Outpatient hemodialysis  on discharge  Hypertensive Urgency:  presentation BP >170. Not on treatment ay home.  Has been started on Coreg 12.5 twice daily.  As needed  hydralazine.  Hyperkalemia: Improved with dialysis.  Monitor BMP closely.  Serum potassium of 4.7 today  DVT prophylaxis: heparin injection 5,000 Units Start: 08/22/19 1400    Code Status: Full code  Family Communication: None  Status is: Inpatient  Remains inpatient appropriate because:IV treatments appropriate due to intensity of illness or inability to take PO, Inpatient level of care appropriate due to severity of illness and Need for outpatient hemodialysis set up, awaiting vascular access placement   [name]: The patient is from: Home              Anticipated d/c is to: Home              Anticipated d/c date is: 2 to 3 days, awaiting outpatient hemodialysis set up              Patient currently is medically stable to d/c.  Consultants:  Nephrology  Procedures:  Hemodialysis  Right internal jugular temporary hemodialysis catheter placement on 08/24/2019  Antibiotics:  . None  Subjective:  Today, patient was seen and examined at bedside.  Denies any nausea, vomiting, abdominal pain, fever or chills.   Objective: Vitals:   08/27/19 0734 08/27/19 1255  BP: (!) 172/90 (!) 172/96  Pulse: 71 78  Resp: 18 18  Temp: 97.9 F (36.6 C) 97.8 F (36.6 C)  SpO2: 100% 100%    Intake/Output Summary (Last 24 hours) at 08/27/2019 1359 Last data filed at 08/27/2019 0900 Gross per 24 hour  Intake 120 ml  Output 451 ml  Net -331 ml   Filed Weights   08/25/19 1010 08/26/19 0500 08/27/19 0500  Weight: 78 kg 79 kg  79.8 kg   Body mass index is 24.54 kg/m.   Physical Exam:  GENERAL: Patient is alert awake and oriented. Not in obvious distress. HENT: No scleral pallor or icterus. Pupils equally reactive to light. Oral mucosa is moist NECK: is supple, no gross swelling noted.  Right internal jugular hemodialysis catheter in place CHEST: Clear to auscultation. No crackles or wheezes.  Diminished breath sounds bilaterally. CVS: S1 and S2 heard, no murmur. Regular rate and  rhythm.  ABDOMEN: Soft, non-tender, bowel sounds are present. EXTREMITIES: Bilateral below-knee amputation CNS: Cranial nerves are intact. No focal motor deficits. SKIN: warm and dry without rashes.  Data Review: I have personally reviewed the following laboratory data and studies,  CBC: Recent Labs  Lab 08/22/19 1200 08/24/19 0114 08/25/19 0249 08/26/19 0619 08/27/19 0237  WBC 15.2* 9.1 8.3 8.5 9.1  HGB 9.3* 7.0* 8.2* 8.2* 8.8*  HCT 29.1* 21.7* 25.8* 25.9* 27.8*  MCV 87.4 86.1 89.3 89.6 90.0  PLT 146* 123* 128* 124* 756*   Basic Metabolic Panel: Recent Labs  Lab 08/23/19 2015 08/24/19 0114 08/25/19 0249 08/26/19 0619 08/27/19 0237  NA 135 135 135 133* 132*  K 4.8 4.5 4.6 4.0 4.7  CL 97* 97* 99 98 98  CO2 17* 17* 15* 20* 20*  GLUCOSE 142* 113* 135* 74 112*  BUN 118* 116* 120* 66* 71*  CREATININE 22.06* 22.22* 21.22* 13.41* 14.19*  CALCIUM 7.1* 7.1* 6.7* 7.0* 7.5*  MG  --   --   --   --  1.8  PHOS  --  11.3* 10.7* 6.8* 6.6*   Liver Function Tests: Recent Labs  Lab 08/24/19 0114 08/25/19 0249 08/26/19 0619 08/27/19 0237  AST  --   --   --  27  ALT  --   --   --  7  ALKPHOS  --   --   --  52  BILITOT  --   --   --  0.3  PROT  --   --   --  5.5*  ALBUMIN 2.2* 2.2* 2.1* 2.2*   Recent Labs  Lab 08/26/19 2221  LIPASE 23   No results for input(s): AMMONIA in the last 168 hours. Cardiac Enzymes: No results for input(s): CKTOTAL, CKMB, CKMBINDEX, TROPONINI in the last 168 hours. BNP (last 3 results) No results for input(s): BNP in the last 8760 hours.  ProBNP (last 3 results) No results for input(s): PROBNP in the last 8760 hours.  CBG: Recent Labs  Lab 08/26/19 1102 08/26/19 1627 08/26/19 2126 08/27/19 0626 08/27/19 1252  GLUCAP 153* 79 136* 64* 114*   Recent Results (from the past 240 hour(s))  SARS Coronavirus 2 by RT PCR (hospital order, performed in [facility] hospital lab) Nasopharyngeal Nasopharyngeal Swab     Status: None   Collection  Time: 08/22/19  7:40 AM   Specimen: Nasopharyngeal Swab  Result Value Ref Range Status   SARS Coronavirus 2 NEGATIVE NEGATIVE Final    Comment: (NOTE) SARS-CoV-2 target nucleic acids are NOT DETECTED.  The SARS-CoV-2 RNA is generally detectable in upper and lower respiratory specimens during the acute phase of infection. The lowest concentration of SARS-CoV-2 viral copies this assay can detect is 250 copies / mL. A negative result does not preclude SARS-CoV-2 infection and should not be used as the sole basis for treatment or other patient management decisions.  A negative result may occur with improper specimen collection / handling, submission of specimen other than nasopharyngeal swab, presence of viral mutation(s) within  the areas targeted by this assay, and inadequate number of viral copies (<250 copies / mL). A negative result must be combined with clinical observations, patient history, and epidemiological information.  Fact Sheet for Patients:   StrictlyIdeas.no  Fact Sheet for Healthcare Providers: BankingDealers.co.za  This test is not yet approved or  cleared by the [place] FDA and has been authorized for detection and/or diagnosis of SARS-CoV-2 by FDA under an Emergency Use Authorization (EUA).  This EUA will remain in effect (meaning this test can be used) for the duration of the COVID-19 declaration under Section 564(b)(1) of the Act, 21 U.S.C. section 360bbb-3(b)(1), unless the authorization is terminated or revoked sooner.  Performed at [facility], [place] [address], [place], [place] [address]   Surgical pcr screen     Status: None   Collection Time: 08/24/19 12:33 AM   Specimen: Nasal Mucosa; Nasal Swab  Result Value Ref Range Status   MRSA, PCR NEGATIVE NEGATIVE Final   Staphylococcus aureus NEGATIVE NEGATIVE Final    Comment: (NOTE) The Xpert SA Assay (FDA approved for NASAL specimens in patients  40 years of age and older), is one component of a comprehensive surveillance program. It is not intended to diagnose infection nor to guide or monitor treatment. Performed at [facility], [place] [address], [place], [place] [address]      Studies: No results found.    Flora Lipps, MD  [facility] 08/27/2019

## 2019-08-28 ENCOUNTER — Encounter (HOSPITAL_COMMUNITY)
Admission: EM | Disposition: A | Discharge status: Home / Self Care | Payer: Self-pay | Source: Home / Self Care | Attending: Internal Medicine

## 2019-08-28 ENCOUNTER — Ambulatory Visit: Payer: Medicare Other

## 2019-08-28 ENCOUNTER — Encounter (HOSPITAL_COMMUNITY): Payer: Self-pay | Admitting: Internal Medicine

## 2019-08-28 ENCOUNTER — Inpatient Hospital Stay (HOSPITAL_COMMUNITY): Payer: Medicare Other | Admitting: Certified Registered Nurse Anesthetist

## 2019-08-28 DIAGNOSIS — Z87892 Personal history of anaphylaxis: Secondary | ICD-10-CM | POA: Insufficient documentation

## 2019-08-28 DIAGNOSIS — R0602 Shortness of breath: Secondary | ICD-10-CM | POA: Insufficient documentation

## 2019-08-28 DIAGNOSIS — R509 Fever, unspecified: Secondary | ICD-10-CM | POA: Insufficient documentation

## 2019-08-28 DIAGNOSIS — L299 Pruritus, unspecified: Secondary | ICD-10-CM | POA: Insufficient documentation

## 2019-08-28 HISTORY — PX: BASCILIC VEIN TRANSPOSITION: SHX5742

## 2019-08-28 LAB — GLUCOSE, CAPILLARY
Glucose-Capillary: 58 mg/dL — ABNORMAL LOW (ref 70–99)
Glucose-Capillary: 60 mg/dL — ABNORMAL LOW (ref 70–99)
Glucose-Capillary: 62 mg/dL — ABNORMAL LOW (ref 70–99)
Glucose-Capillary: 153 mg/dL — ABNORMAL HIGH (ref 70–99)
Glucose-Capillary: 101 mg/dL — ABNORMAL HIGH (ref 70–99)
Glucose-Capillary: 104 mg/dL — ABNORMAL HIGH (ref 70–99)
Glucose-Capillary: 103 mg/dL — ABNORMAL HIGH (ref 70–99)

## 2019-08-28 LAB — CBC
HCT: 25.2 % — ABNORMAL LOW (ref 39.0–52.0)
Hemoglobin: 7.8 g/dL — ABNORMAL LOW (ref 13.0–17.0)
MCH: 27.9 pg (ref 26.0–34.0)
MCHC: 31 g/dL (ref 30.0–36.0)
MCV: 90 fL (ref 80.0–100.0)
Platelets: 120 10*3/uL — ABNORMAL LOW (ref 150–400)
RBC: 2.8 MIL/uL — ABNORMAL LOW (ref 4.22–5.81)
RDW: 17.9 % — ABNORMAL HIGH (ref 11.5–15.5)
WBC: 7.6 10*3/uL (ref 4.0–10.5)
nRBC: 0.4 % — ABNORMAL HIGH (ref 0.0–0.2)

## 2019-08-28 LAB — BASIC METABOLIC PANEL
Anion gap: 9 (ref 5–15)
BUN: 39 mg/dL — ABNORMAL HIGH (ref 6–20)
CO2: 23 mmol/L (ref 22–32)
Calcium: 7.8 mg/dL — ABNORMAL LOW (ref 8.9–10.3)
Chloride: 101 mmol/L (ref 98–111)
Creatinine, Ser: 9.08 mg/dL — ABNORMAL HIGH (ref 0.61–1.24)
GFR calc Af Amer: 7 mL/min — ABNORMAL LOW (ref >60)
GFR calc non Af Amer: 6 mL/min — ABNORMAL LOW (ref >60)
Glucose, Bld: 93 mg/dL (ref 70–99)
Potassium: 4.4 mmol/L (ref 3.5–5.1)
Sodium: 133 mmol/L — ABNORMAL LOW (ref 135–145)

## 2019-08-28 SURGERY — TRANSPOSITION, VEIN, BASILIC
Anesthesia: General | Site: Arm Upper | Laterality: Right

## 2019-08-28 MED ORDER — ONDANSETRON HCL 4 MG/2ML IJ SOLN
INTRAMUSCULAR | Status: DC | PRN
  Administered 2019-08-28: 4 mg via INTRAVENOUS

## 2019-08-28 MED ORDER — HYDROCODONE-ACETAMINOPHEN 5-325 MG PO TABS
1.0000 | ORAL_TABLET | Freq: Four times a day (QID) | ORAL | Status: DC | PRN
  Administered 2019-08-28: 2 via ORAL
  Filled 2019-08-28: qty 2

## 2019-08-28 MED ORDER — LIDOCAINE 20MG/ML (2%) 15 ML SYRINGE OPTIME
INTRAMUSCULAR | Status: DC | PRN
  Administered 2019-08-28: 40 mg via INTRAVENOUS

## 2019-08-28 MED ORDER — SODIUM CHLORIDE 0.9 % IV SOLN
INTRAVENOUS | Status: DC | PRN
  Administered 2019-08-28: 500 mL

## 2019-08-28 MED ORDER — PROPOFOL 10 MG/ML IV BOLUS
INTRAVENOUS | Status: DC | PRN
  Administered 2019-08-28: 100 mg via INTRAVENOUS
  Administered 2019-08-28: 20 mg via INTRAVENOUS

## 2019-08-28 MED ORDER — PHENYLEPHRINE HCL-NACL 10-0.9 MG/250ML-% IV SOLN
INTRAVENOUS | Status: DC | PRN
  Administered 2019-08-28: 50 ug/min via INTRAVENOUS

## 2019-08-28 MED ORDER — OXYCODONE-ACETAMINOPHEN 5-325 MG PO TABS
1.0000 | ORAL_TABLET | Freq: Four times a day (QID) | ORAL | Status: DC | PRN
  Administered 2019-08-28 – 2019-08-29 (×2): 2 via ORAL
  Filled 2019-08-28 (×3): qty 2

## 2019-08-28 MED ORDER — CARVEDILOL 12.5 MG PO TABS
12.5000 mg | ORAL_TABLET | Freq: Two times a day (BID) | ORAL | 2 refills | Status: DC
Start: 2019-08-28 — End: 2019-10-16

## 2019-08-28 MED ORDER — 0.9 % SODIUM CHLORIDE (POUR BTL) OPTIME
TOPICAL | Status: DC | PRN
  Administered 2019-08-28: 1000 mL

## 2019-08-28 MED ORDER — AMLODIPINE BESYLATE 5 MG PO TABS
5.0000 mg | ORAL_TABLET | Freq: Every day | ORAL | 2 refills | Status: DC
Start: 2019-08-29 — End: 2019-10-03

## 2019-08-28 MED ORDER — FENTANYL CITRATE (PF) 250 MCG/5ML IJ SOLN
INTRAMUSCULAR | Status: AC
  Filled 2019-08-28: qty 5

## 2019-08-28 MED ORDER — DEXTROSE 50 % IV SOLN
INTRAVENOUS | Status: AC
  Filled 2019-08-28: qty 50

## 2019-08-28 MED ORDER — MIDAZOLAM HCL 2 MG/2ML IJ SOLN
INTRAMUSCULAR | Status: AC
  Filled 2019-08-28: qty 2

## 2019-08-28 MED ORDER — METOCLOPRAMIDE HCL 10 MG PO TABS: 10.0000 mg | ORAL_TABLET | Freq: Four times a day (QID) | ORAL | Status: DC | PRN

## 2019-08-28 MED ORDER — FENTANYL CITRATE (PF) 100 MCG/2ML IJ SOLN
INTRAMUSCULAR | Status: DC | PRN
Start: 2019-08-28 — End: 2019-08-28
  Administered 2019-08-28: 50 ug via INTRAVENOUS

## 2019-08-28 MED ORDER — LIDOCAINE-EPINEPHRINE 0.5 %-1:200000 IJ SOLN
INTRAMUSCULAR | Status: AC
  Filled 2019-08-28: qty 1

## 2019-08-28 MED ORDER — PHENYLEPHRINE 40 MCG/ML (10ML) SYRINGE FOR IV PUSH (FOR BLOOD PRESSURE SUPPORT)
PREFILLED_SYRINGE | INTRAVENOUS | Status: DC | PRN
  Administered 2019-08-28 (×5): 40 ug via INTRAVENOUS
  Administered 2019-08-28 (×2): 80 ug via INTRAVENOUS
  Administered 2019-08-28: 40 ug via INTRAVENOUS

## 2019-08-28 MED ORDER — MIDAZOLAM HCL 5 MG/5ML IJ SOLN
INTRAMUSCULAR | Status: DC | PRN
  Administered 2019-08-28: 2 mg via INTRAVENOUS

## 2019-08-28 MED ORDER — EPHEDRINE SULFATE-NACL 50-0.9 MG/10ML-% IV SOSY
PREFILLED_SYRINGE | INTRAVENOUS | Status: DC | PRN
  Administered 2019-08-28 (×6): 5 mg via INTRAVENOUS
  Administered 2019-08-28 (×2): 10 mg via INTRAVENOUS

## 2019-08-28 MED ORDER — STERILE WATER FOR IRRIGATION IR SOLN
Status: DC | PRN
  Administered 2019-08-28: 1000 mL

## 2019-08-28 MED ORDER — EPHEDRINE 5 MG/ML INJ
INTRAVENOUS | Status: AC
  Filled 2019-08-28: qty 10

## 2019-08-28 MED ORDER — CHLORHEXIDINE GLUCONATE 0.12 % MT SOLN
OROMUCOSAL | Status: AC
  Filled 2019-08-28: qty 15

## 2019-08-28 MED ORDER — CHLORHEXIDINE GLUCONATE 0.12 % MT SOLN
15.0000 mL | Freq: Once | OROMUCOSAL | Status: AC
  Administered 2019-08-28: 15 mL via OROMUCOSAL

## 2019-08-28 MED ORDER — LIDOCAINE 2% (20 MG/ML) 5 ML SYRINGE
INTRAMUSCULAR | Status: AC
  Filled 2019-08-28: qty 5

## 2019-08-28 MED ORDER — OXYCODONE-ACETAMINOPHEN 5-325 MG PO TABS
1.0000 | ORAL_TABLET | Freq: Four times a day (QID) | ORAL | 0 refills | Status: DC | PRN
Start: 2019-08-28 — End: 2019-10-16

## 2019-08-28 MED ORDER — PHENYLEPHRINE 40 MCG/ML (10ML) SYRINGE FOR IV PUSH (FOR BLOOD PRESSURE SUPPORT)
PREFILLED_SYRINGE | INTRAVENOUS | Status: AC
  Filled 2019-08-28: qty 10

## 2019-08-28 MED ORDER — LIDOCAINE-EPINEPHRINE 0.5 %-1:200000 IJ SOLN
INTRAMUSCULAR | Status: DC | PRN
  Administered 2019-08-28: 10 mL

## 2019-08-28 MED ORDER — SODIUM CHLORIDE 0.9 % IV SOLN
INTRAVENOUS | Status: AC
  Filled 2019-08-28: qty 1.2

## 2019-08-28 MED ORDER — ONDANSETRON HCL 4 MG/2ML IJ SOLN
INTRAMUSCULAR | Status: AC
  Filled 2019-08-28: qty 2

## 2019-08-28 MED ORDER — SODIUM CHLORIDE 0.9 % IV SOLN: INTRAVENOUS | Status: DC

## 2019-08-28 MED ORDER — PROPOFOL 10 MG/ML IV BOLUS
INTRAVENOUS | Status: AC
  Filled 2019-08-28: qty 20

## 2019-08-28 SURGICAL SUPPLY — 39 items
ADH SKN CLS APL DERMABOND .7 (GAUZE/BANDAGES/DRESSINGS) ×2
ARMBAND PINK RESTRICT EXTREMIT (MISCELLANEOUS) ×8 IMPLANT
BNDG ELASTIC 4X5.8 VLCR STR LF (GAUZE/BANDAGES/DRESSINGS) ×3 IMPLANT
BNDG GAUZE ELAST 4 BULKY (GAUZE/BANDAGES/DRESSINGS) ×3 IMPLANT
CANISTER SUCT 3000ML PPV (MISCELLANEOUS) ×4 IMPLANT
CANNULA VESSEL 3MM 2 BLNT TIP (CANNULA) ×4 IMPLANT
CLIP LIGATING EXTRA MED SLVR (CLIP) ×4 IMPLANT
CLIP LIGATING EXTRA SM BLUE (MISCELLANEOUS) ×4 IMPLANT
COVER PROBE W GEL 5X96 (DRAPES) ×4 IMPLANT
COVER WAND RF STERILE (DRAPES) ×1 IMPLANT
DECANTER SPIKE VIAL GLASS SM (MISCELLANEOUS) ×4 IMPLANT
DERMABOND ADVANCED (GAUZE/BANDAGES/DRESSINGS) ×2
DERMABOND ADVANCED .7 DNX12 (GAUZE/BANDAGES/DRESSINGS) ×2 IMPLANT
ELECT REM PT RETURN 9FT ADLT (ELECTROSURGICAL) ×4
ELECTRODE REM PT RTRN 9FT ADLT (ELECTROSURGICAL) ×2 IMPLANT
GLOVE BIO SURGEON STRL SZ7.5 (GLOVE) ×3 IMPLANT
GLOVE BIOGEL PI IND STRL 6.5 (GLOVE) ×1 IMPLANT
GLOVE BIOGEL PI INDICATOR 6.5 (GLOVE) ×2
GLOVE ECLIPSE 7.0 STRL STRAW (GLOVE) ×3 IMPLANT
GLOVE SS BIOGEL STRL SZ 7.5 (GLOVE) ×2 IMPLANT
GLOVE SUPERSENSE BIOGEL SZ 7.5 (GLOVE) ×2
GOWN L4 XLG 20 PK N/S (GOWN DISPOSABLE) ×3 IMPLANT
GOWN STRL REUS W/ TWL LRG LVL3 (GOWN DISPOSABLE) ×6 IMPLANT
GOWN STRL REUS W/TWL LRG LVL3 (GOWN DISPOSABLE) ×12
KIT BASIN OR (CUSTOM PROCEDURE TRAY) ×4 IMPLANT
KIT TURNOVER KIT B (KITS) ×4 IMPLANT
NS IRRIG 1000ML POUR BTL (IV SOLUTION) ×4 IMPLANT
PACK CV ACCESS (CUSTOM PROCEDURE TRAY) ×4 IMPLANT
PAD ARMBOARD 7.5X6 YLW CONV (MISCELLANEOUS) ×8 IMPLANT
SPONGE LAP 18X18 X RAY DECT (DISPOSABLE) ×3 IMPLANT
SUT PROLENE 6 0 CC (SUTURE) ×7 IMPLANT
SUT SILK 2 0 PERMA HAND 18 BK (SUTURE) ×3 IMPLANT
SUT SILK 3 0 (SUTURE) ×4
SUT SILK 3-0 18XBRD TIE 12 (SUTURE) ×1 IMPLANT
SUT VIC AB 3-0 SH 27 (SUTURE) ×12
SUT VIC AB 3-0 SH 27X BRD (SUTURE) ×4 IMPLANT
TOWEL GREEN STERILE (TOWEL DISPOSABLE) ×4 IMPLANT
UNDERPAD 30X36 HEAVY ABSORB (UNDERPADS AND DIAPERS) ×4 IMPLANT
WATER STERILE IRR 1000ML POUR (IV SOLUTION) ×4 IMPLANT

## 2019-08-28 NOTE — Discharge Instructions (Signed)
° °  [facility] ° °Discharge Instructions ° °AV Fistula or Graft Surgery for Dialysis Access ° °Please refer to the following instructions for your post-procedure care. Your surgeon or physician assistant will discuss any changes with you. ° °Activity ° °You may drive the day following your surgery, if you are comfortable and no longer taking prescription pain medication. Resume full activity as the soreness in your incision resolves. ° °Bathing/Showering ° °You may shower after you go home. Keep your incision dry for 48 hours. Do not soak in a bathtub, hot tub, or swim until the incision heals completely. You may not shower if you have a hemodialysis catheter. ° °Incision Care ° °Clean your incision with mild soap and water after 48 hours. Pat the area dry with a clean towel. You do not need a bandage unless otherwise instructed. Do not apply any ointments or creams to your incision. You may have skin glue on your incision. Do not peel it off. It will come off on its own in about one week. Your arm may swell a bit after surgery. To reduce swelling use pillows to elevate your arm so it is above your heart. Your doctor will tell you if you need to lightly wrap your arm with an ACE bandage. ° °Diet ° °Resume your normal diet. There are not special food restrictions following this procedure. In order to heal from your surgery, it is CRITICAL to get adequate nutrition. Your body requires vitamins, minerals, and protein. Vegetables are the best source of vitamins and minerals. Vegetables also provide the perfect balance of protein. Processed food has little nutritional value, so try to avoid this. ° °Medications ° °Resume taking all of your medications. If your incision is causing pain, you may take over-the counter pain relievers such as acetaminophen (Tylenol). If you were prescribed a stronger pain medication, please be aware these medications can cause nausea and constipation. Prevent  nausea by taking the medication with a snack or meal. Avoid constipation by drinking plenty of fluids and eating foods with high amount of fiber, such as fruits, vegetables, and grains. Do not take Tylenol if you are taking prescription pain medications. ° ° ° ° °Follow up °Your surgeon may want to see you in the office following your access surgery. If so, this will be arranged at the time of your surgery. ° °Please call us immediately for any of the following conditions: ° °Increased pain, redness, drainage (pus) from your incision site °Fever of 101 degrees or higher °Severe or worsening pain at your incision site °Hand pain or numbness. ° °Reduce your risk of vascular disease: ° °Stop smoking. If you would like help, call [facility] at 1-800-QUIT-NOW (1-800-784-8669) or [place] at 336-586-4000 ° °Manage your cholesterol °Maintain a desired weight °Control your diabetes °Keep your blood pressure down ° °Dialysis ° °It will take several weeks to several months for your new dialysis access to be ready for use. Your surgeon will determine when it is OK to use it. Your nephrologist will continue to direct your dialysis. You can continue to use your Permcath until your new access is ready for use. ° °If you have any questions, please call the office at [phone]. ° °

## 2019-08-28 NOTE — Progress Notes (Signed)
Renal Navigator provided patient with seat schedule information for OP HD clinic. He reports that his sister will transport him. Navigator spoke with sister/Denise who is agreeable. She is prepared that we may discharge patient later today and that his next HD will be at his OP HD clinic tomorrow-information provided verbally to sister.  Navigator also informed patient and sister of patient's eligibility for Medicaid Transportation and explained that he needs to enroll before he can schedule rides to medical appointments including HD. Patient's sister was very pleasant and very appreciative.   Alphonzo Cruise, [place] [facility] Navigator [phone]

## 2019-08-28 NOTE — Progress Notes (Signed)
  Progress Note    08/28/2019 8:45 AM 4 Days Post-Op  Subjective: No overnight issues  Vitals:   08/28/19 0508 08/28/19 0757  BP:  (!) 159/87  Pulse:  70  Resp: 18 13  Temp:  97.8 F (36.6 C)  SpO2:  98%    Physical Exam: Awake alert oriented Bilateral radial artery pulses are palpable  CBC    Component Value Date/Time   WBC 7.6 08/28/2019 0317   RBC 2.80 (L) 08/28/2019 0317   HGB 7.8 (L) 08/28/2019 0317   HCT 25.2 (L) 08/28/2019 0317   PLT 120 (L) 08/28/2019 0317   MCV 90.0 08/28/2019 0317   MCH 27.9 08/28/2019 0317   MCHC 31.0 08/28/2019 0317   RDW 17.9 (H) 08/28/2019 0317   LYMPHSABS 1.7 04/20/2019 0537   MONOABS 1.0 04/20/2019 0537   EOSABS 0.5 04/20/2019 0537   BASOSABS 0.1 04/20/2019 0537    BMET    Component Value Date/Time   NA 133 (L) 08/28/2019 0317   NA 136 (A) 07/10/2017 0000   K 4.4 08/28/2019 0317   CL 101 08/28/2019 0317   CO2 23 08/28/2019 0317   GLUCOSE 93 08/28/2019 0317   BUN 39 (H) 08/28/2019 0317   BUN 51 (A) 07/10/2017 0000   CREATININE 9.08 (H) 08/28/2019 0317   CALCIUM 7.8 (L) 08/28/2019 0317   GFRNONAA 6 (L) 08/28/2019 0317   GFRAA 7 (L) 08/28/2019 0317    INR No results found for: INR   Intake/Output Summary (Last 24 hours) at 08/28/2019 0845 Last data filed at 08/28/2019 0819 Gross per 24 hour  Intake 120 ml  Output 2600 ml  Net -2480 ml     Assessment/plan:  49 y.o. male is here with now end-stage renal disease status post catheter placement.  He is now amenable to permanent access.  Appears to have suitable right basilic vein he is left-hand dominant.  I discussed placing IV in his left upper extremity we can remove the right hand IV and we will plan for left arm AV fistula versus graft today in the OR.   Ersel Wadleigh C. Donzetta Matters, MD [facility] Office: [phone] Pager: [phone]  08/28/2019 8:45 AM

## 2019-08-28 NOTE — Anesthesia Procedure Notes (Addendum)
Procedure Name: LMA Insertion Date/Time: 08/28/2019 10:17 AM Performed by: Michele Rockers, CRNA Pre-anesthesia Checklist: Patient identified, Emergency Drugs available, Suction available, Timeout performed and Patient being monitored Patient Re-evaluated:Patient Re-evaluated prior to induction Oxygen Delivery Method: Simple face mask Preoxygenation: Pre-oxygenation with 100% oxygen Induction Type: IV induction Ventilation: Mask ventilation without difficulty LMA Size: 5.0 Number of attempts: 1 Airway Equipment and Method: Oral airway Placement Confirmation: positive ETCO2 and breath sounds checked- equal and bilateral Tube secured with: Tape Dental Injury: Teeth and Oropharynx as per pre-operative assessment

## 2019-08-28 NOTE — Progress Notes (Addendum)
Inpatient Diabetes Program Recommendations  AACE/ADA: New Consensus Statement on Inpatient Glycemic Control (2015)  Target Ranges:  Prepandial:   less than 140 mg/dL      Peak postprandial:   less than 180 mg/dL (1-2 hours)      Critically ill patients:  140 - 180 mg/dL   Lab Results  Component Value Date   GLUCAP 153 (H) 08/28/2019   HGBA1C 7.4 (H) 08/22/2019    Review of Glycemic Control Results for JOVANIE, VERGE (MRN [id number]) as of 08/28/2019 10:10  Ref. Range 08/28/2019 06:14 08/28/2019 06:45 08/28/2019 07:04 08/28/2019 07:58  Glucose-Capillary Latest Ref Range: 70 - 99 mg/dL 58 (L) 60 (L) 62 (L) 153 (H)    Diabetes history: Type 2 DM Outpatient Diabetes medications: Lantus 10 units QD, Novolog 3-5 units TID Current orders for Inpatient glycemic control: Novolog 2 units TID, Novolog 0-6 units TID, Lantus 8 units QHS  Inpatient Diabetes Program Recommendations:    Noted mild hypoglycemia this AM. Consider further decreasing Lantus 5 units QHS and would consider adjustment in preparation for discharge.  Thanks, Bronson Curb, MSN, RNC-OB Diabetes Coordinator [phone] (8a-5p)

## 2019-08-28 NOTE — Interval H&P Note (Signed)
History and Physical Interval Note:  08/28/2019 9:03 AM  Jorge [name]  has presented today for surgery, with the diagnosis of END STAGE RENAL DISEASE.  The various methods of treatment have been discussed with the patient and family. After consideration of risks, benefits and other options for treatment, the patient has consented to  Procedure(s): UPPER ARM FISTULA VERSUS GRAFT (N/A) as a surgical intervention.  The patient's history has been reviewed, patient examined, no change in status, stable for surgery.  I have reviewed the patient's chart and labs.  Questions were answered to the patient's satisfaction.     Curt Jews

## 2019-08-28 NOTE — Progress Notes (Signed)
Patient's seat time is TTS 11:40am. OP HD clinic is prepared to start him tomorrow if he is discharged today. He needs to arrive at 11:00am on his first day to sign consents. If not discharged today, he will dialyze inpt tomorrow and start in the clinic on Tuesday. Navigator has requested that Renal PA/D. Zeyfang send HD orders to clinic in preparation for start tomorrow.  Alphonzo Cruise, [place] [facility] Navigator [phone]

## 2019-08-28 NOTE — Progress Notes (Signed)
Patient ID: Jorge [name], male   DOB: December 17, 1970, 49 y.o.   MRN: [id number] The patient understands that the AV fistula versus graft will be in the right arm.  The vein map reveals best vein is the right basilic vein.  The consent is for right arm.  The right arm is marked and the IV was moved to the left side.

## 2019-08-28 NOTE — Progress Notes (Signed)
[facility] Progress Note  Subjective:  bp better, avg/avg today with vvs. Tolerated HD yesterday, UF'ed 2.5L. no complaints.  Vitals:   08/28/19 0504 08/28/19 0505 08/28/19 0508 08/28/19 0757  BP:  (!) 153/93  (!) 159/87  Pulse:  71  70  Resp: (!) 4 18 18 13   Temp:  98.5 F (36.9 C)  97.8 F (36.6 C)  TempSrc:    Oral  SpO2:    98%  Weight: 77.1 kg     Height:        Exam: Gen: NAD, sitting up in bed Resp: unlabored, bl chest expansion CV: reg rate Abd: soft, nt, pd cath c/d/i Ext: bl bka, no dependent edema Neuro: awake, speech clear and coherent, moves all extremities spontaneously HD access: lij tdc c/d/i  Recent Labs  Lab 08/26/19 0619 08/26/19 0619 08/27/19 0237 08/28/19 0317  K 4.0   < > 4.7 4.4  BUN 66*   < > 71* 39*  CREATININE 13.41*   < > 14.19* 9.08*  CALCIUM 7.0*   < > 7.5* 7.8*  PHOS 6.8*  --  6.6*  --   HGB 8.2*   < > 8.8* 7.8*   < > = values in this interval not displayed.   Inpatient medications: . [MAR Hold] (feeding supplement) PROSource Plus  30 mL Oral TID BM  . [MAR Hold] amLODipine  5 mg Oral Daily  . [MAR Hold] aspirin EC  81 mg Oral Daily  . [MAR Hold] atorvastatin  20 mg Oral Daily  . [MAR Hold] calcitRIOL  0.5 mcg Oral Once per day on Mon Wed Fri  . [MAR Hold] calcium carbonate  1 tablet Oral TID WC  . [MAR Hold] carvedilol  12.5 mg Oral BID WC  . [MAR Hold] Chlorhexidine Gluconate Cloth  6 each Topical Daily  . [MAR Hold] darbepoetin (ARANESP) injection - DIALYSIS  200 mcg Intravenous Q Tue-HD  . [MAR Hold] gentamicin cream  1 application Topical Daily  . [MAR Hold] heparin  5,000 Units Subcutaneous Q8H  . [MAR Hold] insulin aspart  0-6 Units Subcutaneous TID WC  . [MAR Hold] insulin aspart  2 Units Subcutaneous TID WC  . [MAR Hold] insulin glargine  7 Units Subcutaneous QHS  . [MAR Hold] multivitamin  1 tablet Oral Daily  . [MAR Hold] pantoprazole  40 mg Oral Daily  . [MAR Hold] sevelamer carbonate  3,200 mg Oral  TID WC   . [MAR Hold] cefUROXime (ZINACEF)  IV     [MAR Hold] dextrose, [MAR Hold] gabapentin, [MAR Hold] hydrALAZINE  Outpatient Center Orders: PD days per week, Nephrologist Dr. Joelyn Oms  EDW 68.5 (kg) (changed to 74kg?) Ca 2.5 (mEq/L) Mg 0.5 (mEq/L) Dextrose 1.5; 2.5; 4.25 %, # Exchanges 5, fil vol 1800 mL, Dwell Time 1 hrs 30 min, last fill vol 0 mL, Day Exchanges 0, Day Dwell Time 0 hrs 0 min    Assessment/Plan: 1. ESRD:  Has not completed PD last 4 days at home, here is azotemic w/ some symptoms of uremia including fatigue, nausea and hiccups, but otherwise stable w/o confusion or myoclonus. Did PD while in-house with concerns of clogged PD catheter which is to be removed as an outpatient.  Not having adequate clearance with PD.  Transitioning to HD.  Status post tunneled dialysis catheter placement on 8/16 1. S/p hd 8/19, next hd tomorrow 8/21, maintain TTS schedule 2. Avf/avg today with vvs 2. Hypotension: resolved 3.  Cough/SOB:  CXR negative, afebrile, COVID neg. WBC  up 15k which has since resolved. Per primary team 4. Hyperkalemia:  resolved 5.  Hypertension/volume: not volume overloaded. uf as tolerated. Started coreg 12.5mg  BID and norvasc 5mg  daily. HR limiting titration of coreg 6.  Anemia: hgb 7.8. mircera 226mcg on 8/17. S/p 1u prbc post catheter placement 7.  Metabolic bone disease:  Calcium low but likely close to goal corrected. Continue calcitriol and renvela.  8.  Nutrition:  Alb low, recommend protein supplement 9. [name]: [employer], awaiting chair time  Gean Quint, MD [employer]

## 2019-08-28 NOTE — Discharge Summary (Signed)
Physician Discharge Summary  Jorge [name] LKG:[id number] DOB: 05-Feb-1970 DOA: 08/21/2019  PCP: Lauree Chandler, NP  Admit date: 08/21/2019 Discharge date: 08/28/2019  Admitted From: Home  Discharge disposition: Home    Recommendations for Outpatient Follow-Up:   . Follow up with your primary care provider in one week.  . Check CBC, BMP, magnesium in the next visit . Follow-up with vascular surgery in 1 to 2 weeks.   Discharge Diagnosis:   Principal Problem:   DKA (diabetic ketoacidoses) ([name]) Active Problems:   End-stage renal disease needing dialysis ([place])   ESRD on peritoneal dialysis ([place])   Gastroparesis due to DM ([place])   Hyperkalemia   Hypertensive urgency   Hypotension    Discharge Condition: Improved.  Diet recommendation: Low sodium, heart healthy.  Carbohydrate-modified.    Wound care: Local care at the vascular surgical site  Code status: Full.   History of Present Illness:   49 year old male with history of type 2 diabetes on insulin, gastroparesis, ESRD on peritoneal dialysis, history of peripheral artery disease status post bilateral below-knee amputation, anemia of chronic disease and protein calorie malnutrition presented to the emergency room for evaluation of feeling poorly for couple of days to presentation.He was not using his peritoneal dialysis for at least 4 days. He was also having nausea, vomiting and epigastric discomfort and no appetite.In the emergency room, he was found with significant electrolyte abnormalities, anion gap metabolic acidosis and hyperkalemia with potassium 5.9. Creatinine was 24 potassium of 4.7 today .7.  Patient was then admitted to hospital for further evaluation and treatment.   Hospital Course:   Following conditions were addressed during hospitalization as listed below,  Diabetic ketoacidosis:  Resolved with insulin drip and patient was subsequently switched to long-acting and sliding scale insulin.  Patient  was also seen by diabetic coordinator during hospitalization.  Patient was emphasized on compliance to insulin regimen on discharge.    ESRD, was on peritoneal dialysis at home: Failed to adequately to remove volume.  Nephrology was consulted and patient  patient underwent temporary hemodialysis catheter placement on 08/24/2019.  Subsequently, vascular surgery was consulted for long-term vascular access.  Patient underwent right basilic vein fistula placement on 08/28/2019 by vascular surgery.  At this time, outpatient hemodialysis has been set up for the patient.  Hypertensive Urgency:  presentation BP >170.  Continue Coreg and amlodipine on discharge.  Hyperkalemia: Improved with dialysis.    Disposition.  At this time, patient is stable for disposition home.  Patient will need to follow-up with vascular surgery primary care physician as outpatient and continue hemodialysis.  Medical Consultants:    Nephrology  Vascular surgery  PCCM  Procedures:     Hemodialysis  Right internal jugular hemodialysis catheter placement on 08/24/2019  Right upper extremity fistula placement on 08/28/2019  Subjective:   Today, patient seen after fistula placement.  Complains of mild pain at the surgical site.  Denies dyspnea, shortness of breath, fever or chills.  Discharge Exam:   Vitals:   08/28/19 1436 08/28/19 1609  BP: 120/66 118/65  Pulse: 88 74  Resp: 14 10  Temp: 98 F (36.7 C) 98.2 F (36.8 C)  SpO2: 99% 98%   Vitals:   08/28/19 1325 08/28/19 1355 08/28/19 1436 08/28/19 1609  BP: 123/72 123/63 120/66 118/65  Pulse: 72 72 88 74  Resp: 10 11 14 10   Temp:  98.2 F (36.8 C) 98 F (36.7 C) 98.2 F (36.8 C)  TempSrc:   Oral Oral  SpO2: 100% 98% 99% 98%  Weight:      Height:       General: Alert awake, not in obvious distress HENT: pupils equally reacting to light,  No scleral pallor or icterus noted. Oral mucosa is moist.  Right internal jugular hemodialysis catheter in  place. Chest:  Clear breath sounds.  Diminished breath sounds bilaterally. No crackles or wheezes.  CVS: S1 &S2 heard. No murmur.  Regular rate and rhythm. Abdomen: Soft, nontender, nondistended.  Bowel sounds are heard.   Extremities: Bilateral lower extremity below-knee amputation.  Right upper extremity fistula on a dressing. Psych: Alert, awake and oriented, normal mood CNS:  No cranial nerve deficits.  Power equal in all extremities.   Skin: Warm and dry.   The results of significant diagnostics from this hospitalization (including imaging, microbiology, ancillary and laboratory) are listed below for reference.     Diagnostic Studies:   DG Chest Portable 1 View  Result Date: 08/22/2019 CLINICAL DATA:  Cough and shortness of breath for 1 week EXAM: PORTABLE CHEST 1 VIEW COMPARISON:  02/11/2005 FINDINGS: The heart size and mediastinal contours are within normal limits. Both lungs are clear. The visualized skeletal structures are unremarkable. IMPRESSION: No acute abnormality noted. Electronically Signed   By: Inez Catalina M.D.   On: 08/22/2019 07:25     Labs:   Basic Metabolic Panel: Recent Labs  Lab 08/24/19 0114 08/24/19 0114 08/25/19 0249 08/25/19 0249 08/26/19 0619 08/26/19 0619 08/27/19 0237 08/28/19 0317  NA 135  --  135  --  133*  --  132* 133*  K 4.5   < > 4.6   < > 4.0   < > 4.7 4.4  CL 97*  --  99  --  98  --  98 101  CO2 17*  --  15*  --  20*  --  20* 23  GLUCOSE 113*  --  135*  --  74  --  112* 93  BUN 116*  --  120*  --  66*  --  71* 39*  CREATININE 22.22*  --  21.22*  --  13.41*  --  14.19* 9.08*  CALCIUM 7.1*  --  6.7*  --  7.0*  --  7.5* 7.8*  MG  --   --   --   --   --   --  1.8  --   PHOS 11.3*  --  10.7*  --  6.8*  --  6.6*  --    < > = values in this interval not displayed.   GFR Estimated Creatinine Clearance: 10.3 mL/min (A) (by C-G formula based on SCr of 9.08 mg/dL (H)). Liver Function Tests: Recent Labs  Lab 08/24/19 0114 08/25/19 0249  08/26/19 0619 08/27/19 0237  AST  --   --   --  27  ALT  --   --   --  7  ALKPHOS  --   --   --  52  BILITOT  --   --   --  0.3  PROT  --   --   --  5.5*  ALBUMIN 2.2* 2.2* 2.1* 2.2*   Recent Labs  Lab 08/26/19 2221  LIPASE 23   No results for input(s): AMMONIA in the last 168 hours. Coagulation profile No results for input(s): INR, PROTIME in the last 168 hours.  CBC: Recent Labs  Lab 08/24/19 0114 08/25/19 0249 08/26/19 0619 08/27/19 0237 08/28/19 0317  WBC 9.1 8.3 8.5 9.1 7.6  HGB 7.0* 8.2* 8.2* 8.8* 7.8*  HCT 21.7* 25.8* 25.9* 27.8* 25.2*  MCV 86.1 89.3 89.6 90.0 90.0  PLT 123* 128* 124* 126* 120*   Cardiac Enzymes: No results for input(s): CKTOTAL, CKMB, CKMBINDEX, TROPONINI in the last 168 hours. BNP: Invalid input(s): POCBNP CBG: Recent Labs  Lab 08/28/19 0614 08/28/19 0645 08/28/19 0704 08/28/19 0758 08/28/19 1307  GLUCAP 58* 60* 62* 153* 101*   D-Dimer No results for input(s): DDIMER in the last 72 hours. Hgb A1c No results for input(s): HGBA1C in the last 72 hours. Lipid Profile No results for input(s): CHOL, HDL, LDLCALC, TRIG, CHOLHDL, LDLDIRECT in the last 72 hours. Thyroid function studies No results for input(s): TSH, T4TOTAL, T3FREE, THYROIDAB in the last 72 hours.  Invalid input(s): FREET3 Anemia work up No results for input(s): VITAMINB12, FOLATE, FERRITIN, TIBC, IRON, RETICCTPCT in the last 72 hours. Microbiology Recent Results (from the past 240 hour(s))  SARS Coronavirus 2 by RT PCR (hospital order, performed in [facility] hospital lab) Nasopharyngeal Nasopharyngeal Swab     Status: None   Collection Time: 08/22/19  7:40 AM   Specimen: Nasopharyngeal Swab  Result Value Ref Range Status   SARS Coronavirus 2 NEGATIVE NEGATIVE Final    Comment: (NOTE) SARS-CoV-2 target nucleic acids are NOT DETECTED.  The SARS-CoV-2 RNA is generally detectable in upper and lower respiratory specimens during the acute phase of infection. The  lowest concentration of SARS-CoV-2 viral copies this assay can detect is 250 copies / mL. A negative result does not preclude SARS-CoV-2 infection and should not be used as the sole basis for treatment or other patient management decisions.  A negative result may occur with improper specimen collection / handling, submission of specimen other than nasopharyngeal swab, presence of viral mutation(s) within the areas targeted by this assay, and inadequate number of viral copies (<250 copies / mL). A negative result must be combined with clinical observations, patient history, and epidemiological information.  Fact Sheet for Patients:   StrictlyIdeas.no  Fact Sheet for Healthcare Providers: BankingDealers.co.za  This test is not yet approved or  cleared by the [place] FDA and has been authorized for detection and/or diagnosis of SARS-CoV-2 by FDA under an Emergency Use Authorization (EUA).  This EUA will remain in effect (meaning this test can be used) for the duration of the COVID-19 declaration under Section 564(b)(1) of the Act, 21 U.S.C. section 360bbb-3(b)(1), unless the authorization is terminated or revoked sooner.  Performed at [facility], [place] [address], [place], [place] [address]   Surgical pcr screen     Status: None   Collection Time: 08/24/19 12:33 AM   Specimen: Nasal Mucosa; Nasal Swab  Result Value Ref Range Status   MRSA, PCR NEGATIVE NEGATIVE Final   Staphylococcus aureus NEGATIVE NEGATIVE Final    Comment: (NOTE) The Xpert SA Assay (FDA approved for NASAL specimens in patients 17 years of age and older), is one component of a comprehensive surveillance program. It is not intended to diagnose infection nor to guide or monitor treatment. Performed at [facility], [place] [address], [place], [place] [address]      Discharge Instructions:   Discharge Instructions    Diet - low sodium heart  healthy   Complete by: As directed    Diet Carb Modified   Complete by: As directed    Discharge instructions   Complete by: As directed    Continue hemodialysis as scheduled.  Follow-up with your primary care physician  in 1 week.   Increase activity slowly   Complete by: As directed    No wound care   Complete by: As directed      Allergies as of 08/28/2019      Reactions   Lactose Intolerance (gi) Diarrhea, Nausea Only      Medication List    STOP taking these medications   furosemide 80 MG tablet Commonly known as: LASIX     TAKE these medications   amLODipine 5 MG tablet Commonly known as: NORVASC Take 1 tablet (5 mg total) by mouth daily. Start taking on: August 29, 2019   aspirin EC 81 MG tablet Take 81 mg by mouth daily.   atorvastatin 20 MG tablet Commonly known as: LIPITOR TAKE 1 TABLET(20 MG) BY MOUTH DAILY What changed: See the new instructions.   calcitRIOL 0.5 MCG capsule Commonly known as: ROCALTROL Take 1 capsule (0.5 mcg total) by mouth 3 (three) times a week.   carvedilol 12.5 MG tablet Commonly known as: COREG Take 1 tablet (12.5 mg total) by mouth 2 (two) times daily with a meal.   cloNIDine 0.1 MG tablet Commonly known as: CATAPRES Daily at bedtime PRN sbp <140 What changed:   how much to take  how to take this  when to take this   Darbepoetin Alfa 200 MCG/0.4ML Sosy injection Commonly known as: ARANESP Inject 0.4 mLs (200 mcg total) into the skin every Thursday at [place] [facility] Check blood sugar twice daily as directed DX E11.9 What changed:   how much to take  how to take this  when to take this   Dexcom G6 Sensor Misc Check blood sugar twice daily as directed DX E11.9 What changed:   how much to take  how to take this  when to take this   Dexcom G6 Transmitter Misc Check blood sugar twice daily as directed DX E11.9 What changed:   how much to take  how to take this  when to take this     gabapentin 300 MG capsule Commonly known as: NEURONTIN Take 1 capsule (300 mg total) by mouth daily as needed. What changed: when to take this   gentamicin cream 0.1 % Commonly known as: GARAMYCIN Apply 1 application topically daily.   Lantus SoloStar 100 UNIT/ML Solostar Pen Generic drug: insulin glargine INJECT 10 UNITS INTO SKIN DAILY What changed: See the new instructions.   metoCLOPramide 10 MG tablet Commonly known as: REGLAN Take 1 tablet (10 mg total) by mouth every 6 (six) hours as needed for nausea or vomiting.   NovoLOG FlexPen 100 UNIT/ML FlexPen Generic drug: insulin aspart INJECT 5 UNITS INTO SKIN 3 TIMES DAILY WITH MEALS What changed: See the new instructions.   ondansetron 4 MG disintegrating tablet Commonly known as: Zofran ODT Take 1 tablet (4 mg total) by mouth every 8 (eight) hours as needed for vomiting (When you are actively vomiting and unable to tolerate Reglan).   oxyCODONE-acetaminophen 5-325 MG tablet Commonly known as: PERCOCET/ROXICET Take 1 tablet by mouth every 6 (six) hours as needed for moderate pain or severe pain.   Pen Needles 30G X 8 MM Misc 1 each by Does not apply route in the morning, at noon, in the evening, and at bedtime.   sevelamer carbonate 800 MG tablet Commonly known as: Renvela Take 4 tablets (3,200 mg total) by mouth 3 (three) times daily with meals.   Vitamin D (Ergocalciferol) 1.25 MG (50000 UNIT) Caps capsule Commonly known  as: DRISDOL Take 50,000 Units by mouth once a week. saturdays       Follow-up Information    [facility] Follow up in 6 week(s).   Specialty: Vascular Surgery Why: sent Contact information: [place] [phone]               Time coordinating discharge: 39 minutes  Signed:  Stephaniemarie Stoffel  [facility] 08/28/2019, 4:11 PM

## 2019-08-28 NOTE — Op Note (Signed)
    OPERATIVE REPORT  DATE OF SURGERY: 08/28/2019  PATIENT: Jorge [name], 49 y.o. male MRN: [id number]  DOB: 09-09-1970  PRE-OPERATIVE DIAGNOSIS: End-stage renal disease  POST-OPERATIVE DIAGNOSIS:  Same  PROCEDURE: Right basilic vein transposition fistula  SURGEON:  Curt Jews, M.D.  PHYSICIAN ASSISTANT: Arlee Muslim, PA-C  The assistant was needed for exposure and to expedite the case  ANESTHESIA: LMA  EBL: per anesthesia record  Total I/O In: 500 [I.V.:500] Out: 350 [Urine:300; Blood:50]  BLOOD ADMINISTERED: none  DRAINS: none  SPECIMEN: none  COUNTS CORRECT:  YES  PATIENT DISPOSITION:  PACU - hemodynamically stable  PROCEDURE DETAILS: The patient was taken up and placed supine position with area of the right arm right axilla were prepped draped you sterile fashion.  Incision was made over the basilic vein in the medial aspect of the elbow.  The vein was very large by [employer].  The decision was made to proceed with a single-stage right basilic vein transposition fistula.  Basilic vein was mobilized from the below the elbow to the axilla and multiple tributary branches were ligated with 3-0 and 4 silk ties and divided.  The vein was of good caliber throughout its course.  The vein was ligated distally and divided and was gently dilated.  The vein was marked to reduce risk of twisting.  Next a separate incision was made over the brachial artery at the antecubital space.  The artery was of large caliber.  It had extensive calcification.  A tunnel was created from the antecubital space to the axilla and the vein was brought through the tunnel.  The brachial artery was occluded proximally distally and was opened with an 11 blade sent ulcerating with Potts scissors.  The vein was cut to the appropriate length and was spatulated and sewn end-to-side to the artery with a running 6-0 Prolene suture.  Clamps removed and excellent thrill was noted.  Wounds irrigated with saline.   Hemostasis after cautery.  Wounds were closed with 3-0 Vicryl in the subcutaneous and subcuticular tissue.  Sterile dressing was applied and the patient was transferred to the recovery in stable condition   Rosetta Posner, M.D., [facility] 08/28/2019 1:34 PM

## 2019-08-28 NOTE — Transfer of Care (Signed)
Immediate Anesthesia Transfer of Care Note  Patient: Jorge [name] Eth  Procedure(s) Performed: [place] (Right Arm Upper)  Patient Location: PACU  Anesthesia Type:General  Level of Consciousness: drowsy and patient cooperative  Airway & Oxygen Therapy: Patient Spontanous Breathing and Patient connected to face mask oxygen  Post-op Assessment: Report given to RN and Post -op Vital signs reviewed and stable  Post vital signs: Reviewed and stable  Last Vitals:  Vitals Value Taken Time  BP 140/77 08/28/19 1307  Temp    Pulse 69 08/28/19 1311  Resp 19 08/28/19 1311  SpO2 100 % 08/28/19 1311  Vitals shown include unvalidated device data.  Last Pain:  Vitals:   08/28/19 0757  TempSrc: Oral  PainSc:       Patients Stated Pain Goal: 0 (61/68/37 2902)  Complications: No complications documented.

## 2019-08-28 NOTE — Anesthesia Preprocedure Evaluation (Addendum)
Anesthesia Evaluation  Patient identified by MRN, date of birth, ID band Patient awake    Reviewed: Allergy & Precautions, NPO status , Patient's Chart, lab work & pertinent test results  Airway Mallampati: III  TM Distance: >3 FB Neck ROM: Full    Dental  (+) Teeth Intact, Dental Advisory Given   Pulmonary former smoker,    breath sounds clear to auscultation       Cardiovascular hypertension,  Rhythm:Regular Rate:Normal     Neuro/Psych negative neurological ROS  negative psych ROS   GI/Hepatic negative GI ROS, Neg liver ROS,   Endo/Other  diabetes, Type 2, Insulin Dependent  Renal/GU Dialysis and ESRFRenal disease     Musculoskeletal negative musculoskeletal ROS (+)   Abdominal Normal abdominal exam  (+)   Peds  Hematology negative hematology ROS (+) anemia ,   Anesthesia Other Findings   Reproductive/Obstetrics                            Anesthesia Physical Anesthesia Plan  ASA: III  Anesthesia Plan: MAC   Post-op Pain Management:    Induction: Intravenous  PONV Risk Score and Plan: 2 and Propofol infusion and Ondansetron  Airway Management Planned: Natural Airway and Simple Face Mask  Additional Equipment: None  Intra-op Plan:   Post-operative Plan:   Informed Consent: I have reviewed the patients History and Physical, chart, labs and discussed the procedure including the risks, benefits and alternatives for the proposed anesthesia with the patient or authorized representative who has indicated his/her understanding and acceptance.       Plan Discussed with: CRNA  Anesthesia Plan Comments: (Pt also consented for GA.  Lab Results      Component                Value               Date                      WBC                      7.6                 08/28/2019                HGB                      7.8 (L)             08/28/2019                HCT                       25.2 (L)            08/28/2019                MCV                      90.0                08/28/2019                PLT                      120 (L)  08/28/2019           )      Anesthesia Quick Evaluation

## 2019-08-28 NOTE — H&P (View-Only) (Signed)
  Progress Note    08/28/2019 8:45 AM 4 Days Post-Op  Subjective: No overnight issues  Vitals:   08/28/19 0508 08/28/19 0757  BP:  (!) 159/87  Pulse:  70  Resp: 18 13  Temp:  97.8 F (36.6 C)  SpO2:  98%    Physical Exam: Awake alert oriented Bilateral radial artery pulses are palpable  CBC    Component Value Date/Time   WBC 7.6 08/28/2019 0317   RBC 2.80 (L) 08/28/2019 0317   HGB 7.8 (L) 08/28/2019 0317   HCT 25.2 (L) 08/28/2019 0317   PLT 120 (L) 08/28/2019 0317   MCV 90.0 08/28/2019 0317   MCH 27.9 08/28/2019 0317   MCHC 31.0 08/28/2019 0317   RDW 17.9 (H) 08/28/2019 0317   LYMPHSABS 1.7 04/20/2019 0537   MONOABS 1.0 04/20/2019 0537   EOSABS 0.5 04/20/2019 0537   BASOSABS 0.1 04/20/2019 0537    BMET    Component Value Date/Time   NA 133 (L) 08/28/2019 0317   NA 136 (A) 07/10/2017 0000   K 4.4 08/28/2019 0317   CL 101 08/28/2019 0317   CO2 23 08/28/2019 0317   GLUCOSE 93 08/28/2019 0317   BUN 39 (H) 08/28/2019 0317   BUN 51 (A) 07/10/2017 0000   CREATININE 9.08 (H) 08/28/2019 0317   CALCIUM 7.8 (L) 08/28/2019 0317   GFRNONAA 6 (L) 08/28/2019 0317   GFRAA 7 (L) 08/28/2019 0317    INR No results found for: INR   Intake/Output Summary (Last 24 hours) at 08/28/2019 0845 Last data filed at 08/28/2019 0819 Gross per 24 hour  Intake 120 ml  Output 2600 ml  Net -2480 ml     Assessment/plan:  49 y.o. male is here with now end-stage renal disease status post catheter placement.  He is now amenable to permanent access.  Appears to have suitable right basilic vein he is left-hand dominant.  I discussed placing IV in his left upper extremity we can remove the right hand IV and we will plan for left arm AV fistula versus graft today in the OR.   Niyla Marone C. Donzetta Matters, MD [facility] [place] Office: [phone] Pager: [phone]  08/28/2019 8:45 AM

## 2019-08-29 DIAGNOSIS — D509 Iron deficiency anemia, unspecified: Secondary | ICD-10-CM | POA: Diagnosis not present

## 2019-08-29 DIAGNOSIS — D688 Other specified coagulation defects: Secondary | ICD-10-CM | POA: Diagnosis not present

## 2019-08-29 DIAGNOSIS — Z992 Dependence on renal dialysis: Secondary | ICD-10-CM | POA: Diagnosis not present

## 2019-08-29 DIAGNOSIS — N2581 Secondary hyperparathyroidism of renal origin: Secondary | ICD-10-CM | POA: Diagnosis not present

## 2019-08-29 DIAGNOSIS — N186 End stage renal disease: Secondary | ICD-10-CM | POA: Diagnosis not present

## 2019-08-29 LAB — GLUCOSE, CAPILLARY: Glucose-Capillary: 191 mg/dL — ABNORMAL HIGH (ref 70–99)

## 2019-08-29 NOTE — Progress Notes (Signed)
He has remained stable over the last 24 hours.  This morning vital signs temperature 98.6, blood pressure 124/76, heart rate 72, respirate 18, oxygen saturation 94%.  His lungs are clear, heart S1-S2, present, soft abdomen.  Patient is being discharged to his outpatient dialysis unit.  Follow-up as an outpatient.

## 2019-08-29 NOTE — Progress Notes (Signed)
Discharge instructions provided to patient. All medications, follow up appointments, and discharge instructions provided. IV out. Monitor off CCMD notified.  Era Bumpers, RN

## 2019-08-29 NOTE — Progress Notes (Signed)
[facility] Progress Note  Subjective:  Has some arm pain, being discharged today but wants to get dialysis today before he leaves. Has an established chair time. No other complaints  Vitals:   08/28/19 2347 08/29/19 0335 08/29/19 0459 08/29/19 0747  BP: (!) 125/58 127/70  124/76  Pulse:      Resp: 16 17  18   Temp: 98 F (36.7 C) 98.2 F (36.8 C)  98.6 F (37 C)  TempSrc: Oral Oral  Oral  SpO2: 100% 97%  94%  Weight:   78.1 kg   Height:        Exam: Gen: NAD, sitting up in bed Resp: unlabored, bl chest expansion CV: reg rate Abd: soft, nt, pd cath c/d/i Ext: bl bka, no dependent edema Neuro: awake, speech clear and coherent, moves all extremities spontaneously HD access: lij tdc c/d/i  Recent Labs  Lab 08/26/19 0619 08/26/19 0619 08/27/19 0237 08/28/19 0317  K 4.0   < > 4.7 4.4  BUN 66*   < > 71* 39*  CREATININE 13.41*   < > 14.19* 9.08*  CALCIUM 7.0*   < > 7.5* 7.8*  PHOS 6.8*  --  6.6*  --   HGB 8.2*   < > 8.8* 7.8*   < > = values in this interval not displayed.   Inpatient medications: . (feeding supplement) PROSource Plus  30 mL Oral TID BM  . amLODipine  5 mg Oral Daily  . aspirin EC  81 mg Oral Daily  . atorvastatin  20 mg Oral Daily  . calcitRIOL  0.5 mcg Oral Once per day on Mon Wed Fri  . calcium carbonate  1 tablet Oral TID WC  . carvedilol  12.5 mg Oral BID WC  . Chlorhexidine Gluconate Cloth  6 each Topical Daily  . darbepoetin (ARANESP) injection - DIALYSIS  200 mcg Intravenous Q Tue-HD  . gentamicin cream  1 application Topical Daily  . heparin  5,000 Units Subcutaneous Q8H  . insulin aspart  0-6 Units Subcutaneous TID WC  . insulin aspart  2 Units Subcutaneous TID WC  . insulin glargine  7 Units Subcutaneous QHS  . multivitamin  1 tablet Oral Daily  . pantoprazole  40 mg Oral Daily  . sevelamer carbonate  3,200 mg Oral TID WC    dextrose, gabapentin, hydrALAZINE, HYDROcodone-acetaminophen,  oxyCODONE-acetaminophen  Outpatient Center Orders: PD days per week, Nephrologist Dr. Joelyn Oms  EDW 68.5 (kg) (changed to 74kg?) Ca 2.5 (mEq/L) Mg 0.5 (mEq/L) Dextrose 1.5; 2.5; 4.25 %, # Exchanges 5, fil vol 1800 mL, Dwell Time 1 hrs 30 min, last fill vol 0 mL, Day Exchanges 0, Day Dwell Time 0 hrs 0 min    Assessment/Plan: 1. ESRD:  Has not completed PD last 4 days at home, here is azotemic w/ some symptoms of uremia including fatigue, nausea and hiccups, but otherwise stable w/o confusion or myoclonus. Did PD while in-house with concerns of clogged PD catheter which is to be removed as an outpatient.  Not having adequate clearance with PD.  Transitioning to HD.  Status post tunneled dialysis catheter placement on 8/16 1. TTS, has a chair at [facility] tts at 11am. 2. Dialysis here in-house? Will defer this to his discharge time/plan vs patient otherwise okay from nephrology perspective to go directly to his dialysis unit 3. Avf/avg 8/20, follow up with [facility] 2. Hypotension: resolved 3.  Cough/SOB:  CXR negative, afebrile, COVID neg. WBC up 15k which has since resolved. Per primary team  4. Hyperkalemia:  resolved 5.  Hypertension/volume: not volume overloaded. uf as tolerated. Started coreg 12.5mg  BID and norvasc 5mg  daily. HR limiting titration of coreg, bp better 6.  Anemia: hgb 7.8. mircera 250mcg on 8/17. S/p 1u prbc post catheter placement 7.  Metabolic bone disease:  Calcium low but likely close to goal corrected. Continue calcitriol and renvela.  8.  Nutrition:  Alb low, recommend protein supplement 9. [name]: [facility] tts 11am  Gean Quint, MD [employer]

## 2019-08-29 NOTE — Progress Notes (Signed)
   VASCULAR SURGERY ASSESSMENT & PLAN:   POD 1 RIGHT BASILIC VEIN TRANSPOSITION: The patient has an excellent thrill in his fistula.  He has a good radial and ulnar signal with the Doppler in the right wrist.  His incisions look fine.  Vascular surgery will be available as needed.  Follow-up has already been arranged.  SUBJECTIVE:   No specific complaints  PHYSICAL EXAM:   Vitals:   08/28/19 2347 08/29/19 0335 08/29/19 0459 08/29/19 0747  BP: (!) 125/58 127/70  124/76  Pulse:      Resp: 16 17  18   Temp: 98 F (36.7 C) 98.2 F (36.8 C)  98.6 F (37 C)  TempSrc: Oral Oral  Oral  SpO2: 100% 97%  94%  Weight:   78.1 kg   Height:       His right upper arm fistula has a good thrill. I cannot palpate a radial pulse however he has a good radial and ulnar signal with the Doppler. His incisions look fine.  LABS:   Lab Results  Component Value Date   WBC 7.6 08/28/2019   HGB 7.8 (L) 08/28/2019   HCT 25.2 (L) 08/28/2019   MCV 90.0 08/28/2019   PLT 120 (L) 08/28/2019   Lab Results  Component Value Date   CREATININE 9.08 (H) 08/28/2019   No results found for: INR, PROTIME CBG (last 3)  Recent Labs    08/28/19 1609 08/28/19 2130 08/29/19 0558  GLUCAP 104* 103* 191*    PROBLEM LIST:    Principal Problem:   DKA (diabetic ketoacidoses) ([place]) Active Problems:   End-stage renal disease needing dialysis ([place])   ESRD on peritoneal dialysis ([place])   Gastroparesis due to DM ([place])   Hyperkalemia   Hypertensive urgency   Hypotension   CURRENT MEDS:   . (feeding supplement) PROSource Plus  30 mL Oral TID BM  . amLODipine  5 mg Oral Daily  . aspirin EC  81 mg Oral Daily  . atorvastatin  20 mg Oral Daily  . calcitRIOL  0.5 mcg Oral Once per day on Mon Wed Fri  . calcium carbonate  1 tablet Oral TID WC  . carvedilol  12.5 mg Oral BID WC  . Chlorhexidine Gluconate Cloth  6 each Topical Daily  . darbepoetin (ARANESP) injection - DIALYSIS  200 mcg Intravenous Q Tue-HD  .  gentamicin cream  1 application Topical Daily  . heparin  5,000 Units Subcutaneous Q8H  . insulin aspart  0-6 Units Subcutaneous TID WC  . insulin aspart  2 Units Subcutaneous TID WC  . insulin glargine  7 Units Subcutaneous QHS  . multivitamin  1 tablet Oral Daily  . pantoprazole  40 mg Oral Daily  . sevelamer carbonate  3,200 mg Oral TID WC    Deitra Mayo Office: [phone] 08/29/2019

## 2019-08-30 ENCOUNTER — Telehealth (HOSPITAL_COMMUNITY): Payer: Self-pay | Admitting: Nephrology

## 2019-08-30 NOTE — Telephone Encounter (Signed)
Transition of care contact from inpatient facility  Date of discharge: 08/29/2019 Date of contact:  08/30/2019 Method: Phone Spoke to: Patient  Patient contacted to discuss transition of care from recent inpatient hospitalization. Patient was admitted to [facility] from 8/13 - 08/29/2019 with discharge diagnosis of DKA, uncontrolled DM, peritoneal HD failure -> converstion to HD.  Medication changes were reviewed. He does not have changes.  Patient will follow up with his/her outpatient HD unit on: TTS schedule. Went there yesterday -- he denies any issues.  R arm still tender following AVF surgery earlier this week - should continue to improve.  Veneta Penton, PA-C [employer] Pager [phone]

## 2019-08-31 ENCOUNTER — Ambulatory Visit: Payer: Medicare Other

## 2019-08-31 ENCOUNTER — Encounter (HOSPITAL_COMMUNITY): Payer: Self-pay | Admitting: Vascular Surgery

## 2019-08-31 NOTE — Anesthesia Postprocedure Evaluation (Signed)
Anesthesia Post Note  Patient: Jorge [name]  Procedure(s) Performed: [place] (Right Arm Upper)     Patient location during evaluation: PACU Anesthesia Type: General Level of consciousness: awake and alert Pain management: pain level controlled Vital Signs Assessment: post-procedure vital signs reviewed and stable Respiratory status: spontaneous breathing, nonlabored ventilation, respiratory function stable and patient connected to nasal cannula oxygen Cardiovascular status: blood pressure returned to baseline and stable Postop Assessment: no apparent nausea or vomiting Anesthetic complications: no   No complications documented.  Last Vitals:  Vitals:   08/29/19 0335 08/29/19 0747  BP: 127/70 124/76  Pulse:    Resp: 17 18  Temp: 36.8 C 37 C  SpO2: 97% 94%    Last Pain:  Vitals:   08/29/19 0747  TempSrc: Oral  PainSc:                  Effie Berkshire

## 2019-09-01 ENCOUNTER — Telehealth: Payer: Self-pay | Admitting: *Deleted

## 2019-09-01 DIAGNOSIS — D688 Other specified coagulation defects: Secondary | ICD-10-CM | POA: Diagnosis not present

## 2019-09-01 DIAGNOSIS — D509 Iron deficiency anemia, unspecified: Secondary | ICD-10-CM | POA: Diagnosis not present

## 2019-09-01 DIAGNOSIS — Z7689 Persons encountering health services in other specified circumstances: Secondary | ICD-10-CM | POA: Diagnosis not present

## 2019-09-01 DIAGNOSIS — Z992 Dependence on renal dialysis: Secondary | ICD-10-CM | POA: Diagnosis not present

## 2019-09-01 DIAGNOSIS — N2581 Secondary hyperparathyroidism of renal origin: Secondary | ICD-10-CM | POA: Diagnosis not present

## 2019-09-01 DIAGNOSIS — N186 End stage renal disease: Secondary | ICD-10-CM | POA: Diagnosis not present

## 2019-09-01 NOTE — Telephone Encounter (Signed)
Transition Care Management Follow-Up Telephone Call   Date discharged and where: 07/28/2019 [place]  How have you been since you were released from the hospital?  Fine  Any patient concerns? No  Items Reviewed:   Meds: Yes  Allergies:Yes  Dietary Changes Reviewed:Yes Functional Questionnaire:  Independent-I Dependent-D  ADLs:I   Dressing- I    Eating-I   Maintaining continence-I   Transferring-I   Transportation-I   Meal Prep-I   Managing Meds- I  Confirmed importance and Date/Time of follow-up visits scheduled:Appointment 09/04/2019 with Janett Billow   Confirmed with patient if condition worsens to call PCP or go to the Emergency Dept. Patient was given office number and encouraged to call back with questions or concerns: Yes

## 2019-09-02 ENCOUNTER — Ambulatory Visit: Payer: Medicaid Other

## 2019-09-04 ENCOUNTER — Encounter: Payer: Self-pay | Admitting: Nurse Practitioner

## 2019-09-05 DIAGNOSIS — Z7689 Persons encountering health services in other specified circumstances: Secondary | ICD-10-CM | POA: Diagnosis not present

## 2019-09-05 DIAGNOSIS — N2581 Secondary hyperparathyroidism of renal origin: Secondary | ICD-10-CM | POA: Diagnosis not present

## 2019-09-05 DIAGNOSIS — Z992 Dependence on renal dialysis: Secondary | ICD-10-CM | POA: Diagnosis not present

## 2019-09-05 DIAGNOSIS — N186 End stage renal disease: Secondary | ICD-10-CM | POA: Diagnosis not present

## 2019-09-05 DIAGNOSIS — D509 Iron deficiency anemia, unspecified: Secondary | ICD-10-CM | POA: Diagnosis not present

## 2019-09-05 DIAGNOSIS — D688 Other specified coagulation defects: Secondary | ICD-10-CM | POA: Diagnosis not present

## 2019-09-07 ENCOUNTER — Encounter: Payer: Medicaid Other | Admitting: Physical Therapy

## 2019-09-08 DIAGNOSIS — Z992 Dependence on renal dialysis: Secondary | ICD-10-CM | POA: Diagnosis not present

## 2019-09-08 DIAGNOSIS — E1122 Type 2 diabetes mellitus with diabetic chronic kidney disease: Secondary | ICD-10-CM | POA: Diagnosis not present

## 2019-09-08 DIAGNOSIS — D688 Other specified coagulation defects: Secondary | ICD-10-CM | POA: Diagnosis not present

## 2019-09-08 DIAGNOSIS — N186 End stage renal disease: Secondary | ICD-10-CM | POA: Diagnosis not present

## 2019-09-08 DIAGNOSIS — N2581 Secondary hyperparathyroidism of renal origin: Secondary | ICD-10-CM | POA: Diagnosis not present

## 2019-09-08 DIAGNOSIS — D509 Iron deficiency anemia, unspecified: Secondary | ICD-10-CM | POA: Diagnosis not present

## 2019-09-09 ENCOUNTER — Encounter: Payer: Medicaid Other | Admitting: Rehabilitation

## 2019-09-10 DIAGNOSIS — N2581 Secondary hyperparathyroidism of renal origin: Secondary | ICD-10-CM | POA: Diagnosis not present

## 2019-09-10 DIAGNOSIS — N186 End stage renal disease: Secondary | ICD-10-CM | POA: Diagnosis not present

## 2019-09-10 DIAGNOSIS — D509 Iron deficiency anemia, unspecified: Secondary | ICD-10-CM | POA: Diagnosis not present

## 2019-09-10 DIAGNOSIS — Z992 Dependence on renal dialysis: Secondary | ICD-10-CM | POA: Diagnosis not present

## 2019-09-10 DIAGNOSIS — R770 Abnormality of albumin: Secondary | ICD-10-CM | POA: Insufficient documentation

## 2019-09-10 DIAGNOSIS — D688 Other specified coagulation defects: Secondary | ICD-10-CM | POA: Diagnosis not present

## 2019-09-12 DIAGNOSIS — D509 Iron deficiency anemia, unspecified: Secondary | ICD-10-CM | POA: Diagnosis not present

## 2019-09-12 DIAGNOSIS — N2581 Secondary hyperparathyroidism of renal origin: Secondary | ICD-10-CM | POA: Diagnosis not present

## 2019-09-12 DIAGNOSIS — N186 End stage renal disease: Secondary | ICD-10-CM | POA: Diagnosis not present

## 2019-09-12 DIAGNOSIS — D688 Other specified coagulation defects: Secondary | ICD-10-CM | POA: Diagnosis not present

## 2019-09-12 DIAGNOSIS — Z992 Dependence on renal dialysis: Secondary | ICD-10-CM | POA: Diagnosis not present

## 2019-09-14 DIAGNOSIS — I120 Hypertensive chronic kidney disease with stage 5 chronic kidney disease or end stage renal disease: Secondary | ICD-10-CM | POA: Diagnosis not present

## 2019-09-14 DIAGNOSIS — Z89511 Acquired absence of right leg below knee: Secondary | ICD-10-CM | POA: Diagnosis not present

## 2019-09-14 DIAGNOSIS — Z89512 Acquired absence of left leg below knee: Secondary | ICD-10-CM | POA: Diagnosis not present

## 2019-09-15 ENCOUNTER — Telehealth: Payer: Self-pay | Admitting: Orthopedic Surgery

## 2019-09-15 ENCOUNTER — Other Ambulatory Visit: Payer: Self-pay

## 2019-09-15 DIAGNOSIS — D509 Iron deficiency anemia, unspecified: Secondary | ICD-10-CM | POA: Diagnosis not present

## 2019-09-15 DIAGNOSIS — Z992 Dependence on renal dialysis: Secondary | ICD-10-CM | POA: Diagnosis not present

## 2019-09-15 DIAGNOSIS — N2581 Secondary hyperparathyroidism of renal origin: Secondary | ICD-10-CM | POA: Diagnosis not present

## 2019-09-15 DIAGNOSIS — D688 Other specified coagulation defects: Secondary | ICD-10-CM | POA: Diagnosis not present

## 2019-09-15 DIAGNOSIS — Z89511 Acquired absence of right leg below knee: Secondary | ICD-10-CM

## 2019-09-15 DIAGNOSIS — Z7689 Persons encountering health services in other specified circumstances: Secondary | ICD-10-CM | POA: Diagnosis not present

## 2019-09-15 DIAGNOSIS — N186 End stage renal disease: Secondary | ICD-10-CM | POA: Diagnosis not present

## 2019-09-15 NOTE — Telephone Encounter (Signed)
Bilateral BKA order entered for physical therapy eval and treat

## 2019-09-15 NOTE — Telephone Encounter (Signed)
Patient called.   He is requesting a new physical therapy referral.   Call back: [phone]

## 2019-09-16 ENCOUNTER — Encounter: Payer: Medicaid Other | Admitting: Rehabilitation

## 2019-09-18 ENCOUNTER — Encounter: Payer: Medicaid Other | Admitting: Physical Therapy

## 2019-09-19 DIAGNOSIS — Z992 Dependence on renal dialysis: Secondary | ICD-10-CM | POA: Diagnosis not present

## 2019-09-19 DIAGNOSIS — R0602 Shortness of breath: Secondary | ICD-10-CM | POA: Diagnosis not present

## 2019-09-19 DIAGNOSIS — N186 End stage renal disease: Secondary | ICD-10-CM | POA: Diagnosis not present

## 2019-09-19 DIAGNOSIS — N2581 Secondary hyperparathyroidism of renal origin: Secondary | ICD-10-CM | POA: Diagnosis not present

## 2019-09-19 DIAGNOSIS — Z7689 Persons encountering health services in other specified circumstances: Secondary | ICD-10-CM | POA: Diagnosis not present

## 2019-09-19 DIAGNOSIS — D688 Other specified coagulation defects: Secondary | ICD-10-CM | POA: Diagnosis not present

## 2019-09-19 DIAGNOSIS — D509 Iron deficiency anemia, unspecified: Secondary | ICD-10-CM | POA: Diagnosis not present

## 2019-09-21 ENCOUNTER — Encounter: Payer: Medicaid Other | Admitting: Physical Therapy

## 2019-09-21 ENCOUNTER — Other Ambulatory Visit: Payer: Self-pay

## 2019-09-21 DIAGNOSIS — N186 End stage renal disease: Secondary | ICD-10-CM | POA: Diagnosis not present

## 2019-09-21 DIAGNOSIS — T8249XA Other complication of vascular dialysis catheter, initial encounter: Secondary | ICD-10-CM | POA: Diagnosis not present

## 2019-09-21 DIAGNOSIS — Z992 Dependence on renal dialysis: Secondary | ICD-10-CM | POA: Diagnosis not present

## 2019-09-22 DIAGNOSIS — N2581 Secondary hyperparathyroidism of renal origin: Secondary | ICD-10-CM | POA: Diagnosis not present

## 2019-09-22 DIAGNOSIS — D509 Iron deficiency anemia, unspecified: Secondary | ICD-10-CM | POA: Diagnosis not present

## 2019-09-22 DIAGNOSIS — Z992 Dependence on renal dialysis: Secondary | ICD-10-CM | POA: Diagnosis not present

## 2019-09-22 DIAGNOSIS — N186 End stage renal disease: Secondary | ICD-10-CM | POA: Diagnosis not present

## 2019-09-22 DIAGNOSIS — D688 Other specified coagulation defects: Secondary | ICD-10-CM | POA: Diagnosis not present

## 2019-09-23 ENCOUNTER — Encounter: Payer: Self-pay | Admitting: Nurse Practitioner

## 2019-09-23 ENCOUNTER — Ambulatory Visit: Payer: Medicaid Other | Admitting: Skilled Nursing Facility1

## 2019-09-24 DIAGNOSIS — N186 End stage renal disease: Secondary | ICD-10-CM | POA: Diagnosis not present

## 2019-09-24 DIAGNOSIS — N2581 Secondary hyperparathyroidism of renal origin: Secondary | ICD-10-CM | POA: Diagnosis not present

## 2019-09-24 DIAGNOSIS — D509 Iron deficiency anemia, unspecified: Secondary | ICD-10-CM | POA: Diagnosis not present

## 2019-09-24 DIAGNOSIS — Z992 Dependence on renal dialysis: Secondary | ICD-10-CM | POA: Diagnosis not present

## 2019-09-24 DIAGNOSIS — D688 Other specified coagulation defects: Secondary | ICD-10-CM | POA: Diagnosis not present

## 2019-09-25 ENCOUNTER — Encounter: Payer: Medicaid Other | Admitting: Physical Therapy

## 2019-09-26 DIAGNOSIS — D688 Other specified coagulation defects: Secondary | ICD-10-CM | POA: Diagnosis not present

## 2019-09-26 DIAGNOSIS — N2581 Secondary hyperparathyroidism of renal origin: Secondary | ICD-10-CM | POA: Diagnosis not present

## 2019-09-26 DIAGNOSIS — Z992 Dependence on renal dialysis: Secondary | ICD-10-CM | POA: Diagnosis not present

## 2019-09-26 DIAGNOSIS — N186 End stage renal disease: Secondary | ICD-10-CM | POA: Diagnosis not present

## 2019-09-26 DIAGNOSIS — D509 Iron deficiency anemia, unspecified: Secondary | ICD-10-CM | POA: Diagnosis not present

## 2019-09-28 ENCOUNTER — Other Ambulatory Visit: Payer: Self-pay

## 2019-09-28 ENCOUNTER — Ambulatory Visit: Payer: Medicare Other | Attending: Physician Assistant | Admitting: Rehabilitation

## 2019-09-28 ENCOUNTER — Ambulatory Visit: Payer: Medicare Other | Admitting: Rehabilitation

## 2019-09-28 ENCOUNTER — Encounter: Payer: Self-pay | Admitting: Rehabilitation

## 2019-09-28 ENCOUNTER — Encounter: Payer: Medicaid Other | Admitting: Rehabilitation

## 2019-09-28 DIAGNOSIS — R2681 Unsteadiness on feet: Secondary | ICD-10-CM | POA: Insufficient documentation

## 2019-09-28 DIAGNOSIS — R296 Repeated falls: Secondary | ICD-10-CM | POA: Diagnosis not present

## 2019-09-28 DIAGNOSIS — R2689 Other abnormalities of gait and mobility: Secondary | ICD-10-CM | POA: Diagnosis not present

## 2019-09-28 DIAGNOSIS — M6281 Muscle weakness (generalized): Secondary | ICD-10-CM | POA: Insufficient documentation
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
## 2019-09-29 DIAGNOSIS — D509 Iron deficiency anemia, unspecified: Secondary | ICD-10-CM | POA: Diagnosis not present

## 2019-09-29 DIAGNOSIS — N2581 Secondary hyperparathyroidism of renal origin: Secondary | ICD-10-CM | POA: Diagnosis not present

## 2019-09-29 DIAGNOSIS — Z992 Dependence on renal dialysis: Secondary | ICD-10-CM | POA: Diagnosis not present

## 2019-09-29 DIAGNOSIS — D688 Other specified coagulation defects: Secondary | ICD-10-CM | POA: Diagnosis not present

## 2019-09-29 DIAGNOSIS — N186 End stage renal disease: Secondary | ICD-10-CM | POA: Diagnosis not present

## 2019-09-30 ENCOUNTER — Encounter: Payer: Medicaid Other | Admitting: Rehabilitation

## 2019-09-30 DIAGNOSIS — T829XXS Unspecified complication of cardiac and vascular prosthetic device, implant and graft, sequela: Secondary | ICD-10-CM | POA: Diagnosis not present

## 2019-09-30 DIAGNOSIS — Z4902 Encounter for fitting and adjustment of peritoneal dialysis catheter: Secondary | ICD-10-CM | POA: Diagnosis not present

## 2019-09-30 DIAGNOSIS — N186 End stage renal disease: Secondary | ICD-10-CM | POA: Diagnosis not present

## 2019-09-30 DIAGNOSIS — Z992 Dependence on renal dialysis: Secondary | ICD-10-CM | POA: Diagnosis not present

## 2019-09-30 DIAGNOSIS — E119 Type 2 diabetes mellitus without complications: Secondary | ICD-10-CM | POA: Diagnosis not present

## 2019-10-01 DIAGNOSIS — N2581 Secondary hyperparathyroidism of renal origin: Secondary | ICD-10-CM | POA: Diagnosis not present

## 2019-10-01 DIAGNOSIS — I120 Hypertensive chronic kidney disease with stage 5 chronic kidney disease or end stage renal disease: Secondary | ICD-10-CM | POA: Diagnosis not present

## 2019-10-01 DIAGNOSIS — N186 End stage renal disease: Secondary | ICD-10-CM | POA: Diagnosis not present

## 2019-10-01 DIAGNOSIS — D688 Other specified coagulation defects: Secondary | ICD-10-CM | POA: Diagnosis not present

## 2019-10-01 DIAGNOSIS — I160 Hypertensive urgency: Secondary | ICD-10-CM | POA: Diagnosis not present

## 2019-10-01 DIAGNOSIS — Z992 Dependence on renal dialysis: Secondary | ICD-10-CM | POA: Diagnosis not present

## 2019-10-01 DIAGNOSIS — D509 Iron deficiency anemia, unspecified: Secondary | ICD-10-CM | POA: Diagnosis not present

## 2019-10-01 DIAGNOSIS — E1110 Type 2 diabetes mellitus with ketoacidosis without coma: Secondary | ICD-10-CM | POA: Diagnosis not present

## 2019-10-01 DIAGNOSIS — R392 Extrarenal uremia: Secondary | ICD-10-CM | POA: Diagnosis not present

## 2019-10-03 ENCOUNTER — Inpatient Hospital Stay (HOSPITAL_COMMUNITY): Payer: Medicare Other

## 2019-10-03 ENCOUNTER — Encounter (HOSPITAL_COMMUNITY): Payer: Self-pay | Admitting: Student in an Organized Health Care Education/Training Program

## 2019-10-03 ENCOUNTER — Inpatient Hospital Stay (HOSPITAL_COMMUNITY)
Admission: EM | Admit: 2019-10-03 | Discharge: 2019-10-16 | DRG: 064 | Disposition: A | Discharge status: Skilled Nursing Facility | Payer: Medicare Other | Attending: Internal Medicine | Admitting: Internal Medicine

## 2019-10-03 ENCOUNTER — Emergency Department (HOSPITAL_COMMUNITY): Payer: Medicare Other

## 2019-10-03 DIAGNOSIS — E739 Lactose intolerance, unspecified: Secondary | ICD-10-CM

## 2019-10-03 DIAGNOSIS — E1143 Type 2 diabetes mellitus with diabetic autonomic (poly)neuropathy: Secondary | ICD-10-CM | POA: Diagnosis not present

## 2019-10-03 DIAGNOSIS — E161 Other hypoglycemia: Secondary | ICD-10-CM | POA: Diagnosis not present

## 2019-10-03 DIAGNOSIS — I161 Hypertensive emergency: Secondary | ICD-10-CM | POA: Diagnosis not present

## 2019-10-03 DIAGNOSIS — E871 Hypo-osmolality and hyponatremia: Secondary | ICD-10-CM | POA: Diagnosis not present

## 2019-10-03 DIAGNOSIS — R0602 Shortness of breath: Secondary | ICD-10-CM | POA: Diagnosis not present

## 2019-10-03 DIAGNOSIS — I619 Nontraumatic intracerebral hemorrhage, unspecified: Secondary | ICD-10-CM

## 2019-10-03 DIAGNOSIS — Z79899 Other long term (current) drug therapy: Secondary | ICD-10-CM

## 2019-10-03 DIAGNOSIS — E1122 Type 2 diabetes mellitus with diabetic chronic kidney disease: Secondary | ICD-10-CM | POA: Diagnosis present

## 2019-10-03 DIAGNOSIS — G8194 Hemiplegia, unspecified affecting left nondominant side: Secondary | ICD-10-CM | POA: Diagnosis present

## 2019-10-03 DIAGNOSIS — I639 Cerebral infarction, unspecified: Secondary | ICD-10-CM | POA: Diagnosis not present

## 2019-10-03 DIAGNOSIS — R29898 Other symptoms and signs involving the musculoskeletal system: Secondary | ICD-10-CM | POA: Diagnosis not present

## 2019-10-03 DIAGNOSIS — R471 Dysarthria and anarthria: Secondary | ICD-10-CM | POA: Diagnosis not present

## 2019-10-03 DIAGNOSIS — I609 Nontraumatic subarachnoid hemorrhage, unspecified: Secondary | ICD-10-CM | POA: Diagnosis not present

## 2019-10-03 DIAGNOSIS — H547 Unspecified visual loss: Secondary | ICD-10-CM | POA: Diagnosis present

## 2019-10-03 DIAGNOSIS — K3184 Gastroparesis: Secondary | ICD-10-CM | POA: Diagnosis not present

## 2019-10-03 DIAGNOSIS — I69120 Aphasia following nontraumatic intracerebral hemorrhage: Secondary | ICD-10-CM | POA: Diagnosis not present

## 2019-10-03 DIAGNOSIS — I6523 Occlusion and stenosis of bilateral carotid arteries: Secondary | ICD-10-CM | POA: Diagnosis not present

## 2019-10-03 DIAGNOSIS — R278 Other lack of coordination: Secondary | ICD-10-CM | POA: Diagnosis not present

## 2019-10-03 DIAGNOSIS — A0472 Enterocolitis due to Clostridium difficile, not specified as recurrent: Secondary | ICD-10-CM

## 2019-10-03 DIAGNOSIS — Z8249 Family history of ischemic heart disease and other diseases of the circulatory system: Secondary | ICD-10-CM

## 2019-10-03 DIAGNOSIS — E162 Hypoglycemia, unspecified: Secondary | ICD-10-CM | POA: Diagnosis not present

## 2019-10-03 DIAGNOSIS — G936 Cerebral edema: Secondary | ICD-10-CM | POA: Diagnosis not present

## 2019-10-03 DIAGNOSIS — Z87891 Personal history of nicotine dependence: Secondary | ICD-10-CM

## 2019-10-03 DIAGNOSIS — Z7982 Long term (current) use of aspirin: Secondary | ICD-10-CM

## 2019-10-03 DIAGNOSIS — E8889 Other specified metabolic disorders: Secondary | ICD-10-CM | POA: Diagnosis not present

## 2019-10-03 DIAGNOSIS — S066X0A Traumatic subarachnoid hemorrhage without loss of consciousness, initial encounter: Secondary | ICD-10-CM | POA: Diagnosis not present

## 2019-10-03 DIAGNOSIS — I618 Other nontraumatic intracerebral hemorrhage: Secondary | ICD-10-CM | POA: Diagnosis not present

## 2019-10-03 DIAGNOSIS — E119 Type 2 diabetes mellitus without complications: Secondary | ICD-10-CM

## 2019-10-03 DIAGNOSIS — Z992 Dependence on renal dialysis: Secondary | ICD-10-CM | POA: Diagnosis not present

## 2019-10-03 DIAGNOSIS — I517 Cardiomegaly: Secondary | ICD-10-CM | POA: Diagnosis not present

## 2019-10-03 DIAGNOSIS — R29810 Facial weakness: Secondary | ICD-10-CM | POA: Diagnosis not present

## 2019-10-03 DIAGNOSIS — Z89512 Acquired absence of left leg below knee: Secondary | ICD-10-CM

## 2019-10-03 DIAGNOSIS — R1310 Dysphagia, unspecified: Secondary | ICD-10-CM | POA: Diagnosis present

## 2019-10-03 DIAGNOSIS — G9340 Encephalopathy, unspecified: Secondary | ICD-10-CM | POA: Diagnosis present

## 2019-10-03 DIAGNOSIS — I610 Nontraumatic intracerebral hemorrhage in hemisphere, subcortical: Secondary | ICD-10-CM | POA: Diagnosis not present

## 2019-10-03 DIAGNOSIS — E785 Hyperlipidemia, unspecified: Secondary | ICD-10-CM | POA: Diagnosis present

## 2019-10-03 DIAGNOSIS — N2581 Secondary hyperparathyroidism of renal origin: Secondary | ICD-10-CM | POA: Diagnosis not present

## 2019-10-03 DIAGNOSIS — R519 Headache, unspecified: Secondary | ICD-10-CM | POA: Diagnosis not present

## 2019-10-03 DIAGNOSIS — N186 End stage renal disease: Secondary | ICD-10-CM | POA: Diagnosis not present

## 2019-10-03 DIAGNOSIS — R2689 Other abnormalities of gait and mobility: Secondary | ICD-10-CM | POA: Diagnosis not present

## 2019-10-03 DIAGNOSIS — G9341 Metabolic encephalopathy: Secondary | ICD-10-CM | POA: Diagnosis present

## 2019-10-03 DIAGNOSIS — M6281 Muscle weakness (generalized): Secondary | ICD-10-CM | POA: Diagnosis not present

## 2019-10-03 DIAGNOSIS — I615 Nontraumatic intracerebral hemorrhage, intraventricular: Secondary | ICD-10-CM | POA: Diagnosis present

## 2019-10-03 DIAGNOSIS — R531 Weakness: Secondary | ICD-10-CM | POA: Diagnosis not present

## 2019-10-03 DIAGNOSIS — R29711 NIHSS score 11: Secondary | ICD-10-CM | POA: Diagnosis present

## 2019-10-03 DIAGNOSIS — R05 Cough: Secondary | ICD-10-CM | POA: Diagnosis not present

## 2019-10-03 DIAGNOSIS — Z23 Encounter for immunization: Secondary | ICD-10-CM

## 2019-10-03 DIAGNOSIS — Z806 Family history of leukemia: Secondary | ICD-10-CM

## 2019-10-03 DIAGNOSIS — D696 Thrombocytopenia, unspecified: Secondary | ICD-10-CM | POA: Diagnosis present

## 2019-10-03 DIAGNOSIS — I616 Nontraumatic intracerebral hemorrhage, multiple localized: Secondary | ICD-10-CM | POA: Diagnosis not present

## 2019-10-03 DIAGNOSIS — I120 Hypertensive chronic kidney disease with stage 5 chronic kidney disease or end stage renal disease: Secondary | ICD-10-CM | POA: Diagnosis not present

## 2019-10-03 DIAGNOSIS — Z794 Long term (current) use of insulin: Secondary | ICD-10-CM | POA: Diagnosis not present

## 2019-10-03 DIAGNOSIS — E8770 Fluid overload, unspecified: Secondary | ICD-10-CM | POA: Diagnosis not present

## 2019-10-03 DIAGNOSIS — I629 Nontraumatic intracranial hemorrhage, unspecified: Secondary | ICD-10-CM | POA: Diagnosis not present

## 2019-10-03 DIAGNOSIS — Z20822 Contact with and (suspected) exposure to covid-19: Secondary | ICD-10-CM | POA: Diagnosis present

## 2019-10-03 DIAGNOSIS — J9811 Atelectasis: Secondary | ICD-10-CM | POA: Diagnosis not present

## 2019-10-03 DIAGNOSIS — I10 Essential (primary) hypertension: Secondary | ICD-10-CM | POA: Diagnosis not present

## 2019-10-03 DIAGNOSIS — D631 Anemia in chronic kidney disease: Secondary | ICD-10-CM | POA: Diagnosis not present

## 2019-10-03 DIAGNOSIS — Z89511 Acquired absence of right leg below knee: Secondary | ICD-10-CM | POA: Diagnosis not present

## 2019-10-03 DIAGNOSIS — R059 Cough, unspecified: Secondary | ICD-10-CM

## 2019-10-03 DIAGNOSIS — E11649 Type 2 diabetes mellitus with hypoglycemia without coma: Secondary | ICD-10-CM | POA: Diagnosis present

## 2019-10-03 DIAGNOSIS — Z823 Family history of stroke: Secondary | ICD-10-CM

## 2019-10-03 DIAGNOSIS — I6389 Other cerebral infarction: Secondary | ICD-10-CM | POA: Diagnosis not present

## 2019-10-03 DIAGNOSIS — Z833 Family history of diabetes mellitus: Secondary | ICD-10-CM

## 2019-10-03 DIAGNOSIS — G4489 Other headache syndrome: Secondary | ICD-10-CM | POA: Diagnosis not present

## 2019-10-03 DIAGNOSIS — Z8673 Personal history of transient ischemic attack (TIA), and cerebral infarction without residual deficits: Secondary | ICD-10-CM

## 2019-10-03 DIAGNOSIS — Z743 Need for continuous supervision: Secondary | ICD-10-CM | POA: Diagnosis not present

## 2019-10-03 LAB — CBC
HCT: 33.6 % — ABNORMAL LOW (ref 39.0–52.0)
Hemoglobin: 9.7 g/dL — ABNORMAL LOW (ref 13.0–17.0)
MCH: 28.8 pg (ref 26.0–34.0)
MCHC: 28.9 g/dL — ABNORMAL LOW (ref 30.0–36.0)
MCV: 99.7 fL (ref 80.0–100.0)
Platelets: 94 10*3/uL — ABNORMAL LOW (ref 150–400)
RBC: 3.37 MIL/uL — ABNORMAL LOW (ref 4.22–5.81)
RDW: 16.8 % — ABNORMAL HIGH (ref 11.5–15.5)
WBC: 7.6 10*3/uL (ref 4.0–10.5)
nRBC: 0 % (ref 0.0–0.2)

## 2019-10-03 LAB — RESPIRATORY PANEL BY RT PCR (FLU A&B, COVID)
Influenza A by PCR: NEGATIVE
Influenza B by PCR: NEGATIVE
SARS Coronavirus 2 by RT PCR: NEGATIVE

## 2019-10-03 LAB — COMPREHENSIVE METABOLIC PANEL
ALT: 14 U/L (ref 0–44)
AST: 24 U/L (ref 15–41)
Albumin: 3.3 g/dL — ABNORMAL LOW (ref 3.5–5.0)
Alkaline Phosphatase: 93 U/L (ref 38–126)
Anion gap: 19 — ABNORMAL HIGH (ref 5–15)
BUN: 38 mg/dL — ABNORMAL HIGH (ref 6–20)
CO2: 23 mmol/L (ref 22–32)
Calcium: 9 mg/dL (ref 8.9–10.3)
Chloride: 93 mmol/L — ABNORMAL LOW (ref 98–111)
Creatinine, Ser: 7.59 mg/dL — ABNORMAL HIGH (ref 0.61–1.24)
GFR calc Af Amer: 9 mL/min — ABNORMAL LOW (ref >60)
GFR calc non Af Amer: 7 mL/min — ABNORMAL LOW (ref >60)
Glucose, Bld: 68 mg/dL — ABNORMAL LOW (ref 70–99)
Potassium: 4.6 mmol/L (ref 3.5–5.1)
Sodium: 135 mmol/L (ref 135–145)
Total Bilirubin: 0.8 mg/dL (ref 0.3–1.2)
Total Protein: 7 g/dL (ref 6.5–8.1)

## 2019-10-03 LAB — I-STAT CHEM 8, ED
BUN: 38 mg/dL — ABNORMAL HIGH (ref 6–20)
Calcium, Ion: 0.97 mmol/L — ABNORMAL LOW (ref 1.15–1.40)
Chloride: 95 mmol/L — ABNORMAL LOW (ref 98–111)
Creatinine, Ser: 8.1 mg/dL — ABNORMAL HIGH (ref 0.61–1.24)
Glucose, Bld: 64 mg/dL — ABNORMAL LOW (ref 70–99)
HCT: 32 % — ABNORMAL LOW (ref 39.0–52.0)
Hemoglobin: 10.9 g/dL — ABNORMAL LOW (ref 13.0–17.0)
Potassium: 4.3 mmol/L (ref 3.5–5.1)
Sodium: 131 mmol/L — ABNORMAL LOW (ref 135–145)
TCO2: 25 mmol/L (ref 22–32)

## 2019-10-03 LAB — PROTIME-INR
INR: 1 (ref 0.8–1.2)
Prothrombin Time: 12.7 seconds (ref 11.4–15.2)

## 2019-10-03 LAB — DIFFERENTIAL
Abs Immature Granulocytes: 0.03 10*3/uL (ref 0.00–0.07)
Basophils Absolute: 0.1 10*3/uL (ref 0.0–0.1)
Basophils Relative: 1 %
Eosinophils Absolute: 0.4 10*3/uL (ref 0.0–0.5)
Eosinophils Relative: 5 %
Immature Granulocytes: 0 %
Lymphocytes Relative: 18 %
Lymphs Abs: 1.4 10*3/uL (ref 0.7–4.0)
Monocytes Absolute: 0.7 10*3/uL (ref 0.1–1.0)
Monocytes Relative: 10 %
Neutro Abs: 5 10*3/uL (ref 1.7–7.7)
Neutrophils Relative %: 66 %

## 2019-10-03 LAB — LIPID PANEL
Cholesterol: 142 mg/dL (ref 0–200)
HDL: 74 mg/dL (ref >40)
LDL Cholesterol: 54 mg/dL (ref 0–99)
Total CHOL/HDL Ratio: 1.9 RATIO
Triglycerides: 72 mg/dL (ref <150)
VLDL: 14 mg/dL (ref 0–40)

## 2019-10-03 LAB — CBG MONITORING, ED
Glucose-Capillary: 137 mg/dL — ABNORMAL HIGH (ref 70–99)
Glucose-Capillary: 142 mg/dL — ABNORMAL HIGH (ref 70–99)
Glucose-Capillary: 132 mg/dL — ABNORMAL HIGH (ref 70–99)

## 2019-10-03 LAB — HEMOGLOBIN A1C
Hgb A1c MFr Bld: 4.9 % (ref 4.8–5.6)
Mean Plasma Glucose: 93.93 mg/dL

## 2019-10-03 LAB — GLUCOSE, CAPILLARY
Glucose-Capillary: 97 mg/dL (ref 70–99)
Glucose-Capillary: 188 mg/dL — ABNORMAL HIGH (ref 70–99)

## 2019-10-03 LAB — MRSA PCR SCREENING: MRSA by PCR: NEGATIVE

## 2019-10-03 LAB — APTT: aPTT: 26 seconds (ref 24–36)

## 2019-10-03 IMAGING — CT CT HEAD CODE STROKE
4 of 5 series · 14 of 47 positions shown, 16 images · non-contrast
Comparison: None.

CLINICAL DATA: Code stroke. Headache and left-sided weakness with
fixed gaze. Nausea and vomiting.

EXAM:
CT HEAD WITHOUT CONTRAST
TECHNIQUE: Contiguous axial images were obtained from the base of the skull
through the vertex without intravenous contrast.

[Series 3: head wo · axial · 0.42mm/px · z∈[-106,-46]mm · 3 of 32 slices shown (1 of 2)]
[im 7/32  brain]
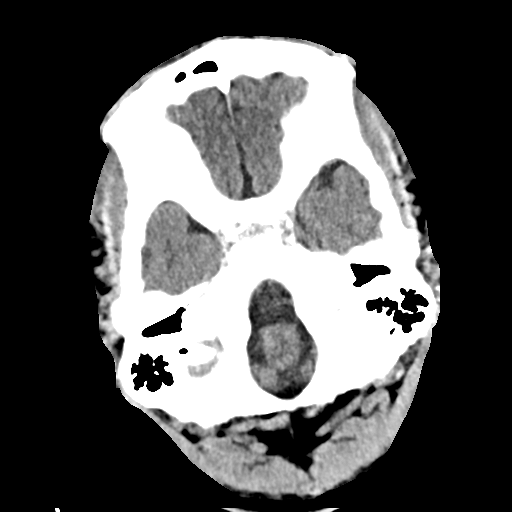
[im 13/32  brain]
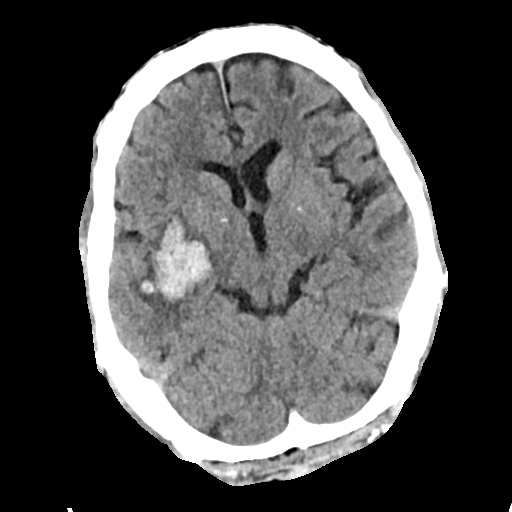
[im 19/32  brain]
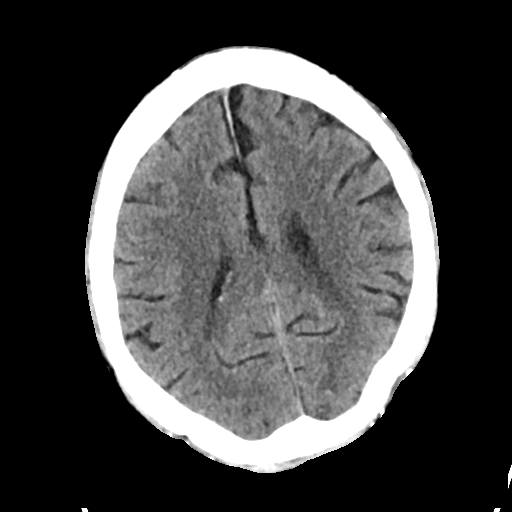

[Series 5: cor soft · coronal · 0.35mm/px · 3 of 66 slices shown]
[im 22/66  brain]
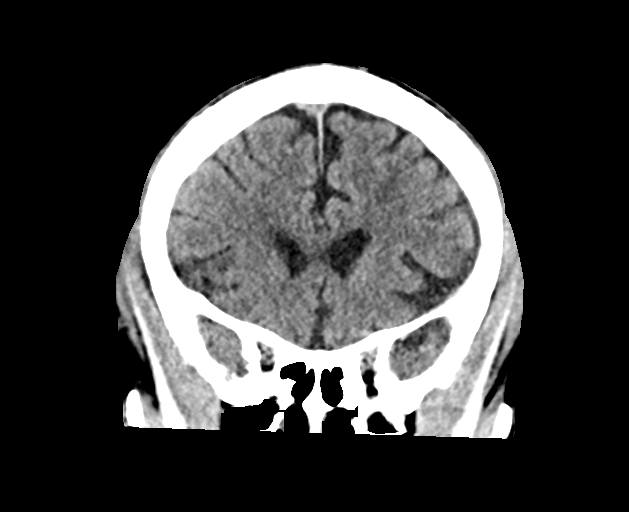
[im 29/66  brain]
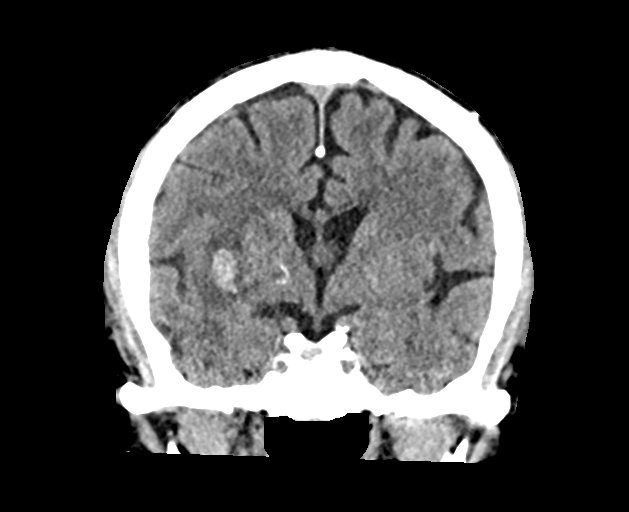
[im 37/66  brain]
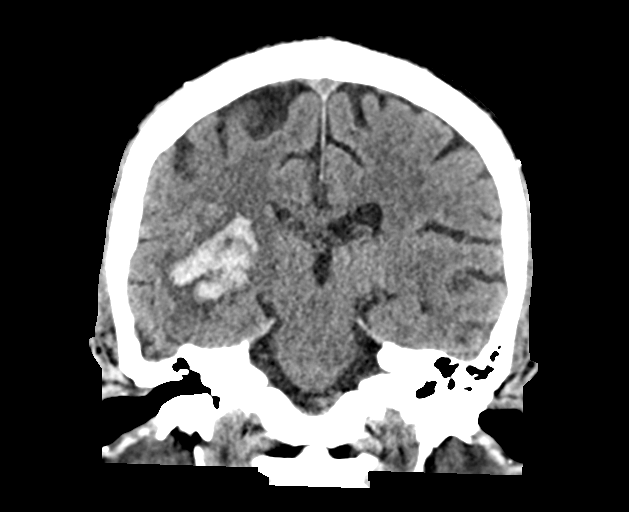

[Series 6: sag soft · sagittal · 0.33mm/px · 3 of 56 slices shown]
[im 19/56  brain]
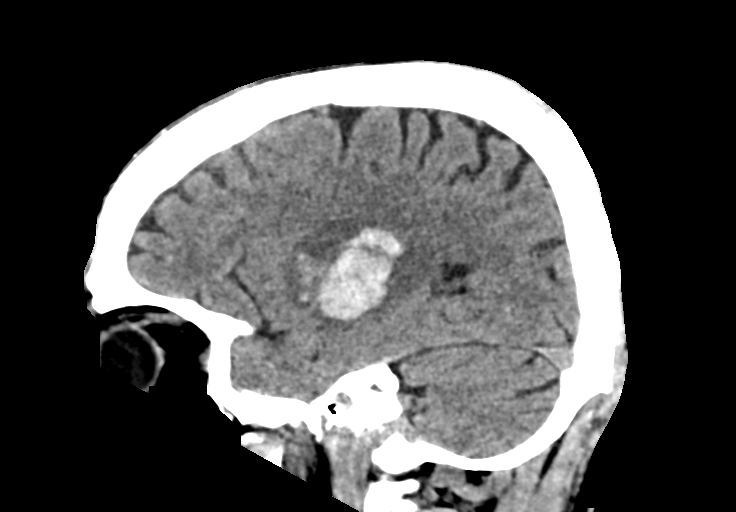
[im 28/56  brain]
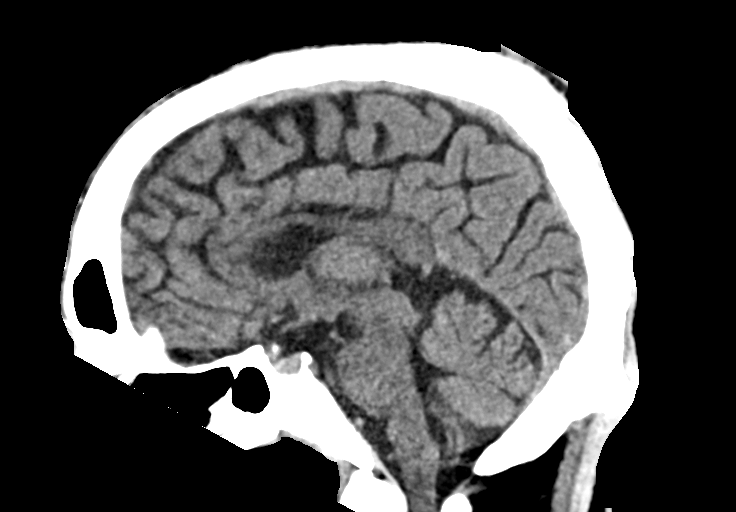
[im 37/56  brain]
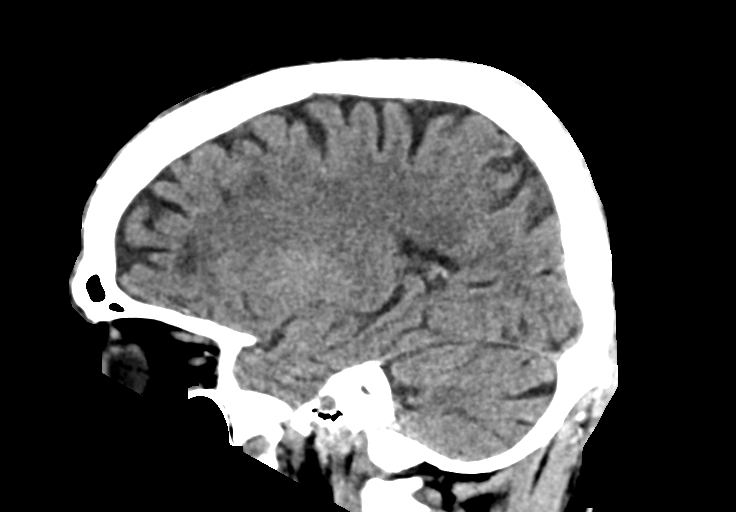

[Series 7: head wo · axial · 0.41mm/px · z∈[-159,-67]mm · 5 of 32 slices shown, 7 images (2 of 2)]
[im 6/32  brain]
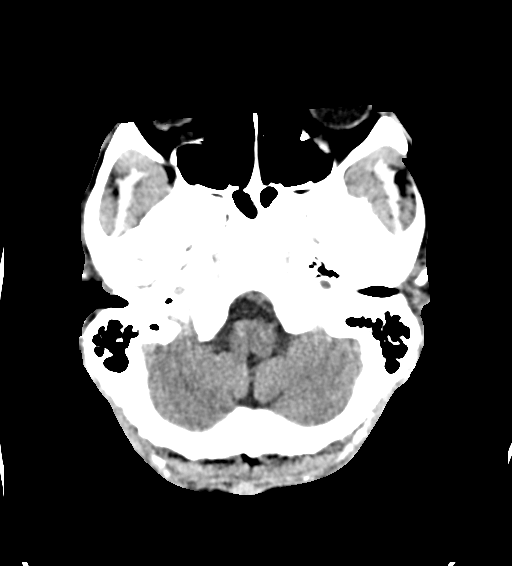
[im 6/32  bone]
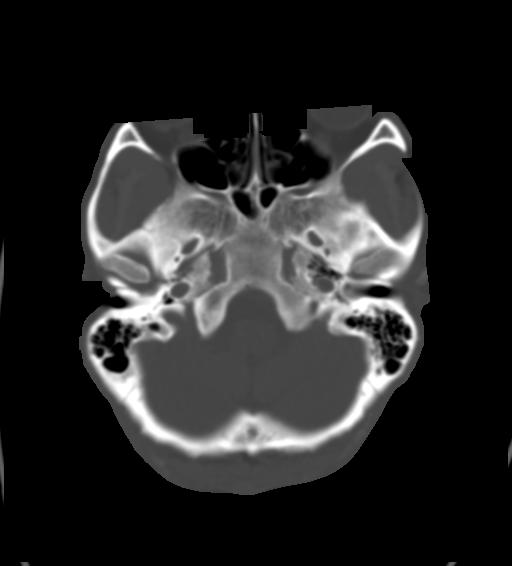
[im 11/32  brain]
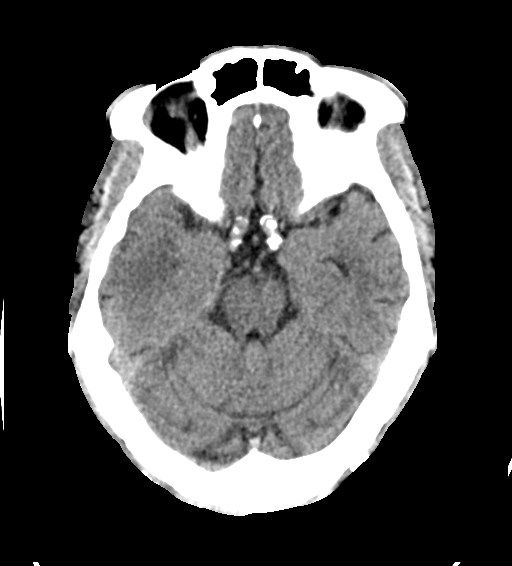
[im 16/32  brain]
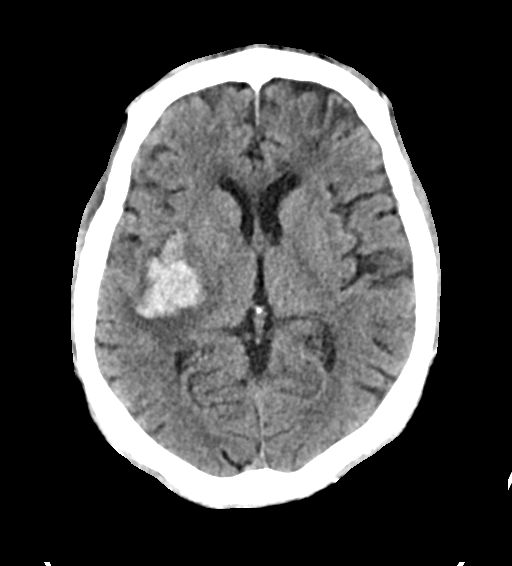
[im 21/32  brain]
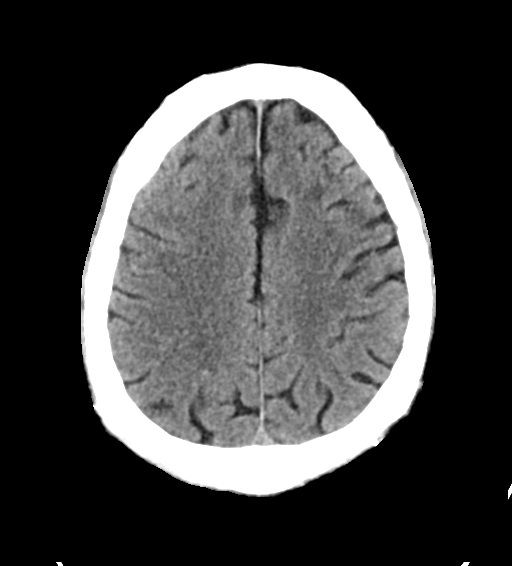
[im 26/32  brain]
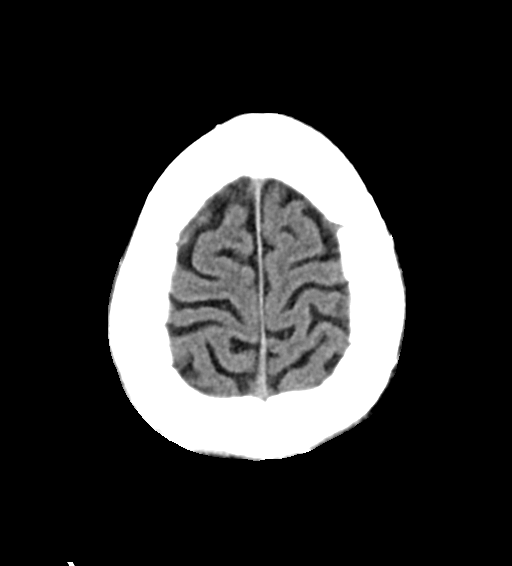
[im 26/32  bone]
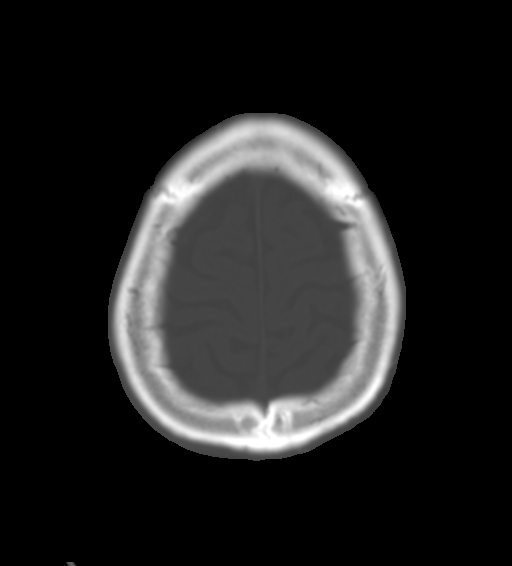

[14 of 47 positions shown; findings below may reference images not displayed]

FINDINGS: Brain: 35 x 31 x 30 mm acute hematoma at the right posterior putamen
and external capsule. No intraventricular extension but there is a
small volume of subarachnoid blood locally. There is a small rim of
adjacent edema without gross underlying cortical infarct or mass. No
hydrocephalus. Small chronic insult at the inferior left frontal
cortex which could be ischemic or traumatic.

Vascular: No hyperdense vessel or unexpected calcification.

Skull: Normal. Negative for fracture or focal lesion.

Sinuses/Orbits: No acute finding.

Other: Critical Value/emergent results were called by telephone at
the time of interpretation on [DATE] at [DATE] to provider
FRANCISCO HEMENEGILDO CANDENGUE, who verbally acknowledged these results.

ASPECTS ([place] Stroke Program Early CT Score)

Not scored in this setting
IMPRESSION: 1. 15 cc acute hematoma at the right putamen and adjacent white
matter. Small volume subarachnoid extension seen locally.
2. Small remote left frontal cortex insult, usually infarct. Small
remote left cerebellar infarct.

## 2019-10-03 IMAGING — CT CT HEAD W/O CM
3 of 5 series · 14 of 47 positions shown, 16 images · non-contrast
Comparison: [DATE] head CT.

CLINICAL DATA: Stroke, follow-up.

EXAM:
CT HEAD WITHOUT CONTRAST
TECHNIQUE: Contiguous axial images were obtained from the base of the skull
through the vertex without intravenous contrast.

[Series 5: head without cor · coronal · non-contrast · 0.33mm/px · 3 of 74 slices shown]
[im 25/74  brain]
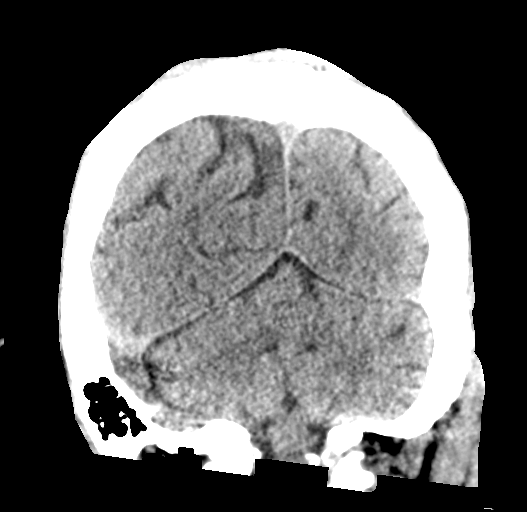
[im 33/74  brain]
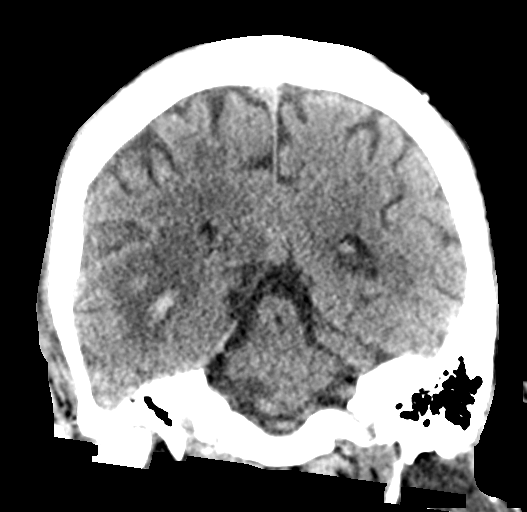
[im 41/74  brain]
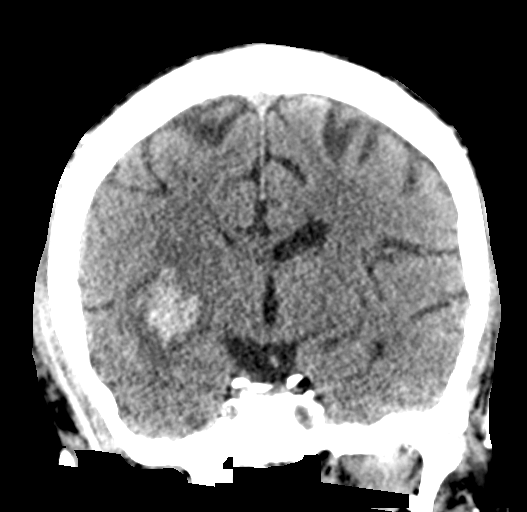

[Series 6: head without sag · sagittal · non-contrast · 0.33mm/px · 3 of 58 slices shown]
[im 22/58  brain]
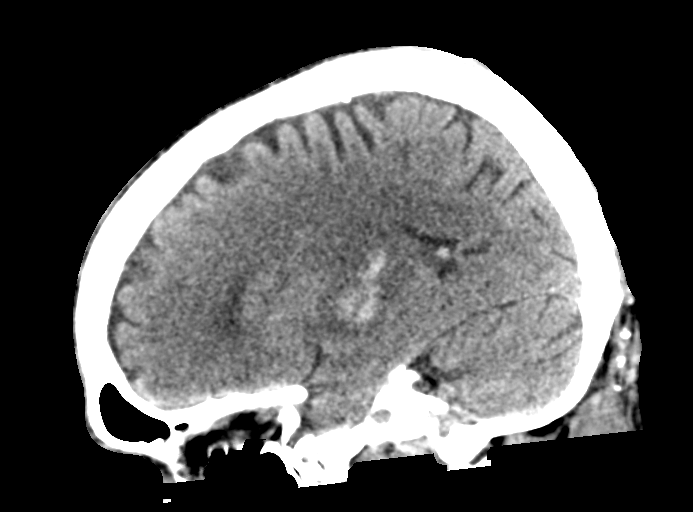
[im 29/58  brain]
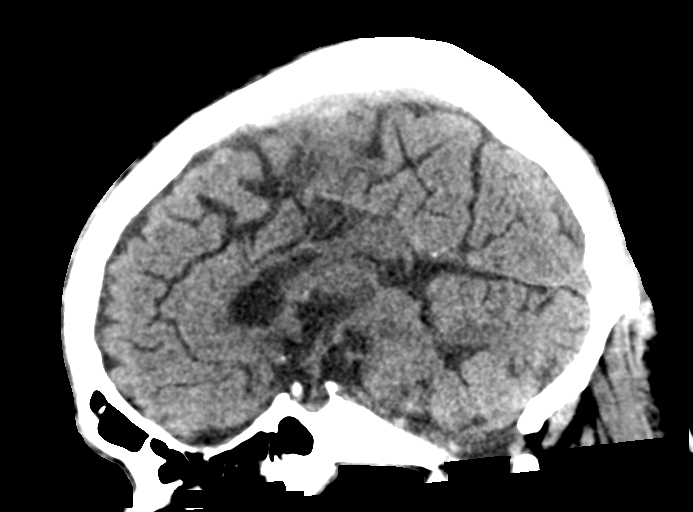
[im 36/58  brain]
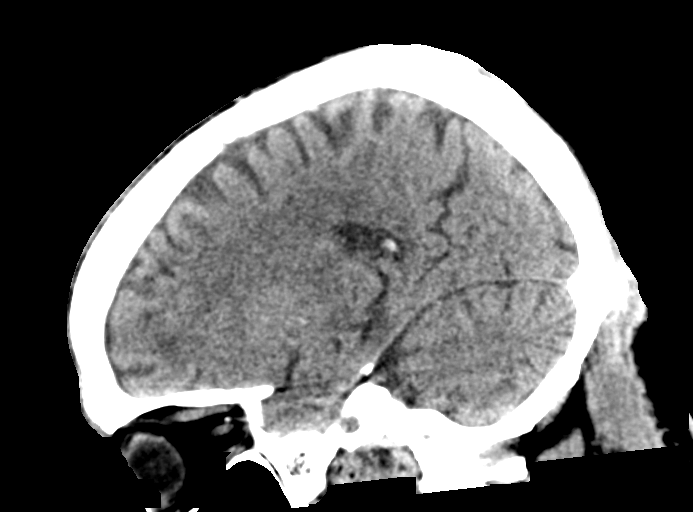

[Series 7: head without ax · axial · non-contrast · 0.43mm/px · z∈[+1516,+1643]mm · 8 of 57 slices shown, 10 images]
[im 7/57  brain]
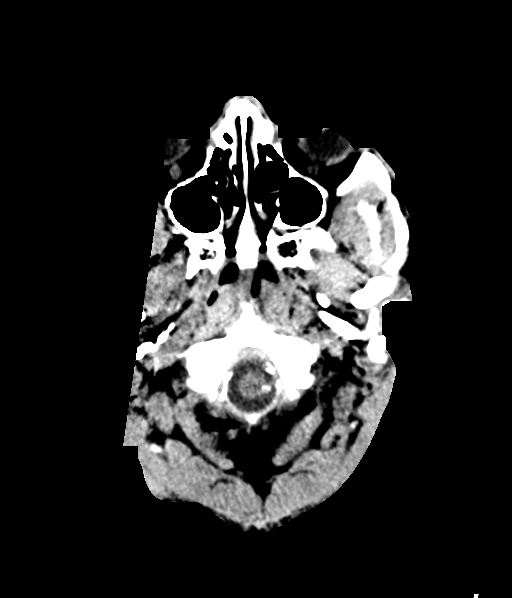
[im 7/57  bone]
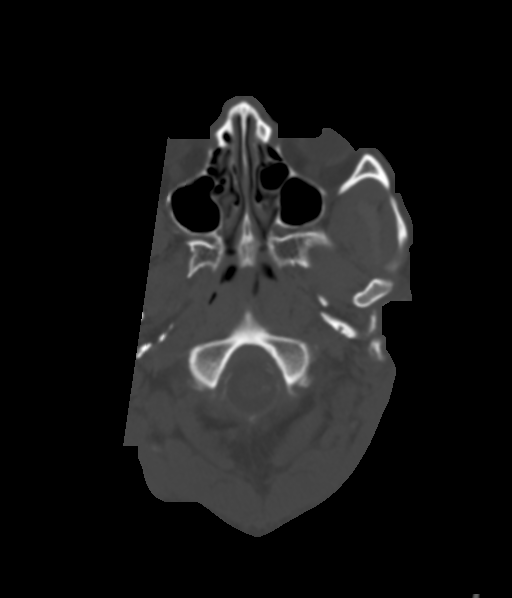
[im 13/57  brain]
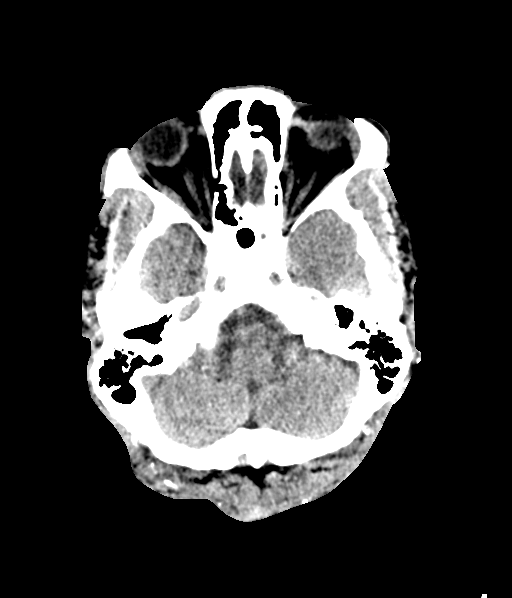
[im 19/57  brain]
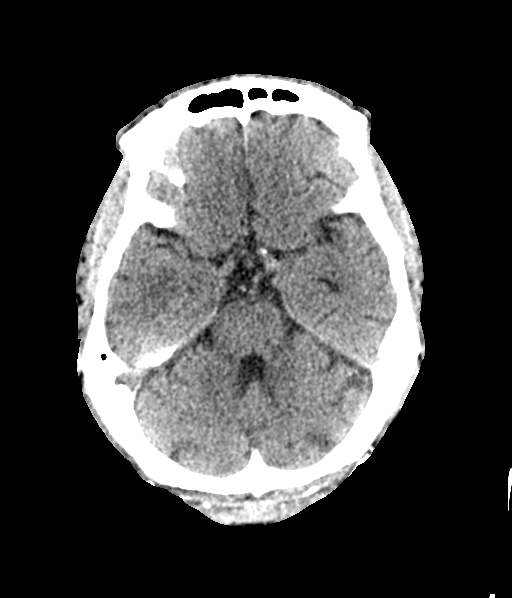
[im 25/57  brain]
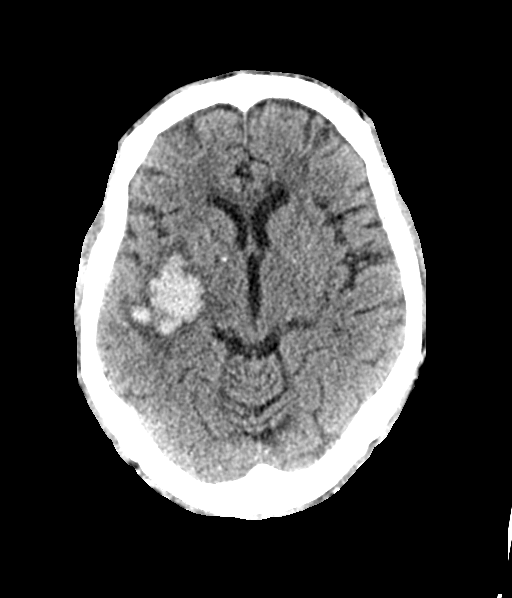
[im 32/57  brain]
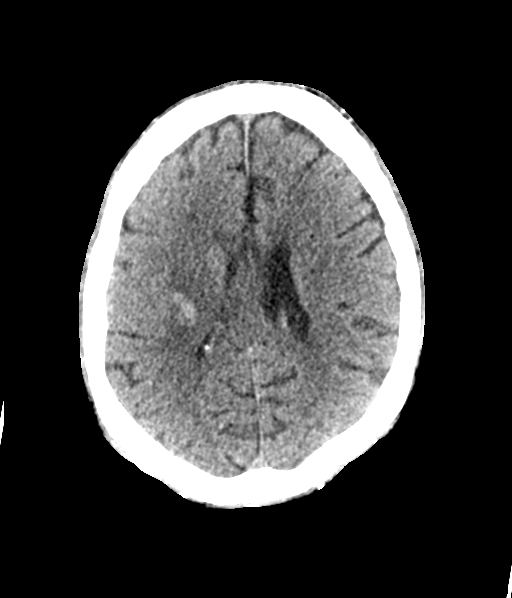
[im 32/57  bone]
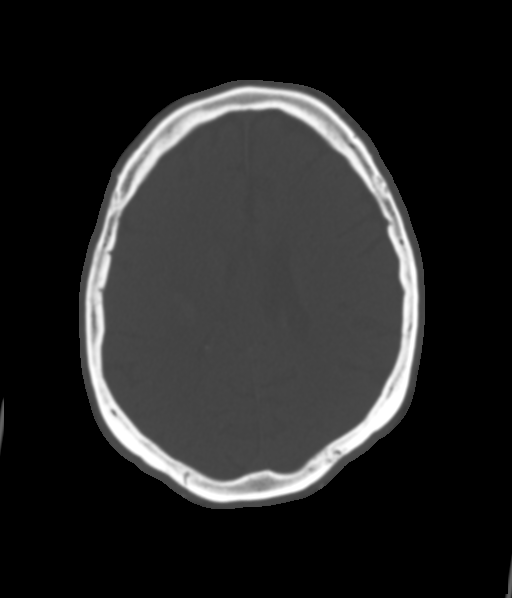
[im 38/57  brain]
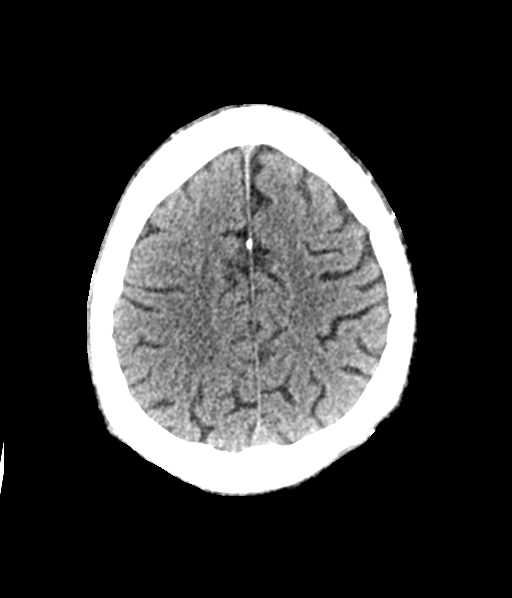
[im 44/57  brain]
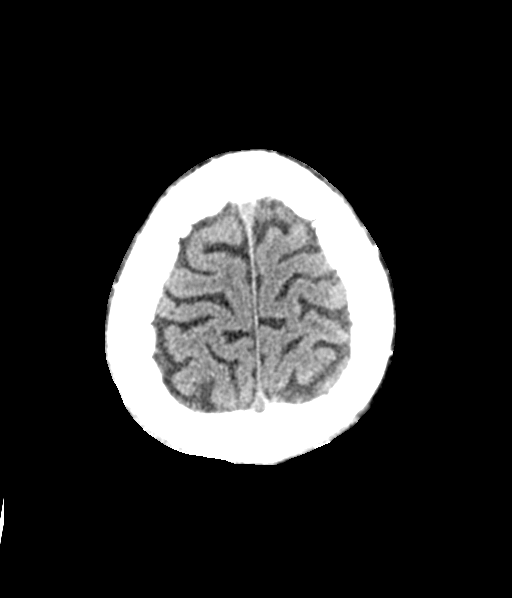
[im 50/57  brain]
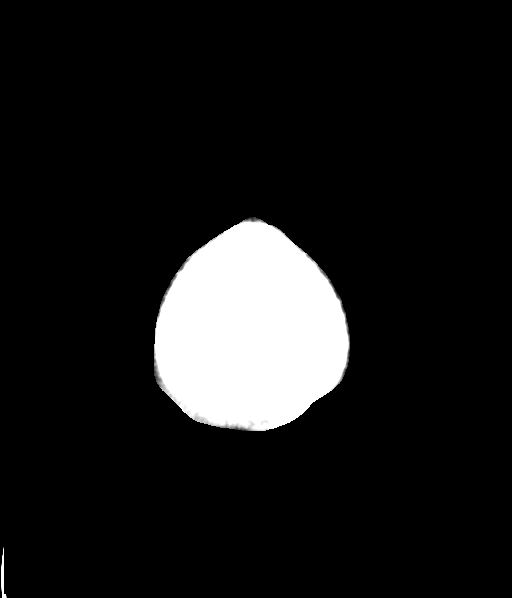

[14 of 47 positions shown; findings below may reference images not displayed]

FINDINGS: Brain: 3.5 x 3.2 cm focal intraparenchymal hemorrhage within the
right putamen and external capsule is grossly unchanged in size.
Similar appearance of peripheral edema. Focal subarachnoid
hemorrhage overlying the left insula is more conspicuous than prior
exam. There is new intraventricular extension of hemorrhage
involving the right lateral ventricle.

Remote left frontal white matter insult, unchanged. No new
hypodensity. No mass lesion. No midline shift, ventriculomegaly or
extra-axial fluid collection.

Vascular: No hyperdense vessel. Bilateral skull base atherosclerotic
calcifications.

Skull: No acute finding.

Sinuses/Orbits: No acute finding.

Other: None.
IMPRESSION: Right putamen/external capsule intraparenchymal hematoma measuring
3.5 cm is unchanged.

New intraventricular extension of hemorrhage involving the right
lateral ventricle.

Subarachnoid hemorrhage overlying the left insula is more
conspicuous than prior exam.

## 2019-10-03 IMAGING — DX DG CHEST 1V PORT
1 series · 1 of 1 positions shown · non-contrast
Comparison: [DATE]

CLINICAL DATA: Code stroke, cough.

EXAM:
PORTABLE CHEST 1 VIEW

[chest]
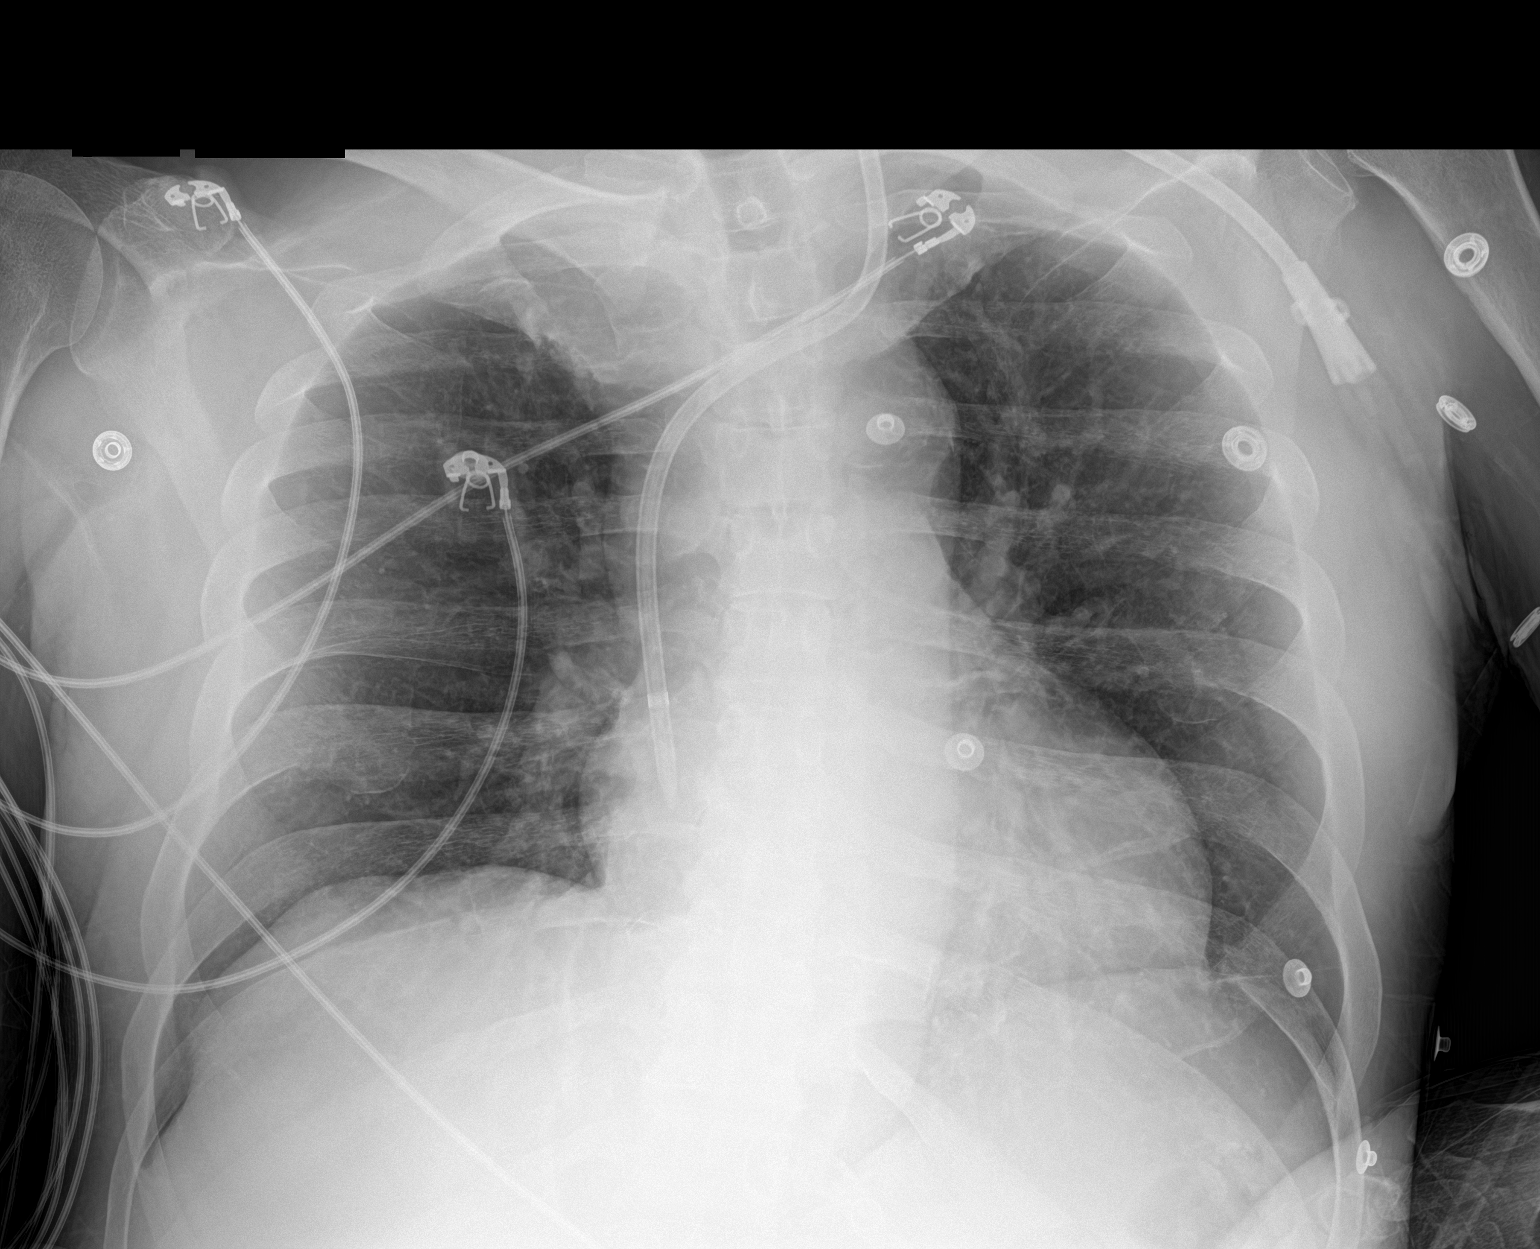

[1 of 1 positions shown; findings below may reference images not displayed]

FINDINGS: Mild enlargement the cardiac silhouette. No consolidation. Left
basilar atelectasis. The visualized skeletal structures are
unremarkable. Similar positioning of a left-sided venous catheter
with the distal tip projecting at the right atrium.
IMPRESSION: 1. No acute cardiopulmonary disease.
2. Mild left basilar atelectasis.
3. Mild cardiomegaly.
4. Similar position of the left-sided central venous catheter.

## 2019-10-03 MED ORDER — ACETAMINOPHEN 160 MG/5ML PO SOLN: 650.0000 mg | ORAL | Status: DC | PRN

## 2019-10-03 MED ORDER — CLEVIDIPINE BUTYRATE 0.5 MG/ML IV EMUL
INTRAVENOUS | Status: AC
  Administered 2019-10-03: 1 mg/h via INTRAVENOUS
  Filled 2019-10-03: qty 50

## 2019-10-03 MED ORDER — GABAPENTIN 300 MG PO CAPS: 300.0000 mg | ORAL_CAPSULE | Freq: Two times a day (BID) | ORAL | Status: DC

## 2019-10-03 MED ORDER — ACETAMINOPHEN 325 MG PO TABS
650.0000 mg | ORAL_TABLET | ORAL | Status: DC | PRN
  Administered 2019-10-09 – 2019-10-12 (×2): 650 mg via ORAL
  Filled 2019-10-03 (×2): qty 2

## 2019-10-03 MED ORDER — SODIUM CHLORIDE 0.9% FLUSH: 3.0000 mL | Freq: Once | INTRAVENOUS | Status: DC

## 2019-10-03 MED ORDER — SODIUM CHLORIDE 0.9 % IV SOLN
125.0000 mg | INTRAVENOUS | Status: AC
  Administered 2019-10-08 – 2019-10-10 (×2): 125 mg via INTRAVENOUS
  Filled 2019-10-03 (×4): qty 10

## 2019-10-03 MED ORDER — RENA-VITE PO TABS
1.0000 | ORAL_TABLET | Freq: Every day | ORAL | Status: DC
  Administered 2019-10-03 – 2019-10-15 (×12): 1 via ORAL
  Filled 2019-10-03 (×14): qty 1

## 2019-10-03 MED ORDER — CALCITRIOL 0.25 MCG PO CAPS
1.0000 ug | ORAL_CAPSULE | ORAL | Status: DC
  Administered 2019-10-06 – 2019-10-16 (×6): 1 ug via ORAL
  Filled 2019-10-03 (×5): qty 4

## 2019-10-03 MED ORDER — DEXTROSE 50 % IV SOLN
25.0000 g | Freq: Once | INTRAVENOUS | Status: AC
  Administered 2019-10-03: 25 g via INTRAVENOUS

## 2019-10-03 MED ORDER — ACETAMINOPHEN 650 MG RE SUPP: 650.0000 mg | RECTAL | Status: DC | PRN

## 2019-10-03 MED ORDER — LABETALOL HCL 5 MG/ML IV SOLN: 20.0000 mg | Freq: Once | INTRAVENOUS | Status: DC

## 2019-10-03 MED ORDER — CLEVIDIPINE BUTYRATE 0.5 MG/ML IV EMUL
0.0000 mg/h | INTRAVENOUS | Status: DC
  Administered 2019-10-03: 8 mg/h via INTRAVENOUS
  Administered 2019-10-03: 10 mg/h via INTRAVENOUS
  Filled 2019-10-03 (×4): qty 50

## 2019-10-03 MED ORDER — SENNOSIDES-DOCUSATE SODIUM 8.6-50 MG PO TABS
1.0000 | ORAL_TABLET | Freq: Two times a day (BID) | ORAL | Status: DC
  Administered 2019-10-03 – 2019-10-15 (×11): 1 via ORAL
  Filled 2019-10-03 (×17): qty 1

## 2019-10-03 MED ORDER — STROKE: EARLY STAGES OF RECOVERY BOOK
Freq: Once | Status: AC
  Filled 2019-10-03: qty 1

## 2019-10-03 MED ORDER — GABAPENTIN 300 MG PO CAPS
300.0000 mg | ORAL_CAPSULE | Freq: Two times a day (BID) | ORAL | Status: DC
  Administered 2019-10-03 – 2019-10-05 (×4): 300 mg via ORAL
  Filled 2019-10-03 (×5): qty 1

## 2019-10-03 MED ORDER — PANTOPRAZOLE SODIUM 40 MG IV SOLR
40.0000 mg | Freq: Every day | INTRAVENOUS | Status: DC
  Administered 2019-10-03: 40 mg via INTRAVENOUS

## 2019-10-03 MED ORDER — CHLORHEXIDINE GLUCONATE CLOTH 2 % EX PADS
6.0000 | MEDICATED_PAD | Freq: Every day | CUTANEOUS | Status: DC
  Administered 2019-10-05: 6 via TOPICAL

## 2019-10-03 MED ORDER — INSULIN ASPART 100 UNIT/ML ~~LOC~~ SOLN
0.0000 [IU] | Freq: Three times a day (TID) | SUBCUTANEOUS | Status: DC
  Administered 2019-10-04 (×2): 1 [IU] via SUBCUTANEOUS
  Administered 2019-10-05: 2 [IU] via SUBCUTANEOUS
  Administered 2019-10-07: 1 [IU] via SUBCUTANEOUS
  Administered 2019-10-10: 2 [IU] via SUBCUTANEOUS
  Administered 2019-10-11: 1 [IU] via SUBCUTANEOUS
  Administered 2019-10-11: 2 [IU] via SUBCUTANEOUS
  Administered 2019-10-11 – 2019-10-15 (×5): 1 [IU] via SUBCUTANEOUS

## 2019-10-03 NOTE — ED Notes (Signed)
Call sister Ercole Georg at [phone]

## 2019-10-03 NOTE — ED Notes (Signed)
Cleviprex at 8mg /hr. Pt BP in desired range x4. Pt resting comfortably in bed.
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
## 2019-10-03 NOTE — Progress Notes (Signed)
Patient refused MRI once downstairs. MD aware. Will try to convince pt to go for follow-up CT scan. Lorence Nagengast, Rande Brunt, RN
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
## 2019-10-04 ENCOUNTER — Inpatient Hospital Stay (HOSPITAL_COMMUNITY): Payer: Medicare Other

## 2019-10-04 ENCOUNTER — Encounter (HOSPITAL_COMMUNITY): Payer: Self-pay | Admitting: Student in an Organized Health Care Education/Training Program

## 2019-10-04 DIAGNOSIS — I610 Nontraumatic intracerebral hemorrhage in hemisphere, subcortical: Secondary | ICD-10-CM | POA: Diagnosis not present

## 2019-10-04 DIAGNOSIS — I6389 Other cerebral infarction: Secondary | ICD-10-CM

## 2019-10-04 LAB — GLUCOSE, CAPILLARY
Glucose-Capillary: 166 mg/dL — ABNORMAL HIGH (ref 70–99)
Glucose-Capillary: 154 mg/dL — ABNORMAL HIGH (ref 70–99)
Glucose-Capillary: 193 mg/dL — ABNORMAL HIGH (ref 70–99)

## 2019-10-04 LAB — ECHOCARDIOGRAM COMPLETE
Area-P 1/2: 2.34 cm2
Height: 71 in
S' Lateral: 3.1 cm
Weight: 2529.12 oz

## 2019-10-04 IMAGING — CT CT ANGIO NECK
3 of 11 series · 8 of 34 positions shown · IV contrast (APPLIED)
Comparison: [DATE] head CTs.

CLINICAL DATA: CTA head and neck.

EXAM:
CT ANGIOGRAPHY HEAD AND NECK
TECHNIQUE: Multidetector CT imaging of the head and neck was performed using
the standard protocol during bolus administration of intravenous
contrast. Multiplanar CT image reconstructions and MIPs were
obtained to evaluate the vascular anatomy. Carotid stenosis
measurements (when applicable) are obtained utilizing NASCET
criteria, using the distal internal carotid diameter as the
denominator.
CONTRAST:  50mL OMNIPAQUE IOHEXOL 350 MG/ML SOLN

[Series 8: sag soft · sagittal · 0.32mm/px · 1 of 54 slices shown]
[im 44/54  soft-tissue]
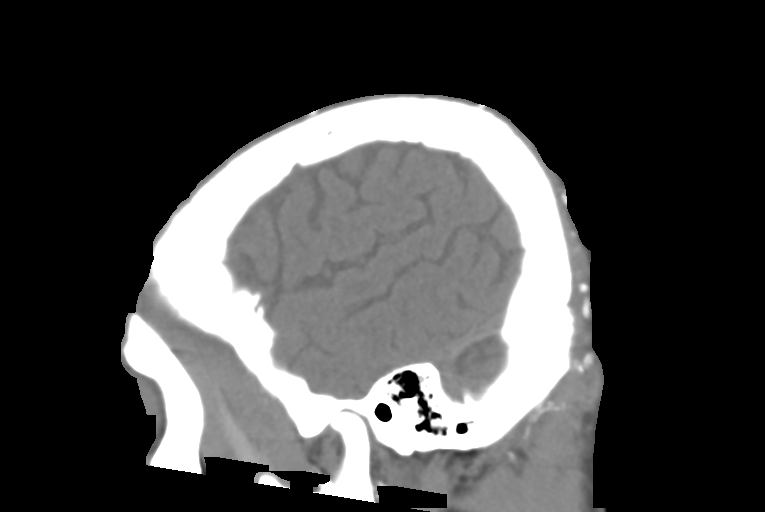

[Series 9: cta neck/head · axial · 0.55mm/px · z∈[-259,-135]mm · 2 of 186 slices shown]
[im 62/186  soft-tissue]
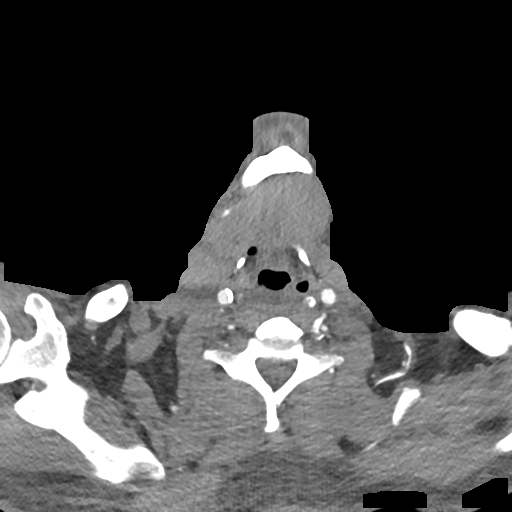
[im 124/186  soft-tissue]
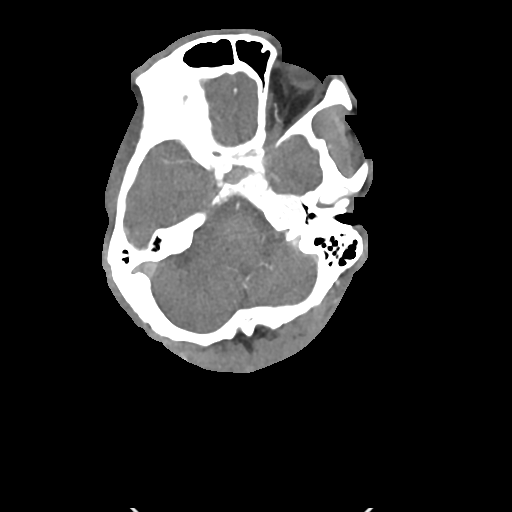

[Series 11: ax thins · axial · 0.49mm/px · z∈[-362,-118]mm · 5 of 375 slices shown]
[im 63/375  soft-tissue]
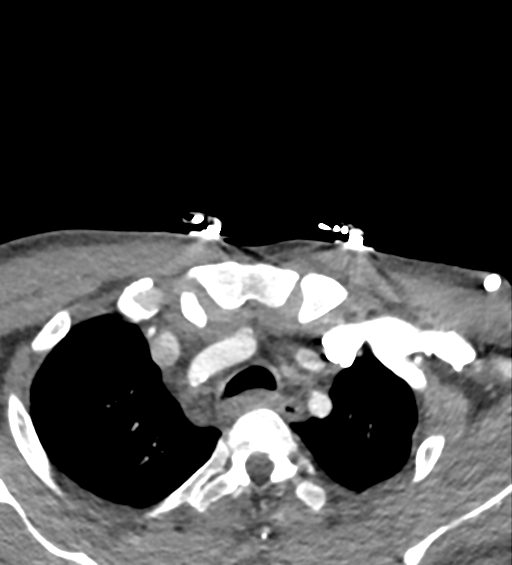
[im 125/375  bone]
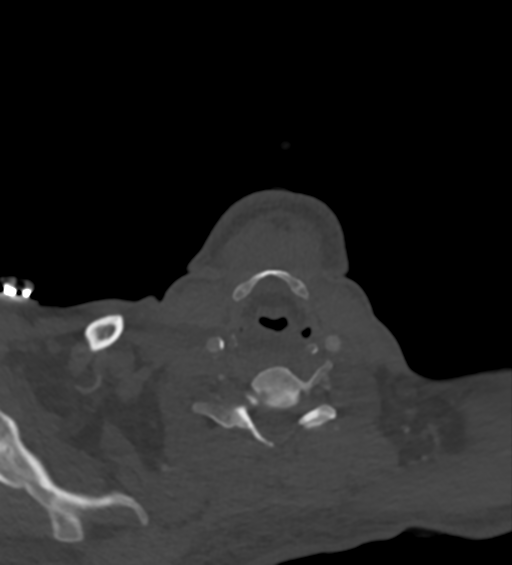
[im 188/375  soft-tissue]
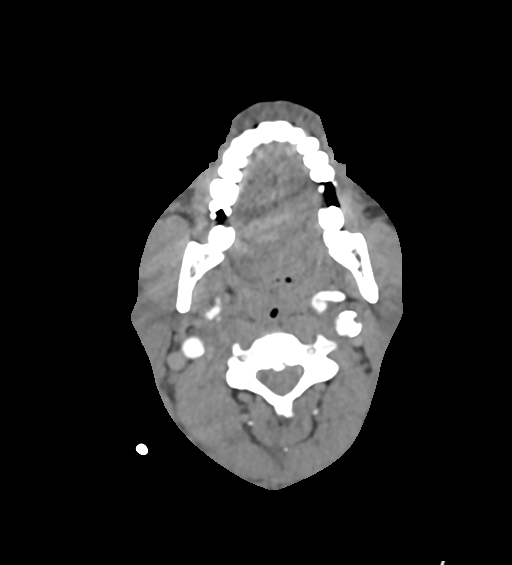
[im 250/375  bone]
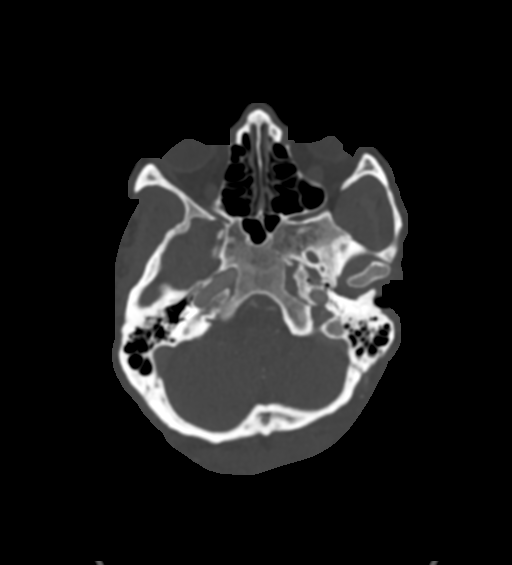
[im 312/375  soft-tissue]
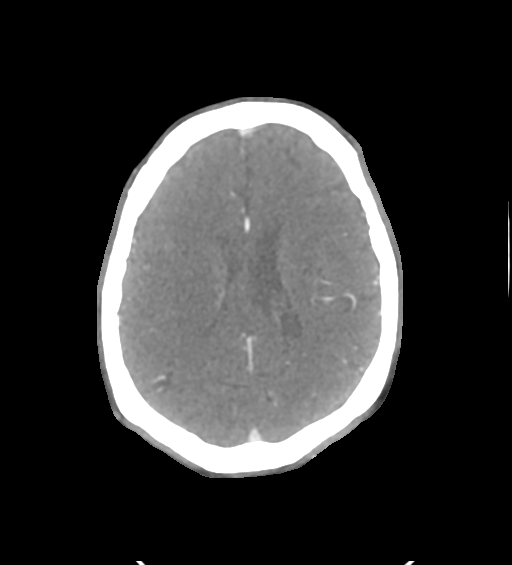

[8 of 34 positions shown; findings below may reference images not displayed]

FINDINGS: CT HEAD FINDINGS

Brain: 3.3 x 3.1 cm intraparenchymal hemorrhage within the right
putamen and external capsule is slightly less conspicuous than prior
exam, previously 3.5 x 3.2 cm. Minimal intraventricular extension
involving the right lateral ventricle is unchanged. Left insular
subarachnoid hemorrhage is unchanged.

Remote left frontal white matter insult. No mass lesion. No midline
shift, ventriculomegaly or extra-axial fluid collection.

Vascular: Please see CTA.

Skull: Negative for fracture or focal lesion.

Sinuses/Orbits: Normal orbits. Clear paranasal sinuses. No mastoid
effusion.

Other: None.

Review of the MIP images confirms the above findings

CTA NECK FINDINGS

Aortic arch: Standard branching. Mild aortic arch atherosclerotic
calcifications. Imaged portion shows no evidence of aneurysm or
dissection. No significant stenosis of the major arch vessel
origins.

Right carotid system: No evidence of dissection, stenosis (50% or
greater) or occlusion. Proximal CCA atherosclerotic calcifications
with 20% luminal narrowing. Carotid bifurcation atherosclerotic
calcifications.

Left carotid system: No evidence of dissection, stenosis (50% or
greater) or occlusion. Circumferential proximal CCA atherosclerotic
calcifications with 30% luminal narrowing. Carotid bifurcation
atherosclerotic calcifications with approximately 40% narrowing of
the proximal ICA.

Vertebral arteries: Codominant. No evidence of dissection, stenosis
(50% or greater) or occlusion.

Skeleton: No acute or suspicious osseous abnormalities.

Other neck: No adenopathy.  No soft tissue mass.

Upper chest: Dependent atelectasis. Prominent to mildly enlarged
superior mediastinal nodes.

Review of the MIP images confirms the above findings

CTA HEAD FINDINGS

Anterior circulation: Bilateral carotid siphon atherosclerotic
calcifications without significant narrowing of the intracavernous
segments. No significant stenosis, proximal occlusion, aneurysm, or
vascular malformation.

Posterior circulation: Codominant vertebral arteries. Fetal origin
of the bilateral PCAs. No significant stenosis, proximal occlusion,
aneurysm, or vascular malformation.

Venous sinuses: As permitted by contrast timing, patent.

Anatomic variants: Detailed above.

Review of the MIP images confirms the above findings
IMPRESSION: CT head:

Right putamen/external capsule intraparenchymal hematoma measuring
3.3 cm, less conspicuous than prior exam.

Trace intraventricular hemorrhage involving the right lateral
ventricle is unchanged.

Left insular subarachnoid hemorrhage, unchanged.

CTA head and neck:

40% proximal left ICA narrowing. 20-30% narrowing of the bilateral
common carotids.

No large vessel occlusion, high-grade narrowing, dissection or
aneurysm involving the major intracranial vessels.

## 2019-10-04 MED ORDER — LABETALOL HCL 5 MG/ML IV SOLN: 20.0000 mg | INTRAVENOUS | Status: DC | PRN

## 2019-10-04 MED ORDER — SODIUM CHLORIDE 0.9 % IV SOLN: 100.0000 mL | INTRAVENOUS | Status: DC | PRN

## 2019-10-04 MED ORDER — SEVELAMER CARBONATE 800 MG PO TABS
3200.0000 mg | ORAL_TABLET | Freq: Three times a day (TID) | ORAL | Status: DC
  Administered 2019-10-04 – 2019-10-05 (×4): 3200 mg via ORAL
  Filled 2019-10-04 (×6): qty 4

## 2019-10-04 MED ORDER — INSULIN ASPART 100 UNIT/ML ~~LOC~~ SOLN
3.0000 [IU] | Freq: Three times a day (TID) | SUBCUTANEOUS | Status: DC
  Administered 2019-10-04 – 2019-10-07 (×4): 3 [IU] via SUBCUTANEOUS

## 2019-10-04 MED ORDER — INSULIN GLARGINE 100 UNIT/ML ~~LOC~~ SOLN
10.0000 [IU] | Freq: Every day | SUBCUTANEOUS | Status: DC
  Administered 2019-10-05 – 2019-10-08 (×5): 10 [IU] via SUBCUTANEOUS
  Filled 2019-10-04 (×9): qty 0.1

## 2019-10-04 MED ORDER — VITAMIN D (ERGOCALCIFEROL) 1.25 MG (50000 UNIT) PO CAPS
50000.0000 [IU] | ORAL_CAPSULE | ORAL | Status: DC
  Administered 2019-10-05 – 2019-10-10 (×2): 50000 [IU] via ORAL
  Filled 2019-10-04 (×3): qty 1

## 2019-10-04 MED ORDER — CARVEDILOL 12.5 MG PO TABS
12.5000 mg | ORAL_TABLET | Freq: Two times a day (BID) | ORAL | Status: DC
  Administered 2019-10-04 – 2019-10-15 (×19): 12.5 mg via ORAL
  Filled 2019-10-04 (×19): qty 1

## 2019-10-04 MED ORDER — ALTEPLASE 2 MG IJ SOLR: 2.0000 mg | Freq: Once | INTRAMUSCULAR | Status: DC | PRN

## 2019-10-04 MED ORDER — LIDOCAINE HCL (PF) 1 % IJ SOLN: 5.0000 mL | INTRAMUSCULAR | Status: DC | PRN

## 2019-10-04 MED ORDER — PANTOPRAZOLE SODIUM 40 MG PO TBEC
40.0000 mg | DELAYED_RELEASE_TABLET | Freq: Every day | ORAL | Status: DC
  Administered 2019-10-05 – 2019-10-15 (×11): 40 mg via ORAL
  Filled 2019-10-04 (×5): qty 1
  Filled 2019-10-04: qty 2
  Filled 2019-10-04 (×6): qty 1

## 2019-10-04 MED ORDER — LIDOCAINE-PRILOCAINE 2.5-2.5 % EX CREA: 1.0000 "application " | TOPICAL_CREAM | CUTANEOUS | Status: DC | PRN

## 2019-10-04 MED ORDER — HEPARIN SODIUM (PORCINE) 1000 UNIT/ML DIALYSIS: 1000.0000 [IU] | INTRAMUSCULAR | Status: DC | PRN

## 2019-10-04 MED ORDER — AMLODIPINE BESYLATE 10 MG PO TABS
10.0000 mg | ORAL_TABLET | Freq: Every day | ORAL | Status: DC
  Administered 2019-10-05 – 2019-10-15 (×10): 10 mg via ORAL
  Filled 2019-10-04 (×10): qty 1

## 2019-10-04 MED ORDER — PENTAFLUOROPROP-TETRAFLUOROETH EX AERO: 1.0000 "application " | INHALATION_SPRAY | CUTANEOUS | Status: DC | PRN

## 2019-10-04 MED ORDER — DARBEPOETIN ALFA 200 MCG/0.4ML IJ SOSY: 200.0000 ug | PREFILLED_SYRINGE | INTRAMUSCULAR | Status: DC

## 2019-10-04 MED ORDER — ATORVASTATIN CALCIUM 10 MG PO TABS
20.0000 mg | ORAL_TABLET | Freq: Every day | ORAL | Status: DC
  Administered 2019-10-05 – 2019-10-15 (×11): 20 mg via ORAL
  Filled 2019-10-04 (×12): qty 2

## 2019-10-04 MED ORDER — FUROSEMIDE 20 MG PO TABS
20.0000 mg | ORAL_TABLET | Freq: Every day | ORAL | Status: DC
  Administered 2019-10-05 – 2019-10-15 (×12): 20 mg via ORAL
  Filled 2019-10-04 (×12): qty 1

## 2019-10-04 MED ORDER — IOHEXOL 350 MG/ML SOLN
50.0000 mL | Freq: Once | INTRAVENOUS | Status: AC | PRN
  Administered 2019-10-04: 50 mL via INTRAVENOUS

## 2019-10-04 NOTE — Progress Notes (Signed)
OT Cancellation Note  Patient Details Name: Jorge [name] MRN: [id number] DOB: Apr 02, 1970   Cancelled Treatment:    Reason Eval/Treat Not Completed: Patient at procedure or test/ unavailable Pt getting ECHO in room, and then planned HD. OT will continue to follow for evaluation as schedule allows.  [place] 10/04/2019, 11:03 AM   Jesse Sans OTR/L Acute Rehabilitation Services Pager: [phone] Office: [phone]

## 2019-10-04 NOTE — Progress Notes (Signed)
Pt transported over via stretcher from 4N by two RN. Pt's belongings brought over with pt. Pt alert and oriented x4 in NAD. Respirations even and unlabored on room air. VSS. Assessment completed. Pt oriented to room. Pt instructed on use of call bell. Call bell placed within pt's reach.

## 2019-10-04 NOTE — Progress Notes (Signed)
[facility] Progress Note  Subjective: pt seen in ICU, no new c/o  Vitals:   10/04/19 0600 10/04/19 0700 10/04/19 0715 10/04/19 0800  BP: 115/66 (!) 121/59 (!) 119/58 (!) 126/53  Pulse: 66 66 65 66  Resp: 14 13 19 13   Temp:      TempSrc:      SpO2: 100% 100% 100% 97%  Weight:      Height:        Exam: General: WDWN NAD, chron ill appearing Lungs: CTA bilaterally  Heart: RRR with S1 S2.  Abdomen: soft NT + BS Ext: bilat BKA, no edema Neuro: A & O  X 3. Moves all extremities spontaneously. Dialysis Access: [facility] chest/ R BVT AVF  CXR - IMPRESSION: 1. No acute cardiopulmonary disease. 2. Mild left basilar atelectasis. 3. Mild cardiomegaly. 4. Similar position of the left-sided central venous catheter.  Dialysis Orders: [facility]  4h  68kg  2/2.5 bath  Hep 4200 (holding bolus heparin for now / ok for heparin block to [facility])  R BVT AVF (08/28/19, Dr Donnetta Hutching, maturing)  + L IJ TDC  - mircera 200 q 2 last 9/16  - venofer 100 x 3 more doses  - calcitriol 1 ug tiw Recent labs: hgb 9 trending up tsat 18%   Assessment/Plan: 1.  Hemorrhagic R brain CVA -per Neurology, hold heparin on HD. Ok to use to block [facility].  2.  ESRD -  TTS HD. Plan HD later today in ICU. BP's soft, up 3kg by wts but no vol ^ on exam. Small UF as tol.  3.  HTN/ volume - Started on IV cleviprex, keep SBP 100- 140. Is not getting his home BP meds here. BP's are low-normal to normal, not sure if getting Cleviprex (turned off in room) now.  4.  Anemia  - hgb trending up as outpt - most recent 9 tsat 18%  Continue repletion of Fe 5.  Metabolic bone disease -  Continue binders and VDRA - P much improved lately in outpatient setting on 4 renvela ac 6.  Nutrition - renal carb mod with fluid restriction /added multivit     Jorge [name] Hilgers 10/04/2019, 9:19 AM   Recent Labs  Lab 10/03/19 0804 10/03/19 0812  K 4.6 4.3  BUN 38* 38*  CREATININE 7.59* 8.10*  CALCIUM 9.0  --   HGB 9.7* 10.9*    Inpatient medications: . [START ON 10/06/2019] calcitRIOL  1 mcg Oral Q T,Th,Sa-HD  . Chlorhexidine Gluconate Cloth  6 each Topical Q0600  . gabapentin  300 mg Oral BID  . insulin aspart  0-6 Units Subcutaneous TID WC  . labetalol  20 mg Intravenous Once  . multivitamin  1 tablet Oral QHS  . pantoprazole (PROTONIX) IV  40 mg Intravenous QHS  . senna-docusate  1 tablet Oral BID  . sodium chloride flush  3 mL Intravenous Once   . clevidipine Stopped (10/04/19 0020)  . [START ON 10/06/2019] ferric gluconate (FERRLECIT/NULECIT) IV     acetaminophen **OR** acetaminophen (TYLENOL) oral liquid 160 mg/5 mL **OR** acetaminophen

## 2019-10-04 NOTE — Progress Notes (Signed)
STROKE TEAM PROGRESS NOTE   HISTORY OF PRESENT ILLNESS (per record) Jorge [name] is a 49 y.o. male past medical history of diabetes with gastroparesis, hypertension, BKA, ESRD on peritoneal dialysis, with no prior medical history of strokes, presented to the emergency room for sudden onset of left-sided weakness and confusion. Last known normal 9 PM on 10/02/2019.  Seen again by family this morning and speech was slurred.  Was complaining of a headache.  Appeared more drowsy to family.  EMS was called.  On EMS evaluation, exhibited symptoms of left-sided weakness and fast ED calculator showing an LVO score of 4 prompting activation of an LVO positive code stroke. Brought into the emergency room, evaluated at the bridge-NIH stroke scale of 11 consistent with a right hemispheric stroke.  Stat CT performed, consistent with 15 cc hematoma in the right putamen and adjacent white matter with a small volume subarachnoid extension locally.  Small remote left frontal cortex insult and small remote left cerebellar infarct. Upon arrival: Blood sugars in the 25E, systolic blood pressure 527P. Given D50 prior to CT. LKW: 10/02/2019 9 PM tpa given?: no, ICH Premorbid modified Rankin scale (mRS): 3 ICH Score 0   INTERVAL HISTORY His RN is at the bedside.  He is sitting up in bed comfortably.  He appears sleepy but arouses easily and is appropriate and follows commands.  Blood pressures controlled on Cleviprex drip.  Neuro exam stable.  Follow-up CT scan shows stable appearance of the right putaminal hemorrhage.    OBJECTIVE Vitals:   10/03/19 2330 10/03/19 2345 10/04/19 0000 10/04/19 0400  BP: (!) 102/46 111/64    Pulse: 67 68    Resp: 12 12    Temp:   98.3 F (36.8 C) 98.7 F (37.1 C)  TempSrc:   Oral Oral  SpO2: 100% 100%    Weight:      Height:        CBC:  Recent Labs  Lab 10/03/19 0804 10/03/19 0812  WBC 7.6  --   NEUTROABS 5.0  --   HGB 9.7* 10.9*  HCT 33.6* 32.0*  MCV 99.7  --    PLT 94*  --     Basic Metabolic Panel:  Recent Labs  Lab 10/03/19 0804 10/03/19 0812  NA 135 131*  K 4.6 4.3  CL 93* 95*  CO2 23  --   GLUCOSE 68* 64*  BUN 38* 38*  CREATININE 7.59* 8.10*  CALCIUM 9.0  --     Lipid Panel:     Component Value Date/Time   CHOL 142 10/03/2019 0804   TRIG 72 10/03/2019 0804   HDL 74 10/03/2019 0804   CHOLHDL 1.9 10/03/2019 0804   VLDL 14 10/03/2019 0804   LDLCALC 54 10/03/2019 0804   LDLCALC 70 06/22/2019 0958   HgbA1c:  Lab Results  Component Value Date   HGBA1C 4.9 10/03/2019   Urine Drug Screen: No results found for: LABOPIA, COCAINSCRNUR, LABBENZ, AMPHETMU, THCU, LABBARB  Alcohol Level No results found for: [place]  CT HEAD WO CONTRAST 10/03/2019 IMPRESSION:  Right putamen/external capsule intraparenchymal hematoma measuring 3.5 cm is unchanged. New intraventricular extension of hemorrhage involving the right lateral ventricle. Subarachnoid hemorrhage overlying the left insula is more conspicuous than prior exam.  CT HEAD CODE STROKE WO CONTRAST 10/03/2019 IMPRESSION:  1. 15 cc acute hematoma at the right putamen and adjacent white matter. Small volume subarachnoid extension seen locally.  2. Small remote left frontal cortex insult, usually infarct. Small remote left  cerebellar infarct.  Chest Port 1 View 10/03/2019 IMPRESSION:  1. No acute cardiopulmonary disease.  2. Mild left basilar atelectasis.  3. Mild cardiomegaly.  4. Similar position of the left-sided central venous catheter.   ECG - SR rate 76 BPM. (See cardiology reading for complete details)  PHYSICAL EXAM Blood pressure 111/64, pulse 68, temperature 98.7 F (37.1 C), temperature source Oral, resp. rate 12, height 5\' 11"  (1.803 m), weight 71.7 kg, SpO2 100 %. Pleasant middle-age African-American male not in distress.  He has bilateral below-knee amputations.  . Afebrile. Head is nontraumatic. Neck is supple without bruit.    Cardiac exam no murmur or  gallop. Lungs are clear to auscultation. Distal pulses are well felt. Neurological Exam :  He is awake alert oriented to time place and person.  Extraocular movements are full range on nystagmus.  Mild dysarthria no aphasia.  Left lower facial weakness.  Tongue midline.  Motor system exam shows mild left upper and lower extremity drift with 4/5 strength in the left upper extremity proximally and distally.  And 4+/5 strength in the left lower extremity at the thighs.  Cannot test distally in lower extremities due to bilateral amputation.  Sensation appears preserved bilaterally.  Plantars are downgoing.  Gait not tested. ASSESSMENT/PLAN Jorge [name] is a 49 y.o. male with history of diabetes with gastroparesis, decreased vision left eye, hypertension, BKA, ESRD on peritoneal dialysis, presented to the emergency room with sudden onset of left-sided weakness, confusion, slurred speech and headache. Blood sugars in the 95A, systolic blood pressure 213Y. Stat CT c/w 15 cc hematoma in the right putamen and adjacent white matter with a small volume subarachnoid extension locally. Small remote left frontal cortex insult and small remote left cerebellar infarct. He did not receive IV t-PA due to [place].  Stroke: 15 cc hematoma in the right putamen and adjacent white matter with a small volume subarachnoid extension locally.  Resultant mild left hemiparesis  Code Stroke CT Head - 15 cc acute hematoma at the right putamen and adjacent white matter. Small volume subarachnoid extension seen locally. Small remote left frontal cortex insult, usually infarct. Small remote left cerebellar infarct.    CT head - right putamen/external capsule intraparenchymal hematoma measuring 3.5 cm is unchanged. New intraventricular extension of hemorrhage involving the right lateral ventricle. Subarachnoid hemorrhage overlying the left insula is more conspicuous than prior exam.  MRI head - not ordered  MRA head - not  ordered  CTA H&N - not ordered  CT Perfusion - not ordered  Carotid Doppler - not ordered  2D Echo - not ordered  [employer] Virus 2  - negative  LDL - 54  HgbA1c - 4.9  UDS - not ordered  VTE prophylaxis - SCDs Diet  Diet Order            Diet renal/carb modified with fluid restriction Diet-HS Snack? Nothing; Fluid restriction: 1200 mL Fluid; Room service appropriate? Yes; Fluid consistency: Thin  Diet effective now                 aspirin 81 mg daily prior to admission, now on No antithrombotic  Ongoing aggressive stroke risk factor management  Therapy recommendations:  pending  Disposition:  Pending  Hypertension  Home BP meds: amlodipine ; Coreg ; catapres  Current BP meds: prn Labetalol and Cleviprex   Mildly hypotensive . Systolic blood pressure goal < 140 mm Hg initially . Long-term BP goal normotensive  Hyperlipidemia  Home Lipid lowering  medication: Lipitor 20 mg daily  LDL 54, goal < 70  Current lipid lowering medication:  None (statin contraindicated with ICH)  Continue statin at discharge  Diabetes  Home diabetic meds: insulin  Current diabetic meds: SSI   HgbA1c 4.9, goal < 7.0 Recent Labs    10/03/19 1156 10/03/19 1557 10/03/19 2310  GLUCAP 132* 97 188*    Other Stroke Risk Factors  Former cigarette smoker - quit  Previous ETOH use  Family hx stroke (brother)   Hx stroke/TIA by imaging  Previous substance abuse - cocaine and marijuana   Other Active Problems Code status - Full code ESRD - creatinine - 8.10 Anemia of chronic disease  - Hgb - 9.7->10.9  Thrombocytopenia - platelets - 94  Hyponatremia - Na - [facility] day # 1 He presented with hypertensive right basal ganglia hemorrhage and remains at risk for neurological worsening and hematoma expansion.  Recommend close neurological monitoring and strict blood pressure control with systolic goal below 286 for 24 hours and then below 160 thereafter.  Check  MRI scan if patient is willing to tolerate otherwise check CT angiogram of brain and neck later.  Mobilize out of bed.  Therapy consults.  Resume home medications.  Avoid aspirin. This patient is critically ill and at significant risk of neurological worsening, death and care requires constant monitoring of vital signs, hemodynamics,respiratory and cardiac monitoring, extensive review of multiple databases, frequent neurological assessment, discussion with family, other specialists and medical decision making of high complexity.I have made any additions or clarifications directly to the above note.This critical care time does not reflect procedure time, or teaching time or supervisory time of PA/NP/Med Resident etc but could involve care discussion time.  May transfer to the floor after 24 hours  I spent 30 minutes of neurocritical care time  in the care of  this patient.    Antony Contras, MD  To contact Stroke Continuity provider, please refer to [email or web address] After hours, contact General Neurology

## 2019-10-04 NOTE — Progress Notes (Signed)
Inpatient Rehab Admissions Coordinator Note:   Per PT recommendations, pt was screened for CIR candidacy by Gayland Curry, MS, CCC-SLP.  At this time we are not recommending an Inpatient Rehab consult.  Will continue to monitor pt's progress with therapies and medical workup.  Please contact me with questions.    Gayland Curry, [place], [place] Admissions Coordinator [phone] 10/04/19 4:04 PM

## 2019-10-04 NOTE — Progress Notes (Signed)
Echocardiogram 2D Echocardiogram has been performed.  [place] Deputy Gianluca Chhim 10/04/2019, 10:05 AM
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
## 2019-10-04 NOTE — Progress Notes (Signed)
Report given to 2W. Patient transferred with all belongings.  Patient made family aware of bed status change with new room number.

## 2019-10-04 NOTE — Evaluation (Signed)
Physical Therapy Evaluation Patient Details Name: Jorge [name] MRN: [id number] DOB: [birth year] Today's Date: 10/04/2019   History of Present Illness  49 y.o. male past medical history of diabetes with gastroparesis, hypertension, BKA, ESRD on peritoneal dialysis, with no prior medical history of strokes, presented to the emergency room for sudden onset of left-sided weakness and confusion. Stat CT performed, consistent with 15 cc hematoma in the right putamen and adjacent white matter with a small volume subarachnoid extension locally.  Small remote left frontal cortex insult and small remote left cerebellar infarct.  Clinical Impression  Pt presents to PT with deficits in functional mobility, gait, balance, strength, power, sensation, coordination, and proprioception. Pt with significant deficits in LUE function, pt with impaired ability to don prosthetic equipment at this time and needing physical assistance to maintain dynamic sitting balance. Pt defers attempts at transfers at this time but PT anticipates the pt may require physical assistance to safely transfer due to LLE weakness and sensation deficits. PT will further assess functional mobility at next session. Due to pt's impaired ability to manage prostheses, impaired sitting balance, and L sided weakness PT recommends the pt discharge to CIR to aide in a return to independent mobility.    Follow Up Recommendations CIR    Equipment Recommendations   (TBD)    Recommendations for Other Services Rehab consult     Precautions / Restrictions Precautions Precautions: Fall Precaution Comments: bilateral BKA Restrictions Weight Bearing Restrictions: No      Mobility  Bed Mobility Overal bed mobility: Needs Assistance Bed Mobility: Supine to Sit;Sit to Supine     Supine to sit: Supervision Sit to supine: Supervision   General bed mobility comments: use of bed rails  Transfers                 General transfer comment:  deferred 2/2 ECHO arrival  Ambulation/Gait                Stairs            Wheelchair Mobility    Modified Rankin (Stroke Patients Only) Modified Rankin (Stroke Patients Only) Pre-Morbid Rankin Score: Moderate disability Modified Rankin: Severe disability     Balance Overall balance assessment: Needs assistance Sitting-balance support: No upper extremity supported;Feet supported Sitting balance-Leahy Scale: Poor Sitting balance - Comments: minA without UE support for dynamic tasks Postural control: Posterior lean                                   Pertinent Vitals/Pain Pain Assessment: No/denies pain    Home Living Family/patient expects to be discharged to:: Private residence Living Arrangements: Other relatives (sister) Available Help at Discharge: Family;Available PRN/intermittently Type of Home: House Home Access: Stairs to enter Entrance Stairs-Rails: None Entrance Stairs-Number of Steps: 1 Home Layout: Two level;Able to live on main level with bedroom/bathroom (lives in basement) Home Equipment: Gilford Rile - 2 wheels;Other (comment) (bilateral prosthetics)      Prior Function Level of Independence: Independent with assistive device(s)         Comments: pt reports ambulating limited community distances with use of prosthetics and RW     Hand Dominance   Dominant Hand: Left    Extremity/Trunk Assessment   Upper Extremity Assessment Upper Extremity Assessment: LUE deficits/detail LUE Deficits / Details: ROM WFL, shoulder and elbow strength 4/5, grip 4-/5 LUE Sensation: decreased light touch;decreased proprioception    Lower Extremity Assessment  Lower Extremity Assessment: LLE deficits/detail LLE Deficits / Details: LLE grossly 4+/5 LLE Sensation: decreased light touch       Communication   Communication:  (dysarthric)  Cognition Arousal/Alertness: Awake/alert;Lethargic (becomes alert with mobility and stimulation) Behavior  During Therapy: Flat affect Overall Cognitive Status: Within Functional Limits for tasks assessed                                        General Comments General comments (skin integrity, edema, etc.): pt reports vision deficits in L eye, further assessment deferred to next session    Exercises     Assessment/Plan    PT Assessment Patient needs continued PT services  PT Problem List Decreased strength;Decreased activity tolerance;Decreased balance;Decreased mobility;Decreased coordination;Impaired sensation       PT Treatment Interventions DME instruction;Gait training;Stair training;Functional mobility training;Therapeutic activities;Therapeutic exercise;Balance training;Neuromuscular re-education;Wheelchair mobility training;Patient/family education    PT Goals (Current goals can be found in the Care Plan section)  Acute Rehab PT Goals Patient Stated Goal: To return to independence PT Goal Formulation: With patient Time For Goal Achievement: 10/18/19 Potential to Achieve Goals: Good    Frequency Min 4X/week   Barriers to discharge        Co-evaluation               AM-PAC PT "6 Clicks" Mobility  Outcome Measure Help needed turning from your back to your side while in a flat bed without using bedrails?: None Help needed moving from lying on your back to sitting on the side of a flat bed without using bedrails?: None Help needed moving to and from a bed to a chair (including a wheelchair)?: Total Help needed standing up from a chair using your arms (e.g., wheelchair or bedside chair)?: Total Help needed to walk in hospital room?: Total Help needed climbing 3-5 steps with a railing? : Total 6 Click Score: 12    End of Session   Activity Tolerance: Patient tolerated treatment well Patient left: in bed;with call bell/phone within reach;with bed alarm set Nurse Communication: Mobility status PT Visit Diagnosis: Other abnormalities of gait and  mobility (R26.89);Muscle weakness (generalized) (M62.81);Other symptoms and signs involving the nervous system (R29.898)    Time: 3291-9166 PT Time Calculation (min) (ACUTE ONLY): 27 min   Charges:   PT Evaluation $PT Eval Moderate Complexity: 1 Mod          Zenaida Niece, PT, DPT Acute Rehabilitation Pager: [phone]   Zenaida Niece 10/04/2019, 10:00 AM

## 2019-10-05 ENCOUNTER — Encounter: Payer: Medicaid Other | Admitting: Rehabilitation

## 2019-10-05 ENCOUNTER — Inpatient Hospital Stay (HOSPITAL_COMMUNITY): Admission: RE | Admit: 2019-10-05 | Payer: Medicare Other | Source: Ambulatory Visit

## 2019-10-05 ENCOUNTER — Encounter: Payer: Self-pay | Admitting: Nurse Practitioner

## 2019-10-05 DIAGNOSIS — I618 Other nontraumatic intracerebral hemorrhage: Secondary | ICD-10-CM

## 2019-10-05 DIAGNOSIS — I610 Nontraumatic intracerebral hemorrhage in hemisphere, subcortical: Secondary | ICD-10-CM | POA: Diagnosis not present

## 2019-10-05 LAB — RENAL FUNCTION PANEL
Albumin: 2.7 g/dL — ABNORMAL LOW (ref 3.5–5.0)
Anion gap: 10 (ref 5–15)
BUN: 17 mg/dL (ref 6–20)
CO2: 27 mmol/L (ref 22–32)
Calcium: 7.9 mg/dL — ABNORMAL LOW (ref 8.9–10.3)
Chloride: 97 mmol/L — ABNORMAL LOW (ref 98–111)
Creatinine, Ser: 5.12 mg/dL — ABNORMAL HIGH (ref 0.61–1.24)
GFR calc Af Amer: 14 mL/min — ABNORMAL LOW (ref >60)
GFR calc non Af Amer: 12 mL/min — ABNORMAL LOW (ref >60)
Glucose, Bld: 156 mg/dL — ABNORMAL HIGH (ref 70–99)
Phosphorus: 2.8 mg/dL (ref 2.5–4.6)
Potassium: 3.5 mmol/L (ref 3.5–5.1)
Sodium: 134 mmol/L — ABNORMAL LOW (ref 135–145)

## 2019-10-05 LAB — CBC
HCT: 27.7 % — ABNORMAL LOW (ref 39.0–52.0)
Hemoglobin: 8.7 g/dL — ABNORMAL LOW (ref 13.0–17.0)
MCH: 30 pg (ref 26.0–34.0)
MCHC: 31.4 g/dL (ref 30.0–36.0)
MCV: 95.5 fL (ref 80.0–100.0)
Platelets: 104 10*3/uL — ABNORMAL LOW (ref 150–400)
RBC: 2.9 MIL/uL — ABNORMAL LOW (ref 4.22–5.81)
RDW: 16.2 % — ABNORMAL HIGH (ref 11.5–15.5)
WBC: 7.4 10*3/uL (ref 4.0–10.5)
nRBC: 0 % (ref 0.0–0.2)

## 2019-10-05 LAB — GLUCOSE, CAPILLARY
Glucose-Capillary: 112 mg/dL — ABNORMAL HIGH (ref 70–99)
Glucose-Capillary: 135 mg/dL — ABNORMAL HIGH (ref 70–99)
Glucose-Capillary: 139 mg/dL — ABNORMAL HIGH (ref 70–99)
Glucose-Capillary: 242 mg/dL — ABNORMAL HIGH (ref 70–99)
Glucose-Capillary: 309 mg/dL — ABNORMAL HIGH (ref 70–99)
Glucose-Capillary: 65 mg/dL — ABNORMAL LOW (ref 70–99)

## 2019-10-05 MED ORDER — GABAPENTIN 300 MG PO CAPS
300.0000 mg | ORAL_CAPSULE | Freq: Every day | ORAL | Status: DC
  Administered 2019-10-06 – 2019-10-16 (×12): 300 mg via ORAL
  Filled 2019-10-05 (×12): qty 1

## 2019-10-05 MED ORDER — CHLORHEXIDINE GLUCONATE CLOTH 2 % EX PADS
6.0000 | MEDICATED_PAD | Freq: Every day | CUTANEOUS | Status: DC
  Administered 2019-10-06 – 2019-10-07 (×2): 6 via TOPICAL

## 2019-10-05 NOTE — Consult Note (Signed)
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 atrial pressure of 3 mmHg, the estimated right ventricular systolic pressure is 14.4 mmHg. Left Atrium: Left atrial size was normal in size. Right Atrium: Right atrial size was normal in size. Pericardium: There is no evidence of pericardial effusion. Mitral Valve: The mitral valve is normal in structure. Trivial mitral valve regurgitation. No evidence of mitral valve stenosis. Tricuspid Valve: The tricuspid valve is normal in structure. Tricuspid valve regurgitation is not demonstrated. No evidence of tricuspid stenosis. Aortic Valve: The aortic valve is normal in structure. Aortic valve regurgitation is not visualized. No aortic stenosis is present. Pulmonic Valve: The pulmonic valve was normal in structure. Pulmonic valve regurgitation is not visualized. No evidence of pulmonic stenosis. Aorta: The aortic root is normal in size and structure. Venous: The inferior vena cava is normal in size with greater than 50% respiratory variability, suggesting right atrial pressure of 3 mmHg. IAS/Shunts: No atrial level shunt detected by color flow Doppler.  LEFT VENTRICLE PLAX 2D LVIDd:         4.80 cm  Diastology LVIDs:         3.10 cm  LV e' medial:    5.98 cm/s LV PW:          1.40 cm  LV E/e' medial:  8.6 LV IVS:        1.40 cm  LV e' lateral:   11.40 cm/s LVOT diam:     2.30 cm  LV E/e' lateral: 4.5 LV SV:         68 LV SV  Index:   36 LVOT Area:     4.15 cm  RIGHT VENTRICLE RV S prime:     11.10 cm/s TAPSE (M-mode): 1.9 cm LEFT ATRIUM             Index       RIGHT ATRIUM           Index LA diam:        4.00 cm 2.10 cm/m  RA Area:     15.40 cm LA Vol (A2C):   57.0 ml 29.88 ml/m RA Volume:   36.20 ml  18.97 ml/m LA Vol (A4C):   51.6 ml 27.05 ml/m LA Biplane Vol: 59.3 ml 31.08 ml/m  AORTIC VALVE LVOT Vmax:   75.10 cm/s LVOT Vmean:  48.400 cm/s LVOT VTI:    0.164 m  AORTA Ao Root diam: 3.50 cm MITRAL VALVE               TRICUSPID VALVE MV Area (PHT): 2.34 cm    TR Peak grad:   20.8 mmHg MV Decel Time: 324 msec    TR Vmax:        228.00 cm/s MV E velocity: 51.40 cm/s MV A velocity: 60.80 cm/s  SHUNTS MV E/A ratio:  0.85        Systemic VTI:  0.16 m                            Systemic Diam: 2.30 cm Dani Gobble Croitoru MD Electronically signed by Sanda Klein MD Signature Date/Time: 10/04/2019/12:20:40 PM    Final      Assessment/Plan: Diagnosis: Right putaminal hemmorhage with decline in ADLs and mobility  1. Does the need for close, 24 hr/day medical supervision in concert with the patient's rehab needs make it unreasonable for this patient to be served in a less intensive setting? Yes 2. Co-Morbidities requiring supervision/potential complications: ESRD on PD, B BKA wearing prosthetics 3. Due to bowel management, safety, skin/wound care, disease management, medication administration, pain management and patient education, does the patient require 24 hr/day rehab nursing? Yes 4. Does the patient require coordinated care of a physician, rehab nurse, therapy disciplines of PT, OT<,SLP to address physical and functional deficits in the context of the above medical diagnosis(es)? Yes Addressing deficits in the following areas: balance, endurance, locomotion, strength,  transferring, bowel/bladder control, bathing, dressing, toileting, cognition, swallowing and psychosocial support 5. Can the patient actively participate in an intensive therapy program of at least 3 hrs of therapy per day at least 5 days per week? Yes 6. The potential for patient to make measurable gains while on inpatient rehab is good 7. Anticipated functional outcomes upon discharge from inpatient rehab are supervision  with PT, supervision with OT, supervision with SLP. 8. Estimated rehab length of stay to reach the above functional goals is: 14-18d 9. Anticipated discharge destination: Home 10. Overall Rehab/Functional Prognosis: good  RECOMMENDATIONS: This patient's condition is appropriate for continued rehabilitative care in the following setting: CIR Patient has agreed to participate in recommended program. Yes Note that insurance prior authorization may be required for reimbursement for recommended care.  Comment:   Cathlyn Parsons, PA-C 10/05/2019  "I have personally performed a face to face diagnostic evaluation of this patient.  Additionally, I have reviewed and concur with the physician assistant's documentation above." Charlett Blake M.D. [facility] FAAPM&R (Neuromuscular Med) Diplomate Am Board of Electrodiagnostic Med Fellow Am Board of Interventional Pain

## 2019-10-05 NOTE — Progress Notes (Signed)
Physical Therapy Treatment Patient Details Name: Jorge [name] MRN: [id number] DOB: October 06, 1970 Today's Date: 10/05/2019    History of Present Illness 49 y.o. 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 Stat CT performed, consistent with 15 cc hematoma in the right putamen and adjacent white matter with a small volume subarachnoid extension locally.  Small remote left frontal cortex insult and small remote left cerebellar infarct.    PT Comments    Pt very difficult to wake initially, but once awake pt engaging and interactive. PT initiated gait training with pt this session, pt with L visual deficits and possible inattention requiring max multimodal cuing to safely navigate room. Pt also requiring mod +2 for weight shifting, forward progression LEs L>R, and steadying. Pt with decreased safety awareness and impulsivity during session, but follows verbal and tactile commands well. Pt demonstrating difficulty donning prostheses, even with PT and OT assist at EOB. At baseline, pt reports independence with this. PT continuing to recommend CIR post-acutely.     Follow Up Recommendations  CIR     Equipment Recommendations   (TBD)    Recommendations for Other Services Rehab consult     Precautions / Restrictions Precautions Precautions: Fall Precaution Comments: bilateral BKA Restrictions Weight Bearing Restrictions: No    Mobility  Bed Mobility Overal bed mobility: Needs Assistance Bed Mobility: Supine to Sit     Supine to sit: Min assist;+2 for safety/equipment;HOB elevated     General bed mobility comments: min assist for scooting to EOB, righting balance when scooting. Pt losing balance posteriorly and to the L.  Transfers Overall transfer level: Needs  assistance Equipment used: Rolling walker (2 wheeled) Transfers: Sit to/from Stand Sit to Stand: Mod assist;+2 safety/equipment;From elevated surface         General transfer comment: Mod +2 for power up, steadying, ensuring bilateral prostheses donned and locked in, and slow eccentric lowering when moving stand>sit.  Ambulation/Gait Ambulation/Gait assistance: Mod assist;+2 physical assistance Gait Distance (Feet): 20 Feet Assistive device: Rolling walker (2 wheeled) Gait Pattern/deviations: Step-through pattern;Decreased stride length;Trunk flexed;Drifts right/left Gait velocity: decr   General Gait Details: Mod +2 for steadying, physically weight shifting pt L and R to offweight LEs for swing phase of gait, holding RW as pt tending to place RW way too far in front of self. Pt bumping into objects on L and in front of him x2 (door and trashcan).   Stairs             Wheelchair Mobility    Modified Rankin (Stroke Patients Only) Modified Rankin (Stroke Patients Only) Pre-Morbid Rankin Score: Moderate disability Modified Rankin: Moderately severe disability     Balance Overall balance assessment: Needs assistance Sitting-balance support: No upper extremity supported;Feet supported Sitting balance-Leahy Scale: Poor Sitting balance - Comments: intermittent assist to correct posterior and L lateral leaning when donning prostheses Postural control: Posterior lean;Left lateral lean Standing balance support: Bilateral upper extremity supported;During functional activity Standing balance-Leahy Scale: Poor Standing balance comment: reliant on external device and PT/OT steadying assist                            Cognition Arousal/Alertness: Awake/alert;Lethargic (becomes alert with mobility and stimulation) Behavior During Therapy: Flat affect;Impulsive Overall Cognitive Status: Impaired/Different from baseline Area of Impairment:  Safety/judgement;Awareness;Problem solving;Following commands;Attention                   Current Attention Level: Selective   Following Commands: Follows one step commands with increased time Safety/Judgement: Decreased awareness of deficits;Decreased awareness of safety Awareness: Emergent Problem Solving: Difficulty sequencing;Requires verbal cues;Requires tactile cues General Comments: Pt sleeping and difficult to wake initially, woke and was engaging during session. Pt lacking insight into deficits and decreased awareness during mobility, requiring max multimodal cuing during transfer/gait to safely mobilize. L inattentive, with L visual field cut when assessed by OT.      Exercises      General Comments General comments (skin integrity, edema, etc.): L visual field cut      Pertinent Vitals/Pain Pain Assessment: Faces Faces Pain Scale: No hurt Pain Intervention(s): Monitored during session    Home Living Family/patient expects to be discharged to:: Private residence Living Arrangements: Other relatives (sister) Available Help at Discharge: Family;Available PRN/intermittently Type of Home: House Home Access: Stairs to enter Entrance Stairs-Rails: None Home Layout: Two level;Able to live on main level with bedroom/bathroom Home Equipment: Gilford Rile - 2 wheels;Other (comment) Additional Comments: bil prosthetics    Prior Function Level of Independence: Independent with assistive device(s)      Comments: pt reports ambulating limited community distances with use of prosthetics and RW   PT Goals (current goals can now be found in the care plan section) Acute Rehab PT Goals Patient Stated Goal: To return to independence PT Goal Formulation: With patient Time For Goal Achievement: 10/18/19 Potential to Achieve Goals: Good Progress towards PT goals: Progressing toward goals    Frequency    Min 4X/week      PT Plan Current plan remains appropriate     Co-evaluation PT/OT/SLP Co-Evaluation/Treatment: Yes Reason for Co-Treatment: Complexity of the patient's impairments (multi-system involvement);For patient/therapist safety;To address functional/ADL transfers PT goals addressed during session: Mobility/safety with mobility;Balance;Proper use of DME        AM-PAC PT "6 Clicks" Mobility   Outcome Measure  Help needed turning from your back to your side while in a flat bed without using bedrails?: None Help needed moving from lying on your back to sitting on the side of a flat bed without using bedrails?: A Little Help needed moving to and from a bed to a chair (including a wheelchair)?: A Lot Help needed standing up from a chair using your arms (e.g., wheelchair or bedside chair)?: A Lot Help needed to walk in hospital room?: A Lot Help needed climbing 3-5 steps with a railing? : Total 6 Click Score: 14    End of Session   Activity Tolerance: Patient tolerated treatment well Patient left: with call bell/phone within reach;in chair;with chair alarm set;with nursing/sitter in room Nurse Communication: Mobility status PT Visit Diagnosis: Other abnormalities of gait and mobility (R26.89);Muscle weakness (generalized) (M62.81);Other symptoms and signs involving the nervous system (R29.898)     Time: 6203-5597 PT Time Calculation (min) (ACUTE ONLY): 35 min  Charges:  $Gait Training: 8-22 mins                     Nicollette Wilhelmi E, PT Acute Rehabilitation Services Pager [phone]  Office [phone]    Michaiah Maiden D Ellen Mayol 10/05/2019, 12:00 PM

## 2019-10-05 NOTE — Progress Notes (Signed)
Inpatient Rehab Admissions Coordinator:  Pt screened again.  Per PT note, pt appears appropriate for an Inpatient Rehab consult.  AC will place consult order per protocol.    Gayland Curry, [place], [place] Admissions Coordinator [phone]

## 2019-10-05 NOTE — Plan of Care (Signed)
  Problem: Education: Goal: Knowledge of disease or condition will improve Outcome: Progressing   Problem: Nutrition: Goal: Risk of aspiration will decrease Outcome: Progressing   Problem: Nutrition: Goal: Dietary intake will improve Outcome: Progressing

## 2019-10-05 NOTE — Progress Notes (Signed)
   10/05/19 1636  Clinical Encounter Type  Visited With Patient  Visit Type Initial  Referral From Other (Comment) (Rounding on floor)  Consult/Referral To Chaplain  Rounding on floor, stopped to visit patient who requested crackers. Patient appreciative of conversation,spiritual support and offered prayer.This note was prepared by Jeanine Luz, M.Div..  For questions please contact by phone [phone].

## 2019-10-05 NOTE — Progress Notes (Signed)
Inpatient Rehab Admissions Coordinator:   Met with patient at bedside to discuss potential CIR admission. Pt. Stated interest. Will pursue for potential admit next week, pending bed availability. Will reach out to family to confirm support at discharge.   Clemens Catholic, [place], [place] Admissions Coordinator  [phone] ([place]) [phone] (office)

## 2019-10-05 NOTE — Evaluation (Signed)
Occupational Therapy Evaluation Patient Details Name: Jorge [name] MRN: [id number] DOB: 04-21-1970 Today's Date: 10/05/2019    History of Present Illness 49 y.o. 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 Stat CT performed, consistent with 15 cc hematoma in the right putamen and adjacent white matter with a small volume subarachnoid extension locally.  Small remote left frontal cortex insult and small remote left cerebellar infarct.   Clinical Impression   PTA pt living with family and functioning at mod I level with RW and bil LE prostheses. At time of eval, pt presents with ability to complete bed mobility at min A +2 and sit <> stands at mod A +2. Pt demonstrates decreased safety, awareness, impulsivity, difficulty following commands, and poor problem solving with tasks performed this session. He was able to complete functional mobility with mod A +2 and RW to door and back with max cues for navigation and to manage impulsivity. Noted L sided visual field cut and mild inattention with further assessment. Pt with difficulty scanning when reading when assessed by OT. He also was noted to have his L hand lying in his food and unaware until cued. He also required max A to don bil LE prostheses. Given current status, recommend CIR to support safety, BADL engagement, and independent PLOF. OT will continue to follow per POC listed below.    Follow Up Recommendations  CIR;Supervision/Assistance - 24 hour    Equipment Recommendations  None recommended by OT    Recommendations for Other Services Rehab consult     Precautions / Restrictions Precautions Precautions: Fall Precaution Comments: bilateral BKA Restrictions Weight Bearing Restrictions: No      Mobility Bed  Mobility Overal bed mobility: Needs Assistance Bed Mobility: Supine to Sit     Supine to sit: Min assist;+2 for safety/equipment;HOB elevated     General bed mobility comments: min assist for scooting to EOB, righting balance when scooting. Pt losing balance posteriorly and to the L.  Transfers Overall transfer level: Needs assistance Equipment used: Rolling walker (2 wheeled) Transfers: Sit to/from Stand Sit to Stand: Mod assist;+2 safety/equipment;From elevated surface         General transfer comment: Mod +2 for power up, steadying, ensuring bilateral prostheses donned and locked in, and slow eccentric lowering when moving stand>sit.    Balance Overall balance assessment: Needs assistance Sitting-balance support: No upper extremity supported;Feet supported Sitting balance-Leahy Scale: Poor Sitting balance - Comments: intermittent assist to correct posterior and L lateral leaning when donning prostheses Postural control: Posterior lean;Left lateral lean Standing balance support: Bilateral upper extremity supported;During functional activity Standing balance-Leahy Scale: Poor Standing balance comment: reliant on external device and PT/OT steadying assist                           ADL either performed or assessed with clinical judgement   ADL Overall ADL's : Needs assistance/impaired Eating/Feeding: Minimal assistance;Sitting;Cueing for safety Eating/Feeding Details (indicate cue type and reason): assist to manage L side (pt neglecting and with field cut). Pt also resting L hand in eggs and unaware Grooming: Minimal assistance;Sitting   Upper Body Bathing: Moderate assistance;Sitting   Lower Body Bathing: Moderate assistance;Sit to/from stand;Sitting/lateral leans   Upper Body Dressing : Moderate assistance;Sitting   Lower Body Dressing: Sitting/lateral leans;Sit to/from stand;Maximal  assistance;Cueing for safety Lower Body Dressing Details (indicate cue type  and reason): to don bil prosthetics Toilet Transfer: Moderate assistance;+2 for physical assistance;+2 for safety/equipment;RW;Stand-pivot Toilet Transfer Details (indicate cue type and reason): for navigation of obstacles and managing L environment safely Toileting- Clothing Manipulation and Hygiene: Maximal assistance;Sitting/lateral lean;Sit to/from stand       Functional mobility during ADLs: Moderate assistance;+2 for physical assistance;+2 for safety/equipment;Cueing for safety;Cueing for sequencing;Rolling walker General ADL Comments: pt with L sided visual deficits and inattention     Vision Baseline Vision/History: No visual deficits Patient Visual Report: Peripheral vision impairment Vision Assessment?: Yes Tracking/Visual Pursuits: Impaired - to be further tested in functional context;Requires cues, head turns, or add eye shifts to track;Decreased smoothness of eye movement to LEFT superior field;Decreased smoothness of eye movement to LEFT inferior field Visual Fields: Left visual field deficit;Impaired-to be further tested in functional context Additional Comments: noted L visual field cut, pt reports it being "cut off in his left side". Also note mild L inattention     Perception     Praxis      Pertinent Vitals/Pain Pain Assessment: Faces Faces Pain Scale: No hurt Pain Intervention(s): Monitored during session     Hand Dominance     Extremity/Trunk Assessment Upper Extremity Assessment Upper Extremity Assessment: LUE deficits/detail LUE Deficits / Details: grossly 4-/5. Will use LUE in functional activity, but cannot sustain activity like pulling up sock, scooting with LUE to EOB. Noted some inattention (pt leaving L hand lying in eggs) LUE Sensation: decreased light touch;decreased proprioception   Lower Extremity Assessment Lower Extremity Assessment: Defer to PT evaluation       Communication     Cognition Arousal/Alertness: Awake/alert;Lethargic  (initially lethargic, difficult to wake until began mobility) Behavior During Therapy: Flat affect;Impulsive Overall Cognitive Status: Impaired/Different from baseline Area of Impairment: Safety/judgement;Awareness;Problem solving;Following commands;Attention                   Current Attention Level: Selective;Sustained   Following Commands: Follows one step commands with increased time Safety/Judgement: Decreased awareness of deficits;Decreased awareness of safety Awareness: Emergent Problem Solving: Difficulty sequencing;Requires verbal cues;Requires tactile cues General Comments: pt initially difficult to wake, requiring initiation of movement to participate. Noted decreased insights into deficits with poor awareness of safety with mobility. Max multimodal cueing required throughout for safe navigation   General Comments      Exercises     Shoulder Instructions      Home Living                                          Prior Functioning/Environment                   OT Problem List: Decreased strength;Decreased knowledge of use of DME or AE;Impaired vision/perception;Decreased knowledge of precautions;Decreased activity tolerance;Decreased cognition;Impaired UE functional use;Impaired balance (sitting and/or standing);Decreased safety awareness      OT Treatment/Interventions: Self-care/ADL training;Visual/perceptual remediation/compensation;Therapeutic exercise;Patient/family education;Neuromuscular education;Balance training;Energy conservation;Therapeutic activities;DME and/or AE instruction;Cognitive remediation/compensation    OT Goals(Current goals can be found in the care plan section) Acute Rehab OT Goals Patient Stated Goal: To return to independence OT Goal Formulation: With patient Time For Goal Achievement: 10/19/19 Potential to Achieve Goals: Good  OT Frequency: Min 2X/week   Barriers to D/C:            Co-evaluation  PT/OT/SLP Co-Evaluation/Treatment: Yes  Reason for Co-Treatment: For patient/therapist safety;To address functional/ADL transfers;Necessary to address cognition/behavior during functional activity PT goals addressed during session: Mobility/safety with mobility;Balance;Proper use of DME OT goals addressed during session: ADL's and self-care;Proper use of Adaptive equipment and DME;Strengthening/ROM;Other (comment) (vision)      AM-PAC OT "6 Clicks" Daily Activity     Outcome Measure Help from another person eating meals?: A Little Help from another person taking care of personal grooming?: A Little Help from another person toileting, which includes using toliet, bedpan, or urinal?: A Lot Help from another person bathing (including washing, rinsing, drying)?: A Lot Help from another person to put on and taking off regular upper body clothing?: A Lot Help from another person to put on and taking off regular lower body clothing?: A Lot 6 Click Score: 14   End of Session Equipment Utilized During Treatment: Rolling walker;Gait belt Nurse Communication: Mobility status  Activity Tolerance: Patient tolerated treatment well Patient left: in chair;with call bell/phone within reach;with chair alarm set  OT Visit Diagnosis: Unsteadiness on feet (R26.81);Other abnormalities of gait and mobility (R26.89);Muscle weakness (generalized) (M62.81);Other symptoms and signs involving the nervous system (R29.898);Other symptoms and signs involving cognitive function;Hemiplegia and hemiparesis Hemiplegia - Right/Left: Left Hemiplegia - dominant/non-dominant: Dominant Hemiplegia - caused by: Nontraumatic intracerebral hemorrhage                Time: 6144-3154 OT Time Calculation (min): 35 min Charges:  OT General Charges $OT Visit: 1 Visit OT Evaluation $OT Eval Moderate Complexity: [place], MSOT, OTR/L Acute Rehabilitation Services [facility] Office Number: [phone] Pager:  [phone]  Zenovia Jarred 10/05/2019, 1:18 PM

## 2019-10-05 NOTE — Progress Notes (Signed)
STROKE TEAM PROGRESS NOTE   INTERVAL HISTORY Patient is sitting up in a bedside chair.  He is sleepy but can be aroused with some difficulty.  He follows commands.  Patient has been felt to be inpatient rehab candidate but patient's family were already thinking about moving him to SNF as they cannot provide 24-hour care at home.  Blood pressure remains slightly elevated.  OBJECTIVE Vitals:   10/04/19 2330 10/04/19 2345 10/05/19 0030 10/05/19 0849  BP: (!) 162/85 (!) 193/87 (!) 157/74 127/61  Pulse: 78 91 81 72  Resp: 13 12 20 16   Temp:  99.1 F (37.3 C) 98.2 F (36.8 C) (!) 97.5 F (36.4 C)  TempSrc:  Oral  Oral  SpO2:  100% 99% 96%  Weight:  70 kg    Height:       CBC:  Recent Labs  Lab 10/03/19 0804 10/03/19 0804 10/03/19 0812 10/05/19 0231  WBC 7.6  --   --  7.4  NEUTROABS 5.0  --   --   --   HGB 9.7*   < > 10.9* 8.7*  HCT 33.6*   < > 32.0* 27.7*  MCV 99.7  --   --  95.5  PLT 94*  --   --  104*   < > = values in this interval not displayed.    Basic Metabolic Panel:  Recent Labs  Lab 10/03/19 0804 10/03/19 0804 10/03/19 0812 10/05/19 0231  NA 135   < > 131* 134*  K 4.6   < > 4.3 3.5  CL 93*   < > 95* 97*  CO2 23  --   --  27  GLUCOSE 68*   < > 64* 156*  BUN 38*   < > 38* 17  CREATININE 7.59*   < > 8.10* 5.12*  CALCIUM 9.0  --   --  7.9*  PHOS  --   --   --  2.8   < > = values in this interval not displayed.    IMAGING past 24h CT ANGIO HEAD W OR WO CONTRAST  Result Date: 10/04/2019 CLINICAL DATA:  CTA head and neck. EXAM: CT ANGIOGRAPHY HEAD AND NECK TECHNIQUE: Multidetector CT imaging of the head and neck was performed using the standard protocol during bolus administration of intravenous contrast. Multiplanar CT image reconstructions and MIPs were obtained to evaluate the vascular anatomy. Carotid stenosis measurements (when applicable) are obtained utilizing NASCET criteria, using the distal internal carotid diameter as the denominator. CONTRAST:  66mL  OMNIPAQUE IOHEXOL 350 MG/ML SOLN COMPARISON:  10/03/2019 head CTs. FINDINGS: CT HEAD FINDINGS Brain: 3.3 x 3.1 cm intraparenchymal hemorrhage within the right putamen and external capsule is slightly less conspicuous than prior exam, previously 3.5 x 3.2 cm. Minimal intraventricular extension involving the right lateral ventricle is unchanged. Left insular subarachnoid hemorrhage is unchanged. Remote left frontal white matter insult. No mass lesion. No midline shift, ventriculomegaly or extra-axial fluid collection. Vascular: Please see CTA. Skull: Negative for fracture or focal lesion. Sinuses/Orbits: Normal orbits. Clear paranasal sinuses. No mastoid effusion. Other: None. Review of the MIP images confirms the above findings CTA NECK FINDINGS Aortic arch: Standard branching. Mild aortic arch atherosclerotic calcifications. Imaged portion shows no evidence of aneurysm or dissection. No significant stenosis of the major arch vessel origins. Right carotid system: No evidence of dissection, stenosis (50% or greater) or occlusion. Proximal CCA atherosclerotic calcifications with 20% luminal narrowing. Carotid bifurcation atherosclerotic calcifications. Left carotid system: No evidence of dissection, stenosis (50% or  greater) or occlusion. Circumferential proximal CCA atherosclerotic calcifications with 30% luminal narrowing. Carotid bifurcation atherosclerotic calcifications with approximately 40% narrowing of the proximal ICA. Vertebral arteries: Codominant. No evidence of dissection, stenosis (50% or greater) or occlusion. Skeleton: No acute or suspicious osseous abnormalities. Other neck: No adenopathy.  No soft tissue mass. Upper chest: Dependent atelectasis. Prominent to mildly enlarged superior mediastinal nodes. Review of the MIP images confirms the above findings CTA HEAD FINDINGS Anterior circulation: Bilateral carotid siphon atherosclerotic calcifications without significant narrowing of the intracavernous  segments. No significant stenosis, proximal occlusion, aneurysm, or vascular malformation. Posterior circulation: Codominant vertebral arteries. Fetal origin of the bilateral PCAs. No significant stenosis, proximal occlusion, aneurysm, or vascular malformation. Venous sinuses: As permitted by contrast timing, patent. Anatomic variants: Detailed above. Review of the MIP images confirms the above findings IMPRESSION: CT head: Right putamen/external capsule intraparenchymal hematoma measuring 3.3 cm, less conspicuous than prior exam. Trace intraventricular hemorrhage involving the right lateral ventricle is unchanged. Left insular subarachnoid hemorrhage, unchanged. CTA head and neck: 40% proximal left ICA narrowing. 20-30% narrowing of the bilateral common carotids. No large vessel occlusion, high-grade narrowing, dissection or aneurysm involving the major intracranial vessels. Electronically Signed   By: Primitivo Gauze M.D.   On: 10/04/2019 17:39   CT ANGIO NECK W OR WO CONTRAST  Result Date: 10/04/2019 CLINICAL DATA:  CTA head and neck. EXAM: CT ANGIOGRAPHY HEAD AND NECK TECHNIQUE: Multidetector CT imaging of the head and neck was performed using the standard protocol during bolus administration of intravenous contrast. Multiplanar CT image reconstructions and MIPs were obtained to evaluate the vascular anatomy. Carotid stenosis measurements (when applicable) are obtained utilizing NASCET criteria, using the distal internal carotid diameter as the denominator. CONTRAST:  20mL OMNIPAQUE IOHEXOL 350 MG/ML SOLN COMPARISON:  10/03/2019 head CTs. FINDINGS: CT HEAD FINDINGS Brain: 3.3 x 3.1 cm intraparenchymal hemorrhage within the right putamen and external capsule is slightly less conspicuous than prior exam, previously 3.5 x 3.2 cm. Minimal intraventricular extension involving the right lateral ventricle is unchanged. Left insular subarachnoid hemorrhage is unchanged. Remote left frontal white matter insult.  No mass lesion. No midline shift, ventriculomegaly or extra-axial fluid collection. Vascular: Please see CTA. Skull: Negative for fracture or focal lesion. Sinuses/Orbits: Normal orbits. Clear paranasal sinuses. No mastoid effusion. Other: None. Review of the MIP images confirms the above findings CTA NECK FINDINGS Aortic arch: Standard branching. Mild aortic arch atherosclerotic calcifications. Imaged portion shows no evidence of aneurysm or dissection. No significant stenosis of the major arch vessel origins. Right carotid system: No evidence of dissection, stenosis (50% or greater) or occlusion. Proximal CCA atherosclerotic calcifications with 20% luminal narrowing. Carotid bifurcation atherosclerotic calcifications. Left carotid system: No evidence of dissection, stenosis (50% or greater) or occlusion. Circumferential proximal CCA atherosclerotic calcifications with 30% luminal narrowing. Carotid bifurcation atherosclerotic calcifications with approximately 40% narrowing of the proximal ICA. Vertebral arteries: Codominant. No evidence of dissection, stenosis (50% or greater) or occlusion. Skeleton: No acute or suspicious osseous abnormalities. Other neck: No adenopathy.  No soft tissue mass. Upper chest: Dependent atelectasis. Prominent to mildly enlarged superior mediastinal nodes. Review of the MIP images confirms the above findings CTA HEAD FINDINGS Anterior circulation: Bilateral carotid siphon atherosclerotic calcifications without significant narrowing of the intracavernous segments. No significant stenosis, proximal occlusion, aneurysm, or vascular malformation. Posterior circulation: Codominant vertebral arteries. Fetal origin of the bilateral PCAs. No significant stenosis, proximal occlusion, aneurysm, or vascular malformation. Venous sinuses: As permitted by contrast timing, patent. Anatomic variants: Detailed above. Review  of the MIP images confirms the above findings IMPRESSION: CT head: Right  putamen/external capsule intraparenchymal hematoma measuring 3.3 cm, less conspicuous than prior exam. Trace intraventricular hemorrhage involving the right lateral ventricle is unchanged. Left insular subarachnoid hemorrhage, unchanged. CTA head and neck: 40% proximal left ICA narrowing. 20-30% narrowing of the bilateral common carotids. No large vessel occlusion, high-grade narrowing, dissection or aneurysm involving the major intracranial vessels. Electronically Signed   By: Primitivo Gauze M.D.   On: 10/04/2019 17:39    PHYSICAL EXAM   Pleasant middle-age African-American male not in distress.  He has bilateral below-knee amputations.  . Afebrile. Head is nontraumatic. Neck is supple without bruit.    Cardiac exam no murmur or gallop. Lungs are clear to auscultation. Distal pulses are well felt. Neurological Exam :  He is is drowsy but can be aroused with some difficulty.  Oriented to time place and person.  Extraocular movements are full range on nystagmus.  Mild dysarthria no aphasia.  Left lower facial weakness.  Tongue midline.  Motor system exam shows mild left upper and lower extremity drift with 4/5 strength in the left upper extremity proximally and distally.  And 4+/5 strength in the left lower extremity at the thighs.  Cannot test distally in lower extremities due to bilateral amputation.  Sensation appears preserved bilaterally.  Plantars are downgoing.  Gait not tested.   ASSESSMENT/PLAN [name] is a 49 y.o. male with history of diabetes with gastroparesis, decreased vision left eye, hypertension, BKA, ESRD on peritoneal dialysis, presented to the emergency room with sudden onset of left-sided weakness, confusion, slurred speech and headache. Blood sugars in the 16X, systolic blood pressure 096E. Stat CT c/w 15 cc hematoma in the right putamen and adjacent white matter with a small volume subarachnoid extension locally. Small remote left frontal cortex insult and small  remote left cerebellar infarct. He did not receive IV t-PA due to [place].  Stroke: R putamen and adjacent white matter ICH w/ IVH & SAH   Resultant mild left hemiparesis  Code Stroke CT Head - 15 cc acute hematoma at the right putamen and adjacent white matter. Small volume subarachnoid extension seen locally. Small remote left frontal cortex insult, usually infarct. Small remote left cerebellar infarct.   CT head - stable R putamen/external capsule IPH measuring 3.5 cm. New IVH R lateral ventricle. SAH L insula more defined  [employer] Virus 2  - negative  LDL - 54  HgbA1c - 4.9  VTE prophylaxis - SCDs  aspirin 81 mg daily prior to admission, now on No antithrombotic given hemorrhage   Therapy recommendations:  CIR  Disposition:  Pending  Hypertension  Home BP meds: amlodipine ; Coreg ; catapres  Current BP meds: amlodipine 10, coreg 12.5 bid, lasix 20, prn Labetalol   . BP up to 190s last pm w/o prns administered . SBP goal < 140 mm Hg initially->now < 160 . Long-term BP goal normotensive  Hyperlipidemia  Home Lipid lowering medication: Lipitor 20 mg daily  LDL 54, goal < 70  Current lipid lowering medication:  None (statin contraindicated with ICH)  Continue statin at discharge  Diabetes  Home diabetic meds: insulin  Current diabetic meds: lantus 10, SSI 0-6  HgbA1c 4.9, goal < 7.0 Recent Labs    10/04/19 1642 10/05/19 0031 10/05/19 0852  GLUCAP 193* 309* 139*    Other Stroke Risk Factors  Former cigarette smoker - quit  Previous ETOH use  Family hx stroke (brother)  Hx stroke/TIA by imaging  Previous substance abuse - cocaine and marijuana   Other Active Problems Code status - Full code ESRD - HD TTS - creatinine - 7.59->8.10->5.12 Anemia of chronic kidney disease  - Hgb - 9.7->10.9->8.7 Thrombocytopenia - platelets - 94->104  Hyponatremia - Na - 377->939->688 Metabolic bone disease  Hospital day # 2 Continue ongoing therapies.   Mobilize out of bed.  Consider transfer to skilled nursing facility as patient does not have family support to qualify to go to inpatient rehab.  Use as needed labetalol to keep systolic blood pressure below 160.  Discussed with patient and RN and answered questions.  Greater than 50% time during this 25-minute visit was spent on counseling and coordination of care and discussion with care team. Antony Contras, MD To contact Stroke Continuity provider, please refer to [email or web address] After hours, contact General Neurology

## 2019-10-05 NOTE — Progress Notes (Signed)
   10/04/19 2345  Hand-Off documentation  Handoff Given Given to shift RN/LPN  Report given to (Full Name)  ke'la I, rn  Handoff Received Received from shift RN/LPN  Report received from (Full Name) Renny Gunnarson  Vital Signs  Temp 99.1 F (37.3 C)  Temp Source Oral  Pulse Rate 91  Pulse Rate Source Monitor  Resp 12  BP (!) 193/87  BP Location Left Arm  BP Method Automatic  Patient Position (if appropriate) Lying  Oxygen Therapy  SpO2 100 %  O2 Device Room Air  Pain Assessment  Pain Scale 0-10  Pain Score 0  Dialysis Weight  Weight 70 kg  Type of Weight Post-Dialysis  Post-Hemodialysis Assessment  Rinseback Volume (mL) 250 mL  KECN 262 V  Dialyzer Clearance Lightly streaked  Duration of HD Treatment -hour(s) 3 hour(s)  Hemodialysis Intake (mL) 500 mL  UF Total -Machine (mL) 1228 mL  Net UF (mL) 728 mL  Tolerated HD Treatment Yes  Post-Hemodialysis Comments c/o chest pain at time. tx achieved as expected, alert, oriented and verbally responsive.  AVG/AVF Arterial Site Held (minutes) 0 minutes  AVG/AVF Venous Site Held (minutes) 0 minutes  Education / Care Plan  Dialysis Education Provided Yes  Documented Education in Care Plan Yes  Outpatient Plan of Care Reviewed and on Chart Yes  Hemodialysis Catheter Left Internal jugular Double lumen Permanent (Tunneled)  Placement Date/Time: 08/24/19 1042   Placed prior to admission: No  Time Out: Correct patient;Correct site;Correct procedure  Maximum sterile barrier precautions: Sterile gown;Mask;Hand hygiene;Sterile gloves;Large sterile sheet  Site Prep: Betadine;S...  Site Condition No complications  Blue Lumen Status Heparin locked;Capped (Central line)  Red Lumen Status Heparin locked;Capped (Central line)  Catheter fill solution Heparin 1000 units/ml  Catheter fill volume (Arterial) 2.1 cc  Catheter fill volume (Venous) 2.1  Dressing Type Occlusive  Dressing Status Clean;Dry;Intact  Interventions New dressing  Post  treatment catheter status Capped and Clamped

## 2019-10-05 NOTE — Progress Notes (Signed)
[facility] Progress Note  Subjective:  had HD on 9/26 with 0.7 kg UF.  Back on oral medications.  BP's improved most recently per available vitals.  Spilled his drinks and he states he's not getting up - spoke with the desk re: getting cleaned.   Review of systems:  Denies shortness of breath or chest pain Denies n/v   Vitals:   10/04/19 2330 10/04/19 2345 10/05/19 0030 10/05/19 0849  BP: (!) 162/85 (!) 193/87 (!) 157/74 127/61  Pulse: 78 91 81 72  Resp: 13 12 20 16   Temp:  99.1 F (37.3 C) 98.2 F (36.8 C) (!) 97.5 F (36.4 C)  TempSrc:  Oral  Oral  SpO2:  100% 99% 96%  Weight:  70 kg    Height:        Exam: General: NAD, chron ill appearing   Lungs: CTA bilaterally ; unlabored on room air  Heart: S1 S2 no rub Abdomen: soft NT ND  Ext: bilat BKA, no edema Neuro: Alert and oriented x 3 Provides hx and follows commands Dialysis Access: TDC chest and R BVT AVF with bruit and thrill   CXR - IMPRESSION: 1. No acute cardiopulmonary disease. 2. Mild left basilar atelectasis. 3. Mild cardiomegaly. 4. Similar position of the left-sided central venous catheter.  Dialysis Orders: [facility]TS  4h  68kg  2/2.5 bath  Hep 4200 (holding bolus heparin for now / ok for heparin block to [facility])  R BVT AVF (08/28/19, Dr Donnetta Hutching, maturing)  + L IJ TDC  - mircera 200 q 2 last 9/16  - venofer 100 x 3 more doses  - calcitriol 1 ug tiw Recent labs: hgb 9 trending up tsat 18%   Assessment/Plan: 1.  Hemorrhagic R brain CVA -per Neurology, hold heparin on HD. Ok to use to block [facility].  2.  ESRD -  TTS HD.  up 3kg by wts but no vol ^ on exam.  Would please reduce gabapentin to no more than 300 mg daily  3.  HTN/ volume - s/p IV cleviprex, keep SBP 100- 140 and 127/61 per most recent charting. normotensive/at goal on current regimen 4.  Anemia of CKD - most recent 9 tsat 18%  Continue repletion of Fe 5.  Metabolic bone disease -  Continue binders and VDRA - P much improved  lately in outpatient setting on 4 renvela ac 6.  Nutrition - renal carb mod with fluid restriction /added multivit   Recent Labs  Lab 10/03/19 0804 10/03/19 0804 10/03/19 0812 10/05/19 0231  K 4.6   < > 4.3 3.5  BUN 38*   < > 38* 17  CREATININE 7.59*   < > 8.10* 5.12*  CALCIUM 9.0  --   --  7.9*  PHOS  --   --   --  2.8  HGB 9.7*   < > 10.9* 8.7*   < > = values in this interval not displayed.   Inpatient medications: . amLODipine  10 mg Oral Daily  . atorvastatin  20 mg Oral QHS  . [START ON 10/06/2019] calcitRIOL  1 mcg Oral Q T,Th,Sa-HD  . carvedilol  12.5 mg Oral BID WC  . Chlorhexidine Gluconate Cloth  6 each Topical Q0600  . [START ON 10/08/2019] darbepoetin (ARANESP) injection - DIALYSIS  200 mcg Intravenous Q Thu-HD  . furosemide  20 mg Oral Daily  . gabapentin  300 mg Oral BID  . insulin aspart  0-6 Units Subcutaneous TID WC  . insulin aspart  3 Units Subcutaneous TID WC  . insulin glargine  10 Units Subcutaneous Daily  . labetalol  20 mg Intravenous Once  . multivitamin  1 tablet Oral QHS  . pantoprazole  40 mg Oral QHS  . senna-docusate  1 tablet Oral BID  . sevelamer carbonate  3,200 mg Oral TID WC  . sodium chloride flush  3 mL Intravenous Once  . Vitamin D (Ergocalciferol)  50,000 Units Oral Q Sat   . sodium chloride    . sodium chloride    . clevidipine Stopped (10/04/19 0020)  . [START ON 10/06/2019] ferric gluconate (FERRLECIT/NULECIT) IV     sodium chloride, sodium chloride, acetaminophen **OR** acetaminophen (TYLENOL) oral liquid 160 mg/5 mL **OR** acetaminophen, alteplase, heparin, labetalol, lidocaine (PF), lidocaine-prilocaine, pentafluoroprop-tetrafluoroeth   Claudia Desanctis, MD 10/05/2019 10:39 AM

## 2019-10-05 NOTE — Progress Notes (Signed)
Inpatient Rehab Admissions Coordinator:   I spoke with Pt.'s sister, Langley Gauss, who shares a  home with Pt. She states that she is a flight attendant and cannot provide much support at d/c. She states that family was looking into long term SNF placement prior to admission and that they would like to pursue SNF at discharge. I will request that CM reach out to her and Pt. Regarding a list of possible SNFs. I will not pursue CIR admission for this Pt.   Clemens Catholic, [place], [place] Admissions Coordinator  [phone] ([place]) [phone] (office)

## 2019-10-06 DIAGNOSIS — I610 Nontraumatic intracerebral hemorrhage in hemisphere, subcortical: Secondary | ICD-10-CM | POA: Diagnosis not present

## 2019-10-06 LAB — RENAL FUNCTION PANEL
Albumin: 2.7 g/dL — ABNORMAL LOW (ref 3.5–5.0)
Anion gap: 12 (ref 5–15)
BUN: 41 mg/dL — ABNORMAL HIGH (ref 6–20)
CO2: 22 mmol/L (ref 22–32)
Calcium: 8.4 mg/dL — ABNORMAL LOW (ref 8.9–10.3)
Chloride: 102 mmol/L (ref 98–111)
Creatinine, Ser: 7.7 mg/dL — ABNORMAL HIGH (ref 0.61–1.24)
GFR calc Af Amer: 9 mL/min — ABNORMAL LOW (ref >60)
GFR calc non Af Amer: 7 mL/min — ABNORMAL LOW (ref >60)
Glucose, Bld: 132 mg/dL — ABNORMAL HIGH (ref 70–99)
Phosphorus: 4.1 mg/dL (ref 2.5–4.6)
Potassium: 4.4 mmol/L (ref 3.5–5.1)
Sodium: 136 mmol/L (ref 135–145)

## 2019-10-06 LAB — CBC
HCT: 28.9 % — ABNORMAL LOW (ref 39.0–52.0)
Hemoglobin: 8.5 g/dL — ABNORMAL LOW (ref 13.0–17.0)
MCH: 29.4 pg (ref 26.0–34.0)
MCHC: 29.4 g/dL — ABNORMAL LOW (ref 30.0–36.0)
MCV: 100 fL (ref 80.0–100.0)
Platelets: 102 10*3/uL — ABNORMAL LOW (ref 150–400)
RBC: 2.89 MIL/uL — ABNORMAL LOW (ref 4.22–5.81)
RDW: 15.9 % — ABNORMAL HIGH (ref 11.5–15.5)
WBC: 5.9 10*3/uL (ref 4.0–10.5)
nRBC: 0 % (ref 0.0–0.2)

## 2019-10-06 LAB — GLUCOSE, CAPILLARY
Glucose-Capillary: 156 mg/dL — ABNORMAL HIGH (ref 70–99)
Glucose-Capillary: 81 mg/dL (ref 70–99)
Glucose-Capillary: 88 mg/dL (ref 70–99)
Glucose-Capillary: 121 mg/dL — ABNORMAL HIGH (ref 70–99)

## 2019-10-06 LAB — C DIFFICILE QUICK SCREEN W PCR REFLEX
C Diff antigen: POSITIVE — AB
C Diff toxin: NEGATIVE

## 2019-10-06 LAB — CLOSTRIDIUM DIFFICILE BY PCR, REFLEXED: Toxigenic C. Difficile by PCR: POSITIVE — AB

## 2019-10-06 MED ORDER — SEVELAMER CARBONATE 800 MG PO TABS
1600.0000 mg | ORAL_TABLET | Freq: Three times a day (TID) | ORAL | Status: DC
  Administered 2019-10-06 – 2019-10-16 (×25): 1600 mg via ORAL
  Filled 2019-10-06 (×25): qty 2

## 2019-10-06 MED ORDER — VANCOMYCIN 50 MG/ML ORAL SOLUTION
125.0000 mg | Freq: Four times a day (QID) | ORAL | Status: DC
  Administered 2019-10-06 – 2019-10-16 (×35): 125 mg via ORAL
  Filled 2019-10-06 (×43): qty 2.5

## 2019-10-06 MED ORDER — HEPARIN SODIUM (PORCINE) 1000 UNIT/ML IJ SOLN
INTRAMUSCULAR | Status: AC
  Filled 2019-10-06: qty 4

## 2019-10-06 NOTE — Progress Notes (Signed)
STROKE TEAM PROGRESS NOTE   INTERVAL HISTORY Currently in HD. Alert. Talkative. Questions about stroke etiology.  His neuro exam appears improved today with more alertness and better cognition.  Vital signs stable.  OBJECTIVE Vitals:   10/06/19 0900 10/06/19 0930 10/06/19 1000 10/06/19 1030  BP: (!) 101/52 (!) 94/57 107/62 131/64  Pulse: 63 66 65 63  Resp: 13 15 16 15   Temp:      TempSrc:      SpO2:      Weight:      Height:       CBC:  Recent Labs  Lab 10/03/19 0804 10/03/19 0812 10/05/19 0231 10/06/19 0803  WBC 7.6   < > 7.4 5.9  NEUTROABS 5.0  --   --   --   HGB 9.7*   < > 8.7* 8.5*  HCT 33.6*   < > 27.7* 28.9*  MCV 99.7   < > 95.5 100.0  PLT 94*   < > 104* 102*   < > = values in this interval not displayed.   Basic Metabolic Panel:  Recent Labs  Lab 10/05/19 0231 10/06/19 0803  NA 134* 136  K 3.5 4.4  CL 97* 102  CO2 27 22  GLUCOSE 156* 132*  BUN 17 41*  CREATININE 5.12* 7.70*  CALCIUM 7.9* 8.4*  PHOS 2.8 4.1    IMAGING past 24h No results found.  PHYSICAL EXAM    Pleasant middle-age African-American male not in distress.  He has bilateral below-knee amputations. Afebrile. Head is nontraumatic. Neck is supple without bruit.    Cardiac exam no murmur or gallop. Lungs are clear to auscultation. Distal pulses are well felt. Neurological Exam :  He is awake and interactive.  Oriented to time place and person.  Extraocular movements are full range on nystagmus.  Mild dysarthria no aphasia.  Left lower facial weakness.  Tongue midline.  Motor system exam shows mild left upper and lower extremity drift with 4/5 strength in the left upper extremity proximally and distally.  And 4+/5 strength in the left lower extremity at the thighs.  Cannot test distally in lower extremities due to bilateral amputation.  Sensation appears preserved bilaterally.  Plantars are downgoing.  Gait not tested.   ASSESSMENT/PLAN [name] is a 49 y.o. male with history of  diabetes with gastroparesis, decreased vision left eye, hypertension, BKA, ESRD on peritoneal dialysis, presented to the emergency room with sudden onset of left-sided weakness, confusion, slurred speech and headache. Blood sugars in the 35K, systolic blood pressure 093G. Stat CT c/w 15 cc hematoma in the right putamen and adjacent white matter with a small volume subarachnoid extension locally. Small remote left frontal cortex insult and small remote left cerebellar infarct. He did not receive IV t-PA due to [place].  Stroke: R putamen and adjacent white matter ICH w/ IVH & SAH   Resultant mild left hemiparesis  Code Stroke CT Head - 15 cc acute hematoma at the right putamen and adjacent white matter. Small volume subarachnoid extension seen locally. Small remote left frontal cortex insult, usually infarct. Small remote left cerebellar infarct.   CT head - stable R putamen/external capsule IPH measuring 3.5 cm. New IVH R lateral ventricle. SAH L insula more defined  [employer] Virus 2  - negative  LDL - 54  HgbA1c - 4.9  VTE prophylaxis - SCDs    aspirin 81 mg daily prior to admission, now on No antithrombotic given hemorrhage   Therapy recommendations:  CIR->SNF as there is limited support at home and family looking for SNF placement PTA  Disposition:  Pending  Hypertension  Home BP meds: amlodipine ; Coreg ; catapres  Current BP meds: amlodipine 10, coreg 12.5 bid, lasix 20, prn Labetalol   . BP up to 190s last pm w/o prns administered . SBP goal < 140 mm Hg initially->now < 160 . Long-term BP goal normotensive  Hyperlipidemia  Home Lipid lowering medication: Lipitor 20 mg daily  LDL 54, goal < 70  Current lipid lowering medication:  None (statin contraindicated with ICH)  Continue statin at discharge  Diabetes  Home diabetic meds: insulin  Current diabetic meds: lantus 10, SSI 0-6  HgbA1c 4.9, goal < 7.0 Recent Labs    10/05/19 1601 10/05/19 2211 10/06/19 0231   GLUCAP 112* 135* 156*    Other Stroke Risk Factors  Former cigarette smoker - quit  Previous ETOH use  Family hx stroke (brother)   Hx stroke/TIA by imaging  Previous substance abuse - cocaine and marijuana   Other Active Problems Code status - Full code ESRD - HD TTS - creatinine - 7.59->8.10->5.12->7.7 Anemia of chronic kidney disease  - Hgb - 9.7->10.9->8.7->8.5 Thrombocytopenia - platelets - 94->104->102  Hyponatremia - Na - 136 - resolved Metabolic bone disease Large loose watery stools. C diff positive. Added Vanc per protocol.    Hospital day # 3  Continue ongoing therapies and hopefully transfer to inpatient rehab over the next few days when bed available.  Discussed with patient and rehab coordinator.  Greater than 50% time during this 25-minute visit was spent in counseling and coordination of care about his intracerebral hemorrhage and answering questions Antony Contras, MD    To contact Stroke Continuity provider, please refer to [email or web address] After hours, contact General Neurology

## 2019-10-06 NOTE — Progress Notes (Signed)
[facility] Progress Note  Subjective:  Seen and examined on dialysis.  Blood pressure 104/56 and HR 64.  Left IJ tunn catheter.  UF goal was reduced and spoke with nursing to reduce again if hypotension or discomfort.   Review of systems:  Denies shortness of breath or chest pain; chest discomfort last tx and hasn't happened since. Denies n/v  Diarrhea overnight   Vitals:   10/05/19 2200 10/06/19 0740 10/06/19 0743 10/06/19 0800  BP: 109/73 (!) 118/59 112/65 (!) 99/59  Pulse: 71 68 67 65  Resp:  16 15 (!) 22  Temp:  97.8 F (36.6 C)    TempSrc:  Oral    SpO2:  99%    Weight:  68.9 kg    Height:        Exam: General: NAD, chron ill appearing  Lungs: CTA bilaterally ; unlabored on room air  Heart: S1 S2 no rub Abdomen: soft NT ND  Ext: bilat BKA, no edema Neuro: Alert and oriented x 3 Provides hx and follows commands Psych normal mood and affect Dialysis Access: TDC left chest and R BVT AVF with bruit and thrill   CXR - IMPRESSION: 1. No acute cardiopulmonary disease. 2. Mild left basilar atelectasis. 3. Mild cardiomegaly. 4. Similar position of the left-sided central venous catheter.  Dialysis Orders: [facility]  4h  68kg  2/2.5 bath  Hep 4200 (holding bolus heparin for now / ok for heparin block to [facility])  R BVT AVF (08/28/19, Dr Donnetta Hutching, maturing)  + L IJ TDC  - mircera 200 q 2 last 9/16  - venofer 100 x 3 more doses  - calcitriol 1 ug tiw Recent labs: hgb 9 trending up tsat 18%   Assessment/Plan: 1.  Hemorrhagic R brain CVA -per Neurology, hold heparin on HD. Ok to use to block [facility].  2.  ESRD -  TTS HD. Will follow today's labs   3.  HTN/ volume - s/p IV cleviprex, per primary team/stroke in setting of CVA.  initial goal keep SBP < 140;now less than 160  4. Anemia of CKD - most recent 9 tsat 18%  Continue repletion of Fe 5.  Metabolic bone disease -  Continue binders and VDRA - reduce renvela from 4 with meals to 2 with meals  6.  Nutrition -  renal carb mod with fluid restriction /added multivit   Recent Labs  Lab 10/03/19 0804 10/03/19 0804 10/03/19 0812 10/05/19 0231  K 4.6   < > 4.3 3.5  BUN 38*   < > 38* 17  CREATININE 7.59*   < > 8.10* 5.12*  CALCIUM 9.0  --   --  7.9*  PHOS  --   --   --  2.8  HGB 9.7*   < > 10.9* 8.7*   < > = values in this interval not displayed.   Inpatient medications: . amLODipine  10 mg Oral Daily  . atorvastatin  20 mg Oral QHS  . calcitRIOL  1 mcg Oral Q T,Th,Sa-HD  . carvedilol  12.5 mg Oral BID WC  . Chlorhexidine Gluconate Cloth  6 each Topical Q0600  . furosemide  20 mg Oral Daily  . gabapentin  300 mg Oral Daily  . insulin aspart  0-6 Units Subcutaneous TID WC  . insulin aspart  3 Units Subcutaneous TID WC  . insulin glargine  10 Units Subcutaneous Daily  . multivitamin  1 tablet Oral QHS  . pantoprazole  40 mg Oral QHS  .  senna-docusate  1 tablet Oral BID  . sevelamer carbonate  3,200 mg Oral TID WC  . Vitamin D (Ergocalciferol)  50,000 Units Oral Q Sat   . sodium chloride    . sodium chloride    . ferric gluconate (FERRLECIT/NULECIT) IV     sodium chloride, sodium chloride, acetaminophen **OR** acetaminophen (TYLENOL) oral liquid 160 mg/5 mL **OR** acetaminophen, alteplase, heparin, labetalol, lidocaine (PF), lidocaine-prilocaine, pentafluoroprop-tetrafluoroeth   Claudia Desanctis, MD 10/06/2019 8:23 AM

## 2019-10-06 NOTE — Progress Notes (Signed)
SLP Cancellation Note  Patient Details Name: Jorge [name] MRN: [id number] DOB: 05-20-1970   Cancelled treatment:       Reason Eval/Treat Not Completed: Patient at procedure or test/unavailable - pt currently in HD. Will continue efforts for diagnostic/cognitive treatment.   Lovie Zarling B. Quentin Ore, [facility], [place] Speech Language Pathologist Office: [phone]  Shonna Chock 10/06/2019, 8:47 AM

## 2019-10-06 NOTE — Progress Notes (Signed)
Throughout 12hr shift Pt had multiple x-large loose/watery stools. Notified MD of loose/watery stools, foul odor; and of color. N.O received for stool sample culture for c-diff - lab results pending; also received N.O for Flexiseal placement - completed. Patient understood. Pt placed on enteric precautions.

## 2019-10-06 NOTE — Progress Notes (Signed)
  Speech Language Pathology Treatment: Cognitive-Linquistic  Patient Details Name: Jorge [name] MRN: [id number] DOB: [birth year] Today's Date: 10/06/2019 Time: 7672-0947 SLP Time Calculation (min) (ACUTE ONLY): 30 min  Assessment / Plan / Recommendation Clinical Impression  Pt was seen at bedside for skilled ST intervention targeting goals for completion of diagnostic treatment to further identify cognitive linguistic deficits. Pt was alert and cooperative. No family present. He reports he was "out of it" over the weekend when first evaluated, and feels he has cleared cognitively. He indicates his vision has not returned to normal, but is improving on a daily basis. The [place] Mental Status (SLUMS) Examination was administered, except for visual items (clock drawing and figure discrimination). Pt scored 22/24 points, indicating performance within functional limits for pt education (some college). Difficulty noted on thought organization (naming animals).  ST will continue to follow pt acutely, and will recommend continued ST at next level of care.    HPI HPI: Jorge [name] is a 49 y.o. male past medical history of diabetes with gastroparesis, hypertension, BKA, ESRD on peritoneal dialysis, with no prior medical history of strokes, presented to the emergency room for sudden onset of left-sided weakness and confusion.  CT 9/25 revealed "right putamen/external capsule intraparenchymal hematoma measuring 3.5 cm.  New intraventricular extension of hemorrhage involving the rightlateral ventricle. Subarachnoid hemorrhage overlying the left insula is moreconspicuous than prior exam."      SLP Plan  Continued ST intervention at next venue      Recommendations   CIR - Inpatient Rehab                Follow up Recommendations: Inpatient Rehab SLP Visit Diagnosis: Cognitive communication deficit (R41.841) Plan: Other (Comment) (F/U at next venue)       Jorge [name].  Quentin Ore, [facility], [place] Speech Language Pathologist Office: [phone]  Shonna Chock 10/06/2019, 12:36 PM

## 2019-10-06 NOTE — Progress Notes (Signed)
PT Cancellation Note  Patient Details Name: ASHAUN GAUGHAN MRN: [id number] DOB: [birth year]   Cancelled Treatment:    Reason Eval/Treat Not Completed: Patient at procedure or test/unavailable - at HD, PT to check back as schedule allows.  Marisa Cyphers, PT Acute Rehabilitation Services Pager [phone]  Office [phone]     Julien Girt 10/06/2019, 10:49 AM

## 2019-10-06 NOTE — Progress Notes (Signed)
Inpatient Rehab Admissions Coordinator:   I met with Pt. At bedside to discuss CIR admission. He remains interested. I also spoke with his daughter who stated that family had been looking into ALF placement due to their worry that Pt. Would be unable to call for help in an emergency. They report he was independent prior to admission. Pt. States that he does not want to go to SNF and would prefer to go to CIR and then discharge home at mod I level. SLP noted improved cognition today and stated that she did not feel cognition would be a barrier to returning to independent living. Pt. is aware that his insurance will not cover SNF after CIR. Pt. Agreed that he will either get his family on board to come home or line up an ALF for his discharge after CIR. I have opened a case with his insurance and await authorization.    Clemens Catholic, [place], [place] Admissions Coordinator  [phone] ([place]) [phone] (office)

## 2019-10-07 ENCOUNTER — Encounter: Payer: Medicaid Other | Admitting: Rehabilitation

## 2019-10-07 ENCOUNTER — Ambulatory Visit: Payer: Medicare Other | Admitting: Rehabilitation

## 2019-10-07 DIAGNOSIS — I610 Nontraumatic intracerebral hemorrhage in hemisphere, subcortical: Secondary | ICD-10-CM | POA: Diagnosis not present

## 2019-10-07 LAB — GLUCOSE, CAPILLARY
Glucose-Capillary: 104 mg/dL — ABNORMAL HIGH (ref 70–99)
Glucose-Capillary: 82 mg/dL (ref 70–99)
Glucose-Capillary: 162 mg/dL — ABNORMAL HIGH (ref 70–99)
Glucose-Capillary: 97 mg/dL (ref 70–99)

## 2019-10-07 MED ORDER — CHLORHEXIDINE GLUCONATE CLOTH 2 % EX PADS
6.0000 | MEDICATED_PAD | Freq: Every day | CUTANEOUS | Status: DC
  Administered 2019-10-08 – 2019-10-09 (×2): 6 via TOPICAL

## 2019-10-07 MED ORDER — INFLUENZA VAC SPLIT QUAD 0.5 ML IM SUSY
0.5000 mL | PREFILLED_SYRINGE | INTRAMUSCULAR | Status: AC | PRN
  Administered 2019-10-16: 0.5 mL via INTRAMUSCULAR
  Filled 2019-10-07 (×3): qty 0.5

## 2019-10-07 MED ORDER — PNEUMOCOCCAL VAC POLYVALENT 25 MCG/0.5ML IJ INJ
0.5000 mL | INJECTION | INTRAMUSCULAR | Status: AC | PRN
  Administered 2019-10-16: 0.5 mL via INTRAMUSCULAR
  Filled 2019-10-07: qty 0.5

## 2019-10-07 NOTE — Progress Notes (Signed)
IP rehab admissions - I spoke with patient's sister and daughter by phone.  I do have approval for inpatient rehab admission.  After discussion with patient's sister, sister prefers to pursue SNF placement in hopes of getting patient into ALF after SNF stay.  Sister works and is gone overnight at times as a Catering manager.  Other family are not available on a daily, prn basis.  There ultimate goal is for patient to be placed as it is not safe for him at the sisters home with no one there on a 24/7 hour basis.  I will not plan CIR admit.  Call me for questions.  [phone]

## 2019-10-07 NOTE — TOC Initial Note (Signed)
Transition of Care [facility]) - Initial/Assessment Note    Patient Details  Name: Jorge [name] MRN: [id number] Date of Birth: Aug 22, 1970  [facility] CM/SW Contact:    Joanne Chars, LCSW Phone Number: 10/07/2019, 3:33 PM  Clinical Narrative:     CSW spoke with pt regarding SNF recommendation.  Pt agreeable to this after initially considering CIR.  Permission given to send info out on hub for SNF, also given to speak with sister, daughter, and son.  Pt is vaccinated.  Choice document provided and explained.                Expected Discharge Plan: Skilled Nursing Facility Barriers to Discharge: Continued Medical Work up, SNF Pending bed offer   Patient Goals and CMS Choice Patient states their goals for this hospitalization and ongoing recovery are:: get back home CMS Medicare.gov Compare Post Acute Care list provided to:: Patient Choice offered to / list presented to : Patient  Expected Discharge Plan and Services Expected Discharge Plan: [place] Choice: [place] arrangements for the past 2 months: Single Family Home                                      Prior Living Arrangements/Services Living arrangements for the past 2 months: Single Family Home Lives with:: Siblings Patient language and need for interpreter reviewed:: Yes Do you feel safe going back to the place where you live?: Yes      Need for Family Participation in Patient Care: No (Comment) Care giver support system in place?: Yes (comment)   Criminal Activity/Legal Involvement Pertinent to Current Situation/Hospitalization: No - Comment as needed  Activities of Daily Living      Permission Sought/Granted Permission sought to share information with : Facility Sport and exercise psychologist, Family Supports Permission granted to share information with : Yes, Verbal Permission Granted  Share Information with NAME: Langley Gauss, sister,  [name] Daughter, [place] son  Permission granted to share info w AGENCY: SNF        Emotional Assessment Appearance:: Appears older than stated age Attitude/Demeanor/Rapport: Engaged Affect (typically observed): Pleasant Orientation: : Oriented to Self, Oriented to Place, Oriented to  Time, Oriented to Situation Alcohol / Substance Use: Not Applicable Psych Involvement: No (comment)  Admission diagnosis:  Cough [R05] Hemorrhagic stroke ([place]) [I61.9] ICH (intracerebral hemorrhage) ([place]) [I61.9] Patient Active Problem List   Diagnosis Date Noted  . ICH (intracerebral hemorrhage) ([place]) 10/03/2019  . Abnormality of albumin 09/10/2019  . Fever, unspecified 08/28/2019  . Personal history of anaphylaxis 08/28/2019  . Pruritus, unspecified 08/28/2019  . Shortness of breath 08/28/2019  . Hypotension   . Hyperkalemia 08/22/2019  . DKA (diabetic ketoacidoses) ([place]) 08/22/2019  . Hypertensive urgency 08/22/2019  . Disorder of lipoprotein metabolism, unspecified 05/18/2019  . Coagulation defect, unspecified ([place]) 05/05/2019  . Gastroparesis due to DM ([place]) 04/28/2019  . Contact with and (suspected) exposure to covid-19 04/28/2019  . Diarrhea, unspecified 04/28/2019  . Complication of vascular dialysis catheter 04/25/2019  . Fall   . Hypoglycemia   . Labile blood glucose   . Anemia of chronic disease   . Acute on chronic anemia   . S/P BKA (below knee amputation) bilateral ([place]) 03/31/2019  . Severe protein-calorie malnutrition ([place]) 03/20/2019  . ESRD on peritoneal dialysis ([place])   . End-stage renal disease needing dialysis ([place]) 03/19/2019  .  Hypertension   . Insulin-requiring or dependent type II diabetes mellitus ([place])   . Normocytic anemia   . Allergy, unspecified, sequela 03/09/2019  . Contact with and (suspected) exposure to tuberculosis 01/13/2019  . Local infection of the skin and subcutaneous tissue, unspecified 01/13/2019  . Acidosis 01/10/2019  . Encounter for  adequacy testing for peritoneal dialysis ([place]) 01/10/2019  . Liver disease, unspecified 01/10/2019  . Other abnormal findings in urine 01/10/2019  . Other disorders of bilirubin metabolism 01/10/2019  . Other long term (current) drug therapy 01/10/2019  . Generalized (acute) peritonitis ([place]) 01/06/2019  . Hypoparathyroidism, unspecified ([place]) 07/30/2016  . 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  . Disorders of magnesium metabolism, unspecified 07/05/2016  . Disorder of phosphorus metabolism, unspecified 07/05/2016  . Iron deficiency anemia, unspecified 07/05/2016  . Nutritional deficiency, unspecified 07/05/2016  . Occupational exposure to other risk factors 07/05/2016  . Other disorders of electrolyte and fluid balance, not elsewhere classified 07/05/2016  . Other disorders resulting from impaired renal tubular function 07/05/2016  . Secondary hyperparathyroidism of renal origin ([place]) 07/05/2016  . Deficiency of other specified B group vitamins 07/05/2016   PCP:  Lauree Chandler, NP Pharmacy:   [facility] [address] [place] [address] Phone: [phone] Fax: [phone]  [id number] - [place], [address] AT [address] [place] [address] Phone: [phone] Fax: [phone]     Social Determinants of Health (SDOH) Interventions    Readmission Risk Interventions No flowsheet data found.

## 2019-10-07 NOTE — Progress Notes (Signed)
[facility] Progress Note  Subjective:  last had HD on 9/28 with 0.5 kg UF.  He may be going to rehab.  States HD is going well and denies any dizziness or cramping  Review of systems:   Denies shortness of breath or chest pain Denies n/v  Diarrhea overnight - diagnosed with c dif  Vitals:   10/06/19 1205 10/06/19 1711 10/06/19 2312 10/07/19 0823  BP: 109/69 116/71 (!) 142/78 130/62  Pulse: 64 66 73 63  Resp: 14 17 15 16   Temp: 98.3 F (36.8 C) 97.8 F (36.6 C) 98 F (36.7 C) 98.3 F (36.8 C)  TempSrc:   Oral   SpO2: 100% 100% 100% 100%  Weight:      Height:        Exam: General: adult male in bed in NAD  Lungs: CTA bilaterally ; unlabored on room air  Heart: S1 S2 no rub Abdomen: soft NT ND  Ext: bilat BKA, no edema residual limbs Neuro: Alert and oriented x 3 Provides hx and follows commands Psych normal mood and affect Dialysis Access: TDC left chest and R BVT AVF with bruit and thrill   CXR - IMPRESSION: 1. No acute cardiopulmonary disease. 2. Mild left basilar atelectasis. 3. Mild cardiomegaly. 4. Similar position of the left-sided central venous catheter.  Dialysis Orders: [facility]  4h  68kg  2/2.5 bath  Hep 4200 (holding bolus heparin for now / ok for heparin block to [facility])  R BVT AVF (08/28/19, Dr Donnetta Hutching, maturing)  + L IJ TDC  - mircera 200 q 2 last 9/16  - venofer 100 x 3 more doses  - calcitriol 1 ug tiw Recent labs: hgb 9 trending up tsat 18%   Assessment/Plan: 1.  Hemorrhagic R brain CVA -per Neurology, hold heparin on HD. Ok to use to block [facility].  2.  ESRD -  TTS HD. Labs in AM  3.  HTN/ volume - s/p IV cleviprex, per primary team/stroke in setting of CVA.  initial goal keep SBP < 140;now less than 160  4. Anemia of CKD - most recent 9 tsat 18%  Continue repletion of Fe.  5.  Metabolic bone disease -  Continue binders and VDRA - reduced renvela from 4 with meals to 2 with meals  6.  Nutrition - renal carb mod with fluid  restriction  7. C dif colitis - per primary team; on PO vanc   Disposition per primary team   Recent Labs  Lab 10/05/19 0231 10/06/19 0803  K 3.5 4.4  BUN 17 41*  CREATININE 5.12* 7.70*  CALCIUM 7.9* 8.4*  PHOS 2.8 4.1  HGB 8.7* 8.5*   Inpatient medications: . amLODipine  10 mg Oral Daily  . atorvastatin  20 mg Oral QHS  . calcitRIOL  1 mcg Oral Q T,Th,Sa-HD  . carvedilol  12.5 mg Oral BID WC  . Chlorhexidine Gluconate Cloth  6 each Topical Q0600  . furosemide  20 mg Oral Daily  . gabapentin  300 mg Oral Daily  . insulin aspart  0-6 Units Subcutaneous TID WC  . insulin aspart  3 Units Subcutaneous TID WC  . insulin glargine  10 Units Subcutaneous Daily  . multivitamin  1 tablet Oral QHS  . pantoprazole  40 mg Oral QHS  . senna-docusate  1 tablet Oral BID  . sevelamer carbonate  1,600 mg Oral TID WC  . vancomycin  125 mg Oral QID  . Vitamin D (Ergocalciferol)  50,000 Units Oral  Q Sat   . ferric gluconate (FERRLECIT/NULECIT) IV     acetaminophen **OR** acetaminophen (TYLENOL) oral liquid 160 mg/5 mL **OR** acetaminophen, influenza vac split quadrivalent PF, labetalol, pneumococcal 23 valent vaccine   Claudia Desanctis, MD 10/07/2019 11:20 AM

## 2019-10-07 NOTE — Progress Notes (Signed)
STROKE TEAM PROGRESS NOTE   INTERVAL HISTORY Patient is sitting up in bedside chair. He states he wants to go to inpatient rehab and his family will provide the support. Vital signs are stable. Neurological exam is unchanged.  OBJECTIVE Vitals:   10/06/19 1205 10/06/19 1711 10/06/19 2312 10/07/19 0823  BP: 109/69 116/71 (!) 142/78 130/62  Pulse: 64 66 73 63  Resp: 14 17 15 16   Temp: 98.3 F (36.8 C) 97.8 F (36.6 C) 98 F (36.7 C) 98.3 F (36.8 C)  TempSrc:   Oral   SpO2: 100% 100% 100% 100%  Weight:      Height:       CBC:  Recent Labs  Lab 10/03/19 0804 10/03/19 0812 10/05/19 0231 10/06/19 0803  WBC 7.6   < > 7.4 5.9  NEUTROABS 5.0  --   --   --   HGB 9.7*   < > 8.7* 8.5*  HCT 33.6*   < > 27.7* 28.9*  MCV 99.7   < > 95.5 100.0  PLT 94*   < > 104* 102*   < > = values in this interval not displayed.   Basic Metabolic Panel:  Recent Labs  Lab 10/05/19 0231 10/06/19 0803  NA 134* 136  K 3.5 4.4  CL 97* 102  CO2 27 22  GLUCOSE 156* 132*  BUN 17 41*  CREATININE 5.12* 7.70*  CALCIUM 7.9* 8.4*  PHOS 2.8 4.1    IMAGING past 24h No results found.  PHYSICAL EXAM    Pleasant middle-age African-American male not in distress.  He has bilateral below-knee amputations. Afebrile. Head is nontraumatic. Neck is supple without bruit.    Cardiac exam no murmur or gallop. Lungs are clear to auscultation. Distal pulses are well felt. Neurological Exam :  He is awake and interactive.  Oriented to time place and person.  Extraocular movements are full range on nystagmus.  Mild dysarthria no aphasia.  Left lower facial weakness.  Tongue midline.  Motor system exam shows mild left upper and lower extremity drift with 4/5 strength in the left upper extremity proximally and distally.  And 4+/5 strength in the left lower extremity at the thighs.  Cannot test distally in lower extremities due to bilateral amputation.  Sensation appears preserved bilaterally.  Plantars are downgoing.   Gait not tested.   ASSESSMENT/PLAN Mr. MICHAL CALLICOTT is a 49 y.o. male with history of diabetes with gastroparesis, decreased vision left eye, hypertension, BKA, ESRD on peritoneal dialysis, presented to the emergency room with sudden onset of left-sided weakness, confusion, slurred speech and headache. Blood sugars in the 07E, systolic blood pressure 675Q. Stat CT c/w 15 cc hematoma in the right putamen and adjacent white matter with a small volume subarachnoid extension locally. Small remote left frontal cortex insult and small remote left cerebellar infarct. He did not receive IV t-PA due to [place].  Stroke: R putamen and adjacent white matter ICH w/ IVH & SAH   Resultant mild left hemiparesis  Code Stroke CT Head - 15 cc acute hematoma at the right putamen and adjacent white matter. Small volume subarachnoid extension seen locally. Small remote left frontal cortex insult, usually infarct. Small remote left cerebellar infarct.   CT head - stable R putamen/external capsule IPH measuring 3.5 cm. New IVH R lateral ventricle. SAH L insula more defined  [employer] Virus 2  - negative  LDL - 54  HgbA1c - 4.9  VTE prophylaxis - SCDs    aspirin 81  mg daily prior to admission, now on No antithrombotic given hemorrhage   Therapy recommendations:  CIR->SNF as there is limited support at home and family looking for placement PTA  Disposition:  Pending  Hypertension  Home BP meds: amlodipine ; Coreg ; catapres  Current BP meds: amlodipine 10, coreg 12.5 bid, lasix 20, prn Labetalol    BP up to 190s last pm w/o prns administered  SBP goal < 140 mm Hg initially->now < 160  Long-term BP goal normotensive  Hyperlipidemia  Home Lipid lowering medication: Lipitor 20 mg daily  LDL 54, goal < 70  Current lipid lowering medication:  None (statin contraindicated with ICH)  Continue statin at discharge  Diabetes  Home diabetic meds: insulin  Current diabetic meds: lantus 10, SSI  0-6  HgbA1c 4.9, goal < 7.0 Recent Labs    10/06/19 2119 10/07/19 0825 10/07/19 1151  GLUCAP 121* 104* 82    Other Stroke Risk Factors  Former cigarette smoker - quit  Previous ETOH use  Family hx stroke (brother)   Hx stroke/TIA by imaging  Previous substance abuse - cocaine and marijuana   Other Active Problems Code status - Full code ESRD - HD TTS - creatinine - 7.59->8.10->5.12->7.7 Anemia of chronic kidney disease  - Hgb - 9.7->10.9->8.7->8.5 Thrombocytopenia - platelets - 94->104->102  Hyponatremia - Na - 136 - resolved Metabolic bone disease Large loose watery stools. C diff positive. Added Vanc per protocol.    Hospital day # 4  Continue ongoing therapies. Rehab coordinator to discuss with patient and family to see if he qualifies for inpatient rehab. Patient may also consider possible participation in the [place] trial of statin usage post intracerebral hemorrhage if interested. Greater than 50% time during this time 25-minute visit was spent on counseling and coordination of care about his intracerebral hemorrhage and plan for rehabilitation and answering questions. Antony Contras, MD    To contact Stroke Continuity provider, please refer to [email or web address] After hours, contact General Neurology

## 2019-10-07 NOTE — Progress Notes (Signed)
Physical Therapy Treatment Patient Details Name: Jorge [name] MRN: [id number] DOB: 01-Jul-1970 Today's Date: 10/07/2019    History of Present Illness 49 y.o. 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 Stat CT performed, consistent with 15 cc hematoma in the right putamen and adjacent white matter with a small volume subarachnoid extension locally.  Small remote left frontal cortex insult and small remote left cerebellar infarct.    PT Comments    Pt much more alert and interactive with PT and OT today. Pt tolerated gait training for hallway distance today, requiring max multimodal cuing for safety and form as well as min +2 assist. Pt with continued LLE weakness, demonstrated in poor foot clearance during gait, and L inattention/field cut. Pt responds well to verbal and visual cues to correct. Per CIR admission team, pt's family's wish is d/c to SNF level of care. PT recommendation updated to reflect this wish. Will continue to follow acutely.    Follow Up Recommendations  SNF     Equipment Recommendations  Other (comment) (TBD)    Recommendations for Other Services       Precautions / Restrictions Precautions Precautions: Fall Precaution Comments: bilateral BKA Restrictions Weight Bearing Restrictions: No    Mobility  Bed Mobility Overal bed mobility: Needs Assistance Bed Mobility: Supine to Sit     Supine to sit: Min guard     General bed mobility comments: for safety, heavy anterior leaning when scooting to EOB.  Transfers Overall transfer level: Needs assistance Equipment used: Rolling walker (2 wheeled) Transfers: Sit to/from Stand Sit to Stand: Min assist;+2 physical assistance;+2 safety/equipment         General transfer comment:  cueing for hand placement, safety, inital standing balance and RW mgmt   Ambulation/Gait Ambulation/Gait assistance: Min assist;+2 safety/equipment;+2 physical assistance Gait Distance (Feet): 90 Feet Assistive device: Rolling walker (2 wheeled) Gait Pattern/deviations: Step-through pattern;Decreased stride length;Trunk flexed;Drifts right/left Gait velocity: decr   General Gait Details: min +2 for steadying, physically holding RW at times, and guiding trunk during directional changes. Verbal cuing for upright posture x2, placement within RW and in middle of RW as pt with tendency to stand toward the L, increasing foot clearance via hip and knee flexion L>R, turning RW. Pt leaving RW behind x2 during transitional movements (standing at sink>gait, gait>recliner)   Stairs             Wheelchair Mobility    Modified Rankin (Stroke Patients Only) Modified Rankin (Stroke Patients Only) Pre-Morbid Rankin Score: Moderate disability Modified Rankin: Moderately severe disability     Balance Overall balance assessment: Needs assistance Sitting-balance support: No upper extremity supported;Feet supported Sitting balance-Leahy Scale: Fair Sitting balance - Comments: close supervision dynamically   Standing balance support: Bilateral upper extremity supported;During functional activity;No upper extremity supported Standing balance-Leahy Scale: Poor Standing balance comment: reliant on external support                            Cognition Arousal/Alertness: Awake/alert Behavior During Therapy: WFL for tasks assessed/performed Overall Cognitive Status: Impaired/Different from baseline Area of Impairment: Safety/judgement;Awareness;Problem solving;Attention;Following commands                   Current Attention Level: Selective   Following Commands: Follows multi-step commands inconsistently;Follows one  step commands with increased time Safety/Judgement: Decreased  awareness of safety;Decreased awareness of deficits Awareness: Emergent Problem Solving: Difficulty sequencing;Requires verbal cues General Comments: L inattention and L visual field cut; requires multimodal cuing for safety during mobility and responds well to external visual cues (look at your feet in the RW, place yourself in the middle)      Exercises      General Comments General comments (skin integrity, edema, etc.): L visual field cut      Pertinent Vitals/Pain Pain Assessment: No/denies pain Faces Pain Scale: No hurt Pain Intervention(s): Monitored during session    Home Living                      Prior Function            PT Goals (current goals can now be found in the care plan section) Acute Rehab PT Goals Patient Stated Goal: To return to independence PT Goal Formulation: With patient Time For Goal Achievement: 10/18/19 Potential to Achieve Goals: Good Progress towards PT goals: Progressing toward goals    Frequency    Min 3X/week      PT Plan Discharge plan needs to be updated    Co-evaluation PT/OT/SLP Co-Evaluation/Treatment: Yes Reason for Co-Treatment: For patient/therapist safety;To address functional/ADL transfers          AM-PAC PT "6 Clicks" Mobility   Outcome Measure  Help needed turning from your back to your side while in a flat bed without using bedrails?: A Little Help needed moving from lying on your back to sitting on the side of a flat bed without using bedrails?: A Little Help needed moving to and from a bed to a chair (including a wheelchair)?: A Little Help needed standing up from a chair using your arms (e.g., wheelchair or bedside chair)?: A Little Help needed to walk in hospital room?: A Lot Help needed climbing 3-5 steps with a railing? : Total 6 Click Score: 15    End of Session   Activity Tolerance: Patient tolerated treatment well Patient left: with call bell/phone within reach;in chair;with chair alarm  set;with nursing/sitter in room Nurse Communication: Mobility status PT Visit Diagnosis: Other abnormalities of gait and mobility (R26.89);Muscle weakness (generalized) (M62.81);Other symptoms and signs involving the nervous system (R29.898)     Time: 4696-2952 PT Time Calculation (min) (ACUTE ONLY): 25 min  Charges:  $Gait Training: 8-22 mins                    Latajah Thuman E, PT Acute Rehabilitation Services Pager [phone]  Office [phone]    Yakov Bergen D Elonda Husky 10/07/2019, 3:50 PM

## 2019-10-07 NOTE — NC FL2 (Addendum)
[place] LEVEL OF CARE SCREENING TOOL     IDENTIFICATION  Patient Name: Jorge [name] Birthdate: 11-13-1970 Sex: male Admission Date (Current Location): 10/03/2019  [place] and [place] Number:  Kathleen Argue [id number] [place] and Address:  [facility] [place], [place] [address], [place], [place] [address]      Provider Number: [id number]  Attending Physician Name and Address:  Garvin Fila, MD  Relative Name and Phone Number:  Dario, Yono   [phone]    Current Level of Care: Hospital Recommended Level of Care: [place] Prior Approval Number:    Date Approved/Denied:   PASRR Number:    Discharge Plan: SNF    Current Diagnoses: Patient Active Problem List   Diagnosis Date Noted   ICH (intracerebral hemorrhage) ([place]) 10/03/2019   Abnormality of albumin 09/10/2019   Fever, unspecified 08/28/2019   Personal history of anaphylaxis 08/28/2019   Pruritus, unspecified 08/28/2019   Shortness of breath 08/28/2019   Hypotension    Hyperkalemia 08/22/2019   DKA (diabetic ketoacidoses) ([place]) 08/22/2019   Hypertensive urgency 08/22/2019   Disorder of lipoprotein metabolism, unspecified 05/18/2019   Coagulation defect, unspecified ([place]) 05/05/2019   Gastroparesis due to DM ([place]) 04/28/2019   Contact with and (suspected) exposure to covid-19 04/28/2019   Diarrhea, unspecified 38/93/7342   Complication of vascular dialysis catheter 04/25/2019   Fall    Hypoglycemia    Labile blood glucose    Anemia of chronic disease    Acute on chronic anemia    S/P BKA (below knee amputation) bilateral ([place]) 03/31/2019   Severe protein-calorie malnutrition ([place]) 03/20/2019   ESRD on peritoneal dialysis ([place])    End-stage renal disease needing dialysis ([place]) 03/19/2019   Hypertension    Insulin-requiring or dependent type II diabetes mellitus ([place])    Normocytic anemia    Allergy, unspecified, sequela 03/09/2019   Contact with and  (suspected) exposure to tuberculosis 01/13/2019   Local infection of the skin and subcutaneous tissue, unspecified 01/13/2019   Acidosis 01/10/2019   Encounter for adequacy testing for peritoneal dialysis ([place]) 01/10/2019   Liver disease, unspecified 01/10/2019   Other abnormal findings in urine 01/10/2019   Other disorders of bilirubin metabolism 01/10/2019   Other long term (current) drug therapy 01/10/2019   Generalized (acute) peritonitis ([place]) 01/06/2019   Hypoparathyroidism, unspecified ([place]) 07/30/2016   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   Disorders of magnesium metabolism, unspecified 07/05/2016   Disorder of phosphorus metabolism, unspecified 07/05/2016   Iron deficiency anemia, unspecified 07/05/2016   Nutritional deficiency, unspecified 07/05/2016   Occupational exposure to other risk factors 07/05/2016   Other disorders of electrolyte and fluid balance, not elsewhere classified 07/05/2016   Other disorders resulting from impaired renal tubular function 07/05/2016   Secondary hyperparathyroidism of renal origin ([place]) 07/05/2016   Deficiency of other specified B group vitamins 07/05/2016    Orientation RESPIRATION BLADDER Height & Weight     Self, Time, Situation, Place  Normal Continent Weight: 150 lb 12.7 oz (68.4 kg) Height:  5\' 11"  (180.3 cm)  BEHAVIORAL SYMPTOMS/MOOD NEUROLOGICAL BOWEL NUTRITION STATUS      Incontinent Diet (renal, carb modified with fluid restrictions.  See discharge summary)  AMBULATORY STATUS COMMUNICATION OF NEEDS Skin   Limited Assist Verbally Normal                       Personal Care Assistance Level of Assistance  Bathing, Feeding, Dressing Bathing Assistance: Limited assistance Feeding assistance:  Limited assistance Dressing Assistance: Maximum assistance     Functional Limitations Info  Sight, Hearing, Speech Sight Info: Adequate Hearing Info: Adequate Speech Info: Adequate    SPECIAL CARE FACTORS FREQUENCY  PT (By licensed  PT), OT (By licensed OT), Speech therapy     PT Frequency: 5x week OT Frequency: 5x week     Speech Therapy Frequency: 3x week      Contractures Contractures Info: Not present    Additional Factors Info  Code Status, Allergies, Insulin Sliding Scale Code Status Info: full Allergies Info: lactose intolerant   Insulin Sliding Scale Info: novolog, 0-6 units, 3x day with meals       Current Medications (10/07/2019):  This is the current hospital active medication list Current Facility-Administered Medications  Medication Dose Route Frequency Provider Last Rate Last Admin   acetaminophen (TYLENOL) tablet 650 mg  650 mg Oral Q4H PRN Amie Portland, MD       Or   acetaminophen (TYLENOL) 160 MG/5ML solution 650 mg  650 mg Per Tube Q4H PRN Amie Portland, MD       Or   acetaminophen (TYLENOL) suppository 650 mg  650 mg Rectal Q4H PRN Amie Portland, MD       amLODipine (NORVASC) tablet 10 mg  10 mg Oral Daily Garvin Fila, MD   10 mg at 10/07/19 0849   atorvastatin (LIPITOR) tablet 20 mg  20 mg Oral QHS Garvin Fila, MD   20 mg at 10/06/19 2248   calcitRIOL (ROCALTROL) capsule 1 mcg  1 mcg Oral Q T,Th,Sa-HD Alric Seton, PA-C   1 mcg at 10/06/19 1204   carvedilol (COREG) tablet 12.5 mg  12.5 mg Oral BID WC Garvin Fila, MD   12.5 mg at 10/07/19 0848   Chlorhexidine Gluconate Cloth 2 % PADS 6 each  6 each Topical Q0600 Claudia Desanctis, MD   6 each at 10/07/19 0559   ferric gluconate (NULECIT) 125 mg in sodium chloride 0.9 % 100 mL IVPB  125 mg Intravenous Q T,Th,Sa-HD Alric Seton, PA-C       furosemide (LASIX) tablet 20 mg  20 mg Oral Daily Garvin Fila, MD   20 mg at 10/07/19 0849   gabapentin (NEURONTIN) capsule 300 mg  300 mg Oral Daily Garvin Fila, MD   300 mg at 10/07/19 0849   influenza vac split quadrivalent PF (FLUARIX) injection 0.5 mL  0.5 mL Intramuscular Prior to discharge Garvin Fila, MD       insulin aspart (novoLOG) injection 0-6 Units  0-6 Units  Subcutaneous TID WC Amie Portland, MD   2 Units at 10/05/19 1231   insulin aspart (novoLOG) injection 3 Units  3 Units Subcutaneous TID WC Garvin Fila, MD   3 Units at 10/07/19 0848   insulin glargine (LANTUS) injection 10 Units  10 Units Subcutaneous Daily Garvin Fila, MD   10 Units at 10/06/19 2249   labetalol (NORMODYNE) injection 20 mg  20 mg Intravenous Q2H PRN Garvin Fila, MD       multivitamin (RENA-VIT) tablet 1 tablet  1 tablet Oral QHS Alric Seton, PA-C   1 tablet at 10/06/19 2248   pantoprazole (PROTONIX) EC tablet 40 mg  40 mg Oral QHS Garvin Fila, MD   40 mg at 10/06/19 2248   pneumococcal 23 valent vaccine (PNEUMOVAX-23) injection 0.5 mL  0.5 mL Intramuscular Prior to discharge Garvin Fila, MD       senna-docusate (Senokot-S) tablet  1 tablet  1 tablet Oral BID Amie Portland, MD   1 tablet at 10/05/19 0722   sevelamer carbonate (RENVELA) tablet 1,600 mg  1,600 mg Oral TID WC Claudia Desanctis, MD   1,600 mg at 10/07/19 1245   vancomycin (VANCOCIN) 50 mg/mL oral solution 125 mg  125 mg Oral QID Donzetta Starch, NP   125 mg at 10/07/19 1245   Vitamin D (Ergocalciferol) (DRISDOL) capsule 50,000 Units  50,000 Units Oral Q Sat Garvin Fila, MD   50,000 Units at 10/05/19 0143     Discharge Medications: Please see discharge summary for a list of discharge medications.  Relevant Imaging Results:  Relevant Lab Results:   Additional Information SSN [id number]  Joanne Chars, LCSW  I have personally obtained history,examined this patient, reviewed notes, independently viewed imaging studies, participated in medical decision making and plan of care.ROS completed by me personally and pertinent positives fully documented  I have made any additions or clarifications directly to the above note. Agree with note above.    Antony Contras, MD Medical Director [facility] Pager: [phone] 10/07/2019 5:33 PM

## 2019-10-07 NOTE — Progress Notes (Signed)
Occupational Therapy Evaluation Patient Details Name: Jorge [name] MRN: [id number] DOB: 08-24-1970 Today's Date: 10/07/2019    History of Present Illness 49 y.o. 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 Stat CT performed, consistent with 15 cc hematoma in the right putamen and adjacent white matter with a small volume subarachnoid extension locally.  Small remote left frontal cortex insult and small remote left cerebellar infarct.   Clinical Impression   Patient pleasant and cooperative, eager to participate in OT/PT session.  Completing LB dressing today (initated from bed level, completed in standing) with min assist +2 to don brief and B prosthetics, requiring increased time and min assist +2 in standing for clothing mgmt.  Patient completing grooming tasks at sink with min assist for balance and sequencing of hand washing task, requiring cueing to attend to task/complete all steps.  Continues to presents with L sided inattention and visual field cut, able to scan to locate items while seated given increased time but continues to require cueing for navigation and obstacle avoidance with mobility. Will follow acutely. CIR remains appropriate at this time, pt highly motivated.     Follow Up Recommendations  CIR;Supervision/Assistance - 24 hour    Equipment Recommendations  None recommended by OT    Recommendations for Other Services Rehab consult     Precautions / Restrictions Precautions Precautions: Fall Precaution Comments: bilateral BKA Restrictions Weight Bearing Restrictions: No      Mobility Bed Mobility Overal bed mobility: Needs Assistance Bed Mobility: Supine to Sit     Supine to sit: Min guard     General bed mobility comments: for safety    Transfers Overall transfer level: Needs assistance Equipment used: Rolling walker (2 wheeled) Transfers: Sit to/from Stand Sit to Stand: Min assist;+2 physical assistance;+2 safety/equipment         General transfer comment: cueing for hand placement, safety, inital standing balance and RW mgmt     Balance Overall balance assessment: Needs assistance Sitting-balance support: No upper extremity supported;Feet supported Sitting balance-Leahy Scale: Fair Sitting balance - Comments: close supervision dynamically   Standing balance support: Bilateral upper extremity supported;During functional activity;No upper extremity supported Standing balance-Leahy Scale: Poor Standing balance comment: relies on external support, when grooming leaning forward at sink                            ADL either performed or assessed with clinical judgement   ADL Overall ADL's : Needs assistance/impaired     Grooming: Minimal assistance;Standing Grooming Details (indicate cue type and reason): min assist to sequence through hand washing tasks and to maintain balance standing at sink              Lower Body Dressing: Bed level;Sit to/from stand;Minimal assistance Lower Body Dressing Details (indicate cue type and reason): pt donning brief supine over LEs, sit to stand requires assist to pull up over hips; increased time to don B prosthetic with min assist  Toilet Transfer: Minimal assistance;+2 for safety/equipment;Ambulation;+2 for physical assistance;RW Toilet Transfer Details (indicate cue type and reason): for navigation of obstacles and managing L environment safely         Functional mobility during ADLs: Minimal assistance;+2 for physical assistance;+2 for safety/equipment;Rolling walker;Cueing for sequencing;Cueing for safety General ADL Comments: limited by  L sided visual deficits, inattention, balance and safety      Vision   Additional Comments: L visual field cut,  improving awareness but continues to require min cueing for visual scanning/navigation when ambulating      Perception     Praxis      Pertinent Vitals/Pain Pain Assessment: No/denies pain     Hand Dominance     Extremity/Trunk Assessment             Communication     Cognition Arousal/Alertness: Awake/alert Behavior During Therapy: WFL for tasks assessed/performed Overall Cognitive Status: Impaired/Different from baseline Area of Impairment: Safety/judgement;Awareness;Problem solving;Attention;Following commands                   Current Attention Level: Selective   Following Commands: Follows multi-step commands inconsistently;Follows one step commands consistently;Follows one step commands with increased time Safety/Judgement: Decreased awareness of safety;Decreased awareness of deficits Awareness: Emergent Problem Solving: Difficulty sequencing;Requires verbal cues General Comments: patient requires cueing for sequencing grooming while standing at sink, L inattention and visual field cut    General Comments       Exercises     Shoulder Instructions      Home Living                                          Prior Functioning/Environment                   OT Problem List:        OT Treatment/Interventions:      OT Goals(Current goals can be found in the care plan section) Acute Rehab OT Goals Patient Stated Goal: To return to independence OT Goal Formulation: With patient  OT Frequency: Min 2X/week   Barriers to D/C:            Co-evaluation              AM-PAC OT "6 Clicks" Daily Activity     Outcome Measure Help from another person eating meals?: A Little Help from another person taking care of personal grooming?: A Little Help from another person toileting, which includes using toliet, bedpan, or urinal?: A Lot Help from another person bathing (including washing, rinsing, drying)?: A Lot Help from another  person to put on and taking off regular upper body clothing?: A Little Help from another person to put on and taking off regular lower body clothing?: A Little 6 Click Score: 16   End of Session Equipment Utilized During Treatment: Rolling walker Nurse Communication: Mobility status  Activity Tolerance: Patient tolerated treatment well Patient left: in chair;with call bell/phone within reach;with chair alarm set  OT Visit Diagnosis: Unsteadiness on feet (R26.81);Other abnormalities of gait and mobility (R26.89);Muscle weakness (generalized) (M62.81);Other symptoms and signs involving the nervous system (R29.898);Other symptoms and signs involving cognitive function;Hemiplegia and hemiparesis Hemiplegia - Right/Left: Left Hemiplegia - dominant/non-dominant: Dominant Hemiplegia - caused by: Nontraumatic intracerebral hemorrhage                Time: 0626-9485 OT Time Calculation (min): 25 min Charges:  OT General Charges $OT Visit: 1 Visit OT Treatments $Self Care/Home Management : 8-22 mins  Jolaine Artist, [place] Pager [phone] Office [phone]   Delight Stare 10/07/2019, 12:45 PM

## 2019-10-07 NOTE — Plan of Care (Signed)
°  Problem: Self-Care: Goal: Ability to communicate needs accurately will improve Outcome: Progressing   Problem: Nutrition: Goal: Risk of aspiration will decrease Outcome: Progressing   Problem: Nutrition: Goal: Dietary intake will improve Outcome: Progressing   Problem: Intracerebral Hemorrhage Tissue Perfusion: Goal: Complications of Intracerebral Hemorrhage will be minimized Outcome: Progressing

## 2019-10-08 DIAGNOSIS — I610 Nontraumatic intracerebral hemorrhage in hemisphere, subcortical: Secondary | ICD-10-CM | POA: Diagnosis not present

## 2019-10-08 DIAGNOSIS — N186 End stage renal disease: Secondary | ICD-10-CM | POA: Diagnosis not present

## 2019-10-08 DIAGNOSIS — E1122 Type 2 diabetes mellitus with diabetic chronic kidney disease: Secondary | ICD-10-CM | POA: Diagnosis not present

## 2019-10-08 DIAGNOSIS — Z992 Dependence on renal dialysis: Secondary | ICD-10-CM | POA: Diagnosis not present

## 2019-10-08 LAB — RENAL FUNCTION PANEL
Albumin: 2.8 g/dL — ABNORMAL LOW (ref 3.5–5.0)
Anion gap: 12 (ref 5–15)
BUN: 51 mg/dL — ABNORMAL HIGH (ref 6–20)
CO2: 20 mmol/L — ABNORMAL LOW (ref 22–32)
Calcium: 8.6 mg/dL — ABNORMAL LOW (ref 8.9–10.3)
Chloride: 102 mmol/L (ref 98–111)
Creatinine, Ser: 7.38 mg/dL — ABNORMAL HIGH (ref 0.61–1.24)
GFR calc Af Amer: 9 mL/min — ABNORMAL LOW (ref >60)
GFR calc non Af Amer: 8 mL/min — ABNORMAL LOW (ref >60)
Glucose, Bld: 91 mg/dL (ref 70–99)
Phosphorus: 4.1 mg/dL (ref 2.5–4.6)
Potassium: 5.1 mmol/L (ref 3.5–5.1)
Sodium: 134 mmol/L — ABNORMAL LOW (ref 135–145)

## 2019-10-08 LAB — SARS CORONAVIRUS 2 BY RT PCR (HOSPITAL ORDER, PERFORMED IN ~~LOC~~ HOSPITAL LAB): SARS Coronavirus 2: NEGATIVE

## 2019-10-08 LAB — GLUCOSE, CAPILLARY
Glucose-Capillary: 80 mg/dL (ref 70–99)
Glucose-Capillary: 76 mg/dL (ref 70–99)
Glucose-Capillary: 122 mg/dL — ABNORMAL HIGH (ref 70–99)
Glucose-Capillary: 153 mg/dL — ABNORMAL HIGH (ref 70–99)

## 2019-10-08 LAB — CBC
HCT: 28.5 % — ABNORMAL LOW (ref 39.0–52.0)
Hemoglobin: 8.5 g/dL — ABNORMAL LOW (ref 13.0–17.0)
MCH: 29.4 pg (ref 26.0–34.0)
MCHC: 29.8 g/dL — ABNORMAL LOW (ref 30.0–36.0)
MCV: 98.6 fL (ref 80.0–100.0)
Platelets: 127 10*3/uL — ABNORMAL LOW (ref 150–400)
RBC: 2.89 MIL/uL — ABNORMAL LOW (ref 4.22–5.81)
RDW: 15.7 % — ABNORMAL HIGH (ref 11.5–15.5)
WBC: 6.6 10*3/uL (ref 4.0–10.5)
nRBC: 0 % (ref 0.0–0.2)

## 2019-10-08 MED ORDER — HEPARIN SODIUM (PORCINE) 1000 UNIT/ML IJ SOLN
INTRAMUSCULAR | Status: AC
  Filled 2019-10-08: qty 4

## 2019-10-08 MED ORDER — CALCITRIOL 0.5 MCG PO CAPS
ORAL_CAPSULE | ORAL | Status: AC
  Filled 2019-10-08: qty 2

## 2019-10-08 NOTE — Progress Notes (Signed)
STROKE TEAM PROGRESS NOTE   INTERVAL HISTORY Patient has just returned from dialysis.  Is doing fine.  He has no complaints.  Vital signs stable.  Is medically stable to go to rehab and skilled nursing facility when bed available.  Neurological exam is unchanged.    OBJECTIVE Vitals:   10/06/19 2312 10/07/19 0823 10/07/19 1642 10/07/19 2200  BP: (!) 142/78 130/62 117/83 (!) 102/54  Pulse: 73 63 67 66  Resp: 15 16  16   Temp: 98 F (36.7 C) 98.3 F (36.8 C)  98.2 F (36.8 C)  TempSrc: Oral   Oral  SpO2: 100% 100%  94%  Weight:      Height:       CBC:  Recent Labs  Lab 10/03/19 0804 10/03/19 0812 10/05/19 0231 10/06/19 0803  WBC 7.6   < > 7.4 5.9  NEUTROABS 5.0  --   --   --   HGB 9.7*   < > 8.7* 8.5*  HCT 33.6*   < > 27.7* 28.9*  MCV 99.7   < > 95.5 100.0  PLT 94*   < > 104* 102*   < > = values in this interval not displayed.   Basic Metabolic Panel:  Recent Labs  Lab 10/05/19 0231 10/06/19 0803  NA 134* 136  K 3.5 4.4  CL 97* 102  CO2 27 22  GLUCOSE 156* 132*  BUN 17 41*  CREATININE 5.12* 7.70*  CALCIUM 7.9* 8.4*  PHOS 2.8 4.1    IMAGING past 24h No results found.  PHYSICAL EXAM    Pleasant middle-age African-American male not in distress.  He has bilateral below-knee amputations. Afebrile. Head is nontraumatic. Neck is supple without bruit.    Cardiac exam no murmur or gallop. Lungs are clear to auscultation. Distal pulses are well felt. Neurological Exam :  He is awake and interactive.  Oriented to time place and person.  Extraocular movements are full range on nystagmus.  Mild dysarthria no aphasia.  Left lower facial weakness.  Tongue midline.  Motor system exam shows mild left upper and lower extremity drift with 4/5 strength in the left upper extremity proximally and distally.  And 4+/5 strength in the left lower extremity at the thighs.  Cannot test distally in lower extremities due to bilateral amputation.  Sensation appears preserved bilaterally.   Plantars are downgoing.  Gait not tested.   ASSESSMENT/PLAN [name] is a 49 y.o. male with history of diabetes with gastroparesis, decreased vision left eye, hypertension, BKA, ESRD on peritoneal dialysis, presented to the emergency room with sudden onset of left-sided weakness, confusion, slurred speech and headache. Blood sugars in the 76H, systolic blood pressure 607P. Stat CT c/w 15 cc hematoma in the right putamen and adjacent white matter with a small volume subarachnoid extension locally. Small remote left frontal cortex insult and small remote left cerebellar infarct. He did not receive IV t-PA due to [place].  Stroke: R putamen and adjacent white matter ICH w/ IVH & SAH   Resultant mild left hemiparesis  Code Stroke CT Head - 15 cc acute hematoma at the right putamen and adjacent white matter. Small volume subarachnoid extension seen locally. Small remote left frontal cortex insult, usually infarct. Small remote left cerebellar infarct.   CT head - stable R putamen/external capsule IPH measuring 3.5 cm. New IVH R lateral ventricle. SAH L insula more defined  [employer] Virus 2  - negative  LDL - 54  HgbA1c - 4.9  VTE  prophylaxis - SCDs    aspirin 81 mg daily prior to admission, now on No antithrombotic given hemorrhage   Therapy recommendations:  CIR vs SNF   Disposition:  Pending  Hypertension  Home BP meds: amlodipine ; Coreg ; catapres  Current BP meds: amlodipine 10, coreg 12.5 bid, lasix 20, prn Labetalol   . BP up to 190s last pm w/o prns administered . SBP goal < 140 mm Hg initially->now < 160 . Long-term BP goal normotensive  Hyperlipidemia  Home Lipid lowering medication: Lipitor 20 mg daily  LDL 54, goal < 70  Current lipid lowering medication:  None (statin contraindicated with ICH)  Continue statin at discharge  Diabetes  Home diabetic meds: insulin  Current diabetic meds: lantus 10, SSI 0-6  HgbA1c 4.9, goal < 7.0 Recent Labs     10/07/19 1151 10/07/19 1640 10/07/19 2126  GLUCAP 82 162* 97    Other Stroke Risk Factors  Former cigarette smoker - quit  Previous ETOH use  Family hx stroke (brother)   Hx stroke/TIA by imaging  Previous substance abuse - cocaine and marijuana   Other Active Problems Code status - Full code ESRD - HD TTS - creatinine - 7.59->8.10->5.12->7.7 Anemia of chronic kidney disease  - Hgb - 9.7->10.9->8.7->8.5 Thrombocytopenia - platelets - 94->104->102  Hyponatremia - Na - 136 - resolved Metabolic bone disease Large loose watery stools. C diff positive. Added PO Vanc per protocol 9/28. Possible [place] Trial candidate  Hospital day # 5  Discussed with case manager.  Patient medically stable to be transferred to rehabilitation and skilled nursing facility when bed available.  Will order Covid test today for placement.  Discussed with patient and daughter over the phone.  Greater than 50% time during this 25-minute visit was spent in counseling and coordination of care about his intracerebral hemorrhage discussion about rehabilitation plan and answering questions Antony Contras, MD    To contact Stroke Continuity provider, please refer to [email or web address] After hours, contact General Neurology

## 2019-10-08 NOTE — Progress Notes (Signed)
  Speech Language Pathology Treatment: Cognitive-Linquistic  Patient Details Name: Jorge [name] MRN: [id number] DOB: 02-10-1970 Today's Date: 10/08/2019 Time: 9563-8756 SLP Time Calculation (min) (ACUTE ONLY): 20 min  Assessment / Plan / Recommendation Clinical Impression  Pt seen for skilled treatment for cognition; specifically attention/awareness and organization.  Pt able to recall words in divergent/convergent tasks with 80% accuracy given familiar category and min verbal/semantic cues provided by SLP; discussed recent coughing with liquids per pt report; impulsivity affecting swallowing liquids with straw/cup intake; pt/daughter educated re: swallowing safety/cognition re: smaller sips during intake with coughing eliminated using this strategy.  Dysarthria improved as pt intelligible 85% of session with one incident of repetition required during complex conversation.  Recommend d/c ST services in acute setting and f/u at next venue of care for swallowing safety/cognitive deficits.     HPI HPI: 49 y.o. male past medical history of diabetes with gastroparesis, hypertension, BKA, ESRD on peritoneal dialysis, with no prior medical history of strokes, presented to the emergency room for sudden onset of left-sided weakness and confusion.      SLP Plan  Other (Comment) (ST f/u at next venue of care; will s/o in acute setting)       Recommendations   f/u at SNF for cognitive deficits                Follow up Recommendations: Skilled Nursing facility (per pt report) SLP Visit Diagnosis: Dysarthria and anarthria (R47.1);Cognitive communication deficit (R41.841) Plan: Other (Comment) (ST f/u at next venue of care; will s/o in acute setting)                       Elvina Sidle, M.S., [place] 10/08/2019, 2:26 PM

## 2019-10-08 NOTE — Progress Notes (Signed)
Small, redended, open area on scrotum noted while performing peri care.

## 2019-10-08 NOTE — TOC Progression Note (Signed)
Transition of Care [facility]) - Progression Note    Patient Details  Name: JODI KAPPES MRN: [id number] Date of Birth: Dec 04, 1970  Transition of Care [facility]) CM/SW Contact  Joanne Chars, [place] Phone Number: 10/08/2019, 8:30 AM  Clinical Narrative:   Annia Friendly #[id number]    Expected Discharge Plan: Skilled Nursing Facility Barriers to Discharge: Continued Medical Work up, SNF Pending bed offer  Expected Discharge Plan and Services Expected Discharge Plan: [place] Choice: [place] arrangements for the past 2 months: Single Family Home                                       Social Determinants of Health (SDOH) Interventions    Readmission Risk Interventions No flowsheet data found.

## 2019-10-08 NOTE — Procedures (Signed)
Seen and examined on dialysis.  Blood pressure 125/63 and HR 68.  Tolerating goal.  On 2K bath. LIJ tunn catheter BF 400 and R AVF b/t.  Hasn't heard about rehab yet.   Claudia Desanctis, MD 10/08/2019 8:22 AM

## 2019-10-08 NOTE — Progress Notes (Signed)
[facility] Progress Note   Subjective:   Patient seen and examined at bedside in dialysis.  Tolerating dialysis well.  He reports feeling much better.  Denies CP, SOB, edema, orthopnea, n/v, cramping and dizziness.  Says diarrhea is slightly improved.  Still waiting to hear about rehab.  Objective Vitals:   10/08/19 0800 10/08/19 0813 10/08/19 0828 10/08/19 0843  BP: 113/67 125/63 119/60 134/66  Pulse: 82  85   Resp:  20 (!) 24 14  Temp:      TempSrc:      SpO2:      Weight:      Height:       Physical Exam General:chronically ill appearing male in NAD, AAOx3 Heart:RRR Lungs:CTAB anteriolaterally Abdomen:soft, NTND Extremities:b/l BKA, no stump edema Dialysis Access: TDC in use, RU AVF +b/t  Filed Weights   10/06/19 0740 10/06/19 1050 10/08/19 0723  Weight: 68.9 kg 68.4 kg 71.3 kg    Intake/Output Summary (Last 24 hours) at 10/08/2019 0854 Last data filed at 10/07/2019 2000 Gross per 24 hour  Intake 120 ml  Output 0 ml  Net 120 ml    Additional Objective Labs: Basic Metabolic Panel: Recent Labs  Lab 10/05/19 0231 10/06/19 0803 10/08/19 0551  NA 134* 136 134*  K 3.5 4.4 5.1  CL 97* 102 102  CO2 27 22 20*  GLUCOSE 156* 132* 91  BUN 17 41* 51*  CREATININE 5.12* 7.70* 7.38*  CALCIUM 7.9* 8.4* 8.6*  PHOS 2.8 4.1 4.1   Liver Function Tests: Recent Labs  Lab 10/03/19 0804 10/03/19 0804 10/05/19 0231 10/06/19 0803 10/08/19 0551  AST 24  --   --   --   --   ALT 14  --   --   --   --   ALKPHOS 93  --   --   --   --   BILITOT 0.8  --   --   --   --   PROT 7.0  --   --   --   --   ALBUMIN 3.3*   < > 2.7* 2.7* 2.8*   < > = values in this interval not displayed.   CBC: Recent Labs  Lab 10/03/19 0804 10/03/19 0812 10/05/19 0231 10/06/19 0803 10/08/19 0825  WBC 7.6   < > 7.4 5.9 6.6  NEUTROABS 5.0  --   --   --   --   HGB 9.7*   < > 8.7* 8.5* 8.5*  HCT 33.6*   < > 27.7* 28.9* 28.5*  MCV 99.7  --  95.5 100.0 98.6  PLT 94*   < > 104*  102* 127*   < > = values in this interval not displayed.   CBG: Recent Labs  Lab 10/07/19 0825 10/07/19 1151 10/07/19 1640 10/07/19 2126 10/08/19 0635  GLUCAP 104* 82 162* 97 80    Medications: . ferric gluconate (FERRLECIT/NULECIT) IV     . amLODipine  10 mg Oral Daily  . atorvastatin  20 mg Oral QHS  . calcitRIOL  1 mcg Oral Q T,Th,Sa-HD  . carvedilol  12.5 mg Oral BID WC  . Chlorhexidine Gluconate Cloth  6 each Topical Q0600  . furosemide  20 mg Oral Daily  . gabapentin  300 mg Oral Daily  . insulin aspart  0-6 Units Subcutaneous TID WC  . insulin aspart  3 Units Subcutaneous TID WC  . insulin glargine  10 Units Subcutaneous Daily  . multivitamin  1 tablet Oral QHS  .  pantoprazole  40 mg Oral QHS  . senna-docusate  1 tablet Oral BID  . sevelamer carbonate  1,600 mg Oral TID WC  . vancomycin  125 mg Oral QID  . Vitamin D (Ergocalciferol)  50,000 Units Oral Q Sat    Dialysis Orders: [facility] TTS 4h 68kg 2/2.5 bath Hep 4200 (holding bolus heparin for now / ok for heparin block to [facility]) R BVT AVF (08/28/19, Dr Donnetta Hutching, maturing)  + L IJ TDC - mircera 200 q 2 last 9/16 - venofer 100 x 3 more doses - calcitriol 1 ug tiw Recent labs: hgb 9 trending up tsat 18%  Assessment/Plan: 1. Hemorrhagic R brain CVA - per Neurology.  NO heparin with HD. Ok to use to lock [facility].   2. ESRD - on HD TTS.  HD today per regular schedule.  K 5.1 this AM.  3. Anemia of CKD- Hgb 8.5.  Last tsat 9%, continue iron load 4. Secondary hyperparathyroidism - Ca and phos in goal.  Continue binders and VDRA - renvela decreased to 2AC TID due to low phos.  5. HTN/volume - s/p IV cleviprex.  Per primary/stroke team in setting of CVA.  Initial goal to keep SBP<140, now<160 On amlodipine 10mg , coreg 12.5 BID, lasix 20mg  and prn labetalol 6. Nutrition - Renal diet w/fluid restrictions 7. C diff colitis - per primary, on PO vanc 8. DMT2 - per primary. On insulin 9. [name] - CIR>SNF.   Jen Mow, PA-C [facility] 10/08/2019,8:54 AM  LOS: 5 days

## 2019-10-08 NOTE — Progress Notes (Signed)
Physical Therapy Treatment Patient Details Name: Jorge [name] MRN: [id number] DOB: [birth year] Today's Date: 10/08/2019    History of Present Illness 49 y.o. 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 Stat CT performed, consistent with 15 cc hematoma in the right putamen and adjacent white matter with a small volume subarachnoid extension locally.  Small remote left frontal cortex insult and small remote left cerebellar infarct.    PT Comments    Pt fatigued after dialysis today, but agreeable to gait training. Pt ambulated hallway distance with use of RW, min assist to steady, and max cuing for safe use of RW and avoiding L obstacles in hallway. Pt states he is starting to feel stronger, and is encouraged by progress. SNF remains appropriate d/c plan at this time, will continue to follow acutely.     Follow Up Recommendations  SNF     Equipment Recommendations  Other (comment) (TBD)    Recommendations for Other Services       Precautions / Restrictions Precautions Precautions: Fall Precaution Comments: bilateral BKA Restrictions Weight Bearing Restrictions: No    Mobility  Bed Mobility Overal bed mobility: Needs Assistance             General bed mobility comments: up in recliner  Transfers Overall transfer level: Needs assistance Equipment used: Rolling walker (2 wheeled) Transfers: Sit to/from Stand Sit to Stand: Min assist         General transfer comment: min assist to steady, good hand placement when rising/sitting.  Ambulation/Gait Ambulation/Gait assistance: Min assist Gait Distance (Feet): 135 Feet Assistive device: Rolling walker (2 wheeled) Gait Pattern/deviations: Step-through pattern;Decreased stride length;Trunk  flexed;Drifts right/left Gait velocity: decr   General Gait Details: Min assist to steady, guide RW but less than 9/29 treat. Verbal cuing for placement within and centered in RW, upright posture, keeping RW close to body, avoiding obstacles on L x3 during gait.   Stairs             Wheelchair Mobility    Modified Rankin (Stroke Patients Only) Modified Rankin (Stroke Patients Only) Pre-Morbid Rankin Score: Moderate disability Modified Rankin: Moderately severe disability     Balance Overall balance assessment: Needs assistance Sitting-balance support: No upper extremity supported;Feet supported Sitting balance-Leahy Scale: Fair Sitting balance - Comments: close supervision dynamically   Standing balance support: Bilateral upper extremity supported;During functional activity;No upper extremity supported Standing balance-Leahy Scale: Poor Standing balance comment: reliant on external support                            Cognition Arousal/Alertness: Awake/alert Behavior During Therapy: WFL for tasks assessed/performed Overall Cognitive Status: Impaired/Different from baseline Area of Impairment: Safety/judgement;Awareness;Problem solving;Attention                   Current Attention Level: Selective     Safety/Judgement: Decreased awareness of safety;Decreased awareness of deficits Awareness: Emergent Problem Solving: Difficulty sequencing;Requires verbal cues General Comments: L inattention and L visual field cut, requires cuing for safety with RW throughout      Exercises      General Comments        Pertinent Vitals/Pain Pain Assessment: No/denies pain Faces Pain Scale: No hurt Pain Intervention(s): Limited activity within patient's tolerance;Monitored during session;Repositioned    Home  Living                      Prior Function            PT Goals (current goals can now be found in the care plan section) Acute Rehab PT  Goals Patient Stated Goal: To return to independence PT Goal Formulation: With patient Time For Goal Achievement: 10/18/19 Potential to Achieve Goals: Good Progress towards PT goals: Progressing toward goals    Frequency    Min 3X/week      PT Plan Current plan remains appropriate    Co-evaluation              AM-PAC PT "6 Clicks" Mobility   Outcome Measure  Help needed turning from your back to your side while in a flat bed without using bedrails?: A Little Help needed moving from lying on your back to sitting on the side of a flat bed without using bedrails?: A Little Help needed moving to and from a bed to a chair (including a wheelchair)?: A Little Help needed standing up from a chair using your arms (e.g., wheelchair or bedside chair)?: A Little Help needed to walk in hospital room?: A Lot Help needed climbing 3-5 steps with a railing? : Total 6 Click Score: 15    End of Session   Activity Tolerance: Patient tolerated treatment well;Patient limited by fatigue Patient left: with call bell/phone within reach;in chair;with family/visitor present Nurse Communication: Mobility status PT Visit Diagnosis: Other abnormalities of gait and mobility (R26.89);Muscle weakness (generalized) (M62.81);Other symptoms and signs involving the nervous system (R29.898)     Time: 3159-4585 PT Time Calculation (min) (ACUTE ONLY): 17 min  Charges:  $Gait Training: 8-22 mins                     Burnette Valenti E, PT Acute Rehabilitation Services Pager [phone]  Office [phone]     Lyndon Chapel D Elonda Husky 10/08/2019, 4:26 PM

## 2019-10-08 NOTE — TOC Progression Note (Signed)
Transition of Care [facility]) - Progression Note    Patient Details  Name: Jorge [name] MRN: [id number] Date of Birth: 12/23/1970  Transition of Care [facility]) CM/SW Contact  Joanne Chars, LCSW Phone Number: 10/08/2019, 3:15 PM  Clinical Narrative:   No bed offers.  [facility] reviewing.  Dialysis Tues/Thurs/Sat has been a barrier.    Expected Discharge Plan: [place] Barriers to Discharge: Continued Medical Work up, SNF Pending bed offer  Expected Discharge Plan and Services Expected Discharge Plan: [place] Choice: [place] arrangements for the past 2 months: Single Family Home                                       Social Determinants of Health (SDOH) Interventions    Readmission Risk Interventions No flowsheet data found.

## 2019-10-09 DIAGNOSIS — I610 Nontraumatic intracerebral hemorrhage in hemisphere, subcortical: Secondary | ICD-10-CM | POA: Diagnosis not present

## 2019-10-09 LAB — GLUCOSE, CAPILLARY
Glucose-Capillary: 41 mg/dL — CL (ref 70–99)
Glucose-Capillary: 52 mg/dL — ABNORMAL LOW (ref 70–99)
Glucose-Capillary: 70 mg/dL (ref 70–99)
Glucose-Capillary: 53 mg/dL — ABNORMAL LOW (ref 70–99)
Glucose-Capillary: 107 mg/dL — ABNORMAL HIGH (ref 70–99)
Glucose-Capillary: 109 mg/dL — ABNORMAL HIGH (ref 70–99)
Glucose-Capillary: 217 mg/dL — ABNORMAL HIGH (ref 70–99)

## 2019-10-09 LAB — RENAL FUNCTION PANEL
Albumin: 2.9 g/dL — ABNORMAL LOW (ref 3.5–5.0)
Anion gap: 12 (ref 5–15)
BUN: 35 mg/dL — ABNORMAL HIGH (ref 6–20)
CO2: 24 mmol/L (ref 22–32)
Calcium: 8.8 mg/dL — ABNORMAL LOW (ref 8.9–10.3)
Chloride: 99 mmol/L (ref 98–111)
Creatinine, Ser: 6.18 mg/dL — ABNORMAL HIGH (ref 0.61–1.24)
GFR calc Af Amer: 11 mL/min — ABNORMAL LOW (ref >60)
GFR calc non Af Amer: 10 mL/min — ABNORMAL LOW (ref >60)
Glucose, Bld: 106 mg/dL — ABNORMAL HIGH (ref 70–99)
Phosphorus: 3.8 mg/dL (ref 2.5–4.6)
Potassium: 4.8 mmol/L (ref 3.5–5.1)
Sodium: 135 mmol/L (ref 135–145)

## 2019-10-09 MED ORDER — DARBEPOETIN ALFA 100 MCG/0.5ML IJ SOSY
100.0000 ug | PREFILLED_SYRINGE | INTRAMUSCULAR | Status: DC
  Administered 2019-10-10: 100 ug via INTRAVENOUS
  Filled 2019-10-09: qty 0.5

## 2019-10-09 MED ORDER — CHLORHEXIDINE GLUCONATE CLOTH 2 % EX PADS
6.0000 | MEDICATED_PAD | Freq: Every day | CUTANEOUS | Status: DC
  Administered 2019-10-09 – 2019-10-16 (×7): 6 via TOPICAL

## 2019-10-09 NOTE — Progress Notes (Signed)
Hypoglycemic Event  CBG: 41  Treatment: 8 oz juice/soda  Symptoms: Shaky, lethargic  Follow-up CBG: Time:806 CBG Result 70  Possible Reasons for Event: Inadequate meal intake  Comments/MD notified:Sethi    Aida Puffer

## 2019-10-09 NOTE — Progress Notes (Signed)
Results for [name], [name] (MRN [id number]) as of 10/09/2019 09:40  Ref. Range 10/08/2019 17:15 10/08/2019 20:11 10/09/2019 08:06 10/09/2019 08:26 10/09/2019 08:36  Glucose-Capillary Latest Ref Range: 70 - 99 mg/dL 122 (H) 153 (H) 41 (LL) 52 (L) 70  Noted that patient had a CBG of 41 mg/dl this am.   Recommend decreasing Lantus to 6 units daily if blood sugars continue to be less than 70 mg/dl. May need to titrate dosages as needed.   Harvel Ricks RN BSN CDE Diabetes Coordinator Pager: [phone]  8am-5pm

## 2019-10-09 NOTE — Progress Notes (Signed)
[facility] Progress Note   Subjective:   Patient seen in room. Hypoglycemic event this AM. Reports he is tired because we are waking him up frequently, but no other complaints. Denies SOB, CP, palpitations, abdominal pain, N/V/D.   Objective Vitals:   10/08/19 1217 10/08/19 1714 10/08/19 2010 10/09/19 0911  BP: (!) 146/93 (!) 97/50 (!) 106/51 103/63  Pulse: 74 72 72 80  Resp: 20 20  16   Temp: 98.6 F (37 C) 99 F (37.2 C) 98.5 F (36.9 C) 98.2 F (36.8 C)  TempSrc:      SpO2: 95% 100% 100% 100%  Weight:      Height:       Physical Exam General: Well developed, alert male in NAD Heart: RRR, no murmur, rubs or gallops Lungs: CTA bilaterally without wheezing, rhonchi or rales Abdomen: Soft, non-tender, non-distended, +BS Extremities: b/l BKA, no edema  Dialysis Access: TDC, RUE AVF + t/b  Additional Objective Labs: Basic Metabolic Panel: Recent Labs  Lab 10/05/19 0231 10/06/19 0803 10/08/19 0551  NA 134* 136 134*  K 3.5 4.4 5.1  CL 97* 102 102  CO2 27 22 20*  GLUCOSE 156* 132* 91  BUN 17 41* 51*  CREATININE 5.12* 7.70* 7.38*  CALCIUM 7.9* 8.4* 8.6*  PHOS 2.8 4.1 4.1   Liver Function Tests: Recent Labs  Lab 10/03/19 0804 10/03/19 0804 10/05/19 0231 10/06/19 0803 10/08/19 0551  AST 24  --   --   --   --   ALT 14  --   --   --   --   ALKPHOS 93  --   --   --   --   BILITOT 0.8  --   --   --   --   PROT 7.0  --   --   --   --   ALBUMIN 3.3*   < > 2.7* 2.7* 2.8*   < > = values in this interval not displayed.   No results for input(s): LIPASE, AMYLASE in the last 168 hours. CBC: Recent Labs  Lab 10/03/19 0804 10/03/19 0812 10/05/19 0231 10/06/19 0803 10/08/19 0825  WBC 7.6   < > 7.4 5.9 6.6  NEUTROABS 5.0  --   --   --   --   HGB 9.7*   < > 8.7* 8.5* 8.5*  HCT 33.6*   < > 27.7* 28.9* 28.5*  MCV 99.7  --  95.5 100.0 98.6  PLT 94*   < > 104* 102* 127*   < > = values in this interval not displayed.   Blood Culture    Component  Value Date/Time   SDES BLOOD SITE NOT SPECIFIED 03/19/2019 2016   SPECREQUEST  03/19/2019 2016    BOTTLES DRAWN AEROBIC AND ANAEROBIC Blood Culture adequate volume   CULT  03/19/2019 2016    NO GROWTH 5 DAYS Performed at [facility], [place] [address], [place], [place] [address]    REPTSTATUS 03/24/2019 FINAL 03/19/2019 2016   CBG: Recent Labs  Lab 10/08/19 1715 10/08/19 2011 10/09/19 0806 10/09/19 0826 10/09/19 0836  GLUCAP 122* 153* 41* 52* 70   Medications: . ferric gluconate (FERRLECIT/NULECIT) IV 125 mg (10/08/19 0943)   . amLODipine  10 mg Oral Daily  . atorvastatin  20 mg Oral QHS  . calcitRIOL  1 mcg Oral Q T,Th,Sa-HD  . carvedilol  12.5 mg Oral BID WC  . Chlorhexidine Gluconate Cloth  6 each Topical Q0600  . furosemide  20 mg  Oral Daily  . gabapentin  300 mg Oral Daily  . insulin aspart  0-6 Units Subcutaneous TID WC  . insulin aspart  3 Units Subcutaneous TID WC  . insulin glargine  10 Units Subcutaneous Daily  . multivitamin  1 tablet Oral QHS  . pantoprazole  40 mg Oral QHS  . senna-docusate  1 tablet Oral BID  . sevelamer carbonate  1,600 mg Oral TID WC  . vancomycin  125 mg Oral QID  . Vitamin D (Ergocalciferol)  50,000 Units Oral Q Sat    Dialysis Orders: [facility] TTS 4h 68kg 2/2.5 bath Hep 4200 (holding bolus heparin for now / ok for heparin block to [facility]) R BVT AVF (08/28/19, Dr Donnetta Hutching, maturing) + L IJ TDC - mircera 200 q 2 last 9/16 - venofer 100 x 3 more doses - calcitriol 1 ug tiw Recent labs: hgb 9 trending up tsat 18%   Assessment/Plan: 1. Hemorrhagic R brain CVA - per Neurology.  NO heparin with HD. Ok to use to lock [facility].   2. ESRD - on HD TTS.  HD tomorrow per regular schedule.  Using [facility] with AVF maturing 3. Anemia of CKD- Hgb 8.5.  Last tsat 9%, continue iron load. I will resume ESA with dialysis tomorrow.  4. Secondary hyperparathyroidism - Ca and phos in goal.  Continue binders and VDRA - renvela decreased to 2AC TID due  to low phos.  5. HTN/volume - s/p IV cleviprex.  Per primary/stroke team in setting of CVA.  Initial goal to keep SBP<140, now<160 On amlodipine 10mg , coreg 12.5 BID, lasix 20mg  and prn labetalol 6. Nutrition - Renal diet w/fluid restrictions 7. C diff colitis - per primary, on PO vanc 8. DMT2 - per primary. On insulin 9. [name] - CIR>SNF.   Anice Paganini, PA-C 10/09/2019, 9:49 AM  [facility] Pager: [phone]

## 2019-10-09 NOTE — Progress Notes (Signed)
Hypoglycemic Event  CBG: 53  Treatment: 8 oz Ginger Ale  Symptoms: Shaky, lethargic  Follow-up CBG: Time:1217      CBG Result 107  Possible Reasons for Event: Inadequate meal intake  Comments/MD notified:Sethi    Aida Puffer

## 2019-10-09 NOTE — Progress Notes (Signed)
Occupational Therapy Treatment Patient Details Name: Jorge [name] MRN: [id number] DOB: 01-18-1970 Today's Date: 10/09/2019    History of present illness 49 y.o. 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 Stat CT performed, consistent with 15 cc hematoma in the right putamen and adjacent white matter with a small volume subarachnoid extension locally.  Small remote left frontal cortex insult and small remote left cerebellar infarct.   OT comments  Pt. Seen for skilled OT treatment session.  Reports being very sleepy from a "busy morning that hasn't stopped yet".  Agreeable to repositioning in chair and education on positioning for LUE also.  Mod a for grooming task with cues and assistance to attend to L portion of face.  Will cont. With progression of ADLs next session with cont. Focus on L attention.   Follow Up Recommendations  CIR;Supervision/Assistance - 24 hour    Equipment Recommendations  None recommended by OT    Recommendations for Other Services Rehab consult    Precautions / Restrictions Precautions Precautions: Fall Precaution Comments: bilateral BKA       Mobility Bed Mobility               General bed mobility comments: up in recliner-assisted pt. with repositioning as he was leaning far to the left with UE compromised.  pt. declines wanting back of chair layed back or feet up. but was agreeable to scooting back and shifting weight to allow better positioning of L UE  Transfers                      Balance                                           ADL either performed or assessed with clinical judgement   ADL Overall ADL's : Needs assistance/impaired     Grooming: Moderate assistance;Sitting;With  caregiver independent assisting Grooming Details (indicate cue type and reason): min assist to sequence through washing face.  pt. washed R portion of face then reached and placed washcloth on tray table. cues to wash L side of face. mod hand over hand assistance for completion                               General ADL Comments: limited by L sided visual deficits, inattention, -pt. also very tired affecting participation. eyes closed most of session. max encouragement to engage and participate     Vision       Perception     Praxis      Cognition Arousal/Alertness: Lethargic Behavior During Therapy: [facility] for tasks assessed/performed                                            Exercises Other Exercises Other Exercises: guided B UEs through A/PROM. pt. able to tolerate well and reports no pain.  educated on positioning and  care of LUE as pt. was leaning on it upon arrival.   Shoulder Instructions       General Comments  Pertinent Vitals/ Pain       Pain Assessment: No/denies pain  Home Living                                          Prior Functioning/Environment              Frequency  Min 2X/week        Progress Toward Goals  OT Goals(current goals can now be found in the care plan section)  Progress towards OT goals: Progressing toward goals     Plan Discharge plan remains appropriate;Frequency remains appropriate    Co-evaluation                 AM-PAC OT "6 Clicks" Daily Activity     Outcome Measure   Help from another person eating meals?: A Little Help from another person taking care of personal grooming?: A Little Help from another person toileting, which includes using toliet, bedpan, or urinal?: A Lot Help from another person bathing (including washing, rinsing, drying)?: A Lot Help from another person to put on and taking off regular upper body clothing?: A Little Help from another  person to put on and taking off regular lower body clothing?: A Little 6 Click Score: 16    End of Session    OT Visit Diagnosis: Unsteadiness on feet (R26.81);Other abnormalities of gait and mobility (R26.89);Muscle weakness (generalized) (M62.81);Other symptoms and signs involving the nervous system (R29.898);Other symptoms and signs involving cognitive function;Hemiplegia and hemiparesis Hemiplegia - Right/Left: Left Hemiplegia - dominant/non-dominant: Dominant Hemiplegia - caused by: Nontraumatic intracerebral hemorrhage   Activity Tolerance Patient limited by fatigue   Patient Left in chair;with call bell/phone within reach   Nurse Communication Other (comment) (spoke with RN regarding low blood sugar this am. she states okay to work with him now)        Time: 9767-3419 OT Time Calculation (min): 8 min  Charges: OT General Charges $OT Visit: 1 Visit OT Treatments $Self Care/Home Management : 8-22 mins  Sonia Baller, COTA/L Acute Rehabilitation [phone]   Janice Coffin 10/09/2019, 10:17 AM

## 2019-10-09 NOTE — Progress Notes (Signed)
STROKE TEAM PROGRESS NOTE   INTERVAL HISTORY Patient had an episode of hypoglycemia early this morning.  Is sitting up in bed.  Is comfortable.  He has no complaints.  Neurological exam is unchanged.  Vital signs are stable.  He is still awaiting bed in nursing home and is medically stable to be transported when bed becomes available.    OBJECTIVE Vitals:   10/08/19 1217 10/08/19 1714 10/08/19 2010 10/09/19 0911  BP: (!) 146/93 (!) 97/50 (!) 106/51 103/63  Pulse: 74 72 72 80  Resp: 20 20  16   Temp: 98.6 F (37 C) 99 F (37.2 C) 98.5 F (36.9 C) 98.2 F (36.8 C)  TempSrc:      SpO2: 95% 100% 100% 100%  Weight:      Height:       CBC:  Recent Labs  Lab 10/03/19 0804 10/03/19 0812 10/06/19 0803 10/08/19 0825  WBC 7.6   < > 5.9 6.6  NEUTROABS 5.0  --   --   --   HGB 9.7*   < > 8.5* 8.5*  HCT 33.6*   < > 28.9* 28.5*  MCV 99.7   < > 100.0 98.6  PLT 94*   < > 102* 127*   < > = values in this interval not displayed.   Basic Metabolic Panel:  Recent Labs  Lab 10/06/19 0803 10/08/19 0551  NA 136 134*  K 4.4 5.1  CL 102 102  CO2 22 20*  GLUCOSE 132* 91  BUN 41* 51*  CREATININE 7.70* 7.38*  CALCIUM 8.4* 8.6*  PHOS 4.1 4.1    IMAGING past 24h  No results found.     PHYSICAL EXAM    Pleasant middle-age African-American male not in distress.  He has bilateral below-knee amputations. Afebrile. Head is nontraumatic. Neck is supple without bruit.    Cardiac exam no murmur or gallop. Lungs are clear to auscultation. Distal pulses are well felt. Neurological Exam :  He is awake and interactive.  Oriented to time place and person.  Extraocular movements are full range on nystagmus.  Mild dysarthria no aphasia.  Left lower facial weakness.  Tongue midline.  Motor system exam shows mild left upper and lower extremity drift with 4/5 strength in the left upper extremity proximally and distally.  And 4+/5 strength in the left lower extremity at the thighs.  Cannot test distally in  lower extremities due to bilateral amputation.  Sensation appears preserved bilaterally.  Plantars are downgoing.  Gait not tested.   ASSESSMENT/PLAN Mr. DARVELL MONTEFORTE is a 49 y.o. male with history of diabetes with gastroparesis, decreased vision left eye, hypertension, BKA, ESRD on peritoneal dialysis, presented to the emergency room with sudden onset of left-sided weakness, confusion, slurred speech and headache. Blood sugars in the 34L, systolic blood pressure 937T. Stat CT c/w 15 cc hematoma in the right putamen and adjacent white matter with a small volume subarachnoid extension locally. Small remote left frontal cortex insult and small remote left cerebellar infarct. He did not receive IV t-PA due to Arp.  Stroke: R putamen and adjacent white matter ICH w/ IVH & SAH   Resultant mild left hemiparesis  Code Stroke CT Head - 15 cc acute hematoma at the right putamen and adjacent white matter. Small volume subarachnoid extension seen locally. Small remote left frontal cortex insult, usually infarct. Small remote left cerebellar infarct.   CT head - stable R putamen/external capsule IPH measuring 3.5 cm. New IVH R lateral ventricle. [place]  L insula more defined   Echo - 10/04/19 - EF 60 - 65%  Sars Corona Virus 2  - negative  LDL - 54  HgbA1c - 4.9  VTE prophylaxis - SCDs    aspirin 81 mg daily prior to admission, now on No antithrombotic given hemorrhage   Therapy recommendations:  CIR vs SNF   Disposition:  Pending  Hypertension  Home BP meds: amlodipine ; Coreg ; catapres  Current BP meds: amlodipine 10, coreg 12.5 bid, lasix 20, prn Labetalol   . BP up to 190s last pm w/o prns administered . SBP goal < 140 mm Hg initially->now < 160 . Long-term BP goal normotensive  Hyperlipidemia  Home Lipid lowering medication: Lipitor 20 mg daily  LDL 54, goal < 70  Current lipid lowering medication:  None (statin contraindicated with ICH)  Continue statin at  discharge  Diabetes  Home diabetic meds: insulin  Current diabetic meds: lantus 10, SSI 0-6  HgbA1c 4.9, goal < 7.0 Recent Labs    10/09/19 0806 10/09/19 0826 10/09/19 0836  GLUCAP 41* 52* 70    Other Stroke Risk Factors  Former cigarette smoker - quit  Previous ETOH use  Family hx stroke (brother)   Hx stroke/TIA by imaging  Previous substance abuse - cocaine and marijuana   Other Active Problems Code status - Full code ESRD - HD TTS - creatinine - 7.59->8.10->5.12->7.7->7.38 Anemia of chronic kidney disease  - Hgb - 9.7->10.9->8.7->8.5->8.5 Thrombocytopenia - platelets - 94->104->102->127  (resolved) Hyponatremia - Na - 343->568 Metabolic bone disease Large loose watery stools. C diff positive. Added PO Vanc per protocol 9/28. Possible [employer] Trial candidate Covid - 10/08/19 - negative Hypoglycemia - 41 - 52 - 70 - insulin adjusted Mild hypotension - adjust meds if symptomatic  Hospital day # 6  Patient medically stable for transfer to nursing home when bed available.  Will discuss with case manager. Antony Contras, MD     To contact Stroke Continuity provider, please refer to [email or web address] After hours, contact General Neurology

## 2019-10-10 DIAGNOSIS — A0472 Enterocolitis due to Clostridium difficile, not specified as recurrent: Secondary | ICD-10-CM | POA: Diagnosis not present

## 2019-10-10 DIAGNOSIS — N186 End stage renal disease: Secondary | ICD-10-CM | POA: Diagnosis not present

## 2019-10-10 DIAGNOSIS — E11649 Type 2 diabetes mellitus with hypoglycemia without coma: Secondary | ICD-10-CM | POA: Diagnosis not present

## 2019-10-10 DIAGNOSIS — Z794 Long term (current) use of insulin: Secondary | ICD-10-CM

## 2019-10-10 DIAGNOSIS — Z992 Dependence on renal dialysis: Secondary | ICD-10-CM

## 2019-10-10 DIAGNOSIS — I161 Hypertensive emergency: Secondary | ICD-10-CM

## 2019-10-10 LAB — GLUCOSE, CAPILLARY
Glucose-Capillary: 161 mg/dL — ABNORMAL HIGH (ref 70–99)
Glucose-Capillary: 135 mg/dL — ABNORMAL HIGH (ref 70–99)
Glucose-Capillary: 240 mg/dL — ABNORMAL HIGH (ref 70–99)
Glucose-Capillary: 199 mg/dL — ABNORMAL HIGH (ref 70–99)

## 2019-10-10 LAB — RENAL FUNCTION PANEL
Albumin: 2.7 g/dL — ABNORMAL LOW (ref 3.5–5.0)
Anion gap: 12 (ref 5–15)
BUN: 52 mg/dL — ABNORMAL HIGH (ref 6–20)
CO2: 26 mmol/L (ref 22–32)
Calcium: 9 mg/dL (ref 8.9–10.3)
Chloride: 98 mmol/L (ref 98–111)
Creatinine, Ser: 8.05 mg/dL — ABNORMAL HIGH (ref 0.61–1.24)
GFR calc Af Amer: 8 mL/min — ABNORMAL LOW (ref >60)
GFR calc non Af Amer: 7 mL/min — ABNORMAL LOW (ref >60)
Glucose, Bld: 179 mg/dL — ABNORMAL HIGH (ref 70–99)
Phosphorus: 4.7 mg/dL — ABNORMAL HIGH (ref 2.5–4.6)
Potassium: 5.1 mmol/L (ref 3.5–5.1)
Sodium: 136 mmol/L (ref 135–145)

## 2019-10-10 LAB — CBC
HCT: 27.5 % — ABNORMAL LOW (ref 39.0–52.0)
Hemoglobin: 8.2 g/dL — ABNORMAL LOW (ref 13.0–17.0)
MCH: 28.4 pg (ref 26.0–34.0)
MCHC: 29.8 g/dL — ABNORMAL LOW (ref 30.0–36.0)
MCV: 95.2 fL (ref 80.0–100.0)
Platelets: 123 10*3/uL — ABNORMAL LOW (ref 150–400)
RBC: 2.89 MIL/uL — ABNORMAL LOW (ref 4.22–5.81)
RDW: 15.2 % (ref 11.5–15.5)
WBC: 6.2 10*3/uL (ref 4.0–10.5)
nRBC: 0 % (ref 0.0–0.2)

## 2019-10-10 MED ORDER — NEPRO/CARBSTEADY PO LIQD
237.0000 mL | Freq: Two times a day (BID) | ORAL | Status: DC
  Administered 2019-10-10 – 2019-10-16 (×10): 237 mL via ORAL

## 2019-10-10 MED ORDER — HEPARIN SODIUM (PORCINE) 1000 UNIT/ML IJ SOLN
INTRAMUSCULAR | Status: AC
  Administered 2019-10-10: 4200 [IU]
  Filled 2019-10-10: qty 5

## 2019-10-10 MED ORDER — CALCITRIOL 0.5 MCG PO CAPS
ORAL_CAPSULE | ORAL | Status: AC
  Administered 2019-10-10: 1 ug via ORAL
  Filled 2019-10-10: qty 2

## 2019-10-10 NOTE — Progress Notes (Signed)
PROGRESS NOTE    Jorge [name]  WLN:[id number] DOB: 03/12/1970 DOA: 10/03/2019 PCP: Lauree Chandler, NP   Brief Narrative:  Jorge [name] is a 49 y.o. male past medical history of diabetes with gastroparesis, hypertension, BKA, ESRD on peritoneal dialysis, with no prior medical history of strokes, presented to the emergency room for sudden onset of left-sided weakness and confusion. Last known normal 9 PM on 10/02/2019.  Seen again by family this morning and speech was slurred.  Was complaining of a headache.  Appeared more drowsy to family.  EMS was called.  On EMS evaluation, exhibited symptoms of left-sided weakness and fast ED calculator showing an LVO score of 4 prompting activation of an LVO positive code stroke. Brought into the emergency room, evaluated at the bridge-NIH stroke scale of 11 consistent with a right hemispheric stroke.  Stat CT performed, consistent with 15 cc hematoma in the right putamen and adjacent white matter with a small volume subarachnoid extension locally.  Small remote left frontal cortex insult and small remote left cerebellar infarct. Upon arrival: Blood sugars in the 74Y, systolic blood pressure 814G. Given D50 prior to CT.   Assessment & Plan: Active Problems:   ICH (intracerebral hemorrhage) ([name])   Small remote left frontal cortex insult and small remote left cerebellar infarct. R putamen and adjacent white matter ICH w/ IVH & SAH  - As noted on CT head at admission - aspirin 81 mg daily prior to admission, currently holding all antiplatelet or thrombotic agents given hemorrhage  -PT OT speech following, currently recommending CIR versus SNF  Hypertension Currently well controlled on: amlodipine 10, coreg 12.5 bid, lasix 20, prn Labetalol    Hyperlipidemia Statin contraindicated with ICH per neurology recommendations  Diabetes Hemoglobin 4.9, somewhat tight control, may liberalize diet or decrease patient's insulin  ESRD dialysis Tuesday  Thursday Saturday Nephrology following, appreciate insight and recommendations  C. difficile positive, POA  Continue vancomycin per protocol  DVT prophylaxis: None given intracranial hemorrhage as above Code Status: Full Family Communication: None present  Status is: Inpatient  [name]: The patient is from: Home              Anticipated d/c is to: CIR versus SNF              Anticipated d/c date is: 72 to 96 hours pending clinical course              Patient currently not medically stable for discharge  Consultants:   Nephrology  Procedures:   None indicated  Antimicrobials:  Vancomycin  Subjective: No acute issues or events overnight denies nausea vomiting diarrhea constipation headache fevers or chills  Objective: Vitals:   10/10/19 0715 10/10/19 0730 10/10/19 0800 10/10/19 0830  BP: (!) 151/84 129/76 (!) 112/56 114/62  Pulse: 67 70 73 69  Resp: 11 16 19 12   Temp:      TempSrc:      SpO2:      Weight:      Height:        Intake/Output Summary (Last 24 hours) at 10/10/2019 0911 Last data filed at 10/09/2019 2150 Gross per 24 hour  Intake 240 ml  Output --  Net 240 ml   Filed Weights   10/08/19 0723 10/08/19 1104 10/10/19 0710  Weight: 71.3 kg 70.2 kg 70.8 kg    Examination:  General:  Pleasantly resting in bed, No acute distress. HEENT:  Normocephalic atraumatic.  Sclerae nonicteric, noninjected.  Extraocular movements intact bilaterally.  Neck:  Without mass or deformity.  Trachea is midline. Lungs:  Clear to auscultate bilaterally without rhonchi, wheeze, or rales. Heart:  Regular rate and rhythm.  Without murmurs, rubs, or gallops. Abdomen:  Soft, nontender, nondistended.  Without guarding or rebound. Extremities: Without cyanosis, clubbing, edema, or obvious deformity. Vascular:  Dorsalis pedis and posterior tibial pulses palpable bilaterally. Skin:  Warm and dry, no erythema, no ulcerations.  Data Reviewed: I have personally reviewed following labs  and imaging studies  CBC: Recent Labs  Lab 10/05/19 0231 10/06/19 0803 10/08/19 0825 10/10/19 0729  WBC 7.4 5.9 6.6 6.2  HGB 8.7* 8.5* 8.5* 8.2*  HCT 27.7* 28.9* 28.5* 27.5*  MCV 95.5 100.0 98.6 95.2  PLT 104* 102* 127* 154*   Basic Metabolic Panel: Recent Labs  Lab 10/05/19 0231 10/06/19 0803 10/08/19 0551 10/09/19 1219 10/10/19 0730  NA 134* 136 134* 135 136  K 3.5 4.4 5.1 4.8 5.1  CL 97* 102 102 99 98  CO2 27 22 20* 24 26  GLUCOSE 156* 132* 91 106* 179*  BUN 17 41* 51* 35* 52*  CREATININE 5.12* 7.70* 7.38* 6.18* 8.05*  CALCIUM 7.9* 8.4* 8.6* 8.8* 9.0  PHOS 2.8 4.1 4.1 3.8 4.7*   GFR: Estimated Creatinine Clearance: 10.9 mL/min (A) (by C-G formula based on SCr of 8.05 mg/dL (H)). Liver Function Tests: Recent Labs  Lab 10/05/19 0231 10/06/19 0803 10/08/19 0551 10/09/19 1219 10/10/19 0730  ALBUMIN 2.7* 2.7* 2.8* 2.9* 2.7*   No results for input(s): LIPASE, AMYLASE in the last 168 hours. No results for input(s): AMMONIA in the last 168 hours. Coagulation Profile: No results for input(s): INR, PROTIME in the last 168 hours. Cardiac Enzymes: No results for input(s): CKTOTAL, CKMB, CKMBINDEX, TROPONINI in the last 168 hours. BNP (last 3 results) No results for input(s): PROBNP in the last 8760 hours. HbA1C: No results for input(s): HGBA1C in the last 72 hours. CBG: Recent Labs  Lab 10/09/19 1118 10/09/19 1217 10/09/19 1649 10/09/19 2149 10/10/19 0624  GLUCAP 53* 107* 109* 217* 161*   Lipid Profile: No results for input(s): CHOL, HDL, LDLCALC, TRIG, CHOLHDL, LDLDIRECT in the last 72 hours. Thyroid Function Tests: No results for input(s): TSH, T4TOTAL, FREET4, T3FREE, THYROIDAB in the last 72 hours. Anemia Panel: No results for input(s): VITAMINB12, FOLATE, FERRITIN, TIBC, IRON, RETICCTPCT in the last 72 hours. Sepsis Labs: No results for input(s): PROCALCITON, LATICACIDVEN in the last 168 hours.  Recent Results (from the past 240 hour(s))    Respiratory Panel by RT PCR (Flu A&B, Covid) - Nasopharyngeal Swab     Status: None   Collection Time: 10/03/19 10:00 AM   Specimen: Nasopharyngeal Swab  Result Value Ref Range Status   SARS Coronavirus 2 by RT PCR NEGATIVE NEGATIVE Final    Comment: (NOTE) SARS-CoV-2 target nucleic acids are NOT DETECTED.  The SARS-CoV-2 RNA is generally detectable in upper respiratoy specimens during the acute phase of infection. The lowest concentration of SARS-CoV-2 viral copies this assay can detect is 131 copies/mL. A negative result does not preclude SARS-Cov-2 infection and should not be used as the sole basis for treatment or other patient management decisions. A negative result may occur with  improper specimen collection/handling, submission of specimen other than nasopharyngeal swab, presence of viral mutation(s) within the areas targeted by this assay, and inadequate number of viral copies (<131 copies/mL). A negative result must be combined with clinical observations, patient history, and epidemiological information. The expected result is Negative.  Fact Sheet for Patients:  PinkCheek.be  Fact Sheet for Healthcare Providers:  GravelBags.it  This test is no t yet approved or cleared by the [place] FDA and  has been authorized for detection and/or diagnosis of SARS-CoV-2 by FDA under an Emergency Use Authorization (EUA). This EUA will remain  in effect (meaning this test can be used) for the duration of the COVID-19 declaration under Section 564(b)(1) of the Act, 21 U.S.C. section 360bbb-3(b)(1), unless the authorization is terminated or revoked sooner.     Influenza A by PCR NEGATIVE NEGATIVE Final   Influenza B by PCR NEGATIVE NEGATIVE Final    Comment: (NOTE) The Xpert Xpress SARS-CoV-2/FLU/RSV assay is intended as an aid in  the diagnosis of influenza from Nasopharyngeal swab specimens and  should not be used as  a sole basis for treatment. Nasal washings and  aspirates are unacceptable for Xpert Xpress SARS-CoV-2/FLU/RSV  testing.  Fact Sheet for Patients: PinkCheek.be  Fact Sheet for Healthcare Providers: GravelBags.it  This test is not yet approved or cleared by the [place] FDA and  has been authorized for detection and/or diagnosis of SARS-CoV-2 by  FDA under an Emergency Use Authorization (EUA). This EUA will remain  in effect (meaning this test can be used) for the duration of the  Covid-19 declaration under Section 564(b)(1) of the Act, 21  U.S.C. section 360bbb-3(b)(1), unless the authorization is  terminated or revoked. Performed at [facility], [place] [address] [place], [place] [address]   MRSA PCR Screening     Status: None   Collection Time: 10/03/19  2:57 PM   Specimen: Nasal Mucosa; Nasopharyngeal  Result Value Ref Range Status   MRSA by PCR NEGATIVE NEGATIVE Final    Comment:        The GeneXpert MRSA Assay (FDA approved for NASAL specimens only), is one component of a comprehensive MRSA colonization surveillance program. It is not intended to diagnose MRSA infection nor to guide or monitor treatment for MRSA infections. Performed at [facility], [place] [address] [place], [place] [address]   C Difficile Quick Screen w PCR reflex     Status: Abnormal   Collection Time: 10/05/19  9:41 PM   Specimen: STOOL  Result Value Ref Range Status   C Diff antigen POSITIVE (A) NEGATIVE Final   C Diff toxin NEGATIVE NEGATIVE Final   C Diff interpretation Results are indeterminate. See PCR results.  Final    Comment: Performed at [facility], [place] [address], [place], [place] [address]  C. Diff by PCR, Reflexed     Status: Abnormal   Collection Time: 10/05/19  9:41 PM  Result Value Ref Range Status   Toxigenic C. Difficile by PCR POSITIVE (A) NEGATIVE Final    Comment: Positive for toxigenic C.  difficile with little to no toxin production. Only treat if clinical presentation suggests symptomatic illness. Performed at [facility], [place] [address], [place], [place] [address]   SARS Coronavirus 2 by RT PCR (hospital order, performed in [facility] hospital lab) Nasopharyngeal Nasopharyngeal Swab     Status: None   Collection Time: 10/08/19  2:33 PM   Specimen: Nasopharyngeal Swab  Result Value Ref Range Status   SARS Coronavirus 2 NEGATIVE NEGATIVE Final    Comment: (NOTE) SARS-CoV-2 target nucleic acids are NOT DETECTED.  The SARS-CoV-2 RNA is generally detectable in upper and lower respiratory specimens during the acute phase of infection. The lowest concentration of SARS-CoV-2 viral copies this assay can detect  is 250 copies / mL. A negative result does not preclude SARS-CoV-2 infection and should not be used as the sole basis for treatment or other patient management decisions.  A negative result may occur with improper specimen collection / handling, submission of specimen other than nasopharyngeal swab, presence of viral mutation(s) within the areas targeted by this assay, and inadequate number of viral copies (<250 copies / mL). A negative result must be combined with clinical observations, patient history, and epidemiological information.  Fact Sheet for Patients:   StrictlyIdeas.no  Fact Sheet for Healthcare Providers: BankingDealers.co.za  This test is not yet approved or  cleared by the [place] FDA and has been authorized for detection and/or diagnosis of SARS-CoV-2 by FDA under an Emergency Use Authorization (EUA).  This EUA will remain in effect (meaning this test can be used) for the duration of the COVID-19 declaration under Section 564(b)(1) of the Act, 21 U.S.C. section 360bbb-3(b)(1), unless the authorization is terminated or revoked sooner.  Performed at [facility], [place] [address]  [place], [place] [address]          Radiology Studies: No results found.      Scheduled Meds: . amLODipine  10 mg Oral Daily  . atorvastatin  20 mg Oral QHS  . calcitRIOL  1 mcg Oral Q T,Th,Sa-HD  . carvedilol  12.5 mg Oral BID WC  . Chlorhexidine Gluconate Cloth  6 each Topical Q0600  . Chlorhexidine Gluconate Cloth  6 each Topical Q0600  . darbepoetin (ARANESP) injection - DIALYSIS  100 mcg Intravenous Q Sat-HD  . feeding supplement (NEPRO CARB STEADY)  237 mL Oral BID BM  . furosemide  20 mg Oral Daily  . gabapentin  300 mg Oral Daily  . insulin aspart  0-6 Units Subcutaneous TID WC  . multivitamin  1 tablet Oral QHS  . pantoprazole  40 mg Oral QHS  . senna-docusate  1 tablet Oral BID  . sevelamer carbonate  1,600 mg Oral TID WC  . vancomycin  125 mg Oral QID  . Vitamin D (Ergocalciferol)  50,000 Units Oral Q Sat   Continuous Infusions: . ferric gluconate (FERRLECIT/NULECIT) IV 125 mg (10/10/19 0847)     LOS: 7 days   Time spent: 48min  Amara Justen C Shiva Karis, DO [facility]  If 7PM-7AM, please contact night-coverage [email or web address]  10/10/2019, 9:11 AM

## 2019-10-10 NOTE — Progress Notes (Signed)
STROKE TEAM PROGRESS NOTE   INTERVAL HISTORY No family at bedside.  Patient lying in bed, sleepy after dialysis, however arousable and follows simple commands on exam.  Still has mild left sided weakness. Hb and platelet stable. Pending SNF.  OBJECTIVE Vitals:   10/10/19 0730 10/10/19 0800 10/10/19 0830 10/10/19 0900  BP: 129/76 (!) 112/56 114/62 (!) 123/55  Pulse: 70 73 69 70  Resp: 16 19 12 11   Temp:      TempSrc:      SpO2:      Weight:      Height:       CBC:  Recent Labs  Lab 10/08/19 0825 10/10/19 0729  WBC 6.6 6.2  HGB 8.5* 8.2*  HCT 28.5* 27.5*  MCV 98.6 95.2  PLT 127* 993*   Basic Metabolic Panel:  Recent Labs  Lab 10/09/19 1219 10/10/19 0730  NA 135 136  K 4.8 5.1  CL 99 98  CO2 24 26  GLUCOSE 106* 179*  BUN 35* 52*  CREATININE 6.18* 8.05*  CALCIUM 8.8* 9.0  PHOS 3.8 4.7*    IMAGING past 24h  No results found.     PHYSICAL EXAM    Middle-age African-American male not in distress.  He has bilateral below-knee amputations. Afebrile. Head is nontraumatic. Neck is supple without bruit.    Cardiac exam no murmur or gallop. Lungs are clear to auscultation. Distal pulses are well felt. Neurological Exam :  He is sleepy but arousable and following simple commands on exam.  Oriented to time place and person.  Extraocular movements are full range on nystagmus.  Mild dysarthria no aphasia. Visual field full.  Left lower facial weakness.  Tongue midline.  Motor system exam shows mildLUE drift with 4/5 strength in the left upper extremity proximally and distally.  And 4+/5 strength in the LLE at the thighs.  Cannot test distally in lower extremities due to bilateral BKA.  Sensation appears preserved bilaterally.  Plantars are downgoing.    ASSESSMENT/PLAN [name] is a 49 y.o. male with history of diabetes with gastroparesis, decreased vision left eye, hypertension, BKA, ESRD on peritoneal dialysis, presented to the emergency room with sudden onset of  left-sided weakness, confusion, slurred speech and headache. Blood sugars in the 57S, systolic blood pressure 177L. Stat CT c/w 15 cc hematoma in the right putamen and adjacent white matter with a small volume subarachnoid extension locally. Small remote left frontal cortex insult and small remote left cerebellar infarct. He did not receive IV t-PA due to [place].  ICH: R putamen and adjacent white matter ICH w/ IVH & SAH, likely related to HTN   Resultant mild left hemiparesis  CT Head - 15 cc acute hematoma at the right putamen and adjacent white matter. Small volume subarachnoid extension seen locally. Small remote left frontal cortex insult, usually infarct. Small remote left cerebellar infarct.   CT head - stable R putamen/external capsule IPH measuring 3.5 cm. New IVH R lateral ventricle. SAH L insula more defined   Echo - 10/04/19 - EF 60 - 65%  Sars Corona Virus 2  - negative  LDL - 54  HgbA1c - 4.9  VTE prophylaxis - SCDs    aspirin 81 mg daily prior to admission, now on No antithrombotic given hemorrhage   Therapy recommendations:  CIR   Disposition:  Pending  Fever C.diff  C.diff antigen positive  Overnight Tmax 101.5  Afebrile this am  Blood culture pending  On vanco po for C.diff  Hypertension  Home BP meds: amlodipine ; Coreg ; catapres  Current BP meds: amlodipine 10, coreg 12.5 bid, lasix 20    . BP goal < 160 . Long-term BP goal normotensive  Hyperlipidemia  Home Lipid lowering medication: Lipitor 20 mg daily  LDL 54, goal < 70  Current lipid lowering medication:  Resumed lipitor 20  Continue statin at discharge  Diabetes Episode of hypoglycemia  Home diabetic meds: insulin  HgbA1c 4.9, goal < 7.0  Episode of low glucose 50s 10/09/19  Now stable  SSI  CBG monitoring  Other Stroke Risk Factors  Former cigarette smoker - quit  Previous ETOH use  Family hx stroke (brother)   Hx stroke/TIA on imaging  Previous substance abuse -  cocaine and marijuana   Other Active Problems Code status - Full code ESRD - HD TTS - creatinine - 7.59->8.10->5.12->7.7->7.38->8.05 Anemia of chronic kidney disease  - Hgb - 9.7->10.9->8.7->8.5->8.5->8.2 Thrombocytopenia - platelets - 81->388->719->597->471 Metabolic bone disease Covid - 10/08/19 - negative (for SNF placement)   Hospital day # 7   Neurology will sign off. Please call with questions. Pt will follow up with stroke clinic NP at [facility] in about 4 weeks. Thanks for the consult.  Rosalin Hawking, MD PhD Stroke Neurology 10/10/2019 3:33 PM   To contact Stroke Continuity provider, please refer to [email or web address] After hours, contact General Neurology

## 2019-10-10 NOTE — Progress Notes (Signed)
   10/10/19 1045  Hand-Off documentation  Handoff Given Given to shift RN/LPN  Report given to (Full Name) [facility] Received Received from Transfer Unit/facility  Report received from (Full Name) Lastacia Solum,RN  Vitals  Temp 98.4 F (36.9 C)  Temp Source Oral  BP (!) 111/59  BP Location Left Arm  BP Method Automatic  Patient Position (if appropriate) Lying  Pulse Rate 72  ECG Heart Rate 70  Resp 13  Oxygen Therapy  SpO2 96 %  O2 Device Room Air  Dialysis Weight  Weight 69.6 kg  Type of Weight Post-Dialysis  Post-Hemodialysis Assessment  Rinseback Volume (mL) 250 mL  KECN 289 V  Dialyzer Clearance Lightly streaked  Duration of HD Treatment -hour(s) 3.5 hour(s)  Hemodialysis Intake (mL) 600 mL  UF Total -Machine (mL) 1600 mL  Net UF (mL) 1000 mL  Tolerated HD Treatment Yes  Post-Hemodialysis Comments tx complete-pt stable  Hemodialysis Catheter Left Internal jugular Double lumen Permanent (Tunneled)  Placement Date/Time: 08/24/19 1042   Placed prior to admission: No  Time Out: Correct patient;Correct site;Correct procedure  Maximum sterile barrier precautions: Sterile gown;Mask;Hand hygiene;Sterile gloves;Large sterile sheet  Site Prep: Betadine;S...  Site Condition No complications  Blue Lumen Status Flushed;Capped (Central line);Heparin locked  Red Lumen Status Flushed;Capped (Central line);Heparin locked  Catheter fill solution Heparin 1000 units/ml  Catheter fill volume (Arterial) 2.1 cc  Catheter fill volume (Venous) 2.1  Dressing Type Occlusive  Dressing Status Clean;Dry;Intact;Antimicrobial disc in place  Drainage Description None  Dressing Change Due 10/11/19  Post treatment catheter status Capped and Clamped  HD complete-pt stable.UF=1 liter net.

## 2019-10-10 NOTE — Progress Notes (Signed)
[facility] Progress Note   Subjective:  Seen on HD -- difficult to arouse, but eventually woke with sternal rub. Says he is tired and didn't want to answer questions - vitals ok, 1L net UFG and tolerating. This was noted overnight as well and was febrile -- blood cultures were drawn early this morning.  Objective Vitals:   10/10/19 0712 10/10/19 0715 10/10/19 0730 10/10/19 0800  BP: (!) 153/74 (!) 151/84 129/76 (!) 112/56  Pulse: 66 67 70 73  Resp:  11 16 19   Temp:      TempSrc:      SpO2:      Weight:      Height:       Physical Exam General:  Frail man, drowsy/difficult to arouse Heart: RRR; no murmur Lungs: CTA anteriorly Abdomen: soft, non-tender Extremities: B BKA, no stump edema Dialysis Access:  TDC + RUE AVF + bruit  Additional Objective Labs: Basic Metabolic Panel: Recent Labs  Lab 10/06/19 0803 10/08/19 0551 10/09/19 1219  NA 136 134* 135  K 4.4 5.1 4.8  CL 102 102 99  CO2 22 20* 24  GLUCOSE 132* 91 106*  BUN 41* 51* 35*  CREATININE 7.70* 7.38* 6.18*  CALCIUM 8.4* 8.6* 8.8*  PHOS 4.1 4.1 3.8   Liver Function Tests: Recent Labs  Lab 10/06/19 0803 10/08/19 0551 10/09/19 1219  ALBUMIN 2.7* 2.8* 2.9*   CBC: Recent Labs  Lab 10/05/19 0231 10/05/19 0231 10/06/19 0803 10/08/19 0825 10/10/19 0729  WBC 7.4   < > 5.9 6.6 6.2  HGB 8.7*   < > 8.5* 8.5* 8.2*  HCT 27.7*   < > 28.9* 28.5* 27.5*  MCV 95.5  --  100.0 98.6 95.2  PLT 104*   < > 102* 127* 123*   < > = values in this interval not displayed.   Medications: . ferric gluconate (FERRLECIT/NULECIT) IV 125 mg (10/08/19 0943)   . amLODipine  10 mg Oral Daily  . atorvastatin  20 mg Oral QHS  . calcitRIOL  1 mcg Oral Q T,Th,Sa-HD  . carvedilol  12.5 mg Oral BID WC  . Chlorhexidine Gluconate Cloth  6 each Topical Q0600  . Chlorhexidine Gluconate Cloth  6 each Topical Q0600  . darbepoetin (ARANESP) injection - DIALYSIS  100 mcg Intravenous Q Sat-HD  . furosemide  20 mg Oral  Daily  . gabapentin  300 mg Oral Daily  . insulin aspart  0-6 Units Subcutaneous TID WC  . multivitamin  1 tablet Oral QHS  . pantoprazole  40 mg Oral QHS  . senna-docusate  1 tablet Oral BID  . sevelamer carbonate  1,600 mg Oral TID WC  . vancomycin  125 mg Oral QID  . Vitamin D (Ergocalciferol)  50,000 Units Oral Q Sat    Dialysis Orders: [facility] TTS 4h 68kg 2/2.5 bath Hep 4200 (holding bolus heparin for now / ok for heparin block to [facility]) R BVT AVF (08/28/19, Dr Donnetta Hutching, maturing) + L IJ TDC - mircera 200 q 2 last 9/16 - venofer 100 x 3 more doses - calcitriol 1 ug tiw Recent labs: hgb 9 trending up tsat 18%   Assessment/Plan: 1.Hemorrhagic R brain CVA - per Neurology. No heparin with HD. Ok to use to lock [facility]. 2. ESRD- Continue HD per TTS sched. HD today, 1L UFG.Using [facility]; AVF maturing 3. Anemiaof CKD-Hgb 8.2. Last tsat 9%, on Venofer course + ESA. 4. Secondary hyperparathyroidism- Ca and phos in goal. Continue binders and VDRA - renvela decreased  to 2AC TID due to initial low phos.  5.HTN/volume- s/p IV cleviprex. Per primary/stroke team in setting of CVA. Initial goal to keep SBP <140, now <160. On amlodipine 10mg , coreg 12.5 BID, lasix 20mg  and prn labetalol 6. Nutrition- Alb low, will add supplements 7.C diff colitis- per primary, on PO vanc 8.DMT2- per primary. On insulin 9. [name]- Plan is for SNF 10. AMS + recurrent fever (10/1): Blood Cx drawn 10/1 - pending.  Veneta Penton, PA-C 10/10/2019, 8:18 AM  [employer]

## 2019-10-10 NOTE — Progress Notes (Signed)
Brief Neuro Note:  Notified by RN about patient difficult to arouse and Tmax of 101.5. He is positive for Cdiff and is on treatment with Vancomycin. He has had episodes of hypoglycemia but his glucose is 200+. Vitals otherwisehave been stable and he is on room air. I briefly saw the patient and he would not respond to voice or loud clap. Upon nares stimulation with a Qtip, he woke up, was very angry with me, told me he is tired of being woken up all day. He did tell me his name but then went back to sleep right away. Suspect he is just sleep deprived. Will get Blood cultures given his fever.  Recs: - Will get blood cultures x 2.  [place] Pager Number [phone]

## 2019-10-11 DIAGNOSIS — A0472 Enterocolitis due to Clostridium difficile, not specified as recurrent: Secondary | ICD-10-CM | POA: Diagnosis not present

## 2019-10-11 LAB — GLUCOSE, CAPILLARY
Glucose-Capillary: 158 mg/dL — ABNORMAL HIGH (ref 70–99)
Glucose-Capillary: 227 mg/dL — ABNORMAL HIGH (ref 70–99)
Glucose-Capillary: 168 mg/dL — ABNORMAL HIGH (ref 70–99)
Glucose-Capillary: 171 mg/dL — ABNORMAL HIGH (ref 70–99)

## 2019-10-11 MED ORDER — INSULIN GLARGINE 100 UNIT/ML ~~LOC~~ SOLN
5.0000 [IU] | Freq: Every day | SUBCUTANEOUS | Status: DC
  Administered 2019-10-11 – 2019-10-16 (×6): 5 [IU] via SUBCUTANEOUS
  Filled 2019-10-11 (×6): qty 0.05

## 2019-10-11 NOTE — Progress Notes (Signed)
PROGRESS NOTE    Jorge [name]  KWI:[id number] DOB: [birth year] DOA: 10/03/2019 PCP: Lauree Chandler, NP   Brief Narrative:  Jorge [name] is a 49 y.o. male past medical history of diabetes with gastroparesis, hypertension, BKA, ESRD on peritoneal dialysis, with no prior medical history of strokes, presented to the emergency room for sudden onset of left-sided weakness and confusion. Last known normal 9 PM on 10/02/2019.  Seen again by family this morning and speech was slurred.  Was complaining of a headache.  Appeared more drowsy to family.  EMS was called.  On EMS evaluation, exhibited symptoms of left-sided weakness and fast ED calculator showing an LVO score of 4 prompting activation of an LVO positive code stroke. Brought into the emergency room, evaluated at the bridge-NIH stroke scale of 11 consistent with a right hemispheric stroke.  Stat CT performed, consistent with 15 cc hematoma in the right putamen and adjacent white matter with a small volume subarachnoid extension locally.  Small remote left frontal cortex insult and small remote left cerebellar infarct. Upon arrival: Blood sugars in the 24Q, systolic blood pressure 683M. Given D50 prior to CT.   Assessment & Plan: Active Problems:   ICH (intracerebral hemorrhage) ([name])   C. difficile diarrhea   Small remote left frontal cortex insult and small remote left cerebellar infarct. R putamen and adjacent white matter ICH w/ IVH & SAH  - As noted on CT head at admission - aspirin 81 mg daily prior to admission, currently holding all antiplatelet or thrombotic agents given hemorrhage  - defer to neurology when to resume -PT OT speech following, currently recommending CIR versus SNF  Hypertension Currently well controlled on: amlodipine 10, coreg 12.5 bid, lasix 20, prn Labetalol    Hyperlipidemia Statin resumed per neurology; lipitor 20  Diabetes type 2 controlled. Hemoglobin 4.9, somewhat tight control prior to admit but  glucose somewhat elevated here Unclear if simply poor PO intake prior to admit Glargine (10u) recently discontinued on the 30th will restart at 5 units QAM  ESRD dialysis Tuesday Thursday Saturday Nephrology following, appreciate insight and recommendations  C. difficile positive, POA  Continue vancomycin per protocol through 10/15/19 Diarrhea resolving appropriately   DVT prophylaxis: None given intracranial hemorrhage as above Code Status: Full Family Communication: None present  Status is: Inpatient  [name]: The patient is from: Home              Anticipated d/c is to: CIR versus SNF              Anticipated d/c date is: 24-48 hours pending clinical course and safe dispo location              Patient currently IS medically stable for discharge, simply awaiting for safe disposition location.  Consultants:   Nephrology  Procedures:   None indicated  Antimicrobials:  Vancomycin  Subjective: No acute issues or events overnight, patient feels quite well, still somewhat unsteady with ambulation feels weak but otherwise markedly improved from admission.  Updated sister over the phone at bedside, she is worried about his ambulation status and that she cannot be there to take care of him all day requesting discharge to assisted living facility versus SNF.  We discussed that patient is currently pending further evaluation for discharge medications at home.  Objective: Vitals:   10/10/19 1030 10/10/19 1042 10/10/19 1045 10/10/19 2220  BP: 122/60 112/63 (!) 111/59 (!) 106/52  Pulse: 78 76 72 80  Resp: (!) 7 16  13 19  Temp:   98.4 F (36.9 C) 99.5 F (37.5 C)  TempSrc:   Oral Oral  SpO2:   96% 98%  Weight:   69.6 kg   Height:        Intake/Output Summary (Last 24 hours) at 10/11/2019 0729 Last data filed at 10/10/2019 1045 Gross per 24 hour  Intake --  Output 1000 ml  Net -1000 ml   Filed Weights   10/08/19 1104 10/10/19 0710 10/10/19 1045  Weight: 70.2 kg 70.8 kg 69.6  kg    Examination:  General:  Pleasantly resting in bed, No acute distress. HEENT:  Normocephalic atraumatic.  Sclerae nonicteric, noninjected.  Extraocular movements intact bilaterally. Neck:  Without mass or deformity.  Trachea is midline. Lungs:  Clear to auscultate bilaterally without rhonchi, wheeze, or rales. Heart:  Regular rate and rhythm.  Without murmurs, rubs, or gallops. Abdomen:  Soft, nontender, nondistended.  Without guarding or rebound. Extremities: Without cyanosis, clubbing, edema, or obvious deformity. Vascular:  Dorsalis pedis and posterior tibial pulses palpable bilaterally. Skin:  Warm and dry, no erythema, no ulcerations.  Data Reviewed: I have personally reviewed following labs and imaging studies  CBC: Recent Labs  Lab 10/05/19 0231 10/06/19 0803 10/08/19 0825 10/10/19 0729  WBC 7.4 5.9 6.6 6.2  HGB 8.7* 8.5* 8.5* 8.2*  HCT 27.7* 28.9* 28.5* 27.5*  MCV 95.5 100.0 98.6 95.2  PLT 104* 102* 127* 161*   Basic Metabolic Panel: Recent Labs  Lab 10/05/19 0231 10/06/19 0803 10/08/19 0551 10/09/19 1219 10/10/19 0730  NA 134* 136 134* 135 136  K 3.5 4.4 5.1 4.8 5.1  CL 97* 102 102 99 98  CO2 27 22 20* 24 26  GLUCOSE 156* 132* 91 106* 179*  BUN 17 41* 51* 35* 52*  CREATININE 5.12* 7.70* 7.38* 6.18* 8.05*  CALCIUM 7.9* 8.4* 8.6* 8.8* 9.0  PHOS 2.8 4.1 4.1 3.8 4.7*   GFR: Estimated Creatinine Clearance: 10.7 mL/min (A) (by C-G formula based on SCr of 8.05 mg/dL (H)). Liver Function Tests: Recent Labs  Lab 10/05/19 0231 10/06/19 0803 10/08/19 0551 10/09/19 1219 10/10/19 0730  ALBUMIN 2.7* 2.7* 2.8* 2.9* 2.7*   No results for input(s): LIPASE, AMYLASE in the last 168 hours. No results for input(s): AMMONIA in the last 168 hours. Coagulation Profile: No results for input(s): INR, PROTIME in the last 168 hours. Cardiac Enzymes: No results for input(s): CKTOTAL, CKMB, CKMBINDEX, TROPONINI in the last 168 hours. BNP (last 3 results) No results  for input(s): PROBNP in the last 8760 hours. HbA1C: No results for input(s): HGBA1C in the last 72 hours. CBG: Recent Labs  Lab 10/09/19 2149 10/10/19 0624 10/10/19 1202 10/10/19 1717 10/10/19 2220  GLUCAP 217* 161* 135* 240* 199*   Lipid Profile: No results for input(s): CHOL, HDL, LDLCALC, TRIG, CHOLHDL, LDLDIRECT in the last 72 hours. Thyroid Function Tests: No results for input(s): TSH, T4TOTAL, FREET4, T3FREE, THYROIDAB in the last 72 hours. Anemia Panel: No results for input(s): VITAMINB12, FOLATE, FERRITIN, TIBC, IRON, RETICCTPCT in the last 72 hours. Sepsis Labs: No results for input(s): PROCALCITON, LATICACIDVEN in the last 168 hours.  Recent Results (from the past 240 hour(s))  Respiratory Panel by RT PCR (Flu A&B, Covid) - Nasopharyngeal Swab     Status: None   Collection Time: 10/03/19 10:00 AM   Specimen: Nasopharyngeal Swab  Result Value Ref Range Status   SARS Coronavirus 2 by RT PCR NEGATIVE NEGATIVE Final    Comment: (NOTE) SARS-CoV-2 target nucleic acids are  NOT DETECTED.  The SARS-CoV-2 RNA is generally detectable in upper respiratoy specimens during the acute phase of infection. The lowest concentration of SARS-CoV-2 viral copies this assay can detect is 131 copies/mL. A negative result does not preclude SARS-Cov-2 infection and should not be used as the sole basis for treatment or other patient management decisions. A negative result may occur with  improper specimen collection/handling, submission of specimen other than nasopharyngeal swab, presence of viral mutation(s) within the areas targeted by this assay, and inadequate number of viral copies (<131 copies/mL). A negative result must be combined with clinical observations, patient history, and epidemiological information. The expected result is Negative.  Fact Sheet for Patients:  PinkCheek.be  Fact Sheet for Healthcare Providers:    GravelBags.it  This test is no t yet approved or cleared by the [place] FDA and  has been authorized for detection and/or diagnosis of SARS-CoV-2 by FDA under an Emergency Use Authorization (EUA). This EUA will remain  in effect (meaning this test can be used) for the duration of the COVID-19 declaration under Section 564(b)(1) of the Act, 21 U.S.C. section 360bbb-3(b)(1), unless the authorization is terminated or revoked sooner.     Influenza A by PCR NEGATIVE NEGATIVE Final   Influenza B by PCR NEGATIVE NEGATIVE Final    Comment: (NOTE) The Xpert Xpress SARS-CoV-2/FLU/RSV assay is intended as an aid in  the diagnosis of influenza from Nasopharyngeal swab specimens and  should not be used as a sole basis for treatment. Nasal washings and  aspirates are unacceptable for Xpert Xpress SARS-CoV-2/FLU/RSV  testing.  Fact Sheet for Patients: PinkCheek.be  Fact Sheet for Healthcare Providers: GravelBags.it  This test is not yet approved or cleared by the [place] FDA and  has been authorized for detection and/or diagnosis of SARS-CoV-2 by  FDA under an Emergency Use Authorization (EUA). This EUA will remain  in effect (meaning this test can be used) for the duration of the  Covid-19 declaration under Section 564(b)(1) of the Act, 21  U.S.C. section 360bbb-3(b)(1), unless the authorization is  terminated or revoked. Performed at [facility], [place] [address] [place], [place] [address]   MRSA PCR Screening     Status: None   Collection Time: 10/03/19  2:57 PM   Specimen: Nasal Mucosa; Nasopharyngeal  Result Value Ref Range Status   MRSA by PCR NEGATIVE NEGATIVE Final    Comment:        The GeneXpert MRSA Assay (FDA approved for NASAL specimens only), is one component of a comprehensive MRSA colonization surveillance program. It is not intended to diagnose MRSA infection nor  to guide or monitor treatment for MRSA infections. Performed at [facility], [place] [address], [place], [place] [address]   C Difficile Quick Screen w PCR reflex     Status: Abnormal   Collection Time: 10/05/19  9:41 PM   Specimen: STOOL  Result Value Ref Range Status   C Diff antigen POSITIVE (A) NEGATIVE Final   C Diff toxin NEGATIVE NEGATIVE Final   C Diff interpretation Results are indeterminate. See PCR results.  Final    Comment: Performed at [facility], [place] [address], [place], [place] [address]  C. Diff by PCR, Reflexed     Status: Abnormal   Collection Time: 10/05/19  9:41 PM  Result Value Ref Range Status   Toxigenic C. Difficile by PCR POSITIVE (A) NEGATIVE Final    Comment: Positive for toxigenic C. difficile with little  to no toxin production. Only treat if clinical presentation suggests symptomatic illness. Performed at [facility], [place] [address] [place], [place] [address]   SARS Coronavirus 2 by RT PCR (hospital order, performed in [facility] hospital lab) Nasopharyngeal Nasopharyngeal Swab     Status: None   Collection Time: 10/08/19  2:33 PM   Specimen: Nasopharyngeal Swab  Result Value Ref Range Status   SARS Coronavirus 2 NEGATIVE NEGATIVE Final    Comment: (NOTE) SARS-CoV-2 target nucleic acids are NOT DETECTED.  The SARS-CoV-2 RNA is generally detectable in upper and lower respiratory specimens during the acute phase of infection. The lowest concentration of SARS-CoV-2 viral copies this assay can detect is 250 copies / mL. A negative result does not preclude SARS-CoV-2 infection and should not be used as the sole basis for treatment or other patient management decisions.  A negative result may occur with improper specimen collection / handling, submission of specimen other than nasopharyngeal swab, presence of viral mutation(s) within the areas targeted by this assay, and inadequate number of viral copies (<250 copies / mL). A negative  result must be combined with clinical observations, patient history, and epidemiological information.  Fact Sheet for Patients:   StrictlyIdeas.no  Fact Sheet for Healthcare Providers: BankingDealers.co.za  This test is not yet approved or  cleared by the [place] FDA and has been authorized for detection and/or diagnosis of SARS-CoV-2 by FDA under an Emergency Use Authorization (EUA).  This EUA will remain in effect (meaning this test can be used) for the duration of the COVID-19 declaration under Section 564(b)(1) of the Act, 21 U.S.C. section 360bbb-3(b)(1), unless the authorization is terminated or revoked sooner.  Performed at [facility], [place] [address], [place], [place] [address]          Radiology Studies: No results found.      Scheduled Meds: . amLODipine  10 mg Oral Daily  . atorvastatin  20 mg Oral QHS  . calcitRIOL  1 mcg Oral Q T,Th,Sa-HD  . carvedilol  12.5 mg Oral BID WC  . Chlorhexidine Gluconate Cloth  6 each Topical Q0600  . Chlorhexidine Gluconate Cloth  6 each Topical Q0600  . darbepoetin (ARANESP) injection - DIALYSIS  100 mcg Intravenous Q Sat-HD  . feeding supplement (NEPRO CARB STEADY)  237 mL Oral BID BM  . furosemide  20 mg Oral Daily  . gabapentin  300 mg Oral Daily  . insulin aspart  0-6 Units Subcutaneous TID WC  . multivitamin  1 tablet Oral QHS  . pantoprazole  40 mg Oral QHS  . senna-docusate  1 tablet Oral BID  . sevelamer carbonate  1,600 mg Oral TID WC  . vancomycin  125 mg Oral QID  . Vitamin D (Ergocalciferol)  50,000 Units Oral Q Sat   Continuous Infusions:    LOS: 8 days   Time spent: 35min  Alleah Dearman C Junie Avilla, DO [facility]  If 7PM-7AM, please contact night-coverage [email or web address]  10/11/2019, 7:29 AM

## 2019-10-11 NOTE — Plan of Care (Signed)
  Problem: Education: Goal: Knowledge of disease or condition will improve Outcome: Progressing   Problem: Nutrition: Goal: Dietary intake will improve Outcome: Progressing

## 2019-10-11 NOTE — Progress Notes (Signed)
[facility] Progress Note   Subjective:  Seen in room. Denies CP/dyspnea, diarrhea. No fevers overnight. Much more awake and conversational today. He is eating potato chips -> discussed too high in potassium and to avoid those.   Objective Vitals:   10/10/19 1042 10/10/19 1045 10/10/19 2220 10/11/19 0804  BP: 112/63 (!) 111/59 (!) 106/52 137/70  Pulse: 76 72 80 66  Resp: 16 13 19 16   Temp:  98.4 F (36.9 C) 99.5 F (37.5 C) 98 F (36.7 C)  TempSrc:  Oral Oral Oral  SpO2:  96% 98% 98%  Weight:  69.6 kg    Height:       Physical Exam General: Well appearing man, NAD Heart: RRR; no murmur Lungs: CTA anteriorly Abdomen: soft, non-tender Extremities: B BKA, no stump edema Dialysis Access: TDC in L chest, RUE AVF + thrill  Additional Objective Labs: Basic Metabolic Panel: Recent Labs  Lab 10/08/19 0551 10/09/19 1219 10/10/19 0730  NA 134* 135 136  K 5.1 4.8 5.1  CL 102 99 98  CO2 20* 24 26  GLUCOSE 91 106* 179*  BUN 51* 35* 52*  CREATININE 7.38* 6.18* 8.05*  CALCIUM 8.6* 8.8* 9.0  PHOS 4.1 3.8 4.7*   Liver Function Tests: Recent Labs  Lab 10/08/19 0551 10/09/19 1219 10/10/19 0730  ALBUMIN 2.8* 2.9* 2.7*   CBC: Recent Labs  Lab 10/05/19 0231 10/05/19 0231 10/06/19 0803 10/08/19 0825 10/10/19 0729  WBC 7.4   < > 5.9 6.6 6.2  HGB 8.7*   < > 8.5* 8.5* 8.2*  HCT 27.7*   < > 28.9* 28.5* 27.5*  MCV 95.5  --  100.0 98.6 95.2  PLT 104*   < > 102* 127* 123*   < > = values in this interval not displayed.   Blood Culture    Component Value Date/Time   SDES BLOOD LEFT HAND 10/10/2019 0119   SPECREQUEST  10/10/2019 0119    BOTTLES DRAWN AEROBIC AND ANAEROBIC Blood Culture adequate volume   CULT  10/10/2019 0119    NO GROWTH 1 DAY Performed at [facility], [place] [address], [place], [place] [address]    REPTSTATUS PENDING 10/10/2019 0119   Medications:  . amLODipine  10 mg Oral Daily  . atorvastatin  20 mg Oral QHS  . calcitRIOL  1  mcg Oral Q T,Th,Sa-HD  . carvedilol  12.5 mg Oral BID WC  . Chlorhexidine Gluconate Cloth  6 each Topical Q0600  . Chlorhexidine Gluconate Cloth  6 each Topical Q0600  . darbepoetin (ARANESP) injection - DIALYSIS  100 mcg Intravenous Q Sat-HD  . feeding supplement (NEPRO CARB STEADY)  237 mL Oral BID BM  . furosemide  20 mg Oral Daily  . gabapentin  300 mg Oral Daily  . insulin aspart  0-6 Units Subcutaneous TID WC  . multivitamin  1 tablet Oral QHS  . pantoprazole  40 mg Oral QHS  . senna-docusate  1 tablet Oral BID  . sevelamer carbonate  1,600 mg Oral TID WC  . vancomycin  125 mg Oral QID  . Vitamin D (Ergocalciferol)  50,000 Units Oral Q Sat    Dialysis Orders: [facility] 4h 68kg 2/2.5 bath Hep 4200 (holding bolus heparin for now / ok for heparin block to [facility]) R BVT AVF (08/28/19, Dr Donnetta Hutching, maturing) + L IJ TDC - mircera 200 q 2 last 9/16 - venofer 100 x 3 more doses - calcitriol 1 ug tiw Recent labs: hgb 9 trending up  tsat 18%  Assessment/Plan: 1.Hemorrhagic R brain CVA - per Neurology. No heparin with HD. Ok to use to lock [facility]. 2. ESRD- Continue HD per TTS sched - next HD 10/5.Using [facility]; AVF maturing 3. Anemiaof CKD-Hgb 8.2. Last tsat 9%, on Venofer course + ESA. 4. Secondary hyperparathyroidism- Ca and phos in goal. Continue binders and VDRA - renvela decreased to 2AC TID due to initial low phos.  5.HTN/volume- s/p IV cleviprex. Per primary/stroke team in setting of CVA. Initial goal to keep SBP <140, now <160. On amlodipine 10mg , coreg 12.5 BID, lasix 20mg  and prn labetalol 6. Nutrition- Alb low, continue supplements 7.C diff colitis- per primary, on PO vanc 8.DMT2- per primary. On insulin 9. [name]- Plan is for SNF 10. AMS + recurrent fever (10/1): Back to baseline MS. Blood Cx drawn 10/1 - NGTD.   Veneta Penton, PA-C 10/11/2019, 11:28 AM  [employer]

## 2019-10-12 ENCOUNTER — Ambulatory Visit: Payer: Medicare Other | Admitting: Rehabilitation

## 2019-10-12 ENCOUNTER — Other Ambulatory Visit: Payer: Self-pay

## 2019-10-12 ENCOUNTER — Encounter: Payer: Self-pay | Admitting: Rehabilitation

## 2019-10-12 ENCOUNTER — Encounter (HOSPITAL_COMMUNITY): Payer: Self-pay | Admitting: Student in an Organized Health Care Education/Training Program

## 2019-10-12 DIAGNOSIS — R2681 Unsteadiness on feet: Secondary | ICD-10-CM

## 2019-10-12 DIAGNOSIS — A0472 Enterocolitis due to Clostridium difficile, not specified as recurrent: Secondary | ICD-10-CM | POA: Diagnosis not present

## 2019-10-12 LAB — GLUCOSE, CAPILLARY
Glucose-Capillary: 182 mg/dL — ABNORMAL HIGH (ref 70–99)
Glucose-Capillary: 131 mg/dL — ABNORMAL HIGH (ref 70–99)
Glucose-Capillary: 87 mg/dL (ref 70–99)
Glucose-Capillary: 72 mg/dL (ref 70–99)

## 2019-10-12 NOTE — Progress Notes (Addendum)
Patient with temp over 100, MD notified, went to room to give tylenol. Patient up in chair, warm to touch.  Patient sleepy but rousable, able to follow directions but needing extensive cuing.  When asked why he seemed so sleepy, patient stated, "you guys never let me sleep at night".  2 person assist to transfer patient back to bed with his prosthetics and a walker.  Patient remains oriented and conversant, thanked staff for getting him back to bed, refused meal.  Call to rapid response team and reported out possible change in condition.

## 2019-10-12 NOTE — Progress Notes (Signed)
PROGRESS NOTE    Jorge [name]  HYI:[id number] DOB: May 28, 1970 DOA: 10/03/2019 PCP: Lauree Chandler, NP   Brief Narrative:  Jorge [name] is a 49 y.o. male past medical history of diabetes with gastroparesis, hypertension, BKA, ESRD on peritoneal dialysis, with no prior medical history of strokes, presented to the emergency room for sudden onset of left-sided weakness and confusion. Last known normal 9 PM on 10/02/2019.  Seen again by family this morning and speech was slurred.  Was complaining of a headache.  Appeared more drowsy to family.  EMS was called.  On EMS evaluation, exhibited symptoms of left-sided weakness and fast ED calculator showing an LVO score of 4 prompting activation of an LVO positive code stroke. Brought into the emergency room, evaluated at the bridge-NIH stroke scale of 11 consistent with a right hemispheric stroke.  Stat CT performed, consistent with 15 cc hematoma in the right putamen and adjacent white matter with a small volume subarachnoid extension locally.  Small remote left frontal cortex insult and small remote left cerebellar infarct. Upon arrival: Blood sugars in the 78M, systolic blood pressure 767M. Given D50 prior to CT.   Assessment & Plan: Active Problems:   ICH (intracerebral hemorrhage) ([name])   C. difficile diarrhea   Small remote left frontal cortex and small remote left cerebellar infarct. R putamen and adjacent white matter ICH w/ IVH & SAH  - As noted on CT head at admission - aspirin 81 mg daily prior to admission, currently holding all antiplatelet or thrombotic agents given hemorrhage  - defer to neurology when to resume - PT OT speech following, currently recommending CIR versus SNF  Hypertension - Currently well controlled on: amlodipine 10, coreg 12.5 bid, lasix 20, prn Labetalol    Hyperlipidemia - Statin resumed per neurology; lipitor 20  Diabetes type 2 controlled. - Hemoglobin 4.9, somewhat tight control prior to admit but  glucose somewhat elevated here - Unclear if simply poor PO intake prior to admit - Glargine (10u) recently restarted at 5 units QAM  ESRD dialysis Tuesday Thursday Saturday - Nephrology following, appreciate insight and recommendations  C. difficile positive, POA  - Continue vancomycin per protocol through 10/15/19 - Diarrhea resolving appropriately  DVT prophylaxis: None given intracranial hemorrhage as above Code Status: Full Family Communication: None present  Status is: Inpatient  [name]: The patient is from: Home              Anticipated d/c is to: CIR versus SNF              Anticipated d/c date is: 24-48 hours pending clinical course and safe dispo location              Patient currently IS medically stable for discharge, simply awaiting for safe disposition location.  Consultants:   Nephrology  Procedures:   None indicated  Antimicrobials:  Vancomycin  Subjective: No acute issues or events overnight, patient feels quite well, still somewhat unsteady with ambulation feels weak but otherwise markedly improved from admission.  Otherwise denies nausea, vomiting, diarrhea, constipation, headache, fevers, chills. Objective: Vitals:   10/10/19 2220 10/11/19 0804 10/11/19 1724 10/11/19 2133  BP: (!) 106/52 137/70 (!) 107/57 128/66  Pulse: 80 66 67 70  Resp: 19 16 18 19   Temp: 99.5 F (37.5 C) 98 F (36.7 C) 98 F (36.7 C) 98.2 F (36.8 C)  TempSrc: Oral Oral Oral Oral  SpO2: 98% 98% 99% 98%  Weight:      Height:  No intake or output data in the 24 hours ending 10/12/19 0731 Filed Weights   10/08/19 1104 10/10/19 0710 10/10/19 1045  Weight: 70.2 kg 70.8 kg 69.6 kg    Examination:  General:  Pleasantly resting in bed, No acute distress. HEENT:  Normocephalic atraumatic.  Sclerae nonicteric, noninjected.  Extraocular movements intact bilaterally. Neck:  Without mass or deformity.  Trachea is midline. Lungs:  Clear to auscultate bilaterally without  rhonchi, wheeze, or rales. Heart:  Regular rate and rhythm.  Without murmurs, rubs, or gallops. Abdomen:  Soft, nontender, nondistended.  Without guarding or rebound. Extremities: Without cyanosis, clubbing, edema, bilateral BKA Vascular:  Dorsalis pedis and posterior tibial pulses palpable bilaterally. Skin:  Warm and dry, no erythema, no ulcerations.  Data Reviewed: I have personally reviewed following labs and imaging studies  CBC: Recent Labs  Lab 10/06/19 0803 10/08/19 0825 10/10/19 0729  WBC 5.9 6.6 6.2  HGB 8.5* 8.5* 8.2*  HCT 28.9* 28.5* 27.5*  MCV 100.0 98.6 95.2  PLT 102* 127* 338*   Basic Metabolic Panel: Recent Labs  Lab 10/06/19 0803 10/08/19 0551 10/09/19 1219 10/10/19 0730  NA 136 134* 135 136  K 4.4 5.1 4.8 5.1  CL 102 102 99 98  CO2 22 20* 24 26  GLUCOSE 132* 91 106* 179*  BUN 41* 51* 35* 52*  CREATININE 7.70* 7.38* 6.18* 8.05*  CALCIUM 8.4* 8.6* 8.8* 9.0  PHOS 4.1 4.1 3.8 4.7*   GFR: Estimated Creatinine Clearance: 10.7 mL/min (A) (by C-G formula based on SCr of 8.05 mg/dL (H)). Liver Function Tests: Recent Labs  Lab 10/06/19 0803 10/08/19 0551 10/09/19 1219 10/10/19 0730  ALBUMIN 2.7* 2.8* 2.9* 2.7*   No results for input(s): LIPASE, AMYLASE in the last 168 hours. No results for input(s): AMMONIA in the last 168 hours. Coagulation Profile: No results for input(s): INR, PROTIME in the last 168 hours. Cardiac Enzymes: No results for input(s): CKTOTAL, CKMB, CKMBINDEX, TROPONINI in the last 168 hours. BNP (last 3 results) No results for input(s): PROBNP in the last 8760 hours. HbA1C: No results for input(s): HGBA1C in the last 72 hours. CBG: Recent Labs  Lab 10/10/19 2220 10/11/19 0731 10/11/19 1143 10/11/19 1707 10/11/19 2115  GLUCAP 199* 158* 227* 168* 171*   Lipid Profile: No results for input(s): CHOL, HDL, LDLCALC, TRIG, CHOLHDL, LDLDIRECT in the last 72 hours. Thyroid Function Tests: No results for input(s): TSH, T4TOTAL,  FREET4, T3FREE, THYROIDAB in the last 72 hours. Anemia Panel: No results for input(s): VITAMINB12, FOLATE, FERRITIN, TIBC, IRON, RETICCTPCT in the last 72 hours. Sepsis Labs: No results for input(s): PROCALCITON, LATICACIDVEN in the last 168 hours.  Recent Results (from the past 240 hour(s))  Respiratory Panel by RT PCR (Flu A&B, Covid) - Nasopharyngeal Swab     Status: None   Collection Time: 10/03/19 10:00 AM   Specimen: Nasopharyngeal Swab  Result Value Ref Range Status   SARS Coronavirus 2 by RT PCR NEGATIVE NEGATIVE Final    Comment: (NOTE) SARS-CoV-2 target nucleic acids are NOT DETECTED.  The SARS-CoV-2 RNA is generally detectable in upper respiratoy specimens during the acute phase of infection. The lowest concentration of SARS-CoV-2 viral copies this assay can detect is 131 copies/mL. A negative result does not preclude SARS-Cov-2 infection and should not be used as the sole basis for treatment or other patient management decisions. A negative result may occur with  improper specimen collection/handling, submission of specimen other than nasopharyngeal swab, presence of viral mutation(s) within the areas targeted by  this assay, and inadequate number of viral copies (<131 copies/mL). A negative result must be combined with clinical observations, patient history, and epidemiological information. The expected result is Negative.  Fact Sheet for Patients:  PinkCheek.be  Fact Sheet for Healthcare Providers:  GravelBags.it  This test is no t yet approved or cleared by the [place] FDA and  has been authorized for detection and/or diagnosis of SARS-CoV-2 by FDA under an Emergency Use Authorization (EUA). This EUA will remain  in effect (meaning this test can be used) for the duration of the COVID-19 declaration under Section 564(b)(1) of the Act, 21 U.S.C. section 360bbb-3(b)(1), unless the authorization is  terminated or revoked sooner.     Influenza A by PCR NEGATIVE NEGATIVE Final   Influenza B by PCR NEGATIVE NEGATIVE Final    Comment: (NOTE) The Xpert Xpress SARS-CoV-2/FLU/RSV assay is intended as an aid in  the diagnosis of influenza from Nasopharyngeal swab specimens and  should not be used as a sole basis for treatment. Nasal washings and  aspirates are unacceptable for Xpert Xpress SARS-CoV-2/FLU/RSV  testing.  Fact Sheet for Patients: PinkCheek.be  Fact Sheet for Healthcare Providers: GravelBags.it  This test is not yet approved or cleared by the [place] FDA and  has been authorized for detection and/or diagnosis of SARS-CoV-2 by  FDA under an Emergency Use Authorization (EUA). This EUA will remain  in effect (meaning this test can be used) for the duration of the  Covid-19 declaration under Section 564(b)(1) of the Act, 21  U.S.C. section 360bbb-3(b)(1), unless the authorization is  terminated or revoked. Performed at [facility], [place] [address], [place], [place] [address]   MRSA PCR Screening     Status: None   Collection Time: 10/03/19  2:57 PM   Specimen: Nasal Mucosa; Nasopharyngeal  Result Value Ref Range Status   MRSA by PCR NEGATIVE NEGATIVE Final    Comment:        The GeneXpert MRSA Assay (FDA approved for NASAL specimens only), is one component of a comprehensive MRSA colonization surveillance program. It is not intended to diagnose MRSA infection nor to guide or monitor treatment for MRSA infections. Performed at [facility], [place] [address], [place], [place] [address]   C Difficile Quick Screen w PCR reflex     Status: Abnormal   Collection Time: 10/05/19  9:41 PM   Specimen: STOOL  Result Value Ref Range Status   C Diff antigen POSITIVE (A) NEGATIVE Final   C Diff toxin NEGATIVE NEGATIVE Final   C Diff interpretation Results are indeterminate. See PCR results.  Final     Comment: Performed at [facility], [place] [address], [place], [place] [address]  C. Diff by PCR, Reflexed     Status: Abnormal   Collection Time: 10/05/19  9:41 PM  Result Value Ref Range Status   Toxigenic C. Difficile by PCR POSITIVE (A) NEGATIVE Final    Comment: Positive for toxigenic C. difficile with little to no toxin production. Only treat if clinical presentation suggests symptomatic illness. Performed at [facility], [place] [address] [place], [place] [address]   SARS Coronavirus 2 by RT PCR (hospital order, performed in [facility] hospital lab) Nasopharyngeal Nasopharyngeal Swab     Status: None   Collection Time: 10/08/19  2:33 PM   Specimen: Nasopharyngeal Swab  Result Value Ref Range Status   SARS Coronavirus 2 NEGATIVE NEGATIVE Final    Comment: (NOTE) SARS-CoV-2 target  nucleic acids are NOT DETECTED.  The SARS-CoV-2 RNA is generally detectable in upper and lower respiratory specimens during the acute phase of infection. The lowest concentration of SARS-CoV-2 viral copies this assay can detect is 250 copies / mL. A negative result does not preclude SARS-CoV-2 infection and should not be used as the sole basis for treatment or other patient management decisions.  A negative result may occur with improper specimen collection / handling, submission of specimen other than nasopharyngeal swab, presence of viral mutation(s) within the areas targeted by this assay, and inadequate number of viral copies (<250 copies / mL). A negative result must be combined with clinical observations, patient history, and epidemiological information.  Fact Sheet for Patients:   StrictlyIdeas.no  Fact Sheet for Healthcare Providers: BankingDealers.co.za  This test is not yet approved or  cleared by the [place] FDA and has been authorized for detection and/or diagnosis of SARS-CoV-2 by FDA under an Emergency Use Authorization  (EUA).  This EUA will remain in effect (meaning this test can be used) for the duration of the COVID-19 declaration under Section 564(b)(1) of the Act, 21 U.S.C. section 360bbb-3(b)(1), unless the authorization is terminated or revoked sooner.  Performed at [facility], [place] [address] [place], [place] [address]   Culture, blood (routine x 2)     Status: None (Preliminary result)   Collection Time: 10/10/19  1:14 AM   Specimen: BLOOD  Result Value Ref Range Status   Specimen Description BLOOD LEFT ARM  Final   Special Requests   Final    BOTTLES DRAWN AEROBIC AND ANAEROBIC Blood Culture adequate volume   Culture   Final    NO GROWTH 1 DAY Performed at [facility], [place] [address] [place], [place] [address]    Report Status PENDING  Incomplete  Culture, blood (routine x 2)     Status: None (Preliminary result)   Collection Time: 10/10/19  1:19 AM   Specimen: BLOOD  Result Value Ref Range Status   Specimen Description BLOOD LEFT HAND  Final   Special Requests   Final    BOTTLES DRAWN AEROBIC AND ANAEROBIC Blood Culture adequate volume   Culture   Final    NO GROWTH 1 DAY Performed at [facility], [place] [address], [place], [place] [address]    Report Status PENDING  Incomplete    Radiology Studies: No results found.  Scheduled Meds: . amLODipine  10 mg Oral Daily  . atorvastatin  20 mg Oral QHS  . calcitRIOL  1 mcg Oral Q T,Th,Sa-HD  . carvedilol  12.5 mg Oral BID WC  . Chlorhexidine Gluconate Cloth  6 each Topical Q0600  . Chlorhexidine Gluconate Cloth  6 each Topical Q0600  . darbepoetin (ARANESP) injection - DIALYSIS  100 mcg Intravenous Q Sat-HD  . feeding supplement (NEPRO CARB STEADY)  237 mL Oral BID BM  . furosemide  20 mg Oral Daily  . gabapentin  300 mg Oral Daily  . insulin aspart  0-6 Units Subcutaneous TID WC  . insulin glargine  5 Units Subcutaneous Daily  . multivitamin  1 tablet Oral QHS  . pantoprazole  40 mg Oral QHS  .  senna-docusate  1 tablet Oral BID  . sevelamer carbonate  1,600 mg Oral TID WC  . vancomycin  125 mg Oral QID  . Vitamin D (Ergocalciferol)  50,000 Units Oral Q Sat   Continuous Infusions:    LOS: 9 days   Time  spent: 33min  Augustus Zurawski C Irys Nigh, DO [facility]  If 7PM-7AM, please contact night-coverage [email or web address]  10/12/2019, 7:31 AM

## 2019-10-12 NOTE — Plan of Care (Signed)
  Problem: Education: Goal: Knowledge of disease or condition will improve Outcome: Progressing  Patient afebrile this shift, denies stools this morning, able to verbalize HD schedule.  Problem: Nutrition: Goal: Dietary intake will improve Outcome: Not Progressing  Patient with poor appetite, states he is too tired to eat today.

## 2019-10-12 NOTE — Therapy (Signed)
[place] [address] [place], [place], [address] Phone: [phone]   Fax:  [phone]  Patient Details  Name: Jorge [name] MRN: [id number] Date of Birth: Feb 26, 1970 Referring Provider:  No ref. provider found  Encounter Date: 10/12/2019   PHYSICAL THERAPY DISCHARGE SUMMARY  Visits from Start of Care: 2 (1 eval, 1 re-eval)  Current functional level related to goals / functional outcomes: Unsure as he was hospitalized for new CVA and is in inpatient rehab with recommendations for SNF at d/C.     Remaining deficits:  PT Short Term Goals - 09/28/19 1205      PT SHORT TERM GOAL #1   Title Pt will be indpendent with standing HEP in order to indicate improved balance/strength.  (Target Date: 10/28/19)    Time 4    Period Weeks    Status New    Target Date 10/28/19      PT SHORT TERM GOAL #2   Title Pt will improve gait speed to >/=2.62 ft/sec with LRAD in order to indicate dec fall risk.    Time 4    Period Weeks    Status Revised      PT SHORT TERM GOAL #3   Title Pt will improve BERG balance score to >/=30/56 in order to indicate improved balance.    Time 4    Period Weeks    Status Revised      PT SHORT TERM GOAL #4   Title Pt will negotiate up/down 8 steps with B rail at S level in order to indicate safety in home.    Time 4    Period Weeks    Status Revised      PT SHORT TERM GOAL #5   Title Pt will ambulate x 150' at mod I level with RW in order to indicate safe home ambulation.    Time 4    Period Weeks    Status Revised             Education / Equipment: n/a  Plan: Patient agrees to discharge.  Patient goals were not met. Patient is being discharged due to a change in medical status.  ?????          Cameron Sprang, PT, MPT [facility] [address] [place], [place], [address] Phone: [phone]   Fax:  [phone] 10/12/19, 10:51  AM

## 2019-10-12 NOTE — Plan of Care (Signed)
  Problem: Education: Goal: Knowledge of disease or condition will improve Outcome: Not Progressing Goal: Knowledge of secondary prevention will improve Outcome: Not Progressing Goal: Knowledge of patient specific risk factors addressed and post discharge goals established will improve Outcome: Not Progressing Goal: Individualized Educational Video(s) Outcome: Not Progressing   Problem: Coping: Goal: Will verbalize positive feelings about self Outcome: Not Progressing Goal: Will identify appropriate support needs Outcome: Not Progressing   Problem: Self-Care: Goal: Ability to participate in self-care as condition permits will improve Outcome: Not Progressing Goal: Verbalization of feelings and concerns over difficulty with self-care will improve Outcome: Not Progressing Goal: Ability to communicate needs accurately will improve Outcome: Not Progressing   Problem: Intracerebral Hemorrhage Tissue Perfusion: Goal: Complications of Intracerebral Hemorrhage will be minimized Outcome: Not Progressing   Problem: Spontaneous Subarachnoid Hemorrhage Tissue Perfusion: Goal: Complications of Spontaneous Subarachnoid Hemorrhage will be minimized Outcome: Not Progressing

## 2019-10-12 NOTE — TOC Progression Note (Signed)
Transition of Care [facility]) - Progression Note    Patient Details  Name: CAI ANFINSON MRN: [id number] Date of Birth: Dec 26, 1970  Transition of Care [facility]) CM/SW Contact  Joanne Chars, [place] Phone Number: 10/12/2019, 4:16 PM  Clinical Narrative:  CSW called following facilities still pending in the hub: [facility]-No dialysis beds [facility]--they did not receive referral, resent.  [place] reviewing. [facility]-LM [facility]-LM  CSW also spoke with pt sister about potentially revisiting CIR plan.  Her main concern is getting pt transferred to assisted living afterwards and would be open if this is possible.  LM with CIR.       Expected Discharge Plan: [place] Barriers to Discharge: Continued Medical Work up, SNF Pending bed offer  Expected Discharge Plan and Services Expected Discharge Plan: [place] Choice: [place] arrangements for the past 2 months: Single Family Home                                       Social Determinants of Health (SDOH) Interventions    Readmission Risk Interventions No flowsheet data found.

## 2019-10-12 NOTE — Consult Note (Signed)
   [facility] CM Inpatient Consult   10/12/2019  Caitlin ZAIRE LEVESQUE 1970/12/27 [id number]   [facility] [ACO] Patient:  [employer]   Patient screened for length of stay and  extreme high risk score for unplanned readmission score and for hospitalizations to check if potential [place] Management service needs.  Review of patient's medical record reveals patient is recommended by PT for CIR vs SNF. Currently, SNF is the pursuit of disposition.   Primary Care Provider is Dewaine Oats Carlos American, NP this provider is listed to provide the transition of care [TOC] for post hospital follow up.  Plan:  Continue to follow progress and disposition to assess for post hospital care management needs.  If patient's disposition is SNF, then care needs will be met at that level of care and will sign off.  For questions contact:   Natividad Brood, RN BSN [facility] Liaison  [phone] business mobile phone Toll free office [phone]  Fax number: [phone] [email or web address]

## 2019-10-12 NOTE — Progress Notes (Signed)
Physical Therapy Treatment Patient Details Name: Jorge [name] MRN: [id number] DOB: 18-Aug-1970 Today's Date: 10/12/2019    History of Present Illness 49 y.o. 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 Stat CT performed, consistent with 15 cc hematoma in the right putamen and adjacent white matter with a small volume subarachnoid extension locally.  Small remote left frontal cortex insult and small remote left cerebellar infarct.    PT Comments    Pt drowsy and flat with PT during session. Pt not interested in gait training this day, but agreed to scoot to recliner without prostheses donned and required mod assist to perform. RN notified that pt should don prostheses for back to bed, given difficulty with transfer during PT. Pt continuing to present with L inattention, requires multimodal cuing to attend to L. Will continue to follow acutely.     Follow Up Recommendations  SNF     Equipment Recommendations  Other (comment) (TBD)    Recommendations for Other Services       Precautions / Restrictions Precautions Precautions: Fall Precaution Comments: bilateral BKA Restrictions Weight Bearing Restrictions: No    Mobility  Bed Mobility Overal bed mobility: Needs Assistance Bed Mobility: Supine to Sit     Supine to sit: Mod assist     General bed mobility comments: Mod assist to elevate trunk and scoot to EOB, very increased time and effort to perform. Pt reluctant to accept PT assist.  Transfers Overall transfer level: Needs assistance Equipment used: 1 person hand held assist Transfers: Lateral/Scoot Transfers          Lateral/Scoot Transfers: Mod assist;From elevated surface General transfer comment: Pt declines donning prostheses, opts for  scoot to drop arm recliner towards pt's R side. Mod assist for step-wise scooting, coaching pt through leaning opposite direction of the way he is scooting to translate trunk, and blocking anteriorly to avoid anterior LOB. Very increased time and effort to perform.  Ambulation/Gait             General Gait Details: nt   Marine scientist Rankin (Stroke Patients Only) Modified Rankin (Stroke Patients Only) Pre-Morbid Rankin Score: Moderate disability Modified Rankin: Moderately severe disability     Balance Overall balance assessment: Needs assistance Sitting-balance support: No upper extremity supported;Feet supported Sitting balance-Leahy Scale: Fair Sitting balance - Comments: close supervision dynamically                                    Cognition Arousal/Alertness: Lethargic Behavior During Therapy: Flat affect Overall Cognitive Status: Impaired/Different from baseline Area of Impairment: Safety/judgement;Awareness;Problem solving;Attention                   Current Attention Level: Sustained   Following Commands: Follows multi-step commands inconsistently;Follows one step commands with increased time Safety/Judgement: Decreased awareness of safety;Decreased awareness of deficits Awareness: Emergent Problem Solving: Difficulty sequencing;Requires verbal cues;Slow processing;Decreased initiation;Requires tactile cues General Comments: Pt laying down with blanket pulled over head upon PT arrival to room, pt very flat with PT throughout session and responds monosyllabically. Pt remains impulsive, poor attention to L with LUE slipping off bed multiples times during EOB scooting  requiring PT assist to correct.      Exercises      General Comments General comments (skin integrity, edema, etc.): L visual field cut, L inattention      Pertinent Vitals/Pain Pain Assessment: Faces Faces Pain Scale: No  hurt Pain Intervention(s): Monitored during session    Home Living                      Prior Function            PT Goals (current goals can now be found in the care plan section) Acute Rehab PT Goals Patient Stated Goal: To return to independence PT Goal Formulation: With patient Time For Goal Achievement: 10/18/19 Potential to Achieve Goals: Good Progress towards PT goals: Progressing toward goals    Frequency    Min 3X/week      PT Plan Current plan remains appropriate    Co-evaluation              AM-PAC PT "6 Clicks" Mobility   Outcome Measure  Help needed turning from your back to your side while in a flat bed without using bedrails?: A Little Help needed moving from lying on your back to sitting on the side of a flat bed without using bedrails?: A Lot Help needed moving to and from a bed to a chair (including a wheelchair)?: A Lot Help needed standing up from a chair using your arms (e.g., wheelchair or bedside chair)?: A Little Help needed to walk in hospital room?: A Lot Help needed climbing 3-5 steps with a railing? : Total 6 Click Score: 13    End of Session   Activity Tolerance: Patient limited by fatigue Patient left: with call bell/phone within reach;in chair;with chair alarm set Nurse Communication: Mobility status;Other (comment) (should don prostheses for back to bed) PT Visit Diagnosis: Other abnormalities of gait and mobility (R26.89);Muscle weakness (generalized) (M62.81);Other symptoms and signs involving the nervous system (R29.898)     Time: 1459-1520 PT Time Calculation (min) (ACUTE ONLY): 21 min  Charges:  $Therapeutic Activity: 8-22 mins                    Cortlynn Hollinsworth E, PT Acute Rehabilitation Services Pager [phone]  Office [phone]   Kennth Vanbenschoten D Elonda Husky 10/12/2019, 3:51 PM

## 2019-10-12 NOTE — Progress Notes (Signed)
Reviewed case with Rapid response RN.  Patient remains easily roused and oriented but temp is 101.1 on recheck. MD notified.

## 2019-10-12 NOTE — Progress Notes (Signed)
[facility] Progress Note   Subjective:  Seen in room, very groggy. No distress.   Objective Vitals:   10/11/19 0804 10/11/19 1724 10/11/19 2133 10/12/19 0844  BP: 137/70 (!) 107/57 128/66 128/65  Pulse: 66 67 70 76  Resp: 16 18 19 17   Temp: 98 F (36.7 C) 98 F (36.7 C) 98.2 F (36.8 C) (!) 100.4 F (38 C)  TempSrc: Oral Oral Oral Oral  SpO2: 98% 99% 98% 95%  Weight:      Height:       Physical Exam General: Well appearing man, NAD Heart: RRR; no murmur Lungs: CTA anteriorly Abdomen: soft, non-tender Extremities: B BKA, no stump edema, L arm edema focal ? IV infiltration Dialysis Access: TDC in L chest, RUE AVF + thrill  Additional Objective Labs: Basic Metabolic Panel: Recent Labs  Lab 10/08/19 0551 10/09/19 1219 10/10/19 0730  NA 134* 135 136  K 5.1 4.8 5.1  CL 102 99 98  CO2 20* 24 26  GLUCOSE 91 106* 179*  BUN 51* 35* 52*  CREATININE 7.38* 6.18* 8.05*  CALCIUM 8.6* 8.8* 9.0  PHOS 4.1 3.8 4.7*   Liver Function Tests: Recent Labs  Lab 10/08/19 0551 10/09/19 1219 10/10/19 0730  ALBUMIN 2.8* 2.9* 2.7*   CBC: Recent Labs  Lab 10/06/19 0803 10/08/19 0825 10/10/19 0729  WBC 5.9 6.6 6.2  HGB 8.5* 8.5* 8.2*  HCT 28.9* 28.5* 27.5*  MCV 100.0 98.6 95.2  PLT 102* 127* 123*   Blood Culture    Component Value Date/Time   SDES BLOOD LEFT HAND 10/10/2019 0119   SPECREQUEST  10/10/2019 0119    BOTTLES DRAWN AEROBIC AND ANAEROBIC Blood Culture adequate volume   CULT  10/10/2019 0119    NO GROWTH 2 DAYS Performed at [facility], [place] [address], [place],  [address]    REPTSTATUS PENDING 10/10/2019 0119   Medications:  . amLODipine  10 mg Oral Daily  . atorvastatin  20 mg Oral QHS  . calcitRIOL  1 mcg Oral Q T,Th,Sa-HD  . carvedilol  12.5 mg Oral BID WC  . Chlorhexidine Gluconate Cloth  6 each Topical Q0600  . Chlorhexidine Gluconate Cloth  6 each Topical Q0600  . darbepoetin (ARANESP) injection - DIALYSIS  100 mcg  Intravenous Q Sat-HD  . feeding supplement (NEPRO CARB STEADY)  237 mL Oral BID BM  . furosemide  20 mg Oral Daily  . gabapentin  300 mg Oral Daily  . insulin aspart  0-6 Units Subcutaneous TID WC  . insulin glargine  5 Units Subcutaneous Daily  . multivitamin  1 tablet Oral QHS  . pantoprazole  40 mg Oral QHS  . senna-docusate  1 tablet Oral BID  . sevelamer carbonate  1,600 mg Oral TID WC  . vancomycin  125 mg Oral QID  . Vitamin D (Ergocalciferol)  50,000 Units Oral Q Sat    Dialysis:  [facility] 4h 68kg 2/2.5 bath  R BVT AVF (08/28/19 maturing) L IJ TDC Hep none   -  ok for heparin block to [facility], was on hep 4200u on hold due to bleed   - mircera 200 q 2 last 9/16 - venofer 100 x 3 more doses - calcitriol 1 ug tiw Recent labs: hgb 9 trending up tsat 18%  Assessment/Plan: 1.Hemorrhagic R brain CVA - per Neurology. No heparin with HD. Ok to use to lock [facility]. 2. ESRD- Continue HD per TTS sched - next HD tomorrow.  3. Anemiaof CKD-Hgb 8.2.  Last tsat 9%, on Venofer course + ESA. 4. Secondary hyperparathyroidism- Ca and phos in goal. Continue binders and VDRA - renvela decreased to 2AC TID due to initial low phos.  5.HTN/volume- s/p IV cleviprex. Per primary/stroke team in setting of CVA. Initial goal to keep SBP <140, now <160. On amlodipine 10mg , coreg 12.5 BID, lasix 20mg  and prn labetalol 6. Nutrition- Alb low, continue supplements 7.C diff colitis- per primary, on PO vanc 8.DMT2- per primary. On insulin 9. [name]- Plan is for SNF 10. AMS + recurrent fever (10/1): Back to baseline MS. Blood Cx drawn 10/1 - NGTD.  Kelly Splinter, MD 10/12/2019, 3:18 PM

## 2019-10-13 DIAGNOSIS — A0472 Enterocolitis due to Clostridium difficile, not specified as recurrent: Secondary | ICD-10-CM | POA: Diagnosis not present

## 2019-10-13 LAB — CBC
HCT: 26.6 % — ABNORMAL LOW (ref 39.0–52.0)
Hemoglobin: 8.1 g/dL — ABNORMAL LOW (ref 13.0–17.0)
MCH: 28.4 pg (ref 26.0–34.0)
MCHC: 30.5 g/dL (ref 30.0–36.0)
MCV: 93.3 fL (ref 80.0–100.0)
Platelets: 160 10*3/uL (ref 150–400)
RBC: 2.85 MIL/uL — ABNORMAL LOW (ref 4.22–5.81)
RDW: 15.2 % (ref 11.5–15.5)
WBC: 7.3 10*3/uL (ref 4.0–10.5)
nRBC: 0 % (ref 0.0–0.2)

## 2019-10-13 LAB — RENAL FUNCTION PANEL
Albumin: 2.6 g/dL — ABNORMAL LOW (ref 3.5–5.0)
Anion gap: 12 (ref 5–15)
BUN: 50 mg/dL — ABNORMAL HIGH (ref 6–20)
CO2: 23 mmol/L (ref 22–32)
Calcium: 9.2 mg/dL (ref 8.9–10.3)
Chloride: 98 mmol/L (ref 98–111)
Creatinine, Ser: 10.2 mg/dL — ABNORMAL HIGH (ref 0.61–1.24)
GFR calc non Af Amer: 5 mL/min — ABNORMAL LOW (ref >60)
Glucose, Bld: 127 mg/dL — ABNORMAL HIGH (ref 70–99)
Phosphorus: 3.8 mg/dL (ref 2.5–4.6)
Potassium: 6.6 mmol/L (ref 3.5–5.1)
Sodium: 133 mmol/L — ABNORMAL LOW (ref 135–145)

## 2019-10-13 LAB — GLUCOSE, CAPILLARY
Glucose-Capillary: 119 mg/dL — ABNORMAL HIGH (ref 70–99)
Glucose-Capillary: 104 mg/dL — ABNORMAL HIGH (ref 70–99)
Glucose-Capillary: 124 mg/dL — ABNORMAL HIGH (ref 70–99)
Glucose-Capillary: 126 mg/dL — ABNORMAL HIGH (ref 70–99)

## 2019-10-13 MED ORDER — HEPARIN SODIUM (PORCINE) 1000 UNIT/ML IJ SOLN
INTRAMUSCULAR | Status: AC
  Administered 2019-10-13: 1000 [IU]
  Filled 2019-10-13: qty 5

## 2019-10-13 MED ORDER — CALCITRIOL 0.25 MCG PO CAPS
ORAL_CAPSULE | ORAL | Status: AC
  Administered 2019-10-13: 1 ug via ORAL
  Filled 2019-10-13: qty 4

## 2019-10-13 NOTE — Progress Notes (Signed)
PROGRESS NOTE    Jorge [name]  YTK:[id number] DOB: Jan 30, 1970 DOA: 10/03/2019 PCP: Lauree Chandler, NP   Brief Narrative:  Jorge [name] is a 49 y.o. male past medical history of diabetes with gastroparesis, hypertension, BKA, ESRD on peritoneal dialysis, with no prior medical history of strokes, presented to the emergency room for sudden onset of left-sided weakness and confusion. Last known normal 9 PM on 10/02/2019.  Seen again by family this morning and speech was slurred.  Was complaining of a headache.  Appeared more drowsy to family.  EMS was called.  On EMS evaluation, exhibited symptoms of left-sided weakness and fast ED calculator showing an LVO score of 4 prompting activation of an LVO positive code stroke. Brought into the emergency room, evaluated at the bridge-NIH stroke scale of 11 consistent with a right hemispheric stroke.  Stat CT performed, consistent with 15 cc hematoma in the right putamen and adjacent white matter with a small volume subarachnoid extension locally.  Small remote left frontal cortex insult and small remote left cerebellar infarct. Upon arrival: Blood sugars in the 75T, systolic blood pressure 700F. Given D50 prior to CT.   Assessment & Plan: Active Problems:   ICH (intracerebral hemorrhage) ([name])   C. difficile diarrhea   Small remote left frontal cortex and small remote left cerebellar infarct. R putamen and adjacent white matter ICH w/ IVH & SAH  - As noted on CT head at admission - aspirin 81 mg daily prior to admission, currently holding all antiplatelet or thrombotic agents given hemorrhage  - defer to neurology when to resume - PT OT speech following, currently recommending CIR versus SNF  Hypertension - Currently well controlled on: amlodipine 10, coreg 12.5 bid, lasix 20, prn Labetalol    Hyperlipidemia - Statin resumed per neurology; lipitor 20  Diabetes type 2 controlled. - Hemoglobin 4.9, somewhat tight control prior to admit but  glucose somewhat elevated here - Unclear if simply poor PO intake prior to admit - Glargine (10u) recently restarted at 5 units QAM  ESRD dialysis Tuesday Thursday Saturday - Nephrology following, appreciate insight and recommendations  C. difficile positive, POA Fevers, low grade, ongoing  - Continue vancomycin per protocol through 10/15/19 - Diarrhea resolving appropriately  DVT prophylaxis: None given intracranial hemorrhage as above Code Status: Full Family Communication: Sister over the phone updated at length, requesting discharge to permanent facility, we discussed that this would likely not be possible and can only offer short-term rehab at this point.  Status is: Inpatient  [name]: The patient is from: Home              Anticipated d/c is to: SNF              Anticipated d/c date is: 24-48 hours pending clinical course and safe dispo location              Patient currently IS medically stable for discharge, simply awaiting for safe disposition location.  Currently the issue is patient is on Tuesday Thursday Saturday dialysis and SNF is unable to perform transport on the weekend requesting patient transition to Monday Wednesday Friday dialysis but the patient is currently refusing to do so.  Attempting to negotiate to have patient only temporarily on Monday Wednesday Friday dialysis during SNF stay and transition back to Tuesday Thursday Saturday at discharge.  Consultants:   Nephrology  Procedures:   None indicated  Antimicrobials:  Vancomycin  Subjective: Low-grade fever last night reported by nursing staff and  rapid response called due to increased somnolence but was arousable and A/O x4 at the time of evaluation at bedside.  Patient otherwise feels quite well, somewhat frustrated with prolonged hospitalization but we discussed that if he does not agree to at least temporarily change his dialysis days he might be here much longer as there are very few dialysis spots  available and even fewer nursing facility spots that will provide transport to dialysis.  Objective: Vitals:   10/12/19 1610 10/12/19 1831 10/12/19 2136 10/13/19 0333  BP: (!) 142/61  115/60 (!) 115/54  Pulse: 80  68 69  Resp: 17   18  Temp: (!) 100.8 F (38.2 C) (!) 101.1 F (38.4 C) 98.9 F (37.2 C) 99.2 F (37.3 C)  TempSrc: Oral Oral Oral Oral  SpO2: 97%  100% 96%  Weight:      Height:        Intake/Output Summary (Last 24 hours) at 10/13/2019 0757 Last data filed at 10/12/2019 2236 Gross per 24 hour  Intake 0 ml  Output --  Net 0 ml   Filed Weights   10/08/19 1104 10/10/19 0710 10/10/19 1045  Weight: 70.2 kg 70.8 kg 69.6 kg    Examination:  General:  Pleasantly resting in bed, No acute distress. HEENT:  Normocephalic atraumatic.  Sclerae nonicteric, noninjected.  Extraocular movements intact bilaterally. Neck:  Without mass or deformity.  Trachea is midline. Lungs:  Clear to auscultate bilaterally without rhonchi, wheeze, or rales. Heart:  Regular rate and rhythm.  Without murmurs, rubs, or gallops. Abdomen:  Soft, nontender, nondistended.  Without guarding or rebound. Extremities: Without cyanosis, clubbing, edema, bilateral BKA Vascular:  Dorsalis pedis and posterior tibial pulses palpable bilaterally. Skin:  Warm and dry, no erythema, no ulcerations.  Data Reviewed: I have personally reviewed following labs and imaging studies  CBC: Recent Labs  Lab 10/06/19 0803 10/08/19 0825 10/10/19 0729  WBC 5.9 6.6 6.2  HGB 8.5* 8.5* 8.2*  HCT 28.9* 28.5* 27.5*  MCV 100.0 98.6 95.2  PLT 102* 127* 024*   Basic Metabolic Panel: Recent Labs  Lab 10/06/19 0803 10/08/19 0551 10/09/19 1219 10/10/19 0730  NA 136 134* 135 136  K 4.4 5.1 4.8 5.1  CL 102 102 99 98  CO2 22 20* 24 26  GLUCOSE 132* 91 106* 179*  BUN 41* 51* 35* 52*  CREATININE 7.70* 7.38* 6.18* 8.05*  CALCIUM 8.4* 8.6* 8.8* 9.0  PHOS 4.1 4.1 3.8 4.7*   GFR: Estimated Creatinine Clearance: 10.7  mL/min (A) (by C-G formula based on SCr of 8.05 mg/dL (H)). Liver Function Tests: Recent Labs  Lab 10/06/19 0803 10/08/19 0551 10/09/19 1219 10/10/19 0730  ALBUMIN 2.7* 2.8* 2.9* 2.7*   No results for input(s): LIPASE, AMYLASE in the last 168 hours. No results for input(s): AMMONIA in the last 168 hours. Coagulation Profile: No results for input(s): INR, PROTIME in the last 168 hours. Cardiac Enzymes: No results for input(s): CKTOTAL, CKMB, CKMBINDEX, TROPONINI in the last 168 hours. BNP (last 3 results) No results for input(s): PROBNP in the last 8760 hours. HbA1C: No results for input(s): HGBA1C in the last 72 hours. CBG: Recent Labs  Lab 10/12/19 0746 10/12/19 1219 10/12/19 1644 10/12/19 2103 10/13/19 0712  GLUCAP 182* 131* 87 72 119*   Lipid Profile: No results for input(s): CHOL, HDL, LDLCALC, TRIG, CHOLHDL, LDLDIRECT in the last 72 hours. Thyroid Function Tests: No results for input(s): TSH, T4TOTAL, FREET4, T3FREE, THYROIDAB in the last 72 hours. Anemia Panel: No results for  input(s): VITAMINB12, FOLATE, FERRITIN, TIBC, IRON, RETICCTPCT in the last 72 hours. Sepsis Labs: No results for input(s): PROCALCITON, LATICACIDVEN in the last 168 hours.  Recent Results (from the past 240 hour(s))  Respiratory Panel by RT PCR (Flu A&B, Covid) - Nasopharyngeal Swab     Status: None   Collection Time: 10/03/19 10:00 AM   Specimen: Nasopharyngeal Swab  Result Value Ref Range Status   SARS Coronavirus 2 by RT PCR NEGATIVE NEGATIVE Final    Comment: (NOTE) SARS-CoV-2 target nucleic acids are NOT DETECTED.  The SARS-CoV-2 RNA is generally detectable in upper respiratoy specimens during the acute phase of infection. The lowest concentration of SARS-CoV-2 viral copies this assay can detect is 131 copies/mL. A negative result does not preclude SARS-Cov-2 infection and should not be used as the sole basis for treatment or other patient management decisions. A negative result  may occur with  improper specimen collection/handling, submission of specimen other than nasopharyngeal swab, presence of viral mutation(s) within the areas targeted by this assay, and inadequate number of viral copies (<131 copies/mL). A negative result must be combined with clinical observations, patient history, and epidemiological information. The expected result is Negative.  Fact Sheet for Patients:  PinkCheek.be  Fact Sheet for Healthcare Providers:  GravelBags.it  This test is no t yet approved or cleared by the [place] FDA and  has been authorized for detection and/or diagnosis of SARS-CoV-2 by FDA under an Emergency Use Authorization (EUA). This EUA will remain  in effect (meaning this test can be used) for the duration of the COVID-19 declaration under Section 564(b)(1) of the Act, 21 U.S.C. section 360bbb-3(b)(1), unless the authorization is terminated or revoked sooner.     Influenza A by PCR NEGATIVE NEGATIVE Final   Influenza B by PCR NEGATIVE NEGATIVE Final    Comment: (NOTE) The Xpert Xpress SARS-CoV-2/FLU/RSV assay is intended as an aid in  the diagnosis of influenza from Nasopharyngeal swab specimens and  should not be used as a sole basis for treatment. Nasal washings and  aspirates are unacceptable for Xpert Xpress SARS-CoV-2/FLU/RSV  testing.  Fact Sheet for Patients: PinkCheek.be  Fact Sheet for Healthcare Providers: GravelBags.it  This test is not yet approved or cleared by the [place] FDA and  has been authorized for detection and/or diagnosis of SARS-CoV-2 by  FDA under an Emergency Use Authorization (EUA). This EUA will remain  in effect (meaning this test can be used) for the duration of the  Covid-19 declaration under Section 564(b)(1) of the Act, 21  U.S.C. section 360bbb-3(b)(1), unless the authorization is  terminated  or revoked. Performed at [facility], [place] [address], [place], [place] [address]   MRSA PCR Screening     Status: None   Collection Time: 10/03/19  2:57 PM   Specimen: Nasal Mucosa; Nasopharyngeal  Result Value Ref Range Status   MRSA by PCR NEGATIVE NEGATIVE Final    Comment:        The GeneXpert MRSA Assay (FDA approved for NASAL specimens only), is one component of a comprehensive MRSA colonization surveillance program. It is not intended to diagnose MRSA infection nor to guide or monitor treatment for MRSA infections. Performed at [facility], [place] [address], [place], [place] [address]   C Difficile Quick Screen w PCR reflex     Status: Abnormal   Collection Time: 10/05/19  9:41 PM   Specimen: STOOL  Result Value Ref Range Status   C Diff antigen POSITIVE (  A) NEGATIVE Final   C Diff toxin NEGATIVE NEGATIVE Final   C Diff interpretation Results are indeterminate. See PCR results.  Final    Comment: Performed at [facility], [place] [address] [place], [place] [address]  C. Diff by PCR, Reflexed     Status: Abnormal   Collection Time: 10/05/19  9:41 PM  Result Value Ref Range Status   Toxigenic C. Difficile by PCR POSITIVE (A) NEGATIVE Final    Comment: Positive for toxigenic C. difficile with little to no toxin production. Only treat if clinical presentation suggests symptomatic illness. Performed at [facility], [place] [address], [place], [place] [address]   SARS Coronavirus 2 by RT PCR (hospital order, performed in [facility] hospital lab) Nasopharyngeal Nasopharyngeal Swab     Status: None   Collection Time: 10/08/19  2:33 PM   Specimen: Nasopharyngeal Swab  Result Value Ref Range Status   SARS Coronavirus 2 NEGATIVE NEGATIVE Final    Comment: (NOTE) SARS-CoV-2 target nucleic acids are NOT DETECTED.  The SARS-CoV-2 RNA is generally detectable in upper and lower respiratory specimens during the acute phase of infection. The  lowest concentration of SARS-CoV-2 viral copies this assay can detect is 250 copies / mL. A negative result does not preclude SARS-CoV-2 infection and should not be used as the sole basis for treatment or other patient management decisions.  A negative result may occur with improper specimen collection / handling, submission of specimen other than nasopharyngeal swab, presence of viral mutation(s) within the areas targeted by this assay, and inadequate number of viral copies (<250 copies / mL). A negative result must be combined with clinical observations, patient history, and epidemiological information.  Fact Sheet for Patients:   StrictlyIdeas.no  Fact Sheet for Healthcare Providers: BankingDealers.co.za  This test is not yet approved or  cleared by the [place] FDA and has been authorized for detection and/or diagnosis of SARS-CoV-2 by FDA under an Emergency Use Authorization (EUA).  This EUA will remain in effect (meaning this test can be used) for the duration of the COVID-19 declaration under Section 564(b)(1) of the Act, 21 U.S.C. section 360bbb-3(b)(1), unless the authorization is terminated or revoked sooner.  Performed at [facility], [place] [address] [place], [place] [address]   Culture, blood (routine x 2)     Status: None (Preliminary result)   Collection Time: 10/10/19  1:14 AM   Specimen: BLOOD  Result Value Ref Range Status   Specimen Description BLOOD LEFT ARM  Final   Special Requests   Final    BOTTLES DRAWN AEROBIC AND ANAEROBIC Blood Culture adequate volume   Culture   Final    NO GROWTH 2 DAYS Performed at [facility], [place] [address] [place], [place] [address]    Report Status PENDING  Incomplete  Culture, blood (routine x 2)     Status: None (Preliminary result)   Collection Time: 10/10/19  1:19 AM   Specimen: BLOOD  Result Value Ref Range Status   Specimen Description BLOOD LEFT HAND   Final   Special Requests   Final    BOTTLES DRAWN AEROBIC AND ANAEROBIC Blood Culture adequate volume   Culture   Final    NO GROWTH 2 DAYS Performed at [facility], [place] [address] [place], [place] [address]    Report Status PENDING  Incomplete    Radiology Studies: No results found.  Scheduled Meds: . amLODipine  10 mg Oral Daily  .  atorvastatin  20 mg Oral QHS  . calcitRIOL  1 mcg Oral Q T,Th,Sa-HD  . carvedilol  12.5 mg Oral BID WC  . Chlorhexidine Gluconate Cloth  6 each Topical Q0600  . Chlorhexidine Gluconate Cloth  6 each Topical Q0600  . darbepoetin (ARANESP) injection - DIALYSIS  100 mcg Intravenous Q Sat-HD  . feeding supplement (NEPRO CARB STEADY)  237 mL Oral BID BM  . furosemide  20 mg Oral Daily  . gabapentin  300 mg Oral Daily  . insulin aspart  0-6 Units Subcutaneous TID WC  . insulin glargine  5 Units Subcutaneous Daily  . multivitamin  1 tablet Oral QHS  . pantoprazole  40 mg Oral QHS  . senna-docusate  1 tablet Oral BID  . sevelamer carbonate  1,600 mg Oral TID WC  . vancomycin  125 mg Oral QID  . Vitamin D (Ergocalciferol)  50,000 Units Oral Q Sat   Continuous Infusions:    LOS: 10 days   Time spent: 84min  Ercell Perlman C Marianita Botkin, DO [facility]  If 7PM-7AM, please contact night-coverage [email or web address]  10/13/2019, 7:57 AM

## 2019-10-13 NOTE — Progress Notes (Signed)
[facility] Progress Note   Subjective:  Seen on HD, no c/o today  Objective Vitals:   10/13/19 1100 10/13/19 1130 10/13/19 1200 10/13/19 1205  BP: 117/66 105/66 110/76 (P) 128/66  Pulse: 71 70 70 (P) 70  Resp: 15 18 18  (P) 18  Temp:    (P) 99 F (37.2 C)  TempSrc:    (P) Oral  SpO2: 100% 100% 100% (P) 100%  Weight:    (P) 68.5 kg  Height:       Physical Exam General: Well appearing man, NAD Heart: RRR; no murmur Lungs: CTA anteriorly Abdomen: soft, non-tender Extremities: B BKA, no stump edema, L arm edema 2+ Dialysis Access: TDC in L chest, RUE AVF + thrill  Additional Objective Labs: Basic Metabolic Panel: Recent Labs  Lab 10/09/19 1219 10/10/19 0730 10/13/19 0834  NA 135 136 133*  K 4.8 5.1 6.6*  CL 99 98 98  CO2 24 26 23   GLUCOSE 106* 179* 127*  BUN 35* 52* 50*  CREATININE 6.18* 8.05* 10.20*  CALCIUM 8.8* 9.0 9.2  PHOS 3.8 4.7* 3.8   Liver Function Tests: Recent Labs  Lab 10/09/19 1219 10/10/19 0730 10/13/19 0834  ALBUMIN 2.9* 2.7* 2.6*   CBC: Recent Labs  Lab 10/08/19 0825 10/10/19 0729 10/13/19 0835  WBC 6.6 6.2 7.3  HGB 8.5* 8.2* 8.1*  HCT 28.5* 27.5* 26.6*  MCV 98.6 95.2 93.3  PLT 127* 123* 160   Blood Culture    Component Value Date/Time   SDES BLOOD LEFT HAND 10/10/2019 0119   SPECREQUEST  10/10/2019 0119    BOTTLES DRAWN AEROBIC AND ANAEROBIC Blood Culture adequate volume   CULT  10/10/2019 0119    NO GROWTH 3 DAYS Performed at [facility], [address], [place], [place] [address]    REPTSTATUS PENDING 10/10/2019 0119   Medications:  . amLODipine  10 mg Oral Daily  . atorvastatin  20 mg Oral QHS  . calcitRIOL  1 mcg Oral Q T,Th,Sa-HD  . carvedilol  12.5 mg Oral BID WC  . Chlorhexidine Gluconate Cloth  6 each Topical Q0600  . darbepoetin (ARANESP) injection - DIALYSIS  100 mcg Intravenous Q Sat-HD  . feeding supplement (NEPRO CARB STEADY)  237 mL Oral BID BM  . furosemide  20 mg Oral Daily  .  gabapentin  300 mg Oral Daily  . insulin aspart  0-6 Units Subcutaneous TID WC  . insulin glargine  5 Units Subcutaneous Daily  . multivitamin  1 tablet Oral QHS  . pantoprazole  40 mg Oral QHS  . senna-docusate  1 tablet Oral BID  . sevelamer carbonate  1,600 mg Oral TID WC  . vancomycin  125 mg Oral QID  . Vitamin D (Ergocalciferol)  50,000 Units Oral Q Sat    Dialysis:  [facility] 4h 68kg 2/2.5 bath  R BVT AVF (08/28/19 maturing) L IJ TDC Hep none   -  ok for heparin block to [facility], was on hep 4200u on hold due to bleed   - mircera 200 q 2 last 9/16 - venofer 100 x 3 more doses - calcitriol 1 ug tiw Recent labs: hgb 9 trending up tsat 18%  Assessment/Plan: 1.Hemorrhagic R brain CVA - per Neurology. No heparin with HD. Ok to use to lock [facility]. 2. ESRD- Continue HD per TTS sched. HD today 3. Anemiaof CKD-Hgb 8.2. Last tsat 9%, on Venofer course + ESA. 4. Secondary hyperparathyroidism- Ca and phos in goal. Continue binders and VDRA -  renvela decreased to 2AC TID due to initial low phos.  5.HTN/volume- s/p IV cleviprex. Per primary/stroke team in setting of CVA. Initial goal to keep SBP <140, now <160. On amlodipine 10mg , coreg 12.5 BID, lasix 20mg  and prn labetalol. At dry wt post HD today.  6. Nutrition- Alb low, continue supplements 7.C diff colitis- per primary, on PO vanc 8.DMT2- per primary. On insulin 9. [name]- Plan is for SNF 10. AMS + recurrent fever (10/1): Back to baseline MS. Blood Cx drawn 10/1 - NGTD.  Kelly Splinter, MD 10/13/2019, 1:13 PM

## 2019-10-13 NOTE — Progress Notes (Signed)
Patient off unit in dialysis prior to morning assessment and med pass. Will assess patient upon his return to unit and administer appropriate medications.

## 2019-10-13 NOTE — TOC Progression Note (Signed)
Transition of Care [facility]) - Progression Note    Patient Details  Name: Jorge [name] MRN: [id number] Date of Birth: 11-18-1970  Transition of Care [facility]) CM/SW Contact  Joanne Chars, LCSW Phone Number: 10/13/2019, 1:20 PM  Clinical Narrative:   CSW spoke with [place] at [facility], who cannot accept pt.  LM for [facility].  Dedra at [facility] could take pt if his dialysys days could be switched to Mon-Wed-Fri.  CSW spoke with Terri Piedra who will see if she can find a clinic who could do this.  CSW spoke with [facility] again and they cannot take pt.  CSW also LM with Pamala Hurry at [facility] to get more information on ALF placement with pt current medicaid.     Expected Discharge Plan: [place] Barriers to Discharge: Continued Medical Work up, SNF Pending bed offer  Expected Discharge Plan and Services Expected Discharge Plan: [place] Choice: [place] arrangements for the past 2 months: Single Family Home                                       Social Determinants of Health (SDOH) Interventions    Readmission Risk Interventions No flowsheet data found.

## 2019-10-14 ENCOUNTER — Ambulatory Visit: Payer: Medicare Other | Admitting: Physical Therapy

## 2019-10-14 DIAGNOSIS — A0472 Enterocolitis due to Clostridium difficile, not specified as recurrent: Secondary | ICD-10-CM | POA: Diagnosis not present

## 2019-10-14 LAB — GLUCOSE, CAPILLARY
Glucose-Capillary: 129 mg/dL — ABNORMAL HIGH (ref 70–99)
Glucose-Capillary: 158 mg/dL — ABNORMAL HIGH (ref 70–99)
Glucose-Capillary: 180 mg/dL — ABNORMAL HIGH (ref 70–99)
Glucose-Capillary: 146 mg/dL — ABNORMAL HIGH (ref 70–99)

## 2019-10-14 MED ORDER — SEVELAMER CARBONATE 800 MG PO TABS
1600.0000 mg | ORAL_TABLET | Freq: Three times a day (TID) | ORAL | 0 refills | Status: DC
Start: 2019-10-14 — End: 2020-07-29

## 2019-10-14 MED ORDER — PANTOPRAZOLE SODIUM 40 MG PO TBEC
40.0000 mg | DELAYED_RELEASE_TABLET | Freq: Every day | ORAL | 0 refills | Status: DC
Start: 2019-10-14 — End: 2020-05-09

## 2019-10-14 MED ORDER — LANTUS SOLOSTAR 100 UNIT/ML ~~LOC~~ SOPN: 5.0000 [IU] | PEN_INJECTOR | Freq: Every day | SUBCUTANEOUS | 0 refills | Status: DC

## 2019-10-14 MED ORDER — VANCOMYCIN 50 MG/ML ORAL SOLUTION: 125.0000 mg | Freq: Four times a day (QID) | ORAL | 0 refills | Status: AC

## 2019-10-14 NOTE — Progress Notes (Signed)
Occupational Therapy Treatment Patient Details Name: Jorge [name] MRN: [id number] DOB: [birth year] Today's Date: 10/14/2019    History of present illness 49 y.o. 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 Stat CT performed, consistent with 15 cc hematoma in the right putamen and adjacent white matter with a small volume subarachnoid extension locally.  Small remote left frontal cortex insult and small remote left cerebellar infarct.   OT comments  Pt seen for OT follow up session with focus on ADL mobility progression with incorporation of LUE functionally in tasks. Pt completed bed mobility with min A for truncal support due to mild L posterior lateral lean. While sitting EOB pt donned tank top with set up A and pants with min guard (first with lateral leans then standing with RW to pull up over hips). Pt did have some notable difficulty donning L prosthesis this date, requiring some assist (suspect due to fit, pt was able to model how practice putting them on without external assist). Continues to show cognitive deficits in safety and awareness of deficits. Updated recs to SNF due CIR denial. Will continue to follow.   Follow Up Recommendations  SNF;Supervision/Assistance - 24 hour    Equipment Recommendations  None recommended by OT    Recommendations for Other Services      Precautions / Restrictions Precautions Precautions: Fall Precaution Comments: bilateral BKA Restrictions Weight Bearing Restrictions: No       Mobility Bed Mobility Overal bed mobility: Needs Assistance Bed Mobility: Supine to Sit     Supine to sit: Min assist     General bed mobility comments: general min A to maintain upright posture EOB. Pt sitting EOB for dressing tasks and  having L posterior- lateral lean  Transfers Overall transfer level: Needs assistance Equipment used: Rolling walker (2 wheeled) Transfers: Sit to/from Stand Sit to Stand: Min assist;From elevated surface         General transfer comment: min A to rise and steady with RW and to ensure safety with proper fight/alignment of bil prosthetics.    Balance Overall balance assessment: Needs assistance Sitting-balance support: No upper extremity supported Sitting balance-Leahy Scale: Fair Sitting balance - Comments: close supervision dynamically with LB dressing Postural control: Posterior lean;Left lateral lean Standing balance support: Bilateral upper extremity supported;During functional activity;No upper extremity supported Standing balance-Leahy Scale: Poor Standing balance comment: reliant on external support                           ADL either performed or assessed with clinical judgement   ADL Overall ADL's : Needs assistance/impaired                 Upper Body Dressing : Set up;Sitting Upper Body Dressing Details (indicate cue type and reason): to don tank top while seated EOB. Pt able to use L hand in bimaual tasks with continued noted decr in strength/awareness Lower Body Dressing: Sitting/lateral leans;Min guard Lower Body Dressing Details (indicate cue type and reason): set up with sweat pants sitting EOB. Pt able to lateral lean then stand with RW to pull pants up over hips Toilet Transfer: Minimal Agricultural engineer Details (indicate cue type and reason): for navigation of obstacles and managing L environment safely  Functional mobility during ADLs: Minimal assistance;Cueing for safety;Rolling walker General ADL Comments: pt able to progress well with ADLs this session     Vision Baseline Vision/History: No visual deficits Patient Visual Report: No change from baseline Additional Comments: continues to  have L sided deficits with vision/attention   Perception     Praxis      Cognition Arousal/Alertness: Awake/alert Behavior During Therapy: Flat affect Overall Cognitive Status: Impaired/Different from baseline Area of Impairment: Safety/judgement;Awareness;Problem solving;Attention                   Current Attention Level: Sustained   Following Commands: Follows multi-step commands inconsistently;Follows one step commands with increased time Safety/Judgement: Decreased awareness of safety;Decreased awareness of deficits Awareness: Emergent Problem Solving: Difficulty sequencing;Requires verbal cues;Slow processing;Decreased initiation;Requires tactile cues General Comments: Continues to require cues for safety and awarenss in environment with tasks. Pt leans to L without awareness and has difficulty navigating with RW without realizing he is bumping into objects. Mildly irriatble about situation        Exercises     Shoulder Instructions       General Comments      Pertinent Vitals/ Pain       Pain Assessment: No/denies pain Faces Pain Scale: No hurt Pain Intervention(s): Limited activity within patient's tolerance  Home Living                                          Prior Functioning/Environment              Frequency  Min 2X/week        Progress Toward Goals  OT Goals(current goals can now be found in the care plan section)  Progress towards OT goals: Progressing toward goals  Acute Rehab OT Goals Patient Stated Goal: To return to independence OT Goal Formulation: With patient Time For Goal Achievement: 10/19/19 Potential to Achieve Goals: Good  Plan Frequency remains appropriate;Discharge plan needs to be updated    Co-evaluation                 AM-PAC OT "6 Clicks" Daily Activity     Outcome Measure   Help from another person eating meals?: A Little Help from another person taking care of personal grooming?:  A Little Help from another person toileting, which includes using toliet, bedpan, or urinal?: A Little Help from another person bathing (including washing, rinsing, drying)?: A Little Help from another person to put on and taking off regular upper body clothing?: A Little Help from another person to put on and taking off regular lower body clothing?: A Little 6 Click Score: 18    End of Session Equipment Utilized During Treatment: Gait belt;Rolling walker  OT Visit Diagnosis: Unsteadiness on feet (R26.81);Other abnormalities of gait and mobility (R26.89);Muscle weakness (generalized) (M62.81);Other symptoms and signs involving the nervous system (R29.898);Other symptoms and signs involving cognitive function;Hemiplegia and hemiparesis Hemiplegia - Right/Left: Left Hemiplegia - dominant/non-dominant: Dominant Hemiplegia - caused by: Nontraumatic intracerebral hemorrhage   Activity Tolerance Patient tolerated treatment well   Patient Left Other (comment) (in hallway with PT)   Nurse Communication Mobility status        Time: 3557-3220 OT Time Calculation (min): 16 min  Charges: OT General Charges $OT Visit: 1 Visit OT Treatments $Self Care/Home Management : 8-22 mins  Zenovia Jarred, MSOT, OTR/L Acute Rehabilitation Services [facility] Office Number:  [phone] Pager: [place] 10/14/2019, 2:48 PM

## 2019-10-14 NOTE — Discharge Summary (Signed)
Physician Discharge Summary  Jorge [name]:[id number] DOB: [birth year] DOA: 10/03/2019  PCP: Jorge Chandler, NP  Admit date: 10/03/2019 Discharge date: 10/14/2019  Admitted From: Home Disposition: SNF  Recommendations for Outpatient Follow-up:  1. Follow up with PCP in 1-2 weeks 2. Follow-up with nephrology and continue dialysis as scheduled  Discharge Condition: Stable CODE STATUS: Full Diet recommendation: Renal diet  Brief/Interim Summary: Jorge D Davisis a 49 y.o.malepast medical history of diabetes with gastroparesis, hypertension, BKA, ESRD on peritoneal dialysis, with no prior medical history of strokes, presented to the emergency room for sudden onset of left-sided weakness and confusion. Last known normal 9 PM on 10/02/2019. Seen again by family this morning and speech was slurred. Was complaining of a headache. Appeared more drowsy to family. EMS was called. On EMS evaluation, exhibited symptoms of left-sided weakness and fast ED calculator showing an LVO score of 4 prompting activation of an LVO positive code stroke. Brought into the emergency room, evaluated at the bridge-NIH stroke scale of 11 consistent with a right hemispheric stroke. Stat CT performed, consistent with 15 cc hematoma in the right putamen and adjacent white matter with a small volume subarachnoid extension locally. Small remote left frontal cortex insult and small remote left cerebellar infarct. Upon arrival: Blood sugars in the 17P, systolic blood pressure 102H. Given D50 prior to CT.  She admitted as above with left-sided weakness and confusion, left frontal cortex and small left cerebellar infarcts with small intracranial as well as subarachnoid hemorrhages.  He recovered quite well, PT continues to recommend SNF placement, he is otherwise stable and agreeable for SNF, he had been diagnosed with C. difficile, will complete his vancomycin through 10/15/2019 with essentially no ongoing diarrhea or  loose stool.  At this point the issue for discharge is that patient is on a Tuesday Thursday Saturday dialysis schedule and SNF cannot provide transport on Saturday thus we are attempting to switch patient to Monday Wednesday Friday schedule with hopes to improve compliance with dialysis at facility.   Patient has been reluctant thus far to switch dialysis schedules, but is agreeable today on 10/14/2019 to transition to Monday Wednesday Friday for discharge purposes.  We will follow up with case management and social worker in hopes to obtain SNF placement in the next 24 to 48 hours.  Patient is medically stable for discharge.  Discharge Diagnoses:  Active Problems:   ICH (intracerebral hemorrhage) ([name])   C. difficile diarrhea   Discharge Instructions  Discharge Instructions    Ambulatory referral to Neurology   Complete by: As directed    Follow up with stroke clinic NP (Jorge [name] or Jorge [name], if both not available, consider Jorge [name], or Jorge [name]) at [facility] in about 4 weeks. Thanks.     Allergies as of 10/14/2019      Reactions   Lactose Intolerance (gi) Diarrhea, Nausea Only      Medication List    STOP taking these medications   cloNIDine 0.1 MG tablet Commonly known as: CATAPRES   oxyCODONE-acetaminophen 5-325 MG tablet Commonly known as: PERCOCET/ROXICET     TAKE these medications   amLODipine 10 MG tablet Commonly known as: NORVASC Take 10 mg by mouth daily.   aspirin EC 81 MG tablet Take 81 mg by mouth daily.   atorvastatin 20 MG tablet Commonly known as: LIPITOR TAKE 1 TABLET(20 MG) BY MOUTH DAILY What changed: See the new instructions.   calcitRIOL 0.5 MCG capsule Commonly known as: ROCALTROL Take 1 capsule (0.5  mcg total) by mouth 3 (three) times a week.   carvedilol 12.5 MG tablet Commonly known as: COREG Take 1 tablet (12.5 mg total) by mouth 2 (two) times daily with a meal.   Darbepoetin Alfa 200 MCG/0.4ML Sosy injection Commonly  known as: ARANESP Inject 0.4 mLs (200 mcg total) into the skin every Thursday at [place] [facility] Check blood sugar twice daily as directed DX E11.9 What changed:   how much to take  how to take this  when to take this   Dexcom G6 Sensor Misc Check blood sugar twice daily as directed DX E11.9 What changed:   how much to take  how to take this  when to take this   Dexcom G6 Transmitter Misc Check blood sugar twice daily as directed DX E11.9 What changed:   how much to take  how to take this  when to take this   furosemide 20 MG tablet Commonly known as: LASIX Take 20 mg by mouth daily.   gabapentin 300 MG capsule Commonly known as: NEURONTIN Take 1 capsule (300 mg total) by mouth daily as needed. What changed:   when to take this  reasons to take this   gentamicin cream 0.1 % Commonly known as: GARAMYCIN Apply 1 application topically daily.   Lantus SoloStar 100 UNIT/ML Solostar Pen Generic drug: insulin glargine Inject 5 Units into the skin daily. What changed: See the new instructions.   Loperamide HCl 1 MG/7.5ML Liqd Take 10 mLs by mouth every 6 (six) hours as needed for diarrhea or loose stools.   metoCLOPramide 10 MG tablet Commonly known as: REGLAN Take 1 tablet (10 mg total) by mouth every 6 (six) hours as needed for nausea or vomiting.   NovoLOG FlexPen 100 UNIT/ML FlexPen Generic drug: insulin aspart INJECT 5 UNITS INTO SKIN 3 TIMES DAILY WITH MEALS What changed: See the new instructions.   ondansetron 4 MG disintegrating tablet Commonly known as: Zofran ODT Take 1 tablet (4 mg total) by mouth every 8 (eight) hours as needed for vomiting (When you are actively vomiting and unable to tolerate Reglan).   pantoprazole 40 MG tablet Commonly known as: PROTONIX Take 1 tablet (40 mg total) by mouth at bedtime.   Pen Needles 30G X 8 MM Misc 1 each by Does not apply route in the morning, at noon, in the evening, and at  bedtime.   ProRenal + D Tabs Take 1 tablet by mouth daily.   sevelamer carbonate 800 MG tablet Commonly known as: RENVELA Take 2 tablets (1,600 mg total) by mouth 3 (three) times daily with meals. What changed: how much to take   vancomycin 50 mg/mL  oral solution Commonly known as: VANCOCIN Take 2.5 mLs (125 mg total) by mouth 4 (four) times daily for 1 day.   Vitamin D (Ergocalciferol) 1.25 MG (50000 UNIT) Caps capsule Commonly known as: DRISDOL Take 50,000 Units by mouth once a week. saturdays       Follow-up Information    [facility]. Schedule an appointment as soon as possible for a visit in 4 week(s).   Specialty: Neurology Contact information: [place] [phone]             Allergies  Allergen Reactions  . Lactose Intolerance (Gi) Diarrhea and Nausea Only    Consultations:  Nephrology   Procedures/Studies: CT ANGIO HEAD W OR WO CONTRAST  Result Date: 10/04/2019 CLINICAL DATA:  CTA head  and neck. EXAM: CT ANGIOGRAPHY HEAD AND NECK TECHNIQUE: Multidetector CT imaging of the head and neck was performed using the standard protocol during bolus administration of intravenous contrast. Multiplanar CT image reconstructions and MIPs were obtained to evaluate the vascular anatomy. Carotid stenosis measurements (when applicable) are obtained utilizing NASCET criteria, using the distal internal carotid diameter as the denominator. CONTRAST:  63mL OMNIPAQUE IOHEXOL 350 MG/ML SOLN COMPARISON:  10/03/2019 head CTs. FINDINGS: CT HEAD FINDINGS Brain: 3.3 x 3.1 cm intraparenchymal hemorrhage within the right putamen and external capsule is slightly less conspicuous than prior exam, previously 3.5 x 3.2 cm. Minimal intraventricular extension involving the right lateral ventricle is unchanged. Left insular subarachnoid hemorrhage is unchanged. Remote left frontal white matter insult. No mass lesion. No midline  shift, ventriculomegaly or extra-axial fluid collection. Vascular: Please see CTA. Skull: Negative for fracture or focal lesion. Sinuses/Orbits: Normal orbits. Clear paranasal sinuses. No mastoid effusion. Other: None. Review of the MIP images confirms the above findings CTA NECK FINDINGS Aortic arch: Standard branching. Mild aortic arch atherosclerotic calcifications. Imaged portion shows no evidence of aneurysm or dissection. No significant stenosis of the major arch vessel origins. Right carotid system: No evidence of dissection, stenosis (50% or greater) or occlusion. Proximal CCA atherosclerotic calcifications with 20% luminal narrowing. Carotid bifurcation atherosclerotic calcifications. Left carotid system: No evidence of dissection, stenosis (50% or greater) or occlusion. Circumferential proximal CCA atherosclerotic calcifications with 30% luminal narrowing. Carotid bifurcation atherosclerotic calcifications with approximately 40% narrowing of the proximal ICA. Vertebral arteries: Codominant. No evidence of dissection, stenosis (50% or greater) or occlusion. Skeleton: No acute or suspicious osseous abnormalities. Other neck: No adenopathy.  No soft tissue mass. Upper chest: Dependent atelectasis. Prominent to mildly enlarged superior mediastinal nodes. Review of the MIP images confirms the above findings CTA HEAD FINDINGS Anterior circulation: Bilateral carotid siphon atherosclerotic calcifications without significant narrowing of the intracavernous segments. No significant stenosis, proximal occlusion, aneurysm, or vascular malformation. Posterior circulation: Codominant vertebral arteries. Fetal origin of the bilateral PCAs. No significant stenosis, proximal occlusion, aneurysm, or vascular malformation. Venous sinuses: As permitted by contrast timing, patent. Anatomic variants: Detailed above. Review of the MIP images confirms the above findings IMPRESSION: CT head: Right putamen/external capsule  intraparenchymal hematoma measuring 3.3 cm, less conspicuous than prior exam. Trace intraventricular hemorrhage involving the right lateral ventricle is unchanged. Left insular subarachnoid hemorrhage, unchanged. CTA head and neck: 40% proximal left ICA narrowing. 20-30% narrowing of the bilateral common carotids. No large vessel occlusion, high-grade narrowing, dissection or aneurysm involving the major intracranial vessels. Electronically Signed   By: Primitivo Gauze M.D.   On: 10/04/2019 17:39   CT HEAD WO CONTRAST  Result Date: 10/03/2019 CLINICAL DATA:  Stroke, follow-up. EXAM: CT HEAD WITHOUT CONTRAST TECHNIQUE: Contiguous axial images were obtained from the base of the skull through the vertex without intravenous contrast. COMPARISON:  10/03/2019 head CT. FINDINGS: Brain: 3.5 x 3.2 cm focal intraparenchymal hemorrhage within the right putamen and external capsule is grossly unchanged in size. Similar appearance of peripheral edema. Focal subarachnoid hemorrhage overlying the left insula is more conspicuous than prior exam. There is new intraventricular extension of hemorrhage involving the right lateral ventricle. Remote left frontal white matter insult, unchanged. No new hypodensity. No mass lesion. No midline shift, ventriculomegaly or extra-axial fluid collection. Vascular: No hyperdense vessel. Bilateral skull base atherosclerotic calcifications. Skull: No acute finding. Sinuses/Orbits: No acute finding. Other: None. IMPRESSION: Right putamen/external capsule intraparenchymal hematoma measuring 3.5 cm is unchanged. New intraventricular extension of hemorrhage  involving the right lateral ventricle. Subarachnoid hemorrhage overlying the left insula is more conspicuous than prior exam. Electronically Signed   By: Primitivo Gauze M.D.   On: 10/03/2019 19:09   CT ANGIO NECK W OR WO CONTRAST  Result Date: 10/04/2019 CLINICAL DATA:  CTA head and neck. EXAM: CT ANGIOGRAPHY HEAD AND NECK TECHNIQUE:  Multidetector CT imaging of the head and neck was performed using the standard protocol during bolus administration of intravenous contrast. Multiplanar CT image reconstructions and MIPs were obtained to evaluate the vascular anatomy. Carotid stenosis measurements (when applicable) are obtained utilizing NASCET criteria, using the distal internal carotid diameter as the denominator. CONTRAST:  73mL OMNIPAQUE IOHEXOL 350 MG/ML SOLN COMPARISON:  10/03/2019 head CTs. FINDINGS: CT HEAD FINDINGS Brain: 3.3 x 3.1 cm intraparenchymal hemorrhage within the right putamen and external capsule is slightly less conspicuous than prior exam, previously 3.5 x 3.2 cm. Minimal intraventricular extension involving the right lateral ventricle is unchanged. Left insular subarachnoid hemorrhage is unchanged. Remote left frontal white matter insult. No mass lesion. No midline shift, ventriculomegaly or extra-axial fluid collection. Vascular: Please see CTA. Skull: Negative for fracture or focal lesion. Sinuses/Orbits: Normal orbits. Clear paranasal sinuses. No mastoid effusion. Other: None. Review of the MIP images confirms the above findings CTA NECK FINDINGS Aortic arch: Standard branching. Mild aortic arch atherosclerotic calcifications. Imaged portion shows no evidence of aneurysm or dissection. No significant stenosis of the major arch vessel origins. Right carotid system: No evidence of dissection, stenosis (50% or greater) or occlusion. Proximal CCA atherosclerotic calcifications with 20% luminal narrowing. Carotid bifurcation atherosclerotic calcifications. Left carotid system: No evidence of dissection, stenosis (50% or greater) or occlusion. Circumferential proximal CCA atherosclerotic calcifications with 30% luminal narrowing. Carotid bifurcation atherosclerotic calcifications with approximately 40% narrowing of the proximal ICA. Vertebral arteries: Codominant. No evidence of dissection, stenosis (50% or greater) or occlusion.  Skeleton: No acute or suspicious osseous abnormalities. Other neck: No adenopathy.  No soft tissue mass. Upper chest: Dependent atelectasis. Prominent to mildly enlarged superior mediastinal nodes. Review of the MIP images confirms the above findings CTA HEAD FINDINGS Anterior circulation: Bilateral carotid siphon atherosclerotic calcifications without significant narrowing of the intracavernous segments. No significant stenosis, proximal occlusion, aneurysm, or vascular malformation. Posterior circulation: Codominant vertebral arteries. Fetal origin of the bilateral PCAs. No significant stenosis, proximal occlusion, aneurysm, or vascular malformation. Venous sinuses: As permitted by contrast timing, patent. Anatomic variants: Detailed above. Review of the MIP images confirms the above findings IMPRESSION: CT head: Right putamen/external capsule intraparenchymal hematoma measuring 3.3 cm, less conspicuous than prior exam. Trace intraventricular hemorrhage involving the right lateral ventricle is unchanged. Left insular subarachnoid hemorrhage, unchanged. CTA head and neck: 40% proximal left ICA narrowing. 20-30% narrowing of the bilateral common carotids. No large vessel occlusion, high-grade narrowing, dissection or aneurysm involving the major intracranial vessels. Electronically Signed   By: Primitivo Gauze M.D.   On: 10/04/2019 17:39   Chest Port 1 View  Result Date: 10/03/2019 CLINICAL DATA:  Code stroke, cough. EXAM: PORTABLE CHEST 1 VIEW COMPARISON:  08/24/2019 FINDINGS: Mild enlargement the cardiac silhouette. No consolidation. Left basilar atelectasis. The visualized skeletal structures are unremarkable. Similar positioning of a left-sided venous catheter with the distal tip projecting at the right atrium. IMPRESSION: 1. No acute cardiopulmonary disease. 2. Mild left basilar atelectasis. 3. Mild cardiomegaly. 4. Similar position of the left-sided central venous catheter. Electronically Signed   By:  Margaretha Sheffield MD   On: 10/03/2019 09:08   ECHOCARDIOGRAM COMPLETE  Result  Date: 10/04/2019    ECHOCARDIOGRAM REPORT   Patient Name:   KALE RONDEAU Date of Exam: 10/04/2019 Medical Rec #:  [id number]      Height:       71.0 in Accession #:    [id number]     Weight:       158.1 lb Date of Birth:  01/29/1970      BSA:          1.908 m Patient Age:    49 years       BP:           120/58 mmHg Patient Gender: M              HR:           67 bpm. Exam Location:  Inpatient Procedure: 2D Echo, Color Doppler and Cardiac Doppler Indications:    Stroke i163.9  History:        Patient has no prior history of Echocardiogram examinations.                 Risk Factors:Hypertension and Diabetes.  Sonographer:    Raquel Sarna Senior RDCS Referring Phys: [employer]  1. Left ventricular ejection fraction, by estimation, is 60 to 65%. The left ventricle has normal function. The left ventricle has no regional wall motion abnormalities. There is moderate concentric left ventricular hypertrophy. Left ventricular diastolic parameters are consistent with Grade I diastolic dysfunction (impaired relaxation).  2. Right ventricular systolic function is normal. The right ventricular size is normal. There is normal pulmonary artery systolic pressure.  3. The mitral valve is normal in structure. Trivial mitral valve regurgitation. No evidence of mitral stenosis.  4. The aortic valve is normal in structure. Aortic valve regurgitation is not visualized. No aortic stenosis is present.  5. The inferior vena cava is normal in size with greater than 50% respiratory variability, suggesting right atrial pressure of 3 mmHg. FINDINGS  Left Ventricle: Left ventricular ejection fraction, by estimation, is 60 to 65%. The left ventricle has normal function. The left ventricle has no regional wall motion abnormalities. The left ventricular internal cavity size was normal in size. There is  moderate concentric left ventricular hypertrophy.  Left ventricular diastolic parameters are consistent with Grade I diastolic dysfunction (impaired relaxation). Normal left ventricular filling pressure. Right Ventricle: The right ventricular size is normal. No increase in right ventricular wall thickness. Right ventricular systolic function is normal. There is normal pulmonary artery systolic pressure. The tricuspid regurgitant velocity is 2.28 m/s, and  with an assumed right atrial pressure of 3 mmHg, the estimated right ventricular systolic pressure is 30.9 mmHg. Left Atrium: Left atrial size was normal in size. Right Atrium: Right atrial size was normal in size. Pericardium: There is no evidence of pericardial effusion. Mitral Valve: The mitral valve is normal in structure. Trivial mitral valve regurgitation. No evidence of mitral valve stenosis. Tricuspid Valve: The tricuspid valve is normal in structure. Tricuspid valve regurgitation is not demonstrated. No evidence of tricuspid stenosis. Aortic Valve: The aortic valve is normal in structure. Aortic valve regurgitation is not visualized. No aortic stenosis is present. Pulmonic Valve: The pulmonic valve was normal in structure. Pulmonic valve regurgitation is not visualized. No evidence of pulmonic stenosis. Aorta: The aortic root is normal in size and structure. Venous: The inferior vena cava is normal in size with greater than 50% respiratory variability, suggesting right atrial pressure of 3 mmHg. IAS/Shunts: No atrial level shunt detected by color  flow Doppler.  LEFT VENTRICLE PLAX 2D LVIDd:         4.80 cm  Diastology LVIDs:         3.10 cm  LV e' medial:    5.98 cm/s LV PW:         1.40 cm  LV E/e' medial:  8.6 LV IVS:        1.40 cm  LV e' lateral:   11.40 cm/s LVOT diam:     2.30 cm  LV E/e' lateral: 4.5 LV SV:         68 LV SV Index:   36 LVOT Area:     4.15 cm  RIGHT VENTRICLE RV S prime:     11.10 cm/s TAPSE (M-mode): 1.9 cm LEFT ATRIUM             Index       RIGHT ATRIUM           Index LA diam:         4.00 cm 2.10 cm/m  RA Area:     15.40 cm LA Vol (A2C):   57.0 ml 29.88 ml/m RA Volume:   36.20 ml  18.97 ml/m LA Vol (A4C):   51.6 ml 27.05 ml/m LA Biplane Vol: 59.3 ml 31.08 ml/m  AORTIC VALVE LVOT Vmax:   75.10 cm/s LVOT Vmean:  48.400 cm/s LVOT VTI:    0.164 m  AORTA Ao Root diam: 3.50 cm MITRAL VALVE               TRICUSPID VALVE MV Area (PHT): 2.34 cm    TR Peak grad:   20.8 mmHg MV Decel Time: 324 msec    TR Vmax:        228.00 cm/s MV E velocity: 51.40 cm/s MV A velocity: 60.80 cm/s  SHUNTS MV E/A ratio:  0.85        Systemic VTI:  0.16 m                            Systemic Diam: 2.30 cm Dani Gobble Croitoru MD Electronically signed by Sanda Klein MD Signature Date/Time: 10/04/2019/12:20:40 PM    Final    CT HEAD CODE STROKE WO CONTRAST  Result Date: 10/03/2019 CLINICAL DATA:  Code stroke. Headache and left-sided weakness with fixed gaze. Nausea and vomiting. EXAM: CT HEAD WITHOUT CONTRAST TECHNIQUE: Contiguous axial images were obtained from the base of the skull through the vertex without intravenous contrast. COMPARISON:  None. FINDINGS: Brain: 35 x 31 x 30 mm acute hematoma at the right posterior putamen and external capsule. No intraventricular extension but there is a small volume of subarachnoid blood locally. There is a small rim of adjacent edema without gross underlying cortical infarct or mass. No hydrocephalus. Small chronic insult at the inferior left frontal cortex which could be ischemic or traumatic. Vascular: No hyperdense vessel or unexpected calcification. Skull: Normal. Negative for fracture or focal lesion. Sinuses/Orbits: No acute finding. Other: Critical Value/emergent results were called by telephone at the time of interpretation on 10/03/2019 at 8:20 am to provider Rory Percy, who verbally acknowledged these results. ASPECTS [facility] Stroke Program Early CT Score) Not scored in this setting IMPRESSION: 1. 15 cc acute hematoma at the right putamen and adjacent white matter. Small  volume subarachnoid extension seen locally. 2. Small remote left frontal cortex insult, usually infarct. Small remote left cerebellar infarct. Electronically Signed   By: Monte Fantasia M.D.   On:  10/03/2019 08:21     Subjective: No acute issues or events overnight denies nausea, vomiting, diarrhea, constipation, headache, fevers, chills.   Discharge Exam: Vitals:   10/13/19 2239 10/14/19 0803  BP: 137/74 125/68  Pulse: 70 66  Resp: 11 20  Temp: 98.8 F (37.1 C) 98 F (36.7 C)  SpO2: 100% 98%   Vitals:   10/13/19 2147 10/13/19 2239 10/14/19 0529 10/14/19 0803  BP: 131/71 137/74  125/68  Pulse: 65 70  66  Resp:  11  20  Temp: 98.9 F (37.2 C) 98.8 F (37.1 C)  98 F (36.7 C)  TempSrc: Oral Oral    SpO2: 100% 100%  98%  Weight:   69.3 kg   Height:        General:  Pleasantly resting in bed, No acute distress. HEENT:  Normocephalic atraumatic.  Sclerae nonicteric, noninjected.  Extraocular movements intact bilaterally. Neck:  Without mass or deformity.  Trachea is midline. Lungs:  Clear to auscultate bilaterally without rhonchi, wheeze, or rales. Heart:  Regular rate and rhythm.  Without murmurs, rubs, or gallops. Abdomen:  Soft, nontender, nondistended.  Without guarding or rebound. Extremities: Without cyanosis, clubbing, edema, bilateral BKA Vascular:  Dorsalis pedis and posterior tibial pulses palpable bilaterally. Skin:  Warm and dry, no erythema, no ulcerations.   The results of significant diagnostics from this hospitalization (including imaging, microbiology, ancillary and laboratory) are listed below for reference.     Microbiology: Recent Results (from the past 240 hour(s))  C Difficile Quick Screen w PCR reflex     Status: Abnormal   Collection Time: 10/05/19  9:41 PM   Specimen: STOOL  Result Value Ref Range Status   C Diff antigen POSITIVE (A) NEGATIVE Final   C Diff toxin NEGATIVE NEGATIVE Final   C Diff interpretation Results are indeterminate. See  PCR results.  Final    Comment: Performed at [facility], [place] [address] [place], [place] [address]  C. Diff by PCR, Reflexed     Status: Abnormal   Collection Time: 10/05/19  9:41 PM  Result Value Ref Range Status   Toxigenic C. Difficile by PCR POSITIVE (A) NEGATIVE Final    Comment: Positive for toxigenic C. difficile with little to no toxin production. Only treat if clinical presentation suggests symptomatic illness. Performed at [facility], [place] [address] [place], [place] [address]   SARS Coronavirus 2 by RT PCR (hospital order, performed in [facility] hospital lab) Nasopharyngeal Nasopharyngeal Swab     Status: None   Collection Time: 10/08/19  2:33 PM   Specimen: Nasopharyngeal Swab  Result Value Ref Range Status   SARS Coronavirus 2 NEGATIVE NEGATIVE Final    Comment: (NOTE) SARS-CoV-2 target nucleic acids are NOT DETECTED.  The SARS-CoV-2 RNA is generally detectable in upper and lower respiratory specimens during the acute phase of infection. The lowest concentration of SARS-CoV-2 viral copies this assay can detect is 250 copies / mL. A negative result does not preclude SARS-CoV-2 infection and should not be used as the sole basis for treatment or other patient management decisions.  A negative result may occur with improper specimen collection / handling, submission of specimen other than nasopharyngeal swab, presence of viral mutation(s) within the areas targeted by this assay, and inadequate number of viral copies (<250 copies / mL). A negative result must be combined with clinical observations, patient history, and epidemiological information.  Fact Sheet for Patients:   StrictlyIdeas.no  Fact Sheet for Healthcare Providers:  BankingDealers.co.za  This test is not yet approved or  cleared by the [place] and has been authorized for detection and/or diagnosis of SARS-CoV-2 by FDA under an  Emergency Use Authorization (EUA).  This EUA will remain in effect (meaning this test can be used) for the duration of the COVID-19 declaration under Section 564(b)(1) of the Act, 21 U.S.C. section 360bbb-3(b)(1), unless the authorization is terminated or revoked sooner.  Performed at [facility], [place] [address] [place], [place] [address]   Culture, blood (routine x 2)     Status: None (Preliminary result)   Collection Time: 10/10/19  1:14 AM   Specimen: BLOOD  Result Value Ref Range Status   Specimen Description BLOOD LEFT ARM  Final   Special Requests   Final    BOTTLES DRAWN AEROBIC AND ANAEROBIC Blood Culture adequate volume   Culture   Final    NO GROWTH 4 DAYS Performed at [facility], [place] [address], [place], [place] [address]    Report Status PENDING  Incomplete  Culture, blood (routine x 2)     Status: None (Preliminary result)   Collection Time: 10/10/19  1:19 AM   Specimen: BLOOD  Result Value Ref Range Status   Specimen Description BLOOD LEFT HAND  Final   Special Requests   Final    BOTTLES DRAWN AEROBIC AND ANAEROBIC Blood Culture adequate volume   Culture   Final    NO GROWTH 4 DAYS Performed at [facility], [place] [address] [place], [place] [address]    Report Status PENDING  Incomplete     Labs: BNP (last 3 results) No results for input(s): BNP in the last 8760 hours. Basic Metabolic Panel: Recent Labs  Lab 10/08/19 0551 10/09/19 1219 10/10/19 0730 10/13/19 0834  NA 134* 135 136 133*  K 5.1 4.8 5.1 6.6*  CL 102 99 98 98  CO2 20* 24 26 23   GLUCOSE 91 106* 179* 127*  BUN 51* 35* 52* 50*  CREATININE 7.38* 6.18* 8.05* 10.20*  CALCIUM 8.6* 8.8* 9.0 9.2  PHOS 4.1 3.8 4.7* 3.8   Liver Function Tests: Recent Labs  Lab 10/08/19 0551 10/09/19 1219 10/10/19 0730 10/13/19 0834  ALBUMIN 2.8* 2.9* 2.7* 2.6*   No results for input(s): LIPASE, AMYLASE in the last 168 hours. No results for input(s): AMMONIA in the last 168  hours. CBC: Recent Labs  Lab 10/08/19 0825 10/10/19 0729 10/13/19 0835  WBC 6.6 6.2 7.3  HGB 8.5* 8.2* 8.1*  HCT 28.5* 27.5* 26.6*  MCV 98.6 95.2 93.3  PLT 127* 123* 160   Cardiac Enzymes: No results for input(s): CKTOTAL, CKMB, CKMBINDEX, TROPONINI in the last 168 hours. BNP: Invalid input(s): POCBNP CBG: Recent Labs  Lab 10/13/19 0712 10/13/19 1259 10/13/19 1822 10/13/19 2105 10/14/19 0804  GLUCAP 119* 104* 124* 126* 129*   D-Dimer No results for input(s): DDIMER in the last 72 hours. Hgb A1c No results for input(s): HGBA1C in the last 72 hours. Lipid Profile No results for input(s): CHOL, HDL, LDLCALC, TRIG, CHOLHDL, LDLDIRECT in the last 72 hours. Thyroid function studies No results for input(s): TSH, T4TOTAL, T3FREE, THYROIDAB in the last 72 hours.  Invalid input(s): FREET3 Anemia work up No results for input(s): VITAMINB12, FOLATE, FERRITIN, TIBC, IRON, RETICCTPCT in the last 72 hours. Urinalysis    Component Value Date/Time   COLORURINE YELLOW 08/22/2019 1154   APPEARANCEUR CLEAR 08/22/2019 1154   LABSPEC 1.010 08/22/2019 1154   PHURINE 5.0 08/22/2019  1154   GLUCOSEU >=500 (A) 08/22/2019 1154   HGBUR MODERATE (A) 08/22/2019 1154   BILIRUBINUR NEGATIVE 08/22/2019 1154   KETONESUR 5 (A) 08/22/2019 1154   PROTEINUR 100 (A) 08/22/2019 1154   NITRITE NEGATIVE 08/22/2019 1154   LEUKOCYTESUR SMALL (A) 08/22/2019 1154   Sepsis Labs Invalid input(s): PROCALCITONIN,  WBC,  LACTICIDVEN Microbiology Recent Results (from the past 240 hour(s))  C Difficile Quick Screen w PCR reflex     Status: Abnormal   Collection Time: 10/05/19  9:41 PM   Specimen: STOOL  Result Value Ref Range Status   C Diff antigen POSITIVE (A) NEGATIVE Final   C Diff toxin NEGATIVE NEGATIVE Final   C Diff interpretation Results are indeterminate. See PCR results.  Final    Comment: Performed at [facility], [place] [address] [place], [place] [address]  C. Diff by PCR, Reflexed      Status: Abnormal   Collection Time: 10/05/19  9:41 PM  Result Value Ref Range Status   Toxigenic C. Difficile by PCR POSITIVE (A) NEGATIVE Final    Comment: Positive for toxigenic C. difficile with little to no toxin production. Only treat if clinical presentation suggests symptomatic illness. Performed at [facility], [place] [address], [place], [place] [address]   SARS Coronavirus 2 by RT PCR (hospital order, performed in [facility] hospital lab) Nasopharyngeal Nasopharyngeal Swab     Status: None   Collection Time: 10/08/19  2:33 PM   Specimen: Nasopharyngeal Swab  Result Value Ref Range Status   SARS Coronavirus 2 NEGATIVE NEGATIVE Final    Comment: (NOTE) SARS-CoV-2 target nucleic acids are NOT DETECTED.  The SARS-CoV-2 RNA is generally detectable in upper and lower respiratory specimens during the acute phase of infection. The lowest concentration of SARS-CoV-2 viral copies this assay can detect is 250 copies / mL. A negative result does not preclude SARS-CoV-2 infection and should not be used as the sole basis for treatment or other patient management decisions.  A negative result may occur with improper specimen collection / handling, submission of specimen other than nasopharyngeal swab, presence of viral mutation(s) within the areas targeted by this assay, and inadequate number of viral copies (<250 copies / mL). A negative result must be combined with clinical observations, patient history, and epidemiological information.  Fact Sheet for Patients:   StrictlyIdeas.no  Fact Sheet for Healthcare Providers: BankingDealers.co.za  This test is not yet approved or  cleared by the [place] FDA and has been authorized for detection and/or diagnosis of SARS-CoV-2 by FDA under an Emergency Use Authorization (EUA).  This EUA will remain in effect (meaning this test can be used) for the duration of the COVID-19 declaration  under Section 564(b)(1) of the Act, 21 U.S.C. section 360bbb-3(b)(1), unless the authorization is terminated or revoked sooner.  Performed at [facility], [place] [address], [place], [place] [address]   Culture, blood (routine x 2)     Status: None (Preliminary result)   Collection Time: 10/10/19  1:14 AM   Specimen: BLOOD  Result Value Ref Range Status   Specimen Description BLOOD LEFT ARM  Final   Special Requests   Final    BOTTLES DRAWN AEROBIC AND ANAEROBIC Blood Culture adequate volume   Culture   Final    NO GROWTH 4 DAYS Performed at [facility], [place] [address], [place], [place] [address]    Report Status PENDING  Incomplete  Culture, blood (routine x 2)  Status: None (Preliminary result)   Collection Time: 10/10/19  1:19 AM   Specimen: BLOOD  Result Value Ref Range Status   Specimen Description BLOOD LEFT HAND  Final   Special Requests   Final    BOTTLES DRAWN AEROBIC AND ANAEROBIC Blood Culture adequate volume   Culture   Final    NO GROWTH 4 DAYS Performed at [facility], [address], [place], [place] [address]    Report Status PENDING  Incomplete     Time coordinating discharge: Over 30 minutes  SIGNED:   Little Ishikawa, DO [facility] 10/14/2019, 12:14 PM Pager   If 7PM-7AM, please contact night-coverage [email or web address]

## 2019-10-14 NOTE — TOC Progression Note (Signed)
Transition of Care [facility]) - Progression Note    Patient Details  Name: Jorge [name] MRN: [id number] Date of Birth: [birth year]  Transition of Care [facility]) CM/SW Contact  Joanne Chars, [place] Phone Number: 10/14/2019, 2:55 PM  Clinical Narrative:   Terri Piedra, renal navigator, spoke with pt again today at request of MD and pt is more open to changing dialysis days in order to facilitate transfer to [facility]F. Colleen speaking to the new dialysis center to confirm the change.  CSW spoke with Dedra at [facility] and they will continue to keep pt bed available.    Expected Discharge Plan: [place] Barriers to Discharge: Continued Medical Work up, SNF Pending bed offer  Expected Discharge Plan and Services Expected Discharge Plan: [place] Choice: [place] arrangements for the past 2 months: Single Family Home                                       Social Determinants of Health (SDOH) Interventions    Readmission Risk Interventions No flowsheet data found.

## 2019-10-14 NOTE — Progress Notes (Signed)
Physical Therapy Treatment Patient Details Name: Jorge [name] MRN: [id number] DOB: February 29, 1970 Today's Date: 10/14/2019    History of Present Illness 49 y.o. 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 Stat CT performed, consistent with 15 cc hematoma in the right putamen and adjacent white matter with a small volume subarachnoid extension locally.  Small remote left frontal cortex insult and small remote left cerebellar infarct.    PT Comments    Pt with significant difficulty donning prostheses today, especially R PT suspecting due to need for sizing adjustment (medial aspect of knee having difficulty fitting in prosthesis). Pt requiring min assist for repeated transfers and hallway ambulation this day, with cues for attending to L and increasing L foot clearance during swing phase. SNF remains appropriate d/c plan, progressing well with acute PT. Will continue to follow acutely.    Follow Up Recommendations  SNF     Equipment Recommendations  Other (comment) (TBD)    Recommendations for Other Services       Precautions / Restrictions Precautions Precautions: Fall Precaution Comments: bilateral BKA Restrictions Weight Bearing Restrictions: No    Mobility  Bed Mobility Overal bed mobility: Needs Assistance             General bed mobility comments: up at EOB with OT  Transfers Overall transfer level: Needs assistance Equipment used: Rolling walker (2 wheeled) Transfers: Sit to/from Stand Sit to Stand: Min assist;From elevated surface         General transfer comment: min assist for power up, steadying, and weight shifting L and R to "click" L prosthesis on. sit to stand x6 at EOB to don prostheses, significant difficulty donning L  prosthesis.  Ambulation/Gait Ambulation/Gait assistance: Min assist Gait Distance (Feet): 110 Feet Assistive device: Rolling walker (2 wheeled) Gait Pattern/deviations: Step-through pattern;Decreased stride length;Trunk flexed;Drifts right/left Gait velocity: decr   General Gait Details: Min assist to steady, guide pt away from obstacles on L, physically move RW. Verbal cuing for upright posture, positioning in RW especially during directional changes, and increasing L foot clearance especially with fatigue.   Stairs             Wheelchair Mobility    Modified Rankin (Stroke Patients Only) Modified Rankin (Stroke Patients Only) Pre-Morbid Rankin Score: Moderate disability Modified Rankin: Moderately severe disability     Balance Overall balance assessment: Needs assistance Sitting-balance support: No upper extremity supported;Feet supported Sitting balance-Leahy Scale: Fair Sitting balance - Comments: close supervision dynamically   Standing balance support: Bilateral upper extremity supported;During functional activity;No upper extremity supported Standing balance-Leahy Scale: Poor Standing balance comment: reliant on external support                            Cognition Arousal/Alertness: Awake/alert Behavior During Therapy: Flat affect Overall Cognitive Status: Impaired/Different from baseline Area of Impairment: Safety/judgement;Awareness;Problem solving;Attention                   Current Attention Level: Sustained   Following Commands: Follows multi-step commands inconsistently;Follows one step commands with increased time Safety/Judgement: Decreased awareness of safety;Decreased awareness of deficits Awareness: Emergent Problem Solving: Difficulty sequencing;Requires verbal cues;Slow processing;Decreased initiation;Requires tactile cues General Comments: Pt with blanket over head upon PT arrival to room, requires cues to attend to L  throughout session. For example, pt with protein drink spilled all over left side of bed and pt did not notice, L hand slipping off EOB during scooting, running into objects on the L in hallway, and heavy L lateral leaning with seated tasks.      Exercises      General Comments        Pertinent Vitals/Pain Pain Assessment: Faces Faces Pain Scale: No hurt Pain Intervention(s): Limited activity within patient's tolerance    Home Living                      Prior Function            PT Goals (current goals can now be found in the care plan section) Acute Rehab PT Goals Patient Stated Goal: To return to independence PT Goal Formulation: With patient Time For Goal Achievement: 10/18/19 Potential to Achieve Goals: Good Progress towards PT goals: Progressing toward goals    Frequency    Min 3X/week      PT Plan Current plan remains appropriate    Co-evaluation PT/OT/SLP Co-Evaluation/Treatment:  (dovetail with OT)            AM-PAC PT "6 Clicks" Mobility   Outcome Measure  Help needed turning from your back to your side while in a flat bed without using bedrails?: A Little Help needed moving from lying on your back to sitting on the side of a flat bed without using bedrails?: A Little Help needed moving to and from a bed to a chair (including a wheelchair)?: A Lot Help needed standing up from a chair using your arms (e.g., wheelchair or bedside chair)?: A Little Help needed to walk in hospital room?: A Little Help needed climbing 3-5 steps with a railing? : A Lot 6 Click Score: 16    End of Session   Activity Tolerance: Patient limited by fatigue Patient left: with call bell/phone within reach;in chair;with chair alarm set Nurse Communication: Mobility status PT Visit Diagnosis: Other abnormalities of gait and mobility (R26.89);Muscle weakness (generalized) (M62.81);Other symptoms and signs involving the nervous system (R29.898)     Time:  4315-4008 PT Time Calculation (min) (ACUTE ONLY): 26 min  Charges:  $Gait Training: 8-22 mins                    [name] E, PT [place] Pager [phone]  Office [phone]    [name] 10/14/2019, 11:37 AM

## 2019-10-14 NOTE — Progress Notes (Signed)
Renal Navigator received message from Attending/Dr. Avon Gully that patient is now willing to discuss switching to another clinic in order to get a MWF seat for HD. Navigator appreciates continued conversations with patient by medical team. Navigator met with patient at bedside to discuss again. Patient was much more willing to converse today and discuss reasons why he does not want to change clinics. We talked about the pros, in additions to the cons, and the bottom line is that within his willingness to explore other clinic options for a MWF seat, he will not be able to discharge to SNF, since his only option requires him to have a MWF seat and his current clinic is not able to provide this schedule at this time.  Patient initially wanted 20 minutes to think about this and talk with his sister. Navigator left to provide him with with this time. When Navigator returned, patient seemed to be in very good spirits and had his sister on "FaceTime." He said, "let's do it," meaning that he was willing to change clinics to get a MWF seat.  Navigator has asked a patient in the hospital to switch with patient, as a new start patient has been given a MWF seat at [facility]. New start patient is willing to switch and now Navigator is in the process of transferring records and requesting MD acceptance for both patients at both clinics.  Renal Navigator following closely.  Alphonzo Cruise, [place] Renal Navigator [phone]

## 2019-10-14 NOTE — Progress Notes (Signed)
Initial Nutrition Assessment  DOCUMENTATION CODES:   Not applicable  INTERVENTION:    Nepro Shake po BID, each supplement provides 425 kcal and 19 grams protein  Renal MVI   NUTRITION DIAGNOSIS:   Increased nutrient needs related to acute illness as evidenced by estimated needs.  GOAL:   Patient will meet greater than or equal to 90% of their needs  MONITOR:   PO intake, Supplement acceptance, Weight trends, Labs, I & O's  REASON FOR ASSESSMENT:   Malnutrition Screening Tool    ASSESSMENT:   Patient with PMH significant for DM with gastroparesis, HTN, bilateral BKA, ESRD on HD, and HLD. Presents this admission with L ICH w/ IVH and SAH.   Pt with blankets over head. Denies loss in appetite or weight loss. Drinking two Nepros daily. Unable to provide nutrition history. Continue current interventions.   Plan d/c to SNF once outpatient HD set up.   EDW: 68 kg  Current weight: 69.3 kg   Medications: calcitriol, aranesp, 20 mg daily, SS novolog, lantus, rena-vit, senokot, renvela, Vit D Labs: Na 133 (L) K 6.6 (H) CBG 104-158  Diet Order:   Diet Order            Diet renal/carb modified with fluid restriction Diet-HS Snack? Nothing; Fluid restriction: 1200 mL Fluid; Room service appropriate? Yes with Assist; Fluid consistency: Thin  Diet effective now                 EDUCATION NEEDS:   Not appropriate for education at this time  Skin:  Skin Assessment: Reviewed RN Assessment  Last BM:  10/5  Height:   Ht Readings from Last 1 Encounters:  10/03/19 5\' 11"  (1.803 m)    Weight:   Wt Readings from Last 1 Encounters:  10/14/19 69.3 kg   BMI:  Body mass index is 21.31 kg/m.  Estimated Nutritional Needs:   Kcal:  2100-2300 kcal  Protein:  105-120 grams  Fluid:  1000 ml + UOP  Mariana Single RD, LDN Clinical Nutrition Pager listed in [place]

## 2019-10-14 NOTE — Progress Notes (Signed)
[facility] Progress Note   Subjective:  Seen in room, no c/o  Objective Vitals:   10/13/19 2147 10/13/19 2239 10/14/19 0529 10/14/19 0803  BP: 131/71 137/74  125/68  Pulse: 65 70  66  Resp:  11  20  Temp: 98.9 F (37.2 C) 98.8 F (37.1 C)  98 F (36.7 C)  TempSrc: Oral Oral    SpO2: 100% 100%  98%  Weight:   69.3 kg   Height:       Physical Exam General: Well appearing man, in chair, no c/o Heart: RRR; no murmur Lungs: CTA anteriorly Abdomen: soft, non-tender Extremities: B BKA, no stump edema Dialysis Access: TDC in L chest, RUE AVF + thrill   Dialysis:  [facility] 4h 68kg 2/2.5 bath  R BVT AVF+ L IJ TDC   Hep none   -  ok for heparin block to [facility], was on hep 4200u on hold due to bleed   - R AVF was 08/28/19, maturing    - mircera 200 q 2 last 9/16 - venofer 100 x 3 more doses - calcitriol 1 ug tiw Recent labs: hgb 9 trending up tsat 18%  Assessment/Plan: 1.Hemorrhagic R brain CVA - per Neurology. No heparin with HD. Ok to use to lock [facility]. 2. ESRD- Continue HD per TTS sched. HD tomorrow 3. Anemiaof CKD-Hgb 8.2. Last tsat 9%, on Venofer course + ESA. 4. Secondary hyperparathyroidism- Ca and phos in goal. Continue binders and VDRA - renvela decreased to 2AC TID due to initial low phos.  5.HTN/volume- s/p IV cleviprex. Per primary/stroke team in setting of CVA. Initial goal to keep SBP <140, now <160, getting norvasc 10 + coreg 12.5 bid + lasix 20mg  and prn labetalol. 1kg up today, no vol ^ on exam 6. Nutrition- Alb low, continue supplements 7.C diff colitis- per primary, on PO vanc 8.DMT2- per primary. On insulin 9. [name]- Plan is for SNF 10. AMS + recurrent fever (10/1): Back to baseline MS. Blood Cx drawn 10/1 - NGTD.  Kelly Splinter, MD 10/14/2019, 1:30 PM  Additional Objective Labs: Basic Metabolic Panel: Recent Labs  Lab 10/09/19 1219 10/10/19 0730 10/13/19 0834  NA 135 136 133*  K 4.8 5.1 6.6*  CL 99 98 98   CO2 24 26 23   GLUCOSE 106* 179* 127*  BUN 35* 52* 50*  CREATININE 6.18* 8.05* 10.20*  CALCIUM 8.8* 9.0 9.2  PHOS 3.8 4.7* 3.8   Liver Function Tests: Recent Labs  Lab 10/09/19 1219 10/10/19 0730 10/13/19 0834  ALBUMIN 2.9* 2.7* 2.6*   CBC: Recent Labs  Lab 10/08/19 0825 10/10/19 0729 10/13/19 0835  WBC 6.6 6.2 7.3  HGB 8.5* 8.2* 8.1*  HCT 28.5* 27.5* 26.6*  MCV 98.6 95.2 93.3  PLT 127* 123* 160   Blood Culture    Component Value Date/Time   SDES BLOOD LEFT HAND 10/10/2019 0119   SPECREQUEST  10/10/2019 0119    BOTTLES DRAWN AEROBIC AND ANAEROBIC Blood Culture adequate volume   CULT  10/10/2019 0119    NO GROWTH 4 DAYS Performed at [facility], [place] [address] [place], [place] [address]    REPTSTATUS PENDING 10/10/2019 0119   Medications:  . amLODipine  10 mg Oral Daily  . atorvastatin  20 mg Oral QHS  . calcitRIOL  1 mcg Oral Q T,Th,Sa-HD  . carvedilol  12.5 mg Oral BID WC  . Chlorhexidine Gluconate Cloth  6 each Topical Q0600  . darbepoetin (ARANESP) injection - DIALYSIS  100 mcg  Intravenous Q Sat-HD  . feeding supplement (NEPRO CARB STEADY)  237 mL Oral BID BM  . furosemide  20 mg Oral Daily  . gabapentin  300 mg Oral Daily  . insulin aspart  0-6 Units Subcutaneous TID WC  . insulin glargine  5 Units Subcutaneous Daily  . multivitamin  1 tablet Oral QHS  . pantoprazole  40 mg Oral QHS  . senna-docusate  1 tablet Oral BID  . sevelamer carbonate  1,600 mg Oral TID WC  . vancomycin  125 mg Oral QID  . Vitamin D (Ergocalciferol)  50,000 Units Oral Q Sat

## 2019-10-14 NOTE — Progress Notes (Signed)
Renal Navigator notified by CSW/G. [place] that patient has a SNF bed offer at [facility], but that they cannot take him on a TTS schedule. Navigator spoke with patient's OP HD/[facility] who unfortunately does not have any open spots on MWF for patient. Navigator asked that Clinic Manager please let Navigator know if this changes at any time. Navigator met with patient at bedside to discuss. Navigator asked patient if he would be willing to change clinics if a MWF seat could be secured at a different clinic for him (this is not a guarantee, but with patient's permission, Navigator will explore this option). Patient did not make eye contact with Navigator and immediately said he is not at all willing to consider changing clinics and said, "I guess I'll stay right here then." Navigator noted that remaining in the hospital is not what is best for him or anyone else. He again stated that he does not want to discuss changing clinics. Navigator encouraged him to think about it and let someone know if he changes his mind in the future and wants to discuss. Navigator updated CSW and asked if he could discuss the possibility of other transportation options (Medicaid or [facility] [place]) with the SNF to see if they would consider admitting him even though he has a TTS schedule for OP HD.  Jorge [name], [place] Renal Navigator [phone]

## 2019-10-15 DIAGNOSIS — A0472 Enterocolitis due to Clostridium difficile, not specified as recurrent: Secondary | ICD-10-CM | POA: Diagnosis not present

## 2019-10-15 LAB — RENAL FUNCTION PANEL
Albumin: 2.6 g/dL — ABNORMAL LOW (ref 3.5–5.0)
Anion gap: 13 (ref 5–15)
BUN: 39 mg/dL — ABNORMAL HIGH (ref 6–20)
CO2: 25 mmol/L (ref 22–32)
Calcium: 8.8 mg/dL — ABNORMAL LOW (ref 8.9–10.3)
Chloride: 99 mmol/L (ref 98–111)
Creatinine, Ser: 8.66 mg/dL — ABNORMAL HIGH (ref 0.61–1.24)
GFR calc non Af Amer: 6 mL/min — ABNORMAL LOW (ref >60)
Glucose, Bld: 199 mg/dL — ABNORMAL HIGH (ref 70–99)
Phosphorus: 5.1 mg/dL — ABNORMAL HIGH (ref 2.5–4.6)
Potassium: 4.5 mmol/L (ref 3.5–5.1)
Sodium: 137 mmol/L (ref 135–145)

## 2019-10-15 LAB — SARS CORONAVIRUS 2 BY RT PCR (HOSPITAL ORDER, PERFORMED IN ~~LOC~~ HOSPITAL LAB): SARS Coronavirus 2: NEGATIVE

## 2019-10-15 LAB — CULTURE, BLOOD (ROUTINE X 2)
Culture: NO GROWTH
Culture: NO GROWTH
Special Requests: ADEQUATE
Special Requests: ADEQUATE

## 2019-10-15 LAB — CBC
HCT: 28.5 % — ABNORMAL LOW (ref 39.0–52.0)
Hemoglobin: 8.5 g/dL — ABNORMAL LOW (ref 13.0–17.0)
MCH: 27.8 pg (ref 26.0–34.0)
MCHC: 29.8 g/dL — ABNORMAL LOW (ref 30.0–36.0)
MCV: 93.1 fL (ref 80.0–100.0)
Platelets: 165 10*3/uL (ref 150–400)
RBC: 3.06 MIL/uL — ABNORMAL LOW (ref 4.22–5.81)
RDW: 14.9 % (ref 11.5–15.5)
WBC: 5.4 10*3/uL (ref 4.0–10.5)
nRBC: 0 % (ref 0.0–0.2)

## 2019-10-15 LAB — GLUCOSE, CAPILLARY
Glucose-Capillary: 177 mg/dL — ABNORMAL HIGH (ref 70–99)
Glucose-Capillary: 118 mg/dL — ABNORMAL HIGH (ref 70–99)
Glucose-Capillary: 188 mg/dL — ABNORMAL HIGH (ref 70–99)

## 2019-10-15 LAB — HEPATITIS B SURFACE ANTIGEN: Hepatitis B Surface Ag: NONREACTIVE

## 2019-10-15 MED ORDER — CALCITRIOL 0.5 MCG PO CAPS
ORAL_CAPSULE | ORAL | Status: AC
  Filled 2019-10-15: qty 1

## 2019-10-15 MED ORDER — AMLODIPINE BESYLATE 5 MG PO TABS
5.0000 mg | ORAL_TABLET | Freq: Every day | ORAL | Status: DC
  Filled 2019-10-15: qty 1

## 2019-10-15 MED ORDER — HEPARIN SODIUM (PORCINE) 1000 UNIT/ML IJ SOLN
INTRAMUSCULAR | Status: AC
  Administered 2019-10-15: 1000 [IU]
  Filled 2019-10-15: qty 5

## 2019-10-15 MED ORDER — CARVEDILOL 6.25 MG PO TABS
6.2500 mg | ORAL_TABLET | Freq: Two times a day (BID) | ORAL | Status: DC
  Administered 2019-10-15: 6.25 mg via ORAL
  Filled 2019-10-15 (×2): qty 1

## 2019-10-15 NOTE — Progress Notes (Signed)
Physician Discharge Summary  Jorge [name] TWS:[id number] DOB: 03-26-1970 DOA: 10/03/2019  PCP: Lauree Chandler, NP  Admit date: 10/03/2019 Discharge date: 10/15/2019  Admitted From: Home Disposition: SNF  Recommendations for Outpatient Follow-up:  1. Follow up with PCP in 1-2 weeks 2. Follow-up with nephrology and continue dialysis as scheduled  Discharge Condition: Stable CODE STATUS: Full Diet recommendation: Renal diet  Brief/Interim Summary: Jorge [name] a 49 y.o.malepast medical history of diabetes with gastroparesis, hypertension, BKA, ESRD on peritoneal dialysis, with no prior medical history of strokes, presented to the emergency room for sudden onset of left-sided weakness and confusion. Last known normal 9 PM on 10/02/2019. Seen again by family this morning and speech was slurred. Was complaining of a headache. Appeared more drowsy to family. EMS was called. On EMS evaluation, exhibited symptoms of left-sided weakness and fast ED calculator showing an LVO score of 4 prompting activation of an LVO positive code stroke. Brought into the emergency room, evaluated at the bridge-NIH stroke scale of 11 consistent with a right hemispheric stroke. Stat CT performed, consistent with 15 cc hematoma in the right putamen and adjacent white matter with a small volume subarachnoid extension locally. Small remote left frontal cortex insult and small remote left cerebellar infarct. Upon arrival: Blood sugars in the 00F, systolic blood pressure 749S. Given D50 prior to CT.  She admitted as above with left-sided weakness and confusion, left frontal cortex and small left cerebellar infarcts with small intracranial as well as subarachnoid hemorrhages.  He recovered quite well, PT continues to recommend SNF placement, he is otherwise stable and agreeable for SNF, he had been diagnosed with C. difficile, will complete his vancomycin through 10/15/2019 with essentially no ongoing diarrhea or  loose stool.  At this point the issue for discharge is that patient is on a Tuesday Thursday Saturday dialysis schedule and SNF cannot provide transport on Saturday thus we are attempting to switch patient to Monday Wednesday Friday schedule with hopes to improve compliance with dialysis at facility.   Patient now in agreement to switch dialysis schedule to Monday Wednesday Friday which will help with placement, otherwise medically stable for discharge awaiting SNF approval processing paperwork and transport.  Discharge Diagnoses:  Active Problems:   ICH (intracerebral hemorrhage) ([name])   C. difficile diarrhea   Discharge Instructions  Discharge Instructions    Ambulatory referral to Neurology   Complete by: As directed    Follow up with stroke clinic NP (Jessica Vanschaick or Cecille Rubin, if both not available, consider Zachery Dauer, or Ahern) at [facility] in about 4 weeks. Thanks.     Allergies as of 10/15/2019      Reactions   Lactose Intolerance (gi) Diarrhea, Nausea Only      Medication List    STOP taking these medications   cloNIDine 0.1 MG tablet Commonly known as: CATAPRES   oxyCODONE-acetaminophen 5-325 MG tablet Commonly known as: PERCOCET/ROXICET     TAKE these medications   amLODipine 10 MG tablet Commonly known as: NORVASC Take 10 mg by mouth daily.   aspirin EC 81 MG tablet Take 81 mg by mouth daily.   atorvastatin 20 MG tablet Commonly known as: LIPITOR TAKE 1 TABLET(20 MG) BY MOUTH DAILY What changed: See the new instructions.   calcitRIOL 0.5 MCG capsule Commonly known as: ROCALTROL Take 1 capsule (0.5 mcg total) by mouth 3 (three) times a week.   carvedilol 12.5 MG tablet Commonly known as: COREG Take 1 tablet (12.5 mg total) by mouth  2 (two) times daily with a meal.   Darbepoetin Alfa 200 MCG/0.4ML Sosy injection Commonly known as: ARANESP Inject 0.4 mLs (200 mcg total) into the skin every Thursday at [facility] Check  blood sugar twice daily as directed DX E11.9 What changed:   how much to take  how to take this  when to take this   Dexcom G6 Sensor Misc Check blood sugar twice daily as directed DX E11.9 What changed:   how much to take  how to take this  when to take this   Dexcom G6 Transmitter Misc Check blood sugar twice daily as directed DX E11.9 What changed:   how much to take  how to take this  when to take this   furosemide 20 MG tablet Commonly known as: LASIX Take 20 mg by mouth daily.   gabapentin 300 MG capsule Commonly known as: NEURONTIN Take 1 capsule (300 mg total) by mouth daily as needed. What changed:   when to take this  reasons to take this   gentamicin cream 0.1 % Commonly known as: GARAMYCIN Apply 1 application topically daily.   Lantus SoloStar 100 UNIT/ML Solostar Pen Generic drug: insulin glargine Inject 5 Units into the skin daily. What changed: See the new instructions.   Loperamide HCl 1 MG/7.5ML Liqd Take 10 mLs by mouth every 6 (six) hours as needed for diarrhea or loose stools.   metoCLOPramide 10 MG tablet Commonly known as: REGLAN Take 1 tablet (10 mg total) by mouth every 6 (six) hours as needed for nausea or vomiting.   NovoLOG FlexPen 100 UNIT/ML FlexPen Generic drug: insulin aspart INJECT 5 UNITS INTO SKIN 3 TIMES DAILY WITH MEALS What changed: See the new instructions.   ondansetron 4 MG disintegrating tablet Commonly known as: Zofran ODT Take 1 tablet (4 mg total) by mouth every 8 (eight) hours as needed for vomiting (When you are actively vomiting and unable to tolerate Reglan).   pantoprazole 40 MG tablet Commonly known as: PROTONIX Take 1 tablet (40 mg total) by mouth at bedtime.   Pen Needles 30G X 8 MM Misc 1 each by Does not apply route in the morning, at noon, in the evening, and at bedtime.   ProRenal + D Tabs Take 1 tablet by mouth daily.   sevelamer carbonate 800 MG tablet Commonly known as:  RENVELA Take 2 tablets (1,600 mg total) by mouth 3 (three) times daily with meals. What changed: how much to take   vancomycin 50 mg/mL  oral solution Commonly known as: VANCOCIN Take 2.5 mLs (125 mg total) by mouth 4 (four) times daily for 1 day.   Vitamin D (Ergocalciferol) 1.25 MG (50000 UNIT) Caps capsule Commonly known as: DRISDOL Take 50,000 Units by mouth once a week. saturdays       Follow-up Information    [facility]. Schedule an appointment as soon as possible for a visit in 4 week(s).   Specialty: Neurology Contact information: [place] [phone]             Allergies  Allergen Reactions  . Lactose Intolerance (Gi) Diarrhea and Nausea Only    Consultations:  Nephrology   Procedures/Studies: CT ANGIO HEAD W OR WO CONTRAST  Result Date: 10/04/2019 CLINICAL DATA:  CTA head and neck. EXAM: CT ANGIOGRAPHY HEAD AND NECK TECHNIQUE: Multidetector CT imaging of the head and neck was performed using the standard protocol during bolus administration of  intravenous contrast. Multiplanar CT image reconstructions and MIPs were obtained to evaluate the vascular anatomy. Carotid stenosis measurements (when applicable) are obtained utilizing NASCET criteria, using the distal internal carotid diameter as the denominator. CONTRAST:  67mL OMNIPAQUE IOHEXOL 350 MG/ML SOLN COMPARISON:  10/03/2019 head CTs. FINDINGS: CT HEAD FINDINGS Brain: 3.3 x 3.1 cm intraparenchymal hemorrhage within the right putamen and external capsule is slightly less conspicuous than prior exam, previously 3.5 x 3.2 cm. Minimal intraventricular extension involving the right lateral ventricle is unchanged. Left insular subarachnoid hemorrhage is unchanged. Remote left frontal white matter insult. No mass lesion. No midline shift, ventriculomegaly or extra-axial fluid collection. Vascular: Please see CTA. Skull: Negative for fracture or focal  lesion. Sinuses/Orbits: Normal orbits. Clear paranasal sinuses. No mastoid effusion. Other: None. Review of the MIP images confirms the above findings CTA NECK FINDINGS Aortic arch: Standard branching. Mild aortic arch atherosclerotic calcifications. Imaged portion shows no evidence of aneurysm or dissection. No significant stenosis of the major arch vessel origins. Right carotid system: No evidence of dissection, stenosis (50% or greater) or occlusion. Proximal CCA atherosclerotic calcifications with 20% luminal narrowing. Carotid bifurcation atherosclerotic calcifications. Left carotid system: No evidence of dissection, stenosis (50% or greater) or occlusion. Circumferential proximal CCA atherosclerotic calcifications with 30% luminal narrowing. Carotid bifurcation atherosclerotic calcifications with approximately 40% narrowing of the proximal ICA. Vertebral arteries: Codominant. No evidence of dissection, stenosis (50% or greater) or occlusion. Skeleton: No acute or suspicious osseous abnormalities. Other neck: No adenopathy.  No soft tissue mass. Upper chest: Dependent atelectasis. Prominent to mildly enlarged superior mediastinal nodes. Review of the MIP images confirms the above findings CTA HEAD FINDINGS Anterior circulation: Bilateral carotid siphon atherosclerotic calcifications without significant narrowing of the intracavernous segments. No significant stenosis, proximal occlusion, aneurysm, or vascular malformation. Posterior circulation: Codominant vertebral arteries. Fetal origin of the bilateral PCAs. No significant stenosis, proximal occlusion, aneurysm, or vascular malformation. Venous sinuses: As permitted by contrast timing, patent. Anatomic variants: Detailed above. Review of the MIP images confirms the above findings IMPRESSION: CT head: Right putamen/external capsule intraparenchymal hematoma measuring 3.3 cm, less conspicuous than prior exam. Trace intraventricular hemorrhage involving the  right lateral ventricle is unchanged. Left insular subarachnoid hemorrhage, unchanged. CTA head and neck: 40% proximal left ICA narrowing. 20-30% narrowing of the bilateral common carotids. No large vessel occlusion, high-grade narrowing, dissection or aneurysm involving the major intracranial vessels. Electronically Signed   By: Primitivo Gauze M.D.   On: 10/04/2019 17:39   CT HEAD WO CONTRAST  Result Date: 10/03/2019 CLINICAL DATA:  Stroke, follow-up. EXAM: CT HEAD WITHOUT CONTRAST TECHNIQUE: Contiguous axial images were obtained from the base of the skull through the vertex without intravenous contrast. COMPARISON:  10/03/2019 head CT. FINDINGS: Brain: 3.5 x 3.2 cm focal intraparenchymal hemorrhage within the right putamen and external capsule is grossly unchanged in size. Similar appearance of peripheral edema. Focal subarachnoid hemorrhage overlying the left insula is more conspicuous than prior exam. There is new intraventricular extension of hemorrhage involving the right lateral ventricle. Remote left frontal white matter insult, unchanged. No new hypodensity. No mass lesion. No midline shift, ventriculomegaly or extra-axial fluid collection. Vascular: No hyperdense vessel. Bilateral skull base atherosclerotic calcifications. Skull: No acute finding. Sinuses/Orbits: No acute finding. Other: None. IMPRESSION: Right putamen/external capsule intraparenchymal hematoma measuring 3.5 cm is unchanged. New intraventricular extension of hemorrhage involving the right lateral ventricle. Subarachnoid hemorrhage overlying the left insula is more conspicuous than prior exam. Electronically Signed   By: Milus Mallick.D.  On: 10/03/2019 19:09   CT ANGIO NECK W OR WO CONTRAST  Result Date: 10/04/2019 CLINICAL DATA:  CTA head and neck. EXAM: CT ANGIOGRAPHY HEAD AND NECK TECHNIQUE: Multidetector CT imaging of the head and neck was performed using the standard protocol during bolus administration of  intravenous contrast. Multiplanar CT image reconstructions and MIPs were obtained to evaluate the vascular anatomy. Carotid stenosis measurements (when applicable) are obtained utilizing NASCET criteria, using the distal internal carotid diameter as the denominator. CONTRAST:  78mL OMNIPAQUE IOHEXOL 350 MG/ML SOLN COMPARISON:  10/03/2019 head CTs. FINDINGS: CT HEAD FINDINGS Brain: 3.3 x 3.1 cm intraparenchymal hemorrhage within the right putamen and external capsule is slightly less conspicuous than prior exam, previously 3.5 x 3.2 cm. Minimal intraventricular extension involving the right lateral ventricle is unchanged. Left insular subarachnoid hemorrhage is unchanged. Remote left frontal white matter insult. No mass lesion. No midline shift, ventriculomegaly or extra-axial fluid collection. Vascular: Please see CTA. Skull: Negative for fracture or focal lesion. Sinuses/Orbits: Normal orbits. Clear paranasal sinuses. No mastoid effusion. Other: None. Review of the MIP images confirms the above findings CTA NECK FINDINGS Aortic arch: Standard branching. Mild aortic arch atherosclerotic calcifications. Imaged portion shows no evidence of aneurysm or dissection. No significant stenosis of the major arch vessel origins. Right carotid system: No evidence of dissection, stenosis (50% or greater) or occlusion. Proximal CCA atherosclerotic calcifications with 20% luminal narrowing. Carotid bifurcation atherosclerotic calcifications. Left carotid system: No evidence of dissection, stenosis (50% or greater) or occlusion. Circumferential proximal CCA atherosclerotic calcifications with 30% luminal narrowing. Carotid bifurcation atherosclerotic calcifications with approximately 40% narrowing of the proximal ICA. Vertebral arteries: Codominant. No evidence of dissection, stenosis (50% or greater) or occlusion. Skeleton: No acute or suspicious osseous abnormalities. Other neck: No adenopathy.  No soft tissue mass. Upper chest:  Dependent atelectasis. Prominent to mildly enlarged superior mediastinal nodes. Review of the MIP images confirms the above findings CTA HEAD FINDINGS Anterior circulation: Bilateral carotid siphon atherosclerotic calcifications without significant narrowing of the intracavernous segments. No significant stenosis, proximal occlusion, aneurysm, or vascular malformation. Posterior circulation: Codominant vertebral arteries. Fetal origin of the bilateral PCAs. No significant stenosis, proximal occlusion, aneurysm, or vascular malformation. Venous sinuses: As permitted by contrast timing, patent. Anatomic variants: Detailed above. Review of the MIP images confirms the above findings IMPRESSION: CT head: Right putamen/external capsule intraparenchymal hematoma measuring 3.3 cm, less conspicuous than prior exam. Trace intraventricular hemorrhage involving the right lateral ventricle is unchanged. Left insular subarachnoid hemorrhage, unchanged. CTA head and neck: 40% proximal left ICA narrowing. 20-30% narrowing of the bilateral common carotids. No large vessel occlusion, high-grade narrowing, dissection or aneurysm involving the major intracranial vessels. Electronically Signed   By: Primitivo Gauze M.D.   On: 10/04/2019 17:39   Chest Port 1 View  Result Date: 10/03/2019 CLINICAL DATA:  Code stroke, cough. EXAM: PORTABLE CHEST 1 VIEW COMPARISON:  08/24/2019 FINDINGS: Mild enlargement the cardiac silhouette. No consolidation. Left basilar atelectasis. The visualized skeletal structures are unremarkable. Similar positioning of a left-sided venous catheter with the distal tip projecting at the right atrium. IMPRESSION: 1. No acute cardiopulmonary disease. 2. Mild left basilar atelectasis. 3. Mild cardiomegaly. 4. Similar position of the left-sided central venous catheter. Electronically Signed   By: Margaretha Sheffield MD   On: 10/03/2019 09:08   ECHOCARDIOGRAM COMPLETE  Result Date: 10/04/2019    ECHOCARDIOGRAM  REPORT   Patient Name:   Jorge [name] Date of Exam: 10/04/2019 Medical Rec #:  [id number]  Height:       71.0 in Accession #:    [id number]     Weight:       158.1 lb Date of Birth:  August 09, 1970      BSA:          1.908 m Patient Age:    12 years       BP:           120/58 mmHg Patient Gender: M              HR:           67 bpm. Exam Location:  Inpatient Procedure: 2D Echo, Color Doppler and Cardiac Doppler Indications:    Stroke i163.9  History:        Patient has no prior history of Echocardiogram examinations.                 Risk Factors:Hypertension and Diabetes.  Sonographer:    Raquel Sarna Senior RDCS Referring Phys: Villanueva  1. Left ventricular ejection fraction, by estimation, is 60 to 65%. The left ventricle has normal function. The left ventricle has no regional wall motion abnormalities. There is moderate concentric left ventricular hypertrophy. Left ventricular diastolic parameters are consistent with Grade I diastolic dysfunction (impaired relaxation).  2. Right ventricular systolic function is normal. The right ventricular size is normal. There is normal pulmonary artery systolic pressure.  3. The mitral valve is normal in structure. Trivial mitral valve regurgitation. No evidence of mitral stenosis.  4. The aortic valve is normal in structure. Aortic valve regurgitation is not visualized. No aortic stenosis is present.  5. The inferior vena cava is normal in size with greater than 50% respiratory variability, suggesting right atrial pressure of 3 mmHg. FINDINGS  Left Ventricle: Left ventricular ejection fraction, by estimation, is 60 to 65%. The left ventricle has normal function. The left ventricle has no regional wall motion abnormalities. The left ventricular internal cavity size was normal in size. There is  moderate concentric left ventricular hypertrophy. Left ventricular diastolic parameters are consistent with Grade I diastolic dysfunction (impaired relaxation). Normal  left ventricular filling pressure. Right Ventricle: The right ventricular size is normal. No increase in right ventricular wall thickness. Right ventricular systolic function is normal. There is normal pulmonary artery systolic pressure. The tricuspid regurgitant velocity is 2.28 m/s, and  with an assumed right atrial pressure of 3 mmHg, the estimated right ventricular systolic pressure is 05.3 mmHg. Left Atrium: Left atrial size was normal in size. Right Atrium: Right atrial size was normal in size. Pericardium: There is no evidence of pericardial effusion. Mitral Valve: The mitral valve is normal in structure. Trivial mitral valve regurgitation. No evidence of mitral valve stenosis. Tricuspid Valve: The tricuspid valve is normal in structure. Tricuspid valve regurgitation is not demonstrated. No evidence of tricuspid stenosis. Aortic Valve: The aortic valve is normal in structure. Aortic valve regurgitation is not visualized. No aortic stenosis is present. Pulmonic Valve: The pulmonic valve was normal in structure. Pulmonic valve regurgitation is not visualized. No evidence of pulmonic stenosis. Aorta: The aortic root is normal in size and structure. Venous: The inferior vena cava is normal in size with greater than 50% respiratory variability, suggesting right atrial pressure of 3 mmHg. IAS/Shunts: No atrial level shunt detected by color flow Doppler.  LEFT VENTRICLE PLAX 2D LVIDd:         4.80 cm  Diastology LVIDs:         3.10  cm  LV e' medial:    5.98 cm/s LV PW:         1.40 cm  LV E/e' medial:  8.6 LV IVS:        1.40 cm  LV e' lateral:   11.40 cm/s LVOT diam:     2.30 cm  LV E/e' lateral: 4.5 LV SV:         68 LV SV Index:   36 LVOT Area:     4.15 cm  RIGHT VENTRICLE RV S prime:     11.10 cm/s TAPSE (M-mode): 1.9 cm LEFT ATRIUM             Index       RIGHT ATRIUM           Index LA diam:        4.00 cm 2.10 cm/m  RA Area:     15.40 cm LA Vol (A2C):   57.0 ml 29.88 ml/m RA Volume:   36.20 ml  18.97  ml/m LA Vol (A4C):   51.6 ml 27.05 ml/m LA Biplane Vol: 59.3 ml 31.08 ml/m  AORTIC VALVE LVOT Vmax:   75.10 cm/s LVOT Vmean:  48.400 cm/s LVOT VTI:    0.164 m  AORTA Ao Root diam: 3.50 cm MITRAL VALVE               TRICUSPID VALVE MV Area (PHT): 2.34 cm    TR Peak grad:   20.8 mmHg MV Decel Time: 324 msec    TR Vmax:        228.00 cm/s MV E velocity: 51.40 cm/s MV A velocity: 60.80 cm/s  SHUNTS MV E/A ratio:  0.85        Systemic VTI:  0.16 m                            Systemic Diam: 2.30 cm Dani Gobble Croitoru MD Electronically signed by Sanda Klein MD Signature Date/Time: 10/04/2019/12:20:40 PM    Final    CT HEAD CODE STROKE WO CONTRAST  Result Date: 10/03/2019 CLINICAL DATA:  Code stroke. Headache and left-sided weakness with fixed gaze. Nausea and vomiting. EXAM: CT HEAD WITHOUT CONTRAST TECHNIQUE: Contiguous axial images were obtained from the base of the skull through the vertex without intravenous contrast. COMPARISON:  None. FINDINGS: Brain: 35 x 31 x 30 mm acute hematoma at the right posterior putamen and external capsule. No intraventricular extension but there is a small volume of subarachnoid blood locally. There is a small rim of adjacent edema without gross underlying cortical infarct or mass. No hydrocephalus. Small chronic insult at the inferior left frontal cortex which could be ischemic or traumatic. Vascular: No hyperdense vessel or unexpected calcification. Skull: Normal. Negative for fracture or focal lesion. Sinuses/Orbits: No acute finding. Other: Critical Value/emergent results were called by telephone at the time of interpretation on 10/03/2019 at 8:20 am to provider Rory Percy, who verbally acknowledged these results. ASPECTS [facility] Stroke Program Early CT Score) Not scored in this setting IMPRESSION: 1. 15 cc acute hematoma at the right putamen and adjacent white matter. Small volume subarachnoid extension seen locally. 2. Small remote left frontal cortex insult, usually infarct. Small  remote left cerebellar infarct. Electronically Signed   By: Monte Fantasia M.D.   On: 10/03/2019 08:21     Subjective: No acute issues or events overnight denies nausea, vomiting, diarrhea, constipation, headache, fevers, chills.   Discharge Exam: Vitals:   10/15/19 0815  10/15/19 0830  BP:    Pulse:    Resp: 10 12  Temp:    SpO2:     Vitals:   10/15/19 0745 10/15/19 0800 10/15/19 0815 10/15/19 0830  BP: 137/73     Pulse:      Resp: 11 10 10 12   Temp:      TempSrc:      SpO2:      Weight:      Height:        General:  Pleasantly resting in bed, No acute distress. HEENT:  Normocephalic atraumatic.  Sclerae nonicteric, noninjected.  Extraocular movements intact bilaterally. Neck:  Without mass or deformity.  Trachea is midline. Lungs:  Clear to auscultate bilaterally without rhonchi, wheeze, or rales. Heart:  Regular rate and rhythm.  Without murmurs, rubs, or gallops. Abdomen:  Soft, nontender, nondistended.  Without guarding or rebound. Extremities: Without cyanosis, clubbing, edema, bilateral BKA Vascular:  Dorsalis pedis and posterior tibial pulses palpable bilaterally. Skin:  Warm and dry, no erythema, no ulcerations.   The results of significant diagnostics from this hospitalization (including imaging, microbiology, ancillary and laboratory) are listed below for reference.     Microbiology: Recent Results (from the past 240 hour(s))  C Difficile Quick Screen w PCR reflex     Status: Abnormal   Collection Time: 10/05/19  9:41 PM   Specimen: STOOL  Result Value Ref Range Status   C Diff antigen POSITIVE (A) NEGATIVE Final   C Diff toxin NEGATIVE NEGATIVE Final   C Diff interpretation Results are indeterminate. See PCR results.  Final    Comment: Performed at [facility], [place] [address] [place], [place] [address]  C. Diff by PCR, Reflexed     Status: Abnormal   Collection Time: 10/05/19  9:41 PM  Result Value Ref Range Status   Toxigenic C. Difficile by  PCR POSITIVE (A) NEGATIVE Final    Comment: Positive for toxigenic C. difficile with little to no toxin production. Only treat if clinical presentation suggests symptomatic illness. Performed at [facility], [place] [address], [place], [place] [address]   SARS Coronavirus 2 by RT PCR (hospital order, performed in [facility] hospital lab) Nasopharyngeal Nasopharyngeal Swab     Status: None   Collection Time: 10/08/19  2:33 PM   Specimen: Nasopharyngeal Swab  Result Value Ref Range Status   SARS Coronavirus 2 NEGATIVE NEGATIVE Final    Comment: (NOTE) SARS-CoV-2 target nucleic acids are NOT DETECTED.  The SARS-CoV-2 RNA is generally detectable in upper and lower respiratory specimens during the acute phase of infection. The lowest concentration of SARS-CoV-2 viral copies this assay can detect is 250 copies / mL. A negative result does not preclude SARS-CoV-2 infection and should not be used as the sole basis for treatment or other patient management decisions.  A negative result may occur with improper specimen collection / handling, submission of specimen other than nasopharyngeal swab, presence of viral mutation(s) within the areas targeted by this assay, and inadequate number of viral copies (<250 copies / mL). A negative result must be combined with clinical observations, patient history, and epidemiological information.  Fact Sheet for Patients:   StrictlyIdeas.no  Fact Sheet for Healthcare Providers: BankingDealers.co.za  This test is not yet approved or  cleared by the [place] FDA and has been authorized for detection and/or diagnosis of SARS-CoV-2 by FDA under an Emergency Use Authorization (EUA).  This EUA will remain in effect (meaning this test can be  used) for the duration of the COVID-19 declaration under Section 564(b)(1) of the Act, 21 U.S.C. section 360bbb-3(b)(1), unless the authorization is terminated  or revoked sooner.  Performed at [facility], [place] [address], [place], [place] [address]   Culture, blood (routine x 2)     Status: None (Preliminary result)   Collection Time: 10/10/19  1:14 AM   Specimen: BLOOD  Result Value Ref Range Status   Specimen Description BLOOD LEFT ARM  Final   Special Requests   Final    BOTTLES DRAWN AEROBIC AND ANAEROBIC Blood Culture adequate volume   Culture   Final    NO GROWTH 4 DAYS Performed at [facility], [place] [address], [place], [place] [address]    Report Status PENDING  Incomplete  Culture, blood (routine x 2)     Status: None (Preliminary result)   Collection Time: 10/10/19  1:19 AM   Specimen: BLOOD  Result Value Ref Range Status   Specimen Description BLOOD LEFT HAND  Final   Special Requests   Final    BOTTLES DRAWN AEROBIC AND ANAEROBIC Blood Culture adequate volume   Culture   Final    NO GROWTH 4 DAYS Performed at [facility], [place] [address], [place], [place] [address]    Report Status PENDING  Incomplete     Labs: BNP (last 3 results) No results for input(s): BNP in the last 8760 hours. Basic Metabolic Panel: Recent Labs  Lab 10/09/19 1219 10/10/19 0730 10/13/19 0834  NA 135 136 133*  K 4.8 5.1 6.6*  CL 99 98 98  CO2 24 26 23   GLUCOSE 106* 179* 127*  BUN 35* 52* 50*  CREATININE 6.18* 8.05* 10.20*  CALCIUM 8.8* 9.0 9.2  PHOS 3.8 4.7* 3.8   Liver Function Tests: Recent Labs  Lab 10/09/19 1219 10/10/19 0730 10/13/19 0834  ALBUMIN 2.9* 2.7* 2.6*   No results for input(s): LIPASE, AMYLASE in the last 168 hours. No results for input(s): AMMONIA in the last 168 hours. CBC: Recent Labs  Lab 10/10/19 0729 10/13/19 0835 10/15/19 0822  WBC 6.2 7.3 5.4  HGB 8.2* 8.1* 8.5*  HCT 27.5* 26.6* 28.5*  MCV 95.2 93.3 93.1  PLT 123* 160 165   Cardiac Enzymes: No results for input(s): CKTOTAL, CKMB, CKMBINDEX, TROPONINI in the last 168 hours. BNP: Invalid input(s): POCBNP CBG: Recent Labs   Lab 10/14/19 0804 10/14/19 1235 10/14/19 1657 10/14/19 2136 10/15/19 0607  GLUCAP 129* 158* 180* 146* 177*   D-Dimer No results for input(s): DDIMER in the last 72 hours. Hgb A1c No results for input(s): HGBA1C in the last 72 hours. Lipid Profile No results for input(s): CHOL, HDL, LDLCALC, TRIG, CHOLHDL, LDLDIRECT in the last 72 hours. Thyroid function studies No results for input(s): TSH, T4TOTAL, T3FREE, THYROIDAB in the last 72 hours.  Invalid input(s): FREET3 Anemia work up No results for input(s): VITAMINB12, FOLATE, FERRITIN, TIBC, IRON, RETICCTPCT in the last 72 hours. Urinalysis    Component Value Date/Time   COLORURINE YELLOW 08/22/2019 1154   APPEARANCEUR CLEAR 08/22/2019 1154   LABSPEC 1.010 08/22/2019 1154   PHURINE 5.0 08/22/2019 1154   GLUCOSEU >=500 (A) 08/22/2019 1154   HGBUR MODERATE (A) 08/22/2019 1154   BILIRUBINUR NEGATIVE 08/22/2019 1154   KETONESUR 5 (A) 08/22/2019 1154   PROTEINUR 100 (A) 08/22/2019 1154   NITRITE NEGATIVE 08/22/2019 1154   LEUKOCYTESUR SMALL (A) 08/22/2019 1154   Sepsis Labs Invalid input(s): PROCALCITONIN,  WBC,  LACTICIDVEN Microbiology  Recent Results (from the past 240 hour(s))  C Difficile Quick Screen w PCR reflex     Status: Abnormal   Collection Time: 10/05/19  9:41 PM   Specimen: STOOL  Result Value Ref Range Status   C Diff antigen POSITIVE (A) NEGATIVE Final   C Diff toxin NEGATIVE NEGATIVE Final   C Diff interpretation Results are indeterminate. See PCR results.  Final    Comment: Performed at [facility], [place] [address] [place], [place] [address]  C. Diff by PCR, Reflexed     Status: Abnormal   Collection Time: 10/05/19  9:41 PM  Result Value Ref Range Status   Toxigenic C. Difficile by PCR POSITIVE (A) NEGATIVE Final    Comment: Positive for toxigenic C. difficile with little to no toxin production. Only treat if clinical presentation suggests symptomatic illness. Performed at [facility],  [place] [address] [place], [place] [address]   SARS Coronavirus 2 by RT PCR (hospital order, performed in [facility] hospital lab) Nasopharyngeal Nasopharyngeal Swab     Status: None   Collection Time: 10/08/19  2:33 PM   Specimen: Nasopharyngeal Swab  Result Value Ref Range Status   SARS Coronavirus 2 NEGATIVE NEGATIVE Final    Comment: (NOTE) SARS-CoV-2 target nucleic acids are NOT DETECTED.  The SARS-CoV-2 RNA is generally detectable in upper and lower respiratory specimens during the acute phase of infection. The lowest concentration of SARS-CoV-2 viral copies this assay can detect is 250 copies / mL. A negative result does not preclude SARS-CoV-2 infection and should not be used as the sole basis for treatment or other patient management decisions.  A negative result may occur with improper specimen collection / handling, submission of specimen other than nasopharyngeal swab, presence of viral mutation(s) within the areas targeted by this assay, and inadequate number of viral copies (<250 copies / mL). A negative result must be combined with clinical observations, patient history, and epidemiological information.  Fact Sheet for Patients:   StrictlyIdeas.no  Fact Sheet for Healthcare Providers: BankingDealers.co.za  This test is not yet approved or  cleared by the [place] FDA and has been authorized for detection and/or diagnosis of SARS-CoV-2 by FDA under an Emergency Use Authorization (EUA).  This EUA will remain in effect (meaning this test can be used) for the duration of the COVID-19 declaration under Section 564(b)(1) of the Act, 21 U.S.C. section 360bbb-3(b)(1), unless the authorization is terminated or revoked sooner.  Performed at [facility], [place] [address] [place], [place] [address]   Culture, blood (routine x 2)     Status: None (Preliminary result)   Collection Time: 10/10/19  1:14 AM   Specimen: BLOOD   Result Value Ref Range Status   Specimen Description BLOOD LEFT ARM  Final   Special Requests   Final    BOTTLES DRAWN AEROBIC AND ANAEROBIC Blood Culture adequate volume   Culture   Final    NO GROWTH 4 DAYS Performed at [facility], [place] [address] [place], [place] [address]    Report Status PENDING  Incomplete  Culture, blood (routine x 2)     Status: None (Preliminary result)   Collection Time: 10/10/19  1:19 AM   Specimen: BLOOD  Result Value Ref Range Status   Specimen Description BLOOD LEFT HAND  Final   Special Requests   Final    BOTTLES DRAWN AEROBIC AND ANAEROBIC Blood Culture adequate volume   Culture   Final  NO GROWTH 4 DAYS Performed at [facility], [place] [address], [place], [place] [address]    Report Status PENDING  Incomplete     Time coordinating discharge: Over 30 minutes  SIGNED:   Little Ishikawa, DO [facility] 10/15/2019, 8:40 AM Pager   If 7PM-7AM, please contact night-coverage [email or web address]

## 2019-10-15 NOTE — Progress Notes (Signed)
Pt left for dialysis on portable monitor. Monitor room aware.

## 2019-10-15 NOTE — Progress Notes (Signed)
Patient has been accepted at [facility] on MWF schedule with a seat time of 12:30pm in order to accommodate discharge to SNF. He needs to arrive at his appointments at 12:10pm.  On his first day at the clinic, patient needs to arrive at 11:30am in order to complete new intake paperwork prior to his first treatment. The clinic is prepared to start patient on Monday, 10/19/19. We will schedule patient for inpt HD today and tomorrow in order to get patient on MWF schedule prior to discharge. This is a permanent transfer, however, patient has been informed that he can request a transfer back to his previous clinic at any time if a seat is available.  [facility] [address], [place], [place]  Please contact Renal Navigator with any questions.  Alphonzo Cruise, [place] Renal Navigator [phone]

## 2019-10-15 NOTE — Progress Notes (Signed)
[facility] Progress Note   Subjective:  Seen on HD, no c/o  Objective Vitals:   10/15/19 1059 10/15/19 1103 10/15/19 1108 10/15/19 1113  BP:   119/74 118/70  Pulse:      Resp: 11 13 17 13   Temp:    (!) 97.5 F (36.4 C)  TempSrc:    Oral  SpO2:    95%  Weight:    67 kg  Height:       Physical Exam General: Well appearing man,  no c/o Heart: RRR; no murmur Lungs: CTA anteriorly Abdomen: soft, non-tender Extremities: B BKA, no stump edema Dialysis Access: TDC in L chest, RUE AVF + thrill   Dialysis:  [facility] 4h 68kg 2/2.5 bath  R BVT AVF+ L IJ TDC   Hep none   -  ok for heparin block to [facility], was on hep 4200u on hold due to bleed   - R AVF placed 08/28/19, maturing    - mircera 200 q 2 last 9/16 - venofer 100 x 3 more doses - calcitriol 1 ug tiw Recent labs: hgb 9 trending up tsat 18%  Assessment/Plan: 1.Hemorrhagic R brain CVA - per Neurology. No heparin with HD. Ok to use to lock [facility]. 2. ESRD- Continue HD per TTS sched. HD today. CLIP's to [facility] on MWF schedule. To get on MWF will dialyze again tomorrow am.  3. Anemiaof CKD-Hgb 8.2. Last tsat 9%, on Venofer course + ESA. 4. Secondary hyperparathyroidism- Ca and phos in goal. Continue binders and VDRA - renvela decreased to 2AC TID due to initial low phos.  5.HTN/volume- s/p IV cleviprex. Per primary/stroke team in setting of CVA. Initial goal to keep SBP <140, now <160. BP's soft, he is now below dry wt. Will lower BP meds by 50% and dc po lasix  6. Nutrition- Alb low, continue supplements 7.C diff colitis- per primary, on PO vanc 8.DMT2- per primary. On insulin 9. [name]- Plan is for SNF d/c on Friday   Rob Ponshewaing, MD 10/14/2019, 1:30 PM  Additional Objective Labs: Basic Metabolic Panel: Recent Labs  Lab 10/10/19 0730 10/13/19 0834 10/15/19 0822  NA 136 133* 137  K 5.1 6.6* 4.5  CL 98 98 99  CO2 26 23 25   GLUCOSE 179* 127* 199*  BUN 52* 50* 39*   CREATININE 8.05* 10.20* 8.66*  CALCIUM 9.0 9.2 8.8*  PHOS 4.7* 3.8 5.1*   Liver Function Tests: Recent Labs  Lab 10/10/19 0730 10/13/19 0834 10/15/19 0822  ALBUMIN 2.7* 2.6* 2.6*   CBC: Recent Labs  Lab 10/10/19 0729 10/13/19 0835 10/15/19 0822  WBC 6.2 7.3 5.4  HGB 8.2* 8.1* 8.5*  HCT 27.5* 26.6* 28.5*  MCV 95.2 93.3 93.1  PLT 123* 160 165   Blood Culture    Component Value Date/Time   SDES BLOOD LEFT HAND 10/10/2019 0119   SPECREQUEST  10/10/2019 0119    BOTTLES DRAWN AEROBIC AND ANAEROBIC Blood Culture adequate volume   CULT  10/10/2019 0119    NO GROWTH 4 DAYS Performed at [facility], [place] [address], [place], [place] [address]    REPTSTATUS PENDING 10/10/2019 0119   Medications:  . amLODipine  10 mg Oral Daily  . atorvastatin  20 mg Oral QHS  . calcitRIOL  1 mcg Oral Q T,Th,Sa-HD  . carvedilol  12.5 mg Oral BID WC  . Chlorhexidine Gluconate Cloth  6 each Topical Q0600  . darbepoetin (ARANESP) injection - DIALYSIS  100 mcg Intravenous Q Sat-HD  .  feeding supplement (NEPRO CARB STEADY)  237 mL Oral BID BM  . furosemide  20 mg Oral Daily  . gabapentin  300 mg Oral Daily  . insulin aspart  0-6 Units Subcutaneous TID WC  . insulin glargine  5 Units Subcutaneous Daily  . multivitamin  1 tablet Oral QHS  . pantoprazole  40 mg Oral QHS  . senna-docusate  1 tablet Oral BID  . sevelamer carbonate  1,600 mg Oral TID WC  . vancomycin  125 mg Oral QID  . Vitamin D (Ergocalciferol)  50,000 Units Oral Q Sat

## 2019-10-15 NOTE — TOC Progression Note (Signed)
Transition of Care [facility]) - Progression Note    Patient Details  Name: Jorge [name] MRN: [id number] Date of Birth: 1970-12-07  [facility]) CM/SW Contact  Joanne Chars, LCSW Phone Number: 10/15/2019, 1:32 PM  Clinical Narrative:  CSW spoke with pt and confirmed that he is in agreement with accepting bed at [facility].  Pt confirmed and said he was hoping to go today.  CSW spoke with Dedra at [facility] and they an accept pt after dialysis tomorrow.  Auth paperwork submitted to [facility].  Dr Avon Gully ordering covid test today.      Expected Discharge Plan: [place] Barriers to Discharge: Continued Medical Work up, SNF Pending bed offer  Expected Discharge Plan and Services Expected Discharge Plan: [place] Choice: [place] arrangements for the past 2 months: Single Family Home                                       Social Determinants of Health (SDOH) Interventions    Readmission Risk Interventions No flowsheet data found.

## 2019-10-16 DIAGNOSIS — N2581 Secondary hyperparathyroidism of renal origin: Secondary | ICD-10-CM | POA: Diagnosis not present

## 2019-10-16 DIAGNOSIS — I69120 Aphasia following nontraumatic intracerebral hemorrhage: Secondary | ICD-10-CM | POA: Diagnosis not present

## 2019-10-16 DIAGNOSIS — D631 Anemia in chronic kidney disease: Secondary | ICD-10-CM | POA: Diagnosis not present

## 2019-10-16 DIAGNOSIS — Z23 Encounter for immunization: Secondary | ICD-10-CM | POA: Diagnosis not present

## 2019-10-16 DIAGNOSIS — I150 Renovascular hypertension: Secondary | ICD-10-CM | POA: Diagnosis not present

## 2019-10-16 DIAGNOSIS — I610 Nontraumatic intracerebral hemorrhage in hemisphere, subcortical: Secondary | ICD-10-CM | POA: Diagnosis not present

## 2019-10-16 DIAGNOSIS — I120 Hypertensive chronic kidney disease with stage 5 chronic kidney disease or end stage renal disease: Secondary | ICD-10-CM | POA: Diagnosis not present

## 2019-10-16 DIAGNOSIS — Z992 Dependence on renal dialysis: Secondary | ICD-10-CM | POA: Diagnosis not present

## 2019-10-16 DIAGNOSIS — D509 Iron deficiency anemia, unspecified: Secondary | ICD-10-CM | POA: Diagnosis not present

## 2019-10-16 DIAGNOSIS — E119 Type 2 diabetes mellitus without complications: Secondary | ICD-10-CM | POA: Diagnosis not present

## 2019-10-16 DIAGNOSIS — F419 Anxiety disorder, unspecified: Secondary | ICD-10-CM | POA: Diagnosis not present

## 2019-10-16 DIAGNOSIS — I10 Essential (primary) hypertension: Secondary | ICD-10-CM | POA: Diagnosis not present

## 2019-10-16 DIAGNOSIS — A0472 Enterocolitis due to Clostridium difficile, not specified as recurrent: Secondary | ICD-10-CM | POA: Diagnosis not present

## 2019-10-16 DIAGNOSIS — I619 Nontraumatic intracerebral hemorrhage, unspecified: Secondary | ICD-10-CM | POA: Diagnosis not present

## 2019-10-16 DIAGNOSIS — N186 End stage renal disease: Secondary | ICD-10-CM | POA: Diagnosis not present

## 2019-10-16 DIAGNOSIS — F5104 Psychophysiologic insomnia: Secondary | ICD-10-CM | POA: Diagnosis not present

## 2019-10-16 DIAGNOSIS — R278 Other lack of coordination: Secondary | ICD-10-CM | POA: Diagnosis not present

## 2019-10-16 DIAGNOSIS — K3184 Gastroparesis: Secondary | ICD-10-CM | POA: Diagnosis not present

## 2019-10-16 DIAGNOSIS — T8249XA Other complication of vascular dialysis catheter, initial encounter: Secondary | ICD-10-CM | POA: Diagnosis not present

## 2019-10-16 DIAGNOSIS — E8770 Fluid overload, unspecified: Secondary | ICD-10-CM | POA: Diagnosis not present

## 2019-10-16 DIAGNOSIS — E1122 Type 2 diabetes mellitus with diabetic chronic kidney disease: Secondary | ICD-10-CM | POA: Diagnosis not present

## 2019-10-16 DIAGNOSIS — I639 Cerebral infarction, unspecified: Secondary | ICD-10-CM | POA: Diagnosis not present

## 2019-10-16 DIAGNOSIS — Z794 Long term (current) use of insulin: Secondary | ICD-10-CM | POA: Diagnosis not present

## 2019-10-16 DIAGNOSIS — M6281 Muscle weakness (generalized): Secondary | ICD-10-CM | POA: Diagnosis not present

## 2019-10-16 DIAGNOSIS — R2689 Other abnormalities of gait and mobility: Secondary | ICD-10-CM | POA: Diagnosis not present

## 2019-10-16 DIAGNOSIS — I629 Nontraumatic intracranial hemorrhage, unspecified: Secondary | ICD-10-CM | POA: Diagnosis not present

## 2019-10-16 LAB — CBC
HCT: 30.3 % — ABNORMAL LOW (ref 39.0–52.0)
Hemoglobin: 9.1 g/dL — ABNORMAL LOW (ref 13.0–17.0)
MCH: 28 pg (ref 26.0–34.0)
MCHC: 30 g/dL (ref 30.0–36.0)
MCV: 93.2 fL (ref 80.0–100.0)
Platelets: 157 10*3/uL (ref 150–400)
RBC: 3.25 MIL/uL — ABNORMAL LOW (ref 4.22–5.81)
RDW: 14.8 % (ref 11.5–15.5)
WBC: 6 10*3/uL (ref 4.0–10.5)
nRBC: 0 % (ref 0.0–0.2)

## 2019-10-16 LAB — RENAL FUNCTION PANEL
Albumin: 2.6 g/dL — ABNORMAL LOW (ref 3.5–5.0)
Anion gap: 11 (ref 5–15)
BUN: 23 mg/dL — ABNORMAL HIGH (ref 6–20)
CO2: 25 mmol/L (ref 22–32)
Calcium: 9 mg/dL (ref 8.9–10.3)
Chloride: 98 mmol/L (ref 98–111)
Creatinine, Ser: 5.88 mg/dL — ABNORMAL HIGH (ref 0.61–1.24)
GFR calc non Af Amer: 10 mL/min — ABNORMAL LOW (ref >60)
Glucose, Bld: 191 mg/dL — ABNORMAL HIGH (ref 70–99)
Phosphorus: 4.9 mg/dL — ABNORMAL HIGH (ref 2.5–4.6)
Potassium: 4.2 mmol/L (ref 3.5–5.1)
Sodium: 134 mmol/L — ABNORMAL LOW (ref 135–145)

## 2019-10-16 LAB — GLUCOSE, CAPILLARY: Glucose-Capillary: 116 mg/dL — ABNORMAL HIGH (ref 70–99)

## 2019-10-16 MED ORDER — HEPARIN SODIUM (PORCINE) 1000 UNIT/ML IJ SOLN
INTRAMUSCULAR | Status: AC
  Administered 2019-10-16: 1000 [IU]
  Filled 2019-10-16: qty 5

## 2019-10-16 MED ORDER — CALCITRIOL 0.5 MCG PO CAPS
ORAL_CAPSULE | ORAL | Status: AC
  Filled 2019-10-16: qty 2

## 2019-10-16 MED ORDER — CARVEDILOL 6.25 MG PO TABS
6.2500 mg | ORAL_TABLET | Freq: Two times a day (BID) | ORAL | 0 refills | Status: DC
Start: 2019-10-16 — End: 2019-12-31

## 2019-10-16 MED ORDER — AMLODIPINE BESYLATE 5 MG PO TABS
5.0000 mg | ORAL_TABLET | Freq: Every day | ORAL | 0 refills | Status: DC
Start: 2019-10-16 — End: 2019-11-19

## 2019-10-16 NOTE — Progress Notes (Signed)
Dialysis nurse called and gave report. 500 ML's removed, HD dressing changed, last set of vitals 136/64 HR: 62 Temp: 97.4 oral. Nurse stated Pt.'s BP's were dropping during dialysis.

## 2019-10-16 NOTE — Progress Notes (Signed)
OT Cancellation    10/16/19 0800  OT Visit Information  Last OT Received On 10/16/19  Reason Eval/Treat Not Completed Patient at procedure or test/ unavailable (HD)  Maurie Boettcher, OT/L   Acute OT Clinical Specialist [place] Pager [phone] Office [phone]

## 2019-10-16 NOTE — Progress Notes (Signed)
PT Cancellation Note  Patient Details Name: HENDERSON FRAMPTON MRN: [id number] DOB: 08/20/1970   Cancelled Treatment:    Reason Eval/Treat Not Completed: Patient at procedure or test/unavailable. Pt in HD. Plans are for d/c to SNF after HD today.   Lorriane Shire 10/16/2019, 7:57 AM  Lorrin Goodell, PT  Office # [phone] Pager [phone]

## 2019-10-16 NOTE — Progress Notes (Signed)
Renal Navigator spoke with patient at HD bedside to ensure he is aware of plan. He asked if he would be discharged today. Navigator confirmed with CSW that he will. Navigator provided him with his new clinic information and schedule in writing, though explained that his SNF will provide transportation while he is there. Patient stated understanding and appreciation.  Jorge [name], [place] Renal Navigator [phone]

## 2019-10-16 NOTE — Procedures (Signed)
   I was present at this dialysis session, have reviewed the session itself and made  appropriate changes Kelly Splinter MD [place] pager [phone]   10/16/2019, 2:57 PM

## 2019-10-16 NOTE — Progress Notes (Signed)
Attempted to call report no answer received form nursing staff.

## 2019-10-16 NOTE — Discharge Summary (Signed)
Physician Discharge Summary  GOR VESTAL JIR:[id number] DOB: May 29, 1970 DOA: 10/03/2019  PCP: Lauree Chandler, NP  Admit date: 10/03/2019 Discharge date: 10/16/2019  Admitted From: Home Disposition: SNF  Recommendations for Outpatient Follow-up:  1. Follow up with PCP in 1-2 weeks 2. Neurology as scheduled 3. Continue to follow with dialysis on Monday Wednesday Friday urinary schedule as discussed   Discharge Condition: Stable CODE STATUS: Full Diet recommendation:    Diet renal/carb modified with fluid restriction Diet-HS Snack? Nothing; Fluid restriction: 1200 mL Fluid; Room service appropriate? Yes with Assist; Fluid consistency: Thin         Brief/Interim Summary: Delontae D Davisis a 49 y.o.malepast medical history of diabetes with gastroparesis, hypertension, BKA, ESRD on peritoneal dialysis, with no prior medical history of strokes, presented to the emergency room for sudden onset of left-sided weakness and confusion. Last known normal 9 PM on 10/02/2019. Seen again by family this morning and speech was slurred. Was complaining of a headache. Appeared more drowsy to family. EMS was called. On EMS evaluation, exhibited symptoms of left-sided weakness and fast ED calculator showing an LVO score of 4 prompting activation of an LVO positive code stroke. Brought into the emergency room, evaluated at the bridge-NIH stroke scale of 11 consistent with a right hemispheric stroke. Stat CT performed, consistent with 15 cc hematoma in the right putamen and adjacent white matter with a small volume subarachnoid extension locally. Small remote left frontal cortex insult and small remote left cerebellar infarct. Upon arrival: Blood sugars in the 75Z, systolic blood pressure 025E. Given D50 prior to CT.  Patient admitted as above with left-sided weakness and confusion, left frontal cortex and small left cerebellar infarcts with small intracranial as well as subarachnoid hemorrhages.  He  recovered quite well, PT continues to recommend SNF placement, he is otherwise stable and agreeable for SNF, he had been diagnosed with C. difficile, will complete his vancomycin through 10/15/2019 with essentially no ongoing diarrhea or loose stool.  At this point the issue for discharge is that patient is on a Tuesday Thursday Saturday dialysis schedule and SNF cannot provide transport on Saturday thus we are attempting to switch patient to Monday Wednesday Friday schedule with hopes to improve compliance with dialysis at facility.   Patient now in agreement to switch dialysis schedule to Monday Wednesday Friday which will help with placement, otherwise medically stable for discharge to SNF   Discharge Diagnoses:  Active Problems:   ICH (intracerebral hemorrhage) ([name])   C. difficile diarrhea    Discharge Instructions  Discharge Instructions    Ambulatory referral to Neurology   Complete by: As directed    Follow up with stroke clinic NP (Jessica Vanschaick or Cecille Rubin, if both not available, consider Zachery Dauer, or Ahern) at [facility] in about 4 weeks. Thanks.   Call MD for:  extreme fatigue   Complete by: As directed    Diet general   Complete by: As directed    Diet renal/carb modified with fluid restriction Diet-HS Snack? Nothing; Fluid restriction: 1200 mL Fluid; Room service appropriate? Yes with Assist; Fluid consistency: Thin   Increase activity slowly   Complete by: As directed      Allergies as of 10/16/2019      Reactions   Lactose Intolerance (gi) Diarrhea, Nausea Only      Medication List    STOP taking these medications   cloNIDine 0.1 MG tablet Commonly known as: CATAPRES   furosemide 20 MG tablet Commonly known as:  LASIX   oxyCODONE-acetaminophen 5-325 MG tablet Commonly known as: PERCOCET/ROXICET     TAKE these medications   amLODipine 5 MG tablet Commonly known as: NORVASC Take 1 tablet (5 mg total) by mouth daily. What changed:   medication  strength  how much to take   aspirin EC 81 MG tablet Take 81 mg by mouth daily.   atorvastatin 20 MG tablet Commonly known as: LIPITOR TAKE 1 TABLET(20 MG) BY MOUTH DAILY What changed: See the new instructions.   calcitRIOL 0.5 MCG capsule Commonly known as: ROCALTROL Take 1 capsule (0.5 mcg total) by mouth 3 (three) times a week.   carvedilol 6.25 MG tablet Commonly known as: COREG Take 1 tablet (6.25 mg total) by mouth 2 (two) times daily with a meal. What changed:   medication strength  how much to take   Darbepoetin Alfa 200 MCG/0.4ML Sosy injection Commonly known as: ARANESP Inject 0.4 mLs (200 mcg total) into the skin every Thursday at [place] [facility] Check blood sugar twice daily as directed DX E11.9 What changed:   how much to take  how to take this  when to take this   Dexcom G6 Sensor Misc Check blood sugar twice daily as directed DX E11.9 What changed:   how much to take  how to take this  when to take this   Dexcom G6 Transmitter Misc Check blood sugar twice daily as directed DX E11.9 What changed:   how much to take  how to take this  when to take this   gabapentin 300 MG capsule Commonly known as: NEURONTIN Take 1 capsule (300 mg total) by mouth daily as needed. What changed:   when to take this  reasons to take this   gentamicin cream 0.1 % Commonly known as: GARAMYCIN Apply 1 application topically daily.   Lantus SoloStar 100 UNIT/ML Solostar Pen Generic drug: insulin glargine Inject 5 Units into the skin daily. What changed: See the new instructions.   Loperamide HCl 1 MG/7.5ML Liqd Take 10 mLs by mouth every 6 (six) hours as needed for diarrhea or loose stools.   metoCLOPramide 10 MG tablet Commonly known as: REGLAN Take 1 tablet (10 mg total) by mouth every 6 (six) hours as needed for nausea or vomiting.   NovoLOG FlexPen 100 UNIT/ML FlexPen Generic drug: insulin aspart INJECT 5 UNITS INTO SKIN 3  TIMES DAILY WITH MEALS What changed: See the new instructions.   ondansetron 4 MG disintegrating tablet Commonly known as: Zofran ODT Take 1 tablet (4 mg total) by mouth every 8 (eight) hours as needed for vomiting (When you are actively vomiting and unable to tolerate Reglan).   pantoprazole 40 MG tablet Commonly known as: PROTONIX Take 1 tablet (40 mg total) by mouth at bedtime.   Pen Needles 30G X 8 MM Misc 1 each by Does not apply route in the morning, at noon, in the evening, and at bedtime.   ProRenal + D Tabs Take 1 tablet by mouth daily.   sevelamer carbonate 800 MG tablet Commonly known as: RENVELA Take 2 tablets (1,600 mg total) by mouth 3 (three) times daily with meals. What changed: how much to take   Vitamin D (Ergocalciferol) 1.25 MG (50000 UNIT) Caps capsule Commonly known as: DRISDOL Take 50,000 Units by mouth once a week. saturdays     ASK your doctor about these medications   vancomycin 50 mg/mL  oral solution Commonly known as: VANCOCIN Take 2.5 mLs (125 mg  total) by mouth 4 (four) times daily for 1 day. Ask about: Should I take this medication?       Contact information for follow-up providers    [facility]. Schedule an appointment as soon as possible for a visit in 4 week(s).   Specialty: Neurology Contact information: [address] [place] [phone]           Contact information for after-discharge care    Destination    [facility] .   Service: Skilled Nursing Contact information: [place] [address] [phone]                 Allergies  Allergen Reactions  . Lactose Intolerance (Gi) Diarrhea and Nausea Only    Consultations:  Neurology, nephrology   Procedures/Studies: CT ANGIO HEAD W OR WO CONTRAST  Result Date: 10/04/2019 CLINICAL DATA:  CTA head and neck. EXAM: CT ANGIOGRAPHY HEAD AND NECK TECHNIQUE:  Multidetector CT imaging of the head and neck was performed using the standard protocol during bolus administration of intravenous contrast. Multiplanar CT image reconstructions and MIPs were obtained to evaluate the vascular anatomy. Carotid stenosis measurements (when applicable) are obtained utilizing NASCET criteria, using the distal internal carotid diameter as the denominator. CONTRAST:  5mL OMNIPAQUE IOHEXOL 350 MG/ML SOLN COMPARISON:  10/03/2019 head CTs. FINDINGS: CT HEAD FINDINGS Brain: 3.3 x 3.1 cm intraparenchymal hemorrhage within the right putamen and external capsule is slightly less conspicuous than prior exam, previously 3.5 x 3.2 cm. Minimal intraventricular extension involving the right lateral ventricle is unchanged. Left insular subarachnoid hemorrhage is unchanged. Remote left frontal white matter insult. No mass lesion. No midline shift, ventriculomegaly or extra-axial fluid collection. Vascular: Please see CTA. Skull: Negative for fracture or focal lesion. Sinuses/Orbits: Normal orbits. Clear paranasal sinuses. No mastoid effusion. Other: None. Review of the MIP images confirms the above findings CTA NECK FINDINGS Aortic arch: Standard branching. Mild aortic arch atherosclerotic calcifications. Imaged portion shows no evidence of aneurysm or dissection. No significant stenosis of the major arch vessel origins. Right carotid system: No evidence of dissection, stenosis (50% or greater) or occlusion. Proximal CCA atherosclerotic calcifications with 20% luminal narrowing. Carotid bifurcation atherosclerotic calcifications. Left carotid system: No evidence of dissection, stenosis (50% or greater) or occlusion. Circumferential proximal CCA atherosclerotic calcifications with 30% luminal narrowing. Carotid bifurcation atherosclerotic calcifications with approximately 40% narrowing of the proximal ICA. Vertebral arteries: Codominant. No evidence of dissection, stenosis (50% or greater) or occlusion.  Skeleton: No acute or suspicious osseous abnormalities. Other neck: No adenopathy.  No soft tissue mass. Upper chest: Dependent atelectasis. Prominent to mildly enlarged superior mediastinal nodes. Review of the MIP images confirms the above findings CTA HEAD FINDINGS Anterior circulation: Bilateral carotid siphon atherosclerotic calcifications without significant narrowing of the intracavernous segments. No significant stenosis, proximal occlusion, aneurysm, or vascular malformation. Posterior circulation: Codominant vertebral arteries. Fetal origin of the bilateral PCAs. No significant stenosis, proximal occlusion, aneurysm, or vascular malformation. Venous sinuses: As permitted by contrast timing, patent. Anatomic variants: Detailed above. Review of the MIP images confirms the above findings IMPRESSION: CT head: Right putamen/external capsule intraparenchymal hematoma measuring 3.3 cm, less conspicuous than prior exam. Trace intraventricular hemorrhage involving the right lateral ventricle is unchanged. Left insular subarachnoid hemorrhage, unchanged. CTA head and neck: 40% proximal left ICA narrowing. 20-30% narrowing of the bilateral common carotids. No large vessel occlusion, high-grade narrowing, dissection or aneurysm involving the major intracranial vessels. Electronically Signed  By: Primitivo Gauze M.D.   On: 10/04/2019 17:39   CT HEAD WO CONTRAST  Result Date: 10/03/2019 CLINICAL DATA:  Stroke, follow-up. EXAM: CT HEAD WITHOUT CONTRAST TECHNIQUE: Contiguous axial images were obtained from the base of the skull through the vertex without intravenous contrast. COMPARISON:  10/03/2019 head CT. FINDINGS: Brain: 3.5 x 3.2 cm focal intraparenchymal hemorrhage within the right putamen and external capsule is grossly unchanged in size. Similar appearance of peripheral edema. Focal subarachnoid hemorrhage overlying the left insula is more conspicuous than prior exam. There is new intraventricular  extension of hemorrhage involving the right lateral ventricle. Remote left frontal white matter insult, unchanged. No new hypodensity. No mass lesion. No midline shift, ventriculomegaly or extra-axial fluid collection. Vascular: No hyperdense vessel. Bilateral skull base atherosclerotic calcifications. Skull: No acute finding. Sinuses/Orbits: No acute finding. Other: None. IMPRESSION: Right putamen/external capsule intraparenchymal hematoma measuring 3.5 cm is unchanged. New intraventricular extension of hemorrhage involving the right lateral ventricle. Subarachnoid hemorrhage overlying the left insula is more conspicuous than prior exam. Electronically Signed   By: Primitivo Gauze M.D.   On: 10/03/2019 19:09   CT ANGIO NECK W OR WO CONTRAST  Result Date: 10/04/2019 CLINICAL DATA:  CTA head and neck. EXAM: CT ANGIOGRAPHY HEAD AND NECK TECHNIQUE: Multidetector CT imaging of the head and neck was performed using the standard protocol during bolus administration of intravenous contrast. Multiplanar CT image reconstructions and MIPs were obtained to evaluate the vascular anatomy. Carotid stenosis measurements (when applicable) are obtained utilizing NASCET criteria, using the distal internal carotid diameter as the denominator. CONTRAST:  32mL OMNIPAQUE IOHEXOL 350 MG/ML SOLN COMPARISON:  10/03/2019 head CTs. FINDINGS: CT HEAD FINDINGS Brain: 3.3 x 3.1 cm intraparenchymal hemorrhage within the right putamen and external capsule is slightly less conspicuous than prior exam, previously 3.5 x 3.2 cm. Minimal intraventricular extension involving the right lateral ventricle is unchanged. Left insular subarachnoid hemorrhage is unchanged. Remote left frontal white matter insult. No mass lesion. No midline shift, ventriculomegaly or extra-axial fluid collection. Vascular: Please see CTA. Skull: Negative for fracture or focal lesion. Sinuses/Orbits: Normal orbits. Clear paranasal sinuses. No mastoid effusion. Other:  None. Review of the MIP images confirms the above findings CTA NECK FINDINGS Aortic arch: Standard branching. Mild aortic arch atherosclerotic calcifications. Imaged portion shows no evidence of aneurysm or dissection. No significant stenosis of the major arch vessel origins. Right carotid system: No evidence of dissection, stenosis (50% or greater) or occlusion. Proximal CCA atherosclerotic calcifications with 20% luminal narrowing. Carotid bifurcation atherosclerotic calcifications. Left carotid system: No evidence of dissection, stenosis (50% or greater) or occlusion. Circumferential proximal CCA atherosclerotic calcifications with 30% luminal narrowing. Carotid bifurcation atherosclerotic calcifications with approximately 40% narrowing of the proximal ICA. Vertebral arteries: Codominant. No evidence of dissection, stenosis (50% or greater) or occlusion. Skeleton: No acute or suspicious osseous abnormalities. Other neck: No adenopathy.  No soft tissue mass. Upper chest: Dependent atelectasis. Prominent to mildly enlarged superior mediastinal nodes. Review of the MIP images confirms the above findings CTA HEAD FINDINGS Anterior circulation: Bilateral carotid siphon atherosclerotic calcifications without significant narrowing of the intracavernous segments. No significant stenosis, proximal occlusion, aneurysm, or vascular malformation. Posterior circulation: Codominant vertebral arteries. Fetal origin of the bilateral PCAs. No significant stenosis, proximal occlusion, aneurysm, or vascular malformation. Venous sinuses: As permitted by contrast timing, patent. Anatomic variants: Detailed above. Review of the MIP images confirms the above findings IMPRESSION: CT head: Right putamen/external capsule intraparenchymal hematoma measuring 3.3 cm, less conspicuous than prior exam.  Trace intraventricular hemorrhage involving the right lateral ventricle is unchanged. Left insular subarachnoid hemorrhage, unchanged. CTA head  and neck: 40% proximal left ICA narrowing. 20-30% narrowing of the bilateral common carotids. No large vessel occlusion, high-grade narrowing, dissection or aneurysm involving the major intracranial vessels. Electronically Signed   By: Primitivo Gauze M.D.   On: 10/04/2019 17:39   Chest Port 1 View  Result Date: 10/03/2019 CLINICAL DATA:  Code stroke, cough. EXAM: PORTABLE CHEST 1 VIEW COMPARISON:  08/24/2019 FINDINGS: Mild enlargement the cardiac silhouette. No consolidation. Left basilar atelectasis. The visualized skeletal structures are unremarkable. Similar positioning of a left-sided venous catheter with the distal tip projecting at the right atrium. IMPRESSION: 1. No acute cardiopulmonary disease. 2. Mild left basilar atelectasis. 3. Mild cardiomegaly. 4. Similar position of the left-sided central venous catheter. Electronically Signed   By: Margaretha Sheffield MD   On: 10/03/2019 09:08   ECHOCARDIOGRAM COMPLETE  Result Date: 10/04/2019    ECHOCARDIOGRAM REPORT   Patient Name:   BABY [facility]AIRS Date of Exam: 10/04/2019 Medical Rec #:  [id number]      Height:       71.0 in Accession #:    [id number]     Weight:       158.1 lb Date of Birth:  04-08-1970      BSA:          1.908 m Patient Age:    49 years       BP:           120/58 mmHg Patient Gender: M              HR:           67 bpm. Exam Location:  Inpatient Procedure: 2D Echo, Color Doppler and Cardiac Doppler Indications:    Stroke i163.9  History:        Patient has no prior history of Echocardiogram examinations.                 Risk Factors:Hypertension and Diabetes.  Sonographer:    Raquel Sarna Senior RDCS Referring Phys: [place]  1. Left ventricular ejection fraction, by estimation, is 60 to 65%. The left ventricle has normal function. The left ventricle has no regional wall motion abnormalities. There is moderate concentric left ventricular hypertrophy. Left ventricular diastolic parameters are consistent with Grade I  diastolic dysfunction (impaired relaxation).  2. Right ventricular systolic function is normal. The right ventricular size is normal. There is normal pulmonary artery systolic pressure.  3. The mitral valve is normal in structure. Trivial mitral valve regurgitation. No evidence of mitral stenosis.  4. The aortic valve is normal in structure. Aortic valve regurgitation is not visualized. No aortic stenosis is present.  5. The inferior vena cava is normal in size with greater than 50% respiratory variability, suggesting right atrial pressure of 3 mmHg. FINDINGS  Left Ventricle: Left ventricular ejection fraction, by estimation, is 60 to 65%. The left ventricle has normal function. The left ventricle has no regional wall motion abnormalities. The left ventricular internal cavity size was normal in size. There is  moderate concentric left ventricular hypertrophy. Left ventricular diastolic parameters are consistent with Grade I diastolic dysfunction (impaired relaxation). Normal left ventricular filling pressure. Right Ventricle: The right ventricular size is normal. No increase in right ventricular wall thickness. Right ventricular systolic function is normal. There is normal pulmonary artery systolic pressure. The tricuspid regurgitant velocity is 2.28 m/s, and  with an assumed  right atrial pressure of 3 mmHg, the estimated right ventricular systolic pressure is 15.4 mmHg. Left Atrium: Left atrial size was normal in size. Right Atrium: Right atrial size was normal in size. Pericardium: There is no evidence of pericardial effusion. Mitral Valve: The mitral valve is normal in structure. Trivial mitral valve regurgitation. No evidence of mitral valve stenosis. Tricuspid Valve: The tricuspid valve is normal in structure. Tricuspid valve regurgitation is not demonstrated. No evidence of tricuspid stenosis. Aortic Valve: The aortic valve is normal in structure. Aortic valve regurgitation is not visualized. No aortic  stenosis is present. Pulmonic Valve: The pulmonic valve was normal in structure. Pulmonic valve regurgitation is not visualized. No evidence of pulmonic stenosis. Aorta: The aortic root is normal in size and structure. Venous: The inferior vena cava is normal in size with greater than 50% respiratory variability, suggesting right atrial pressure of 3 mmHg. IAS/Shunts: No atrial level shunt detected by color flow Doppler.  LEFT VENTRICLE PLAX 2D LVIDd:         4.80 cm  Diastology LVIDs:         3.10 cm  LV e' medial:    5.98 cm/s LV PW:         1.40 cm  LV E/e' medial:  8.6 LV IVS:        1.40 cm  LV e' lateral:   11.40 cm/s LVOT diam:     2.30 cm  LV E/e' lateral: 4.5 LV SV:         68 LV SV Index:   36 LVOT Area:     4.15 cm  RIGHT VENTRICLE RV S prime:     11.10 cm/s TAPSE (M-mode): 1.9 cm LEFT ATRIUM             Index       RIGHT ATRIUM           Index LA diam:        4.00 cm 2.10 cm/m  RA Area:     15.40 cm LA Vol (A2C):   57.0 ml 29.88 ml/m RA Volume:   36.20 ml  18.97 ml/m LA Vol (A4C):   51.6 ml 27.05 ml/m LA Biplane Vol: 59.3 ml 31.08 ml/m  AORTIC VALVE LVOT Vmax:   75.10 cm/s LVOT Vmean:  48.400 cm/s LVOT VTI:    0.164 m  AORTA Ao Root diam: 3.50 cm MITRAL VALVE               TRICUSPID VALVE MV Area (PHT): 2.34 cm    TR Peak grad:   20.8 mmHg MV Decel Time: 324 msec    TR Vmax:        228.00 cm/s MV E velocity: 51.40 cm/s MV A velocity: 60.80 cm/s  SHUNTS MV E/A ratio:  0.85        Systemic VTI:  0.16 m                            Systemic Diam: 2.30 cm Dani Gobble Croitoru MD Electronically signed by Sanda Klein MD Signature Date/Time: 10/04/2019/12:20:40 PM    Final    CT HEAD CODE STROKE WO CONTRAST  Result Date: 10/03/2019 CLINICAL DATA:  Code stroke. Headache and left-sided weakness with fixed gaze. Nausea and vomiting. EXAM: CT HEAD WITHOUT CONTRAST TECHNIQUE: Contiguous axial images were obtained from the base of the skull through the vertex without intravenous contrast. COMPARISON:  None.  FINDINGS: Brain: 35 x 31 x  30 mm acute hematoma at the right posterior putamen and external capsule. No intraventricular extension but there is a small volume of subarachnoid blood locally. There is a small rim of adjacent edema without gross underlying cortical infarct or mass. No hydrocephalus. Small chronic insult at the inferior left frontal cortex which could be ischemic or traumatic. Vascular: No hyperdense vessel or unexpected calcification. Skull: Normal. Negative for fracture or focal lesion. Sinuses/Orbits: No acute finding. Other: Critical Value/emergent results were called by telephone at the time of interpretation on 10/03/2019 at 8:20 am to provider Rory Percy, who verbally acknowledged these results. ASPECTS [facility] Stroke Program Early CT Score) Not scored in this setting IMPRESSION: 1. 15 cc acute hematoma at the right putamen and adjacent white matter. Small volume subarachnoid extension seen locally. 2. Small remote left frontal cortex insult, usually infarct. Small remote left cerebellar infarct. Electronically Signed   By: Monte Fantasia M.D.   On: 10/03/2019 08:21     Subjective: No acute issues or events overnight tolerating dialysis well, otherwise stable and agreeable for discharge to SNF.  Denies nausea, vomiting, diarrhea, constipation, headache, fevers, chills.   Discharge Exam: Vitals:   10/16/19 1105 10/16/19 1201  BP: 136/64 110/63  Pulse:  70  Resp: 17 16  Temp: (!) 97.4 F (36.3 C)   SpO2: 98% 100%   Vitals:   10/16/19 1030 10/16/19 1100 10/16/19 1105 10/16/19 1201  BP: (!) 111/47 (!) 110/48 136/64 110/63  Pulse:    70  Resp: 16 18 17 16   Temp:   (!) 97.4 F (36.3 C)   TempSrc:   Oral Oral  SpO2:   98% 100%  Weight:   63.2 kg   Height:        General: Pleasantly resting in bed, No acute distress. HEENT: Normocephalic atraumatic. Sclerae nonicteric, noninjected. Extraocular movements intact bilaterally. Neck: Without mass or deformity. Trachea is  midline. Lungs: Clear to auscultate bilaterally without rhonchi, wheeze, or rales. Heart: Regular rate and rhythm. Without murmurs, rubs, or gallops. Abdomen: Soft, nontender, nondistended. Without guarding or rebound. Extremities: Without cyanosis, clubbing, edema, bilateral BKA Vascular: Dorsalis pedis and posterior tibial pulses palpable bilaterally. Skin: Warm and dry, no erythema, no ulcerations.    The results of significant diagnostics from this hospitalization (including imaging, microbiology, ancillary and laboratory) are listed below for reference.     Microbiology: Recent Results (from the past 240 hour(s))  SARS Coronavirus 2 by RT PCR (hospital order, performed in [facility] hospital lab) Nasopharyngeal Nasopharyngeal Swab     Status: None   Collection Time: 10/08/19  2:33 PM   Specimen: Nasopharyngeal Swab  Result Value Ref Range Status   SARS Coronavirus 2 NEGATIVE NEGATIVE Final    Comment: (NOTE) SARS-CoV-2 target nucleic acids are NOT DETECTED.  The SARS-CoV-2 RNA is generally detectable in upper and lower respiratory specimens during the acute phase of infection. The lowest concentration of SARS-CoV-2 viral copies this assay can detect is 250 copies / mL. A negative result does not preclude SARS-CoV-2 infection and should not be used as the sole basis for treatment or other patient management decisions.  A negative result may occur with improper specimen collection / handling, submission of specimen other than nasopharyngeal swab, presence of viral mutation(s) within the areas targeted by this assay, and inadequate number of viral copies (<250 copies / mL). A negative result must be combined with clinical observations, patient history, and epidemiological information.  Fact Sheet for Patients:   StrictlyIdeas.no  Fact Sheet for  Healthcare Providers: BankingDealers.co.za  This test is not yet approved or   cleared by the [place] and has been authorized for detection and/or diagnosis of SARS-CoV-2 by FDA under an Emergency Use Authorization (EUA).  This EUA will remain in effect (meaning this test can be used) for the duration of the COVID-19 declaration under Section 564(b)(1) of the Act, 21 U.S.C. section 360bbb-3(b)(1), unless the authorization is terminated or revoked sooner.  Performed at [facility], [place] [address], [place], [place] [address]   Culture, blood (routine x 2)     Status: None   Collection Time: 10/10/19  1:14 AM   Specimen: BLOOD  Result Value Ref Range Status   Specimen Description BLOOD LEFT ARM  Final   Special Requests   Final    BOTTLES DRAWN AEROBIC AND ANAEROBIC Blood Culture adequate volume   Culture   Final    NO GROWTH 5 DAYS Performed at [facility], [place] [address], [place], [place] [address]    Report Status 10/15/2019 FINAL  Final  Culture, blood (routine x 2)     Status: None   Collection Time: 10/10/19  1:19 AM   Specimen: BLOOD  Result Value Ref Range Status   Specimen Description BLOOD LEFT HAND  Final   Special Requests   Final    BOTTLES DRAWN AEROBIC AND ANAEROBIC Blood Culture adequate volume   Culture   Final    NO GROWTH 5 DAYS Performed at [facility], [place] [address], [place], [place] [address]    Report Status 10/15/2019 FINAL  Final  SARS Coronavirus 2 by RT PCR (hospital order, performed in [facility] hospital lab) Nasopharyngeal Nasopharyngeal Swab     Status: None   Collection Time: 10/15/19  2:32 PM   Specimen: Nasopharyngeal Swab  Result Value Ref Range Status   SARS Coronavirus 2 NEGATIVE NEGATIVE Final    Comment: (NOTE) SARS-CoV-2 target nucleic acids are NOT DETECTED.  The SARS-CoV-2 RNA is generally detectable in upper and lower respiratory specimens during the acute phase of infection. The lowest concentration of SARS-CoV-2 viral copies this assay can detect is 250 copies / mL. A  negative result does not preclude SARS-CoV-2 infection and should not be used as the sole basis for treatment or other patient management decisions.  A negative result may occur with improper specimen collection / handling, submission of specimen other than nasopharyngeal swab, presence of viral mutation(s) within the areas targeted by this assay, and inadequate number of viral copies (<250 copies / mL). A negative result must be combined with clinical observations, patient history, and epidemiological information.  Fact Sheet for Patients:   StrictlyIdeas.no  Fact Sheet for Healthcare Providers: BankingDealers.co.za  This test is not yet approved or  cleared by the [place] FDA and has been authorized for detection and/or diagnosis of SARS-CoV-2 by FDA under an Emergency Use Authorization (EUA).  This EUA will remain in effect (meaning this test can be used) for the duration of the COVID-19 declaration under Section 564(b)(1) of the Act, 21 U.S.C. section 360bbb-3(b)(1), unless the authorization is terminated or revoked sooner.  Performed at [facility], [place] [address] [place], [place] [address]      Labs: BNP (last 3 results) No results for input(s): BNP in the last 8760 hours. Basic Metabolic Panel: Recent Labs  Lab 10/10/19 0730 10/13/19 0834 10/15/19 0822 10/16/19 0756  NA 136 133* 137 134*  K 5.1 6.6* 4.5 4.2  CL 98 98 99 98  CO2 26 23 25 25   GLUCOSE 179* 127* 199* 191*  BUN 52* 50* 39* 23*  CREATININE 8.05* 10.20* 8.66* 5.88*  CALCIUM 9.0 9.2 8.8* 9.0  PHOS 4.7* 3.8 5.1* 4.9*   Liver Function Tests: Recent Labs  Lab 10/10/19 0730 10/13/19 0834 10/15/19 0822 10/16/19 0756  ALBUMIN 2.7* 2.6* 2.6* 2.6*   No results for input(s): LIPASE, AMYLASE in the last 168 hours. No results for input(s): AMMONIA in the last 168 hours. CBC: Recent Labs  Lab 10/10/19 0729 10/13/19 0835 10/15/19 0822  10/16/19 0739  WBC 6.2 7.3 5.4 6.0  HGB 8.2* 8.1* 8.5* 9.1*  HCT 27.5* 26.6* 28.5* 30.3*  MCV 95.2 93.3 93.1 93.2  PLT 123* 160 165 157   Cardiac Enzymes: No results for input(s): CKTOTAL, CKMB, CKMBINDEX, TROPONINI in the last 168 hours. BNP: Invalid input(s): POCBNP CBG: Recent Labs  Lab 10/14/19 2136 10/15/19 0607 10/15/19 1200 10/15/19 1742 10/16/19 1208  GLUCAP 146* 177* 118* 188* 116*   D-Dimer No results for input(s): DDIMER in the last 72 hours. Hgb A1c No results for input(s): HGBA1C in the last 72 hours. Lipid Profile No results for input(s): CHOL, HDL, LDLCALC, TRIG, CHOLHDL, LDLDIRECT in the last 72 hours. Thyroid function studies No results for input(s): TSH, T4TOTAL, T3FREE, THYROIDAB in the last 72 hours.  Invalid input(s): FREET3 Anemia work up No results for input(s): VITAMINB12, FOLATE, FERRITIN, TIBC, IRON, RETICCTPCT in the last 72 hours. Urinalysis    Component Value Date/Time   COLORURINE YELLOW 08/22/2019 1154   APPEARANCEUR CLEAR 08/22/2019 1154   LABSPEC 1.010 08/22/2019 1154   PHURINE 5.0 08/22/2019 1154   GLUCOSEU >=500 (A) 08/22/2019 1154   HGBUR MODERATE (A) 08/22/2019 1154   BILIRUBINUR NEGATIVE 08/22/2019 1154   KETONESUR 5 (A) 08/22/2019 1154   PROTEINUR 100 (A) 08/22/2019 1154   NITRITE NEGATIVE 08/22/2019 1154   LEUKOCYTESUR SMALL (A) 08/22/2019 1154   Sepsis Labs Invalid input(s): PROCALCITONIN,  WBC,  LACTICIDVEN Microbiology Recent Results (from the past 240 hour(s))  SARS Coronavirus 2 by RT PCR (hospital order, performed in [place] hospital lab) Nasopharyngeal Nasopharyngeal Swab     Status: None   Collection Time: 10/08/19  2:33 PM   Specimen: Nasopharyngeal Swab  Result Value Ref Range Status   SARS Coronavirus 2 NEGATIVE NEGATIVE Final    Comment: (NOTE) SARS-CoV-2 target nucleic acids are NOT DETECTED.  The SARS-CoV-2 RNA is generally detectable in upper and lower respiratory specimens during the acute phase  of infection. The lowest concentration of SARS-CoV-2 viral copies this assay can detect is 250 copies / mL. A negative result does not preclude SARS-CoV-2 infection and should not be used as the sole basis for treatment or other patient management decisions.  A negative result may occur with improper specimen collection / handling, submission of specimen other than nasopharyngeal swab, presence of viral mutation(s) within the areas targeted by this assay, and inadequate number of viral copies (<250 copies / mL). A negative result must be combined with clinical observations, patient history, and epidemiological information.  Fact Sheet for Patients:   StrictlyIdeas.no  Fact Sheet for Healthcare Providers: BankingDealers.co.za  This test is not yet approved or  cleared by the [place] FDA and has been authorized for detection and/or diagnosis of SARS-CoV-2 by FDA under an Emergency Use Authorization (EUA).  This EUA will remain in effect (meaning this test can be used) for the duration of the COVID-19 declaration under Section 564(b)(1) of  the Act, 21 U.S.C. section 360bbb-3(b)(1), unless the authorization is terminated or revoked sooner.  Performed at [facility], [place] [address], [place], [place] [address]   Culture, blood (routine x 2)     Status: None   Collection Time: 10/10/19  1:14 AM   Specimen: BLOOD  Result Value Ref Range Status   Specimen Description BLOOD LEFT ARM  Final   Special Requests   Final    BOTTLES DRAWN AEROBIC AND ANAEROBIC Blood Culture adequate volume   Culture   Final    NO GROWTH 5 DAYS Performed at [facility], [place] [address] [place], [place] [address]    Report Status 10/15/2019 FINAL  Final  Culture, blood (routine x 2)     Status: None   Collection Time: 10/10/19  1:19 AM   Specimen: BLOOD  Result Value Ref Range Status   Specimen Description BLOOD LEFT HAND  Final   Special  Requests   Final    BOTTLES DRAWN AEROBIC AND ANAEROBIC Blood Culture adequate volume   Culture   Final    NO GROWTH 5 DAYS Performed at [facility], [place] [address], [place], [place] [address]    Report Status 10/15/2019 FINAL  Final  SARS Coronavirus 2 by RT PCR (hospital order, performed in [facility] hospital lab) Nasopharyngeal Nasopharyngeal Swab     Status: None   Collection Time: 10/15/19  2:32 PM   Specimen: Nasopharyngeal Swab  Result Value Ref Range Status   SARS Coronavirus 2 NEGATIVE NEGATIVE Final    Comment: (NOTE) SARS-CoV-2 target nucleic acids are NOT DETECTED.  The SARS-CoV-2 RNA is generally detectable in upper and lower respiratory specimens during the acute phase of infection. The lowest concentration of SARS-CoV-2 viral copies this assay can detect is 250 copies / mL. A negative result does not preclude SARS-CoV-2 infection and should not be used as the sole basis for treatment or other patient management decisions.  A negative result may occur with improper specimen collection / handling, submission of specimen other than nasopharyngeal swab, presence of viral mutation(s) within the areas targeted by this assay, and inadequate number of viral copies (<250 copies / mL). A negative result must be combined with clinical observations, patient history, and epidemiological information.  Fact Sheet for Patients:   StrictlyIdeas.no  Fact Sheet for Healthcare Providers: BankingDealers.co.za  This test is not yet approved or  cleared by the [place] FDA and has been authorized for detection and/or diagnosis of SARS-CoV-2 by FDA under an Emergency Use Authorization (EUA).  This EUA will remain in effect (meaning this test can be used) for the duration of the COVID-19 declaration under Section 564(b)(1) of the Act, 21 U.S.C. section 360bbb-3(b)(1), unless the authorization is terminated or revoked  sooner.  Performed at [facility], [place] [address] [place], [place] [address]      Time coordinating discharge: Over 30 minutes  SIGNED:   Little Ishikawa, DO [facility] 10/16/2019, 3:54 PM Pager   If 7PM-7AM, please contact night-coverage [email or web address]

## 2019-10-16 NOTE — TOC Transition Note (Signed)
Transition of Care [facility]) - CM/SW Discharge Note   Patient Details  Name: Jorge [name] MRN: [id number] Date of Birth: 1970-10-02  Transition [facility] CM/SW Contact:  Joanne Chars, LCSW Phone Number: 10/16/2019, 12:00 PM   Clinical Narrative:  Pt going to [place], Room 116.  RN call report to [phone]. PTAR called 1200.    Final next level of care: Skilled Nursing Facility Barriers to Discharge: Barriers Resolved   Patient Goals and CMS Choice Patient states their goals for this hospitalization and ongoing recovery are:: get back home CMS Medicare.gov Compare Post Acute Care list provided to:: Patient Choice offered to / list presented to : Patient  Discharge Placement              Patient chooses bed at:  ([facility]) Patient to be transferred to facility by: [place] Name of family member notified: Langley Gauss, sister Patient and family notified of of transfer: 10/16/19  Discharge Plan and Services     Post Acute Care Choice: [facility] [place]                               Social Determinants of Health (SDOH) Interventions     Readmission Risk Interventions No flowsheet data found.

## 2019-10-19 ENCOUNTER — Ambulatory Visit: Payer: Medicare Other | Admitting: Rehabilitation

## 2019-10-19 DIAGNOSIS — Z992 Dependence on renal dialysis: Secondary | ICD-10-CM | POA: Diagnosis not present

## 2019-10-19 DIAGNOSIS — D631 Anemia in chronic kidney disease: Secondary | ICD-10-CM | POA: Diagnosis not present

## 2019-10-19 DIAGNOSIS — K3184 Gastroparesis: Secondary | ICD-10-CM | POA: Diagnosis not present

## 2019-10-19 DIAGNOSIS — D509 Iron deficiency anemia, unspecified: Secondary | ICD-10-CM | POA: Diagnosis not present

## 2019-10-19 DIAGNOSIS — T8249XA Other complication of vascular dialysis catheter, initial encounter: Secondary | ICD-10-CM | POA: Diagnosis not present

## 2019-10-19 DIAGNOSIS — N186 End stage renal disease: Secondary | ICD-10-CM | POA: Diagnosis not present

## 2019-10-19 DIAGNOSIS — N2581 Secondary hyperparathyroidism of renal origin: Secondary | ICD-10-CM | POA: Diagnosis not present

## 2019-10-19 DIAGNOSIS — E1122 Type 2 diabetes mellitus with diabetic chronic kidney disease: Secondary | ICD-10-CM | POA: Diagnosis not present

## 2019-10-19 DIAGNOSIS — I10 Essential (primary) hypertension: Secondary | ICD-10-CM | POA: Diagnosis not present

## 2019-10-20 DIAGNOSIS — I619 Nontraumatic intracerebral hemorrhage, unspecified: Secondary | ICD-10-CM | POA: Diagnosis not present

## 2019-10-21 ENCOUNTER — Ambulatory Visit: Payer: Medicare Other | Admitting: Physical Therapy

## 2019-10-21 DIAGNOSIS — D631 Anemia in chronic kidney disease: Secondary | ICD-10-CM | POA: Diagnosis not present

## 2019-10-21 DIAGNOSIS — N186 End stage renal disease: Secondary | ICD-10-CM | POA: Diagnosis not present

## 2019-10-21 DIAGNOSIS — N2581 Secondary hyperparathyroidism of renal origin: Secondary | ICD-10-CM | POA: Diagnosis not present

## 2019-10-21 DIAGNOSIS — Z992 Dependence on renal dialysis: Secondary | ICD-10-CM | POA: Diagnosis not present

## 2019-10-21 DIAGNOSIS — T8249XA Other complication of vascular dialysis catheter, initial encounter: Secondary | ICD-10-CM | POA: Diagnosis not present

## 2019-10-21 DIAGNOSIS — D509 Iron deficiency anemia, unspecified: Secondary | ICD-10-CM | POA: Diagnosis not present

## 2019-10-23 DIAGNOSIS — T8249XA Other complication of vascular dialysis catheter, initial encounter: Secondary | ICD-10-CM | POA: Diagnosis not present

## 2019-10-23 DIAGNOSIS — N2581 Secondary hyperparathyroidism of renal origin: Secondary | ICD-10-CM | POA: Diagnosis not present

## 2019-10-23 DIAGNOSIS — Z992 Dependence on renal dialysis: Secondary | ICD-10-CM | POA: Diagnosis not present

## 2019-10-23 DIAGNOSIS — D631 Anemia in chronic kidney disease: Secondary | ICD-10-CM | POA: Diagnosis not present

## 2019-10-23 DIAGNOSIS — D509 Iron deficiency anemia, unspecified: Secondary | ICD-10-CM | POA: Diagnosis not present

## 2019-10-23 DIAGNOSIS — N186 End stage renal disease: Secondary | ICD-10-CM | POA: Diagnosis not present

## 2019-10-26 ENCOUNTER — Ambulatory Visit: Payer: Medicare Other | Admitting: Rehabilitation

## 2019-10-26 ENCOUNTER — Ambulatory Visit: Payer: Medicare Other | Admitting: Skilled Nursing Facility1

## 2019-10-26 DIAGNOSIS — Z992 Dependence on renal dialysis: Secondary | ICD-10-CM | POA: Diagnosis not present

## 2019-10-26 DIAGNOSIS — D631 Anemia in chronic kidney disease: Secondary | ICD-10-CM | POA: Diagnosis not present

## 2019-10-26 DIAGNOSIS — T8249XA Other complication of vascular dialysis catheter, initial encounter: Secondary | ICD-10-CM | POA: Diagnosis not present

## 2019-10-26 DIAGNOSIS — D509 Iron deficiency anemia, unspecified: Secondary | ICD-10-CM | POA: Diagnosis not present

## 2019-10-26 DIAGNOSIS — N2581 Secondary hyperparathyroidism of renal origin: Secondary | ICD-10-CM | POA: Diagnosis not present

## 2019-10-26 DIAGNOSIS — N186 End stage renal disease: Secondary | ICD-10-CM | POA: Diagnosis not present

## 2019-10-28 ENCOUNTER — Ambulatory Visit: Payer: Medicare Other | Admitting: Physical Therapy

## 2019-10-28 DIAGNOSIS — Z992 Dependence on renal dialysis: Secondary | ICD-10-CM | POA: Diagnosis not present

## 2019-10-28 DIAGNOSIS — N186 End stage renal disease: Secondary | ICD-10-CM | POA: Diagnosis not present

## 2019-10-28 DIAGNOSIS — N2581 Secondary hyperparathyroidism of renal origin: Secondary | ICD-10-CM | POA: Diagnosis not present

## 2019-10-28 DIAGNOSIS — D631 Anemia in chronic kidney disease: Secondary | ICD-10-CM | POA: Diagnosis not present

## 2019-10-28 DIAGNOSIS — D509 Iron deficiency anemia, unspecified: Secondary | ICD-10-CM | POA: Diagnosis not present

## 2019-10-28 DIAGNOSIS — T8249XA Other complication of vascular dialysis catheter, initial encounter: Secondary | ICD-10-CM | POA: Diagnosis not present

## 2019-10-30 ENCOUNTER — Telehealth: Payer: Self-pay | Admitting: *Deleted

## 2019-10-30 DIAGNOSIS — T8249XA Other complication of vascular dialysis catheter, initial encounter: Secondary | ICD-10-CM | POA: Diagnosis not present

## 2019-10-30 DIAGNOSIS — Z992 Dependence on renal dialysis: Secondary | ICD-10-CM | POA: Diagnosis not present

## 2019-10-30 DIAGNOSIS — D631 Anemia in chronic kidney disease: Secondary | ICD-10-CM | POA: Diagnosis not present

## 2019-10-30 DIAGNOSIS — N186 End stage renal disease: Secondary | ICD-10-CM | POA: Diagnosis not present

## 2019-10-30 DIAGNOSIS — N2581 Secondary hyperparathyroidism of renal origin: Secondary | ICD-10-CM | POA: Diagnosis not present

## 2019-10-30 DIAGNOSIS — D509 Iron deficiency anemia, unspecified: Secondary | ICD-10-CM | POA: Diagnosis not present

## 2019-10-30 MED ORDER — INSULIN LISPRO (1 UNIT DIAL) 100 UNIT/ML (KWIKPEN): 5.0000 [IU] | PEN_INJECTOR | Freq: Three times a day (TID) | SUBCUTANEOUS | 1 refills | Status: DC

## 2019-10-30 NOTE — Telephone Encounter (Signed)
Discontinue Novolog and start on Humalog Qwikpen 5 units three times daily With 3 refills

## 2019-10-30 NOTE — Telephone Encounter (Signed)
Merrilee Seashore with [facility] called and stated that patient's Insurance will no longer cover patient's Novolog Pen.  Stated that it needs to be changed to Humalog Qwikpen 5 units three times daily with meals instead.  Needs new Rx sent. Please Advise.  (sent to [place] due to Janett Billow out of office)

## 2019-10-30 NOTE — Telephone Encounter (Signed)
Medication list updated and Rx sent to Pharmacy.

## 2019-11-02 ENCOUNTER — Ambulatory Visit: Payer: Medicare Other | Admitting: Rehabilitation

## 2019-11-02 ENCOUNTER — Ambulatory Visit (INDEPENDENT_AMBULATORY_CARE_PROVIDER_SITE_OTHER): Payer: Medicare Other | Admitting: Nurse Practitioner

## 2019-11-02 ENCOUNTER — Encounter: Payer: Self-pay | Admitting: Nurse Practitioner

## 2019-11-02 ENCOUNTER — Other Ambulatory Visit: Payer: Self-pay

## 2019-11-02 VITALS — BP 138/80 | HR 69 | Temp 97.7°F | Resp 16 | Ht 71.0 in | Wt 150.0 lb

## 2019-11-02 DIAGNOSIS — F419 Anxiety disorder, unspecified: Secondary | ICD-10-CM | POA: Diagnosis not present

## 2019-11-02 DIAGNOSIS — F5104 Psychophysiologic insomnia: Secondary | ICD-10-CM

## 2019-11-02 DIAGNOSIS — E119 Type 2 diabetes mellitus without complications: Secondary | ICD-10-CM | POA: Diagnosis not present

## 2019-11-02 DIAGNOSIS — Z9109 Other allergy status, other than to drugs and biological substances: Secondary | ICD-10-CM

## 2019-11-02 DIAGNOSIS — N2581 Secondary hyperparathyroidism of renal origin: Secondary | ICD-10-CM | POA: Diagnosis not present

## 2019-11-02 DIAGNOSIS — T8249XA Other complication of vascular dialysis catheter, initial encounter: Secondary | ICD-10-CM | POA: Diagnosis not present

## 2019-11-02 DIAGNOSIS — I150 Renovascular hypertension: Secondary | ICD-10-CM

## 2019-11-02 DIAGNOSIS — N186 End stage renal disease: Secondary | ICD-10-CM | POA: Diagnosis not present

## 2019-11-02 DIAGNOSIS — D631 Anemia in chronic kidney disease: Secondary | ICD-10-CM | POA: Diagnosis not present

## 2019-11-02 DIAGNOSIS — Z794 Long term (current) use of insulin: Secondary | ICD-10-CM

## 2019-11-02 DIAGNOSIS — Z992 Dependence on renal dialysis: Secondary | ICD-10-CM | POA: Diagnosis not present

## 2019-11-02 DIAGNOSIS — R0602 Shortness of breath: Secondary | ICD-10-CM

## 2019-11-02 DIAGNOSIS — D509 Iron deficiency anemia, unspecified: Secondary | ICD-10-CM | POA: Diagnosis not present

## 2019-11-02 LAB — GLUCOSE, POCT (MANUAL RESULT ENTRY): POC Glucose: 213 mg/dl — AB (ref 70–99)

## 2019-11-02 MED ORDER — TRAZODONE HCL 50 MG PO TABS: 50.0000 mg | ORAL_TABLET | Freq: Every day | ORAL | 3 refills | Status: DC
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
## 2019-11-02 NOTE — Patient Instructions (Addendum)
Recommend psych consult at this time  To start trazodone 50 mg by mouth at bedtime to help with sleep.   To start zyrtec 10 mg daily for sinus congestion.

## 2019-11-04 ENCOUNTER — Ambulatory Visit: Payer: Medicare Other | Admitting: Physical Therapy

## 2019-11-04 DIAGNOSIS — T8249XA Other complication of vascular dialysis catheter, initial encounter: Secondary | ICD-10-CM | POA: Diagnosis not present

## 2019-11-04 DIAGNOSIS — Z992 Dependence on renal dialysis: Secondary | ICD-10-CM | POA: Diagnosis not present

## 2019-11-04 DIAGNOSIS — N186 End stage renal disease: Secondary | ICD-10-CM | POA: Diagnosis not present

## 2019-11-04 DIAGNOSIS — D509 Iron deficiency anemia, unspecified: Secondary | ICD-10-CM | POA: Diagnosis not present

## 2019-11-04 DIAGNOSIS — D631 Anemia in chronic kidney disease: Secondary | ICD-10-CM | POA: Diagnosis not present

## 2019-11-04 DIAGNOSIS — N2581 Secondary hyperparathyroidism of renal origin: Secondary | ICD-10-CM | POA: Diagnosis not present

## 2019-11-06 ENCOUNTER — Ambulatory Visit: Payer: Medicare Other | Admitting: Nurse Practitioner

## 2019-11-06 DIAGNOSIS — N2581 Secondary hyperparathyroidism of renal origin: Secondary | ICD-10-CM | POA: Diagnosis not present

## 2019-11-06 DIAGNOSIS — N186 End stage renal disease: Secondary | ICD-10-CM | POA: Diagnosis not present

## 2019-11-06 DIAGNOSIS — Z992 Dependence on renal dialysis: Secondary | ICD-10-CM | POA: Diagnosis not present

## 2019-11-06 DIAGNOSIS — T8249XA Other complication of vascular dialysis catheter, initial encounter: Secondary | ICD-10-CM | POA: Diagnosis not present

## 2019-11-06 DIAGNOSIS — D631 Anemia in chronic kidney disease: Secondary | ICD-10-CM | POA: Diagnosis not present

## 2019-11-06 DIAGNOSIS — D509 Iron deficiency anemia, unspecified: Secondary | ICD-10-CM | POA: Diagnosis not present

## 2019-11-08 DIAGNOSIS — E1122 Type 2 diabetes mellitus with diabetic chronic kidney disease: Secondary | ICD-10-CM | POA: Diagnosis not present

## 2019-11-08 DIAGNOSIS — N186 End stage renal disease: Secondary | ICD-10-CM | POA: Diagnosis not present

## 2019-11-08 DIAGNOSIS — Z992 Dependence on renal dialysis: Secondary | ICD-10-CM | POA: Diagnosis not present

## 2019-11-09 ENCOUNTER — Ambulatory Visit: Payer: Medicare Other | Admitting: Rehabilitation

## 2019-11-09 DIAGNOSIS — N2581 Secondary hyperparathyroidism of renal origin: Secondary | ICD-10-CM | POA: Diagnosis not present

## 2019-11-09 DIAGNOSIS — T8249XA Other complication of vascular dialysis catheter, initial encounter: Secondary | ICD-10-CM | POA: Diagnosis not present

## 2019-11-09 DIAGNOSIS — D631 Anemia in chronic kidney disease: Secondary | ICD-10-CM | POA: Diagnosis not present

## 2019-11-09 DIAGNOSIS — Z992 Dependence on renal dialysis: Secondary | ICD-10-CM | POA: Diagnosis not present

## 2019-11-09 DIAGNOSIS — N186 End stage renal disease: Secondary | ICD-10-CM | POA: Diagnosis not present

## 2019-11-09 DIAGNOSIS — Z23 Encounter for immunization: Secondary | ICD-10-CM | POA: Diagnosis not present

## 2019-11-09 DIAGNOSIS — E1122 Type 2 diabetes mellitus with diabetic chronic kidney disease: Secondary | ICD-10-CM | POA: Diagnosis not present

## 2019-11-11 ENCOUNTER — Ambulatory Visit: Payer: Medicare Other | Admitting: Physical Therapy

## 2019-11-11 DIAGNOSIS — E1122 Type 2 diabetes mellitus with diabetic chronic kidney disease: Secondary | ICD-10-CM | POA: Diagnosis not present

## 2019-11-11 DIAGNOSIS — Z992 Dependence on renal dialysis: Secondary | ICD-10-CM | POA: Diagnosis not present

## 2019-11-11 DIAGNOSIS — N186 End stage renal disease: Secondary | ICD-10-CM | POA: Diagnosis not present

## 2019-11-11 DIAGNOSIS — Z23 Encounter for immunization: Secondary | ICD-10-CM | POA: Diagnosis not present

## 2019-11-11 DIAGNOSIS — T8249XA Other complication of vascular dialysis catheter, initial encounter: Secondary | ICD-10-CM | POA: Diagnosis not present

## 2019-11-11 DIAGNOSIS — N2581 Secondary hyperparathyroidism of renal origin: Secondary | ICD-10-CM | POA: Diagnosis not present

## 2019-11-11 DIAGNOSIS — D631 Anemia in chronic kidney disease: Secondary | ICD-10-CM | POA: Diagnosis not present

## 2019-11-13 DIAGNOSIS — Z23 Encounter for immunization: Secondary | ICD-10-CM | POA: Diagnosis not present

## 2019-11-13 DIAGNOSIS — N2581 Secondary hyperparathyroidism of renal origin: Secondary | ICD-10-CM | POA: Diagnosis not present

## 2019-11-13 DIAGNOSIS — T8249XA Other complication of vascular dialysis catheter, initial encounter: Secondary | ICD-10-CM | POA: Diagnosis not present

## 2019-11-13 DIAGNOSIS — D631 Anemia in chronic kidney disease: Secondary | ICD-10-CM | POA: Diagnosis not present

## 2019-11-13 DIAGNOSIS — E1122 Type 2 diabetes mellitus with diabetic chronic kidney disease: Secondary | ICD-10-CM | POA: Diagnosis not present

## 2019-11-13 DIAGNOSIS — Z992 Dependence on renal dialysis: Secondary | ICD-10-CM | POA: Diagnosis not present

## 2019-11-13 DIAGNOSIS — N186 End stage renal disease: Secondary | ICD-10-CM | POA: Diagnosis not present

## 2019-11-14 DIAGNOSIS — Z89511 Acquired absence of right leg below knee: Secondary | ICD-10-CM | POA: Diagnosis not present

## 2019-11-14 DIAGNOSIS — Z89512 Acquired absence of left leg below knee: Secondary | ICD-10-CM | POA: Diagnosis not present

## 2019-11-14 DIAGNOSIS — I120 Hypertensive chronic kidney disease with stage 5 chronic kidney disease or end stage renal disease: Secondary | ICD-10-CM | POA: Diagnosis not present

## 2019-11-16 ENCOUNTER — Ambulatory Visit: Payer: Medicare Other | Admitting: Rehabilitation

## 2019-11-16 ENCOUNTER — Inpatient Hospital Stay: Payer: Medicare Other | Admitting: Adult Health

## 2019-11-16 DIAGNOSIS — Z992 Dependence on renal dialysis: Secondary | ICD-10-CM | POA: Diagnosis not present

## 2019-11-16 DIAGNOSIS — N2581 Secondary hyperparathyroidism of renal origin: Secondary | ICD-10-CM | POA: Diagnosis not present

## 2019-11-16 DIAGNOSIS — Z23 Encounter for immunization: Secondary | ICD-10-CM | POA: Diagnosis not present

## 2019-11-16 DIAGNOSIS — T8249XA Other complication of vascular dialysis catheter, initial encounter: Secondary | ICD-10-CM | POA: Diagnosis not present

## 2019-11-16 DIAGNOSIS — N186 End stage renal disease: Secondary | ICD-10-CM | POA: Diagnosis not present

## 2019-11-16 DIAGNOSIS — D631 Anemia in chronic kidney disease: Secondary | ICD-10-CM | POA: Diagnosis not present

## 2019-11-16 DIAGNOSIS — E1122 Type 2 diabetes mellitus with diabetic chronic kidney disease: Secondary | ICD-10-CM | POA: Diagnosis not present

## 2019-11-18 ENCOUNTER — Ambulatory Visit: Payer: Medicare Other | Admitting: Physical Therapy

## 2019-11-18 DIAGNOSIS — Z992 Dependence on renal dialysis: Secondary | ICD-10-CM | POA: Diagnosis not present

## 2019-11-18 DIAGNOSIS — Z23 Encounter for immunization: Secondary | ICD-10-CM | POA: Diagnosis not present

## 2019-11-18 DIAGNOSIS — D631 Anemia in chronic kidney disease: Secondary | ICD-10-CM | POA: Diagnosis not present

## 2019-11-18 DIAGNOSIS — E1122 Type 2 diabetes mellitus with diabetic chronic kidney disease: Secondary | ICD-10-CM | POA: Diagnosis not present

## 2019-11-18 DIAGNOSIS — T8249XA Other complication of vascular dialysis catheter, initial encounter: Secondary | ICD-10-CM | POA: Diagnosis not present

## 2019-11-18 DIAGNOSIS — N2581 Secondary hyperparathyroidism of renal origin: Secondary | ICD-10-CM | POA: Diagnosis not present

## 2019-11-18 DIAGNOSIS — N186 End stage renal disease: Secondary | ICD-10-CM | POA: Diagnosis not present

## 2019-11-19 ENCOUNTER — Encounter: Payer: Self-pay | Admitting: Adult Health

## 2019-11-19 ENCOUNTER — Ambulatory Visit (INDEPENDENT_AMBULATORY_CARE_PROVIDER_SITE_OTHER): Payer: Medicare Other | Admitting: Adult Health

## 2019-11-19 VITALS — BP 125/87 | HR 64 | Ht 70.0 in | Wt 163.0 lb

## 2019-11-19 DIAGNOSIS — I10 Essential (primary) hypertension: Secondary | ICD-10-CM | POA: Diagnosis not present

## 2019-11-19 DIAGNOSIS — E11649 Type 2 diabetes mellitus with hypoglycemia without coma: Secondary | ICD-10-CM

## 2019-11-19 DIAGNOSIS — I619 Nontraumatic intracerebral hemorrhage, unspecified: Secondary | ICD-10-CM | POA: Diagnosis not present

## 2019-11-19 DIAGNOSIS — Z794 Long term (current) use of insulin: Secondary | ICD-10-CM | POA: Diagnosis not present

## 2019-11-19 DIAGNOSIS — E785 Hyperlipidemia, unspecified: Secondary | ICD-10-CM
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
## 2019-11-19 NOTE — Patient Instructions (Signed)
Post stroke anxiety -recent PCP visit recommended initiating trazodone 50 mg nightly but does not appear as though this has been started - will attempt to reach out to your PCP to further assist  Overall recovering well from a stroke standpoint and recommend continued participation with therapies for likely ongoing improvement  Continue aspirin 81 mg daily  and atorvastatin for secondary stroke prevention  Continue to follow up with PCP regarding cholesterol, blood pressure and diabetes management  Maintain strict control of hypertension with blood pressure goal below 130/90, diabetes with hemoglobin A1c goal below 7% and cholesterol with LDL cholesterol (bad cholesterol) goal below 70 mg/dL.    Please call office once discharged from facility to schedule follow-up visit      Thank you for coming to see [place] at [facility]. I hope we have been able to provide you high quality care today.  You may receive a patient satisfaction survey over the next few weeks. We would appreciate your feedback and comments so that we may continue to improve ourselves and the health of our patients.

## 2019-11-20 DIAGNOSIS — T8249XA Other complication of vascular dialysis catheter, initial encounter: Secondary | ICD-10-CM | POA: Diagnosis not present

## 2019-11-20 DIAGNOSIS — K3184 Gastroparesis: Secondary | ICD-10-CM | POA: Diagnosis not present

## 2019-11-20 DIAGNOSIS — D631 Anemia in chronic kidney disease: Secondary | ICD-10-CM | POA: Diagnosis not present

## 2019-11-20 DIAGNOSIS — I619 Nontraumatic intracerebral hemorrhage, unspecified: Secondary | ICD-10-CM | POA: Diagnosis not present

## 2019-11-20 DIAGNOSIS — N2581 Secondary hyperparathyroidism of renal origin: Secondary | ICD-10-CM | POA: Diagnosis not present

## 2019-11-20 DIAGNOSIS — Z23 Encounter for immunization: Secondary | ICD-10-CM | POA: Diagnosis not present

## 2019-11-20 DIAGNOSIS — N186 End stage renal disease: Secondary | ICD-10-CM | POA: Diagnosis not present

## 2019-11-20 DIAGNOSIS — E1122 Type 2 diabetes mellitus with diabetic chronic kidney disease: Secondary | ICD-10-CM | POA: Diagnosis not present

## 2019-11-20 DIAGNOSIS — Z992 Dependence on renal dialysis: Secondary | ICD-10-CM | POA: Diagnosis not present

## 2019-11-20 NOTE — Progress Notes (Signed)
I agree with the above plan 

## 2019-11-23 ENCOUNTER — Ambulatory Visit: Payer: Medicare Other | Admitting: Rehabilitation

## 2019-11-23 DIAGNOSIS — Z992 Dependence on renal dialysis: Secondary | ICD-10-CM | POA: Diagnosis not present

## 2019-11-23 DIAGNOSIS — D631 Anemia in chronic kidney disease: Secondary | ICD-10-CM | POA: Diagnosis not present

## 2019-11-23 DIAGNOSIS — N186 End stage renal disease: Secondary | ICD-10-CM | POA: Diagnosis not present

## 2019-11-23 DIAGNOSIS — T8249XA Other complication of vascular dialysis catheter, initial encounter: Secondary | ICD-10-CM | POA: Diagnosis not present

## 2019-11-23 DIAGNOSIS — E1122 Type 2 diabetes mellitus with diabetic chronic kidney disease: Secondary | ICD-10-CM | POA: Diagnosis not present

## 2019-11-23 DIAGNOSIS — Z23 Encounter for immunization: Secondary | ICD-10-CM | POA: Diagnosis not present

## 2019-11-23 DIAGNOSIS — N2581 Secondary hyperparathyroidism of renal origin: Secondary | ICD-10-CM | POA: Diagnosis not present

## 2019-11-25 ENCOUNTER — Ambulatory Visit: Payer: Medicare Other | Admitting: Physical Therapy

## 2019-11-25 DIAGNOSIS — Z992 Dependence on renal dialysis: Secondary | ICD-10-CM | POA: Diagnosis not present

## 2019-11-25 DIAGNOSIS — T8249XA Other complication of vascular dialysis catheter, initial encounter: Secondary | ICD-10-CM | POA: Diagnosis not present

## 2019-11-25 DIAGNOSIS — D631 Anemia in chronic kidney disease: Secondary | ICD-10-CM | POA: Diagnosis not present

## 2019-11-25 DIAGNOSIS — N2581 Secondary hyperparathyroidism of renal origin: Secondary | ICD-10-CM | POA: Diagnosis not present

## 2019-11-25 DIAGNOSIS — Z23 Encounter for immunization: Secondary | ICD-10-CM | POA: Diagnosis not present

## 2019-11-25 DIAGNOSIS — N186 End stage renal disease: Secondary | ICD-10-CM | POA: Diagnosis not present

## 2019-11-25 DIAGNOSIS — E1122 Type 2 diabetes mellitus with diabetic chronic kidney disease: Secondary | ICD-10-CM | POA: Diagnosis not present

## 2019-11-27 DIAGNOSIS — Z992 Dependence on renal dialysis: Secondary | ICD-10-CM | POA: Diagnosis not present

## 2019-11-27 DIAGNOSIS — T8249XA Other complication of vascular dialysis catheter, initial encounter: Secondary | ICD-10-CM | POA: Diagnosis not present

## 2019-11-27 DIAGNOSIS — N2581 Secondary hyperparathyroidism of renal origin: Secondary | ICD-10-CM | POA: Diagnosis not present

## 2019-11-27 DIAGNOSIS — Z23 Encounter for immunization: Secondary | ICD-10-CM | POA: Diagnosis not present

## 2019-11-27 DIAGNOSIS — E1122 Type 2 diabetes mellitus with diabetic chronic kidney disease: Secondary | ICD-10-CM | POA: Diagnosis not present

## 2019-11-27 DIAGNOSIS — N186 End stage renal disease: Secondary | ICD-10-CM | POA: Diagnosis not present

## 2019-11-27 DIAGNOSIS — D631 Anemia in chronic kidney disease: Secondary | ICD-10-CM | POA: Diagnosis not present

## 2019-11-29 DIAGNOSIS — Z23 Encounter for immunization: Secondary | ICD-10-CM | POA: Diagnosis not present

## 2019-11-29 DIAGNOSIS — D631 Anemia in chronic kidney disease: Secondary | ICD-10-CM | POA: Diagnosis not present

## 2019-11-29 DIAGNOSIS — T8249XA Other complication of vascular dialysis catheter, initial encounter: Secondary | ICD-10-CM | POA: Diagnosis not present

## 2019-11-29 DIAGNOSIS — N186 End stage renal disease: Secondary | ICD-10-CM | POA: Diagnosis not present

## 2019-11-29 DIAGNOSIS — E1122 Type 2 diabetes mellitus with diabetic chronic kidney disease: Secondary | ICD-10-CM | POA: Diagnosis not present

## 2019-11-29 DIAGNOSIS — N2581 Secondary hyperparathyroidism of renal origin: Secondary | ICD-10-CM | POA: Diagnosis not present

## 2019-11-29 DIAGNOSIS — Z992 Dependence on renal dialysis: Secondary | ICD-10-CM | POA: Diagnosis not present

## 2019-12-01 DIAGNOSIS — D631 Anemia in chronic kidney disease: Secondary | ICD-10-CM | POA: Diagnosis not present

## 2019-12-01 DIAGNOSIS — N186 End stage renal disease: Secondary | ICD-10-CM | POA: Diagnosis not present

## 2019-12-01 DIAGNOSIS — E1122 Type 2 diabetes mellitus with diabetic chronic kidney disease: Secondary | ICD-10-CM | POA: Diagnosis not present

## 2019-12-01 DIAGNOSIS — Z992 Dependence on renal dialysis: Secondary | ICD-10-CM | POA: Diagnosis not present

## 2019-12-01 DIAGNOSIS — T8249XA Other complication of vascular dialysis catheter, initial encounter: Secondary | ICD-10-CM | POA: Diagnosis not present

## 2019-12-01 DIAGNOSIS — Z23 Encounter for immunization: Secondary | ICD-10-CM | POA: Diagnosis not present

## 2019-12-01 DIAGNOSIS — N2581 Secondary hyperparathyroidism of renal origin: Secondary | ICD-10-CM | POA: Diagnosis not present

## 2019-12-02 ENCOUNTER — Ambulatory Visit: Payer: Medicare Other | Admitting: Physical Therapy

## 2019-12-04 DIAGNOSIS — E1122 Type 2 diabetes mellitus with diabetic chronic kidney disease: Secondary | ICD-10-CM | POA: Diagnosis not present

## 2019-12-04 DIAGNOSIS — Z23 Encounter for immunization: Secondary | ICD-10-CM | POA: Diagnosis not present

## 2019-12-04 DIAGNOSIS — T8249XA Other complication of vascular dialysis catheter, initial encounter: Secondary | ICD-10-CM | POA: Diagnosis not present

## 2019-12-04 DIAGNOSIS — N2581 Secondary hyperparathyroidism of renal origin: Secondary | ICD-10-CM | POA: Diagnosis not present

## 2019-12-04 DIAGNOSIS — N186 End stage renal disease: Secondary | ICD-10-CM | POA: Diagnosis not present

## 2019-12-04 DIAGNOSIS — D631 Anemia in chronic kidney disease: Secondary | ICD-10-CM | POA: Diagnosis not present

## 2019-12-04 DIAGNOSIS — Z992 Dependence on renal dialysis: Secondary | ICD-10-CM | POA: Diagnosis not present

## 2019-12-07 DIAGNOSIS — N186 End stage renal disease: Secondary | ICD-10-CM | POA: Diagnosis not present

## 2019-12-07 DIAGNOSIS — N2581 Secondary hyperparathyroidism of renal origin: Secondary | ICD-10-CM | POA: Diagnosis not present

## 2019-12-07 DIAGNOSIS — Z23 Encounter for immunization: Secondary | ICD-10-CM | POA: Diagnosis not present

## 2019-12-07 DIAGNOSIS — T8249XA Other complication of vascular dialysis catheter, initial encounter: Secondary | ICD-10-CM | POA: Diagnosis not present

## 2019-12-07 DIAGNOSIS — D631 Anemia in chronic kidney disease: Secondary | ICD-10-CM | POA: Diagnosis not present

## 2019-12-07 DIAGNOSIS — E1122 Type 2 diabetes mellitus with diabetic chronic kidney disease: Secondary | ICD-10-CM | POA: Diagnosis not present

## 2019-12-07 DIAGNOSIS — Z992 Dependence on renal dialysis: Secondary | ICD-10-CM | POA: Diagnosis not present

## 2019-12-08 DIAGNOSIS — E1122 Type 2 diabetes mellitus with diabetic chronic kidney disease: Secondary | ICD-10-CM | POA: Diagnosis not present

## 2019-12-08 DIAGNOSIS — N186 End stage renal disease: Secondary | ICD-10-CM | POA: Diagnosis not present

## 2019-12-08 DIAGNOSIS — Z992 Dependence on renal dialysis: Secondary | ICD-10-CM | POA: Diagnosis not present

## 2019-12-09 DIAGNOSIS — T8249XA Other complication of vascular dialysis catheter, initial encounter: Secondary | ICD-10-CM | POA: Diagnosis not present

## 2019-12-09 DIAGNOSIS — Z452 Encounter for adjustment and management of vascular access device: Secondary | ICD-10-CM | POA: Diagnosis not present

## 2019-12-09 DIAGNOSIS — Z992 Dependence on renal dialysis: Secondary | ICD-10-CM | POA: Diagnosis not present

## 2019-12-09 DIAGNOSIS — D509 Iron deficiency anemia, unspecified: Secondary | ICD-10-CM | POA: Diagnosis not present

## 2019-12-09 DIAGNOSIS — D631 Anemia in chronic kidney disease: Secondary | ICD-10-CM | POA: Diagnosis not present

## 2019-12-09 DIAGNOSIS — E1122 Type 2 diabetes mellitus with diabetic chronic kidney disease: Secondary | ICD-10-CM | POA: Diagnosis not present

## 2019-12-09 DIAGNOSIS — N2581 Secondary hyperparathyroidism of renal origin: Secondary | ICD-10-CM | POA: Diagnosis not present

## 2019-12-09 DIAGNOSIS — N186 End stage renal disease: Secondary | ICD-10-CM | POA: Diagnosis not present

## 2019-12-11 DIAGNOSIS — Z992 Dependence on renal dialysis: Secondary | ICD-10-CM | POA: Diagnosis not present

## 2019-12-11 DIAGNOSIS — N2581 Secondary hyperparathyroidism of renal origin: Secondary | ICD-10-CM | POA: Diagnosis not present

## 2019-12-11 DIAGNOSIS — N186 End stage renal disease: Secondary | ICD-10-CM | POA: Diagnosis not present

## 2019-12-11 DIAGNOSIS — T8249XA Other complication of vascular dialysis catheter, initial encounter: Secondary | ICD-10-CM | POA: Diagnosis not present

## 2019-12-11 DIAGNOSIS — E1122 Type 2 diabetes mellitus with diabetic chronic kidney disease: Secondary | ICD-10-CM | POA: Diagnosis not present

## 2019-12-11 DIAGNOSIS — D631 Anemia in chronic kidney disease: Secondary | ICD-10-CM | POA: Diagnosis not present

## 2019-12-11 DIAGNOSIS — D509 Iron deficiency anemia, unspecified: Secondary | ICD-10-CM | POA: Diagnosis not present

## 2019-12-14 DIAGNOSIS — N186 End stage renal disease: Secondary | ICD-10-CM | POA: Diagnosis not present

## 2019-12-14 DIAGNOSIS — E1122 Type 2 diabetes mellitus with diabetic chronic kidney disease: Secondary | ICD-10-CM | POA: Diagnosis not present

## 2019-12-14 DIAGNOSIS — T148XXA Other injury of unspecified body region, initial encounter: Secondary | ICD-10-CM | POA: Diagnosis not present

## 2019-12-14 DIAGNOSIS — N2581 Secondary hyperparathyroidism of renal origin: Secondary | ICD-10-CM | POA: Diagnosis not present

## 2019-12-14 DIAGNOSIS — I120 Hypertensive chronic kidney disease with stage 5 chronic kidney disease or end stage renal disease: Secondary | ICD-10-CM | POA: Diagnosis not present

## 2019-12-14 DIAGNOSIS — Z89512 Acquired absence of left leg below knee: Secondary | ICD-10-CM | POA: Diagnosis not present

## 2019-12-14 DIAGNOSIS — T8249XA Other complication of vascular dialysis catheter, initial encounter: Secondary | ICD-10-CM | POA: Diagnosis not present

## 2019-12-14 DIAGNOSIS — Z89511 Acquired absence of right leg below knee: Secondary | ICD-10-CM | POA: Diagnosis not present

## 2019-12-14 DIAGNOSIS — D509 Iron deficiency anemia, unspecified: Secondary | ICD-10-CM | POA: Diagnosis not present

## 2019-12-14 DIAGNOSIS — D631 Anemia in chronic kidney disease: Secondary | ICD-10-CM | POA: Diagnosis not present

## 2019-12-14 DIAGNOSIS — Z89519 Acquired absence of unspecified leg below knee: Secondary | ICD-10-CM | POA: Diagnosis not present

## 2019-12-14 DIAGNOSIS — Z992 Dependence on renal dialysis: Secondary | ICD-10-CM | POA: Diagnosis not present

## 2019-12-15 ENCOUNTER — Emergency Department (HOSPITAL_COMMUNITY): Payer: Medicare Other

## 2019-12-15 ENCOUNTER — Emergency Department (HOSPITAL_COMMUNITY)
Admission: EM | Admit: 2019-12-15 | Discharge: 2019-12-16 | Disposition: A | Discharge status: Home / Self Care | Payer: Medicare Other | Attending: Emergency Medicine | Admitting: Emergency Medicine

## 2019-12-15 DIAGNOSIS — Z743 Need for continuous supervision: Secondary | ICD-10-CM | POA: Diagnosis not present

## 2019-12-15 DIAGNOSIS — Z794 Long term (current) use of insulin: Secondary | ICD-10-CM | POA: Insufficient documentation

## 2019-12-15 DIAGNOSIS — T383X5A Adverse effect of insulin and oral hypoglycemic [antidiabetic] drugs, initial encounter: Secondary | ICD-10-CM

## 2019-12-15 DIAGNOSIS — R404 Transient alteration of awareness: Secondary | ICD-10-CM | POA: Diagnosis not present

## 2019-12-15 DIAGNOSIS — R4182 Altered mental status, unspecified: Secondary | ICD-10-CM | POA: Diagnosis present

## 2019-12-15 DIAGNOSIS — Z87891 Personal history of nicotine dependence: Secondary | ICD-10-CM | POA: Insufficient documentation

## 2019-12-15 DIAGNOSIS — I120 Hypertensive chronic kidney disease with stage 5 chronic kidney disease or end stage renal disease: Secondary | ICD-10-CM | POA: Diagnosis not present

## 2019-12-15 DIAGNOSIS — Z7982 Long term (current) use of aspirin: Secondary | ICD-10-CM | POA: Diagnosis not present

## 2019-12-15 DIAGNOSIS — N186 End stage renal disease: Secondary | ICD-10-CM | POA: Insufficient documentation

## 2019-12-15 DIAGNOSIS — E11649 Type 2 diabetes mellitus with hypoglycemia without coma: Secondary | ICD-10-CM | POA: Diagnosis not present

## 2019-12-15 DIAGNOSIS — I10 Essential (primary) hypertension: Secondary | ICD-10-CM | POA: Diagnosis not present

## 2019-12-15 DIAGNOSIS — R58 Hemorrhage, not elsewhere classified: Secondary | ICD-10-CM | POA: Diagnosis not present

## 2019-12-15 DIAGNOSIS — Z992 Dependence on renal dialysis: Secondary | ICD-10-CM | POA: Insufficient documentation

## 2019-12-15 DIAGNOSIS — R29818 Other symptoms and signs involving the nervous system: Secondary | ICD-10-CM | POA: Diagnosis not present

## 2019-12-15 DIAGNOSIS — R6889 Other general symptoms and signs: Secondary | ICD-10-CM | POA: Diagnosis not present

## 2019-12-15 DIAGNOSIS — E162 Hypoglycemia, unspecified: Secondary | ICD-10-CM

## 2019-12-15 DIAGNOSIS — E160 Drug-induced hypoglycemia without coma: Secondary | ICD-10-CM

## 2019-12-15 DIAGNOSIS — E161 Other hypoglycemia: Secondary | ICD-10-CM | POA: Diagnosis not present

## 2019-12-15 LAB — CBG MONITORING, ED
Glucose-Capillary: 148 mg/dL — ABNORMAL HIGH (ref 70–99)
Glucose-Capillary: 29 mg/dL — CL (ref 70–99)
Glucose-Capillary: 152 mg/dL — ABNORMAL HIGH (ref 70–99)
Glucose-Capillary: 178 mg/dL — ABNORMAL HIGH (ref 70–99)
Glucose-Capillary: 190 mg/dL — ABNORMAL HIGH (ref 70–99)
Glucose-Capillary: 209 mg/dL — ABNORMAL HIGH (ref 70–99)

## 2019-12-15 LAB — I-STAT CHEM 8, ED
BUN: 32 mg/dL — ABNORMAL HIGH (ref 6–20)
Calcium, Ion: 1.22 mmol/L (ref 1.15–1.40)
Chloride: 95 mmol/L — ABNORMAL LOW (ref 98–111)
Creatinine, Ser: 7.4 mg/dL — ABNORMAL HIGH (ref 0.61–1.24)
Glucose, Bld: 148 mg/dL — ABNORMAL HIGH (ref 70–99)
HCT: 30 % — ABNORMAL LOW (ref 39.0–52.0)
Hemoglobin: 10.2 g/dL — ABNORMAL LOW (ref 13.0–17.0)
Potassium: 3.7 mmol/L (ref 3.5–5.1)
Sodium: 136 mmol/L (ref 135–145)
TCO2: 29 mmol/L (ref 22–32)

## 2019-12-15 LAB — COMPREHENSIVE METABOLIC PANEL
ALT: 13 U/L (ref 0–44)
AST: 16 U/L (ref 15–41)
Albumin: 2.6 g/dL — ABNORMAL LOW (ref 3.5–5.0)
Alkaline Phosphatase: 73 U/L (ref 38–126)
Anion gap: 11 (ref 5–15)
BUN: 28 mg/dL — ABNORMAL HIGH (ref 6–20)
CO2: 29 mmol/L (ref 22–32)
Calcium: 9.2 mg/dL (ref 8.9–10.3)
Chloride: 94 mmol/L — ABNORMAL LOW (ref 98–111)
Creatinine, Ser: 7.32 mg/dL — ABNORMAL HIGH (ref 0.61–1.24)
GFR, Estimated: 8 mL/min — ABNORMAL LOW (ref >60)
Glucose, Bld: 151 mg/dL — ABNORMAL HIGH (ref 70–99)
Potassium: 3.6 mmol/L (ref 3.5–5.1)
Sodium: 134 mmol/L — ABNORMAL LOW (ref 135–145)
Total Bilirubin: 0.2 mg/dL — ABNORMAL LOW (ref 0.3–1.2)
Total Protein: 6.8 g/dL (ref 6.5–8.1)

## 2019-12-15 LAB — DIFFERENTIAL
Abs Immature Granulocytes: 0.06 10*3/uL (ref 0.00–0.07)
Basophils Absolute: 0.1 10*3/uL (ref 0.0–0.1)
Basophils Relative: 1 %
Eosinophils Absolute: 0.4 10*3/uL (ref 0.0–0.5)
Eosinophils Relative: 5 %
Immature Granulocytes: 1 %
Lymphocytes Relative: 20 %
Lymphs Abs: 1.3 10*3/uL (ref 0.7–4.0)
Monocytes Absolute: 0.7 10*3/uL (ref 0.1–1.0)
Monocytes Relative: 10 %
Neutro Abs: 4.2 10*3/uL (ref 1.7–7.7)
Neutrophils Relative %: 63 %

## 2019-12-15 LAB — CBC
HCT: 29 % — ABNORMAL LOW (ref 39.0–52.0)
Hemoglobin: 8.5 g/dL — ABNORMAL LOW (ref 13.0–17.0)
MCH: 26.2 pg (ref 26.0–34.0)
MCHC: 29.3 g/dL — ABNORMAL LOW (ref 30.0–36.0)
MCV: 89.5 fL (ref 80.0–100.0)
Platelets: 164 10*3/uL (ref 150–400)
RBC: 3.24 MIL/uL — ABNORMAL LOW (ref 4.22–5.81)
RDW: 16.9 % — ABNORMAL HIGH (ref 11.5–15.5)
WBC: 6.7 10*3/uL (ref 4.0–10.5)
nRBC: 0 % (ref 0.0–0.2)

## 2019-12-15 LAB — PROTIME-INR
INR: 1.1 (ref 0.8–1.2)
Prothrombin Time: 13.9 seconds (ref 11.4–15.2)

## 2019-12-15 LAB — ETHANOL: Alcohol, Ethyl (B): <10 mg/dL (ref <10)

## 2019-12-15 LAB — APTT: aPTT: 32 seconds (ref 24–36)

## 2019-12-15 IMAGING — CT CT HEAD CODE STROKE
3 series · 15 of 47 positions shown, 18 images · non-contrast
Comparison: CT head without contrast [DATE]

CLINICAL DATA: Code stroke. Altered mental status. Neuro deficit,
acute, stroke suspected. Patient began suddenly unresponsive.
Personal history of intracranial hemorrhage.

EXAM:
CT HEAD WITHOUT CONTRAST
TECHNIQUE: Contiguous axial images were obtained from the base of the skull
through the vertex without intravenous contrast.

[Series 3: head 5.0 st · axial · 0.44mm/px · z∈[+865,+1000]mm · 9 of 33 slices shown, 12 images]
[im 3/33  brain]
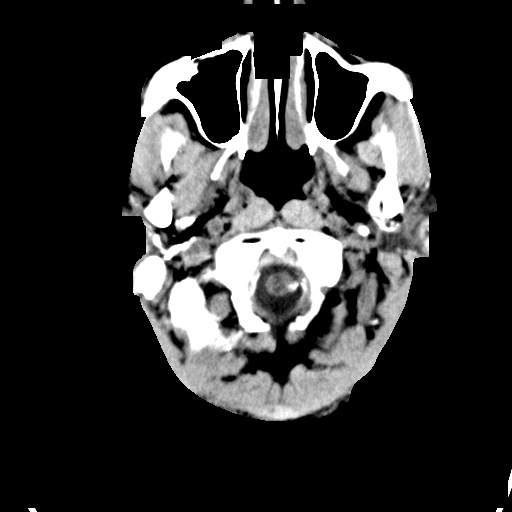
[im 3/33  bone]
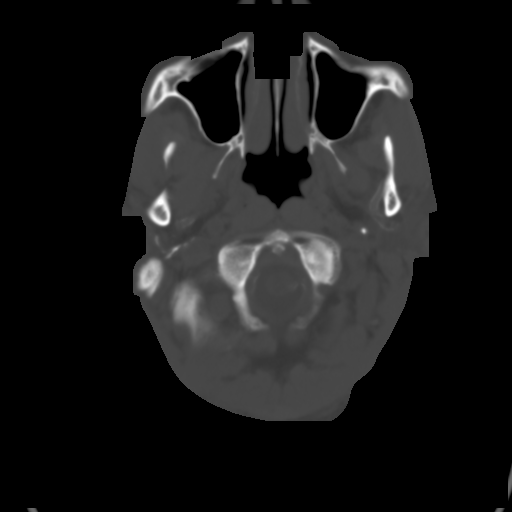
[im 6/33  brain]
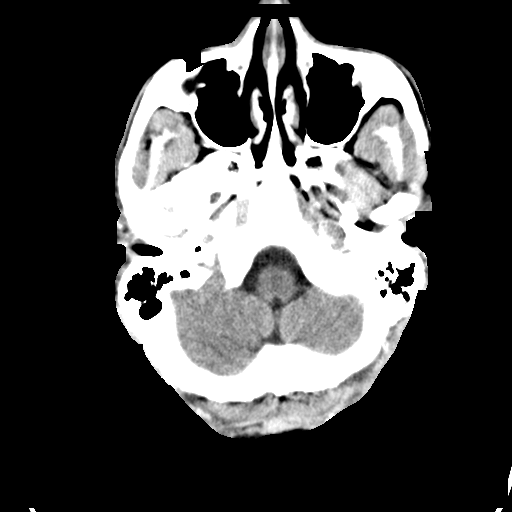
[im 9/33  brain]
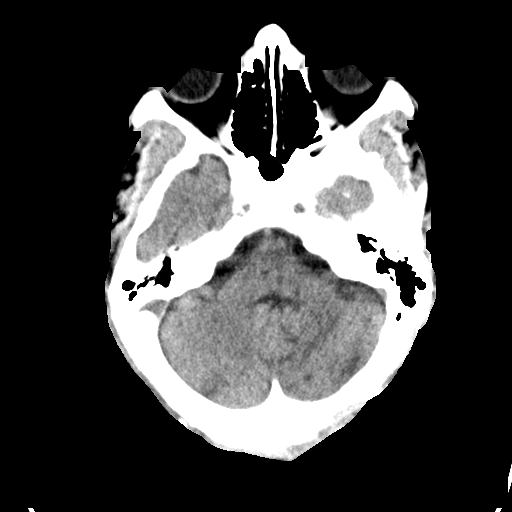
[im 13/33  brain]
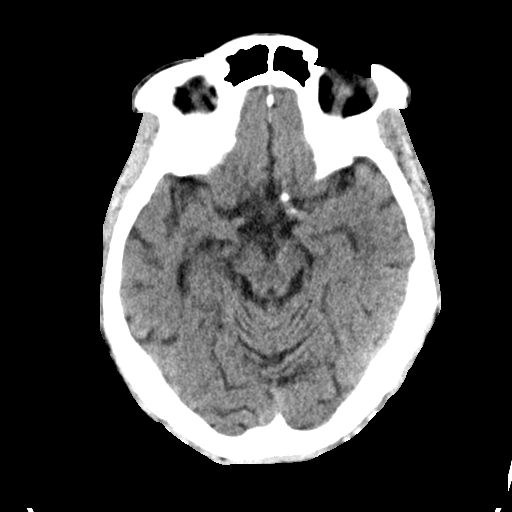
[im 17/33  brain]
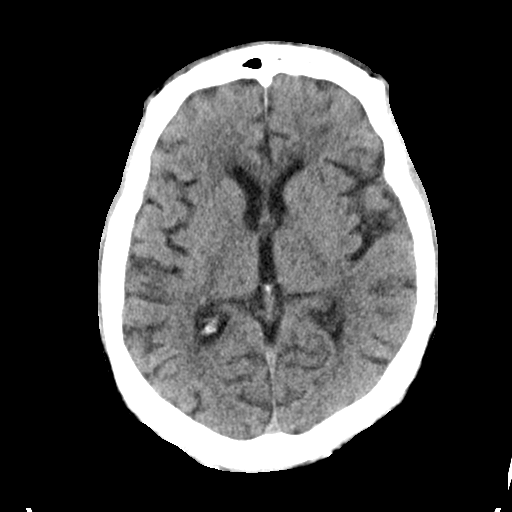
[im 17/33  bone]
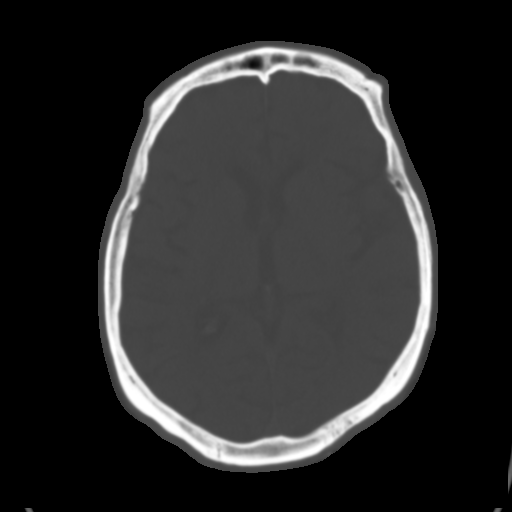
[im 20/33  brain]
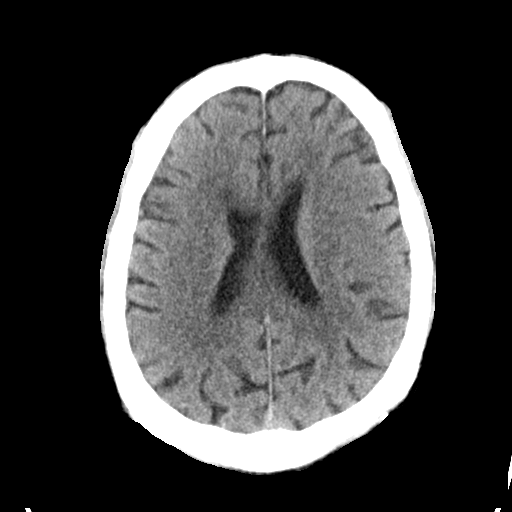
[im 24/33  brain]
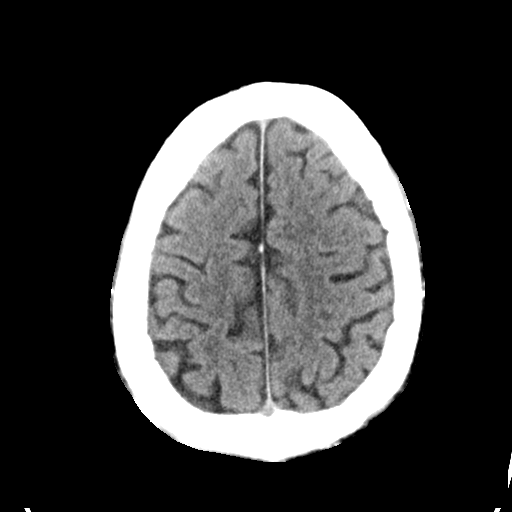
[im 27/33  brain]
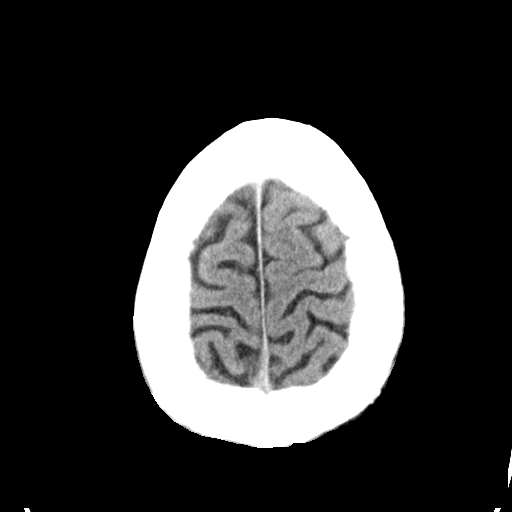
[im 30/33  brain]
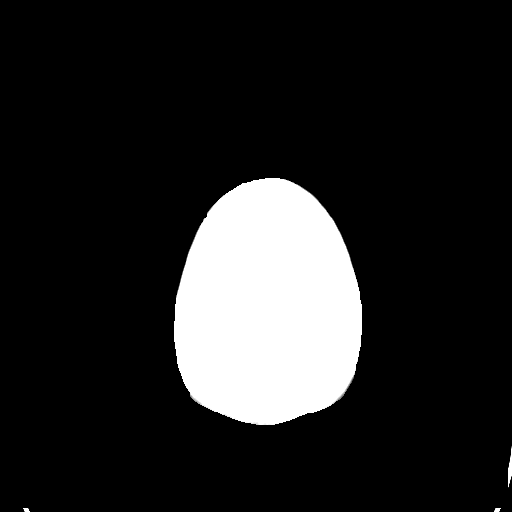
[im 30/33  bone]
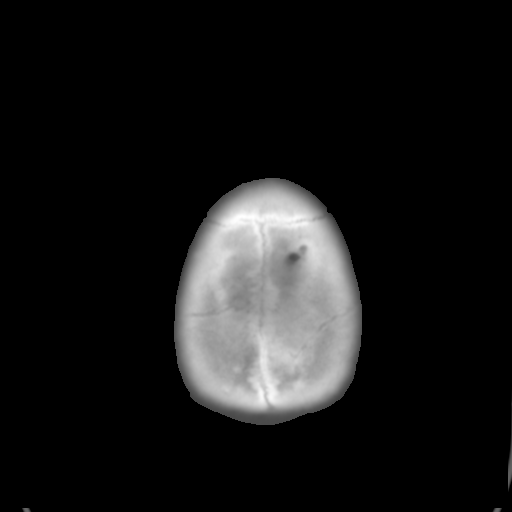

[Series 5: head 3.0 cor st · coronal · 0.31mm/px · 3 of 72 slices shown]
[im 24/72  brain]
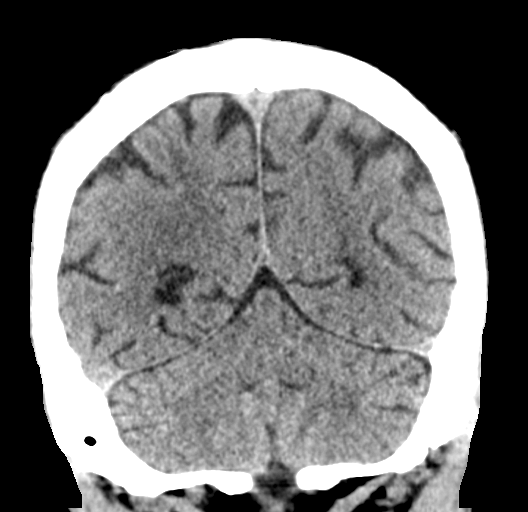
[im 32/72  brain]
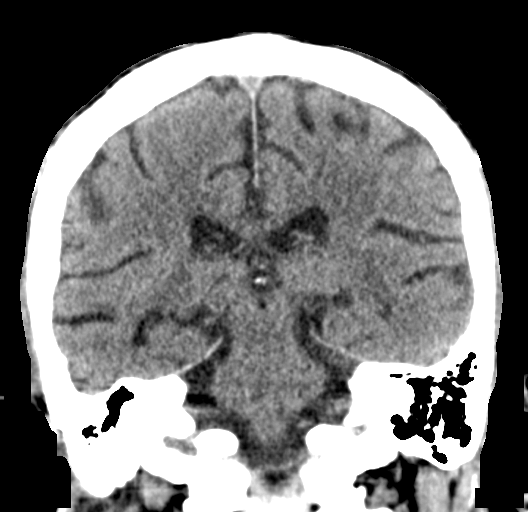
[im 40/72  brain]
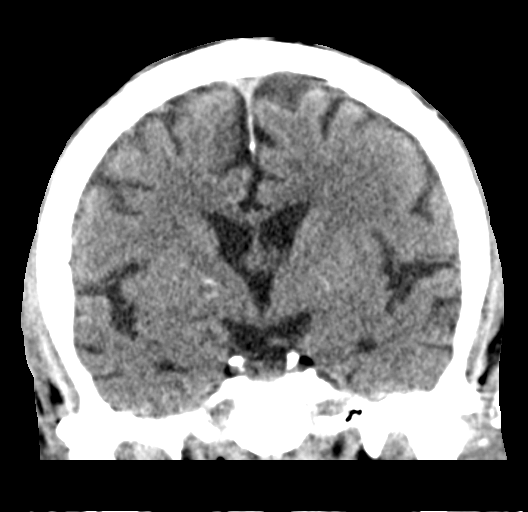

[Series 6: head 3.0 sag st · sagittal · 0.31mm/px · 3 of 53 slices shown]
[im 18/53  brain]
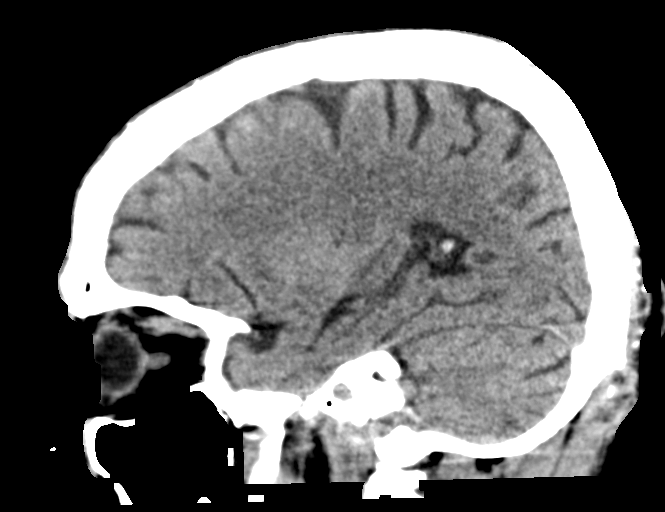
[im 27/53  brain]
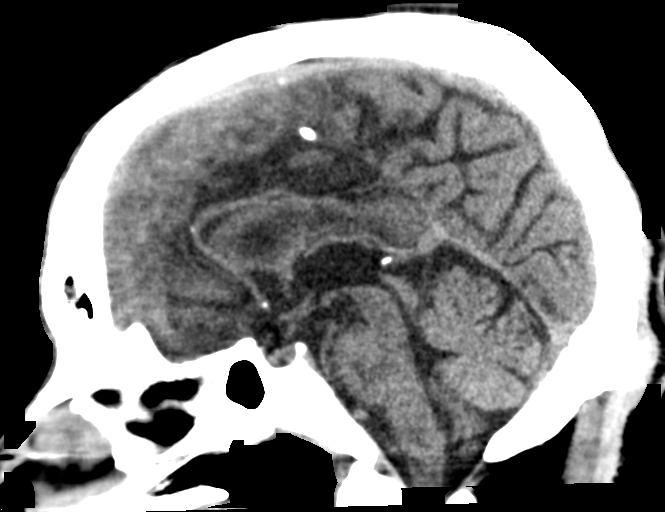
[im 35/53  brain]
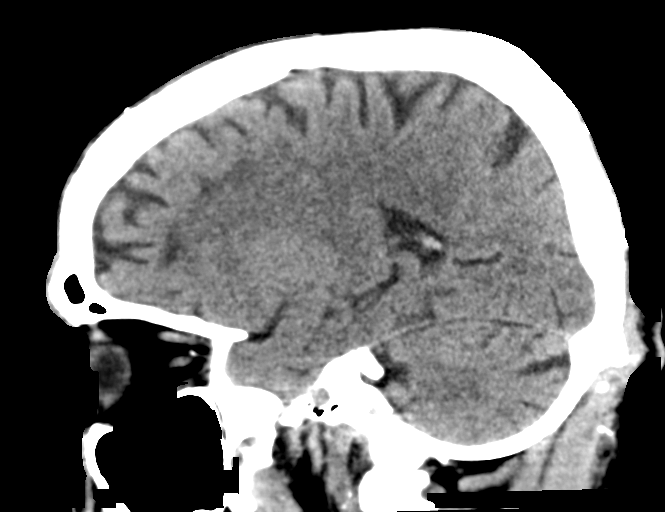

[15 of 47 positions shown; findings below may reference images not displayed]

FINDINGS: Brain: Previously noted hyperdense hemorrhage of the right basal
ganglia now only faintly visible. This may represent some residual
blood products. No new hemorrhage or ischemic infarct is present.
Basal ganglia are otherwise unremarkable. Insular ribbon is normal
bilaterally. Cortex is unremarkable. Moderate generalized white
matter hypoattenuation is advanced for age, but stable. Remote
lacunar infarcts are again noted in the thalami. Remote lacunar
infarcts are present in the cerebellum bilaterally. Brainstem is
unremarkable. No significant extraaxial fluid collection is present.

Vascular: Atherosclerotic calcifications are present within the
cavernous internal carotid arteries bilaterally as well as the dural
margin of both vertebral arteries. No hyperdense vessel is present.

Skull: Calvarium is intact. No focal lytic or blastic lesions are
present. No significant extracranial soft tissue lesion is present.

Sinuses/Orbits: The paranasal sinuses and mastoid air cells are
clear. The globes and orbits are within normal limits.

The up

ASPECTS (Alberta Stroke Program Early CT Score)

- Ganglionic level infarction (caudate, lentiform nuclei, internal
capsule, insula, M1-M3 cortex): [DATE]

- Supraganglionic infarction (M4-M6 cortex): [DATE]

Total score (0-10 with 10 being normal): [DATE]
IMPRESSION: 1. Previously noted hyperdense hemorrhage of the right basal ganglia
now only faintly visible. This may represent some residual blood
products.
2. No new hemorrhage or ischemic infarct.
3. Stable atrophy and white matter disease. This likely reflects the
sequela of chronic microvascular ischemia.
4. Remote lacunar infarcts of the thalami bilaterally and cerebellum
bilaterally.
5. Atherosclerosis.

The above was relayed via text pager to Dr. SUMALA on [DATE]

## 2019-12-15 MED ORDER — SODIUM CHLORIDE 0.9 % IV BOLUS
500.0000 mL | Freq: Once | INTRAVENOUS | Status: AC
  Administered 2019-12-15: 500 mL via INTRAVENOUS

## 2019-12-15 MED ORDER — SODIUM CHLORIDE 0.9 % IV SOLN
100.0000 mL/h | INTRAVENOUS | Status: DC
  Administered 2019-12-15: 100 mL/h via INTRAVENOUS

## 2019-12-15 MED ORDER — DEXTROSE 50 % IV SOLN: 1.0000 | Freq: Once | INTRAVENOUS | Status: AC

## 2019-12-15 MED ORDER — DEXTROSE 50 % IV SOLN
INTRAVENOUS | Status: AC
  Administered 2019-12-15: 50 mL via INTRAVENOUS
  Filled 2019-12-15: qty 50

## 2019-12-15 NOTE — Code Documentation (Signed)
Stroke RN working with another Code Stroke when Dr. Tomi Bamberger approached me about this patient. He was lethargic not answering any questions. Pt had been at the grocery store with his family when they noted a sudden onset of becoming unresponsive. EMS was called and transported the patient. According to EMS CBG was 71 and they gave oral glucose to increase to 96.   Upon arrival, pt continued to be unresponsive to EDP and patient was brought to CT. Upon arrival to CT, I evaluated patient. He was lethargic able to be aroused with sternal rub. Answered age and name with slurred speech. Able to move both extremities equally, bilateral leg amputation noted. CBG rechecked at this time and noted to by 29.   MD Tomi Bamberger gave verbal order for 1 amp of D50. Given and then patient continued to get CT Head Scan. CT head completed and patient started to become more awake. Upon arrival to the ED room, pt was able to answer all questions. No unilateral weakness, visual changes, or aphasia noted. See NIHSS completed.   Pt remained drowsy, but easily aroused. Pt reports being insulin dependent, but does not remember taking insulin today. He also goes to dialysis MWF and completed yesterday per normal. Pt not activated as a Code Stroke. MD Leonel Ramsay consulted, but felt all symptoms are blood sugar related. Handoff given with Gwendolyn Fill, RN

## 2019-12-15 NOTE — ED Provider Notes (Signed)
I personally evaluated the patient and placed orders (if any) at  2:56 PM on December 15, 2019.  Patient presented to the ED by EMS.  Patient apparently was shopping with family at [facility] when he became unresponsive.  He suddenly sat down and stop responding.  While at [facility] the patient was given oral glucose.  Per EMS he was not hypoglycemic.  In the ED the patient is not responding to questions or commands.  Unable to obtain full history.  Physical Exam  BP (!) 199/104   Pulse 67   Temp (!) 92.6 F (33.7 C) (Rectal)   Resp 18   SpO2 98%   Physical Exam HEENT no signs of trauma Cardiac regular rate and rhythm Lungs clear to auscultation Abdomen soft, scaphoid Neurologic: Patient's eyes are open, does not respond to verbal stimuli, ED Course/Procedures     Procedures  MDM  Patient presents with altered mental status.  Presentation is concerning for the possibility of stroke, hemorrhage, seizure.  Reportedly sugar is normal but hypoglycemia is also concerned.  Patient appears critically ill.   Will proceed with workup, emergent head ct.  Continue to monitor closely.  Care will be transferred to oncoming team.        Dorie Rank, MD 12/15/19 1459

## 2019-12-15 NOTE — ED Notes (Signed)
Pt to be discharged; report called to [facility] on [address].  Per Melia, transport is not available from them.

## 2019-12-15 NOTE — Progress Notes (Signed)
Initially was going to be activated as a code stroke, and I saw him briefly, lethargic, but non-focal. CBG showe dglucose of 29 and his exam markedly improved with glucose administration. Code stroke canceled, neurology will be available on an as needed basis.   Roland Rack, MD [facility] [phone]  If 7pm- 7am, please page neurology on call as listed in [place].
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
## 2019-12-15 NOTE — ED Notes (Signed)
Called [facility] to notify of transport home; no answer x 2.

## 2019-12-15 NOTE — Discharge Instructions (Addendum)
It is very important that you are eating regular meals.  You should eat a full dinner tonight. You will need to follow-up with your primary care doctor for recheck of your symptoms and possible adjustment of your medication if you continue to have hypoglycemia episodes Return to the emergency room if you develop fevers, recurrent confusion, any new, worsening, or concerning symptoms.

## 2019-12-15 NOTE — ED Triage Notes (Signed)
Pt arrives from [facility] via EMS where he was shopping with family and they report that he just sat down and stopped responding. EMS reports that family was not able to provide much of a history for pt beside recent (several months ago brain bleed and pt is dialysis pt but they do not know schedule or last tx. Pt vacillates between complete unresponsiveness to agitation. Pt will not follow commands. BG 72 and 95 with EMS, [facility] administered 1 glucose tab to pt on scene per EMS. 20g IV established in [place]. Pt is diaphoretic, not responding to painful stimuli or voice and will not follow commands at this time. BP 200/100 with EMS and HR in 80's.

## 2019-12-15 NOTE — Social Work (Signed)
CSW consulted by floor RN to assist Pt with transportation back to [facility]. CSW coordinated transportation via [employer]

## 2019-12-15 NOTE — ED Notes (Signed)
[facility] aware that patient is being transported back to them by Melburn Popper in a blue Subaru.

## 2019-12-15 NOTE — ED Notes (Signed)
Pt assisted with eating a [place] sandwich

## 2019-12-16 DIAGNOSIS — D631 Anemia in chronic kidney disease: Secondary | ICD-10-CM | POA: Diagnosis not present

## 2019-12-16 DIAGNOSIS — N2581 Secondary hyperparathyroidism of renal origin: Secondary | ICD-10-CM | POA: Diagnosis not present

## 2019-12-16 DIAGNOSIS — Z992 Dependence on renal dialysis: Secondary | ICD-10-CM | POA: Diagnosis not present

## 2019-12-16 DIAGNOSIS — N186 End stage renal disease: Secondary | ICD-10-CM | POA: Diagnosis not present

## 2019-12-16 DIAGNOSIS — D509 Iron deficiency anemia, unspecified: Secondary | ICD-10-CM | POA: Diagnosis not present

## 2019-12-16 DIAGNOSIS — T8249XA Other complication of vascular dialysis catheter, initial encounter: Secondary | ICD-10-CM | POA: Diagnosis not present

## 2019-12-16 DIAGNOSIS — E1122 Type 2 diabetes mellitus with diabetic chronic kidney disease: Secondary | ICD-10-CM | POA: Diagnosis not present

## 2019-12-16 DIAGNOSIS — E11649 Type 2 diabetes mellitus with hypoglycemia without coma: Secondary | ICD-10-CM | POA: Diagnosis not present

## 2019-12-18 DIAGNOSIS — Z992 Dependence on renal dialysis: Secondary | ICD-10-CM | POA: Diagnosis not present

## 2019-12-18 DIAGNOSIS — D631 Anemia in chronic kidney disease: Secondary | ICD-10-CM | POA: Diagnosis not present

## 2019-12-18 DIAGNOSIS — T8249XA Other complication of vascular dialysis catheter, initial encounter: Secondary | ICD-10-CM | POA: Diagnosis not present

## 2019-12-18 DIAGNOSIS — N2581 Secondary hyperparathyroidism of renal origin: Secondary | ICD-10-CM | POA: Diagnosis not present

## 2019-12-18 DIAGNOSIS — N186 End stage renal disease: Secondary | ICD-10-CM | POA: Diagnosis not present

## 2019-12-18 DIAGNOSIS — E1122 Type 2 diabetes mellitus with diabetic chronic kidney disease: Secondary | ICD-10-CM | POA: Diagnosis not present

## 2019-12-18 DIAGNOSIS — D509 Iron deficiency anemia, unspecified: Secondary | ICD-10-CM | POA: Diagnosis not present

## 2019-12-19 DIAGNOSIS — N186 End stage renal disease: Secondary | ICD-10-CM | POA: Diagnosis not present

## 2019-12-19 DIAGNOSIS — I629 Nontraumatic intracranial hemorrhage, unspecified: Secondary | ICD-10-CM | POA: Diagnosis not present

## 2019-12-19 DIAGNOSIS — I10 Essential (primary) hypertension: Secondary | ICD-10-CM | POA: Diagnosis not present

## 2019-12-19 DIAGNOSIS — E1122 Type 2 diabetes mellitus with diabetic chronic kidney disease: Secondary | ICD-10-CM | POA: Diagnosis not present

## 2019-12-21 DIAGNOSIS — D631 Anemia in chronic kidney disease: Secondary | ICD-10-CM | POA: Diagnosis not present

## 2019-12-21 DIAGNOSIS — N186 End stage renal disease: Secondary | ICD-10-CM | POA: Diagnosis not present

## 2019-12-21 DIAGNOSIS — S81801D Unspecified open wound, right lower leg, subsequent encounter: Secondary | ICD-10-CM | POA: Diagnosis not present

## 2019-12-21 DIAGNOSIS — Z992 Dependence on renal dialysis: Secondary | ICD-10-CM | POA: Diagnosis not present

## 2019-12-21 DIAGNOSIS — E1122 Type 2 diabetes mellitus with diabetic chronic kidney disease: Secondary | ICD-10-CM | POA: Diagnosis not present

## 2019-12-21 DIAGNOSIS — T8249XA Other complication of vascular dialysis catheter, initial encounter: Secondary | ICD-10-CM | POA: Diagnosis not present

## 2019-12-21 DIAGNOSIS — I10 Essential (primary) hypertension: Secondary | ICD-10-CM | POA: Diagnosis not present

## 2019-12-21 DIAGNOSIS — N2581 Secondary hyperparathyroidism of renal origin: Secondary | ICD-10-CM | POA: Diagnosis not present

## 2019-12-21 DIAGNOSIS — D509 Iron deficiency anemia, unspecified: Secondary | ICD-10-CM | POA: Diagnosis not present

## 2019-12-22 DIAGNOSIS — N186 End stage renal disease: Secondary | ICD-10-CM | POA: Diagnosis not present

## 2019-12-22 DIAGNOSIS — T8453XA Infection and inflammatory reaction due to internal right knee prosthesis, initial encounter: Secondary | ICD-10-CM | POA: Diagnosis not present

## 2019-12-22 DIAGNOSIS — Z8673 Personal history of transient ischemic attack (TIA), and cerebral infarction without residual deficits: Secondary | ICD-10-CM | POA: Diagnosis not present

## 2019-12-22 DIAGNOSIS — Z4902 Encounter for fitting and adjustment of peritoneal dialysis catheter: Secondary | ICD-10-CM | POA: Diagnosis not present

## 2019-12-23 DIAGNOSIS — D509 Iron deficiency anemia, unspecified: Secondary | ICD-10-CM | POA: Diagnosis not present

## 2019-12-23 DIAGNOSIS — D631 Anemia in chronic kidney disease: Secondary | ICD-10-CM | POA: Diagnosis not present

## 2019-12-23 DIAGNOSIS — E1122 Type 2 diabetes mellitus with diabetic chronic kidney disease: Secondary | ICD-10-CM | POA: Diagnosis not present

## 2019-12-23 DIAGNOSIS — Z992 Dependence on renal dialysis: Secondary | ICD-10-CM | POA: Diagnosis not present

## 2019-12-23 DIAGNOSIS — N2581 Secondary hyperparathyroidism of renal origin: Secondary | ICD-10-CM | POA: Diagnosis not present

## 2019-12-23 DIAGNOSIS — N186 End stage renal disease: Secondary | ICD-10-CM | POA: Diagnosis not present

## 2019-12-23 DIAGNOSIS — T8249XA Other complication of vascular dialysis catheter, initial encounter: Secondary | ICD-10-CM | POA: Diagnosis not present

## 2019-12-25 DIAGNOSIS — N186 End stage renal disease: Secondary | ICD-10-CM | POA: Diagnosis not present

## 2019-12-25 DIAGNOSIS — Z992 Dependence on renal dialysis: Secondary | ICD-10-CM | POA: Diagnosis not present

## 2019-12-25 DIAGNOSIS — I10 Essential (primary) hypertension: Secondary | ICD-10-CM | POA: Diagnosis not present

## 2019-12-25 DIAGNOSIS — S81801D Unspecified open wound, right lower leg, subsequent encounter: Secondary | ICD-10-CM | POA: Diagnosis not present

## 2019-12-25 DIAGNOSIS — T148XXA Other injury of unspecified body region, initial encounter: Secondary | ICD-10-CM | POA: Diagnosis not present

## 2019-12-25 DIAGNOSIS — E1122 Type 2 diabetes mellitus with diabetic chronic kidney disease: Secondary | ICD-10-CM | POA: Diagnosis not present

## 2019-12-25 DIAGNOSIS — D631 Anemia in chronic kidney disease: Secondary | ICD-10-CM | POA: Diagnosis not present

## 2019-12-25 DIAGNOSIS — N2581 Secondary hyperparathyroidism of renal origin: Secondary | ICD-10-CM | POA: Diagnosis not present

## 2019-12-25 DIAGNOSIS — T8249XA Other complication of vascular dialysis catheter, initial encounter: Secondary | ICD-10-CM | POA: Diagnosis not present

## 2019-12-25 DIAGNOSIS — D509 Iron deficiency anemia, unspecified: Secondary | ICD-10-CM | POA: Diagnosis not present

## 2019-12-25 DIAGNOSIS — Z89519 Acquired absence of unspecified leg below knee: Secondary | ICD-10-CM | POA: Diagnosis not present

## 2019-12-28 DIAGNOSIS — D631 Anemia in chronic kidney disease: Secondary | ICD-10-CM | POA: Diagnosis not present

## 2019-12-28 DIAGNOSIS — T8249XA Other complication of vascular dialysis catheter, initial encounter: Secondary | ICD-10-CM | POA: Diagnosis not present

## 2019-12-28 DIAGNOSIS — D509 Iron deficiency anemia, unspecified: Secondary | ICD-10-CM | POA: Diagnosis not present

## 2019-12-28 DIAGNOSIS — E1122 Type 2 diabetes mellitus with diabetic chronic kidney disease: Secondary | ICD-10-CM | POA: Diagnosis not present

## 2019-12-28 DIAGNOSIS — Z992 Dependence on renal dialysis: Secondary | ICD-10-CM | POA: Diagnosis not present

## 2019-12-28 DIAGNOSIS — N186 End stage renal disease: Secondary | ICD-10-CM | POA: Diagnosis not present

## 2019-12-28 DIAGNOSIS — N2581 Secondary hyperparathyroidism of renal origin: Secondary | ICD-10-CM | POA: Diagnosis not present

## 2019-12-30 ENCOUNTER — Emergency Department (HOSPITAL_COMMUNITY)
Admission: EM | Admit: 2019-12-30 | Discharge: 2019-12-31 | Disposition: A | Discharge status: Home / Self Care | Payer: Medicare Other | Attending: Emergency Medicine | Admitting: Emergency Medicine

## 2019-12-30 ENCOUNTER — Encounter (HOSPITAL_COMMUNITY): Payer: Self-pay | Admitting: Emergency Medicine

## 2019-12-30 DIAGNOSIS — E1122 Type 2 diabetes mellitus with diabetic chronic kidney disease: Secondary | ICD-10-CM | POA: Insufficient documentation

## 2019-12-30 DIAGNOSIS — Z794 Long term (current) use of insulin: Secondary | ICD-10-CM | POA: Insufficient documentation

## 2019-12-30 DIAGNOSIS — Z7982 Long term (current) use of aspirin: Secondary | ICD-10-CM | POA: Insufficient documentation

## 2019-12-30 DIAGNOSIS — I120 Hypertensive chronic kidney disease with stage 5 chronic kidney disease or end stage renal disease: Secondary | ICD-10-CM | POA: Diagnosis not present

## 2019-12-30 DIAGNOSIS — Z87891 Personal history of nicotine dependence: Secondary | ICD-10-CM | POA: Insufficient documentation

## 2019-12-30 DIAGNOSIS — Z79899 Other long term (current) drug therapy: Secondary | ICD-10-CM | POA: Diagnosis not present

## 2019-12-30 DIAGNOSIS — R112 Nausea with vomiting, unspecified: Secondary | ICD-10-CM | POA: Diagnosis not present

## 2019-12-30 DIAGNOSIS — Z992 Dependence on renal dialysis: Secondary | ICD-10-CM | POA: Diagnosis not present

## 2019-12-30 DIAGNOSIS — E039 Hypothyroidism, unspecified: Secondary | ICD-10-CM | POA: Insufficient documentation

## 2019-12-30 DIAGNOSIS — N186 End stage renal disease: Secondary | ICD-10-CM | POA: Insufficient documentation

## 2019-12-30 DIAGNOSIS — R6889 Other general symptoms and signs: Secondary | ICD-10-CM | POA: Diagnosis not present

## 2019-12-30 DIAGNOSIS — R109 Unspecified abdominal pain: Secondary | ICD-10-CM | POA: Diagnosis not present

## 2019-12-30 DIAGNOSIS — Z743 Need for continuous supervision: Secondary | ICD-10-CM | POA: Diagnosis not present

## 2019-12-30 LAB — COMPREHENSIVE METABOLIC PANEL
ALT: 14 U/L (ref 0–44)
AST: 21 U/L (ref 15–41)
Albumin: 3 g/dL — ABNORMAL LOW (ref 3.5–5.0)
Alkaline Phosphatase: 97 U/L (ref 38–126)
Anion gap: 18 — ABNORMAL HIGH (ref 5–15)
BUN: 39 mg/dL — ABNORMAL HIGH (ref 6–20)
CO2: 27 mmol/L (ref 22–32)
Calcium: 9.6 mg/dL (ref 8.9–10.3)
Chloride: 91 mmol/L — ABNORMAL LOW (ref 98–111)
Creatinine, Ser: 9.05 mg/dL — ABNORMAL HIGH (ref 0.61–1.24)
GFR, Estimated: 6 mL/min — ABNORMAL LOW (ref >60)
Glucose, Bld: 254 mg/dL — ABNORMAL HIGH (ref 70–99)
Potassium: 4.6 mmol/L (ref 3.5–5.1)
Sodium: 136 mmol/L (ref 135–145)
Total Bilirubin: 0.7 mg/dL (ref 0.3–1.2)
Total Protein: 7.2 g/dL (ref 6.5–8.1)

## 2019-12-30 LAB — CBC
HCT: 30.7 % — ABNORMAL LOW (ref 39.0–52.0)
Hemoglobin: 9.9 g/dL — ABNORMAL LOW (ref 13.0–17.0)
MCH: 27.6 pg (ref 26.0–34.0)
MCHC: 32.2 g/dL (ref 30.0–36.0)
MCV: 85.5 fL (ref 80.0–100.0)
Platelets: 129 10*3/uL — ABNORMAL LOW (ref 150–400)
RBC: 3.59 MIL/uL — ABNORMAL LOW (ref 4.22–5.81)
RDW: 17.8 % — ABNORMAL HIGH (ref 11.5–15.5)
WBC: 5.5 10*3/uL (ref 4.0–10.5)
nRBC: 0 % (ref 0.0–0.2)

## 2019-12-30 LAB — LIPASE, BLOOD: Lipase: 19 U/L (ref 11–51)

## 2019-12-30 LAB — CBG MONITORING, ED: Glucose-Capillary: 254 mg/dL — ABNORMAL HIGH (ref 70–99)

## 2019-12-30 NOTE — ED Notes (Signed)
Called for [facility] x2, no response.

## 2019-12-30 NOTE — ED Triage Notes (Signed)
Pt transported from [facility] for emesis since yesterday, MWF HD

## 2019-12-31 ENCOUNTER — Telehealth: Payer: Self-pay | Admitting: *Deleted

## 2019-12-31 ENCOUNTER — Other Ambulatory Visit (HOSPITAL_COMMUNITY): Payer: Self-pay | Admitting: Physician Assistant

## 2019-12-31 DIAGNOSIS — R112 Nausea with vomiting, unspecified: Secondary | ICD-10-CM | POA: Diagnosis not present

## 2019-12-31 LAB — CBG MONITORING, ED: Glucose-Capillary: 172 mg/dL — ABNORMAL HIGH (ref 70–99)

## 2019-12-31 MED ORDER — INSULIN GLARGINE 100 UNIT/ML ~~LOC~~ SOLN
2.0000 [IU] | Freq: Once | SUBCUTANEOUS | Status: DC
  Filled 2019-12-31: qty 0.02

## 2019-12-31 MED ORDER — LABETALOL HCL 5 MG/ML IV SOLN: 5.0000 mg | Freq: Once | INTRAVENOUS | Status: DC

## 2019-12-31 MED ORDER — PROMETHAZINE HCL 25 MG PO TABS: 25.0000 mg | ORAL_TABLET | Freq: Four times a day (QID) | ORAL | 0 refills | Status: DC | PRN

## 2019-12-31 MED ORDER — METOCLOPRAMIDE HCL 5 MG/ML IJ SOLN
10.0000 mg | Freq: Once | INTRAMUSCULAR | Status: DC
  Filled 2019-12-31: qty 2

## 2019-12-31 MED ORDER — SODIUM CHLORIDE 0.9 % IV BOLUS
500.0000 mL | Freq: Once | INTRAVENOUS | Status: DC
  Administered 2019-12-31: 05:00:00 500 mL via INTRAVENOUS

## 2019-12-31 MED ORDER — INSULIN ASPART 100 UNIT/ML ~~LOC~~ SOLN: 5.0000 [IU] | Freq: Once | SUBCUTANEOUS | Status: DC

## 2019-12-31 MED ORDER — METOCLOPRAMIDE HCL 5 MG/ML IJ SOLN
10.0000 mg | Freq: Once | INTRAMUSCULAR | Status: AC
  Administered 2019-12-31: 04:00:00 10 mg via INTRAMUSCULAR
  Filled 2019-12-31: qty 2

## 2019-12-31 MED ORDER — PROMETHAZINE HCL 25 MG PO TABS
25.0000 mg | ORAL_TABLET | Freq: Four times a day (QID) | ORAL | 0 refills | Status: DC | PRN
Start: 2019-12-31 — End: 2019-12-31

## 2019-12-31 MED ORDER — PROMETHAZINE HCL 25 MG/ML IJ SOLN
12.5000 mg | Freq: Once | INTRAMUSCULAR | Status: AC
  Administered 2019-12-31: 09:00:00 12.5 mg via INTRAVENOUS
  Filled 2019-12-31: qty 1

## 2019-12-31 MED ORDER — PROMETHAZINE HCL 25 MG/ML IJ SOLN
25.0000 mg | Freq: Once | INTRAMUSCULAR | Status: AC
  Administered 2019-12-31: 07:00:00 25 mg via INTRAVENOUS
  Filled 2019-12-31: qty 1

## 2019-12-31 MED FILL — PROMETHAZINE 25 MG TABLET: 25 | 7 days supply | Qty: 30 | Fill #0
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
## 2019-12-31 NOTE — Discharge Planning (Signed)
RNCM consulted regarding pt returning to facility via ambulance needing Rx.  RNCM contacted [facility] who will deliver Rx to pt at bedside prior to discharge home today.

## 2019-12-31 NOTE — ED Notes (Signed)
[place] and spoke with [place] re: patient dc instructions and rx. Patient in route at this time.

## 2019-12-31 NOTE — Discharge Planning (Signed)
RNCM consulted regarding transportation needs for pt.  RNCM provided [facility] Transportation service as pt has no transportation from hospital.  RNCM presented and explained [place] and Release of Liability Form.  Pt signed waiver, therefore agreeing to written terms.

## 2019-12-31 NOTE — Telephone Encounter (Signed)
Scheduled transportation.

## 2019-12-31 NOTE — ED Notes (Signed)
Received care of patient at [facility]. Patient up for dc at that time. When given AVS, patient indicated he was still extremely nauseated and felt no improvement in symptoms that brought him in. Patient with vomit on face, blankets, and full emesis bag. Conferred with MD team and additional dose of phenergan ordered. Patient with no more nausea or vomiting as of 1045. Also worked with CSW for medication that was prescribed to be brought to the ED from in house outpatient pharmacy as well as transport back to [facility]. Patient resting and calm at this time awaiting transport.

## 2020-01-01 DIAGNOSIS — N186 End stage renal disease: Secondary | ICD-10-CM | POA: Diagnosis not present

## 2020-01-01 DIAGNOSIS — D631 Anemia in chronic kidney disease: Secondary | ICD-10-CM | POA: Diagnosis not present

## 2020-01-01 DIAGNOSIS — T8249XA Other complication of vascular dialysis catheter, initial encounter: Secondary | ICD-10-CM | POA: Diagnosis not present

## 2020-01-01 DIAGNOSIS — Z992 Dependence on renal dialysis: Secondary | ICD-10-CM | POA: Diagnosis not present

## 2020-01-01 DIAGNOSIS — E1122 Type 2 diabetes mellitus with diabetic chronic kidney disease: Secondary | ICD-10-CM | POA: Diagnosis not present

## 2020-01-01 DIAGNOSIS — N2581 Secondary hyperparathyroidism of renal origin: Secondary | ICD-10-CM | POA: Diagnosis not present

## 2020-01-01 DIAGNOSIS — D509 Iron deficiency anemia, unspecified: Secondary | ICD-10-CM | POA: Diagnosis not present

## 2020-01-04 DIAGNOSIS — D631 Anemia in chronic kidney disease: Secondary | ICD-10-CM | POA: Diagnosis not present

## 2020-01-04 DIAGNOSIS — Z992 Dependence on renal dialysis: Secondary | ICD-10-CM | POA: Diagnosis not present

## 2020-01-04 DIAGNOSIS — N2581 Secondary hyperparathyroidism of renal origin: Secondary | ICD-10-CM | POA: Diagnosis not present

## 2020-01-04 DIAGNOSIS — N186 End stage renal disease: Secondary | ICD-10-CM | POA: Diagnosis not present

## 2020-01-04 DIAGNOSIS — E1122 Type 2 diabetes mellitus with diabetic chronic kidney disease: Secondary | ICD-10-CM | POA: Diagnosis not present

## 2020-01-04 DIAGNOSIS — D509 Iron deficiency anemia, unspecified: Secondary | ICD-10-CM | POA: Diagnosis not present

## 2020-01-04 DIAGNOSIS — T8249XA Other complication of vascular dialysis catheter, initial encounter: Secondary | ICD-10-CM | POA: Diagnosis not present

## 2020-01-06 DIAGNOSIS — E1122 Type 2 diabetes mellitus with diabetic chronic kidney disease: Secondary | ICD-10-CM | POA: Diagnosis not present

## 2020-01-06 DIAGNOSIS — Z992 Dependence on renal dialysis: Secondary | ICD-10-CM | POA: Diagnosis not present

## 2020-01-06 DIAGNOSIS — D509 Iron deficiency anemia, unspecified: Secondary | ICD-10-CM | POA: Diagnosis not present

## 2020-01-06 DIAGNOSIS — T8249XA Other complication of vascular dialysis catheter, initial encounter: Secondary | ICD-10-CM | POA: Diagnosis not present

## 2020-01-06 DIAGNOSIS — N186 End stage renal disease: Secondary | ICD-10-CM | POA: Diagnosis not present

## 2020-01-06 DIAGNOSIS — D631 Anemia in chronic kidney disease: Secondary | ICD-10-CM | POA: Diagnosis not present

## 2020-01-06 DIAGNOSIS — N2581 Secondary hyperparathyroidism of renal origin: Secondary | ICD-10-CM | POA: Diagnosis not present

## 2020-01-07 DIAGNOSIS — E1143 Type 2 diabetes mellitus with diabetic autonomic (poly)neuropathy: Secondary | ICD-10-CM | POA: Diagnosis not present

## 2020-01-07 DIAGNOSIS — Z8673 Personal history of transient ischemic attack (TIA), and cerebral infarction without residual deficits: Secondary | ICD-10-CM | POA: Diagnosis not present

## 2020-01-07 DIAGNOSIS — Z4902 Encounter for fitting and adjustment of peritoneal dialysis catheter: Secondary | ICD-10-CM | POA: Diagnosis not present

## 2020-01-07 DIAGNOSIS — N185 Chronic kidney disease, stage 5: Secondary | ICD-10-CM | POA: Diagnosis not present

## 2020-01-07 DIAGNOSIS — N186 End stage renal disease: Secondary | ICD-10-CM | POA: Diagnosis not present

## 2020-01-08 DIAGNOSIS — D631 Anemia in chronic kidney disease: Secondary | ICD-10-CM | POA: Diagnosis not present

## 2020-01-08 DIAGNOSIS — E1122 Type 2 diabetes mellitus with diabetic chronic kidney disease: Secondary | ICD-10-CM | POA: Diagnosis not present

## 2020-01-08 DIAGNOSIS — Z992 Dependence on renal dialysis: Secondary | ICD-10-CM | POA: Diagnosis not present

## 2020-01-08 DIAGNOSIS — T8249XA Other complication of vascular dialysis catheter, initial encounter: Secondary | ICD-10-CM | POA: Diagnosis not present

## 2020-01-08 DIAGNOSIS — N186 End stage renal disease: Secondary | ICD-10-CM | POA: Diagnosis not present

## 2020-01-08 DIAGNOSIS — D509 Iron deficiency anemia, unspecified: Secondary | ICD-10-CM | POA: Diagnosis not present

## 2020-01-08 DIAGNOSIS — N2581 Secondary hyperparathyroidism of renal origin: Secondary | ICD-10-CM | POA: Diagnosis not present

## 2020-01-11 DIAGNOSIS — Z89519 Acquired absence of unspecified leg below knee: Secondary | ICD-10-CM | POA: Diagnosis not present

## 2020-01-11 DIAGNOSIS — Z992 Dependence on renal dialysis: Secondary | ICD-10-CM | POA: Diagnosis not present

## 2020-01-11 DIAGNOSIS — N186 End stage renal disease: Secondary | ICD-10-CM | POA: Diagnosis not present

## 2020-01-11 DIAGNOSIS — T7840XS Allergy, unspecified, sequela: Secondary | ICD-10-CM | POA: Diagnosis not present

## 2020-01-11 DIAGNOSIS — E1122 Type 2 diabetes mellitus with diabetic chronic kidney disease: Secondary | ICD-10-CM | POA: Diagnosis not present

## 2020-01-11 DIAGNOSIS — S81801D Unspecified open wound, right lower leg, subsequent encounter: Secondary | ICD-10-CM | POA: Diagnosis not present

## 2020-01-11 DIAGNOSIS — T148XXA Other injury of unspecified body region, initial encounter: Secondary | ICD-10-CM | POA: Diagnosis not present

## 2020-01-11 DIAGNOSIS — D631 Anemia in chronic kidney disease: Secondary | ICD-10-CM | POA: Diagnosis not present

## 2020-01-11 DIAGNOSIS — T782XXD Anaphylactic shock, unspecified, subsequent encounter: Secondary | ICD-10-CM | POA: Diagnosis not present

## 2020-01-11 DIAGNOSIS — K3184 Gastroparesis: Secondary | ICD-10-CM | POA: Diagnosis not present

## 2020-01-11 DIAGNOSIS — I10 Essential (primary) hypertension: Secondary | ICD-10-CM | POA: Diagnosis not present

## 2020-01-11 DIAGNOSIS — D509 Iron deficiency anemia, unspecified: Secondary | ICD-10-CM | POA: Diagnosis not present

## 2020-01-11 DIAGNOSIS — N2581 Secondary hyperparathyroidism of renal origin: Secondary | ICD-10-CM | POA: Diagnosis not present

## 2020-01-12 ENCOUNTER — Encounter: Payer: Self-pay | Admitting: Orthopedic Surgery

## 2020-01-12 ENCOUNTER — Ambulatory Visit (INDEPENDENT_AMBULATORY_CARE_PROVIDER_SITE_OTHER): Payer: Medicare Other | Admitting: Physician Assistant

## 2020-01-12 VITALS — Ht 70.0 in | Wt 163.0 lb

## 2020-01-12 DIAGNOSIS — Z89512 Acquired absence of left leg below knee: Secondary | ICD-10-CM

## 2020-01-12 DIAGNOSIS — Z89511 Acquired absence of right leg below knee: Secondary | ICD-10-CM

## 2020-01-12 DIAGNOSIS — E118 Type 2 diabetes mellitus with unspecified complications: Secondary | ICD-10-CM | POA: Diagnosis not present
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
## 2020-01-13 DIAGNOSIS — D631 Anemia in chronic kidney disease: Secondary | ICD-10-CM | POA: Diagnosis not present

## 2020-01-13 DIAGNOSIS — D509 Iron deficiency anemia, unspecified: Secondary | ICD-10-CM | POA: Diagnosis not present

## 2020-01-13 DIAGNOSIS — N186 End stage renal disease: Secondary | ICD-10-CM | POA: Diagnosis not present

## 2020-01-13 DIAGNOSIS — T782XXD Anaphylactic shock, unspecified, subsequent encounter: Secondary | ICD-10-CM | POA: Diagnosis not present

## 2020-01-13 DIAGNOSIS — E1122 Type 2 diabetes mellitus with diabetic chronic kidney disease: Secondary | ICD-10-CM | POA: Diagnosis not present

## 2020-01-13 DIAGNOSIS — N2581 Secondary hyperparathyroidism of renal origin: Secondary | ICD-10-CM | POA: Diagnosis not present

## 2020-01-13 DIAGNOSIS — T7840XS Allergy, unspecified, sequela: Secondary | ICD-10-CM | POA: Diagnosis not present

## 2020-01-13 DIAGNOSIS — Z992 Dependence on renal dialysis: Secondary | ICD-10-CM | POA: Diagnosis not present

## 2020-01-14 DIAGNOSIS — I120 Hypertensive chronic kidney disease with stage 5 chronic kidney disease or end stage renal disease: Secondary | ICD-10-CM | POA: Diagnosis not present

## 2020-01-14 DIAGNOSIS — Z89512 Acquired absence of left leg below knee: Secondary | ICD-10-CM | POA: Diagnosis not present

## 2020-01-14 DIAGNOSIS — Z89511 Acquired absence of right leg below knee: Secondary | ICD-10-CM | POA: Diagnosis not present

## 2020-01-15 DIAGNOSIS — Z992 Dependence on renal dialysis: Secondary | ICD-10-CM | POA: Diagnosis not present

## 2020-01-15 DIAGNOSIS — D631 Anemia in chronic kidney disease: Secondary | ICD-10-CM | POA: Diagnosis not present

## 2020-01-15 DIAGNOSIS — T782XXD Anaphylactic shock, unspecified, subsequent encounter: Secondary | ICD-10-CM | POA: Diagnosis not present

## 2020-01-15 DIAGNOSIS — E1122 Type 2 diabetes mellitus with diabetic chronic kidney disease: Secondary | ICD-10-CM | POA: Diagnosis not present

## 2020-01-15 DIAGNOSIS — T7840XS Allergy, unspecified, sequela: Secondary | ICD-10-CM | POA: Diagnosis not present

## 2020-01-15 DIAGNOSIS — D509 Iron deficiency anemia, unspecified: Secondary | ICD-10-CM | POA: Diagnosis not present

## 2020-01-15 DIAGNOSIS — N2581 Secondary hyperparathyroidism of renal origin: Secondary | ICD-10-CM | POA: Diagnosis not present

## 2020-01-15 DIAGNOSIS — N186 End stage renal disease: Secondary | ICD-10-CM | POA: Diagnosis not present

## 2020-01-18 DIAGNOSIS — E1122 Type 2 diabetes mellitus with diabetic chronic kidney disease: Secondary | ICD-10-CM | POA: Diagnosis not present

## 2020-01-18 DIAGNOSIS — N186 End stage renal disease: Secondary | ICD-10-CM | POA: Diagnosis not present

## 2020-01-18 DIAGNOSIS — Z992 Dependence on renal dialysis: Secondary | ICD-10-CM | POA: Diagnosis not present

## 2020-01-18 DIAGNOSIS — D631 Anemia in chronic kidney disease: Secondary | ICD-10-CM | POA: Diagnosis not present

## 2020-01-18 DIAGNOSIS — N2581 Secondary hyperparathyroidism of renal origin: Secondary | ICD-10-CM | POA: Diagnosis not present

## 2020-01-18 DIAGNOSIS — T7840XS Allergy, unspecified, sequela: Secondary | ICD-10-CM | POA: Diagnosis not present

## 2020-01-18 DIAGNOSIS — T782XXD Anaphylactic shock, unspecified, subsequent encounter: Secondary | ICD-10-CM | POA: Diagnosis not present

## 2020-01-18 DIAGNOSIS — D509 Iron deficiency anemia, unspecified: Secondary | ICD-10-CM | POA: Diagnosis not present

## 2020-01-19 DIAGNOSIS — G629 Polyneuropathy, unspecified: Secondary | ICD-10-CM | POA: Diagnosis not present

## 2020-01-19 DIAGNOSIS — I619 Nontraumatic intracerebral hemorrhage, unspecified: Secondary | ICD-10-CM | POA: Diagnosis not present

## 2020-01-19 DIAGNOSIS — I10 Essential (primary) hypertension: Secondary | ICD-10-CM | POA: Diagnosis not present

## 2020-01-20 DIAGNOSIS — D631 Anemia in chronic kidney disease: Secondary | ICD-10-CM | POA: Diagnosis not present

## 2020-01-20 DIAGNOSIS — N2581 Secondary hyperparathyroidism of renal origin: Secondary | ICD-10-CM | POA: Diagnosis not present

## 2020-01-20 DIAGNOSIS — T782XXD Anaphylactic shock, unspecified, subsequent encounter: Secondary | ICD-10-CM | POA: Diagnosis not present

## 2020-01-20 DIAGNOSIS — T7840XS Allergy, unspecified, sequela: Secondary | ICD-10-CM | POA: Diagnosis not present

## 2020-01-20 DIAGNOSIS — N186 End stage renal disease: Secondary | ICD-10-CM | POA: Diagnosis not present

## 2020-01-20 DIAGNOSIS — E1122 Type 2 diabetes mellitus with diabetic chronic kidney disease: Secondary | ICD-10-CM | POA: Diagnosis not present

## 2020-01-20 DIAGNOSIS — Z992 Dependence on renal dialysis: Secondary | ICD-10-CM | POA: Diagnosis not present

## 2020-01-20 DIAGNOSIS — D509 Iron deficiency anemia, unspecified: Secondary | ICD-10-CM | POA: Diagnosis not present

## 2020-01-22 DIAGNOSIS — D631 Anemia in chronic kidney disease: Secondary | ICD-10-CM | POA: Diagnosis not present

## 2020-01-22 DIAGNOSIS — E1122 Type 2 diabetes mellitus with diabetic chronic kidney disease: Secondary | ICD-10-CM | POA: Diagnosis not present

## 2020-01-22 DIAGNOSIS — N2581 Secondary hyperparathyroidism of renal origin: Secondary | ICD-10-CM | POA: Diagnosis not present

## 2020-01-22 DIAGNOSIS — D509 Iron deficiency anemia, unspecified: Secondary | ICD-10-CM | POA: Diagnosis not present

## 2020-01-22 DIAGNOSIS — N186 End stage renal disease: Secondary | ICD-10-CM | POA: Diagnosis not present

## 2020-01-22 DIAGNOSIS — T782XXD Anaphylactic shock, unspecified, subsequent encounter: Secondary | ICD-10-CM | POA: Diagnosis not present

## 2020-01-22 DIAGNOSIS — T7840XS Allergy, unspecified, sequela: Secondary | ICD-10-CM | POA: Diagnosis not present

## 2020-01-22 DIAGNOSIS — Z992 Dependence on renal dialysis: Secondary | ICD-10-CM | POA: Diagnosis not present

## 2020-01-25 DIAGNOSIS — T7840XS Allergy, unspecified, sequela: Secondary | ICD-10-CM | POA: Diagnosis not present

## 2020-01-25 DIAGNOSIS — D509 Iron deficiency anemia, unspecified: Secondary | ICD-10-CM | POA: Diagnosis not present

## 2020-01-25 DIAGNOSIS — N186 End stage renal disease: Secondary | ICD-10-CM | POA: Diagnosis not present

## 2020-01-25 DIAGNOSIS — N2581 Secondary hyperparathyroidism of renal origin: Secondary | ICD-10-CM | POA: Diagnosis not present

## 2020-01-25 DIAGNOSIS — D631 Anemia in chronic kidney disease: Secondary | ICD-10-CM | POA: Diagnosis not present

## 2020-01-25 DIAGNOSIS — T782XXD Anaphylactic shock, unspecified, subsequent encounter: Secondary | ICD-10-CM | POA: Diagnosis not present

## 2020-01-25 DIAGNOSIS — Z992 Dependence on renal dialysis: Secondary | ICD-10-CM | POA: Diagnosis not present

## 2020-01-25 DIAGNOSIS — E1122 Type 2 diabetes mellitus with diabetic chronic kidney disease: Secondary | ICD-10-CM | POA: Diagnosis not present

## 2020-01-27 DIAGNOSIS — D631 Anemia in chronic kidney disease: Secondary | ICD-10-CM | POA: Diagnosis not present

## 2020-01-27 DIAGNOSIS — T7840XS Allergy, unspecified, sequela: Secondary | ICD-10-CM | POA: Diagnosis not present

## 2020-01-27 DIAGNOSIS — Z992 Dependence on renal dialysis: Secondary | ICD-10-CM | POA: Diagnosis not present

## 2020-01-27 DIAGNOSIS — N186 End stage renal disease: Secondary | ICD-10-CM | POA: Diagnosis not present

## 2020-01-27 DIAGNOSIS — D509 Iron deficiency anemia, unspecified: Secondary | ICD-10-CM | POA: Diagnosis not present

## 2020-01-27 DIAGNOSIS — N2581 Secondary hyperparathyroidism of renal origin: Secondary | ICD-10-CM | POA: Diagnosis not present

## 2020-01-27 DIAGNOSIS — T782XXD Anaphylactic shock, unspecified, subsequent encounter: Secondary | ICD-10-CM | POA: Diagnosis not present

## 2020-01-27 DIAGNOSIS — E1122 Type 2 diabetes mellitus with diabetic chronic kidney disease: Secondary | ICD-10-CM | POA: Diagnosis not present

## 2020-01-29 DIAGNOSIS — D631 Anemia in chronic kidney disease: Secondary | ICD-10-CM | POA: Diagnosis not present

## 2020-01-29 DIAGNOSIS — N186 End stage renal disease: Secondary | ICD-10-CM | POA: Diagnosis not present

## 2020-01-29 DIAGNOSIS — T7840XS Allergy, unspecified, sequela: Secondary | ICD-10-CM | POA: Diagnosis not present

## 2020-01-29 DIAGNOSIS — T782XXD Anaphylactic shock, unspecified, subsequent encounter: Secondary | ICD-10-CM | POA: Diagnosis not present

## 2020-01-29 DIAGNOSIS — N2581 Secondary hyperparathyroidism of renal origin: Secondary | ICD-10-CM | POA: Diagnosis not present

## 2020-01-29 DIAGNOSIS — D509 Iron deficiency anemia, unspecified: Secondary | ICD-10-CM | POA: Diagnosis not present

## 2020-01-29 DIAGNOSIS — Z992 Dependence on renal dialysis: Secondary | ICD-10-CM | POA: Diagnosis not present

## 2020-01-29 DIAGNOSIS — E1122 Type 2 diabetes mellitus with diabetic chronic kidney disease: Secondary | ICD-10-CM | POA: Diagnosis not present

## 2020-02-01 DIAGNOSIS — H66002 Acute suppurative otitis media without spontaneous rupture of ear drum, left ear: Secondary | ICD-10-CM | POA: Diagnosis not present

## 2020-02-01 DIAGNOSIS — D631 Anemia in chronic kidney disease: Secondary | ICD-10-CM | POA: Diagnosis not present

## 2020-02-01 DIAGNOSIS — Z992 Dependence on renal dialysis: Secondary | ICD-10-CM | POA: Diagnosis not present

## 2020-02-01 DIAGNOSIS — T782XXD Anaphylactic shock, unspecified, subsequent encounter: Secondary | ICD-10-CM | POA: Diagnosis not present

## 2020-02-01 DIAGNOSIS — N186 End stage renal disease: Secondary | ICD-10-CM | POA: Diagnosis not present

## 2020-02-01 DIAGNOSIS — D509 Iron deficiency anemia, unspecified: Secondary | ICD-10-CM | POA: Diagnosis not present

## 2020-02-01 DIAGNOSIS — N2581 Secondary hyperparathyroidism of renal origin: Secondary | ICD-10-CM | POA: Diagnosis not present

## 2020-02-01 DIAGNOSIS — E1122 Type 2 diabetes mellitus with diabetic chronic kidney disease: Secondary | ICD-10-CM | POA: Diagnosis not present

## 2020-02-01 DIAGNOSIS — T7840XS Allergy, unspecified, sequela: Secondary | ICD-10-CM | POA: Diagnosis not present

## 2020-02-02 DIAGNOSIS — E113312 Type 2 diabetes mellitus with moderate nonproliferative diabetic retinopathy with macular edema, left eye: Secondary | ICD-10-CM | POA: Diagnosis not present

## 2020-02-02 DIAGNOSIS — H25813 Combined forms of age-related cataract, bilateral: Secondary | ICD-10-CM | POA: Diagnosis not present

## 2020-02-02 DIAGNOSIS — H31093 Other chorioretinal scars, bilateral: Secondary | ICD-10-CM | POA: Diagnosis not present

## 2020-02-02 DIAGNOSIS — E113391 Type 2 diabetes mellitus with moderate nonproliferative diabetic retinopathy without macular edema, right eye: Secondary | ICD-10-CM | POA: Diagnosis not present

## 2020-02-03 DIAGNOSIS — N2581 Secondary hyperparathyroidism of renal origin: Secondary | ICD-10-CM | POA: Diagnosis not present

## 2020-02-03 DIAGNOSIS — T7840XS Allergy, unspecified, sequela: Secondary | ICD-10-CM | POA: Diagnosis not present

## 2020-02-03 DIAGNOSIS — T782XXD Anaphylactic shock, unspecified, subsequent encounter: Secondary | ICD-10-CM | POA: Diagnosis not present

## 2020-02-03 DIAGNOSIS — N186 End stage renal disease: Secondary | ICD-10-CM | POA: Diagnosis not present

## 2020-02-03 DIAGNOSIS — D509 Iron deficiency anemia, unspecified: Secondary | ICD-10-CM | POA: Diagnosis not present

## 2020-02-03 DIAGNOSIS — E1122 Type 2 diabetes mellitus with diabetic chronic kidney disease: Secondary | ICD-10-CM | POA: Diagnosis not present

## 2020-02-03 DIAGNOSIS — Z992 Dependence on renal dialysis: Secondary | ICD-10-CM | POA: Diagnosis not present

## 2020-02-03 DIAGNOSIS — D631 Anemia in chronic kidney disease: Secondary | ICD-10-CM | POA: Diagnosis not present

## 2020-02-04 DIAGNOSIS — Z4902 Encounter for fitting and adjustment of peritoneal dialysis catheter: Secondary | ICD-10-CM | POA: Diagnosis not present

## 2020-02-05 DIAGNOSIS — Z992 Dependence on renal dialysis: Secondary | ICD-10-CM | POA: Diagnosis not present

## 2020-02-05 DIAGNOSIS — N2581 Secondary hyperparathyroidism of renal origin: Secondary | ICD-10-CM | POA: Diagnosis not present

## 2020-02-05 DIAGNOSIS — N186 End stage renal disease: Secondary | ICD-10-CM | POA: Diagnosis not present

## 2020-02-05 DIAGNOSIS — E1122 Type 2 diabetes mellitus with diabetic chronic kidney disease: Secondary | ICD-10-CM | POA: Diagnosis not present

## 2020-02-05 DIAGNOSIS — T782XXD Anaphylactic shock, unspecified, subsequent encounter: Secondary | ICD-10-CM | POA: Diagnosis not present

## 2020-02-05 DIAGNOSIS — D631 Anemia in chronic kidney disease: Secondary | ICD-10-CM | POA: Diagnosis not present

## 2020-02-05 DIAGNOSIS — T7840XS Allergy, unspecified, sequela: Secondary | ICD-10-CM | POA: Diagnosis not present

## 2020-02-05 DIAGNOSIS — D509 Iron deficiency anemia, unspecified: Secondary | ICD-10-CM | POA: Diagnosis not present

## 2020-02-08 DIAGNOSIS — E1122 Type 2 diabetes mellitus with diabetic chronic kidney disease: Secondary | ICD-10-CM | POA: Diagnosis not present

## 2020-02-08 DIAGNOSIS — T782XXD Anaphylactic shock, unspecified, subsequent encounter: Secondary | ICD-10-CM | POA: Diagnosis not present

## 2020-02-08 DIAGNOSIS — T7840XS Allergy, unspecified, sequela: Secondary | ICD-10-CM | POA: Diagnosis not present

## 2020-02-08 DIAGNOSIS — N186 End stage renal disease: Secondary | ICD-10-CM | POA: Diagnosis not present

## 2020-02-08 DIAGNOSIS — N2581 Secondary hyperparathyroidism of renal origin: Secondary | ICD-10-CM | POA: Diagnosis not present

## 2020-02-08 DIAGNOSIS — D509 Iron deficiency anemia, unspecified: Secondary | ICD-10-CM | POA: Diagnosis not present

## 2020-02-08 DIAGNOSIS — Z992 Dependence on renal dialysis: Secondary | ICD-10-CM | POA: Diagnosis not present

## 2020-02-08 DIAGNOSIS — D631 Anemia in chronic kidney disease: Secondary | ICD-10-CM | POA: Diagnosis not present

## 2020-02-09 DIAGNOSIS — Z992 Dependence on renal dialysis: Secondary | ICD-10-CM | POA: Diagnosis not present

## 2020-02-09 DIAGNOSIS — N186 End stage renal disease: Secondary | ICD-10-CM | POA: Diagnosis not present

## 2020-02-09 DIAGNOSIS — Z89511 Acquired absence of right leg below knee: Secondary | ICD-10-CM | POA: Diagnosis not present

## 2020-02-09 DIAGNOSIS — T82858A Stenosis of vascular prosthetic devices, implants and grafts, initial encounter: Secondary | ICD-10-CM | POA: Diagnosis not present

## 2020-02-09 DIAGNOSIS — Z89512 Acquired absence of left leg below knee: Secondary | ICD-10-CM | POA: Diagnosis not present

## 2020-02-09 DIAGNOSIS — T82898A Other specified complication of vascular prosthetic devices, implants and grafts, initial encounter: Secondary | ICD-10-CM | POA: Diagnosis not present

## 2020-02-09 DIAGNOSIS — I871 Compression of vein: Secondary | ICD-10-CM | POA: Diagnosis not present

## 2020-02-10 DIAGNOSIS — T782XXD Anaphylactic shock, unspecified, subsequent encounter: Secondary | ICD-10-CM | POA: Diagnosis not present

## 2020-02-10 DIAGNOSIS — N186 End stage renal disease: Secondary | ICD-10-CM | POA: Diagnosis not present

## 2020-02-10 DIAGNOSIS — E1122 Type 2 diabetes mellitus with diabetic chronic kidney disease: Secondary | ICD-10-CM | POA: Diagnosis not present

## 2020-02-10 DIAGNOSIS — D509 Iron deficiency anemia, unspecified: Secondary | ICD-10-CM | POA: Diagnosis not present

## 2020-02-10 DIAGNOSIS — T7840XS Allergy, unspecified, sequela: Secondary | ICD-10-CM | POA: Diagnosis not present

## 2020-02-10 DIAGNOSIS — Z992 Dependence on renal dialysis: Secondary | ICD-10-CM | POA: Diagnosis not present

## 2020-02-10 DIAGNOSIS — N2581 Secondary hyperparathyroidism of renal origin: Secondary | ICD-10-CM | POA: Diagnosis not present

## 2020-02-12 DIAGNOSIS — Z992 Dependence on renal dialysis: Secondary | ICD-10-CM | POA: Diagnosis not present

## 2020-02-12 DIAGNOSIS — D509 Iron deficiency anemia, unspecified: Secondary | ICD-10-CM | POA: Diagnosis not present

## 2020-02-12 DIAGNOSIS — T782XXD Anaphylactic shock, unspecified, subsequent encounter: Secondary | ICD-10-CM | POA: Diagnosis not present

## 2020-02-12 DIAGNOSIS — E1122 Type 2 diabetes mellitus with diabetic chronic kidney disease: Secondary | ICD-10-CM | POA: Diagnosis not present

## 2020-02-12 DIAGNOSIS — T7840XS Allergy, unspecified, sequela: Secondary | ICD-10-CM | POA: Diagnosis not present

## 2020-02-12 DIAGNOSIS — N2581 Secondary hyperparathyroidism of renal origin: Secondary | ICD-10-CM | POA: Diagnosis not present

## 2020-02-12 DIAGNOSIS — N186 End stage renal disease: Secondary | ICD-10-CM | POA: Diagnosis not present

## 2020-02-14 DIAGNOSIS — Z89512 Acquired absence of left leg below knee: Secondary | ICD-10-CM | POA: Diagnosis not present

## 2020-02-14 DIAGNOSIS — Z89511 Acquired absence of right leg below knee: Secondary | ICD-10-CM | POA: Diagnosis not present

## 2020-02-14 DIAGNOSIS — I120 Hypertensive chronic kidney disease with stage 5 chronic kidney disease or end stage renal disease: Secondary | ICD-10-CM | POA: Diagnosis not present

## 2020-02-15 DIAGNOSIS — N186 End stage renal disease: Secondary | ICD-10-CM | POA: Diagnosis not present

## 2020-02-15 DIAGNOSIS — Z992 Dependence on renal dialysis: Secondary | ICD-10-CM | POA: Diagnosis not present

## 2020-02-15 DIAGNOSIS — N2581 Secondary hyperparathyroidism of renal origin: Secondary | ICD-10-CM | POA: Diagnosis not present

## 2020-02-15 DIAGNOSIS — T782XXD Anaphylactic shock, unspecified, subsequent encounter: Secondary | ICD-10-CM | POA: Diagnosis not present

## 2020-02-15 DIAGNOSIS — T7840XS Allergy, unspecified, sequela: Secondary | ICD-10-CM | POA: Diagnosis not present

## 2020-02-15 DIAGNOSIS — E1122 Type 2 diabetes mellitus with diabetic chronic kidney disease: Secondary | ICD-10-CM | POA: Diagnosis not present

## 2020-02-15 DIAGNOSIS — D509 Iron deficiency anemia, unspecified: Secondary | ICD-10-CM | POA: Diagnosis not present

## 2020-02-16 ENCOUNTER — Other Ambulatory Visit: Payer: Self-pay

## 2020-02-16 ENCOUNTER — Encounter (INDEPENDENT_AMBULATORY_CARE_PROVIDER_SITE_OTHER): Payer: Medicare Other | Admitting: Ophthalmology

## 2020-02-16 DIAGNOSIS — H35033 Hypertensive retinopathy, bilateral: Secondary | ICD-10-CM | POA: Diagnosis not present

## 2020-02-16 DIAGNOSIS — I10 Essential (primary) hypertension: Secondary | ICD-10-CM | POA: Diagnosis not present

## 2020-02-16 DIAGNOSIS — E113513 Type 2 diabetes mellitus with proliferative diabetic retinopathy with macular edema, bilateral: Secondary | ICD-10-CM | POA: Diagnosis not present

## 2020-02-16 DIAGNOSIS — H43813 Vitreous degeneration, bilateral: Secondary | ICD-10-CM

## 2020-02-17 DIAGNOSIS — T7840XS Allergy, unspecified, sequela: Secondary | ICD-10-CM | POA: Diagnosis not present

## 2020-02-17 DIAGNOSIS — Z992 Dependence on renal dialysis: Secondary | ICD-10-CM | POA: Diagnosis not present

## 2020-02-17 DIAGNOSIS — N186 End stage renal disease: Secondary | ICD-10-CM | POA: Diagnosis not present

## 2020-02-17 DIAGNOSIS — T782XXD Anaphylactic shock, unspecified, subsequent encounter: Secondary | ICD-10-CM | POA: Diagnosis not present

## 2020-02-17 DIAGNOSIS — E1122 Type 2 diabetes mellitus with diabetic chronic kidney disease: Secondary | ICD-10-CM | POA: Diagnosis not present

## 2020-02-17 DIAGNOSIS — D509 Iron deficiency anemia, unspecified: Secondary | ICD-10-CM | POA: Diagnosis not present

## 2020-02-17 DIAGNOSIS — N2581 Secondary hyperparathyroidism of renal origin: Secondary | ICD-10-CM | POA: Diagnosis not present

## 2020-02-19 DIAGNOSIS — E1122 Type 2 diabetes mellitus with diabetic chronic kidney disease: Secondary | ICD-10-CM | POA: Diagnosis not present

## 2020-02-19 DIAGNOSIS — I871 Compression of vein: Secondary | ICD-10-CM | POA: Diagnosis not present

## 2020-02-19 DIAGNOSIS — T782XXD Anaphylactic shock, unspecified, subsequent encounter: Secondary | ICD-10-CM | POA: Diagnosis not present

## 2020-02-19 DIAGNOSIS — N2581 Secondary hyperparathyroidism of renal origin: Secondary | ICD-10-CM | POA: Diagnosis not present

## 2020-02-19 DIAGNOSIS — T82858A Stenosis of vascular prosthetic devices, implants and grafts, initial encounter: Secondary | ICD-10-CM | POA: Diagnosis not present

## 2020-02-19 DIAGNOSIS — N186 End stage renal disease: Secondary | ICD-10-CM | POA: Diagnosis not present

## 2020-02-19 DIAGNOSIS — D509 Iron deficiency anemia, unspecified: Secondary | ICD-10-CM | POA: Diagnosis not present

## 2020-02-19 DIAGNOSIS — Z992 Dependence on renal dialysis: Secondary | ICD-10-CM | POA: Diagnosis not present

## 2020-02-19 DIAGNOSIS — T7840XS Allergy, unspecified, sequela: Secondary | ICD-10-CM | POA: Diagnosis not present

## 2020-02-20 DIAGNOSIS — D509 Iron deficiency anemia, unspecified: Secondary | ICD-10-CM | POA: Diagnosis not present

## 2020-02-20 DIAGNOSIS — E1122 Type 2 diabetes mellitus with diabetic chronic kidney disease: Secondary | ICD-10-CM | POA: Diagnosis not present

## 2020-02-20 DIAGNOSIS — T7840XS Allergy, unspecified, sequela: Secondary | ICD-10-CM | POA: Diagnosis not present

## 2020-02-20 DIAGNOSIS — Z992 Dependence on renal dialysis: Secondary | ICD-10-CM | POA: Diagnosis not present

## 2020-02-20 DIAGNOSIS — N2581 Secondary hyperparathyroidism of renal origin: Secondary | ICD-10-CM | POA: Diagnosis not present

## 2020-02-20 DIAGNOSIS — N186 End stage renal disease: Secondary | ICD-10-CM | POA: Diagnosis not present

## 2020-02-20 DIAGNOSIS — T782XXD Anaphylactic shock, unspecified, subsequent encounter: Secondary | ICD-10-CM | POA: Diagnosis not present

## 2020-02-22 DIAGNOSIS — E1122 Type 2 diabetes mellitus with diabetic chronic kidney disease: Secondary | ICD-10-CM | POA: Diagnosis not present

## 2020-02-22 DIAGNOSIS — Z992 Dependence on renal dialysis: Secondary | ICD-10-CM | POA: Diagnosis not present

## 2020-02-22 DIAGNOSIS — T7840XS Allergy, unspecified, sequela: Secondary | ICD-10-CM | POA: Diagnosis not present

## 2020-02-22 DIAGNOSIS — D509 Iron deficiency anemia, unspecified: Secondary | ICD-10-CM | POA: Diagnosis not present

## 2020-02-22 DIAGNOSIS — S81802D Unspecified open wound, left lower leg, subsequent encounter: Secondary | ICD-10-CM | POA: Diagnosis not present

## 2020-02-22 DIAGNOSIS — S81801D Unspecified open wound, right lower leg, subsequent encounter: Secondary | ICD-10-CM | POA: Diagnosis not present

## 2020-02-22 DIAGNOSIS — N2581 Secondary hyperparathyroidism of renal origin: Secondary | ICD-10-CM | POA: Diagnosis not present

## 2020-02-22 DIAGNOSIS — T782XXD Anaphylactic shock, unspecified, subsequent encounter: Secondary | ICD-10-CM | POA: Diagnosis not present

## 2020-02-22 DIAGNOSIS — I10 Essential (primary) hypertension: Secondary | ICD-10-CM | POA: Diagnosis not present

## 2020-02-22 DIAGNOSIS — N186 End stage renal disease: Secondary | ICD-10-CM | POA: Diagnosis not present

## 2020-02-23 ENCOUNTER — Ambulatory Visit: Payer: Medicare Other | Admitting: Family

## 2020-02-24 DIAGNOSIS — E1122 Type 2 diabetes mellitus with diabetic chronic kidney disease: Secondary | ICD-10-CM | POA: Diagnosis not present

## 2020-02-24 DIAGNOSIS — T782XXD Anaphylactic shock, unspecified, subsequent encounter: Secondary | ICD-10-CM | POA: Diagnosis not present

## 2020-02-24 DIAGNOSIS — T7840XS Allergy, unspecified, sequela: Secondary | ICD-10-CM | POA: Diagnosis not present

## 2020-02-24 DIAGNOSIS — Z992 Dependence on renal dialysis: Secondary | ICD-10-CM | POA: Diagnosis not present

## 2020-02-24 DIAGNOSIS — N186 End stage renal disease: Secondary | ICD-10-CM | POA: Diagnosis not present

## 2020-02-24 DIAGNOSIS — D509 Iron deficiency anemia, unspecified: Secondary | ICD-10-CM | POA: Diagnosis not present

## 2020-02-24 DIAGNOSIS — N2581 Secondary hyperparathyroidism of renal origin: Secondary | ICD-10-CM | POA: Diagnosis not present

## 2020-02-26 DIAGNOSIS — E1122 Type 2 diabetes mellitus with diabetic chronic kidney disease: Secondary | ICD-10-CM | POA: Diagnosis not present

## 2020-02-26 DIAGNOSIS — Z992 Dependence on renal dialysis: Secondary | ICD-10-CM | POA: Diagnosis not present

## 2020-02-26 DIAGNOSIS — N186 End stage renal disease: Secondary | ICD-10-CM | POA: Diagnosis not present

## 2020-02-26 DIAGNOSIS — T7840XS Allergy, unspecified, sequela: Secondary | ICD-10-CM | POA: Diagnosis not present

## 2020-02-26 DIAGNOSIS — T782XXD Anaphylactic shock, unspecified, subsequent encounter: Secondary | ICD-10-CM | POA: Diagnosis not present

## 2020-02-26 DIAGNOSIS — N2581 Secondary hyperparathyroidism of renal origin: Secondary | ICD-10-CM | POA: Diagnosis not present

## 2020-02-26 DIAGNOSIS — D509 Iron deficiency anemia, unspecified: Secondary | ICD-10-CM | POA: Diagnosis not present

## 2020-02-29 DIAGNOSIS — N2581 Secondary hyperparathyroidism of renal origin: Secondary | ICD-10-CM | POA: Diagnosis not present

## 2020-02-29 DIAGNOSIS — T782XXD Anaphylactic shock, unspecified, subsequent encounter: Secondary | ICD-10-CM | POA: Diagnosis not present

## 2020-02-29 DIAGNOSIS — E1122 Type 2 diabetes mellitus with diabetic chronic kidney disease: Secondary | ICD-10-CM | POA: Diagnosis not present

## 2020-02-29 DIAGNOSIS — D509 Iron deficiency anemia, unspecified: Secondary | ICD-10-CM | POA: Diagnosis not present

## 2020-02-29 DIAGNOSIS — N186 End stage renal disease: Secondary | ICD-10-CM | POA: Diagnosis not present

## 2020-02-29 DIAGNOSIS — T7840XS Allergy, unspecified, sequela: Secondary | ICD-10-CM | POA: Diagnosis not present

## 2020-02-29 DIAGNOSIS — Z992 Dependence on renal dialysis: Secondary | ICD-10-CM | POA: Diagnosis not present

## 2020-03-02 DIAGNOSIS — D509 Iron deficiency anemia, unspecified: Secondary | ICD-10-CM | POA: Diagnosis not present

## 2020-03-02 DIAGNOSIS — N2581 Secondary hyperparathyroidism of renal origin: Secondary | ICD-10-CM | POA: Diagnosis not present

## 2020-03-02 DIAGNOSIS — T7840XS Allergy, unspecified, sequela: Secondary | ICD-10-CM | POA: Diagnosis not present

## 2020-03-02 DIAGNOSIS — Z992 Dependence on renal dialysis: Secondary | ICD-10-CM | POA: Diagnosis not present

## 2020-03-02 DIAGNOSIS — E1122 Type 2 diabetes mellitus with diabetic chronic kidney disease: Secondary | ICD-10-CM | POA: Diagnosis not present

## 2020-03-02 DIAGNOSIS — N186 End stage renal disease: Secondary | ICD-10-CM | POA: Diagnosis not present

## 2020-03-02 DIAGNOSIS — T782XXD Anaphylactic shock, unspecified, subsequent encounter: Secondary | ICD-10-CM | POA: Diagnosis not present

## 2020-03-04 DIAGNOSIS — T7840XS Allergy, unspecified, sequela: Secondary | ICD-10-CM | POA: Diagnosis not present

## 2020-03-04 DIAGNOSIS — T782XXD Anaphylactic shock, unspecified, subsequent encounter: Secondary | ICD-10-CM | POA: Diagnosis not present

## 2020-03-04 DIAGNOSIS — N186 End stage renal disease: Secondary | ICD-10-CM | POA: Diagnosis not present

## 2020-03-04 DIAGNOSIS — Z992 Dependence on renal dialysis: Secondary | ICD-10-CM | POA: Diagnosis not present

## 2020-03-04 DIAGNOSIS — N2581 Secondary hyperparathyroidism of renal origin: Secondary | ICD-10-CM | POA: Diagnosis not present

## 2020-03-04 DIAGNOSIS — E1122 Type 2 diabetes mellitus with diabetic chronic kidney disease: Secondary | ICD-10-CM | POA: Diagnosis not present

## 2020-03-04 DIAGNOSIS — D509 Iron deficiency anemia, unspecified: Secondary | ICD-10-CM | POA: Diagnosis not present

## 2020-03-07 DIAGNOSIS — T782XXD Anaphylactic shock, unspecified, subsequent encounter: Secondary | ICD-10-CM | POA: Diagnosis not present

## 2020-03-07 DIAGNOSIS — Z992 Dependence on renal dialysis: Secondary | ICD-10-CM | POA: Diagnosis not present

## 2020-03-07 DIAGNOSIS — N2581 Secondary hyperparathyroidism of renal origin: Secondary | ICD-10-CM | POA: Diagnosis not present

## 2020-03-07 DIAGNOSIS — E1122 Type 2 diabetes mellitus with diabetic chronic kidney disease: Secondary | ICD-10-CM | POA: Diagnosis not present

## 2020-03-07 DIAGNOSIS — N186 End stage renal disease: Secondary | ICD-10-CM | POA: Diagnosis not present

## 2020-03-07 DIAGNOSIS — D509 Iron deficiency anemia, unspecified: Secondary | ICD-10-CM | POA: Diagnosis not present

## 2020-03-07 DIAGNOSIS — T7840XS Allergy, unspecified, sequela: Secondary | ICD-10-CM | POA: Diagnosis not present

## 2020-03-08 ENCOUNTER — Emergency Department (HOSPITAL_COMMUNITY): Payer: Medicare Other

## 2020-03-08 ENCOUNTER — Emergency Department (HOSPITAL_COMMUNITY)
Admission: EM | Admit: 2020-03-08 | Discharge: 2020-03-08 | Disposition: A | Discharge status: Other Acute Inpatient Hospital | Payer: Medicare Other | Attending: Emergency Medicine | Admitting: Emergency Medicine

## 2020-03-08 DIAGNOSIS — E1143 Type 2 diabetes mellitus with diabetic autonomic (poly)neuropathy: Secondary | ICD-10-CM | POA: Insufficient documentation

## 2020-03-08 DIAGNOSIS — E1110 Type 2 diabetes mellitus with ketoacidosis without coma: Secondary | ICD-10-CM | POA: Diagnosis not present

## 2020-03-08 DIAGNOSIS — Z7982 Long term (current) use of aspirin: Secondary | ICD-10-CM | POA: Insufficient documentation

## 2020-03-08 DIAGNOSIS — Z794 Long term (current) use of insulin: Secondary | ICD-10-CM | POA: Insufficient documentation

## 2020-03-08 DIAGNOSIS — D631 Anemia in chronic kidney disease: Secondary | ICD-10-CM | POA: Insufficient documentation

## 2020-03-08 DIAGNOSIS — R053 Chronic cough: Secondary | ICD-10-CM | POA: Diagnosis not present

## 2020-03-08 DIAGNOSIS — Z992 Dependence on renal dialysis: Secondary | ICD-10-CM | POA: Diagnosis not present

## 2020-03-08 DIAGNOSIS — Z87891 Personal history of nicotine dependence: Secondary | ICD-10-CM | POA: Diagnosis not present

## 2020-03-08 DIAGNOSIS — Z79899 Other long term (current) drug therapy: Secondary | ICD-10-CM | POA: Diagnosis not present

## 2020-03-08 DIAGNOSIS — T68XXXA Hypothermia, initial encounter: Secondary | ICD-10-CM

## 2020-03-08 DIAGNOSIS — E162 Hypoglycemia, unspecified: Secondary | ICD-10-CM

## 2020-03-08 DIAGNOSIS — N186 End stage renal disease: Secondary | ICD-10-CM | POA: Insufficient documentation

## 2020-03-08 DIAGNOSIS — I10 Essential (primary) hypertension: Secondary | ICD-10-CM | POA: Diagnosis not present

## 2020-03-08 DIAGNOSIS — Z743 Need for continuous supervision: Secondary | ICD-10-CM | POA: Diagnosis not present

## 2020-03-08 DIAGNOSIS — E1165 Type 2 diabetes mellitus with hyperglycemia: Secondary | ICD-10-CM | POA: Diagnosis not present

## 2020-03-08 DIAGNOSIS — E161 Other hypoglycemia: Secondary | ICD-10-CM | POA: Diagnosis not present

## 2020-03-08 DIAGNOSIS — X31XXXA Exposure to excessive natural cold, initial encounter: Secondary | ICD-10-CM | POA: Insufficient documentation

## 2020-03-08 DIAGNOSIS — R6889 Other general symptoms and signs: Secondary | ICD-10-CM | POA: Diagnosis not present

## 2020-03-08 DIAGNOSIS — E11649 Type 2 diabetes mellitus with hypoglycemia without coma: Secondary | ICD-10-CM | POA: Diagnosis not present

## 2020-03-08 DIAGNOSIS — E1122 Type 2 diabetes mellitus with diabetic chronic kidney disease: Secondary | ICD-10-CM | POA: Diagnosis not present

## 2020-03-08 DIAGNOSIS — R404 Transient alteration of awareness: Secondary | ICD-10-CM | POA: Diagnosis not present

## 2020-03-08 DIAGNOSIS — I120 Hypertensive chronic kidney disease with stage 5 chronic kidney disease or end stage renal disease: Secondary | ICD-10-CM | POA: Insufficient documentation

## 2020-03-08 DIAGNOSIS — R059 Cough, unspecified: Secondary | ICD-10-CM | POA: Diagnosis not present

## 2020-03-08 LAB — CBC WITH DIFFERENTIAL/PLATELET
Abs Immature Granulocytes: 0.01 10*3/uL (ref 0.00–0.07)
Basophils Absolute: 0.1 10*3/uL (ref 0.0–0.1)
Basophils Relative: 1 %
Eosinophils Absolute: 0.6 10*3/uL — ABNORMAL HIGH (ref 0.0–0.5)
Eosinophils Relative: 13 %
HCT: 39.6 % (ref 39.0–52.0)
Hemoglobin: 12.1 g/dL — ABNORMAL LOW (ref 13.0–17.0)
Immature Granulocytes: 0 %
Lymphocytes Relative: 33 %
Lymphs Abs: 1.6 10*3/uL (ref 0.7–4.0)
MCH: 27.6 pg (ref 26.0–34.0)
MCHC: 30.6 g/dL (ref 30.0–36.0)
MCV: 90.2 fL (ref 80.0–100.0)
Monocytes Absolute: 0.5 10*3/uL (ref 0.1–1.0)
Monocytes Relative: 10 %
Neutro Abs: 2.1 10*3/uL (ref 1.7–7.7)
Neutrophils Relative %: 43 %
Platelets: 86 10*3/uL — ABNORMAL LOW (ref 150–400)
RBC: 4.39 MIL/uL (ref 4.22–5.81)
RDW: 16.2 % — ABNORMAL HIGH (ref 11.5–15.5)
WBC: 4.9 10*3/uL (ref 4.0–10.5)
nRBC: 0 % (ref 0.0–0.2)

## 2020-03-08 LAB — COMPREHENSIVE METABOLIC PANEL
ALT: 10 U/L (ref 0–44)
AST: 17 U/L (ref 15–41)
Albumin: 3.5 g/dL (ref 3.5–5.0)
Alkaline Phosphatase: 68 U/L (ref 38–126)
Anion gap: 13 (ref 5–15)
BUN: 31 mg/dL — ABNORMAL HIGH (ref 6–20)
CO2: 26 mmol/L (ref 22–32)
Calcium: 9.7 mg/dL (ref 8.9–10.3)
Chloride: 96 mmol/L — ABNORMAL LOW (ref 98–111)
Creatinine, Ser: 7.32 mg/dL — ABNORMAL HIGH (ref 0.61–1.24)
GFR, Estimated: 8 mL/min — ABNORMAL LOW (ref >60)
Glucose, Bld: 73 mg/dL (ref 70–99)
Potassium: 4.3 mmol/L (ref 3.5–5.1)
Sodium: 135 mmol/L (ref 135–145)
Total Bilirubin: 0.6 mg/dL (ref 0.3–1.2)
Total Protein: 7.6 g/dL (ref 6.5–8.1)

## 2020-03-08 LAB — URINALYSIS, ROUTINE W REFLEX MICROSCOPIC
Bacteria, UA: NONE SEEN
Bilirubin Urine: NEGATIVE
Glucose, UA: 150 mg/dL — AB
Ketones, ur: NEGATIVE mg/dL
Leukocytes,Ua: NEGATIVE
Nitrite: NEGATIVE
Protein, ur: 100 mg/dL — AB
Specific Gravity, Urine: 1.006 (ref 1.005–1.030)
pH: 9 — ABNORMAL HIGH (ref 5.0–8.0)

## 2020-03-08 LAB — CBG MONITORING, ED
Glucose-Capillary: 68 mg/dL — ABNORMAL LOW (ref 70–99)
Glucose-Capillary: 111 mg/dL — ABNORMAL HIGH (ref 70–99)
Glucose-Capillary: 158 mg/dL — ABNORMAL HIGH (ref 70–99)

## 2020-03-08 LAB — LACTIC ACID, PLASMA: Lactic Acid, Venous: 1.9 mmol/L (ref 0.5–1.9)

## 2020-03-08 IMAGING — DX DG CHEST 1V PORT
1 series · 1 of 1 positions shown · non-contrast
Comparison: [DATE].

CLINICAL DATA: Hypoglycemia, chronic cough.

EXAM:
PORTABLE CHEST 1 VIEW

[chest]
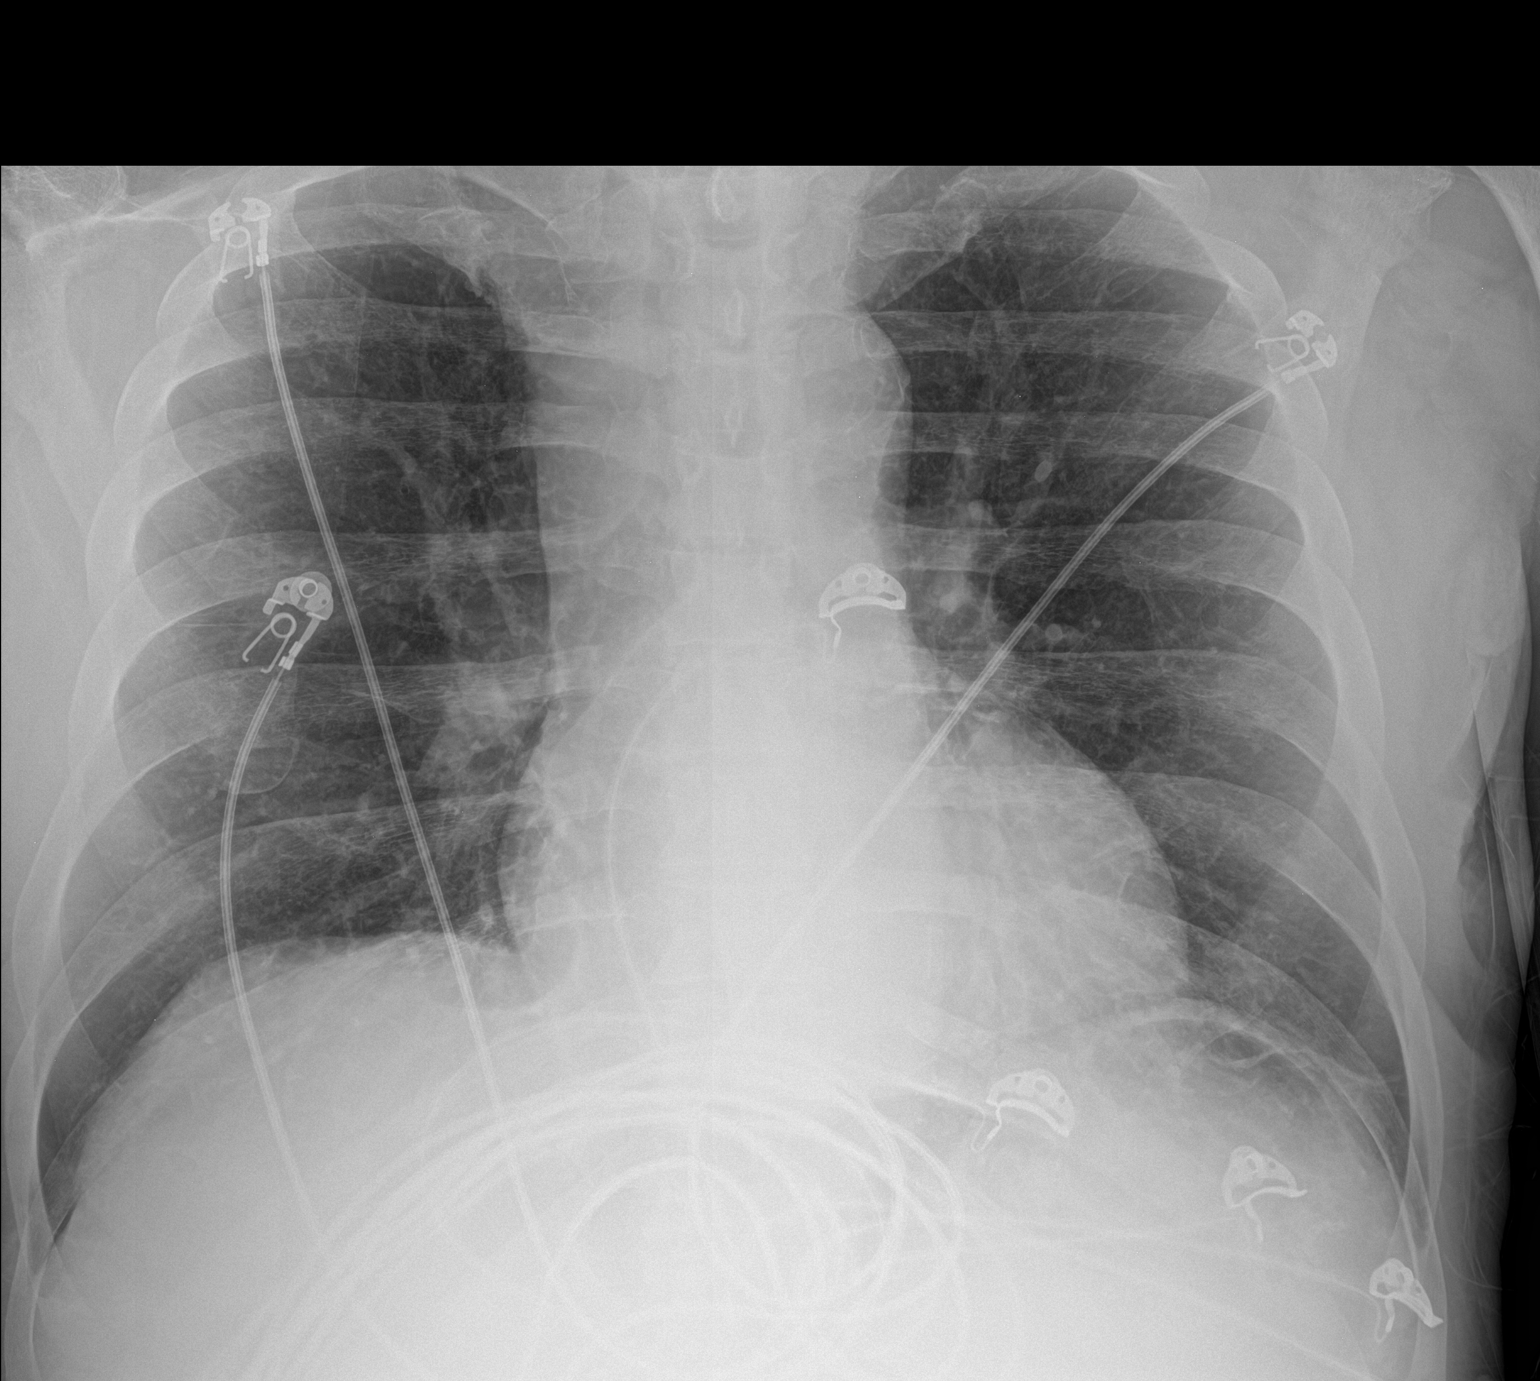

[1 of 1 positions shown; findings below may reference images not displayed]

FINDINGS: Similar mild enlargement of the cardiac silhouette. No
consolidation. No visible pleural effusions or pneumothorax. No
acute osseous abnormality.
IMPRESSION: No acute cardiopulmonary disease.

## 2020-03-08 NOTE — ED Notes (Signed)
Report attempted to [facility].  No answer when called.  DC instructions reviewed with the pt.  PT verbalized understanding.  PT DC with family back to [facility].

## 2020-03-08 NOTE — Discharge Instructions (Signed)
You have a blood and urine cultures pending. Please schedule follow-up with your primary care doctor.  While in the emergency room your blood pressure was elevated.  Please follow-up with your primary care doctor.  If your symptoms return, you develop fevers or have any new concerns or complaints please seek additional medical care and evaluation.

## 2020-03-08 NOTE — ED Notes (Signed)
PT stating he feels anxious and needs some air. Pt placed on 2L 02 for comfort. PT also needed to void so he was given a urinal.

## 2020-03-08 NOTE — ED Notes (Signed)
Put bearhugger on pt

## 2020-03-08 NOTE — ED Triage Notes (Signed)
PT BIBA from [facility] for hypoglycemia and AMS. Initall GCS 10. Initial CBG 70. Pt rec'd a tab of Glucagon at SNF. EMS glucose was 89 after Glucagon administered at SNF. Pt also drank some pineapple juice with 24g of oral glucose. Current GCS 15.
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
## 2020-03-09 DIAGNOSIS — D509 Iron deficiency anemia, unspecified: Secondary | ICD-10-CM | POA: Diagnosis not present

## 2020-03-09 DIAGNOSIS — N186 End stage renal disease: Secondary | ICD-10-CM | POA: Diagnosis not present

## 2020-03-09 DIAGNOSIS — Z992 Dependence on renal dialysis: Secondary | ICD-10-CM | POA: Diagnosis not present

## 2020-03-09 DIAGNOSIS — N2581 Secondary hyperparathyroidism of renal origin: Secondary | ICD-10-CM | POA: Diagnosis not present

## 2020-03-10 LAB — URINE CULTURE: Culture: <10000 — AB

## 2020-03-11 DIAGNOSIS — Z79899 Other long term (current) drug therapy: Secondary | ICD-10-CM | POA: Diagnosis not present

## 2020-03-11 DIAGNOSIS — N2581 Secondary hyperparathyroidism of renal origin: Secondary | ICD-10-CM | POA: Diagnosis not present

## 2020-03-11 DIAGNOSIS — Z89519 Acquired absence of unspecified leg below knee: Secondary | ICD-10-CM | POA: Diagnosis not present

## 2020-03-11 DIAGNOSIS — D509 Iron deficiency anemia, unspecified: Secondary | ICD-10-CM | POA: Diagnosis not present

## 2020-03-11 DIAGNOSIS — N186 End stage renal disease: Secondary | ICD-10-CM | POA: Diagnosis not present

## 2020-03-11 DIAGNOSIS — Z992 Dependence on renal dialysis: Secondary | ICD-10-CM | POA: Diagnosis not present

## 2020-03-11 DIAGNOSIS — E1122 Type 2 diabetes mellitus with diabetic chronic kidney disease: Secondary | ICD-10-CM | POA: Diagnosis not present

## 2020-03-13 DIAGNOSIS — I120 Hypertensive chronic kidney disease with stage 5 chronic kidney disease or end stage renal disease: Secondary | ICD-10-CM | POA: Diagnosis not present

## 2020-03-13 DIAGNOSIS — Z89511 Acquired absence of right leg below knee: Secondary | ICD-10-CM | POA: Diagnosis not present

## 2020-03-13 DIAGNOSIS — Z89512 Acquired absence of left leg below knee: Secondary | ICD-10-CM | POA: Diagnosis not present

## 2020-03-13 LAB — CULTURE, BLOOD (ROUTINE X 2)
Culture: NO GROWTH
Special Requests: ADEQUATE

## 2020-03-14 DIAGNOSIS — N2581 Secondary hyperparathyroidism of renal origin: Secondary | ICD-10-CM | POA: Diagnosis not present

## 2020-03-14 DIAGNOSIS — Z992 Dependence on renal dialysis: Secondary | ICD-10-CM | POA: Diagnosis not present

## 2020-03-14 DIAGNOSIS — D631 Anemia in chronic kidney disease: Secondary | ICD-10-CM | POA: Diagnosis not present

## 2020-03-14 DIAGNOSIS — N186 End stage renal disease: Secondary | ICD-10-CM | POA: Diagnosis not present

## 2020-03-14 DIAGNOSIS — T782XXD Anaphylactic shock, unspecified, subsequent encounter: Secondary | ICD-10-CM | POA: Diagnosis not present

## 2020-03-15 ENCOUNTER — Other Ambulatory Visit: Payer: Self-pay

## 2020-03-15 ENCOUNTER — Encounter (INDEPENDENT_AMBULATORY_CARE_PROVIDER_SITE_OTHER): Payer: Medicare Other | Admitting: Ophthalmology

## 2020-03-15 DIAGNOSIS — H43813 Vitreous degeneration, bilateral: Secondary | ICD-10-CM

## 2020-03-15 DIAGNOSIS — H35033 Hypertensive retinopathy, bilateral: Secondary | ICD-10-CM | POA: Diagnosis not present

## 2020-03-15 DIAGNOSIS — I10 Essential (primary) hypertension: Secondary | ICD-10-CM | POA: Diagnosis not present

## 2020-03-15 DIAGNOSIS — E113513 Type 2 diabetes mellitus with proliferative diabetic retinopathy with macular edema, bilateral: Secondary | ICD-10-CM | POA: Diagnosis not present

## 2020-03-16 ENCOUNTER — Encounter (INDEPENDENT_AMBULATORY_CARE_PROVIDER_SITE_OTHER): Payer: Medicare Other | Admitting: Ophthalmology

## 2020-03-16 DIAGNOSIS — D631 Anemia in chronic kidney disease: Secondary | ICD-10-CM | POA: Diagnosis not present

## 2020-03-16 DIAGNOSIS — N2581 Secondary hyperparathyroidism of renal origin: Secondary | ICD-10-CM | POA: Diagnosis not present

## 2020-03-16 DIAGNOSIS — Z992 Dependence on renal dialysis: Secondary | ICD-10-CM | POA: Diagnosis not present

## 2020-03-16 DIAGNOSIS — T782XXD Anaphylactic shock, unspecified, subsequent encounter: Secondary | ICD-10-CM | POA: Diagnosis not present

## 2020-03-16 DIAGNOSIS — N186 End stage renal disease: Secondary | ICD-10-CM | POA: Diagnosis not present

## 2020-03-18 DIAGNOSIS — N186 End stage renal disease: Secondary | ICD-10-CM | POA: Diagnosis not present

## 2020-03-18 DIAGNOSIS — N2581 Secondary hyperparathyroidism of renal origin: Secondary | ICD-10-CM | POA: Diagnosis not present

## 2020-03-18 DIAGNOSIS — Z992 Dependence on renal dialysis: Secondary | ICD-10-CM | POA: Diagnosis not present

## 2020-03-18 DIAGNOSIS — T782XXD Anaphylactic shock, unspecified, subsequent encounter: Secondary | ICD-10-CM | POA: Diagnosis not present

## 2020-03-18 DIAGNOSIS — D631 Anemia in chronic kidney disease: Secondary | ICD-10-CM | POA: Diagnosis not present

## 2020-03-21 DIAGNOSIS — D509 Iron deficiency anemia, unspecified: Secondary | ICD-10-CM | POA: Diagnosis not present

## 2020-03-21 DIAGNOSIS — N2581 Secondary hyperparathyroidism of renal origin: Secondary | ICD-10-CM | POA: Diagnosis not present

## 2020-03-21 DIAGNOSIS — N186 End stage renal disease: Secondary | ICD-10-CM | POA: Diagnosis not present

## 2020-03-21 DIAGNOSIS — Z992 Dependence on renal dialysis: Secondary | ICD-10-CM | POA: Diagnosis not present

## 2020-03-23 DIAGNOSIS — D509 Iron deficiency anemia, unspecified: Secondary | ICD-10-CM | POA: Diagnosis not present

## 2020-03-23 DIAGNOSIS — N2581 Secondary hyperparathyroidism of renal origin: Secondary | ICD-10-CM | POA: Diagnosis not present

## 2020-03-23 DIAGNOSIS — Z992 Dependence on renal dialysis: Secondary | ICD-10-CM | POA: Diagnosis not present

## 2020-03-23 DIAGNOSIS — N186 End stage renal disease: Secondary | ICD-10-CM | POA: Diagnosis not present

## 2020-03-25 DIAGNOSIS — N2581 Secondary hyperparathyroidism of renal origin: Secondary | ICD-10-CM | POA: Diagnosis not present

## 2020-03-25 DIAGNOSIS — D509 Iron deficiency anemia, unspecified: Secondary | ICD-10-CM | POA: Diagnosis not present

## 2020-03-25 DIAGNOSIS — Z89519 Acquired absence of unspecified leg below knee: Secondary | ICD-10-CM | POA: Diagnosis not present

## 2020-03-25 DIAGNOSIS — Z79899 Other long term (current) drug therapy: Secondary | ICD-10-CM | POA: Diagnosis not present

## 2020-03-25 DIAGNOSIS — Z992 Dependence on renal dialysis: Secondary | ICD-10-CM | POA: Diagnosis not present

## 2020-03-25 DIAGNOSIS — N186 End stage renal disease: Secondary | ICD-10-CM | POA: Diagnosis not present

## 2020-03-28 DIAGNOSIS — N186 End stage renal disease: Secondary | ICD-10-CM | POA: Diagnosis not present

## 2020-03-28 DIAGNOSIS — N2581 Secondary hyperparathyroidism of renal origin: Secondary | ICD-10-CM | POA: Diagnosis not present

## 2020-03-28 DIAGNOSIS — Z992 Dependence on renal dialysis: Secondary | ICD-10-CM | POA: Diagnosis not present

## 2020-03-28 DIAGNOSIS — D509 Iron deficiency anemia, unspecified: Secondary | ICD-10-CM | POA: Diagnosis not present

## 2020-03-29 ENCOUNTER — Encounter (INDEPENDENT_AMBULATORY_CARE_PROVIDER_SITE_OTHER): Payer: Medicaid Other | Admitting: Ophthalmology

## 2020-03-29 ENCOUNTER — Other Ambulatory Visit: Payer: Self-pay

## 2020-03-29 DIAGNOSIS — E113511 Type 2 diabetes mellitus with proliferative diabetic retinopathy with macular edema, right eye: Secondary | ICD-10-CM | POA: Diagnosis not present

## 2020-03-29 DIAGNOSIS — K3184 Gastroparesis: Secondary | ICD-10-CM | POA: Diagnosis not present

## 2020-03-29 DIAGNOSIS — G629 Polyneuropathy, unspecified: Secondary | ICD-10-CM | POA: Diagnosis not present

## 2020-03-29 DIAGNOSIS — N186 End stage renal disease: Secondary | ICD-10-CM | POA: Diagnosis not present

## 2020-03-30 DIAGNOSIS — N186 End stage renal disease: Secondary | ICD-10-CM | POA: Diagnosis not present

## 2020-03-30 DIAGNOSIS — D509 Iron deficiency anemia, unspecified: Secondary | ICD-10-CM | POA: Diagnosis not present

## 2020-03-30 DIAGNOSIS — Z992 Dependence on renal dialysis: Secondary | ICD-10-CM | POA: Diagnosis not present

## 2020-03-30 DIAGNOSIS — N2581 Secondary hyperparathyroidism of renal origin: Secondary | ICD-10-CM | POA: Diagnosis not present

## 2020-04-01 DIAGNOSIS — N2581 Secondary hyperparathyroidism of renal origin: Secondary | ICD-10-CM | POA: Diagnosis not present

## 2020-04-01 DIAGNOSIS — D509 Iron deficiency anemia, unspecified: Secondary | ICD-10-CM | POA: Diagnosis not present

## 2020-04-01 DIAGNOSIS — Z992 Dependence on renal dialysis: Secondary | ICD-10-CM | POA: Diagnosis not present

## 2020-04-01 DIAGNOSIS — N186 End stage renal disease: Secondary | ICD-10-CM | POA: Diagnosis not present

## 2020-04-04 DIAGNOSIS — N2581 Secondary hyperparathyroidism of renal origin: Secondary | ICD-10-CM | POA: Diagnosis not present

## 2020-04-04 DIAGNOSIS — N186 End stage renal disease: Secondary | ICD-10-CM | POA: Diagnosis not present

## 2020-04-04 DIAGNOSIS — Z992 Dependence on renal dialysis: Secondary | ICD-10-CM | POA: Diagnosis not present

## 2020-04-06 DIAGNOSIS — N2581 Secondary hyperparathyroidism of renal origin: Secondary | ICD-10-CM | POA: Diagnosis not present

## 2020-04-06 DIAGNOSIS — Z992 Dependence on renal dialysis: Secondary | ICD-10-CM | POA: Diagnosis not present

## 2020-04-06 DIAGNOSIS — N186 End stage renal disease: Secondary | ICD-10-CM | POA: Diagnosis not present

## 2020-04-07 DIAGNOSIS — E1122 Type 2 diabetes mellitus with diabetic chronic kidney disease: Secondary | ICD-10-CM | POA: Diagnosis not present

## 2020-04-07 DIAGNOSIS — N186 End stage renal disease: Secondary | ICD-10-CM | POA: Diagnosis not present

## 2020-04-07 DIAGNOSIS — Z992 Dependence on renal dialysis: Secondary | ICD-10-CM | POA: Diagnosis not present

## 2020-04-12 ENCOUNTER — Encounter (INDEPENDENT_AMBULATORY_CARE_PROVIDER_SITE_OTHER): Payer: Medicare (Managed Care) | Admitting: Ophthalmology

## 2020-04-12 ENCOUNTER — Other Ambulatory Visit: Payer: Self-pay

## 2020-04-12 DIAGNOSIS — I10 Essential (primary) hypertension: Secondary | ICD-10-CM

## 2020-04-12 DIAGNOSIS — E113513 Type 2 diabetes mellitus with proliferative diabetic retinopathy with macular edema, bilateral: Secondary | ICD-10-CM | POA: Diagnosis not present

## 2020-04-12 DIAGNOSIS — H35033 Hypertensive retinopathy, bilateral: Secondary | ICD-10-CM | POA: Diagnosis not present

## 2020-04-12 DIAGNOSIS — H43813 Vitreous degeneration, bilateral: Secondary | ICD-10-CM | POA: Diagnosis not present

## 2020-04-14 ENCOUNTER — Other Ambulatory Visit: Payer: Self-pay

## 2020-04-14 DIAGNOSIS — N186 End stage renal disease: Secondary | ICD-10-CM

## 2020-04-14 DIAGNOSIS — Z992 Dependence on renal dialysis: Secondary | ICD-10-CM

## 2020-05-03 ENCOUNTER — Ambulatory Visit (HOSPITAL_COMMUNITY)
Admission: RE | Admit: 2020-05-03 | Discharge: 2020-05-03 | Disposition: A | Discharge status: Home / Self Care | Payer: Medicare (Managed Care) | Source: Ambulatory Visit | Attending: Vascular Surgery | Admitting: Vascular Surgery

## 2020-05-03 ENCOUNTER — Other Ambulatory Visit: Payer: Self-pay

## 2020-05-03 ENCOUNTER — Ambulatory Visit (INDEPENDENT_AMBULATORY_CARE_PROVIDER_SITE_OTHER): Payer: Medicare (Managed Care) | Admitting: Physician Assistant

## 2020-05-03 VITALS — BP 205/87 | HR 69 | Temp 98.6°F | Resp 20 | Ht 70.0 in | Wt 150.6 lb

## 2020-05-03 DIAGNOSIS — N186 End stage renal disease: Secondary | ICD-10-CM

## 2020-05-03 DIAGNOSIS — Z992 Dependence on renal dialysis: Secondary | ICD-10-CM | POA: Diagnosis present

## 2020-05-03 NOTE — Progress Notes (Signed)
Established Dialysis Access   History of Present Illness   Jorge [name] is a 50 y.o. ([birth year]) male who presents for evaluation of steal in right upper extremity AV fistula.  He explains that he has had numbness and weakness in the right arm and hand since the fistula was placed on 08/29/19 by Dr. Donnetta Hutching. He feels that however it is getting somewhat worse. He denies any pain in the right hand or arm. He says he really just has to focus a lot when he is picking something up to really hold onto it or he will drop it. He also notes coldness in his hands but it is about the same in both the right and left hand.  He otherwise says fistula has been functioning well. He did have procedure on the fistula in November at [facility] to help " open it up". Since then it has been working great.  Current Outpatient Medications  Medication Sig Dispense Refill  . aspirin EC 81 MG tablet Take 81 mg by mouth daily.    Marland Kitchen atorvastatin (LIPITOR) 20 MG tablet TAKE 1 TABLET(20 MG) BY MOUTH DAILY (Patient taking differently: Take 20 mg by mouth daily.) 90 tablet 1  . carvedilol (COREG) 12.5 MG tablet Take 12.5 mg by mouth 2 (two) times daily.    . cephALEXin (KEFLEX) 500 MG capsule Take 500 mg by mouth 3 (three) times daily.    . Darbepoetin Alfa (ARANESP) 200 MCG/0.4ML SOSY injection Inject 0.4 mLs (200 mcg total) into the skin every Thursday at 6pm. 1.68 mL   . furosemide (LASIX) 20 MG tablet Take 20 mg by mouth daily.    Marland Kitchen gabapentin (NEURONTIN) 300 MG capsule Take 300 mg by mouth at bedtime as needed.    . Insulin Pen Needle (PEN NEEDLES) 30G X 8 MM MISC 1 each by Does not apply route in the morning, at noon, in the evening, and at bedtime. 100 each 0  . iron sucrose in sodium chloride 0.9 % 100 mL Iron Sucrose (Venofer)    . LACTOBACILLUS ACID-PECTIN PO Take 1 capsule by mouth in the morning and at bedtime. 5 week course    . Loperamide HCl 1 MG/7.5ML LIQD Take 10 mLs by mouth every 6 (six) hours as needed  for diarrhea or loose stools.    . Methoxy PEG-Epoetin Beta (MIRCERA IJ) Mircera    . metoCLOPramide (REGLAN) 10 MG tablet Take 1 tablet (10 mg total) by mouth every 6 (six) hours as needed for nausea or vomiting.    . Multiple Vitamins-Minerals (PRORENAL + D) TABS Take 1 tablet by mouth daily.    . ondansetron (ZOFRAN ODT) 4 MG disintegrating tablet Take 1 tablet (4 mg total) by mouth every 8 (eight) hours as needed for vomiting (When you are actively vomiting and unable to tolerate Reglan). 20 tablet 0  . pantoprazole (PROTONIX) 40 MG tablet Take 1 tablet (40 mg total) by mouth at bedtime. 30 tablet 0  . promethazine (PHENERGAN) 25 MG tablet TAKE 1 TABLET (25 MG TOTAL) BY MOUTH EVERY SIX HOURS AS NEEDED FOR NAUSEA OR VOMITING. 30 tablet 0  . sevelamer carbonate (RENVELA) 800 MG tablet Take 2 tablets (1,600 mg total) by mouth 3 (three) times daily with meals. 180 tablet 0  . sulfamethoxazole-trimethoprim (BACTRIM DS) 800-160 MG tablet Take 1 tablet by mouth 2 (two) times daily.    . Vitamin D, Ergocalciferol, (DRISDOL) 1.25 MG (50000 UNIT) CAPS capsule Take 50,000 Units by mouth once a  week. saturdays    . insulin glargine (LANTUS SOLOSTAR) 100 UNIT/ML Solostar Pen Inject 5 Units into the skin daily. (Patient taking differently: Inject 5 Units into the skin at bedtime.) 15 mL 0  . ondansetron (ZOFRAN) 4 MG tablet Take by mouth.    . sertraline (ZOLOFT) 25 MG tablet Take 25 mg by mouth daily.     No current facility-administered medications for this visit.    On ROS today: negative unless stated in HPI   Physical Examination   Vitals:   05/03/20 1451  BP: (!) 205/87  Pulse: 69  Resp: 20  Temp: 98.6 F (37 C)  TempSrc: Temporal  SpO2: 96%  Weight: 150 lb 9.2 oz (68.3 kg)  Height: '5\' 10"'$  (1.778 m)   Body mass index is 21.61 kg/m.  General WDWN, not in any distress  Pulmonary Clear to auscultation bilaterally, non labored  Cardiac Regular rate and rhythm  Vascular Vessel Right  Left  Radial Palpable Palpable  Brachial Palpable Palpable  Ulnar Not palpable Not palpable    Musculo- skeletal M/S 5/5 throughout  , Extremities without ischemic changes    Right AV fistula with good thrill. Audible bruit. Bilateral hands cool. Motor and sensation intact  Neurologic A&O; CN grossly intact     Non-invasive Vascular Imaging    VAS [place] STEAL Findings:    +---------------------------+--------+--------+----------------+                Right  Left  Comments      +---------------------------+--------+--------+----------------+  Brachial              216 mmHg          +---------------------------+--------+--------+----------------+  Radial Ambient           255 mmHgNon compressible  +---------------------------+--------+--------+----------------+  Radial AV Compression                     +---------------------------+--------+--------+----------------+  Ulnar Ambient           255 mmHgNon compressible  +---------------------------+--------+--------+----------------+  Ulnar AV Compression                      +---------------------------+--------+--------+----------------+  2nd Digit Ambient     102 mmHg189 mmHg          +---------------------------+--------+--------+----------------+  2nd Digit Ulnar Compression194 mmHg              +---------------------------+--------+--------+----------------+     Summary:  Right digital pressures increases from 102 mm Hg to 194 mm Hg with AVF compression. Resting left second digit ambient pressure is 189 mm Hg. Findings are consistent with right arm AVF steal.     Medical Decision Making   Jorge [name] is a 50 y.o. male who presents with Right AV fistula with concerns of steal. He certainly has some steal symptoms in the right upper arm with numbness and  weakness of the right hand. He has a palpable radial pulse. Motor and sensation is intact. His duplex today indicates right arm AVF steal. I discussed with him that by nature of a fistula they do steal the blood flow, but that sometimes the symptoms of this are significant enough that the fistula needs to be ligated. We discussed that he would need to have evaluation for new access with ligation of his fistula and possible a TDC in the interim. Likely he would have to have new access in his left arm. He feels that his current fistula is working really well and  his symptoms are tolerable at this time. Advised him to call if he changes his mind and/or his symptoms become unbearable  Karoline Caldwell, PA-C [facility] Office: [facility] MD: Stanford Breed Carlis Abbott

## 2020-05-09 ENCOUNTER — Encounter (HOSPITAL_COMMUNITY): Payer: Self-pay

## 2020-05-09 ENCOUNTER — Observation Stay (HOSPITAL_COMMUNITY)
Admission: EM | Admit: 2020-05-09 | Discharge: 2020-05-11 | Disposition: A | Discharge status: Home / Self Care | Payer: Medicare Other | Attending: Family Medicine | Admitting: Family Medicine

## 2020-05-09 ENCOUNTER — Emergency Department (HOSPITAL_COMMUNITY): Payer: Medicare Other

## 2020-05-09 ENCOUNTER — Other Ambulatory Visit: Payer: Self-pay

## 2020-05-09 ENCOUNTER — Observation Stay (HOSPITAL_COMMUNITY): Payer: Medicare Other

## 2020-05-09 DIAGNOSIS — Z992 Dependence on renal dialysis: Secondary | ICD-10-CM | POA: Insufficient documentation

## 2020-05-09 DIAGNOSIS — Z743 Need for continuous supervision: Secondary | ICD-10-CM | POA: Diagnosis not present

## 2020-05-09 DIAGNOSIS — Z8673 Personal history of transient ischemic attack (TIA), and cerebral infarction without residual deficits: Secondary | ICD-10-CM | POA: Insufficient documentation

## 2020-05-09 DIAGNOSIS — E1122 Type 2 diabetes mellitus with diabetic chronic kidney disease: Secondary | ICD-10-CM | POA: Diagnosis not present

## 2020-05-09 DIAGNOSIS — N186 End stage renal disease: Secondary | ICD-10-CM | POA: Diagnosis not present

## 2020-05-09 DIAGNOSIS — R401 Stupor: Secondary | ICD-10-CM | POA: Diagnosis not present

## 2020-05-09 DIAGNOSIS — E1129 Type 2 diabetes mellitus with other diabetic kidney complication: Secondary | ICD-10-CM | POA: Diagnosis not present

## 2020-05-09 DIAGNOSIS — R4020 Unspecified coma: Secondary | ICD-10-CM | POA: Diagnosis not present

## 2020-05-09 DIAGNOSIS — Z7982 Long term (current) use of aspirin: Secondary | ICD-10-CM | POA: Diagnosis not present

## 2020-05-09 DIAGNOSIS — G459 Transient cerebral ischemic attack, unspecified: Secondary | ICD-10-CM | POA: Diagnosis not present

## 2020-05-09 DIAGNOSIS — R6889 Other general symptoms and signs: Secondary | ICD-10-CM | POA: Diagnosis not present

## 2020-05-09 DIAGNOSIS — I120 Hypertensive chronic kidney disease with stage 5 chronic kidney disease or end stage renal disease: Secondary | ICD-10-CM | POA: Insufficient documentation

## 2020-05-09 DIAGNOSIS — Z794 Long term (current) use of insulin: Secondary | ICD-10-CM | POA: Insufficient documentation

## 2020-05-09 DIAGNOSIS — D631 Anemia in chronic kidney disease: Secondary | ICD-10-CM | POA: Diagnosis not present

## 2020-05-09 DIAGNOSIS — R4182 Altered mental status, unspecified: Secondary | ICD-10-CM | POA: Diagnosis present

## 2020-05-09 DIAGNOSIS — Y900 Blood alcohol level of less than 20 mg/100 ml: Secondary | ICD-10-CM | POA: Diagnosis not present

## 2020-05-09 DIAGNOSIS — R29818 Other symptoms and signs involving the nervous system: Secondary | ICD-10-CM | POA: Diagnosis not present

## 2020-05-09 DIAGNOSIS — E785 Hyperlipidemia, unspecified: Secondary | ICD-10-CM

## 2020-05-09 DIAGNOSIS — Z87891 Personal history of nicotine dependence: Secondary | ICD-10-CM | POA: Diagnosis not present

## 2020-05-09 DIAGNOSIS — E1169 Type 2 diabetes mellitus with other specified complication: Secondary | ICD-10-CM

## 2020-05-09 DIAGNOSIS — Z20822 Contact with and (suspected) exposure to covid-19: Secondary | ICD-10-CM | POA: Diagnosis not present

## 2020-05-09 DIAGNOSIS — E1159 Type 2 diabetes mellitus with other circulatory complications: Secondary | ICD-10-CM

## 2020-05-09 DIAGNOSIS — R2689 Other abnormalities of gait and mobility: Secondary | ICD-10-CM | POA: Diagnosis not present

## 2020-05-09 DIAGNOSIS — Z79899 Other long term (current) drug therapy: Secondary | ICD-10-CM | POA: Insufficient documentation

## 2020-05-09 DIAGNOSIS — I152 Hypertension secondary to endocrine disorders: Secondary | ICD-10-CM | POA: Diagnosis present

## 2020-05-09 HISTORY — DX: Unspecified fall, initial encounter: W19.XXXA

## 2020-05-09 HISTORY — DX: Enterocolitis due to Clostridium difficile, not specified as recurrent: A04.72

## 2020-05-09 HISTORY — DX: Anemia, unspecified: D64.9

## 2020-05-09 LAB — CBC
HCT: 33 % — ABNORMAL LOW (ref 39.0–52.0)
HCT: 32.3 % — ABNORMAL LOW (ref 39.0–52.0)
Hemoglobin: 10.2 g/dL — ABNORMAL LOW (ref 13.0–17.0)
Hemoglobin: 10.1 g/dL — ABNORMAL LOW (ref 13.0–17.0)
MCH: 27.9 pg (ref 26.0–34.0)
MCH: 28.1 pg (ref 26.0–34.0)
MCHC: 30.9 g/dL (ref 30.0–36.0)
MCHC: 31.3 g/dL (ref 30.0–36.0)
MCV: 90.4 fL (ref 80.0–100.0)
MCV: 90 fL (ref 80.0–100.0)
Platelets: 82 10*3/uL — ABNORMAL LOW (ref 150–400)
Platelets: 89 10*3/uL — ABNORMAL LOW (ref 150–400)
RBC: 3.65 MIL/uL — ABNORMAL LOW (ref 4.22–5.81)
RBC: 3.59 MIL/uL — ABNORMAL LOW (ref 4.22–5.81)
RDW: 17.6 % — ABNORMAL HIGH (ref 11.5–15.5)
RDW: 17.6 % — ABNORMAL HIGH (ref 11.5–15.5)
WBC: 5.2 10*3/uL (ref 4.0–10.5)
WBC: 5.5 10*3/uL (ref 4.0–10.5)
nRBC: 0 % (ref 0.0–0.2)
nRBC: 0 % (ref 0.0–0.2)

## 2020-05-09 LAB — DIFFERENTIAL
Abs Immature Granulocytes: 0.01 10*3/uL (ref 0.00–0.07)
Basophils Absolute: 0 10*3/uL (ref 0.0–0.1)
Basophils Relative: 1 %
Eosinophils Absolute: 0.4 10*3/uL (ref 0.0–0.5)
Eosinophils Relative: 7 %
Immature Granulocytes: 0 %
Lymphocytes Relative: 40 %
Lymphs Abs: 2.2 10*3/uL (ref 0.7–4.0)
Monocytes Absolute: 0.7 10*3/uL (ref 0.1–1.0)
Monocytes Relative: 14 %
Neutro Abs: 2.1 10*3/uL (ref 1.7–7.7)
Neutrophils Relative %: 38 %

## 2020-05-09 LAB — PROTIME-INR
INR: 1.1 (ref 0.8–1.2)
Prothrombin Time: 14.1 seconds (ref 11.4–15.2)

## 2020-05-09 LAB — I-STAT CHEM 8, ED
BUN: 36 mg/dL — ABNORMAL HIGH (ref 6–20)
Calcium, Ion: 0.98 mmol/L — ABNORMAL LOW (ref 1.15–1.40)
Chloride: 99 mmol/L (ref 98–111)
Creatinine, Ser: 8.6 mg/dL — ABNORMAL HIGH (ref 0.61–1.24)
Glucose, Bld: 147 mg/dL — ABNORMAL HIGH (ref 70–99)
HCT: 32 % — ABNORMAL LOW (ref 39.0–52.0)
Hemoglobin: 10.9 g/dL — ABNORMAL LOW (ref 13.0–17.0)
Potassium: 4.6 mmol/L (ref 3.5–5.1)
Sodium: 135 mmol/L (ref 135–145)
TCO2: 26 mmol/L (ref 22–32)

## 2020-05-09 LAB — CBG MONITORING, ED: Glucose-Capillary: 146 mg/dL — ABNORMAL HIGH (ref 70–99)

## 2020-05-09 LAB — COMPREHENSIVE METABOLIC PANEL
ALT: 11 U/L (ref 0–44)
AST: 14 U/L — ABNORMAL LOW (ref 15–41)
Albumin: 3.4 g/dL — ABNORMAL LOW (ref 3.5–5.0)
Alkaline Phosphatase: 96 U/L (ref 38–126)
Anion gap: 12 (ref 5–15)
BUN: 34 mg/dL — ABNORMAL HIGH (ref 6–20)
CO2: 25 mmol/L (ref 22–32)
Calcium: 8.4 mg/dL — ABNORMAL LOW (ref 8.9–10.3)
Chloride: 97 mmol/L — ABNORMAL LOW (ref 98–111)
Creatinine, Ser: 7.7 mg/dL — ABNORMAL HIGH (ref 0.61–1.24)
GFR, Estimated: 8 mL/min — ABNORMAL LOW (ref >60)
Glucose, Bld: 148 mg/dL — ABNORMAL HIGH (ref 70–99)
Potassium: 4.6 mmol/L (ref 3.5–5.1)
Sodium: 134 mmol/L — ABNORMAL LOW (ref 135–145)
Total Bilirubin: 0.5 mg/dL (ref 0.3–1.2)
Total Protein: 7.2 g/dL (ref 6.5–8.1)

## 2020-05-09 LAB — RESP PANEL BY RT-PCR (FLU A&B, COVID) ARPGX2
Influenza A by PCR: NEGATIVE
Influenza B by PCR: NEGATIVE
SARS Coronavirus 2 by RT PCR: NEGATIVE

## 2020-05-09 LAB — APTT: aPTT: 33 seconds (ref 24–36)

## 2020-05-09 LAB — ETHANOL: Alcohol, Ethyl (B): <10 mg/dL (ref <10)

## 2020-05-09 IMAGING — MR MR MRA HEAD W/O CM
2 series · 19 of 48 positions shown · non-contrast
Comparison: Head CT [DATE]

CLINICAL DATA: Transient ischemic attack

EXAM:
MRI HEAD WITHOUT CONTRAST
MRA HEAD WITHOUT CONTRAST
TECHNIQUE: Multiplanar, multiecho pulse sequences of the brain and surrounding
structures were obtained without intravenous contrast. Angiographic
images of the head were obtained using MRA technique without
contrast.

[Series 15: 3d cow · axial · 0.8mm · 0.41mm/px · z∈[-117,-25]mm · 18 of 130 slices shown]
[im 1/130]
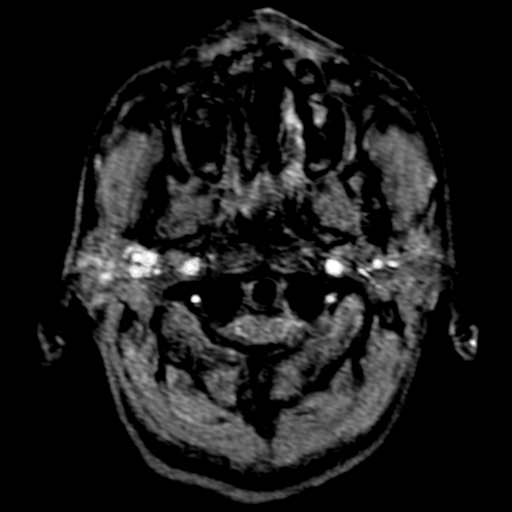
[im 3/130]
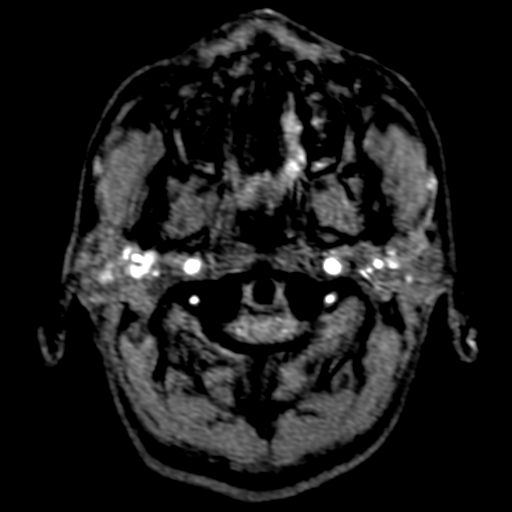
[im 6/130]
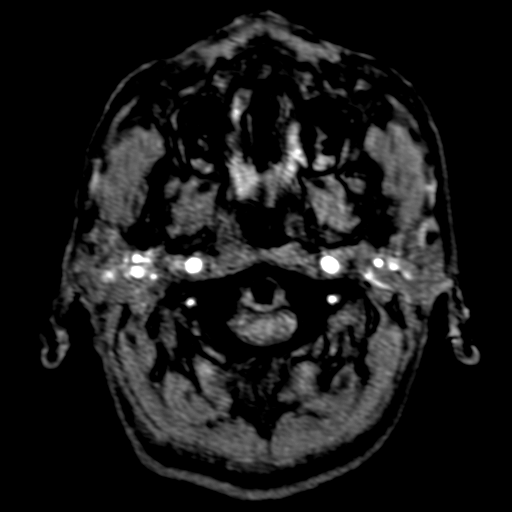
[im 9/130]
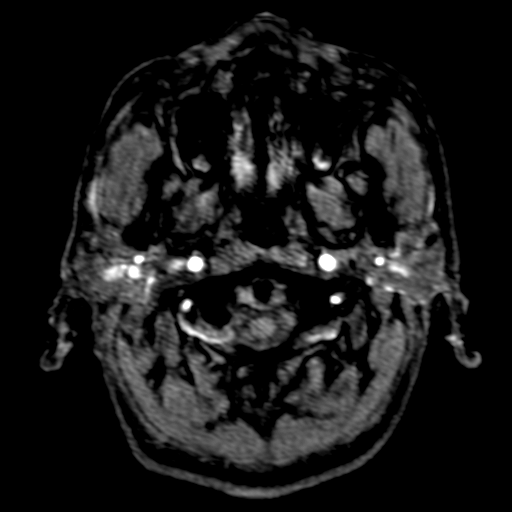
[im 12/130]
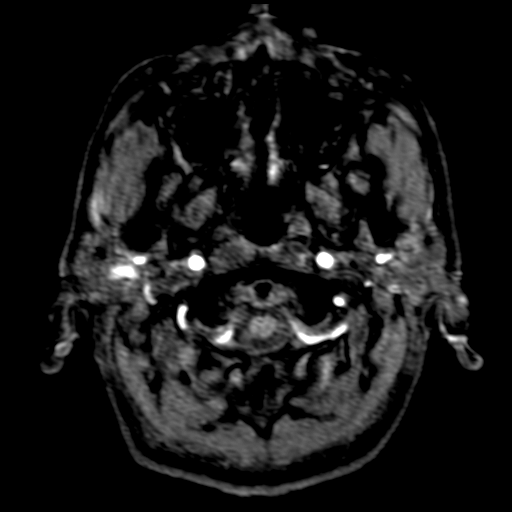
[im 15/130]
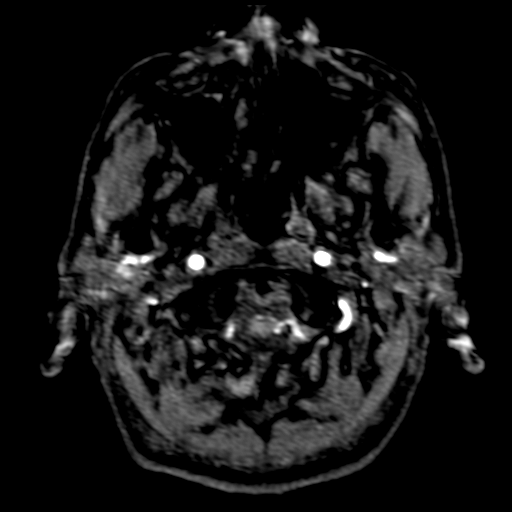
[im 17/130]
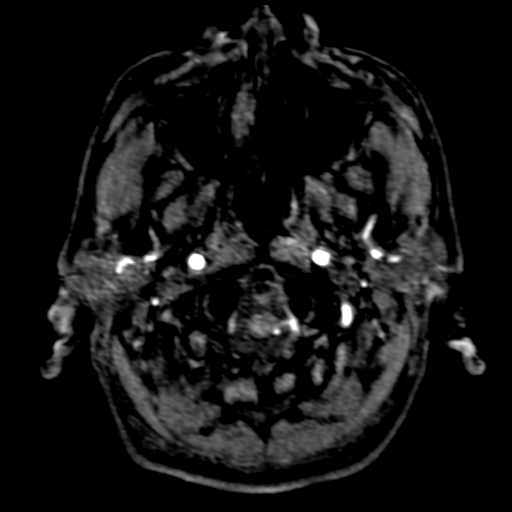
[im 20/130]
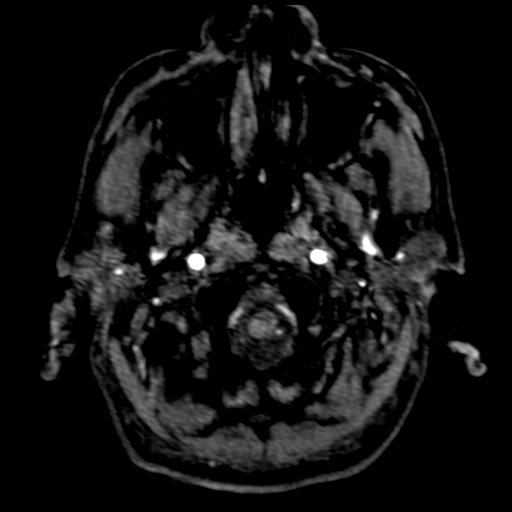
[im 23/130]
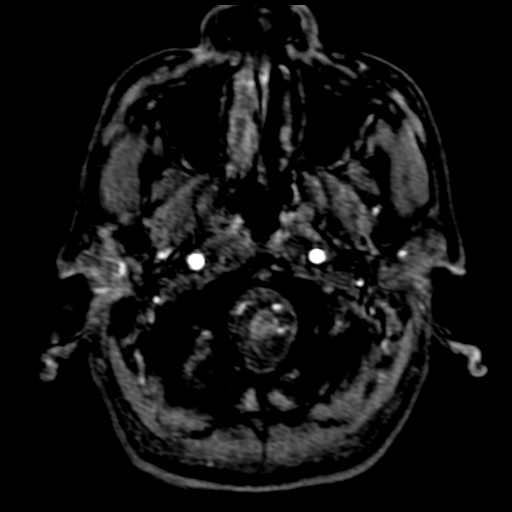
[im 26/130]
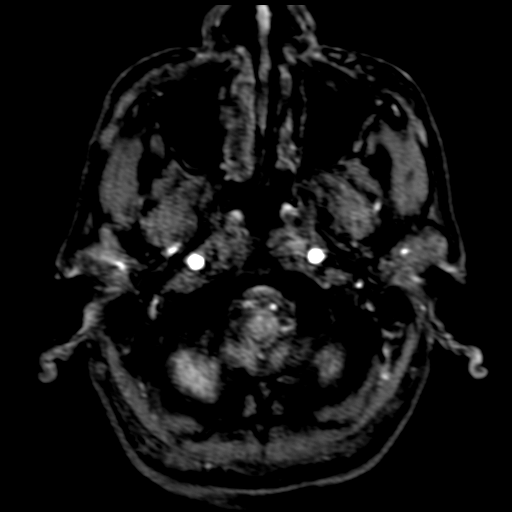
[im 40/130]
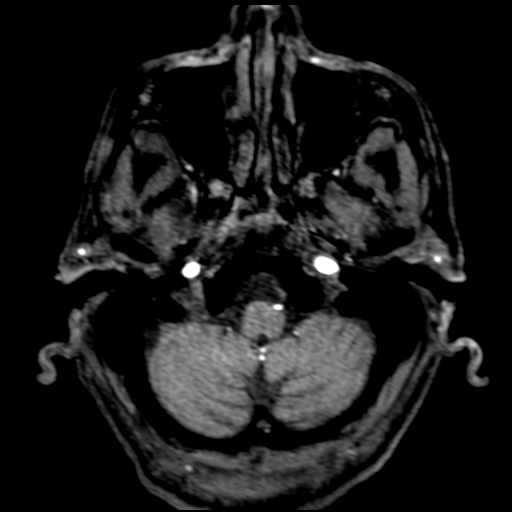
[im 57/130]
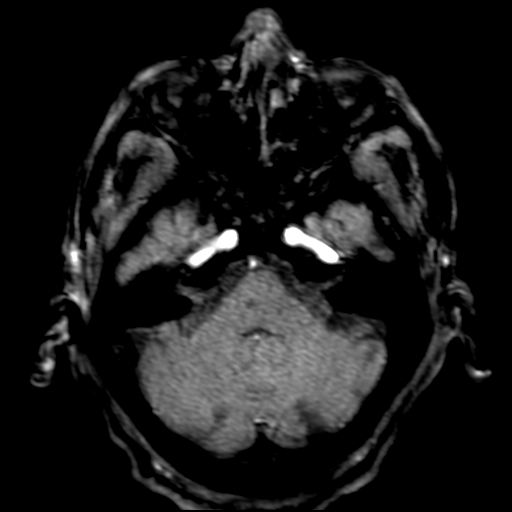
[im 65/130]
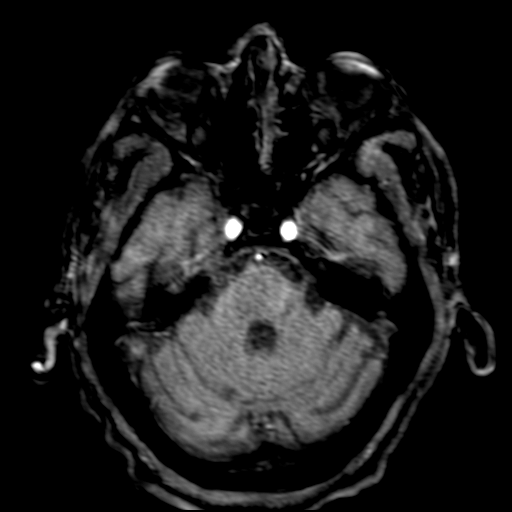
[im 73/130]
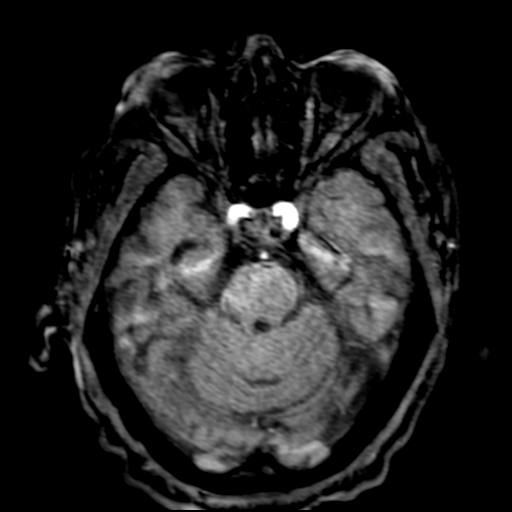
[im 90/130]
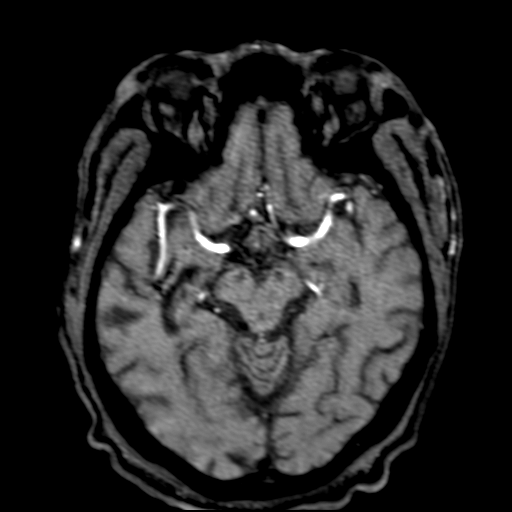
[im 107/130]
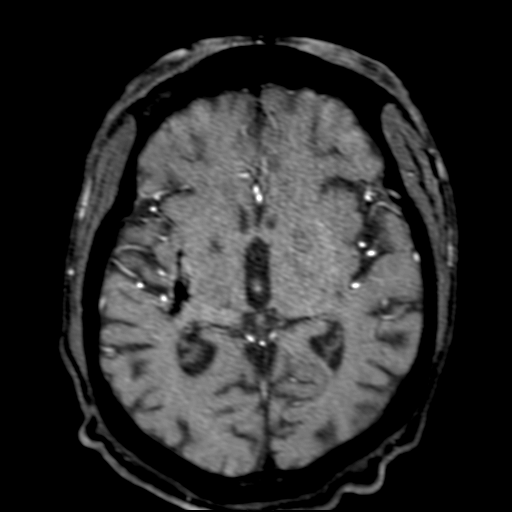
[im 110/130]
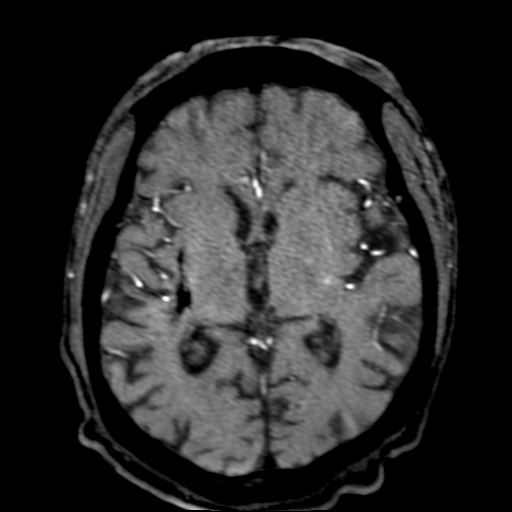
[im 124/130]
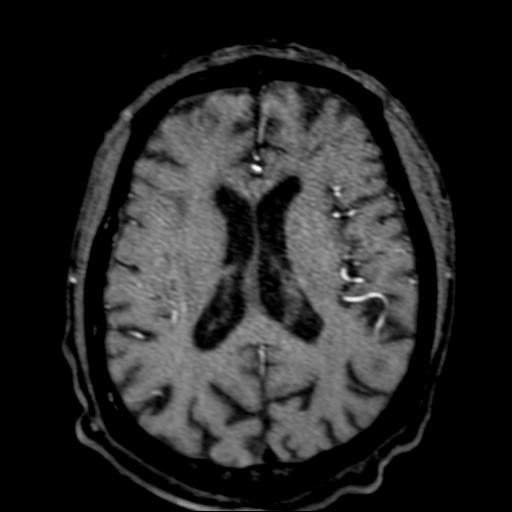

[Series 18: 3d cow_mip_tra · axial · 97.5mm · 0.41mm/px · 1 of 1 slices shown]
[im 1/1]
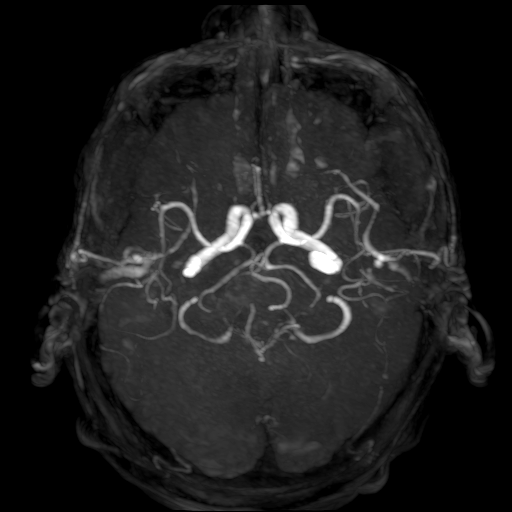

[19 of 48 positions shown; findings below may reference images not displayed]

FINDINGS: MRI HEAD FINDINGS

Brain: No acute infarct, mass effect or extra-axial collection.
Remote right temporal and external capsule hemorrhage. No acute
hemorrhage. There is multifocal hyperintense T2-weighted signal
within the white matter. Generalized volume loss without a clear
lobar predilection. Old left frontal infarct. There are numerous old
cerebellar infarcts, unchanged. The midline structures are normal.

Vascular: Major flow voids are preserved.

Skull and upper cervical spine: Normal calvarium and skull base.
Visualized upper cervical spine and soft tissues are normal.

Sinuses/Orbits:No paranasal sinus fluid levels or advanced mucosal
thickening. No mastoid or middle ear effusion. Normal orbits.

MRA HEAD FINDINGS

POSTERIOR CIRCULATION:

--Vertebral arteries: Normal

--Inferior cerebellar arteries: Normal.

--Basilar artery: Normal.

--Superior cerebellar arteries: Normal.

--Posterior cerebral arteries: Normal. Both are predominantly
supplied by the posterior communicating arteries (p-comm).

ANTERIOR CIRCULATION:

--Intracranial internal carotid arteries: Normal.

--Anterior cerebral arteries (ACA): Normal.

--Middle cerebral arteries (MCA): Normal.

ANATOMIC VARIANTS: Bilateral fetal origins of the posterior cerebral
arteries.
IMPRESSION: 1. No acute intracranial abnormality.
2. Normal intracranial MRA.
3. Remote right temporal and external capsule hemorrhage.
4. Numerous old cerebellar infarcts and chronic ischemic white
matter disease.

## 2020-05-09 IMAGING — CT CT HEAD W/O CM
3 of 4 series · 15 of 47 positions shown, 18 images · non-contrast
Comparison: [DATE]

CLINICAL DATA: Acute neuro deficit.  Rule out stroke

EXAM:
CT HEAD WITHOUT CONTRAST
TECHNIQUE: Contiguous axial images were obtained from the base of the skull
through the vertex without intravenous contrast.

[Series 4: head 2.0 h70h · axial · 0.47mm/px · z∈[-158,-30]mm · 9 of 80 slices shown, 12 images]
[im 8/80  brain]
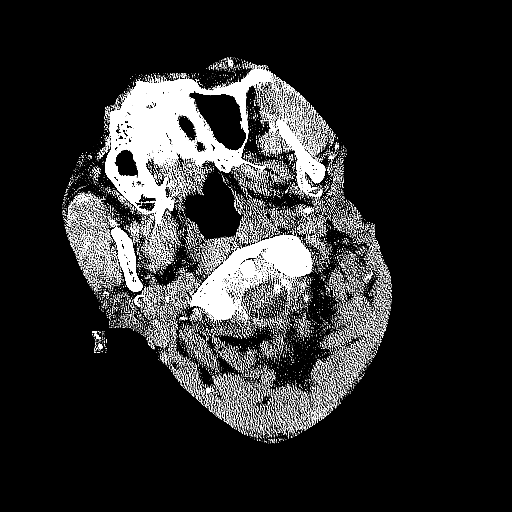
[im 8/80  bone]
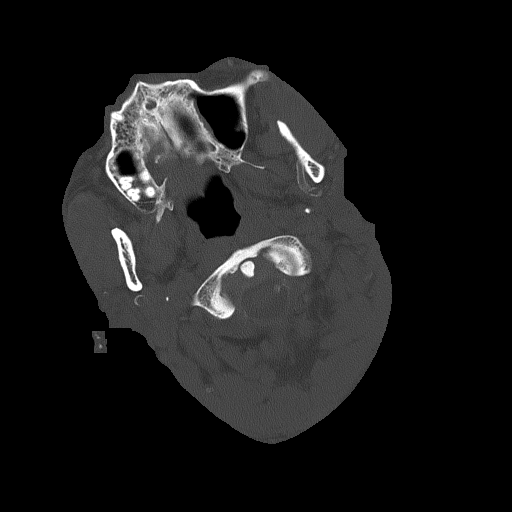
[im 16/80  brain]
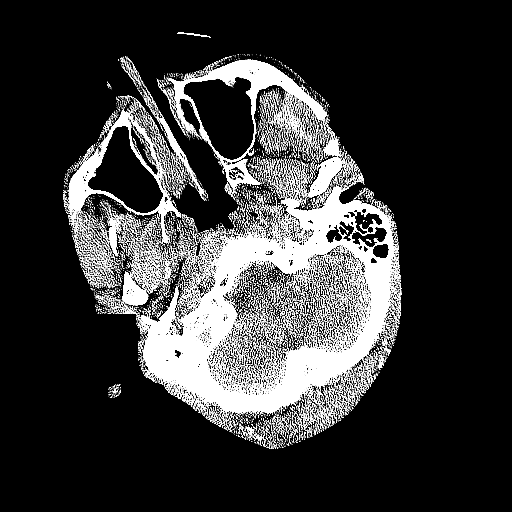
[im 24/80  brain]
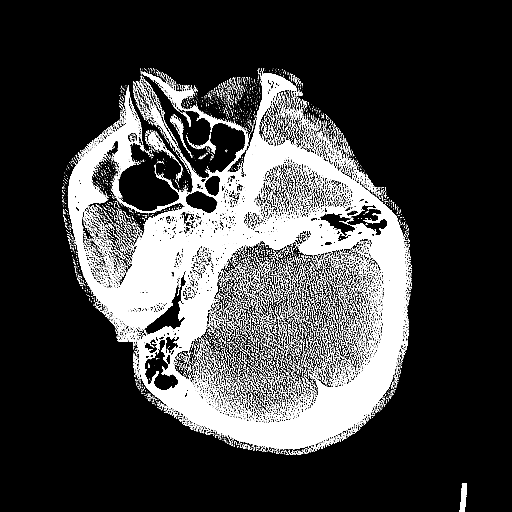
[im 32/80  brain]
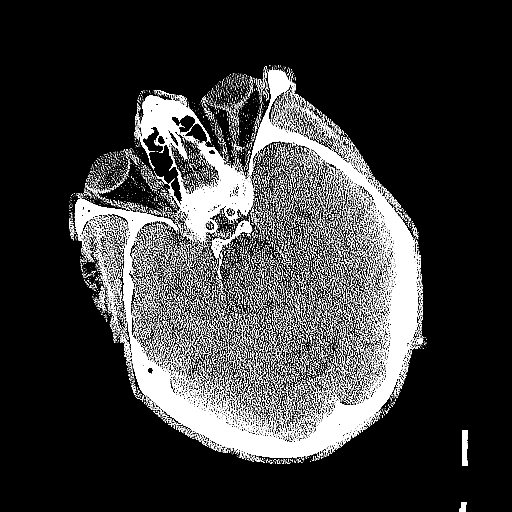
[im 40/80  brain]
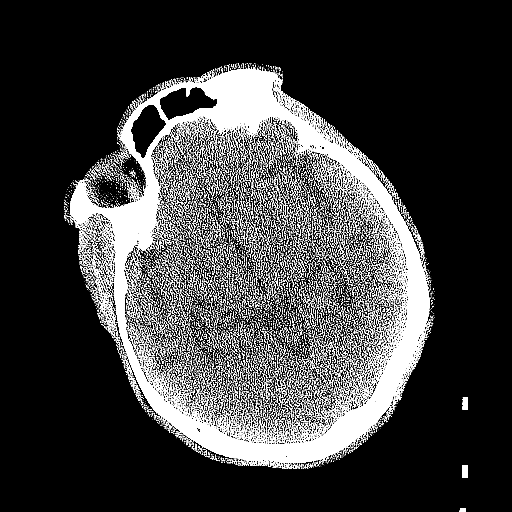
[im 40/80  bone]
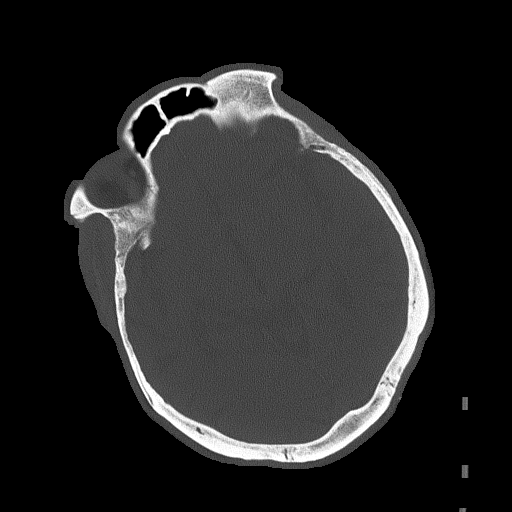
[im 48/80  brain]
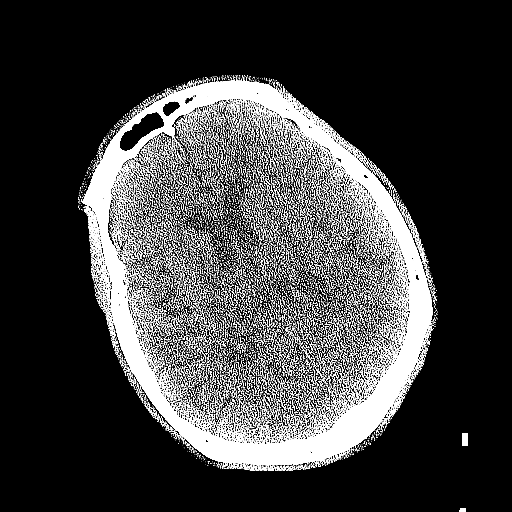
[im 56/80  brain]
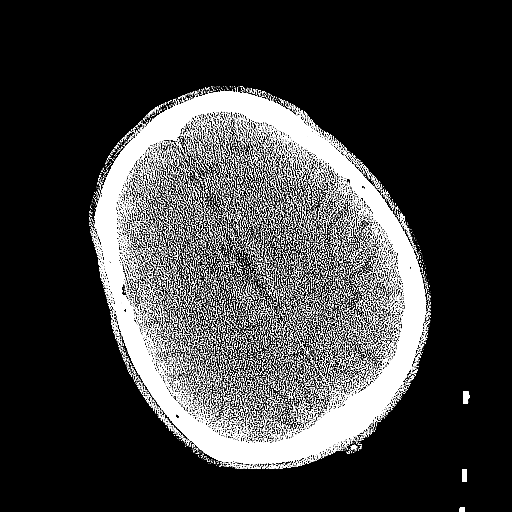
[im 64/80  brain]
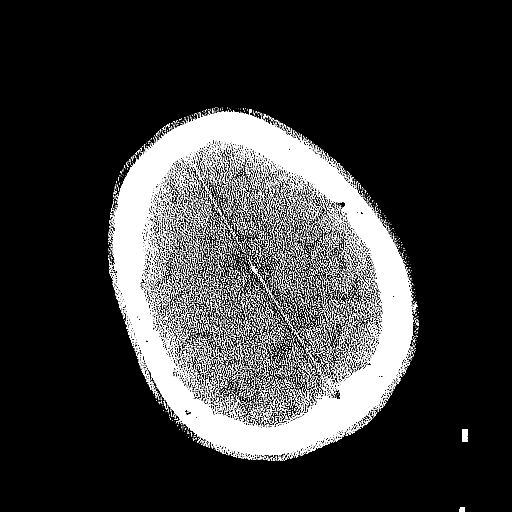
[im 72/80  brain]
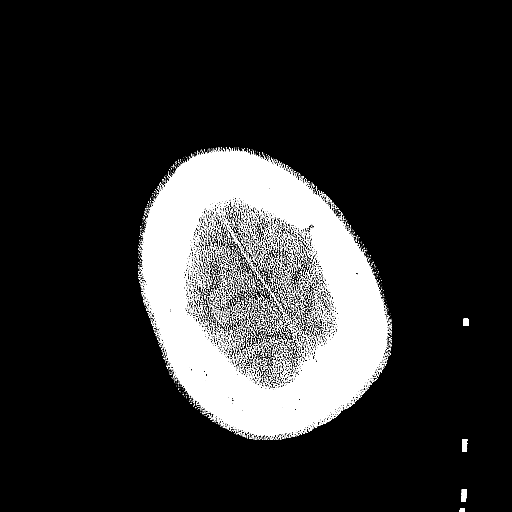
[im 72/80  bone]
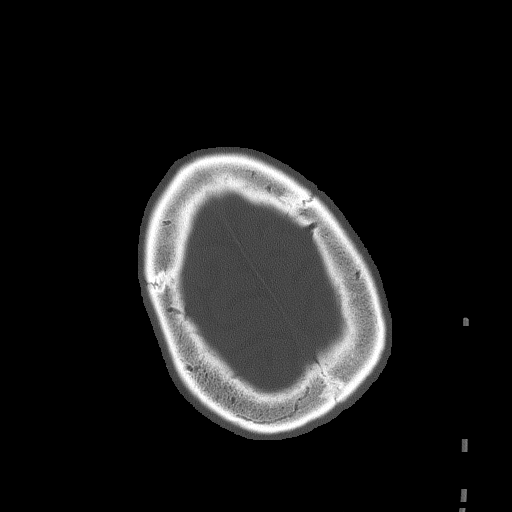

[Series 5: head 3.0 mpr cor · coronal · 0.35mm/px · 3 of 73 slices shown]
[im 25/73  brain]
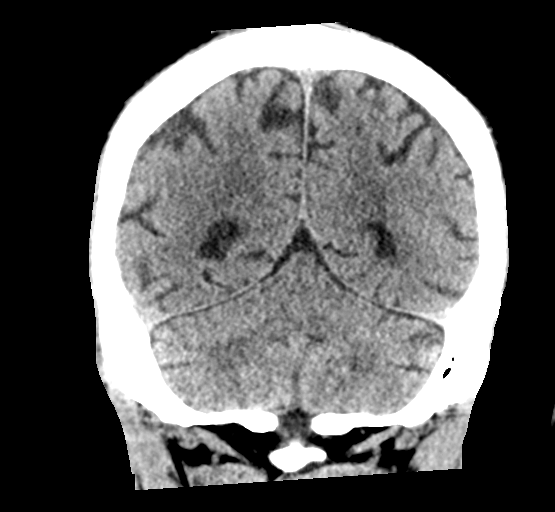
[im 33/73  brain]
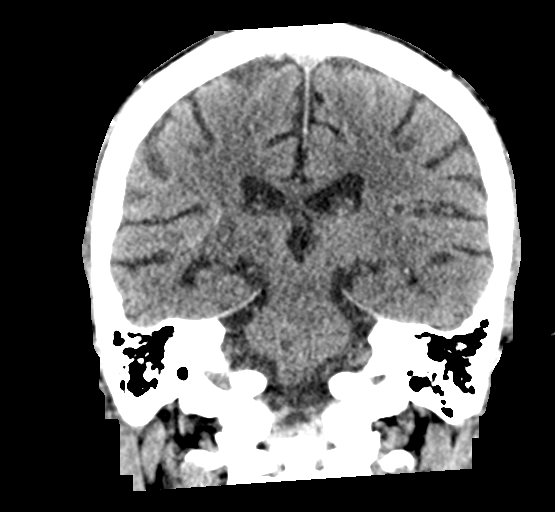
[im 41/73  brain]
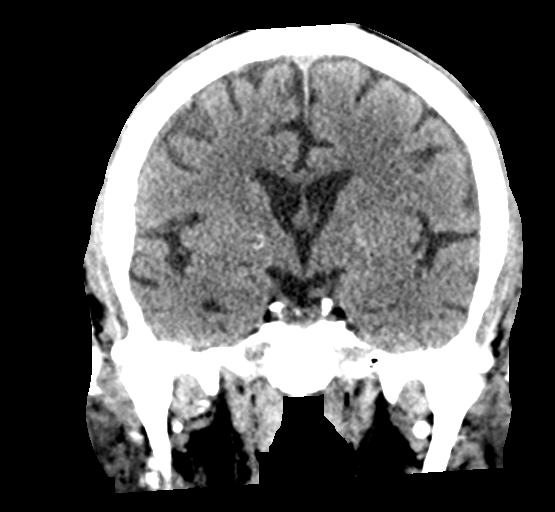

[Series 6: head 3.0 mpr sag · sagittal · 0.31mm/px · 3 of 61 slices shown]
[im 21/61  brain]
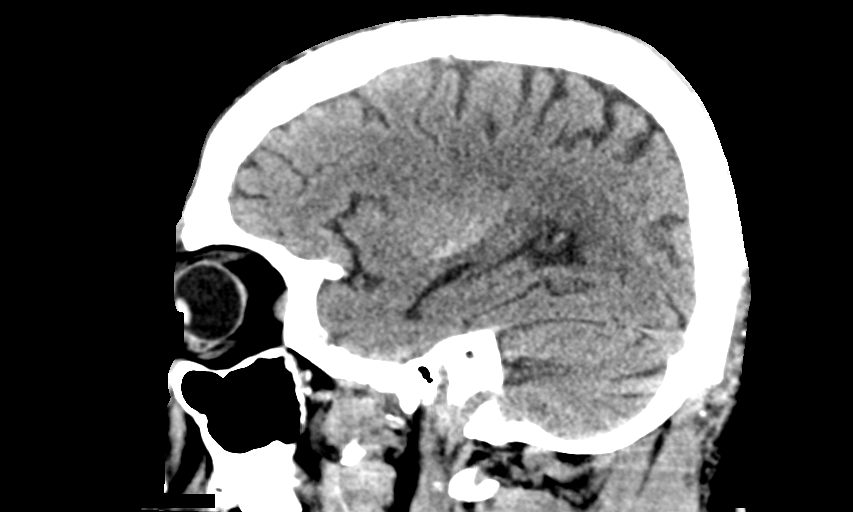
[im 31/61  brain]
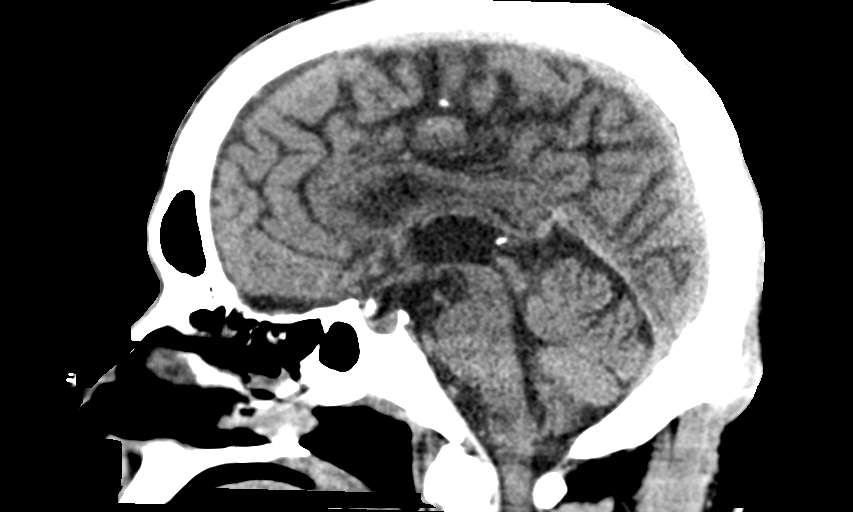
[im 41/61  brain]
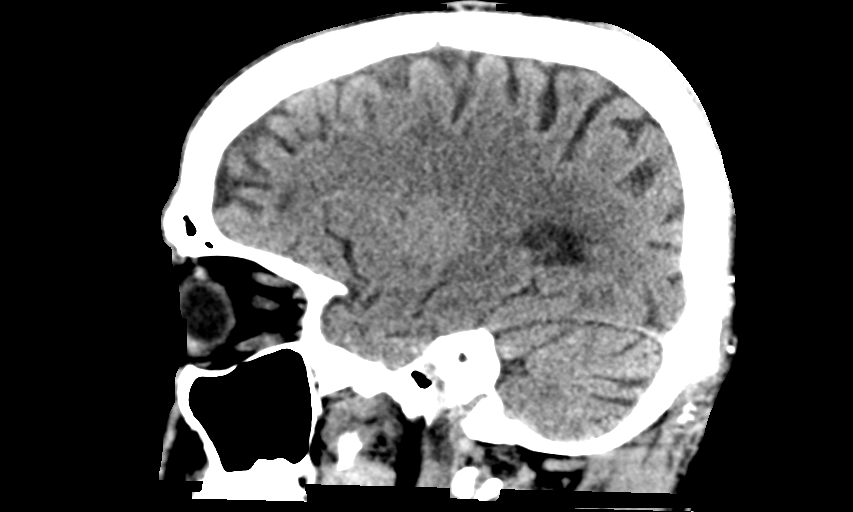

[15 of 47 positions shown; findings below may reference images not displayed]

FINDINGS: Brain: Generalized atrophy. Patchy white matter hypodensity
bilaterally, unchanged.

Negative for acute cortical infarct, acute hemorrhage, or mass
lesion.

Vascular: Negative for hyperdense vessel

Skull: Negative

Sinuses/Orbits: Mild mucosal edema paranasal sinuses. Negative orbit

Other: None
IMPRESSION: Mild atrophy and chronic microvascular ischemic change in the white
matter. No acute abnormality.

## 2020-05-09 IMAGING — MR MR MRA HEAD W/O CM
12 of 13 series · 44 of 48 positions shown · non-contrast
Comparison: Head CT [DATE]

CLINICAL DATA: Transient ischemic attack

EXAM:
MRI HEAD WITHOUT CONTRAST
MRA HEAD WITHOUT CONTRAST
TECHNIQUE: Multiplanar, multiecho pulse sequences of the brain and surrounding
structures were obtained without intravenous contrast. Angiographic
images of the head were obtained using MRA technique without
contrast.

[Series 5: DWI · axial · 3.0mm · 0.88mm/px · z∈[-105,+41]mm · 8 of 100 slices shown (1 of 4)]
[im 1/100]
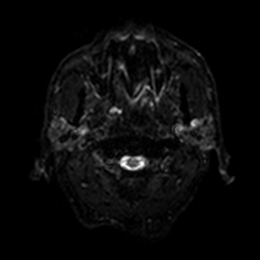
[im 15/100]
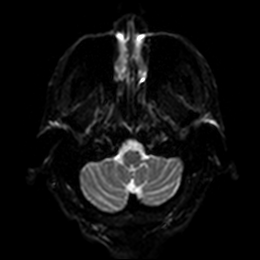
[im 29/100]
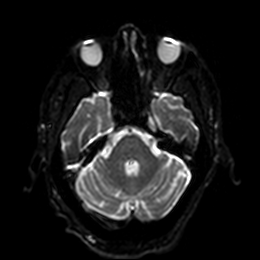
[im 43/100]
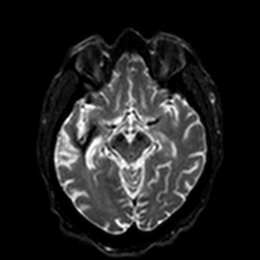
[im 57/100]
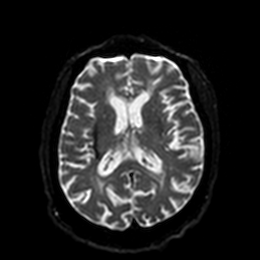
[im 71/100]
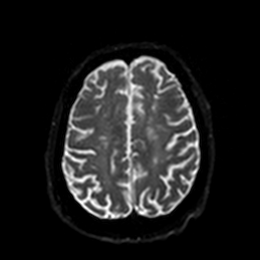
[im 85/100]
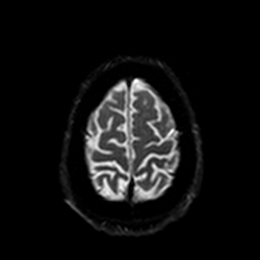
[im 100/100]
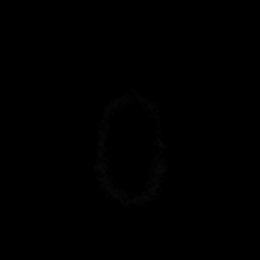

[Series 6: DWI · axial · 3.0mm · 0.88mm/px · z∈[-105,+41]mm · 4 of 48 slices shown (2 of 4)]
[im 1/48]
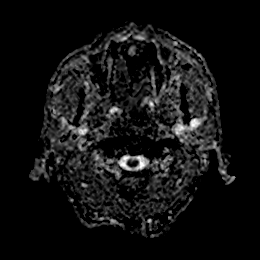
[im 16/48]
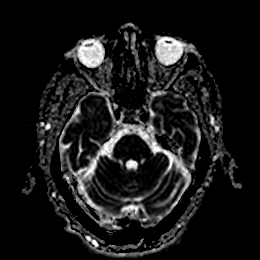
[im 32/48]
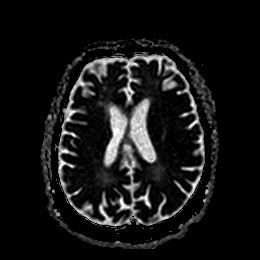
[im 48/48]
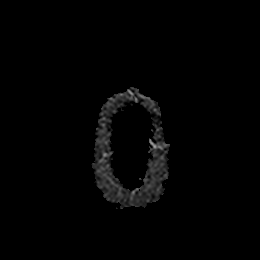

[Series 7: DWI · coronal · 4.0mm · 0.88mm/px · 5 of 68 slices shown (3 of 4)]
[im 1/68]
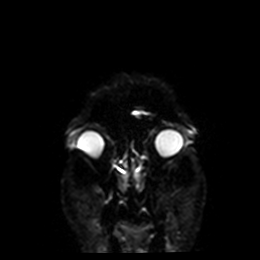
[im 17/68]
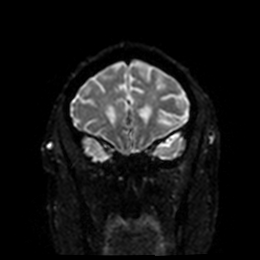
[im 34/68]
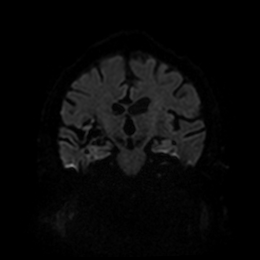
[im 51/68]
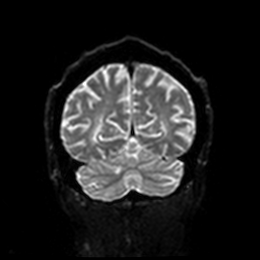
[im 68/68]
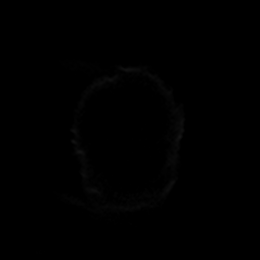

[Series 8: DWI · coronal · 4.0mm · 0.88mm/px · 3 of 33 slices shown (4 of 4)]
[im 1/33]
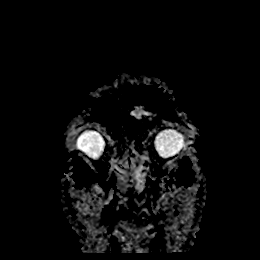
[im 17/33]
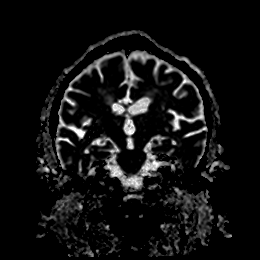
[im 33/33]
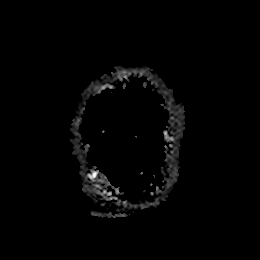

[Series 13: T1 · sagittal · 5.0mm · 0.75mm/px · 2 of 25 slices shown]
[im 1/25]
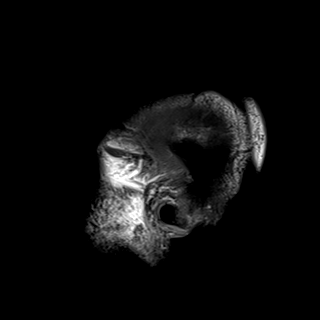
[im 25/25]
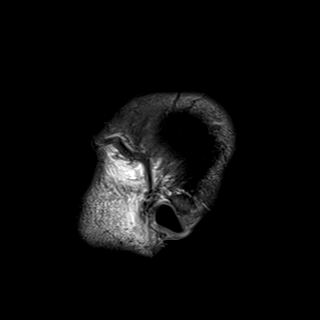

[Series 14: T2 · axial · 5.0mm · 0.72mm/px · z∈[-118,+37]mm · 2 of 27 slices shown (1 of 2)]
[im 1/27]
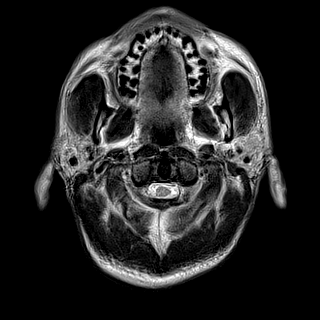
[im 27/27]
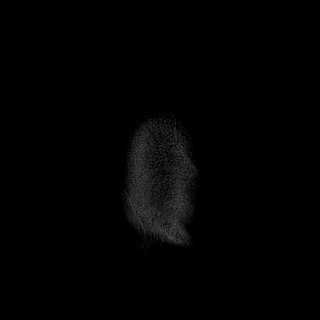

[Series 15: FLAIR · axial · 5.0mm · 0.45mm/px · z∈[-117,+38]mm · 2 of 27 slices shown]
[im 1/27]
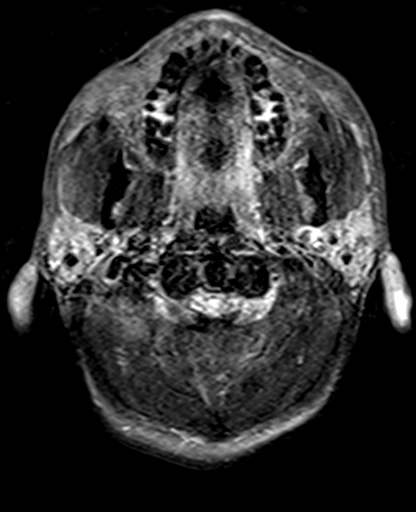
[im 27/27]
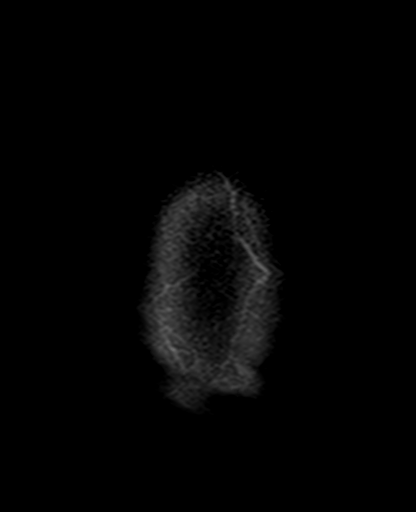

[Series 16: mag_images · axial · 3.0mm · 0.90mm/px · z∈[-122,+43]mm · 4 of 56 slices shown]
[im 1/56]
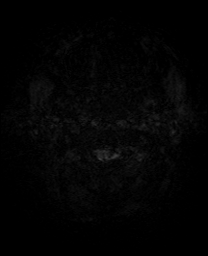
[im 19/56]
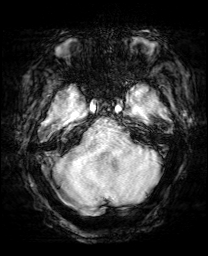
[im 37/56]
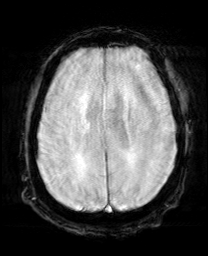
[im 56/56]
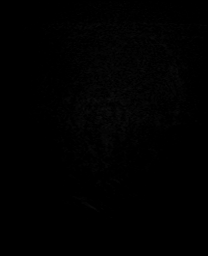

[Series 17: pha_images · axial · 3.0mm · 0.90mm/px · z∈[-122,+43]mm · 4 of 56 slices shown]
[im 1/56]
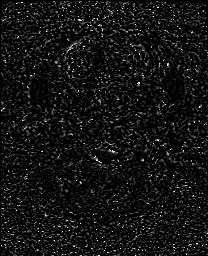
[im 19/56]
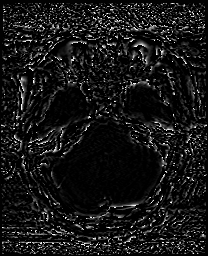
[im 37/56]
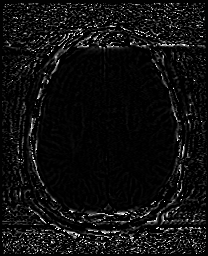
[im 56/56]
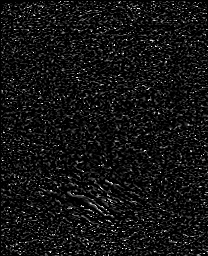

[Series 18: swi_images · axial · 3.0mm · 0.90mm/px · z∈[-122,+43]mm · 4 of 56 slices shown]
[im 1/56]
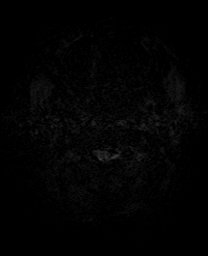
[im 19/56]
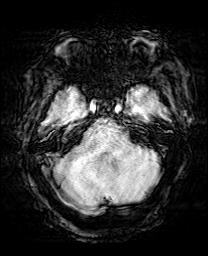
[im 37/56]
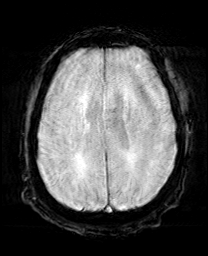
[im 56/56]
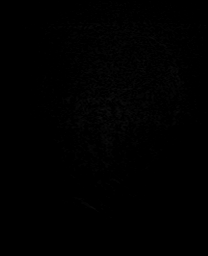

[Series 19: mip_images(sw) · axial · 24.0mm · 0.90mm/px · z∈[-111,+32]mm · 4 of 49 slices shown]
[im 1/49]
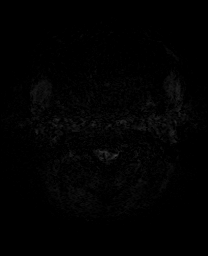
[im 17/49]
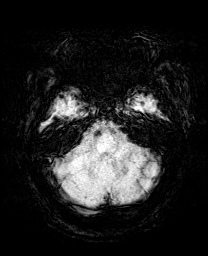
[im 33/49]
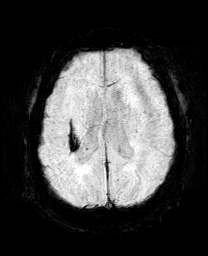
[im 49/49]
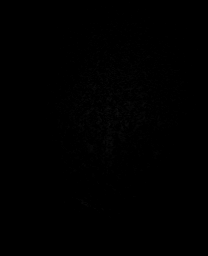

[Series 21: T2 · coronal · 5.0mm · 0.34mm/px · 2 of 29 slices shown (2 of 2)]
[im 1/29]
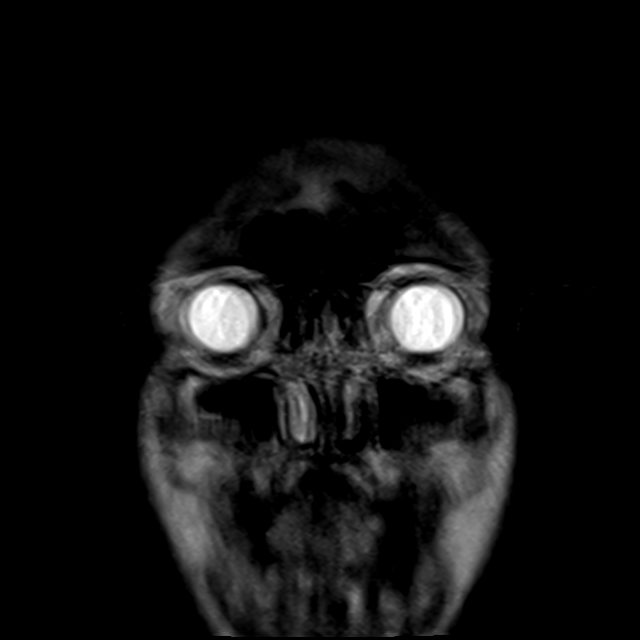
[im 29/29]
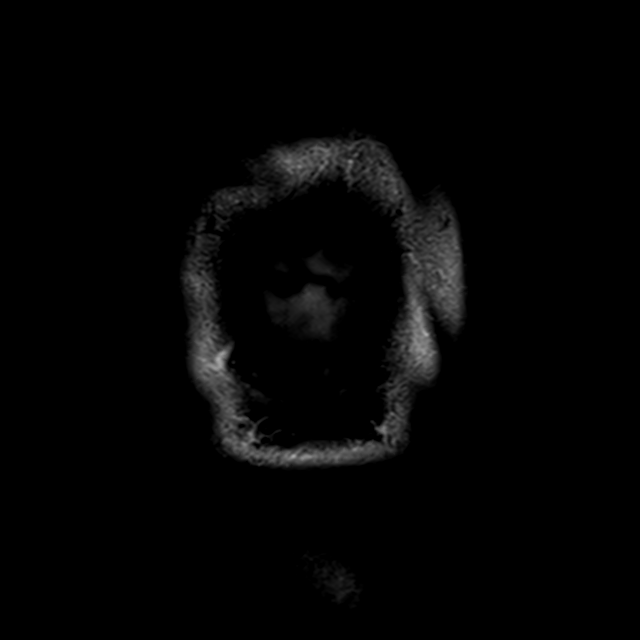

[44 of 48 positions shown; findings below may reference images not displayed]

FINDINGS: MRI HEAD FINDINGS

Brain: No acute infarct, mass effect or extra-axial collection.
Remote right temporal and external capsule hemorrhage. No acute
hemorrhage. There is multifocal hyperintense T2-weighted signal
within the white matter. Generalized volume loss without a clear
lobar predilection. Old left frontal infarct. There are numerous old
cerebellar infarcts, unchanged. The midline structures are normal.

Vascular: Major flow voids are preserved.

Skull and upper cervical spine: Normal calvarium and skull base.
Visualized upper cervical spine and soft tissues are normal.

Sinuses/Orbits:No paranasal sinus fluid levels or advanced mucosal
thickening. No mastoid or middle ear effusion. Normal orbits.

MRA HEAD FINDINGS

POSTERIOR CIRCULATION:

--Vertebral arteries: Normal

--Inferior cerebellar arteries: Normal.

--Basilar artery: Normal.

--Superior cerebellar arteries: Normal.

--Posterior cerebral arteries: Normal. Both are predominantly
supplied by the posterior communicating arteries (p-comm).

ANTERIOR CIRCULATION:

--Intracranial internal carotid arteries: Normal.

--Anterior cerebral arteries (ACA): Normal.

--Middle cerebral arteries (MCA): Normal.

ANATOMIC VARIANTS: Bilateral fetal origins of the posterior cerebral
arteries.
IMPRESSION: 1. No acute intracranial abnormality.
2. Normal intracranial MRA.
3. Remote right temporal and external capsule hemorrhage.
4. Numerous old cerebellar infarcts and chronic ischemic white
matter disease.

## 2020-05-09 IMAGING — MR MR HEAD W/O CM
12 of 15 series · 35 of 48 positions shown · non-contrast
Comparison: Head CT [DATE]

CLINICAL DATA: Transient ischemic attack

EXAM:
MRI HEAD WITHOUT CONTRAST
MRA HEAD WITHOUT CONTRAST
TECHNIQUE: Multiplanar, multiecho pulse sequences of the brain and surrounding
structures were obtained without intravenous contrast. Angiographic
images of the head were obtained using MRA technique without
contrast.

[Series 5: DWI · axial · 3.0mm · 0.88mm/px · z∈[-105,+41]mm · 5 of 100 slices shown (1 of 4)]
[im 1/100]
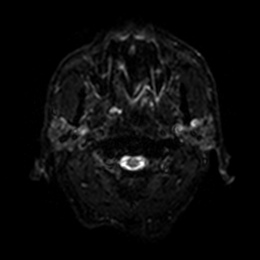
[im 25/100]
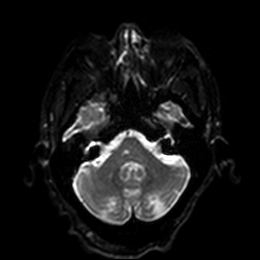
[im 50/100]
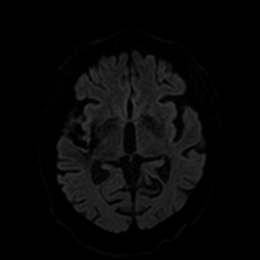
[im 75/100]
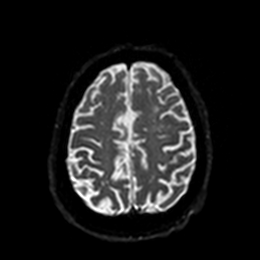
[im 100/100]
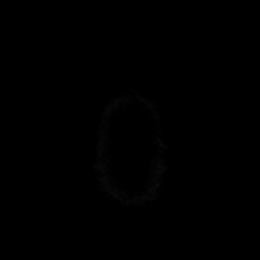

[Series 6: DWI · axial · 3.0mm · 0.88mm/px · z∈[-105,+41]mm · 2 of 48 slices shown (2 of 4)]
[im 1/48]
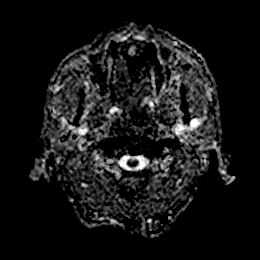
[im 48/48]
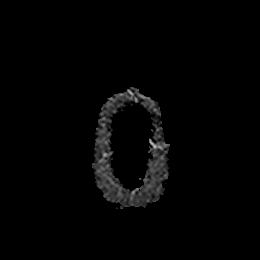

[Series 7: DWI · coronal · 4.0mm · 0.88mm/px · 3 of 68 slices shown (3 of 4)]
[im 1/68]
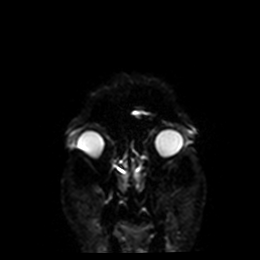
[im 34/68]
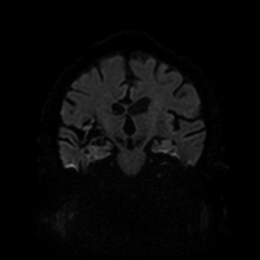
[im 68/68]
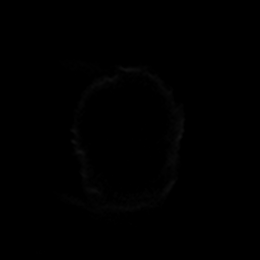

[Series 8: DWI · coronal · 4.0mm · 0.88mm/px · 2 of 33 slices shown (4 of 4)]
[im 1/33]
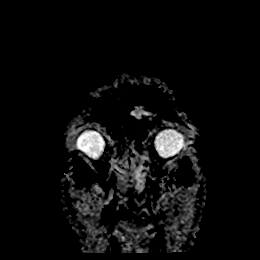
[im 33/33]
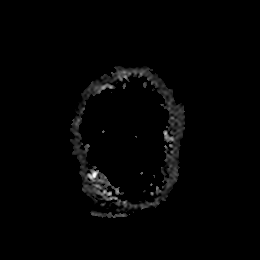

[Series 13: T1 · sagittal · 5.0mm · 0.75mm/px · 2 of 25 slices shown]
[im 1/25]
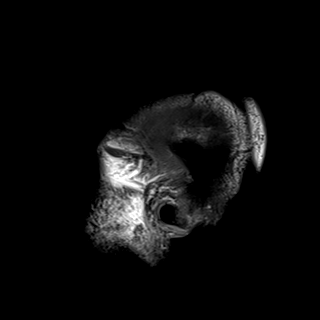
[im 25/25]
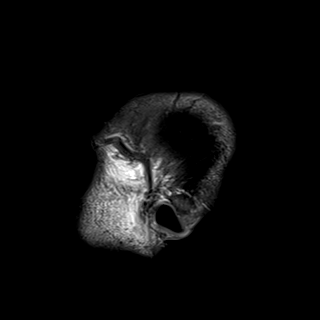

[Series 14: T2 · axial · 5.0mm · 0.72mm/px · z∈[-118,+37]mm · 2 of 27 slices shown (1 of 2)]
[im 1/27]
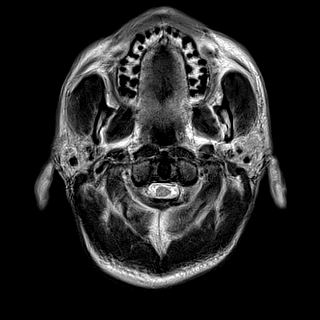
[im 27/27]
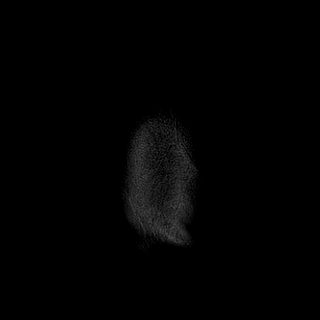

[Series 19: FLAIR · axial · 5.0mm · 0.45mm/px · z∈[-117,+38]mm · 2 of 27 slices shown]
[im 1/27]
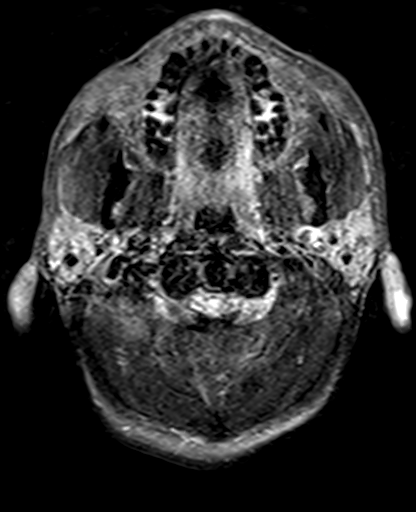
[im 27/27]
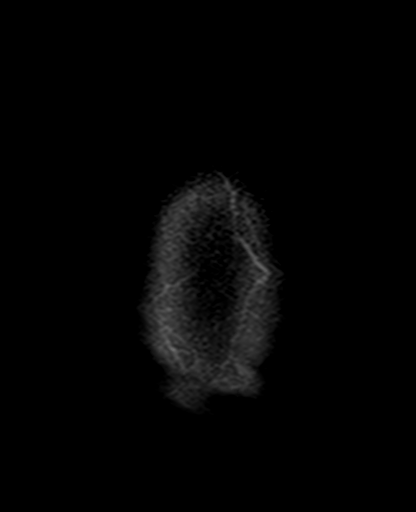

[Series 20: mag_images · axial · 3.0mm · 0.90mm/px · z∈[-122,+43]mm · 4 of 56 slices shown]
[im 1/56]
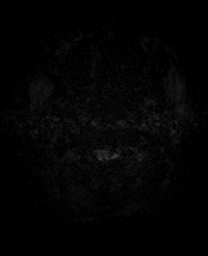
[im 19/56]
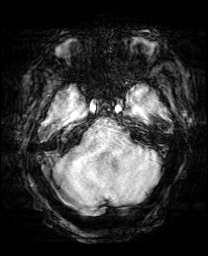
[im 37/56]
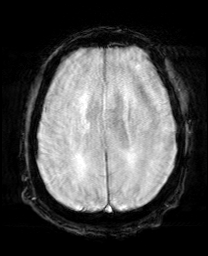
[im 56/56]
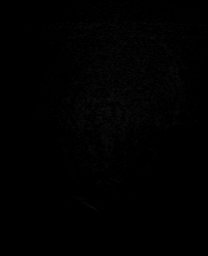

[Series 21: pha_images · axial · 3.0mm · 0.90mm/px · z∈[-122,+43]mm · 4 of 56 slices shown]
[im 1/56]
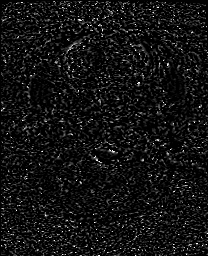
[im 19/56]
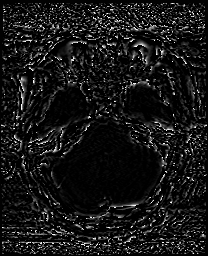
[im 37/56]
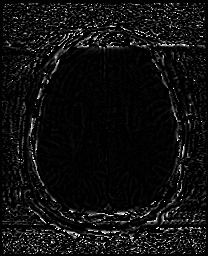
[im 56/56]
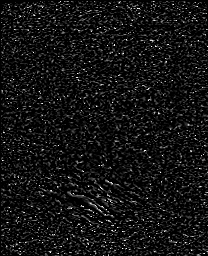

[Series 22: swi_images · axial · 3.0mm · 0.90mm/px · z∈[-122,+43]mm · 4 of 56 slices shown]
[im 1/56]
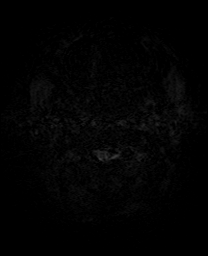
[im 19/56]
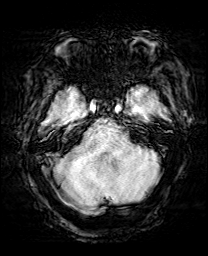
[im 37/56]
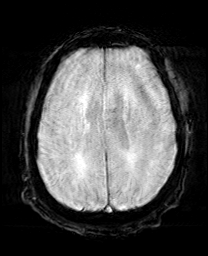
[im 56/56]
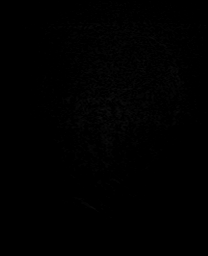

[Series 23: mip_images(sw) · axial · 24.0mm · 0.90mm/px · z∈[-111,+32]mm · 3 of 49 slices shown]
[im 1/49]
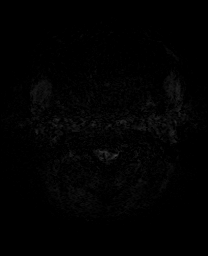
[im 25/49]
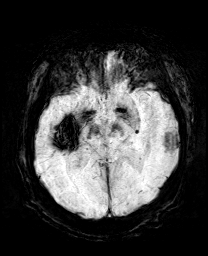
[im 49/49]
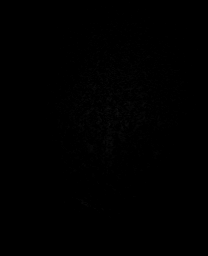

[Series 25: T2 · coronal · 5.0mm · 0.34mm/px · 2 of 29 slices shown (2 of 2)]
[im 1/29]
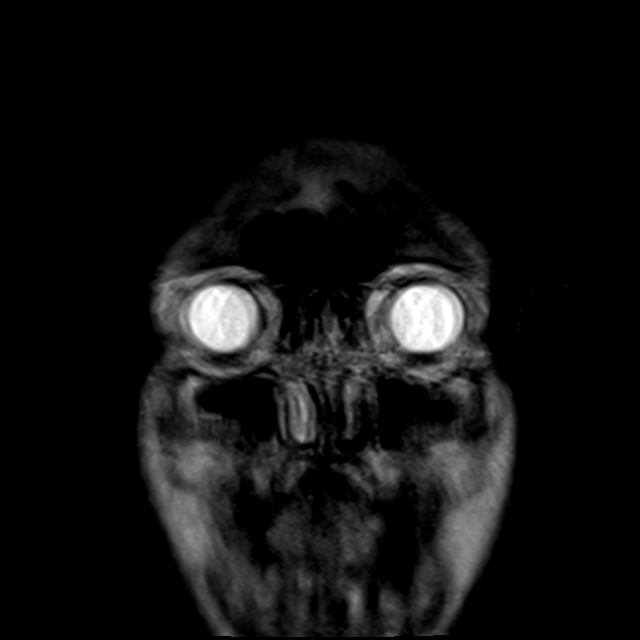
[im 29/29]
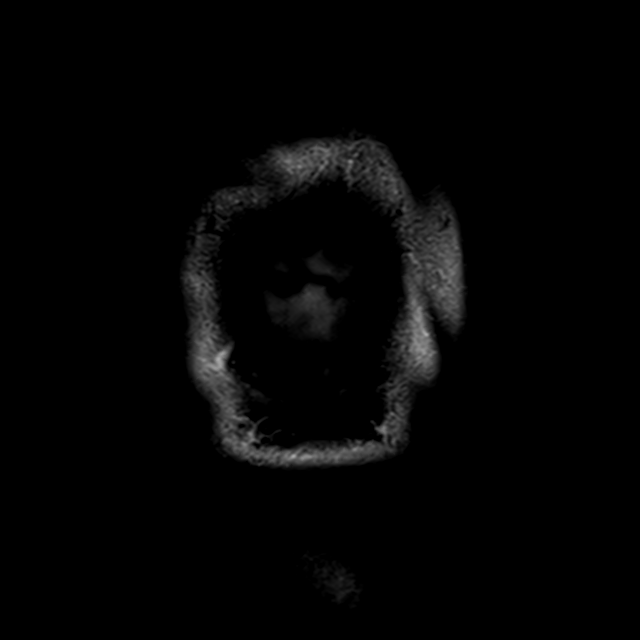

[35 of 48 positions shown; findings below may reference images not displayed]

FINDINGS: MRI HEAD FINDINGS

Brain: No acute infarct, mass effect or extra-axial collection.
Remote right temporal and external capsule hemorrhage. No acute
hemorrhage. There is multifocal hyperintense T2-weighted signal
within the white matter. Generalized volume loss without a clear
lobar predilection. Old left frontal infarct. There are numerous old
cerebellar infarcts, unchanged. The midline structures are normal.

Vascular: Major flow voids are preserved.

Skull and upper cervical spine: Normal calvarium and skull base.
Visualized upper cervical spine and soft tissues are normal.

Sinuses/Orbits:No paranasal sinus fluid levels or advanced mucosal
thickening. No mastoid or middle ear effusion. Normal orbits.

MRA HEAD FINDINGS

POSTERIOR CIRCULATION:

--Vertebral arteries: Normal

--Inferior cerebellar arteries: Normal.

--Basilar artery: Normal.

--Superior cerebellar arteries: Normal.

--Posterior cerebral arteries: Normal. Both are predominantly
supplied by the posterior communicating arteries (p-comm).

ANTERIOR CIRCULATION:

--Intracranial internal carotid arteries: Normal.

--Anterior cerebral arteries (ACA): Normal.

--Middle cerebral arteries (MCA): Normal.

ANATOMIC VARIANTS: Bilateral fetal origins of the posterior cerebral
arteries.
IMPRESSION: 1. No acute intracranial abnormality.
2. Normal intracranial MRA.
3. Remote right temporal and external capsule hemorrhage.
4. Numerous old cerebellar infarcts and chronic ischemic white
matter disease.

## 2020-05-09 MED ORDER — CARVEDILOL 12.5 MG PO TABS
12.5000 mg | ORAL_TABLET | Freq: Two times a day (BID) | ORAL | Status: DC
  Administered 2020-05-09 – 2020-05-11 (×4): 12.5 mg via ORAL
  Filled 2020-05-09: qty 1
  Filled 2020-05-09: qty 4
  Filled 2020-05-09 (×2): qty 1

## 2020-05-09 MED ORDER — ASPIRIN EC 81 MG PO TBEC
81.0000 mg | DELAYED_RELEASE_TABLET | Freq: Every day | ORAL | Status: DC
  Administered 2020-05-10 – 2020-05-11 (×2): 81 mg via ORAL
  Filled 2020-05-09 (×2): qty 1

## 2020-05-09 MED ORDER — ACETAMINOPHEN 650 MG RE SUPP: 650.0000 mg | RECTAL | Status: DC | PRN

## 2020-05-09 MED ORDER — ACETAMINOPHEN 325 MG PO TABS: 650.0000 mg | ORAL_TABLET | ORAL | Status: DC | PRN

## 2020-05-09 MED ORDER — STROKE: EARLY STAGES OF RECOVERY BOOK
Freq: Once | Status: AC
  Filled 2020-05-09: qty 1

## 2020-05-09 MED ORDER — ATORVASTATIN CALCIUM 80 MG PO TABS
80.0000 mg | ORAL_TABLET | Freq: Every day | ORAL | Status: DC
  Administered 2020-05-10 – 2020-05-11 (×2): 80 mg via ORAL
  Filled 2020-05-09 (×2): qty 1

## 2020-05-09 MED ORDER — LORAZEPAM 1 MG PO TABS
1.0000 mg | ORAL_TABLET | Freq: Once | ORAL | Status: AC
  Administered 2020-05-10: 1 mg via ORAL
  Filled 2020-05-09: qty 1

## 2020-05-09 MED ORDER — ACETAMINOPHEN 160 MG/5ML PO SOLN: 650.0000 mg | ORAL | Status: DC | PRN

## 2020-05-09 NOTE — Progress Notes (Signed)
Attempted to scan this patient. 5 minutes into scan he complained of severe chest pain and was unable to continue. RN to request an order for medication. We will try again later tonight with medication.
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
## 2020-05-09 NOTE — ED Triage Notes (Signed)
Pt was at dialysis became altered after receiving 1 hr of his treatment. Per ems, pt is back to his baseline upon their arrival.   cbg 152  100% ra  rr 18  bp 170/90
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
## 2020-05-10 ENCOUNTER — Observation Stay (HOSPITAL_BASED_OUTPATIENT_CLINIC_OR_DEPARTMENT_OTHER): Payer: Medicare Other

## 2020-05-10 ENCOUNTER — Encounter (HOSPITAL_COMMUNITY): Payer: Self-pay | Admitting: Family Medicine

## 2020-05-10 ENCOUNTER — Encounter (INDEPENDENT_AMBULATORY_CARE_PROVIDER_SITE_OTHER): Payer: Medicare Other | Admitting: Ophthalmology

## 2020-05-10 ENCOUNTER — Observation Stay (HOSPITAL_COMMUNITY): Payer: Medicare Other

## 2020-05-10 DIAGNOSIS — Z8673 Personal history of transient ischemic attack (TIA), and cerebral infarction without residual deficits: Secondary | ICD-10-CM | POA: Insufficient documentation

## 2020-05-10 DIAGNOSIS — G459 Transient cerebral ischemic attack, unspecified: Secondary | ICD-10-CM | POA: Diagnosis not present

## 2020-05-10 DIAGNOSIS — R4182 Altered mental status, unspecified: Secondary | ICD-10-CM | POA: Diagnosis not present

## 2020-05-10 DIAGNOSIS — R401 Stupor: Secondary | ICD-10-CM | POA: Diagnosis not present

## 2020-05-10 HISTORY — DX: Personal history of transient ischemic attack (TIA), and cerebral infarction without residual deficits: Z86.73

## 2020-05-10 LAB — CBC WITH DIFFERENTIAL/PLATELET
Abs Immature Granulocytes: 0.01 10*3/uL (ref 0.00–0.07)
Basophils Absolute: 0 10*3/uL (ref 0.0–0.1)
Basophils Relative: 1 %
Eosinophils Absolute: 0.3 10*3/uL (ref 0.0–0.5)
Eosinophils Relative: 5 %
HCT: 32.8 % — ABNORMAL LOW (ref 39.0–52.0)
Hemoglobin: 10.6 g/dL — ABNORMAL LOW (ref 13.0–17.0)
Immature Granulocytes: 0 %
Lymphocytes Relative: 33 %
Lymphs Abs: 1.8 10*3/uL (ref 0.7–4.0)
MCH: 28.2 pg (ref 26.0–34.0)
MCHC: 32.3 g/dL (ref 30.0–36.0)
MCV: 87.2 fL (ref 80.0–100.0)
Monocytes Absolute: 0.6 10*3/uL (ref 0.1–1.0)
Monocytes Relative: 10 %
Neutro Abs: 2.8 10*3/uL (ref 1.7–7.7)
Neutrophils Relative %: 51 %
Platelets: 87 10*3/uL — ABNORMAL LOW (ref 150–400)
RBC: 3.76 MIL/uL — ABNORMAL LOW (ref 4.22–5.81)
RDW: 17.6 % — ABNORMAL HIGH (ref 11.5–15.5)
WBC: 5.5 10*3/uL (ref 4.0–10.5)
nRBC: 0 % (ref 0.0–0.2)

## 2020-05-10 LAB — ECHOCARDIOGRAM COMPLETE
AR max vel: 3.5 cm2
AV Area VTI: 2.94 cm2
AV Area mean vel: 3.2 cm2
AV Mean grad: 3 mmHg
AV Peak grad: 4.3 mmHg
Ao pk vel: 1.04 m/s
Area-P 1/2: 6.17 cm2
Height: 70 in
S' Lateral: 2.7 cm
Weight: 2303.37 oz

## 2020-05-10 LAB — BASIC METABOLIC PANEL
Anion gap: 13 (ref 5–15)
BUN: 40 mg/dL — ABNORMAL HIGH (ref 6–20)
CO2: 25 mmol/L (ref 22–32)
Calcium: 8.9 mg/dL (ref 8.9–10.3)
Chloride: 99 mmol/L (ref 98–111)
Creatinine, Ser: 8.97 mg/dL — ABNORMAL HIGH (ref 0.61–1.24)
GFR, Estimated: 7 mL/min — ABNORMAL LOW (ref >60)
Glucose, Bld: 232 mg/dL — ABNORMAL HIGH (ref 70–99)
Potassium: 4.9 mmol/L (ref 3.5–5.1)
Sodium: 137 mmol/L (ref 135–145)

## 2020-05-10 LAB — GLUCOSE, CAPILLARY
Glucose-Capillary: 182 mg/dL — ABNORMAL HIGH (ref 70–99)
Glucose-Capillary: 263 mg/dL — ABNORMAL HIGH (ref 70–99)

## 2020-05-10 LAB — HEMOGLOBIN A1C
Hgb A1c MFr Bld: 8.2 % — ABNORMAL HIGH (ref 4.8–5.6)
Mean Plasma Glucose: 188.64 mg/dL

## 2020-05-10 LAB — LIPID PANEL
Cholesterol: 128 mg/dL (ref 0–200)
HDL: 59 mg/dL (ref >40)
LDL Cholesterol: 56 mg/dL (ref 0–99)
Total CHOL/HDL Ratio: 2.2 RATIO
Triglycerides: 64 mg/dL (ref <150)
VLDL: 13 mg/dL (ref 0–40)

## 2020-05-10 LAB — HIV ANTIBODY (ROUTINE TESTING W REFLEX): HIV Screen 4th Generation wRfx: NONREACTIVE

## 2020-05-10 MED ORDER — CLONIDINE HCL 0.1 MG PO TABS
0.1000 mg | ORAL_TABLET | Freq: Every day | ORAL | Status: DC
  Administered 2020-05-10: 0.1 mg via ORAL
  Filled 2020-05-10: qty 1

## 2020-05-10 MED ORDER — AMLODIPINE BESYLATE 10 MG PO TABS
10.0000 mg | ORAL_TABLET | Freq: Every day | ORAL | Status: DC
  Administered 2020-05-10 – 2020-05-11 (×2): 10 mg via ORAL
  Filled 2020-05-10 (×2): qty 1

## 2020-05-10 MED ORDER — LIDOCAINE-PRILOCAINE 2.5-2.5 % EX CREA: 1.0000 "application " | TOPICAL_CREAM | CUTANEOUS | Status: DC | PRN

## 2020-05-10 MED ORDER — ALTEPLASE 2 MG IJ SOLR: 2.0000 mg | Freq: Once | INTRAMUSCULAR | Status: DC | PRN

## 2020-05-10 MED ORDER — HYDRALAZINE HCL 20 MG/ML IJ SOLN
10.0000 mg | Freq: Once | INTRAMUSCULAR | Status: AC
  Administered 2020-05-10: 10 mg via INTRAVENOUS
  Filled 2020-05-10: qty 1

## 2020-05-10 MED ORDER — PENTAFLUOROPROP-TETRAFLUOROETH EX AERO: 1.0000 "application " | INHALATION_SPRAY | CUTANEOUS | Status: DC | PRN

## 2020-05-10 MED ORDER — INSULIN ASPART 100 UNIT/ML IJ SOLN
0.0000 [IU] | Freq: Three times a day (TID) | INTRAMUSCULAR | Status: DC
  Administered 2020-05-10: 5 [IU] via SUBCUTANEOUS

## 2020-05-10 MED ORDER — CHLORHEXIDINE GLUCONATE CLOTH 2 % EX PADS
6.0000 | MEDICATED_PAD | Freq: Every day | CUTANEOUS | Status: DC
  Administered 2020-05-10 – 2020-05-11 (×2): 6 via TOPICAL

## 2020-05-10 MED ORDER — LIDOCAINE HCL (PF) 1 % IJ SOLN: 5.0000 mL | INTRAMUSCULAR | Status: DC | PRN

## 2020-05-10 MED ORDER — SODIUM CHLORIDE 0.9 % IV SOLN: 100.0000 mL | INTRAVENOUS | Status: DC | PRN

## 2020-05-10 MED ORDER — HEPARIN SODIUM (PORCINE) 1000 UNIT/ML DIALYSIS: 1000.0000 [IU] | INTRAMUSCULAR | Status: DC | PRN

## 2020-05-10 NOTE — Progress Notes (Signed)
SLP Cancellation Note  Patient Details Name: Jorge [name] MRN: [id number] DOB: 1970-05-13   Cancelled treatment:       Reason Eval/Treat Not Completed: SLP screened, no needs identified, will sign off; Pt denies current changes with cognitive linguistics. MRI of brain negative for acute intracranial abnormalities   Hayden Rasmussen. MA, [facility]   05/10/2020, 2:24 PM

## 2020-05-10 NOTE — Procedures (Signed)
Patient Name: Jorge [name]  MRN: [id number]  Epilepsy Attending: Lora Havens  Referring Physician/Provider: Dr Arizona Constable Date: 05/10/2020 Duration: 23.07 mins  Patient history: 50 year old with prior history of hypertensive ICH/IVH and SAHwas at dialysis with sudden onset of unresponsiveness followed by confusion and garbled speech and right-sided weakness. EEG to evaluate for seizure  Level of alertness: Awake, asleep  AEDs during EEG study: None  Technical aspects: This EEG study was done with scalp electrodes positioned according to the 10-20 International system of electrode placement. Electrical activity was acquired at a sampling rate of '500Hz'$  and reviewed with a high frequency filter of '70Hz'$  and a low frequency filter of '1Hz'$ . EEG data were recorded continuously and digitally stored.   Description: The posterior dominant rhythm consists of 7 Hz activity of moderate voltage (25-35 uV) seen predominantly in posterior head regions, symmetric and reactive to eye opening and eye closing.  Sleep was characterized by vertex waves, sleep spindles (12 to 14 Hz), maximal frontocentral region. EEG showed continuous generalized polymorphic and lateralized right temporal region 3 to 6 Hz theta-delta slowing. Hyperventilation and photic stimulation were not performed.     ABNORMALITY - Continuous slow, generalized and lateralized right temporal region  IMPRESSION: This study is suggestive of cortical dysfunction arising from right temporal region likely secondary to underlying structural abnormality/ stroke. There is also mild diffuse encephalopathy, nonspecific etiology. No seizures or epileptiform discharges were seen throughout the recording.   [name]

## 2020-05-10 NOTE — Assessment & Plan Note (Signed)
right temporal and external capsule hemorrhage.

## 2020-05-10 NOTE — Progress Notes (Addendum)
STROKE TEAM PROGRESS NOTE   INTERVAL HISTORY Presented yesterday to ED via EMS after approximately 5 minutes of right-sided weakness, garbled speech followed by disorientation during HD. MRI negative for stroke. Per chart review patient was not hypotensive or other wise unstable from a VS standpoint when this event occurred.   Today Jorge [name] is sitting up in bedside eating a hamburger with an excellent appetite. He reports he got up and walked around room today without difficulty. He denies any new symptoms of concern.  Patient reports he recalls going to sleep yesterday after he got hooked up to HD as he routinely does then awakening to a lot of people standing around and being sent to hospital. From his perspective nothing was wrong yesterday or today and this was all unnecessary.   We discussed concern for stroke with negative imaging/no stroke and his ongoing work up which is currently incomplete.   Vitals:   05/10/20 1130 05/10/20 1200 05/10/20 1230 05/10/20 1250  BP: (!) 151/70 (!) 165/87 (!) (P) 120/55 (!) (P) 145/74  Pulse: 76 74  (P) 78  Resp: (!) 21 11    Temp:      TempSrc:      SpO2:  100%  (P) 100%  Weight:    (P) 65.3 kg  Height:       CBC:  Recent Labs  Lab 05/09/20 1506 05/10/20 0639  WBC 5.5 5.5  NEUTROABS 2.1 2.8  HGB 10.1* 10.6*  HCT 32.3* 32.8*  MCV 90.0 87.2  PLT 89* 87*   Basic Metabolic Panel:  Recent Labs  Lab 05/09/20 1320 05/09/20 1414 05/10/20 0433  NA 134* 135 137  K 4.6 4.6 4.9  CL 97* 99 99  CO2 25  --  25  GLUCOSE 148* 147* 232*  BUN 34* 36* 40*  CREATININE 7.70* 8.60* 8.97*  CALCIUM 8.4*  --  8.9   Lipid Panel:  Recent Labs  Lab 05/10/20 0433  CHOL 128  TRIG 64  HDL 59  CHOLHDL 2.2  VLDL 13  LDLCALC 56   HgbA1c:  Recent Labs  Lab 05/10/20 0639  HGBA1C 8.2*   Urine Drug Screen: No results for input(s): LABOPIA, COCAINSCRNUR, LABBENZ, AMPHETMU, THCU, LABBARB in the last 168 hours.  Alcohol Level  Recent Labs  Lab  05/09/20 [place] <10   IMAGING MR Brain 1. No acute intracranial abnormality. 2. Normal intracranial MRA. 3. Remote right temporal and external capsule hemorrhage. 4. Numerous old cerebellar infarcts and chronic ischemic white matter disease.  MRA Brain 1. No acute intracranial abnormality. 2. Normal intracranial MRA. 3. Remote right temporal and external capsule hemorrhage. 4. Numerous old cerebellar infarcts and chronic ischemic white matter disease.  PHYSICAL EXAM  GENERAL: Alert, sitting up at bedside feeding self with no apparent difficulty or distress HEENT: - Normocephalic and atraumatic RESP: No extra work of breathing CV: RRR Ext: BKAs bilaterally with prosthetics in use   NEURO:  Mental Status: AA&Ox4, calm, cooperative Language: speech is clear.  Naming, repetition, fluency, and comprehension intact. Cranial Nerves: PERRL EOMI, visual fields full, face is symmetric, facial sensation intact, hearing intact, tongue/uvula/soft palate midline, normal sternocleidomastoid and trapezius muscle strength.  Motor: 5/5 bilat UE and grip strength.  Tone: is normal and bulk is normal Sensation- Intact to light touch bilaterally x 4 extremities  Coordination: FTN intact bilaterally Gait- deferred   ASSESSMENT/PLAN 50 year old with ESRD, gastroparesis, hypertension, ESRD on dialysis Monday Wednesday Friday, bilateral BKA's, prior history of hypertensive ICH/IVH and [facility]  at dialysis with sudden onset of unresponsiveness followed by confusion and garbled speech and right-sided weakness. Admitted for TIA work-up.  TIA vs. HD related hypoperfusion/hypotension  CT, MRI and MRA negative for acute finding  Carotid Doppler  result pending   2D Echo result pending  LDL 56  HgbA1c 8.2  VTE prophylaxis - per primary team   On ASA '81mg'$  PTA, continue ASA '81mg'$ . Will Defer DAPT due to hx of hypertensive hemorrhages and current apparent severe hypertension.    Therapy  recommendations:  Home   Disposition:  Home   Hypertension  Elevated BP to 200s, improving today  Avoid low BP . Long-term BP goal normotensive . Close follow up to ensure adequate blood pressure control  Hyperlipidemia  Home meds:  Lipitor '20mg'$    LDL 56, at goal < 70  Continue statin at discharge  Diabetes type II Uncontrolled  HgbA1c 8.2, goal < 7.0  CBGs Recent Labs    05/09/20 1317 05/10/20 0911  GLUCAP 146* 182*      Close follow up to improve blood glucose control  Other Stroke Risk Factors  Former Cigarette smoker  Previous Substance abuse - UDS: anuric   Family hx stroke (Brother  Other [facility] day # 0  This plan of care was directed by Dr. Erlinda Hong.   ATTENDING NOTE: I reviewed above note and agree with the assessment and plan. Pt was seen and examined.   50 yo M with hx of DM, HTN, ESRD on HD, b/l BKAs and ICH in 10/2019 admitted for AMS, not responding, difficulty talking, confusion, right sided weakness. Symptoms resolved in 5-10min.  CT no acute abnormality.  MRI no acute stroke, MRA head unremarkable.  EF 60 to 65%.  Carotid Doppler unremarkable.  LDL 56, A1c 8.2.  Platelet 82->89->87.  Etiology for patient symptoms concerning for TIA versus hemodialysis related event including hypoperfusion or hypotension.  Currently BP on the higher end, recommend to avoid hypotension during dialysis.  Currently on aspirin 81 and Lipitor 80.  Recommend against DAPT given history of ICH and current thrombocytopenia.  Continue Lipitor 80.  Neurology will sign off. Please call with questions. Pt will follow up with stroke clinic NP at [facility] in about 4 weeks. Thanks for the consult.   Rosalin Hawking, MD PhD Stroke Neurology 05/10/2020 7:14 PM     To contact Stroke Continuity provider, please refer to [email or web address] After hours, contact General Neurology

## 2020-05-10 NOTE — Progress Notes (Signed)
EEG Completed; Results Pending  

## 2020-05-10 NOTE — Progress Notes (Incomplete)
  Echocardiogram 2D Echocardiogram has been performed.  Jorge [name]  [name] 05/10/2020, 2:02 PM

## 2020-05-10 NOTE — Procedures (Addendum)
   I was present at this dialysis session, have reviewed the session itself and made  appropriate changes Kelly Splinter MD [place] pager [phone]   05/10/2020, 11:52 AM

## 2020-05-10 NOTE — Plan of Care (Signed)
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
## 2020-05-10 NOTE — Progress Notes (Signed)
OT Cancellation Note  Patient Details Name: JAVARIUS SLAVEY MRN: [id number] DOB: Apr 04, 1970   Cancelled Treatment:    Reason Eval/Treat Not Completed: Active bedrest order- pt on bed rest, OT will follow and see as appropriate/able as activity orders are updated.   Jolaine Artist, OT Acute Rehabilitation Services Pager [phone] Office [place] 05/10/2020, 8:08 AM

## 2020-05-10 NOTE — Evaluation (Signed)
Physical Therapy Evaluation Patient Details Name: Jorge [name] MRN: [id number] DOB: 1970/06/30 Today's Date: 05/10/2020   History of Present Illness  Pt is 50 yo admitted on 05/09/20 with concern for TIA.  Pt with symptoms of transient alteration of awareness at admission.  MRI negative and EEG pending. PMH is significant for T2DM with gastroparesis, subarachnoid hemorrhage, remote CVA, hypertension, Bil BKA, ESRD (MWF).  Clinical Impression   Pt admitted with above diagnosis. Pt is long term resident of [facility].  He reports he was able to ambulate on bil prosthetic with RW and was independent with ADLs.  Pt feels that he is at his baseline.  Today, he declined OOB activity due to recent return from HD, has been busy and wants to sleep, and just took off his prosthetics.  He demonstrated bed mobility easily and 5/5 strength throughout.  Pt was up to chair earlier today.  Suspect he is at his baseline mobility but will maintain on PT caseload to assess OOB activity.  Recommend return to facility with likely no PT needs.  Pt currently with functional limitations due to the deficits listed below (see PT Problem List). Pt will benefit from skilled PT to increase their independence and safety with mobility to allow discharge to the venue listed below.       Follow Up Recommendations Other (comment) (Return to [facility] (pt long term resident), likely no therapy needs)    Equipment Recommendations  None recommended by PT    Recommendations for Other Services       Precautions / Restrictions Precautions Precautions: Fall Precaution Comments: Bil BKA with prosthetics      Mobility  Bed Mobility Overal bed mobility: Independent Bed Mobility: Rolling;Supine to Sit;Sit to Supine Rolling: Independent   Supine to sit: Independent Sit to supine: Independent   General bed mobility comments: Performed easily.    Transfers                 General transfer comment: Pt declined  getting OOB due to just took off prosthesis and wants to take a nap.  States he was up to the chair earlier.  Ambulation/Gait                Stairs            Wheelchair Mobility    Modified Rankin (Stroke Patients Only)       Balance Overall balance assessment: Needs assistance   Sitting balance-Leahy Scale: Normal         Standing balance comment: not tested                             Pertinent Vitals/Pain Pain Assessment: No/denies pain    Home Living Family/patient expects to be discharged to:: Skilled nursing facility                 Additional Comments: bil prosthetics    Prior Function     Gait / Transfers Assistance Needed: Pt could ambulate with RW and bil prosthetics  ADL's / Homemaking Assistance Needed: Pt could perform ADLs        Hand Dominance        Extremity/Trunk Assessment   Upper Extremity Assessment Upper Extremity Assessment: Overall WFL for tasks assessed (ROM WFL; MMT 5/5)    Lower Extremity Assessment Lower Extremity Assessment: Overall WFL for tasks assessed (ROM WFL; MMT 5/5)    Cervical / Trunk Assessment Cervical / Trunk  Assessment: Normal  Communication      Cognition Arousal/Alertness: Awake/alert Behavior During Therapy: WFL for tasks assessed/performed Overall Cognitive Status: Within Functional Limits for tasks assessed                                 General Comments: Lethargic but arousable.  Frustrated about not being able to get any rest.      General Comments General comments (skin integrity, edema, etc.): Pt declining OOB activity.  He feels that everything is back to his normal and he had no difficulty when OOB earlier today.    Exercises     Assessment/Plan    PT Assessment Patient needs continued PT services  PT Problem List Decreased strength;Decreased mobility;Decreased activity tolerance;Decreased balance       PT Treatment Interventions DME  instruction;Therapeutic activities;Gait training;Therapeutic exercise;Patient/family education;Balance training;Functional mobility training    PT Goals (Current goals can be found in the Care Plan section)  Acute Rehab PT Goals Patient Stated Goal: return to [facility]; get some rest PT Goal Formulation: With patient Time For Goal Achievement: 05/24/20 Potential to Achieve Goals: Good    Frequency Min 2X/week   Barriers to discharge        Co-evaluation               AM-PAC PT "6 Clicks" Mobility  Outcome Measure Help needed turning from your back to your side while in a flat bed without using bedrails?: None Help needed moving from lying on your back to sitting on the side of a flat bed without using bedrails?: None Help needed moving to and from a bed to a chair (including a wheelchair)?: None Help needed standing up from a chair using your arms (e.g., wheelchair or bedside chair)?: A Little Help needed to walk in hospital room?: A Little Help needed climbing 3-5 steps with a railing? : A Little 6 Click Score: 21    End of Session   Activity Tolerance: Other (comment) (self limiting/poor timing (after HD, pt just removed prosthesis)) Patient left: in bed;with call bell/phone within reach;with bed alarm set Nurse Communication: Mobility status PT Visit Diagnosis: Other abnormalities of gait and mobility (R26.89)    [id number] PT Time Calculation (min) (ACUTE ONLY): 13 min   Charges:   PT Evaluation $PT Eval Low Complexity: 1 Low          Anette Barra, PT Acute Rehab Services Pager [phone] [facility] [phone]    Karlton Lemon 05/10/2020, 4:58 PM

## 2020-05-10 NOTE — Hospital Course (Addendum)
Jorge [name] is a 50 y.o. male presented with transient alteration of awareness and admitted for TIA work-up.PMH is significant for T2DM with gastroparesis, subarachnoid hemorrhage, remote CVA, hypertension, BKA, ESRD (MWF).   Transient alteration of awareness  TIA vs HD related to hypoperfusion/hypotension Patient experienced sudden alteration of awareness on 5/2 while in dialysis followed by several minutes of confusion.  On presentation to ED patient alert and oriented with normal neuro and physical exam . CT Head showed chronic microvascular ischemic changes without acute changes. Neurology consulted and they completed TIA work-up.  MR head wo contrast and MRA did not show any acute intracranial abnormality.  Carotid Doppler does not show any significant stenosis and normal blood flow.  EEG is negative for any epileptiform activity.  Echocardiogram shows HFpEF with left ventricular ejection fraction, by estimation 60% to 65%patient allowed permissive hypertension for first 24 hours SBP<220 and DBP<110 and then received hydralazine 10 mg elevated blood pressure and then restarted on home medication Norvasc 10 mg daily, clonidine 0.1 mg at bedtime and Coreg 12.5 mg twice daily.  Patient also started on ASA Daily, atorvastatin 80 mg.   ESRD on HD MWF Presented from dialysis unit.  Received around 1.5 hours of dialysis on 05/09/2020.  Dry weight 68 kg.  Nephrology consulted and patient received HD on 05/10/2020 as inpatient.    Type 2 DM complicated with gastroparesis A1c 8.2.Home medication notable for Lantus 10 units daily, Novolog 5 units TID.  While inpatient patient was on CBG monitoring sensitive SSI scale.

## 2020-05-10 NOTE — Progress Notes (Signed)
Family Medicine Teaching Service Daily Progress Note Intern Pager: [phone]  Patient name: Jorge [name] Medical record number: [id number] Date of birth: 19-Mar-1970 Age: 50 y.o. Gender: male  Primary Care Provider: Lauree Chandler, NP Consultants: Neurology, Nephrology Code Status: Full  Pt Overview and Major Events to Date:  5/2-admitted 5/3-HD  Assessment and Plan: Jorge [name] is a 50 year old male presented with transient alteration of awareness and admitted with concern for TIA.  PMH is significant for T2DM with gastroparesis, subarachnoid hemorrhage, remote CVA, hypertension, BKA, ESRD (MWF).  Transient alteration of awareness/ ? TIA Patient was difficult to wake this morning but was interactive later in the interview.  MR brain wo contrast and MR angio head wo contrast did not show any acute intracranial abnormality.  EEG not performed this morning due to patient's unavailability, was a HD.  Still concerns for TIA vs seizures vs hypertensive encephalopathy vs disequilibrium syndrome. HbA1c 8.2, glucose 232.  Carotid duplex bilateral does not show significant stenosis and demonstrate bilateral vertebral antegrade flow. -- f/u EEG -- f/u Echocardiogram -- Frequent neuro-checks -- ASA daily -- Tylenol 650 prn for pain -- Renal diet, passed swallow test yesterday -- Atorvastatin 80 mg -- AM CBC, CMP -- Neurology following, appreciate recommendations   ESRD on HD MWF Patient unable to complete HD yesterday due to altered awareness.  Nephrology consulted and appreciate their help with hemodialysis today.  Patient had elevated blood pressure SBP>220 this morning and received 10 mg hydralazine and restarted on home medications.  Home medications were held due to permissive hypertension.  Blood pressure normalizing now. -- Nephrology following, appreciate nephrology recommendations -- Start amlodipine 10 mg daily -- Continue Coreg 12.5 mg twice daily -- Start clonidine 0.1 mg  QHS -- Continue monitor BP   Type 2 DM complicated with gastroparesis Last A1c 4.9, 7 months ago, down from 7.26-monthprior to that.  Home medication Lantus 10 units daily, NovoLog 5 units TID --CBG monitoring --SSI scale --Renal/Carb modified diet   Anemia of chronic disease Hgb 10.1.  Baseline seems to be around 8-9 -Monitor with CBC  Previous history of C.diff On admission in 10/2019.  Completed vancomycin for treatment at that time.  Reports chronic diarrhea and takes loperamide at home. -Monitor while inpatient  FEN/GI: Renal/carb modified diet PPx: SCD   Status is: Observation  The patient will require care spanning > 2 midnights and should be moved to inpatient because: Ongoing active pain requiring inpatient pain management  [name]: The patient is from: Group home              Anticipated d/c is to: Group home              Patient currently is not medically stable to d/c.   Difficult to place patient No        Subjective:  No acute overnight events. Patient states that if he did not have any stroke and he should be able to go back to his group home soon.  He states he would like to go back to his outpatient dialysis tomorrow.  Denies any new complaints.  Objective: Temp:  [97.6 F (36.4 C)-98 F (36.7 C)] 97.8 F (36.6 C) (05/03 0923) Pulse Rate:  [67-79] 79 (05/03 1354) Resp:  [11-21] 20 (05/03 1354) BP: (116-234)/(65-122) 116/74 (05/03 1354) SpO2:  [96 %-100 %] (P) 100 % (05/03 1250) Weight:  [143 lb 15.4 oz (65.3 kg)-156 lb 1.4 oz (70.8 kg)] (P) 143 lb 15.4 oz (65.3 kg) (  05/03 1250) Physical Exam: General: Stuporous but alert later in the interview, lying comfortably in bed, NAD Cardiovascular: RRR Respiratory: CTAB Abdomen: Soft, nondistended, nontender Extremities: Bilateral BKA, warm and dry Neuro: Alert and oriented.  No facial asymmetry, facial sensation intact, hearing intact, tongue midline.  Normal motor strength in all extremities, normal  bulk.   Laboratory: Recent Labs  Lab 05/09/20 1320 05/09/20 1414 05/09/20 1506 05/10/20 0639  WBC 5.2  --  5.5 5.5  HGB 10.2* 10.9* 10.1* 10.6*  HCT 33.0* 32.0* 32.3* 32.8*  PLT 82*  --  89* 87*   Recent Labs  Lab 05/09/20 1320 05/09/20 1414 05/10/20 0433  NA 134* 135 137  K 4.6 4.6 4.9  CL 97* 99 99  CO2 25  --  25  BUN 34* 36* 40*  CREATININE 7.70* 8.60* 8.97*  CALCIUM 8.4*  --  8.9  PROT 7.2  --   --   BILITOT 0.5  --   --   ALKPHOS 96  --   --   ALT 11  --   --   AST 14*  --   --   GLUCOSE 148* 147* 232*    Imaging/Diagnostic Tests: CT HEAD WO CONTRAST  Result Date: 05/09/2020 CLINICAL DATA:  Acute neuro deficit.  Rule out stroke EXAM: CT HEAD WITHOUT CONTRAST TECHNIQUE: Contiguous axial images were obtained from the base of the skull through the vertex without intravenous contrast. COMPARISON:  12/15/2019 FINDINGS: Brain: Generalized atrophy. Patchy white matter hypodensity bilaterally, unchanged. Negative for acute cortical infarct, acute hemorrhage, or mass lesion. Vascular: Negative for hyperdense vessel Skull: Negative Sinuses/Orbits: Mild mucosal edema paranasal sinuses. Negative orbit Other: None IMPRESSION: Mild atrophy and chronic microvascular ischemic change in the white matter. No acute abnormality. Electronically Signed   By: Franchot Gallo M.D.   On: 05/09/2020 15:40   MR ANGIO HEAD WO CONTRAST  Result Date: 05/10/2020 CLINICAL DATA:  Transient ischemic attack EXAM: MRI HEAD WITHOUT CONTRAST MRA HEAD WITHOUT CONTRAST TECHNIQUE: Multiplanar, multiecho pulse sequences of the brain and surrounding structures were obtained without intravenous contrast. Angiographic images of the head were obtained using MRA technique without contrast. COMPARISON:  Head CT 05/09/2020 FINDINGS: MRI HEAD FINDINGS Brain: No acute infarct, mass effect or extra-axial collection. Remote right temporal and external capsule hemorrhage. No acute hemorrhage. There is multifocal hyperintense  T2-weighted signal within the white matter. Generalized volume loss without a clear lobar predilection. Old left frontal infarct. There are numerous old cerebellar infarcts, unchanged. The midline structures are normal. Vascular: Major flow voids are preserved. Skull and upper cervical spine: Normal calvarium and skull base. Visualized upper cervical spine and soft tissues are normal. Sinuses/Orbits:No paranasal sinus fluid levels or advanced mucosal thickening. No mastoid or middle ear effusion. Normal orbits. MRA HEAD FINDINGS POSTERIOR CIRCULATION: --Vertebral arteries: Normal --Inferior cerebellar arteries: Normal. --Basilar artery: Normal. --Superior cerebellar arteries: Normal. --Posterior cerebral arteries: Normal. Both are predominantly supplied by the posterior communicating arteries (p-comm). ANTERIOR CIRCULATION: --Intracranial internal carotid arteries: Normal. --Anterior cerebral arteries (ACA): Normal. --Middle cerebral arteries (MCA): Normal. ANATOMIC VARIANTS: Bilateral fetal origins of the posterior cerebral arteries. IMPRESSION: 1. No acute intracranial abnormality. 2. Normal intracranial MRA. 3. Remote right temporal and external capsule hemorrhage. 4. Numerous old cerebellar infarcts and chronic ischemic white matter disease. Electronically Signed   By: Ulyses Jarred M.D.   On: 05/10/2020 03:29   MR BRAIN WO CONTRAST  Result Date: 05/10/2020 CLINICAL DATA:  Transient ischemic attack EXAM: MRI HEAD WITHOUT CONTRAST MRA HEAD  WITHOUT CONTRAST TECHNIQUE: Multiplanar, multiecho pulse sequences of the brain and surrounding structures were obtained without intravenous contrast. Angiographic images of the head were obtained using MRA technique without contrast. COMPARISON:  Head CT 05/09/2020 FINDINGS: MRI HEAD FINDINGS Brain: No acute infarct, mass effect or extra-axial collection. Remote right temporal and external capsule hemorrhage. No acute hemorrhage. There is multifocal hyperintense T2-weighted  signal within the white matter. Generalized volume loss without a clear lobar predilection. Old left frontal infarct. There are numerous old cerebellar infarcts, unchanged. The midline structures are normal. Vascular: Major flow voids are preserved. Skull and upper cervical spine: Normal calvarium and skull base. Visualized upper cervical spine and soft tissues are normal. Sinuses/Orbits:No paranasal sinus fluid levels or advanced mucosal thickening. No mastoid or middle ear effusion. Normal orbits. MRA HEAD FINDINGS POSTERIOR CIRCULATION: --Vertebral arteries: Normal --Inferior cerebellar arteries: Normal. --Basilar artery: Normal. --Superior cerebellar arteries: Normal. --Posterior cerebral arteries: Normal. Both are predominantly supplied by the posterior communicating arteries (p-comm). ANTERIOR CIRCULATION: --Intracranial internal carotid arteries: Normal. --Anterior cerebral arteries (ACA): Normal. --Middle cerebral arteries (MCA): Normal. ANATOMIC VARIANTS: Bilateral fetal origins of the posterior cerebral arteries. IMPRESSION: 1. No acute intracranial abnormality. 2. Normal intracranial MRA. 3. Remote right temporal and external capsule hemorrhage. 4. Numerous old cerebellar infarcts and chronic ischemic white matter disease. Electronically Signed   By: Ulyses Jarred M.D.   On: 05/10/2020 03:29   VAS US CAROTID  Result Date: 05/10/2020 Carotid Arterial Duplex Study Patient Name:  LATERRIAN MCKERNAN  Date of Exam:   05/10/2020 Medical Rec #: [id number]       Accession #:    [id number] Date of Birth: 28-Oct-1970       Patient Gender: M Patient Age:   49Y Exam Location:  [facility] Procedure:      VAS US CAROTID Referring Phys: [id number] TODD D MCDIARMID --------------------------------------------------------------------------------  Risk Factors:      Hypertension, Diabetes. Comparison Study:  no prior Performing Technologist: Abram Sander RVS  Examination Guidelines: A complete evaluation includes B-mode  imaging, spectral Doppler, color Doppler, and power Doppler as needed of all accessible portions of each vessel. Bilateral testing is considered an integral part of a complete examination. Limited examinations for reoccurring indications may be performed as noted.  Right Carotid Findings: +----------+--------+--------+--------+------------------+--------+           PSV cm/sEDV cm/sStenosisPlaque DescriptionComments +----------+--------+--------+--------+------------------+--------+ CCA Prox  80      6               heterogenous               +----------+--------+--------+--------+------------------+--------+ CCA Distal67                      heterogenous               +----------+--------+--------+--------+------------------+--------+ ICA Prox  71      16      1-39%   heterogenous               +----------+--------+--------+--------+------------------+--------+ ICA Distal74      19                                         +----------+--------+--------+--------+------------------+--------+ ECA       71                                                 +----------+--------+--------+--------+------------------+--------+ +----------+--------+-------+--------+-------------------+  PSV cm/sEDV cmsDescribeArm Pressure (mmHG) +----------+--------+-------+--------+-------------------+ [id number]                                        +----------+--------+-------+--------+-------------------+ +---------+--------+--+--------+---------+ VertebralPSV cm/s53EDV cm/sAntegrade +---------+--------+--+--------+---------+  Left Carotid Findings: +----------+--------+--------+--------+------------------+--------+           PSV cm/sEDV cm/sStenosisPlaque DescriptionComments +----------+--------+--------+--------+------------------+--------+ CCA Prox  79      11              heterogenous                +----------+--------+--------+--------+------------------+--------+ CCA Distal189     22              heterogenous               +----------+--------+--------+--------+------------------+--------+ ICA Prox  63      17      1-39%   heterogenous               +----------+--------+--------+--------+------------------+--------+ ICA Distal70      18                                         +----------+--------+--------+--------+------------------+--------+ ECA       70      12                                         +----------+--------+--------+--------+------------------+--------+ +----------+--------+--------+--------+-------------------+           PSV cm/sEDV cm/sDescribeArm Pressure (mmHG) +----------+--------+--------+--------+-------------------+ [id number]                                         +----------+--------+--------+--------+-------------------+ +---------+--------+--+--------+---------+ VertebralPSV cm/s58EDV cm/sAntegrade +---------+--------+--+--------+---------+   Summary: Right Carotid: Velocities in the right ICA are consistent with a 1-39% stenosis. Left Carotid: Velocities in the left ICA are consistent with a 1-39% stenosis. Vertebrals: Bilateral vertebral arteries demonstrate antegrade flow. *See table(s) above for measurements and observations.     Preliminary     Arlin Savona, Meredith Staggers, MD 05/10/2020, 2:28 PM PGY-1, [place] Intern pager: [phone], text pages welcome

## 2020-05-10 NOTE — NC FL2 (Signed)
[place] LEVEL OF CARE SCREENING TOOL     IDENTIFICATION  Patient Name: Jorge [name] Birthdate: 03-14-1970 Sex: male Admission Date (Current Location): 05/09/2020  [facility] and [place] Number:  Herbalist and Address:  [facility] [place], [place] [address] [place], [place] [address]      Provider Number: [id number]  Attending Physician Name and Address:  McDiarmid, Blane Ohara, MD  Relative Name and Phone Number:       Current Level of Care: Hospital Recommended Level of Care: [place] Prior Approval Number:    Date Approved/Denied:   PASRR Number:    Discharge Plan: SNF    Current Diagnoses: Patient Active Problem List   Diagnosis Date Noted  . Stupor 05/10/2020  . TIA (transient ischemic attack) 05/09/2020  . ICH (intracerebral hemorrhage) ([place]) 10/03/2019  . Personal history of anaphylaxis 08/28/2019  . Coagulation defect, unspecified ([place]) 05/05/2019  . Gastroparesis due to DM ([place]) 04/28/2019  . Labile blood glucose   . Anemia of chronic disease   . S/P BKA (below knee amputation) bilateral ([place]) 03/31/2019  . ESRD on peritoneal dialysis ([place])   . ESRD (end stage renal disease) on dialysis ([place]) 03/19/2019  . Hypertension associated with diabetes ([place])   . Type 2 diabetes mellitus with chronic kidney disease on chronic dialysis, with long-term current use of insulin ([place])   . Encounter for adequacy testing for peritoneal dialysis ([place]) 01/10/2019  . Other disorders of bilirubin metabolism 01/10/2019  . 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  . Other disorders resulting from impaired renal tubular function 07/05/2016  . Secondary hyperparathyroidism of renal origin ([place]) 07/05/2016    Orientation RESPIRATION BLADDER Height & Weight     Self,Time,Situation,Place  Normal Continent Weight: 149 lb 14.6 oz (68 kg) Height:  '5\' 10"'$  (177.8 cm)  BEHAVIORAL SYMPTOMS/MOOD NEUROLOGICAL BOWEL NUTRITION STATUS      Continent  Diet (see DC summary)  AMBULATORY STATUS COMMUNICATION OF NEEDS Skin   Limited Assist Verbally Normal                       Personal Care Assistance Level of Assistance  Bathing,Feeding,Dressing Bathing Assistance: Limited assistance Feeding assistance: Limited assistance Dressing Assistance: Limited assistance     Functional Limitations Info             SPECIAL CARE FACTORS FREQUENCY                       Contractures Contractures Info: Not present    Additional Factors Info  Code Status,Allergies,Insulin Sliding Scale Code Status Info: Full Allergies Info: Lactose Intolerance (Gi)   Insulin Sliding Scale Info: see DC summary       Current Medications (05/10/2020):  This is the current hospital active medication list Current Facility-Administered Medications  Medication Dose Route Frequency Provider Last Rate Last Admin  . 0.9 %  sodium chloride infusion  100 mL Intravenous PRN Ejigiri, Thomos Lemons, PA-C      . 0.9 %  sodium chloride infusion  100 mL Intravenous PRN Ejigiri, Thomos Lemons, PA-C      . acetaminophen (TYLENOL) tablet 650 mg  650 mg Oral Q4H PRN Shary Key, DO       Or  . acetaminophen (TYLENOL) 160 MG/5ML solution 650 mg  650 mg Per Tube Q4H PRN Shary Key, DO       Or  . acetaminophen (TYLENOL) suppository 650 mg  650 mg Rectal Q4H PRN Shary Key, DO      . alteplase (CATHFLO ACTIVASE) injection 2 mg  2 mg Intracatheter Once PRN Lynnda Child, PA-C      . amLODipine (NORVASC) tablet 10 mg  10 mg Oral Daily Carollee Leitz, MD      . aspirin EC tablet 81 mg  81 mg Oral Daily Meccariello, Bailey J, DO      . atorvastatin (LIPITOR) tablet 80 mg  80 mg Oral Daily Meccariello, Bernita Raisin, DO      . carvedilol (COREG) tablet 12.5 mg  12.5 mg Oral BID WC Paige, Victoria J, DO   12.5 mg at 05/10/20 [id number]  . Chlorhexidine Gluconate Cloth 2 % PADS 6 each  6 each Topical Daily McDiarmid, Blane Ohara, MD      . Chlorhexidine Gluconate  Cloth 2 % PADS 6 each  6 each Topical Q0600 Lynnda Child, PA-C      . cloNIDine (CATAPRES) tablet 0.1 mg  0.1 mg Oral QHS Carollee Leitz, MD      . heparin injection 1,000 Units  1,000 Units Dialysis PRN Lynnda Child, PA-C      . insulin aspart (novoLOG) injection 0-9 Units  0-9 Units Subcutaneous TID WC Dagar, Anjali, MD      . lidocaine (PF) (XYLOCAINE) 1 % injection 5 mL  5 mL Intradermal PRN Lynnda Child, PA-C      . lidocaine-prilocaine (EMLA) cream 1 application  1 application Topical PRN Ejigiri, Thomos Lemons, PA-C      . pentafluoroprop-tetrafluoroeth (GEBAUERS) aerosol 1 application  1 application Topical PRN Lynnda Child, PA-C         Discharge Medications: Please see discharge summary for a list of discharge medications.  Relevant Imaging Results:  Relevant Lab Results:   Additional Information SS#: [id number]  Geralynn Ochs, LCSW

## 2020-05-10 NOTE — Progress Notes (Signed)
Carotid duplex has been completed.   Preliminary results in CV Proc.   Abram Sander 05/10/2020 2:14 PM

## 2020-05-10 NOTE — Progress Notes (Signed)
Patient going to dialysis at this time- will attempt again as schedule permits.

## 2020-05-10 NOTE — Progress Notes (Signed)
Renal Navigator spoke with patient at HD bedside. Patient known to Navigator from previous hospitalizations. Patient is pleasant and talkative today. He states he likes it at [facility] and has been doing well. He thinks he has been "getting healthy" there, though notes that they do not always follow his renal diet, which concerns him. Navigator suggests that he discuss this with his Dietician at his outpatient HD clinic, who might be able to call his SNF to talk with the Dietician there. He agrees.  Patient is eager to be discharged and states he would prefers to go straight to his outpatient HD clinic[facility] tomorrow, if he is not going to be discharged later today. Navigator to evaluate, explaining to patient that Navigator is unaware when discharge is set to occur. Per CSW, patient is able to return to SNF whenever he is discharged, and SNF is able to pick him up from HD clinic if he is discharged to clinic tomorrow.  Navigator spoke with Family Medicine Resident to discuss situation and ask that Navigator be called by 10:00am tomorrow, Wednesday 5/4, with an update. Resident states there is a possibility for discharge tomorrow pending test results and clearance by Neurology. If he is able to be discharged, Navigator/CSW will arrange [facility] Transportation straight to [facility] HD clinic. He needs to arrive at 11:00am at the latest. If a decision regarding discharge cannot be made by 10:00am, we will keep him in the hospital for dialysis. Nephrology aware of this plan and agreeable.  Navigator to follow closely and appreciates team collaboration.  Alphonzo Cruise, [place] Renal Navigator [phone]

## 2020-05-10 NOTE — Care Management Obs Status (Signed)
[place] NOTIFICATION   Patient Details  Name: ZEDRICK WHISLER MRN: [id number] Date of Birth: November 15, 1970   Medicare Observation Status Notification Given:  Yes    Geralynn Ochs, LCSW 05/10/2020, 3:49 PM

## 2020-05-10 NOTE — TOC Initial Note (Signed)
Transition of Care [facility]) - Initial/Assessment Note    Patient Details  Name: Jorge [name] MRN: [id number] Date of Birth: 1970-06-01  [facility] CM/SW Contact:    Geralynn Ochs, LCSW Phone Number: 05/10/2020, 10:29 AM  Clinical Narrative:    Patient is long term at [facility], and CSW confirmed that plan is for patient to return. CSW confirmed with [facility] that they can take him back whenever he is medically stable. CSW to follow.               Expected Discharge Plan: [place] Barriers to Discharge: Continued Medical Work up   Patient Goals and CMS Choice Patient states their goals for this hospitalization and ongoing recovery are:: get out of the hospital, back to [place] Medicare.gov Compare Post Acute Care list provided to:: Patient Choice offered to / list presented to : Patient  Expected Discharge Plan and Services Expected Discharge Plan: [place] Choice: [place] Living arrangements for the past 2 months: [place]                                      Prior Living Arrangements/Services Living arrangements for the past 2 months: [place] Lives with:: Facility Resident Patient language and need for interpreter reviewed:: No Do you feel safe going back to the place where you live?: Yes      Need for Family Participation in Patient Care: No (Comment) Care giver support system in place?: Yes (comment)   Criminal Activity/Legal Involvement Pertinent to Current Situation/Hospitalization: No - Comment as needed  Activities of Daily Living Home Assistive Devices/Equipment: Wheelchair ADL Screening (condition at time of admission) Patient's cognitive ability adequate to safely complete daily activities?: Yes Is the patient deaf or have difficulty hearing?: No Does the patient have difficulty seeing, even when wearing glasses/contacts?:  No Does the patient have difficulty concentrating, remembering, or making decisions?: No Patient able to express need for assistance with ADLs?: Yes Does the patient have difficulty dressing or bathing?: No Independently performs ADLs?: No Does the patient have difficulty walking or climbing stairs?: Yes Weakness of Legs: Both Weakness of Arms/Hands: None  Permission Sought/Granted Permission sought to share information with : Chartered certified accountant granted to share information with : Yes, Verbal Permission Granted     Permission granted to share info w AGENCY: [facility]        Emotional Assessment Appearance:: Appears stated age Attitude/Demeanor/Rapport: Engaged Affect (typically observed): Appropriate Orientation: : Oriented to Self,Oriented to Place,Oriented to Situation,Oriented to  Time Alcohol / Substance Use: Not Applicable Psych Involvement: No (comment)  Admission diagnosis:  TIA (transient ischemic attack) [G45.9] Patient Active Problem List   Diagnosis Date Noted  . Stupor 05/10/2020  . TIA (transient ischemic attack) 05/09/2020  . ICH (intracerebral hemorrhage) ([place]) 10/03/2019  . Personal history of anaphylaxis 08/28/2019  . Coagulation defect, unspecified ([place]) 05/05/2019  . Gastroparesis due to DM ([place]) 04/28/2019  . Labile blood glucose   . Anemia of chronic disease   . S/P BKA (below knee amputation) bilateral ([place]) 03/31/2019  . ESRD on peritoneal dialysis ([place])   . ESRD (end stage renal disease) on dialysis ([place]) 03/19/2019  . Hypertension associated with diabetes ([place])   . Type 2 diabetes mellitus with chronic kidney disease on chronic dialysis, with long-term current use of  insulin ([place])   . Encounter for adequacy testing for peritoneal dialysis ([place]) 01/10/2019  . Other disorders of bilirubin metabolism 01/10/2019  . 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  . Other disorders resulting from impaired renal tubular function 07/05/2016  .  Secondary hyperparathyroidism of renal origin ([place]) 07/05/2016   PCP:  Lauree Chandler, NP Pharmacy:   [facility] [place] [address] [place] [address] Phone: [phone] Fax: [phone]  [facility] [id number] - [place], [place] [address] [place] [address] Phone: [phone] Fax: [phone]  [facility] [address] [address] Phone: [phone] Fax: [phone]     Social Determinants of Health (SDOH) Interventions    Readmission Risk Interventions No flowsheet data found.

## 2020-05-11 DIAGNOSIS — E1169 Type 2 diabetes mellitus with other specified complication: Secondary | ICD-10-CM

## 2020-05-11 DIAGNOSIS — E785 Hyperlipidemia, unspecified: Secondary | ICD-10-CM

## 2020-05-11 DIAGNOSIS — R4182 Altered mental status, unspecified: Secondary | ICD-10-CM | POA: Diagnosis not present

## 2020-05-11 LAB — BASIC METABOLIC PANEL
Anion gap: 11 (ref 5–15)
BUN: 27 mg/dL — ABNORMAL HIGH (ref 6–20)
CO2: 28 mmol/L (ref 22–32)
Calcium: 8.9 mg/dL (ref 8.9–10.3)
Chloride: 96 mmol/L — ABNORMAL LOW (ref 98–111)
Creatinine, Ser: 6.46 mg/dL — ABNORMAL HIGH (ref 0.61–1.24)
GFR, Estimated: 10 mL/min — ABNORMAL LOW (ref >60)
Glucose, Bld: 269 mg/dL — ABNORMAL HIGH (ref 70–99)
Potassium: 4.2 mmol/L (ref 3.5–5.1)
Sodium: 135 mmol/L (ref 135–145)

## 2020-05-11 LAB — CBC
HCT: 33 % — ABNORMAL LOW (ref 39.0–52.0)
Hemoglobin: 10.5 g/dL — ABNORMAL LOW (ref 13.0–17.0)
MCH: 28.1 pg (ref 26.0–34.0)
MCHC: 31.8 g/dL (ref 30.0–36.0)
MCV: 88.2 fL (ref 80.0–100.0)
Platelets: 87 10*3/uL — ABNORMAL LOW (ref 150–400)
RBC: 3.74 MIL/uL — ABNORMAL LOW (ref 4.22–5.81)
RDW: 18 % — ABNORMAL HIGH (ref 11.5–15.5)
WBC: 5.7 10*3/uL (ref 4.0–10.5)
nRBC: 0 % (ref 0.0–0.2)

## 2020-05-11 MED ORDER — ATORVASTATIN CALCIUM 80 MG PO TABS: 80.0000 mg | ORAL_TABLET | Freq: Every day | ORAL | 0 refills | Status: DC

## 2020-05-11 NOTE — Discharge Instructions (Signed)
Dear Jorge [name],  You presented after sudden loss of your awareness in dialysis and we did work-up for stroke. Imaging studies does not show any acute stroke.  You were informed that it might be due to low blood pressure during dialysis.  All medications are continued same except Lipitor increased to 80 mg daily.  Please see neurology in 4 weeks for your follow-up appointment. Please see your PCP about her blood pressure medication adjustment, if needed for your days of dialysis.  It was pleasure taking care of you! Dr. Demaris Callander

## 2020-05-11 NOTE — Progress Notes (Signed)
Patient to be D/C'd to [facility] per MD order.  Discussed with the patient and all questions fully answered.   VSS, Skin clean, dry and intact without evidence of skin break down, no evidence of skin tears noted. IV catheter discontinued intact. Site without signs and symptoms of complications. Dressing and pressure applied.   An After Visit Summary was printed and given to the patient. Patient received prescription.   D/c education completed with patient/family including follow up instructions, medication list, d/c activities limitations if indicated, with other d/c instructions as indicated by MD - patient able to verbalize understanding, all questions fully answered.    Patient instructed to return to ED, call 911, or call MD for any changes in condition.    Patient escorted via WC, and D/C  via transportation arrangement.   Thane Edu RN

## 2020-05-11 NOTE — Discharge Summary (Signed)
[facility] Discharge Summary  Patient name: Jorge [name] Medical record number: [id number] Date of birth: May 23, 1970 Age: 50 y.o. Gender: male Date of Admission: 05/09/2020  Date of Discharge: 05/11/2020 Admitting Physician: Blane Ohara McDiarmid, MD  Primary Care Provider: Lauree Chandler, NP Consultants: Nephrology, neurology  Indication for Hospitalization: Concern for TIA vs HD related hypoperfusion/hypotension  Discharge Diagnoses/Problem List:  1.  TIA vs HD realted to hypoperfusion/hypotension 2.  ESRD on HD MWF 3.  Type II DM complicated with gastroparesis  Disposition: [facility]  Discharge Condition: Stable  Discharge Exam:  General: Pleasant, cooperative, sitting comfortably in bed, NAD Cardiovascular: RRR Respiratory: Clear to auscultation bilaterally Abdomen: Soft, nondistended, nontender Extremities: Warm and dry, bilateral BKA Neuro: Alert, oriented to time place and person, awake.  Normal motor strength normal sensation Psych: Normal mood and affect  Brief Hospital Course:  Jorge [name] is a 50 y.o. male presented with transient alteration of awareness and admitted for TIA work-up.PMH is significant for T2DM with gastroparesis, subarachnoid hemorrhage, remote CVA, hypertension, BKA, ESRD (MWF).   Transient alteration of awareness  TIA vs HD related to hypoperfusion/hypotension Patient experienced sudden alteration of awareness on 5/2 while in dialysis followed by several minutes of confusion.  On presentation to ED patient alert and oriented with normal neuro and physical exam . CT Head showed chronic microvascular ischemic changes without acute changes. Neurology consulted and they completed TIA work-up.  MR head wo contrast and MRA did not show any acute intracranial abnormality.  Carotid Doppler does not show any significant stenosis and normal blood flow.  EEG is negative for any epileptiform activity.  Echocardiogram shows HFpEF  with left ventricular ejection fraction, by estimation 60% to 65%patient allowed permissive hypertension for first 24 hours SBP<220 and DBP<110 and then received hydralazine 10 mg elevated blood pressure and then restarted on home medication Norvasc 10 mg daily, clonidine 0.1 mg at bedtime and Coreg 12.5 mg twice daily.  Patient also started on ASA Daily, atorvastatin 80 mg.   ESRD on HD MWF Presented from dialysis unit.  Received around 1.5 hours of dialysis on 05/09/2020.  Dry weight 68 kg.  Nephrology consulted and patient received HD on 05/10/2020 as inpatient.    Type 2 DM complicated with gastroparesis A1c 8.2.Home medication notable for Lantus 10 units daily, Novolog 5 units TID.  While inpatient patient was on CBG monitoring sensitive SSI scale.      Issues for Follow Up:  1. Follow-up with stroke clinic 2. Adjustment of BP medication on HD days (MWF) for avoidance of hypoperfusion/hypotension  Significant Procedures: EEG adult  Result Date: 05/10/2020 Jorge Havens, MD     05/10/2020  5:34 PM Patient Name: Jorge [name] MRN: [id number] Epilepsy Attending: Lora [name] Referring Physician/Provider: Dr Arizona Constable Date: 05/10/2020 Duration: 23.07 mins Patient history: 50 year old with prior history of hypertensive ICH/IVH and SAHwas at dialysis with sudden onset of unresponsiveness followed by confusion and garbled speech and right-sided weakness. EEG to evaluate for seizure Level of alertness: Awake, asleep AEDs during EEG study: None Technical aspects: This EEG study was done with scalp electrodes positioned according to the 10-20 International system of electrode placement. Electrical activity was acquired at a sampling rate of '500Hz'$  and reviewed with a high frequency filter of '70Hz'$  and a low frequency filter of '1Hz'$ . EEG data were recorded continuously and digitally stored. Description: The posterior dominant rhythm consists of 7 Hz activity of moderate voltage (25-35 uV) seen  predominantly in posterior head regions, symmetric and reactive to eye opening and eye closing.  Sleep was characterized by vertex waves, sleep spindles (12 to 14 Hz), maximal frontocentral region. EEG showed continuous generalized polymorphic and lateralized right temporal region 3 to 6 Hz theta-delta slowing. Hyperventilation and photic stimulation were not performed.   ABNORMALITY - Continuous slow, generalized and lateralized right temporal region IMPRESSION: This study is suggestive of cortical dysfunction arising from right temporal region likely secondary to underlying structural abnormality/ stroke. There is also mild diffuse encephalopathy, nonspecific etiology. No seizures or epileptiform discharges were seen throughout the recording. Jorge [name]   ECHOCARDIOGRAM COMPLETE  Result Date: 05/10/2020    ECHOCARDIOGRAM REPORT   Patient Name:   Jorge [name] Date of Exam: 05/10/2020 Medical Rec #:  [id number]      Height:       70.0 in Accession #:    [id number]     Weight:       149.9 lb Date of Birth:  02-28-1970      BSA:          1.847 m Patient Age:    62 years       BP:           116/74 mmHg Patient Gender: M              HR:           79 bpm. Exam Location:  Inpatient Procedure: 2D Echo, Cardiac Doppler and Color Doppler Indications:    TIA  History:        Patient has prior history of Echocardiogram examinations, most                 recent 10/04/2019. Risk Factors:Hypertension and Diabetes.  Sonographer:    Cammy Brochure Referring Phys: [id number] TODD D MCDIARMID  Sonographer Comments: Patient refused further imaging IMPRESSIONS  1. Left ventricular ejection fraction, by estimation, is 60 to 65%. The left ventricle has normal function. The left ventricle has no regional wall motion abnormalities. There is severe left ventricular hypertrophy. Left ventricular diastolic parameters  are consistent with Grade I diastolic dysfunction (impaired relaxation).  2. Right ventricular systolic function is  normal. The right ventricular size is normal.  3. Left atrial size was mildly dilated.  4. The mitral valve is normal in structure. Mild mitral valve regurgitation. No evidence of mitral stenosis.  5. The aortic valve is normal in structure. Aortic valve regurgitation is not visualized. No aortic stenosis is present.  6. The inferior vena cava is normal in size with greater than 50% respiratory variability, suggesting right atrial pressure of 3 mmHg. FINDINGS  Left Ventricle: Left ventricular ejection fraction, by estimation, is 60 to 65%. The left ventricle has normal function. The left ventricle has no regional wall motion abnormalities. The left ventricular internal cavity size was normal in size. There is  severe left ventricular hypertrophy. Left ventricular diastolic parameters are consistent with Grade I diastolic dysfunction (impaired relaxation). Right Ventricle: The right ventricular size is normal. No increase in right ventricular wall thickness. Right ventricular systolic function is normal. Left Atrium: Left atrial size was mildly dilated. Right Atrium: Right atrial size was normal in size. Pericardium: There is no evidence of pericardial effusion. Mitral Valve: The mitral valve is normal in structure. Mild mitral valve regurgitation. No evidence of mitral valve stenosis. Tricuspid Valve: The tricuspid valve is normal in structure. Tricuspid valve regurgitation is not demonstrated. No evidence of tricuspid stenosis.  Aortic Valve: The aortic valve is normal in structure. Aortic valve regurgitation is not visualized. No aortic stenosis is present. Aortic valve mean gradient measures 3.0 mmHg. Aortic valve peak gradient measures 4.3 mmHg. Aortic valve area, by VTI measures 2.94 cm. Pulmonic Valve: The pulmonic valve was normal in structure. Pulmonic valve regurgitation is not visualized. No evidence of pulmonic stenosis. Aorta: The aortic root is normal in size and structure. Venous: The inferior vena  cava is normal in size with greater than 50% respiratory variability, suggesting right atrial pressure of 3 mmHg. IAS/Shunts: No atrial level shunt detected by color flow Doppler.  LEFT VENTRICLE PLAX 2D LVIDd:         4.10 cm  Diastology LVIDs:         2.70 cm  LV e' lateral:   9.14 cm/s LV PW:         1.70 cm  LV E/e' lateral: 7.1 LV IVS:        1.60 cm LVOT diam:     2.10 cm LV SV:         61 LV SV Index:   33 LVOT Area:     3.46 cm  LEFT ATRIUM           Index       RIGHT ATRIUM           Index LA diam:      3.50 cm 1.90 cm/m  RA Area:     13.80 cm LA Vol (A4C): 67.9 ml 36.77 ml/m RA Volume:   30.40 ml  16.46 ml/m  AORTIC VALVE AV Area (Vmax):    3.50 cm AV Area (Vmean):   3.20 cm AV Area (VTI):     2.94 cm AV Vmax:           104.00 cm/s AV Vmean:          76.000 cm/s AV VTI:            0.206 m AV Peak Grad:      4.3 mmHg AV Mean Grad:      3.0 mmHg LVOT Vmax:         105.00 cm/s LVOT Vmean:        70.300 cm/s LVOT VTI:          0.175 m LVOT/AV VTI ratio: 0.85  AORTA Ao Root diam: 3.50 cm MITRAL VALVE MV Area (PHT): 6.17 cm    SHUNTS MV Decel Time: 123 msec    Systemic VTI:  0.18 m MV E velocity: 64.90 cm/s  Systemic Diam: 2.10 cm MV A velocity: 58.80 cm/s MV E/A ratio:  1.10 Candee Furbish MD Electronically signed by Candee Furbish MD Signature Date/Time: 05/10/2020/3:23:00 PM    Final    VAS US CAROTID  Result Date: 05/10/2020 Carotid Arterial Duplex Study Patient Name:  RAMEY BYUN  Date of Exam:   05/10/2020 Medical Rec #: [id number]       Accession #:    [id number] Date of Birth: 12-26-1970       Patient Gender: M Patient Age:   49Y Exam Location:  [facility] Procedure:      VAS US CAROTID Referring Phys: [id number] TODD D MCDIARMID --------------------------------------------------------------------------------  Risk Factors:      Hypertension, Diabetes. Comparison Study:  no prior Performing Technologist: Abram Sander RVS  Examination Guidelines: A complete evaluation includes B-mode imaging,  spectral Doppler, color Doppler, and power Doppler as needed of all accessible portions of each vessel. Bilateral testing  is considered an integral part of a complete examination. Limited examinations for reoccurring indications may be performed as noted.  Right Carotid Findings: +----------+--------+--------+--------+------------------+--------+           PSV cm/sEDV cm/sStenosisPlaque DescriptionComments +----------+--------+--------+--------+------------------+--------+ CCA Prox  80      6               heterogenous               +----------+--------+--------+--------+------------------+--------+ CCA Distal67                      heterogenous               +----------+--------+--------+--------+------------------+--------+ ICA Prox  71      16      1-39%   heterogenous               +----------+--------+--------+--------+------------------+--------+ ICA Distal74      19                                         +----------+--------+--------+--------+------------------+--------+ ECA       71                                                 +----------+--------+--------+--------+------------------+--------+ +----------+--------+-------+--------+-------------------+           PSV cm/sEDV cmsDescribeArm Pressure (mmHG) +----------+--------+-------+--------+-------------------+ [id number]                                        +----------+--------+-------+--------+-------------------+ +---------+--------+--+--------+---------+ VertebralPSV cm/s53EDV cm/sAntegrade +---------+--------+--+--------+---------+  Left Carotid Findings: +----------+--------+--------+--------+------------------+--------+           PSV cm/sEDV cm/sStenosisPlaque DescriptionComments +----------+--------+--------+--------+------------------+--------+ CCA Prox  79      11              heterogenous                +----------+--------+--------+--------+------------------+--------+ CCA Distal189     22              heterogenous               +----------+--------+--------+--------+------------------+--------+ ICA Prox  63      17      1-39%   heterogenous               +----------+--------+--------+--------+------------------+--------+ ICA Distal70      18                                         +----------+--------+--------+--------+------------------+--------+ ECA       70      12                                         +----------+--------+--------+--------+------------------+--------+ +----------+--------+--------+--------+-------------------+           PSV cm/sEDV cm/sDescribeArm Pressure (mmHG) +----------+--------+--------+--------+-------------------+ [id number]                                         +----------+--------+--------+--------+-------------------+ +---------+--------+--+--------+---------+  VertebralPSV cm/s58EDV cm/sAntegrade +---------+--------+--+--------+---------+   Summary: Right Carotid: Velocities in the right ICA are consistent with a 1-39% stenosis. Left Carotid: Velocities in the left ICA are consistent with a 1-39% stenosis. Vertebrals: Bilateral vertebral arteries demonstrate antegrade flow. *See table(s) above for measurements and observations.     Preliminary     Significant Labs and Imaging:  Recent Labs  Lab 05/09/20 1506 05/10/20 0639 05/11/20 0347  WBC 5.5 5.5 5.7  HGB 10.1* 10.6* 10.5*  HCT 32.3* 32.8* 33.0*  PLT 89* 87* 87*   Recent Labs  Lab 05/09/20 1320 05/09/20 1414 05/10/20 0433 05/11/20 0347  NA 134* 135 137 135  K 4.6 4.6 4.9 4.2  CL 97* 99 99 96*  CO2 25  --  25 28  GLUCOSE 148* 147* 232* 269*  BUN 34* 36* 40* 27*  CREATININE 7.70* 8.60* 8.97* 6.46*  CALCIUM 8.4*  --  8.9 8.9  ALKPHOS 96  --   --   --   AST 14*  --   --   --   ALT 11  --   --   --   ALBUMIN 3.4*  --   --   --     Results/Tests  Pending at Time of Discharge: None  Discharge Medications:  Allergies as of 05/11/2020      Reactions   Lactose Intolerance (gi) Diarrhea, Nausea Only      Medication List    TAKE these medications   acetaminophen 325 MG tablet Commonly known as: TYLENOL Take 650 mg by mouth every 6 (six) hours as needed for mild pain.   amLODipine 10 MG tablet Commonly known as: NORVASC Take 10 mg by mouth daily.   aspirin EC 81 MG tablet Take 81 mg by mouth daily.   atorvastatin 80 MG tablet Commonly known as: LIPITOR Take 1 tablet (80 mg total) by mouth daily. What changed:   medication strength  See the new instructions.   bisacodyl 10 MG suppository Commonly known as: DULCOLAX Place 10 mg rectally daily as needed for moderate constipation.   carvedilol 12.5 MG tablet Commonly known as: COREG Take 12.5 mg by mouth 2 (two) times daily.   cloNIDine 0.1 MG tablet Commonly known as: CATAPRES Take 0.1 mg by mouth at bedtime.   gabapentin 300 MG capsule Commonly known as: NEURONTIN Take 300 mg by mouth at bedtime.   gentamicin cream 0.1 % Commonly known as: GARAMYCIN Apply 1 application topically daily.   insulin aspart 100 UNIT/ML injection Commonly known as: novoLOG Inject 5 Units into the skin 3 (three) times daily before meals.   insulin glargine 100 UNIT/ML injection Commonly known as: LANTUS Inject 10 Units into the skin daily.   loperamide 2 MG capsule Commonly known as: IMODIUM Take 2 mg by mouth daily as needed for diarrhea or loose stools.   Loperamide HCl 1 MG/7.5ML Liqd Take 17 mLs by mouth every 6 (six) hours as needed for diarrhea or loose stools.   metoCLOPramide 10 MG tablet Commonly known as: REGLAN Take 1 tablet (10 mg total) by mouth every 6 (six) hours as needed for nausea or vomiting. What changed: when to take this   prochlorperazine 5 MG tablet Commonly known as: COMPAZINE Take 5 mg by mouth every 6 (six) hours as needed for nausea or  vomiting.   sevelamer carbonate 800 MG tablet Commonly known as: RENVELA Take 2 tablets (1,600 mg total) by mouth 3 (three) times daily with meals. What changed: how much to take  Vitamin D (Ergocalciferol) 1.25 MG (50000 UNIT) Caps capsule Commonly known as: DRISDOL Take 50,000 Units by mouth once a week. saturdays       Discharge Instructions: Please refer to Patient Instructions section of EMR for full details.  Patient was counseled important signs and symptoms that should prompt return to medical care, changes in medications, dietary instructions, activity restrictions, and follow up appointments.   Follow-Up Appointments:  Contact information for follow-up providers    Frann Rider, NP. Schedule an appointment as soon as possible for a visit in 4 week(s).   Specialty: Neurology Contact information: [id number] [place] [address] [phone]            Contact information for after-discharge care    Destination    [facility] .   Service: Skilled Nursing Contact information: [address] [place] [phone]                  Honor Junes, MD 05/11/2020, 8:13 AM PGY-1, [place]

## 2020-05-11 NOTE — TOC Transition Note (Signed)
Transition of Care [facility]) - CM/SW Discharge Note   Patient Details  Name: Jorge [name] MRN: [id number] Date of Birth: [birth year]  [facility] CM/SW Contact:  Geralynn Ochs, LCSW Phone Number: 05/11/2020, 9:13 AM   Clinical Narrative:   Nurse to call report to [phone].    Final next level of care: Skilled Nursing Facility Barriers to Discharge: Barriers Resolved   Patient Goals and CMS Choice Patient states their goals for this hospitalization and ongoing recovery are:: get out of the hospital, back to [place] CMS Medicare.gov Compare Post Acute Care list provided to:: Patient Choice offered to / list presented to : Patient  Discharge Placement              Patient chooses bed at:  ([facility]) Patient to be transferred to facility by: [facility] Name of family member notified: self Patient and family notified of of transfer: 05/11/20  Discharge Plan and Services     Post Acute Care Choice: [place]                               Social Determinants of Health (SDOH) Interventions     Readmission Risk Interventions No flowsheet data found.

## 2020-05-13 DIAGNOSIS — N186 End stage renal disease: Secondary | ICD-10-CM | POA: Diagnosis not present

## 2020-05-13 DIAGNOSIS — Z89512 Acquired absence of left leg below knee: Secondary | ICD-10-CM | POA: Diagnosis not present

## 2020-05-13 DIAGNOSIS — E1122 Type 2 diabetes mellitus with diabetic chronic kidney disease: Secondary | ICD-10-CM | POA: Diagnosis not present

## 2020-05-13 DIAGNOSIS — G629 Polyneuropathy, unspecified: Secondary | ICD-10-CM | POA: Diagnosis not present

## 2020-05-13 DIAGNOSIS — I679 Cerebrovascular disease, unspecified: Secondary | ICD-10-CM | POA: Diagnosis not present

## 2020-05-13 DIAGNOSIS — Z89511 Acquired absence of right leg below knee: Secondary | ICD-10-CM | POA: Diagnosis not present

## 2020-05-13 DIAGNOSIS — I10 Essential (primary) hypertension: Secondary | ICD-10-CM | POA: Diagnosis not present

## 2020-05-13 DIAGNOSIS — Z992 Dependence on renal dialysis: Secondary | ICD-10-CM | POA: Diagnosis not present

## 2020-05-15 DIAGNOSIS — I679 Cerebrovascular disease, unspecified: Secondary | ICD-10-CM | POA: Diagnosis not present

## 2020-05-20 DIAGNOSIS — Z992 Dependence on renal dialysis: Secondary | ICD-10-CM | POA: Diagnosis not present

## 2020-05-20 DIAGNOSIS — E1122 Type 2 diabetes mellitus with diabetic chronic kidney disease: Secondary | ICD-10-CM | POA: Diagnosis not present

## 2020-05-20 DIAGNOSIS — L24A9 Irritant contact dermatitis due friction or contact with other specified body fluids: Secondary | ICD-10-CM | POA: Diagnosis not present

## 2020-05-20 DIAGNOSIS — I10 Essential (primary) hypertension: Secondary | ICD-10-CM | POA: Diagnosis not present

## 2020-05-20 DIAGNOSIS — L729 Follicular cyst of the skin and subcutaneous tissue, unspecified: Secondary | ICD-10-CM | POA: Diagnosis not present

## 2020-05-20 DIAGNOSIS — I679 Cerebrovascular disease, unspecified: Secondary | ICD-10-CM | POA: Diagnosis not present

## 2020-05-20 DIAGNOSIS — G629 Polyneuropathy, unspecified: Secondary | ICD-10-CM | POA: Diagnosis not present

## 2020-05-26 ENCOUNTER — Encounter (INDEPENDENT_AMBULATORY_CARE_PROVIDER_SITE_OTHER): Payer: Medicare Other | Admitting: Ophthalmology

## 2020-05-26 ENCOUNTER — Other Ambulatory Visit: Payer: Self-pay

## 2020-05-26 DIAGNOSIS — H35033 Hypertensive retinopathy, bilateral: Secondary | ICD-10-CM | POA: Diagnosis not present

## 2020-05-26 DIAGNOSIS — H43813 Vitreous degeneration, bilateral: Secondary | ICD-10-CM | POA: Diagnosis not present

## 2020-05-26 DIAGNOSIS — I10 Essential (primary) hypertension: Secondary | ICD-10-CM

## 2020-05-26 DIAGNOSIS — E113513 Type 2 diabetes mellitus with proliferative diabetic retinopathy with macular edema, bilateral: Secondary | ICD-10-CM

## 2020-06-07 DIAGNOSIS — E1122 Type 2 diabetes mellitus with diabetic chronic kidney disease: Secondary | ICD-10-CM | POA: Diagnosis not present

## 2020-06-07 DIAGNOSIS — Z992 Dependence on renal dialysis: Secondary | ICD-10-CM | POA: Diagnosis not present

## 2020-06-07 DIAGNOSIS — N186 End stage renal disease: Secondary | ICD-10-CM | POA: Diagnosis not present

## 2020-06-16 ENCOUNTER — Encounter (INDEPENDENT_AMBULATORY_CARE_PROVIDER_SITE_OTHER): Payer: Medicare Other | Admitting: Ophthalmology

## 2020-06-16 ENCOUNTER — Other Ambulatory Visit: Payer: Self-pay

## 2020-06-16 DIAGNOSIS — E113512 Type 2 diabetes mellitus with proliferative diabetic retinopathy with macular edema, left eye: Secondary | ICD-10-CM | POA: Diagnosis not present

## 2020-06-20 ENCOUNTER — Ambulatory Visit: Payer: Medicare Other | Admitting: Nurse Practitioner

## 2020-06-20 DIAGNOSIS — N186 End stage renal disease: Secondary | ICD-10-CM | POA: Diagnosis not present

## 2020-06-20 DIAGNOSIS — E1122 Type 2 diabetes mellitus with diabetic chronic kidney disease: Secondary | ICD-10-CM | POA: Diagnosis not present

## 2020-06-20 DIAGNOSIS — G629 Polyneuropathy, unspecified: Secondary | ICD-10-CM | POA: Diagnosis not present

## 2020-06-20 DIAGNOSIS — I679 Cerebrovascular disease, unspecified: Secondary | ICD-10-CM | POA: Diagnosis not present

## 2020-06-21 ENCOUNTER — Encounter (HOSPITAL_BASED_OUTPATIENT_CLINIC_OR_DEPARTMENT_OTHER): Payer: Medicare Other | Attending: Internal Medicine | Admitting: Internal Medicine

## 2020-06-21 ENCOUNTER — Encounter: Payer: Self-pay | Admitting: Nurse Practitioner

## 2020-06-21 ENCOUNTER — Ambulatory Visit (INDEPENDENT_AMBULATORY_CARE_PROVIDER_SITE_OTHER): Payer: Medicare Other | Admitting: Nurse Practitioner

## 2020-06-21 ENCOUNTER — Other Ambulatory Visit: Payer: Self-pay

## 2020-06-21 DIAGNOSIS — Z0000 Encounter for general adult medical examination without abnormal findings: Secondary | ICD-10-CM | POA: Diagnosis not present

## 2020-06-21 DIAGNOSIS — Z794 Long term (current) use of insulin: Secondary | ICD-10-CM | POA: Insufficient documentation

## 2020-06-21 DIAGNOSIS — L02411 Cutaneous abscess of right axilla: Secondary | ICD-10-CM | POA: Diagnosis not present

## 2020-06-21 DIAGNOSIS — Z8673 Personal history of transient ischemic attack (TIA), and cerebral infarction without residual deficits: Secondary | ICD-10-CM | POA: Diagnosis not present

## 2020-06-21 DIAGNOSIS — N186 End stage renal disease: Secondary | ICD-10-CM | POA: Insufficient documentation

## 2020-06-21 DIAGNOSIS — Z87891 Personal history of nicotine dependence: Secondary | ICD-10-CM | POA: Diagnosis not present

## 2020-06-21 DIAGNOSIS — I120 Hypertensive chronic kidney disease with stage 5 chronic kidney disease or end stage renal disease: Secondary | ICD-10-CM | POA: Insufficient documentation

## 2020-06-21 DIAGNOSIS — Z89512 Acquired absence of left leg below knee: Secondary | ICD-10-CM | POA: Diagnosis not present

## 2020-06-21 DIAGNOSIS — Z992 Dependence on renal dialysis: Secondary | ICD-10-CM | POA: Insufficient documentation

## 2020-06-21 DIAGNOSIS — E1122 Type 2 diabetes mellitus with diabetic chronic kidney disease: Secondary | ICD-10-CM | POA: Diagnosis not present

## 2020-06-21 DIAGNOSIS — Z89511 Acquired absence of right leg below knee: Secondary | ICD-10-CM | POA: Diagnosis not present

## 2020-06-21 NOTE — Progress Notes (Signed)
Jorge [name], Jorge [name] ([id number]) Visit Report for 06/21/2020 Chief Complaint Document Details Patient Name: Date of Service: Jorge [name], Jorge RRIN D. 06/21/2020 1:15 PM Medical Record Number: [id number] Patient Account Number: [id number] Date of Birth/Sex: Treating RN: 03-24-1970 (50 y.o. Male) Rhae Hammock Primary Care Provider: Sherrie Mustache Other Clinician: Referring Provider: Treating Provider/Extender: Deloria Lair in Treatment: 0 Information Obtained from: Patient Chief Complaint 06/21/2020; patient is here for review of an area in the right axilla which apparently may have been an abscess 2 months ago. Electronic Signature(s) Signed: 06/21/2020 4:29:11 PM By: Linton Ham MD Entered By: Linton Ham on 06/21/2020 14:33:07 -------------------------------------------------------------------------------- HPI Details Patient Name: Date of Service: Jorge [name], Jorge RRIN D. 06/21/2020 1:15 PM Medical Record Number: [id number] Patient Account Number: [id number] Date of Birth/Sex: Treating RN: 1970-04-22 (50 y.o. Male) Rhae Hammock Primary Care Provider: Sherrie Mustache Other Clinician: Referring Provider: Treating Provider/Extender: Deloria Lair in Treatment: 0 History of Present Illness HPI Description: ADMISSION 06/21/2020 This is a medically complex 50 year old man who is a type II diabetic. He tells me 2 months ago he developed a painful swelling in his right axilla. This apparently drained somewhat. He may have been given antibiotics in the skilled facility where he lives but I am just not completely sure. It was painful initially but has not been painful now and he has no open area. He is here for our review of this. He tells [place] that he has had a history of abscesses in the past but not recent past. He has an operating shunt in his right arm for dialysis. 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 Past medical history is  extensive he is a type II diabetic, chronic renal failure on dialysis, hypertension, hyperlipidemia. Recent admission to hospital with 05/09/2020 through 05/11/2020 with a TIA. I do not see any specific reference to recurrent abscesses, staph infections other than it listed on his problem list that he had a localized infection of the skin in January 2021 Electronic Signature(s) Signed: 06/21/2020 4:29:11 PM By: Linton Ham MD Entered By: Linton Ham on 06/21/2020 14:35:42 -------------------------------------------------------------------------------- Physical Exam Details Patient Name: Date of Service: Jorge [name], Jorge RRIN D. 06/21/2020 1:15 PM Medical Record Number: [id number] Patient Account Number: [id number] Date of Birth/Sex: Treating RN: 1970/07/16 (50 y.o. Male) Rhae Hammock Primary Care Provider: Sherrie Mustache Other Clinician: Referring Provider: Treating Provider/Extender: Deloria Lair in Treatment: 0 Constitutional Patient is hypertensive.. Pulse regular and within target range for patient.Marland Kitchen Respirations regular, non-labored and within target range.. Temperature is normal and within the target range for the patient.Marland Kitchen Appears in no distress. Notes Wound exam; there is no open wound in the right axilla there is a small cutaneous cyst that is nontender. I could not really detect any obvious open area. The skin in his axilla looks normal nothing to suggest he has hidradenitis. No lymphadenopathy is palpable. He has a nice functioning shunt in the right upper arm no evidence of infection here either Electronic Signature(s) Signed: 06/21/2020 4:29:11 PM By: Linton Ham MD Entered By: Linton Ham on 06/21/2020 14:39:40 -------------------------------------------------------------------------------- Physician Orders Details Patient Name: Date of Service: Jorge [name], Jorge RRIN D. 06/21/2020 1:15 PM Medical Record Number: [id number] Patient Account  Number: [id number] Date of Birth/Sex: Treating RN: 22-Feb-1970 (50 y.o. Male) Baruch Gouty Primary Care Provider: Sherrie Mustache Other Clinician: Referring Provider: Treating Provider/Extender: Deloria Lair in Treatment: 0 Verbal / Phone Orders: No Diagnosis Coding  Discharge From [facility] Services Discharge from [place] Bathing/ Shower/ Hygiene May shower and wash wound with soap and water. Electronic Signature(s) Signed: 06/21/2020 4:29:11 PM By: Linton Ham MD Signed: 06/21/2020 6:10:01 PM By: Baruch Gouty RN, BSN Entered By: Baruch Gouty on 06/21/2020 14:18:57 -------------------------------------------------------------------------------- Problem List Details Patient Name: Date of Service: Jorge [name], Jorge RRIN D. 06/21/2020 1:15 PM Medical Record Number: [id number] Patient Account Number: [id number] Date of Birth/Sex: Treating RN: 1970-03-12 (50 y.o. Male) Rhae Hammock Primary Care Provider: Sherrie Mustache Other Clinician: Referring Provider: Treating Provider/Extender: Deloria Lair in Treatment: 0 Active Problems ICD-10 Encounter Code Description Active Date MDM Diagnosis L02.411 Cutaneous abscess of right axilla 06/21/2020 No Yes Inactive Problems Resolved Problems Electronic Signature(s) Signed: 06/21/2020 4:29:11 PM By: Linton Ham MD Entered By: Linton Ham on 06/21/2020 14:31:54 -------------------------------------------------------------------------------- Progress Note Details Patient Name: Date of Service: Jorge [name], Jorge RRIN D. 06/21/2020 1:15 PM Medical Record Number: [id number] Patient Account Number: [id number] Date of Birth/Sex: Treating RN: 01-15-1970 (50 y.o. Male) Rhae Hammock Primary Care Provider: Sherrie Mustache Other Clinician: Referring Provider: Treating Provider/Extender: Deloria Lair in Treatment: 0 Subjective Chief  Complaint Information obtained from Patient 06/21/2020; patient is here for review of an area in the right axilla which apparently may have been an abscess 2 months ago. History of Present Illness (HPI) ADMISSION 06/21/2020 This is a medically complex 50 year old man who is a type II diabetic. He tells me 2 months ago he developed a painful swelling in his right axilla. This apparently drained somewhat. He may have been given antibiotics in the skilled facility where he lives but I am just not completely sure. It was painful initially but has not been painful now and he has no open area. He is here for our review of this. He tells [place] that he has had a history of abscesses in the past but not recent past. He has an operating shunt in his right arm for dialysis. 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 Past medical history is extensive he is a type II diabetic, chronic renal failure on dialysis, hypertension, hyperlipidemia. Recent admission to hospital with 05/09/2020 through 05/11/2020 with a TIA. I do not see any specific reference to recurrent abscesses, staph infections other than it listed on his problem list that he had a localized infection of the skin in January 2021 Patient History Information obtained from Patient. Allergies lactose (Reaction: intolerance) Family History Cancer - Father, Diabetes - Mother,Siblings, Heart Disease - Mother, Hypertension - Siblings,Mother, Kidney Disease - Mother, Stroke - Siblings, No family history of Hereditary Spherocytosis, Lung Disease, Seizures, Thyroid Problems, Tuberculosis. Social History Former smoker - quit 5 yrs ago, Marital Status - Divorced, Alcohol Use - Never, Drug Use - No History, Caffeine Use - Rarely - coffee. Medical History Eyes Patient has history of Cataracts - needs surgery left eye Denies history of Glaucoma, Optic Neuritis Hematologic/Lymphatic Patient has history of Anemia Denies history of Hemophilia, Human  Immunodeficiency Virus, Lymphedema, Sickle Cell Disease Cardiovascular Patient has history of Hypertension, Peripheral Arterial Disease Endocrine Patient has history of Type II Diabetes Denies history of Type I Diabetes Genitourinary Patient has history of End Stage Renal Disease - HD Integumentary (Skin) Denies history of History of Burn Oncologic Denies history of Received Chemotherapy, Received Radiation Psychiatric Patient has history of Confinement Anxiety Denies history of Anorexia/bulimia Patient is treated with Insulin. Blood sugar is tested. Hospitalization/Surgery History - right AV fistula. - bilateral BKA. -  basilic vein transposition. - hernia reapir. - knee surgery. Medical A Surgical History Notes nd Gastrointestinal gastroparesis Genitourinary failed peritoneal dialysis Neurologic ICH Review of Systems (ROS) Constitutional Symptoms (General Health) Denies complaints or symptoms of Fatigue, Fever, Chills, Marked Weight Change. Eyes Complains or has symptoms of Glasses / Contacts. Denies complaints or symptoms of Dry Eyes, Vision Changes. Ear/Nose/Mouth/Throat Denies complaints or symptoms of Chronic sinus problems or rhinitis. Respiratory Denies complaints or symptoms of Chronic or frequent coughs, Shortness of Breath. Cardiovascular Denies complaints or symptoms of Chest pain. Gastrointestinal Complains or has symptoms of Frequent diarrhea. Denies complaints or symptoms of Nausea, Vomiting. Endocrine Denies complaints or symptoms of Heat/cold intolerance. Genitourinary Denies complaints or symptoms of Frequent urination. Integumentary (Skin) Complains or has symptoms of Wounds - right axilla. Musculoskeletal Denies complaints or symptoms of Muscle Pain, Muscle Weakness. Psychiatric Complains or has symptoms of Claustrophobia. Denies complaints or symptoms of Suicidal. Objective Constitutional Patient is hypertensive.. Pulse regular and within  target range for patient.Marland Kitchen Respirations regular, non-labored and within target range.. Temperature is normal and within the target range for the patient.Marland Kitchen Appears in no distress. Vitals Time Taken: 1:42 PM, Height: 71 in, Source: Stated, Weight: 144 lbs, Source: Stated, BMI: 20.1, Temperature: 98.4 F, Pulse: 65 bpm, Respiratory Rate: 18 breaths/min, Blood Pressure: 174/79 mmHg, Capillary Blood Glucose: 126 mg/dl. General Notes: glucose per pt report this am General Notes: Wound exam; there is no open wound in the right axilla there is a small cutaneous cyst that is nontender. I could not really detect any obvious open area. The skin in his axilla looks normal nothing to suggest he has hidradenitis. No lymphadenopathy is palpable. Assessment Active Problems ICD-10 Cutaneous abscess of right axilla Plan Discharge From [facility] Services: Discharge from [place] Bathing/ Shower/ Hygiene: May shower and wash wound with soap and water. 1. Patient came in for review of what sounds like an abscess in the right axilla that came up about 2 months ago according the patient. 2. He may have had antibiotics but I do not think he had a surgical IandD. He does have a remote history of this type of thing but not recently and not in this area 3. Physical exam suggests he still has a small cystlike clearly cutaneous area in the axilla but it is nontender nonerythematous. It is much too small at this point to consider opening. I would only consider doing this if this becomes a major recurrence 4. There is no evidence of a systemic skin problem like hidradenitis I looked at the skin in both his axilla nothing suggest this including no sinus tracts scarring etc. Electronic Signature(s) Signed: 06/21/2020 4:29:11 PM By: Linton Ham MD Entered By: Linton Ham on 06/21/2020 14:38:46 -------------------------------------------------------------------------------- HxROS Details Patient Name: Date of  Service: Jorge [name], Jorge RRIN D. 06/21/2020 1:15 PM Medical Record Number: Jorge [name] Patient Account Number: [id number] Date of Birth/Sex: Treating RN: Nov 01, 1970 (50 y.o. Male) Baruch Gouty Primary Care Provider: Sherrie Mustache Other Clinician: Referring Provider: Treating Provider/Extender: Deloria Lair in Treatment: 0 Information Obtained From Patient Constitutional Symptoms (General Health) Complaints and Symptoms: Negative for: Fatigue; Fever; Chills; Marked Weight Change Eyes Complaints and Symptoms: Positive for: Glasses / Contacts Negative for: Dry Eyes; Vision Changes Medical History: Positive for: Cataracts - needs surgery left eye Negative for: Glaucoma; Optic Neuritis Ear/Nose/Mouth/Throat Complaints and Symptoms: Negative for: Chronic sinus problems or rhinitis Respiratory Complaints and Symptoms: Negative for: Chronic or frequent coughs; Shortness of Breath Cardiovascular Complaints and  Symptoms: Negative for: Chest pain Medical History: Positive for: Hypertension; Peripheral Arterial Disease Gastrointestinal Complaints and Symptoms: Positive for: Frequent diarrhea Negative for: Nausea; Vomiting Medical History: Past Medical History Notes: gastroparesis Endocrine Complaints and Symptoms: Negative for: Heat/cold intolerance Medical History: Positive for: Type II Diabetes Negative for: Type I Diabetes Time with diabetes: 83 yrs Treated with: Insulin Blood sugar tested every day: Yes Tested : 3x per day Genitourinary Complaints and Symptoms: Negative for: Frequent urination Medical History: Positive for: End Stage Renal Disease - HD Past Medical History Notes: failed peritoneal dialysis Integumentary (Skin) Complaints and Symptoms: Positive for: Wounds - right axilla Medical History: Negative for: History of Burn Musculoskeletal Complaints and Symptoms: Negative for: Muscle Pain; Muscle  Weakness Psychiatric Complaints and Symptoms: Positive for: Claustrophobia Negative for: Suicidal Medical History: Positive for: Confinement Anxiety Negative for: Anorexia/bulimia Hematologic/Lymphatic Medical History: Positive for: Anemia Negative for: Hemophilia; Human Immunodeficiency Virus; Lymphedema; Sickle Cell Disease Immunological Neurologic Medical History: Past Medical History Notes: ICH Oncologic Medical History: Negative for: Received Chemotherapy; Received Radiation HBO Extended History Items Eyes: Cataracts Immunizations Pneumococcal Vaccine: Received Pneumococcal Vaccination: Yes Implantable Devices Yes Hospitalization / Surgery History Type of Hospitalization/Surgery right AV fistula bilateral BKA basilic vein transposition hernia reapir knee surgery Family and Social History Cancer: Yes - Father; Diabetes: Yes - Mother,Siblings; Heart Disease: Yes - Mother; Hereditary Spherocytosis: No; Hypertension: Yes - Siblings,Mother; Kidney Disease: Yes - Mother; Lung Disease: No; Seizures: No; Stroke: Yes - Siblings; Thyroid Problems: No; Tuberculosis: No; Former smoker - quit 5 yrs ago; Marital Status - Divorced; Alcohol Use: Never; Drug Use: No History; Caffeine Use: Rarely - coffee; Financial Concerns: No; Food, Clothing or Shelter Needs: No; Support System Lacking: No; Transportation Concerns: No Electronic Signature(s) Signed: 06/21/2020 4:29:11 PM By: Linton Ham MD Signed: 06/21/2020 6:10:01 PM By: Baruch Gouty RN, BSN Entered By: Baruch Gouty on 06/21/2020 13:57:33 -------------------------------------------------------------------------------- Jorge [name] Details Patient Name: Date of Service: [place], Jorge RRIN D. 06/21/2020 Medical Record Number: Jorge [name] Patient Account Number: [id number] Date of Birth/Sex: Treating RN: 1970/08/16 (50 y.o. Male) Baruch Gouty Primary Care Provider: Sherrie Mustache Other Clinician: Referring  Provider: Treating Provider/Extender: Deloria Lair in Treatment: 0 Diagnosis Coding ICD-10 Codes Code Description L02.411 Cutaneous abscess of right axilla Facility Procedures CPT4 Code: [id number] Description: [id number] - WOUND CARE VISIT-LEV 3 EST PT Modifier: Quantity: 1 Physician Procedures : CPT4 Code Description Modifier [id number] - WC PHYS LEVEL 2 - NEW PT ICD-10 Diagnosis Description L02.411 Cutaneous abscess of right axilla Quantity: 1 Electronic Signature(s) Signed: 06/21/2020 4:29:11 PM By: Linton Ham MD Entered By: Linton Ham on 06/21/2020 14:39:12

## 2020-06-21 NOTE — Patient Instructions (Signed)
Jorge [name] , Thank you for taking time to come for your Medicare Wellness Visit. I appreciate your ongoing commitment to your health goals. Please review the following plan we discussed and let me know if I can assist you in the future.   Screening recommendations/referrals: Colonoscopy up to date Recommended yearly ophthalmology/optometry visit for glaucoma screening and checkup Recommended yearly dental visit for hygiene and checkup  Vaccinations: Influenza vaccine up to date Pneumococcal vaccine up to date Tdap vaccine -RECOMMENDED to get every 10 years- please let us know when your last Tdap was Shingles vaccine RECOMMENDED- to get at local pharmacy    Advanced directives: recommended to complete and bring back to office so we can place on file.   Conditions/risks identified: complications related to diabetes, nutritional risk factors.   Next appointment: 1 year for AWV  Preventive Care 40-64 Years, Male Preventive care refers to lifestyle choices and visits with your health care provider that can promote health and wellness. What does preventive care include? A yearly physical exam. This is also called an annual well check. Dental exams once or twice a year. Routine eye exams. Ask your health care provider how often you should have your eyes checked. Personal lifestyle choices, including: Daily care of your teeth and gums. Regular physical activity. Eating a healthy diet. Avoiding tobacco and drug use. Limiting alcohol use. Practicing safe sex. Taking low-dose aspirin every day starting at age 63. What happens during an annual well check? The services and screenings done by your health care provider during your annual well check will depend on your age, overall health, lifestyle risk factors, and family history of disease. Counseling  Your health care provider may ask you questions about your: Alcohol use. Tobacco use. Drug use. Emotional well-being. Home and  relationship well-being. Sexual activity. Eating habits. Work and work Statistician. Screening  You may have the following tests or measurements: Height, weight, and BMI. Blood pressure. Lipid and cholesterol levels. These may be checked every 5 years, or more frequently if you are over 36 years old. Skin check. Lung cancer screening. You may have this screening every year starting at age 50 if you have a 30-pack-year history of smoking and currently smoke or have quit within the past 15 years. Fecal occult blood test (FOBT) of the stool. You may have this test every year starting at age 35. Flexible sigmoidoscopy or colonoscopy. You may have a sigmoidoscopy every 5 years or a colonoscopy every 10 years starting at age 12. Prostate cancer screening. Recommendations will vary depending on your family history and other risks. Hepatitis C blood test. Hepatitis B blood test. Sexually transmitted disease (STD) testing. Diabetes screening. This is done by checking your blood sugar (glucose) after you have not eaten for a while (fasting). You may have this done every 1-3 years. Discuss your test results, treatment options, and if necessary, the need for more tests with your health care provider. Vaccines  Your health care provider may recommend certain vaccines, such as: Influenza vaccine. This is recommended every year. Tetanus, diphtheria, and acellular pertussis (Tdap, Td) vaccine. You may need a Td booster every 10 years. Zoster vaccine. You may need this after age 19. Pneumococcal 13-valent conjugate (PCV13) vaccine. You may need this if you have certain conditions and have not been vaccinated. Pneumococcal polysaccharide (PPSV23) vaccine. You may need one or two doses if you smoke cigarettes or if you have certain conditions. Talk to your health care provider about which screenings and vaccines you  need and how often you need them. This information is not intended to replace advice given to  you by your health care provider. Make sure you discuss any questions you have with your health care provider. Document Released: 01/21/2015 Document Revised: 09/14/2015 Document Reviewed: 10/26/2014 Elsevier Interactive Patient Education  2017 [place] Prevention in the Home Falls can cause injuries. They can happen to people of all ages. There are many things you can do to make your home safe and to help prevent falls. What can I do on the outside of my home? Regularly fix the edges of walkways and driveways and fix any cracks. Remove anything that might make you trip as you walk through a door, such as a raised step or threshold. Trim any bushes or trees on the path to your home. Use bright outdoor lighting. Clear any walking paths of anything that might make someone trip, such as rocks or tools. Regularly check to see if handrails are loose or broken. Make sure that both sides of any steps have handrails. Any raised decks and porches should have guardrails on the edges. Have any leaves, snow, or ice cleared regularly. Use sand or salt on walking paths during winter. Clean up any spills in your garage right away. This includes oil or grease spills. What can I do in the bathroom? Use night lights. Install grab bars by the toilet and in the tub and shower. Do not use towel bars as grab bars. Use non-skid mats or decals in the tub or shower. If you need to sit down in the shower, use a plastic, non-slip stool. Keep the floor dry. Clean up any water that spills on the floor as soon as it happens. Remove soap buildup in the tub or shower regularly. Attach bath mats securely with double-sided non-slip rug tape. Do not have throw rugs and other things on the floor that can make you trip. What can I do in the bedroom? Use night lights. Make sure that you have a light by your bed that is easy to reach. Do not use any sheets or blankets that are too big for your bed. They should not  hang down onto the floor. Have a firm chair that has side arms. You can use this for support while you get dressed. Do not have throw rugs and other things on the floor that can make you trip. What can I do in the kitchen? Clean up any spills right away. Avoid walking on wet floors. Keep items that you use a lot in easy-to-reach places. If you need to reach something above you, use a strong step stool that has a grab bar. Keep electrical cords out of the way. Do not use floor polish or wax that makes floors slippery. If you must use wax, use non-skid floor wax. Do not have throw rugs and other things on the floor that can make you trip. What can I do with my stairs? Do not leave any items on the stairs. Make sure that there are handrails on both sides of the stairs and use them. Fix handrails that are broken or loose. Make sure that handrails are as long as the stairways. Check any carpeting to make sure that it is firmly attached to the stairs. Fix any carpet that is loose or worn. Avoid having throw rugs at the top or bottom of the stairs. If you do have throw rugs, attach them to the floor with carpet tape. Make sure that  you have a light switch at the top of the stairs and the bottom of the stairs. If you do not have them, ask someone to add them for you. What else can I do to help prevent falls? Wear shoes that: Do not have high heels. Have rubber bottoms. Are comfortable and fit you well. Are closed at the toe. Do not wear sandals. If you use a stepladder: Make sure that it is fully opened. Do not climb a closed stepladder. Make sure that both sides of the stepladder are locked into place. Ask someone to hold it for you, if possible. Clearly mark and make sure that you can see: Any grab bars or handrails. First and last steps. Where the edge of each step is. Use tools that help you move around (mobility aids) if they are needed. These  include: Canes. Walkers. Scooters. Crutches. Turn on the lights when you go into a dark area. Replace any light bulbs as soon as they burn out. Set up your furniture so you have a clear path. Avoid moving your furniture around. If any of your floors are uneven, fix them. If there are any pets around you, be aware of where they are. Review your medicines with your doctor. Some medicines can make you feel dizzy. This can increase your chance of falling. Ask your doctor what other things that you can do to help prevent falls. This information is not intended to replace advice given to you by your health care provider. Make sure you discuss any questions you have with your health care provider. Document Released: 10/21/2008 Document Revised: 06/02/2015 Document Reviewed: 01/29/2014 Elsevier Interactive Patient Education  2017 [employer].

## 2020-06-21 NOTE — Progress Notes (Signed)
Jorge [name], Jorge [name] ([id number]) Visit Report for 06/21/2020 Abuse/Suicide Risk Screen Details Patient Name: Date of Service: Jorge [name], [name] 06/21/2020 1:15 PM Medical Record Number: [id number] Patient Account Number: [id number] Date of Birth/Sex: Treating RN: 1970/01/28 (50 y.o. Male) Baruch Gouty Primary Care Nassim Cosma: Sherrie Mustache Other Clinician: Referring Elizjah Noblet: Treating Dell Briner/Extender: Deloria Lair in Treatment: 0 Abuse/Suicide Risk Screen Items Answer ABUSE RISK SCREEN: Has anyone close to you tried to hurt or harm you recentlyo No Do you feel uncomfortable with anyone in your familyo No Has anyone forced you do things that you didnt want to doo No Electronic Signature(s) Signed: 06/21/2020 6:10:01 PM By: Baruch Gouty RN, BSN Entered By: Baruch Gouty on 06/21/2020 13:57:41 -------------------------------------------------------------------------------- Activities of Daily Living Details Patient Name: Date of Service: DA Jorge [name], [name] 06/21/2020 1:15 PM Medical Record Number: [id number] Patient Account Number: [id number] Date of Birth/Sex: Treating RN: 09-13-1970 (50 y.o. Male) Baruch Gouty Primary Care Syanna Remmert: Sherrie Mustache Other Clinician: Referring Lawren Sexson: Treating Brentlee Sciara/Extender: Deloria Lair in Treatment: 0 Activities of Daily Living Items Answer Activities of Daily Living (Please select one for each item) Drive Automobile Not Able T Medications ake Need Assistance Use T elephone Completely Able Care for Appearance Completely Able Use T oilet Completely Able Bath / Shower Completely Able Dress Self Completely Able Feed Self Completely Able Walk Need Assistance Get In / Out Bed Completely Able Housework Need Assistance Prepare Meals Need Assistance Handle Money Completely Able Shop for Self Need Assistance Electronic Signature(s) Signed: 06/21/2020 6:10:01 PM By: Baruch Gouty RN, BSN Entered By: Baruch Gouty on 06/21/2020 13:58:50 -------------------------------------------------------------------------------- Education Screening Details Patient Name: Date of Service: Jorge [name], [name] 06/21/2020 1:15 PM Medical Record Number: [id number] Patient Account Number: [id number] Date of Birth/Sex: Treating RN: 02/15/1970 (50 y.o. Male) Baruch Gouty Primary Care Kawanna Christley: Sherrie Mustache Other Clinician: Referring Visente Kirker: Treating Marilena Trevathan/Extender: Deloria Lair in Treatment: 0 Primary Learner Assessed: Patient Learning Preferences/Education Level/Primary Language Learning Preference: Explanation, Demonstration, Printed Material Highest Education Level: Jorge or Above Preferred Language: English Cognitive Barrier Language Barrier: No Translator Needed: No Memory Deficit: No Emotional Barrier: No Cultural/Religious Beliefs Affecting Medical Care: No Physical Barrier Impaired Vision: Yes Glasses Impaired Hearing: No Decreased Hand dexterity: No Knowledge/Comprehension Knowledge Level: High Comprehension Level: High Ability to understand written instructions: High Ability to understand verbal instructions: High Motivation Anxiety Level: Calm Cooperation: Cooperative Education Importance: Acknowledges Need Interest in Health Problems: Asks Questions Perception: Coherent Willingness to Engage in Self-Management High Activities: Readiness to Engage in Self-Management High Activities: Electronic Signature(s) Signed: 06/21/2020 6:10:01 PM By: Baruch Gouty RN, BSN Entered By: Baruch Gouty on 06/21/2020 13:59:25 -------------------------------------------------------------------------------- Fall Risk Assessment Details Patient Name: Date of Service: [name], [name] 06/21/2020 1:15 PM Medical Record Number: [id number] Patient Account Number: [id number] Date of Birth/Sex: Treating RN: 1970-02-07 (50  y.o. Male) Baruch Gouty Primary Care [name]: Sherrie Mustache Other Clinician: Referring Mazey Mantell: Treating [name]/Extender: Deloria Lair in Treatment: 0 Fall Risk Assessment Items Have you had 2 or more falls in the last 12 monthso 0 No Have you had any fall that resulted in injury in the last 12 monthso 0 No FALLS RISK SCREEN History of falling - immediate or within 3 months 0 No Secondary diagnosis (Do you have 2 or more medical diagnoseso) 0 No Ambulatory aid None/bed rest/wheelchair/nurse 0 No Crutches/cane/walker 15 Yes Furniture 0 No Intravenous therapy Access/Saline/Heparin Lock 0 No Gait/Transferring  Normal/ bed rest/ wheelchair 0 No Weak (short steps with or without shuffle, stooped but able to lift head while walking, may seek 10 Yes support from furniture) Impaired (short steps with shuffle, may have difficulty arising from chair, head down, impaired 0 No balance) Mental Status Oriented to own ability 0 Yes Electronic Signature(s) Signed: 06/21/2020 6:10:01 PM By: Baruch Gouty RN, BSN Entered By: Baruch Gouty on 06/21/2020 14:00:17 -------------------------------------------------------------------------------- Foot Assessment Details Patient Name: Date of Service: Jorge [name], [name] 06/21/2020 1:15 PM Medical Record Number: [id number] Patient Account Number: [id number] Date of Birth/Sex: Treating RN: [birth year] (50 y.o. Male) Baruch Gouty Primary Care [name]: Sherrie Mustache Other Clinician: Referring Aairah Negrette: Treating Barret Esquivel/Extender: Deloria Lair in Treatment: 0 Foot Assessment Items '[x]'$  Unable to perform right foot assessment due to amputation '[x]'$  Unable to perform left foot assessment due to amputation Site Locations + = Sensation present, - = Sensation absent, C = Callus, U = Ulcer R = Redness, W = Warmth, M = Maceration, PU = Pre-ulcerative lesion F = Fissure, S = Swelling, D =  Dryness Assessment Right: Left: Other Deformity: Prior Foot Ulcer: Prior Amputation: Charcot Joint: Ambulatory Status: Ambulatory With Help Assistance Device: Walker Gait: Steady Electronic Signature(s) Signed: 06/21/2020 6:10:01 PM By: Baruch Gouty RN, BSN Entered By: Baruch Gouty on 06/21/2020 14:01:06 -------------------------------------------------------------------------------- Nutrition Risk Screening Details Patient Name: Date of Service: [name], [name] 06/21/2020 1:15 PM Medical Record Number: [id number] Patient Account Number: [id number] Date of Birth/Sex: Treating RN: 09-25-1970 (50 y.o. Male) Baruch Gouty Primary Care Lisandra Mathisen: Sherrie Mustache Other Clinician: Referring Armenta Erskin: Treating Michaeljoseph Revolorio/Extender: Deloria Lair in Treatment: 0 Height (in): 71 Weight (lbs): 144 Body Mass Index (BMI): 20.1 Nutrition Risk Screening Items Score Screening NUTRITION RISK SCREEN: I have an illness or condition that made me change the kind and/or amount of food I eat 0 No I eat fewer than two meals per day 0 No I eat few fruits and vegetables, or milk products 0 No I have three or more drinks of beer, liquor or wine almost every day 0 No I have tooth or mouth problems that make it hard for me to eat 0 No I don't always have enough money to buy the food I need 0 No I eat alone most of the time 1 Yes I take three or more different prescribed or over-the-counter drugs a day 1 Yes Without wanting to, I have lost or gained 10 pounds in the last six months 0 No I am not always physically able to shop, cook and/or feed myself 0 No Nutrition Protocols Good Risk Protocol 0 No interventions needed Moderate Risk Protocol High Risk Proctocol Risk Level: Good Risk Score: 2 Electronic Signature(s) Signed: 06/21/2020 6:10:01 PM By: Baruch Gouty RN, BSN Entered By: Baruch Gouty on 06/21/2020 14:00:51
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
## 2020-06-21 NOTE — Progress Notes (Signed)
This service is provided via telemedicine  No vital signs collected/recorded due to the encounter was a telemedicine visit.   Location of patient (ex: home, work): [place] facility  Patient consents to a telephone visit:  Yes, see encounter dated 08/19/2019  Location of the provider (ex: office, home):  [place]  Name of any referring provider:  N/A  Names of all persons participating in the telemedicine service and their role in the encounter:  Sherrie Mustache, Nurse Practitioner, Carroll Kinds, CMA, and patient.   Time spent on call:  12 minutes with medical assistant

## 2020-06-22 DIAGNOSIS — Z992 Dependence on renal dialysis: Secondary | ICD-10-CM | POA: Diagnosis not present

## 2020-06-22 DIAGNOSIS — E162 Hypoglycemia, unspecified: Secondary | ICD-10-CM | POA: Diagnosis not present

## 2020-06-22 DIAGNOSIS — I10 Essential (primary) hypertension: Secondary | ICD-10-CM | POA: Diagnosis not present

## 2020-06-22 DIAGNOSIS — E1122 Type 2 diabetes mellitus with diabetic chronic kidney disease: Secondary | ICD-10-CM | POA: Diagnosis not present

## 2020-06-22 DIAGNOSIS — N186 End stage renal disease: Secondary | ICD-10-CM | POA: Diagnosis not present

## 2020-06-22 NOTE — Progress Notes (Signed)
Jorge [name], Jorge [name] ([id number]) Visit Report for 06/21/2020 Allergy List Details Patient Name: Date of Service: [place], [name] 06/21/2020 1:15 PM Medical Record Number: [id number] Patient Account Number: [id number] Date of Birth/Sex: Treating RN: [birth year] (50 y.o. Male) Baruch Gouty Primary Care [place]: Sherrie Mustache Other Clinician: Referring Jorge [name]: Treating Jorge [name]/Extender: Deloria Lair in Treatment: 0 Allergies Active Allergies lactose Reaction: intolerance Allergy Notes Electronic Signature(s) Signed: 06/21/2020 6:10:01 PM By: Baruch Gouty RN, BSN Entered By: Baruch Gouty on 06/21/2020 13:47:08 -------------------------------------------------------------------------------- Jorge [name] Details Patient Name: Date of Service: Jorge [name], [name] 06/21/2020 1:15 PM Medical Record Number: [id number] Patient Account Number: [id number] Date of Birth/Sex: Treating RN: May 07, 1970 (50 y.o. Male) Baruch Gouty Primary Care Jorge [name]: Sherrie Mustache Other Clinician: Referring Jorge [name]: Treating Jorge [name]/Extender: Deloria Lair in Treatment: 0 Visit Information Patient Arrived: Wheel Chair Arrival Time: 13:34 Accompanied By: self Transfer Assistance: None Patient Identification Verified: Yes Secondary Verification Process Completed: Yes Patient Requires Transmission-Based Precautions: No Patient Has Alerts: No Electronic Signature(s) Signed: 06/21/2020 6:10:01 PM By: Baruch Gouty RN, BSN Entered By: Baruch Gouty on 06/21/2020 13:42:52 -------------------------------------------------------------------------------- Clinic Level of Care Assessment Details Patient Name: Date of Service: [name], [name] 06/21/2020 1:15 PM Medical Record Number: [id number] Patient Account Number: [id number] Date of Birth/Sex: Treating RN: 06/01/1970 (50 y.o. Male) Baruch Gouty Primary Care Jahrel Borthwick: Sherrie Mustache Other Clinician: Referring Jorge [name]: Treating Jorge [name]/Extender: Deloria Lair in Treatment: 0 Clinic Level of Care Assessment Items TOOL 2 Quantity Score '[]'$  - 0 Use when only an EandM is performed on the INITIAL visit ASSESSMENTS - Nursing Assessment / Reassessment X- 1 20 General Physical Exam (combine w/ comprehensive assessment (listed just below) when performed on new pt. evals) X- 1 25 Comprehensive Assessment (HX, ROS, Risk Assessments, Wounds Hx, etc.) ASSESSMENTS - Wound and Skin A ssessment / Reassessment '[]'$  - 0 Simple Wound Assessment / Reassessment - one wound '[]'$  - 0 Complex Wound Assessment / Reassessment - multiple wounds X- 1 10 Dermatologic / Skin Assessment (not related to wound area) ASSESSMENTS - Ostomy and/or Continence Assessment and Care '[]'$  - 0 Incontinence Assessment and Management '[]'$  - 0 Ostomy Care Assessment and Management (repouching, etc.) PROCESS - Coordination of Care X - Simple Patient / Family Education for ongoing care 1 15 '[]'$  - 0 Complex (extensive) Patient / Family Education for ongoing care X- 1 10 Staff obtains Programmer, systems, Records, T Results / Process Orders est X- 1 10 Staff telephones HHA, Nursing Homes / Clarify orders / etc '[]'$  - 0 Routine Transfer to another Facility (non-emergent condition) '[]'$  - 0 Routine Hospital Admission (non-emergent condition) '[]'$  - 0 New Admissions / Biomedical engineer / Ordering NPWT Apligraf, etc. , '[]'$  - 0 Emergency Hospital Admission (emergent condition) X- 1 10 Simple Discharge Coordination '[]'$  - 0 Complex (extensive) Discharge Coordination PROCESS - Special Needs '[]'$  - 0 Pediatric / Minor Patient Management '[]'$  - 0 Isolation Patient Management '[]'$  - 0 Hearing / Language / Visual special needs '[]'$  - 0 Assessment of Community assistance (transportation, D/C planning, etc.) '[]'$  - 0 Additional assistance / Altered mentation '[]'$  - 0 Support Surface(s) Assessment  (bed, cushion, seat, etc.) INTERVENTIONS - Wound Cleansing / Measurement '[]'$  - 0 Wound Imaging (photographs - any number of wounds) '[]'$  - 0 Wound Tracing (instead of photographs) '[]'$  - 0 Simple Wound Measurement - one wound '[]'$  - 0 Complex Wound Measurement - multiple wounds '[]'$  - 0 Simple Wound Cleansing -  one wound '[]'$  - 0 Complex Wound Cleansing - multiple wounds INTERVENTIONS - Wound Dressings '[]'$  - 0 Small Wound Dressing one or multiple wounds '[]'$  - 0 Medium Wound Dressing one or multiple wounds '[]'$  - 0 Large Wound Dressing one or multiple wounds '[]'$  - 0 Application of Medications - injection INTERVENTIONS - Miscellaneous '[]'$  - 0 External ear exam '[]'$  - 0 Specimen Collection (cultures, biopsies, blood, body fluids, etc.) '[]'$  - 0 Specimen(s) / Culture(s) sent or taken to Lab for analysis '[]'$  - 0 Patient Transfer (multiple staff / Harrel Lemon Lift / Similar devices) '[]'$  - 0 Simple Staple / Suture removal (25 or less) '[]'$  - 0 Complex Staple / Suture removal (26 or more) '[]'$  - 0 Hypo / Hyperglycemic Management (close monitor of Blood Glucose) '[]'$  - 0 Ankle / Brachial Index (ABI) - do not check if billed separately Has the patient been seen at the hospital within the last three years: Yes Total Score: 100 Level Of Care: New/Established - Level 3 Electronic Signature(s) Signed: 06/21/2020 6:10:01 PM By: Baruch Gouty RN, BSN Entered By: Baruch Gouty on 06/21/2020 14:17:20 -------------------------------------------------------------------------------- Encounter Discharge Information Details Patient Name: Date of Service: DA Jorge [name], [name] 06/21/2020 1:15 PM Medical Record Number: [id number] Patient Account Number: [id number] Date of Birth/Sex: Treating RN: 03-May-1970 (50 y.o. Male) Baruch Gouty Primary Care Ekansh Sherk: Sherrie Mustache Other Clinician: Referring Feleica Fulmore: Treating Cassy Sprowl/Extender: Deloria Lair in Treatment: 0 Encounter Discharge  Information Items Discharge Condition: Stable Ambulatory Status: Wheelchair Discharge Destination: [place] Telephoned: No Orders Sent: Yes Transportation: Other Accompanied By: sent Schedule Follow-up Appointment: Yes Clinical Summary of Care: Patient Declined Notes facility transportation Electronic Signature(s) Signed: 06/21/2020 6:10:01 PM By: Baruch Gouty RN, BSN Entered By: Baruch Gouty on 06/21/2020 14:20:11 -------------------------------------------------------------------------------- Lower Extremity Assessment Details Patient Name: Date of Service: [name], [name] 06/21/2020 1:15 PM Medical Record Number: [id number] Patient Account Number: [id number] Date of Birth/Sex: Treating RN: [birth year] (50 y.o. Male) Baruch Gouty Primary Care Rina Adney: Sherrie Mustache Other Clinician: Referring Rushi Chasen: Treating Kaavya Puskarich/Extender: Deloria Lair in Treatment: 0 Electronic Signature(s) Signed: 06/21/2020 6:10:01 PM By: Baruch Gouty RN, BSN Entered By: Baruch Gouty on 06/21/2020 14:01:18 -------------------------------------------------------------------------------- Multi Wound Chart Details Patient Name: Date of Service: DA Jorge [name], [name] 06/21/2020 1:15 PM Medical Record Number: [id number] Patient Account Number: [id number] Date of Birth/Sex: Treating RN: [birth year] (50 y.o. Male) Rhae Hammock Primary Care Jahmire Ruffins: Sherrie Mustache Other Clinician: Referring Alroy Portela: Treating Iseah Plouff/Extender: Deloria Lair in Treatment: 0 Vital Signs Height(in): 71 Capillary Blood Glucose(mg/dl): 126 Weight(lbs): 144 Pulse(bpm): 65 Body Mass Index(BMI): 20 Blood Pressure(mmHg): 174/79 Temperature(F): 98.4 Respiratory Rate(breaths/min): 18 Wound Assessments Treatment Notes Electronic Signature(s) Signed: 06/21/2020 4:29:11 PM By: Linton Ham MD Signed: 06/22/2020 6:22:58 PM By:  Rhae Hammock RN Entered By: Linton Ham on 06/21/2020 14:32:02 -------------------------------------------------------------------------------- Patient/Caregiver Education Details Patient Name: Date of Service: DA Jorge [name], DA Duffy Bruce 6/14/2022andnbsp1:15 PM Medical Record Number: [id number] Patient Account Number: [id number] Date of Birth/Gender: Treating RN: 27-Mar-1970 (50 y.o. Male) Baruch Gouty Primary Care Physician: Sherrie Mustache Other Clinician: Referring Physician: Treating Physician/Extender: Deloria Lair in Treatment: 0 Education Assessment Education Provided To: Patient Education Topics Provided Welcome T [facility] [place]: o Handouts: Welcome T [facility] [place] o Methods: Explain/Verbal, Printed Responses: Reinforcements needed, State content correctly Wound/Skin Impairment: Handouts: Caring for Your Ulcer, Skin Care Do's and Dont's Methods: Explain/Verbal, Printed Responses: Reinforcements needed, State content correctly Electronic  Signature(s) Signed: 06/21/2020 6:10:01 PM By: Baruch Gouty RN, BSN Entered By: Baruch Gouty on 06/21/2020 14:15:37 -------------------------------------------------------------------------------- Vitals Details Patient Name: Date of Service: [place], [name] 06/21/2020 1:15 PM Medical Record Number: [id number] Patient Account Number: [id number] Date of Birth/Sex: Treating RN: [birth year] (50 y.o. Male) Baruch Gouty Primary Care Canaan Holzer: Sherrie Mustache Other Clinician: Referring Sallye Lunz: Treating Aitan Rossbach/Extender: Deloria Lair in Treatment: 0 Vital Signs Time Taken: 13:42 Temperature (F): 98.4 Height (in): 71 Pulse (bpm): 65 Source: Stated Respiratory Rate (breaths/min): 18 Weight (lbs): 144 Blood Pressure (mmHg): 174/79 Source: Stated Capillary Blood Glucose (mg/dl): 126 Body Mass Index (BMI): 20.1 Reference Range: 80 - 120 mg /  dl Notes glucose per pt report this am Electronic Signature(s) Signed: 06/21/2020 6:10:01 PM By: Baruch Gouty RN, BSN Entered By: Baruch Gouty on 06/21/2020 13:44:08

## 2020-06-23 ENCOUNTER — Encounter (INDEPENDENT_AMBULATORY_CARE_PROVIDER_SITE_OTHER): Payer: Medicare Other | Admitting: Ophthalmology

## 2020-06-23 ENCOUNTER — Other Ambulatory Visit: Payer: Self-pay

## 2020-06-23 DIAGNOSIS — E103513 Type 1 diabetes mellitus with proliferative diabetic retinopathy with macular edema, bilateral: Secondary | ICD-10-CM

## 2020-06-23 DIAGNOSIS — H2513 Age-related nuclear cataract, bilateral: Secondary | ICD-10-CM | POA: Diagnosis not present

## 2020-06-23 DIAGNOSIS — H43813 Vitreous degeneration, bilateral: Secondary | ICD-10-CM

## 2020-06-23 DIAGNOSIS — H35033 Hypertensive retinopathy, bilateral: Secondary | ICD-10-CM

## 2020-06-23 DIAGNOSIS — I10 Essential (primary) hypertension: Secondary | ICD-10-CM | POA: Diagnosis not present

## 2020-06-28 ENCOUNTER — Encounter: Payer: Self-pay | Admitting: Adult Health

## 2020-06-28 ENCOUNTER — Ambulatory Visit (INDEPENDENT_AMBULATORY_CARE_PROVIDER_SITE_OTHER): Payer: Medicare Other | Admitting: Adult Health

## 2020-06-28 VITALS — BP 169/79 | HR 74 | Ht 71.0 in | Wt 161.0 lb

## 2020-06-28 DIAGNOSIS — E785 Hyperlipidemia, unspecified: Secondary | ICD-10-CM

## 2020-06-28 DIAGNOSIS — E1165 Type 2 diabetes mellitus with hyperglycemia: Secondary | ICD-10-CM

## 2020-06-28 DIAGNOSIS — E1159 Type 2 diabetes mellitus with other circulatory complications: Secondary | ICD-10-CM | POA: Diagnosis not present

## 2020-06-28 DIAGNOSIS — I152 Hypertension secondary to endocrine disorders: Secondary | ICD-10-CM | POA: Diagnosis not present

## 2020-06-28 DIAGNOSIS — G459 Transient cerebral ischemic attack, unspecified: Secondary | ICD-10-CM | POA: Diagnosis not present

## 2020-06-28 DIAGNOSIS — I619 Nontraumatic intracerebral hemorrhage, unspecified: Secondary | ICD-10-CM | POA: Diagnosis not present

## 2020-06-28 NOTE — Patient Instructions (Signed)
Continue aspirin 81 mg daily  and atorvastatin for secondary stroke prevention  Continue to follow up with PCP regarding cholesterol, blood pressure and diabetes management  Maintain strict control of hypertension with blood pressure goal below 130/90, diabetes with hemoglobin A1c goal below 7% and cholesterol with LDL cholesterol (bad cholesterol) goal below 70 mg/dL.  Important to avoid low blood pressure and low glucose levels      Followup in the future with me in 6 months or call earlier if needed       Thank you for coming to see [place] at [facility]. I hope we have been able to provide you high quality care today.  You may receive a patient satisfaction survey over the next few weeks. We would appreciate your feedback and comments so that we may continue to improve ourselves and the health of our patients.
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
## 2020-07-02 DIAGNOSIS — E162 Hypoglycemia, unspecified: Secondary | ICD-10-CM | POA: Diagnosis not present

## 2020-07-02 DIAGNOSIS — R404 Transient alteration of awareness: Secondary | ICD-10-CM | POA: Diagnosis not present

## 2020-07-02 DIAGNOSIS — E161 Other hypoglycemia: Secondary | ICD-10-CM | POA: Diagnosis not present

## 2020-07-07 DIAGNOSIS — Z992 Dependence on renal dialysis: Secondary | ICD-10-CM | POA: Diagnosis not present

## 2020-07-07 DIAGNOSIS — N186 End stage renal disease: Secondary | ICD-10-CM | POA: Diagnosis not present

## 2020-07-07 DIAGNOSIS — E1122 Type 2 diabetes mellitus with diabetic chronic kidney disease: Secondary | ICD-10-CM | POA: Diagnosis not present

## 2020-07-08 DIAGNOSIS — R197 Diarrhea, unspecified: Secondary | ICD-10-CM | POA: Diagnosis not present

## 2020-07-08 DIAGNOSIS — N2581 Secondary hyperparathyroidism of renal origin: Secondary | ICD-10-CM | POA: Diagnosis not present

## 2020-07-08 DIAGNOSIS — N186 End stage renal disease: Secondary | ICD-10-CM | POA: Diagnosis not present

## 2020-07-08 DIAGNOSIS — E1122 Type 2 diabetes mellitus with diabetic chronic kidney disease: Secondary | ICD-10-CM | POA: Diagnosis not present

## 2020-07-08 DIAGNOSIS — Z992 Dependence on renal dialysis: Secondary | ICD-10-CM | POA: Diagnosis not present

## 2020-07-08 NOTE — Progress Notes (Signed)
I agree with the above plan 

## 2020-07-11 DIAGNOSIS — R197 Diarrhea, unspecified: Secondary | ICD-10-CM | POA: Diagnosis not present

## 2020-07-11 DIAGNOSIS — N186 End stage renal disease: Secondary | ICD-10-CM | POA: Diagnosis not present

## 2020-07-11 DIAGNOSIS — Z992 Dependence on renal dialysis: Secondary | ICD-10-CM | POA: Diagnosis not present

## 2020-07-11 DIAGNOSIS — E1122 Type 2 diabetes mellitus with diabetic chronic kidney disease: Secondary | ICD-10-CM | POA: Diagnosis not present

## 2020-07-11 DIAGNOSIS — N2581 Secondary hyperparathyroidism of renal origin: Secondary | ICD-10-CM | POA: Diagnosis not present

## 2020-07-13 ENCOUNTER — Emergency Department (HOSPITAL_COMMUNITY): Payer: Medicare Other

## 2020-07-13 ENCOUNTER — Encounter (HOSPITAL_COMMUNITY): Payer: Self-pay | Admitting: Emergency Medicine

## 2020-07-13 ENCOUNTER — Inpatient Hospital Stay (HOSPITAL_COMMUNITY)
Admission: EM | Admit: 2020-07-13 | Discharge: 2020-07-29 | DRG: 870 | Disposition: A | Discharge status: Skilled Nursing Facility | Payer: Medicare Other | Source: Skilled Nursing Facility | Attending: Internal Medicine | Admitting: Internal Medicine

## 2020-07-13 DIAGNOSIS — G9341 Metabolic encephalopathy: Secondary | ICD-10-CM | POA: Diagnosis not present

## 2020-07-13 DIAGNOSIS — I700 Atherosclerosis of aorta: Secondary | ICD-10-CM | POA: Diagnosis not present

## 2020-07-13 DIAGNOSIS — I629 Nontraumatic intracranial hemorrhage, unspecified: Secondary | ICD-10-CM | POA: Diagnosis not present

## 2020-07-13 DIAGNOSIS — E1159 Type 2 diabetes mellitus with other circulatory complications: Secondary | ICD-10-CM | POA: Diagnosis not present

## 2020-07-13 DIAGNOSIS — Z992 Dependence on renal dialysis: Secondary | ICD-10-CM

## 2020-07-13 DIAGNOSIS — I2510 Atherosclerotic heart disease of native coronary artery without angina pectoris: Secondary | ICD-10-CM | POA: Diagnosis present

## 2020-07-13 DIAGNOSIS — E739 Lactose intolerance, unspecified: Secondary | ICD-10-CM | POA: Diagnosis present

## 2020-07-13 DIAGNOSIS — Z79899 Other long term (current) drug therapy: Secondary | ICD-10-CM

## 2020-07-13 DIAGNOSIS — F419 Anxiety disorder, unspecified: Secondary | ICD-10-CM | POA: Diagnosis present

## 2020-07-13 DIAGNOSIS — Z89512 Acquired absence of left leg below knee: Secondary | ICD-10-CM

## 2020-07-13 DIAGNOSIS — Z9911 Dependence on respirator [ventilator] status: Secondary | ICD-10-CM | POA: Diagnosis not present

## 2020-07-13 DIAGNOSIS — I339 Acute and subacute endocarditis, unspecified: Secondary | ICD-10-CM | POA: Diagnosis not present

## 2020-07-13 DIAGNOSIS — Z7982 Long term (current) use of aspirin: Secondary | ICD-10-CM

## 2020-07-13 DIAGNOSIS — K3184 Gastroparesis: Secondary | ICD-10-CM | POA: Diagnosis present

## 2020-07-13 DIAGNOSIS — E1169 Type 2 diabetes mellitus with other specified complication: Secondary | ICD-10-CM | POA: Diagnosis present

## 2020-07-13 DIAGNOSIS — Q211 Atrial septal defect: Secondary | ICD-10-CM | POA: Diagnosis not present

## 2020-07-13 DIAGNOSIS — G319 Degenerative disease of nervous system, unspecified: Secondary | ICD-10-CM | POA: Diagnosis not present

## 2020-07-13 DIAGNOSIS — F05 Delirium due to known physiological condition: Secondary | ICD-10-CM | POA: Diagnosis present

## 2020-07-13 DIAGNOSIS — I9589 Other hypotension: Secondary | ICD-10-CM | POA: Diagnosis present

## 2020-07-13 DIAGNOSIS — D638 Anemia in other chronic diseases classified elsewhere: Secondary | ICD-10-CM | POA: Diagnosis not present

## 2020-07-13 DIAGNOSIS — R197 Diarrhea, unspecified: Secondary | ICD-10-CM | POA: Diagnosis not present

## 2020-07-13 DIAGNOSIS — G9340 Encephalopathy, unspecified: Secondary | ICD-10-CM

## 2020-07-13 DIAGNOSIS — I517 Cardiomegaly: Secondary | ICD-10-CM | POA: Diagnosis not present

## 2020-07-13 DIAGNOSIS — Z823 Family history of stroke: Secondary | ICD-10-CM

## 2020-07-13 DIAGNOSIS — R401 Stupor: Secondary | ICD-10-CM

## 2020-07-13 DIAGNOSIS — R6889 Other general symptoms and signs: Secondary | ICD-10-CM | POA: Diagnosis not present

## 2020-07-13 DIAGNOSIS — Y95 Nosocomial condition: Secondary | ICD-10-CM | POA: Diagnosis not present

## 2020-07-13 DIAGNOSIS — N186 End stage renal disease: Secondary | ICD-10-CM | POA: Diagnosis present

## 2020-07-13 DIAGNOSIS — E440 Moderate protein-calorie malnutrition: Secondary | ICD-10-CM | POA: Diagnosis not present

## 2020-07-13 DIAGNOSIS — Z0189 Encounter for other specified special examinations: Secondary | ICD-10-CM

## 2020-07-13 DIAGNOSIS — H547 Unspecified visual loss: Secondary | ICD-10-CM | POA: Diagnosis present

## 2020-07-13 DIAGNOSIS — R6521 Severe sepsis with septic shock: Secondary | ICD-10-CM | POA: Diagnosis not present

## 2020-07-13 DIAGNOSIS — Z20822 Contact with and (suspected) exposure to covid-19: Secondary | ICD-10-CM | POA: Diagnosis present

## 2020-07-13 DIAGNOSIS — A419 Sepsis, unspecified organism: Secondary | ICD-10-CM | POA: Diagnosis not present

## 2020-07-13 DIAGNOSIS — E1143 Type 2 diabetes mellitus with diabetic autonomic (poly)neuropathy: Secondary | ICD-10-CM | POA: Diagnosis not present

## 2020-07-13 DIAGNOSIS — R410 Disorientation, unspecified: Secondary | ICD-10-CM

## 2020-07-13 DIAGNOSIS — E875 Hyperkalemia: Secondary | ICD-10-CM | POA: Diagnosis present

## 2020-07-13 DIAGNOSIS — R519 Headache, unspecified: Secondary | ICD-10-CM | POA: Diagnosis not present

## 2020-07-13 DIAGNOSIS — I152 Hypertension secondary to endocrine disorders: Secondary | ICD-10-CM | POA: Diagnosis present

## 2020-07-13 DIAGNOSIS — E785 Hyperlipidemia, unspecified: Secondary | ICD-10-CM | POA: Diagnosis not present

## 2020-07-13 DIAGNOSIS — I160 Hypertensive urgency: Secondary | ICD-10-CM | POA: Diagnosis not present

## 2020-07-13 DIAGNOSIS — I63233 Cerebral infarction due to unspecified occlusion or stenosis of bilateral carotid arteries: Secondary | ICD-10-CM | POA: Diagnosis not present

## 2020-07-13 DIAGNOSIS — J690 Pneumonitis due to inhalation of food and vomit: Secondary | ICD-10-CM | POA: Diagnosis not present

## 2020-07-13 DIAGNOSIS — D696 Thrombocytopenia, unspecified: Secondary | ICD-10-CM | POA: Diagnosis present

## 2020-07-13 DIAGNOSIS — I120 Hypertensive chronic kidney disease with stage 5 chronic kidney disease or end stage renal disease: Secondary | ICD-10-CM | POA: Diagnosis not present

## 2020-07-13 DIAGNOSIS — R4182 Altered mental status, unspecified: Secondary | ICD-10-CM

## 2020-07-13 DIAGNOSIS — J9601 Acute respiratory failure with hypoxia: Secondary | ICD-10-CM | POA: Diagnosis not present

## 2020-07-13 DIAGNOSIS — R404 Transient alteration of awareness: Secondary | ICD-10-CM | POA: Diagnosis not present

## 2020-07-13 DIAGNOSIS — D631 Anemia in chronic kidney disease: Secondary | ICD-10-CM | POA: Diagnosis present

## 2020-07-13 DIAGNOSIS — R591 Generalized enlarged lymph nodes: Secondary | ICD-10-CM | POA: Diagnosis present

## 2020-07-13 DIAGNOSIS — E8770 Fluid overload, unspecified: Secondary | ICD-10-CM | POA: Diagnosis not present

## 2020-07-13 DIAGNOSIS — G459 Transient cerebral ischemic attack, unspecified: Secondary | ICD-10-CM | POA: Diagnosis not present

## 2020-07-13 DIAGNOSIS — Z743 Need for continuous supervision: Secondary | ICD-10-CM | POA: Diagnosis not present

## 2020-07-13 DIAGNOSIS — E1122 Type 2 diabetes mellitus with diabetic chronic kidney disease: Secondary | ICD-10-CM | POA: Diagnosis present

## 2020-07-13 DIAGNOSIS — G030 Nonpyogenic meningitis: Secondary | ICD-10-CM | POA: Diagnosis not present

## 2020-07-13 DIAGNOSIS — J189 Pneumonia, unspecified organism: Secondary | ICD-10-CM

## 2020-07-13 DIAGNOSIS — Z781 Physical restraint status: Secondary | ICD-10-CM

## 2020-07-13 DIAGNOSIS — I493 Ventricular premature depolarization: Secondary | ICD-10-CM | POA: Diagnosis present

## 2020-07-13 DIAGNOSIS — Z794 Long term (current) use of insulin: Secondary | ICD-10-CM

## 2020-07-13 DIAGNOSIS — Z8249 Family history of ischemic heart disease and other diseases of the circulatory system: Secondary | ICD-10-CM

## 2020-07-13 DIAGNOSIS — Z806 Family history of leukemia: Secondary | ICD-10-CM

## 2020-07-13 DIAGNOSIS — I6340 Cerebral infarction due to embolism of unspecified cerebral artery: Secondary | ICD-10-CM | POA: Diagnosis present

## 2020-07-13 DIAGNOSIS — E11649 Type 2 diabetes mellitus with hypoglycemia without coma: Secondary | ICD-10-CM | POA: Diagnosis not present

## 2020-07-13 DIAGNOSIS — E1151 Type 2 diabetes mellitus with diabetic peripheral angiopathy without gangrene: Secondary | ICD-10-CM | POA: Diagnosis present

## 2020-07-13 DIAGNOSIS — E209 Hypoparathyroidism, unspecified: Secondary | ICD-10-CM | POA: Diagnosis present

## 2020-07-13 DIAGNOSIS — D7389 Other diseases of spleen: Secondary | ICD-10-CM | POA: Diagnosis not present

## 2020-07-13 DIAGNOSIS — Z4659 Encounter for fitting and adjustment of other gastrointestinal appliance and device: Secondary | ICD-10-CM

## 2020-07-13 DIAGNOSIS — R0902 Hypoxemia: Secondary | ICD-10-CM | POA: Diagnosis not present

## 2020-07-13 DIAGNOSIS — Z8673 Personal history of transient ischemic attack (TIA), and cerebral infarction without residual deficits: Secondary | ICD-10-CM

## 2020-07-13 DIAGNOSIS — J9690 Respiratory failure, unspecified, unspecified whether with hypoxia or hypercapnia: Secondary | ICD-10-CM | POA: Diagnosis not present

## 2020-07-13 DIAGNOSIS — Z01818 Encounter for other preprocedural examination: Secondary | ICD-10-CM

## 2020-07-13 DIAGNOSIS — Z87891 Personal history of nicotine dependence: Secondary | ICD-10-CM

## 2020-07-13 DIAGNOSIS — E1165 Type 2 diabetes mellitus with hyperglycemia: Secondary | ICD-10-CM | POA: Diagnosis present

## 2020-07-13 DIAGNOSIS — J9811 Atelectasis: Secondary | ICD-10-CM | POA: Diagnosis not present

## 2020-07-13 DIAGNOSIS — Z89511 Acquired absence of right leg below knee: Secondary | ICD-10-CM

## 2020-07-13 DIAGNOSIS — Z452 Encounter for adjustment and management of vascular access device: Secondary | ICD-10-CM | POA: Diagnosis not present

## 2020-07-13 DIAGNOSIS — N2581 Secondary hyperparathyroidism of renal origin: Secondary | ICD-10-CM | POA: Diagnosis not present

## 2020-07-13 DIAGNOSIS — Z833 Family history of diabetes mellitus: Secondary | ICD-10-CM

## 2020-07-13 DIAGNOSIS — Z4682 Encounter for fitting and adjustment of non-vascular catheter: Secondary | ICD-10-CM | POA: Diagnosis not present

## 2020-07-13 DIAGNOSIS — I6389 Other cerebral infarction: Secondary | ICD-10-CM | POA: Diagnosis not present

## 2020-07-13 DIAGNOSIS — Z7401 Bed confinement status: Secondary | ICD-10-CM | POA: Diagnosis not present

## 2020-07-13 DIAGNOSIS — I161 Hypertensive emergency: Secondary | ICD-10-CM | POA: Diagnosis not present

## 2020-07-13 DIAGNOSIS — R1310 Dysphagia, unspecified: Secondary | ICD-10-CM | POA: Diagnosis present

## 2020-07-13 DIAGNOSIS — I10 Essential (primary) hypertension: Secondary | ICD-10-CM | POA: Diagnosis not present

## 2020-07-13 DIAGNOSIS — I639 Cerebral infarction, unspecified: Secondary | ICD-10-CM | POA: Diagnosis not present

## 2020-07-13 DIAGNOSIS — I672 Cerebral atherosclerosis: Secondary | ICD-10-CM | POA: Diagnosis not present

## 2020-07-13 DIAGNOSIS — B952 Enterococcus as the cause of diseases classified elsewhere: Secondary | ICD-10-CM | POA: Diagnosis present

## 2020-07-13 LAB — CBC WITH DIFFERENTIAL/PLATELET
Abs Immature Granulocytes: 0 10*3/uL (ref 0.00–0.07)
Basophils Absolute: 0 10*3/uL (ref 0.0–0.1)
Basophils Relative: 0 %
Eosinophils Absolute: 0.6 10*3/uL — ABNORMAL HIGH (ref 0.0–0.5)
Eosinophils Relative: 9 %
HCT: 38.5 % — ABNORMAL LOW (ref 39.0–52.0)
Hemoglobin: 11.9 g/dL — ABNORMAL LOW (ref 13.0–17.0)
Lymphocytes Relative: 22 %
Lymphs Abs: 1.5 10*3/uL (ref 0.7–4.0)
MCH: 29.9 pg (ref 26.0–34.0)
MCHC: 30.9 g/dL (ref 30.0–36.0)
MCV: 96.7 fL (ref 80.0–100.0)
Monocytes Absolute: 0.7 10*3/uL (ref 0.1–1.0)
Monocytes Relative: 10 %
Neutro Abs: 4.1 10*3/uL (ref 1.7–7.7)
Neutrophils Relative %: 59 %
Platelets: 80 10*3/uL — ABNORMAL LOW (ref 150–400)
RBC: 3.98 MIL/uL — ABNORMAL LOW (ref 4.22–5.81)
RDW: 16.1 % — ABNORMAL HIGH (ref 11.5–15.5)
WBC: 6.9 10*3/uL (ref 4.0–10.5)
nRBC: 0 % (ref 0.0–0.2)
nRBC: 0 /100 WBC

## 2020-07-13 LAB — RESP PANEL BY RT-PCR (FLU A&B, COVID) ARPGX2
Influenza A by PCR: NEGATIVE
Influenza B by PCR: NEGATIVE
SARS Coronavirus 2 by RT PCR: NEGATIVE

## 2020-07-13 LAB — I-STAT VENOUS BLOOD GAS, ED
Acid-Base Excess: 1 mmol/L (ref 0.0–2.0)
Bicarbonate: 24.3 mmol/L (ref 20.0–28.0)
Calcium, Ion: 0.96 mmol/L — ABNORMAL LOW (ref 1.15–1.40)
HCT: 38 % — ABNORMAL LOW (ref 39.0–52.0)
Hemoglobin: 12.9 g/dL — ABNORMAL LOW (ref 13.0–17.0)
O2 Saturation: 99 %
Potassium: 5.5 mmol/L — ABNORMAL HIGH (ref 3.5–5.1)
Sodium: 133 mmol/L — ABNORMAL LOW (ref 135–145)
TCO2: 25 mmol/L (ref 22–32)
pCO2, Ven: 34.7 mmHg — ABNORMAL LOW (ref 44.0–60.0)
pH, Ven: 7.454 — ABNORMAL HIGH (ref 7.250–7.430)
pO2, Ven: 123 mmHg — ABNORMAL HIGH (ref 32.0–45.0)

## 2020-07-13 LAB — CBC
HCT: 38.6 % — ABNORMAL LOW (ref 39.0–52.0)
Hemoglobin: 12 g/dL — ABNORMAL LOW (ref 13.0–17.0)
MCH: 29.9 pg (ref 26.0–34.0)
MCHC: 31.1 g/dL (ref 30.0–36.0)
MCV: 96 fL (ref 80.0–100.0)
Platelets: 96 10*3/uL — ABNORMAL LOW (ref 150–400)
RBC: 4.02 MIL/uL — ABNORMAL LOW (ref 4.22–5.81)
RDW: 16.1 % — ABNORMAL HIGH (ref 11.5–15.5)
WBC: 8.6 10*3/uL (ref 4.0–10.5)
nRBC: 0 % (ref 0.0–0.2)

## 2020-07-13 LAB — BASIC METABOLIC PANEL
Anion gap: 15 (ref 5–15)
BUN: 37 mg/dL — ABNORMAL HIGH (ref 6–20)
CO2: 21 mmol/L — ABNORMAL LOW (ref 22–32)
Calcium: 9.5 mg/dL (ref 8.9–10.3)
Chloride: 97 mmol/L — ABNORMAL LOW (ref 98–111)
Creatinine, Ser: 8.62 mg/dL — ABNORMAL HIGH (ref 0.61–1.24)
GFR, Estimated: 7 mL/min — ABNORMAL LOW (ref >60)
Glucose, Bld: 306 mg/dL — ABNORMAL HIGH (ref 70–99)
Potassium: 6.4 mmol/L (ref 3.5–5.1)
Sodium: 133 mmol/L — ABNORMAL LOW (ref 135–145)

## 2020-07-13 LAB — RENAL FUNCTION PANEL
Albumin: 3.8 g/dL (ref 3.5–5.0)
Anion gap: 19 — ABNORMAL HIGH (ref 5–15)
BUN: 39 mg/dL — ABNORMAL HIGH (ref 6–20)
CO2: 17 mmol/L — ABNORMAL LOW (ref 22–32)
Calcium: 9.3 mg/dL (ref 8.9–10.3)
Chloride: 97 mmol/L — ABNORMAL LOW (ref 98–111)
Creatinine, Ser: 8.55 mg/dL — ABNORMAL HIGH (ref 0.61–1.24)
GFR, Estimated: 7 mL/min — ABNORMAL LOW (ref >60)
Glucose, Bld: 341 mg/dL — ABNORMAL HIGH (ref 70–99)
Phosphorus: 7 mg/dL — ABNORMAL HIGH (ref 2.5–4.6)
Potassium: 6.7 mmol/L (ref 3.5–5.1)
Sodium: 133 mmol/L — ABNORMAL LOW (ref 135–145)

## 2020-07-13 LAB — COMPREHENSIVE METABOLIC PANEL
ALT: 15 U/L (ref 0–44)
AST: 20 U/L (ref 15–41)
Albumin: 3.8 g/dL (ref 3.5–5.0)
Alkaline Phosphatase: 88 U/L (ref 38–126)
Anion gap: 17 — ABNORMAL HIGH (ref 5–15)
BUN: 36 mg/dL — ABNORMAL HIGH (ref 6–20)
CO2: 22 mmol/L (ref 22–32)
Calcium: 9.4 mg/dL (ref 8.9–10.3)
Chloride: 96 mmol/L — ABNORMAL LOW (ref 98–111)
Creatinine, Ser: 8.15 mg/dL — ABNORMAL HIGH (ref 0.61–1.24)
GFR, Estimated: 7 mL/min — ABNORMAL LOW (ref >60)
Glucose, Bld: 263 mg/dL — ABNORMAL HIGH (ref 70–99)
Potassium: 5.4 mmol/L — ABNORMAL HIGH (ref 3.5–5.1)
Sodium: 135 mmol/L (ref 135–145)
Total Bilirubin: 1 mg/dL (ref 0.3–1.2)
Total Protein: 7.7 g/dL (ref 6.5–8.1)

## 2020-07-13 LAB — VITAMIN B12: Vitamin B-12: 482 pg/mL (ref 180–914)

## 2020-07-13 LAB — TSH: TSH: 1.089 u[IU]/mL (ref 0.350–4.500)

## 2020-07-13 LAB — HEMOGLOBIN A1C
Hgb A1c MFr Bld: 6.7 % — ABNORMAL HIGH (ref 4.8–5.6)
Mean Plasma Glucose: 145.59 mg/dL

## 2020-07-13 LAB — PHOSPHORUS: Phosphorus: 6.9 mg/dL — ABNORMAL HIGH (ref 2.5–4.6)

## 2020-07-13 LAB — FOLATE: Folate: 12.6 ng/mL (ref >5.9)

## 2020-07-13 LAB — CBG MONITORING, ED
Glucose-Capillary: 246 mg/dL — ABNORMAL HIGH (ref 70–99)
Glucose-Capillary: 304 mg/dL — ABNORMAL HIGH (ref 70–99)

## 2020-07-13 LAB — MAGNESIUM: Magnesium: 2.3 mg/dL (ref 1.7–2.4)

## 2020-07-13 LAB — ETHANOL: Alcohol, Ethyl (B): <10 mg/dL (ref <10)

## 2020-07-13 IMAGING — DX DG CHEST 1V PORT
1 series · 1 of 1 positions shown · non-contrast
Comparison: Radiographs [DATE] and [DATE]. Neck CTA
[DATE].

CLINICAL DATA: Sudden onset of altered mental status during
hemodialysis today.

EXAM:
PORTABLE CHEST 1 VIEW

[chest]
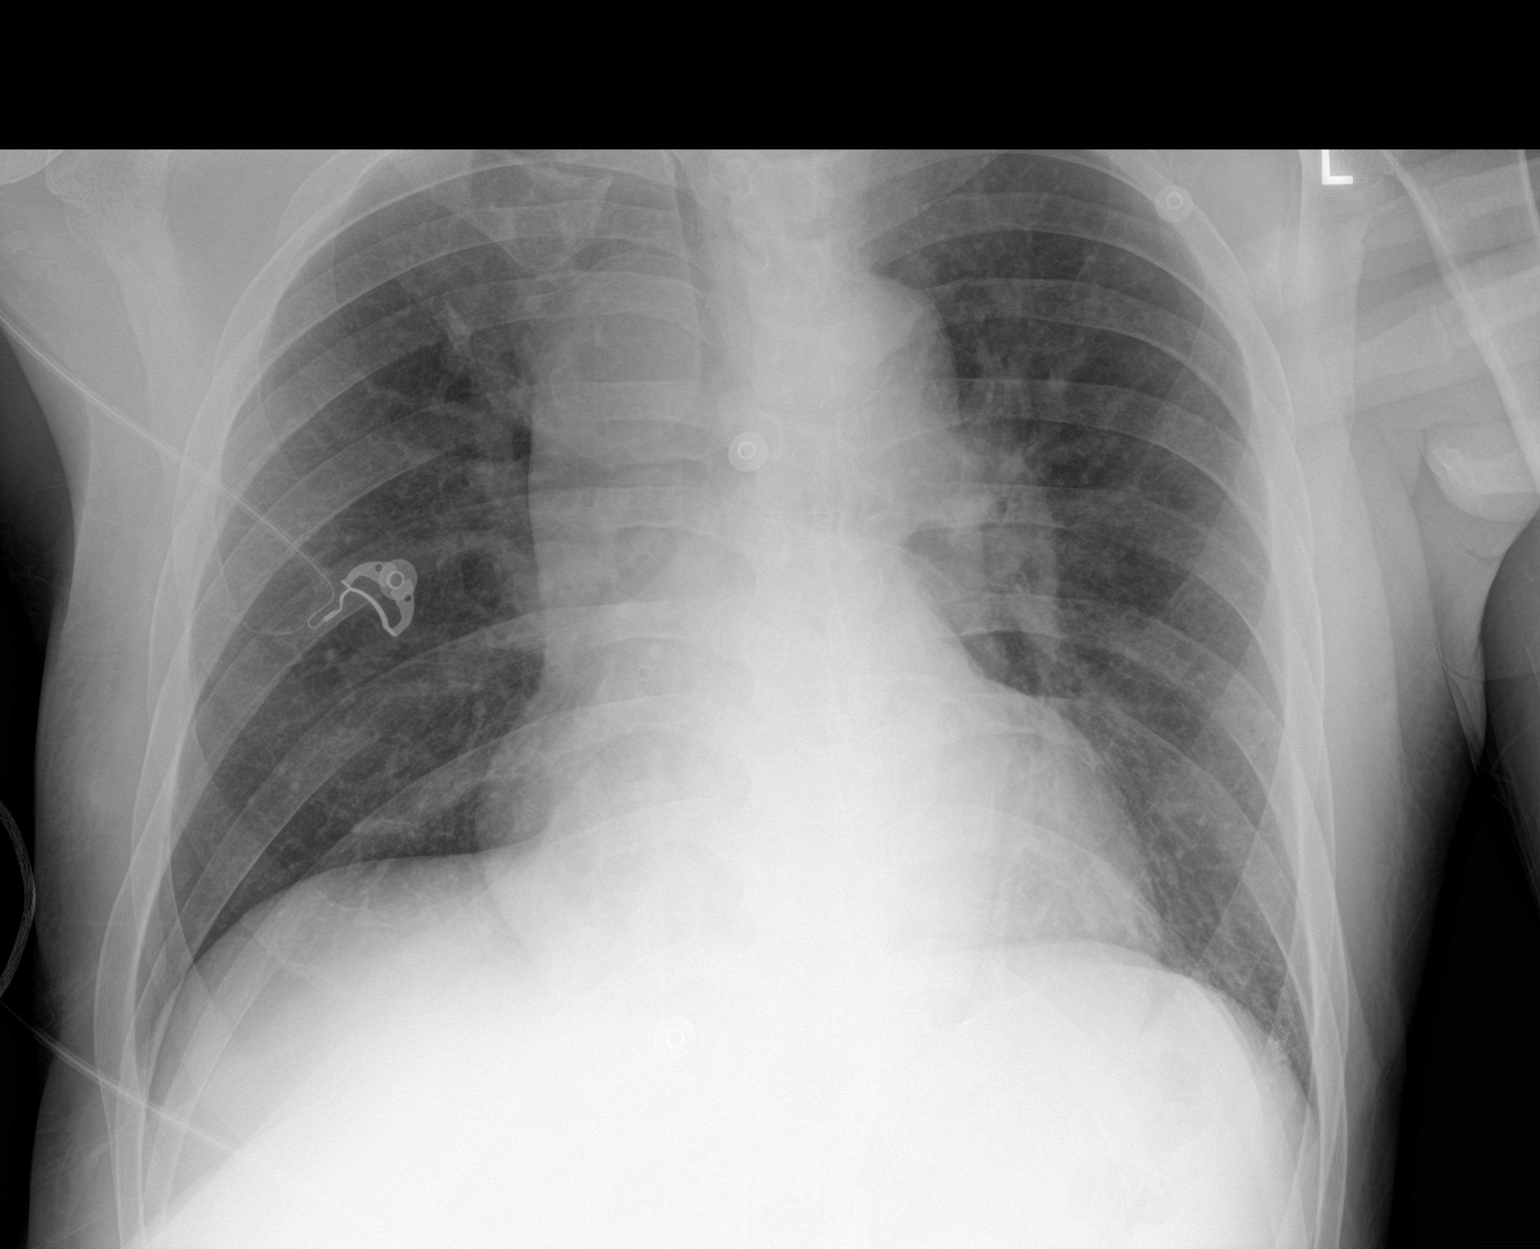

[1 of 1 positions shown; findings below may reference images not displayed]

FINDINGS: [2I] hours. Mild patient rotation to the right. There is stable
cardiomegaly. There is mildly increased right paratracheal soft
tissue prominence which may relate to the rotation. On the prior
CTA, there were prominent lymph nodes and fat in the right
paratracheal region. The lungs appear clear. There is no pleural
effusion or pneumothorax. No acute osseous findings are evident.
IMPRESSION: No definite acute cardiopulmonary process. Increased soft tissue
fullness in the right paratracheal region may relate to patient
rotation. Attention on follow-up recommended; if persistent,
follow-up CT warranted.

## 2020-07-13 IMAGING — CT CT HEAD W/O CM
4 series · 15 of 47 positions shown, 17 images · non-contrast
Comparison: Head CT and brain MRI [DATE] and [DATE]

CLINICAL DATA: Delirium.

EXAM:
CT HEAD WITHOUT CONTRAST
TECHNIQUE: Contiguous axial images were obtained from the base of the skull
through the vertex without intravenous contrast.

[Series 4: head without · axial · non-contrast · 0.53mm/px · z∈[+21,+166]mm · 7 of 39 slices shown, 9 images]
[im 5/39  brain]
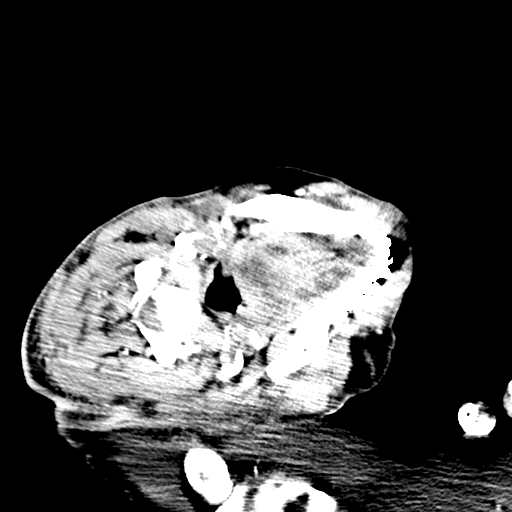
[im 5/39  bone]
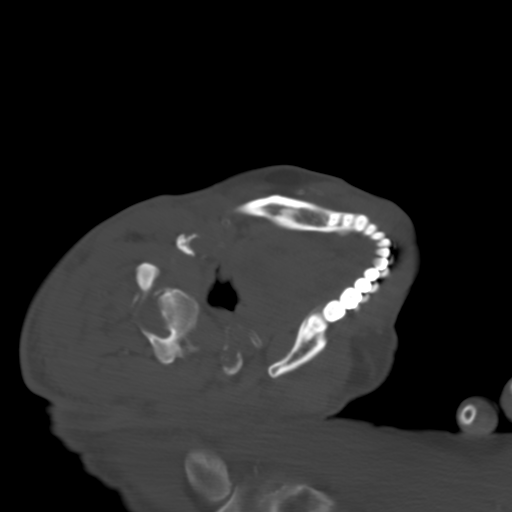
[im 10/39  brain]
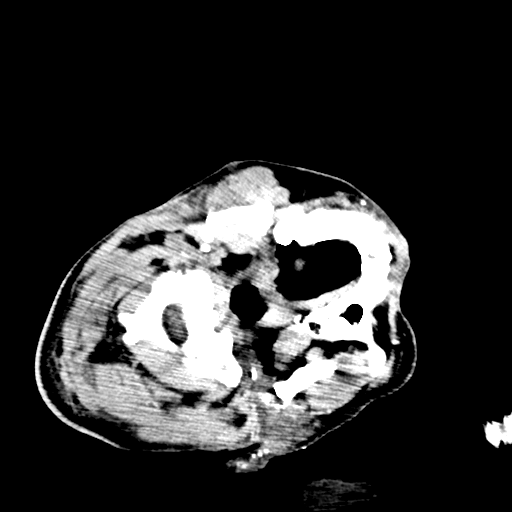
[im 15/39  brain]
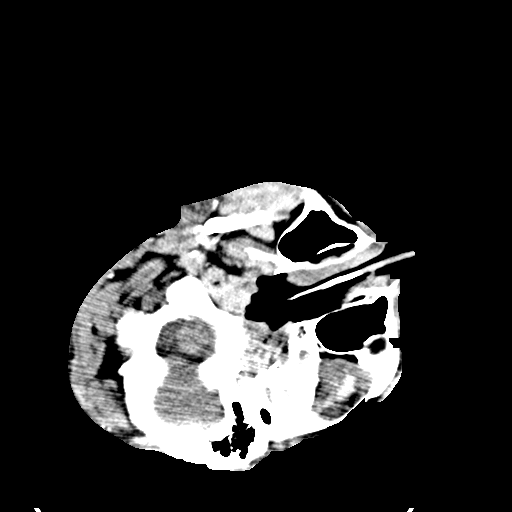
[im 20/39  brain]
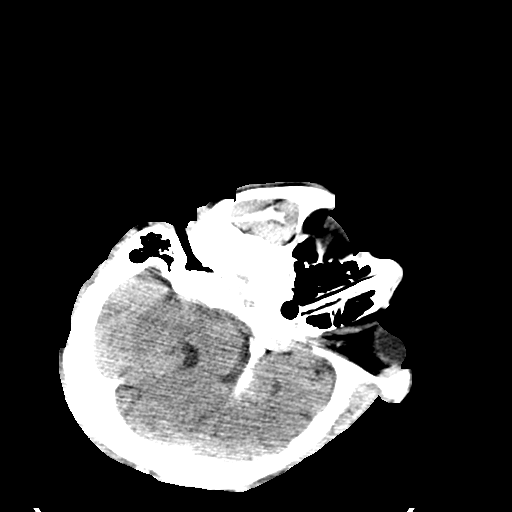
[im 24/39  brain]
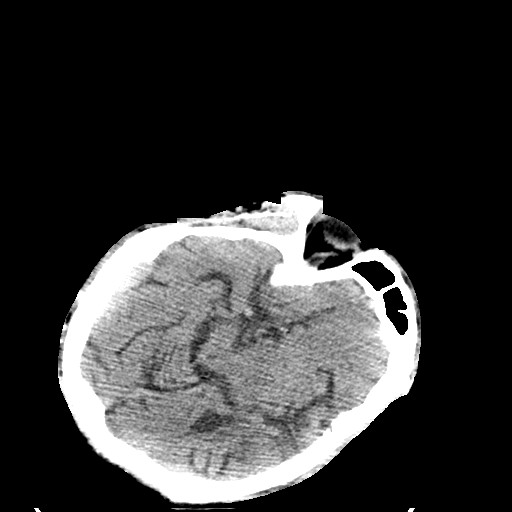
[im 24/39  bone]
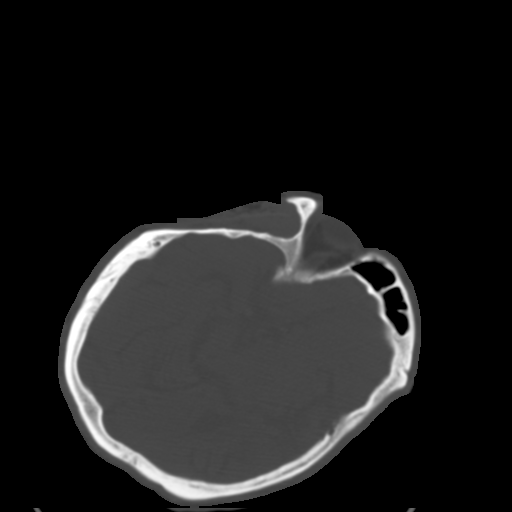
[im 29/39  brain]
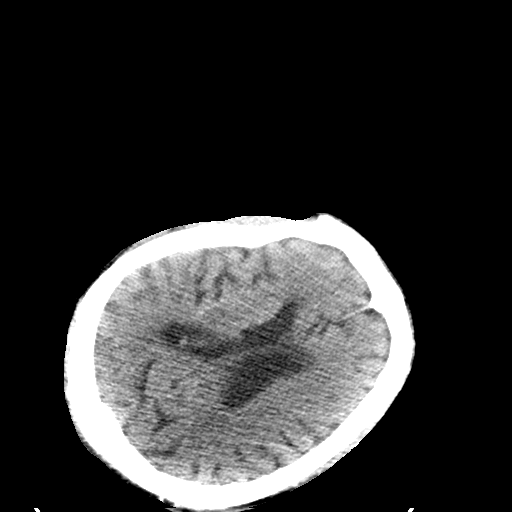
[im 34/39  brain]
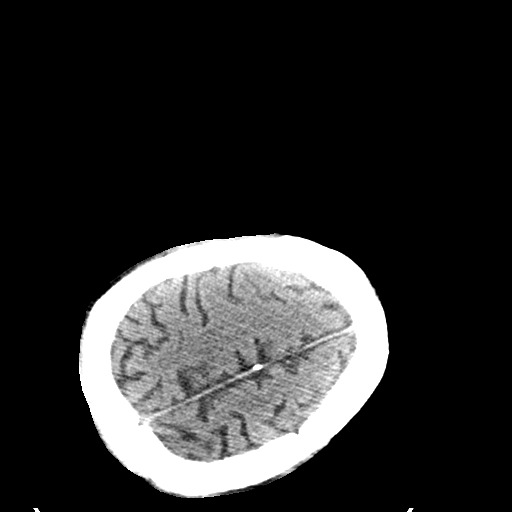

[Series 5: head bone · axial · 0.53mm/px · z∈[+19,+39]mm · 2 of 96 slices shown]
[im 10/96  bone]
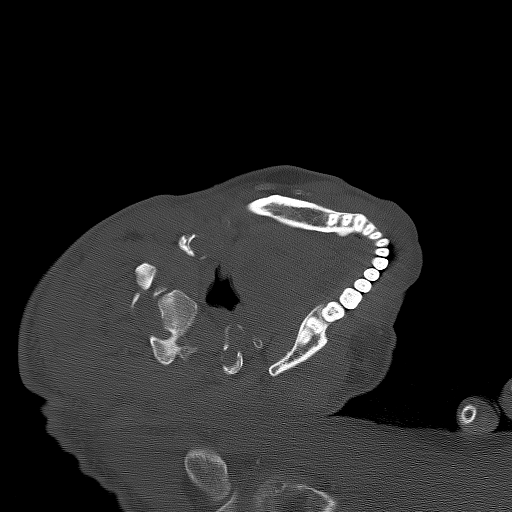
[im 20/96  bone]
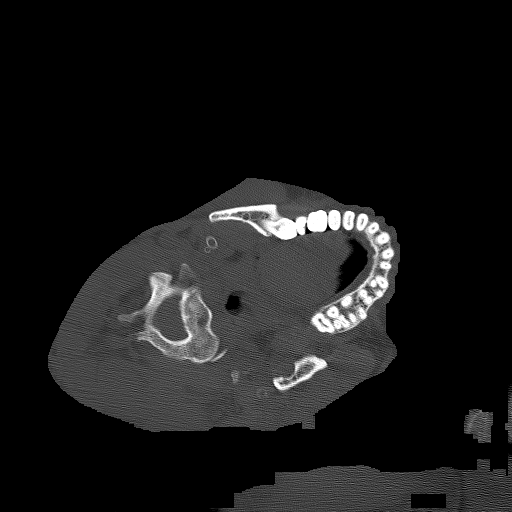

[Series 6: head without sag · coronal · non-contrast · 0.42mm/px · 3 of 83 slices shown]
[im 28/83  brain]
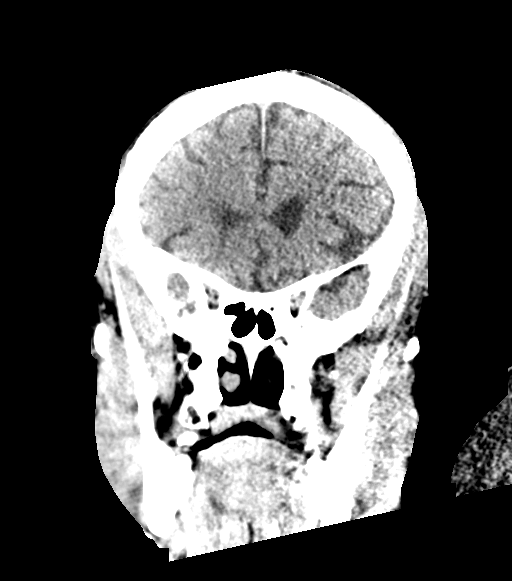
[im 37/83  brain]
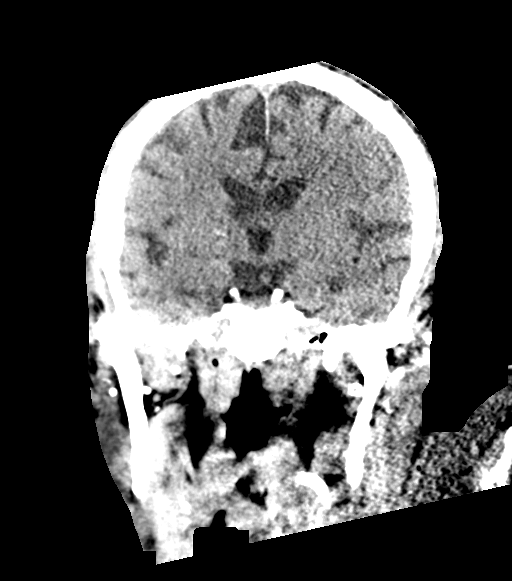
[im 46/83  brain]
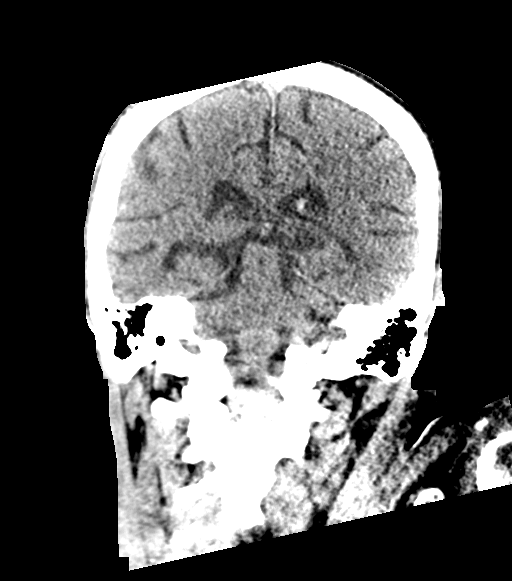

[Series 7: head without cor · sagittal · non-contrast · 0.37mm/px · 3 of 67 slices shown]
[im 29/67  brain]
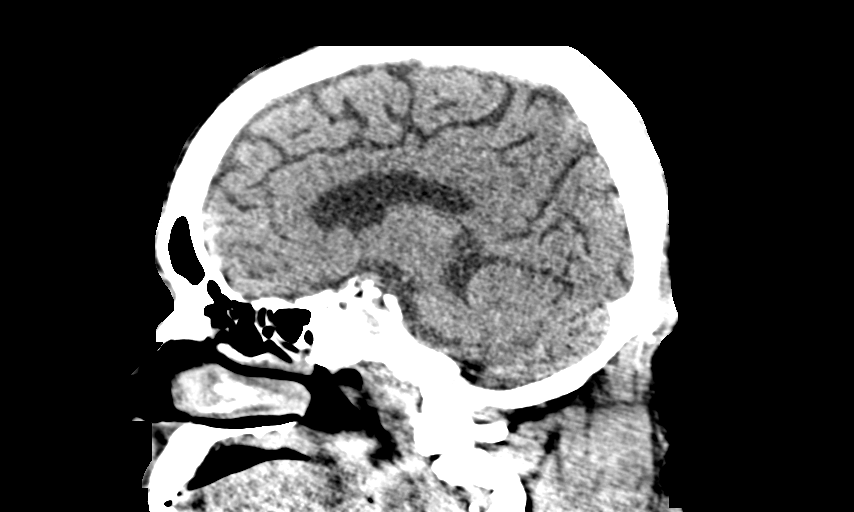
[im 34/67  brain]
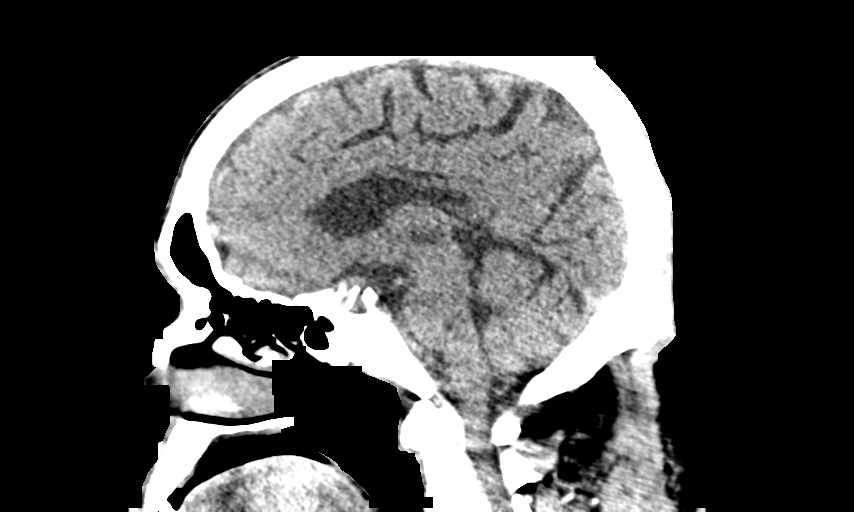
[im 38/67  brain]
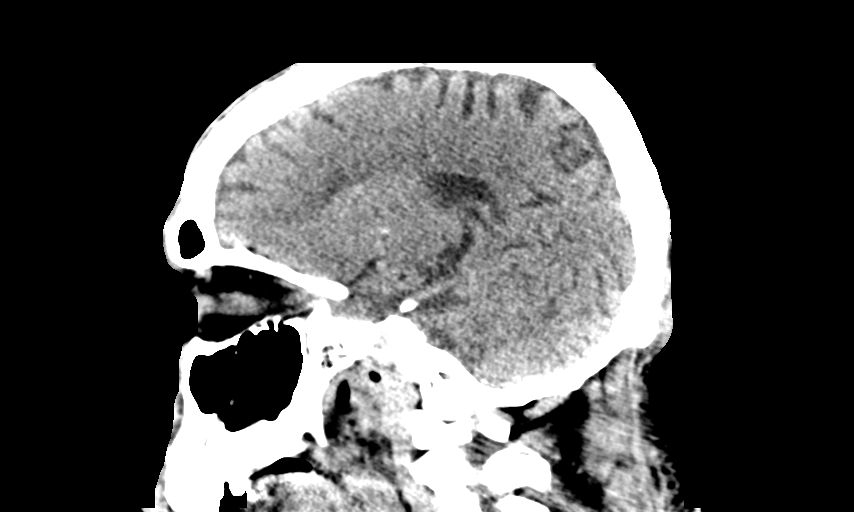

[15 of 47 positions shown; findings below may reference images not displayed]

FINDINGS: Non traditional scan plane limits detailed assessment. There is also
streak and motion artifact. Skull vertex is excluded from the field
of view.

Brain: No evidence of hemorrhage allowing for limitations. Stable
atrophy. Periventricular chronic small vessel ischemia. No
hydrocephalus, midline shift, or mass effect. Remote cerebellar
infarcts on prior MRI are not well defined on the current exam. No
obvious acute ischemia, with evaluation limited by artifact. No
subdural or extra-axial collection.

Vascular: Atherosclerosis of skullbase vasculature without
hyperdense vessel or abnormal calcification.

Skull: No fracture or focal lesion.

Sinuses/Orbits: Paranasal sinuses and mastoid air cells are clear.
The visualized orbits are unremarkable.

Other: None.
IMPRESSION: 1. Technically limited exam as described.
2. Allowing for limitations, no acute intracranial abnormality.
3. Stable atrophy.

## 2020-07-13 MED ORDER — ALTEPLASE 2 MG IJ SOLR: 2.0000 mg | Freq: Once | INTRAMUSCULAR | Status: DC | PRN

## 2020-07-13 MED ORDER — SODIUM CHLORIDE 0.9% FLUSH: 3.0000 mL | INTRAVENOUS | Status: DC | PRN

## 2020-07-13 MED ORDER — LORAZEPAM 2 MG/ML IJ SOLN
1.0000 mg | Freq: Once | INTRAMUSCULAR | Status: AC
  Administered 2020-07-13: 1 mg via INTRAVENOUS
  Filled 2020-07-13: qty 1

## 2020-07-13 MED ORDER — PENTAFLUOROPROP-TETRAFLUOROETH EX AERO
1.0000 "application " | INHALATION_SPRAY | CUTANEOUS | Status: DC | PRN
  Filled 2020-07-13: qty 116

## 2020-07-13 MED ORDER — SODIUM CHLORIDE 0.9 % IV SOLN: 250.0000 mL | INTRAVENOUS | Status: DC | PRN

## 2020-07-13 MED ORDER — LIDOCAINE-PRILOCAINE 2.5-2.5 % EX CREA
1.0000 "application " | TOPICAL_CREAM | CUTANEOUS | Status: DC | PRN
  Filled 2020-07-13: qty 5

## 2020-07-13 MED ORDER — HALOPERIDOL LACTATE 5 MG/ML IJ SOLN
5.0000 mg | Freq: Four times a day (QID) | INTRAMUSCULAR | Status: DC | PRN
  Administered 2020-07-13 – 2020-07-14 (×2): 5 mg via INTRAVENOUS
  Filled 2020-07-13: qty 1

## 2020-07-13 MED ORDER — SODIUM CHLORIDE 0.9 % IV SOLN: 100.0000 mL | INTRAVENOUS | Status: DC | PRN

## 2020-07-13 MED ORDER — CHLORHEXIDINE GLUCONATE CLOTH 2 % EX PADS: 6.0000 | MEDICATED_PAD | Freq: Every day | CUTANEOUS | Status: DC

## 2020-07-13 MED ORDER — INSULIN ASPART 100 UNIT/ML IJ SOLN
0.0000 [IU] | Freq: Three times a day (TID) | INTRAMUSCULAR | Status: DC
Start: 2020-07-14 — End: 2020-07-14

## 2020-07-13 MED ORDER — CHLORHEXIDINE GLUCONATE CLOTH 2 % EX PADS
6.0000 | MEDICATED_PAD | Freq: Every day | CUTANEOUS | Status: DC
  Administered 2020-07-14 – 2020-07-16 (×4): 6 via TOPICAL

## 2020-07-13 MED ORDER — HYDRALAZINE HCL 20 MG/ML IJ SOLN
10.0000 mg | Freq: Once | INTRAMUSCULAR | Status: AC
  Administered 2020-07-13: 10 mg via INTRAVENOUS
  Filled 2020-07-13: qty 1

## 2020-07-13 MED ORDER — SODIUM CHLORIDE 0.9% FLUSH
3.0000 mL | Freq: Two times a day (BID) | INTRAVENOUS | Status: DC
  Administered 2020-07-13 – 2020-07-16 (×6): 3 mL via INTRAVENOUS

## 2020-07-13 MED ORDER — HEPARIN SODIUM (PORCINE) 1000 UNIT/ML DIALYSIS
1000.0000 [IU] | INTRAMUSCULAR | Status: DC | PRN
  Administered 2020-07-29: 1000 [IU] via INTRAVENOUS_CENTRAL
  Filled 2020-07-13: qty 1

## 2020-07-13 MED ORDER — LIDOCAINE HCL (PF) 1 % IJ SOLN: 5.0000 mL | INTRAMUSCULAR | Status: DC | PRN

## 2020-07-13 MED ORDER — HALOPERIDOL LACTATE 5 MG/ML IJ SOLN
0.5000 mg | Freq: Four times a day (QID) | INTRAMUSCULAR | Status: DC | PRN
  Administered 2020-07-13: 0.5 mg via INTRAVENOUS
  Filled 2020-07-13 (×2): qty 1

## 2020-07-13 MED ORDER — SODIUM ZIRCONIUM CYCLOSILICATE 10 G PO PACK: 10.0000 g | PACK | Freq: Once | ORAL | Status: DC

## 2020-07-13 MED ORDER — LABETALOL HCL 5 MG/ML IV SOLN
10.0000 mg | Freq: Once | INTRAVENOUS | Status: AC
  Administered 2020-07-13: 10 mg via INTRAVENOUS
  Filled 2020-07-13: qty 4

## 2020-07-13 MED ORDER — NICARDIPINE HCL IN NACL 20-0.86 MG/200ML-% IV SOLN
3.0000 mg/h | INTRAVENOUS | Status: DC
  Administered 2020-07-14: 5 mg/h via INTRAVENOUS
  Filled 2020-07-13 (×2): qty 200

## 2020-07-13 MED ORDER — HYDRALAZINE HCL 20 MG/ML IJ SOLN
10.0000 mg | Freq: Four times a day (QID) | INTRAMUSCULAR | Status: DC | PRN
  Administered 2020-07-13 – 2020-07-18 (×7): 10 mg via INTRAVENOUS
  Filled 2020-07-13 (×7): qty 1
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
## 2020-07-13 NOTE — ED Notes (Signed)
Dr. Hollie Salk at bedside

## 2020-07-13 NOTE — ED Notes (Signed)
Pt beginning to vomit, MD at bedside, pt is hypertensive, med orders to be put in
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
## 2020-07-13 NOTE — ED Notes (Signed)
Daughter at bedside.  Updated regarding plan of care.  Voices no questions at this time

## 2020-07-13 NOTE — ED Triage Notes (Signed)
Pt here from dialysis 1 hr into treatment with c/o ams , cbg 161

## 2020-07-13 NOTE — ED Notes (Signed)
PT waking up again, crying, stating he is scared, trying to get out of bed.
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
## 2020-07-14 ENCOUNTER — Inpatient Hospital Stay (HOSPITAL_COMMUNITY): Payer: Medicare Other

## 2020-07-14 DIAGNOSIS — G9340 Encephalopathy, unspecified: Secondary | ICD-10-CM | POA: Diagnosis not present

## 2020-07-14 DIAGNOSIS — R401 Stupor: Secondary | ICD-10-CM | POA: Diagnosis not present

## 2020-07-14 DIAGNOSIS — E440 Moderate protein-calorie malnutrition: Secondary | ICD-10-CM | POA: Insufficient documentation

## 2020-07-14 LAB — CBC WITH DIFFERENTIAL/PLATELET
Abs Immature Granulocytes: 0.02 10*3/uL (ref 0.00–0.07)
Abs Immature Granulocytes: 0.06 10*3/uL (ref 0.00–0.07)
Basophils Absolute: 0.1 10*3/uL (ref 0.0–0.1)
Basophils Absolute: 0.1 10*3/uL (ref 0.0–0.1)
Basophils Relative: 1 %
Basophils Relative: 1 %
Eosinophils Absolute: 0.1 10*3/uL (ref 0.0–0.5)
Eosinophils Absolute: 0.4 10*3/uL (ref 0.0–0.5)
Eosinophils Relative: 1 %
Eosinophils Relative: 4 %
HCT: 33.8 % — ABNORMAL LOW (ref 39.0–52.0)
HCT: 35.1 % — ABNORMAL LOW (ref 39.0–52.0)
Hemoglobin: 10.8 g/dL — ABNORMAL LOW (ref 13.0–17.0)
Hemoglobin: 11.2 g/dL — ABNORMAL LOW (ref 13.0–17.0)
Immature Granulocytes: 0 %
Immature Granulocytes: 1 %
Lymphocytes Relative: 23 %
Lymphocytes Relative: 35 %
Lymphs Abs: 2.1 10*3/uL (ref 0.7–4.0)
Lymphs Abs: 3.9 10*3/uL (ref 0.7–4.0)
MCH: 30.3 pg (ref 26.0–34.0)
MCH: 30.4 pg (ref 26.0–34.0)
MCHC: 32 g/dL (ref 30.0–36.0)
MCHC: 31.9 g/dL (ref 30.0–36.0)
MCV: 94.7 fL (ref 80.0–100.0)
MCV: 95.4 fL (ref 80.0–100.0)
Monocytes Absolute: 1.1 10*3/uL — ABNORMAL HIGH (ref 0.1–1.0)
Monocytes Absolute: 1.1 10*3/uL — ABNORMAL HIGH (ref 0.1–1.0)
Monocytes Relative: 12 %
Monocytes Relative: 10 %
Neutro Abs: 5.8 10*3/uL (ref 1.7–7.7)
Neutro Abs: 5.3 10*3/uL (ref 1.7–7.7)
Neutrophils Relative %: 63 %
Neutrophils Relative %: 49 %
Platelets: 82 10*3/uL — ABNORMAL LOW (ref 150–400)
Platelets: 104 10*3/uL — ABNORMAL LOW (ref 150–400)
RBC: 3.57 MIL/uL — ABNORMAL LOW (ref 4.22–5.81)
RBC: 3.68 MIL/uL — ABNORMAL LOW (ref 4.22–5.81)
RDW: 16.5 % — ABNORMAL HIGH (ref 11.5–15.5)
RDW: 16.7 % — ABNORMAL HIGH (ref 11.5–15.5)
WBC: 9.2 10*3/uL (ref 4.0–10.5)
WBC: 10.9 10*3/uL — ABNORMAL HIGH (ref 4.0–10.5)
nRBC: 0 % (ref 0.0–0.2)
nRBC: 0 % (ref 0.0–0.2)

## 2020-07-14 LAB — CSF CELL COUNT WITH DIFFERENTIAL
Eosinophils, CSF: 0 % (ref 0–1)
Eosinophils, CSF: 0 % (ref 0–1)
Lymphs, CSF: 6 % — ABNORMAL LOW (ref 40–80)
Lymphs, CSF: 8 % — ABNORMAL LOW (ref 40–80)
Monocyte-Macrophage-Spinal Fluid: 0 % — ABNORMAL LOW (ref 15–45)
Monocyte-Macrophage-Spinal Fluid: 1 % — ABNORMAL LOW (ref 15–45)
RBC Count, CSF: 1760 /mm3 — ABNORMAL HIGH
RBC Count, CSF: 595 /mm3 — ABNORMAL HIGH
Segmented Neutrophils-CSF: 92 % — ABNORMAL HIGH (ref 0–6)
Segmented Neutrophils-CSF: 93 % — ABNORMAL HIGH (ref 0–6)
Tube #: 1
Tube #: 4
WBC, CSF: 16 /mm3 (ref 0–5)
WBC, CSF: 19 /mm3 (ref 0–5)

## 2020-07-14 LAB — COMPREHENSIVE METABOLIC PANEL
ALT: 13 U/L (ref 0–44)
ALT: 13 U/L (ref 0–44)
AST: 18 U/L (ref 15–41)
AST: 20 U/L (ref 15–41)
Albumin: 3.5 g/dL (ref 3.5–5.0)
Albumin: 3.4 g/dL — ABNORMAL LOW (ref 3.5–5.0)
Alkaline Phosphatase: 75 U/L (ref 38–126)
Alkaline Phosphatase: 71 U/L (ref 38–126)
Anion gap: 15 (ref 5–15)
Anion gap: 12 (ref 5–15)
BUN: 18 mg/dL (ref 6–20)
BUN: 26 mg/dL — ABNORMAL HIGH (ref 6–20)
CO2: 24 mmol/L (ref 22–32)
CO2: 27 mmol/L (ref 22–32)
Calcium: 9.3 mg/dL (ref 8.9–10.3)
Calcium: 9.9 mg/dL (ref 8.9–10.3)
Chloride: 96 mmol/L — ABNORMAL LOW (ref 98–111)
Chloride: 97 mmol/L — ABNORMAL LOW (ref 98–111)
Creatinine, Ser: 5.31 mg/dL — ABNORMAL HIGH (ref 0.61–1.24)
Creatinine, Ser: 6.2 mg/dL — ABNORMAL HIGH (ref 0.61–1.24)
GFR, Estimated: 12 mL/min — ABNORMAL LOW (ref >60)
GFR, Estimated: 10 mL/min — ABNORMAL LOW (ref >60)
Glucose, Bld: 179 mg/dL — ABNORMAL HIGH (ref 70–99)
Glucose, Bld: 69 mg/dL — ABNORMAL LOW (ref 70–99)
Potassium: 4.1 mmol/L (ref 3.5–5.1)
Potassium: 3.9 mmol/L (ref 3.5–5.1)
Sodium: 135 mmol/L (ref 135–145)
Sodium: 136 mmol/L (ref 135–145)
Total Bilirubin: 1.4 mg/dL — ABNORMAL HIGH (ref 0.3–1.2)
Total Bilirubin: 0.8 mg/dL (ref 0.3–1.2)
Total Protein: 7.2 g/dL (ref 6.5–8.1)
Total Protein: 7 g/dL (ref 6.5–8.1)

## 2020-07-14 LAB — MRSA NEXT GEN BY PCR, NASAL: MRSA by PCR Next Gen: DETECTED — AB

## 2020-07-14 LAB — CBC
HCT: 34.6 % — ABNORMAL LOW (ref 39.0–52.0)
Hemoglobin: 11.4 g/dL — ABNORMAL LOW (ref 13.0–17.0)
MCH: 30.2 pg (ref 26.0–34.0)
MCHC: 32.9 g/dL (ref 30.0–36.0)
MCV: 91.8 fL (ref 80.0–100.0)
Platelets: 99 10*3/uL — ABNORMAL LOW (ref 150–400)
RBC: 3.77 MIL/uL — ABNORMAL LOW (ref 4.22–5.81)
RDW: 16.1 % — ABNORMAL HIGH (ref 11.5–15.5)
WBC: 11 10*3/uL — ABNORMAL HIGH (ref 4.0–10.5)
nRBC: 0 % (ref 0.0–0.2)

## 2020-07-14 LAB — BLOOD CULTURE ID PANEL (REFLEXED) - BCID2

## 2020-07-14 LAB — CRYPTOCOCCAL ANTIGEN, CSF: Crypto Ag: NEGATIVE

## 2020-07-14 LAB — GLUCOSE, CAPILLARY
Glucose-Capillary: 251 mg/dL — ABNORMAL HIGH (ref 70–99)
Glucose-Capillary: 145 mg/dL — ABNORMAL HIGH (ref 70–99)
Glucose-Capillary: 136 mg/dL — ABNORMAL HIGH (ref 70–99)
Glucose-Capillary: 345 mg/dL — ABNORMAL HIGH (ref 70–99)
Glucose-Capillary: 188 mg/dL — ABNORMAL HIGH (ref 70–99)
Glucose-Capillary: 239 mg/dL — ABNORMAL HIGH (ref 70–99)
Glucose-Capillary: 171 mg/dL — ABNORMAL HIGH (ref 70–99)
Glucose-Capillary: 61 mg/dL — ABNORMAL LOW (ref 70–99)
Glucose-Capillary: 79 mg/dL (ref 70–99)
Glucose-Capillary: 105 mg/dL — ABNORMAL HIGH (ref 70–99)

## 2020-07-14 LAB — POCT I-STAT 7, (LYTES, BLD GAS, ICA,H+H)
Acid-Base Excess: 3 mmol/L — ABNORMAL HIGH (ref 0.0–2.0)
Bicarbonate: 27.1 mmol/L (ref 20.0–28.0)
Calcium, Ion: 1.25 mmol/L (ref 1.15–1.40)
HCT: 35 % — ABNORMAL LOW (ref 39.0–52.0)
Hemoglobin: 11.9 g/dL — ABNORMAL LOW (ref 13.0–17.0)
O2 Saturation: 97 %
Potassium: 4.3 mmol/L (ref 3.5–5.1)
Sodium: 135 mmol/L (ref 135–145)
TCO2: 28 mmol/L (ref 22–32)
pCO2 arterial: 36.9 mmHg (ref 32.0–48.0)
pH, Arterial: 7.473 — ABNORMAL HIGH (ref 7.350–7.450)
pO2, Arterial: 81 mmHg — ABNORMAL LOW (ref 83.0–108.0)

## 2020-07-14 LAB — TROPONIN I (HIGH SENSITIVITY)
Troponin I (High Sensitivity): 51 ng/L — ABNORMAL HIGH (ref <18)
Troponin I (High Sensitivity): 83 ng/L — ABNORMAL HIGH (ref <18)

## 2020-07-14 LAB — BASIC METABOLIC PANEL
Anion gap: 18 — ABNORMAL HIGH (ref 5–15)
BUN: 41 mg/dL — ABNORMAL HIGH (ref 6–20)
CO2: 18 mmol/L — ABNORMAL LOW (ref 22–32)
Calcium: 9.5 mg/dL (ref 8.9–10.3)
Chloride: 98 mmol/L (ref 98–111)
Creatinine, Ser: 8.95 mg/dL — ABNORMAL HIGH (ref 0.61–1.24)
GFR, Estimated: 7 mL/min — ABNORMAL LOW (ref >60)
Glucose, Bld: 327 mg/dL — ABNORMAL HIGH (ref 70–99)
Potassium: 7.5 mmol/L (ref 3.5–5.1)
Sodium: 134 mmol/L — ABNORMAL LOW (ref 135–145)

## 2020-07-14 LAB — HIV ANTIBODY (ROUTINE TESTING W REFLEX): HIV Screen 4th Generation wRfx: NONREACTIVE

## 2020-07-14 LAB — BETA-HYDROXYBUTYRIC ACID: Beta-Hydroxybutyric Acid: 2.44 mmol/L — ABNORMAL HIGH (ref 0.05–0.27)

## 2020-07-14 LAB — PHOSPHORUS
Phosphorus: 5.1 mg/dL — ABNORMAL HIGH (ref 2.5–4.6)
Phosphorus: 5.9 mg/dL — ABNORMAL HIGH (ref 2.5–4.6)

## 2020-07-14 LAB — PROTEIN AND GLUCOSE, CSF
Glucose, CSF: 117 mg/dL — ABNORMAL HIGH (ref 40–70)
Total  Protein, CSF: 41 mg/dL (ref 15–45)

## 2020-07-14 LAB — MAGNESIUM
Magnesium: 2.1 mg/dL (ref 1.7–2.4)
Magnesium: 2.2 mg/dL (ref 1.7–2.4)

## 2020-07-14 LAB — LACTIC ACID, PLASMA: Lactic Acid, Venous: 1.1 mmol/L (ref 0.5–1.9)

## 2020-07-14 LAB — AMMONIA: Ammonia: 19 umol/L (ref 9–35)

## 2020-07-14 LAB — BRAIN NATRIURETIC PEPTIDE: B Natriuretic Peptide: 1335.2 pg/mL — ABNORMAL HIGH (ref 0.0–100.0)

## 2020-07-14 IMAGING — DX DG CHEST 1V PORT
2 series · 2 of 2 positions shown · non-contrast
Comparison: [DATE].  [DATE].  CT neck [DATE].

CLINICAL DATA: Altered mental status.

EXAM:
PORTABLE CHEST 1 VIEW

[chest ap (1 of 2)]
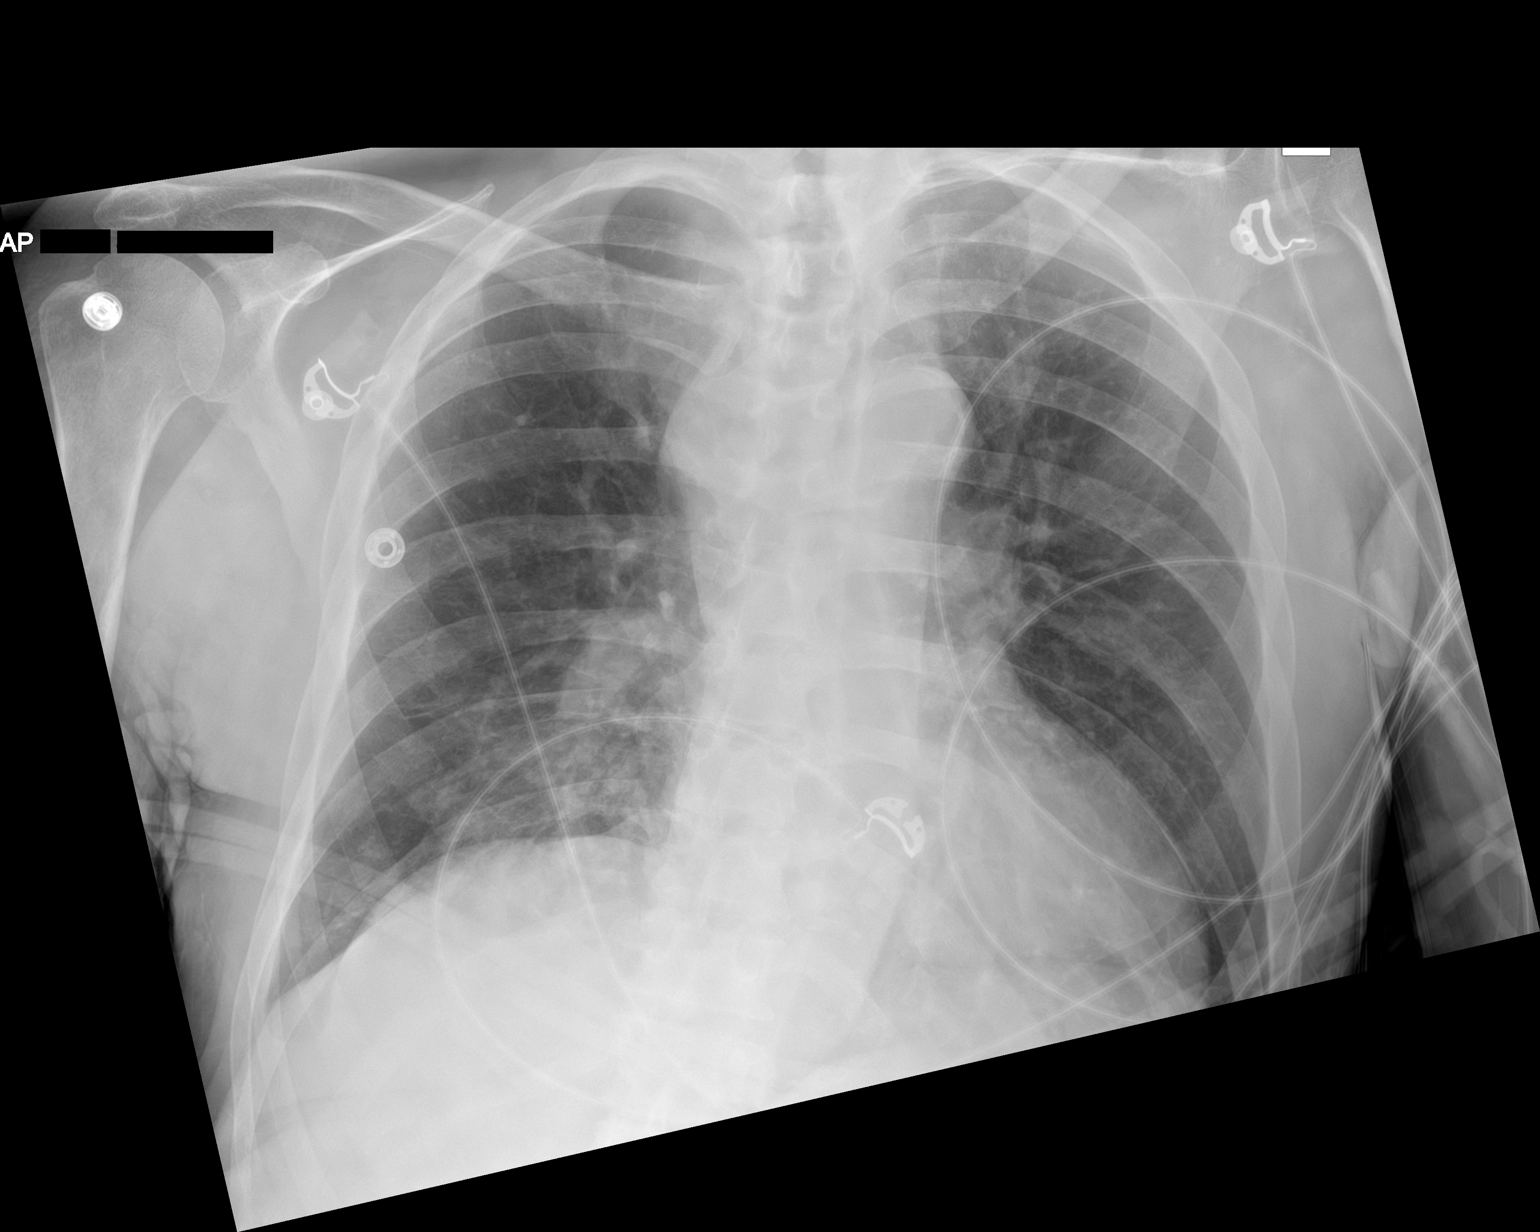

[chest ap (2 of 2)]
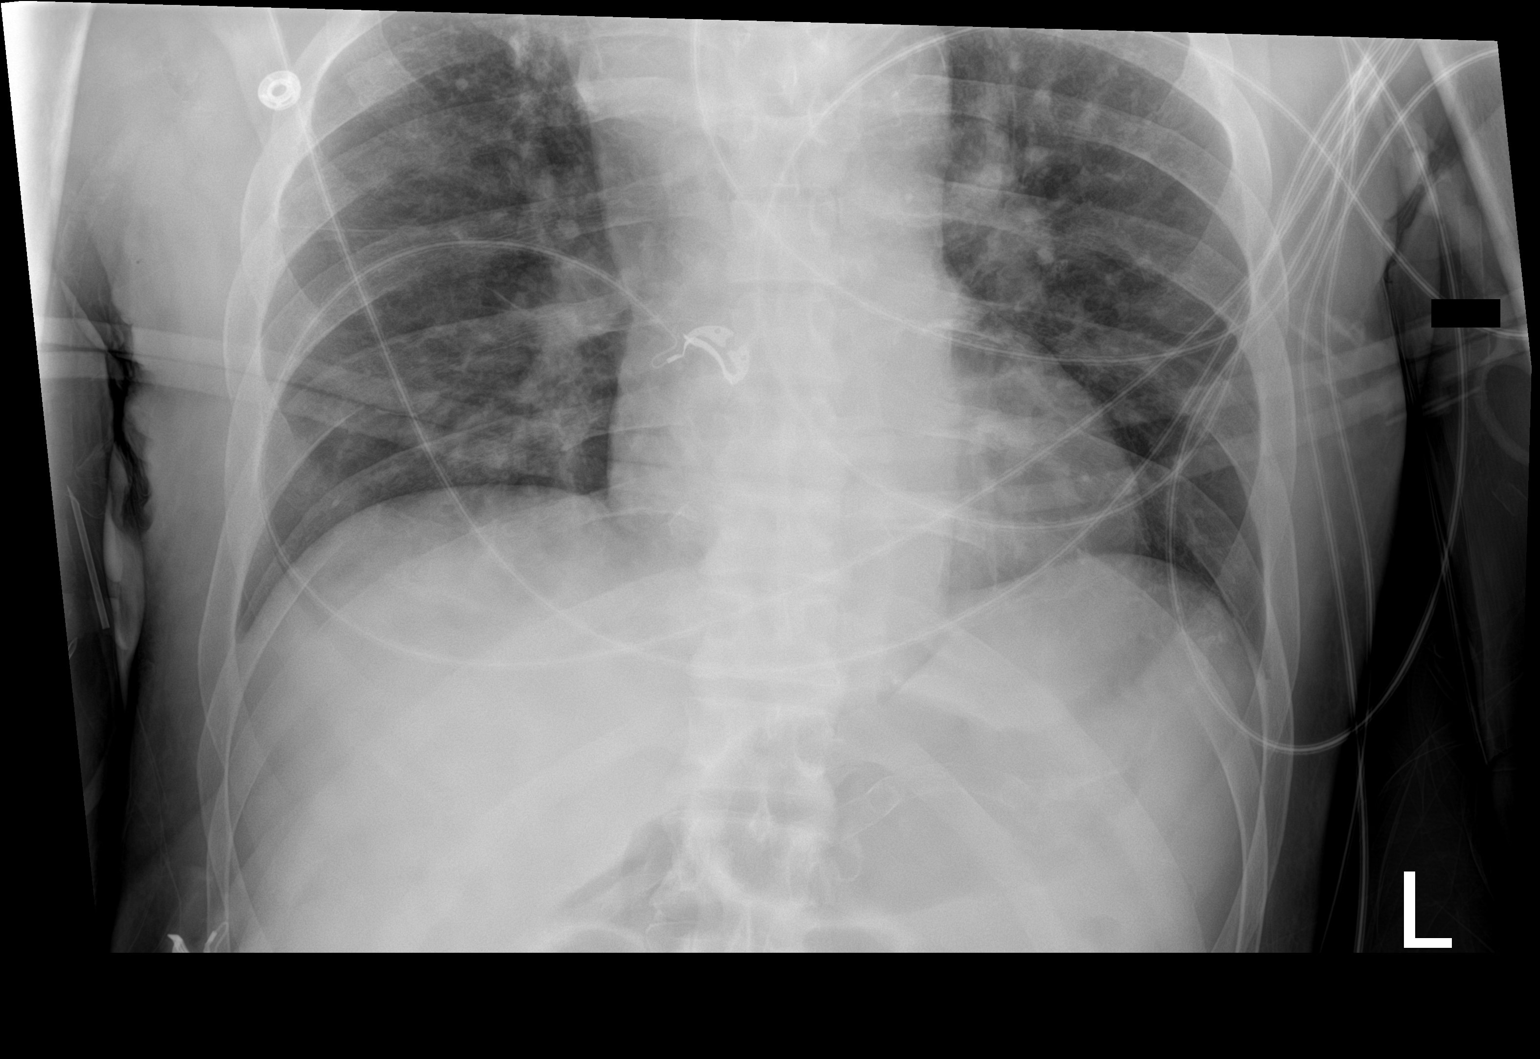

[2 of 2 positions shown; findings below may reference images not displayed]

FINDINGS: Persistent right upper mediastinal soft tissue fullness. Neck CT of
[DATE] revealed mediastinal lymph nodes. Contrast-enhanced CT of
the chest should be considered for further evaluation. Low lung
volumes. Mild right base infiltrate cannot be excluded. No pleural
effusion or pneumothorax. Heart size normal. No acute bony
abnormality.
IMPRESSION: 1. Persistent right upper mediastinal soft tissue fullness
suggesting a mediastinal mass. Neck CT of [DATE] revealed
mediastinal lymph nodes. Contrast-enhanced CT of the chest should be
considered for further evaluation.

2. Low lung volumes. Mild right base infiltrate cannot be excluded.

## 2020-07-14 IMAGING — DX DG CHEST 1V PORT
1 series · 1 of 1 positions shown · non-contrast
Comparison: Same day.

CLINICAL DATA: Orogastric tube placement.

EXAM:
PORTABLE CHEST 1 VIEW

[chest ap]
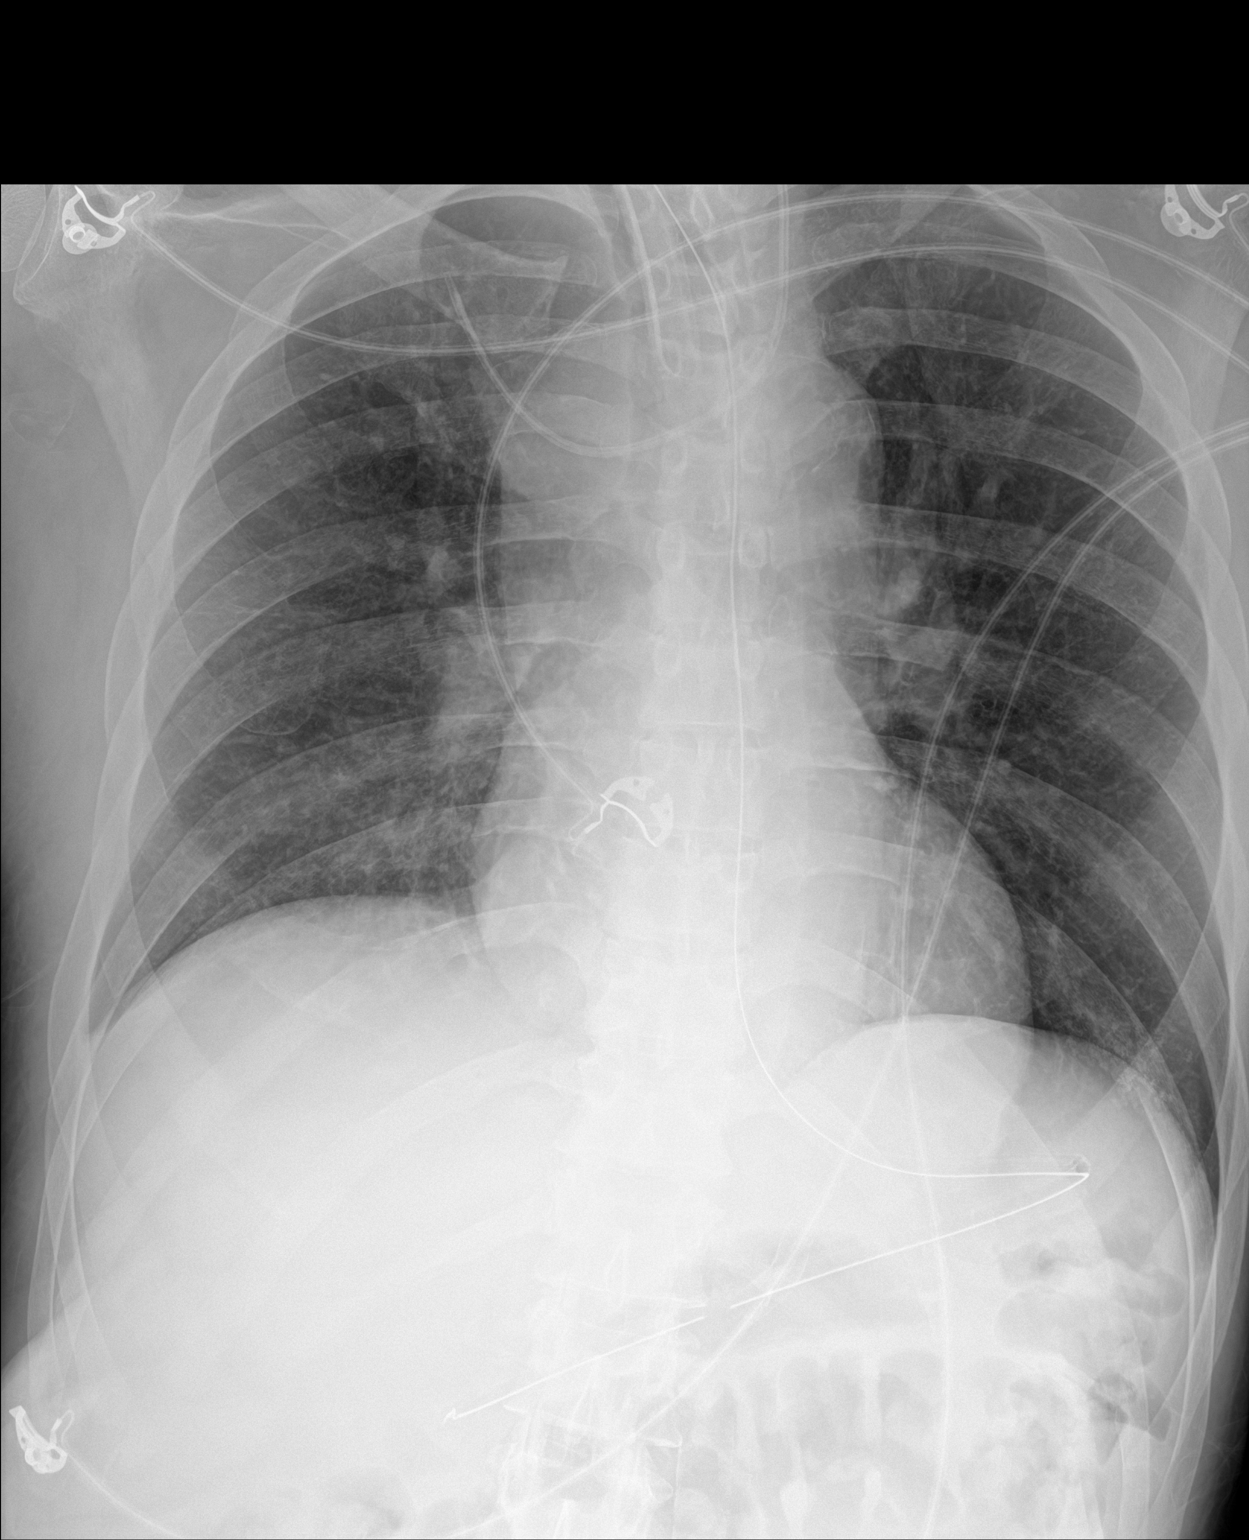

[1 of 1 positions shown; findings below may reference images not displayed]

FINDINGS: Stable cardiac size. Endotracheal tube is in grossly good position.
Nasogastric tube tip is seen in expected position of distal stomach.
Left internal jugular catheter is noted with tip directed into the
right subclavian vein. No pneumothorax or pleural effusion is noted.
IMPRESSION: Endotracheal and nasogastric tubes are in grossly good position.
Left internal jugular catheter is noted with tip directed into the
right subclavian vein; repositioning is recommended. These results
will be called to the ordering clinician or representative by the
Radiologist Assistant, and communication documented in the PACS or
zVision Dashboard.

## 2020-07-14 IMAGING — MR MR HEAD W/O CM
9 of 10 series · 38 of 48 positions shown · non-contrast
Comparison: Head CT yesterday.  MRI [DATE].

CLINICAL DATA: Headache and papilledema. End stage renal disease on
dialysis.

EXAM:
MRI HEAD WITHOUT CONTRAST
TECHNIQUE: Multiplanar, multiecho pulse sequences of the brain and surrounding
structures were obtained without intravenous contrast.

[Series 3: DWI · axial · 3.0mm · 1.09mm/px · z∈[-58,+93]mm · 11 of 108 slices shown (1 of 4)]
[im 1/108]
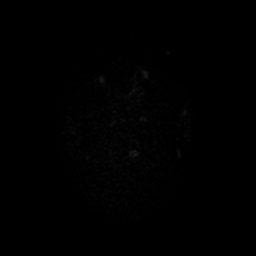
[im 11/108]
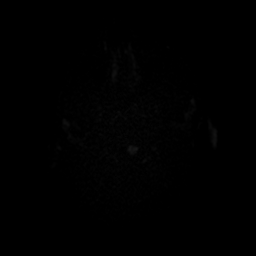
[im 22/108]
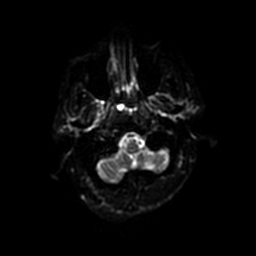
[im 33/108]
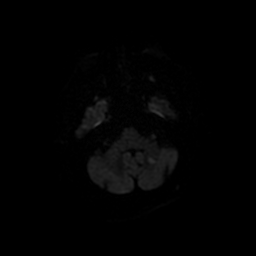
[im 43/108]
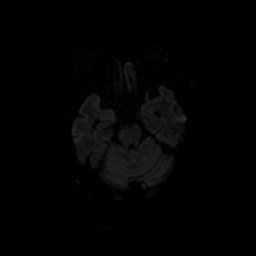
[im 54/108]
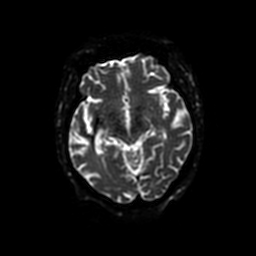
[im 65/108]
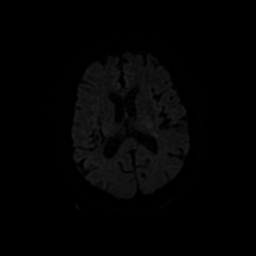
[im 75/108]
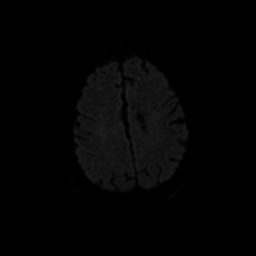
[im 86/108]
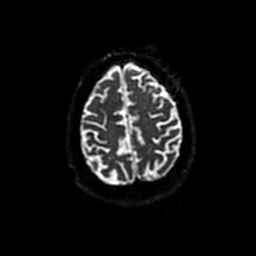
[im 97/108]
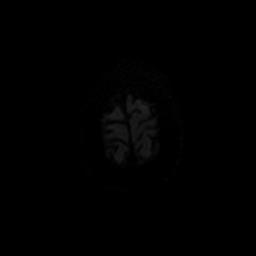
[im 108/108]
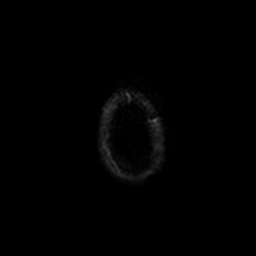

[Series 4: DWI · coronal · 5.0mm · 1.09mm/px · 8 of 80 slices shown (2 of 4)]
[im 1/80]
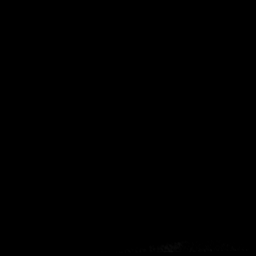
[im 12/80]
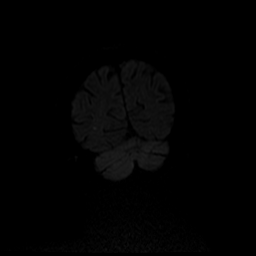
[im 23/80]
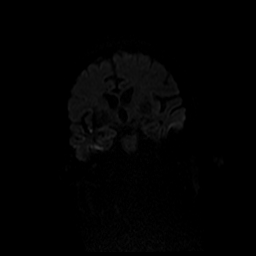
[im 34/80]
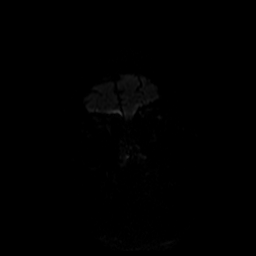
[im 46/80]
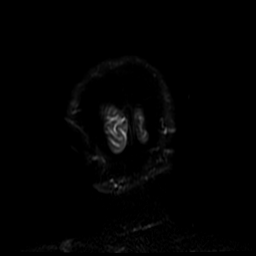
[im 57/80]
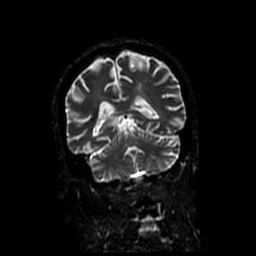
[im 68/80]
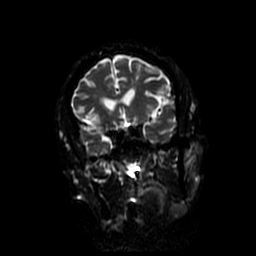
[im 80/80]
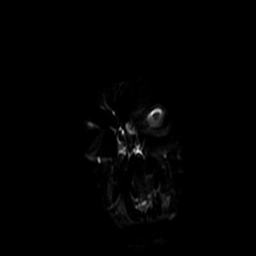

[Series 5: T1 · sagittal · 5.0mm · 0.47mm/px · 2 of 23 slices shown (1 of 2)]
[im 1/23]
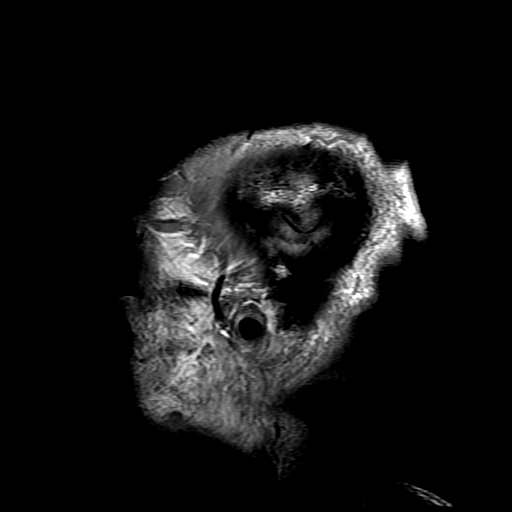
[im 23/23]
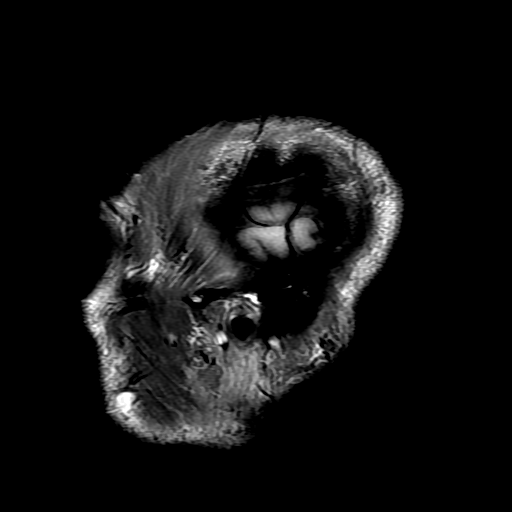

[Series 6: T2 · axial · 5.0mm · 0.43mm/px · z∈[-75,+65]mm · 2 of 25 slices shown (1 of 2)]
[im 1/25]
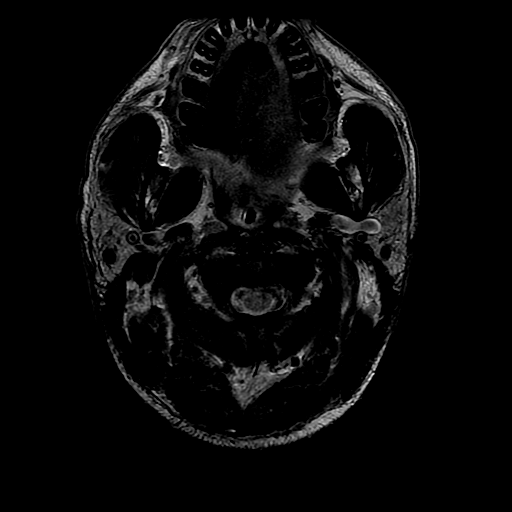
[im 25/25]
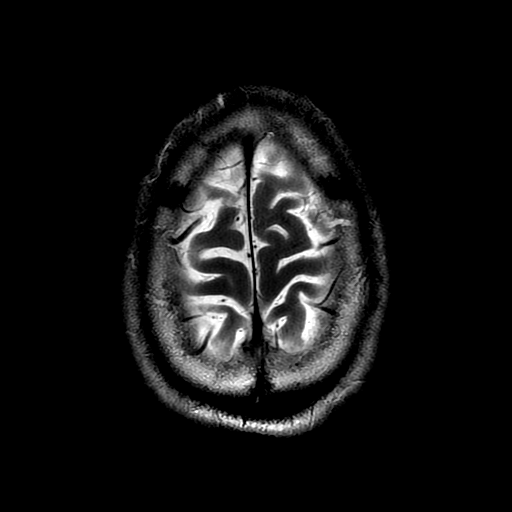

[Series 7: FLAIR · axial · 3.0mm · 0.43mm/px · z∈[-75,+65]mm · 2 of 25 slices shown]
[im 1/25]
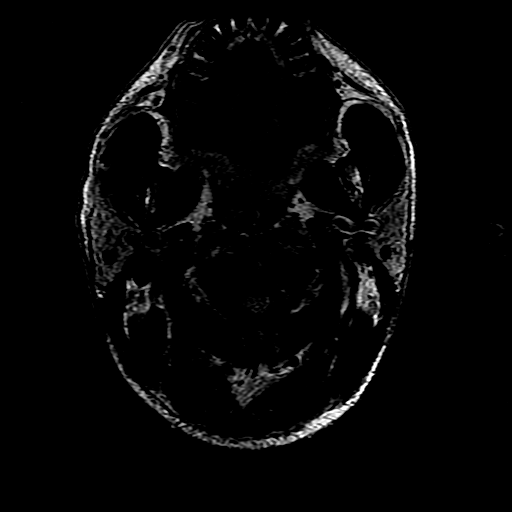
[im 25/25]
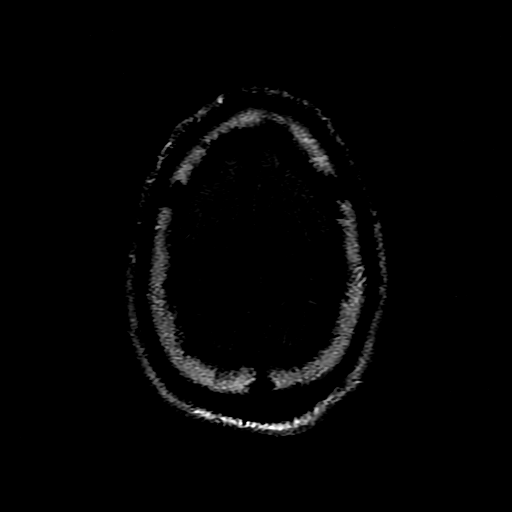

[Series 9: T1 · axial · 3.0mm · 0.43mm/px · z∈[-76,-60]mm · 2 of 100 slices shown (2 of 2)]
[im 1/100]
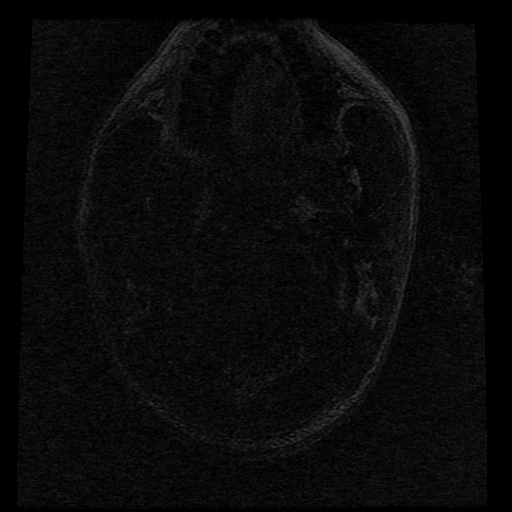
[im 12/100]
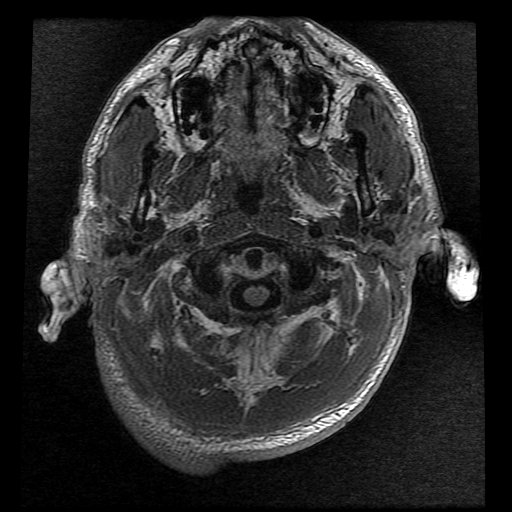

[Series 10: T2 · coronal · 5.0mm · 0.39mm/px · 2 of 25 slices shown (2 of 2)]
[im 1/25]
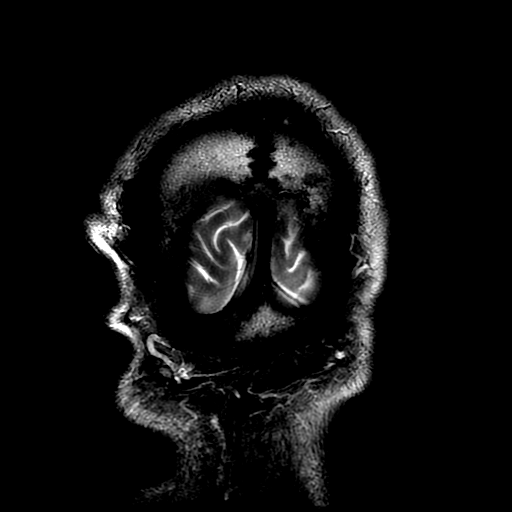
[im 25/25]
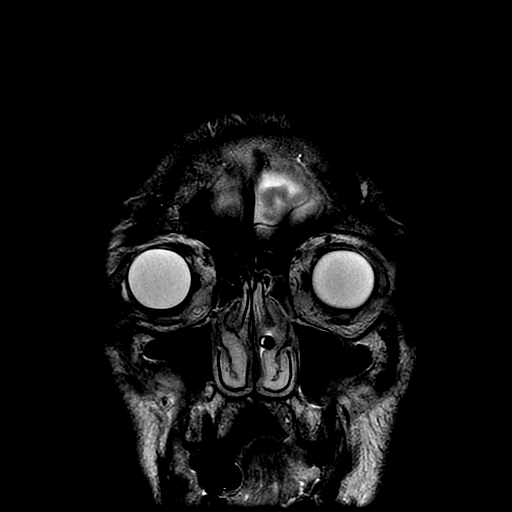

[Series 300: DWI · axial · 3.0mm · 1.09mm/px · z∈[-58,+93]mm · 5 of 54 slices shown (3 of 4)]
[im 1/54]
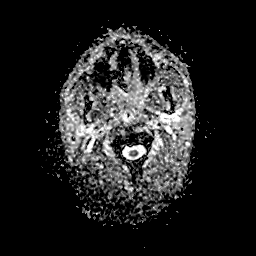
[im 14/54]
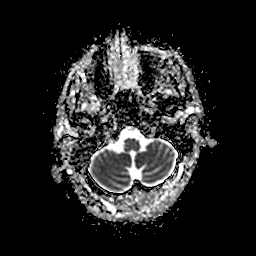
[im 27/54]
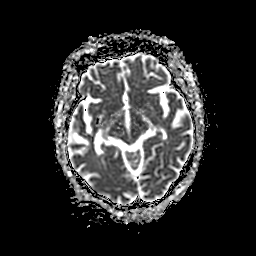
[im 40/54]
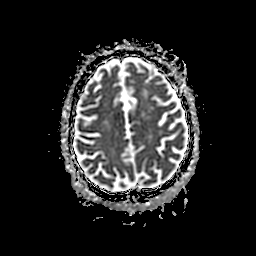
[im 54/54]
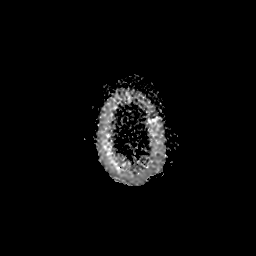

[Series 400: DWI · coronal · 5.0mm · 1.09mm/px · 4 of 40 slices shown (4 of 4)]
[im 1/40]
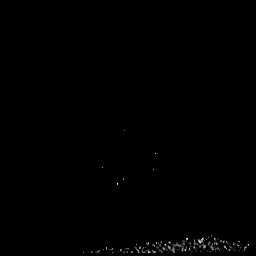
[im 14/40]
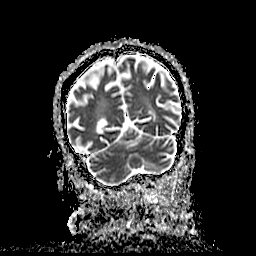
[im 27/40]
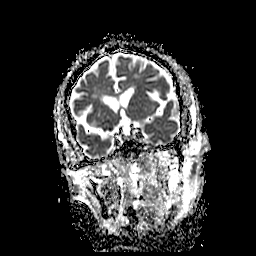
[im 40/40]
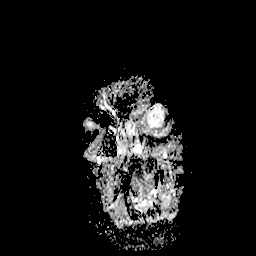

[38 of 48 positions shown; findings below may reference images not displayed]

FINDINGS: Brain: Diffusion imaging shows a 7 mm acute infarction within the
left cerebellum centrally. Punctate acute infarction in the right
inferior posterior parietal white matter. Two small linear acute
infarctions within the left frontal white matter. No large vessel
territory acute infarction. Chronic small-vessel ischemic changes
affect the pons. Several old small vessel cerebellar infarctions.
Cerebral hemispheres show moderate chronic small-vessel disease of
the deep white matter, old hemorrhagic infarction in the right
external capsule region, an old small cortical and subcortical left
frontal infarction. No evidence of mass, hydrocephalus or
extra-axial collection.

Vascular: Major vessels at the base of the brain show flow.

Skull and upper cervical spine: Negative. Marrow changes likely
related to chronic hemodialysis.

Sinuses/Orbits: Clear/normal

Other: None
IMPRESSION: 4 small acute infarctions, left cerebellar white matter, right
posteroinferior parietal white matter and 2 in the left frontal
white matter. Findings could be due to embolic disease or concurrent
ordinary small-vessel infarctions.

Atrophy and extensive chronic small-vessel ischemic changes
elsewhere. Old hemorrhagic infarction in the right external capsule.
Old left frontal cortical and subcortical infarction.

No abnormality seen to explain papilledema. No evidence of mass
lesion or acute hemorrhage.

## 2020-07-14 IMAGING — CT CT CHEST W/O CM
2 of 3 series · 15 of 36 positions shown, 18 images · non-contrast
Comparison: Radiographs of same day.

CLINICAL DATA: Mediastinal mass.

EXAM:
CT CHEST WITHOUT CONTRAST
TECHNIQUE: Multidetector CT imaging of the chest was performed following the
standard protocol without IV contrast.

[Series 3: chest w/o 2mm st · axial · non-contrast · 0.71mm/px · z∈[+854,+1120]mm · 12 of 157 slices shown, 15 images]
[im 12/157  mediastinal]
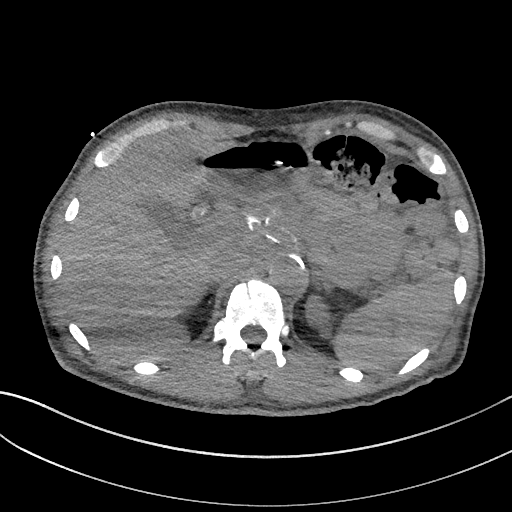
[im 12/157  lung]
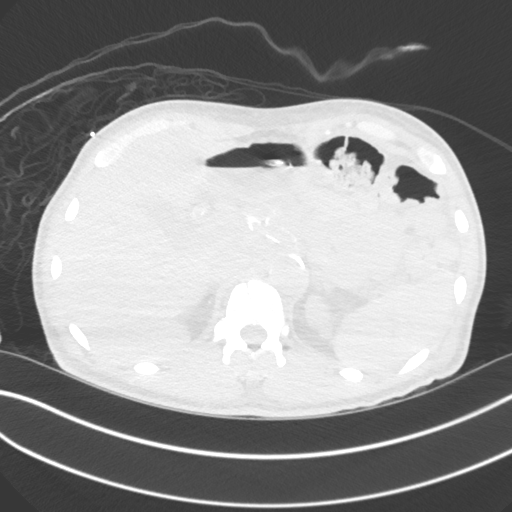
[im 24/157  lung]
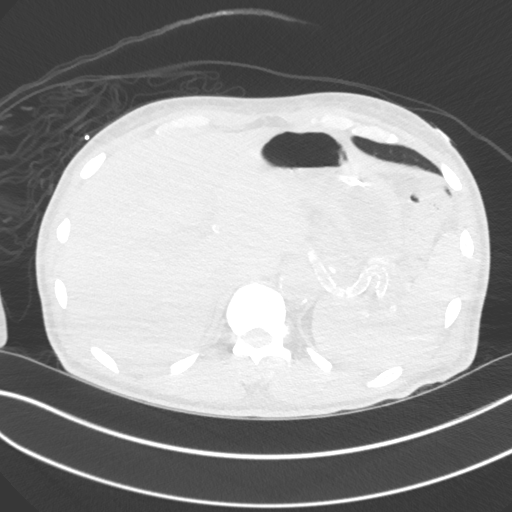
[im 35/157  lung]
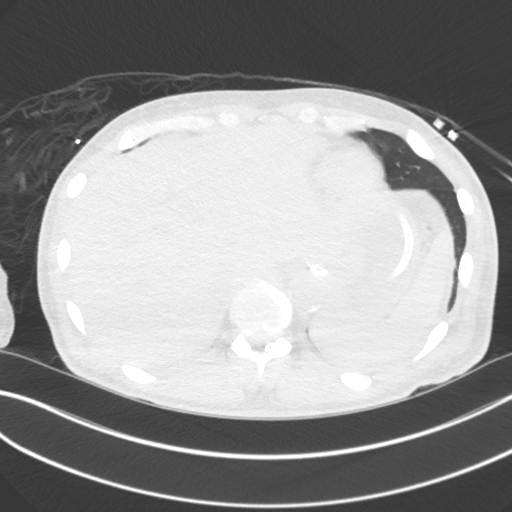
[im 47/157  lung]
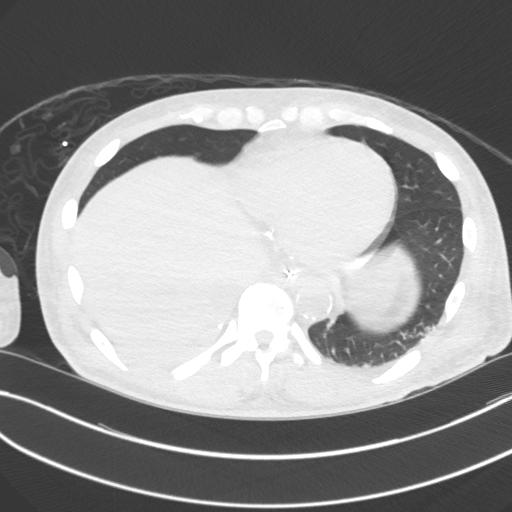
[im 58/157  mediastinal]
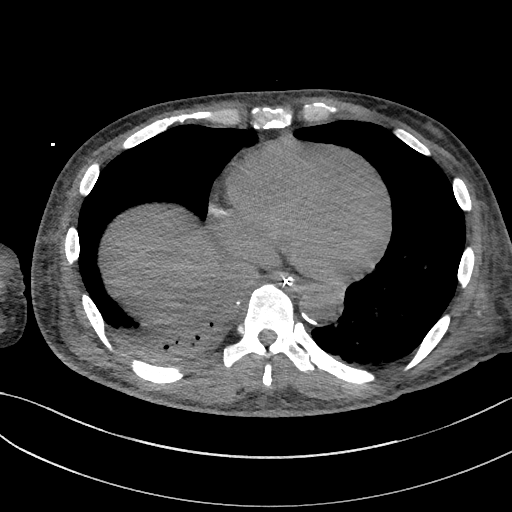
[im 58/157  lung]
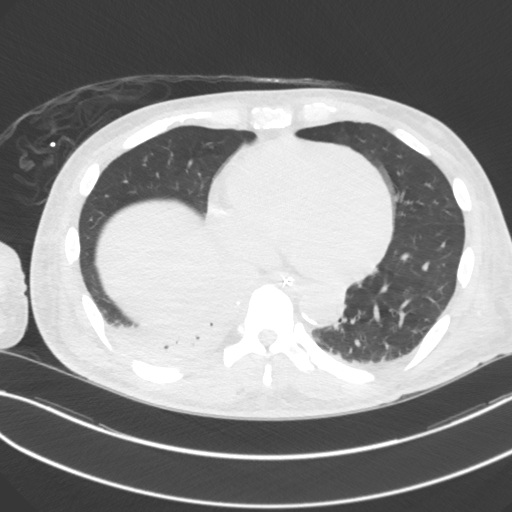
[im 70/157  lung]
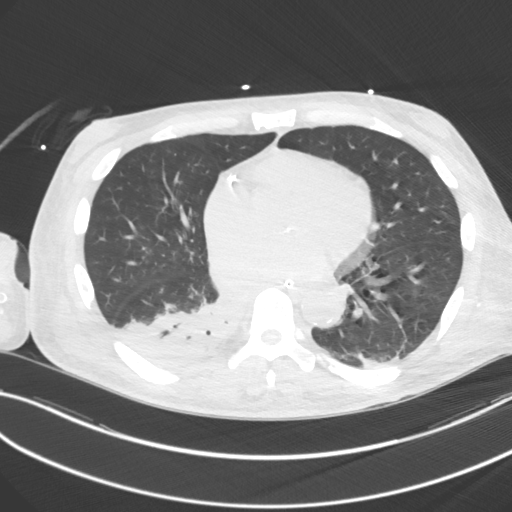
[im 87/157  lung]
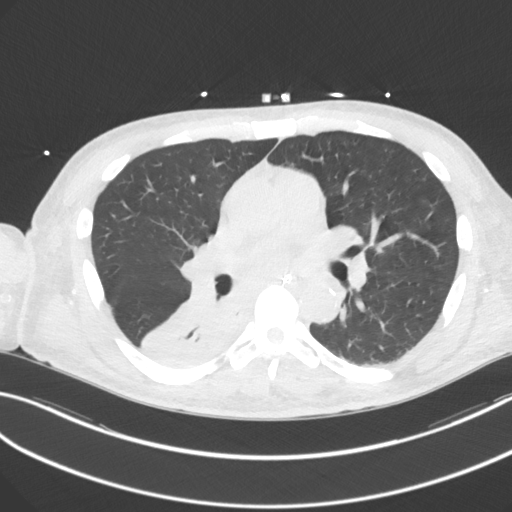
[im 99/157  lung]
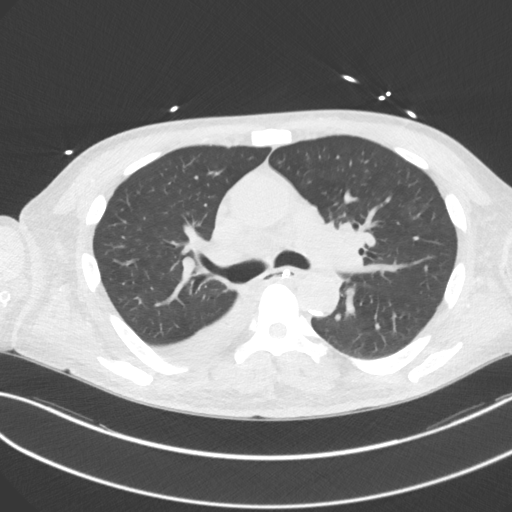
[im 110/157  mediastinal]
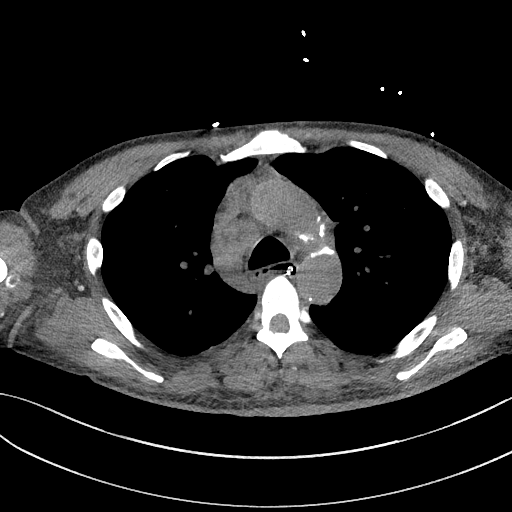
[im 110/157  lung]
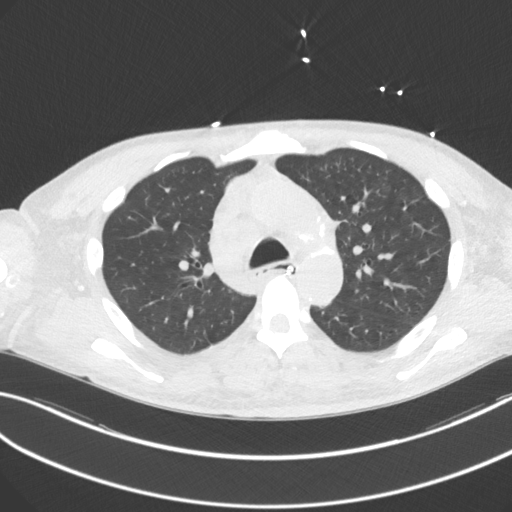
[im 122/157  lung]
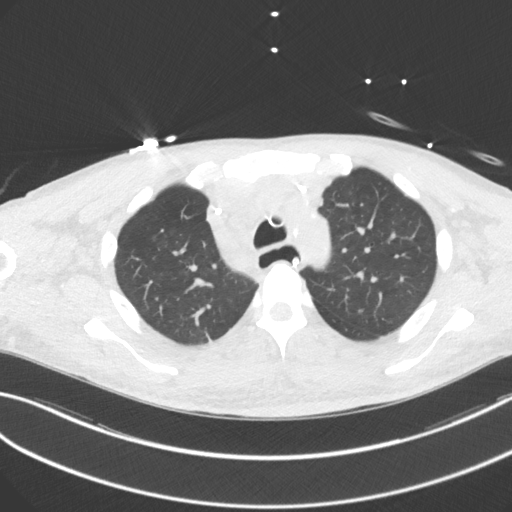
[im 133/157  lung]
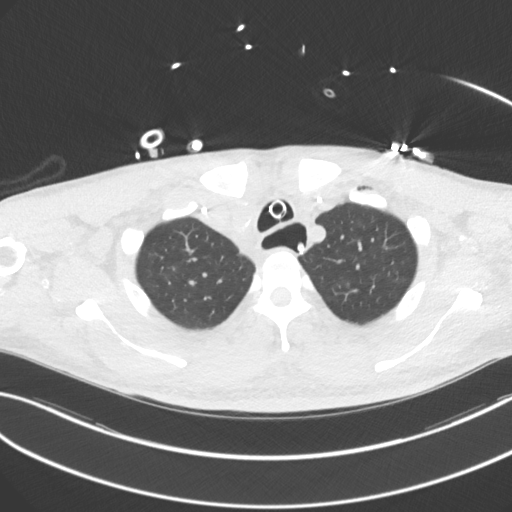
[im 145/157  lung]
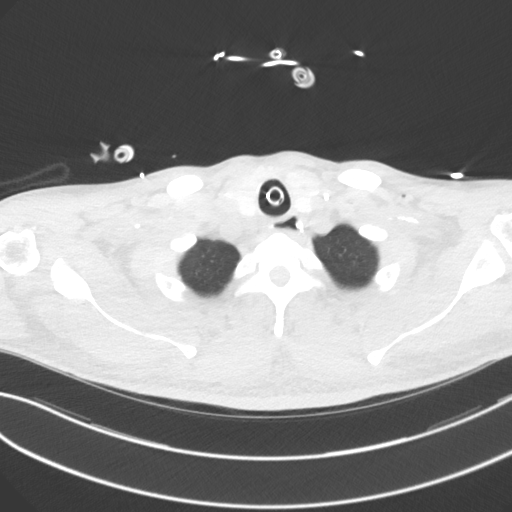

[Series 6: chest w/o 2mm st cor · coronal · non-contrast · 0.64mm/px · 3 of 109 slices shown]
[im 22/109  lung]
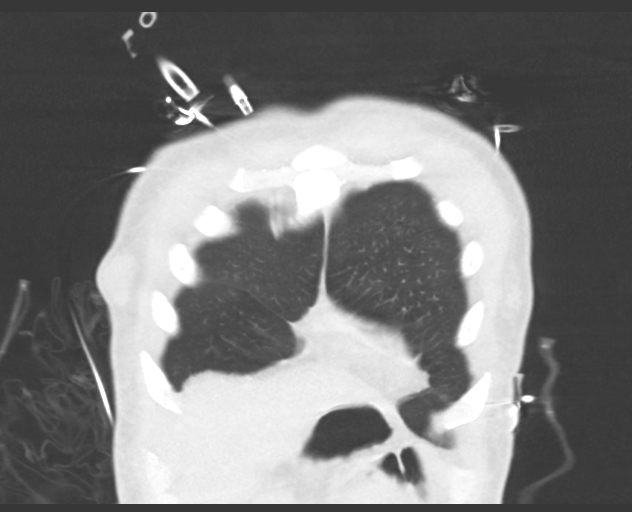
[im 44/109  lung]
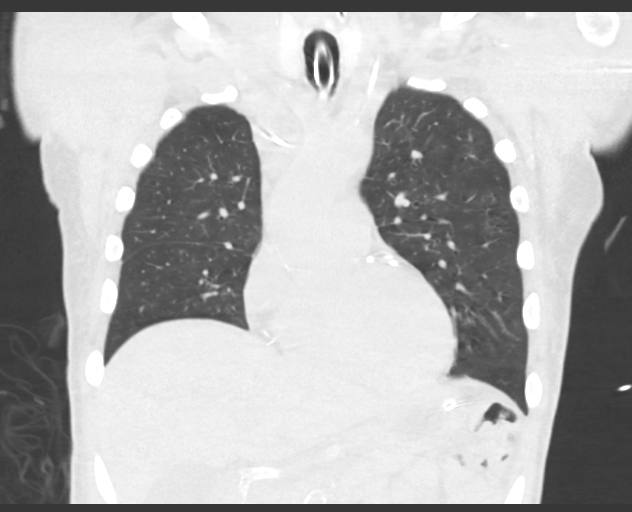
[im 65/109  lung]
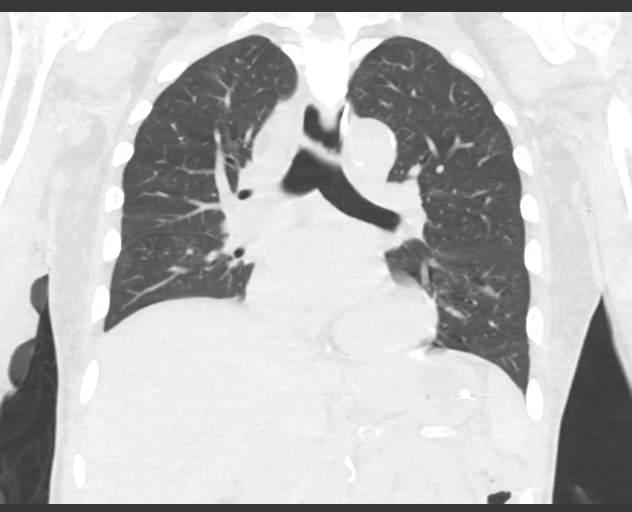

[15 of 36 positions shown; findings below may reference images not displayed]

FINDINGS: Cardiovascular: Atherosclerosis of thoracic aorta is noted without
aneurysm formation. Normal cardiac size. No pericardial effusion.
Coronary artery calcifications are noted. Left internal jugular
catheter is seen directed in retrograde manner into right subclavian
vein.

Mediastinum/Nodes: Thyroid gland is unremarkable. Endotracheal tube
is in grossly good position. Nasogastric tube is seen passing
through esophagus and into stomach. There does appear to be right
paratracheal and precarinal adenopathy, although evaluation is
limited due the lack of intravenous contrast. Largest right
paratracheal lymph node measures 16 mm. Precarinal lymph node
measures 2 cm.

Lungs/Pleura: No pneumothorax is noted. Minimal left posterior
basilar subsegmental atelectasis is noted. Large right lower lobe
airspace opacity is noted with air bronchograms most consistent with
pneumonia.

Upper Abdomen: No acute abnormality.

Musculoskeletal: No chest wall mass or suspicious bone lesions
identified.
IMPRESSION: Mildly enlarged right paratracheal and precarinal adenopathy is
noted with largest lymph node measuring 2 cm in precarinal region.
This may be infectious or inflammatory in etiology, but malignancy
or metastatic disease cannot be excluded. Evaluation is limited due
to lack of intravenous contrast.

Large right lower lobe airspace opacity is noted with air
bronchograms concerning for pneumonia, potentially aspiration
pneumonia.

Coronary artery calcifications are noted consistent with coronary
artery disease.

Aortic Atherosclerosis ([MU]-[MU]).

## 2020-07-14 MED ORDER — ORAL CARE MOUTH RINSE
15.0000 mL | OROMUCOSAL | Status: DC
  Administered 2020-07-14 – 2020-07-16 (×26): 15 mL via OROMUCOSAL

## 2020-07-14 MED ORDER — INSULIN ASPART 100 UNIT/ML IJ SOLN: 2.0000 [IU] | Freq: Three times a day (TID) | INTRAMUSCULAR | Status: DC

## 2020-07-14 MED ORDER — INSULIN ASPART 100 UNIT/ML IJ SOLN: 0.0000 [IU] | Freq: Three times a day (TID) | INTRAMUSCULAR | Status: DC

## 2020-07-14 MED ORDER — PROSOURCE TF PO LIQD
45.0000 mL | Freq: Two times a day (BID) | ORAL | Status: DC
  Administered 2020-07-14: 45 mL
  Filled 2020-07-14: qty 45

## 2020-07-14 MED ORDER — VITAL 1.5 CAL PO LIQD
1000.0000 mL | ORAL | Status: DC
  Administered 2020-07-14 – 2020-07-16 (×2): 1000 mL
  Filled 2020-07-14 (×2): qty 1000

## 2020-07-14 MED ORDER — POLYETHYLENE GLYCOL 3350 17 G PO PACK
17.0000 g | PACK | Freq: Every day | ORAL | Status: DC
  Administered 2020-07-14 – 2020-07-20 (×4): 17 g
  Filled 2020-07-14 (×4): qty 1

## 2020-07-14 MED ORDER — ATORVASTATIN CALCIUM 40 MG PO TABS
40.0000 mg | ORAL_TABLET | Freq: Every day | ORAL | Status: DC
  Administered 2020-07-14 – 2020-07-25 (×12): 40 mg
  Filled 2020-07-14 (×12): qty 1

## 2020-07-14 MED ORDER — INSULIN ASPART 100 UNIT/ML IJ SOLN
2.0000 [IU] | INTRAMUSCULAR | Status: DC
  Administered 2020-07-14 – 2020-07-19 (×22): 2 [IU] via SUBCUTANEOUS

## 2020-07-14 MED ORDER — LORAZEPAM 2 MG/ML IJ SOLN: 2.0000 mg | Freq: Once | INTRAMUSCULAR | Status: DC

## 2020-07-14 MED ORDER — ASPIRIN EC 81 MG PO TBEC
81.0000 mg | DELAYED_RELEASE_TABLET | Freq: Every day | ORAL | Status: DC
  Administered 2020-07-14: 81 mg via ORAL
  Filled 2020-07-14: qty 1

## 2020-07-14 MED ORDER — DEXTROSE 50 % IV SOLN: 0.0000 mL | INTRAVENOUS | Status: DC | PRN

## 2020-07-14 MED ORDER — FENTANYL 2500MCG IN NS 250ML (10MCG/ML) PREMIX INFUSION
50.0000 ug/h | INTRAVENOUS | Status: DC
  Administered 2020-07-14 – 2020-07-15 (×2): 100 ug/h via INTRAVENOUS
  Filled 2020-07-14 (×2): qty 250

## 2020-07-14 MED ORDER — MUPIROCIN 2 % EX OINT
1.0000 "application " | TOPICAL_OINTMENT | Freq: Two times a day (BID) | CUTANEOUS | Status: AC
  Administered 2020-07-14 – 2020-07-18 (×10): 1 via NASAL
  Filled 2020-07-14 (×2): qty 22

## 2020-07-14 MED ORDER — ETOMIDATE 2 MG/ML IV SOLN
20.0000 mg | Freq: Once | INTRAVENOUS | Status: AC
  Administered 2020-07-14: 20 mg via INTRAVENOUS

## 2020-07-14 MED ORDER — VITAL HIGH PROTEIN PO LIQD
1000.0000 mL | ORAL | Status: DC
  Administered 2020-07-14: 1000 mL

## 2020-07-14 MED ORDER — HEPARIN SODIUM (PORCINE) 5000 UNIT/ML IJ SOLN
5000.0000 [IU] | Freq: Three times a day (TID) | INTRAMUSCULAR | Status: DC
  Administered 2020-07-14 – 2020-07-29 (×44): 5000 [IU] via SUBCUTANEOUS
  Filled 2020-07-14 (×46): qty 1

## 2020-07-14 MED ORDER — INSULIN ASPART 100 UNIT/ML IJ SOLN
0.0000 [IU] | INTRAMUSCULAR | Status: DC
  Administered 2020-07-14: 2 [IU] via SUBCUTANEOUS

## 2020-07-14 MED ORDER — SODIUM CHLORIDE 0.9 % IV SOLN
INTRAVENOUS | Status: DC | PRN
  Administered 2020-07-14 – 2020-07-18 (×2): 250 mL via INTRAVENOUS
  Administered 2020-07-23: 1000 mL via INTRAVENOUS

## 2020-07-14 MED ORDER — DEXTROSE 50 % IV SOLN
INTRAVENOUS | Status: AC
  Administered 2020-07-14: 12.5 g via INTRAVENOUS
  Filled 2020-07-14: qty 50

## 2020-07-14 MED ORDER — DEXMEDETOMIDINE HCL IN NACL 400 MCG/100ML IV SOLN
0.0000 ug/kg/h | INTRAVENOUS | Status: DC
  Administered 2020-07-15: 0.8 ug/kg/h via INTRAVENOUS
  Administered 2020-07-15: 0.5 ug/kg/h via INTRAVENOUS
  Administered 2020-07-15: 0.6 ug/kg/h via INTRAVENOUS
  Administered 2020-07-16 – 2020-07-17 (×6): 1.2 ug/kg/h via INTRAVENOUS
  Filled 2020-07-14 (×10): qty 100

## 2020-07-14 MED ORDER — VANCOMYCIN VARIABLE DOSE PER UNSTABLE RENAL FUNCTION (PHARMACIST DOSING): Status: DC

## 2020-07-14 MED ORDER — FENTANYL CITRATE (PF) 100 MCG/2ML IJ SOLN
INTRAMUSCULAR | Status: AC
  Filled 2020-07-14: qty 2

## 2020-07-14 MED ORDER — DOCUSATE SODIUM 50 MG/5ML PO LIQD
100.0000 mg | Freq: Two times a day (BID) | ORAL | Status: DC
  Administered 2020-07-14 – 2020-07-24 (×9): 100 mg
  Filled 2020-07-14 (×12): qty 10

## 2020-07-14 MED ORDER — INSULIN ASPART 100 UNIT/ML IJ SOLN
0.0000 [IU] | INTRAMUSCULAR | Status: DC
  Administered 2020-07-14 – 2020-07-15 (×3): 4 [IU] via SUBCUTANEOUS

## 2020-07-14 MED ORDER — DEXMEDETOMIDINE HCL IN NACL 400 MCG/100ML IV SOLN
0.4000 ug/kg/h | INTRAVENOUS | Status: DC
  Administered 2020-07-14: 1.2 ug/kg/h via INTRAVENOUS
  Filled 2020-07-14: qty 100

## 2020-07-14 MED ORDER — DEXTROSE 50 % IV SOLN: 12.5000 g | INTRAVENOUS | Status: AC

## 2020-07-14 MED ORDER — "THROMBI-PAD 3""X3"" EX PADS"
1.0000 | MEDICATED_PAD | Freq: Once | CUTANEOUS | Status: AC
  Administered 2020-07-14: 1 via TOPICAL
  Filled 2020-07-14: qty 1

## 2020-07-14 MED ORDER — SODIUM CHLORIDE 0.9 % IV SOLN
3.0000 g | Freq: Two times a day (BID) | INTRAVENOUS | Status: DC
  Administered 2020-07-14: 3 g via INTRAVENOUS
  Filled 2020-07-14: qty 8

## 2020-07-14 MED ORDER — CALCIUM GLUCONATE-NACL 2-0.675 GM/100ML-% IV SOLN
2.0000 g | Freq: Once | INTRAVENOUS | Status: AC
  Administered 2020-07-14: 2000 mg via INTRAVENOUS
  Filled 2020-07-14: qty 100

## 2020-07-14 MED ORDER — ETOMIDATE 2 MG/ML IV SOLN
INTRAVENOUS | Status: AC
  Filled 2020-07-14: qty 20

## 2020-07-14 MED ORDER — PROSOURCE TF PO LIQD
45.0000 mL | Freq: Three times a day (TID) | ORAL | Status: DC
  Administered 2020-07-14 – 2020-07-24 (×32): 45 mL
  Filled 2020-07-14 (×33): qty 45

## 2020-07-14 MED ORDER — NOREPINEPHRINE 4 MG/250ML-% IV SOLN
0.0000 ug/min | INTRAVENOUS | Status: DC
  Administered 2020-07-14: 10 ug/min via INTRAVENOUS
  Filled 2020-07-14 (×2): qty 250

## 2020-07-14 MED ORDER — MIDAZOLAM HCL 2 MG/2ML IJ SOLN
INTRAMUSCULAR | Status: AC
  Filled 2020-07-14: qty 2

## 2020-07-14 MED ORDER — FENTANYL CITRATE (PF) 100 MCG/2ML IJ SOLN: 50.0000 ug | Freq: Once | INTRAMUSCULAR | Status: DC

## 2020-07-14 MED ORDER — LORAZEPAM 2 MG/ML IJ SOLN
INTRAMUSCULAR | Status: AC
  Administered 2020-07-14: 2 mg
  Filled 2020-07-14: qty 1

## 2020-07-14 MED ORDER — FENTANYL BOLUS VIA INFUSION
50.0000 ug | INTRAVENOUS | Status: DC | PRN
  Administered 2020-07-14: 100 ug via INTRAVENOUS
  Administered 2020-07-14: 50 ug via INTRAVENOUS
  Administered 2020-07-14: 100 ug via INTRAVENOUS
  Administered 2020-07-14: 50 ug via INTRAVENOUS
  Administered 2020-07-14 (×2): 100 ug via INTRAVENOUS
  Administered 2020-07-15 (×2): 50 ug via INTRAVENOUS
  Filled 2020-07-14: qty 100

## 2020-07-14 MED ORDER — INSULIN REGULAR(HUMAN) IN NACL 100-0.9 UT/100ML-% IV SOLN: INTRAVENOUS | Status: DC

## 2020-07-14 MED ORDER — DEXTROSE 5 % IV SOLN
350.0000 mg | Freq: Once | INTRAVENOUS | Status: AC
  Administered 2020-07-14: 350 mg via INTRAVENOUS
  Filled 2020-07-14: qty 7

## 2020-07-14 MED ORDER — PANTOPRAZOLE SODIUM 40 MG PO PACK
40.0000 mg | PACK | Freq: Every day | ORAL | Status: DC
  Administered 2020-07-14 – 2020-07-17 (×4): 40 mg
  Filled 2020-07-14 (×4): qty 20

## 2020-07-14 MED ORDER — VANCOMYCIN HCL 1500 MG/300ML IV SOLN
1500.0000 mg | Freq: Once | INTRAVENOUS | Status: AC
  Administered 2020-07-14: 1500 mg via INTRAVENOUS
  Filled 2020-07-14: qty 300

## 2020-07-14 MED ORDER — CHLORHEXIDINE GLUCONATE 0.12% ORAL RINSE (MEDLINE KIT)
15.0000 mL | Freq: Two times a day (BID) | OROMUCOSAL | Status: DC
  Administered 2020-07-14 – 2020-07-16 (×6): 15 mL via OROMUCOSAL

## 2020-07-14 MED ORDER — SODIUM CHLORIDE 0.9 % IV SOLN
2.0000 g | Freq: Two times a day (BID) | INTRAVENOUS | Status: DC
  Administered 2020-07-14 – 2020-07-17 (×6): 2 g via INTRAVENOUS
  Filled 2020-07-14 (×6): qty 20

## 2020-07-14 MED ORDER — ASPIRIN 81 MG PO CHEW
81.0000 mg | CHEWABLE_TABLET | Freq: Every day | ORAL | Status: DC
  Administered 2020-07-15 – 2020-07-25 (×11): 81 mg
  Filled 2020-07-14 (×11): qty 1

## 2020-07-14 MED ORDER — DEXTROSE 5 % IV SOLN
350.0000 mg | INTRAVENOUS | Status: DC
  Administered 2020-07-16 (×2): 350 mg via INTRAVENOUS
  Filled 2020-07-14 (×3): qty 7

## 2020-07-14 MED ORDER — INSULIN ASPART 100 UNIT/ML IJ SOLN
0.0000 [IU] | INTRAMUSCULAR | Status: DC
  Administered 2020-07-14: 4 [IU] via SUBCUTANEOUS

## 2020-07-14 MED ORDER — PROPOFOL 1000 MG/100ML IV EMUL
0.0000 ug/kg/min | INTRAVENOUS | Status: DC
  Administered 2020-07-14: 20 ug/kg/min via INTRAVENOUS
  Administered 2020-07-14: 30 ug/kg/min via INTRAVENOUS
  Administered 2020-07-15: 10 ug/kg/min via INTRAVENOUS
  Filled 2020-07-14 (×3): qty 100

## 2020-07-14 MED ORDER — ROCURONIUM BROMIDE 50 MG/5ML IV SOLN
100.0000 mg | Freq: Once | INTRAVENOUS | Status: AC
  Administered 2020-07-14: 100 mg via INTRAVENOUS
  Filled 2020-07-14: qty 10

## 2020-07-14 MED ORDER — ROCURONIUM BROMIDE 10 MG/ML (PF) SYRINGE
PREFILLED_SYRINGE | INTRAVENOUS | Status: AC
  Filled 2020-07-14: qty 10

## 2020-07-14 NOTE — Progress Notes (Addendum)
[facility] Progress Note Patient Name: Jorge [name] DOB: 27-Jul-1970 MRN: [id number]   Date of Service  07/14/2020  HPI/Events of Note  Patient admitted with uncontrolled hypertension, altered mental status, hyperkalemia, hyponatremia and ESRD, he is also markedly hyperglycemic.  eICU Interventions  New Patient Evaluation completed,  Insulin regimen adjusted. Dialysis about to be started.        Kerry Kass Joene Gelder 07/14/2020, 1:59 AM

## 2020-07-14 NOTE — Progress Notes (Signed)
Hypoglycemic Event  CBG: 61  Treatment: D50 25 mL (12.5 gm)  Symptoms: None  Follow-up CBG: Time: 2020 CBG Result:79  Possible Reasons for Event: Inadequate meal intake and Medication regimen: Tube Feed coverage given '@1600'$  blood sugar but tube feed not yet at goal rate.

## 2020-07-14 NOTE — Procedures (Signed)
Central Venous Catheter Insertion Procedure Note  Jorge [name]  [id number]  [birth year]  Date:07/14/20  Time:9:57 AM   Provider Performing:Shaarav Ripple   Procedure: Insertion of Non-tunneled Central Venous [phone]) with US guidance [id number])   Indication(s) Medication administration  Consent Risks of the procedure as well as the alternatives and risks of each were explained to the patient and/or caregiver.  Consent for the procedure was obtained and is signed in the bedside chart  Anesthesia Topical only with 1% lidocaine   Timeout Verified patient identification, verified procedure, site/side was marked, verified correct patient position, special equipment/implants available, medications/allergies/relevant history reviewed, required imaging and test results available.  Sterile Technique Maximal sterile technique including full sterile barrier drape, hand hygiene, sterile gown, sterile gloves, mask, hair covering, sterile ultrasound probe cover (if used).  Procedure Description Area of catheter insertion was cleaned with chlorhexidine and draped in sterile fashion.  With real-time ultrasound guidance a central venous catheter was placed into the left internal jugular vein. Nonpulsatile blood flow and easy flushing noted in all ports.  The catheter was sutured in place and sterile dressing applied.  Complications/Tolerance None; patient tolerated the procedure well. Chest X-ray is ordered to verify placement for internal jugular or subclavian cannulation.   Chest x-ray is not ordered for femoral cannulation.  EBL Minimal  Specimen(s) None

## 2020-07-14 NOTE — Consult Note (Addendum)
NAME:  Jorge [name], MRN:  [id number], DOB:  [birth year], LOS: 1 ADMISSION DATE:  07/13/2020, CONSULTATION DATE:  07/13/20 REFERRING MD:  Rogers Blocker CHIEF COMPLAINT:  AMS, HTN   History of Present Illness:  Jorge [name] is a 50 y.o. male who has a PMH including but not limited to ESRD on HD MWF, bilateral BKA, DM, ICH, TIA, HTN, HLD.  He was admitted 07/13/2020 with AMS and volume overload.  He had apparently been at his regular dialysis session and had gotten roughly 1 hour of treatment before he began to pull at lines and become confused.  He was taken off dialysis and was sent to the ED.  In the ED, he was confused and blood pressure was initially normal; however, after several hours, he became hypoxic, progressively more hypotensive into the [id number] systolic, and had worsening AMS. He was admitted by [facility] with plans for dialysis overnight.  He received IV hydralazine and labetalol with minimal improvement in blood pressure.  PCCM subsequently called as it was felt that he would need continuous infusion.  He also had agitation and was not fully cooperative with nursing staff and required additional dose of Haldol. He has been seen by nephrology who plans for emergent dialysis tonight.  Pertinent  Medical History:  has ESRD (end stage renal disease) on dialysis ([place]); Hypertension associated with diabetes ([place]); Type 2 diabetes mellitus with chronic kidney disease on chronic dialysis, with long-term current use of insulin ([place]); ESRD on peritoneal dialysis ([place]); S/P BKA (below knee amputation) bilateral ([place]); Labile blood glucose; Anemia of chronic disease; Gastroparesis due to DM ([place]); Aluminum bone disease; Encounter for adequacy testing for peritoneal dialysis [facility]); Other disorders of bilirubin metabolism; Other disorders resulting from impaired renal tubular function; Coagulation defect, unspecified ([place]); Personal history of anaphylaxis; Secondary hyperparathyroidism of renal origin ([place]); Acute  encephalopathy; TIA (transient ischemic attack); Stupor; Hyperlipidemia associated with type 2 diabetes mellitus ([place]); AMS (altered mental status); and Hypertensive urgency on their problem list.  Significant Hospital Events: Including procedures, antibiotic start and stop dates in addition to other pertinent events   7/6 admit.  Interim History / Subjective:  Confused, agitated, trying to sit up. Given '2mg'$  ativan and fell asleep. Labs could not be drawn due to agitation  Objective:  Blood pressure (!) 112/59, pulse 95, temperature 98.4 F (36.9 C), temperature source Axillary, resp. rate (!) 22, height '5\' 11"'$  (1.803 m), weight 68.6 kg, SpO2 100 %.        Intake/Output Summary (Last 24 hours) at 07/14/2020 [id number] Last data filed at 07/14/2020 0600 Gross per 24 hour  Intake 13.63 ml  Output 2580 ml  Net -2566.37 ml   Filed Weights   07/14/20 0030  Weight: 68.6 kg    Examination: Constitutional: confused man yelling trying to get out of bed  Eyes: pupils equal, tracking intermittently Ears, nose, mouth, and throat: trachea midline, MMM Cardiovascular: RRR, pronounced P2, ext warm Respiratory: clear, no wheezing Gastrointestinal: soft, hypoactive BS Skin: No rashes, normal turgor Ext: bilateral BKA Neurologic: was nonfocal just very agitated Psychiatric: RASS +1   Labs/imaging personally reviewed:  CT head 7/6  > no acute process.  Resolved Hospital Problem list:    Assessment & Plan:  Persistent severe encephalopathy, hx multiple CVA, abnormal CXR with question of mediastinal mass Severe hyperkalemia post emergent HD Hypertensive crisis improved now actually borderline hypertensive Hyperglycemia improving, question HHS vs. DKA  - Intubate, sedate for imaging and airway protection - VAP prevention bundle -  Stat MRI, CT chest - EEG after MRI - Maintain MAP 65-80 - HD per nephrology - Insulin SSI, check repeat BMP to make sure gap closed - Updated sister and  daughter over phone - Aspirin/statin  Best practice (evaluated daily):  Diet/type: TF DVT prophylaxis: heparin GI prophylaxis: ppi Lines: yes Foley:  condom (makes small amount of urine) Code Status:  full code Last date of multidisciplinary goals of care discussion: continue aggressive care, 7/7  45 minutes cc time independent of separately billed procedures

## 2020-07-14 NOTE — Progress Notes (Signed)
Pt is unavailable for EEG at this time due to Pt being at another procedure now. Will attempt later when schedule permits

## 2020-07-14 NOTE — Progress Notes (Signed)
Pt transported to MRI and CT on the ventilator without incident.

## 2020-07-14 NOTE — Progress Notes (Signed)
EEG complete - results pending 

## 2020-07-14 NOTE — Progress Notes (Signed)
Initial Nutrition Assessment  DOCUMENTATION CODES:   Non-severe (moderate) malnutrition in context of chronic illness  INTERVENTION:   Initiate tube feeds via NG tube: - Start Vital 1.5 @ 20 ml/hr and advance by 10 ml q 8 hours to goal rate of 50 ml/hr (1200 ml/day) - ProSource TF 45 ml TID  Tube feeding regimen at goal provides 1920 kcal, 114 grams of protein, and 917 ml of H2O.  Tube feeding regimen at goal and current propofol provides 2245 total kcal (>100% of needs).  - Can consider exchanging NG tube for Cortrak tube tomorrow (7/08) if desired  NUTRITION DIAGNOSIS:   Moderate Malnutrition related to chronic illness (ERSD) as evidenced by moderate fat depletion, moderate muscle depletion.  GOAL:   Patient will meet greater than or equal to 90% of their needs  MONITOR:   Vent status, Labs, Weight trends, TF tolerance, I & O's  REASON FOR ASSESSMENT:   Ventilator, Consult Enteral/tube feeding initiation and management  ASSESSMENT:   50 year old male who presented to the ED on 7/06 with AMS. PMH of ESRD on HD, T2DM, gastroparesis, remote CVA, HTN, bilateral BKAs, ICH, TIA, HLD. Pt admitted with hypertensive urgency, hyperkalemia, hypoxia.  7/07 - intubated, NG tube placed  Discussed pt with RN and during ICU rounds. Last HD was overnight with 2570 ml net UF.  Consult received for tube feeding initiation and management. Pt with NG tube in distal stomach. Per RN, unable to place OG tube. Can exchange NG tube for Cortrak tomorrow if desired. Pt with a history of gastroparesis; will start tube feeds at trickle rate and slowly advance to goal. RN aware of plan.  No family present at time of RD visit so unable to obtain diet and weight history at this time. Reviewed available weight history in chart. Pt's weight has fluctuated between 63-78 kg over the last year with no real trends noted. Suspect weight fluctuations are related to fluid status given ESRD on HD. Current weight  is consistent with EDW of 68 kg.  Patient is currently intubated on ventilator support MV: 8.7 L/min Temp (24hrs), Avg:99.8 F (37.7 C), Min:97.4 F (36.3 C), Max:101.5 F (38.6 C)  Drips: Propofol: 12.3 ml/hr (provides 325 kcal daily from lipid) Fentanyl Levophed  Medications reviewed and include: colace, SSI q 4 hours, protonix, miralax, IV calcium gluconate 2 grams once  Labs reviewed: sodium 134, phosphorus 5.1, hemoglobin A1C 6.7 CBG's: 136-345 x 24 hours  UOP: 10 ml x 12 hours I/O's: -2.3 L since admit  NUTRITION - FOCUSED PHYSICAL EXAM:  Flowsheet Row Most Recent Value  Orbital Region Mild depletion  Upper Arm Region Moderate depletion  Thoracic and Lumbar Region Moderate depletion  Buccal Region Unable to assess  Temple Region Moderate depletion  Clavicle Bone Region Moderate depletion  Clavicle and Acromion Bone Region Moderate depletion  Scapular Bone Region Unable to assess  Dorsal Hand Mild depletion  Patellar Region Unable to assess  Anterior Thigh Region Moderate depletion  Posterior Calf Region Unable to assess  Edema (RD Assessment) None  Hair Reviewed  Eyes Unable to assess  Mouth Unable to assess  Skin Reviewed  Nails Reviewed       Diet Order:   Diet Order             Diet NPO time specified  Diet effective now                   EDUCATION NEEDS:   No education needs have been identified  at this time  Skin:  Skin Assessment: Reviewed RN Assessment  Last BM:  no documented BM  Height:   Ht Readings from Last 1 Encounters:  07/14/20 '5\' 11"'$  (1.803 m)    Weight:   Wt Readings from Last 1 Encounters:  07/14/20 68.6 kg    Ideal Body Weight:  68 kg (adjusted for bilateral BKA)  BMI:  Body mass index is 21.09 kg/m.  Adjusted BMI: 24.26 kg/m  Estimated Nutritional Needs:   Kcal:  1900-2100  Protein:  105-120 grams  Fluid:  UOP + 1000 ml    Gustavus Bryant, MS, RD, LDN Inpatient Clinical Dietitian Please see AMiON  for contact information.

## 2020-07-14 NOTE — ED Notes (Signed)
Pt found to be febrile - no PRNs ordered - MD notified - RNs notified upon pts arrival to [facility]

## 2020-07-14 NOTE — Procedures (Signed)
Lumbar Puncture Procedure Note  Jorge [name]  [id number]  [birth year]  Date:07/14/20  Time:5:29 PM   Provider Performing:Petrice Beedy   Procedure: Lumbar Puncture [id number])  Indication(s) Rule out meningitis  Consent Risks of the procedure as well as the alternatives and risks of each were explained to the patient and/or caregiver.  Consent for the procedure was obtained and is signed in the bedside chart  Anesthesia Topical only with 1% lidocaine    Time Out Verified patient identification, verified procedure, site/side was marked, verified correct patient position, special equipment/implants available, medications/allergies/relevant history reviewed, required imaging and test results available.   Sterile Technique Maximal sterile technique including sterile barrier drape, hand hygiene, sterile gown, sterile gloves, mask, hair covering.    Procedure Description Using palpation, approximate location of L3-L4 space identified.   Lidocaine used to anesthetize skin and subcutaneous tissue overlying this area.  A 20g spinal needle was then used to access the subarachnoid space. Opening pressure: 16cm H2O. Closing pressure:Not obtained. 13 cc cloudy CSF obtained.  Complications/Tolerance None; patient tolerated the procedure well.   EBL Minimal   Specimen(s) CSF

## 2020-07-14 NOTE — Progress Notes (Signed)
PHARMACY - PHYSICIAN COMMUNICATION CRITICAL VALUE ALERT - BLOOD CULTURE IDENTIFICATION (BCID)  Jorge [name] is an 50 y.o. male who presented to [facility] on 07/13/2020 with a chief complaint of AMS.  Assessment:  16 ESRD with AMS and now with concern for meningitis due to cloudy LP tap and starting broad antibiotics. 1 of 4 blood cultures growing GPC with BCID detecting staph species - probable contamination.   Name of physician (or Provider) Contacted: Erskine Emery (CCM)  Current antibiotics: None - orders to start Vancomycin + Rocephin + Acyclovir for meningitis coverage.   Changes to prescribed antibiotics recommended:  No changes - to start broad coverage for r/o meningitis while awaiting LP culture results  Results for orders placed or performed during the hospital encounter of 07/13/20  Blood Culture ID Panel (Reflexed) (Collected: 07/13/2020  8:16 PM)  Result Value Ref Range   Enterococcus faecalis NOT DETECTED NOT DETECTED   Enterococcus Faecium NOT DETECTED NOT DETECTED   Listeria monocytogenes NOT DETECTED NOT DETECTED   Staphylococcus species DETECTED (A) NOT DETECTED   Staphylococcus aureus (BCID) NOT DETECTED NOT DETECTED   Staphylococcus epidermidis NOT DETECTED NOT DETECTED   Staphylococcus lugdunensis NOT DETECTED NOT DETECTED   Streptococcus species NOT DETECTED NOT DETECTED   Streptococcus agalactiae NOT DETECTED NOT DETECTED   Streptococcus pneumoniae NOT DETECTED NOT DETECTED   Streptococcus pyogenes NOT DETECTED NOT DETECTED   A.calcoaceticus-baumannii NOT DETECTED NOT DETECTED   Bacteroides fragilis NOT DETECTED NOT DETECTED   Enterobacterales NOT DETECTED NOT DETECTED   Enterobacter cloacae complex NOT DETECTED NOT DETECTED   Escherichia coli NOT DETECTED NOT DETECTED   Klebsiella aerogenes NOT DETECTED NOT DETECTED   Klebsiella oxytoca NOT DETECTED NOT DETECTED   Klebsiella pneumoniae NOT DETECTED NOT DETECTED   Proteus species NOT DETECTED NOT DETECTED    Salmonella species NOT DETECTED NOT DETECTED   Serratia marcescens NOT DETECTED NOT DETECTED   Haemophilus influenzae NOT DETECTED NOT DETECTED   Neisseria meningitidis NOT DETECTED NOT DETECTED   Pseudomonas aeruginosa NOT DETECTED NOT DETECTED   Stenotrophomonas maltophilia NOT DETECTED NOT DETECTED   Candida albicans NOT DETECTED NOT DETECTED   Candida auris NOT DETECTED NOT DETECTED   Candida glabrata NOT DETECTED NOT DETECTED   Candida krusei NOT DETECTED NOT DETECTED   Candida parapsilosis NOT DETECTED NOT DETECTED   Candida tropicalis NOT DETECTED NOT DETECTED   Cryptococcus neoformans/gattii NOT DETECTED NOT DETECTED    Thank you for allowing pharmacy to be a part of this patient's care.  Alycia Rossetti, PharmD, BCPS Clinical Pharmacist Clinical phone for 07/14/2020: [phone] 07/14/2020 5:49 PM   **Pharmacist phone directory can now be found on [email or web address] ([email or web address]).  Listed under [place].

## 2020-07-14 NOTE — Progress Notes (Signed)
[place] Progress Note Patient Name: Jorge [name] DOB: 03/10/1970 MRN: [id number]   Date of Service  07/14/2020  HPI/Events of Note  RN reports LP results: - Tube #1: WBC 16, RBC 1760, glucose 117, protein 41. - Tube #2: WBC 19, RBC 595 - Both tubes with neutrophilic predominance ([id number]).  Also reports troponin 51 -> 83.   eICU Interventions  More likely compatible with viral meningitis/encephalitis than bacterial, but will continue broad-spectrum coverage pending culture/HSV PCR results: vanc/CTX/acyclovir IV.     Intervention Category Minor Interventions: Clinical assessment - ordering diagnostic tests  Charlott Rakes 07/14/2020, 8:55 PM

## 2020-07-14 NOTE — Progress Notes (Signed)
Pharmacy Antibiotic Note  Jorge [name] is a 50 y.o. male admitted on 07/13/2020 with AMS and now with concern for meningitis with cloudy LP tap. Pharmacy has been consulted for Vancomycin + Acyclovir dosing along with Rocephin per MD.   ESRD - usual schedule MWF - last HD off schedule and early 7/7 AM (3.5h BFR 350-400).   Unasyn 3g IV x 1 given earlier today at [place]: - Vancomycin 1500 mg IV x 1 to load - No standing Vancomycin - f/u HD schedule vs plans to transition to CRRT while on pressors - Adjust Rocephin to 2g IV every 12 hours - Start Acyclovir 350 mg (~5 mg/kg) every 24 hours - Will continue to follow plans for HD vs CRRT, culture results, LOT, and antibiotic de-escalation plans   Height: '5\' 11"'$  (180.3 cm) Weight:  (BED scale broken) IBW/kg (Calculated) : 75.3  Temp (24hrs), Avg:99.7 F (37.6 C), Min:97.4 F (36.3 C), Max:101.5 F (38.6 C)  Recent Labs  Lab 07/13/20 1549 07/13/20 1946 07/13/20 2148 07/14/20 0058 07/14/20 0400 07/14/20 1008  WBC 6.9  --  8.6  --  11.0* 9.2  CREATININE 8.15* 8.62* 8.55* 8.95*  --  5.31*  LATICACIDVEN  --   --   --   --   --  1.1    Estimated Creatinine Clearance: 15.8 mL/min (A) (by C-G formula based on SCr of 5.31 mg/dL (H)).    Allergies  Allergen Reactions   Lactose Intolerance (Gi) Diarrhea and Nausea Only    Antimicrobials this admission: Unasyn 7/7 x 1 Vanc 7/7 >> Rocephin 7/7 >> Acyclovir 7/7 >>  Dose adjustments this admission: N/a  Microbiology results: 7/6 Flu/COVID >> neg 7/6 MRSA PCR >> positive 7/6 BCx >> 1 of 4 staph species 7/7 CSF cx >>  Thank you for allowing pharmacy to be a part of this patient's care.  Alycia Rossetti, PharmD, BCPS Clinical Pharmacist Clinical phone for 07/14/2020: [phone] 07/14/2020 6:03 PM   **Pharmacist phone directory can now be found on [email or web address] ([email or web address]).  Listed under [place].

## 2020-07-14 NOTE — Procedures (Addendum)
Central Venous Catheter Insertion Procedure Note  JOHANY KARGES  [id number]  1970-06-30  Date:07/14/20  Time:3:30 PM   Provider Performing:Denaisha Swango D Rollene Rotunda   Procedure: Insertion of Non-tunneled Central Venous [phone]) with US guidance [id number])   Indication(s) Medication administration  Consent Risks of the procedure as well as the alternatives and risks of each were explained to the patient and/or caregiver.  Consent for the procedure was obtained and is signed in the bedside chart  Anesthesia Topical only with 1% lidocaine   Timeout Verified patient identification, verified procedure, site/side was marked, verified correct patient position, special equipment/implants available, medications/allergies/relevant history reviewed, required imaging and test results available.  Sterile Technique Maximal sterile technique including full sterile barrier drape, hand hygiene, sterile gown, sterile gloves, mask, hair covering, sterile ultrasound probe cover (if used).  Procedure Description Area of catheter insertion was cleaned with chlorhexidine and draped in sterile fashion.  With real-time ultrasound guidance a central venous catheter was placed into the right femoral vein. Nonpulsatile blood flow and easy flushing noted in all ports.  The catheter was sutured in place and sterile dressing applied.    Complications/Tolerance None; patient tolerated the procedure well. Chest X-ray is ordered to verify placement for internal jugular or subclavian cannulation.   Chest x-ray is not ordered for femoral cannulation.  EBL Minimal  Specimen(s) None  JD Rexene Agent [facility] 07/14/2020, 3:31 PM  Please see [email or web address] for pager details.  From 7A-7P if no response, please call [phone]. After hours, please call ELink [phone].

## 2020-07-14 NOTE — Procedures (Signed)
Intubation Procedure Note  Jorge [name]  [id number]  1970/02/10  Date:07/14/20  Time:9:56 AM   Provider Performing:Wallace Gappa    Procedure: Intubation (31500)  Indication(s) Respiratory Failure  Consent Risks of the procedure as well as the alternatives and risks of each were explained to the patient and/or caregiver.  Consent for the procedure was obtained and is signed in the bedside chart   Anesthesia Etomidate and Rocuronium   Time Out Verified patient identification, verified procedure, site/side was marked, verified correct patient position, special equipment/implants available, medications/allergies/relevant history reviewed, required imaging and test results available.   Sterile Technique Usual hand hygeine, masks, and gloves were used   Procedure Description Patient positioned in bed supine.  Sedation given as noted above.  Patient was intubated with endotracheal tube using  DL (MAC 40 .  View was Grade 1 full glottis .  Number of attempts was 1.  Colorimetric CO2 detector was consistent with tracheal placement.   Complications/Tolerance None; patient tolerated the procedure well. Chest X-ray is ordered to verify placement.   EBL Minimal   Specimen(s) None

## 2020-07-14 NOTE — Progress Notes (Signed)
[facility] Progress Note   Subjective:  Seen in ICU bed - currently intubated and just got OG tube, getting set up for central line placement. Was dialyzed overnight for hyperkalemia - got done around 5am per notes - will repeat RFP to assure hyperkalemia has improved.   Objective Vitals:   07/14/20 0730 07/14/20 0858 07/14/20 0923 07/14/20 0930  BP:    126/82  Pulse: 95   81  Resp: (!) 22  15 15   Temp:      TempSrc:      SpO2: 100%  100% 100%  Weight:      Height:  5' 11"  (1.803 m) 5' 11"  (1.803 m)    Physical Exam General: Intubated/sedated on vent, OG tube in place. Heart: RRR Lungs: CTA anteriorly  Abdomen: soft Extremities: B BKA, no stump edema Dialysis Access: RUE AVF + bruit  Additional Objective Labs: Basic Metabolic Panel: Recent Labs  Lab 07/13/20 1946 07/13/20 2148 07/14/20 0058  NA 133* 133* 134*  K 6.4* 6.7* 7.5*  CL 97* 97* 98  CO2 21* 17* 18*  GLUCOSE 306* 341* 327*  BUN 37* 39* 41*  CREATININE 8.62* 8.55* 8.95*  CALCIUM 9.5 9.3 9.5  PHOS 6.9* 7.0*  --    Liver Function Tests: Recent Labs  Lab 07/13/20 1549 07/13/20 2148  AST 20  --   ALT 15  --   ALKPHOS 88  --   BILITOT 1.0  --   PROT 7.7  --   ALBUMIN 3.8 3.8   CBC: Recent Labs  Lab 07/13/20 1549 07/13/20 1639 07/13/20 2148 07/14/20 0400  WBC 6.9  --  8.6 11.0*  NEUTROABS 4.1  --   --   --   HGB 11.9* 12.9* 12.0* 11.4*  HCT 38.5* 38.0* 38.6* 34.6*  MCV 96.7  --  96.0 91.8  PLT 80*  --  96* 99*   Blood Culture    Component Value Date/Time   SDES BLOOD BLOOD RIGHT FOREARM 07/13/2020 2016   SPECREQUEST  07/13/2020 2016    BOTTLES DRAWN AEROBIC AND ANAEROBIC Blood Culture results may not be optimal due to an inadequate volume of blood received in culture bottles   CULT  07/13/2020 2016    NO GROWTH < 12 HOURS Performed at [facility] [address], [place], [place] [address]    REPTSTATUS PENDING 07/13/2020 2016   CBG: Recent Labs  Lab  07/13/20 2319 07/14/20 0011 07/14/20 0208 07/14/20 0333 07/14/20 0723  GLUCAP 304* 345* 251* 145* 136*   Studies/Results: CT Head Wo Contrast  Result Date: 07/13/2020 CLINICAL DATA:  Delirium. EXAM: CT HEAD WITHOUT CONTRAST TECHNIQUE: Contiguous axial images were obtained from the base of the skull through the vertex without intravenous contrast. COMPARISON:  Head CT and brain MRI May 09, 2020 and May 10, 2020 FINDINGS: Non traditional scan plane limits detailed assessment. There is also streak and motion artifact. Skull vertex is excluded from the field of view. Brain: No evidence of hemorrhage allowing for limitations. Stable atrophy. Periventricular chronic small vessel ischemia. No hydrocephalus, midline shift, or mass effect. Remote cerebellar infarcts on prior MRI are not well defined on the current exam. No obvious acute ischemia, with evaluation limited by artifact. No subdural or extra-axial collection. Vascular: Atherosclerosis of skullbase vasculature without hyperdense vessel or abnormal calcification. Skull: No fracture or focal lesion. Sinuses/Orbits: Paranasal sinuses and mastoid air cells are clear. The visualized orbits are unremarkable. Other: None. IMPRESSION: 1. Technically limited exam as described.  2. Allowing for limitations, no acute intracranial abnormality. 3. Stable atrophy. Electronically Signed   By: Keith Rake M.D.   On: 07/13/2020 16:59   DG CHEST PORT 1 VIEW  Result Date: 07/14/2020 CLINICAL DATA:  Orogastric tube placement. EXAM: PORTABLE CHEST 1 VIEW COMPARISON:  Same day. FINDINGS: Stable cardiac size. Endotracheal tube is in grossly good position. Nasogastric tube tip is seen in expected position of distal stomach. Left internal jugular catheter is noted with tip directed into the right subclavian vein. No pneumothorax or pleural effusion is noted. IMPRESSION: Endotracheal and nasogastric tubes are in grossly good position. Left internal jugular catheter is noted  with tip directed into the right subclavian vein; repositioning is recommended. These results will be called to the ordering clinician or representative by the Radiologist Assistant, and communication documented in the PACS or zVision Dashboard. Electronically Signed   By: Marijo Conception M.D.   On: 07/14/2020 10:30   DG CHEST PORT 1 VIEW  Result Date: 07/14/2020 CLINICAL DATA:  Altered mental status. EXAM: PORTABLE CHEST 1 VIEW COMPARISON:  07/13/2020.  03/08/2020.  CT neck 10/04/2019. FINDINGS: Persistent right upper mediastinal soft tissue fullness. Neck CT of 10/04/2019 revealed mediastinal lymph nodes. Contrast-enhanced CT of the chest should be considered for further evaluation. Low lung volumes. Mild right base infiltrate cannot be excluded. No pleural effusion or pneumothorax. Heart size normal. No acute bony abnormality. IMPRESSION: 1. Persistent right upper mediastinal soft tissue fullness suggesting a mediastinal mass. Neck CT of 10/04/2019 revealed mediastinal lymph nodes. Contrast-enhanced CT of the chest should be considered for further evaluation. 2. Low lung volumes. Mild right base infiltrate cannot be excluded. Electronically Signed   By: Marcello Moores  Register   On: 07/14/2020 07:03   DG Chest Port 1 View  Result Date: 07/13/2020 CLINICAL DATA:  Sudden onset of altered mental status during hemodialysis today. EXAM: PORTABLE CHEST 1 VIEW COMPARISON:  Radiographs 03/08/2020 and 10/03/2019. Neck CTA 10/04/2019. FINDINGS: 1649 hours. Mild patient rotation to the right. There is stable cardiomegaly. There is mildly increased right paratracheal soft tissue prominence which may relate to the rotation. On the prior CTA, there were prominent lymph nodes and fat in the right paratracheal region. The lungs appear clear. There is no pleural effusion or pneumothorax. No acute osseous findings are evident. IMPRESSION: No definite acute cardiopulmonary process. Increased soft tissue fullness in the right  paratracheal region may relate to patient rotation. Attention on follow-up recommended; if persistent, follow-up CT warranted. Electronically Signed   By: Richardean Sale M.D.   On: 07/13/2020 17:24    Medications:  sodium chloride     sodium chloride     sodium chloride     sodium chloride 250 mL (07/14/20 1025)   calcium gluconate 2,000 mg (07/14/20 1030)   dexmedetomidine (PRECEDEX) IV infusion Stopped (07/14/20 0752)   fentaNYL infusion INTRAVENOUS 100 mcg/hr (07/14/20 1034)   niCARDipine Stopped (07/14/20 0103)   norepinephrine (LEVOPHED) Adult infusion Stopped (07/14/20 1054)   propofol (DIPRIVAN) infusion 20 mcg/kg/min (07/14/20 1032)    aspirin EC  81 mg Oral Daily   atorvastatin  40 mg Per Tube Daily   chlorhexidine gluconate (MEDLINE KIT)  15 mL Mouth Rinse BID   Chlorhexidine Gluconate Cloth  6 each Topical Q0600   docusate  100 mg Per Tube BID   etomidate       feeding supplement (PROSource TF)  45 mL Per Tube BID   feeding supplement (VITAL HIGH PROTEIN)  1,000 mL Per Tube Q24H  fentaNYL       fentaNYL (SUBLIMAZE) injection  50 mcg Intravenous Once   heparin injection (subcutaneous)  5,000 Units Subcutaneous Q8H   insulin aspart  0-6 Units Subcutaneous Q4H   LORazepam  2 mg Intravenous Once   mouth rinse  15 mL Mouth Rinse 10 times per day   midazolam       mupirocin ointment  1 application Nasal BID   pantoprazole sodium  40 mg Per Tube Daily   polyethylene glycol  17 g Per Tube Daily   rocuronium bromide       sodium chloride flush  3 mL Intravenous Q12H    Dialysis Orders: MWF @ [place] 4hr, 400/A1.5, EDW 68kg, 2K/2.5Ca, AVF, UFP #2, no heparin - Hecoral 4mg Iv q HD  Assessment/Plan: 1. Hypertensive Urgency: S/p IV med treatment followed by overnight dialysis - controlled now and appears euvolemic. 2. Hyperkalemia: S/p HD overnight - repeat K has been ordered/pending. 3. Hypoxia -> s/p intubation 7/7: Per CCM. 4. ESRD: Usual MWF schedule - s/p overnight HD,  next HD on 7/8 if stable. 5. Anemia: Hgb 11.4 - no ESA needs. 6. Secondary hyperparathyroidism: CorrCa high side, hold VDRA. Phos high - resume binders once eating. 7. T2DM 8. Hx ICH - head CT without acute abnormality 9. Severe agitation: Unclear etiology. Blood Cx 7/6 pending. 10. ?Mediastinal mass: Per CXR, CT recommended.  KVeneta Penton PA-C 07/14/2020, 10:56 AM  [employer] [employer]

## 2020-07-15 ENCOUNTER — Inpatient Hospital Stay (HOSPITAL_COMMUNITY): Payer: Medicare Other

## 2020-07-15 DIAGNOSIS — R401 Stupor: Secondary | ICD-10-CM | POA: Diagnosis not present

## 2020-07-15 DIAGNOSIS — I339 Acute and subacute endocarditis, unspecified: Secondary | ICD-10-CM

## 2020-07-15 DIAGNOSIS — G9340 Encephalopathy, unspecified: Secondary | ICD-10-CM | POA: Diagnosis not present

## 2020-07-15 LAB — BASIC METABOLIC PANEL
Anion gap: 14 (ref 5–15)
BUN: 32 mg/dL — ABNORMAL HIGH (ref 6–20)
CO2: 25 mmol/L (ref 22–32)
Calcium: 9.9 mg/dL (ref 8.9–10.3)
Chloride: 97 mmol/L — ABNORMAL LOW (ref 98–111)
Creatinine, Ser: 6.84 mg/dL — ABNORMAL HIGH (ref 0.61–1.24)
GFR, Estimated: 9 mL/min — ABNORMAL LOW (ref >60)
Glucose, Bld: 169 mg/dL — ABNORMAL HIGH (ref 70–99)
Potassium: 4.3 mmol/L (ref 3.5–5.1)
Sodium: 136 mmol/L (ref 135–145)

## 2020-07-15 LAB — MAGNESIUM
Magnesium: 2 mg/dL (ref 1.7–2.4)
Magnesium: 2.3 mg/dL (ref 1.7–2.4)

## 2020-07-15 LAB — ECHOCARDIOGRAM COMPLETE
AR max vel: 4.66 cm2
AV Area VTI: 5.78 cm2
AV Area mean vel: 4.69 cm2
AV Mean grad: 2 mmHg
AV Peak grad: 3.6 mmHg
Ao pk vel: 0.95 m/s
Area-P 1/2: 3.37 cm2
Calc EF: 69.7 %
Height: 71 in
S' Lateral: 2.9 cm
Single Plane A2C EF: 65.2 %
Single Plane A4C EF: 74 %
Weight: 2257.51 oz

## 2020-07-15 LAB — CBC
HCT: 33.4 % — ABNORMAL LOW (ref 39.0–52.0)
Hemoglobin: 10.2 g/dL — ABNORMAL LOW (ref 13.0–17.0)
MCH: 29.4 pg (ref 26.0–34.0)
MCHC: 30.5 g/dL (ref 30.0–36.0)
MCV: 96.3 fL (ref 80.0–100.0)
Platelets: 94 10*3/uL — ABNORMAL LOW (ref 150–400)
RBC: 3.47 MIL/uL — ABNORMAL LOW (ref 4.22–5.81)
RDW: 16.5 % — ABNORMAL HIGH (ref 11.5–15.5)
WBC: 9.5 10*3/uL (ref 4.0–10.5)
nRBC: 0 % (ref 0.0–0.2)

## 2020-07-15 LAB — GLUCOSE, CAPILLARY
Glucose-Capillary: 158 mg/dL — ABNORMAL HIGH (ref 70–99)
Glucose-Capillary: 82 mg/dL (ref 70–99)
Glucose-Capillary: 156 mg/dL — ABNORMAL HIGH (ref 70–99)
Glucose-Capillary: 211 mg/dL — ABNORMAL HIGH (ref 70–99)
Glucose-Capillary: 215 mg/dL — ABNORMAL HIGH (ref 70–99)
Glucose-Capillary: 143 mg/dL — ABNORMAL HIGH (ref 70–99)

## 2020-07-15 LAB — PHOSPHORUS
Phosphorus: 6.4 mg/dL — ABNORMAL HIGH (ref 2.5–4.6)
Phosphorus: 7.2 mg/dL — ABNORMAL HIGH (ref 2.5–4.6)

## 2020-07-15 LAB — PATHOLOGIST SMEAR REVIEW: Path Review: INCREASED

## 2020-07-15 LAB — TRIGLYCERIDES: Triglycerides: 66 mg/dL (ref <150)

## 2020-07-15 IMAGING — DX DG ABD PORTABLE 1V
1 series · 1 of 1 positions shown · non-contrast
Comparison: Chest CT of [DATE].

CLINICAL DATA: Encounter for feeding tube placement.

EXAM:
PORTABLE ABDOMEN - 1 VIEW

[abdomen]
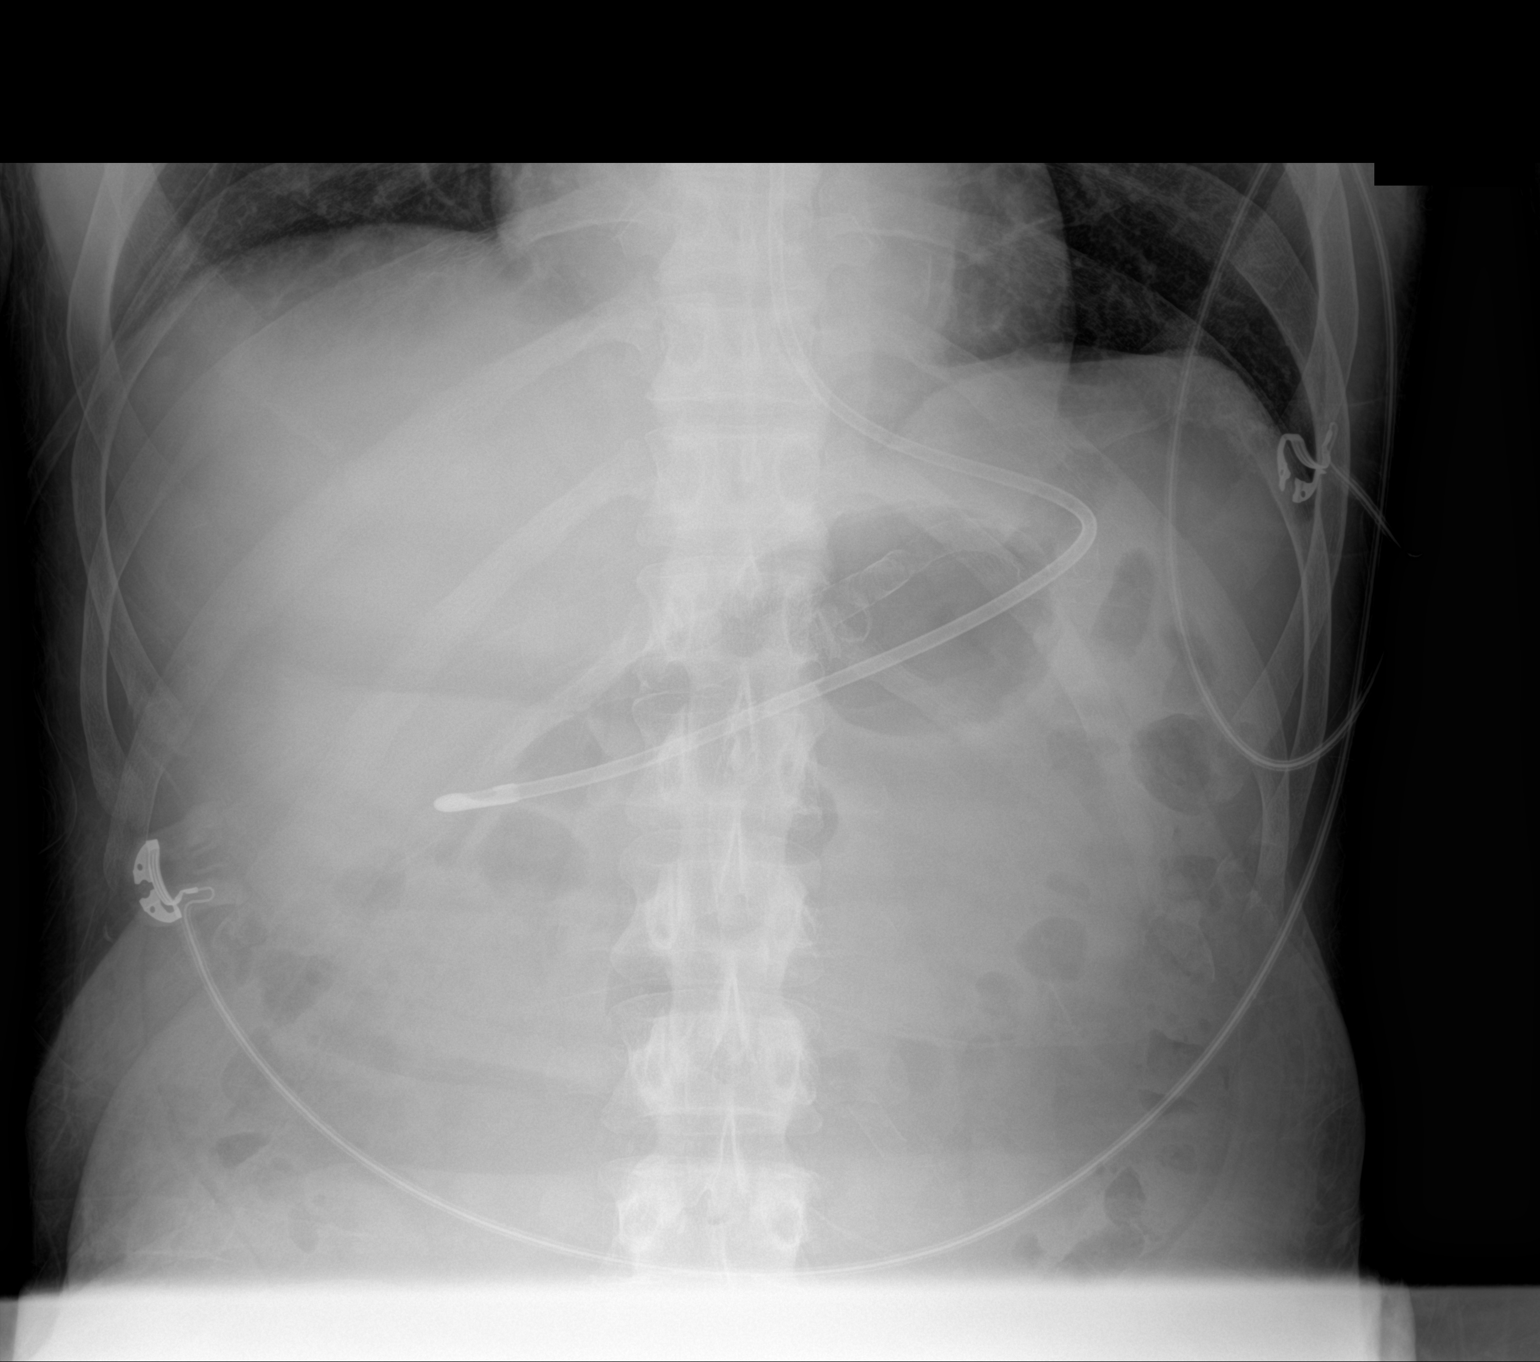

[1 of 1 positions shown; findings below may reference images not displayed]

FINDINGS: Lung bases are clear.

Feeding tube terminates in the antro pyloric stomach.

No abnormally dilated loops of bowel in the upper abdomen. Signs of
vascular calcifications along splenic artery distribution.

On limited assessment no acute regional skeletal process.
IMPRESSION: Feeding tube terminates in the antro pyloric stomach.

These results will be called to the ordering clinician or
representative by the Radiologist Assistant, and communication
documented in the PACS or [REDACTED].

## 2020-07-15 MED ORDER — SODIUM CHLORIDE 0.9% FLUSH
10.0000 mL | Freq: Two times a day (BID) | INTRAVENOUS | Status: DC
  Administered 2020-07-15 (×2): 30 mL
  Administered 2020-07-15: 10 mL
  Administered 2020-07-16 – 2020-07-17 (×4): 20 mL
  Administered 2020-07-18: 10 mL
  Administered 2020-07-18: 40 mL
  Administered 2020-07-19: 10 mL
  Administered 2020-07-19: 20 mL
  Administered 2020-07-20 – 2020-07-29 (×17): 10 mL

## 2020-07-15 MED ORDER — VANCOMYCIN HCL IN DEXTROSE 750-5 MG/150ML-% IV SOLN
750.0000 mg | INTRAVENOUS | Status: DC
  Administered 2020-07-15: 750 mg via INTRAVENOUS
  Filled 2020-07-15: qty 150

## 2020-07-15 MED ORDER — "THROMBI-PAD 3""X3"" EX PADS"
1.0000 | MEDICATED_PAD | Freq: Once | CUTANEOUS | Status: AC
  Administered 2020-07-15: 1 via TOPICAL
  Filled 2020-07-15: qty 1

## 2020-07-15 MED ORDER — FENTANYL CITRATE (PF) 100 MCG/2ML IJ SOLN
50.0000 ug | INTRAMUSCULAR | Status: DC | PRN
  Administered 2020-07-15 – 2020-07-16 (×4): 50 ug via INTRAVENOUS
  Administered 2020-07-16 – 2020-07-17 (×7): 100 ug via INTRAVENOUS
  Filled 2020-07-15 (×13): qty 2

## 2020-07-15 MED ORDER — INSULIN ASPART 100 UNIT/ML IJ SOLN
0.0000 [IU] | INTRAMUSCULAR | Status: DC
  Administered 2020-07-15 (×2): 5 [IU] via SUBCUTANEOUS
  Administered 2020-07-16: 8 [IU] via SUBCUTANEOUS
  Administered 2020-07-16 (×2): 5 [IU] via SUBCUTANEOUS
  Administered 2020-07-16: 8 [IU] via SUBCUTANEOUS
  Administered 2020-07-16: 5 [IU] via SUBCUTANEOUS
  Administered 2020-07-16: 2 [IU] via SUBCUTANEOUS
  Administered 2020-07-16: 5 [IU] via SUBCUTANEOUS
  Administered 2020-07-17: 8 [IU] via SUBCUTANEOUS
  Administered 2020-07-17: 5 [IU] via SUBCUTANEOUS
  Administered 2020-07-17: 8 [IU] via SUBCUTANEOUS
  Administered 2020-07-17 (×3): 5 [IU] via SUBCUTANEOUS
  Administered 2020-07-18: 8 [IU] via SUBCUTANEOUS
  Administered 2020-07-18: 3 [IU] via SUBCUTANEOUS
  Administered 2020-07-18 (×3): 8 [IU] via SUBCUTANEOUS
  Administered 2020-07-19 (×2): 2 [IU] via SUBCUTANEOUS
  Administered 2020-07-19: 5 [IU] via SUBCUTANEOUS
  Administered 2020-07-19: 3 [IU] via SUBCUTANEOUS
  Administered 2020-07-19 – 2020-07-20 (×2): 2 [IU] via SUBCUTANEOUS
  Administered 2020-07-20 (×2): 3 [IU] via SUBCUTANEOUS
  Administered 2020-07-20: 2 [IU] via SUBCUTANEOUS
  Administered 2020-07-20: 5 [IU] via SUBCUTANEOUS
  Administered 2020-07-20 – 2020-07-21 (×2): 3 [IU] via SUBCUTANEOUS
  Administered 2020-07-21 – 2020-07-22 (×2): 2 [IU] via SUBCUTANEOUS
  Administered 2020-07-22: 5 [IU] via SUBCUTANEOUS
  Administered 2020-07-22 – 2020-07-23 (×2): 3 [IU] via SUBCUTANEOUS
  Administered 2020-07-23: 5 [IU] via SUBCUTANEOUS
  Administered 2020-07-23: 2 [IU] via SUBCUTANEOUS
  Administered 2020-07-24: 3 [IU] via SUBCUTANEOUS
  Administered 2020-07-24: 2 [IU] via SUBCUTANEOUS
  Administered 2020-07-25: 3 [IU] via SUBCUTANEOUS

## 2020-07-15 MED ORDER — SODIUM CHLORIDE 0.9% FLUSH: 10.0000 mL | INTRAVENOUS | Status: DC | PRN

## 2020-07-15 NOTE — Progress Notes (Signed)
*  PRELIMINARY RESULTS* Echocardiogram 2D Echocardiogram has been performed.  Luisa Hart RDCS 07/15/2020, 12:39 PM

## 2020-07-15 NOTE — Procedures (Signed)
Patient Name: [name]  MRN: [id number]  Epilepsy Attending: Lora Havens  Referring Physician/Provider: Dr Ina Homes Date: 07/14/2020 Duration: 25.37 mins  Patient history: 50 year old male with altered mental status.  EEG evaluate for seizures.  Level of alertness: comatose  AEDs during EEG study: Propofol  Technical aspects: This EEG study was done with scalp electrodes positioned according to the 10-20 International system of electrode placement. Electrical activity was acquired at a sampling rate of '500Hz'$  and reviewed with a high frequency filter of '70Hz'$  and a low frequency filter of '1Hz'$ . EEG data were recorded continuously and digitally stored.   Description: EEG showed continuous generalized 3 to 5 Hz theta and delta slowing admixed with 15 to 18 Hz frontocentral beta activity.  Hyperventilation and photic stimulation were not performed.     ABNORMALITY - Continuous slow, generalized  IMPRESSION: This study is suggestive of severe diffuse encephalopathy, nonspecific etiology but likely related to sedation. No seizures or definite epileptiform discharges were seen throughout the recording.  Jorge [name] Jorge [name]

## 2020-07-15 NOTE — Progress Notes (Signed)
Patient getting dialysis at bedside. Upon HD nurse starting dialysis patient's blood pressure quickly dropped from 0000000 systolic to [id number] MAP of 59. HD nurse stopped pulling fluid and began to filter blood only. Levo restarted. Will continue to monitor closely.

## 2020-07-15 NOTE — Progress Notes (Signed)
[facility] Progress Note   Subjective:  last HD early AM on 7/7 with 2.6 kg UF - finished 5 am per charting.  Had LP on 7/7 - concern for possible meningitis/encephalitis.  Has been on levo at 2 mcg/min - levo started yesterday when intubated   Review of systems:  Unable to obtain given mechanical ventilation  Objective Vitals:   07/15/20 0545 07/15/20 0600 07/15/20 0615 07/15/20 0630  BP: (!) 142/66 (!) 103/53 (!) 114/59 (!) 108/55  Pulse: 74 73 75 73  Resp: 15 15 16 15   Temp:      TempSrc:      SpO2: 100% 100% 100% 100%  Weight:      Height:       Physical Exam  General adult male in bed intubated HEENT normocephalic atraumatic  Lungs coarse mechanical breath sounds FIO2 50 and PEEP 5  Heart S1S2 no rub Abdomen soft nontender with limit of sedation nondistended Extremities no edema; bilateral BKA Neuro - continuous sedation running Access RUE AVF bruit and thrill   Additional Objective Labs: Basic Metabolic Panel: Recent Labs  Lab 07/14/20 1008 07/14/20 1112 07/14/20 1822 07/15/20 0352  NA 135 135 136 136  K 4.1 4.3 3.9 4.3  CL 96*  --  97* 97*  CO2 24  --  27 25  GLUCOSE 179*  --  69* 169*  BUN 18  --  26* 32*  CREATININE 5.31*  --  6.20* 6.84*  CALCIUM 9.3  --  9.9 9.9  PHOS 5.1*  --  5.9* 6.4*   Liver Function Tests: Recent Labs  Lab 07/13/20 1549 07/13/20 2148 07/14/20 1008 07/14/20 1822  AST 20  --  18 20  ALT 15  --  13 13  ALKPHOS 88  --  75 71  BILITOT 1.0  --  1.4* 0.8  PROT 7.7  --  7.2 7.0  ALBUMIN 3.8 3.8 3.5 3.4*   CBC: Recent Labs  Lab 07/13/20 1549 07/13/20 1639 07/13/20 2148 07/14/20 0400 07/14/20 1008 07/14/20 1112 07/14/20 1822 07/15/20 0352  WBC 6.9  --  8.6 11.0* 9.2  --  10.9* 9.5  NEUTROABS 4.1  --   --   --  5.8  --  5.3  --   HGB 11.9*   < > 12.0* 11.4* 10.8* 11.9* 11.2* 10.2*  HCT 38.5*   < > 38.6* 34.6* 33.8* 35.0* 35.1* 33.4*  MCV 96.7  --  96.0 91.8 94.7  --  95.4 96.3  PLT 80*  --  96* 99*  82*  --  104* 94*   < > = values in this interval not displayed.   Blood Culture    Component Value Date/Time   SDES CSF 07/14/2020 1732   SPECREQUEST NONE 07/14/2020 1732   CULT PENDING 07/14/2020 1732   REPTSTATUS PENDING 07/14/2020 1732   CBG: Recent Labs  Lab 07/14/20 1528 07/14/20 1932 07/14/20 2020 07/14/20 2327 07/15/20 0329  GLUCAP 171* 61* 79 105* 158*   Studies/Results: CT Head Wo Contrast  Result Date: 07/13/2020 CLINICAL DATA:  Delirium. EXAM: CT HEAD WITHOUT CONTRAST TECHNIQUE: Contiguous axial images were obtained from the base of the skull through the vertex without intravenous contrast. COMPARISON:  Head CT and brain MRI May 09, 2020 and May 10, 2020 FINDINGS: Non traditional scan plane limits detailed assessment. There is also streak and motion artifact. Skull vertex is excluded from the field of view. Brain: No evidence of hemorrhage allowing for limitations. Stable atrophy. Periventricular chronic  small vessel ischemia. No hydrocephalus, midline shift, or mass effect. Remote cerebellar infarcts on prior MRI are not well defined on the current exam. No obvious acute ischemia, with evaluation limited by artifact. No subdural or extra-axial collection. Vascular: Atherosclerosis of skullbase vasculature without hyperdense vessel or abnormal calcification. Skull: No fracture or focal lesion. Sinuses/Orbits: Paranasal sinuses and mastoid air cells are clear. The visualized orbits are unremarkable. Other: None. IMPRESSION: 1. Technically limited exam as described. 2. Allowing for limitations, no acute intracranial abnormality. 3. Stable atrophy. Electronically Signed   By: Keith Rake M.D.   On: 07/13/2020 16:59   CT CHEST WO CONTRAST  Result Date: 07/14/2020 CLINICAL DATA:  Mediastinal mass. EXAM: CT CHEST WITHOUT CONTRAST TECHNIQUE: Multidetector CT imaging of the chest was performed following the standard protocol without IV contrast. COMPARISON:  Radiographs of same day.  FINDINGS: Cardiovascular: Atherosclerosis of thoracic aorta is noted without aneurysm formation. Normal cardiac size. No pericardial effusion. Coronary artery calcifications are noted. Left internal jugular catheter is seen directed in retrograde manner into right subclavian vein. Mediastinum/Nodes: Thyroid gland is unremarkable. Endotracheal tube is in grossly good position. Nasogastric tube is seen passing through esophagus and into stomach. There does appear to be right paratracheal and precarinal adenopathy, although evaluation is limited due the lack of intravenous contrast. Largest right paratracheal lymph node measures 16 mm. Precarinal lymph node measures 2 cm. Lungs/Pleura: No pneumothorax is noted. Minimal left posterior basilar subsegmental atelectasis is noted. Large right lower lobe airspace opacity is noted with air bronchograms most consistent with pneumonia. Upper Abdomen: No acute abnormality. Musculoskeletal: No chest wall mass or suspicious bone lesions identified. IMPRESSION: Mildly enlarged right paratracheal and precarinal adenopathy is noted with largest lymph node measuring 2 cm in precarinal region. This may be infectious or inflammatory in etiology, but malignancy or metastatic disease cannot be excluded. Evaluation is limited due to lack of intravenous contrast. Large right lower lobe airspace opacity is noted with air bronchograms concerning for pneumonia, potentially aspiration pneumonia. Coronary artery calcifications are noted consistent with coronary artery disease. Aortic Atherosclerosis (ICD10-I70.0). Electronically Signed   By: Marijo Conception M.D.   On: 07/14/2020 13:19   MR BRAIN WO CONTRAST  Result Date: 07/14/2020 CLINICAL DATA:  Headache and papilledema. End stage renal disease on dialysis. EXAM: MRI HEAD WITHOUT CONTRAST TECHNIQUE: Multiplanar, multiecho pulse sequences of the brain and surrounding structures were obtained without intravenous contrast. COMPARISON:  Head CT  yesterday.  MRI 05/10/2020. FINDINGS: Brain: Diffusion imaging shows a 7 mm acute infarction within the left cerebellum centrally. Punctate acute infarction in the right inferior posterior parietal white matter. Two small linear acute infarctions within the left frontal white matter. No large vessel territory acute infarction. Chronic small-vessel ischemic changes affect the pons. Several old small vessel cerebellar infarctions. Cerebral hemispheres show moderate chronic small-vessel disease of the deep white matter, old hemorrhagic infarction in the right external capsule region, an old small cortical and subcortical left frontal infarction. No evidence of mass, hydrocephalus or extra-axial collection. Vascular: Major vessels at the base of the brain show flow. Skull and upper cervical spine: Negative. Marrow changes likely related to chronic hemodialysis. Sinuses/Orbits: Clear/normal Other: None IMPRESSION: 4 small acute infarctions, left cerebellar white matter, right posteroinferior parietal white matter and 2 in the left frontal white matter. Findings could be due to embolic disease or concurrent ordinary small-vessel infarctions. Atrophy and extensive chronic small-vessel ischemic changes elsewhere. Old hemorrhagic infarction in the right external capsule. Old left frontal cortical and  subcortical infarction. No abnormality seen to explain papilledema. No evidence of mass lesion or acute hemorrhage. Electronically Signed   By: Nelson Chimes M.D.   On: 07/14/2020 12:53   DG CHEST PORT 1 VIEW  Result Date: 07/14/2020 CLINICAL DATA:  Orogastric tube placement. EXAM: PORTABLE CHEST 1 VIEW COMPARISON:  Same day. FINDINGS: Stable cardiac size. Endotracheal tube is in grossly good position. Nasogastric tube tip is seen in expected position of distal stomach. Left internal jugular catheter is noted with tip directed into the right subclavian vein. No pneumothorax or pleural effusion is noted. IMPRESSION:  Endotracheal and nasogastric tubes are in grossly good position. Left internal jugular catheter is noted with tip directed into the right subclavian vein; repositioning is recommended. These results will be called to the ordering clinician or representative by the Radiologist Assistant, and communication documented in the PACS or zVision Dashboard. Electronically Signed   By: Marijo Conception M.D.   On: 07/14/2020 10:30   DG CHEST PORT 1 VIEW  Result Date: 07/14/2020 CLINICAL DATA:  Altered mental status. EXAM: PORTABLE CHEST 1 VIEW COMPARISON:  07/13/2020.  03/08/2020.  CT neck 10/04/2019. FINDINGS: Persistent right upper mediastinal soft tissue fullness. Neck CT of 10/04/2019 revealed mediastinal lymph nodes. Contrast-enhanced CT of the chest should be considered for further evaluation. Low lung volumes. Mild right base infiltrate cannot be excluded. No pleural effusion or pneumothorax. Heart size normal. No acute bony abnormality. IMPRESSION: 1. Persistent right upper mediastinal soft tissue fullness suggesting a mediastinal mass. Neck CT of 10/04/2019 revealed mediastinal lymph nodes. Contrast-enhanced CT of the chest should be considered for further evaluation. 2. Low lung volumes. Mild right base infiltrate cannot be excluded. Electronically Signed   By: Marcello Moores  Register   On: 07/14/2020 07:03   DG Chest Port 1 View  Result Date: 07/13/2020 CLINICAL DATA:  Sudden onset of altered mental status during hemodialysis today. EXAM: PORTABLE CHEST 1 VIEW COMPARISON:  Radiographs 03/08/2020 and 10/03/2019. Neck CTA 10/04/2019. FINDINGS: 1649 hours. Mild patient rotation to the right. There is stable cardiomegaly. There is mildly increased right paratracheal soft tissue prominence which may relate to the rotation. On the prior CTA, there were prominent lymph nodes and fat in the right paratracheal region. The lungs appear clear. There is no pleural effusion or pneumothorax. No acute osseous findings are evident.  IMPRESSION: No definite acute cardiopulmonary process. Increased soft tissue fullness in the right paratracheal region may relate to patient rotation. Attention on follow-up recommended; if persistent, follow-up CT warranted. Electronically Signed   By: Richardean Sale M.D.   On: 07/13/2020 17:24    Medications:  sodium chloride     sodium chloride     sodium chloride     sodium chloride 10 mL/hr at 07/15/20 0600   acyclovir     cefTRIAXone (ROCEPHIN)  IV Stopped (07/15/20 0539)   dexmedetomidine (PRECEDEX) IV infusion Stopped (07/15/20 0015)   feeding supplement (VITAL 1.5 CAL) 30 mL/hr at 07/14/20 2332   fentaNYL infusion INTRAVENOUS 75 mcg/hr (07/15/20 0600)   norepinephrine (LEVOPHED) Adult infusion 1.5 mcg/min (07/15/20 0600)   propofol (DIPRIVAN) infusion 5 mcg/kg/min (07/15/20 0600)    aspirin  81 mg Per Tube Daily   atorvastatin  40 mg Per Tube Daily   chlorhexidine gluconate (MEDLINE KIT)  15 mL Mouth Rinse BID   Chlorhexidine Gluconate Cloth  6 each Topical Q0600   docusate  100 mg Per Tube BID   feeding supplement (PROSource TF)  45 mL Per Tube TID  fentaNYL (SUBLIMAZE) injection  50 mcg Intravenous Once   heparin injection (subcutaneous)  5,000 Units Subcutaneous Q8H   insulin aspart  0-20 Units Subcutaneous Q4H   insulin aspart  2 Units Subcutaneous Q4H   LORazepam  2 mg Intravenous Once   mouth rinse  15 mL Mouth Rinse 10 times per day   mupirocin ointment  1 application Nasal BID   pantoprazole sodium  40 mg Per Tube Daily   polyethylene glycol  17 g Per Tube Daily   sodium chloride flush  10-40 mL Intracatheter Q12H   sodium chloride flush  3 mL Intravenous Q12H   vancomycin variable dose per unstable renal function (pharmacist dosing)   Does not apply See admin instructions    Dialysis Orders: MWF @ [place] 4hr, 400/A1.5, EDW 68kg, 2K/2.5Ca, AVF, UFP #2, no heparin - Hecoral 12mg Iv q HD  Assessment/Plan: # Hypertensive Urgency:  improved however on pressor  since intubation  #  Hyperkalemia: improved with HD  # Acute hypoxic resp failure. Intubated 7/7.  Per critical care.    # ESRD: transition back to HD per MWF schedule   # Enterococcus bacteremia - abx per primary team.   # Anemia of CKD no ESA needs.  # Secondary hyperparathyroidism: hold VDRA for calcium.  Hyperphos - resume binders once eating or feeds  # T2DM - per primary team  # Hx ICH - head CT without acute abnormality  # Severe agitation: Unclear etiology. Blood Cx 7/6 with enterococcus.  Had LP on 7/7  # ?Mediastinal mass: CT malignancy cannot be excluded per CT; also with PNA per CT  LClaudia Desanctis MD 07/15/2020, 6:55 AM [facility]

## 2020-07-15 NOTE — Procedures (Signed)
Cortrak  Person Inserting Tube:  Amani Nodarse, RD Tube Type:  Cortrak - 43 inches Tube Size:  10 Tube Location:  Left nare Initial Placement:  Stomach Secured by: Bridle Technique Used to Measure Tube Placement:  Marking at nare/corner of mouth Cortrak Secured At:  75 cm   Cortrak Tube Team Note:  Consult received to place a Cortrak feeding tube.   X-ray is required, abdominal x-ray has been ordered by the Cortrak team. Please confirm tube placement before using the Cortrak tube.   If the tube becomes dislodged please keep the tube and contact the Cortrak team at [email or web address] ([email or web address]) for replacement.  If after hours and replacement cannot be delayed, place a NG tube and confirm placement with an abdominal x-ray.   Mariana Single MS, RD, LDN, CNSC Clinical Nutrition Pager listed in [place]

## 2020-07-15 NOTE — Progress Notes (Addendum)
NAME:  Jorge [name], MRN:  [id number], DOB:  18-Jun-1970, LOS: 2 ADMISSION DATE:  07/13/2020, CONSULTATION DATE:  07/13/20 REFERRING MD:  Rogers Blocker CHIEF COMPLAINT:  AMS, HTN   History of Present Illness:  Jorge [name] is a 50 y.o. male who has a PMH including but not limited to ESRD on HD MWF, bilateral BKA, DM, ICH, TIA, HTN, HLD.  He was admitted 07/13/2020 with AMS and volume overload.  He had apparently been at his regular dialysis session and had gotten roughly 1 hour of treatment before he began to pull at lines and become confused.  He was taken off dialysis and was sent to the ED.  In the ED, he was confused and blood pressure was initially normal; however, after several hours, he became hypoxic, progressively more hypotensive into the 123456 systolic, and had worsening AMS. He was admitted by [facility] with plans for dialysis overnight.  He received IV hydralazine and labetalol with minimal improvement in blood pressure.  PCCM subsequently called as it was felt that he would need continuous infusion.  He also had agitation and was not fully cooperative with nursing staff and required additional dose of Haldol. He has been seen by nephrology who plans for emergent dialysis tonight.  Pertinent  Medical History:  has ESRD (end stage renal disease) on dialysis ([place]); Hypertension associated with diabetes ([place]); Type 2 diabetes mellitus with chronic kidney disease on chronic dialysis, with long-term current use of insulin ([place]); ESRD on peritoneal dialysis ([place]); S/P BKA (below knee amputation) bilateral ([place]); Labile blood glucose; Anemia of chronic disease; Gastroparesis due to DM ([place]); Aluminum bone disease; Encounter for adequacy testing for peritoneal dialysis [facility]); Other disorders of bilirubin metabolism; Other disorders resulting from impaired renal tubular function; Coagulation defect, unspecified ([place]); Personal history of anaphylaxis; Secondary hyperparathyroidism of renal origin ([place]); Acute  encephalopathy; TIA (transient ischemic attack); Stupor; Hyperlipidemia associated with type 2 diabetes mellitus ([place]); AMS (altered mental status); Hypertensive urgency; and Malnutrition of moderate degree on their problem list.  Significant Hospital Events: Including procedures, antibiotic start and stop dates in addition to other pertinent events   7/6 admit. 7/7 intubation/CVL/LP/MRI/EEG  Interim History / Subjective:  On vent LP equivocal for meningitis, await culture and HSV  Objective:  Blood pressure 130/64, pulse 75, temperature 98 F (36.7 C), temperature source Axillary, resp. rate 15, height '5\' 11"'$  (1.803 m), weight 64 kg, SpO2 100 %.    Vent Mode: PRVC FiO2 (%):  [50 %] 50 % Set Rate:  [15 bmp] 15 bmp Vt Set:  [600 mL] 600 mL PEEP:  [5 cmH20] 5 cmH20 Plateau Pressure:  [14 cmH20-15 cmH20] 15 cmH20   Intake/Output Summary (Last 24 hours) at 07/15/2020 0732 Last data filed at 07/15/2020 0700 Gross per 24 hour  Intake 2128.69 ml  Output --  Net 2128.69 ml    Filed Weights   07/14/20 0030 07/15/20 0500  Weight: 68.6 kg 64 kg    Examination: Constitutional: starting to wake up  Eyes: pupils small, reactive Ears, nose, mouth, and throat: ETT with minimal secretions Cardiovascular: RRR, BL BKA Respiratory: clear, triggering vent Gastrointestinal: soft, +BS Skin: No rashes, normal turgor Neurologic: withdraws x 4, not following commands Psychiatric: RASS -2    Labs/imaging personally reviewed:  CT head 7/6  > no acute process.  Resolved Hospital Problem list:    Assessment & Plan:  Persistent severe encephalopathy, MRI without clear cause has a bunch of small infarcts, LP equivocal for meningitis, will work on  calculating corrected WBC count with PharmD resident Esmond Camper.  Ammonia normal.  TSH normal.  No improvement with HD. Multiple tiny CVAs- will check echo but suspect related to small vessel disease and BP swings with HD Severe hyperkalemia post emergent  HD Variable Bps now hypotensive with propofol DM with hyperglycemia now hypoglycemia off insulin- borderline gap, borderline beta hydroxybutyrate Staph species 1/4 BC on admit- suspect contamination, would not use to guide therapy RLL Pna, adenopathy- meningitis bugs should cover this, question aspiration, esophagus also a bit patulous may warrant further questioning once more awake.  Adenopathy will need OP f/u.  - HD today then start precedex, wean propofol, SAT/SBT and potential extubation - Cortrak pre-emptively - f/u CSF results including HSV, continue meningitis coverage for now (vanc/rocephin/acyclovir) - f/u EEG, echo - soft wrist restraints for safety  Best practice (evaluated daily):  Diet/type: TF DVT prophylaxis: heparin GI prophylaxis: ppi Lines: yes Foley:  condom (makes small amount of urine) Code Status:  full code Last date of multidisciplinary goals of care discussion: continue aggressive care, 7/7  34 minutes cc time independent of separately billed procedures

## 2020-07-15 NOTE — Progress Notes (Addendum)
Pharmacy Antibiotic Note  Jorge [name] is a 50 y.o. male admitted on 07/13/2020 with AMS and now with concern for meningitis with cloudy LP tap. Pharmacy has been consulted for Vancomycin + Acyclovir dosing along with Rocephin per MD.   ESRD - continue MWF schedule with plan for dialysis today.  WBC normal, patient afebrile.    Plan: - Start vancomycin '750mg'$  after dialysis sessions MWF.  - Continue Rocephin to 2g IV every 12 hours - Continue Acyclovir 350 mg (~5 mg/kg) every 24 hours - Will continue to follow plans for HD, culture results, LOT, and antibiotic de-escalation plans   Height: '5\' 11"'$  (180.3 cm) Weight: 64 kg (141 lb 1.5 oz) IBW/kg (Calculated) : 75.3  Temp (24hrs), Avg:98.9 F (37.2 C), Min:98 F (36.7 C), Max:99.6 F (37.6 C)  Recent Labs  Lab 07/13/20 2148 07/14/20 0058 07/14/20 0400 07/14/20 1008 07/14/20 1822 07/15/20 0352  WBC 8.6  --  11.0* 9.2 10.9* 9.5  CREATININE 8.55* 8.95*  --  5.31* 6.20* 6.84*  LATICACIDVEN  --   --   --  1.1  --   --      Estimated Creatinine Clearance: 11.4 mL/min (A) (by C-G formula based on SCr of 6.84 mg/dL (H)).    Allergies  Allergen Reactions   Lactose Intolerance (Gi) Diarrhea and Nausea Only   Vanc 7/7 >> CTX 7/7 >> Acyclovir 7/7 >>   7/6 MRSA PCR Positive 7/6 BCx - 1/4 GPC (BCID Staph species) - likely contaminant 7/7 CSF cx - pending  Donald Pore, PharmD Pharmacy Resident 07/15/2020, 10:09 AM

## 2020-07-16 DIAGNOSIS — G9340 Encephalopathy, unspecified: Secondary | ICD-10-CM | POA: Diagnosis not present

## 2020-07-16 DIAGNOSIS — N186 End stage renal disease: Secondary | ICD-10-CM | POA: Diagnosis not present

## 2020-07-16 DIAGNOSIS — Z992 Dependence on renal dialysis: Secondary | ICD-10-CM

## 2020-07-16 LAB — CBC
HCT: 36.1 % — ABNORMAL LOW (ref 39.0–52.0)
Hemoglobin: 11.4 g/dL — ABNORMAL LOW (ref 13.0–17.0)
MCH: 30.1 pg (ref 26.0–34.0)
MCHC: 31.6 g/dL (ref 30.0–36.0)
MCV: 95.3 fL (ref 80.0–100.0)
Platelets: 82 10*3/uL — ABNORMAL LOW (ref 150–400)
RBC: 3.79 MIL/uL — ABNORMAL LOW (ref 4.22–5.81)
RDW: 16.2 % — ABNORMAL HIGH (ref 11.5–15.5)
WBC: 7.3 10*3/uL (ref 4.0–10.5)
nRBC: 0 % (ref 0.0–0.2)

## 2020-07-16 LAB — BASIC METABOLIC PANEL
Anion gap: 13 (ref 5–15)
BUN: 17 mg/dL (ref 6–20)
CO2: 27 mmol/L (ref 22–32)
Calcium: 9.3 mg/dL (ref 8.9–10.3)
Chloride: 94 mmol/L — ABNORMAL LOW (ref 98–111)
Creatinine, Ser: 4.4 mg/dL — ABNORMAL HIGH (ref 0.61–1.24)
GFR, Estimated: 15 mL/min — ABNORMAL LOW (ref >60)
Glucose, Bld: 209 mg/dL — ABNORMAL HIGH (ref 70–99)
Potassium: 3.6 mmol/L (ref 3.5–5.1)
Sodium: 134 mmol/L — ABNORMAL LOW (ref 135–145)

## 2020-07-16 LAB — GLUCOSE, CAPILLARY
Glucose-Capillary: 202 mg/dL — ABNORMAL HIGH (ref 70–99)
Glucose-Capillary: 229 mg/dL — ABNORMAL HIGH (ref 70–99)
Glucose-Capillary: 242 mg/dL — ABNORMAL HIGH (ref 70–99)
Glucose-Capillary: 259 mg/dL — ABNORMAL HIGH (ref 70–99)
Glucose-Capillary: 293 mg/dL — ABNORMAL HIGH (ref 70–99)
Glucose-Capillary: 221 mg/dL — ABNORMAL HIGH (ref 70–99)

## 2020-07-16 LAB — HSV 1/2 PCR, CSF
HSV-1 DNA: NEGATIVE
HSV-2 DNA: NEGATIVE

## 2020-07-16 LAB — CULTURE, BLOOD (ROUTINE X 2)

## 2020-07-16 MED ORDER — SEVELAMER CARBONATE 800 MG PO TABS
1600.0000 mg | ORAL_TABLET | Freq: Three times a day (TID) | ORAL | Status: DC
  Administered 2020-07-16 – 2020-07-17 (×3): 1600 mg via ORAL
  Filled 2020-07-16 (×3): qty 2

## 2020-07-16 MED ORDER — NEPRO/CARBSTEADY PO LIQD
1000.0000 mL | ORAL | Status: DC
  Administered 2020-07-16 – 2020-07-17 (×3): 1000 mL
  Filled 2020-07-16 (×5): qty 1000

## 2020-07-16 MED ORDER — CHLORHEXIDINE GLUCONATE CLOTH 2 % EX PADS
6.0000 | MEDICATED_PAD | Freq: Every day | CUTANEOUS | Status: DC
  Administered 2020-07-17: 6 via TOPICAL

## 2020-07-16 MED ORDER — ORAL CARE MOUTH RINSE
15.0000 mL | Freq: Two times a day (BID) | OROMUCOSAL | Status: DC
  Administered 2020-07-17 (×2): 15 mL via OROMUCOSAL

## 2020-07-16 NOTE — Progress Notes (Signed)
NAME:  Jorge [name], MRN:  [id number], DOB:  [birth year], LOS: 3 ADMISSION DATE:  07/13/2020, CONSULTATION DATE:  07/13/20 REFERRING MD:  Rogers Blocker CHIEF COMPLAINT:  AMS, HTN   History of Present Illness:  Jorge [name] is a 50 y.o. male who has a PMH including but not limited to ESRD on HD MWF, bilateral BKA, DM, ICH, TIA, HTN, HLD.  He was admitted 07/13/2020 with AMS and volume overload.  He had apparently been at his regular dialysis session and had gotten roughly 1 hour of treatment before he began to pull at lines and become confused.  He was taken off dialysis and was sent to the ED.  In the ED, he was confused and blood pressure was initially normal; however, after several hours, he became hypoxic, progressively more hypotensive into the 123456 systolic, and had worsening AMS. He was admitted by [facility] with plans for dialysis overnight.  He received IV hydralazine and labetalol with minimal improvement in blood pressure.  PCCM subsequently called as it was felt that he would need continuous infusion.  He also had agitation and was not fully cooperative with nursing staff and required additional dose of Haldol. He has been seen by nephrology who plans for emergent dialysis tonight.  Pertinent  Medical History:  has ESRD (end stage renal disease) on dialysis ([place]); Hypertension associated with diabetes ([place]); Type 2 diabetes mellitus with chronic kidney disease on chronic dialysis, with long-term current use of insulin ([place]); ESRD on peritoneal dialysis ([place]); S/P BKA (below knee amputation) bilateral ([place]); Labile blood glucose; Anemia of chronic disease; Gastroparesis due to DM ([place]); Aluminum bone disease; Encounter for adequacy testing for peritoneal dialysis [facility]); Other disorders of bilirubin metabolism; Other disorders resulting from impaired renal tubular function; Coagulation defect, unspecified ([place]); Personal history of anaphylaxis; Secondary hyperparathyroidism of renal origin ([place]); Acute  encephalopathy; TIA (transient ischemic attack); Stupor; Hyperlipidemia associated with type 2 diabetes mellitus ([place]); AMS (altered mental status); Hypertensive urgency; and Malnutrition of moderate degree on their problem list.  Significant Hospital Events: Including procedures, antibiotic start and stop dates in addition to other pertinent events   7/6 admit. 7/7 intubation/CVL/LP/MRI/EEG  Interim History / Subjective:   Still too encephalopathic to extubate. Not following commands   Objective:  Blood pressure (!) 149/77, pulse 80, temperature 99.3 F (37.4 C), temperature source Axillary, resp. rate 15, height '5\' 11"'$  (1.803 m), weight 64.4 kg, SpO2 100 %.    Vent Mode: PRVC FiO2 (%):  [40 %-50 %] 40 % Set Rate:  [15 bmp] 15 bmp Vt Set:  [600 mL] 600 mL PEEP:  [5 cmH20] 5 cmH20 Plateau Pressure:  [12 cmH20-14 cmH20] 14 cmH20   Intake/Output Summary (Last 24 hours) at 07/16/2020 0759 Last data filed at 07/16/2020 0600 Gross per 24 hour  Intake 1988.49 ml  Output 851 ml  Net 1137.49 ml   Filed Weights   07/14/20 0030 07/15/20 0500 07/16/20 0500  Weight: 68.6 kg 64 kg 64.4 kg    Examination: Constitutional: middle aged male, appears older than stated age Eyes: pupils reactive, not tracking  HENT: NCAT, ETT in place Cardiovascular: RRR, BL BKA  Respiratory: BL vented breaths  Gastrointestinal: soft, nt nd  Skin: no rash  Neurologic: withdraws to pain  Psychiatric: RASS -3 this AM  on precedex    Labs/imaging personally reviewed:  CT head 7/6  > no acute process. EEG - no seizure  ECHO - no veg, normal EF   Sodium 134 WBC 7.3  CSF  studies reviewed   Resolved Hospital Problem list:    Assessment & Plan:   Persistent severe acute metabolic encephalopathy, present on admission  MRI without clear cause has a bunch of small infarcts, LP equivocal for meningitis, (Elevated WBC but also has high RBC count), Ammonia normal.  TSH normal.  No improvement with HD. HSV PCR  pending, possible meningitis. EEG with no seizures  Multiple tiny bilateral CVAs- will check echo but suspect related to small vessel disease and BP swings with HD. TTE neg for veg. Normal EF Severe hyperkalemia post emergent HD, resolved  Bp now stable  DM with hyperglycemia now hypoglycemia off insulin- borderline gap, borderline beta hydroxybutyrate Staph species 1/4 BC on admit- suspect contamination, would not use to guide therapy RLL aspiration pneumonia, present on admission, adenopathy ESRD on iHD  Clogged cortrac Prolonged qtc  Plan:  Continue HD per nephrology  Continue precedex, avoid other sedating meds (ie benzos, can use prn opiates if needed)  Nursing to work and [employer] He is anuric so I would remove his condom cath  Continue vanc/rocephin/acyclovir until hsv pcr is back and cultures are back.  I suspect would be able to de-escalate vanco soon  SAT SBT once mental status allows   Best practice (evaluated daily):  Diet/type: TF DVT prophylaxis: heparin GI prophylaxis: ppi Lines: yes Foley:  condom (makes small amount of urine) Code Status:  full code Last date of multidisciplinary goals of care discussion: continue aggressive care, 7/7  This patient is critically ill with multiple organ system failure; which, requires frequent high complexity decision making, assessment, support, evaluation, and titration of therapies. This was completed through the application of advanced monitoring technologies and extensive interpretation of multiple databases. During this encounter critical care time was devoted to patient care services described in this note for 32 minutes.  Garner Nash, DO [facility] 07/16/2020 9:37 AM

## 2020-07-16 NOTE — Progress Notes (Signed)
Notified Elink about paitent's systolic blood pressures in the 170's-180's. Levo has been off since completion of dialysis '@0000'$ . Patient having increased agitation while on Precedex drip and prn Fentanyl given. Blood pressures remaining in the [id number] Systolic. Will continue to monitor closely.  '@0400'$  Elink changed PRN hydralazine order parameters from SBP>220 to >180.

## 2020-07-16 NOTE — Progress Notes (Signed)
[facility] Progress Note   Subjective:  last HD 7/8 with 0.9 kg UF (with goal of 2 kg).  BP has been labile possibly depending on sedation per nursing.  Levo has been off of since midnight     Review of systems:  Unable to obtain given mechanical ventilation  Objective Vitals:   07/16/20 0602 07/16/20 0615 07/16/20 0630 07/16/20 0645  BP: (!) 183/91 (!) 162/76 (!) 154/85 (!) 145/71  Pulse: 79 81 81 80  Resp: 16 (!) 21 (!) 22 17  Temp:      TempSrc:      SpO2: 100% 100% 100% 100%  Weight:      Height:       Physical Exam  General adult male in bed intubated HEENT normocephalic atraumatic  Lungs coarse mechanical breath sounds FIO2 40 and PEEP 5  Heart S1S2 no rub Abdomen soft nontender with limit of sedation nondistended Extremities no edema; bilateral BKA Neuro - continuous sedation running Access RUE AVF bruit and thrill   Additional Objective Labs: Basic Metabolic Panel: Recent Labs  Lab 07/14/20 1822 07/15/20 0352 07/15/20 1603 07/16/20 0317  NA 136 136  --  134*  K 3.9 4.3  --  3.6  CL 97* 97*  --  94*  CO2 27 25  --  27  GLUCOSE 69* 169*  --  209*  BUN 26* 32*  --  17  CREATININE 6.20* 6.84*  --  4.40*  CALCIUM 9.9 9.9  --  9.3  PHOS 5.9* 6.4* 7.2*  --    Liver Function Tests: Recent Labs  Lab 07/13/20 1549 07/13/20 2148 07/14/20 1008 07/14/20 1822  AST 20  --  18 20  ALT 15  --  13 13  ALKPHOS 88  --  75 71  BILITOT 1.0  --  1.4* 0.8  PROT 7.7  --  7.2 7.0  ALBUMIN 3.8 3.8 3.5 3.4*   CBC: Recent Labs  Lab 07/13/20 1549 07/13/20 1639 07/14/20 0400 07/14/20 1008 07/14/20 1112 07/14/20 1822 07/15/20 0352 07/16/20 0317  WBC 6.9   < > 11.0* 9.2  --  10.9* 9.5 7.3  NEUTROABS 4.1  --   --  5.8  --  5.3  --   --   HGB 11.9*   < > 11.4* 10.8*   < > 11.2* 10.2* 11.4*  HCT 38.5*   < > 34.6* 33.8*   < > 35.1* 33.4* 36.1*  MCV 96.7   < > 91.8 94.7  --  95.4 96.3 95.3  PLT 80*   < > 99* 82*  --  104* 94* 82*   < > = values in  this interval not displayed.   Blood Culture    Component Value Date/Time   SDES CSF 07/14/2020 1732   SPECREQUEST NONE 07/14/2020 1732   CULT  07/14/2020 1732    NO GROWTH < 24 HOURS Performed at [facility], [place] [address] [place], [place] [address]    REPTSTATUS PENDING 07/14/2020 1732   CBG: Recent Labs  Lab 07/15/20 1516 07/15/20 1930 07/15/20 2321 07/16/20 0312 07/16/20 0722  GLUCAP 211* 215* 143* 202* 229*   Studies/Results: CT CHEST WO CONTRAST  Result Date: 07/14/2020 CLINICAL DATA:  Mediastinal mass. EXAM: CT CHEST WITHOUT CONTRAST TECHNIQUE: Multidetector CT imaging of the chest was performed following the standard protocol without IV contrast. COMPARISON:  Radiographs of same day. FINDINGS: Cardiovascular: Atherosclerosis of thoracic aorta is noted without aneurysm formation. Normal cardiac size. No  pericardial effusion. Coronary artery calcifications are noted. Left internal jugular catheter is seen directed in retrograde manner into right subclavian vein. Mediastinum/Nodes: Thyroid gland is unremarkable. Endotracheal tube is in grossly good position. Nasogastric tube is seen passing through esophagus and into stomach. There does appear to be right paratracheal and precarinal adenopathy, although evaluation is limited due the lack of intravenous contrast. Largest right paratracheal lymph node measures 16 mm. Precarinal lymph node measures 2 cm. Lungs/Pleura: No pneumothorax is noted. Minimal left posterior basilar subsegmental atelectasis is noted. Large right lower lobe airspace opacity is noted with air bronchograms most consistent with pneumonia. Upper Abdomen: No acute abnormality. Musculoskeletal: No chest wall mass or suspicious bone lesions identified. IMPRESSION: Mildly enlarged right paratracheal and precarinal adenopathy is noted with largest lymph node measuring 2 cm in precarinal region. This may be infectious or inflammatory in etiology, but malignancy or  metastatic disease cannot be excluded. Evaluation is limited due to lack of intravenous contrast. Large right lower lobe airspace opacity is noted with air bronchograms concerning for pneumonia, potentially aspiration pneumonia. Coronary artery calcifications are noted consistent with coronary artery disease. Aortic Atherosclerosis (ICD10-I70.0). Electronically Signed   By: Marijo Conception M.D.   On: 07/14/2020 13:19   MR BRAIN WO CONTRAST  Result Date: 07/14/2020 CLINICAL DATA:  Headache and papilledema. End stage renal disease on dialysis. EXAM: MRI HEAD WITHOUT CONTRAST TECHNIQUE: Multiplanar, multiecho pulse sequences of the brain and surrounding structures were obtained without intravenous contrast. COMPARISON:  Head CT yesterday.  MRI 05/10/2020. FINDINGS: Brain: Diffusion imaging shows a 7 mm acute infarction within the left cerebellum centrally. Punctate acute infarction in the right inferior posterior parietal white matter. Two small linear acute infarctions within the left frontal white matter. No large vessel territory acute infarction. Chronic small-vessel ischemic changes affect the pons. Several old small vessel cerebellar infarctions. Cerebral hemispheres show moderate chronic small-vessel disease of the deep white matter, old hemorrhagic infarction in the right external capsule region, an old small cortical and subcortical left frontal infarction. No evidence of mass, hydrocephalus or extra-axial collection. Vascular: Major vessels at the base of the brain show flow. Skull and upper cervical spine: Negative. Marrow changes likely related to chronic hemodialysis. Sinuses/Orbits: Clear/normal Other: None IMPRESSION: 4 small acute infarctions, left cerebellar white matter, right posteroinferior parietal white matter and 2 in the left frontal white matter. Findings could be due to embolic disease or concurrent ordinary small-vessel infarctions. Atrophy and extensive chronic small-vessel ischemic  changes elsewhere. Old hemorrhagic infarction in the right external capsule. Old left frontal cortical and subcortical infarction. No abnormality seen to explain papilledema. No evidence of mass lesion or acute hemorrhage. Electronically Signed   By: Nelson Chimes M.D.   On: 07/14/2020 12:53   DG CHEST PORT 1 VIEW  Result Date: 07/14/2020 CLINICAL DATA:  Orogastric tube placement. EXAM: PORTABLE CHEST 1 VIEW COMPARISON:  Same day. FINDINGS: Stable cardiac size. Endotracheal tube is in grossly good position. Nasogastric tube tip is seen in expected position of distal stomach. Left internal jugular catheter is noted with tip directed into the right subclavian vein. No pneumothorax or pleural effusion is noted. IMPRESSION: Endotracheal and nasogastric tubes are in grossly good position. Left internal jugular catheter is noted with tip directed into the right subclavian vein; repositioning is recommended. These results will be called to the ordering clinician or representative by the Radiologist Assistant, and communication documented in the PACS or [employer] Dashboard. Electronically Signed   By: Bobbe Medico.D.  On: 07/14/2020 10:30   DG Abd Portable 1V  Result Date: 07/15/2020 CLINICAL DATA:  Encounter for feeding tube placement. EXAM: PORTABLE ABDOMEN - 1 VIEW COMPARISON:  Chest CT of July 14, 2020. FINDINGS: Lung bases are clear. Feeding tube terminates in the antro pyloric stomach. No abnormally dilated loops of bowel in the upper abdomen. Signs of vascular calcifications along splenic artery distribution. On limited assessment no acute regional skeletal process. IMPRESSION: Feeding tube terminates in the antro pyloric stomach. These results will be called to the ordering clinician or representative by the Radiologist Assistant, and communication documented in the PACS or [employer]. Electronically Signed   By: Zetta Bills M.D.   On: 07/15/2020 13:16   EEG adult  Result Date: 07/15/2020 Lora Havens, MD     07/15/2020  9:00 AM Patient Name: KANO HECKMANN MRN: [id number] Epilepsy Attending: Lora Havens Referring Physician/Provider: Dr Ina Homes Date: 07/14/2020 Duration: 25.37 mins Patient history: 50 year old male with altered mental status.  EEG evaluate for seizures. Level of alertness: comatose AEDs during EEG study: Propofol Technical aspects: This EEG study was done with scalp electrodes positioned according to the 10-20 International system of electrode placement. Electrical activity was acquired at a sampling rate of 500Hz  and reviewed with a high frequency filter of 70Hz  and a low frequency filter of 1Hz . EEG data were recorded continuously and digitally stored. Description: EEG showed continuous generalized 3 to 5 Hz theta and delta slowing admixed with 15 to 18 Hz frontocentral beta activity.  Hyperventilation and photic stimulation were not performed.   ABNORMALITY - Continuous slow, generalized IMPRESSION: This study is suggestive of severe diffuse encephalopathy, nonspecific etiology but likely related to sedation. No seizures or definite epileptiform discharges were seen throughout the recording. Lora Havens   ECHOCARDIOGRAM COMPLETE  Result Date: 07/15/2020    ECHOCARDIOGRAM REPORT   Patient Name:   JAKHI DISHMAN Date of Exam: 07/15/2020 Medical Rec #:  [id number]      Height:       71.0 in Accession #:    [id number]     Weight:       141.1 lb Date of Birth:  1970/09/03      BSA:          1.818 m Patient Age:    50 years       BP:           116/60 mmHg Patient Gender: M              HR:           69 bpm. Exam Location:  Inpatient Procedure: 2D Echo, Cardiac Doppler and Color Doppler Indications:    Endocarditis  History:        Patient has prior history of Echocardiogram examinations, most                 recent 05/10/2020. TIA; Risk Factors:Hypertension, Diabetes and                 Dyslipidemia.  Sonographer:    Luisa Hart RDCS Referring Phys: [id number] Candee Furbish   Sonographer Comments: Suboptimal parasternal window, suboptimal apical window, suboptimal subcostal window and echo performed with patient supine and on artificial respirator. IMPRESSIONS  1. Left ventricular ejection fraction, by estimation, is 60 to 65%. The left ventricle has normal function. The left ventricle has no regional wall motion abnormalities. There is mild left ventricular hypertrophy. Left ventricular diastolic parameters are indeterminate.  2. Right ventricular systolic function is normal. The right ventricular size is normal. Tricuspid regurgitation signal is inadequate for assessing PA pressure.  3. The mitral valve is normal in structure. Trivial mitral valve regurgitation.  4. The aortic valve was not well visualized. Aortic valve regurgitation is not visualized. No aortic stenosis is present.  5. Aortic dilatation noted. There is dilatation of the ascending aorta, measuring 41 mm. There is dilatation of the aortic root, measuring 40 mm.  6. The inferior vena cava is dilated in size with >50% respiratory variability, suggesting right atrial pressure of 8 mmHg. Conclusion(s)/Recommendation(s): No intracardiac source of embolism detected on this transthoracic study. A transesophageal echocardiogram is recommended to exclude cardiac source of embolism if clinically indicated. FINDINGS  Left Ventricle: Left ventricular ejection fraction, by estimation, is 60 to 65%. The left ventricle has normal function. The left ventricle has no regional wall motion abnormalities. The left ventricular internal cavity size was normal in size. There is  mild left ventricular hypertrophy. Left ventricular diastolic parameters are indeterminate. Right Ventricle: The right ventricular size is normal. No increase in right ventricular wall thickness. Right ventricular systolic function is normal. Tricuspid regurgitation signal is inadequate for assessing PA pressure. Left Atrium: Left atrial size was normal in size. Right  Atrium: Right atrial size was normal in size. Pericardium: There is no evidence of pericardial effusion. Mitral Valve: The mitral valve is normal in structure. Trivial mitral valve regurgitation. Tricuspid Valve: The tricuspid valve is grossly normal. Tricuspid valve regurgitation is not demonstrated. Aortic Valve: The aortic valve was not well visualized. Aortic valve regurgitation is not visualized. No aortic stenosis is present. Aortic valve mean gradient measures 2.0 mmHg. Aortic valve peak gradient measures 3.6 mmHg. Aortic valve area, by VTI measures 5.78 cm. Pulmonic Valve: The pulmonic valve was not well visualized. Pulmonic valve regurgitation not well visualized. Aorta: Aortic dilatation noted. There is dilatation of the ascending aorta, measuring 41 mm. There is dilatation of the aortic root, measuring 40 mm. Venous: The inferior vena cava is dilated in size with greater than 50% respiratory variability, suggesting right atrial pressure of 8 mmHg. IAS/Shunts: No atrial level shunt detected by color flow Doppler.  LEFT VENTRICLE PLAX 2D LVIDd:         4.40 cm     Diastology LVIDs:         2.90 cm     LV e' medial:    4.56 cm/s LV PW:         1.20 cm     LV E/e' medial:  13.2 LV IVS:        0.90 cm     LV e' lateral:   6.05 cm/s LVOT diam:     2.50 cm     LV E/e' lateral: 10.0 LV SV:         88 LV SV Index:   48 LVOT Area:     4.91 cm  LV Volumes (MOD) LV vol d, MOD A2C: 50.3 ml LV vol d, MOD A4C: 54.3 ml LV vol s, MOD A2C: 17.5 ml LV vol s, MOD A4C: 14.1 ml LV SV MOD A2C:     32.8 ml LV SV MOD A4C:     54.3 ml LV SV MOD BP:      36.6 ml RIGHT VENTRICLE RV Basal diam:  2.10 cm    PULMONARY VEINS RV Mid diam:    1.50 cm    A Reversal Duration: 109.00 msec RV S prime:  9.02 cm/s  A Reversal Velocity: 24.80 cm/s TAPSE (M-mode): 1.4 cm     Diastolic Velocity:  56.21 cm/s                            S/D Velocity:        1.00                            Systolic Velocity:   30.86 cm/s LEFT ATRIUM            Index       RIGHT ATRIUM           Index LA diam:      3.00 cm 1.65 cm/m  RA Area:     11.80 cm LA Vol (A2C): 26.4 ml 14.52 ml/m RA Volume:   23.80 ml  13.09 ml/m LA Vol (A4C): 31.0 ml 17.05 ml/m  AORTIC VALVE AV Area (Vmax):    4.66 cm AV Area (Vmean):   4.69 cm AV Area (VTI):     5.78 cm AV Vmax:           95.40 cm/s AV Vmean:          62.200 cm/s AV VTI:            0.152 m AV Peak Grad:      3.6 mmHg AV Mean Grad:      2.0 mmHg LVOT Vmax:         90.50 cm/s LVOT Vmean:        59.400 cm/s LVOT VTI:          0.179 m LVOT/AV VTI ratio: 1.18  AORTA Ao Root diam: 4.10 cm Ao Asc diam:  4.00 cm MITRAL VALVE MV Area (PHT): 3.37 cm    SHUNTS MV Decel Time: 225 msec    Systemic VTI:  0.18 m MV E velocity: 60.40 cm/s  Systemic Diam: 2.50 cm MV A velocity: 60.40 cm/s MV E/A ratio:  1.00 Oswaldo Milian MD Electronically signed by Oswaldo Milian MD Signature Date/Time: 07/15/2020/4:48:00 PM    Final     Medications:  sodium chloride     sodium chloride     sodium chloride     sodium chloride 10 mL/hr at 07/16/20 0600   acyclovir Stopped (07/16/20 0112)   cefTRIAXone (ROCEPHIN)  IV Stopped (07/16/20 0546)   dexmedetomidine (PRECEDEX) IV infusion 1.2 mcg/kg/hr (07/16/20 0736)   feeding supplement (VITAL 1.5 CAL) 1,000 mL (07/16/20 0008)   fentaNYL infusion INTRAVENOUS Stopped (07/15/20 1100)   norepinephrine (LEVOPHED) Adult infusion Stopped (07/15/20 2328)   propofol (DIPRIVAN) infusion Stopped (07/15/20 0716)   vancomycin 750 mg (07/15/20 2244)    aspirin  81 mg Per Tube Daily   atorvastatin  40 mg Per Tube Daily   chlorhexidine gluconate (MEDLINE KIT)  15 mL Mouth Rinse BID   Chlorhexidine Gluconate Cloth  6 each Topical Q0600   docusate  100 mg Per Tube BID   feeding supplement (PROSource TF)  45 mL Per Tube TID   fentaNYL (SUBLIMAZE) injection  50 mcg Intravenous Once   heparin injection (subcutaneous)  5,000 Units Subcutaneous Q8H   insulin aspart  0-15 Units Subcutaneous Q4H    insulin aspart  2 Units Subcutaneous Q4H   mouth rinse  15 mL Mouth Rinse 10 times per day   mupirocin ointment  1 application Nasal BID   pantoprazole sodium  40 mg Per  Tube Daily   polyethylene glycol  17 g Per Tube Daily   sodium chloride flush  10-40 mL Intracatheter Q12H   sodium chloride flush  3 mL Intravenous Q12H    Dialysis Orders: MWF @ [place] 4hr, 400/A1.5, EDW 68kg, 2K/2.5Ca, AVF, UFP #2, no heparin - Hecoral 31mg Iv q HD  Assessment/Plan: # Hypertensive Urgency:  improved however labile.  Pressors started after intubation and then sedation requirement has influenced BP  #  Hyperkalemia: improved with HD. Switched to nepro  # Acute hypoxic resp failure. Intubated 7/7.  Per critical care.    # ESRD: now back on HD per MWF schedule   # Enterococcus bacteremia - abx per primary team.   # Anemia of CKD no ESA needs. monitor trends  # Secondary hyperparathyroidism: hold VDRA for calcium.  Hyperphos - resume home renvela. Switched to nepro for feeds  # T2DM - per primary team  # Hx ICH - head CT without acute abnormality  # Severe agitation: multifactorial. Blood Cx 7/6 with enterococcus.  Had LP on 7/7 with concern for meningitis/encephalitis  # ?Mediastinal mass: CT malignancy cannot be excluded per CT; also with PNA per CT  LClaudia Desanctis MD 07/16/2020, 7:38 AM [facility]

## 2020-07-16 NOTE — Progress Notes (Signed)
RT called to bedside due to pt self extubation. Upon arrival pt on NRB sat 100%. No stridor noted, no resp. Distress. RT placed pt on 4L [place] spo2 100%. MD made aware by RN. RT will continue to monitor.

## 2020-07-16 NOTE — Progress Notes (Signed)
Critical car notified of pt condition. See primary RN note and epic system for further details. Pt in NAD distress. RT at bedside to apply nasal canula. No stridor noted. Oxygen saturations 100 percent.

## 2020-07-16 NOTE — Progress Notes (Signed)
VS at time of self-extubation. CCM aware. Pt given fentanyl IVP 179mg for CPOT score of 5, RASS 2. Precedex continuing at 1.289m. Pt appears calm, resting comfortably after dose above. Will continue to monitor.

## 2020-07-17 DIAGNOSIS — G9340 Encephalopathy, unspecified: Secondary | ICD-10-CM | POA: Diagnosis not present

## 2020-07-17 LAB — BASIC METABOLIC PANEL
Anion gap: 11 (ref 5–15)
BUN: 42 mg/dL — ABNORMAL HIGH (ref 6–20)
CO2: 28 mmol/L (ref 22–32)
Calcium: 10.2 mg/dL (ref 8.9–10.3)
Chloride: 98 mmol/L (ref 98–111)
Creatinine, Ser: 6.52 mg/dL — ABNORMAL HIGH (ref 0.61–1.24)
GFR, Estimated: 10 mL/min — ABNORMAL LOW (ref >60)
Glucose, Bld: 280 mg/dL — ABNORMAL HIGH (ref 70–99)
Potassium: 3.9 mmol/L (ref 3.5–5.1)
Sodium: 137 mmol/L (ref 135–145)

## 2020-07-17 LAB — CBC
HCT: 37.5 % — ABNORMAL LOW (ref 39.0–52.0)
Hemoglobin: 11.8 g/dL — ABNORMAL LOW (ref 13.0–17.0)
MCH: 29.9 pg (ref 26.0–34.0)
MCHC: 31.5 g/dL (ref 30.0–36.0)
MCV: 94.9 fL (ref 80.0–100.0)
Platelets: 97 10*3/uL — ABNORMAL LOW (ref 150–400)
RBC: 3.95 MIL/uL — ABNORMAL LOW (ref 4.22–5.81)
RDW: 16.1 % — ABNORMAL HIGH (ref 11.5–15.5)
WBC: 7.7 10*3/uL (ref 4.0–10.5)
nRBC: 0 % (ref 0.0–0.2)

## 2020-07-17 LAB — GLUCOSE, CAPILLARY
Glucose-Capillary: 231 mg/dL — ABNORMAL HIGH (ref 70–99)
Glucose-Capillary: 207 mg/dL — ABNORMAL HIGH (ref 70–99)
Glucose-Capillary: 269 mg/dL — ABNORMAL HIGH (ref 70–99)
Glucose-Capillary: 290 mg/dL — ABNORMAL HIGH (ref 70–99)
Glucose-Capillary: 237 mg/dL — ABNORMAL HIGH (ref 70–99)
Glucose-Capillary: 226 mg/dL — ABNORMAL HIGH (ref 70–99)

## 2020-07-17 MED ORDER — SEVELAMER CARBONATE 800 MG PO TABS
1600.0000 mg | ORAL_TABLET | Freq: Three times a day (TID) | ORAL | Status: DC
  Administered 2020-07-17 – 2020-07-18 (×4): 1600 mg
  Filled 2020-07-17 (×4): qty 2

## 2020-07-17 MED ORDER — CARVEDILOL 6.25 MG PO TABS
6.2500 mg | ORAL_TABLET | Freq: Two times a day (BID) | ORAL | Status: DC
  Administered 2020-07-17: 6.25 mg via ORAL
  Filled 2020-07-17: qty 1

## 2020-07-17 MED ORDER — CHLORHEXIDINE GLUCONATE CLOTH 2 % EX PADS: 6.0000 | MEDICATED_PAD | Freq: Every day | CUTANEOUS | Status: DC

## 2020-07-17 MED ORDER — INSULIN GLARGINE 100 UNIT/ML ~~LOC~~ SOLN
10.0000 [IU] | Freq: Every day | SUBCUTANEOUS | Status: DC
  Administered 2020-07-17 – 2020-07-28 (×12): 10 [IU] via SUBCUTANEOUS
  Filled 2020-07-17 (×12): qty 0.1

## 2020-07-17 MED ORDER — CARVEDILOL 6.25 MG PO TABS
6.2500 mg | ORAL_TABLET | Freq: Two times a day (BID) | ORAL | Status: DC
  Administered 2020-07-17 – 2020-07-24 (×9): 6.25 mg
  Filled 2020-07-17 (×11): qty 1

## 2020-07-17 MED ORDER — SODIUM CHLORIDE 0.9 % IV SOLN
2.0000 g | INTRAVENOUS | Status: DC
  Administered 2020-07-18: 2 g via INTRAVENOUS
  Filled 2020-07-17: qty 20

## 2020-07-17 NOTE — Progress Notes (Signed)
[facility] Progress Note   Subjective:  last HD 7/8 with 0.9 kg UF (with goal of 2 kg).  Note patient self-extubated on 7/9.  He has been altered and is not able to provide hx. He is on room air with sats 99-100% on my exam.   Review of systems:  Unable to obtain 2/2 AMS   Objective Vitals:   07/17/20 0340 07/17/20 0400 07/17/20 0500 07/17/20 0600  BP:  (!) 157/82 (!) 158/77 133/70  Pulse:  77 75 73  Resp:  '15 16 13  '$ Temp:      TempSrc:      SpO2:  100% 99% 99%  Weight: 63.7 kg     Height:       Physical Exam  General adult male in bed  HEENT normocephalic atraumatic  Lungs clear to auscultation and unlabored at rest on room air Heart S1S2 no rub Abdomen soft nontender with limit of sedation nondistended Extremities no edema; bilateral BKA Neuro - sedation is running - does not answer questions or follow commands; speaks once but not able to discern  Psych agitation with exam  Access RUE AVF bruit and thrill   Additional Objective Labs: Basic Metabolic Panel: Recent Labs  Lab 07/14/20 1822 07/15/20 0352 07/15/20 1603 07/16/20 0317 07/17/20 0336  NA 136 136  --  134* 137  K 3.9 4.3  --  3.6 3.9  CL 97* 97*  --  94* 98  CO2 27 25  --  27 28  GLUCOSE 69* 169*  --  209* 280*  BUN 26* 32*  --  17 42*  CREATININE 6.20* 6.84*  --  4.40* 6.52*  CALCIUM 9.9 9.9  --  9.3 10.2  PHOS 5.9* 6.4* 7.2*  --   --    Liver Function Tests: Recent Labs  Lab 07/13/20 1549 07/13/20 2148 07/14/20 1008 07/14/20 1822  AST 20  --  18 20  ALT 15  --  13 13  ALKPHOS 88  --  75 71  BILITOT 1.0  --  1.4* 0.8  PROT 7.7  --  7.2 7.0  ALBUMIN 3.8 3.8 3.5 3.4*   CBC: Recent Labs  Lab 07/13/20 1549 07/13/20 1639 07/14/20 1008 07/14/20 1112 07/14/20 1822 07/15/20 0352 07/16/20 0317 07/17/20 0336  WBC 6.9   < > 9.2  --  10.9* 9.5 7.3 7.7  NEUTROABS 4.1  --  5.8  --  5.3  --   --   --   HGB 11.9*   < > 10.8*   < > 11.2* 10.2* 11.4* 11.8*  HCT 38.5*   < >  33.8*   < > 35.1* 33.4* 36.1* 37.5*  MCV 96.7   < > 94.7  --  95.4 96.3 95.3 94.9  PLT 80*   < > 82*  --  104* 94* 82* 97*   < > = values in this interval not displayed.   Blood Culture    Component Value Date/Time   SDES CSF 07/14/2020 1732   SPECREQUEST NONE 07/14/2020 1732   CULT  07/14/2020 1732    NO GROWTH 2 DAYS Performed at [facility], [place] [address], [place], [place] [address]    REPTSTATUS PENDING 07/14/2020 1732   CBG: Recent Labs  Lab 07/16/20 1119 07/16/20 1544 07/16/20 1924 07/16/20 2325 07/17/20 0310  GLUCAP 242* 259* 293* 221* 231*   Studies/Results: DG Abd Portable 1V  Result Date: 07/15/2020 CLINICAL DATA:  Encounter for feeding tube placement. EXAM:  PORTABLE ABDOMEN - 1 VIEW COMPARISON:  Chest CT of July 14, 2020. FINDINGS: Lung bases are clear. Feeding tube terminates in the antro pyloric stomach. No abnormally dilated loops of bowel in the upper abdomen. Signs of vascular calcifications along splenic artery distribution. On limited assessment no acute regional skeletal process. IMPRESSION: Feeding tube terminates in the antro pyloric stomach. These results will be called to the ordering clinician or representative by the Radiologist Assistant, and communication documented in the PACS or [employer]. Electronically Signed   By: Zetta Bills M.D.   On: 07/15/2020 13:16   EEG adult  Result Date: 07/15/2020 Lora Havens, MD     07/15/2020  9:00 AM Patient Name: Jorge [name] MRN: [id number] Epilepsy Attending: Lora Havens Referring Physician/Provider: Dr Ina Homes Date: 07/14/2020 Duration: 25.37 mins Patient history: 50 year old male with altered mental status.  EEG evaluate for seizures. Level of alertness: comatose AEDs during EEG study: Propofol Technical aspects: This EEG study was done with scalp electrodes positioned according to the 10-20 International system of electrode placement. Electrical activity was acquired at a sampling rate of  '500Hz'$  and reviewed with a high frequency filter of '70Hz'$  and a low frequency filter of '1Hz'$ . EEG data were recorded continuously and digitally stored. Description: EEG showed continuous generalized 3 to 5 Hz theta and delta slowing admixed with 15 to 18 Hz frontocentral beta activity.  Hyperventilation and photic stimulation were not performed.   ABNORMALITY - Continuous slow, generalized IMPRESSION: This study is suggestive of severe diffuse encephalopathy, nonspecific etiology but likely related to sedation. No seizures or definite epileptiform discharges were seen throughout the recording. Lora Havens   ECHOCARDIOGRAM COMPLETE  Result Date: 07/15/2020    ECHOCARDIOGRAM REPORT   Patient Name:   Jorge [name] Date of Exam: 07/15/2020 Medical Rec #:  [id number]      Height:       71.0 in Accession #:    [id number]     Weight:       141.1 lb Date of Birth:  10-31-1970      BSA:          1.818 m Patient Age:    50 years       BP:           116/60 mmHg Patient Gender: M              HR:           69 bpm. Exam Location:  Inpatient Procedure: 2D Echo, Cardiac Doppler and Color Doppler Indications:    Endocarditis  History:        Patient has prior history of Echocardiogram examinations, most                 recent 05/10/2020. TIA; Risk Factors:Hypertension, Diabetes and                 Dyslipidemia.  Sonographer:    Luisa Hart RDCS Referring Phys: [id number] Candee Furbish  Sonographer Comments: Suboptimal parasternal window, suboptimal apical window, suboptimal subcostal window and echo performed with patient supine and on artificial respirator. IMPRESSIONS  1. Left ventricular ejection fraction, by estimation, is 60 to 65%. The left ventricle has normal function. The left ventricle has no regional wall motion abnormalities. There is mild left ventricular hypertrophy. Left ventricular diastolic parameters are indeterminate.  2. Right ventricular systolic function is normal. The right ventricular size is normal.  Tricuspid regurgitation signal is inadequate for assessing PA pressure.  3. The mitral valve is normal in structure. Trivial mitral valve regurgitation.  4. The aortic valve was not well visualized. Aortic valve regurgitation is not visualized. No aortic stenosis is present.  5. Aortic dilatation noted. There is dilatation of the ascending aorta, measuring 41 mm. There is dilatation of the aortic root, measuring 40 mm.  6. The inferior vena cava is dilated in size with >50% respiratory variability, suggesting right atrial pressure of 8 mmHg. Conclusion(s)/Recommendation(s): No intracardiac source of embolism detected on this transthoracic study. A transesophageal echocardiogram is recommended to exclude cardiac source of embolism if clinically indicated. FINDINGS  Left Ventricle: Left ventricular ejection fraction, by estimation, is 60 to 65%. The left ventricle has normal function. The left ventricle has no regional wall motion abnormalities. The left ventricular internal cavity size was normal in size. There is  mild left ventricular hypertrophy. Left ventricular diastolic parameters are indeterminate. Right Ventricle: The right ventricular size is normal. No increase in right ventricular wall thickness. Right ventricular systolic function is normal. Tricuspid regurgitation signal is inadequate for assessing PA pressure. Left Atrium: Left atrial size was normal in size. Right Atrium: Right atrial size was normal in size. Pericardium: There is no evidence of pericardial effusion. Mitral Valve: The mitral valve is normal in structure. Trivial mitral valve regurgitation. Tricuspid Valve: The tricuspid valve is grossly normal. Tricuspid valve regurgitation is not demonstrated. Aortic Valve: The aortic valve was not well visualized. Aortic valve regurgitation is not visualized. No aortic stenosis is present. Aortic valve mean gradient measures 2.0 mmHg. Aortic valve peak gradient measures 3.6 mmHg. Aortic valve area,  by VTI measures 5.78 cm. Pulmonic Valve: The pulmonic valve was not well visualized. Pulmonic valve regurgitation not well visualized. Aorta: Aortic dilatation noted. There is dilatation of the ascending aorta, measuring 41 mm. There is dilatation of the aortic root, measuring 40 mm. Venous: The inferior vena cava is dilated in size with greater than 50% respiratory variability, suggesting right atrial pressure of 8 mmHg. IAS/Shunts: No atrial level shunt detected by color flow Doppler.  LEFT VENTRICLE PLAX 2D LVIDd:         4.40 cm     Diastology LVIDs:         2.90 cm     LV e' medial:    4.56 cm/s LV PW:         1.20 cm     LV E/e' medial:  13.2 LV IVS:        0.90 cm     LV e' lateral:   6.05 cm/s LVOT diam:     2.50 cm     LV E/e' lateral: 10.0 LV SV:         88 LV SV Index:   48 LVOT Area:     4.91 cm  LV Volumes (MOD) LV vol d, MOD A2C: 50.3 ml LV vol d, MOD A4C: 54.3 ml LV vol s, MOD A2C: 17.5 ml LV vol s, MOD A4C: 14.1 ml LV SV MOD A2C:     32.8 ml LV SV MOD A4C:     54.3 ml LV SV MOD BP:      36.6 ml RIGHT VENTRICLE RV Basal diam:  2.10 cm    PULMONARY VEINS RV Mid diam:    1.50 cm    A Reversal Duration: 109.00 msec RV S prime:     9.02 cm/s  A Reversal Velocity: 24.80 cm/s TAPSE (M-mode): 1.4 cm     Diastolic Velocity:  Q000111Q cm/s  S/D Velocity:        1.00                            Systolic Velocity:   123456 cm/s LEFT ATRIUM           Index       RIGHT ATRIUM           Index LA diam:      3.00 cm 1.65 cm/m  RA Area:     11.80 cm LA Vol (A2C): 26.4 ml 14.52 ml/m RA Volume:   23.80 ml  13.09 ml/m LA Vol (A4C): 31.0 ml 17.05 ml/m  AORTIC VALVE AV Area (Vmax):    4.66 cm AV Area (Vmean):   4.69 cm AV Area (VTI):     5.78 cm AV Vmax:           95.40 cm/s AV Vmean:          62.200 cm/s AV VTI:            0.152 m AV Peak Grad:      3.6 mmHg AV Mean Grad:      2.0 mmHg LVOT Vmax:         90.50 cm/s LVOT Vmean:        59.400 cm/s LVOT VTI:          0.179 m LVOT/AV VTI ratio:  1.18  AORTA Ao Root diam: 4.10 cm Ao Asc diam:  4.00 cm MITRAL VALVE MV Area (PHT): 3.37 cm    SHUNTS MV Decel Time: 225 msec    Systemic VTI:  0.18 m MV E velocity: 60.40 cm/s  Systemic Diam: 2.50 cm MV A velocity: 60.40 cm/s MV E/A ratio:  1.00 Oswaldo Milian MD Electronically signed by Oswaldo Milian MD Signature Date/Time: 07/15/2020/4:48:00 PM    Final     Medications:  sodium chloride     sodium chloride 10 mL/hr at 07/17/20 0600   acyclovir Stopped (07/17/20 0050)   cefTRIAXone (ROCEPHIN)  IV Stopped (07/17/20 0550)   dexmedetomidine (PRECEDEX) IV infusion 1.2 mcg/kg/hr (07/17/20 [id number])   feeding supplement (NEPRO CARB STEADY) 1,000 mL (07/17/20 0500)   fentaNYL infusion INTRAVENOUS Stopped (07/15/20 1100)   norepinephrine (LEVOPHED) Adult infusion Stopped (07/15/20 2328)   propofol (DIPRIVAN) infusion Stopped (07/15/20 0716)   vancomycin 750 mg (07/15/20 2244)    aspirin  81 mg Per Tube Daily   atorvastatin  40 mg Per Tube Daily   Chlorhexidine Gluconate Cloth  6 each Topical Q1200   docusate  100 mg Per Tube BID   feeding supplement (PROSource TF)  45 mL Per Tube TID   fentaNYL (SUBLIMAZE) injection  50 mcg Intravenous Once   heparin injection (subcutaneous)  5,000 Units Subcutaneous Q8H   insulin aspart  0-15 Units Subcutaneous Q4H   insulin aspart  2 Units Subcutaneous Q4H   mouth rinse  15 mL Mouth Rinse BID   mupirocin ointment  1 application Nasal BID   pantoprazole sodium  40 mg Per Tube Daily   polyethylene glycol  17 g Per Tube Daily   sevelamer carbonate  1,600 mg Oral TID WC   sodium chloride flush  10-40 mL Intracatheter Q12H    Dialysis Orders: MWF @ [place] 4hr, 400/A1.5, EDW 68kg, 2K/2.5Ca, AVF, UFP #2, no heparin - Hecoral 29mg Iv q HD  Assessment/Plan: # Hypertensive Urgency:  labile.  Pressors started after intubation and then sedation requirement has influenced BP.  Has since self-extubated.  Home meds reported per [facility] as coreg 12.5 mg BID and  clonidine nightly.  Discussed with RN.  He has been persistently high for her - 123456 systolic on monitor (Q000111Q is outlier) and 188/88 on my exam.  Start back coreg at reduced dose of 6.25 mg BID  #  Hyperkalemia: improved with HD. Switched to nepro  # Acute hypoxic resp failure. Intubated 7/7.  Then self-extubated on 7/9.  Per critical care.   # ESRD:  HD per MWF schedule   # Enterococcus bacteremia - abx per primary team. Note on vanc and ceftriaxone  # Anemia of CKD no ESA needs. monitor trends  # Secondary hyperparathyroidism: hold VDRA for calcium for now.  Hyperphos - resumed home renvela. Switched to nepro for feeds  # T2DM - per primary team  # Hx ICH - head CT without acute abnormality  # Severe agitation: multifactorial. Blood Cx 7/6 with enterococcus.  Had LP on 7/7 with possible meningitis/encephalitis. Abx as above and on acyclovir - per critical care  # ?Mediastinal mass: CT malignancy cannot be excluded per CT; also with PNA per CT  Claudia Desanctis, MD 07/17/2020,7:50 AM [facility]

## 2020-07-17 NOTE — Progress Notes (Signed)
NAME:  Jorge [name], MRN:  [id number], DOB:  August 05, 1970, LOS: 4 ADMISSION DATE:  07/13/2020, CONSULTATION DATE:  07/13/20 REFERRING MD:  Rogers Blocker CHIEF COMPLAINT:  AMS, HTN   History of Present Illness:  Jorge [name] is a 50 y.o. male who has a PMH including but not limited to ESRD on HD MWF, bilateral BKA, DM, ICH, TIA, HTN, HLD.  He was admitted 07/13/2020 with AMS and volume overload.  He had apparently been at his regular dialysis session and had gotten roughly 1 hour of treatment before he began to pull at lines and become confused.  He was taken off dialysis and was sent to the ED.  In the ED, he was confused and blood pressure was initially normal; however, after several hours, he became hypoxic, progressively more hypotensive into the 123456 systolic, and had worsening AMS.  He was admitted by [facility] with plans for dialysis overnight.  He received IV hydralazine and labetalol with minimal improvement in blood pressure.  PCCM subsequently called as it was felt that he would need continuous infusion.  He also had agitation and was not fully cooperative with nursing staff and required additional dose of Haldol. He has been seen by nephrology who plans for emergent dialysis tonight.  Pertinent  Medical History:  has ESRD (end stage renal disease) on dialysis ([place]); Hypertension associated with diabetes ([place]); Type 2 diabetes mellitus with chronic kidney disease on chronic dialysis, with long-term current use of insulin ([place]); ESRD on peritoneal dialysis ([place]); S/P BKA (below knee amputation) bilateral ([place]); Labile blood glucose; Anemia of chronic disease; Gastroparesis due to DM ([place]); Aluminum bone disease; Encounter for adequacy testing for peritoneal dialysis [facility]); Other disorders of bilirubin metabolism; Other disorders resulting from impaired renal tubular function; Coagulation defect, unspecified ([place]); Personal history of anaphylaxis; Secondary hyperparathyroidism of renal origin ([place]); Acute  encephalopathy; TIA (transient ischemic attack); Stupor; Hyperlipidemia associated with type 2 diabetes mellitus ([place]); AMS (altered mental status); Hypertensive urgency; and Malnutrition of moderate degree on their problem list.  Significant Hospital Events: Including procedures, antibiotic start and stop dates in addition to other pertinent events   7/6 admit. 7/7 intubation/CVL/LP/MRI/EEG 7/9 self extubated   Interim History / Subjective:   Self extubated yesterday. Doing well on room air. Was left on precedex overnight. But he is following commands this morning intermittently on 1 of precedex.   Objective:  Blood pressure 126/70, pulse 78, temperature 98.2 F (36.8 C), temperature source Axillary, resp. rate 15, height '5\' 11"'$  (1.803 m), weight 63.7 kg, SpO2 100 %.    Vent Mode: PRVC FiO2 (%):  [40 %] 40 % Set Rate:  [15 bmp] 15 bmp Vt Set:  [600 mL] 600 mL PEEP:  [5 cmH20] 5 cmH20 Plateau Pressure:  [13 cmH20] 13 cmH20   Intake/Output Summary (Last 24 hours) at 07/17/2020 0949 Last data filed at 07/17/2020 0600 Gross per 24 hour  Intake 2428.47 ml  Output --  Net 2428.47 ml   Filed Weights   07/15/20 0500 07/16/20 0500 07/17/20 0340  Weight: 64 kg 64.4 kg 63.7 kg    Examination: Constitutional: middle age male, appears older than stated age  Eyes: pupils reactive HENT: opens eyes to stimulation, attempts to track quickly falls asleep again  Cardiovascular: RRR s1 s2  Respiratory: BL breath sounds, no crackles no wheeze  Gastrointestinal: soft nt nd  Skin: no rash  Neurologic: with draws to pain, opens eyes with stimulation  Psychiatric: RASS -1    Labs/imaging personally reviewed:  CT head 7/6  > no acute process. EEG - no seizure  ECHO - no veg, normal EF   Sodium 137 WBC 7.7 K 3.9  CSF studies reviewed   Resolved Hospital Problem list:    Assessment & Plan:   Persistent severe acute metabolic encephalopathy, present on admission  MRI without clear  cause has a bunch of small infarcts, LP equivocal, (Elevated WBC but also has high RBC count), Ammonia normal.  TSH normal.  No improvement with HD. HSV PCR negative, I doubt meningitis at this point. EEG with no seizures  - overall, seems to be slowly improving  Multiple tiny bilateral CVAs - suspect related to small vessel disease and BP swings with HD. TTE neg for veg. Normal EF Severe hyperkalemia post emergent HD, resolved  Bp now stable  DM with hyperglycemia now hypoglycemia off insulin- borderline gap, borderline beta hydroxybutyrate - resolved  Staph species 1/4 BC on admit- suspect contamination, would not use to guide therapy RLL aspiration pneumonia, present on admission, adenopathy, enlarged paratracheal adenopathy  ESRD on iHD  Clogged cortrac, plans in place to get replaced monday Prolonged qtc  Plan:  iHD per nephrology  Stop acyclovir  Stop vancomycin  Complete course of ceftriaxone  Needs a repeat CT Chest in 4 - 6 weeks to follow up on enlarged paratracheal adenopathy. This likely represents inflammatory response to the lower lobe pneumonia but if it doesn't go away would be concerned for possible underlying malignancy. I am happy to see him in clinic for follow up regarding this.   Stable for transfer to [facility] for pick up tomorrow.   Best practice (evaluated daily):  Diet/type: TF DVT prophylaxis: heparin GI prophylaxis: ppi Lines: yes Foley:  condom (makes small amount of urine) Code Status:  full code Last date of multidisciplinary goals of care discussion: continue aggressive care, 7/7   Garner Nash, DO [facility] 07/17/2020 9:49 AM

## 2020-07-17 NOTE — Progress Notes (Signed)
Hydralazine prn given

## 2020-07-18 ENCOUNTER — Inpatient Hospital Stay (HOSPITAL_COMMUNITY): Payer: Medicare Other

## 2020-07-18 DIAGNOSIS — E1122 Type 2 diabetes mellitus with diabetic chronic kidney disease: Secondary | ICD-10-CM

## 2020-07-18 DIAGNOSIS — G9341 Metabolic encephalopathy: Secondary | ICD-10-CM | POA: Diagnosis not present

## 2020-07-18 DIAGNOSIS — N186 End stage renal disease: Secondary | ICD-10-CM | POA: Diagnosis not present

## 2020-07-18 DIAGNOSIS — R410 Disorientation, unspecified: Secondary | ICD-10-CM | POA: Diagnosis not present

## 2020-07-18 DIAGNOSIS — Z794 Long term (current) use of insulin: Secondary | ICD-10-CM

## 2020-07-18 DIAGNOSIS — I6389 Other cerebral infarction: Secondary | ICD-10-CM

## 2020-07-18 DIAGNOSIS — D638 Anemia in other chronic diseases classified elsewhere: Secondary | ICD-10-CM | POA: Diagnosis not present

## 2020-07-18 DIAGNOSIS — G9340 Encephalopathy, unspecified: Secondary | ICD-10-CM | POA: Diagnosis not present

## 2020-07-18 DIAGNOSIS — R401 Stupor: Secondary | ICD-10-CM | POA: Diagnosis not present

## 2020-07-18 DIAGNOSIS — E1169 Type 2 diabetes mellitus with other specified complication: Secondary | ICD-10-CM | POA: Diagnosis not present

## 2020-07-18 DIAGNOSIS — E440 Moderate protein-calorie malnutrition: Secondary | ICD-10-CM

## 2020-07-18 LAB — POCT I-STAT 7, (LYTES, BLD GAS, ICA,H+H)
Acid-Base Excess: 6 mmol/L — ABNORMAL HIGH (ref 0.0–2.0)
Bicarbonate: 29.3 mmol/L — ABNORMAL HIGH (ref 20.0–28.0)
Calcium, Ion: 1.36 mmol/L (ref 1.15–1.40)
HCT: 38 % — ABNORMAL LOW (ref 39.0–52.0)
Hemoglobin: 12.9 g/dL — ABNORMAL LOW (ref 13.0–17.0)
O2 Saturation: 100 %
Patient temperature: 95
Potassium: 3.9 mmol/L (ref 3.5–5.1)
Sodium: 138 mmol/L (ref 135–145)
TCO2: 30 mmol/L (ref 22–32)
pCO2 arterial: 35.3 mmHg (ref 32.0–48.0)
pH, Arterial: 7.519 — ABNORMAL HIGH (ref 7.350–7.450)
pO2, Arterial: 377 mmHg — ABNORMAL HIGH (ref 83.0–108.0)

## 2020-07-18 LAB — GLUCOSE, CAPILLARY
Glucose-Capillary: 299 mg/dL — ABNORMAL HIGH (ref 70–99)
Glucose-Capillary: 279 mg/dL — ABNORMAL HIGH (ref 70–99)
Glucose-Capillary: 312 mg/dL — ABNORMAL HIGH (ref 70–99)
Glucose-Capillary: 283 mg/dL — ABNORMAL HIGH (ref 70–99)
Glucose-Capillary: 268 mg/dL — ABNORMAL HIGH (ref 70–99)
Glucose-Capillary: 157 mg/dL — ABNORMAL HIGH (ref 70–99)
Glucose-Capillary: 118 mg/dL — ABNORMAL HIGH (ref 70–99)

## 2020-07-18 LAB — BASIC METABOLIC PANEL
Anion gap: 20 — ABNORMAL HIGH (ref 5–15)
BUN: 65 mg/dL — ABNORMAL HIGH (ref 6–20)
CO2: 22 mmol/L (ref 22–32)
Calcium: 10.9 mg/dL — ABNORMAL HIGH (ref 8.9–10.3)
Chloride: 96 mmol/L — ABNORMAL LOW (ref 98–111)
Creatinine, Ser: 8.06 mg/dL — ABNORMAL HIGH (ref 0.61–1.24)
GFR, Estimated: 7 mL/min — ABNORMAL LOW (ref >60)
Glucose, Bld: 310 mg/dL — ABNORMAL HIGH (ref 70–99)
Potassium: 4.1 mmol/L (ref 3.5–5.1)
Sodium: 138 mmol/L (ref 135–145)

## 2020-07-18 LAB — CSF CULTURE W GRAM STAIN: Culture: NO GROWTH

## 2020-07-18 LAB — CULTURE, BLOOD (ROUTINE X 2): Culture: NO GROWTH

## 2020-07-18 LAB — CBC
HCT: 38.1 % — ABNORMAL LOW (ref 39.0–52.0)
Hemoglobin: 12.4 g/dL — ABNORMAL LOW (ref 13.0–17.0)
MCH: 30.2 pg (ref 26.0–34.0)
MCHC: 32.5 g/dL (ref 30.0–36.0)
MCV: 92.7 fL (ref 80.0–100.0)
Platelets: 127 10*3/uL — ABNORMAL LOW (ref 150–400)
RBC: 4.11 MIL/uL — ABNORMAL LOW (ref 4.22–5.81)
RDW: 16.2 % — ABNORMAL HIGH (ref 11.5–15.5)
WBC: 7.4 10*3/uL (ref 4.0–10.5)
nRBC: 0 % (ref 0.0–0.2)

## 2020-07-18 LAB — PHOSPHORUS: Phosphorus: 7.1 mg/dL — ABNORMAL HIGH (ref 2.5–4.6)

## 2020-07-18 LAB — BETA-HYDROXYBUTYRIC ACID: Beta-Hydroxybutyric Acid: 0.4 mmol/L — ABNORMAL HIGH (ref 0.05–0.27)

## 2020-07-18 IMAGING — CT CT HEAD CODE STROKE
3 series · 14 of 47 positions shown, 16 images · non-contrast
Comparison: Brain MRI [DATE].  Head CT [DATE].

CLINICAL DATA: Code stroke.  Stroke, follow-up.

EXAM:
CT HEAD WITHOUT CONTRAST
TECHNIQUE: Contiguous axial images were obtained from the base of the skull
through the vertex without intravenous contrast.

[Series 2: head 5.0 h30s · axial · 0.47mm/px · z∈[-60,+85]mm · 8 of 35 slices shown, 10 images]
[im 3/35  brain]
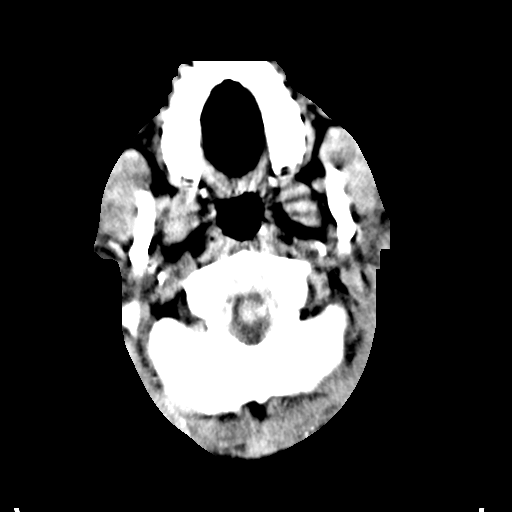
[im 3/35  bone]
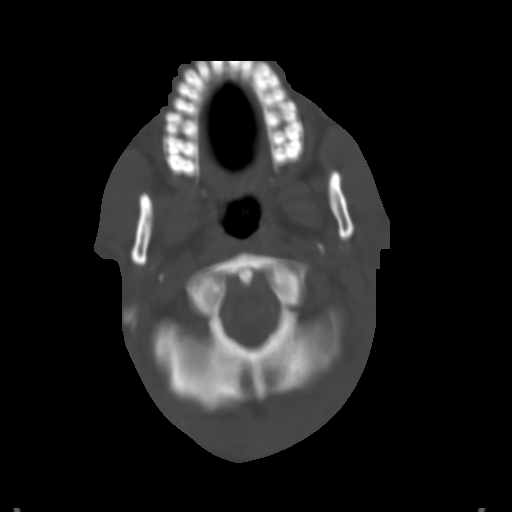
[im 8/35  brain]
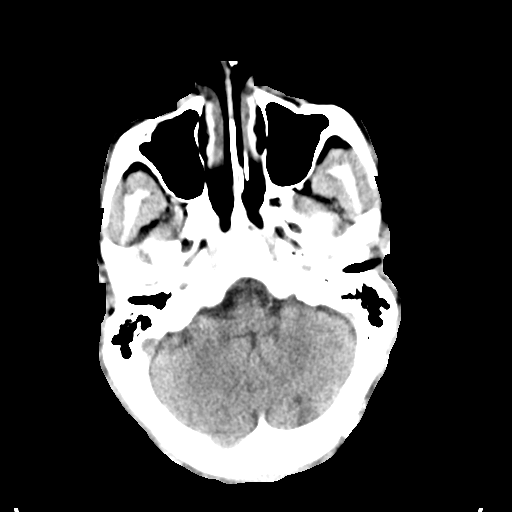
[im 11/35  brain]
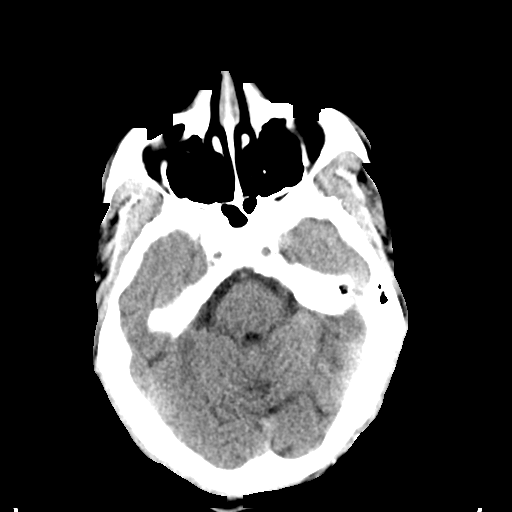
[im 16/35  brain]
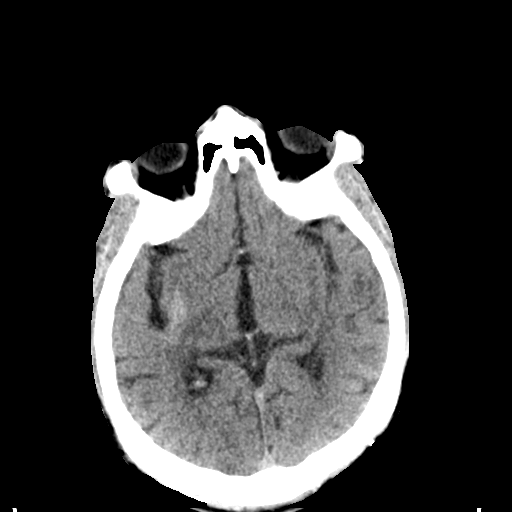
[im 19/35  brain]
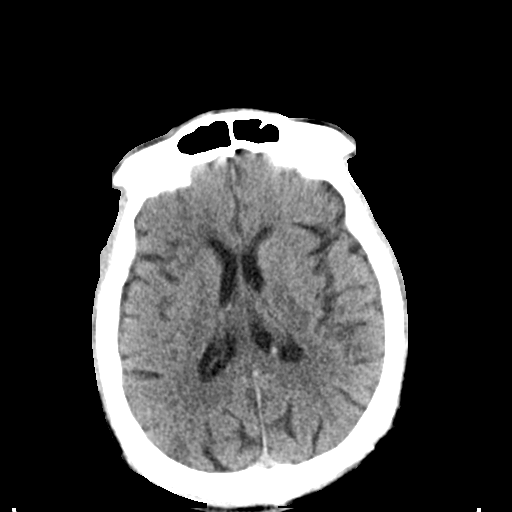
[im 19/35  bone]
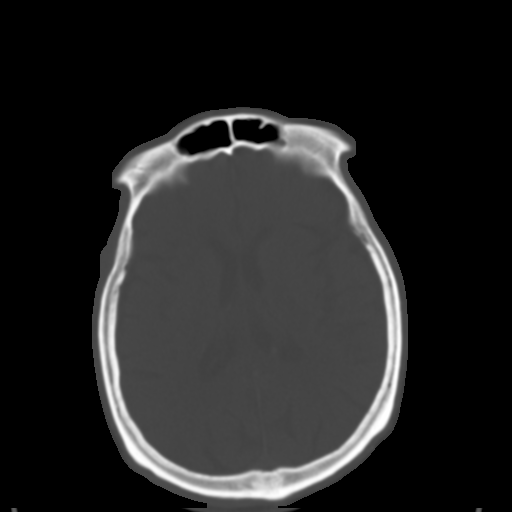
[im 24/35  brain]
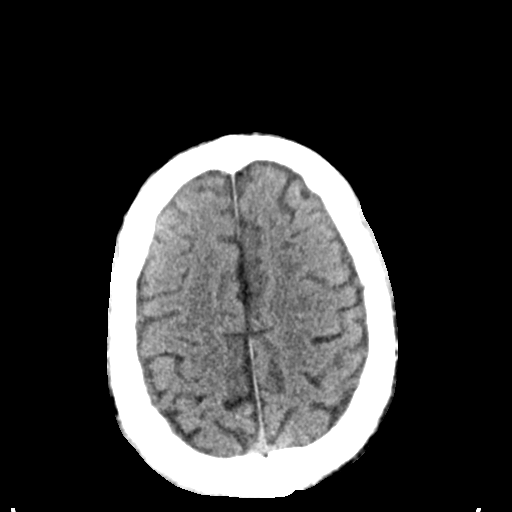
[im 27/35  brain]
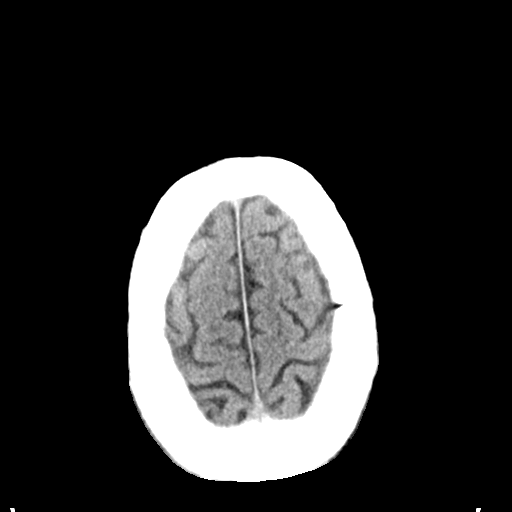
[im 32/35  brain]
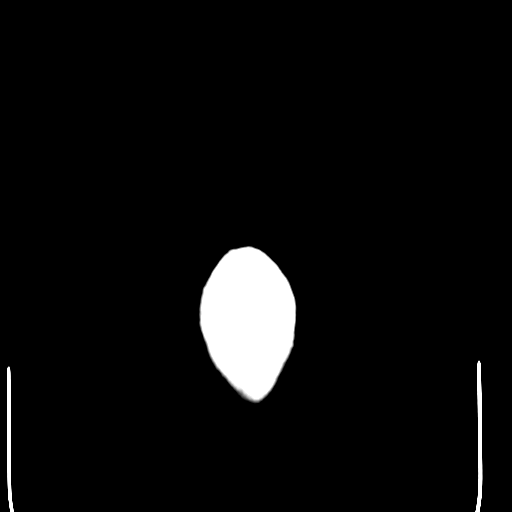

[Series 5: head 3.0 mpr cor · coronal · 0.34mm/px · 3 of 82 slices shown]
[im 28/82  brain]
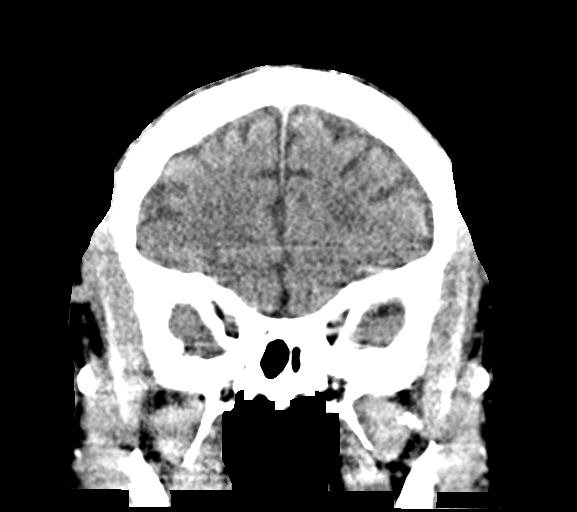
[im 37/82  brain]
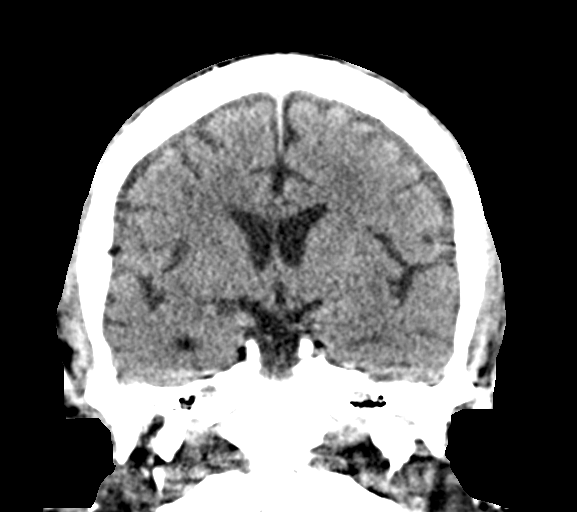
[im 46/82  brain]
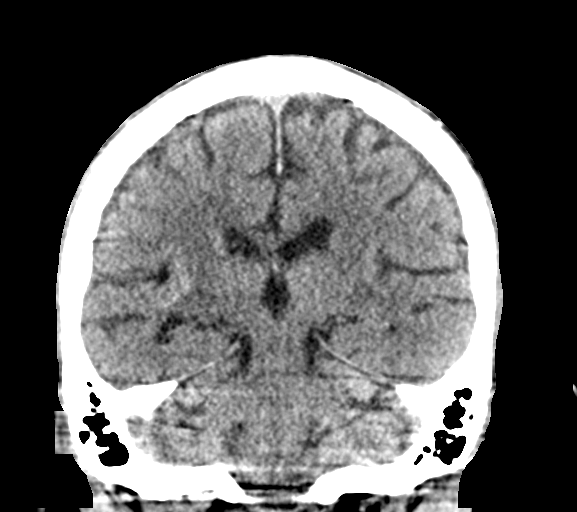

[Series 6: head 3.0 mpr sag · sagittal · 0.34mm/px · 3 of 67 slices shown]
[im 23/67  brain]
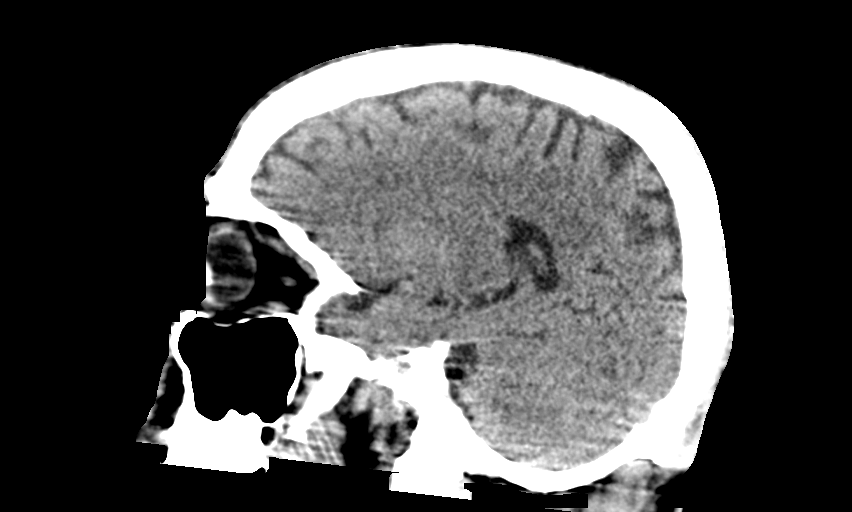
[im 34/67  brain]
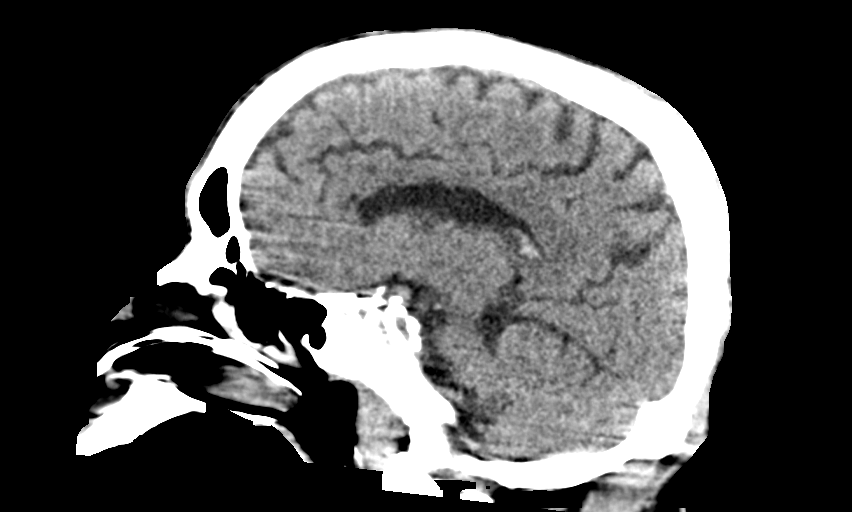
[im 45/67  brain]
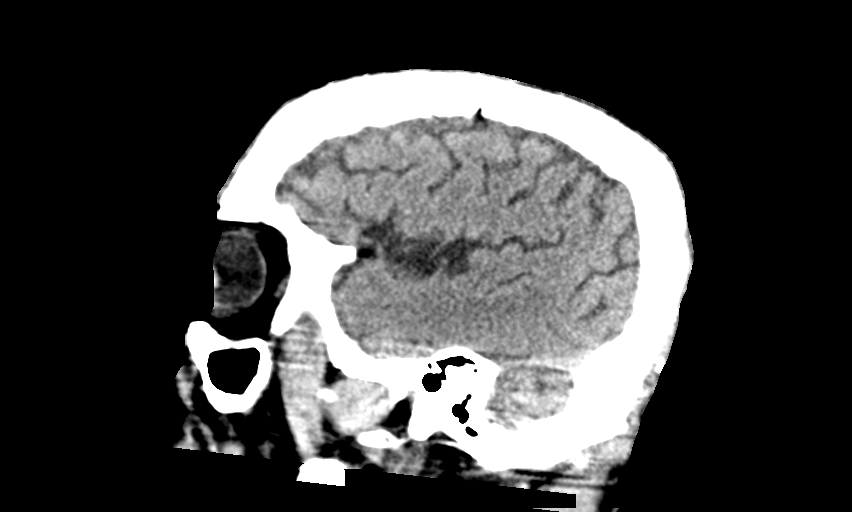

[14 of 47 positions shown; findings below may reference images not displayed]

FINDINGS: Brain:

Mild generalized cerebral and cerebellar atrophy.

Four known small subacute supratentorial and infratentorial infarcts
were better appreciated on the brain MRI of [DATE].

Redemonstrated small chronic cortical/subcortical infarct within the
anterior left frontal lobe.

Moderate patchy and ill-defined hypoattenuation within the cerebral
white matter, nonspecific but compatible with chronic small vessel
ischemic disease.

Known chronic lacunar infarcts within the bilateral thalami.

Redemonstrated hyperdensity within the right external capsule
representing chronic blood products from prior hemorrhagic infarct
at this site.

Multiple known small chronic infarcts within the bilateral
cerebellar hemispheres.

There is no acute intracranial hemorrhage.

No interval acute demarcated cortical infarct.

No extra-axial fluid collection.

No evidence of an intracranial mass.

No midline shift.

Vascular: No hyperdense vessel.  Atherosclerotic calcifications.

Skull: Normal. Negative for fracture or focal lesion.

Sinuses/Orbits: Leftward gaze. Partially imaged nasoenteric tube.
Mild mucosal thickening within right sphenoid sinus.

ASPECTS (Alberta Stroke Program Early CT Score)

- Ganglionic level infarction (caudate, lentiform nuclei, internal
capsule, insula, M1-M3 cortex): 7

- Supraganglionic infarction (M4-M6 cortex): 3

Total score (0-10 with 10 being normal): 10 (when discounting known
subacute and chronic infarcts).

These results were communicated to Dr. BILLIE At [DATE] BILLIE
[DATE]by text page via the [employer] messaging system.
IMPRESSION: No evidence of interval acute intracranial abnormality.

Four known small supratentorial and infratentorial subacute infarcts
were better appreciated on the brain MRI of [DATE].

Redemonstrated chronic/stable findings, as described.

## 2020-07-18 IMAGING — DX DG CHEST 1V PORT
1 series · 1 of 1 positions shown · non-contrast
Comparison: [DATE] chest radiograph.

CLINICAL DATA: Pneumonia

EXAM:
PORTABLE CHEST 1 VIEW

[chest]
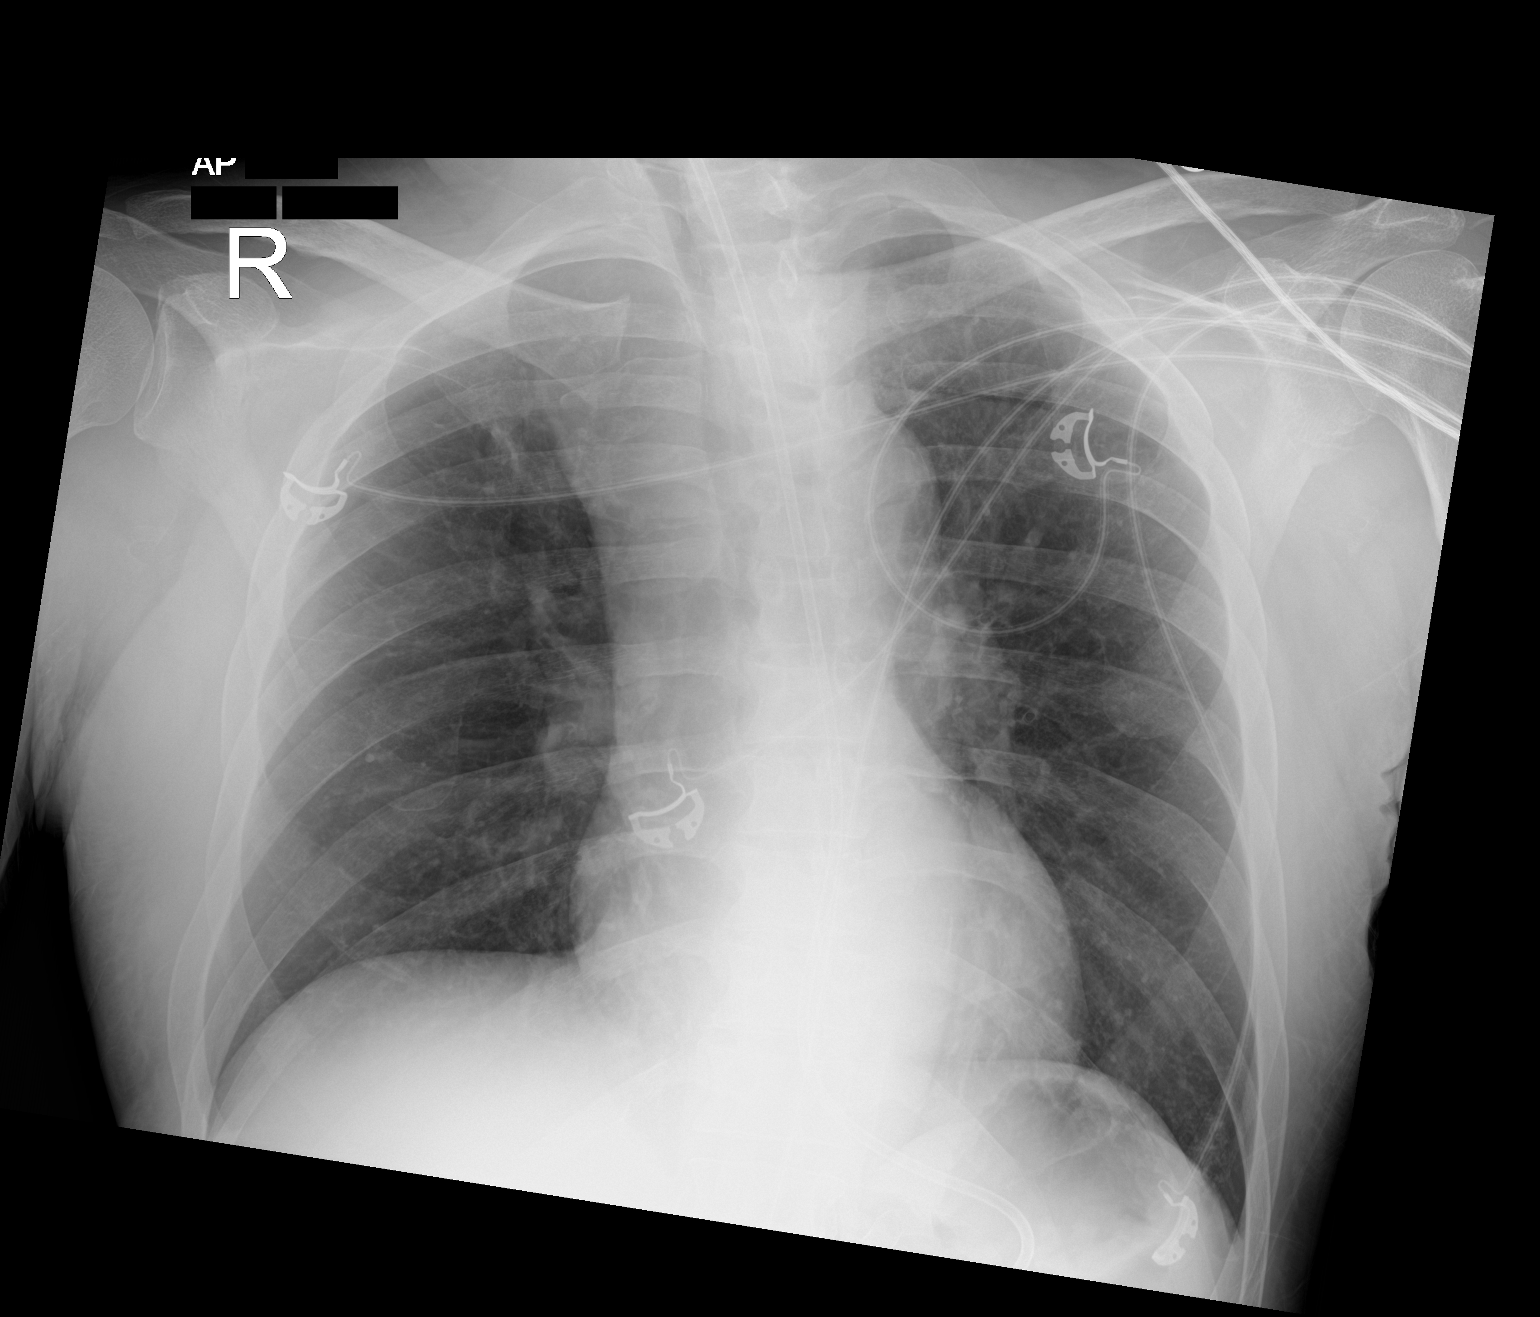

[1 of 1 positions shown; findings below may reference images not displayed]

FINDINGS: Enteric tube enters stomach with the tip not seen on this image.
Stable cardiomediastinal silhouette with normal heart size. No
pneumothorax. No pleural effusion. Lungs appear clear, with no acute
consolidative airspace disease and no pulmonary edema.
IMPRESSION: No active disease. Enteric tube enters stomach with the tip not seen
on this image.

## 2020-07-18 IMAGING — CT CT ANGIO HEAD-NECK (W OR W/O PERF)
1 of 8 series · 14 of 47 positions shown · IV contrast (OMNI)
Comparison: Noncontrast head CT performed earlier today [DATE].
Chest CT [DATE]. Brain MRI [DATE]. CT angiogram head/neck
[DATE].

CLINICAL DATA: Neuro deficit, acute, stroke suspected.

EXAM:
CT ANGIOGRAPHY HEAD AND NECK
TECHNIQUE: Multidetector CT imaging of the head and neck was performed using
the standard protocol during bolus administration of intravenous
contrast. Multiplanar CT image reconstructions and MIPs were
obtained to evaluate the vascular anatomy. Carotid stenosis
measurements (when applicable) are obtained utilizing NASCET
criteria, using the distal internal carotid diameter as the
denominator.
CONTRAST:  75mL OMNIPAQUE IOHEXOL 350 MG/ML SOLN

[Series 6: thin · axial · 0.55mm/px · z∈[-254,+58]mm · 14 of 723 slices shown]
[im 49/723  brain]
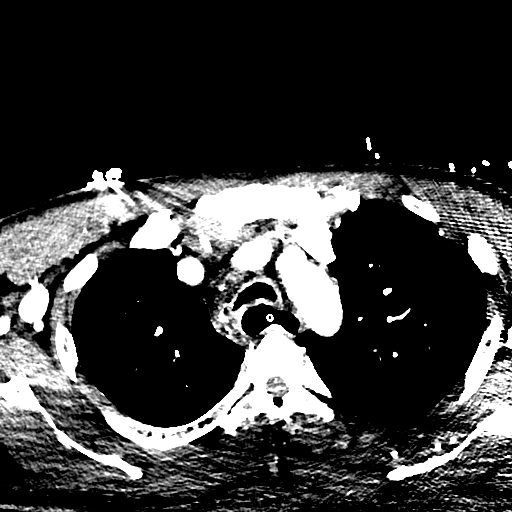
[im 97/723  bone]
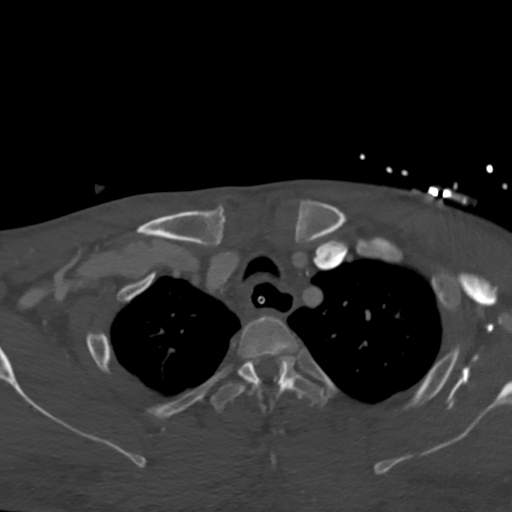
[im 145/723  brain]
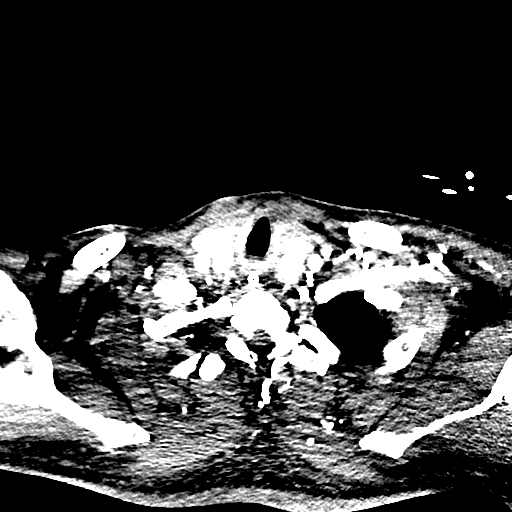
[im 193/723  bone]
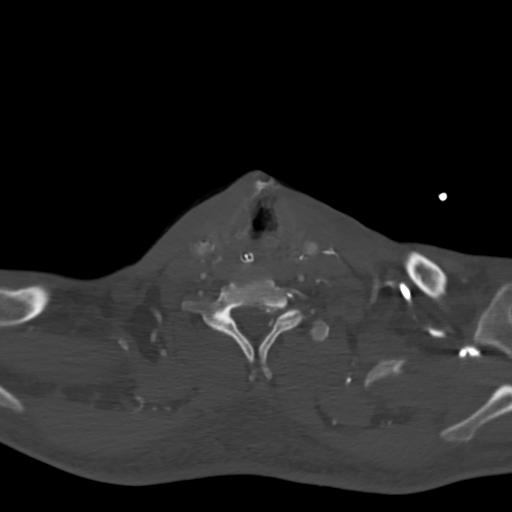
[im 241/723  brain]
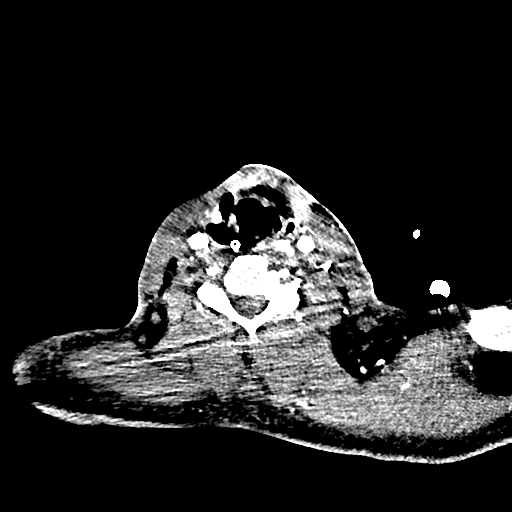
[im 289/723  bone]
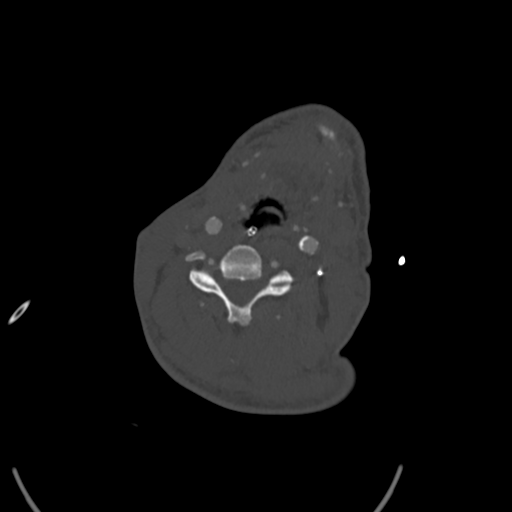
[im 337/723  brain]
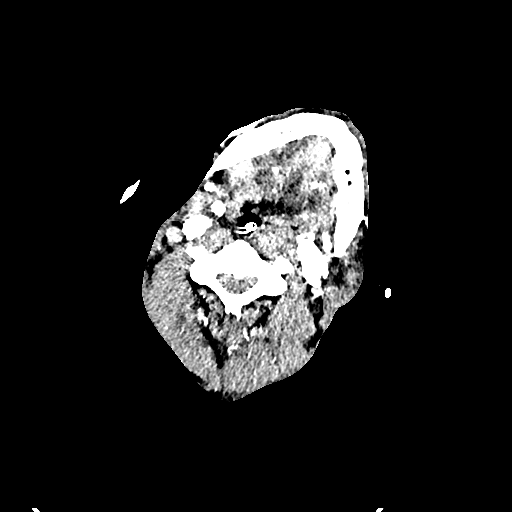
[im 386/723  bone]
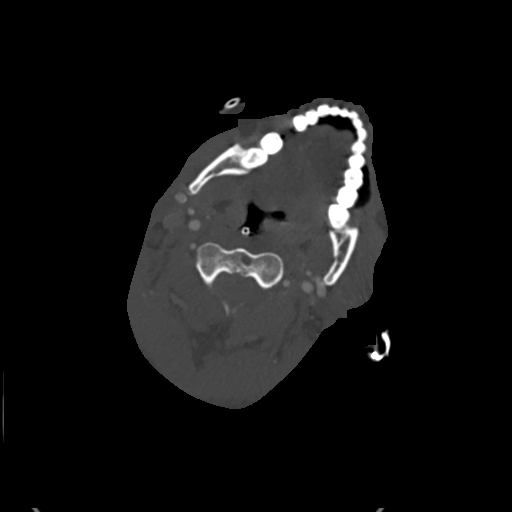
[im 434/723  brain]
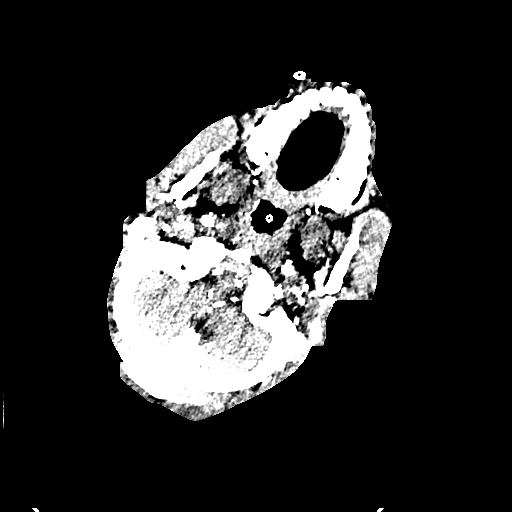
[im 482/723  bone]
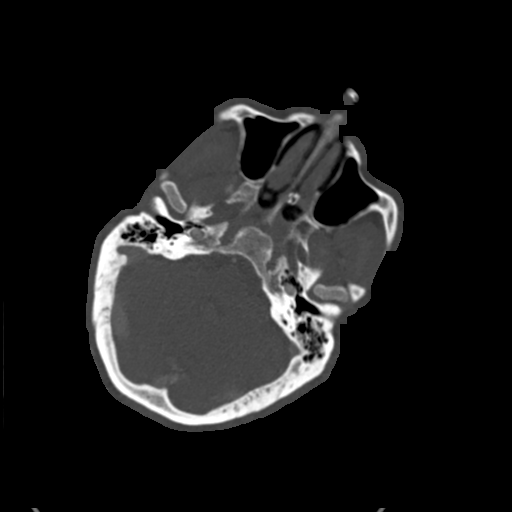
[im 530/723  brain]
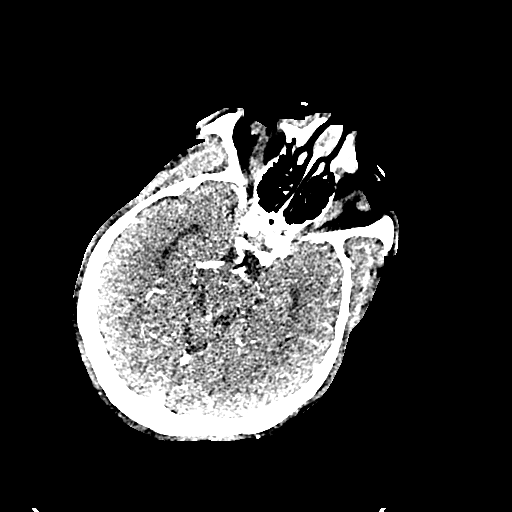
[im 578/723  bone]
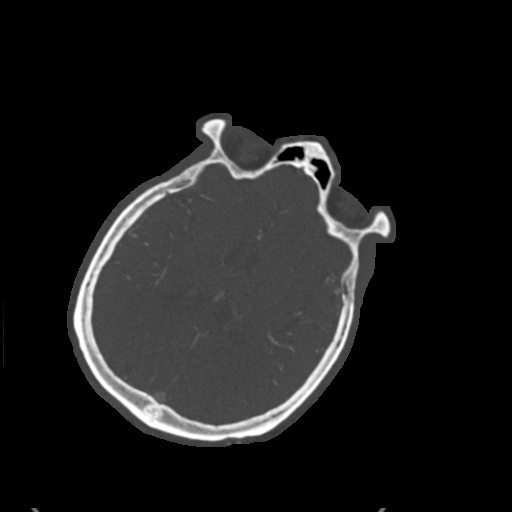
[im 626/723  brain]
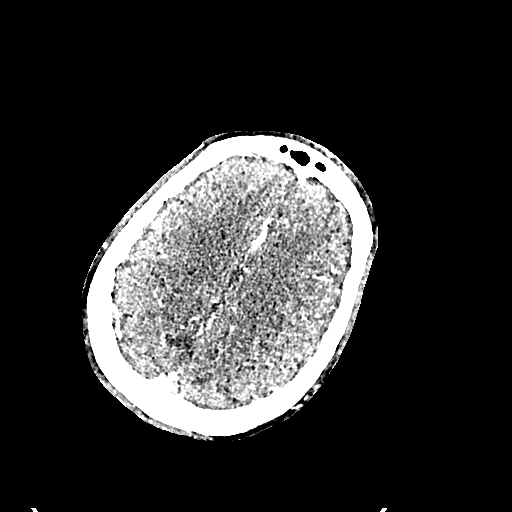
[im 674/723  bone]
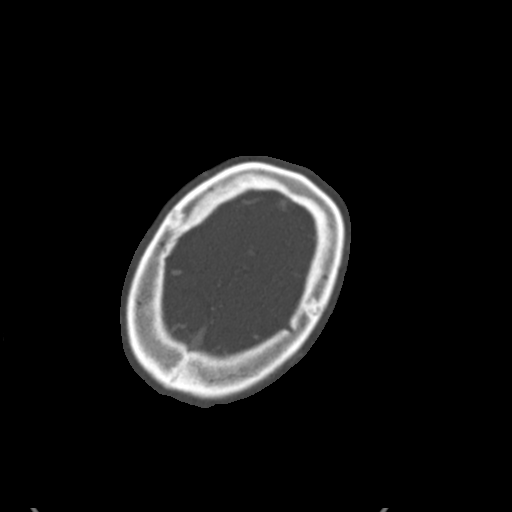

[14 of 47 positions shown; findings below may reference images not displayed]

FINDINGS: CTA NECK FINDINGS

The examination is moderately motion degraded, limiting evaluation.

Aortic arch: Standard aortic branching atherosclerotic plaque within
the visualized aortic arch and proximal major branch vessels of the
neck. No hemodynamically significant innominate or proximal
subclavian artery stenosis.

Right carotid system: CCA and ICA patent within the neck. Soft and
calcified plaque within the mid to distal CCA, carotid bifurcation
and proximal ICA without appreciable hemodynamically significant
stenosis (50% or greater).

Left carotid system: CCA and ICA patent within the neck. Soft and
calcified plaque within mid distal CCA, carotid bifurcation and
proximal ICA. No appreciable hemodynamically significant stenosis
(50% or greater).

Vertebral arteries: Vertebral arteries codominant patent within the
neck without appreciable stenosis.

Skeleton: No acute bony abnormality or aggressive osseous lesion.

Other neck: No neck mass or cervical lymphadenopathy.

Upper chest: No consolidation within the imaged lung apices.
Redemonstrated but partially visualized right paratracheal
lymphadenopathy. Please refer to the prior chest CT of [DATE]
for further description. Partially visualized enteric tube.

Review of the MIP images confirms the above findings

CTA HEAD FINDINGS

There is venous contamination in the examination is mildly motion
degraded, limiting evaluation.

Anterior circulation:

The intracranial internal carotid arteries are patent. Calcified
plaque within both vessels without significant stenosis. The M1
middle cerebral arteries are patent. No M2 proximal branch occlusion
or high-grade proximal stenosis is identified. The anterior cerebral
arteries are patent. Partially azygos configuration of the A2 and
more distal anterior cerebral arteries. No intracranial aneurysm is
identified.

Posterior circulation:

The intracranial vertebral arteries are patent. The intracranial
right vertebral artery is developmentally diminutive beyond the
origin of the right PICA. The basilar artery is patent. The
posterior cerebral arteries are patent. Fetal origin left posterior
cerebral artery. A sizable right posterior communicating artery is
also present. Atherosclerotic irregularity of both posterior
cerebral arteries without high-grade proximal stenosis identified.

Venous sinuses: Within the limitations of contrast timing, no
convincing thrombus.

Anatomic variants: As described

Review of the MIP images confirms the above findings

No emergent large vessel occlusion identified. These results were
called by telephone at the time of interpretation on [DATE] at
[DATE] to provider GAGNY , who verbally acknowledged
these results.
IMPRESSION: CTA neck:

1. Moderately motion degraded examination.
2. The common carotid and internal carotid arteries are patent
within the neck with atherosclerotic disease, but without
appreciable hemodynamically significant stenosis (50% or greater).
3. Vertebral arteries codominant patent within the neck without
appreciable stenosis.

CTA head:

Intracranial sclerotic disease, as described. No intracranial large
vessel occlusion or proximal high-grade arterial stenosis
identified.

## 2020-07-18 IMAGING — DX DG CHEST 1V PORT
1 series · 2 of 2 positions shown · non-contrast
Comparison: [DATE]

CLINICAL DATA: Intubation

EXAM:
PORTABLE CHEST 1 VIEW

[Series 1: chest · 0.14mm/px · 2 of 2 slices shown]
[im 1/2]
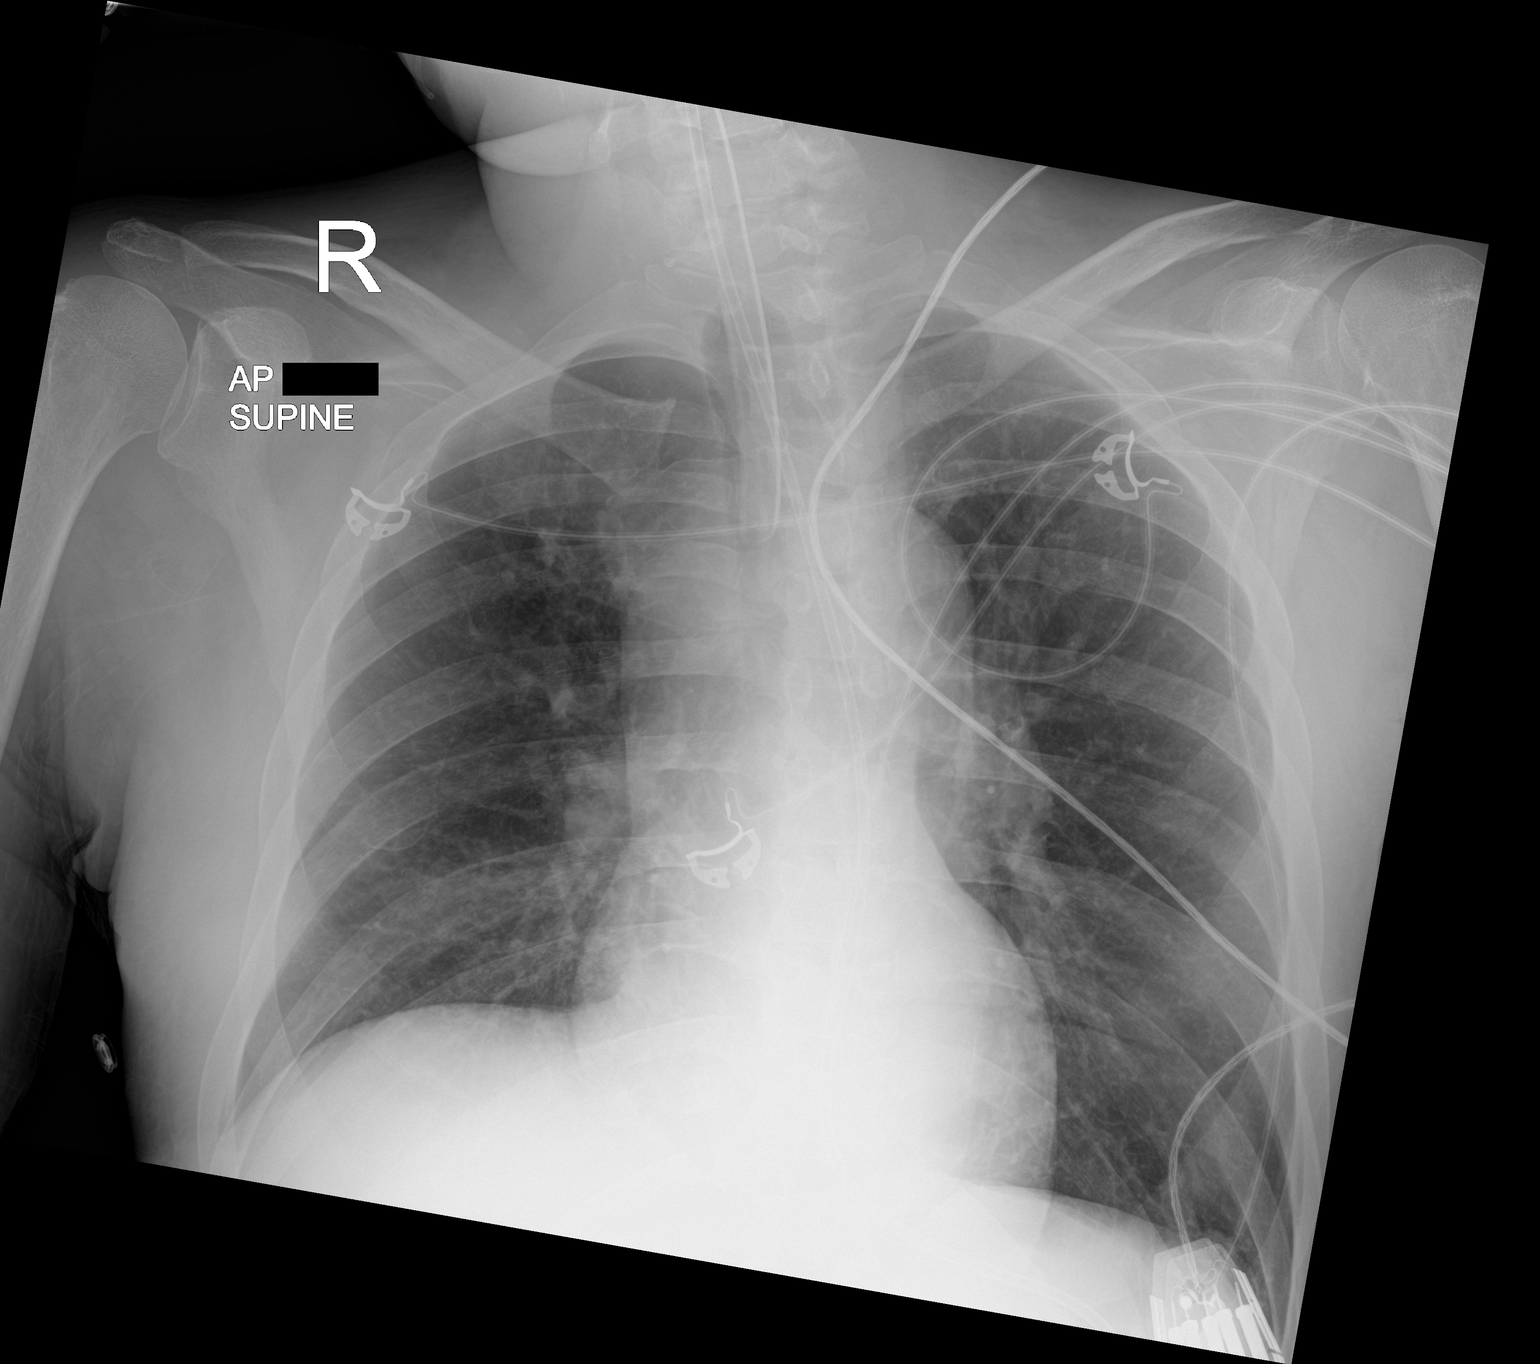
[im 2/2]
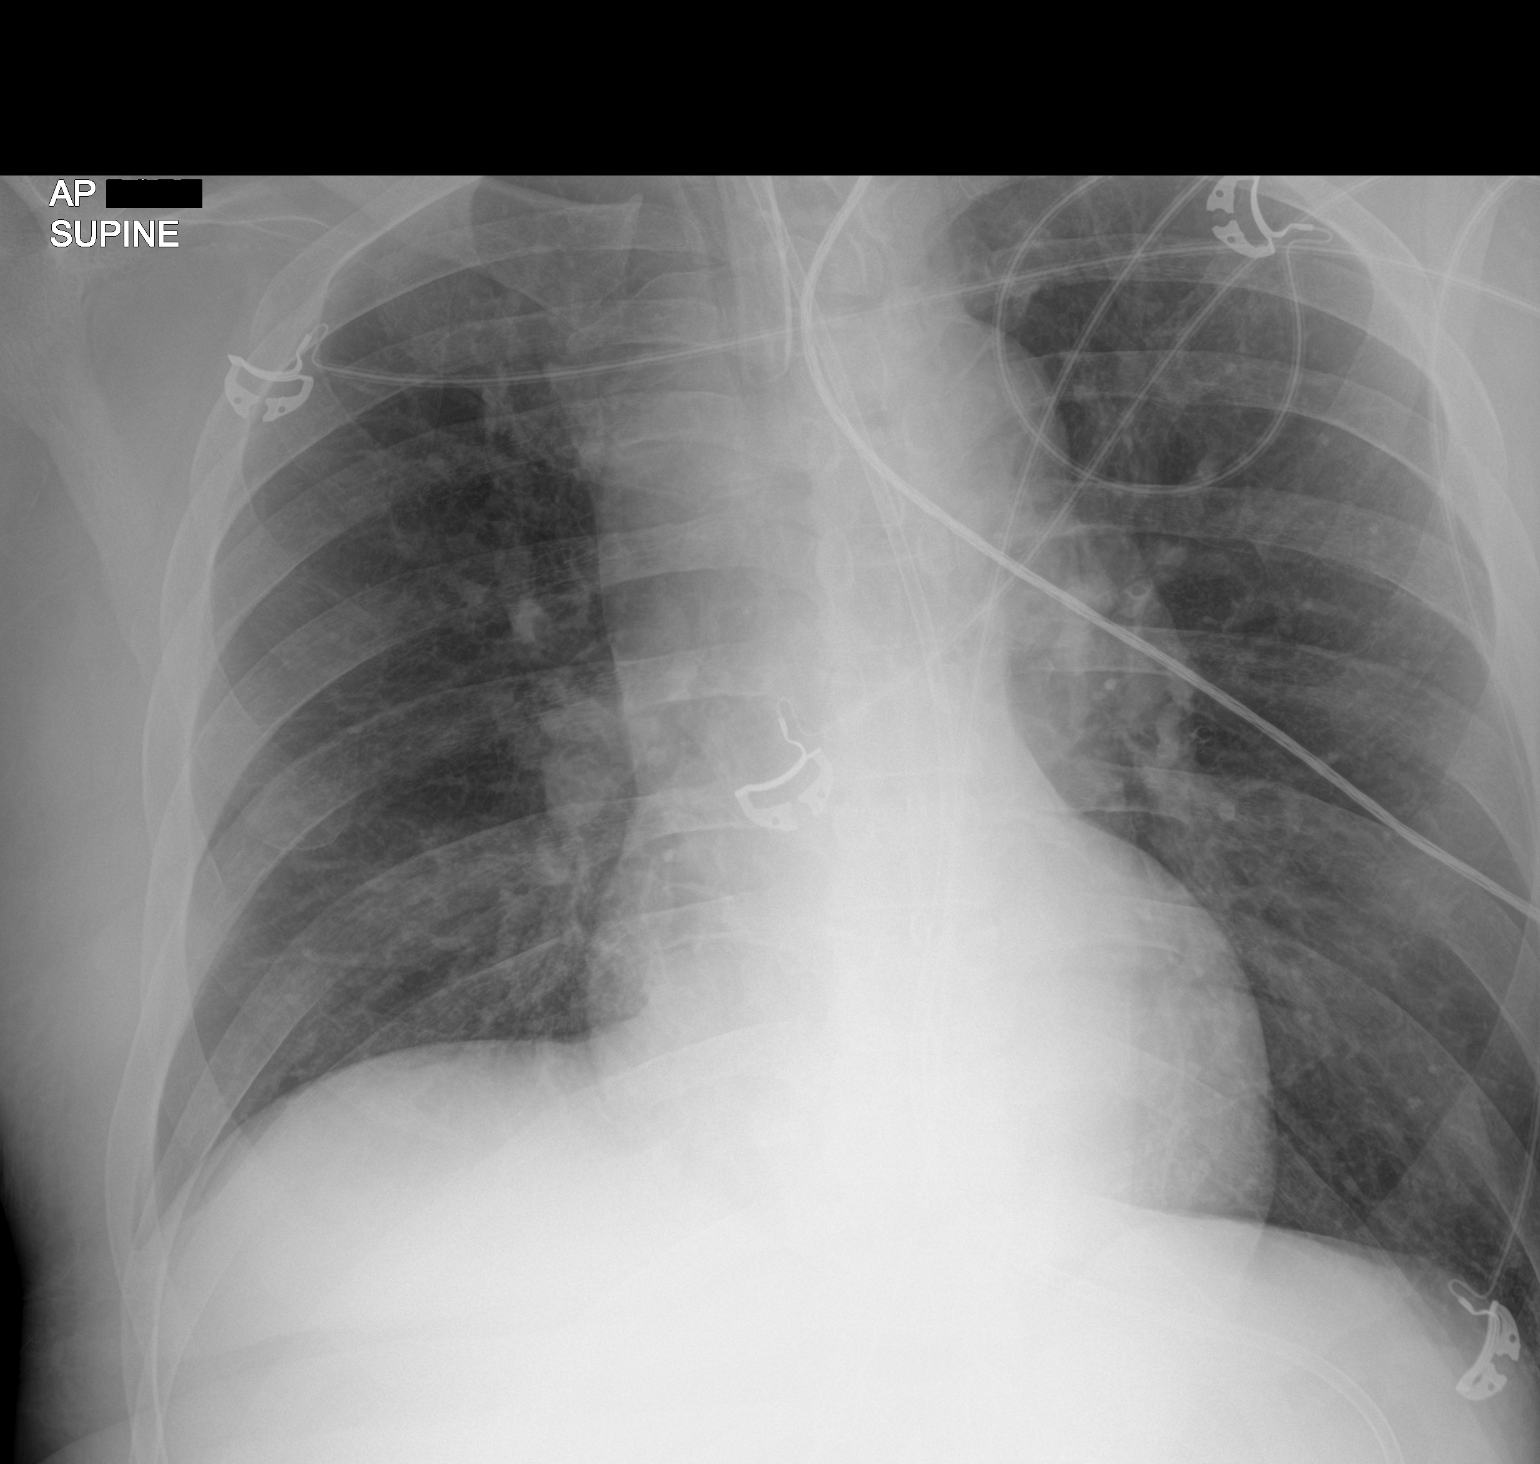

[2 of 2 positions shown; findings below may reference images not displayed]

FINDINGS: Endotracheal tube is 5 cm above the carina. Heart is normal size. No
confluent airspace opacities or effusions. No acute bony
abnormality.
IMPRESSION: Endotracheal tube 5 cm above the carina.

No acute cardiopulmonary disease.

## 2020-07-18 MED ORDER — FENTANYL CITRATE (PF) 100 MCG/2ML IJ SOLN
INTRAMUSCULAR | Status: AC
  Administered 2020-07-18: 100 ug via INTRAVENOUS
  Filled 2020-07-18: qty 2

## 2020-07-18 MED ORDER — LACTATED RINGERS IV BOLUS
1000.0000 mL | Freq: Once | INTRAVENOUS | Status: AC
  Administered 2020-07-18: 1000 mL via INTRAVENOUS

## 2020-07-18 MED ORDER — IOHEXOL 350 MG/ML SOLN
75.0000 mL | Freq: Once | INTRAVENOUS | Status: AC | PRN
  Administered 2020-07-18: 75 mL via INTRAVENOUS

## 2020-07-18 MED ORDER — FENTANYL CITRATE (PF) 100 MCG/2ML IJ SOLN: 100.0000 ug | Freq: Once | INTRAMUSCULAR | Status: AC

## 2020-07-18 MED ORDER — CHLORHEXIDINE GLUCONATE 0.12% ORAL RINSE (MEDLINE KIT)
15.0000 mL | Freq: Two times a day (BID) | OROMUCOSAL | Status: DC
  Administered 2020-07-18 – 2020-07-24 (×13): 15 mL via OROMUCOSAL

## 2020-07-18 MED ORDER — ROCURONIUM BROMIDE 10 MG/ML (PF) SYRINGE
PREFILLED_SYRINGE | INTRAVENOUS | Status: AC
  Administered 2020-07-18: 60 mg via INTRAVENOUS
  Filled 2020-07-18: qty 10

## 2020-07-18 MED ORDER — LORAZEPAM 2 MG/ML IJ SOLN: 2.0000 mg | Freq: Once | INTRAMUSCULAR | Status: AC

## 2020-07-18 MED ORDER — CHLORHEXIDINE GLUCONATE CLOTH 2 % EX PADS
6.0000 | MEDICATED_PAD | Freq: Every day | CUTANEOUS | Status: DC
  Administered 2020-07-18 – 2020-07-29 (×12): 6 via TOPICAL

## 2020-07-18 MED ORDER — ETOMIDATE 2 MG/ML IV SOLN: 20.0000 mg | Freq: Once | INTRAVENOUS | Status: AC

## 2020-07-18 MED ORDER — MIDAZOLAM HCL 2 MG/2ML IJ SOLN
INTRAMUSCULAR | Status: AC
  Filled 2020-07-18: qty 2

## 2020-07-18 MED ORDER — NOREPINEPHRINE 4 MG/250ML-% IV SOLN: 0.0000 ug/min | INTRAVENOUS | Status: DC

## 2020-07-18 MED ORDER — LANTHANUM CARBONATE 500 MG PO CHEW
500.0000 mg | CHEWABLE_TABLET | Freq: Three times a day (TID) | ORAL | Status: DC
  Administered 2020-07-18 – 2020-07-25 (×21): 500 mg
  Filled 2020-07-18 (×21): qty 1

## 2020-07-18 MED ORDER — ROCURONIUM BROMIDE 50 MG/5ML IV SOLN
60.0000 mg | Freq: Once | INTRAVENOUS | Status: AC
  Filled 2020-07-18: qty 6

## 2020-07-18 MED ORDER — NOREPINEPHRINE 4 MG/250ML-% IV SOLN
INTRAVENOUS | Status: AC
  Administered 2020-07-18: 4 mg
  Filled 2020-07-18: qty 250

## 2020-07-18 MED ORDER — NEPRO/CARBSTEADY PO LIQD
1000.0000 mL | ORAL | Status: DC
  Administered 2020-07-20 – 2020-07-24 (×4): 1000 mL
  Filled 2020-07-18 (×11): qty 1000

## 2020-07-18 MED ORDER — ORAL CARE MOUTH RINSE
15.0000 mL | OROMUCOSAL | Status: DC
  Administered 2020-07-18 – 2020-07-24 (×54): 15 mL via OROMUCOSAL

## 2020-07-18 MED ORDER — ETOMIDATE 2 MG/ML IV SOLN
INTRAVENOUS | Status: AC
  Administered 2020-07-18: 20 mg via INTRAVENOUS
  Filled 2020-07-18: qty 20

## 2020-07-18 MED ORDER — FENTANYL CITRATE (PF) 100 MCG/2ML IJ SOLN
50.0000 ug | INTRAMUSCULAR | Status: DC | PRN
  Filled 2020-07-18: qty 2

## 2020-07-18 MED ORDER — PIPERACILLIN-TAZOBACTAM IN DEX 2-0.25 GM/50ML IV SOLN
2.2500 g | Freq: Three times a day (TID) | INTRAVENOUS | Status: AC
  Administered 2020-07-18 – 2020-07-20 (×8): 2.25 g via INTRAVENOUS
  Filled 2020-07-18 (×8): qty 50

## 2020-07-18 MED ORDER — LORAZEPAM 2 MG/ML IJ SOLN
INTRAMUSCULAR | Status: AC
  Administered 2020-07-18: 2 mg via INTRAVENOUS
  Filled 2020-07-18: qty 1

## 2020-07-18 MED ORDER — NOREPINEPHRINE 4 MG/250ML-% IV SOLN
0.0000 ug/min | INTRAVENOUS | Status: DC
  Administered 2020-07-20: 3 ug/min via INTRAVENOUS
  Administered 2020-07-21 – 2020-07-24 (×3): 2 ug/min via INTRAVENOUS
  Filled 2020-07-18 (×3): qty 250

## 2020-07-18 MED ORDER — FENTANYL CITRATE (PF) 100 MCG/2ML IJ SOLN
50.0000 ug | INTRAMUSCULAR | Status: DC | PRN
  Administered 2020-07-18: 50 ug via INTRAVENOUS
  Administered 2020-07-19 – 2020-07-24 (×9): 100 ug via INTRAVENOUS
  Filled 2020-07-18: qty 2
  Filled 2020-07-18: qty 4
  Filled 2020-07-18 (×6): qty 2
  Filled 2020-07-18: qty 4
  Filled 2020-07-18 (×3): qty 2

## 2020-07-18 NOTE — Hospital Course (Signed)
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 7/6-hypertensive urgency on admission 7/7 progressive hypoxemia resulting in intubation-CV--feeding tube placed -LP-MRI-EEG--- paratracheal adenopathy found on CT chest 7/9 self extubated-Acyclovir vancomycin stopped-ceftriaxone continued 2/2 1 out of 4 blood culture positive for Staph hominis   Sodium 138 potassium 4.1 BUN/creatinine 65/8.0 Phosphorus 7.1 WBC 7.4 hemoglobin 12.4 platelet 127

## 2020-07-18 NOTE — Progress Notes (Signed)
[facility] Progress Note   Subjective:  pt reintubated this morning, possible seizures per CCM.  CXR clear    Objective Vitals:   07/18/20 1530 07/18/20 1545 07/18/20 1602 07/18/20 1626  BP: (!) 86/43 (!) 121/57  107/80  Pulse: 74 79  77  Resp: 20 20    Temp:      TempSrc:      SpO2: 100% 100% 100%   Weight:      Height:       Physical Exam  on vent ,sedated  no jvd  throat ett in place  Chest cta bilat and lat  Cor reg no RG  Abd soft ntnd no ascites   Ext bilat BKA, no edema   Neuro on vent and sedated, not following commands Access RUE AVF bruit and thrill   Additional Objective Labs: Basic Metabolic Panel: Recent Labs  Lab 07/15/20 0352 07/15/20 1603 07/16/20 0317 07/17/20 0336 07/18/20 0346 07/18/20 1212  NA 136  --  134* 137 138 138  K 4.3  --  3.6 3.9 4.1 3.9  CL 97*  --  94* 98 96*  --   CO2 25  --  '27 28 22  '$ --   GLUCOSE 169*  --  209* 280* 310*  --   BUN 32*  --  17 42* 65*  --   CREATININE 6.84*  --  4.40* 6.52* 8.06*  --   CALCIUM 9.9  --  9.3 10.2 10.9*  --   PHOS 6.4* 7.2*  --   --  7.1*  --       CBC: Recent Labs  Lab 07/13/20 1549 07/13/20 1639 07/14/20 1008 07/14/20 1112 07/14/20 1822 07/15/20 0352 07/16/20 0317 07/17/20 0336 07/18/20 0346 07/18/20 1212  WBC 6.9   < > 9.2  --  10.9* 9.5 7.3 7.7 7.4  --   NEUTROABS 4.1  --  5.8  --  5.3  --   --   --   --   --   HGB 11.9*   < > 10.8*   < > 11.2* 10.2* 11.4* 11.8* 12.4* 12.9*  HCT 38.5*   < > 33.8*   < > 35.1* 33.4* 36.1* 37.5* 38.1* 38.0*  MCV 96.7   < > 94.7  --  95.4 96.3 95.3 94.9 92.7  --   PLT 80*   < > 82*  --  104* 94* 82* 97* 127*  --    < > = values in this interval not displayed.     Medications:  sodium chloride     sodium chloride 250 mL (07/18/20 1314)   feeding supplement (NEPRO CARB STEADY) 45 mL/hr at 07/18/20 1307   norepinephrine (LEVOPHED) Adult infusion Stopped (07/18/20 1100)   piperacillin-tazobactam (ZOSYN)  IV 2.25 g (07/18/20  1334)    aspirin  81 mg Per Tube Daily   atorvastatin  40 mg Per Tube Daily   carvedilol  6.25 mg Per Tube BID WC   Chlorhexidine Gluconate Cloth  6 each Topical Q1200   docusate  100 mg Per Tube BID   feeding supplement (PROSource TF)  45 mL Per Tube TID   heparin injection (subcutaneous)  5,000 Units Subcutaneous Q8H   insulin aspart  0-15 Units Subcutaneous Q4H   insulin aspart  2 Units Subcutaneous Q4H   insulin glargine  10 Units Subcutaneous Daily   lanthanum  500 mg Per Tube TID WC   mupirocin ointment  1 application Nasal BID   polyethylene  glycol  17 g Per Tube Daily   sodium chloride flush  10-40 mL Intracatheter Q12H    Dialysis Orders: MWF @ [facility] [facility] 4hr, 400/A1.5, EDW 68kg, 2K/2.5Ca, AVF, UFP #2, no heparin - Hecoral 51mg Iv q HD  Assessment/Plan: # Hypertensive Urgency: mostly resolved, BP's normal to low normal today  # Hyperkalemia: improved , switched to nepro  # Acute hypoxic resp failure. Intubated 7/7.  Then self-extubated on 7/9 and reintubated today 7/11. Per CCM.   # ESRD:  HD per MWF schedule. Today's HD has been postponed until tomorrow.   # Bacteremia - Blood cx's 1/2 + for staph hominis on 7/06.  Had LP on 7/7 with possible meningitis/encephalitis. On IV zosyn now  # Anemia of CKD no ESA needs. monitor trends  # Secondary hyperparathyroidism: hold VDRA for calcium for now.  Hyperphos - resumed home renvela. Switched to nepro for feeds  # T2DM - per primary team  # Hx ICH - head CT without acute abnormality multifactorial.   # ?Mediastinal mass: CT malignancy cannot be excluded per CT; also with PNA per CT  RSol Blazing MD 07/18/2020,4:41 PM [facility]

## 2020-07-18 NOTE — Code Documentation (Addendum)
Stroke Response Nurse Documentation Code Documentation  Rashed D Caminero is a 50 y.o. male admitted to [facility] on 07/13/2020 with past medical hx of diabetes, CKD, Dialysis, stroke. Code stroke was activated by 20M06 Staff. Patient from was LKW at 0400 when RN completed neuro exam. Noted to have bilateral equal strength, track with nurse, and move all limbs purposeful and now complaining of unresponsive, bilateral weakness in arms and legs, left gaze preference, and no blink to threat.  Stroke team at the bedside on patient arrival. Labs drawn and patient cleared for CT by Dr. Verlon Au. Patient to CT with team. NIHSS 32, see documentation for details and code stroke times. Patient with decreased LOC, disoriented, not following commands, left gaze preference , bilateral hemianopia, bilateral arm weakness, bilateral leg weakness, bilateral decreased sensation, Global aphasia , and dysarthria  on exam. The following imaging was completed:  (CT, CTA head and neck. Delay in patient care due to need for IV access.   Patient is not a candidate for tPA due to being outside window.   Care/Plan: Potential Seizure. Ativan Challenge per MD Bhagat, Intubation, and Neuro assessments q1 x 4 hours and then q4 hours. Bedside handoff with 20M RN Ginger.    Kathrin Greathouse  Stroke Response RN

## 2020-07-18 NOTE — Progress Notes (Signed)
Results for ADMIRAL, WIESER (MRN [id number]) as of 07/18/2020 13:28  Ref. Range 07/17/2020 23:12 07/18/2020 03:36 07/18/2020 07:46 07/18/2020 09:23 07/18/2020 11:28  Glucose-Capillary Latest Ref Range: 70 - 99 mg/dL 226 (H) 299 (H) 279 (H) 312 (H) 283 (H)  Noted that blood sugars continue to be greater than 180 mg/dl.  Recommend increasing Lantus to 15 units daily, continuing current Novolog correction scale, and increase Novolog tube feed coverage to 4 units every 4 hours if blood sugars continue to be elevated.   Harvel Ricks RN BSN CDE Diabetes Coordinator Pager: [phone]  8am-5pm

## 2020-07-18 NOTE — Progress Notes (Signed)
Nutrition Follow-up  DOCUMENTATION CODES:   Non-severe (moderate) malnutrition in context of chronic illness  INTERVENTION:   Continue tube feeds via Cortrak tube: - Nepro @ 45 ml/hr (1080 ml/day) - ProSource TF 45 ml TID   Tube feeding regimen at goal provides 2064 kcal, 120 grams of protein, and 785 ml of H2O (meets 100% of estimated needs).  NUTRITION DIAGNOSIS:   Moderate Malnutrition related to chronic illness (ERSD) as evidenced by moderate fat depletion, moderate muscle depletion.  Ongoing, being addressed via TF  GOAL:   Patient will meet greater than or equal to 90% of their needs  Met via TF  MONITOR:   Vent status, Labs, Weight trends, TF tolerance, I & O's  REASON FOR ASSESSMENT:   Ventilator, Consult Enteral/tube feeding initiation and management  ASSESSMENT:   50 year old male who presented to the ED on 7/06 with AMS. PMH of ESRD on HD, T2DM, gastroparesis, remote CVA, HTN, bilateral BKAs, ICH, TIA, HLD. Pt admitted with hypertensive urgency, hyperkalemia, hypoxia.  7/07 - intubated, NG tube placed, s/p lumbar puncture 7/08 - Cortrak placed (tip gastric) 7/09 - pt self-extubated 7/11 - intubated  Discussed pt with RN and during ICU rounds. Pt required intubation this morning due to progressive encephalopathy and Code Stroke. Per CCM notes, no acute findings on CT head and CTA head/neck. There is concern that pt is seizing.  Tube feeds are currently off with plan to restart this afternoon. Nephrology has changed tube feeding formula to Nepro due to hyperphosphatemia. RD will adjust regimen to better meet pt's needs. Will monitor tolerance closely given Nepro is a high-fat formula and pt with history of gastroparesis.  Pt was NPO after self-extubation.  Admit weight: 68.6 kg Current weight: 64.6 kg EDW: 68 kg  Current weight is below EDW but has been stable since 7/08. Pt with mild pitting generalized edema. Will continue to monitor weight  trends.  Current TF: Nepro @ 50 ml/hr, ProSource TF 45 ml TID  Medications reviewed and include: colace, SSI q 4 hours, novolog 2 units q 4 hours, lantus 10 units daily, miralax, renvela TID, IV abx  Labs reviewed: phosphorus 7.1 CBG's: 226-312 x 24 hours  I/O's: +5.6 L since admit  Diet Order:   Diet Order             Diet NPO time specified  Diet effective now                   EDUCATION NEEDS:   No education needs have been identified at this time  Skin:  Skin Assessment: Reviewed RN Assessment  Last BM:  7/11 medium type 6  Height:   Ht Readings from Last 1 Encounters:  07/18/20 5' 11"  (1.803 m)    Weight:   Wt Readings from Last 1 Encounters:  07/18/20 64.6 kg    Ideal Body Weight:  68 kg (adjusted for bilateral BKA)  BMI:  Body mass index is 19.86 kg/m.  Adjusted BMI: 22.6 kg/m  Estimated Nutritional Needs:   Kcal:  1900-2100  Protein:  105-120 grams  Fluid:  UOP + 1000 ml    Gustavus Bryant, MS, RD, LDN Inpatient Clinical Dietitian Please see AMiON for contact information.

## 2020-07-18 NOTE — Procedures (Signed)
Intubation Procedure Note  CORD STRAUGHAN  [id number]  [birth year]  Date:07/18/20  Time:11:02 AM   Provider Performing:Azalya Galyon M Ayesha Rumpf   Procedure: Intubation (31500)  Indication(s) Respiratory Failure  Consent Unable to obtain consent due to emergent nature of procedure.  Anesthesia Etomidate, Rocuronium, and Ativan ('2mg'$ , administered pre-procedure to attempt to assess ictal status)  Time Out Verified patient identification, verified procedure, site/side was marked, verified correct patient position, special equipment/implants available, medications/allergies/relevant history reviewed, required imaging and test results available.  Sterile Technique Usual hand hygeine, masks, and gloves were used  Procedure Description Patient positioned in bed supine.  Sedation given as noted above.  Patient was intubated with endotracheal tube using Glidescope.  View was Grade 1 full glottis .  Number of attempts was 1.  Colorimetric CO2 detector was consistent with tracheal placement. Bilateral breath sounds present on auscultation.  Complications/Tolerance Patient became hypotensive after sedative medications administered; improved with fluid bolus and pressor initiation (subsequently weaned).  Chest X-ray is ordered to verify placement.  EBL Minimal  Specimen(s) None  The entire procedure was supervised/proctored by Larey Days, MD.  Lestine Mount, PA-C [facility] 07/18/20 11:05 AM  Please see [email or web address] for pager details.

## 2020-07-18 NOTE — Progress Notes (Signed)
Hemodialysis- Notified 22M patient will have HD tomorrow am.

## 2020-07-18 NOTE — Progress Notes (Signed)
HD department notified patient is intubated and will need to do HD at bedside.

## 2020-07-18 NOTE — Progress Notes (Signed)
LTM maint complete - [facility] monitored, Event button test confirmed by [facility].   
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
## 2020-07-18 NOTE — Progress Notes (Signed)
STAT LTM set up; no initial skin breakdown was seen; [facility] monitoring, tested event button.

## 2020-07-18 NOTE — Progress Notes (Addendum)
Brief note  Called by RN--more encephalopathic, some eye drift O/e very ill appearing AAM, cshallow breaths--GCS ~E= 2, M=2 V=4==8  Called Code stroke-d/w Dr. Curly [place] app input CXR per my overread clear ABG attempted-unsuccessful  Await CT head  D/w Dr. Marin Roberts need to be re-intubated if encephalopathy doesn't clear and continues to look poorly  Care ceded to ICU team-thanks  CRITICAL CARE Performed by: Nita Sells   Total critical care time: 27 minutes  Critical care time was exclusive of separately billable procedures and treating other patients.  Critical care was necessary to treat or prevent imminent or life-threatening deterioration.  Critical care was time spent personally by me on the following activities: development of treatment plan with patient and/or surrogate as well as nursing, discussions with consultants, evaluation of patient's response to treatment, examination of patient, obtaining history from patient or surrogate, ordering and performing treatments and interventions, ordering and review of laboratory studies, ordering and review of radiographic studies, pulse oximetry and re-evaluation of patient's condition.;  Verneita Griffes, MD [facility] 9:36 AM
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
## 2020-07-19 ENCOUNTER — Inpatient Hospital Stay (HOSPITAL_COMMUNITY): Payer: Medicare Other

## 2020-07-19 DIAGNOSIS — N186 End stage renal disease: Secondary | ICD-10-CM | POA: Diagnosis not present

## 2020-07-19 DIAGNOSIS — G9340 Encephalopathy, unspecified: Secondary | ICD-10-CM | POA: Diagnosis not present

## 2020-07-19 DIAGNOSIS — Z992 Dependence on renal dialysis: Secondary | ICD-10-CM | POA: Diagnosis not present

## 2020-07-19 DIAGNOSIS — R401 Stupor: Secondary | ICD-10-CM | POA: Diagnosis not present

## 2020-07-19 LAB — GLUCOSE, CAPILLARY
Glucose-Capillary: 134 mg/dL — ABNORMAL HIGH (ref 70–99)
Glucose-Capillary: 135 mg/dL — ABNORMAL HIGH (ref 70–99)
Glucose-Capillary: 143 mg/dL — ABNORMAL HIGH (ref 70–99)
Glucose-Capillary: 146 mg/dL — ABNORMAL HIGH (ref 70–99)
Glucose-Capillary: 156 mg/dL — ABNORMAL HIGH (ref 70–99)
Glucose-Capillary: 177 mg/dL — ABNORMAL HIGH (ref 70–99)
Glucose-Capillary: 217 mg/dL — ABNORMAL HIGH (ref 70–99)

## 2020-07-19 LAB — BASIC METABOLIC PANEL
Anion gap: 18 — ABNORMAL HIGH (ref 5–15)
BUN: 84 mg/dL — ABNORMAL HIGH (ref 6–20)
CO2: 23 mmol/L (ref 22–32)
Calcium: 10.9 mg/dL — ABNORMAL HIGH (ref 8.9–10.3)
Chloride: 98 mmol/L (ref 98–111)
Creatinine, Ser: 9.57 mg/dL — ABNORMAL HIGH (ref 0.61–1.24)
GFR, Estimated: 6 mL/min — ABNORMAL LOW (ref >60)
Glucose, Bld: 228 mg/dL — ABNORMAL HIGH (ref 70–99)
Potassium: 4 mmol/L (ref 3.5–5.1)
Sodium: 139 mmol/L (ref 135–145)

## 2020-07-19 LAB — PATHOLOGIST SMEAR REVIEW

## 2020-07-19 LAB — CBC
HCT: 36.6 % — ABNORMAL LOW (ref 39.0–52.0)
Hemoglobin: 11.7 g/dL — ABNORMAL LOW (ref 13.0–17.0)
MCH: 30.1 pg (ref 26.0–34.0)
MCHC: 32 g/dL (ref 30.0–36.0)
MCV: 94.1 fL (ref 80.0–100.0)
Platelets: 158 10*3/uL (ref 150–400)
RBC: 3.89 MIL/uL — ABNORMAL LOW (ref 4.22–5.81)
RDW: 16.6 % — ABNORMAL HIGH (ref 11.5–15.5)
WBC: 10.4 10*3/uL (ref 4.0–10.5)
nRBC: 0 % (ref 0.0–0.2)

## 2020-07-19 IMAGING — MR MR HEAD W/O CM
10 of 11 series · 43 of 48 positions shown · non-contrast
Comparison: [DATE]

CLINICAL DATA: Gaze preference

EXAM:
MRI HEAD WITHOUT CONTRAST
TECHNIQUE: Multiplanar, multiecho pulse sequences of the brain and surrounding
structures were obtained without intravenous contrast.

[Series 5: DWI · axial · 3.0mm · 0.92mm/px · z∈[-62,+90]mm · 9 of 104 slices shown (1 of 4)]
[im 1/104]
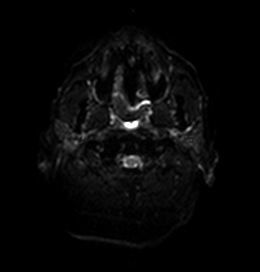
[im 13/104]
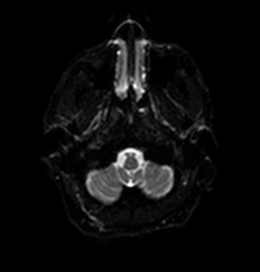
[im 26/104]
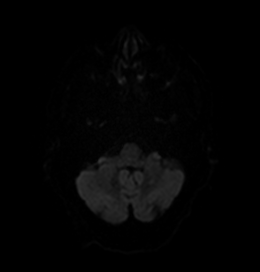
[im 39/104]
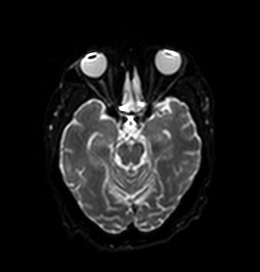
[im 52/104]
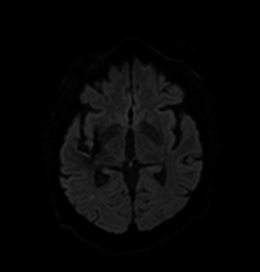
[im 65/104]
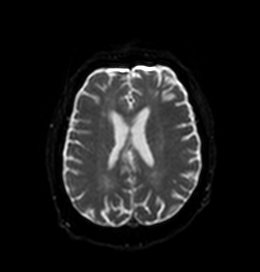
[im 78/104]
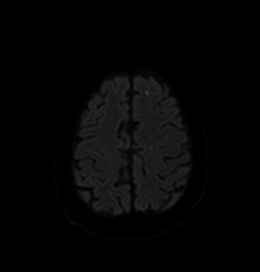
[im 91/104]
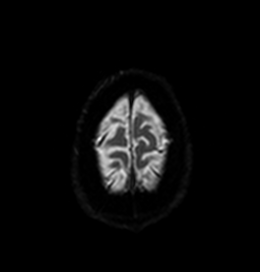
[im 104/104]
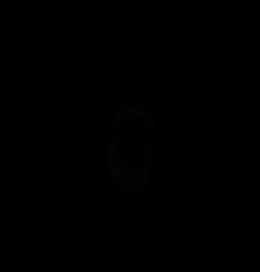

[Series 6: DWI · axial · 3.0mm · 0.92mm/px · z∈[-62,+90]mm · 5 of 51 slices shown (2 of 4)]
[im 1/51]
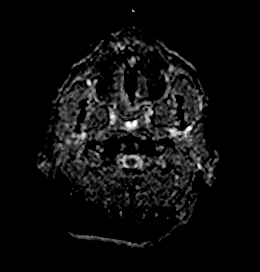
[im 13/51]
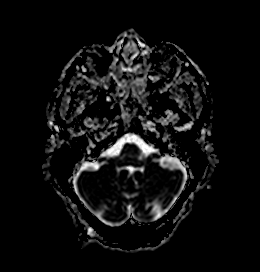
[im 26/51]
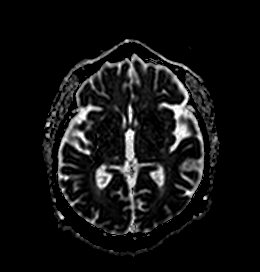
[im 38/51]
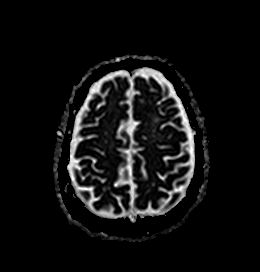
[im 51/51]
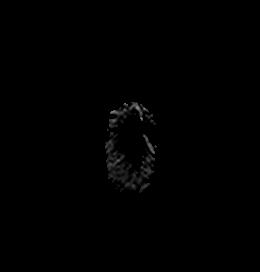

[Series 7: DWI · coronal · 4.0mm · 0.88mm/px · 7 of 72 slices shown (3 of 4)]
[im 1/72]
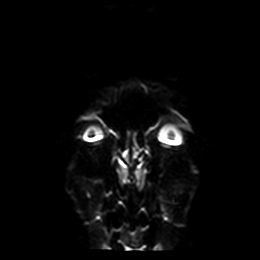
[im 12/72]
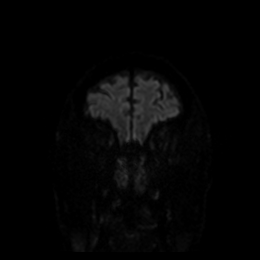
[im 24/72]
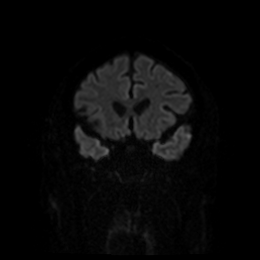
[im 36/72]
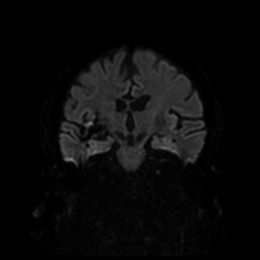
[im 48/72]
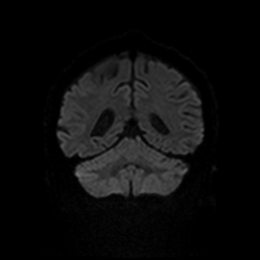
[im 60/72]
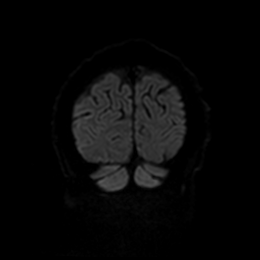
[im 72/72]
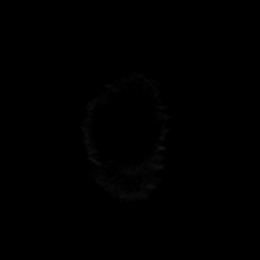

[Series 8: DWI · coronal · 4.0mm · 0.88mm/px · 3 of 36 slices shown (4 of 4)]
[im 1/36]
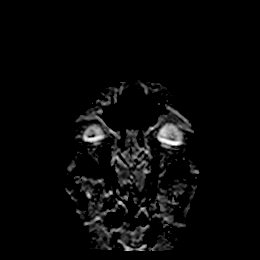
[im 18/36]
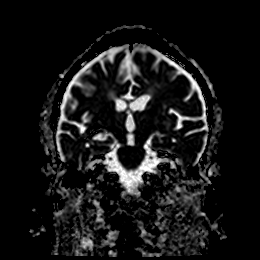
[im 36/36]
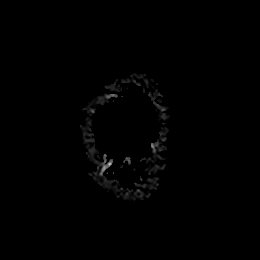

[Series 9: FLAIR · axial · 5.0mm · 0.45mm/px · z∈[-53,+91]mm · 2 of 25 slices shown]
[im 1/25]
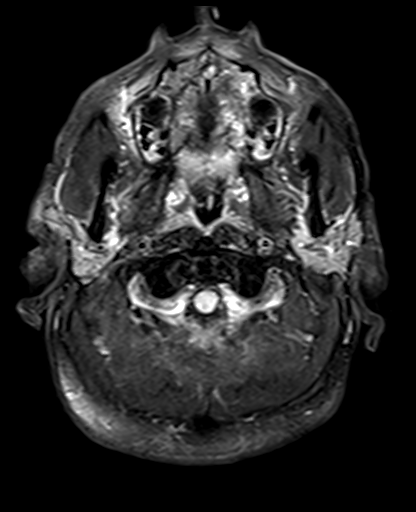
[im 25/25]
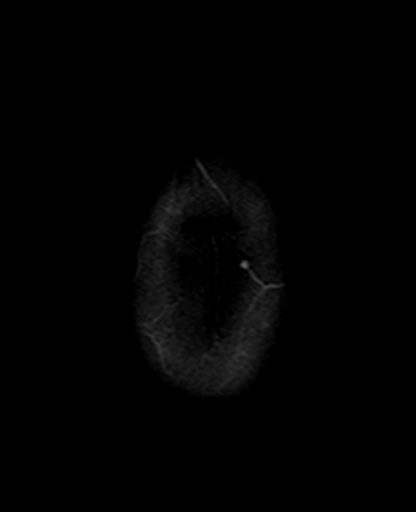

[Series 11: pha_images · axial · 3.0mm · 0.90mm/px · z∈[-55,+91]mm · 5 of 50 slices shown]
[im 1/50]
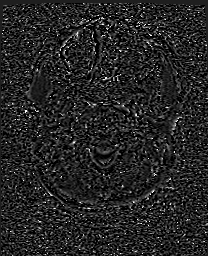
[im 13/50]
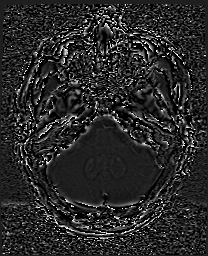
[im 25/50]
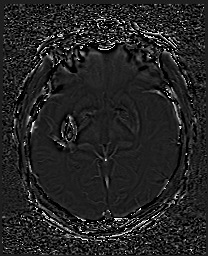
[im 37/50]
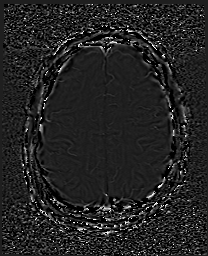
[im 50/50]
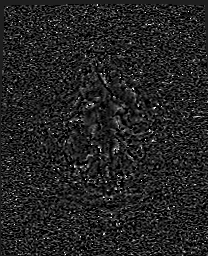

[Series 12: swi_images · axial · 3.0mm · 0.90mm/px · z∈[-55,+97]mm · 5 of 52 slices shown]
[im 1/52]
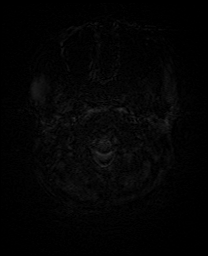
[im 13/52]
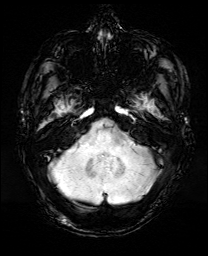
[im 26/52]
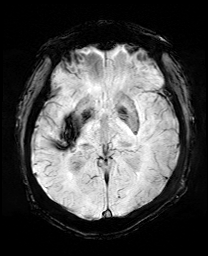
[im 39/52]
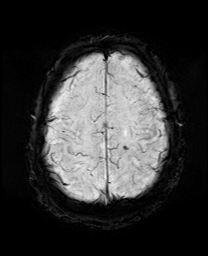
[im 52/52]
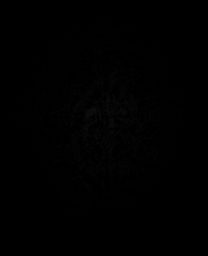

[Series 14: T1 · sagittal · 5.0mm · 0.75mm/px · 2 of 25 slices shown]
[im 1/25]
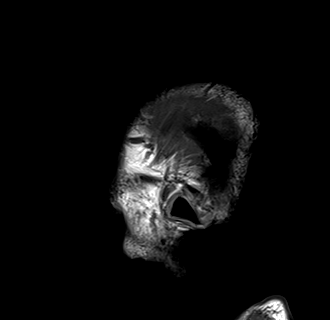
[im 25/25]
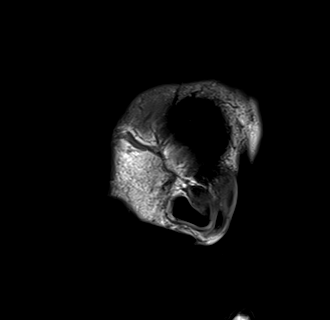

[Series 15: T2 · axial · 5.0mm · 0.72mm/px · z∈[-54,+89]mm · 2 of 25 slices shown (1 of 2)]
[im 1/25]
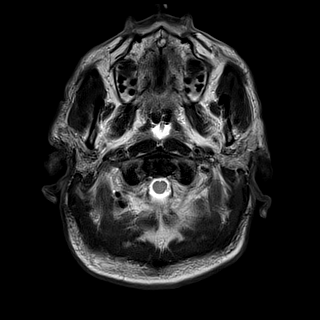
[im 25/25]
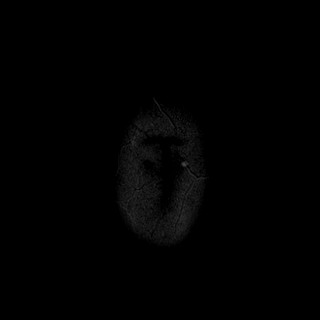

[Series 17: T2 · coronal · 5.0mm · 0.34mm/px · 3 of 29 slices shown (2 of 2)]
[im 1/29]
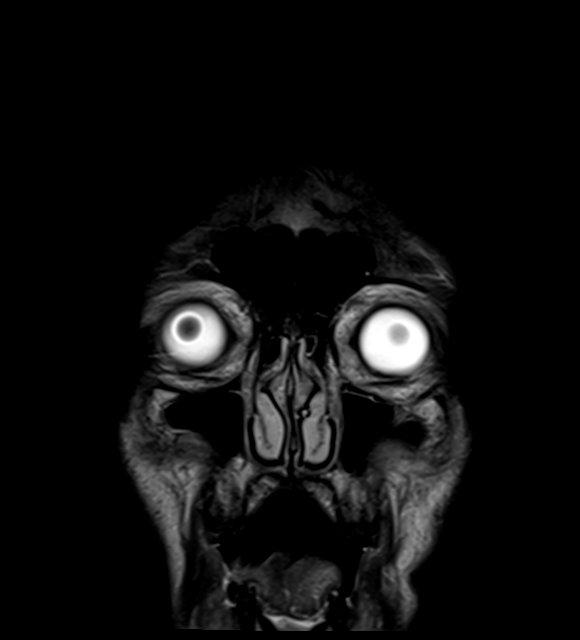
[im 15/29]
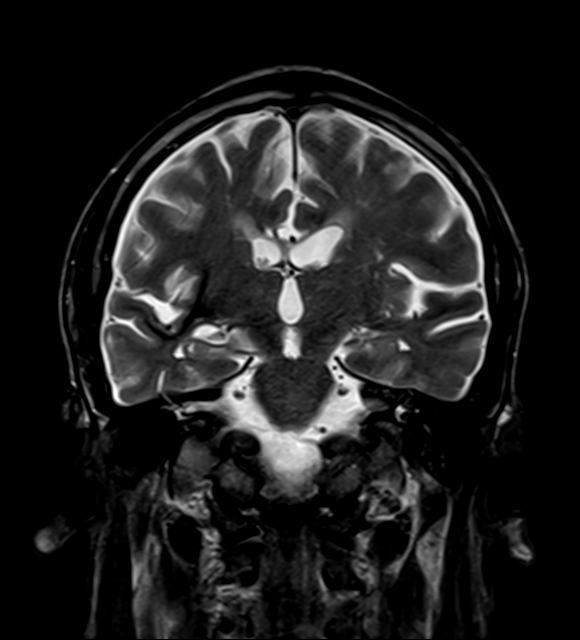
[im 29/29]
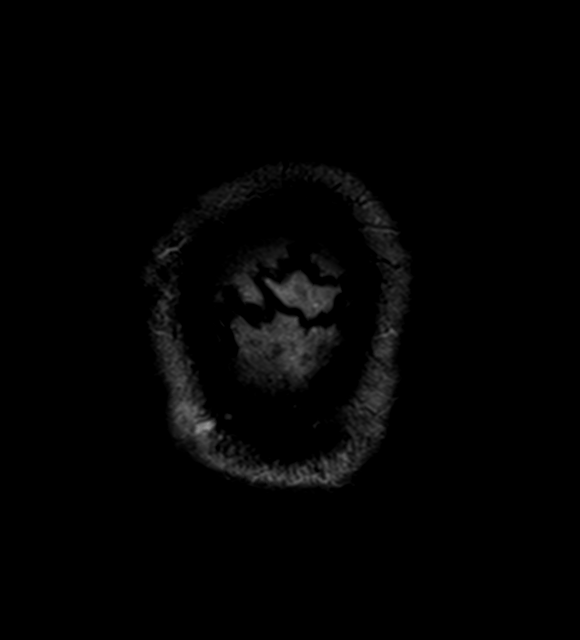

[43 of 48 positions shown; findings below may reference images not displayed]

FINDINGS: Brain: A few small foci of diffusion hyperintensity are again
identified. These were previously present. There is a new focus in
the left centrum semiovale. Other foci on the prior study are less
apparent.

Chronic hemorrhagic infarction involving right subinsular region and
adjacent operculum. Foci of chronic microhemorrhage are again
identified in the left frontal subcortical white matter and
posterior left putamen. Small chronic left frontal
cortical/subcortical infarct. Additional patchy foci of T2
hyperintensity in the supratentorial white matter are nonspecific
but probably reflect stable chronic microvascular ischemic changes.
Multiple chronic infarcts of the cerebellum bilaterally,
parasagittal right pons, and deep cerebral white matter.

There is no hydrocephalus or extra-axial fluid collection.

Vascular: Diminished vertebrobasilar flow voids, noting large
bilateral posterior communicating arteries.

Skull and upper cervical spine: Normal marrow signal is preserved.

Sinuses/Orbits: Paranasal sinuses are aerated. Orbits are
unremarkable.

Other: Sella is unremarkable.  Mastoid air cells are clear.
IMPRESSION: New acute infarct left centrum semiovale. Other evolving small areas
of infarction on the prior study.

Stable chronic/nonemergent findings detailed above.

## 2020-07-19 MED ORDER — PROPOFOL 1000 MG/100ML IV EMUL: 5.0000 ug/kg/min | INTRAVENOUS | Status: DC

## 2020-07-19 MED ORDER — INSULIN ASPART 100 UNIT/ML IJ SOLN
4.0000 [IU] | INTRAMUSCULAR | Status: DC
  Administered 2020-07-19 – 2020-07-22 (×16): 4 [IU] via SUBCUTANEOUS

## 2020-07-19 MED ORDER — PANTOPRAZOLE SODIUM 40 MG PO PACK
40.0000 mg | PACK | Freq: Every day | ORAL | Status: DC
  Administered 2020-07-19 – 2020-07-25 (×7): 40 mg
  Filled 2020-07-19 (×6): qty 20

## 2020-07-19 MED ORDER — PROPOFOL 1000 MG/100ML IV EMUL
5.0000 ug/kg/min | INTRAVENOUS | Status: AC
  Administered 2020-07-19: 5 ug/kg/min via INTRAVENOUS
  Filled 2020-07-19 (×2): qty 100

## 2020-07-19 NOTE — Progress Notes (Signed)
[facility] Progress Note   Subjective:    Pt getting HD this afternoon.     Objective Vitals:   07/19/20 1430 07/19/20 1445 07/19/20 1531 07/19/20 1722  BP:  115/68    Pulse: 95 91    Resp: 15 (!) 34    Temp:   98.1 F (36.7 C)   TempSrc:   Axillary   SpO2: 96% 95%  100%  Weight:      Height:       Physical Exam  on vent ,sedated  no jvd  throat ett in place  Chest cta bilat and lat  Cor reg no RG  Abd soft ntnd no ascites   Ext bilat BKA, no edema   Neuro on vent and sedated, not following commands Access RUE AVF bruit and thrill   Additional Objective Labs: Basic Metabolic Panel: Recent Labs  Lab 07/15/20 0352 07/15/20 1603 07/16/20 0317 07/17/20 0336 07/18/20 0346 07/18/20 1212 07/19/20 0329  NA 136  --    < > 137 138 138 139  K 4.3  --    < > 3.9 4.1 3.9 4.0  CL 97*  --    < > 98 96*  --  98  CO2 25  --    < > 28 22  --  23  GLUCOSE 169*  --    < > 280* 310*  --  228*  BUN 32*  --    < > 42* 65*  --  84*  CREATININE 6.84*  --    < > 6.52* 8.06*  --  9.57*  CALCIUM 9.9  --    < > 10.2 10.9*  --  10.9*  PHOS 6.4* 7.2*  --   --  7.1*  --   --    < > = values in this interval not displayed.    Recent Labs  Lab 07/13/20 1549 07/13/20 2148 07/14/20 1008 07/14/20 1822  AST 20  --  18 20  ALT 15  --  13 13  ALKPHOS 88  --  75 71  BILITOT 1.0  --  1.4* 0.8  PROT 7.7  --  7.2 7.0  ALBUMIN 3.8 3.8 3.5 3.4*    CBC: Recent Labs  Lab 07/13/20 1549 07/13/20 1639 07/14/20 1008 07/14/20 1112 07/14/20 1822 07/15/20 0352 07/16/20 0317 07/17/20 0336 07/18/20 0346 07/18/20 1212 07/19/20 0329  WBC 6.9   < > 9.2  --  10.9* 9.5 7.3 7.7 7.4  --  10.4  NEUTROABS 4.1  --  5.8  --  5.3  --   --   --   --   --   --   HGB 11.9*   < > 10.8*   < > 11.2* 10.2* 11.4* 11.8* 12.4* 12.9* 11.7*  HCT 38.5*   < > 33.8*   < > 35.1* 33.4* 36.1* 37.5* 38.1* 38.0* 36.6*  MCV 96.7   < > 94.7  --  95.4 96.3 95.3 94.9 92.7  --  94.1  PLT 80*   < > 82*   --  104* 94* 82* 97* 127*  --  158   < > = values in this interval not displayed.     Medications:  sodium chloride     sodium chloride Stopped (07/19/20 1252)   feeding supplement (NEPRO CARB STEADY) 45 mL/hr at 07/18/20 1307   norepinephrine (LEVOPHED) Adult infusion Stopped (07/19/20 1042)   piperacillin-tazobactam (ZOSYN)  IV 100 mL/hr at 07/19/20 1300   propofol (  DIPRIVAN) infusion 5 mcg/kg/min (07/19/20 1604)    aspirin  81 mg Per Tube Daily   atorvastatin  40 mg Per Tube Daily   carvedilol  6.25 mg Per Tube BID WC   chlorhexidine gluconate (MEDLINE KIT)  15 mL Mouth Rinse BID   Chlorhexidine Gluconate Cloth  6 each Topical Q1200   docusate  100 mg Per Tube BID   feeding supplement (PROSource TF)  45 mL Per Tube TID   heparin injection (subcutaneous)  5,000 Units Subcutaneous Q8H   insulin aspart  0-15 Units Subcutaneous Q4H   insulin aspart  4 Units Subcutaneous Q4H   insulin glargine  10 Units Subcutaneous Daily   lanthanum  500 mg Per Tube TID WC   mouth rinse  15 mL Mouth Rinse 10 times per day   pantoprazole sodium  40 mg Per Tube Daily   polyethylene glycol  17 g Per Tube Daily   sodium chloride flush  10-40 mL Intracatheter Q12H    Dialysis: MWF [place]  4h   68kg  2/2.5 bath  AVF  P2  Hep none - Hecoral 34mg Iv q HD  Assessment/Plan: # Hypertensive Urgency: mostly resolved, BP's normal to low normal today  # Hyperkalemia: improved , switched to nepro  # Acute hypoxic resp failure. Intubated 7/7.  Then self-extubated on 7/9 and reintubated today 7/11. Per CCM.   # ESRD:  HD per MWF schedule. Having HD today, rolled over from yesterday. Plan HD tomorrow as well.   # Bacteremia - Blood cx's 1/2 + for staph hominis on 7/06.  Had LP on 7/7 with possible meningitis/encephalitis. On IV zosyn now  # Anemia of CKD no ESA needs. monitor trends  # Secondary hyperparathyroidism: hold VDRA for calcium for now.  Hyperphos - resumed home renvela. Switched to nepro for  feeds  # T2DM - per primary team  # Hx ICH - head CT without acute abnormality multifactorial.   # ?Mediastinal mass: CT malignancy cannot be excluded per CT; also with PNA per CT  RSol Blazing MD 07/19/2020,5:45 PM [facility]
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
## 2020-07-19 NOTE — Procedures (Addendum)
Patient Name: Jorge [name]  MRN: [id number]  Epilepsy Attending: Lora Havens  Referring Physician/Provider: Dr Lesleigh Noe Duration: 27/11/2020 1122 to 07/19/2020 1257   Patient history: 50 year old male with altered mental status.  EEG evaluate for seizures.   Level of alertness: comatose   AEDs during EEG study: None   Technical aspects: This EEG study was done with scalp electrodes positioned according to the 10-20 International system of electrode placement. Electrical activity was acquired at a sampling rate of '500Hz'$  and reviewed with a high frequency filter of '70Hz'$  and a low frequency filter of '1Hz'$ . EEG data were recorded continuously and digitally stored.   Description: EEG showed continuous generalized and lateralized left hemisphere 3 to 5 Hz high amplitude theta-delta slowing with triphasic morphology which at times appeared sharply contoured and rhythmic. Bilateral frontocentral 15 to 18 Hz beta activity was also noted. Hyperventilation and photic stimulation were not performed.      ABNORMALITY - Continuous slow, generalized and lateralized left hemisphere   IMPRESSION: This study is suggestive of cortical dysfunction in left hemisphere likely secondary to underlying structural abnormality. There is also severe diffuse encephalopathy, nonspecific etiology. No seizures or definite epileptiform discharges were seen throughout the recording.   [name] [name]

## 2020-07-19 NOTE — Progress Notes (Signed)
LTM discontinued; no skin breakdown was seen. 

## 2020-07-19 NOTE — Progress Notes (Signed)
Neurology Progress Note  Patient ID: Jorge [name] is a 50 y.o.  with a past medical history significant for hypertension, hyperlipidemia, diabetes, end-stage renal disease on peritoneal dialysis, right basal ganglia ICH with IVH and subarachnoid hemorrhage (hypertensive bleed, 10/03/2019), bilateral BKA as detailed above who has had a recent complicated hospital course, with initial improvement in mental status followed by substantial decline this morning.  Given gaze deviation, obtundation, and generalized weakness, CTA was necessary to rule out basilar occlusion.  Despite prior negative EEGs, given the gaze preference observed, which was mid intermittent and rhythmic at times, and also occasionally alternating, concern for seizure activity.  Patient may also have suffered additional strokes, but will hold off on MRI to complete EEG first given management of ongoing seizures and is more acute.  Certainly toxic/metabolic processes could also be lowering his seizure threshold, and I appreciate CCM team and nephrology managing his diabetes, end-stage renal disease and other comorbidities.  Hospital course: He presented to the ED on 07/13/2020 with confusion during dialysis (incomplete session secondary to him pulling at lines), found to be volume overloaded, with labile blood pressures (initially normotensive, but hypertensive into the 200s with worsening altered mental status).  Due to continued agitation he was intubated to facilitate work-up for his altered mental status including MRI, LP and EEG   1 out of 2 blood cultures on admission were positive for Staph hominis, possibly contaminant He was briefly febrile on admission, and was treated with broad-spectrum antibiotics (eventually meningitis coverage with acyclovir, ceftriaxone, vancomycin), then narrowed to ceftriaxone. Work-up included lumbar puncture completed 7/7 WBC 16/19, RBC 1760/595 with a neutrophilic predominance, glucose 117 (serum glucose  171), protein 41             HSV 1/2 PCR were negative, cryptococcal antigen was negative, bacterial culture was negative HIV is negative CT chest 07/14/2020 was notable for right lower lobe opacity concerning for pneumonia, possibly secondary to aspiration and enlarged right paratracheal and precarinal adenopathy, with differential including infection, inflammation/malignancy Routine EEG 07/15/2020 showing continuous slow activity, generalized MRI brain demonstrated multiple small CVAs (7 mm in the left cerebellum, punctate right inferior posterior parietal white matter, 2 linear acute infarctions in the left frontal white matter) felt to be related to concurrent small vessel disease and blood pressure swings during hemodialysis, for which neurology was not formally consulted TTE was negative for vegetations and showed normal EF Although he continued to be encephalopathic he self extubated on 7/9 and by 7/10 was doing well on room air, following commands intermittently Neurology was involved for code stroke on 7/11 after he became encephalopathic with left gaze deviation   Subjective: - Not interactive  Exam: Vitals:   07/19/20 0730 07/19/20 0745  BP: 118/83 124/72  Pulse: 86 87  Resp: 20 (!) 22  Temp:  98 F (36.7 C)  SpO2: 100% 100%   Gen: In bed, comfortable at rest, hooked up to dialysis this morning Resp: Intubated, coughing intermittently on the vent Cardiac: Perfusing extremities well  Abd: soft, nt Musculoskeletal: Stable bilateral BKA's  Neuro: MS: Spontaneously moving all 4 extremities slightly in bed at the beginning of examination.  He does not open eyes to noxious stimulus.  He does not follow any commands.  No clear attempts at any communication. CN: Pupils are sluggishly reactive 3 mm to 2 mm, he has initially of right gaze preference with past rhythmic movements to the right, drifting fully to midline.  Occasionally does cross fully to the left.  Later has some ocular  bobbing with downgaze.  Then again has some rhythmic right gaze preference.  Corneal slightly weaker on the left than the right.  Cough and gag are intact Motor/sensory: Purposeful with the left upper extremity, reaching towards the tube.  Right upper extremity flexor posturing versus withdrawal to noxious stimulation.  Does move the bilateral lower extremities spontaneously but not to noxious stimulation  Pertinent Labs: BUN has increased to 84, anion gap remains elevated at 18, glucose [id number]   Basic Metabolic Panel: Recent Labs  Lab 07/13/20 1946 07/13/20 2148 07/14/20 1008 07/14/20 1112 07/14/20 1822 07/15/20 0352 07/15/20 1603 07/16/20 0317 07/17/20 0336 07/18/20 0346 07/18/20 1212 07/19/20 0329  NA 133*   < > 135   < > 136 136  --  134* 137 138 138 139  K 6.4*   < > 4.1   < > 3.9 4.3  --  3.6 3.9 4.1 3.9 4.0  CL 97*   < > 96*  --  97* 97*  --  94* 98 96*  --  98  CO2 21*   < > 24  --  27 25  --  '27 28 22  '$ --  23  GLUCOSE 306*   < > 179*  --  69* 169*  --  209* 280* 310*  --  228*  BUN 37*   < > 18  --  26* 32*  --  17 42* 65*  --  84*  CREATININE 8.62*   < > 5.31*  --  6.20* 6.84*  --  4.40* 6.52* 8.06*  --  9.57*  CALCIUM 9.5   < > 9.3  --  9.9 9.9  --  9.3 10.2 10.9*  --  10.9*  MG 2.3  --  2.1  --  2.2 2.0 2.3  --   --   --   --   --   PHOS 6.9*   < > 5.1*  --  5.9* 6.4* 7.2*  --   --  7.1*  --   --    < > = values in this interval not displayed.    CBC: Recent Labs  Lab 07/13/20 1549 07/13/20 1639 07/14/20 1008 07/14/20 1112 07/14/20 1822 07/15/20 0352 07/16/20 0317 07/17/20 0336 07/18/20 0346 07/18/20 1212 07/19/20 0329  WBC 6.9   < > 9.2  --  10.9* 9.5 7.3 7.7 7.4  --  10.4  NEUTROABS 4.1  --  5.8  --  5.3  --   --   --   --   --   --   HGB 11.9*   < > 10.8*   < > 11.2* 10.2* 11.4* 11.8* 12.4* 12.9* 11.7*  HCT 38.5*   < > 33.8*   < > 35.1* 33.4* 36.1* 37.5* 38.1* 38.0* 36.6*  MCV 96.7   < > 94.7  --  95.4 96.3 95.3 94.9 92.7  --  94.1  PLT 80*   < > 82*   --  104* 94* 82* 97* 127*  --  158   < > = values in this interval not displayed.    Coagulation Studies: No results for input(s): LABPROT, INR in the last 72 hours.    Impression: Patient still has a significant anion gap and worsening metabolic parameters such as BUN.  Fertile nephrology, hopefully mental status may improve as the metabolic derangements are addressed through dialysis.   Examination remains consistent with diffuse cortical dysfunction as well as possible brainstem dysfunction given  the eye-movement abnormalities.  EEG read pending.  If this is negative despite continued eye movements, will discontinue LTM, and plan for repeat MRI brain to evaluate for the potential of additional strokes contributing to his current mental status and brainstem dysfunction.  Of note initial lumbar puncture was consistent with an aseptic meningitis.  If both EEG and MRI brain without contrast are negative, may need to consider repeating lumbar puncture for additional evaluation including cytopathology  Recommendations: -EEG read pending -If negative EEG, will obtain MRI brain, without contrast to evaluate for additional potential strokes -Appreciate CCM team management of mental eater and other comorbidities. -Neurology will continue to follow  [place] [phone]   CRITICAL CARE Performed by: Lorenza Chick   Total critical care time: 40 minutes  Critical care time was exclusive of separately billable procedures and treating other patients.  Critical care was necessary to treat or prevent imminent or life-threatening deterioration.  Critical care was time spent personally by me on the following activities: development of treatment plan with patient and/or surrogate as well as nursing, discussions with consultants, evaluation of patient's response to treatment, examination of patient, obtaining history from patient or surrogate, ordering and  performing treatments and interventions, ordering and review of laboratory studies, ordering and review of radiographic studies, pulse oximetry and re-evaluation of patient's condition.

## 2020-07-19 NOTE — Progress Notes (Signed)
Patient transported to MRI & back to room 3M06 on the ventilator with no problems. 

## 2020-07-20 DIAGNOSIS — N186 End stage renal disease: Secondary | ICD-10-CM | POA: Diagnosis not present

## 2020-07-20 DIAGNOSIS — Z992 Dependence on renal dialysis: Secondary | ICD-10-CM | POA: Diagnosis not present

## 2020-07-20 DIAGNOSIS — I6389 Other cerebral infarction: Secondary | ICD-10-CM | POA: Diagnosis not present

## 2020-07-20 DIAGNOSIS — R410 Disorientation, unspecified: Secondary | ICD-10-CM | POA: Diagnosis not present

## 2020-07-20 DIAGNOSIS — G9340 Encephalopathy, unspecified: Secondary | ICD-10-CM | POA: Diagnosis not present

## 2020-07-20 DIAGNOSIS — R401 Stupor: Secondary | ICD-10-CM | POA: Diagnosis not present

## 2020-07-20 LAB — GLUCOSE, CAPILLARY
Glucose-Capillary: 155 mg/dL — ABNORMAL HIGH (ref 70–99)
Glucose-Capillary: 137 mg/dL — ABNORMAL HIGH (ref 70–99)
Glucose-Capillary: 106 mg/dL — ABNORMAL HIGH (ref 70–99)
Glucose-Capillary: 128 mg/dL — ABNORMAL HIGH (ref 70–99)
Glucose-Capillary: 180 mg/dL — ABNORMAL HIGH (ref 70–99)
Glucose-Capillary: 214 mg/dL — ABNORMAL HIGH (ref 70–99)

## 2020-07-20 LAB — BASIC METABOLIC PANEL
Anion gap: 17 — ABNORMAL HIGH (ref 5–15)
BUN: 55 mg/dL — ABNORMAL HIGH (ref 6–20)
CO2: 25 mmol/L (ref 22–32)
Calcium: 10.4 mg/dL — ABNORMAL HIGH (ref 8.9–10.3)
Chloride: 98 mmol/L (ref 98–111)
Creatinine, Ser: 7.06 mg/dL — ABNORMAL HIGH (ref 0.61–1.24)
GFR, Estimated: 9 mL/min — ABNORMAL LOW (ref >60)
Glucose, Bld: 166 mg/dL — ABNORMAL HIGH (ref 70–99)
Potassium: 4 mmol/L (ref 3.5–5.1)
Sodium: 140 mmol/L (ref 135–145)

## 2020-07-20 LAB — HEPATITIS C ANTIBODY: HCV Ab: NONREACTIVE

## 2020-07-20 LAB — RPR: RPR Ser Ql: NONREACTIVE

## 2020-07-20 LAB — HEPATITIS B SURFACE ANTIGEN: Hepatitis B Surface Ag: NONREACTIVE

## 2020-07-20 LAB — MAGNESIUM: Magnesium: 2.8 mg/dL — ABNORMAL HIGH (ref 1.7–2.4)

## 2020-07-20 NOTE — Progress Notes (Signed)
[facility] Progress Note Patient Name: Jorge [name] DOB: [birth year] MRN: [id number]   Date of Service  07/20/2020  HPI/Events of Note  Patient with frequent lose stools.   eICU Interventions  Flexiseal ordered.        Kerry Kass Lenford Beddow 07/20/2020, 11:17 PM
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
## 2020-07-20 NOTE — Progress Notes (Signed)
Neurology Progress Note  Patient ID: Jorge [name] is a 50 y.o.  with a past medical history significant for hypertension, hyperlipidemia, diabetes, end-stage renal disease on peritoneal dialysis, right basal ganglia ICH with IVH and subarachnoid hemorrhage (hypertensive bleed, 10/03/2019), bilateral BKA as detailed above who has had a recent complicated hospital course, with initial improvement in mental status followed by substantial decline this morning.  Given gaze deviation, obtundation, and generalized weakness, CTA was necessary to rule out basilar occlusion.  Despite prior negative EEGs, given the gaze preference observed, which was mid intermittent and rhythmic at times, and also occasionally alternating, concern for seizure activity.  Patient may also have suffered additional strokes, but will hold off on MRI to complete EEG first given management of ongoing seizures and is more acute.  Certainly toxic/metabolic processes could also be lowering his seizure threshold, and I appreciate CCM team and nephrology managing his diabetes, end-stage renal disease and other comorbidities.  Hospital course: He presented to the ED on 07/13/2020 with confusion during dialysis (incomplete session secondary to him pulling at lines), found to be volume overloaded, with labile blood pressures (initially normotensive, but hypertensive into the 200s with worsening altered mental status).  Due to continued agitation he was intubated to facilitate work-up for his altered mental status including MRI, LP and EEG   1 out of 2 blood cultures on admission were positive for Staph hominis, possibly contaminant He was briefly febrile on admission, and was treated with broad-spectrum antibiotics (eventually meningitis coverage with acyclovir, ceftriaxone, vancomycin), then narrowed to ceftriaxone. Work-up included lumbar puncture completed 7/7 WBC 16/19, RBC 1760/595 with a neutrophilic predominance, glucose 117 (serum glucose  171), protein 41             HSV 1/2 PCR were negative, cryptococcal antigen was negative, bacterial culture was negative HIV is negative CT chest 07/14/2020 was notable for right lower lobe opacity concerning for pneumonia, possibly secondary to aspiration and enlarged right paratracheal and precarinal adenopathy, with differential including infection, inflammation/malignancy Routine EEG 07/15/2020 showing continuous slow activity, generalized MRI brain demonstrated multiple small CVAs (7 mm in the left cerebellum, punctate right inferior posterior parietal white matter, 2 linear acute infarctions in the left frontal white matter) felt to be related to concurrent small vessel disease and blood pressure swings during hemodialysis, for which neurology was not formally consulted TTE was negative for vegetations and showed normal EF Although he continued to be encephalopathic he self extubated on 7/9 and by 7/10 was doing well on room air, following commands intermittently Neurology was involved for code stroke on 7/11 after he became encephalopathic with left gaze deviation Has been requiring some Levophed since, at low doses, and has been continued on piperacillin tazobactam which was started on 7/11  Subjective/interval events: - Not interactive - EEG with encephalopathy, no epileptogenic activity - MRI with at least one additional stroke - Briefly received propofol yesterday but otherwise has received minimal sedation  Exam: Vitals:   07/20/20 0615 07/20/20 0630  BP: (!) 87/51 102/67  Pulse: 85 85  Resp: 20 18  Temp:    SpO2: 100% 100%   Gen: In bed, comfortable at rest, Resp: Intubated, comfortable on the vent today Cardiac: Perfusing extremities well  Abd: soft, nt Musculoskeletal: Stable bilateral BKA's  Neuro: MS: Not moving anything spontaneously today.  He does blink more readily today when I am examining his gaze.  He does not follow any commands.  No attempts at any  communication. CN: Pupils  are sluggishly reactive 3 mm to 2 mm, he has initially right gaze preference, drifting to midline.  Only crosses to the left with VOR today.  Later has some ocular bobbing with downgaze.  Corneals appear weaker today, cough and gag intact per respiratory therapy Motor/sensory: Purposeful with the RIGHT upper extremity, reaching towards the tube.  Otherwise no response to maximal noxious stimulus in all the other 3 extremities  Pertinent Labs: BUN has increased to 84, anion gap remains elevated at 18, glucose [id number]   Basic Metabolic Panel: Recent Labs  Lab 07/14/20 1008 07/14/20 1112 07/14/20 1822 07/15/20 0352 07/15/20 1603 07/16/20 0317 07/17/20 0336 07/18/20 0346 07/18/20 1212 07/19/20 0329 07/20/20 0341  NA 135   < > 136 136  --  134* 137 138 138 139 140  K 4.1   < > 3.9 4.3  --  3.6 3.9 4.1 3.9 4.0 4.0  CL 96*  --  97* 97*  --  94* 98 96*  --  98 98  CO2 24  --  27 25  --  '27 28 22  '$ --  23 25  GLUCOSE 179*  --  69* 169*  --  209* 280* 310*  --  228* 166*  BUN 18  --  26* 32*  --  17 42* 65*  --  84* 55*  CREATININE 5.31*  --  6.20* 6.84*  --  4.40* 6.52* 8.06*  --  9.57* 7.06*  CALCIUM 9.3  --  9.9 9.9  --  9.3 10.2 10.9*  --  10.9* 10.4*  MG 2.1  --  2.2 2.0 2.3  --   --   --   --   --  2.8*  PHOS 5.1*  --  5.9* 6.4* 7.2*  --   --  7.1*  --   --   --    < > = values in this interval not displayed.    CBC: Recent Labs  Lab 07/13/20 1549 07/13/20 1639 07/14/20 1008 07/14/20 1112 07/14/20 1822 07/15/20 0352 07/16/20 0317 07/17/20 0336 07/18/20 0346 07/18/20 1212 07/19/20 0329  WBC 6.9   < > 9.2  --  10.9* 9.5 7.3 7.7 7.4  --  10.4  NEUTROABS 4.1  --  5.8  --  5.3  --   --   --   --   --   --   HGB 11.9*   < > 10.8*   < > 11.2* 10.2* 11.4* 11.8* 12.4* 12.9* 11.7*  HCT 38.5*   < > 33.8*   < > 35.1* 33.4* 36.1* 37.5* 38.1* 38.0* 36.6*  MCV 96.7   < > 94.7  --  95.4 96.3 95.3 94.9 92.7  --  94.1  PLT 80*   < > 82*  --  104* 94* 82* 97* 127*   --  158   < > = values in this interval not displayed.    Coagulation Studies: No results for input(s): LABPROT, INR in the last 72 hours.    Impression: Metabolic parameters are slowly improving, however patient remains obtunded with his examination out of proportion to the small strokes on his imaging.  Examination remains consistent with diffuse cortical dysfunction as well as possible brainstem dysfunction given the eye-movement abnormalities, unclear to me why he has so much fluctuation in gaze as well as which extremities are purposeful, but will follow clinical examination for now.  Of note initial lumbar puncture was consistent with an aseptic meningitis. This can be seen purely  in the setting of stroke, but given further stroke, need to consider etiology such as vasculitis, which can be driven by infections, will further workup as below. Possibility of endocarditis with continued potentially embolic strokes should be considered   Recommendations: -Serum studies: HepB, HepC, RPR, T-spot -CSF studies added on  -- VZV IgG and CMV -Repeat blood cultures -TEE soon as possible -Appreciate CCM team management of ventilator and other comorbidities. -Neurology will continue to follow  [place] [phone]   CRITICAL CARE Performed by: Lorenza Chick   Total critical care time: 40 minutes  Critical care time was exclusive of separately billable procedures and treating other patients.  Critical care was necessary to treat or prevent imminent or life-threatening deterioration.  Critical care was time spent personally by me on the following activities: development of treatment plan with patient and/or surrogate as well as nursing, discussions with consultants, evaluation of patient's response to treatment, examination of patient, obtaining history from patient or surrogate, ordering and performing treatments and interventions, ordering and  review of laboratory studies, ordering and review of radiographic studies, pulse oximetry and re-evaluation of patient's condition.

## 2020-07-20 NOTE — Progress Notes (Signed)
    [facility] has been requested to perform a transesophageal echocardiogram on Trellis Tollie Eth for bacteremia.  After careful review of history and examination, the risks and benefits of transesophageal echocardiogram have been explained to the family of [name] due to patient intubation including risks of esophageal damage, perforation (1:10,000 risk), bleeding, pharyngeal hematoma as well as other potential complications associated with conscious sedation including aspiration, arrhythmia, respiratory failure and death. Alternatives to treatment were discussed, questions were answered. Patient's family is willing to proceed.   Kathyrn Drown, NP  07/20/2020 6:16 PM

## 2020-07-20 NOTE — Progress Notes (Signed)
[facility] Progress Note   Subjective:    Had HD yest and on again this am.  BP's labile this am.     Objective Vitals:   07/20/20 1100 07/20/20 1120 07/20/20 1129 07/20/20 1130  BP: 125/84  (!) 164/66 (!) 164/66  Pulse:   87   Resp:   (!) 24   Temp:  98 F (36.7 C)    TempSrc:  Axillary    SpO2:   100%   Weight:      Height:       Physical Exam  on vent ,sedated  no jvd  throat ett in place  Chest cta bilat and lat  Cor reg no RG  Abd soft ntnd no ascites   Ext bilat BKA, no edema   Neuro on vent and sedated, not following commands Access RUE AVF bruit and thrill   Additional Objective Labs: Basic Metabolic Panel: Recent Labs  Lab 07/15/20 0352 07/15/20 1603 07/16/20 0317 07/18/20 0346 07/18/20 1212 07/19/20 0329 07/20/20 0341  NA 136  --    < > 138 138 139 140  K 4.3  --    < > 4.1 3.9 4.0 4.0  CL 97*  --    < > 96*  --  98 98  CO2 25  --    < > 22  --  23 25  GLUCOSE 169*  --    < > 310*  --  228* 166*  BUN 32*  --    < > 65*  --  84* 55*  CREATININE 6.84*  --    < > 8.06*  --  9.57* 7.06*  CALCIUM 9.9  --    < > 10.9*  --  10.9* 10.4*  PHOS 6.4* 7.2*  --  7.1*  --   --   --    < > = values in this interval not displayed.    Recent Labs  Lab 07/13/20 1549 07/13/20 2148 07/14/20 1008 07/14/20 1822  AST 20  --  18 20  ALT 15  --  13 13  ALKPHOS 88  --  75 71  BILITOT 1.0  --  1.4* 0.8  PROT 7.7  --  7.2 7.0  ALBUMIN 3.8 3.8 3.5 3.4*    CBC: Recent Labs  Lab 07/13/20 1549 07/13/20 1639 07/14/20 1008 07/14/20 1112 07/14/20 1822 07/15/20 0352 07/16/20 0317 07/17/20 0336 07/18/20 0346 07/18/20 1212 07/19/20 0329  WBC 6.9   < > 9.2  --  10.9* 9.5 7.3 7.7 7.4  --  10.4  NEUTROABS 4.1  --  5.8  --  5.3  --   --   --   --   --   --   HGB 11.9*   < > 10.8*   < > 11.2* 10.2* 11.4* 11.8* 12.4* 12.9* 11.7*  HCT 38.5*   < > 33.8*   < > 35.1* 33.4* 36.1* 37.5* 38.1* 38.0* 36.6*  MCV 96.7   < > 94.7  --  95.4 96.3 95.3 94.9  92.7  --  94.1  PLT 80*   < > 82*  --  104* 94* 82* 97* 127*  --  158   < > = values in this interval not displayed.     Medications:  sodium chloride     sodium chloride Stopped (07/20/20 0534)   feeding supplement (NEPRO CARB STEADY) 1,000 mL (07/20/20 0546)   norepinephrine (LEVOPHED) Adult infusion 3 mcg/min (07/20/20 0805)   piperacillin-tazobactam (ZOSYN)  IV 100 mL/hr at 07/20/20 0600    aspirin  81 mg Per Tube Daily   atorvastatin  40 mg Per Tube Daily   carvedilol  6.25 mg Per Tube BID WC   chlorhexidine gluconate (MEDLINE KIT)  15 mL Mouth Rinse BID   Chlorhexidine Gluconate Cloth  6 each Topical Q1200   docusate  100 mg Per Tube BID   feeding supplement (PROSource TF)  45 mL Per Tube TID   heparin injection (subcutaneous)  5,000 Units Subcutaneous Q8H   insulin aspart  0-15 Units Subcutaneous Q4H   insulin aspart  4 Units Subcutaneous Q4H   insulin glargine  10 Units Subcutaneous Daily   lanthanum  500 mg Per Tube TID WC   mouth rinse  15 mL Mouth Rinse 10 times per day   pantoprazole sodium  40 mg Per Tube Daily   polyethylene glycol  17 g Per Tube Daily   sodium chloride flush  10-40 mL Intracatheter Q12H    Dialysis: MWF [facility]  4h   68kg  2/2.5 bath  AVF  P2  Hep none - Hecoral 55mg Iv q HD  Assessment/Plan:  # AMS - toxic/ metabolic encephalopathy, DDx seizures, DKA, delirium, uremia. Back to back HD yest and today should resolve any current uremia.    # Acute hypoxic resp failure. Intubated 7/7, then self-extubated on 7/9 and reintubated today 7/11. Per CCM.   # Hypertensive Urgency: mostly resolved, BP's normal to low normal today  # ESRD:  HD per MWF schedule. Had HD yest off schedule and getting HD today to get back on MWF schedule.   # Bacteremia - Blood cx's 1/2 + for staph hominis on 7/06.  Had LP on 7/7 with possible meningitis/encephalitis. On IV zosyn now  # Anemia of CKD no ESA needs. monitor trends  # Secondary hyperparathyroidism: hold VDRA  for calcium for now.  Hyperphos - resumed home renvela. Switched to nepro for feeds  # T2DM - per primary team  # Hx ICH - head CT without acute abnormality multifactorial.   # ?Mediastinal mass: CT malignancy cannot be excluded per CT; also with PNA per CT  RSol Blazing MD 07/20/2020,11:59 AM [facility]

## 2020-07-21 ENCOUNTER — Inpatient Hospital Stay (HOSPITAL_COMMUNITY): Payer: Medicare Other

## 2020-07-21 DIAGNOSIS — Q211 Atrial septal defect: Secondary | ICD-10-CM

## 2020-07-21 DIAGNOSIS — R401 Stupor: Secondary | ICD-10-CM | POA: Diagnosis not present

## 2020-07-21 DIAGNOSIS — I639 Cerebral infarction, unspecified: Secondary | ICD-10-CM

## 2020-07-21 DIAGNOSIS — R410 Disorientation, unspecified: Secondary | ICD-10-CM | POA: Diagnosis not present

## 2020-07-21 DIAGNOSIS — G9340 Encephalopathy, unspecified: Secondary | ICD-10-CM | POA: Diagnosis not present

## 2020-07-21 DIAGNOSIS — N186 End stage renal disease: Secondary | ICD-10-CM | POA: Diagnosis not present

## 2020-07-21 DIAGNOSIS — Z992 Dependence on renal dialysis: Secondary | ICD-10-CM | POA: Diagnosis not present

## 2020-07-21 LAB — GLUCOSE, CAPILLARY
Glucose-Capillary: 120 mg/dL — ABNORMAL HIGH (ref 70–99)
Glucose-Capillary: 102 mg/dL — ABNORMAL HIGH (ref 70–99)
Glucose-Capillary: 146 mg/dL — ABNORMAL HIGH (ref 70–99)
Glucose-Capillary: 75 mg/dL (ref 70–99)
Glucose-Capillary: 119 mg/dL — ABNORMAL HIGH (ref 70–99)
Glucose-Capillary: 165 mg/dL — ABNORMAL HIGH (ref 70–99)

## 2020-07-21 LAB — BASIC METABOLIC PANEL
Anion gap: 12 (ref 5–15)
BUN: 41 mg/dL — ABNORMAL HIGH (ref 6–20)
CO2: 27 mmol/L (ref 22–32)
Calcium: 9.7 mg/dL (ref 8.9–10.3)
Chloride: 101 mmol/L (ref 98–111)
Creatinine, Ser: 5.33 mg/dL — ABNORMAL HIGH (ref 0.61–1.24)
GFR, Estimated: 12 mL/min — ABNORMAL LOW (ref >60)
Glucose, Bld: 100 mg/dL — ABNORMAL HIGH (ref 70–99)
Potassium: 3.7 mmol/L (ref 3.5–5.1)
Sodium: 140 mmol/L (ref 135–145)

## 2020-07-21 LAB — CBC
HCT: 36.3 % — ABNORMAL LOW (ref 39.0–52.0)
Hemoglobin: 11.6 g/dL — ABNORMAL LOW (ref 13.0–17.0)
MCH: 30.8 pg (ref 26.0–34.0)
MCHC: 32 g/dL (ref 30.0–36.0)
MCV: 96.3 fL (ref 80.0–100.0)
Platelets: 182 10*3/uL (ref 150–400)
RBC: 3.77 MIL/uL — ABNORMAL LOW (ref 4.22–5.81)
RDW: 16.8 % — ABNORMAL HIGH (ref 11.5–15.5)
WBC: 13.7 10*3/uL — ABNORMAL HIGH (ref 4.0–10.5)
nRBC: 0 % (ref 0.0–0.2)

## 2020-07-21 LAB — MISC LABCORP TEST (SEND OUT)
LabCorp test name: 54444
Labcorp test code: 9985

## 2020-07-21 IMAGING — DX DG ABD PORTABLE 1V
1 series · 1 of 1 positions shown · non-contrast
Comparison: None.

CLINICAL DATA: Status post feeding catheter placement

EXAM:
PORTABLE ABDOMEN - 1 VIEW

[abdomen supine]
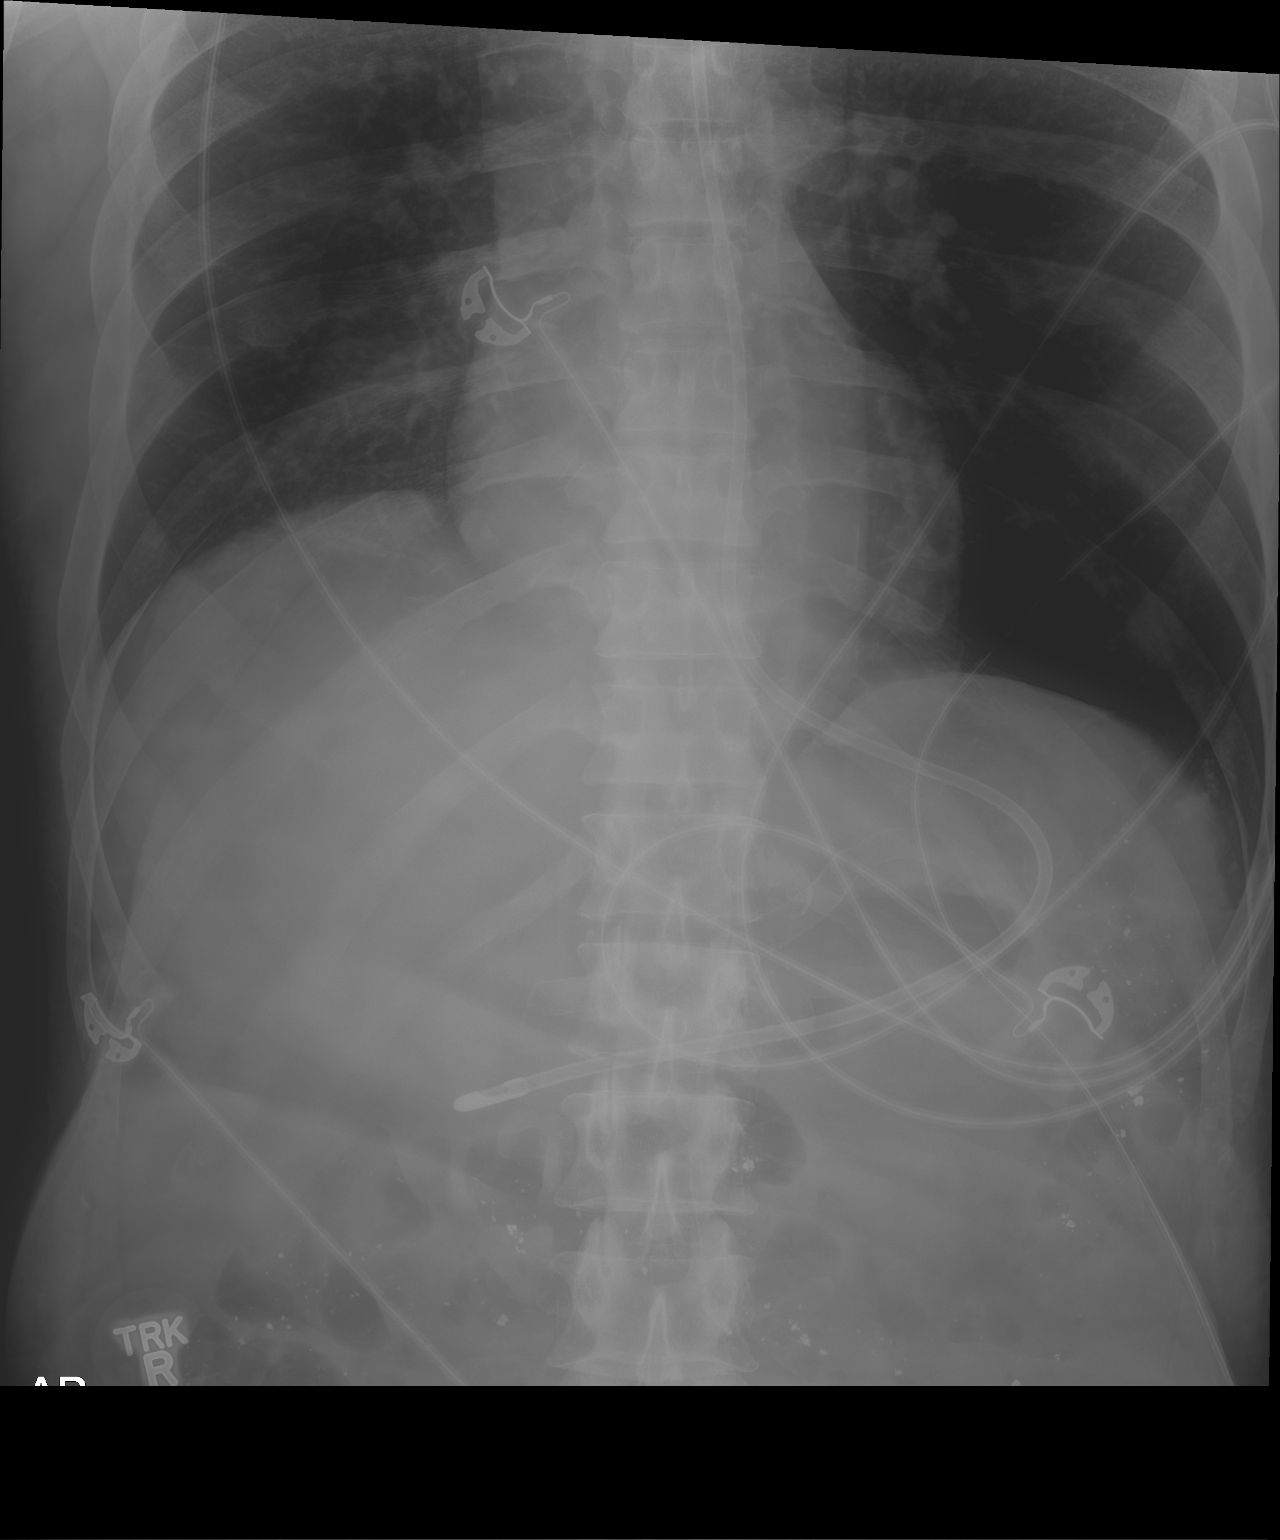

[1 of 1 positions shown; findings below may reference images not displayed]

FINDINGS: Weighted feeding catheter is noted in the distal aspect of the
stomach. Vascular calcifications are seen. No free air is noted.
IMPRESSION: Weighted feeding catheter in the distal stomach.

## 2020-07-21 MED ORDER — FENTANYL CITRATE (PF) 100 MCG/2ML IJ SOLN
25.0000 ug | INTRAMUSCULAR | Status: DC | PRN
Start: 2020-07-21 — End: 2020-07-22
  Administered 2020-07-21: 25 ug via INTRAVENOUS
  Filled 2020-07-21: qty 2

## 2020-07-21 MED ORDER — MIDAZOLAM HCL 2 MG/2ML IJ SOLN
1.0000 mg | INTRAMUSCULAR | Status: DC | PRN
  Administered 2020-07-21: 1 mg via INTRAVENOUS
  Filled 2020-07-21 (×2): qty 2

## 2020-07-21 MED ORDER — SODIUM CHLORIDE 0.9 % IV BOLUS: 500.0000 mL | INTRAVENOUS | Status: DC | PRN

## 2020-07-21 NOTE — Progress Notes (Signed)
Neurology Progress Note  Patient ID: Jorge [name] is a 50 y.o.  with a past medical history significant for hypertension, hyperlipidemia, diabetes, end-stage renal disease on peritoneal dialysis, right basal ganglia ICH with IVH and subarachnoid hemorrhage (hypertensive bleed, 10/03/2019), bilateral BKA as detailed above who has had a recent complicated hospital course, with initial improvement in mental status followed by substantial decline 7/11 AM  Hospital course: He presented to the ED on 07/13/2020 with confusion during dialysis (incomplete session secondary to him pulling at lines), found to be volume overloaded, with labile blood pressures (initially normotensive, but hypertensive into the 200s with worsening altered mental status).  Due to continued agitation he was intubated to facilitate work-up for his altered mental status including MRI, LP and EEG   -1 out of 2 blood cultures on admission were positive for Staph hominis, possibly contaminant -He was briefly febrile on admission, and was treated with broad-spectrum antibiotics (eventually meningitis coverage with acyclovir, ceftriaxone, vancomycin), then narrowed to ceftriaxone. -Work-up included lumbar puncture completed 7/7 WBC 16/19, RBC 1760/595 with a neutrophilic predominance, glucose 117 (serum glucose 171), protein 41             HSV 1/2 PCR were negative, cryptococcal antigen was negative, bacterial culture was negative -HIV is negative, additionally 7/13 Hep B, Hep C and RPR negative -CT chest 07/14/2020 was notable for right lower lobe opacity concerning for pneumonia, possibly secondary to aspiration and enlarged right paratracheal and precarinal adenopathy, with differential including infection, inflammation/malignancy -Routine EEG 07/15/2020 showing continuous slow activity, generalized -MRI brain demonstrated multiple small CVAs (7 mm in the left cerebellum, punctate right inferior posterior parietal white matter, 2 linear acute  infarctions in the left frontal white matter) felt to be related to concurrent small vessel disease and blood pressure swings during hemodialysis, for which neurology was not formally consulted -TTE was negative for vegetations and showed normal EF -Although he continued to be encephalopathic he self extubated on 7/9 and by 7/10 was doing well on room air, following commands intermittently -Neurology was involved for code stroke on 7/11 after he became encephalopathic with left gaze deviation -Has been requiring some Levophed since, at low doses, and has been continued on piperacillin tazobactam which was started on 7/11 - EEG with encephalopathy, no epileptogenic activity 7/11 - 7/12  - MRI with at least one-three additional punctate strokes 7/12  Subjective/interval events: - Febrile overnight with new leukocytosis this morning -However nursing reports that mental status is consistently improving  Exam: Vitals:   07/21/20 0630 07/21/20 0645  BP: (!) 113/50 115/84  Pulse: 87 95  Resp: 20 18  Temp:    SpO2: 99% 98%   Gen: In bed, comfortable at rest, Resp: Intubated, comfortable on the vent today Cardiac: Perfusing extremities well  Abd: soft, nt Musculoskeletal: Stable bilateral BKA's  Neuro: MS: Opens eyes to voice.  Follows commands in all 4 extremities (moves each leg and turn to command, shows a thumbs up with his right hand, squeezes to command on the left hand).  He is slow to follow commands but clearly does so. CN: Tracks examiner in all directions, and external ocular movements appear intact.  Upper face is symmetric.  Motor: At least 3/5 in the bilateral lower extremities, bilateral upper extremities left in restraints given patient is at high risk of repeated self extubation based on prior history, but as noted above he does follow commands with bilateral hands.   Sensory: Equally reactive to light touch in all  4 extremities  Pertinent Labs: BUN has increased to 84,  anion gap remains elevated at 18, glucose XX123456   Basic Metabolic Panel: Recent Labs  Lab 07/14/20 1008 07/14/20 1112 07/14/20 1822 07/15/20 0352 07/15/20 1603 07/16/20 0317 07/17/20 0336 07/18/20 0346 07/18/20 1212 07/19/20 0329 07/20/20 0341 07/21/20 0437  NA 135   < > 136 136  --    < > 137 138 138 139 140 140  K 4.1   < > 3.9 4.3  --    < > 3.9 4.1 3.9 4.0 4.0 3.7  CL 96*  --  97* 97*  --    < > 98 96*  --  98 98 101  CO2 24  --  27 25  --    < > 28 22  --  '23 25 27  '$ GLUCOSE 179*  --  69* 169*  --    < > 280* 310*  --  228* 166* 100*  BUN 18  --  26* 32*  --    < > 42* 65*  --  84* 55* 41*  CREATININE 5.31*  --  6.20* 6.84*  --    < > 6.52* 8.06*  --  9.57* 7.06* 5.33*  CALCIUM 9.3  --  9.9 9.9  --    < > 10.2 10.9*  --  10.9* 10.4* 9.7  MG 2.1  --  2.2 2.0 2.3  --   --   --   --   --  2.8*  --   PHOS 5.1*  --  5.9* 6.4* 7.2*  --   --  7.1*  --   --   --   --    < > = values in this interval not displayed.     CBC: Recent Labs  Lab 07/14/20 1008 07/14/20 1112 07/14/20 1822 07/15/20 0352 07/16/20 0317 07/17/20 0336 07/18/20 0346 07/18/20 1212 07/19/20 0329 07/21/20 0437  WBC 9.2  --  10.9*   < > 7.3 7.7 7.4  --  10.4 13.7*  NEUTROABS 5.8  --  5.3  --   --   --   --   --   --   --   HGB 10.8*   < > 11.2*   < > 11.4* 11.8* 12.4* 12.9* 11.7* 11.6*  HCT 33.8*   < > 35.1*   < > 36.1* 37.5* 38.1* 38.0* 36.6* 36.3*  MCV 94.7  --  95.4   < > 95.3 94.9 92.7  --  94.1 96.3  PLT 82*  --  104*   < > 82* 97* 127*  --  158 182   < > = values in this interval not displayed.     Coagulation Studies: No results for input(s): LABPROT, INR in the last 72 hours.    Impression: Examination is markedly improved today, suggestive of prior toxic/metabolic encephalopathy which is clearing.  Recommendations: -Serum studies: T-spot in process, follow up  -CSF studies added on  -- VZV IgG and CMV in process Given marked improvement in mental status at this point low concern for  ongoing CNS meningitis -Repeat blood cultures from 7/13 pending,  -Further infectious work-up if needed per primary team -Further workup of mediastinal lymphadenopathy if patient declines can -TEE today, planned for approximately 2 PM -Appreciate CCM team management of ventilator and other comorbidities. -Neurology will continue to follow  [place] [phone]   CRITICAL CARE Performed by: Lorenza Chick   Total critical care  time: 35 minutes  Critical care time was exclusive of separately billable procedures and treating other patients.  Critical care was necessary to treat or prevent imminent or life-threatening deterioration.  Critical care was time spent personally by me on the following activities: development of treatment plan with patient and/or surrogate as well as nursing, discussions with consultants, evaluation of patient's response to treatment, examination of patient, obtaining history from patient or surrogate, ordering and performing treatments and interventions, ordering and review of laboratory studies, ordering and review of radiographic studies, pulse oximetry and re-evaluation of patient's condition.

## 2020-07-21 NOTE — Progress Notes (Signed)
Nutrition Follow-up  DOCUMENTATION CODES:   Non-severe (moderate) malnutrition in context of chronic illness  INTERVENTION:   After TEE today, resume tube feeds via Cortrak tube: - Nepro @ 50 ml/hr (1200 ml/day) - ProSource TF 45 ml BID   Tube feeding regimen provides 2240 kcal, 119 grams of protein, and 872 ml of H2O.  NUTRITION DIAGNOSIS:   Moderate Malnutrition related to chronic illness (ERSD) as evidenced by moderate fat depletion, moderate muscle depletion.  Ongoing, being addressed via TF  GOAL:   Patient will meet greater than or equal to 90% of their needs  Met via TF  MONITOR:   Vent status, Labs, Weight trends, TF tolerance, I & O's  REASON FOR ASSESSMENT:   Ventilator, Consult Enteral/tube feeding initiation and management  ASSESSMENT:   50 year old male who presented to the ED on 7/06 with AMS. PMH of ESRD on HD, T2DM, gastroparesis, remote CVA, HTN, bilateral BKAs, ICH, TIA, HLD. Pt admitted with hypertensive urgency, hyperkalemia, hypoxia.  7/07 - intubated, NG tube placed, s/p lumbar puncture 7/08 - Cortrak placed (tip gastric) 7/09 - pt self-extubated 7/11 - intubated  Discussed pt with RN and during ICU rounds. Pt with toxic metabolic encephalopathy. Noted plan for TEE today; tube feeds were held at midnight for TEE. Pt was tolerating tube feeds without issue prior to them being held. Cortrak remains in place.  If Cortrak inadvertently removed during TEE today, recommend nursing placing NG tube at bedside and having Cortrak team replace Cortrak tube tomorrow when service is available.  Pt's last HD was yesterday with no net UF. Pt with episode of hypotension during HD per flowsheet documentation.  Pt interacting with RD during visit. Pt able to shake head yes and no to answer basic questions.  Admit weight: 68.6 kg Current weight: 64.9 kg EDW: 68 kg  Patient is currently intubated on ventilator support MV: 13.8 L/min Temp (24hrs), Avg:99.1  F (37.3 C), Min:98 F (36.7 C), Max:100.1 F (37.8 C) BP (cuff): 89/45 MAP (cuff): 59  Drips: Levophed  Medications reviewed and include: colace, SSI q 4 hours, novolog 4 units q 4 hours, lantus 10 units daily, fosrenol 500 mg TID, protonix  Labs reviewed: magnesium 2.8 CBG's: 102-214 x 24 hours  Diet Order:   Diet Order             Diet NPO time specified  Diet effective now                   EDUCATION NEEDS:   No education needs have been identified at this time  Skin:  Skin Assessment: Reviewed RN Assessment  Last BM:  07/21/20 type 7 via rectal tube  Height:   Ht Readings from Last 1 Encounters:  07/18/20 _0  (1.803 m)    Weight:   Wt Readings from Last 1 Encounters:  07/21/20 64.9 kg    Ideal Body Weight:  68 kg (adjusted for bilateral BKA)  BMI:  Body mass index is 19.96 kg/m.  Adjusted BMI: 21.1 kg/m  Estimated Nutritional Needs:   Kcal:  2100-2300  Protein:  110-130 grams  Fluid:  UOP + 1000 ml    Gustavus Bryant, MS, RD, LDN Inpatient Clinical Dietitian Please see [facility] for contact information.
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
## 2020-07-21 NOTE — Progress Notes (Signed)
[facility] Progress Note   Subjective:    Had HD yest and on again this am.  BP's labile this am.     Objective Vitals:   07/21/20 0734 07/21/20 0806 07/21/20 1117 07/21/20 1144  BP:   (!) 110/40   Pulse:  90 85   Resp:  20 20   Temp: 99.6 F (37.6 C)   99.3 F (37.4 C)  TempSrc: Axillary   Oral  SpO2:  98% 100%   Weight:      Height:       Physical Exam  on vent , wide awake and responsive  no jvd  throat ett in place  Chest cta bilat and lat  Cor reg no RG  Abd soft ntnd no ascites   Ext bilat BKA, no edema   Neuro follows commands today Access RUE AVF bruit and thrill   Additional Objective Labs: Basic Metabolic Panel: Recent Labs  Lab 07/15/20 0352 07/15/20 1603 07/16/20 0317 07/18/20 0346 07/18/20 1212 07/19/20 0329 07/20/20 0341 07/21/20 0437  NA 136  --    < > 138   < > 139 140 140  K 4.3  --    < > 4.1   < > 4.0 4.0 3.7  CL 97*  --    < > 96*  --  98 98 101  CO2 25  --    < > 22  --  23 25 27   GLUCOSE 169*  --    < > 310*  --  228* 166* 100*  BUN 32*  --    < > 65*  --  84* 55* 41*  CREATININE 6.84*  --    < > 8.06*  --  9.57* 7.06* 5.33*  CALCIUM 9.9  --    < > 10.9*  --  10.9* 10.4* 9.7  PHOS 6.4* 7.2*  --  7.1*  --   --   --   --    < > = values in this interval not displayed.    Recent Labs  Lab 07/14/20 1822  AST 20  ALT 13  ALKPHOS 71  BILITOT 0.8  PROT 7.0  ALBUMIN 3.4*    CBC: Recent Labs  Lab 07/14/20 1822 07/15/20 0352 07/16/20 0317 07/17/20 0336 07/18/20 0346 07/18/20 1212 07/19/20 0329 07/21/20 0437  WBC 10.9*   < > 7.3 7.7 7.4  --  10.4 13.7*  NEUTROABS 5.3  --   --   --   --   --   --   --   HGB 11.2*   < > 11.4* 11.8* 12.4* 12.9* 11.7* 11.6*  HCT 35.1*   < > 36.1* 37.5* 38.1* 38.0* 36.6* 36.3*  MCV 95.4   < > 95.3 94.9 92.7  --  94.1 96.3  PLT 104*   < > 82* 97* 127*  --  158 182   < > = values in this interval not displayed.     Medications:  sodium chloride     sodium chloride 10  mL/hr at 07/21/20 0700   feeding supplement (NEPRO CARB STEADY) Stopped (07/21/20 0007)   norepinephrine (LEVOPHED) Adult infusion 2 mcg/min (07/21/20 1355)    aspirin  81 mg Per Tube Daily   atorvastatin  40 mg Per Tube Daily   carvedilol  6.25 mg Per Tube BID WC   chlorhexidine gluconate (MEDLINE KIT)  15 mL Mouth Rinse BID   Chlorhexidine Gluconate Cloth  6 each Topical Q1200   docusate  100 mg Per Tube BID   feeding supplement (PROSource TF)  45 mL Per Tube TID   heparin injection (subcutaneous)  5,000 Units Subcutaneous Q8H   insulin aspart  0-15 Units Subcutaneous Q4H   insulin aspart  4 Units Subcutaneous Q4H   insulin glargine  10 Units Subcutaneous Daily   lanthanum  500 mg Per Tube TID WC   mouth rinse  15 mL Mouth Rinse 10 times per day   pantoprazole sodium  40 mg Per Tube Daily   sodium chloride flush  10-40 mL Intracatheter Q12H    Dialysis: MWF [place]  4h   68kg  2/2.5 bath  AVF  P2  Hep none - Hecoral 53mg Iv q HD  Assessment/Plan:  # AMS - toxic/ metabolic encephalopathy, DDx seizures, staph bacteremia, DKA, delirium, uremia. Has has HD x 2 here. Fully alert today.   # Acute hypoxic resp failure. Intubated 7/7, then self-extubated on 7/9 and reintubated today 7/11. Per CCM.   # Hypertensive Urgency: mostly resolved, BP's normal to low normal today  # Volume - 3 kg under dry wt and looks dry, + soft BP's on low dose levophed. Ordered 500 cc bolus x 3 prn to help get BP's up and off of pressor support.   # ESRD:  HD per MWF schedule. Had HD yest off schedule and getting HD today to get back on MWF schedule.   # Bacteremia - Blood cx's 1/2 + for staph hominis on 7/06.  Had LP on 7/7 with possible meningitis/encephalitis. On IV zosyn now  # Anemia of CKD no ESA needs. monitor trends  # Secondary hyperparathyroidism: hold VDRA for calcium for now.  Hyperphos - resumed home renvela. Switched to nepro for feeds  # T2DM - per primary team  # Hx ICH - head CT  without acute abnormality multifactorial.   # ?Mediastinal mass: CT malignancy cannot be excluded per CT; also with PNA per CT  RSol Blazing MD 07/21/2020,2:06 PM [facility]

## 2020-07-21 NOTE — CV Procedure (Signed)
    TRANSESOPHAGEAL ECHOCARDIOGRAM   NAME:  Jorge [name]    MRN: [id number] DOB:  1970-08-21    ADMIT DATE: 07/13/2020  INDICATIONS: Embolic Stroke  PROCEDURE:   Informed consent was obtained prior to the procedure. The risks, benefits and alternatives for the procedure were discussed and the patient comprehended these risks.  Risks include, but are not limited to, cough, sore throat, vomiting, nausea, somnolence, esophageal and stomach trauma or perforation, bleeding, low blood pressure, aspiration, pneumonia, infection, trauma to the teeth and death.    Procedural time out performed. The oropharynx was anesthetized with topical 1% benzocaine.    Anesthesia was administered as moderate sedation.  The patient was administered a total of Versed 3 mg, Fentanyl 25 mcg to achieve and maintain moderate conscious sedation.  The patient's heart rate, blood pressure, and oxygen saturation are monitored continuously during the procedure. The period of conscious sedation is 26 minutes, of which I was present face-to-face 100% of this time.   The transesophageal probe was inserted in the esophagus and stomach without difficulty and multiple views were obtained.   COMPLICATIONS:    There were no immediate complications.  KEY FINDINGS:  Aortic atherosclerosis and small PFO.  Full report to follow. Further management per primary team.  Discussed with daughter and primary team.  Rudean Haskell, MD [place]  3:03 PM

## 2020-07-21 NOTE — Progress Notes (Signed)
  Echocardiogram Echocardiogram Transesophageal has been performed.  Jorge [name] M 07/21/2020, 2:52 PM

## 2020-07-22 DIAGNOSIS — G9340 Encephalopathy, unspecified: Secondary | ICD-10-CM | POA: Diagnosis not present

## 2020-07-22 DIAGNOSIS — Z992 Dependence on renal dialysis: Secondary | ICD-10-CM | POA: Diagnosis not present

## 2020-07-22 DIAGNOSIS — R410 Disorientation, unspecified: Secondary | ICD-10-CM | POA: Diagnosis not present

## 2020-07-22 DIAGNOSIS — N186 End stage renal disease: Secondary | ICD-10-CM | POA: Diagnosis not present

## 2020-07-22 LAB — BASIC METABOLIC PANEL
Anion gap: 15 (ref 5–15)
BUN: 67 mg/dL — ABNORMAL HIGH (ref 6–20)
CO2: 23 mmol/L (ref 22–32)
Calcium: 9.5 mg/dL (ref 8.9–10.3)
Chloride: 97 mmol/L — ABNORMAL LOW (ref 98–111)
Creatinine, Ser: 7.38 mg/dL — ABNORMAL HIGH (ref 0.61–1.24)
GFR, Estimated: 8 mL/min — ABNORMAL LOW (ref >60)
Glucose, Bld: 170 mg/dL — ABNORMAL HIGH (ref 70–99)
Potassium: 5.2 mmol/L — ABNORMAL HIGH (ref 3.5–5.1)
Sodium: 135 mmol/L (ref 135–145)

## 2020-07-22 LAB — GLUCOSE, CAPILLARY
Glucose-Capillary: 165 mg/dL — ABNORMAL HIGH (ref 70–99)
Glucose-Capillary: 97 mg/dL (ref 70–99)
Glucose-Capillary: 146 mg/dL — ABNORMAL HIGH (ref 70–99)
Glucose-Capillary: 82 mg/dL (ref 70–99)
Glucose-Capillary: 104 mg/dL — ABNORMAL HIGH (ref 70–99)
Glucose-Capillary: 207 mg/dL — ABNORMAL HIGH (ref 70–99)

## 2020-07-22 LAB — CBC
HCT: 34.3 % — ABNORMAL LOW (ref 39.0–52.0)
Hemoglobin: 11.2 g/dL — ABNORMAL LOW (ref 13.0–17.0)
MCH: 31.1 pg (ref 26.0–34.0)
MCHC: 32.7 g/dL (ref 30.0–36.0)
MCV: 95.3 fL (ref 80.0–100.0)
Platelets: 180 10*3/uL (ref 150–400)
RBC: 3.6 MIL/uL — ABNORMAL LOW (ref 4.22–5.81)
RDW: 16.7 % — ABNORMAL HIGH (ref 11.5–15.5)
WBC: 16.8 10*3/uL — ABNORMAL HIGH (ref 4.0–10.5)
nRBC: 0 % (ref 0.0–0.2)
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
## 2020-07-22 NOTE — TOC Initial Note (Signed)
Transition of Care [facility]) - Initial/Assessment Note    Patient Details  Name: Jorge [name] MRN: [id number] Date of Birth: 05-Dec-1970  Transition of Care [facility]) CM/SW Contact:    Verdell Carmine, RN Phone Number: 07/22/2020, 2:27 PM  Clinical Narrative:                 Patient admitted to ICU,toxic/metabolic encephalopathy and resp failure, hisotry of ESRD on dialysis. Patientwas  intubated, edtubated briefly and had to be re-intubated. Attempted to wean to day without respiratory effort, back on full support. , Recommend palliative to evaluate for GOC given ESRD and inability to wean, vs trach PEG and SNF placement.   Expected Discharge Plan: Skilled Nursing Facility Barriers to Discharge: Continued Medical Work up   Patient Goals and CMS Choice        Expected Discharge Plan and Services Expected Discharge Plan: [place] arrangements for the past 2 months: Single Family Home                                      Prior Living Arrangements/Services Living arrangements for the past 2 months: Single Family Home   Patient language and need for interpreter reviewed:: Yes        Need for Family Participation in Patient Care: Yes (Comment) Care giver support system in place?: Yes (comment)   Criminal Activity/Legal Involvement Pertinent to Current Situation/Hospitalization: No - Comment as needed  Activities of Daily Living      Permission Sought/Granted                  Emotional Assessment   Attitude/Demeanor/Rapport: Intubated (Following Commands or Not Following Commands) Affect (typically observed): Unable to Assess     Psych Involvement: No (comment)  Admission diagnosis:  Delirium [R41.0] Altered mental status [R41.82] Hypertensive urgency [I16.0] AMS (altered mental status) [R41.82] Patient Active Problem List   Diagnosis Date Noted   Malnutrition of moderate degree 07/14/2020   AMS (altered mental status)  07/13/2020   Hypertensive urgency 07/13/2020   Hyperlipidemia associated with type 2 diabetes mellitus ([place])    Stupor 05/10/2020   TIA (transient ischemic attack) 05/09/2020   Acute encephalopathy 10/03/2019   Personal history of anaphylaxis 08/28/2019   Coagulation defect, unspecified ([place]) 05/05/2019   Gastroparesis due to DM ([place]) 04/28/2019   Labile blood glucose    Anemia of chronic disease    S/P BKA (below knee amputation) bilateral ([place]) 03/31/2019   ESRD on peritoneal dialysis ([place])    ESRD (end stage renal disease) on dialysis ([place]) 03/19/2019   Hypertension associated with diabetes ([place])    Type 2 diabetes mellitus with chronic kidney disease on chronic dialysis, with long-term current use of insulin ([place])    Encounter for adequacy testing for peritoneal dialysis ([place]) 01/10/2019   Other disorders of bilirubin metabolism 01/10/2019   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   Other disorders resulting from impaired renal tubular function 07/05/2016   Secondary hyperparathyroidism of renal origin ([place]) 07/05/2016   PCP:  Lauree Chandler, NP Pharmacy:   [facility], [address] [place] [address] Phone: [phone] Fax: [phone]  [facility] [id number] - [place], [place] - [phone] AT [place] [address] [place] [address] Phone: [phone]  Fax: [phone]  [facility] [address] [address] Phone: [phone] Fax: [phone]     Social Determinants of Health (SDOH) Interventions    Readmission Risk Interventions No flowsheet data found.

## 2020-07-22 NOTE — Progress Notes (Signed)
[facility] Progress Note   Subjective:    Seen in ICU , on vent, eyes open , follow simple commands    Objective Vitals:   07/22/20 1315 07/22/20 1330 07/22/20 1345 07/22/20 1400  BP: (!) 106/49 (!) 90/51 102/63 109/75  Pulse: 94 (!) 101 88 97  Resp: (!) 29 (!) 27 (!) 27 (!) 27  Temp:      TempSrc:      SpO2: 100% 96% 96% 95%  Weight:      Height:       Physical Exam  on vent , wide awake and responsive  no jvd  throat ett in place  Chest cta bilat and lat  Cor reg no RG  Abd soft ntnd no ascites   Ext bilat BKA, no edema   Neuro follows commands today Access RUE AVF bruit and thrill   Additional Objective Labs: Basic Metabolic Panel: Recent Labs  Lab 07/15/20 1603 07/16/20 0317 07/18/20 0346 07/18/20 1212 07/20/20 0341 07/21/20 0437 07/22/20 0400  NA  --    < > 138   < > 140 140 135  K  --    < > 4.1   < > 4.0 3.7 5.2*  CL  --    < > 96*   < > 98 101 97*  CO2  --    < > 22   < > _0 GLUCOSE  --    < > 310*   < > 166* 100* 170*  BUN  --    < > 65*   < > 55* 41* 67*  CREATININE  --    < > 8.06*   < > 7.06* 5.33* 7.38*  CALCIUM  --    < > 10.9*   < > 10.4* 9.7 9.5  PHOS 7.2*  --  7.1*  --   --   --   --    < > = values in this interval not displayed.    No results for input(s): AST, ALT, ALKPHOS, BILITOT, PROT, ALBUMIN in the last 168 hours.  CBC: Recent Labs  Lab 07/17/20 0336 07/18/20 0346 07/18/20 1212 07/19/20 0329 07/21/20 0437 07/22/20 0400  WBC 7.7 7.4  --  10.4 13.7* 16.8*  HGB 11.8* 12.4*   < > 11.7* 11.6* 11.2*  HCT 37.5* 38.1*   < > 36.6* 36.3* 34.3*  MCV 94.9 92.7  --  94.1 96.3 95.3  PLT 97* 127*  --  158 182 180   < > = values in this interval not displayed.     Medications:  sodium chloride     sodium chloride 10 mL/hr at 07/22/20 0600   feeding supplement (NEPRO CARB STEADY) 1,000 mL (07/22/20 0400)   norepinephrine (LEVOPHED) Adult infusion 1 mcg/min (07/22/20 0600)   sodium chloride      aspirin   81 mg Per Tube Daily   atorvastatin  40 mg Per Tube Daily   carvedilol  6.25 mg Per Tube BID WC   chlorhexidine gluconate (MEDLINE KIT)  15 mL Mouth Rinse BID   Chlorhexidine Gluconate Cloth  6 each Topical Q1200   docusate  100 mg Per Tube BID   feeding supplement (PROSource TF)  45 mL Per Tube TID   heparin injection (subcutaneous)  5,000 Units Subcutaneous Q8H   insulin aspart  0-15 Units Subcutaneous Q4H   insulin aspart  4 Units Subcutaneous Q4H   insulin glargine  10 Units Subcutaneous Daily   lanthanum  500 mg Per Tube TID WC   mouth rinse  15 mL Mouth Rinse 10 times per day   pantoprazole sodium  40 mg Per Tube Daily   sodium chloride flush  10-40 mL Intracatheter Q12H    Dialysis: MWF [facility]  4h   68kg  2/2.5 bath  AVF  P2  Hep none - Hecoral 61mg Iv q HD  Assessment/Plan:  # AMS - improving, multiple bilat CVA's, possibly some uremia. Is awake today on the vent.   # Acute hypoxic resp failure. Intubated 7/7, then self-extubated on 7/9 and reintubated 7/11. Per CCM.   # Staph hominis bacteremia/ RLL aspiration pna - Blood cx's 1/2 + for staph hominis on 7/06.  Had LP on 7/7 with possible meningitis/encephalitis. SP course of IV abx.  # Hypertensive Urgency: resolved, BP's normal to low normal today  # Volume - 3 kg under dry wt and looks dry, no fluid off w/ HD today, let volume come up the next few days.   # ESRD:  HD per MWF schedule. HD today.   # Anemia of CKD no ESA needs. monitor trends  # Secondary hyperparathyroidism: hold VDRA for calcium for now.  Hyperphos - resumed home renvela. Switched to nepro for feeds  # T2DM - per primary team  # Hx ICH - head CT without acute abnormality multifactorial.     RSol Blazing MD 07/22/2020,2:29 PM [facility]

## 2020-07-22 NOTE — Progress Notes (Signed)
RT attempted to wean ventilator during morning rounds.  Patient had no effort to breath on their own.  Patient was placed back on full support and will continue to be monitored by RT throughout the shift.

## 2020-07-22 NOTE — Progress Notes (Signed)
[facility] Progress Note Patient Name: Jorge [name] DOB: [birth year] MRN: [id number]   Date of Service  07/22/2020  HPI/Events of Note  Patient is on sliding scale Humalog coverage as well as Q 4 hourly fixed dose Humalog coverage, his blood sugar's are low normal range, he has ESRD.  eICU Interventions  Fixed dose Q 4 hourly Humalog insulin coverage discontinued.        Frederik Pear 07/22/2020, 8:42 PM

## 2020-07-22 NOTE — Progress Notes (Signed)
Neurology Progress Note  Patient ID: Jorge [name] is a 50 y.o.  with a past medical history significant for hypertension, hyperlipidemia, diabetes, end-stage renal disease on peritoneal dialysis, right basal ganglia ICH with IVH and subarachnoid hemorrhage (hypertensive bleed, 10/03/2019), bilateral BKA as detailed above who has had a recent complicated hospital course, with initial improvement in mental status followed by substantial decline 7/11 AM  Hospital course: He presented to the ED on 07/13/2020 with confusion during dialysis (incomplete session secondary to him pulling at lines), found to be volume overloaded, with labile blood pressures (initially normotensive, but hypertensive into the 200s with worsening altered mental status).  Due to continued agitation he was intubated to facilitate work-up for his altered mental status including MRI, LP and EEG   -1 out of 2 blood cultures on admission were positive for Staph hominis, possibly contaminant -He was briefly febrile on admission, and was treated with broad-spectrum antibiotics (eventually meningitis coverage with acyclovir, ceftriaxone, vancomycin), then narrowed to ceftriaxone. -Work-up included lumbar puncture completed 7/7 WBC 16/19, RBC 1760/595 with a neutrophilic predominance, glucose 117 (serum glucose 171), protein 41             HSV 1/2 PCR were negative, cryptococcal antigen was negative, bacterial culture was negative -HIV is negative, additionally 7/13 Hep B, Hep C and RPR negative -CT chest 07/14/2020 was notable for right lower lobe opacity concerning for pneumonia, possibly secondary to aspiration and enlarged right paratracheal and precarinal adenopathy, with differential including infection, inflammation/malignancy -Routine EEG 07/15/2020 showing continuous slow activity, generalized -MRI brain demonstrated multiple small CVAs (7 mm in the left cerebellum, punctate right inferior posterior parietal white matter, 2 linear acute  infarctions in the left frontal white matter) felt to be related to concurrent small vessel disease and blood pressure swings during hemodialysis, for which neurology was not formally consulted -TTE was negative for vegetations and showed normal EF -Although he continued to be encephalopathic he self extubated on 7/9 and by 7/10 was doing well on room air, following commands intermittently -Neurology was involved for code stroke on 7/11 after he became encephalopathic with left gaze deviation -Has been requiring some Levophed since, at low doses, and has been continued on piperacillin tazobactam which was started on 7/11 - EEG with encephalopathy, no epileptogenic activity 7/11 - 7/12  - MRI with at least one-three additional punctate strokes 7/12 - Improving mental status 7/14 (following commands)  - TEE completed 7/14, negative for mass/vegetation, positive for small PFO  Subjective/interval events: - Tmax 99.7 F overnight with worsening leukocytosis this morning - TEE completed as below - Patient communicates he is hungry and wants to be extubated  Exam: Vitals:   07/22/20 0630 07/22/20 0723  BP: (!) 135/51   Pulse: 89   Resp: 20   Temp:  99.7 F (37.6 C)  SpO2: 100%    Gen: In bed, comfortable at rest, Resp: Intubated, comfortable on the vent today, but wants it out Cardiac: Perfusing extremities well  Abd: soft, nt Musculoskeletal: Stable bilateral BKA's  Neuro:  MS: Eyes open, briskly follows commands w/ RUE (shows two fingers), communicating well within limits of ETT answers yes/no questions appropriately, denies any pain.  CN: Tracks examiner in all directions, and external ocular movements appear intact.  Orients to stimuli in all visual fields Upper face is symmetric.  Motor: At least 3/5 in the bilateral lower extremities, more briskly moving the right than the left. 5/5 hip flexion bilateral LE.    Sensory: Equally  reactive to touch in all 4 extremities  Pertinent  Labs:  Basic Metabolic Panel: Recent Labs  Lab 07/15/20 1603 07/16/20 0317 07/18/20 0346 07/18/20 1212 07/19/20 0329 07/20/20 0341 07/21/20 0437 07/22/20 0400  NA  --    < > 138 138 139 140 140 135  K  --    < > 4.1 3.9 4.0 4.0 3.7 5.2*  CL  --    < > 96*  --  98 98 101 97*  CO2  --    < > 22  --  '23 25 27 23  '$ GLUCOSE  --    < > 310*  --  228* 166* 100* 170*  BUN  --    < > 65*  --  84* 55* 41* 67*  CREATININE  --    < > 8.06*  --  9.57* 7.06* 5.33* 7.38*  CALCIUM  --    < > 10.9*  --  10.9* 10.4* 9.7 9.5  MG 2.3  --   --   --   --  2.8*  --   --   PHOS 7.2*  --  7.1*  --   --   --   --   --    < > = values in this interval not displayed.     CBC: Recent Labs  Lab 07/17/20 0336 07/18/20 0346 07/18/20 1212 07/19/20 0329 07/21/20 0437 07/22/20 0400  WBC 7.7 7.4  --  10.4 13.7* 16.8*  HGB 11.8* 12.4* 12.9* 11.7* 11.6* 11.2*  HCT 37.5* 38.1* 38.0* 36.6* 36.3* 34.3*  MCV 94.9 92.7  --  94.1 96.3 95.3  PLT 97* 127*  --  158 182 180    TEE 7/14  1. Evidence of atrial level shunting detected by color flow Doppler.  Agitated saline contrast bubble study was positive with shunting observed  within 3-6 cardiac cycles suggestive of interatrial shunt. There is a very  small patent foramen ovale with  predominantly left to right shunting across the atrial septum best seen in  3D glass resconstruction.   2. Normal PV flow; SVC position, normal CS position.   3. Left ventricular ejection fraction, by estimation, is 60 to 65%. The  left ventricle has normal function. The left ventricle has no regional  wall motion abnormalities. There is severe concentric left ventricular  hypertrophy. LV appears to be  underfilled; there is no evidence of systolic anterior motion of the  mitral valve.   4. There is Moderate (Grade III) plaque involving the descending aorta.   5. Right ventricular systolic function is normal. The right ventricular  size is normal.   6. No left atrial/left  atrial appendage thrombus was detected.   7. The mitral valve is normal in structure. No evidence of mitral valve  regurgitation.   8. The aortic valve is tricuspid. Aortic valve regurgitation is not  visualized. No aortic stenosis is present.   Impression: Examination continues to be markedly improved today, suggestive of prior toxic/metabolic encephalopathy which is continuing to clear, perhaps even an aseptic meningitis. Despite intracranial athero, tolerating MAPs of 66 with excellent exam.   Of note, given his severe intracranial atherosclerosis and his requirement for dialysis with hypoperfusion during dialysis, it may be helpful to start midodrine on dialysis days, but defer to nephrology/primary team  Recommendations: -Okay to liberalize MAP goal to > 65 from prior recommmendation of > 70 -Consider midodrine prior to dialysis to support BP, defer to nephrology/primary team -Serum studies: T-spot in process,  appreciate follow-up by primary team -CSF studies added on  -- VZV IgG and CMV in process; appreciate follow-up by primary team, given marked improvement in mental status at this point low concern for ongoing CNS meningitis -Repeat blood cultures from 7/13 negative to date  -Further workup of mediastinal lymphadenopathy per primary team -Appreciate CCM team management of ventilator and other comorbidities. -Given marked improvement, neurology will be available on an as-needed basis going forward but please reconsult if new questions arise  Lesleigh Noe MD-PhD [facility] [phone]   CRITICAL CARE Performed by: Lorenza Chick   Total critical care time: 35 minutes  Critical care time was exclusive of separately billable procedures and treating other patients.  Critical care was necessary to treat or prevent imminent or life-threatening deterioration.  Critical care was time spent personally by me on the following activities: development of treatment plan  with patient and/or surrogate as well as nursing, discussions with consultants, evaluation of patient's response to treatment, examination of patient, obtaining history from patient or surrogate, ordering and performing treatments and interventions, ordering and review of laboratory studies, ordering and review of radiographic studies, pulse oximetry and re-evaluation of patient's condition.

## 2020-07-23 ENCOUNTER — Inpatient Hospital Stay (HOSPITAL_COMMUNITY): Payer: Medicare Other

## 2020-07-23 DIAGNOSIS — G9340 Encephalopathy, unspecified: Secondary | ICD-10-CM | POA: Diagnosis not present

## 2020-07-23 DIAGNOSIS — J9601 Acute respiratory failure with hypoxia: Secondary | ICD-10-CM | POA: Diagnosis not present

## 2020-07-23 DIAGNOSIS — N186 End stage renal disease: Secondary | ICD-10-CM | POA: Diagnosis not present

## 2020-07-23 DIAGNOSIS — Z992 Dependence on renal dialysis: Secondary | ICD-10-CM | POA: Diagnosis not present

## 2020-07-23 LAB — MAGNESIUM: Magnesium: 2.2 mg/dL (ref 1.7–2.4)

## 2020-07-23 LAB — GLUCOSE, CAPILLARY
Glucose-Capillary: 146 mg/dL — ABNORMAL HIGH (ref 70–99)
Glucose-Capillary: 151 mg/dL — ABNORMAL HIGH (ref 70–99)
Glucose-Capillary: 218 mg/dL — ABNORMAL HIGH (ref 70–99)
Glucose-Capillary: 153 mg/dL — ABNORMAL HIGH (ref 70–99)
Glucose-Capillary: 108 mg/dL — ABNORMAL HIGH (ref 70–99)
Glucose-Capillary: 96 mg/dL (ref 70–99)

## 2020-07-23 LAB — CBC WITH DIFFERENTIAL/PLATELET
Abs Immature Granulocytes: 0.21 10*3/uL — ABNORMAL HIGH (ref 0.00–0.07)
Basophils Absolute: 0.1 10*3/uL (ref 0.0–0.1)
Basophils Relative: 0 %
Eosinophils Absolute: 1.2 10*3/uL — ABNORMAL HIGH (ref 0.0–0.5)
Eosinophils Relative: 6 %
HCT: 35.8 % — ABNORMAL LOW (ref 39.0–52.0)
Hemoglobin: 11.4 g/dL — ABNORMAL LOW (ref 13.0–17.0)
Immature Granulocytes: 1 %
Lymphocytes Relative: 13 %
Lymphs Abs: 2.5 10*3/uL (ref 0.7–4.0)
MCH: 30.6 pg (ref 26.0–34.0)
MCHC: 31.8 g/dL (ref 30.0–36.0)
MCV: 96 fL (ref 80.0–100.0)
Monocytes Absolute: 2 10*3/uL — ABNORMAL HIGH (ref 0.1–1.0)
Monocytes Relative: 11 %
Neutro Abs: 13.2 10*3/uL — ABNORMAL HIGH (ref 1.7–7.7)
Neutrophils Relative %: 69 %
Platelets: 179 10*3/uL (ref 150–400)
RBC: 3.73 MIL/uL — ABNORMAL LOW (ref 4.22–5.81)
RDW: 17.1 % — ABNORMAL HIGH (ref 11.5–15.5)
WBC: 19.2 10*3/uL — ABNORMAL HIGH (ref 4.0–10.5)
nRBC: 0 % (ref 0.0–0.2)

## 2020-07-23 LAB — BASIC METABOLIC PANEL
Anion gap: 11 (ref 5–15)
BUN: 43 mg/dL — ABNORMAL HIGH (ref 6–20)
CO2: 27 mmol/L (ref 22–32)
Calcium: 9.7 mg/dL (ref 8.9–10.3)
Chloride: 98 mmol/L (ref 98–111)
Creatinine, Ser: 5.54 mg/dL — ABNORMAL HIGH (ref 0.61–1.24)
GFR, Estimated: 12 mL/min — ABNORMAL LOW (ref >60)
Glucose, Bld: 148 mg/dL — ABNORMAL HIGH (ref 70–99)
Potassium: 3.8 mmol/L (ref 3.5–5.1)
Sodium: 136 mmol/L (ref 135–145)

## 2020-07-23 LAB — CULTURE, BLOOD (ROUTINE X 2)

## 2020-07-23 IMAGING — DX DG CHEST 1V PORT
1 series · 1 of 1 positions shown · non-contrast
Comparison: [DATE]

CLINICAL DATA: Respirator dependent.

EXAM:
PORTABLE CHEST 1 VIEW

[chest ap]
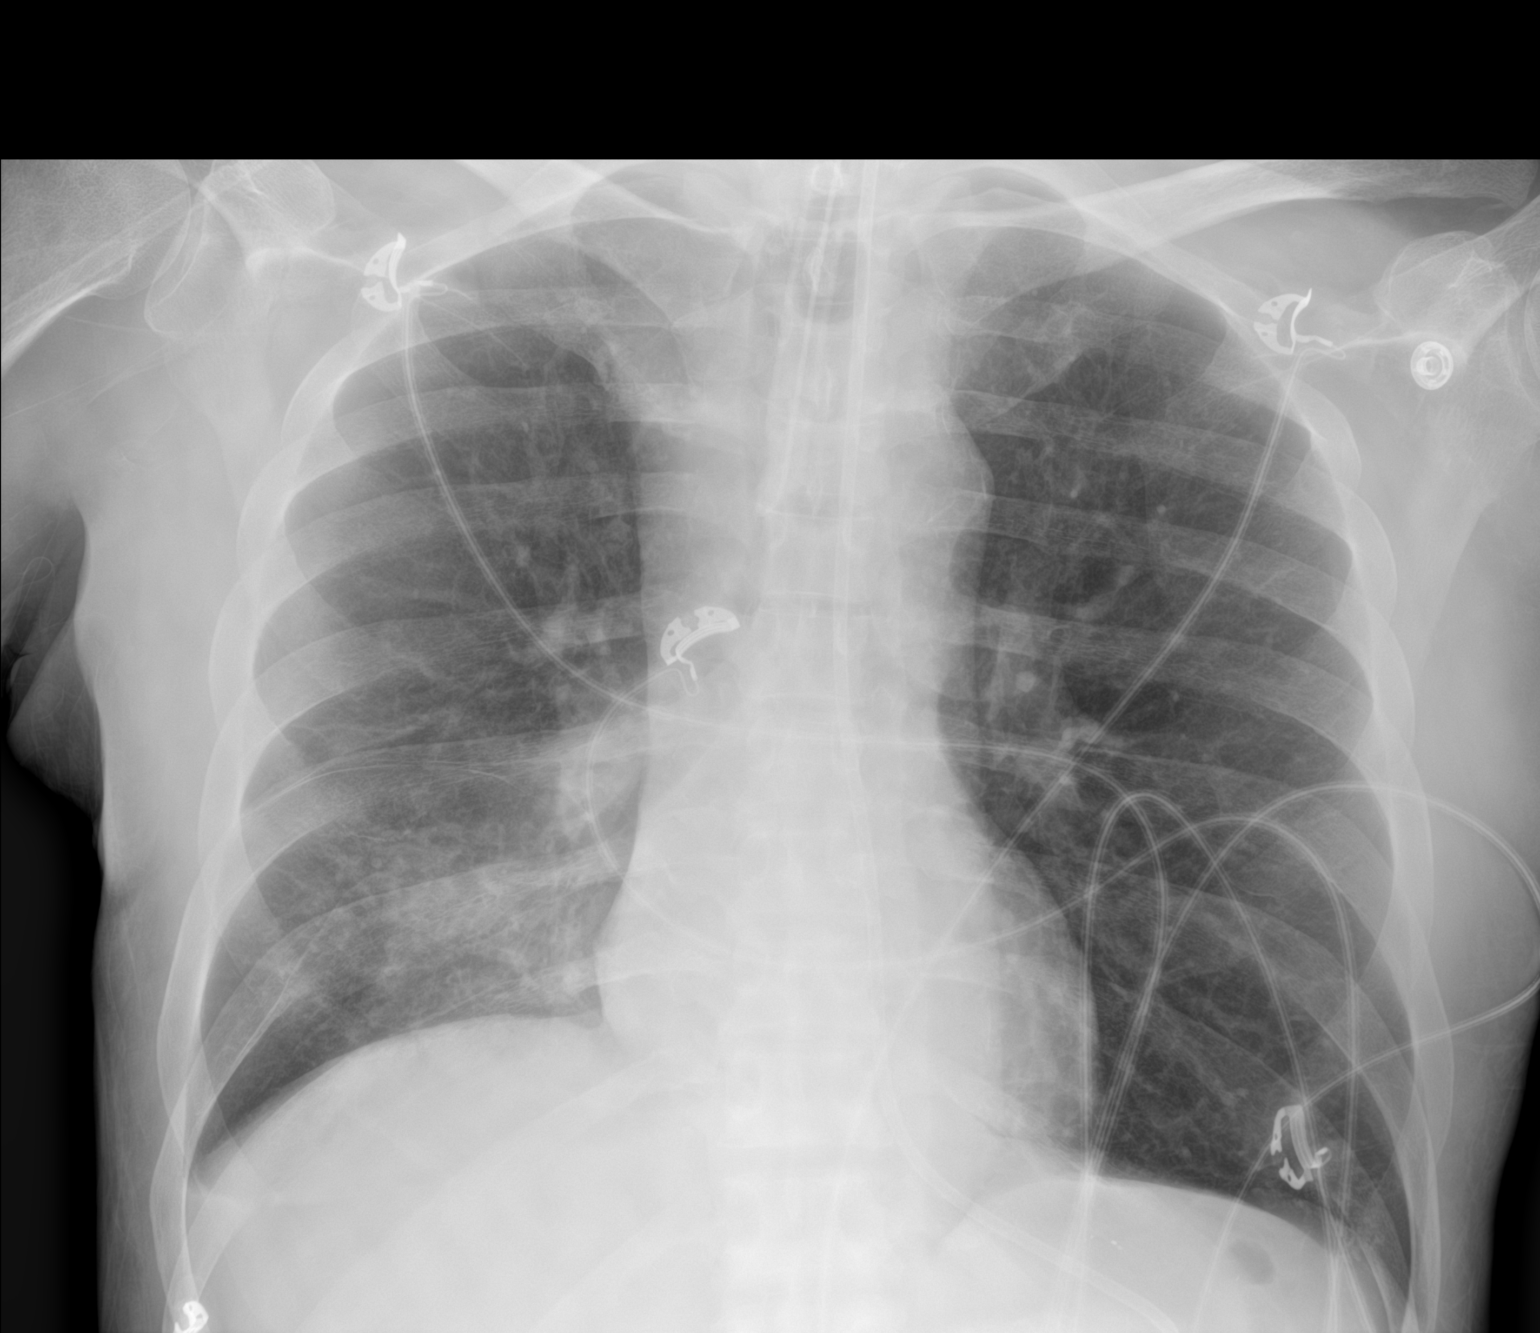

[1 of 1 positions shown; findings below may reference images not displayed]

FINDINGS: Endotracheal tube and enteric catheter in satisfactory position,
where visualized.

Cardiomediastinal silhouette is normal. Mediastinal contours appear
intact.

Right lower lung field airspace consolidation, more prominent
radiographically than on [DATE]. No evidence of pleural
effusion or pneumothorax.

Osseous structures are without acute abnormality. Soft tissues are
grossly normal.
IMPRESSION: Right lower lung field airspace consolidation, more prominent
radiographically than on [DATE].

## 2020-07-23 MED ORDER — VANCOMYCIN HCL 1500 MG/300ML IV SOLN
1500.0000 mg | Freq: Once | INTRAVENOUS | Status: AC
  Administered 2020-07-23: 1500 mg via INTRAVENOUS
  Filled 2020-07-23: qty 300

## 2020-07-23 MED ORDER — INSULIN ASPART 100 UNIT/ML IJ SOLN
2.0000 [IU] | INTRAMUSCULAR | Status: DC
  Administered 2020-07-23 – 2020-07-25 (×11): 2 [IU] via SUBCUTANEOUS

## 2020-07-23 MED ORDER — MIDODRINE HCL 5 MG PO TABS
10.0000 mg | ORAL_TABLET | Freq: Three times a day (TID) | ORAL | Status: DC
  Administered 2020-07-23 (×2): 10 mg via ORAL
  Filled 2020-07-23 (×3): qty 2

## 2020-07-23 MED ORDER — ACETAMINOPHEN 160 MG/5ML PO SOLN
650.0000 mg | Freq: Once | ORAL | Status: AC
  Administered 2020-07-23: 650 mg
  Filled 2020-07-23: qty 20.3

## 2020-07-23 MED ORDER — MIDODRINE HCL 5 MG PO TABS
10.0000 mg | ORAL_TABLET | Freq: Three times a day (TID) | ORAL | Status: DC
  Administered 2020-07-24 – 2020-07-25 (×4): 10 mg
  Filled 2020-07-23 (×4): qty 2

## 2020-07-23 MED ORDER — PIPERACILLIN-TAZOBACTAM IN DEX 2-0.25 GM/50ML IV SOLN
2.2500 g | Freq: Three times a day (TID) | INTRAVENOUS | Status: DC
  Administered 2020-07-23 – 2020-07-25 (×7): 2.25 g via INTRAVENOUS
  Filled 2020-07-23 (×9): qty 50

## 2020-07-23 MED ORDER — VANCOMYCIN HCL IN DEXTROSE 750-5 MG/150ML-% IV SOLN
750.0000 mg | INTRAVENOUS | Status: AC
  Filled 2020-07-23 (×3): qty 150

## 2020-07-23 MED ORDER — SODIUM CHLORIDE 0.9 % IV BOLUS
1500.0000 mL | Freq: Once | INTRAVENOUS | Status: AC
  Administered 2020-07-23: 1500 mL via INTRAVENOUS

## 2020-07-23 MED ORDER — MIDODRINE HCL 5 MG PO TABS: 10.0000 mg | ORAL_TABLET | Freq: Three times a day (TID) | ORAL | Status: DC

## 2020-07-23 NOTE — Progress Notes (Signed)
[facility] Progress Note Patient Name: Jorge [name] DOB: 11-13-1970 MRN: [id number]   Date of Service  07/23/2020  HPI/Events of Note  Patient needs bilateral soft wrist restraints order renewed due to continued high risk for self extubation.  eICU Interventions  Restraints order renewed.        Kerry Kass Jahdai Padovano 07/23/2020, 8:02 PM
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
## 2020-07-23 NOTE — Progress Notes (Signed)
Pharmacy Antibiotic Note  Jorge [name] is a 50 y.o. male with VDRF on pressors with concern of PNA and recent cultures with staphylococcus auricularis bacteremia 1/4  -WBC= 19.2, tmax= 102.6 -Noted with ESRD on HD MWF -Recently on antibiotics for PNA  Plan: -Vancomycin '1500mg'$  IV xq then '750mg'$  IV with HD MWF -Zosyn 2.25gm IV q8h -Will follow  cultures and clinical progress   Height: '5\' 11"'$  (180.3 cm) Weight: 64.1 kg (141 lb 5 oz) IBW/kg (Calculated) : 75.3  Temp (24hrs), Avg:100.2 F (37.9 C), Min:98.5 F (36.9 C), Max:102.6 F (39.2 C)  Recent Labs  Lab 07/18/20 0346 07/19/20 0329 07/20/20 0341 07/21/20 0437 07/22/20 0400 07/23/20 0520  WBC 7.4 10.4  --  13.7* 16.8* 19.2*  CREATININE 8.06* 9.57* 7.06* 5.33* 7.38* 5.54*    Estimated Creatinine Clearance: 14.1 mL/min (A) (by C-G formula based on SCr of 5.54 mg/dL (H)).    Allergies  Allergen Reactions   Lactose Intolerance (Gi) Diarrhea and Nausea Only      Thank you for allowing pharmacy to be a part of this patient's care.  Hildred Laser, PharmD Clinical Pharmacist **Pharmacist phone directory can now be found on [email or web address] ([email or web address]).  Listed under [place].

## 2020-07-23 NOTE — Progress Notes (Signed)
PIV removed during bed change at shift change. Multiple RN attempts, IV team called for immediate placement of access.   MD, Byrum notified at 0800 of hypotension - MD to room, no new orders at this time.

## 2020-07-23 NOTE — Progress Notes (Signed)
[facility] Progress Note   Subjective:    Seen in ICU , on vent, eyes open , follow simple commands    Objective Vitals:   07/23/20 0930 07/23/20 0945 07/23/20 1138 07/23/20 1151  BP:  (!) 128/36 106/62   Pulse: 90 87 83   Resp: _0 Temp:    99.3 F (37.4 C)  TempSrc:    Oral  SpO2: 99% 100% 100%   Weight:      Height:       Physical Exam  on vent , wide awake  no jvd  throat ett in place  Chest cta bilat and lat  Cor reg no RG  Abd soft ntnd no ascites   Ext bilat BKA, no edema   Neuro - alert again today Access RUE AVF bruit and thrill   Additional Objective Labs: Basic Metabolic Panel: Recent Labs  Lab 07/18/20 0346 07/18/20 1212 07/21/20 0437 07/22/20 0400 07/23/20 0520  NA 138   < > 140 135 136  K 4.1   < > 3.7 5.2* 3.8  CL 96*   < > 101 97* 98  CO2 22   < > _1 GLUCOSE 310*   < > 100* 170* 148*  BUN 65*   < > 41* 67* 43*  CREATININE 8.06*   < > 5.33* 7.38* 5.54*  CALCIUM 10.9*   < > 9.7 9.5 9.7  PHOS 7.1*  --   --   --   --    < > = values in this interval not displayed.    No results for input(s): AST, ALT, ALKPHOS, BILITOT, PROT, ALBUMIN in the last 168 hours.  CBC: Recent Labs  Lab 07/18/20 0346 07/18/20 1212 07/19/20 0329 07/21/20 0437 07/22/20 0400 07/23/20 0520  WBC 7.4  --  10.4 13.7* 16.8* 19.2*  NEUTROABS  --   --   --   --   --  13.2*  HGB 12.4*   < > 11.7* 11.6* 11.2* 11.4*  HCT 38.1*   < > 36.6* 36.3* 34.3* 35.8*  MCV 92.7  --  94.1 96.3 95.3 96.0  PLT 127*  --  158 182 180 179   < > = values in this interval not displayed.     Medications:  sodium chloride     sodium chloride Stopped (07/22/20 1649)   feeding supplement (NEPRO CARB STEADY) 1,000 mL (07/23/20 0353)   norepinephrine (LEVOPHED) Adult infusion 4 mcg/min (07/23/20 1100)   sodium chloride      aspirin  81 mg Per Tube Daily   atorvastatin  40 mg Per Tube Daily   carvedilol  6.25 mg Per Tube BID WC   chlorhexidine gluconate  (MEDLINE KIT)  15 mL Mouth Rinse BID   Chlorhexidine Gluconate Cloth  6 each Topical Q1200   docusate  100 mg Per Tube BID   feeding supplement (PROSource TF)  45 mL Per Tube TID   heparin injection (subcutaneous)  5,000 Units Subcutaneous Q8H   insulin aspart  0-15 Units Subcutaneous Q4H   insulin aspart  2 Units Subcutaneous Q4H   insulin glargine  10 Units Subcutaneous Daily   lanthanum  500 mg Per Tube TID WC   mouth rinse  15 mL Mouth Rinse 10 times per day   midodrine  10 mg Oral TID   pantoprazole sodium  40 mg Per Tube Daily   sodium chloride flush  10-40 mL Intracatheter Q12H    Dialysis: MWF  [place]  4h   68kg  2/2.5 bath  AVF  P2  Hep none - Hecoral 4mg Iv q HD  Assessment/Plan:  # AMS - improving, multiple bilat CVA's, possibly some uremia. Is awake today on the vent.   # Acute hypoxic resp failure. Intubated 7/7, then self-extubated on 7/9 and reintubated 7/11. Per CCM.   # Staph hominis bacteremia/ RLL aspiration pna - Blood cx's 1/2 + for staph hominis on 7/06.  Had LP on 7/7 with possible meningitis/encephalitis. SP course of IV abx.  # Volume/ Hypotension: BP's normal to low today, he looks dry on exam and is 3kg under dry wt.  Will bolus 1.5 L over 6 hrs.   # ESRD:  HD per MWF schedule.   # Anemia of CKD no ESA needs. monitor trends. Hb 11  # Secondary hyperparathyroidism: hold VDRA for calcium for now.  Hyperphos - resumed home renvela. Switched to nepro for feeds  # T2DM - per primary team  # Hx ICH - head CT without acute abnormality multifactorial.     RSol Blazing MD 07/23/2020,1:52 PM [facility]

## 2020-07-23 NOTE — Progress Notes (Signed)
[place] Progress Note Patient Name: ZAKHAI JETTE DOB: 30-Nov-1970 MRN: [id number]   Date of Service  07/23/2020  HPI/Events of Note  Patient needs a.m. labs ordered.  eICU Interventions  Labs ordered.        Kerry Kass Chardonnay Holzmann 07/23/2020, 2:20 AM

## 2020-07-24 ENCOUNTER — Inpatient Hospital Stay (HOSPITAL_COMMUNITY): Payer: Medicare Other

## 2020-07-24 DIAGNOSIS — J9601 Acute respiratory failure with hypoxia: Secondary | ICD-10-CM | POA: Diagnosis not present

## 2020-07-24 DIAGNOSIS — G9340 Encephalopathy, unspecified: Secondary | ICD-10-CM | POA: Diagnosis not present

## 2020-07-24 DIAGNOSIS — N186 End stage renal disease: Secondary | ICD-10-CM | POA: Diagnosis not present

## 2020-07-24 DIAGNOSIS — Z992 Dependence on renal dialysis: Secondary | ICD-10-CM | POA: Diagnosis not present

## 2020-07-24 LAB — CBC
HCT: 34.9 % — ABNORMAL LOW (ref 39.0–52.0)
Hemoglobin: 10.9 g/dL — ABNORMAL LOW (ref 13.0–17.0)
MCH: 30.6 pg (ref 26.0–34.0)
MCHC: 31.2 g/dL (ref 30.0–36.0)
MCV: 98 fL (ref 80.0–100.0)
Platelets: 161 10*3/uL (ref 150–400)
RBC: 3.56 MIL/uL — ABNORMAL LOW (ref 4.22–5.81)
RDW: 16.8 % — ABNORMAL HIGH (ref 11.5–15.5)
WBC: 18.6 10*3/uL — ABNORMAL HIGH (ref 4.0–10.5)
nRBC: 0 % (ref 0.0–0.2)

## 2020-07-24 LAB — COMPREHENSIVE METABOLIC PANEL
ALT: 106 U/L — ABNORMAL HIGH (ref 0–44)
AST: 103 U/L — ABNORMAL HIGH (ref 15–41)
Albumin: 2.6 g/dL — ABNORMAL LOW (ref 3.5–5.0)
Alkaline Phosphatase: 144 U/L — ABNORMAL HIGH (ref 38–126)
Anion gap: 14 (ref 5–15)
BUN: 61 mg/dL — ABNORMAL HIGH (ref 6–20)
CO2: 22 mmol/L (ref 22–32)
Calcium: 9.3 mg/dL (ref 8.9–10.3)
Chloride: 96 mmol/L — ABNORMAL LOW (ref 98–111)
Creatinine, Ser: 6.88 mg/dL — ABNORMAL HIGH (ref 0.61–1.24)
GFR, Estimated: 9 mL/min — ABNORMAL LOW (ref >60)
Glucose, Bld: 147 mg/dL — ABNORMAL HIGH (ref 70–99)
Potassium: 3.5 mmol/L (ref 3.5–5.1)
Sodium: 132 mmol/L — ABNORMAL LOW (ref 135–145)
Total Bilirubin: 0.4 mg/dL (ref 0.3–1.2)
Total Protein: 7.5 g/dL (ref 6.5–8.1)

## 2020-07-24 LAB — LACTIC ACID, PLASMA: Lactic Acid, Venous: 1.6 mmol/L (ref 0.5–1.9)

## 2020-07-24 LAB — MAGNESIUM: Magnesium: 2.3 mg/dL (ref 1.7–2.4)

## 2020-07-24 LAB — GLUCOSE, CAPILLARY
Glucose-Capillary: 108 mg/dL — ABNORMAL HIGH (ref 70–99)
Glucose-Capillary: 111 mg/dL — ABNORMAL HIGH (ref 70–99)
Glucose-Capillary: 117 mg/dL — ABNORMAL HIGH (ref 70–99)
Glucose-Capillary: 120 mg/dL — ABNORMAL HIGH (ref 70–99)
Glucose-Capillary: 148 mg/dL — ABNORMAL HIGH (ref 70–99)
Glucose-Capillary: 154 mg/dL — ABNORMAL HIGH (ref 70–99)

## 2020-07-24 LAB — CULTURE, BLOOD (ROUTINE X 2)
Culture: NO GROWTH
Special Requests: ADEQUATE

## 2020-07-24 LAB — PHOSPHORUS: Phosphorus: 5.1 mg/dL — ABNORMAL HIGH (ref 2.5–4.6)

## 2020-07-24 IMAGING — DX DG CHEST 1V PORT
1 series · 1 of 1 positions shown · non-contrast
Comparison: [DATE]

CLINICAL DATA: Reason for exam pneumonia

EXAM:
PORTABLE CHEST 1 VIEW

[chest ap]
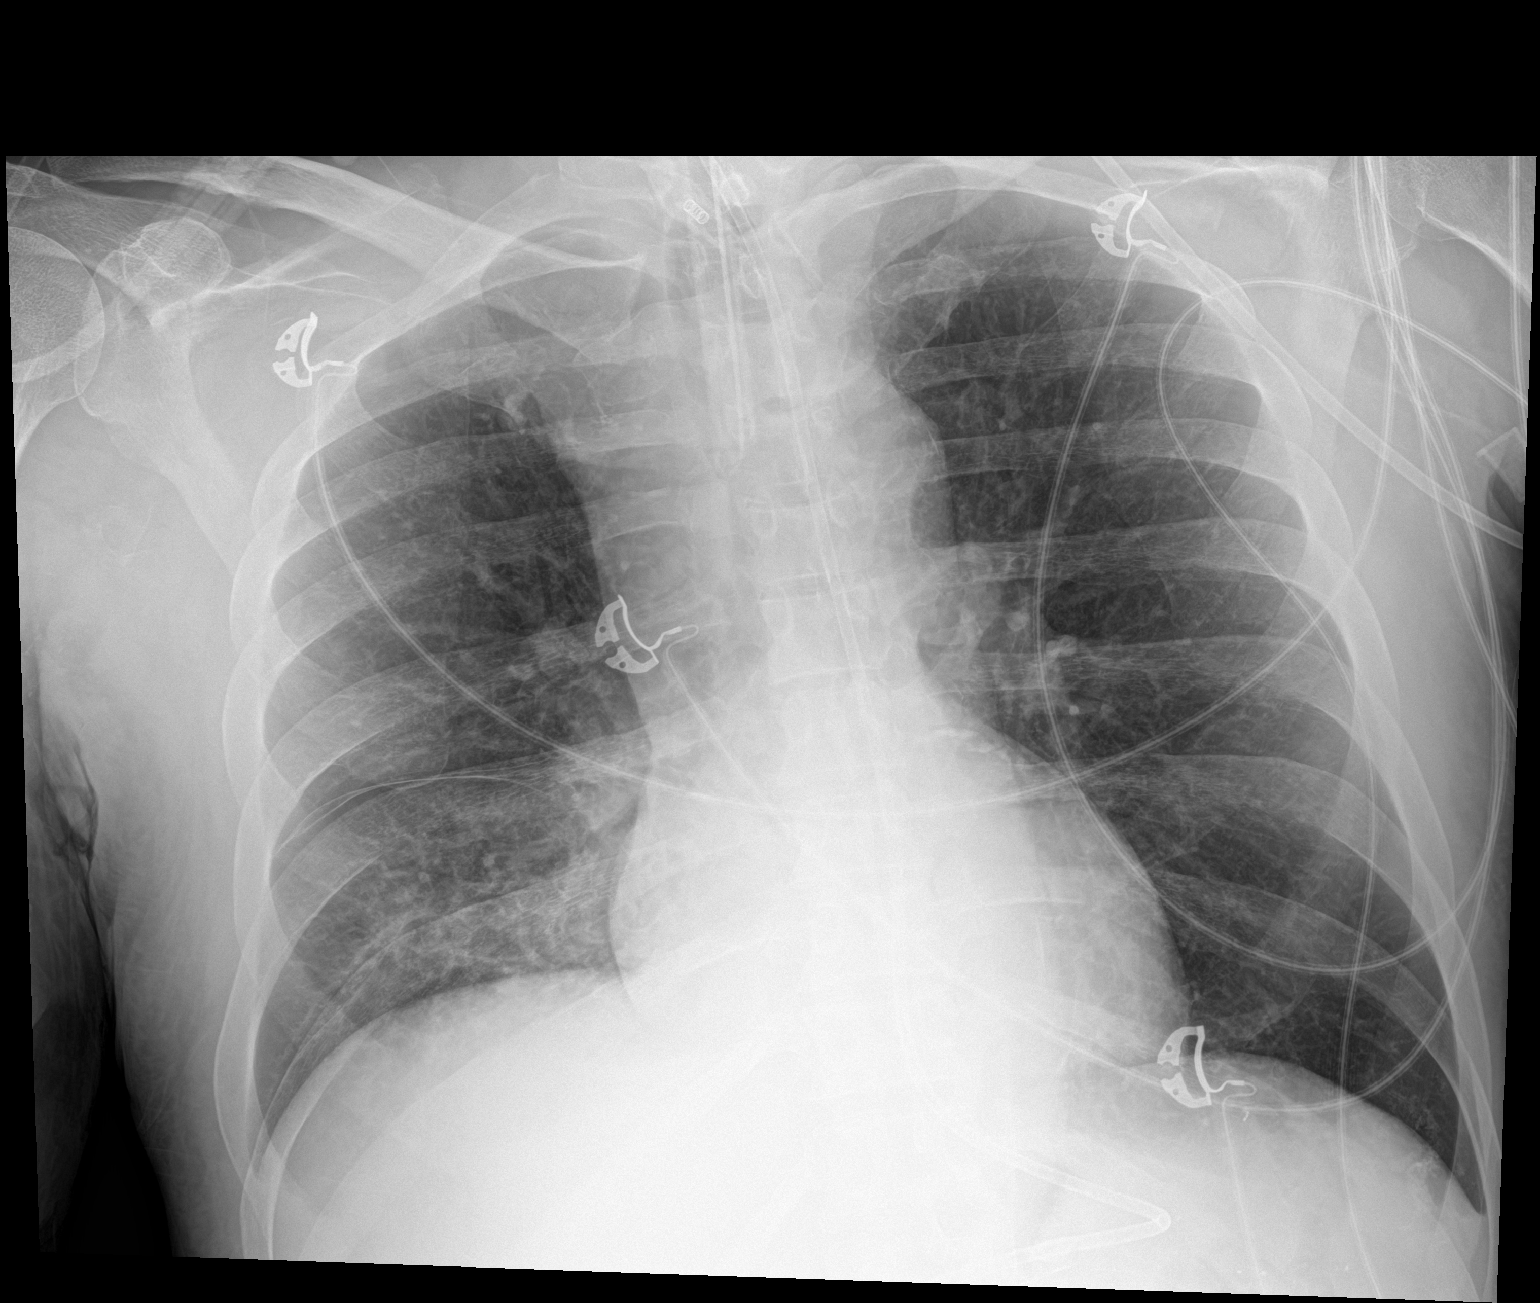

[1 of 1 positions shown; findings below may reference images not displayed]

FINDINGS: Endotracheal tube and feeding tube unchanged. Stable cardiac
silhouette. Mild improvement in RIGHT lower lobe opacity. No pleural
fluid. No pneumothorax.
IMPRESSION: 1. Stable support apparatus.
2. Improved RIGHT lower lobe atelectasis/infiltrate

## 2020-07-24 MED ORDER — ORAL CARE MOUTH RINSE
15.0000 mL | Freq: Four times a day (QID) | OROMUCOSAL | Status: DC
  Administered 2020-07-24: 15 mL via OROMUCOSAL
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
## 2020-07-24 NOTE — Progress Notes (Signed)
Pt placed on CPAP/PS 5/5. Pt is tolerating well, RT will continue to monitor.

## 2020-07-24 NOTE — Progress Notes (Signed)
[facility] Progress Note   Subjective:    Seen in ICU , off the vent now.  Weaning levo down to 1 ug/min.  Wt's up to baseline 68kg.     Objective Vitals:   07/24/20 1530 07/24/20 1545 07/24/20 1600 07/24/20 1615  BP: (!) 107/44 (!) 100/52 (!) 101/41 108/61  Pulse: 84 85 83 83  Resp: (!) 31 (!) 27 (!) 42 (!) 38  Temp:      TempSrc:      SpO2: 98% 96% 98% 98%  Weight:      Height:       Physical Exam   alert, nad   no jvd  Chest cta bilat  Cor reg no RG  Abd soft ntnd no ascites   Ext bilat BKA , no edema   Alert, NF, ox3   RUE AVF+bruit  Additional Objective Labs: Basic Metabolic Panel: Recent Labs  Lab 07/18/20 0346 07/18/20 1212 07/22/20 0400 07/23/20 0520 07/24/20 0236  NA 138   < > 135 136 132*  K 4.1   < > 5.2* 3.8 3.5  CL 96*   < > 97* 98 96*  CO2 22   < > 23 27 22  GLUCOSE 310*   < > 170* 148* 147*  BUN 65*   < > 67* 43* 61*  CREATININE 8.06*   < > 7.38* 5.54* 6.88*  CALCIUM 10.9*   < > 9.5 9.7 9.3  PHOS 7.1*  --   --   --  5.1*   < > = values in this interval not displayed.    Recent Labs  Lab 07/24/20 0236  AST 103*  ALT 106*  ALKPHOS 144*  BILITOT 0.4  PROT 7.5  ALBUMIN 2.6*    CBC: Recent Labs  Lab 07/19/20 0329 07/21/20 0437 07/22/20 0400 07/23/20 0520 07/24/20 0236  WBC 10.4 13.7* 16.8* 19.2* 18.6*  NEUTROABS  --   --   --  13.2*  --   HGB 11.7* 11.6* 11.2* 11.4* 10.9*  HCT 36.6* 36.3* 34.3* 35.8* 34.9*  MCV 94.1 96.3 95.3 96.0 98.0  PLT 158 182 180 179 161     Medications:  sodium chloride     sodium chloride Stopped (07/24/20 1533)   feeding supplement (NEPRO CARB STEADY) 1,000 mL (07/24/20 0515)   norepinephrine (LEVOPHED) Adult infusion Stopped (07/24/20 1529)   piperacillin-tazobactam (ZOSYN)  IV 100 mL/hr at 07/24/20 1600   sodium chloride     [START ON 07/25/2020] vancomycin      aspirin  81 mg Per Tube Daily   atorvastatin  40 mg Per Tube Daily   chlorhexidine gluconate (MEDLINE KIT)  15  mL Mouth Rinse BID   Chlorhexidine Gluconate Cloth  6 each Topical Q1200   docusate  100 mg Per Tube BID   feeding supplement (PROSource TF)  45 mL Per Tube TID   heparin injection (subcutaneous)  5,000 Units Subcutaneous Q8H   insulin aspart  0-15 Units Subcutaneous Q4H   insulin aspart  2 Units Subcutaneous Q4H   insulin glargine  10 Units Subcutaneous Daily   lanthanum  500 mg Per Tube TID WC   mouth rinse  15 mL Mouth Rinse QID   midodrine  10 mg Per Tube TID   pantoprazole sodium  40 mg Per Tube Daily   sodium chloride flush  10-40 mL Intracatheter Q12H    Dialysis: MWF [facility]  4h   68kg  2/2.5 bath  AVF  P2  Hep none -   Hecoral 107mg Iv q HD  Assessment/Plan:  # AMS - improving, multiple bilat CVA's, possibly some uremia. Doing much better now. Extubated now.   # Acute hypoxic resp failure. Intubated 7/7, then self-extubated on 7/9 and reintubated 7/11 and now extubated again.   # Staph hominis bacteremia/ RLL aspiration pna - Blood cx's 1/2 + for staph hominis on 7/06.  Had LP on 7/7 with possible meningitis/encephalitis. SP course of IV abx.  # Volume/ Hypotension: BP's improved some w/ fluid bolus yesterday. Levo almost off today. Keep even w/ HD tomorrow, no vol excess on exam or by wt  # ESRD:  HD per MWF schedule.   # Anemia of CKD no ESA needs. monitor trends. Hb 11  # Secondary hyperparathyroidism: hold VDRA for calcium for now.  Hyperphos - resumed home renvela. Switched to nepro for feeds  # T2DM - per primary team  # Hx ICH - head CT without acute abnormality multifactorial.     RSol Blazing MD 07/24/2020,4:34 PM [facility]

## 2020-07-24 NOTE — Procedures (Signed)
Extubation Procedure Note  Patient Details:   Name: Jorge [name] DOB: 08/27/1970 MRN: [id number]   Airway Documentation:    Vent end date: 07/24/20 Vent end time: 1125   Evaluation  O2 sats: stable throughout Complications: No apparent complications Patient did tolerate procedure well. Bilateral Breath Sounds: Rhonchi, Diminished   Yes  Pt extubated to 4 L [place] with RN at bedside. Positive cuff leak noted. Pt is tolerating well, RT will continue to monitor.  Cathie Olden 07/24/2020, 11:32 AM

## 2020-07-25 DIAGNOSIS — N186 End stage renal disease: Secondary | ICD-10-CM | POA: Diagnosis not present

## 2020-07-25 DIAGNOSIS — Z992 Dependence on renal dialysis: Secondary | ICD-10-CM | POA: Diagnosis not present

## 2020-07-25 DIAGNOSIS — G9340 Encephalopathy, unspecified: Secondary | ICD-10-CM | POA: Diagnosis not present

## 2020-07-25 DIAGNOSIS — R410 Disorientation, unspecified: Secondary | ICD-10-CM | POA: Diagnosis not present

## 2020-07-25 LAB — GLUCOSE, CAPILLARY
Glucose-Capillary: 99 mg/dL (ref 70–99)
Glucose-Capillary: 171 mg/dL — ABNORMAL HIGH (ref 70–99)
Glucose-Capillary: 173 mg/dL — ABNORMAL HIGH (ref 70–99)
Glucose-Capillary: 98 mg/dL (ref 70–99)
Glucose-Capillary: 184 mg/dL — ABNORMAL HIGH (ref 70–99)

## 2020-07-25 LAB — CBC
HCT: 30.6 % — ABNORMAL LOW (ref 39.0–52.0)
Hemoglobin: 10 g/dL — ABNORMAL LOW (ref 13.0–17.0)
MCH: 31.1 pg (ref 26.0–34.0)
MCHC: 32.7 g/dL (ref 30.0–36.0)
MCV: 95 fL (ref 80.0–100.0)
Platelets: 173 10*3/uL (ref 150–400)
RBC: 3.22 MIL/uL — ABNORMAL LOW (ref 4.22–5.81)
RDW: 16.1 % — ABNORMAL HIGH (ref 11.5–15.5)
WBC: 15.4 10*3/uL — ABNORMAL HIGH (ref 4.0–10.5)
nRBC: 0 % (ref 0.0–0.2)

## 2020-07-25 LAB — BASIC METABOLIC PANEL
Anion gap: 18 — ABNORMAL HIGH (ref 5–15)
BUN: 86 mg/dL — ABNORMAL HIGH (ref 6–20)
CO2: 20 mmol/L — ABNORMAL LOW (ref 22–32)
Calcium: 9.4 mg/dL (ref 8.9–10.3)
Chloride: 99 mmol/L (ref 98–111)
Creatinine, Ser: 9.23 mg/dL — ABNORMAL HIGH (ref 0.61–1.24)
GFR, Estimated: 6 mL/min — ABNORMAL LOW (ref >60)
Glucose, Bld: 147 mg/dL — ABNORMAL HIGH (ref 70–99)
Potassium: 3.4 mmol/L — ABNORMAL LOW (ref 3.5–5.1)
Sodium: 137 mmol/L (ref 135–145)

## 2020-07-25 LAB — MAGNESIUM: Magnesium: 2.6 mg/dL — ABNORMAL HIGH (ref 1.7–2.4)

## 2020-07-25 LAB — QUANTIFERON-TB GOLD PLUS (RQFGPL)
QuantiFERON Mitogen Value: >10 IU/mL
QuantiFERON Nil Value: 0.02 IU/mL
QuantiFERON TB1 Ag Value: 0 IU/mL
QuantiFERON TB2 Ag Value: 0.02 IU/mL

## 2020-07-25 LAB — QUANTIFERON-TB GOLD PLUS: QuantiFERON-TB Gold Plus: NEGATIVE

## 2020-07-25 LAB — PHOSPHORUS: Phosphorus: 5.8 mg/dL — ABNORMAL HIGH (ref 2.5–4.6)

## 2020-07-25 MED ORDER — ATORVASTATIN CALCIUM 40 MG PO TABS
40.0000 mg | ORAL_TABLET | Freq: Every day | ORAL | Status: DC
  Administered 2020-07-26 – 2020-07-29 (×4): 40 mg via ORAL
  Filled 2020-07-25 (×4): qty 1

## 2020-07-25 MED ORDER — MIDODRINE HCL 5 MG PO TABS
10.0000 mg | ORAL_TABLET | Freq: Three times a day (TID) | ORAL | Status: AC
  Administered 2020-07-25 (×2): 10 mg via ORAL
  Filled 2020-07-25 (×2): qty 2

## 2020-07-25 MED ORDER — ASPIRIN 81 MG PO CHEW: 81.0000 mg | CHEWABLE_TABLET | Freq: Every day | ORAL | Status: DC

## 2020-07-25 MED ORDER — LANTHANUM CARBONATE 500 MG PO CHEW
500.0000 mg | CHEWABLE_TABLET | Freq: Three times a day (TID) | ORAL | Status: DC
  Administered 2020-07-25 – 2020-07-29 (×11): 500 mg via ORAL
  Filled 2020-07-25 (×12): qty 1

## 2020-07-25 MED ORDER — INSULIN ASPART 100 UNIT/ML IJ SOLN
0.0000 [IU] | Freq: Three times a day (TID) | INTRAMUSCULAR | Status: DC
  Administered 2020-07-25 (×2): 3 [IU] via SUBCUTANEOUS
  Administered 2020-07-26: 2 [IU] via SUBCUTANEOUS
  Administered 2020-07-26: 5 [IU] via SUBCUTANEOUS
  Administered 2020-07-28 – 2020-07-29 (×2): 2 [IU] via SUBCUTANEOUS

## 2020-07-25 MED ORDER — ASPIRIN EC 81 MG PO TBEC
81.0000 mg | DELAYED_RELEASE_TABLET | Freq: Every day | ORAL | Status: DC
  Administered 2020-07-26 – 2020-07-29 (×4): 81 mg via ORAL
  Filled 2020-07-25 (×4): qty 1

## 2020-07-25 MED ORDER — PIPERACILLIN-TAZOBACTAM IN DEX 2-0.25 GM/50ML IV SOLN
2.2500 g | Freq: Three times a day (TID) | INTRAVENOUS | Status: DC
  Administered 2020-07-26 (×2): 2.25 g via INTRAVENOUS
  Filled 2020-07-25 (×2): qty 50

## 2020-07-25 MED ORDER — VANCOMYCIN HCL 750 MG/150ML IV SOLN
INTRAVENOUS | Status: AC
  Administered 2020-07-25: 750 mg
  Filled 2020-07-25: qty 150

## 2020-07-25 MED ORDER — NEPRO/CARBSTEADY PO LIQD
237.0000 mL | Freq: Three times a day (TID) | ORAL | Status: DC
  Administered 2020-07-25 – 2020-07-26 (×3): 237 mL via ORAL

## 2020-07-25 MED ORDER — RENA-VITE PO TABS
1.0000 | ORAL_TABLET | Freq: Every day | ORAL | Status: DC
  Administered 2020-07-25 – 2020-07-29 (×5): 1 via ORAL
  Filled 2020-07-25 (×5): qty 1

## 2020-07-25 NOTE — Progress Notes (Signed)
OT Cancellation Note  Patient Details Name: Jorge [name] MRN: [id number] DOB: 03-06-1970   Cancelled Treatment:    Reason Eval/Treat Not Completed: Patient at procedure or test/ unavailable; off floor for HD. OT to check back as time allows.   Gloris Manchester OTR/L Supplemental OT, Department of rehab services [phone]  Cian Costanzo R H. 07/25/2020, 2:51 PM

## 2020-07-25 NOTE — Progress Notes (Signed)
Patient ID: Jorge [name], male   DOB: May 16, 1970, 50 y.o.   MRN: [id number] S: No events overnight.  No complaints O:BP (!) 149/48   Pulse 74   Temp 99.1 F (37.3 C) (Axillary)   Resp (!) 35   Ht '5\' 11"'$  (1.803 m)   Wt 68.1 kg   SpO2 97%   BMI 20.94 kg/m   Intake/Output Summary (Last 24 hours) at 07/25/2020 1011 Last data filed at 07/24/2020 1900 Gross per 24 hour  Intake 515.21 ml  Output --  Net 515.21 ml   Intake/Output: I/O last 3 completed shifts: In: 2363.9 [I.V.:368.2; NG/GT:1620; IV Piggyback:375.8] Out: -   Intake/Output this shift:  No intake/output data recorded. Weight change:  Gen:NAD CVS: RRR Resp: CTA Abd: +BS, soft, NT/ND Ext: s/p bilateral BKA's, RUE AVF +T/B  Recent Labs  Lab 07/19/20 0329 07/20/20 0341 07/21/20 0437 07/22/20 0400 07/23/20 0520 07/24/20 0236 07/25/20 0620  NA 139 140 140 135 136 132* 137  K 4.0 4.0 3.7 5.2* 3.8 3.5 3.4*  CL 98 98 101 97* 98 96* 99  CO2 '23 25 27 23 27 22 '$ 20*  GLUCOSE 228* 166* 100* 170* 148* 147* 147*  BUN 84* 55* 41* 67* 43* 61* 86*  CREATININE 9.57* 7.06* 5.33* 7.38* 5.54* 6.88* 9.23*  ALBUMIN  --   --   --   --   --  2.6*  --   CALCIUM 10.9* 10.4* 9.7 9.5 9.7 9.3 9.4  PHOS  --   --   --   --   --  5.1* 5.8*  AST  --   --   --   --   --  103*  --   ALT  --   --   --   --   --  106*  --    Liver Function Tests: Recent Labs  Lab 07/24/20 0236  AST 103*  ALT 106*  ALKPHOS 144*  BILITOT 0.4  PROT 7.5  ALBUMIN 2.6*   No results for input(s): LIPASE, AMYLASE in the last 168 hours. No results for input(s): AMMONIA in the last 168 hours. CBC: Recent Labs  Lab 07/21/20 0437 07/22/20 0400 07/23/20 0520 07/24/20 0236 07/25/20 0620  WBC 13.7* 16.8* 19.2* 18.6* 15.4*  NEUTROABS  --   --  13.2*  --   --   HGB 11.6* 11.2* 11.4* 10.9* 10.0*  HCT 36.3* 34.3* 35.8* 34.9* 30.6*  MCV 96.3 95.3 96.0 98.0 95.0  PLT 182 180 179 161 173   Cardiac Enzymes: No results for input(s): CKTOTAL, CKMB, CKMBINDEX,  TROPONINI in the last 168 hours. CBG: Recent Labs  Lab 07/24/20 1519 07/24/20 1918 07/24/20 2336 07/25/20 0323 07/25/20 0729  GLUCAP 108* 111* 117* 99 171*    Iron Studies: No results for input(s): IRON, TIBC, TRANSFERRIN, FERRITIN in the last 72 hours. Studies/Results: DG Chest Port 1 View  Result Date: 07/24/2020 CLINICAL DATA:  Reason for exam pneumonia EXAM: PORTABLE CHEST 1 VIEW COMPARISON:  07/23/2020 FINDINGS: Endotracheal tube and feeding tube unchanged. Stable cardiac silhouette. Mild improvement in RIGHT lower lobe opacity. No pleural fluid. No pneumothorax. IMPRESSION: 1. Stable support apparatus. 2. Improved RIGHT lower lobe atelectasis/infiltrate Electronically Signed   By: Suzy Bouchard M.D.   On: 07/24/2020 08:13   DG Chest Port 1 View  Result Date: 07/23/2020 CLINICAL DATA:  Respirator dependent. EXAM: PORTABLE CHEST 1 VIEW COMPARISON:  July 18, 2020 FINDINGS: Endotracheal tube and enteric catheter in satisfactory position, where visualized. Cardiomediastinal silhouette is  normal. Mediastinal contours appear intact. Right lower lung field airspace consolidation, more prominent radiographically than on July 18, 2020. No evidence of pleural effusion or pneumothorax. Osseous structures are without acute abnormality. Soft tissues are grossly normal. IMPRESSION: Right lower lung field airspace consolidation, more prominent radiographically than on July 18, 2020. Electronically Signed   By: Fidela Salisbury M.D.   On: 07/23/2020 12:46    aspirin  81 mg Per Tube Daily   atorvastatin  40 mg Per Tube Daily   Chlorhexidine Gluconate Cloth  6 each Topical Q1200   docusate  100 mg Per Tube BID   heparin injection (subcutaneous)  5,000 Units Subcutaneous Q8H   insulin aspart  0-15 Units Subcutaneous Q4H   insulin aspart  2 Units Subcutaneous Q4H   insulin glargine  10 Units Subcutaneous Daily   lanthanum  500 mg Per Tube TID WC   midodrine  10 mg Per Tube TID   pantoprazole  sodium  40 mg Per Tube Daily   sodium chloride flush  10-40 mL Intracatheter Q12H    BMET    Component Value Date/Time   NA 137 07/25/2020 0620   NA 136 (A) 07/10/2017 0000   K 3.4 (L) 07/25/2020 0620   CL 99 07/25/2020 0620   CO2 20 (L) 07/25/2020 0620   GLUCOSE 147 (H) 07/25/2020 0620   BUN 86 (H) 07/25/2020 0620   BUN 51 (A) 07/10/2017 0000   CREATININE 9.23 (H) 07/25/2020 0620   CALCIUM 9.4 07/25/2020 0620   GFRNONAA 6 (L) 07/25/2020 0620   GFRAA 8 (L) 10/10/2019 0730   CBC    Component Value Date/Time   WBC 15.4 (H) 07/25/2020 0620   RBC 3.22 (L) 07/25/2020 0620   HGB 10.0 (L) 07/25/2020 0620   HCT 30.6 (L) 07/25/2020 0620   PLT 173 07/25/2020 0620   MCV 95.0 07/25/2020 0620   MCH 31.1 07/25/2020 0620   MCHC 32.7 07/25/2020 0620   RDW 16.1 (H) 07/25/2020 0620   LYMPHSABS 2.5 07/23/2020 0520   MONOABS 2.0 (H) 07/23/2020 0520   EOSABS 1.2 (H) 07/23/2020 0520   BASOSABS 0.1 07/23/2020 0520   Dialysis: MWF [place]  4h   68kg  2/2.5 bath  AVF  P2  Hep none - Hecoral 63mg Iv q HD  Assessment/Plan:  ESRD - plan for HD today without UF to help prevent any drops in BP. AMS - multiple bilateral CVA's. Neuro following. Staph hominis bacteremia/aspiration PNA - completed abx.  Possible meningitis/encephalitis.   Acute hypoxic respiratory failure - due to RLL PNA, improved and off vent. HTN/Volume - stable Anemia of ESRD - stable CKD-BMD - restarted phosphate binders.  Holding vit D due to ^ calcium.  [name] MD [employer]([phone]

## 2020-07-25 NOTE — Progress Notes (Signed)
Nutrition Follow-up  DOCUMENTATION CODES:   Non-severe (moderate) malnutrition in context of chronic illness  INTERVENTION:   - Nepro Shake po TID, each supplement provides 425 kcal and 19 grams protein  - Encourage PO intake  - Recommend renal MVI daily  NUTRITION DIAGNOSIS:   Moderate Malnutrition related to chronic illness (ERSD) as evidenced by moderate fat depletion, moderate muscle depletion.  Ongoing, being addressed via diet advancement and oral nutrition supplements  GOAL:   Patient will meet greater than or equal to 90% of their needs  Progressing  MONITOR:   PO intake, Supplement acceptance, Diet advancement, Labs, Weight trends, I & O's  REASON FOR ASSESSMENT:   Ventilator, Consult Enteral/tube feeding initiation and management  ASSESSMENT:   50 year old male who presented to the ED on 7/06 with AMS. PMH of ESRD on HD, T2DM, gastroparesis, remote CVA, HTN, bilateral BKAs, ICH, TIA, HLD. Pt admitted with hypertensive urgency, hyperkalemia, hypoxia.  7/07 - intubated, NG tube placed, s/p lumbar puncture 7/08 - Cortrak placed (tip gastric) 7/09 - pt self-extubated 7/11 - intubated 7/14 - TEE 7/17 - extubated 7/18 - diet advanced to dysphagia 3 with nectar-thick liquids  Discussed pt with RN and during ICU rounds. Pt requesting to eat and stating that he is very hungry. Per MD, discontinue tube feeds at this time but leave Cortrak in until adequacy of PO intake has been established. RD will monitor PO intake and assess for need for nocturnal tube feeds if PO intake during the day is inadequate.  Spoke with pt at bedside. Pt confused but reports that he feels very hungry. RN at bedside disconnecting pt's Cortrak from tube feeds and pump. Pt states that he will try to drink Nepro shakes although he has had GI issues with these in the past. Discussed with pt the importance of PO intake and supplement consumption in order to remove Cortrak.  Plan is for HD today  without UF to prevent any drops in BP. Phosphate binders are ordered.  Admit weight: 68.6 kg Current weight: 68.1 kg EDW: 68 kg  Medications reviewed and include: colace, SSI q 4 hours, novolog 2 units q 4 hours, lantus 10 units daily, fosrenol, protonix, IV abx  Labs reviewed: potassium 3.4, phosphorus 5.8, magnesium 2.6, elevated LFTs, hemoglobin 10.0 CBG's: 99-171 x 24 hours  Diet Order:   Diet Order             DIET DYS 3 Room service appropriate? Yes with Assist; Fluid consistency: Nectar Thick  Diet effective now                   EDUCATION NEEDS:   Education needs have been addressed  Skin:  Skin Assessment: Reviewed RN Assessment  Last BM:  07/25/20 small type 7 via rectal tube  Height:   Ht Readings from Last 1 Encounters:  07/18/20 '5\' 11"'$  (1.803 m)    Weight:   Wt Readings from Last 1 Encounters:  07/24/20 68.1 kg    Ideal Body Weight:  68 kg (adjusted for bilateral BKA)  BMI:  Body mass index is 20.94 kg/m.  Adjusted BMI: 24 kg/m  Estimated Nutritional Needs:   Kcal:  2100-2300  Protein:  110-130 grams  Fluid:  UOP + 1000 ml    Gustavus Bryant, MS, RD, LDN Inpatient Clinical Dietitian Please see AMiON for contact information.

## 2020-07-25 NOTE — Progress Notes (Signed)
Physical Therapy Evaluation Patient Details Name: Jorge [name] MRN: [id number] DOB: 1970/11/28 Today's Date: 07/25/2020   History of Present Illness  Jorge [name] is a 50 y.o. male admitted 07/13/20 with confusion during dialysis (incomplete session secondary to him pulling at lines), found to be volume overloaded, with labile blood pressures (initially normotensive, but hypertensive into the 200s with worsening altered mental status).  Due to continued agitation he was intubated to facilitate work-up for his altered mental status. ETT 7/7- 7/9 (self-extubated); 7/11-7/17. Cortrak 7/8.   MRI brain 7/7: 4 small acute infarctions, left cerebellar Kamali Nephew matter, right posteroinferior parietal Trisha Morandi matter and 2 in the left frontal Chonte Ricke matter. On 7/11, pt was noted to be progressively encephalopathic with weakness, left gaze preference and decreased consciousness (GCS 8).  MRI brain 7/12: New acute infarct left centrum semiovale. Other evolving small areas of infarction on the prior study.  PMH: HTN, hyperlipidemia, diabetes, end-stage renal disease on peritoneal dialysis, right basal ganglia ICH with IVH and subarachnoid hemorrhage (hypertensive bleed, 10/03/2019), bilateral BKA  Clinical Impression  Pt admitted with above diagnosis. Pt was able to sit EOB for 20 min with min guard assist and place bil prostheses on on his own. Pt needed mod assist to stand and mod of 2 assist to stand and pivot to chair with RW with poor safety awareness and poor posture leaning anteriorly and definite need for UE support as well as external support of therapist. Pt needs therapy to reach prior functional level.  Pt confused as well.  Pt currently with functional limitations due to the deficits listed below (see PT Problem List). Pt will benefit from skilled PT to increase their independence and safety with mobility to allow discharge to the venue listed below.       Follow Up Recommendations SNF;Supervision/Assistance -  24 hour    Equipment Recommendations  None recommended by PT    Recommendations for Other Services       Precautions / Restrictions Precautions Precautions: Fall Precaution Comments: Bil LE prostheses, Cortrak Restrictions Weight Bearing Restrictions: No      Mobility  Bed Mobility Overal bed mobility: Independent             General bed mobility comments: Pt able to come to long sit without assist.    Transfers Overall transfer level: Needs assistance Equipment used: Rolling walker (2 wheeled) Transfers: Sit to/from [facility] Sit to Stand: Mod assist;+2 safety/equipment Stand pivot transfers: Mod assist;+2 physical assistance       General transfer comment: Pt able to place his prostheses on without assist from CI. Pt did need assist to get them locked into place in standing with 2 standing attempts for making sure they were locked. Pt with anterior lean and difficulty achieveing full upright stance/getting his balance once up on bil prostheses.Pt was able to step around to chair with use of RW for support but needing mod assist for stability as pt with poor balance due to anterior lean.  Multiple cues for safety awareness and not following commands to stand upright.  Ambulation/Gait                Stairs            Wheelchair Mobility    Modified Rankin (Stroke Patients Only) Modified Rankin (Stroke Patients Only) Pre-Morbid Rankin Score: Moderate disability Modified Rankin: Severe disability     Balance Overall balance assessment: Needs assistance Sitting-balance support: No upper extremity supported;Feet supported;Bilateral upper extremity supported  Sitting balance-Leahy Scale: Fair Sitting balance - Comments: Pt able to sit EOB withmin guard assist for 20 minutes without Ue support at times as he was able to put on prostheses without PT assist.   Standing balance support: Bilateral upper extremity supported;During  functional activity Standing balance-Leahy Scale: Poor Standing balance comment: Pt needed min assist to stand statically and mod assist for dynamic balance with RW and external support. Very unsteady dynamically.                             Pertinent Vitals/Pain Pain Assessment: No/denies pain    Home Living   Living Arrangements: Other relatives;Children (stays with sister who works, children work) Available Help at Discharge: Available PRN/intermittently;Family Type of Home: House Home Access: Stairs to enter Entrance Stairs-Rails: None Entrance Stairs-Number of Steps: 1 Home Layout: Two level;Able to live on main level with bedroom/bathroom Home Equipment: Gilford Rile - 2 wheels;Shower seat Additional Comments: Bil Prosthetics    Prior Function Level of Independence: Independent with assistive device(s)         Comments: pt reports ambulating limited community distances with use of prosthetics and RW     Hand Dominance   Dominant Hand: Left    Extremity/Trunk Assessment   Upper Extremity Assessment Upper Extremity Assessment: Defer to OT evaluation    Lower Extremity Assessment Lower Extremity Assessment: Generalized weakness RLE Deficits / Details: hip and knee appear WFL LLE Deficits / Details: Hip and knee appear WFL    Cervical / Trunk Assessment Cervical / Trunk Assessment: Normal  Communication   Communication: Other (comment) (Speaks softly)  Cognition Arousal/Alertness: Awake/alert Behavior During Therapy: Flat affect Overall Cognitive Status: Impaired/Different from baseline Area of Impairment: Orientation;Memory;Following commands;Safety/judgement;Awareness;Problem solving                 Orientation Level: Disoriented to;Place;Time;Situation   Memory: Decreased recall of precautions;Decreased short-term memory Following Commands: Follows one step commands inconsistently;Follows one step commands with increased  time Safety/Judgement: Decreased awareness of safety;Decreased awareness of deficits   Problem Solving: Slow processing;Difficulty sequencing;Decreased initiation;Requires verbal cues;Requires tactile cues General Comments: Pt telling PT multiple times that he doesnt know when his right LE was amputated. In chart, pt had bil LE amputations in 3/21 at the same time. Pt unaware that right LE was amputated so long ago. Pt with left gaze preference as well as decr awareness of right hemibody. Pt confused as well as to place as he appeared tothink he was at home once he was in chair. He leaned over and spit in the floor and stated, "it will soak up in the pan."  Pt with confusion throughout session.      General Comments      Exercises     Assessment/Plan    PT Assessment Patient needs continued PT services  PT Problem List Decreased activity tolerance;Decreased balance;Decreased mobility;Decreased knowledge of use of DME;Decreased safety awareness;Decreased cognition       PT Treatment Interventions DME instruction;Gait training;Functional mobility training;Therapeutic activities;Therapeutic exercise;Balance training;Patient/family education    PT Goals (Current goals can be found in the Care Plan section)  Acute Rehab PT Goals Patient Stated Goal: to go home PT Goal Formulation: With patient Time For Goal Achievement: 08/08/20 Potential to Achieve Goals: Good    Frequency Min 3X/week   Barriers to discharge        Co-evaluation  AM-PAC PT "6 Clicks" Mobility  Outcome Measure Help needed turning from your back to your side while in a flat bed without using bedrails?: None Help needed moving from lying on your back to sitting on the side of a flat bed without using bedrails?: None Help needed moving to and from a bed to a chair (including a wheelchair)?: Total Help needed standing up from a chair using your arms (e.g., wheelchair or bedside chair)?: A Lot Help  needed to walk in hospital room?: Total Help needed climbing 3-5 steps with a railing? : Total 6 Click Score: 13    End of Session Equipment Utilized During Treatment: Gait belt Activity Tolerance: Patient limited by fatigue Patient left: in chair;with call bell/phone within reach;with chair alarm set Nurse Communication: Mobility status;Need for lift equipment (use Stedy to return pt to bed.) PT Visit Diagnosis: Unsteadiness on feet (R26.81);Muscle weakness (generalized) (M62.81)    Time:  -      Charges:              Anarely Nicholls M,PT Acute Rehab Services [phone] (pager)   Alvira Philips 07/25/2020, 1:18 PM

## 2020-07-25 NOTE — Progress Notes (Addendum)
  Speech Language Pathology Treatment: Dysphagia  Patient Details Name: Jorge [name] MRN: [id number] DOB: 10/09/1970 Today's Date: 07/25/2020 Time: [id number] SLP Time Calculation (min) (ACUTE ONLY): 22 min  Assessment / Plan / Recommendation Clinical Impression  Pt given encouragement to participate and warmed up to idea of po's and therapist. He coughed after several sips thin and frequency increased after cracker and as he verbalized during mastication and swallows. Compared to yesterday's documentation, vocal quality is stronger with less hoarseness with strong volitional cough. Prolonged intubation and neuro impairments places him at increased risk. SLP will start him on Dys 3, thin requiring assist for precautions to maximize safety. Discussed compensation needed with RN and to notify SLP today if coughing increases as he may require instrumentation to objectify swallow.   Addendum: PT worked with Jorge [name] immediately following and reported he had significant drooling throughout session, expectorated on the floor. SLP will modify liquids to nectar thick, continue Dys 3 and will follow up tomorrow.    HPI HPI: Pt is a 51 y.o. male admitted 07/13/2020 with AMS and volume overload and was intubated 7/7. MRI brain 7/7: 4 small acute infarctions, left cerebellar white matter, right posteroinferior parietal white matter and 2 in the left frontal white matter. On 7/11, pt was noted to be progressively encephalopathic with weakness, left gaze preference and decreased consciousness (GCS 8). MRI brain 7/12: New acute infarct left centrum semiovale. Other evolving small areas of infarction on the prior study. Cortrak placed 7/8. ETT 7/7- 7/9 (self-extubated); 7/11-7/17 (1125). CXR on admission 7/16: No definite acute cardiopulmonary process. CXR on date of eval 7/17: Improved RIGHT lower lobe atelectasis/infiltrate. PMH: HTN, T2DM, ESRD (on HD MWF), bilateral BKA, ICH, TIA.      SLP Plan  Continue with  current plan of care       Recommendations  Diet recommendations: Dysphagia 3 (mechanical soft);Thin liquid Liquids provided via: Cup;Straw Medication Administration: Whole meds with puree Supervision: Patient able to self feed;Intermittent supervision to cue for compensatory strategies Compensations: Slow rate;Small sips/bites;Clear throat intermittently Postural Changes and/or Swallow Maneuvers: Seated upright 90 degrees                Oral Care Recommendations: Oral care BID Follow up Recommendations: None (TBD) SLP Visit Diagnosis: Dysphagia, unspecified (R13.10) Plan: Continue with current plan of care                      Houston Siren 07/25/2020, 9:34 AM   Orbie Pyo Colvin Caroli.Ed Risk analyst [phone] Office [phone]
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
## 2020-07-26 DIAGNOSIS — E1159 Type 2 diabetes mellitus with other circulatory complications: Secondary | ICD-10-CM | POA: Diagnosis not present

## 2020-07-26 DIAGNOSIS — R6521 Severe sepsis with septic shock: Secondary | ICD-10-CM

## 2020-07-26 DIAGNOSIS — D638 Anemia in other chronic diseases classified elsewhere: Secondary | ICD-10-CM | POA: Diagnosis not present

## 2020-07-26 DIAGNOSIS — E1169 Type 2 diabetes mellitus with other specified complication: Secondary | ICD-10-CM | POA: Diagnosis not present

## 2020-07-26 DIAGNOSIS — N186 End stage renal disease: Secondary | ICD-10-CM | POA: Diagnosis not present

## 2020-07-26 DIAGNOSIS — A419 Sepsis, unspecified organism: Principal | ICD-10-CM

## 2020-07-26 LAB — CBC
HCT: 32.6 % — ABNORMAL LOW (ref 39.0–52.0)
Hemoglobin: 10.5 g/dL — ABNORMAL LOW (ref 13.0–17.0)
MCH: 30.7 pg (ref 26.0–34.0)
MCHC: 32.2 g/dL (ref 30.0–36.0)
MCV: 95.3 fL (ref 80.0–100.0)
Platelets: 185 10*3/uL (ref 150–400)
RBC: 3.42 MIL/uL — ABNORMAL LOW (ref 4.22–5.81)
RDW: 15.9 % — ABNORMAL HIGH (ref 11.5–15.5)
WBC: 10.5 10*3/uL (ref 4.0–10.5)
nRBC: 0 % (ref 0.0–0.2)

## 2020-07-26 LAB — BASIC METABOLIC PANEL
Anion gap: 15 (ref 5–15)
BUN: 34 mg/dL — ABNORMAL HIGH (ref 6–20)
CO2: 26 mmol/L (ref 22–32)
Calcium: 9 mg/dL (ref 8.9–10.3)
Chloride: 98 mmol/L (ref 98–111)
Creatinine, Ser: 5.52 mg/dL — ABNORMAL HIGH (ref 0.61–1.24)
GFR, Estimated: 12 mL/min — ABNORMAL LOW (ref >60)
Glucose, Bld: 148 mg/dL — ABNORMAL HIGH (ref 70–99)
Potassium: 3.4 mmol/L — ABNORMAL LOW (ref 3.5–5.1)
Sodium: 139 mmol/L (ref 135–145)

## 2020-07-26 LAB — CULTURE, RESPIRATORY W GRAM STAIN

## 2020-07-26 LAB — GLUCOSE, CAPILLARY
Glucose-Capillary: 145 mg/dL — ABNORMAL HIGH (ref 70–99)
Glucose-Capillary: 250 mg/dL — ABNORMAL HIGH (ref 70–99)
Glucose-Capillary: 120 mg/dL — ABNORMAL HIGH (ref 70–99)
Glucose-Capillary: 197 mg/dL — ABNORMAL HIGH (ref 70–99)

## 2020-07-26 MED ORDER — PROSOURCE PLUS PO LIQD
30.0000 mL | Freq: Two times a day (BID) | ORAL | Status: DC
  Administered 2020-07-26 – 2020-07-29 (×5): 30 mL via ORAL
  Filled 2020-07-26 (×6): qty 30

## 2020-07-26 MED ORDER — NUTRISOURCE FIBER PO PACK
1.0000 | PACK | Freq: Two times a day (BID) | ORAL | Status: DC
  Administered 2020-07-26 – 2020-07-29 (×6): 1 via ORAL
  Filled 2020-07-26 (×9): qty 1

## 2020-07-26 NOTE — Progress Notes (Signed)
PROGRESS NOTE    Jorge [name]  [id number] DOB: July 10, 1970 DOA: 07/13/2020 PCP: Lauree Chandler, NP   Brief Narrative:  Jorge [name] is a 50 y.o. male with PMHx significant for HTN, T2DM, ESRD (on HD MWF), bilateral BKA, ICH, TIA admitted 07/13/2020 with AMS and volume overload, patient presented from dialysis after 1 hour he became confused pulling at lines somewhat combative noted to be hypertensive and hypoxic with worsening mental status in the ED upon presentation on 07/13/2020.  Initially admitted to the floor with worsening mental status deterioration concern for airway protection and was intubated on 07/14/2020.  Patient self extubated on 07/16/2020, stabilizing over the past week.  Patient noted to have gaze preference with a GCS of 8 and unilateral weakness code stroke was called -CT head showed small subacute infarcts previously documented -PCCM reconsulted on the 11th to reintubate the patient given worsening clinical picture and airway compromise.  TEE on the 14th showed a small PFO, concern for HCAP versus aspiration pneumonia in the interim vancomycin and Zosyn initiated 07/23/2020-de-escalate to vancomycin only given sputum cultures positive for MRSA as of 07/26/2020.  Subjective Patient's mental status continues to be somewhat labile but generally improving per report.  No acute issues or events otherwise noted overnight, review of systems markedly limited per discussion with patient but denies nausea vomiting chest pain shortness of breath headache fevers or chills.  Assessment & Plan:   Principal Problem:   AMS (altered mental status) Active Problems:   ESRD (end stage renal disease) on dialysis ([place])   Hypertension associated with diabetes ([place])   Type 2 diabetes mellitus with chronic kidney disease on chronic dialysis, with long-term current use of insulin ([name])   Anemia of chronic disease   Acute encephalopathy   Hyperlipidemia associated with type 2 diabetes mellitus  ([name])   Hypertensive urgency   Malnutrition of moderate degree   Acute metabolic encephalopathy, multifactorial, improving Multiple bilateral CVA, acute, contributing to his encephalopathy -Continue aspirin, statin -MRI shows - new acute infarct left centrum semiovale. Other evolving small areas of infarction on the prior study.  This is concerning for recrudescence of old strokes on top of apparent new stroke likely secondary to hypertensive emergency as below as well as profound hypoxia    Acute hypoxemic respiratory failure secondary to encephalopathy Suspected right lower lobe HCAP versus aspiration -Successfully extubated on 07/24/2020 currently on room air  -Continues on vancomycin only given MRSA positive sputum cultures -Concern for aspiration in house given requirement for reintubation and mental status changes as outlined above in the setting of acute stroke   Sepsis, multifactorial, POA Septic shock Right lower lobe suspected aspiration pneumonia, POA Recurrent aspiration event with worsening right lower lobe infiltrate Rule out bacteremia - Culture 7/6 staph hominis, then repeat culture with staph auricularis in 1/4. Unclear significance, central line removed 7/15 and TEE reassuring. 7/16 respiratory culture growing staph aureus - Empiric vancomycin, Zosyn added on 7/16, narrowed to vancomycin only 7/19 - Off pressors since 07/24/2020   History of hypertension -Holding antihypertensives in the setting of shock -Continue to follow  ESRD on iHD -Appreciate nephrology management, intermittent HD MWF, now at dry weight -Following BMP, I/O   T2DM with hyperglycemia -Lantus as ordered -Sliding scale insulin protocol -Dysphagia 3 diet, stop tube feeds -pending p.o. diet tolerance we will pull NG tube in the next 24 to 48 hours   DVT prophylaxis: Heparin Code Status: Full Family Communication: None present -sister unavailable by phone, voicemail left  Status is:  Inpatient  [name]: The patient is from: Home              Anticipated d/c is to: To be determined              Anticipated d/c date is: > 72 hours              Patient currently not medically stable for discharge  Consultants:  PCCM, nephrology  Antimicrobials:  Vancomycin ongoing  Objective: Vitals:   07/25/20 2332 07/26/20 0000 07/26/20 0339 07/26/20 0554  BP:      Pulse:  82    Resp:  17    Temp: 98.7 F (37.1 C)  99.3 F (37.4 C)   TempSrc: Oral  Axillary   SpO2:  91%    Weight:    66.2 kg  Height:        Intake/Output Summary (Last 24 hours) at 07/26/2020 0724 Last data filed at 07/25/2020 1742 Gross per 24 hour  Intake 225 ml  Output 0 ml  Net 225 ml   Filed Weights   07/24/20 0332 07/25/20 1357 07/26/20 0554  Weight: 68.1 kg 65.4 kg 66.2 kg    Examination:  General:  Pleasantly resting in bed, No acute distress. HEENT:  Normocephalic atraumatic.  Sclerae nonicteric, noninjected.  Extraocular movements intact bilaterally.  NG tube intact Neck:  Without mass or deformity.  Trachea is midline. Lungs:  Clear to auscultate bilaterally without rhonchi, wheeze, or rales. Heart:  Regular rate and rhythm.  Without murmurs, rubs, or gallops. Abdomen:  Soft, nontender, nondistended.  Without guarding or rebound. Extremities: Without cyanosis, clubbing, edema, or obvious deformity.  Fecal tube intact draining watery stool.  Right upper extremity dialysis fistula noted Vascular:  Dorsalis pedis and posterior tibial pulses palpable bilaterally. Skin:  Warm and dry, no erythema, no ulcerations.  Data Reviewed: I have personally reviewed following labs and imaging studies  CBC: Recent Labs  Lab 07/22/20 0400 07/23/20 0520 07/24/20 0236 07/25/20 0620 07/26/20 0419  WBC 16.8* 19.2* 18.6* 15.4* 10.5  NEUTROABS  --  13.2*  --   --   --   HGB 11.2* 11.4* 10.9* 10.0* 10.5*  HCT 34.3* 35.8* 34.9* 30.6* 32.6*  MCV 95.3 96.0 98.0 95.0 95.3  PLT 180 179 161 173 [id number]    Basic Metabolic Panel: Recent Labs  Lab 07/20/20 0341 07/21/20 0437 07/22/20 0400 07/23/20 0520 07/24/20 0236 07/25/20 0620 07/26/20 0419  NA 140   < > 135 136 132* 137 139  K 4.0   < > 5.2* 3.8 3.5 3.4* 3.4*  CL 98   < > 97* 98 96* 99 98  CO2 25   < > '23 27 22 '$ 20* 26  GLUCOSE 166*   < > 170* 148* 147* 147* 148*  BUN 55*   < > 67* 43* 61* 86* 34*  CREATININE 7.06*   < > 7.38* 5.54* 6.88* 9.23* 5.52*  CALCIUM 10.4*   < > 9.5 9.7 9.3 9.4 9.0  MG 2.8*  --   --  2.2 2.3 2.6*  --   PHOS  --   --   --   --  5.1* 5.8*  --    < > = values in this interval not displayed.   GFR: Estimated Creatinine Clearance: 14.7 mL/min (A) (by C-G formula based on SCr of 5.52 mg/dL (H)). Liver Function Tests: Recent Labs  Lab 07/24/20 0236  AST 103*  ALT 106*  ALKPHOS 144*  BILITOT 0.4  PROT 7.5  ALBUMIN 2.6*   No results for input(s): LIPASE, AMYLASE in the last 168 hours. No results for input(s): AMMONIA in the last 168 hours. Coagulation Profile: No results for input(s): INR, PROTIME in the last 168 hours. Cardiac Enzymes: No results for input(s): CKTOTAL, CKMB, CKMBINDEX, TROPONINI in the last 168 hours. BNP (last 3 results) No results for input(s): PROBNP in the last 8760 hours. HbA1C: No results for input(s): HGBA1C in the last 72 hours. CBG: Recent Labs  Lab 07/25/20 0323 07/25/20 0729 07/25/20 1115 07/25/20 1825 07/25/20 2114  GLUCAP 99 171* 173* 98 184*   Lipid Profile: No results for input(s): CHOL, HDL, LDLCALC, TRIG, CHOLHDL, LDLDIRECT in the last 72 hours. Thyroid Function Tests: No results for input(s): TSH, T4TOTAL, FREET4, T3FREE, THYROIDAB in the last 72 hours. Anemia Panel: No results for input(s): VITAMINB12, FOLATE, FERRITIN, TIBC, IRON, RETICCTPCT in the last 72 hours. Sepsis Labs: Recent Labs  Lab 07/24/20 0236  LATICACIDVEN 1.6    Recent Results (from the past 240 hour(s))  Culture, blood (routine x 2)     Status: None   Collection Time:  07/19/20 10:25 PM   Specimen: BLOOD  Result Value Ref Range Status   Specimen Description BLOOD SITE NOT SPECIFIED  Final   Special Requests   Final    BOTTLES DRAWN AEROBIC ONLY Blood Culture adequate volume   Culture   Final    NO GROWTH 5 DAYS Performed at [facility], [address], [place], [place] [address]    Report Status 07/24/2020 FINAL  Final  Culture, blood (Routine X 2) w Reflex to ID Panel     Status: Abnormal   Collection Time: 07/20/20  6:29 AM   Specimen: BLOOD LEFT HAND  Result Value Ref Range Status   Specimen Description BLOOD LEFT HAND  Final   Special Requests   Final    BOTTLES DRAWN AEROBIC AND ANAEROBIC Blood Culture results may not be optimal due to an inadequate volume of blood received in culture bottles   Culture  Setup Time   Final    GRAM POSITIVE COCCI ANAEROBIC BOTTLE ONLY CRITICAL VALUE NOTED.  VALUE IS CONSISTENT WITH PREVIOUSLY REPORTED AND CALLED VALUE.    Culture (A)  Final    STAPHYLOCOCCUS AURICULARIS THE SIGNIFICANCE OF ISOLATING THIS ORGANISM FROM A SINGLE SET OF BLOOD CULTURES WHEN MULTIPLE SETS ARE DRAWN IS UNCERTAIN. PLEASE NOTIFY THE MICROBIOLOGY DEPARTMENT WITHIN ONE WEEK IF SPECIATION AND SENSITIVITIES ARE REQUIRED. Performed at [facility], [place] [address], [place], [place] [address]    Report Status 07/23/2020 FINAL  Final  Culture, Respiratory w Gram Stain     Status: None (Preliminary result)   Collection Time: 07/23/20 10:41 AM   Specimen: Tracheal Aspirate; Respiratory  Result Value Ref Range Status   Specimen Description TRACHEAL ASPIRATE  Final   Special Requests NONE  Final   Gram Stain   Final    ABUNDANT WBC PRESENT,BOTH PMN AND MONONUCLEAR ABUNDANT GRAM POSITIVE RODS FEW GRAM POSITIVE COCCI    Culture   Final    MODERATE STAPHYLOCOCCUS AUREUS SUSCEPTIBILITIES TO FOLLOW Performed at [facility], [place] [address] [place], [place] [address]    Report Status PENDING  Incomplete    Radiology  Studies: No results found.  Scheduled Meds:  aspirin EC  81 mg Oral Daily   atorvastatin  40 mg Oral Daily   Chlorhexidine Gluconate Cloth  6 each Topical Q1200   feeding supplement (NEPRO  CARB STEADY)  237 mL Oral TID BM   heparin injection (subcutaneous)  5,000 Units Subcutaneous Q8H   insulin aspart  0-15 Units Subcutaneous TID WC   insulin glargine  10 Units Subcutaneous Daily   lanthanum  500 mg Oral TID WC   multivitamin  1 tablet Oral QHS   sodium chloride flush  10-40 mL Intracatheter Q12H   Continuous Infusions:  sodium chloride     sodium chloride 10 mL/hr at 07/24/20 1900   norepinephrine (LEVOPHED) Adult infusion Stopped (07/24/20 1529)   piperacillin-tazobactam (ZOSYN)  IV 2.25 g (07/26/20 0205)   sodium chloride     vancomycin      LOS: 13 days   Time spent: 29mn  Electa Sterry C Serra Younan, DO [facility]  If 7PM-7AM, please contact night-coverage [email or web address]  07/26/2020, 7:24 AM

## 2020-07-26 NOTE — Progress Notes (Signed)
Pt arrived to the unit.

## 2020-07-26 NOTE — Progress Notes (Signed)
  Speech Language Pathology Treatment: Dysphagia  Patient Details Name: Jorge [name] MRN: [id number] DOB: Dec 16, 1970 Today's Date: 07/26/2020 Time: 1020-1040 SLP Time Calculation (min) (ACUTE ONLY): 20 min  Assessment / Plan / Recommendation Clinical Impression  Pt seen for upgraded PO trials and diet analysis. Per RN pt tolerating diet well and has done excellent with cup sips of water. Assessed pt with thin liquids and regular POs. No overt s/sx of aspiration exhibited. Pt vocal quality noted to subjectively strong, no hoarseness appreciated as noted in prior SLP interactions. Recommend diet advancement to regular and thin liquids. No ST follow up indicated for swallowing.   HPI HPI: Pt is a 50 y.o. male admitted 07/13/2020 with AMS and volume overload and was intubated 7/7. MRI brain 7/7: 4 small acute infarctions, left cerebellar white matter, right posteroinferior parietal white matter and 2 in the left frontal white matter. On 7/11, pt was noted to be progressively encephalopathic with weakness, left gaze preference and decreased consciousness (GCS 8). MRI brain 7/12: New acute infarct left centrum semiovale. Other evolving small areas of infarction on the prior study. Cortrak placed 7/8. ETT 7/7- 7/9 (self-extubated); 7/11-7/17 (1125). CXR on admission 7/16: No definite acute cardiopulmonary process. CXR on date of eval 7/17: Improved RIGHT lower lobe atelectasis/infiltrate. PMH: HTN, T2DM, ESRD (on HD MWF), bilateral BKA, ICH, TIA.      SLP Plan  All goals met       Recommendations  Diet recommendations: Regular;Thin liquid Liquids provided via: Cup;Straw Medication Administration: Whole meds with liquid Supervision: Patient able to self feed;Intermittent supervision to cue for compensatory strategies Compensations: Slow rate;Small sips/bites;Clear throat intermittently Postural Changes and/or Swallow Maneuvers: Seated upright 90 degrees                Oral Care  Recommendations: Oral care BID Follow up Recommendations: None SLP Visit Diagnosis: Dysphagia, unspecified (R13.10) Plan: All goals met       GO                Hayden Rasmussen MA, CCC-SLP Acute Rehabilitation Services   07/26/2020, 11:18 AM

## 2020-07-26 NOTE — Progress Notes (Signed)
Patient ID: Jorge [name], male   DOB: 1970/04/14, 50 y.o.   MRN: [id number] S: No new complaints. O:BP 97/74   Pulse 82   Temp 98.9 F (37.2 C) (Oral)   Resp 17   Ht '5\' 11"'$  (1.803 m)   Wt 66.2 kg   SpO2 91%   BMI 20.36 kg/m   Intake/Output Summary (Last 24 hours) at 07/26/2020 1101 Last data filed at 07/25/2020 1742 Gross per 24 hour  Intake 45 ml  Output 0 ml  Net 45 ml   Intake/Output: I/O last 3 completed shifts: In: 270 [NG/GT:270] Out: 0   Intake/Output this shift:  No intake/output data recorded. Weight change:  Gen: NAD CVS: RRR Resp: CTA Abd: benign Ext: no edema, RUE AVF +T/ B  Recent Labs  Lab 07/20/20 0341 07/21/20 0437 07/22/20 0400 07/23/20 0520 07/24/20 0236 07/25/20 0620 07/26/20 0419  NA 140 140 135 136 132* 137 139  K 4.0 3.7 5.2* 3.8 3.5 3.4* 3.4*  CL 98 101 97* 98 96* 99 98  CO2 '25 27 23 27 22 '$ 20* 26  GLUCOSE 166* 100* 170* 148* 147* 147* 148*  BUN 55* 41* 67* 43* 61* 86* 34*  CREATININE 7.06* 5.33* 7.38* 5.54* 6.88* 9.23* 5.52*  ALBUMIN  --   --   --   --  2.6*  --   --   CALCIUM 10.4* 9.7 9.5 9.7 9.3 9.4 9.0  PHOS  --   --   --   --  5.1* 5.8*  --   AST  --   --   --   --  103*  --   --   ALT  --   --   --   --  106*  --   --    Liver Function Tests: Recent Labs  Lab 07/24/20 0236  AST 103*  ALT 106*  ALKPHOS 144*  BILITOT 0.4  PROT 7.5  ALBUMIN 2.6*   No results for input(s): LIPASE, AMYLASE in the last 168 hours. No results for input(s): AMMONIA in the last 168 hours. CBC: Recent Labs  Lab 07/22/20 0400 07/23/20 0520 07/24/20 0236 07/25/20 0620 07/26/20 0419  WBC 16.8* 19.2* 18.6* 15.4* 10.5  NEUTROABS  --  13.2*  --   --   --   HGB 11.2* 11.4* 10.9* 10.0* 10.5*  HCT 34.3* 35.8* 34.9* 30.6* 32.6*  MCV 95.3 96.0 98.0 95.0 95.3  PLT 180 179 161 173 185   Cardiac Enzymes: No results for input(s): CKTOTAL, CKMB, CKMBINDEX, TROPONINI in the last 168 hours. CBG: Recent Labs  Lab 07/25/20 0729 07/25/20 1115  07/25/20 1825 07/25/20 2114 07/26/20 0726  GLUCAP 171* 173* 98 184* 145*    Iron Studies: No results for input(s): IRON, TIBC, TRANSFERRIN, FERRITIN in the last 72 hours. Studies/Results: No results found.  aspirin EC  81 mg Oral Daily   atorvastatin  40 mg Oral Daily   Chlorhexidine Gluconate Cloth  6 each Topical Q1200   feeding supplement (NEPRO CARB STEADY)  237 mL Oral TID BM   heparin injection (subcutaneous)  5,000 Units Subcutaneous Q8H   insulin aspart  0-15 Units Subcutaneous TID WC   insulin glargine  10 Units Subcutaneous Daily   lanthanum  500 mg Oral TID WC   multivitamin  1 tablet Oral QHS   sodium chloride flush  10-40 mL Intracatheter Q12H    BMET    Component Value Date/Time   NA 139 07/26/2020 0419   NA 136 (A)  07/10/2017 0000   K 3.4 (L) 07/26/2020 0419   CL 98 07/26/2020 0419   CO2 26 07/26/2020 0419   GLUCOSE 148 (H) 07/26/2020 0419   BUN 34 (H) 07/26/2020 0419   BUN 51 (A) 07/10/2017 0000   CREATININE 5.52 (H) 07/26/2020 0419   CALCIUM 9.0 07/26/2020 0419   GFRNONAA 12 (L) 07/26/2020 0419   GFRAA 8 (L) 10/10/2019 0730   CBC    Component Value Date/Time   WBC 10.5 07/26/2020 0419   RBC 3.42 (L) 07/26/2020 0419   HGB 10.5 (L) 07/26/2020 0419   HCT 32.6 (L) 07/26/2020 0419   PLT 185 07/26/2020 0419   MCV 95.3 07/26/2020 0419   MCH 30.7 07/26/2020 0419   MCHC 32.2 07/26/2020 0419   RDW 15.9 (H) 07/26/2020 0419   LYMPHSABS 2.5 07/23/2020 0520   MONOABS 2.0 (H) 07/23/2020 0520   EOSABS 1.2 (H) 07/23/2020 0520   BASOSABS 0.1 07/23/2020 0520    Dialysis: MWF [facility]  4h   68kg  2/2.5 bath  AVF  P2  Hep none - Hecoral 47mg Iv q HD   Assessment/Plan:   ESRD - plan for HD on his MWF schedule without much UF to help prevent any drops in BP. AMS - multiple bilateral CVA's. Neuro following. Staph hominis bacteremia/aspiration PNA - completed abx.  Possible meningitis/encephalitis.   Acute hypoxic respiratory failure - due to RLL PNA, improved and  off vent. HTN/Volume - stable Anemia of ESRD - stable CKD-BMD - restarted phosphate binders.  Holding vit D due to ^ calcium. Disposition - will likely require SNF or CIR  [name] MD [facility]([phone]

## 2020-07-26 NOTE — Evaluation (Signed)
Occupational Therapy Evaluation Patient Details Name: Jorge [name] MRN: [id number] DOB: 30-Mar-1970 Today's Date: 07/26/2020    History of Present Illness Jorge [name] is a 50 y.o. male admitted 07/13/20 with confusion during dialysis (incomplete session secondary to him pulling at lines), found to be volume overloaded, with labile blood pressures (initially normotensive, but hypertensive into the 200s with worsening altered mental status).  Due to continued agitation he was intubated to facilitate work-up for his altered mental status. ETT 7/7- 7/9 (self-extubated); 7/11-7/17. Cortrak 7/8.   MRI brain 7/7: 4 small acute infarctions, left cerebellar white matter, right posteroinferior parietal white matter and 2 in the left frontal white matter. On 7/11, pt was noted to be progressively encephalopathic with weakness, left gaze preference and decreased consciousness (GCS 8).  MRI brain 7/12: New acute infarct left centrum semiovale. Other evolving small areas of infarction on the prior study.  PMH: HTN, hyperlipidemia, diabetes, end-stage renal disease on peritoneal dialysis, right basal ganglia ICH with IVH and subarachnoid hemorrhage (hypertensive bleed, 10/03/2019), bilateral BKA   Clinical Impression   Patient is a questionable historian with PLOF obtained from med chart. Patient reporting inaccuracies today compared to information provided yesterday at time of PT eval. Patient reports living with his sister in a private residence stating he was grossly I with ADLs/IADLs with and without use of AD. At time of PT eval patient reports use of RW in community dwellings and at time of OT eval this date patient reports use of wc in community dwellings. Patient currently functioning below baseline requiring Min A for bed mobility and functional transfers with bilateral prosthetics and use of RW. Patient also limited by deficits listed below including decreased cognition, possible visual deficits to be further  tested at time of next OT session, and mild R inattention and would benefit from continued acute OT services in prep for safe d/c to next level of care with recommendation for SNF rehab.     Follow Up Recommendations  SNF    Equipment Recommendations  Other (comment) (Defer to next level of care.)    Recommendations for Other Services       Precautions / Restrictions Precautions Precautions: Fall Precaution Comments: Bil LE prostheses, Cortrak Restrictions Weight Bearing Restrictions: No      Mobility Bed Mobility Overal bed mobility: Needs Assistance Bed Mobility: Rolling;Sidelying to Sit Rolling: Supervision Sidelying to sit: Min assist       General bed mobility comments: Supervision A for rolling to L and Min A with HOB slightly elevated to bring trunk upright.    Transfers Overall transfer level: Needs assistance Equipment used: Rolling walker (2 wheeled) Transfers: Sit to/from [facility] Sit to Stand: Min assist Stand pivot transfers: Min assist       General transfer comment: Min A for sit to stand from EOB with Min A with cues for hand placement/safety. Min A for stand-pivot to recliner with cues for walker management/safety. Patient with very poor safety awarenss.    Balance Overall balance assessment: Needs assistance Sitting-balance support: No upper extremity supported;Feet supported;Bilateral upper extremity supported Sitting balance-Leahy Scale: Fair Sitting balance - Comments: Min guard to maintain static sitting balance at EOB with BUE supported on bed surface.   Standing balance support: Bilateral upper extremity supported;During functional activity Standing balance-Leahy Scale: Poor Standing balance comment: Reliant on BUE on RW and Min external assist to maintain dynamic balance.  ADL either performed or assessed with clinical judgement   ADL Overall ADL's : Needs assistance/impaired                      Lower Body Dressing: Minimal assistance;Sit to/from stand Lower Body Dressing Details (indicate cue type and reason): Able to don bilateral prosthetics seated EOB with increased time and intermittent assist to maintain static sitting balance. Toilet Transfer: Minimal Insurance claims handler Details (indicate cue type and reason): Simulated with transfer to recliner with use of RW.         Functional mobility during ADLs: Minimal assistance;Cueing for safety;Rolling walker General ADL Comments: Patient greatly limited by decreased cognition, generalized weakness, and mild R inattention.     Vision Baseline Vision/History: No visual deficits Patient Visual Report: Blurring of vision Vision Assessment?: Vision impaired- to be further tested in functional context     Perception     Praxis      Pertinent Vitals/Pain Pain Assessment: No/denies pain     Hand Dominance Left   Extremity/Trunk Assessment         Cervical / Trunk Assessment Cervical / Trunk Assessment: Normal   Communication Communication Communication: No difficulties   Cognition Arousal/Alertness: Awake/alert Behavior During Therapy: Flat affect Overall Cognitive Status: Impaired/Different from baseline Area of Impairment: Orientation;Memory;Following commands;Safety/judgement;Awareness;Problem solving                 Orientation Level: Disoriented to;Place;Time;Situation   Memory: Decreased recall of precautions;Decreased short-term memory Following Commands: Follows one step commands inconsistently;Follows one step commands with increased time Safety/Judgement: Decreased awareness of safety;Decreased awareness of deficits   Problem Solving: Slow processing;Difficulty sequencing;Decreased initiation;Requires verbal cues;Requires tactile cues General Comments: Patient with continued confusion although A&Ox4; poor insight, poor safety awareness, and poor knowledge of current  deficits noted; follows 1-2 step verbal commands with increased time.   General Comments       Exercises     Shoulder Instructions      Home Living   Living Arrangements: Other relatives;Children (stays with sister who works, children work) Available Help at Discharge: Available PRN/intermittently;Family Type of Home: House Home Access: Stairs to enter [employer] of Steps: 1 Entrance Stairs-Rails: None Home Layout: Two level;Able to live on main level with bedroom/bathroom     Bathroom Shower/Tub: Teacher, early years/pre: Standard     Home Equipment: Environmental consultant - 2 wheels;Shower seat   Additional Comments: Bil Prosthetics      Prior Functioning/Environment Level of Independence: Independent with assistive device(s)        Comments: Patient is a questionable historian reporting ambulating limited community distances with use of prosthetics and RW at time of PT eval. Patient reports use of wheelchair for community mobility at time of OT eval.        OT Problem List: Decreased strength;Decreased range of motion;Impaired balance (sitting and/or standing);Impaired vision/perception;Decreased coordination;Decreased safety awareness;Decreased knowledge of use of DME or AE      OT Treatment/Interventions: Self-care/ADL training;Neuromuscular education;Therapeutic exercise;Energy conservation;DME and/or AE instruction;Therapeutic activities;Cognitive remediation/compensation;Visual/perceptual remediation/compensation;Patient/family education;Balance training    OT Goals(Current goals can be found in the care plan section) Acute Rehab OT Goals Patient Stated Goal: to go home OT Goal Formulation: With patient Time For Goal Achievement: 08/09/20 Potential to Achieve Goals: Good ADL Goals Pt Will Perform Eating: Independently Pt Will Perform Grooming: standing;with supervision Pt Will Perform Upper Body Dressing: with set-up;sitting Pt Will Perform Lower  Body Dressing: sit to/from stand;with supervision Pt Will  Transfer to Toilet: ambulating;with supervision Pt Will Perform Toileting - Clothing Manipulation and hygiene: with supervision;sit to/from stand Additional ADL Goal #1: Patient will score <4/28 on SBT indicating increased cognition in prep for ADLs/IADLs.  OT Frequency: Min 2X/week   Barriers to D/C: Decreased caregiver support          Co-evaluation              AM-PAC OT "6 Clicks" Daily Activity     Outcome Measure Help from another person eating meals?: A Little Help from another person taking care of personal grooming?: A Little Help from another person toileting, which includes using toliet, bedpan, or urinal?: A Lot Help from another person bathing (including washing, rinsing, drying)?: A Little Help from another person to put on and taking off regular upper body clothing?: A Little Help from another person to put on and taking off regular lower body clothing?: A Lot 6 Click Score: 16   End of Session Equipment Utilized During Treatment: Gait belt;Rolling walker Nurse Communication: Mobility status;Other (comment) (Response to treatment)  Activity Tolerance: Patient tolerated treatment well Patient left: in chair;with call bell/phone within reach;with chair alarm set  OT Visit Diagnosis: Unsteadiness on feet (R26.81);Other abnormalities of gait and mobility (R26.89);Muscle weakness (generalized) (M62.81)                Time: [id number] OT Time Calculation (min): 19 min Charges:  OT General Charges $OT Visit: 1 Visit OT Evaluation $OT Eval Moderate Complexity: 1 Mod  Shammara Jarrett H. OTR/L Supplemental OT, Department of rehab services [phone]  Mylin Gignac R H. 07/26/2020, 10:56 AM

## 2020-07-26 NOTE — Progress Notes (Signed)
Brief Nutrition Note  RN reached out to RD regarding pt consistently having type 7 bowel movements on Nepro supplement. Pt reported to RD yesterday that he is lactose intolerant; however, Nepro supplement is suitable for lactose intolerance. RD will discontinue Nepro supplement as pt's RN reports that he is eating well (~75% meal completions per RN). Will trial ProSource Plus po to aid pt in meeting increased protein needs. Will also order double protein portions TID with meals.  Discussed pt with MD. Will also order NutriSource fiber BID to aid in bulking pt's stool.   Gustavus Bryant, MS, RD, LDN Inpatient Clinical Dietitian Please see AMiON for contact information.

## 2020-07-27 DIAGNOSIS — D638 Anemia in other chronic diseases classified elsewhere: Secondary | ICD-10-CM | POA: Diagnosis not present

## 2020-07-27 DIAGNOSIS — N186 End stage renal disease: Secondary | ICD-10-CM | POA: Diagnosis not present

## 2020-07-27 DIAGNOSIS — E1159 Type 2 diabetes mellitus with other circulatory complications: Secondary | ICD-10-CM | POA: Diagnosis not present

## 2020-07-27 DIAGNOSIS — E1169 Type 2 diabetes mellitus with other specified complication: Secondary | ICD-10-CM | POA: Diagnosis not present

## 2020-07-27 LAB — CBC
HCT: 34.6 % — ABNORMAL LOW (ref 39.0–52.0)
Hemoglobin: 10.7 g/dL — ABNORMAL LOW (ref 13.0–17.0)
MCH: 30.1 pg (ref 26.0–34.0)
MCHC: 30.9 g/dL (ref 30.0–36.0)
MCV: 97.5 fL (ref 80.0–100.0)
Platelets: 195 10*3/uL (ref 150–400)
RBC: 3.55 MIL/uL — ABNORMAL LOW (ref 4.22–5.81)
RDW: 15.8 % — ABNORMAL HIGH (ref 11.5–15.5)
WBC: 8.9 10*3/uL (ref 4.0–10.5)
nRBC: 0 % (ref 0.0–0.2)

## 2020-07-27 LAB — BASIC METABOLIC PANEL
Anion gap: 16 — ABNORMAL HIGH (ref 5–15)
BUN: 45 mg/dL — ABNORMAL HIGH (ref 6–20)
CO2: 22 mmol/L (ref 22–32)
Calcium: 9.4 mg/dL (ref 8.9–10.3)
Chloride: 100 mmol/L (ref 98–111)
Creatinine, Ser: 7.54 mg/dL — ABNORMAL HIGH (ref 0.61–1.24)
GFR, Estimated: 8 mL/min — ABNORMAL LOW (ref >60)
Glucose, Bld: 145 mg/dL — ABNORMAL HIGH (ref 70–99)
Potassium: 3.7 mmol/L (ref 3.5–5.1)
Sodium: 138 mmol/L (ref 135–145)

## 2020-07-27 LAB — GLUCOSE, CAPILLARY
Glucose-Capillary: 143 mg/dL — ABNORMAL HIGH (ref 70–99)
Glucose-Capillary: 96 mg/dL (ref 70–99)
Glucose-Capillary: 82 mg/dL (ref 70–99)

## 2020-07-27 MED ORDER — VANCOMYCIN HCL 750 MG/150ML IV SOLN
INTRAVENOUS | Status: AC
  Administered 2020-07-27: 750 mg
  Filled 2020-07-27: qty 150

## 2020-07-27 MED ORDER — HEPARIN SODIUM (PORCINE) 1000 UNIT/ML DIALYSIS: 20.0000 [IU]/kg | INTRAMUSCULAR | Status: DC | PRN

## 2020-07-27 NOTE — TOC Progression Note (Signed)
Transition of Care [facility]) - Progression Note    Patient Details  Name: Jorge [name] MRN: [id number] Date of Birth: [birth year]  [facility]) CM/SW [place], [place] Phone Number: 07/27/2020, 2:45 PM  Clinical Narrative:     Met with pt to discuss SNF. He has bed sheet over his face but is responsive to CSW. CSW asks if he would take sheet off his face which he does and appears lethargic with flat affect. Pt informs CSW that he came to hospital from [place]. States he recieves HD at [place] location off of [address]. He states he is fully vaccinated. He is fine with returning to [place].   CSW contacted Helene Kelp, [facility] liaison. She informs CSW that pt is LTC there. She states she visited pt yesterday and he is not at his baseline mental status. She explains typically he is oriented x4 and does not exhibit any confusion. He is able to participate regularly in conversations without problems. She states, if indicated by PT/OT, that [facility] auth be started for rehab prior to DC. If not indicated, pt can return under his medicaid.   Expected Discharge Plan: [place] Barriers to Discharge: Continued Medical Work up  Expected Discharge Plan and Services Expected Discharge Plan: [place] arrangements for the past 2 months: Single Family Home                                       Social Determinants of Health (SDOH) Interventions    Readmission Risk Interventions No flowsheet data found.

## 2020-07-27 NOTE — Progress Notes (Signed)
PROGRESS NOTE    Jorge [name]  [id number] DOB: [birth year] DOA: 07/13/2020 PCP: Lauree Chandler, NP   Brief Narrative:  Jorge [name] is a 50 y.o. male with PMHx significant for HTN, T2DM, ESRD (on HD MWF), bilateral BKA, ICH, TIA admitted 07/13/2020 with AMS and volume overload, patient presented from dialysis after 1 hour he became confused pulling at lines somewhat combative noted to be hypertensive and hypoxic with worsening mental status in the ED upon presentation on 07/13/2020.  Initially admitted to the floor with worsening mental status deterioration concern for airway protection and was intubated on 07/14/2020.  Patient self extubated on 07/16/2020, stabilizing over the past week.  Patient noted to have gaze preference with a GCS of 8 and unilateral weakness code stroke was called -CT head showed small subacute infarcts previously documented -PCCM reconsulted on the 11th to reintubate the patient given worsening clinical picture and airway compromise.  TEE on the 14th showed a small PFO, concern for HCAP versus aspiration pneumonia in the interim vancomycin and Zosyn initiated 07/23/2020-de-escalate to vancomycin only given sputum cultures positive for MRSA as of 07/26/2020.  Subjective No acute issues or events overnight, patient's mental status continues to be somewhat labile but improving, denies nausea vomiting diarrhea constipation headache fevers or chills.  Assessment & Plan:   Principal Problem:   AMS (altered mental status) Active Problems:   ESRD (end stage renal disease) on dialysis ([place])   Hypertension associated with diabetes ([place])   Type 2 diabetes mellitus with chronic kidney disease on chronic dialysis, with long-term current use of insulin ([name])   Anemia of chronic disease   Acute encephalopathy   Hyperlipidemia associated with type 2 diabetes mellitus ([name])   Hypertensive urgency   Malnutrition of moderate degree   Acute metabolic encephalopathy, multifactorial,  improving Multiple bilateral CVA, acute, likely contributing  -Continue aspirin, statin -MRI shows other evolving small areas of infarction on the prior study - concerning for recrudescence of old strokes on top of apparent new stroke likely secondary to hypertensive emergency as below as well as profound hypoxia    Acute hypoxemic respiratory failure secondary to encephalopathy Suspected right lower lobe HCAP versus aspiration -Successfully extubated on 07/24/2020 currently on room air  -Continues on vancomycin only given MRSA positive sputum cultures -Concern for aspiration in house given requirement for reintubation and mental status changes as outlined above in the setting of acute stroke   Sepsis, multifactorial, POA Septic shock Right lower lobe suspected aspiration pneumonia, POA Recurrent aspiration event with worsening right lower lobe infiltrate Rule out bacteremia - Culture 7/6 staph hominis, then repeat culture with staph auricularis in 1/4. Unclear significance, central line removed 7/15 and TEE reassuring. 7/16 respiratory culture growing staph aureus - Empiric vancomycin, Zosyn added on 7/16, narrowed to vancomycin only 7/19 - Off pressors since 07/24/2020   History of hypertension -Holding antihypertensives in the setting of shock -Continue to follow  ESRD on iHD -Appreciate nephrology management, intermittent HD MWF, now at dry weight -Following BMP, I/O   T2DM with hyperglycemia -Lantus as ordered -Sliding scale insulin protocol -Dysphagia 3 diet, NG tube removed   DVT prophylaxis: Heparin Code Status: Full Family Communication: None present -sister unavailable by phone, voicemail left  Status is: Inpatient  [name]: The patient is from: Home              Anticipated d/c is to: To be determined              Anticipated  d/c date is: > 72 hours              Patient currently not medically stable for discharge  Consultants:  PCCM, nephrology  Antimicrobials:   Vancomycin ongoing  Objective: Vitals:   07/26/20 1659 07/26/20 2113 07/26/20 2114 07/27/20 0630  BP: (!) 132/43 (!) 125/40  (!) 103/49  Pulse: 76 79 76 71  Resp: 20   18  Temp: 98.2 F (36.8 C) 98.3 F (36.8 C)  98.5 F (36.9 C)  TempSrc: Oral Oral  Oral  SpO2: 91% (!) 81% 95% 97%  Weight:      Height:        Intake/Output Summary (Last 24 hours) at 07/27/2020 0823 Last data filed at 07/26/2020 1726 Gross per 24 hour  Intake 120 ml  Output --  Net 120 ml    Filed Weights   07/24/20 0332 07/25/20 1357 07/26/20 0554  Weight: 68.1 kg 65.4 kg 66.2 kg    Examination:  General:  Pleasantly resting in bed, No acute distress. HEENT:  Normocephalic atraumatic.  Sclerae nonicteric, noninjected.  Extraocular movements intact bilaterally.  Neck:  Without mass or deformity.  Trachea is midline. Lungs:  Clear to auscultate bilaterally without rhonchi, wheeze, or rales. Heart:  Regular rate and rhythm.  Without murmurs, rubs, or gallops. Abdomen:  Soft, nontender, nondistended.  Without guarding or rebound. Extremities: Without cyanosis, clubbing, edema, or obvious deformity.  Fecal tube intact draining watery stool.  Right upper extremity dialysis fistula noted Vascular:  Dorsalis pedis and posterior tibial pulses palpable bilaterally. Skin:  Warm and dry, no erythema, no ulcerations.  Data Reviewed: I have personally reviewed following labs and imaging studies  CBC: Recent Labs  Lab 07/23/20 0520 07/24/20 0236 07/25/20 0620 07/26/20 0419 07/27/20 0429  WBC 19.2* 18.6* 15.4* 10.5 8.9  NEUTROABS 13.2*  --   --   --   --   HGB 11.4* 10.9* 10.0* 10.5* 10.7*  HCT 35.8* 34.9* 30.6* 32.6* 34.6*  MCV 96.0 98.0 95.0 95.3 97.5  PLT 179 161 173 185 0000000    Basic Metabolic Panel: Recent Labs  Lab 07/23/20 0520 07/24/20 0236 07/25/20 0620 07/26/20 0419 07/27/20 0429  NA 136 132* 137 139 138  K 3.8 3.5 3.4* 3.4* 3.7  CL 98 96* 99 98 100  CO2 27 22 20* 26 22  GLUCOSE 148*  147* 147* 148* 145*  BUN 43* 61* 86* 34* 45*  CREATININE 5.54* 6.88* 9.23* 5.52* 7.54*  CALCIUM 9.7 9.3 9.4 9.0 9.4  MG 2.2 2.3 2.6*  --   --   PHOS  --  5.1* 5.8*  --   --     GFR: Estimated Creatinine Clearance: 10.7 mL/min (A) (by C-G formula based on SCr of 7.54 mg/dL (H)). Liver Function Tests: Recent Labs  Lab 07/24/20 0236  AST 103*  ALT 106*  ALKPHOS 144*  BILITOT 0.4  PROT 7.5  ALBUMIN 2.6*    No results for input(s): LIPASE, AMYLASE in the last 168 hours. No results for input(s): AMMONIA in the last 168 hours. Coagulation Profile: No results for input(s): INR, PROTIME in the last 168 hours. Cardiac Enzymes: No results for input(s): CKTOTAL, CKMB, CKMBINDEX, TROPONINI in the last 168 hours. BNP (last 3 results) No results for input(s): PROBNP in the last 8760 hours. HbA1C: No results for input(s): HGBA1C in the last 72 hours. CBG: Recent Labs  Lab 07/26/20 0726 07/26/20 1142 07/26/20 1655 07/26/20 2111 07/27/20 0627  GLUCAP 145* 250* 120* 197*  143*    Lipid Profile: No results for input(s): CHOL, HDL, LDLCALC, TRIG, CHOLHDL, LDLDIRECT in the last 72 hours. Thyroid Function Tests: No results for input(s): TSH, T4TOTAL, FREET4, T3FREE, THYROIDAB in the last 72 hours. Anemia Panel: No results for input(s): VITAMINB12, FOLATE, FERRITIN, TIBC, IRON, RETICCTPCT in the last 72 hours. Sepsis Labs: Recent Labs  Lab 07/24/20 0236  LATICACIDVEN 1.6     Recent Results (from the past 240 hour(s))  Culture, blood (routine x 2)     Status: None   Collection Time: 07/19/20 10:25 PM   Specimen: BLOOD  Result Value Ref Range Status   Specimen Description BLOOD SITE NOT SPECIFIED  Final   Special Requests   Final    BOTTLES DRAWN AEROBIC ONLY Blood Culture adequate volume   Culture   Final    NO GROWTH 5 DAYS Performed at [facility], [address], [place], [place] [address]    Report Status 07/24/2020 FINAL  Final  Culture, blood (Routine X 2) w  Reflex to ID Panel     Status: Abnormal   Collection Time: 07/20/20  6:29 AM   Specimen: BLOOD LEFT HAND  Result Value Ref Range Status   Specimen Description BLOOD LEFT HAND  Final   Special Requests   Final    BOTTLES DRAWN AEROBIC AND ANAEROBIC Blood Culture results may not be optimal due to an inadequate volume of blood received in culture bottles   Culture  Setup Time   Final    GRAM POSITIVE COCCI ANAEROBIC BOTTLE ONLY CRITICAL VALUE NOTED.  VALUE IS CONSISTENT WITH PREVIOUSLY REPORTED AND CALLED VALUE.    Culture (A)  Final    STAPHYLOCOCCUS AURICULARIS THE SIGNIFICANCE OF ISOLATING THIS ORGANISM FROM A SINGLE SET OF BLOOD CULTURES WHEN MULTIPLE SETS ARE DRAWN IS UNCERTAIN. PLEASE NOTIFY THE MICROBIOLOGY DEPARTMENT WITHIN ONE WEEK IF SPECIATION AND SENSITIVITIES ARE REQUIRED. Performed at [facility], [place] [address], [place], [place] [address]    Report Status 07/23/2020 FINAL  Final  Culture, Respiratory w Gram Stain     Status: None   Collection Time: 07/23/20 10:41 AM   Specimen: Tracheal Aspirate; Respiratory  Result Value Ref Range Status   Specimen Description TRACHEAL ASPIRATE  Final   Special Requests NONE  Final   Gram Stain   Final    ABUNDANT WBC PRESENT,BOTH PMN AND MONONUCLEAR ABUNDANT GRAM POSITIVE RODS FEW GRAM POSITIVE COCCI    Culture   Final    MODERATE METHICILLIN RESISTANT STAPHYLOCOCCUS AUREUS No Pseudomonas species isolated ABUNDANT DIPHTHEROIDS(CORYNEBACTERIUM SPECIES) Standardized susceptibility testing for this organism is not available. Performed at [facility], [place] [address] [place], [place] [address]    Report Status 07/26/2020 FINAL  Final   Organism ID, Bacteria METHICILLIN RESISTANT STAPHYLOCOCCUS AUREUS  Final      Susceptibility   Methicillin resistant staphylococcus aureus - MIC*    CIPROFLOXACIN >=8 RESISTANT Resistant     ERYTHROMYCIN >=8 RESISTANT Resistant     GENTAMICIN <=0.5 SENSITIVE Sensitive     OXACILLIN >=4  RESISTANT Resistant     TETRACYCLINE <=1 SENSITIVE Sensitive     VANCOMYCIN 1 SENSITIVE Sensitive     TRIMETH/SULFA 80 RESISTANT Resistant     CLINDAMYCIN >=8 RESISTANT Resistant     RIFAMPIN <=0.5 SENSITIVE Sensitive     Inducible Clindamycin NEGATIVE Sensitive     * MODERATE METHICILLIN RESISTANT STAPHYLOCOCCUS AUREUS     Radiology Studies: No results found.  Scheduled Meds:  (feeding  supplement) PROSource Plus  30 mL Oral BID BM   aspirin EC  81 mg Oral Daily   atorvastatin  40 mg Oral Daily   Chlorhexidine Gluconate Cloth  6 each Topical Q1200   fiber  1 packet Oral BID   heparin injection (subcutaneous)  5,000 Units Subcutaneous Q8H   insulin aspart  0-15 Units Subcutaneous TID WC   insulin glargine  10 Units Subcutaneous Daily   lanthanum  500 mg Oral TID WC   multivitamin  1 tablet Oral QHS   sodium chloride flush  10-40 mL Intracatheter Q12H   Continuous Infusions:  sodium chloride     sodium chloride 10 mL/hr at 07/24/20 1900   sodium chloride     vancomycin      LOS: 14 days   Time spent: 29mn  Chanell Nadeau C Knute Mazzuca, DO [facility]  If 7PM-7AM, please contact night-coverage [email or web address]  07/27/2020, 8:23 AM

## 2020-07-27 NOTE — Progress Notes (Signed)
PT Cancellation Note  Patient Details Name: Jorge [name] MRN: [id number] DOB: 02/18/1970   Cancelled Treatment:    Reason Eval/Treat Not Completed: (P) Patient at procedure or test/unavailable Pt is off floor for HD. PT will follow back this afternoon for treatment as able.  Masiah Lewing B. Migdalia Dk PT, DPT Acute Rehabilitation Services Pager [phone] Office [phone]    [place] 07/27/2020, 8:42 AM

## 2020-07-27 NOTE — Progress Notes (Signed)
Pt off unit to hemodialysis. 

## 2020-07-27 NOTE — Consult Note (Signed)
   [facility] CM Inpatient Consult   07/27/2020  Jorge [name] August 13, 1970 [id number]   [facility] [[facility]] Patient: [facility]  Primary Care Provider:  Lauree Chandler, NP  Skilled nursing facility recommended.. Review of inpatient TOC LCSW is the patient is long term care resident at [facility].   For questions or referrals, please contact:   Natividad Brood, RN BSN [facility] Liaison  [phone] business mobile phone Toll free office [phone]  Fax number: [phone] [name]

## 2020-07-27 NOTE — Procedures (Signed)
I was present at this dialysis session. I have reviewed the session itself and made appropriate changes.   Vital signs in last 24 hours:  Temp:  [98.1 F (36.7 C)-98.8 F (37.1 C)] 98.5 F (36.9 C) (07/20 0630) Pulse Rate:  [71-90] 71 (07/20 0630) Resp:  [12-29] 18 (07/20 0630) BP: (100-132)/(40-90) 103/49 (07/20 0630) SpO2:  [81 %-100 %] 97 % (07/20 0630) Weight change:  Filed Weights   07/24/20 0332 07/25/20 1357 07/26/20 0554  Weight: 68.1 kg 65.4 kg 66.2 kg    Recent Labs  Lab 07/25/20 0620 07/26/20 0419 07/27/20 0429  NA 137   < > 138  K 3.4*   < > 3.7  CL 99   < > 100  CO2 20*   < > 22  GLUCOSE 147*   < > 145*  BUN 86*   < > 45*  CREATININE 9.23*   < > 7.54*  CALCIUM 9.4   < > 9.4  PHOS 5.8*  --   --    < > = values in this interval not displayed.    Recent Labs  Lab 07/23/20 0520 07/24/20 0236 07/25/20 0620 07/26/20 0419 07/27/20 0429  WBC 19.2*   < > 15.4* 10.5 8.9  NEUTROABS 13.2*  --   --   --   --   HGB 11.4*   < > 10.0* 10.5* 10.7*  HCT 35.8*   < > 30.6* 32.6* 34.6*  MCV 96.0   < > 95.0 95.3 97.5  PLT 179   < > 173 185 195   < > = values in this interval not displayed.    Scheduled Meds:  (feeding supplement) PROSource Plus  30 mL Oral BID BM   aspirin EC  81 mg Oral Daily   atorvastatin  40 mg Oral Daily   Chlorhexidine Gluconate Cloth  6 each Topical Q1200   fiber  1 packet Oral BID   heparin injection (subcutaneous)  5,000 Units Subcutaneous Q8H   insulin aspart  0-15 Units Subcutaneous TID WC   insulin glargine  10 Units Subcutaneous Daily   lanthanum  500 mg Oral TID WC   multivitamin  1 tablet Oral QHS   sodium chloride flush  10-40 mL Intracatheter Q12H   Continuous Infusions:  sodium chloride     sodium chloride 10 mL/hr at 07/24/20 1900   sodium chloride     vancomycin     PRN Meds:.sodium chloride, sodium chloride, alteplase, heparin, lidocaine (PF), lidocaine-prilocaine, pentafluoroprop-tetrafluoroeth, sodium chloride, sodium  chloride flush   Donetta Potts,  MD 07/27/2020, 8:29 AM

## 2020-07-27 NOTE — Progress Notes (Signed)
Physical Therapy Treatment Patient Details Name: Jorge [name] MRN: [id number] DOB: 04-20-1970 Today's Date: 07/27/2020    History of Present Illness Jorge [name] is a 50 y.o. male admitted 07/13/20 with confusion during dialysis (incomplete session secondary to him pulling at lines), found to be volume overloaded, with labile blood pressures (initially normotensive, but hypertensive into the 200s with worsening altered mental status).  Due to continued agitation he was intubated to facilitate work-up for his altered mental status. ETT 7/7- 7/9 (self-extubated); 7/11-7/17. Cortrak 7/8.   MRI brain 7/7: 4 small acute infarctions, left cerebellar white matter, right posteroinferior parietal white matter and 2 in the left frontal white matter. On 7/11, pt was noted to be progressively encephalopathic with weakness, left gaze preference and decreased consciousness (GCS 8).  MRI brain 7/12: New acute infarct left centrum semiovale. Other evolving small areas of infarction on the prior study.  PMH: HTN, hyperlipidemia, diabetes, end-stage renal disease on peritoneal dialysis, right basal ganglia ICH with IVH and subarachnoid hemorrhage (hypertensive bleed, 10/03/2019), bilateral BKA    PT Comments    Pt with sheet over head on entry. Initially, would not take it down, stating he is tired and just wants to rest. Eventually agreeable to bed level exercise, however needs constant cuing for completion, despite multimodal cuing. Pt eventually stops responding to therapist requests and pull sheet back over his head. D/c back to [facility] with PT rehab remains appropriate. PT will continue to follow acutely.     Follow Up Recommendations  SNF;Supervision/Assistance - 24 hour     Equipment Recommendations  None recommended by PT       Precautions / Restrictions Precautions Precautions: Fall Precaution Comments: Bil LE prostheses, Cortrak Restrictions Weight Bearing Restrictions: No    Mobility  Bed  Mobility Overal bed mobility: Independent             General bed mobility comments: Pt able to come to long sit without assist.    Transfers Overall transfer level: Needs assistance Equipment used: Rolling walker (2 wheeled) Transfers: Sit to/from [facility] Sit to Stand: Mod assist;+2 safety/equipment Stand pivot transfers: Mod assist;+2 physical assistance       General transfer comment: Pt able to place his prostheses on without assist from CI. Pt did need assist to get them locked into place in standing with 2 standing attempts for making sure they were locked. Pt with anterior lean and difficulty achieveing full upright stance/getting his balance once up on bil prostheses.Pt was able to step around to chair with use of RW for support but needing mod assist for stability as pt with poor balance due to anterior lean.  Multiple cues for safety awareness and not following commands to stand upright.      Modified Rankin (Stroke Patients Only) Modified Rankin (Stroke Patients Only) Pre-Morbid Rankin Score: Moderate disability Modified Rankin: Severe disability     Balance Overall balance assessment: Needs assistance Sitting-balance support: No upper extremity supported;Feet supported;Bilateral upper extremity supported Sitting balance-Leahy Scale: Fair Sitting balance - Comments: Pt able to sit EOB withmin guard assist for 20 minutes without Ue support at times as he was able to put on prostheses without PT assist.   Standing balance support: Bilateral upper extremity supported;During functional activity Standing balance-Leahy Scale: Poor Standing balance comment: Pt needed min assist to stand statically and mod assist for dynamic balance with RW and external support. Very unsteady dynamically.  Cognition Arousal/Alertness: Awake/alert Behavior During Therapy: Flat affect Overall Cognitive Status: Impaired/Different  from baseline Area of Impairment: Orientation;Memory;Following commands;Safety/judgement;Awareness;Problem solving                 Orientation Level: Disoriented to;Place;Time;Situation   Memory: Decreased recall of precautions;Decreased short-term memory Following Commands: Follows one step commands inconsistently;Follows one step commands with increased time Safety/Judgement: Decreased awareness of safety;Decreased awareness of deficits   Problem Solving: Slow processing;Difficulty sequencing;Decreased initiation;Requires verbal cues;Requires tactile cues General Comments: Pt telling PT multiple times that he doesnt know when his right LE was amputated. In chart, pt had bil LE amputations in 3/21 at the same time. Pt unaware that right LE was amputated so long ago. Pt with left gaze preference as well as decr awareness of right hemibody. Pt confused as well as to place as he appeared tothink he was at home once he was in chair. He leaned over and spit in the floor and stated, "it will soak up in the pan."  Pt with confusion throughout session.      Exercises General Exercises - Lower Extremity Hip ABduction/ADduction: AROM;Both;10 reps;Supine Straight Leg Raises: AROM;Both;10 reps;Supine Hip Flexion/Marching: AROM;Both;10 reps;Supine    General Comments General comments (skin integrity, edema, etc.): Pt with sheet over his head for most of the session, VSS on RA      Pertinent Vitals/Pain Pain Assessment: No/denies pain     PT Goals (current goals can now be found in the care plan section) Acute Rehab PT Goals Patient Stated Goal: to go home PT Goal Formulation: With patient Time For Goal Achievement: 08/08/20 Potential to Achieve Goals: Good Progress towards PT goals: Not progressing toward goals - comment (fatigued, irritated)    Frequency    Min 3X/week      PT Plan Current plan remains appropriate       AM-PAC PT "6 Clicks" Mobility   Outcome Measure   Help needed turning from your back to your side while in a flat bed without using bedrails?: None Help needed moving from lying on your back to sitting on the side of a flat bed without using bedrails?: None Help needed moving to and from a bed to a chair (including a wheelchair)?: Total Help needed standing up from a chair using your arms (e.g., wheelchair or bedside chair)?: A Lot Help needed to walk in hospital room?: Total Help needed climbing 3-5 steps with a railing? : Total 6 Click Score: 13    End of Session Equipment Utilized During Treatment: Gait belt Activity Tolerance: Patient limited by fatigue Patient left: in chair;with call bell/phone within reach;with chair alarm set Nurse Communication: Mobility status;Need for lift equipment (use Stedy to return pt to bed.) PT Visit Diagnosis: Unsteadiness on feet (R26.81);Muscle weakness (generalized) (M62.81)     Time: [id number] PT Time Calculation (min) (ACUTE ONLY): 9 min  Charges:  $Therapeutic Exercise: 8-22 mins                     Karessa Onorato B. Migdalia Dk PT, DPT Acute Rehabilitation Services Pager [phone] Office [phone]    [place] 07/27/2020, 3:44 PM

## 2020-07-27 NOTE — Progress Notes (Signed)
Pharmacy Antibiotic Note  Jorge [name] is a 50 y.o. male with VDRF on pressors with concern of PNA and recent cultures with staphylococcus auricularis bacteremia 1/4  -WBC= 19.2,>8.9  -Noted with ESRD on HD MWF -Recently on antibiotics for PNA  Plan: -Vancomycin  '750mg'$  IV with HD MWF --Will follow  cultures and clinical progress   Height: '5\' 11"'$  (180.3 cm) Weight: 63.3 kg (139 lb 8.8 oz) IBW/kg (Calculated) : 75.3  Temp (24hrs), Avg:98.5 F (36.9 C), Min:98.1 F (36.7 C), Max:98.8 F (37.1 C)  Recent Labs  Lab 07/23/20 0520 07/24/20 0236 07/25/20 0620 07/26/20 0419 07/27/20 0429  WBC 19.2* 18.6* 15.4* 10.5 8.9  CREATININE 5.54* 6.88* 9.23* 5.52* 7.54*  LATICACIDVEN  --  1.6  --   --   --      Estimated Creatinine Clearance: 10.3 mL/min (A) (by C-G formula based on SCr of 7.54 mg/dL (H)).    Allergies  Allergen Reactions   Lactose Intolerance (Gi) Diarrhea and Nausea Only      Wisam Siefring A. Levada Dy, PharmD, BCPS, FNKF Clinical Pharmacist [place] Please utilize Amion for appropriate phone number to reach the unit pharmacist ([place])

## 2020-07-28 ENCOUNTER — Encounter (INDEPENDENT_AMBULATORY_CARE_PROVIDER_SITE_OTHER): Payer: Medicare Other | Admitting: Ophthalmology

## 2020-07-28 DIAGNOSIS — N186 End stage renal disease: Secondary | ICD-10-CM | POA: Diagnosis not present

## 2020-07-28 DIAGNOSIS — D638 Anemia in other chronic diseases classified elsewhere: Secondary | ICD-10-CM | POA: Diagnosis not present

## 2020-07-28 LAB — GLUCOSE, CAPILLARY
Glucose-Capillary: 46 mg/dL — ABNORMAL LOW (ref 70–99)
Glucose-Capillary: 47 mg/dL — ABNORMAL LOW (ref 70–99)
Glucose-Capillary: 137 mg/dL — ABNORMAL HIGH (ref 70–99)
Glucose-Capillary: 135 mg/dL — ABNORMAL HIGH (ref 70–99)
Glucose-Capillary: 31 mg/dL — CL (ref 70–99)
Glucose-Capillary: 115 mg/dL — ABNORMAL HIGH (ref 70–99)
Glucose-Capillary: 147 mg/dL — ABNORMAL HIGH (ref 70–99)

## 2020-07-28 LAB — BASIC METABOLIC PANEL
Anion gap: 11 (ref 5–15)
BUN: 20 mg/dL (ref 6–20)
CO2: 27 mmol/L (ref 22–32)
Calcium: 9.2 mg/dL (ref 8.9–10.3)
Chloride: 101 mmol/L (ref 98–111)
Creatinine, Ser: 5.14 mg/dL — ABNORMAL HIGH (ref 0.61–1.24)
GFR, Estimated: 13 mL/min — ABNORMAL LOW (ref >60)
Glucose, Bld: 48 mg/dL — ABNORMAL LOW (ref 70–99)
Potassium: 3.8 mmol/L (ref 3.5–5.1)
Sodium: 139 mmol/L (ref 135–145)

## 2020-07-28 LAB — MISC LABCORP TEST (SEND OUT): Labcorp test code: 138693

## 2020-07-28 LAB — CBC
HCT: 34 % — ABNORMAL LOW (ref 39.0–52.0)
Hemoglobin: 10.8 g/dL — ABNORMAL LOW (ref 13.0–17.0)
MCH: 30.9 pg (ref 26.0–34.0)
MCHC: 31.8 g/dL (ref 30.0–36.0)
MCV: 97.1 fL (ref 80.0–100.0)
Platelets: 188 10*3/uL (ref 150–400)
RBC: 3.5 MIL/uL — ABNORMAL LOW (ref 4.22–5.81)
RDW: 15.4 % (ref 11.5–15.5)
WBC: 6.8 10*3/uL (ref 4.0–10.5)
nRBC: 0 % (ref 0.0–0.2)

## 2020-07-28 MED ORDER — DEXTROSE 50 % IV SOLN: 50.0000 mL | Freq: Once | INTRAVENOUS | Status: AC

## 2020-07-28 MED ORDER — DIPHENHYDRAMINE-ZINC ACETATE 2-0.1 % EX CREA
TOPICAL_CREAM | Freq: Every day | CUTANEOUS | Status: DC | PRN
  Administered 2020-07-28: 1 via TOPICAL
  Filled 2020-07-28: qty 28

## 2020-07-28 MED ORDER — GABAPENTIN 100 MG PO CAPS
100.0000 mg | ORAL_CAPSULE | Freq: Three times a day (TID) | ORAL | Status: DC
  Administered 2020-07-28 – 2020-07-29 (×5): 100 mg via ORAL
  Filled 2020-07-28 (×6): qty 1

## 2020-07-28 MED ORDER — DEXTROSE 50 % IV SOLN
INTRAVENOUS | Status: AC
  Administered 2020-07-28: 50 mL
  Filled 2020-07-28: qty 50

## 2020-07-28 MED ORDER — INSULIN GLARGINE 100 UNIT/ML ~~LOC~~ SOLN: 5.0000 [IU] | Freq: Every day | SUBCUTANEOUS | Status: DC

## 2020-07-28 MED ORDER — NEPRO/CARBSTEADY PO LIQD: 237.0000 mL | Freq: Three times a day (TID) | ORAL | Status: DC

## 2020-07-28 MED ORDER — NEPRO/CARBSTEADY PO LIQD
237.0000 mL | Freq: Two times a day (BID) | ORAL | Status: DC
  Administered 2020-07-28 – 2020-07-29 (×2): 237 mL via ORAL

## 2020-07-28 MED ORDER — SERTRALINE HCL 50 MG PO TABS
25.0000 mg | ORAL_TABLET | Freq: Every day | ORAL | Status: DC
  Administered 2020-07-28 – 2020-07-29 (×2): 25 mg via ORAL
  Filled 2020-07-28 (×2): qty 1

## 2020-07-28 MED ORDER — DEXTROSE 50 % IV SOLN
50.0000 mL | Freq: Once | INTRAVENOUS | Status: AC
  Administered 2020-07-28: 50 mL via INTRAVENOUS

## 2020-07-28 NOTE — Progress Notes (Signed)
Occupational Therapy Treatment Patient Details Name: Jorge [name] MRN: [id number] DOB: August 10, 1970 Today's Date: 07/28/2020    History of present illness Jorge [name] is a 50 y.o. male admitted 07/13/20 with confusion during dialysis (incomplete session secondary to him pulling at lines), found to be volume overloaded, with labile blood pressures (initially normotensive, but hypertensive into the 200s with worsening altered mental status).  Due to continued agitation he was intubated to facilitate work-up for his altered mental status. ETT 7/7- 7/9 (self-extubated); 7/11-7/17. Cortrak 7/8.   MRI brain 7/7: 4 small acute infarctions, left cerebellar white matter, right posteroinferior parietal white matter and 2 in the left frontal white matter. On 7/11, pt was noted to be progressively encephalopathic with weakness, left gaze preference and decreased consciousness (GCS 8).  MRI brain 7/12: New acute infarct left centrum semiovale. Other evolving small areas of infarction on the prior study.  PMH: HTN, hyperlipidemia, diabetes, end-stage renal disease on peritoneal dialysis, right basal ganglia ICH with IVH and subarachnoid hemorrhage (hypertensive bleed, 10/03/2019), bilateral BKA   OT comments  Pt received supine in bed, not agreeable to OT/PT session, however, required heavy encouragement and education on importance of functional mobility/strength to regain independence with ADLs prior to DC. Pt refuses to sit up EOB due to pain of bottom, pt assessed and noticed pt requiring bandage, cleaning and changing of linens. Pt agreeable for pericare with set up, set up for grooming in long sitting, Ind with rolling B sides for changing of linens. Pt will benefit to continued acute OT and skilled OT to address established deficits to safely DC to post acute rehab setting and to improve independence with ADLs. DC recommendation and frequency remains the same.    Follow Up Recommendations  SNF    Equipment  Recommendations  Other (comment) (Defer to next level of care.)    Recommendations for Other Services      Precautions / Restrictions Precautions Precautions: Fall Precaution Comments: Bil LE prostheses, Cortrak       Mobility Bed Mobility Overal bed mobility: Independent   Rolling: Independent         General bed mobility comments: Pt observed to roll towards B side, for pericare and changing of soiled linens.    Transfers Overall transfer level: Needs assistance               General transfer comment: Pt declined functional mobility this session, with multiple attempts of encouragement.    Balance Overall balance assessment: Needs assistance Sitting-balance support: No upper extremity supported;Feet supported;Bilateral upper extremity supported Sitting balance-Leahy Scale: Fair Sitting balance - Comments: Pt able to sit in long sitting to perform washing face without UE support .   Standing balance support: Bilateral upper extremity supported;During functional activity Standing balance-Leahy Scale: Poor                             ADL either performed or assessed with clinical judgement   ADL Overall ADL's : Needs assistance/impaired     Grooming: Wash/dry face;Set up;Bed level (long sitting) Grooming Details (indicate cue type and reason): Pt agreeable to long sitting at bed level to wash face with set up.     Lower Body Bathing: Set up;Bed level Lower Body Bathing Details (indicate cue type and reason): Pt presented with wash cloth for self cleaning, heavy encouragement to perform task, pt presented with soiled limited.  General ADL Comments: Pt declined functional mobility this session even with heavy encouragement, will re attempt next session.     Vision   Vision Assessment?: Vision impaired- to be further tested in functional context   Perception     Praxis      Cognition Arousal/Alertness:  Awake/alert Behavior During Therapy: Flat affect Overall Cognitive Status: Impaired/Different from baseline Area of Impairment: Orientation;Memory;Following commands;Safety/judgement;Awareness;Problem solving                 Orientation Level: Disoriented to;Place;Time;Situation   Memory: Decreased recall of precautions;Decreased short-term memory Following Commands: Follows one step commands inconsistently;Follows one step commands with increased time Safety/Judgement: Decreased awareness of safety;Decreased awareness of deficits   Problem Solving: Slow processing;Difficulty sequencing;Decreased initiation;Requires verbal cues;Requires tactile cues General Comments: Pt continuosly requiring encouragement to participate in Co-treat OT/PT session.        Exercises     Shoulder Instructions       General Comments      Pertinent Vitals/ Pain       Pain Assessment: Faces Faces Pain Scale: Hurts a little bit Pain Location: bottom Pain Descriptors / Indicators: Discomfort Pain Intervention(s): Repositioned;Monitored during session  Home Living                                          Prior Functioning/Environment              Frequency  Min 2X/week        Progress Toward Goals  OT Goals(current goals can now be found in the care plan section)  Progress towards OT goals: Progressing toward goals  Acute Rehab OT Goals Patient Stated Goal: to go home OT Goal Formulation: With patient Time For Goal Achievement: 08/09/20 Potential to Achieve Goals: Good ADL Goals Pt Will Perform Eating: Independently Pt Will Perform Grooming: standing;with supervision Pt Will Perform Upper Body Dressing: with set-up;sitting Pt Will Perform Lower Body Dressing: sit to/from stand;with supervision Pt Will Transfer to Toilet: ambulating;with supervision Pt Will Perform Toileting - Clothing Manipulation and hygiene: with supervision;sit to/from stand Additional  ADL Goal #1: Patient will score <4/28 on SBT indicating increased cognition in prep for ADLs/IADLs.  Plan Discharge plan remains appropriate;Frequency remains appropriate    Co-evaluation    PT/OT/SLP Co-Evaluation/Treatment: Yes Reason for Co-Treatment: Complexity of the patient's impairments (multi-system involvement);For patient/therapist safety;To address functional/ADL transfers   OT goals addressed during session: ADL's and self-care;Strengthening/ROM      AM-PAC OT "6 Clicks" Daily Activity     Outcome Measure   Help from another person eating meals?: A Little Help from another person taking care of personal grooming?: A Little Help from another person toileting, which includes using toliet, bedpan, or urinal?: A Lot Help from another person bathing (including washing, rinsing, drying)?: A Little Help from another person to put on and taking off regular upper body clothing?: A Little Help from another person to put on and taking off regular lower body clothing?: A Lot 6 Click Score: 16    End of Session    OT Visit Diagnosis: Unsteadiness on feet (R26.81);Other abnormalities of gait and mobility (R26.89);Muscle weakness (generalized) (M62.81)   Activity Tolerance Treatment limited secondary to agitation   Patient Left with call bell/phone within reach;in bed;with bed alarm set   Nurse Communication Mobility status;Other (comment) (Response to treatment)        Time: [phone]  OT Time Calculation (min): 28 min  Charges: OT General Charges $OT Visit: 1 Visit OT Treatments $Self Care/Home Management : 8-22 mins  Minus Breeding, MSOT, OTR/L  Supplemental Rehabilitation Services  [phone]    Marius Ditch 07/28/2020, 11:47 AM

## 2020-07-28 NOTE — Plan of Care (Signed)
  Problem: Coping: Goal: Level of anxiety will decrease Outcome: Progressing   Problem: Elimination: Goal: Will not experience complications related to bowel motility Outcome: Progressing   

## 2020-07-28 NOTE — Progress Notes (Signed)
Physical Therapy Treatment Patient Details Name: Jorge [name] MRN: [id number] DOB: 18-Apr-1970 Today's Date: 07/28/2020    History of Present Illness Jorge [name] is a 50 y.o. male admitted 07/13/20 with confusion during dialysis (incomplete session secondary to him pulling at lines), found to be volume overloaded, with labile blood pressures (initially normotensive, but hypertensive into the 200s with worsening altered mental status).  Due to continued agitation he was intubated to facilitate work-up for his altered mental status. ETT 7/7- 7/9 (self-extubated); 7/11-7/17. Cortrak 7/8.   MRI brain 7/7: 4 small acute infarctions, left cerebellar white matter, right posteroinferior parietal white matter and 2 in the left frontal white matter. On 7/11, pt was noted to be progressively encephalopathic with weakness, left gaze preference and decreased consciousness (GCS 8).  MRI brain 7/12: New acute infarct left centrum semiovale. Other evolving small areas of infarction on the prior study.  PMH: HTN, hyperlipidemia, diabetes, end-stage renal disease on peritoneal dialysis, right basal ganglia ICH with IVH and subarachnoid hemorrhage (hypertensive bleed, 10/03/2019), bilateral BKA    PT Comments    Pt supine in bed reporting he does not want to get up from bed. Pt agitated about not being able to discharge and reports feeling like the MD is not listening to him. Pt begins refusing therapy services because his bottom hurts, reporting that he just wants to find his phone. With rolling independently in bed, able to find phone and in the process pt found to have stool on sheet and gown. Pt reluctantly agreeable to allow therapy to assist in cleaning up. Pt does not appear to have any issues with sacral pressure, but does experience pain with pericare. Pt cleaned, barrier cream and sacral foam applied. Pt able to come to longsitting in bed with modified assistance using bed rails. Pt performs bathing tasks and  again refuses OOB mobility. D/c plans remain appropriate. PT will continue to follow acutely.    Follow Up Recommendations  SNF;Supervision/Assistance - 24 hour     Equipment Recommendations  None recommended by PT       Precautions / Restrictions Precautions Precautions: Fall Precaution Comments: Bil LE prostheses, Cortrak    Mobility  Bed Mobility Overal bed mobility: Independent   Rolling: Independent         General bed mobility comments: Pt observed to roll towards B side, for pericare and changing of soiled linens.    Transfers Overall transfer level: Needs assistance               General transfer comment: Pt declined functional mobility this session, with multiple attempts of encouragement.     Balance Overall balance assessment: Needs assistance Sitting-balance support: No upper extremity supported;Feet supported;Bilateral upper extremity supported Sitting balance-Leahy Scale: Fair Sitting balance - Comments: Pt able to sit in long sitting to perform washing face without UE support .   Standing balance support: Bilateral upper extremity supported;During functional activity Standing balance-Leahy Scale: Poor                              Cognition Arousal/Alertness: Awake/alert Behavior During Therapy: Flat affect Overall Cognitive Status: Impaired/Different from baseline Area of Impairment: Orientation;Memory;Following commands;Safety/judgement;Awareness;Problem solving                 Orientation Level: Disoriented to;Place;Time;Situation   Memory: Decreased recall of precautions;Decreased short-term memory Following Commands: Follows one step commands inconsistently;Follows one step commands with increased time Safety/Judgement: Decreased awareness  of safety;Decreased awareness of deficits   Problem Solving: Slow processing;Difficulty sequencing;Decreased initiation;Requires verbal cues;Requires tactile cues General Comments:  no memory of PT session yesterday, Pt continuosly requiring encouragement to participate in Co-treat OT/PT session.         General Comments General comments (skin integrity, edema, etc.): VSS on RA, pt with reports of pain in sacral area, did not see evidence of pressure injury however possible skin breakdown from stool, applied barrier cream      Pertinent Vitals/Pain Pain Assessment: Faces Faces Pain Scale: Hurts a little bit Pain Location: bottom Pain Descriptors / Indicators: Discomfort Pain Intervention(s): Repositioned;Monitored during session;Other (comment) (barrier cream and sacral foam applied)     PT Goals (current goals can now be found in the care plan section) Acute Rehab PT Goals Patient Stated Goal: to go home PT Goal Formulation: With patient Time For Goal Achievement: 08/08/20 Potential to Achieve Goals: Good Progress towards PT goals: Not progressing toward goals - comment (self limiting)    Frequency    Min 3X/week      PT Plan Current plan remains appropriate    Co-evaluation PT/OT/SLP Co-Evaluation/Treatment: Yes Reason for Co-Treatment: Complexity of the patient's impairments (multi-system involvement) PT goals addressed during session: Mobility/safety with mobility OT goals addressed during session: ADL's and self-care;Strengthening/ROM      AM-PAC PT "6 Clicks" Mobility   Outcome Measure  Help needed turning from your back to your side while in a flat bed without using bedrails?: None Help needed moving from lying on your back to sitting on the side of a flat bed without using bedrails?: None Help needed moving to and from a bed to a chair (including a wheelchair)?: Total Help needed standing up from a chair using your arms (e.g., wheelchair or bedside chair)?: Total Help needed to walk in hospital room?: Total Help needed climbing 3-5 steps with a railing? : Total 6 Click Score: 12    End of Session   Activity Tolerance: Other  (comment);Patient tolerated treatment well Patient left: in chair;with call bell/phone within reach;with chair alarm set Nurse Communication: Mobility status PT Visit Diagnosis: Unsteadiness on feet (R26.81);Muscle weakness (generalized) (M62.81)     Time: [id number] PT Time Calculation (min) (ACUTE ONLY): 27 min  Charges:  $Therapeutic Activity: 8-22 mins                     Ashrita Chrismer B. Migdalia Dk PT, DPT Acute Rehabilitation Services Pager [phone] Office [phone]    [place] 07/28/2020, 12:08 PM

## 2020-07-28 NOTE — Progress Notes (Signed)
PROGRESS NOTE    Jorge [name]  [id number] DOB: 29-Oct-1970 DOA: 07/13/2020 PCP: Lauree Chandler, NP   Brief Narrative:  Jorge [name] is a 50 y.o. male with PMHx significant for HTN, T2DM, ESRD (on HD MWF), bilateral BKA, ICH, TIA admitted 07/13/2020 with AMS and volume overload, patient presented from dialysis after 1 hour he became confused pulling at lines somewhat combative noted to be hypertensive and hypoxic with worsening mental status in the ED upon presentation on 07/13/2020.  Initially admitted to the floor with worsening mental status deterioration concern for airway protection and was intubated on 07/14/2020.  Patient self extubated on 07/16/2020, stabilizing over the past week.  Patient noted to have gaze preference with a GCS of 8 and unilateral weakness code stroke was called -CT head showed small subacute infarcts previously documented -PCCM reconsulted on the 11th to reintubate the patient given worsening clinical picture and airway compromise.  TEE on the 14th showed a small PFO, concern for HCAP versus aspiration pneumonia in the interim vancomycin and Zosyn initiated 07/23/2020-de-escalate to vancomycin only given sputum cultures positive for MRSA as of 07/26/2020.  Subjective No acute issues or events overnight, patient's mental status continues to be somewhat labile but improving, denies nausea vomiting diarrhea constipation headache fevers or chills.  Assessment & Plan:   Principal Problem:   AMS (altered mental status) Active Problems:   ESRD (end stage renal disease) on dialysis ([place])   Hypertension associated with diabetes ([place])   Type 2 diabetes mellitus with chronic kidney disease on chronic dialysis, with long-term current use of insulin ([name])   Anemia of chronic disease   Acute encephalopathy   Hyperlipidemia associated with type 2 diabetes mellitus ([name])   Hypertensive urgency   Malnutrition of moderate degree  Acute metabolic encephalopathy, multifactorial,  improving Multiple bilateral CVA, acute, likely contributing  - Continue aspirin, statin - MRI shows new acute infarct left centrum semiovale and evolving small areas of infarction on the prior study.evolving small areas of infarction on the prior study - concerning for recrudescence of old strokes on top of apparent new stroke likely secondary to hypertensive emergency as below as well as profound hypoxia    Acute hypoxemic respiratory failure secondary to encephalopathy, resolved Suspected right lower lobe HCAP versus aspiration -Successfully extubated on 07/24/2020 currently on room air  -Continues on vancomycin only given MRSA positive sputum cultures -Concern for aspiration in house given requirement for reintubation and mental status changes as outlined above in the setting of acute stroke   Septic shock, POA, resolved Right lower lobe suspected aspiration pneumonia, POA Recurrent aspiration event with worsening right lower lobe infiltrate Rule out bacteremia - Culture 7/6 staph hominis, then repeat culture with staph auricularis in 1/4. Unclear significance, central line removed 7/15 and TEE reassuring. 7/16 respiratory culture growing staph aureus - Empiric vancomycin, Zosyn added on 7/16, narrowed to vancomycin only 7/19 - Off pressors since 07/24/2020   History of hypertension - Holding antihypertensives in the setting of shock and subsequent hypotension - Continue to follow  ESRD on iHD -Appreciate nephrology management, intermittent HD MWF, now at dry weight -Following BMP, I/O   T2DM with hyperglycemia - now hypoglycemia due to poor PO intake -Lantus discontinued -Sliding scale insulin protocol -Dysphagia 3 diet, NG tube removed   DVT prophylaxis: Heparin Code Status: Full Family Communication: None present -sister unavailable by phone, voicemail left  Status is: Inpatient  [name]: The patient is from: Home  Anticipated d/c is to: To be determined               Anticipated d/c date is: > 72 hours              Patient currently not medically stable for discharge  Consultants:  PCCM, nephrology  Antimicrobials:  Vancomycin ongoing  Objective: Vitals:   07/27/20 1200 07/27/20 1339 07/27/20 1826 07/28/20 0608  BP: (!) 127/42 (!) 142/36 (!) 111/35 (!) 153/77  Pulse: 67 74 67 65  Resp:  '17 17 16  '$ Temp: 99 F (37.2 C) 98 F (36.7 C) 98.2 F (36.8 C) 98.6 F (37 C)  TempSrc: Oral Oral    SpO2: 94% 97% 92% 98%  Weight: 62 kg     Height:        Intake/Output Summary (Last 24 hours) at 07/28/2020 0817 Last data filed at 07/27/2020 1851 Gross per 24 hour  Intake 240 ml  Output 1000 ml  Net -760 ml    Filed Weights   07/26/20 0554 07/27/20 0807 07/27/20 1200  Weight: 66.2 kg 63.3 kg 62 kg    Examination:  General:  Pleasantly resting in bed, No acute distress. HEENT:  Normocephalic atraumatic.  Sclerae nonicteric, noninjected.  Extraocular movements intact bilaterally.  Neck:  Without mass or deformity.  Trachea is midline. Lungs:  Clear to auscultate bilaterally without rhonchi, wheeze, or rales. Heart:  Regular rate and rhythm.  Without murmurs, rubs, or gallops. Abdomen:  Soft, nontender, nondistended.  Without guarding or rebound. Extremities: Without cyanosis, clubbing, edema, or obvious deformity.  Fecal tube intact draining watery stool.  Right upper extremity dialysis fistula noted Vascular:  Dorsalis pedis and posterior tibial pulses palpable bilaterally. Skin:  Warm and dry, no erythema, no ulcerations.  Data Reviewed: I have personally reviewed following labs and imaging studies  CBC: Recent Labs  Lab 07/23/20 0520 07/24/20 0236 07/25/20 0620 07/26/20 0419 07/27/20 0429 07/28/20 0544  WBC 19.2* 18.6* 15.4* 10.5 8.9 6.8  NEUTROABS 13.2*  --   --   --   --   --   HGB 11.4* 10.9* 10.0* 10.5* 10.7* 10.8*  HCT 35.8* 34.9* 30.6* 32.6* 34.6* 34.0*  MCV 96.0 98.0 95.0 95.3 97.5 97.1  PLT 179 161 173 185 195 188     Basic Metabolic Panel: Recent Labs  Lab 07/23/20 0520 07/24/20 0236 07/25/20 0620 07/26/20 0419 07/27/20 0429 07/28/20 0544  NA 136 132* 137 139 138 139  K 3.8 3.5 3.4* 3.4* 3.7 3.8  CL 98 96* 99 98 100 101  CO2 27 22 20* '26 22 27  '$ GLUCOSE 148* 147* 147* 148* 145* 48*  BUN 43* 61* 86* 34* 45* 20  CREATININE 5.54* 6.88* 9.23* 5.52* 7.54* 5.14*  CALCIUM 9.7 9.3 9.4 9.0 9.4 9.2  MG 2.2 2.3 2.6*  --   --   --   PHOS  --  5.1* 5.8*  --   --   --     GFR: Estimated Creatinine Clearance: 14.7 mL/min (A) (by C-G formula based on SCr of 5.14 mg/dL (H)). Liver Function Tests: Recent Labs  Lab 07/24/20 0236  AST 103*  ALT 106*  ALKPHOS 144*  BILITOT 0.4  PROT 7.5  ALBUMIN 2.6*    No results for input(s): LIPASE, AMYLASE in the last 168 hours. No results for input(s): AMMONIA in the last 168 hours. Coagulation Profile: No results for input(s): INR, PROTIME in the last 168 hours. Cardiac Enzymes: No results for input(s): CKTOTAL, CKMB, CKMBINDEX,  TROPONINI in the last 168 hours. BNP (last 3 results) No results for input(s): PROBNP in the last 8760 hours. HbA1C: No results for input(s): HGBA1C in the last 72 hours. CBG: Recent Labs  Lab 07/27/20 0627 07/27/20 1336 07/27/20 1649 07/28/20 0721 07/28/20 0758  GLUCAP 143* 96 82 46* 47*    Lipid Profile: No results for input(s): CHOL, HDL, LDLCALC, TRIG, CHOLHDL, LDLDIRECT in the last 72 hours. Thyroid Function Tests: No results for input(s): TSH, T4TOTAL, FREET4, T3FREE, THYROIDAB in the last 72 hours. Anemia Panel: No results for input(s): VITAMINB12, FOLATE, FERRITIN, TIBC, IRON, RETICCTPCT in the last 72 hours. Sepsis Labs: Recent Labs  Lab 07/24/20 0236  LATICACIDVEN 1.6     Recent Results (from the past 240 hour(s))  Culture, blood (routine x 2)     Status: None   Collection Time: 07/19/20 10:25 PM   Specimen: BLOOD  Result Value Ref Range Status   Specimen Description BLOOD SITE NOT SPECIFIED   Final   Special Requests   Final    BOTTLES DRAWN AEROBIC ONLY Blood Culture adequate volume   Culture   Final    NO GROWTH 5 DAYS Performed at [facility], [address] [place], [place] [address]    Report Status 07/24/2020 FINAL  Final  Culture, blood (Routine X 2) w Reflex to ID Panel     Status: Abnormal   Collection Time: 07/20/20  6:29 AM   Specimen: BLOOD LEFT HAND  Result Value Ref Range Status   Specimen Description BLOOD LEFT HAND  Final   Special Requests   Final    BOTTLES DRAWN AEROBIC AND ANAEROBIC Blood Culture results may not be optimal due to an inadequate volume of blood received in culture bottles   Culture  Setup Time   Final    GRAM POSITIVE COCCI ANAEROBIC BOTTLE ONLY CRITICAL VALUE NOTED.  VALUE IS CONSISTENT WITH PREVIOUSLY REPORTED AND CALLED VALUE.    Culture (A)  Final    STAPHYLOCOCCUS AURICULARIS THE SIGNIFICANCE OF ISOLATING THIS ORGANISM FROM A SINGLE SET OF BLOOD CULTURES WHEN MULTIPLE SETS ARE DRAWN IS UNCERTAIN. PLEASE NOTIFY THE MICROBIOLOGY DEPARTMENT WITHIN ONE WEEK IF SPECIATION AND SENSITIVITIES ARE REQUIRED. Performed at [facility], [place] [address], [place], [place] [address]    Report Status 07/23/2020 FINAL  Final  Culture, Respiratory w Gram Stain     Status: None   Collection Time: 07/23/20 10:41 AM   Specimen: Tracheal Aspirate; Respiratory  Result Value Ref Range Status   Specimen Description TRACHEAL ASPIRATE  Final   Special Requests NONE  Final   Gram Stain   Final    ABUNDANT WBC PRESENT,BOTH PMN AND MONONUCLEAR ABUNDANT GRAM POSITIVE RODS FEW GRAM POSITIVE COCCI    Culture   Final    MODERATE METHICILLIN RESISTANT STAPHYLOCOCCUS AUREUS No Pseudomonas species isolated ABUNDANT DIPHTHEROIDS(CORYNEBACTERIUM SPECIES) Standardized susceptibility testing for this organism is not available. Performed at [facility], [place], [place], [place] [address]    Report Status 07/26/2020 FINAL  Final    Organism ID, Bacteria METHICILLIN RESISTANT STAPHYLOCOCCUS AUREUS  Final      Susceptibility   Methicillin resistant staphylococcus aureus - MIC*    CIPROFLOXACIN >=8 RESISTANT Resistant     ERYTHROMYCIN >=8 RESISTANT Resistant     GENTAMICIN <=0.5 SENSITIVE Sensitive     OXACILLIN >=4 RESISTANT Resistant     TETRACYCLINE <=1 SENSITIVE Sensitive     VANCOMYCIN 1 SENSITIVE Sensitive  TRIMETH/SULFA 80 RESISTANT Resistant     CLINDAMYCIN >=8 RESISTANT Resistant     RIFAMPIN <=0.5 SENSITIVE Sensitive     Inducible Clindamycin NEGATIVE Sensitive     * MODERATE METHICILLIN RESISTANT STAPHYLOCOCCUS AUREUS     Radiology Studies: No results found.  Scheduled Meds:  (feeding supplement) PROSource Plus  30 mL Oral BID BM   aspirin EC  81 mg Oral Daily   atorvastatin  40 mg Oral Daily   Chlorhexidine Gluconate Cloth  6 each Topical Q1200   fiber  1 packet Oral BID   heparin injection (subcutaneous)  5,000 Units Subcutaneous Q8H   insulin aspart  0-15 Units Subcutaneous TID WC   insulin glargine  10 Units Subcutaneous Daily   lanthanum  500 mg Oral TID WC   multivitamin  1 tablet Oral QHS   sodium chloride flush  10-40 mL Intracatheter Q12H   Continuous Infusions:  sodium chloride     sodium chloride 10 mL/hr at 07/24/20 1900   sodium chloride     vancomycin      LOS: 15 days   Time spent: 43mn  [place] C Rumi Kolodziej, DO [facility]  If 7PM-7AM, please contact night-coverage [email or web address]  07/28/2020, 8:17 AM

## 2020-07-28 NOTE — Progress Notes (Signed)
Hypoglycemic Event  CBG: 31   Treatment: 4 oz juice/soda and D50 50 mL (25 gm)  Symptoms: Hungry  Follow-up CBG: [id number]  CBG Result:131  Possible Reasons for Event: Inadequate meal intake  Comments/MD notified:yes    Aran Menning L Malachi Carl

## 2020-07-28 NOTE — Progress Notes (Signed)
Hypoglycemic Event  CBG: 47   Treatment: D50 50 mL (25 gm)  Symptoms: None  Follow-up CBG: [id number]  CBG Result:137   Possible Reasons for Event: Inadequate meal intake and Unknown  Comments/MD notified:yes    [place]

## 2020-07-28 NOTE — Progress Notes (Addendum)
[facility] Progress Note   Subjective:  Seen in room - feeling fine, appropriate in speech but somewhat flat. Denies CP or dyspnea. Did fine with HD yesterday, net 1L UF removal.  Objective Vitals:   07/27/20 1200 07/27/20 1339 07/27/20 1826 07/28/20 0608  BP: (!) 127/42 (!) 142/36 (!) 111/35 (!) 153/77  Pulse: 67 74 67 65  Resp:  '17 17 16  '$ Temp: 99 F (37.2 C) 98 F (36.7 C) 98.2 F (36.8 C) 98.6 F (37 C)  TempSrc: Oral Oral    SpO2: 94% 97% 92% 98%  Weight: 62 kg     Height:       Physical Exam General: Well appearing man, NAD. Room air. Heart: RRR; no murmur Lungs: CTAB Abdomen: soft Extremities: No LE edema Dialysis Access: AVF + bruit  Additional Objective Labs: Basic Metabolic Panel: Recent Labs  Lab 07/24/20 0236 07/25/20 0620 07/26/20 0419 07/27/20 0429 07/28/20 0544  NA 132* 137 139 138 139  K 3.5 3.4* 3.4* 3.7 3.8  CL 96* 99 98 100 101  CO2 22 20* '26 22 27  '$ GLUCOSE 147* 147* 148* 145* 48*  BUN 61* 86* 34* 45* 20  CREATININE 6.88* 9.23* 5.52* 7.54* 5.14*  CALCIUM 9.3 9.4 9.0 9.4 9.2  PHOS 5.1* 5.8*  --   --   --    Liver Function Tests: Recent Labs  Lab 07/24/20 0236  AST 103*  ALT 106*  ALKPHOS 144*  BILITOT 0.4  PROT 7.5  ALBUMIN 2.6*   CBC: Recent Labs  Lab 07/23/20 0520 07/24/20 0236 07/25/20 0620 07/26/20 0419 07/27/20 0429 07/28/20 0544  WBC 19.2* 18.6* 15.4* 10.5 8.9 6.8  NEUTROABS 13.2*  --   --   --   --   --   HGB 11.4* 10.9* 10.0* 10.5* 10.7* 10.8*  HCT 35.8* 34.9* 30.6* 32.6* 34.6* 34.0*  MCV 96.0 98.0 95.0 95.3 97.5 97.1  PLT 179 161 173 185 195 188   Blood Culture    Component Value Date/Time   SDES TRACHEAL ASPIRATE 07/23/2020 1041   SPECREQUEST NONE 07/23/2020 1041   CULT  07/23/2020 1041    MODERATE METHICILLIN RESISTANT STAPHYLOCOCCUS AUREUS No Pseudomonas species isolated ABUNDANT DIPHTHEROIDS(CORYNEBACTERIUM SPECIES) Standardized susceptibility testing for this organism is not  available. Performed at [facility], [place] [address] [place], [place] [address]    REPTSTATUS 07/26/2020 FINAL 07/23/2020 1041   Medications:  sodium chloride     sodium chloride 10 mL/hr at 07/24/20 1900   sodium chloride     vancomycin      (feeding supplement) PROSource Plus  30 mL Oral BID BM   aspirin EC  81 mg Oral Daily   atorvastatin  40 mg Oral Daily   Chlorhexidine Gluconate Cloth  6 each Topical Q1200   dextrose  50 mL Intravenous Once   fiber  1 packet Oral BID   heparin injection (subcutaneous)  5,000 Units Subcutaneous Q8H   insulin aspart  0-15 Units Subcutaneous TID WC   insulin glargine  10 Units Subcutaneous Daily   lanthanum  500 mg Oral TID WC   multivitamin  1 tablet Oral QHS   sodium chloride flush  10-40 mL Intracatheter Q12H    Dialysis Orders: MWF [place]  4h   68kg  2/2.5 bath  AVF  P2  Hep none - Hectoral 46mg Iv q HD  Assessment/Plan: ESRD: Continue HD per usual MWF schedule - next tomorrow. AMS: Multiple bilateral CVA's. Neuro following. Staph hominis bacteremia (  7/6), then repeat grew Staph auricularis (Cx 7/13): On Vanc. Sputum Cx MRSA (7/16): On Vanc.   Acute hypoxic respiratory failure - due to RLL PNA, improved/extubated. HTN/Volume: BP stable, no edema on exam. Anemia of ESRD: Hgb 10.8, fine without ESA for now. CKD-BMD: CorrCa high, VDRA on hold. Phos ok - continue Fosrenol as binder. T2DM: SSI, per primary. Disposition - will likely require SNF or CIR  Veneta Penton, PA-C 07/28/2020, 10:14 AM  [facility]  I have seen and examined this patient and agree with plan and assessment in the above note with renal recommendations/intervention highlighted.  [place] John A Ry Moody,MD 07/28/2020 11:27 AM

## 2020-07-29 DIAGNOSIS — D631 Anemia in chronic kidney disease: Secondary | ICD-10-CM | POA: Diagnosis not present

## 2020-07-29 DIAGNOSIS — I160 Hypertensive urgency: Secondary | ICD-10-CM | POA: Diagnosis not present

## 2020-07-29 DIAGNOSIS — M6281 Muscle weakness (generalized): Secondary | ICD-10-CM | POA: Diagnosis not present

## 2020-07-29 DIAGNOSIS — E785 Hyperlipidemia, unspecified: Secondary | ICD-10-CM | POA: Diagnosis not present

## 2020-07-29 DIAGNOSIS — E11649 Type 2 diabetes mellitus with hypoglycemia without coma: Secondary | ICD-10-CM | POA: Diagnosis not present

## 2020-07-29 DIAGNOSIS — Z79899 Other long term (current) drug therapy: Secondary | ICD-10-CM | POA: Diagnosis not present

## 2020-07-29 DIAGNOSIS — E440 Moderate protein-calorie malnutrition: Secondary | ICD-10-CM | POA: Diagnosis not present

## 2020-07-29 DIAGNOSIS — E1159 Type 2 diabetes mellitus with other circulatory complications: Secondary | ICD-10-CM | POA: Diagnosis not present

## 2020-07-29 DIAGNOSIS — Z7401 Bed confinement status: Secondary | ICD-10-CM | POA: Diagnosis not present

## 2020-07-29 DIAGNOSIS — E1122 Type 2 diabetes mellitus with diabetic chronic kidney disease: Secondary | ICD-10-CM | POA: Diagnosis not present

## 2020-07-29 DIAGNOSIS — E162 Hypoglycemia, unspecified: Secondary | ICD-10-CM | POA: Diagnosis not present

## 2020-07-29 DIAGNOSIS — N186 End stage renal disease: Secondary | ICD-10-CM | POA: Diagnosis not present

## 2020-07-29 DIAGNOSIS — R410 Disorientation, unspecified: Secondary | ICD-10-CM | POA: Diagnosis not present

## 2020-07-29 DIAGNOSIS — E161 Other hypoglycemia: Secondary | ICD-10-CM | POA: Diagnosis not present

## 2020-07-29 DIAGNOSIS — R0902 Hypoxemia: Secondary | ICD-10-CM | POA: Diagnosis not present

## 2020-07-29 DIAGNOSIS — E1110 Type 2 diabetes mellitus with ketoacidosis without coma: Secondary | ICD-10-CM | POA: Diagnosis not present

## 2020-07-29 DIAGNOSIS — R6889 Other general symptoms and signs: Secondary | ICD-10-CM | POA: Diagnosis not present

## 2020-07-29 DIAGNOSIS — I629 Nontraumatic intracranial hemorrhage, unspecified: Secondary | ICD-10-CM | POA: Diagnosis not present

## 2020-07-29 DIAGNOSIS — R61 Generalized hyperhidrosis: Secondary | ICD-10-CM | POA: Diagnosis not present

## 2020-07-29 DIAGNOSIS — N2581 Secondary hyperparathyroidism of renal origin: Secondary | ICD-10-CM | POA: Diagnosis not present

## 2020-07-29 DIAGNOSIS — Z87891 Personal history of nicotine dependence: Secondary | ICD-10-CM | POA: Diagnosis not present

## 2020-07-29 DIAGNOSIS — I120 Hypertensive chronic kidney disease with stage 5 chronic kidney disease or end stage renal disease: Secondary | ICD-10-CM | POA: Diagnosis not present

## 2020-07-29 DIAGNOSIS — Z794 Long term (current) use of insulin: Secondary | ICD-10-CM | POA: Diagnosis not present

## 2020-07-29 DIAGNOSIS — Z743 Need for continuous supervision: Secondary | ICD-10-CM | POA: Diagnosis not present

## 2020-07-29 DIAGNOSIS — Z992 Dependence on renal dialysis: Secondary | ICD-10-CM | POA: Diagnosis not present

## 2020-07-29 DIAGNOSIS — Z7982 Long term (current) use of aspirin: Secondary | ICD-10-CM | POA: Diagnosis not present

## 2020-07-29 DIAGNOSIS — G459 Transient cerebral ischemic attack, unspecified: Secondary | ICD-10-CM | POA: Diagnosis not present

## 2020-07-29 DIAGNOSIS — R2681 Unsteadiness on feet: Secondary | ICD-10-CM | POA: Diagnosis not present

## 2020-07-29 DIAGNOSIS — R2689 Other abnormalities of gait and mobility: Secondary | ICD-10-CM | POA: Diagnosis not present

## 2020-07-29 DIAGNOSIS — I679 Cerebrovascular disease, unspecified: Secondary | ICD-10-CM | POA: Diagnosis not present

## 2020-07-29 DIAGNOSIS — E875 Hyperkalemia: Secondary | ICD-10-CM | POA: Diagnosis not present

## 2020-07-29 DIAGNOSIS — J9690 Respiratory failure, unspecified, unspecified whether with hypoxia or hypercapnia: Secondary | ICD-10-CM | POA: Diagnosis not present

## 2020-07-29 DIAGNOSIS — E1169 Type 2 diabetes mellitus with other specified complication: Secondary | ICD-10-CM | POA: Diagnosis not present

## 2020-07-29 DIAGNOSIS — R531 Weakness: Secondary | ICD-10-CM | POA: Diagnosis not present

## 2020-07-29 DIAGNOSIS — I10 Essential (primary) hypertension: Secondary | ICD-10-CM | POA: Diagnosis not present

## 2020-07-29 DIAGNOSIS — R739 Hyperglycemia, unspecified: Secondary | ICD-10-CM | POA: Diagnosis not present

## 2020-07-29 DIAGNOSIS — D638 Anemia in other chronic diseases classified elsewhere: Secondary | ICD-10-CM | POA: Diagnosis not present

## 2020-07-29 DIAGNOSIS — I152 Hypertension secondary to endocrine disorders: Secondary | ICD-10-CM | POA: Diagnosis not present

## 2020-07-29 DIAGNOSIS — R197 Diarrhea, unspecified: Secondary | ICD-10-CM | POA: Diagnosis not present

## 2020-07-29 LAB — RENAL FUNCTION PANEL
Albumin: 2.2 g/dL — ABNORMAL LOW (ref 3.5–5.0)
Anion gap: 10 (ref 5–15)
BUN: 28 mg/dL — ABNORMAL HIGH (ref 6–20)
CO2: 26 mmol/L (ref 22–32)
Calcium: 8.7 mg/dL — ABNORMAL LOW (ref 8.9–10.3)
Chloride: 100 mmol/L (ref 98–111)
Creatinine, Ser: 6.54 mg/dL — ABNORMAL HIGH (ref 0.61–1.24)
GFR, Estimated: 10 mL/min — ABNORMAL LOW (ref >60)
Glucose, Bld: 142 mg/dL — ABNORMAL HIGH (ref 70–99)
Phosphorus: 5.7 mg/dL — ABNORMAL HIGH (ref 2.5–4.6)
Potassium: 4.2 mmol/L (ref 3.5–5.1)
Sodium: 136 mmol/L (ref 135–145)

## 2020-07-29 LAB — CBC
HCT: 30.2 % — ABNORMAL LOW (ref 39.0–52.0)
Hemoglobin: 9.4 g/dL — ABNORMAL LOW (ref 13.0–17.0)
MCH: 30.5 pg (ref 26.0–34.0)
MCHC: 31.1 g/dL (ref 30.0–36.0)
MCV: 98.1 fL (ref 80.0–100.0)
Platelets: 174 10*3/uL (ref 150–400)
RBC: 3.08 MIL/uL — ABNORMAL LOW (ref 4.22–5.81)
RDW: 15.3 % (ref 11.5–15.5)
WBC: 6 10*3/uL (ref 4.0–10.5)
nRBC: 0 % (ref 0.0–0.2)

## 2020-07-29 LAB — RESP PANEL BY RT-PCR (FLU A&B, COVID) ARPGX2
Influenza A by PCR: NEGATIVE
Influenza B by PCR: NEGATIVE
SARS Coronavirus 2 by RT PCR: NEGATIVE

## 2020-07-29 LAB — GLUCOSE, CAPILLARY
Glucose-Capillary: 161 mg/dL — ABNORMAL HIGH (ref 70–99)
Glucose-Capillary: 147 mg/dL — ABNORMAL HIGH (ref 70–99)
Glucose-Capillary: 101 mg/dL — ABNORMAL HIGH (ref 70–99)
Glucose-Capillary: 130 mg/dL — ABNORMAL HIGH (ref 70–99)
Glucose-Capillary: 160 mg/dL — ABNORMAL HIGH (ref 70–99)

## 2020-07-29 MED ORDER — GABAPENTIN 100 MG PO CAPS: 100.0000 mg | ORAL_CAPSULE | Freq: Three times a day (TID) | ORAL | 0 refills | Status: DC

## 2020-07-29 MED ORDER — VANCOMYCIN HCL 750 MG/150ML IV SOLN
INTRAVENOUS | Status: AC
  Administered 2020-07-29: 750 mg
  Filled 2020-07-29: qty 150

## 2020-07-29 MED ORDER — LANTHANUM CARBONATE 500 MG PO CHEW: 500.0000 mg | CHEWABLE_TABLET | Freq: Three times a day (TID) | ORAL | 0 refills | Status: DC

## 2020-07-29 MED ORDER — RENA-VITE PO TABS: 1.0000 | ORAL_TABLET | Freq: Every day | ORAL | 0 refills | Status: DC

## 2020-07-29 MED ORDER — DARBEPOETIN ALFA 40 MCG/0.4ML IJ SOSY: 40.0000 ug | PREFILLED_SYRINGE | INTRAMUSCULAR | Status: DC

## 2020-07-29 MED ORDER — DARBEPOETIN ALFA 40 MCG/0.4ML IJ SOSY
PREFILLED_SYRINGE | INTRAMUSCULAR | Status: AC
  Administered 2020-07-29: 40 ug via INTRAVENOUS
  Filled 2020-07-29: qty 0.4

## 2020-07-29 MED ORDER — ATORVASTATIN CALCIUM 40 MG PO TABS: 40.0000 mg | ORAL_TABLET | Freq: Every day | ORAL | 0 refills | Status: DC

## 2020-07-29 NOTE — Discharge Summary (Signed)
Physician Discharge Summary  Jorge KLEINFELD [id number] DOB: 1970/11/17 DOA: 07/13/2020  PCP: Lauree Chandler, NP  Admit date: 07/13/2020 Discharge date: 07/29/2020  Admitted From: Facility Disposition: Same  Recommendations for Outpatient Follow-up:  Follow up with PCP in 1-2 weeks Please obtain BMP/CBC in one week Please follow up with nephrology as scheduled  Discharge Condition: Stable CODE STATUS: Full Diet recommendation: Renal dialysis diet  Brief/Interim Summary: Jorge [name] is a 50 y.o. male with PMHx significant for HTN, T2DM, ESRD (on HD MWF), bilateral BKA, ICH, TIA admitted 07/13/2020 with AMS and volume overload, patient presented from dialysis after 1 hour he became confused pulling at lines somewhat combative noted to be hypertensive and hypoxic with worsening mental status in the ED upon presentation on 07/13/2020.  Initially admitted to the floor with worsening mental status deterioration concern for airway protection and was intubated on 07/14/2020.  Patient self extubated on 07/16/2020, stabilizing over the past week.  Patient noted to have gaze preference with a GCS of 8 and unilateral weakness code stroke was called -CT head showed small subacute infarcts previously documented -PCCM reconsulted on the 11th to reintubate the patient given worsening clinical picture and airway compromise.  TEE on the 14th showed a small PFO, concern for HCAP versus aspiration pneumonia in the interim vancomycin and Zosyn initiated 07/23/2020-de-escalate to vancomycin only given sputum cultures positive for MRSA as of 07/26/2020.  Acute metabolic encephalopathy, multifactorial, improving Multiple bilateral CVA, acute, likely contributing  - Continue aspirin, statin - MRI shows new acute infarct left centrum semiovale and evolving small areas of infarction on the prior study.evolving small areas of infarction on the prior study - concerning for recrudescence of old strokes on top of apparent new  stroke likely secondary to hypertensive emergency as below as well as profound hypoxia  -Approaching baseline per sister over the phone   Acute hypoxemic respiratory failure secondary to encephalopathy, resolved Suspected right lower lobe HCAP versus aspiration -Successfully extubated on 07/24/2020 currently on room air -Completed vancomycin only given MRSA positive sputum cultures -Concern for aspiration in house given requirement for reintubation and mental status changes as outlined above in the setting of acute stroke   Septic shock, POA, resolved Right lower lobe suspected aspiration pneumonia, POA Recurrent aspiration event with worsening right lower lobe infiltrate Bacteremia ruled out - Culture 7/6 staph hominis, then repeat culture with staph auricularis in 1/4.  Likely contaminant, central line removed 7/15 and TEE reassuring. 7/16 respiratory culture growing staph aureus -Completed all antibiotics - Off pressors since 07/24/2020   History of hypertension -Home blood pressure medications held in the setting of shock, patient has been normotensive over the past few days of home medications - Continue to follow -may need to readjust home medications after discharge pending repeat vitals per facility   ESRD on iHD -Appreciate nephrology management, intermittent HD MWF, now at dry weight -Continue Fosrenol, patient's Renvela has been discontinued since admission   T2DM with hyperglycemia - now hypoglycemia due to poor PO intake -Lantus discontinued -Continue home sliding scale -Dysphagia resolved, NG tube removed   Discharge Diagnoses:  Principal Problem:   AMS (altered mental status) Active Problems:   ESRD (end stage renal disease) on dialysis ([place])   Hypertension associated with diabetes ([place])   Type 2 diabetes mellitus with chronic kidney disease on chronic dialysis, with long-term current use of insulin ([place])   Anemia of chronic disease   Acute encephalopathy    Hyperlipidemia associated with type 2 diabetes  mellitus ([place])   Hypertensive urgency   Malnutrition of moderate degree    Discharge Instructions  Discharge Instructions     Call MD for:  extreme fatigue   Complete by: As directed    Call MD for:  severe uncontrolled pain   Complete by: As directed    Call MD for:  temperature >100.4   Complete by: As directed    Diet general   Complete by: As directed    Renal dialysis diet   Increase activity slowly   Complete by: As directed    No wound care   Complete by: As directed       Allergies as of 07/29/2020       Reactions   Lactose Intolerance (gi) Diarrhea, Nausea Only        Medication List     STOP taking these medications    calcitRIOL 0.5 MCG capsule Commonly known as: ROCALTROL   carvedilol 12.5 MG tablet Commonly known as: COREG   insulin glargine 100 UNIT/ML injection Commonly known as: LANTUS   sevelamer carbonate 800 MG tablet Commonly known as: RENVELA       TAKE these medications    aspirin EC 81 MG tablet Take 81 mg by mouth daily.   atorvastatin 40 MG tablet Commonly known as: LIPITOR Take 1 tablet (40 mg total) by mouth daily. Start taking on: July 30, 2020 What changed:  medication strength how much to take   Ensure Take 237 mLs by mouth 2 (two) times daily between meals.   gabapentin 100 MG capsule Commonly known as: NEURONTIN Take 1 capsule (100 mg total) by mouth 3 (three) times daily. What changed:  medication strength how much to take when to take this reasons to take this   glucagon 1 MG Solr injection Commonly known as: GLUCAGEN Inject 1 mg into the vein once as needed for low blood sugar.   HumaLOG KwikPen 100 UNIT/ML KwikPen Generic drug: insulin lispro Inject 0-10 Units into the skin See admin instructions. Per sliding scale 3 times daily. 0-150= 0 units 151-200= 2 units 201-250= 4 units 251-300= 6 units 301-350= 8 units 351-400= 10 units <70 or >400 call  PCP   lanthanum 500 MG chewable tablet Commonly known as: FOSRENOL Chew 1 tablet (500 mg total) by mouth 3 (three) times daily with meals.   multivitamin Tabs tablet Take 1 tablet by mouth at bedtime.   promethazine 25 MG tablet Commonly known as: PHENERGAN Take 25 mg by mouth every 6 (six) hours as needed for nausea or vomiting.   sertraline 25 MG tablet Commonly known as: ZOLOFT Take 25 mg by mouth daily.   Vitamin D (Ergocalciferol) 1.25 MG (50000 UNIT) Caps capsule Commonly known as: DRISDOL Take 50,000 Units by mouth every Saturday.        Follow-up Information     Icard, Bradley L, DO. Schedule an appointment as soon as possible for a visit.   Specialty: Pulmonary Disease Contact information: [address] [place] [address] [phone]                Allergies  Allergen Reactions   Lactose Intolerance (Gi) Diarrhea and Nausea Only    Consultations: PCCM, nephrology  Procedures/Studies: CT Head Wo Contrast  Result Date: 07/13/2020 CLINICAL DATA:  Delirium. EXAM: CT HEAD WITHOUT CONTRAST TECHNIQUE: Contiguous axial images were obtained from the base of the skull through the vertex without intravenous contrast. COMPARISON:  Head CT and brain MRI May 09, 2020 and May 10, 2020 FINDINGS: Non traditional scan plane limits detailed assessment. There is also streak and motion artifact. Skull vertex is excluded from the field of view. Brain: No evidence of hemorrhage allowing for limitations. Stable atrophy. Periventricular chronic small vessel ischemia. No hydrocephalus, midline shift, or mass effect. Remote cerebellar infarcts on prior MRI are not well defined on the current exam. No obvious acute ischemia, with evaluation limited by artifact. No subdural or extra-axial collection. Vascular: Atherosclerosis of skullbase vasculature without hyperdense vessel or abnormal calcification. Skull: No fracture or focal lesion. Sinuses/Orbits: Paranasal sinuses  and mastoid air cells are clear. The visualized orbits are unremarkable. Other: None. IMPRESSION: 1. Technically limited exam as described. 2. Allowing for limitations, no acute intracranial abnormality. 3. Stable atrophy. Electronically Signed   By: Keith Rake M.D.   On: 07/13/2020 16:59   CT CHEST WO CONTRAST  Result Date: 07/14/2020 CLINICAL DATA:  Mediastinal mass. EXAM: CT CHEST WITHOUT CONTRAST TECHNIQUE: Multidetector CT imaging of the chest was performed following the standard protocol without IV contrast. COMPARISON:  Radiographs of same day. FINDINGS: Cardiovascular: Atherosclerosis of thoracic aorta is noted without aneurysm formation. Normal cardiac size. No pericardial effusion. Coronary artery calcifications are noted. Left internal jugular catheter is seen directed in retrograde manner into right subclavian vein. Mediastinum/Nodes: Thyroid gland is unremarkable. Endotracheal tube is in grossly good position. Nasogastric tube is seen passing through esophagus and into stomach. There does appear to be right paratracheal and precarinal adenopathy, although evaluation is limited due the lack of intravenous contrast. Largest right paratracheal lymph node measures 16 mm. Precarinal lymph node measures 2 cm. Lungs/Pleura: No pneumothorax is noted. Minimal left posterior basilar subsegmental atelectasis is noted. Large right lower lobe airspace opacity is noted with air bronchograms most consistent with pneumonia. Upper Abdomen: No acute abnormality. Musculoskeletal: No chest wall mass or suspicious bone lesions identified. IMPRESSION: Mildly enlarged right paratracheal and precarinal adenopathy is noted with largest lymph node measuring 2 cm in precarinal region. This may be infectious or inflammatory in etiology, but malignancy or metastatic disease cannot be excluded. Evaluation is limited due to lack of intravenous contrast. Large right lower lobe airspace opacity is noted with air bronchograms  concerning for pneumonia, potentially aspiration pneumonia. Coronary artery calcifications are noted consistent with coronary artery disease. Aortic Atherosclerosis (ICD10-I70.0). Electronically Signed   By: Marijo Conception M.D.   On: 07/14/2020 13:19   MR BRAIN WO CONTRAST  Result Date: 07/19/2020 CLINICAL DATA:  Gaze preference EXAM: MRI HEAD WITHOUT CONTRAST TECHNIQUE: Multiplanar, multiecho pulse sequences of the brain and surrounding structures were obtained without intravenous contrast. COMPARISON:  07/14/2020 FINDINGS: Brain: A few small foci of diffusion hyperintensity are again identified. These were previously present. There is a new focus in the left centrum semiovale. Other foci on the prior study are less apparent. Chronic hemorrhagic infarction involving right subinsular region and adjacent operculum. Foci of chronic microhemorrhage are again identified in the left frontal subcortical white matter and posterior left putamen. Small chronic left frontal cortical/subcortical infarct. Additional patchy foci of T2 hyperintensity in the supratentorial white matter are nonspecific but probably reflect stable chronic microvascular ischemic changes. Multiple chronic infarcts of the cerebellum bilaterally, parasagittal right pons, and deep cerebral white matter. There is no hydrocephalus or extra-axial fluid collection. Vascular: Diminished vertebrobasilar flow voids, noting large bilateral posterior communicating arteries. Skull and upper cervical spine: Normal marrow signal is preserved. Sinuses/Orbits: Paranasal sinuses are aerated. Orbits are unremarkable. Other: Sella is unremarkable.  Mastoid  air cells are clear. IMPRESSION: New acute infarct left centrum semiovale. Other evolving small areas of infarction on the prior study. Stable chronic/nonemergent findings detailed above. Electronically Signed   By: Macy Mis M.D.   On: 07/19/2020 17:16   MR BRAIN WO CONTRAST  Result Date:  07/14/2020 CLINICAL DATA:  Headache and papilledema. End stage renal disease on dialysis. EXAM: MRI HEAD WITHOUT CONTRAST TECHNIQUE: Multiplanar, multiecho pulse sequences of the brain and surrounding structures were obtained without intravenous contrast. COMPARISON:  Head CT yesterday.  MRI 05/10/2020. FINDINGS: Brain: Diffusion imaging shows a 7 mm acute infarction within the left cerebellum centrally. Punctate acute infarction in the right inferior posterior parietal white matter. Two small linear acute infarctions within the left frontal white matter. No large vessel territory acute infarction. Chronic small-vessel ischemic changes affect the pons. Several old small vessel cerebellar infarctions. Cerebral hemispheres show moderate chronic small-vessel disease of the deep white matter, old hemorrhagic infarction in the right external capsule region, an old small cortical and subcortical left frontal infarction. No evidence of mass, hydrocephalus or extra-axial collection. Vascular: Major vessels at the base of the brain show flow. Skull and upper cervical spine: Negative. Marrow changes likely related to chronic hemodialysis. Sinuses/Orbits: Clear/normal Other: None IMPRESSION: 4 small acute infarctions, left cerebellar white matter, right posteroinferior parietal white matter and 2 in the left frontal white matter. Findings could be due to embolic disease or concurrent ordinary small-vessel infarctions. Atrophy and extensive chronic small-vessel ischemic changes elsewhere. Old hemorrhagic infarction in the right external capsule. Old left frontal cortical and subcortical infarction. No abnormality seen to explain papilledema. No evidence of mass lesion or acute hemorrhage. Electronically Signed   By: Nelson Chimes M.D.   On: 07/14/2020 12:53   DG Chest Port 1 View  Result Date: 07/24/2020 CLINICAL DATA:  Reason for exam pneumonia EXAM: PORTABLE CHEST 1 VIEW COMPARISON:  07/23/2020 FINDINGS: Endotracheal tube  and feeding tube unchanged. Stable cardiac silhouette. Mild improvement in RIGHT lower lobe opacity. No pleural fluid. No pneumothorax. IMPRESSION: 1. Stable support apparatus. 2. Improved RIGHT lower lobe atelectasis/infiltrate Electronically Signed   By: Suzy Bouchard M.D.   On: 07/24/2020 08:13   DG Chest Port 1 View  Result Date: 07/23/2020 CLINICAL DATA:  Respirator dependent. EXAM: PORTABLE CHEST 1 VIEW COMPARISON:  July 18, 2020 FINDINGS: Endotracheal tube and enteric catheter in satisfactory position, where visualized. Cardiomediastinal silhouette is normal. Mediastinal contours appear intact. Right lower lung field airspace consolidation, more prominent radiographically than on July 18, 2020. No evidence of pleural effusion or pneumothorax. Osseous structures are without acute abnormality. Soft tissues are grossly normal. IMPRESSION: Right lower lung field airspace consolidation, more prominent radiographically than on July 18, 2020. Electronically Signed   By: Fidela Salisbury M.D.   On: 07/23/2020 12:46   DG Chest Port 1 View  Result Date: 07/18/2020 CLINICAL DATA:  Intubation EXAM: PORTABLE CHEST 1 VIEW COMPARISON:  07/18/2020 FINDINGS: Endotracheal tube is 5 cm above the carina. Heart is normal size. No confluent airspace opacities or effusions. No acute bony abnormality. IMPRESSION: Endotracheal tube 5 cm above the carina. No acute cardiopulmonary disease. Electronically Signed   By: Rolm Baptise M.D.   On: 07/18/2020 11:02   DG CHEST PORT 1 VIEW  Result Date: 07/18/2020 CLINICAL DATA:  Pneumonia EXAM: PORTABLE CHEST 1 VIEW COMPARISON:  07/14/2020 chest radiograph. FINDINGS: Enteric tube enters stomach with the tip not seen on this image. Stable cardiomediastinal silhouette with normal heart size. No pneumothorax. No pleural  effusion. Lungs appear clear, with no acute consolidative airspace disease and no pulmonary edema. IMPRESSION: No active disease. Enteric tube enters stomach with  the tip not seen on this image. Electronically Signed   By: Ilona Sorrel M.D.   On: 07/18/2020 09:15   DG CHEST PORT 1 VIEW  Result Date: 07/14/2020 CLINICAL DATA:  Orogastric tube placement. EXAM: PORTABLE CHEST 1 VIEW COMPARISON:  Same day. FINDINGS: Stable cardiac size. Endotracheal tube is in grossly good position. Nasogastric tube tip is seen in expected position of distal stomach. Left internal jugular catheter is noted with tip directed into the right subclavian vein. No pneumothorax or pleural effusion is noted. IMPRESSION: Endotracheal and nasogastric tubes are in grossly good position. Left internal jugular catheter is noted with tip directed into the right subclavian vein; repositioning is recommended. These results will be called to the ordering clinician or representative by the Radiologist Assistant, and communication documented in the PACS or [employer] Dashboard. Electronically Signed   By: Marijo Conception M.D.   On: 07/14/2020 10:30   DG CHEST PORT 1 VIEW  Result Date: 07/14/2020 CLINICAL DATA:  Altered mental status. EXAM: PORTABLE CHEST 1 VIEW COMPARISON:  07/13/2020.  03/08/2020.  CT neck 10/04/2019. FINDINGS: Persistent right upper mediastinal soft tissue fullness. Neck CT of 10/04/2019 revealed mediastinal lymph nodes. Contrast-enhanced CT of the chest should be considered for further evaluation. Low lung volumes. Mild right base infiltrate cannot be excluded. No pleural effusion or pneumothorax. Heart size normal. No acute bony abnormality. IMPRESSION: 1. Persistent right upper mediastinal soft tissue fullness suggesting a mediastinal mass. Neck CT of 10/04/2019 revealed mediastinal lymph nodes. Contrast-enhanced CT of the chest should be considered for further evaluation. 2. Low lung volumes. Mild right base infiltrate cannot be excluded. Electronically Signed   By: Marcello Moores  Register   On: 07/14/2020 07:03   DG Chest Port 1 View  Result Date: 07/13/2020 CLINICAL DATA:  Sudden onset of  altered mental status during hemodialysis today. EXAM: PORTABLE CHEST 1 VIEW COMPARISON:  Radiographs 03/08/2020 and 10/03/2019. Neck CTA 10/04/2019. FINDINGS: 1649 hours. Mild patient rotation to the right. There is stable cardiomegaly. There is mildly increased right paratracheal soft tissue prominence which may relate to the rotation. On the prior CTA, there were prominent lymph nodes and fat in the right paratracheal region. The lungs appear clear. There is no pleural effusion or pneumothorax. No acute osseous findings are evident. IMPRESSION: No definite acute cardiopulmonary process. Increased soft tissue fullness in the right paratracheal region may relate to patient rotation. Attention on follow-up recommended; if persistent, follow-up CT warranted. Electronically Signed   By: Richardean Sale M.D.   On: 07/13/2020 17:24   DG Abd Portable 1V  Result Date: 07/21/2020 CLINICAL DATA:  Status post feeding catheter placement EXAM: PORTABLE ABDOMEN - 1 VIEW COMPARISON:  None. FINDINGS: Weighted feeding catheter is noted in the distal aspect of the stomach. Vascular calcifications are seen. No free air is noted. IMPRESSION: Weighted feeding catheter in the distal stomach. Electronically Signed   By: Inez Catalina M.D.   On: 07/21/2020 18:10   DG Abd Portable 1V  Result Date: 07/15/2020 CLINICAL DATA:  Encounter for feeding tube placement. EXAM: PORTABLE ABDOMEN - 1 VIEW COMPARISON:  Chest CT of July 14, 2020. FINDINGS: Lung bases are clear. Feeding tube terminates in the antro pyloric stomach. No abnormally dilated loops of bowel in the upper abdomen. Signs of vascular calcifications along splenic artery distribution. On limited assessment no acute regional skeletal process.  IMPRESSION: Feeding tube terminates in the antro pyloric stomach. These results will be called to the ordering clinician or representative by the Radiologist Assistant, and communication documented in the PACS or [employer].  Electronically Signed   By: Zetta Bills M.D.   On: 07/15/2020 13:16   EEG adult  Result Date: 07/15/2020 Lora Havens, MD     07/15/2020  9:00 AM Patient Name: AVIGDOR WROBLE MRN: [id number] Epilepsy Attending: Lora Havens Referring Physician/Provider: Dr Ina Homes Date: 07/14/2020 Duration: 25.37 mins Patient history: 50 year old male with altered mental status.  EEG evaluate for seizures. Level of alertness: comatose AEDs during EEG study: Propofol Technical aspects: This EEG study was done with scalp electrodes positioned according to the 10-20 International system of electrode placement. Electrical activity was acquired at a sampling rate of '500Hz'$  and reviewed with a high frequency filter of '70Hz'$  and a low frequency filter of '1Hz'$ . EEG data were recorded continuously and digitally stored. Description: EEG showed continuous generalized 3 to 5 Hz theta and delta slowing admixed with 15 to 18 Hz frontocentral beta activity.  Hyperventilation and photic stimulation were not performed.   ABNORMALITY - Continuous slow, generalized IMPRESSION: This study is suggestive of severe diffuse encephalopathy, nonspecific etiology but likely related to sedation. No seizures or definite epileptiform discharges were seen throughout the recording. Priyanka [name]   Overnight EEG with video  Result Date: 07/19/2020 Lora Havens, MD     07/20/2020  8:14 AM Patient Name: GAELEN RAYAS MRN: [id number] Epilepsy Attending: Lora Havens Referring Physician/Provider: Dr Lesleigh Noe Duration: 27/11/2020 1122 to 07/19/2020 1257  Patient history: 50 year old male with altered mental status.  EEG evaluate for seizures.  Level of alertness: comatose  AEDs during EEG study: None  Technical aspects: This EEG study was done with scalp electrodes positioned according to the 10-20 International system of electrode placement. Electrical activity was acquired at a sampling rate of '500Hz'$  and reviewed with a high frequency  filter of '70Hz'$  and a low frequency filter of '1Hz'$ . EEG data were recorded continuously and digitally stored.  Description: EEG showed continuous generalized and lateralized left hemisphere 3 to 5 Hz high amplitude theta-delta slowing with triphasic morphology which at times appeared sharply contoured and rhythmic. Bilateral frontocentral 15 to 18 Hz beta activity was also noted. Hyperventilation and photic stimulation were not performed.    ABNORMALITY - Continuous slow, generalized and lateralized left hemisphere  IMPRESSION: This study is suggestive of cortical dysfunction in left hemisphere likely secondary to underlying structural abnormality. There is also severe diffuse encephalopathy, nonspecific etiology. No seizures or definite epileptiform discharges were seen throughout the recording.  Lora Havens   ECHOCARDIOGRAM COMPLETE  Result Date: 07/15/2020    ECHOCARDIOGRAM REPORT   Patient Name:   CAYDENCE FROMM Date of Exam: 07/15/2020 Medical Rec #:  [id number]      Height:       71.0 in Accession #:    [id number]     Weight:       141.1 lb Date of Birth:  October 23, 1970      BSA:          1.818 m Patient Age:    58 years       BP:           116/60 mmHg Patient Gender: M              HR:           69 bpm.  Exam Location:  Inpatient Procedure: 2D Echo, Cardiac Doppler and Color Doppler Indications:    Endocarditis  History:        Patient has prior history of Echocardiogram examinations, most                 recent 05/10/2020. TIA; Risk Factors:Hypertension, Diabetes and                 Dyslipidemia.  Sonographer:    Luisa Hart RDCS Referring Phys: [id number] Candee Furbish  Sonographer Comments: Suboptimal parasternal window, suboptimal apical window, suboptimal subcostal window and echo performed with patient supine and on artificial respirator. IMPRESSIONS  1. Left ventricular ejection fraction, by estimation, is 60 to 65%. The left ventricle has normal function. The left ventricle has no regional wall motion  abnormalities. There is mild left ventricular hypertrophy. Left ventricular diastolic parameters are indeterminate.  2. Right ventricular systolic function is normal. The right ventricular size is normal. Tricuspid regurgitation signal is inadequate for assessing PA pressure.  3. The mitral valve is normal in structure. Trivial mitral valve regurgitation.  4. The aortic valve was not well visualized. Aortic valve regurgitation is not visualized. No aortic stenosis is present.  5. Aortic dilatation noted. There is dilatation of the ascending aorta, measuring 41 mm. There is dilatation of the aortic root, measuring 40 mm.  6. The inferior vena cava is dilated in size with >50% respiratory variability, suggesting right atrial pressure of 8 mmHg. Conclusion(s)/Recommendation(s): No intracardiac source of embolism detected on this transthoracic study. A transesophageal echocardiogram is recommended to exclude cardiac source of embolism if clinically indicated. FINDINGS  Left Ventricle: Left ventricular ejection fraction, by estimation, is 60 to 65%. The left ventricle has normal function. The left ventricle has no regional wall motion abnormalities. The left ventricular internal cavity size was normal in size. There is  mild left ventricular hypertrophy. Left ventricular diastolic parameters are indeterminate. Right Ventricle: The right ventricular size is normal. No increase in right ventricular wall thickness. Right ventricular systolic function is normal. Tricuspid regurgitation signal is inadequate for assessing PA pressure. Left Atrium: Left atrial size was normal in size. Right Atrium: Right atrial size was normal in size. Pericardium: There is no evidence of pericardial effusion. Mitral Valve: The mitral valve is normal in structure. Trivial mitral valve regurgitation. Tricuspid Valve: The tricuspid valve is grossly normal. Tricuspid valve regurgitation is not demonstrated. Aortic Valve: The aortic valve was not  well visualized. Aortic valve regurgitation is not visualized. No aortic stenosis is present. Aortic valve mean gradient measures 2.0 mmHg. Aortic valve peak gradient measures 3.6 mmHg. Aortic valve area, by VTI measures 5.78 cm. Pulmonic Valve: The pulmonic valve was not well visualized. Pulmonic valve regurgitation not well visualized. Aorta: Aortic dilatation noted. There is dilatation of the ascending aorta, measuring 41 mm. There is dilatation of the aortic root, measuring 40 mm. Venous: The inferior vena cava is dilated in size with greater than 50% respiratory variability, suggesting right atrial pressure of 8 mmHg. IAS/Shunts: No atrial level shunt detected by color flow Doppler.  LEFT VENTRICLE PLAX 2D LVIDd:         4.40 cm     Diastology LVIDs:         2.90 cm     LV e' medial:    4.56 cm/s LV PW:         1.20 cm     LV E/e' medial:  13.2 LV IVS:  0.90 cm     LV e' lateral:   6.05 cm/s LVOT diam:     2.50 cm     LV E/e' lateral: 10.0 LV SV:         88 LV SV Index:   48 LVOT Area:     4.91 cm  LV Volumes (MOD) LV vol d, MOD A2C: 50.3 ml LV vol d, MOD A4C: 54.3 ml LV vol s, MOD A2C: 17.5 ml LV vol s, MOD A4C: 14.1 ml LV SV MOD A2C:     32.8 ml LV SV MOD A4C:     54.3 ml LV SV MOD BP:      36.6 ml RIGHT VENTRICLE RV Basal diam:  2.10 cm    PULMONARY VEINS RV Mid diam:    1.50 cm    A Reversal Duration: 109.00 msec RV S prime:     9.02 cm/s  A Reversal Velocity: 24.80 cm/s TAPSE (M-mode): 1.4 cm     Diastolic Velocity:  Q000111Q cm/s                            S/D Velocity:        1.00                            Systolic Velocity:   123456 cm/s LEFT ATRIUM           Index       RIGHT ATRIUM           Index LA diam:      3.00 cm 1.65 cm/m  RA Area:     11.80 cm LA Vol (A2C): 26.4 ml 14.52 ml/m RA Volume:   23.80 ml  13.09 ml/m LA Vol (A4C): 31.0 ml 17.05 ml/m  AORTIC VALVE AV Area (Vmax):    4.66 cm AV Area (Vmean):   4.69 cm AV Area (VTI):     5.78 cm AV Vmax:           95.40 cm/s AV Vmean:           62.200 cm/s AV VTI:            0.152 m AV Peak Grad:      3.6 mmHg AV Mean Grad:      2.0 mmHg LVOT Vmax:         90.50 cm/s LVOT Vmean:        59.400 cm/s LVOT VTI:          0.179 m LVOT/AV VTI ratio: 1.18  AORTA Ao Root diam: 4.10 cm Ao Asc diam:  4.00 cm MITRAL VALVE MV Area (PHT): 3.37 cm    SHUNTS MV Decel Time: 225 msec    Systemic VTI:  0.18 m MV E velocity: 60.40 cm/s  Systemic Diam: 2.50 cm MV A velocity: 60.40 cm/s MV E/A ratio:  1.00 Oswaldo Milian MD Electronically signed by Oswaldo Milian MD Signature Date/Time: 07/15/2020/4:48:00 PM    Final    ECHO TEE  Result Date: 07/21/2020    TRANSESOPHOGEAL ECHO REPORT   Patient Name:   AGRON NICHOLES Date of Exam: 07/21/2020 Medical Rec #:  [id number]      Height:       71.0 in Accession #:    [id number]     Weight:       143.1 lb Date of Birth:  1970/05/10      BSA:  1.829 m Patient Age:    61 years       BP:           123/59 mmHg Patient Gender: M              HR:           81 bpm. Exam Location:  Inpatient Procedure: 3D Echo, Transesophageal Echo, Cardiac Doppler and Color Doppler Indications:     Stroke  History:         Patient has prior history of Echocardiogram examinations, most                  recent 07/15/2020. Risk Factors:Hypertension and Diabetes. End                  stage renal disease, anemia.  Sonographer:     Darlina Sicilian RDCS Referring Phys:  [phone] Lanier Clam Diagnosing Phys: Rudean Haskell MD PROCEDURE: After discussion of the risks and benefits of a TEE, an informed consent was obtained from a family member. The patient was intubated. TEE procedure time was 27 minutes. The transesophogeal probe was passed without difficulty through the esophogus of the patient. Imaged were obtained with the patient in a supine position. Sedation performed by performing physician. Patients was under conscious sedation during this procedure. Anesthetic administered: 45mg of Fentanyl, '3mg'$  of Versed. Image quality was  good. The patient developed no complications during the procedure. IMPRESSIONS  1. Evidence of atrial level shunting detected by color flow Doppler. Agitated saline contrast bubble study was positive with shunting observed within 3-6 cardiac cycles suggestive of interatrial shunt. There is a very small patent foramen ovale with predominantly left to right shunting across the atrial septum best seen in 3D glass resconstruction.  2. Normal PV flow; SVC position, normal CS position.  3. Left ventricular ejection fraction, by estimation, is 60 to 65%. The left ventricle has normal function. The left ventricle has no regional wall motion abnormalities. There is severe concentric left ventricular hypertrophy. LV appears to be underfilled; there is no evidence of systolic anterior motion of the mitral valve.  4. There is Moderate (Grade III) plaque involving the descending aorta.  5. Right ventricular systolic function is normal. The right ventricular size is normal.  6. No left atrial/left atrial appendage thrombus was detected.  7. The mitral valve is normal in structure. No evidence of mitral valve regurgitation.  8. The aortic valve is tricuspid. Aortic valve regurgitation is not visualized. No aortic stenosis is present. FINDINGS  Left Ventricle: Left ventricular ejection fraction, by estimation, is 60 to 65%. The left ventricle has normal function. The left ventricle has no regional wall motion abnormalities. The left ventricular internal cavity size was small. There is severe concentric left ventricular hypertrophy. Right Ventricle: The right ventricular size is normal. Right vetricular wall thickness was not assessed. Right ventricular systolic function is normal. Left Atrium: Left atrial size was normal in size. No left atrial/left atrial appendage thrombus was detected. Right Atrium: Right atrial size was not assessed. Prominent Chiari network. Pericardium: There is no evidence of pericardial effusion. Mitral  Valve: The mitral valve is normal in structure. No evidence of mitral valve regurgitation. Tricuspid Valve: The tricuspid valve is grossly normal. Tricuspid valve regurgitation is not demonstrated. Aortic Valve: The aortic valve is tricuspid. Aortic valve regurgitation is not visualized. No aortic stenosis is present. Pulmonic Valve: Normal PV flow; SVC position, normal CS position. The pulmonic valve was grossly normal. Pulmonic valve  regurgitation is trivial. Aorta: The aortic root is normal in size and structure. There is moderate (Grade III) plaque involving the descending aorta. IAS/Shunts: Evidence of atrial level shunting detected by color flow Doppler. Agitated saline contrast was given intravenously to evaluate for intracardiac shunting. Agitated saline contrast bubble study was positive with shunting observed within 3-6 cardiac cycles suggestive of interatrial shunt. A small patent foramen ovale is detected with predominantly left to right shunting across the atrial septum. Rudean Haskell MD Electronically signed by Rudean Haskell MD Signature Date/Time: 07/21/2020/3:11:49 PM    Final    CT HEAD CODE STROKE WO CONTRAST`  Result Date: 07/18/2020 CLINICAL DATA:  Code stroke.  Stroke, follow-up. EXAM: CT HEAD WITHOUT CONTRAST TECHNIQUE: Contiguous axial images were obtained from the base of the skull through the vertex without intravenous contrast. COMPARISON:  Brain MRI 07/14/2020.  Head CT 07/13/2020. FINDINGS: Brain: Mild generalized cerebral and cerebellar atrophy. Four known small subacute supratentorial and infratentorial infarcts were better appreciated on the brain MRI of 07/14/2020. Redemonstrated small chronic cortical/subcortical infarct within the anterior left frontal lobe. Moderate patchy and ill-defined hypoattenuation within the cerebral white matter, nonspecific but compatible with chronic small vessel ischemic disease. Known chronic lacunar infarcts within the bilateral  thalami. Redemonstrated hyperdensity within the right external capsule representing chronic blood products from prior hemorrhagic infarct at this site. Multiple known small chronic infarcts within the bilateral cerebellar hemispheres. There is no acute intracranial hemorrhage. No interval acute demarcated cortical infarct. No extra-axial fluid collection. No evidence of an intracranial mass. No midline shift. Vascular: No hyperdense vessel.  Atherosclerotic calcifications. Skull: Normal. Negative for fracture or focal lesion. Sinuses/Orbits: Leftward gaze. Partially imaged nasoenteric tube. Mild mucosal thickening within right sphenoid sinus. ASPECTS [facility] Stroke Program Early CT Score) - Ganglionic level infarction (caudate, lentiform nuclei, internal capsule, insula, M1-M3 cortex): 7 - Supraganglionic infarction (M4-M6 cortex): 3 Total score (0-10 with 10 being normal): 10 (when discounting known subacute and chronic infarcts). These results were communicated to Dr. Curly Shores At 9:50 Harleigh 7/11/2022by text page via the [facility] messaging system. IMPRESSION: No evidence of interval acute intracranial abnormality. Four known small supratentorial and infratentorial subacute infarcts were better appreciated on the brain MRI of 07/14/2020. Redemonstrated chronic/stable findings, as described. Electronically Signed   By: Kellie Simmering DO   On: 07/18/2020 09:51   CT ANGIO HEAD NECK W WO CM W PERF (CODE STROKE)  Result Date: 07/18/2020 CLINICAL DATA:  Neuro deficit, acute, stroke suspected. EXAM: CT ANGIOGRAPHY HEAD AND NECK TECHNIQUE: Multidetector CT imaging of the head and neck was performed using the standard protocol during bolus administration of intravenous contrast. Multiplanar CT image reconstructions and MIPs were obtained to evaluate the vascular anatomy. Carotid stenosis measurements (when applicable) are obtained utilizing NASCET criteria, using the distal internal carotid diameter as the denominator.  CONTRAST:  37m OMNIPAQUE IOHEXOL 350 MG/ML SOLN COMPARISON:  Noncontrast head CT performed earlier today 07/18/2020. Chest CT 07/14/2020. Brain MRI 07/14/2020. CT angiogram head/neck 10/04/2019. FINDINGS: CTA NECK FINDINGS The examination is moderately motion degraded, limiting evaluation. Aortic arch: Standard aortic branching atherosclerotic plaque within the visualized aortic arch and proximal major branch vessels of the neck. No hemodynamically significant innominate or proximal subclavian artery stenosis. Right carotid system: CCA and ICA patent within the neck. Soft and calcified plaque within the mid to distal CCA, carotid bifurcation and proximal ICA without appreciable hemodynamically significant stenosis (50% or greater). Left carotid system: CCA and ICA patent within the neck. Soft and calcified plaque within  mid distal CCA, carotid bifurcation and proximal ICA. No appreciable hemodynamically significant stenosis (50% or greater). Vertebral arteries: Vertebral arteries codominant patent within the neck without appreciable stenosis. Skeleton: No acute bony abnormality or aggressive osseous lesion. Other neck: No neck mass or cervical lymphadenopathy. Upper chest: No consolidation within the imaged lung apices. Redemonstrated but partially visualized right paratracheal lymphadenopathy. Please refer to the prior chest CT of 07/14/2020 for further description. Partially visualized enteric tube. Review of the MIP images confirms the above findings CTA HEAD FINDINGS There is venous contamination in the examination is mildly motion degraded, limiting evaluation. Anterior circulation: The intracranial internal carotid arteries are patent. Calcified plaque within both vessels without significant stenosis. The M1 middle cerebral arteries are patent. No M2 proximal branch occlusion or high-grade proximal stenosis is identified. The anterior cerebral arteries are patent. Partially azygos configuration of the A2 and  more distal anterior cerebral arteries. No intracranial aneurysm is identified. Posterior circulation: The intracranial vertebral arteries are patent. The intracranial right vertebral artery is developmentally diminutive beyond the origin of the right PICA. The basilar artery is patent. The posterior cerebral arteries are patent. Fetal origin left posterior cerebral artery. A sizable right posterior communicating artery is also present. Atherosclerotic irregularity of both posterior cerebral arteries without high-grade proximal stenosis identified. Venous sinuses: Within the limitations of contrast timing, no convincing thrombus. Anatomic variants: As described Review of the MIP images confirms the above findings No emergent large vessel occlusion identified. These results were called by telephone at the time of interpretation on 07/18/2020 at 10:10 am to provider [facility] , who verbally acknowledged these results. IMPRESSION: CTA neck: 1. Moderately motion degraded examination. 2. The common carotid and internal carotid arteries are patent within the neck with atherosclerotic disease, but without appreciable hemodynamically significant stenosis (50% or greater). 3. Vertebral arteries codominant patent within the neck without appreciable stenosis. CTA head: Intracranial sclerotic disease, as described. No intracranial large vessel occlusion or proximal high-grade arterial stenosis identified. Electronically Signed   By: Kellie Simmering DO   On: 07/18/2020 10:39     Subjective: No acute issues or events overnight denies nausea vomiting diarrhea constipation headache fevers or chills   Discharge Exam: Vitals:   07/29/20 1130 07/29/20 1224  BP: (!) 155/50 132/81  Pulse: 68 64  Resp: 16 16  Temp:  98.3 F (36.8 C)  SpO2:  94%   Vitals:   07/29/20 1030 07/29/20 1100 07/29/20 1130 07/29/20 1224  BP: (!) 119/23 (!) 137/59 (!) 155/50 132/81  Pulse: 69 67 68 64  Resp: '10 12 16 16  '$ Temp:    98.3 F  (36.8 C)  TempSrc:    Oral  SpO2:    94%  Weight:      Height:       General:  Pleasantly resting in bed, No acute distress. HEENT:  Normocephalic atraumatic.  Sclerae nonicteric, noninjected.  Extraocular movements intact bilaterally.  Neck:  Without mass or deformity.  Trachea is midline. Lungs:  Clear to auscultate bilaterally without rhonchi, wheeze, or rales. Heart:  Regular rate and rhythm.  Without murmurs, rubs, or gallops. Abdomen:  Soft, nontender, nondistended.  Without guarding or rebound. Extremities: Without cyanosis, clubbing, edema, or obvious deformity. Right upper extremity dialysis fistula noted Vascular:  Dorsalis pedis and posterior tibial pulses palpable bilaterally. Skin:  Warm and dry, no erythema, no ulcerations.  The results of significant diagnostics from this hospitalization (including imaging, microbiology, ancillary and laboratory) are listed below for reference.  Microbiology: Recent Results (from the past 240 hour(s))  Culture, blood (routine x 2)     Status: None   Collection Time: 07/19/20 10:25 PM   Specimen: BLOOD  Result Value Ref Range Status   Specimen Description BLOOD SITE NOT SPECIFIED  Final   Special Requests   Final    BOTTLES DRAWN AEROBIC ONLY Blood Culture adequate volume   Culture   Final    NO GROWTH 5 DAYS Performed at [facility], [address], [place], [place] [address]    Report Status 07/24/2020 FINAL  Final  Culture, blood (Routine X 2) w Reflex to ID Panel     Status: Abnormal   Collection Time: 07/20/20  6:29 AM   Specimen: BLOOD LEFT HAND  Result Value Ref Range Status   Specimen Description BLOOD LEFT HAND  Final   Special Requests   Final    BOTTLES DRAWN AEROBIC AND ANAEROBIC Blood Culture results may not be optimal due to an inadequate volume of blood received in culture bottles   Culture  Setup Time   Final    GRAM POSITIVE COCCI ANAEROBIC BOTTLE ONLY CRITICAL VALUE NOTED.  VALUE IS CONSISTENT WITH  PREVIOUSLY REPORTED AND CALLED VALUE.    Culture (A)  Final    STAPHYLOCOCCUS AURICULARIS THE SIGNIFICANCE OF ISOLATING THIS ORGANISM FROM A SINGLE SET OF BLOOD CULTURES WHEN MULTIPLE SETS ARE DRAWN IS UNCERTAIN. PLEASE NOTIFY THE MICROBIOLOGY DEPARTMENT WITHIN ONE WEEK IF SPECIATION AND SENSITIVITIES ARE REQUIRED. Performed at [facility], [place] [address] [place], [place] [address]    Report Status 07/23/2020 FINAL  Final  Culture, Respiratory w Gram Stain     Status: None   Collection Time: 07/23/20 10:41 AM   Specimen: Tracheal Aspirate; Respiratory  Result Value Ref Range Status   Specimen Description TRACHEAL ASPIRATE  Final   Special Requests NONE  Final   Gram Stain   Final    ABUNDANT WBC PRESENT,BOTH PMN AND MONONUCLEAR ABUNDANT GRAM POSITIVE RODS FEW GRAM POSITIVE COCCI    Culture   Final    MODERATE METHICILLIN RESISTANT STAPHYLOCOCCUS AUREUS No Pseudomonas species isolated ABUNDANT DIPHTHEROIDS(CORYNEBACTERIUM SPECIES) Standardized susceptibility testing for this organism is not available. Performed at [facility], [place] [address], [place], [place] [address]    Report Status 07/26/2020 FINAL  Final   Organism ID, Bacteria METHICILLIN RESISTANT STAPHYLOCOCCUS AUREUS  Final      Susceptibility   Methicillin resistant staphylococcus aureus - MIC*    CIPROFLOXACIN >=8 RESISTANT Resistant     ERYTHROMYCIN >=8 RESISTANT Resistant     GENTAMICIN <=0.5 SENSITIVE Sensitive     OXACILLIN >=4 RESISTANT Resistant     TETRACYCLINE <=1 SENSITIVE Sensitive     VANCOMYCIN 1 SENSITIVE Sensitive     TRIMETH/SULFA 80 RESISTANT Resistant     CLINDAMYCIN >=8 RESISTANT Resistant     RIFAMPIN <=0.5 SENSITIVE Sensitive     Inducible Clindamycin NEGATIVE Sensitive     * MODERATE METHICILLIN RESISTANT STAPHYLOCOCCUS AUREUS     Labs: BNP (last 3 results) Recent Labs    07/14/20 0058  BNP Q000111Q*   Basic Metabolic Panel: Recent Labs  Lab 07/23/20 0520 07/24/20 0236  07/25/20 0620 07/26/20 0419 07/27/20 0429 07/28/20 0544 07/29/20 0707  NA 136 132* 137 139 138 139 136  K 3.8 3.5 3.4* 3.4* 3.7 3.8 4.2  CL 98 96* 99 98 100 101 100  CO2 27 22 20* '26 22 27 26  '$ GLUCOSE 148* 147* 147*  148* 145* 48* 142*  BUN 43* 61* 86* 34* 45* 20 28*  CREATININE 5.54* 6.88* 9.23* 5.52* 7.54* 5.14* 6.54*  CALCIUM 9.7 9.3 9.4 9.0 9.4 9.2 8.7*  MG 2.2 2.3 2.6*  --   --   --   --   PHOS  --  5.1* 5.8*  --   --   --  5.7*   Liver Function Tests: Recent Labs  Lab 07/24/20 0236 07/29/20 0707  AST 103*  --   ALT 106*  --   ALKPHOS 144*  --   BILITOT 0.4  --   PROT 7.5  --   ALBUMIN 2.6* 2.2*   No results for input(s): LIPASE, AMYLASE in the last 168 hours. No results for input(s): AMMONIA in the last 168 hours. CBC: Recent Labs  Lab 07/23/20 0520 07/24/20 0236 07/25/20 0620 07/26/20 0419 07/27/20 0429 07/28/20 0544 07/29/20 0706  WBC 19.2*   < > 15.4* 10.5 8.9 6.8 6.0  NEUTROABS 13.2*  --   --   --   --   --   --   HGB 11.4*   < > 10.0* 10.5* 10.7* 10.8* 9.4*  HCT 35.8*   < > 30.6* 32.6* 34.6* 34.0* 30.2*  MCV 96.0   < > 95.0 95.3 97.5 97.1 98.1  PLT 179   < > 173 185 195 188 174   < > = values in this interval not displayed.   Cardiac Enzymes: No results for input(s): CKTOTAL, CKMB, CKMBINDEX, TROPONINI in the last 168 hours. BNP: Invalid input(s): POCBNP CBG: Recent Labs  Lab 07/28/20 1742 07/28/20 2259 07/29/20 0236 07/29/20 0628 07/29/20 1222  GLUCAP 115* 147* 161* 147* 101*   D-Dimer No results for input(s): DDIMER in the last 72 hours. Hgb A1c No results for input(s): HGBA1C in the last 72 hours. Lipid Profile No results for input(s): CHOL, HDL, LDLCALC, TRIG, CHOLHDL, LDLDIRECT in the last 72 hours. Thyroid function studies No results for input(s): TSH, T4TOTAL, T3FREE, THYROIDAB in the last 72 hours.  Invalid input(s): FREET3 Anemia work up No results for input(s): VITAMINB12, FOLATE, FERRITIN, TIBC, IRON, RETICCTPCT in the  last 72 hours. Urinalysis    Component Value Date/Time   COLORURINE STRAW (A) 03/08/2020 1911   APPEARANCEUR CLEAR 03/08/2020 1911   LABSPEC 1.006 03/08/2020 1911   PHURINE 9.0 (H) 03/08/2020 1911   GLUCOSEU 150 (A) 03/08/2020 1911   HGBUR SMALL (A) 03/08/2020 1911   BILIRUBINUR NEGATIVE 03/08/2020 1911   KETONESUR NEGATIVE 03/08/2020 1911   PROTEINUR 100 (A) 03/08/2020 1911   NITRITE NEGATIVE 03/08/2020 1911   LEUKOCYTESUR NEGATIVE 03/08/2020 1911   Sepsis Labs Invalid input(s): PROCALCITONIN,  WBC,  LACTICIDVEN Microbiology Recent Results (from the past 240 hour(s))  Culture, blood (routine x 2)     Status: None   Collection Time: 07/19/20 10:25 PM   Specimen: BLOOD  Result Value Ref Range Status   Specimen Description BLOOD SITE NOT SPECIFIED  Final   Special Requests   Final    BOTTLES DRAWN AEROBIC ONLY Blood Culture adequate volume   Culture   Final    NO GROWTH 5 DAYS Performed at [facility], [address], [place], [place] [address]    Report Status 07/24/2020 FINAL  Final  Culture, blood (Routine X 2) w Reflex to ID Panel     Status: Abnormal   Collection Time: 07/20/20  6:29 AM   Specimen: BLOOD LEFT HAND  Result Value Ref Range Status   Specimen Description  BLOOD LEFT HAND  Final   Special Requests   Final    BOTTLES DRAWN AEROBIC AND ANAEROBIC Blood Culture results may not be optimal due to an inadequate volume of blood received in culture bottles   Culture  Setup Time   Final    GRAM POSITIVE COCCI ANAEROBIC BOTTLE ONLY CRITICAL VALUE NOTED.  VALUE IS CONSISTENT WITH PREVIOUSLY REPORTED AND CALLED VALUE.    Culture (A)  Final    STAPHYLOCOCCUS AURICULARIS THE SIGNIFICANCE OF ISOLATING THIS ORGANISM FROM A SINGLE SET OF BLOOD CULTURES WHEN MULTIPLE SETS ARE DRAWN IS UNCERTAIN. PLEASE NOTIFY THE MICROBIOLOGY DEPARTMENT WITHIN ONE WEEK IF SPECIATION AND SENSITIVITIES ARE REQUIRED. Performed at [facility], [place] [address] [place], [place]  [address]    Report Status 07/23/2020 FINAL  Final  Culture, Respiratory w Gram Stain     Status: None   Collection Time: 07/23/20 10:41 AM   Specimen: Tracheal Aspirate; Respiratory  Result Value Ref Range Status   Specimen Description TRACHEAL ASPIRATE  Final   Special Requests NONE  Final   Gram Stain   Final    ABUNDANT WBC PRESENT,BOTH PMN AND MONONUCLEAR ABUNDANT GRAM POSITIVE RODS FEW GRAM POSITIVE COCCI    Culture   Final    MODERATE METHICILLIN RESISTANT STAPHYLOCOCCUS AUREUS No Pseudomonas species isolated ABUNDANT DIPHTHEROIDS(CORYNEBACTERIUM SPECIES) Standardized susceptibility testing for this organism is not available. Performed at [facility], [place] [address], [place], [place] [address]    Report Status 07/26/2020 FINAL  Final   Organism ID, Bacteria METHICILLIN RESISTANT STAPHYLOCOCCUS AUREUS  Final      Susceptibility   Methicillin resistant staphylococcus aureus - MIC*    CIPROFLOXACIN >=8 RESISTANT Resistant     ERYTHROMYCIN >=8 RESISTANT Resistant     GENTAMICIN <=0.5 SENSITIVE Sensitive     OXACILLIN >=4 RESISTANT Resistant     TETRACYCLINE <=1 SENSITIVE Sensitive     VANCOMYCIN 1 SENSITIVE Sensitive     TRIMETH/SULFA 80 RESISTANT Resistant     CLINDAMYCIN >=8 RESISTANT Resistant     RIFAMPIN <=0.5 SENSITIVE Sensitive     Inducible Clindamycin NEGATIVE Sensitive     * MODERATE METHICILLIN RESISTANT STAPHYLOCOCCUS AUREUS     Time coordinating discharge: Over 30 minutes  SIGNED:   Little Ishikawa, DO [facility] 07/29/2020, 1:20 PM Pager   If 7PM-7AM, please contact night-coverage [email or web address]

## 2020-07-29 NOTE — Progress Notes (Signed)
Called for report to  [facility] [phone])  2x, unsuccesful. First time was answered, transferred call to receiving room138A with no answer. Second attempt no answer from facility.

## 2020-07-29 NOTE — NC FL2 (Signed)
[place] LEVEL OF CARE SCREENING TOOL     IDENTIFICATION  Patient Name: Jorge [name] Birthdate: Apr 26, 1970 Sex: male Admission Date (Current Location): 07/13/2020  [facility] and [place] Number:  Herbalist and Address:  [facility], [place] [address] [place], [place] [address]      Provider Number: [id number]  Attending Physician Name and Address:  Little Ishikawa, MD  Relative Name and Phone Number:  Isidro, [place] (Sister)   [phone] [facility])    Current Level of Care: Hospital Recommended Level of Care: [place] Prior Approval Number:    Date Approved/Denied:   PASRR Number: [id number]  Discharge Plan: SNF    Current Diagnoses: Patient Active Problem List   Diagnosis Date Noted   Malnutrition of moderate degree 07/14/2020   AMS (altered mental status) 07/13/2020   Hypertensive urgency 07/13/2020   Hyperlipidemia associated with type 2 diabetes mellitus ([place])    Stupor 05/10/2020   TIA (transient ischemic attack) 05/09/2020   Acute encephalopathy 10/03/2019   Personal history of anaphylaxis 08/28/2019   Coagulation defect, unspecified ([place]) 05/05/2019   Gastroparesis due to DM ([place]) 04/28/2019   Labile blood glucose    Anemia of chronic disease    S/P BKA (below knee amputation) bilateral ([place]) 03/31/2019   ESRD on peritoneal dialysis ([place])    ESRD (end stage renal disease) on dialysis ([place]) 03/19/2019   Hypertension associated with diabetes ([place])    Type 2 diabetes mellitus with chronic kidney disease on chronic dialysis, with long-term current use of insulin ([place])    Encounter for adequacy testing for peritoneal dialysis ([place]) 01/10/2019   Other disorders of bilirubin metabolism 01/10/2019   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   Other disorders resulting from impaired renal tubular function 07/05/2016   Secondary hyperparathyroidism of renal origin ([place]) 07/05/2016    Orientation RESPIRATION  BLADDER Height & Weight     Self, Time, Situation, Place  Normal Continent Weight: 135 lb 5.8 oz (61.4 kg) Height:  '5\' 11"'$  (180.3 cm)  BEHAVIORAL SYMPTOMS/MOOD NEUROLOGICAL BOWEL NUTRITION STATUS      Incontinent Diet (see d/c summary)  AMBULATORY STATUS COMMUNICATION OF NEEDS Skin   Extensive Assist Verbally Normal                       Personal Care Assistance Level of Assistance  Bathing, Feeding, Dressing Bathing Assistance: Maximum assistance Feeding assistance: Independent Dressing Assistance: Maximum assistance     Functional Limitations Info  Sight, Speech, Hearing Sight Info: Adequate Hearing Info: Adequate Speech Info: Adequate    SPECIAL CARE FACTORS FREQUENCY  PT (By licensed PT), OT (By licensed OT)     PT Frequency: 5x/week OT Frequency: 5x/week            Contractures Contractures Info: Not present    Additional Factors Info  Code Status, Allergies Code Status Info: Full code Allergies Info: Lactose Intolerance           Current Medications (07/29/2020):  This is the current hospital active medication list Current Facility-Administered Medications  Medication Dose Route Frequency Provider Last Rate Last Admin   (feeding supplement) PROSource Plus liquid 30 mL  30 mL Oral BID BM Little Ishikawa, MD   30 mL at 07/28/20 1357   0.9 %  sodium chloride infusion  100 mL Intravenous PRN Madelon Lips, MD       0.9 %  sodium chloride infusion   Intravenous PRN Ina Homes  C, MD 10 mL/hr at 07/24/20 1900 Infusion Verify at 07/24/20 1900   alteplase (CATHFLO ACTIVASE) injection 2 mg  2 mg Intracatheter Once PRN Madelon Lips, MD       aspirin EC tablet 81 mg  81 mg Oral Daily Iona Beard, MD   81 mg at 07/28/20 1032   atorvastatin (LIPITOR) tablet 40 mg  40 mg Oral Daily Iona Beard, MD   40 mg at 07/28/20 1032   Chlorhexidine Gluconate Cloth 2 % PADS 6 each  6 each Topical Q1200 Icard, Bradley L, DO   6 each at 07/28/20 1307    Darbepoetin Alfa (ARANESP) injection 40 mcg  40 mcg Intravenous Q Pearla Dubonnet, MD   40 mcg at 07/29/20 [id number]   diphenhydrAMINE-zinc acetate (BENADRYL) 2-0.1 % cream   Topical Daily PRN Shela Leff, MD   1 application at [id number]   feeding supplement (NEPRO CARB STEADY) liquid 237 mL  237 mL Oral BID BM Little Ishikawa, MD   237 mL at 07/28/20 1716   fiber (NUTRISOURCE FIBER) 1 packet  1 packet Oral BID Little Ishikawa, MD   1 packet at 07/28/20 2230   gabapentin (NEURONTIN) capsule 100 mg  100 mg Oral TID Little Ishikawa, MD   100 mg at 07/28/20 2231   heparin injection 1,000 Units  1,000 Units Dialysis PRN Madelon Lips, MD   1,000 Units at 07/29/20 [id number]   heparin injection 5,000 Units  5,000 Units Subcutaneous Q8H Candee Furbish, MD   5,000 Units at 07/29/20 0604   insulin aspart (novoLOG) injection 0-15 Units  0-15 Units Subcutaneous TID WC Iona Beard, MD   2 Units at 07/28/20 1205   lanthanum (FOSRENOL) chewable tablet 500 mg  500 mg Oral TID WC Iona Beard, MD   500 mg at 07/28/20 1704   lidocaine (PF) (XYLOCAINE) 1 % injection 5 mL  5 mL Intradermal PRN Madelon Lips, MD       lidocaine-prilocaine (EMLA) cream 1 application  1 application Topical PRN Madelon Lips, MD       multivitamin (RENA-VIT) tablet 1 tablet  1 tablet Oral QHS Kipp Brood, MD   1 tablet at 07/28/20 2231   pentafluoroprop-tetrafluoroeth (GEBAUERS) aerosol 1 application  1 application Topical PRN Madelon Lips, MD       sertraline (ZOLOFT) tablet 25 mg  25 mg Oral Daily Little Ishikawa, MD   25 mg at 07/28/20 1703   sodium chloride 0.9 % bolus 500 mL  500 mL Intravenous Q1H PRN Roney Jaffe, MD       sodium chloride flush (NS) 0.9 % injection 10-40 mL  10-40 mL Intracatheter Q12H Candee Furbish, MD   10 mL at 07/28/20 2231   sodium chloride flush (NS) 0.9 % injection 10-40 mL  10-40 mL Intracatheter PRN Candee Furbish, MD       vancomycin (VANCOCIN)  IVPB 750 mg/150 ml premix  750 mg Intravenous Q M,W,F-HD Little Ishikawa, MD       vancomycin (VANCOREADY) 750 MG/150ML IVPB              Discharge Medications: Please see discharge summary for a list of discharge medications.  Relevant Imaging Results:  Relevant Lab Results:   Additional Information SS#: [id number]  Bethann Berkshire, LCSW

## 2020-07-29 NOTE — Plan of Care (Signed)
  Problem: Education: Goal: Knowledge of General Education information will improve Description: Including pain rating scale, medication(s)/side effects and non-pharmacologic comfort measures Outcome: Adequate for Discharge   Problem: Health Behavior/Discharge Planning: Goal: Ability to manage health-related needs will improve Outcome: Adequate for Discharge   Problem: Clinical Measurements: Goal: Ability to maintain clinical measurements within normal limits will improve Outcome: Adequate for Discharge Goal: Will remain free from infection Outcome: Adequate for Discharge Goal: Diagnostic test results will improve Outcome: Adequate for Discharge Goal: Respiratory complications will improve Outcome: Adequate for Discharge Goal: Cardiovascular complication will be avoided Outcome: Adequate for Discharge   Problem: Activity: Goal: Risk for activity intolerance will decrease Outcome: Adequate for Discharge   Problem: Nutrition: Goal: Adequate nutrition will be maintained Outcome: Adequate for Discharge   Problem: Coping: Goal: Level of anxiety will decrease Outcome: Adequate for Discharge   Problem: Elimination: Goal: Will not experience complications related to bowel motility Outcome: Adequate for Discharge Goal: Will not experience complications related to urinary retention Outcome: Adequate for Discharge   Problem: Pain Managment: Goal: General experience of comfort will improve Outcome: Adequate for Discharge   Problem: Safety: Goal: Ability to remain free from injury will improve Outcome: Adequate for Discharge   Problem: Skin Integrity: Goal: Risk for impaired skin integrity will decrease Outcome: Adequate for Discharge   Problem: Safety: Goal: Non-violent Restraint(s) Outcome: Adequate for Discharge   Problem: Malnutrition  (NI-5.2) Goal: Food and/or nutrient delivery Description: Individualized approach for food/nutrient provision. Outcome: Adequate for  Discharge   Problem: SLP Dysphagia Goals Goal: Patient will utilize recommended strategies Description: Patient will utilize recommended strategies during swallow to increase swallowing safety with Outcome: Adequate for Discharge   Problem: Acute Rehab PT Goals(only PT should resolve) Goal: Pt Will Go Supine/Side To Sit Outcome: Adequate for Discharge Goal: Patient Will Transfer Sit To/From Stand Outcome: Adequate for Discharge Goal: Pt Will Transfer Bed To Chair/Chair To Bed Outcome: Adequate for Discharge Goal: Pt Will Perform Standing Balance Or Pre-Gait Outcome: Adequate for Discharge Goal: Pt Will Ambulate Outcome: Adequate for Discharge   Problem: Acute Rehab OT Goals (only OT should resolve) Goal: Pt. Will Perform Eating Outcome: Adequate for Discharge Goal: Pt. Will Perform Grooming Outcome: Adequate for Discharge Goal: Pt. Will Perform Upper Body Dressing Outcome: Adequate for Discharge Goal: Pt. Will Perform Lower Body Dressing Outcome: Adequate for Discharge Goal: Pt. Will Transfer To Toilet Outcome: Adequate for Discharge Goal: Pt. Will Perform Toileting-Clothing Manipulation Outcome: Adequate for Discharge Goal: OT Additional ADL Goal #1 Outcome: Adequate for Discharge

## 2020-07-29 NOTE — Progress Notes (Signed)
Unable to give report to staff in [place], call was on hold twice. RN will try to call later for report.

## 2020-07-29 NOTE — Procedures (Signed)
I was present at this dialysis session. I have reviewed the session itself and made appropriate changes.   Vital signs in last 24 hours:  Temp:  [97.8 F (36.6 C)-98.5 F (36.9 C)] 98.1 F (36.7 C) (07/22 0800) Pulse Rate:  [66-73] 66 (07/22 0809) Resp:  [10-18] 12 (07/22 0809) BP: (122-148)/(34-66) 140/34 (07/22 0809) SpO2:  [94 %-100 %] 100 % (07/22 0800) Weight:  [61.4 kg-62 kg] 61.4 kg (07/22 0800) Weight change: -1.3 kg Filed Weights   07/27/20 1200 07/29/20 0629 07/29/20 0800  Weight: 62 kg 62 kg 61.4 kg    Recent Labs  Lab 07/25/20 0620 07/26/20 0419 07/28/20 0544  NA 137   < > 139  K 3.4*   < > 3.8  CL 99   < > 101  CO2 20*   < > 27  GLUCOSE 147*   < > 48*  BUN 86*   < > 20  CREATININE 9.23*   < > 5.14*  CALCIUM 9.4   < > 9.2  PHOS 5.8*  --   --    < > = values in this interval not displayed.    Recent Labs  Lab 07/23/20 0520 07/24/20 0236 07/27/20 0429 07/28/20 0544 07/29/20 0706  WBC 19.2*   < > 8.9 6.8 6.0  NEUTROABS 13.2*  --   --   --   --   HGB 11.4*   < > 10.7* 10.8* 9.4*  HCT 35.8*   < > 34.6* 34.0* 30.2*  MCV 96.0   < > 97.5 97.1 98.1  PLT 179   < > 195 188 174   < > = values in this interval not displayed.    Scheduled Meds:  (feeding supplement) PROSource Plus  30 mL Oral BID BM   aspirin EC  81 mg Oral Daily   atorvastatin  40 mg Oral Daily   Chlorhexidine Gluconate Cloth  6 each Topical Q1200   feeding supplement (NEPRO CARB STEADY)  237 mL Oral BID BM   fiber  1 packet Oral BID   gabapentin  100 mg Oral TID   heparin injection (subcutaneous)  5,000 Units Subcutaneous Q8H   insulin aspart  0-15 Units Subcutaneous TID WC   lanthanum  500 mg Oral TID WC   multivitamin  1 tablet Oral QHS   sertraline  25 mg Oral Daily   sodium chloride flush  10-40 mL Intracatheter Q12H   Continuous Infusions:  sodium chloride     sodium chloride 10 mL/hr at 07/24/20 1900   sodium chloride     vancomycin     PRN Meds:.sodium chloride, sodium  chloride, alteplase, diphenhydrAMINE-zinc acetate, heparin, lidocaine (PF), lidocaine-prilocaine, pentafluoroprop-tetrafluoroeth, sodium chloride, sodium chloride flush   Donetta Potts,  MD 07/29/2020, 8:37 AM

## 2020-07-29 NOTE — TOC Progression Note (Signed)
Transition of Care [facility]) - Progression Note    Patient Details  Name: Jorge [name] MRN: [id number] Date of Birth: Nov 25, 1970  Transition [facility]) CM/SW [place], [place] Phone Number: 07/29/2020, 10:30 AM  Clinical Narrative:     Pt is LTC at [facility] but will attempt to get therapy services through medicare. CSW started auth Ref# [id number]. Pt can return today after HD. Rapid covid ordered.   Expected Discharge Plan: [place] Barriers to Discharge: Continued Medical Work up  Expected Discharge Plan and Services Expected Discharge Plan: [place] arrangements for the past 2 months: Single Family Home                                       Social Determinants of Health (SDOH) Interventions    Readmission Risk Interventions No flowsheet data found.

## 2020-07-29 NOTE — Progress Notes (Signed)
Called again for report to [facility] by CN Charito and this RN. Facility  answered and transferred to RN, per RN she is about to leave in 10 mins, was transferred again to incoming RN, was on phone for 82mnute with no answer.

## 2020-07-29 NOTE — TOC Transition Note (Addendum)
Transition of Care [facility]) - CM/SW Discharge Note   Patient Details  Name: Jorge [name] MRN: [id number] Date of Birth: [birth year]  [facility]) CM/SW Contact:  Bethann Berkshire, [place] Phone Number: 07/29/2020, 2:57 PM   Clinical Narrative:    CSW started [facility]; still pending. CSW received call about pt's PLOF. CSW spoke with LTC facility and received PLOF; updated pt's auth with PLOF. CSW is informed it will likely go to medical director for review. CSW notified [facility]. They will still accept pt back today as he is LTC with medicaid. Covid test is negative.    Patient will DC to: [facility] Anticipated DC date: 07/29/20 Family notified: Called sister; no answer, no voicemail available Transport by: Corey Harold   Per MD patient ready for DC to [facility]. RN, patient, and facility notified of DC. Discharge Summary and FL2 sent to facility. RN to call report prior to discharge [phone] Room 138A). DC packet on chart. Ambulance transport requested for patient.   CSW will sign off for now as social work intervention is no longer needed. Please consult [place] again if new needs arise.   1546: CSW notified by Everlene Balls that Josem Kaufmann is going for Peer to peer review. A provider would need to call [phone] option 5. Deadline is Monday 7/25 11am eastern time. CSW notified [facility] liaison of these details.   Final next level of care: Skilled Nursing Facility Barriers to Discharge: No Barriers Identified   Patient Goals and CMS Choice        Discharge Placement              Patient chooses bed at:  ([facility]) Patient to be transferred to facility by: [facility] Name of family member notified: Called sister; no answer and no voicemailbox available Patient and family notified of of transfer: 07/29/20  Discharge Plan and Services                                     Social Determinants of Health (SDOH) Interventions     Readmission Risk  Interventions No flowsheet data found.

## 2020-07-29 NOTE — Progress Notes (Signed)
DISCHARGE NOTE SNF   Jorge [name] to be discharged [place] per MD order. Patient verbalized understanding.  Skin clean, dry and intact without evidence of skin break down, no evidence of skin tears noted. IV catheter discontinued intact. Site without signs and symptoms of complications. Dressing and pressure applied. Pt denies pain at the site currently. No complaints noted.  Patient free of lines, drains, and wounds.   Discharge packet assembled. An After Visit Summary (AVS) was printed and given to the EMS personnel. Patient escorted via stretcher and discharged to [facility] via ambulance. Report called to accepting facility multiple times unsuccessfully. Given t o PTAR unit phone no. for questions. Babs Sciara, RN

## 2020-07-29 NOTE — Progress Notes (Signed)
Unable to contact staff in [facility].

## 2020-07-30 DIAGNOSIS — I629 Nontraumatic intracranial hemorrhage, unspecified: Secondary | ICD-10-CM | POA: Diagnosis not present

## 2020-07-30 DIAGNOSIS — R2681 Unsteadiness on feet: Secondary | ICD-10-CM | POA: Diagnosis not present

## 2020-07-30 DIAGNOSIS — R2689 Other abnormalities of gait and mobility: Secondary | ICD-10-CM | POA: Diagnosis not present

## 2020-07-30 DIAGNOSIS — M6281 Muscle weakness (generalized): Secondary | ICD-10-CM | POA: Diagnosis not present

## 2020-07-31 DIAGNOSIS — R2681 Unsteadiness on feet: Secondary | ICD-10-CM | POA: Diagnosis not present

## 2020-07-31 DIAGNOSIS — R2689 Other abnormalities of gait and mobility: Secondary | ICD-10-CM | POA: Diagnosis not present

## 2020-07-31 DIAGNOSIS — I629 Nontraumatic intracranial hemorrhage, unspecified: Secondary | ICD-10-CM | POA: Diagnosis not present

## 2020-07-31 DIAGNOSIS — M6281 Muscle weakness (generalized): Secondary | ICD-10-CM | POA: Diagnosis not present

## 2020-08-01 DIAGNOSIS — R2681 Unsteadiness on feet: Secondary | ICD-10-CM | POA: Diagnosis not present

## 2020-08-01 DIAGNOSIS — M6281 Muscle weakness (generalized): Secondary | ICD-10-CM | POA: Diagnosis not present

## 2020-08-01 DIAGNOSIS — N186 End stage renal disease: Secondary | ICD-10-CM | POA: Diagnosis not present

## 2020-08-01 DIAGNOSIS — I679 Cerebrovascular disease, unspecified: Secondary | ICD-10-CM | POA: Diagnosis not present

## 2020-08-01 DIAGNOSIS — N2581 Secondary hyperparathyroidism of renal origin: Secondary | ICD-10-CM | POA: Diagnosis not present

## 2020-08-01 DIAGNOSIS — I10 Essential (primary) hypertension: Secondary | ICD-10-CM | POA: Diagnosis not present

## 2020-08-01 DIAGNOSIS — R2689 Other abnormalities of gait and mobility: Secondary | ICD-10-CM | POA: Diagnosis not present

## 2020-08-01 DIAGNOSIS — Z992 Dependence on renal dialysis: Secondary | ICD-10-CM | POA: Diagnosis not present

## 2020-08-01 DIAGNOSIS — I629 Nontraumatic intracranial hemorrhage, unspecified: Secondary | ICD-10-CM | POA: Diagnosis not present

## 2020-08-01 DIAGNOSIS — R197 Diarrhea, unspecified: Secondary | ICD-10-CM | POA: Diagnosis not present

## 2020-08-01 DIAGNOSIS — E1122 Type 2 diabetes mellitus with diabetic chronic kidney disease: Secondary | ICD-10-CM | POA: Diagnosis not present

## 2020-08-02 ENCOUNTER — Emergency Department (HOSPITAL_COMMUNITY)
Admission: EM | Admit: 2020-08-02 | Discharge: 2020-08-03 | Disposition: A | Discharge status: Home / Self Care | Payer: Medicare Other | Attending: Emergency Medicine | Admitting: Emergency Medicine

## 2020-08-02 ENCOUNTER — Emergency Department (HOSPITAL_COMMUNITY): Payer: Medicare Other

## 2020-08-02 ENCOUNTER — Encounter (HOSPITAL_COMMUNITY): Payer: Self-pay | Admitting: Emergency Medicine

## 2020-08-02 DIAGNOSIS — E162 Hypoglycemia, unspecified: Secondary | ICD-10-CM

## 2020-08-02 DIAGNOSIS — E11649 Type 2 diabetes mellitus with hypoglycemia without coma: Secondary | ICD-10-CM | POA: Insufficient documentation

## 2020-08-02 DIAGNOSIS — Z743 Need for continuous supervision: Secondary | ICD-10-CM | POA: Diagnosis not present

## 2020-08-02 DIAGNOSIS — R6889 Other general symptoms and signs: Secondary | ICD-10-CM | POA: Diagnosis not present

## 2020-08-02 DIAGNOSIS — R739 Hyperglycemia, unspecified: Secondary | ICD-10-CM | POA: Diagnosis not present

## 2020-08-02 DIAGNOSIS — R61 Generalized hyperhidrosis: Secondary | ICD-10-CM | POA: Diagnosis not present

## 2020-08-02 DIAGNOSIS — R531 Weakness: Secondary | ICD-10-CM | POA: Diagnosis not present

## 2020-08-02 DIAGNOSIS — I10 Essential (primary) hypertension: Secondary | ICD-10-CM | POA: Diagnosis not present

## 2020-08-02 DIAGNOSIS — Z87891 Personal history of nicotine dependence: Secondary | ICD-10-CM | POA: Diagnosis not present

## 2020-08-02 DIAGNOSIS — Z7982 Long term (current) use of aspirin: Secondary | ICD-10-CM | POA: Diagnosis not present

## 2020-08-02 DIAGNOSIS — E1110 Type 2 diabetes mellitus with ketoacidosis without coma: Secondary | ICD-10-CM | POA: Insufficient documentation

## 2020-08-02 DIAGNOSIS — R2689 Other abnormalities of gait and mobility: Secondary | ICD-10-CM | POA: Diagnosis not present

## 2020-08-02 DIAGNOSIS — E1122 Type 2 diabetes mellitus with diabetic chronic kidney disease: Secondary | ICD-10-CM | POA: Diagnosis not present

## 2020-08-02 DIAGNOSIS — I120 Hypertensive chronic kidney disease with stage 5 chronic kidney disease or end stage renal disease: Secondary | ICD-10-CM | POA: Diagnosis not present

## 2020-08-02 DIAGNOSIS — M6281 Muscle weakness (generalized): Secondary | ICD-10-CM | POA: Diagnosis not present

## 2020-08-02 DIAGNOSIS — Z79899 Other long term (current) drug therapy: Secondary | ICD-10-CM | POA: Diagnosis not present

## 2020-08-02 DIAGNOSIS — I629 Nontraumatic intracranial hemorrhage, unspecified: Secondary | ICD-10-CM | POA: Diagnosis not present

## 2020-08-02 DIAGNOSIS — Z992 Dependence on renal dialysis: Secondary | ICD-10-CM | POA: Insufficient documentation

## 2020-08-02 DIAGNOSIS — N186 End stage renal disease: Secondary | ICD-10-CM | POA: Insufficient documentation

## 2020-08-02 DIAGNOSIS — R2681 Unsteadiness on feet: Secondary | ICD-10-CM | POA: Diagnosis not present

## 2020-08-02 LAB — CBC
HCT: 38.6 % — ABNORMAL LOW (ref 39.0–52.0)
Hemoglobin: 12 g/dL — ABNORMAL LOW (ref 13.0–17.0)
MCH: 31.3 pg (ref 26.0–34.0)
MCHC: 31.1 g/dL (ref 30.0–36.0)
MCV: 100.8 fL — ABNORMAL HIGH (ref 80.0–100.0)
Platelets: 217 10*3/uL (ref 150–400)
RBC: 3.83 MIL/uL — ABNORMAL LOW (ref 4.22–5.81)
RDW: 15.6 % — ABNORMAL HIGH (ref 11.5–15.5)
WBC: 11.7 10*3/uL — ABNORMAL HIGH (ref 4.0–10.5)
nRBC: 0 % (ref 0.0–0.2)

## 2020-08-02 LAB — CBG MONITORING, ED
Glucose-Capillary: 100 mg/dL — ABNORMAL HIGH (ref 70–99)
Glucose-Capillary: 130 mg/dL — ABNORMAL HIGH (ref 70–99)

## 2020-08-02 LAB — BASIC METABOLIC PANEL
Anion gap: 17 — ABNORMAL HIGH (ref 5–15)
BUN: 32 mg/dL — ABNORMAL HIGH (ref 6–20)
CO2: 25 mmol/L (ref 22–32)
Calcium: 9.6 mg/dL (ref 8.9–10.3)
Chloride: 94 mmol/L — ABNORMAL LOW (ref 98–111)
Creatinine, Ser: 7.33 mg/dL — ABNORMAL HIGH (ref 0.61–1.24)
GFR, Estimated: 8 mL/min — ABNORMAL LOW (ref >60)
Glucose, Bld: 106 mg/dL — ABNORMAL HIGH (ref 70–99)
Potassium: 4 mmol/L (ref 3.5–5.1)
Sodium: 136 mmol/L (ref 135–145)

## 2020-08-02 IMAGING — DX DG CHEST 1V
1 series · 1 of 1 positions shown · non-contrast
Comparison: Chest radiograph dated [DATE].

CLINICAL DATA: 50-year-old male with weakness.

EXAM:
CHEST  1 VIEW

[chest ap]
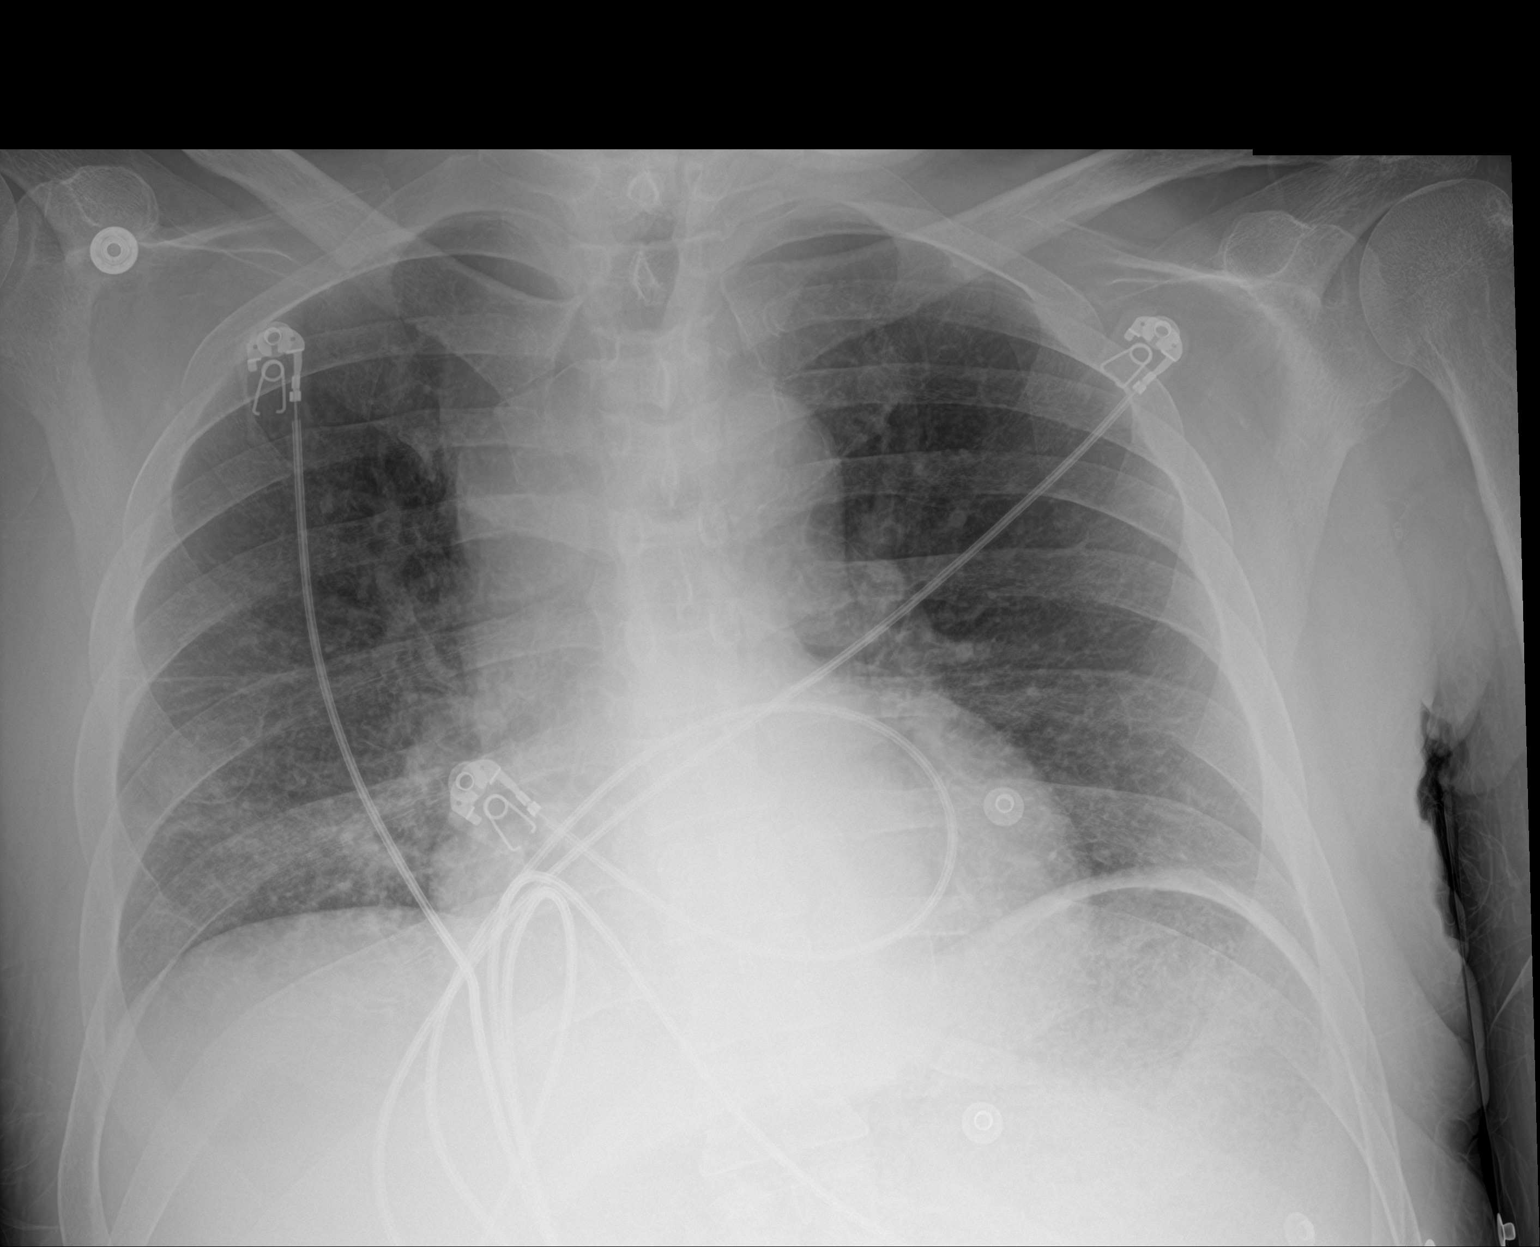

[1 of 1 positions shown; findings below may reference images not displayed]

FINDINGS: Shallow inspiration. Faint nodular densities primarily involving the
right lung base, likely infiltrate and improved since the prior
radiograph. No focal consolidation, pleural effusion, or
pneumothorax. Top-normal cardiac size. No acute osseous pathology.
IMPRESSION: Improved right lung base infiltrate.

## 2020-08-02 NOTE — ED Notes (Signed)
PTAR called  

## 2020-08-02 NOTE — ED Notes (Signed)
Multiple attempts made to obtain oral and axillary temperatures, unsuccessful. Will attempt rectal once patient is placed in room.

## 2020-08-02 NOTE — ED Notes (Signed)
Attempted to call report to [facility] x2. No answer.

## 2020-08-02 NOTE — ED Notes (Signed)
Patient to bathroom by wheelchair for BM. Minimal assistance needed while transferring.
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
## 2020-08-02 NOTE — ED Notes (Signed)
CBG 133 

## 2020-08-02 NOTE — Discharge Instructions (Addendum)
Please take patient's blood sugar every 2 hours and give something to eat with sugar if blood sugar is less than 150. Return to emergency department if worse at any time.

## 2020-08-02 NOTE — ED Notes (Signed)
Attempted to call [facility] x4. No answer.

## 2020-08-02 NOTE — ED Notes (Signed)
Patient transported to X-ray 

## 2020-08-02 NOTE — ED Triage Notes (Signed)
Pt bib [facility] from [facility] for sudden onset of weakness. Per [facility], pts cbg was 300-400. 7 units of insulin given. Pt hypotensive with EMS with bp of 72/44. MWF diaylsis but unable to do full treatment yesterday due to hypotension. Denies pain.   BP: 72/44  P: 72  R: 16  Spo2: 97% RA  CBG: 127

## 2020-08-03 DIAGNOSIS — E1122 Type 2 diabetes mellitus with diabetic chronic kidney disease: Secondary | ICD-10-CM | POA: Diagnosis not present

## 2020-08-03 DIAGNOSIS — R2681 Unsteadiness on feet: Secondary | ICD-10-CM | POA: Diagnosis not present

## 2020-08-03 DIAGNOSIS — Z992 Dependence on renal dialysis: Secondary | ICD-10-CM | POA: Diagnosis not present

## 2020-08-03 DIAGNOSIS — N186 End stage renal disease: Secondary | ICD-10-CM | POA: Diagnosis not present

## 2020-08-03 DIAGNOSIS — N2581 Secondary hyperparathyroidism of renal origin: Secondary | ICD-10-CM | POA: Diagnosis not present

## 2020-08-03 DIAGNOSIS — M6281 Muscle weakness (generalized): Secondary | ICD-10-CM | POA: Diagnosis not present

## 2020-08-03 DIAGNOSIS — R197 Diarrhea, unspecified: Secondary | ICD-10-CM | POA: Diagnosis not present

## 2020-08-03 DIAGNOSIS — R2689 Other abnormalities of gait and mobility: Secondary | ICD-10-CM | POA: Diagnosis not present

## 2020-08-03 DIAGNOSIS — I629 Nontraumatic intracranial hemorrhage, unspecified: Secondary | ICD-10-CM | POA: Diagnosis not present

## 2020-08-03 LAB — CBG MONITORING, ED
Glucose-Capillary: 133 mg/dL — ABNORMAL HIGH (ref 70–99)
Glucose-Capillary: 139 mg/dL — ABNORMAL HIGH (ref 70–99)

## 2020-08-03 NOTE — ED Notes (Signed)
Facility never answered and never got report on the pt. PT left at [place]

## 2020-08-03 NOTE — ED Notes (Signed)
Pt had stool all on self and in bed. Pt given a bed bath and all bedding and gown changed. Ready for transport

## 2020-08-04 DIAGNOSIS — R2689 Other abnormalities of gait and mobility: Secondary | ICD-10-CM | POA: Diagnosis not present

## 2020-08-04 DIAGNOSIS — R2681 Unsteadiness on feet: Secondary | ICD-10-CM | POA: Diagnosis not present

## 2020-08-04 DIAGNOSIS — I629 Nontraumatic intracranial hemorrhage, unspecified: Secondary | ICD-10-CM | POA: Diagnosis not present

## 2020-08-04 DIAGNOSIS — M6281 Muscle weakness (generalized): Secondary | ICD-10-CM | POA: Diagnosis not present

## 2020-08-05 DIAGNOSIS — Z992 Dependence on renal dialysis: Secondary | ICD-10-CM | POA: Diagnosis not present

## 2020-08-05 DIAGNOSIS — I629 Nontraumatic intracranial hemorrhage, unspecified: Secondary | ICD-10-CM | POA: Diagnosis not present

## 2020-08-05 DIAGNOSIS — N186 End stage renal disease: Secondary | ICD-10-CM | POA: Diagnosis not present

## 2020-08-05 DIAGNOSIS — R2689 Other abnormalities of gait and mobility: Secondary | ICD-10-CM | POA: Diagnosis not present

## 2020-08-05 DIAGNOSIS — N2581 Secondary hyperparathyroidism of renal origin: Secondary | ICD-10-CM | POA: Diagnosis not present

## 2020-08-05 DIAGNOSIS — R2681 Unsteadiness on feet: Secondary | ICD-10-CM | POA: Diagnosis not present

## 2020-08-05 DIAGNOSIS — R197 Diarrhea, unspecified: Secondary | ICD-10-CM | POA: Diagnosis not present

## 2020-08-05 DIAGNOSIS — M6281 Muscle weakness (generalized): Secondary | ICD-10-CM | POA: Diagnosis not present

## 2020-08-05 DIAGNOSIS — E1122 Type 2 diabetes mellitus with diabetic chronic kidney disease: Secondary | ICD-10-CM | POA: Diagnosis not present

## 2020-08-06 DIAGNOSIS — R2681 Unsteadiness on feet: Secondary | ICD-10-CM | POA: Diagnosis not present

## 2020-08-06 DIAGNOSIS — E118 Type 2 diabetes mellitus with unspecified complications: Secondary | ICD-10-CM | POA: Diagnosis not present

## 2020-08-06 DIAGNOSIS — M6281 Muscle weakness (generalized): Secondary | ICD-10-CM | POA: Diagnosis not present

## 2020-08-06 DIAGNOSIS — I629 Nontraumatic intracranial hemorrhage, unspecified: Secondary | ICD-10-CM | POA: Diagnosis not present

## 2020-08-06 DIAGNOSIS — R2689 Other abnormalities of gait and mobility: Secondary | ICD-10-CM | POA: Diagnosis not present

## 2020-08-07 DIAGNOSIS — N186 End stage renal disease: Secondary | ICD-10-CM | POA: Diagnosis not present

## 2020-08-07 DIAGNOSIS — E1122 Type 2 diabetes mellitus with diabetic chronic kidney disease: Secondary | ICD-10-CM | POA: Diagnosis not present

## 2020-08-07 DIAGNOSIS — Z992 Dependence on renal dialysis: Secondary | ICD-10-CM | POA: Diagnosis not present

## 2020-08-08 DIAGNOSIS — D509 Iron deficiency anemia, unspecified: Secondary | ICD-10-CM | POA: Diagnosis not present

## 2020-08-08 DIAGNOSIS — R2681 Unsteadiness on feet: Secondary | ICD-10-CM | POA: Diagnosis not present

## 2020-08-08 DIAGNOSIS — N2581 Secondary hyperparathyroidism of renal origin: Secondary | ICD-10-CM | POA: Diagnosis not present

## 2020-08-08 DIAGNOSIS — D631 Anemia in chronic kidney disease: Secondary | ICD-10-CM | POA: Diagnosis not present

## 2020-08-08 DIAGNOSIS — R2689 Other abnormalities of gait and mobility: Secondary | ICD-10-CM | POA: Diagnosis not present

## 2020-08-08 DIAGNOSIS — N186 End stage renal disease: Secondary | ICD-10-CM | POA: Diagnosis not present

## 2020-08-08 DIAGNOSIS — Z992 Dependence on renal dialysis: Secondary | ICD-10-CM | POA: Diagnosis not present

## 2020-08-08 DIAGNOSIS — M6281 Muscle weakness (generalized): Secondary | ICD-10-CM | POA: Diagnosis not present

## 2020-08-08 DIAGNOSIS — I629 Nontraumatic intracranial hemorrhage, unspecified: Secondary | ICD-10-CM | POA: Diagnosis not present

## 2020-08-09 DIAGNOSIS — I629 Nontraumatic intracranial hemorrhage, unspecified: Secondary | ICD-10-CM | POA: Diagnosis not present

## 2020-08-09 DIAGNOSIS — G459 Transient cerebral ischemic attack, unspecified: Secondary | ICD-10-CM | POA: Diagnosis not present

## 2020-08-09 DIAGNOSIS — G9340 Encephalopathy, unspecified: Secondary | ICD-10-CM | POA: Diagnosis not present

## 2020-08-09 DIAGNOSIS — I679 Cerebrovascular disease, unspecified: Secondary | ICD-10-CM | POA: Diagnosis not present

## 2020-08-09 DIAGNOSIS — M6281 Muscle weakness (generalized): Secondary | ICD-10-CM | POA: Diagnosis not present

## 2020-08-09 DIAGNOSIS — R2681 Unsteadiness on feet: Secondary | ICD-10-CM | POA: Diagnosis not present

## 2020-08-09 DIAGNOSIS — R2689 Other abnormalities of gait and mobility: Secondary | ICD-10-CM | POA: Diagnosis not present

## 2020-08-10 DIAGNOSIS — N186 End stage renal disease: Secondary | ICD-10-CM | POA: Diagnosis not present

## 2020-08-10 DIAGNOSIS — M6281 Muscle weakness (generalized): Secondary | ICD-10-CM | POA: Diagnosis not present

## 2020-08-10 DIAGNOSIS — D631 Anemia in chronic kidney disease: Secondary | ICD-10-CM | POA: Diagnosis not present

## 2020-08-10 DIAGNOSIS — R2689 Other abnormalities of gait and mobility: Secondary | ICD-10-CM | POA: Diagnosis not present

## 2020-08-10 DIAGNOSIS — D509 Iron deficiency anemia, unspecified: Secondary | ICD-10-CM | POA: Diagnosis not present

## 2020-08-10 DIAGNOSIS — N2581 Secondary hyperparathyroidism of renal origin: Secondary | ICD-10-CM | POA: Diagnosis not present

## 2020-08-10 DIAGNOSIS — I629 Nontraumatic intracranial hemorrhage, unspecified: Secondary | ICD-10-CM | POA: Diagnosis not present

## 2020-08-10 DIAGNOSIS — Z992 Dependence on renal dialysis: Secondary | ICD-10-CM | POA: Diagnosis not present

## 2020-08-10 DIAGNOSIS — R2681 Unsteadiness on feet: Secondary | ICD-10-CM | POA: Diagnosis not present

## 2020-08-11 DIAGNOSIS — I629 Nontraumatic intracranial hemorrhage, unspecified: Secondary | ICD-10-CM | POA: Diagnosis not present

## 2020-08-11 DIAGNOSIS — R2681 Unsteadiness on feet: Secondary | ICD-10-CM | POA: Diagnosis not present

## 2020-08-11 DIAGNOSIS — M6281 Muscle weakness (generalized): Secondary | ICD-10-CM | POA: Diagnosis not present

## 2020-08-11 DIAGNOSIS — R2689 Other abnormalities of gait and mobility: Secondary | ICD-10-CM | POA: Diagnosis not present

## 2020-08-12 DIAGNOSIS — D631 Anemia in chronic kidney disease: Secondary | ICD-10-CM | POA: Diagnosis not present

## 2020-08-12 DIAGNOSIS — M6281 Muscle weakness (generalized): Secondary | ICD-10-CM | POA: Diagnosis not present

## 2020-08-12 DIAGNOSIS — R2681 Unsteadiness on feet: Secondary | ICD-10-CM | POA: Diagnosis not present

## 2020-08-12 DIAGNOSIS — Z992 Dependence on renal dialysis: Secondary | ICD-10-CM | POA: Diagnosis not present

## 2020-08-12 DIAGNOSIS — D509 Iron deficiency anemia, unspecified: Secondary | ICD-10-CM | POA: Diagnosis not present

## 2020-08-12 DIAGNOSIS — I629 Nontraumatic intracranial hemorrhage, unspecified: Secondary | ICD-10-CM | POA: Diagnosis not present

## 2020-08-12 DIAGNOSIS — R2689 Other abnormalities of gait and mobility: Secondary | ICD-10-CM | POA: Diagnosis not present

## 2020-08-12 DIAGNOSIS — N2581 Secondary hyperparathyroidism of renal origin: Secondary | ICD-10-CM | POA: Diagnosis not present

## 2020-08-12 DIAGNOSIS — N186 End stage renal disease: Secondary | ICD-10-CM | POA: Diagnosis not present

## 2020-08-15 DIAGNOSIS — R2681 Unsteadiness on feet: Secondary | ICD-10-CM | POA: Diagnosis not present

## 2020-08-15 DIAGNOSIS — N186 End stage renal disease: Secondary | ICD-10-CM | POA: Diagnosis not present

## 2020-08-15 DIAGNOSIS — M6281 Muscle weakness (generalized): Secondary | ICD-10-CM | POA: Diagnosis not present

## 2020-08-15 DIAGNOSIS — Z992 Dependence on renal dialysis: Secondary | ICD-10-CM | POA: Diagnosis not present

## 2020-08-15 DIAGNOSIS — I629 Nontraumatic intracranial hemorrhage, unspecified: Secondary | ICD-10-CM | POA: Diagnosis not present

## 2020-08-15 DIAGNOSIS — D509 Iron deficiency anemia, unspecified: Secondary | ICD-10-CM | POA: Diagnosis not present

## 2020-08-15 DIAGNOSIS — R2689 Other abnormalities of gait and mobility: Secondary | ICD-10-CM | POA: Diagnosis not present

## 2020-08-15 DIAGNOSIS — N2581 Secondary hyperparathyroidism of renal origin: Secondary | ICD-10-CM | POA: Diagnosis not present

## 2020-08-15 DIAGNOSIS — D631 Anemia in chronic kidney disease: Secondary | ICD-10-CM | POA: Diagnosis not present

## 2020-08-16 DIAGNOSIS — E113513 Type 2 diabetes mellitus with proliferative diabetic retinopathy with macular edema, bilateral: Secondary | ICD-10-CM | POA: Diagnosis not present

## 2020-08-16 DIAGNOSIS — H2513 Age-related nuclear cataract, bilateral: Secondary | ICD-10-CM | POA: Diagnosis not present

## 2020-08-17 DIAGNOSIS — N186 End stage renal disease: Secondary | ICD-10-CM | POA: Diagnosis not present

## 2020-08-17 DIAGNOSIS — D631 Anemia in chronic kidney disease: Secondary | ICD-10-CM | POA: Diagnosis not present

## 2020-08-17 DIAGNOSIS — Z992 Dependence on renal dialysis: Secondary | ICD-10-CM | POA: Diagnosis not present

## 2020-08-17 DIAGNOSIS — N2581 Secondary hyperparathyroidism of renal origin: Secondary | ICD-10-CM | POA: Diagnosis not present

## 2020-08-17 DIAGNOSIS — D509 Iron deficiency anemia, unspecified: Secondary | ICD-10-CM | POA: Diagnosis not present

## 2020-08-18 DIAGNOSIS — Z79899 Other long term (current) drug therapy: Secondary | ICD-10-CM | POA: Diagnosis not present

## 2020-08-18 DIAGNOSIS — Z992 Dependence on renal dialysis: Secondary | ICD-10-CM | POA: Diagnosis not present

## 2020-08-18 DIAGNOSIS — E1122 Type 2 diabetes mellitus with diabetic chronic kidney disease: Secondary | ICD-10-CM | POA: Diagnosis not present

## 2020-08-18 DIAGNOSIS — G459 Transient cerebral ischemic attack, unspecified: Secondary | ICD-10-CM | POA: Diagnosis not present

## 2020-08-18 DIAGNOSIS — I10 Essential (primary) hypertension: Secondary | ICD-10-CM | POA: Diagnosis not present

## 2020-08-18 DIAGNOSIS — G629 Polyneuropathy, unspecified: Secondary | ICD-10-CM | POA: Diagnosis not present

## 2020-08-19 DIAGNOSIS — N2581 Secondary hyperparathyroidism of renal origin: Secondary | ICD-10-CM | POA: Diagnosis not present

## 2020-08-19 DIAGNOSIS — Z992 Dependence on renal dialysis: Secondary | ICD-10-CM | POA: Diagnosis not present

## 2020-08-19 DIAGNOSIS — N186 End stage renal disease: Secondary | ICD-10-CM | POA: Diagnosis not present

## 2020-08-19 DIAGNOSIS — D509 Iron deficiency anemia, unspecified: Secondary | ICD-10-CM | POA: Diagnosis not present

## 2020-08-19 DIAGNOSIS — D631 Anemia in chronic kidney disease: Secondary | ICD-10-CM | POA: Diagnosis not present

## 2020-08-22 DIAGNOSIS — Z992 Dependence on renal dialysis: Secondary | ICD-10-CM | POA: Diagnosis not present

## 2020-08-22 DIAGNOSIS — N2581 Secondary hyperparathyroidism of renal origin: Secondary | ICD-10-CM | POA: Diagnosis not present

## 2020-08-22 DIAGNOSIS — D631 Anemia in chronic kidney disease: Secondary | ICD-10-CM | POA: Diagnosis not present

## 2020-08-22 DIAGNOSIS — D509 Iron deficiency anemia, unspecified: Secondary | ICD-10-CM | POA: Diagnosis not present

## 2020-08-22 DIAGNOSIS — N186 End stage renal disease: Secondary | ICD-10-CM | POA: Diagnosis not present

## 2020-08-24 DIAGNOSIS — N2581 Secondary hyperparathyroidism of renal origin: Secondary | ICD-10-CM | POA: Diagnosis not present

## 2020-08-24 DIAGNOSIS — D509 Iron deficiency anemia, unspecified: Secondary | ICD-10-CM | POA: Diagnosis not present

## 2020-08-24 DIAGNOSIS — N186 End stage renal disease: Secondary | ICD-10-CM | POA: Diagnosis not present

## 2020-08-24 DIAGNOSIS — Z992 Dependence on renal dialysis: Secondary | ICD-10-CM | POA: Diagnosis not present

## 2020-08-24 DIAGNOSIS — D631 Anemia in chronic kidney disease: Secondary | ICD-10-CM | POA: Diagnosis not present

## 2020-08-25 ENCOUNTER — Other Ambulatory Visit: Payer: Self-pay

## 2020-08-25 ENCOUNTER — Encounter (INDEPENDENT_AMBULATORY_CARE_PROVIDER_SITE_OTHER): Payer: Medicare Other | Admitting: Ophthalmology

## 2020-08-25 DIAGNOSIS — H35033 Hypertensive retinopathy, bilateral: Secondary | ICD-10-CM | POA: Diagnosis not present

## 2020-08-25 DIAGNOSIS — E113513 Type 2 diabetes mellitus with proliferative diabetic retinopathy with macular edema, bilateral: Secondary | ICD-10-CM

## 2020-08-25 DIAGNOSIS — I10 Essential (primary) hypertension: Secondary | ICD-10-CM

## 2020-08-25 DIAGNOSIS — H43813 Vitreous degeneration, bilateral: Secondary | ICD-10-CM | POA: Diagnosis not present

## 2020-08-26 DIAGNOSIS — D631 Anemia in chronic kidney disease: Secondary | ICD-10-CM | POA: Diagnosis not present

## 2020-08-26 DIAGNOSIS — N2581 Secondary hyperparathyroidism of renal origin: Secondary | ICD-10-CM | POA: Diagnosis not present

## 2020-08-26 DIAGNOSIS — N186 End stage renal disease: Secondary | ICD-10-CM | POA: Diagnosis not present

## 2020-08-26 DIAGNOSIS — D509 Iron deficiency anemia, unspecified: Secondary | ICD-10-CM | POA: Diagnosis not present

## 2020-08-26 DIAGNOSIS — Z992 Dependence on renal dialysis: Secondary | ICD-10-CM | POA: Diagnosis not present

## 2020-08-29 DIAGNOSIS — N186 End stage renal disease: Secondary | ICD-10-CM | POA: Diagnosis not present

## 2020-08-29 DIAGNOSIS — N2581 Secondary hyperparathyroidism of renal origin: Secondary | ICD-10-CM | POA: Diagnosis not present

## 2020-08-29 DIAGNOSIS — D509 Iron deficiency anemia, unspecified: Secondary | ICD-10-CM | POA: Diagnosis not present

## 2020-08-29 DIAGNOSIS — Z992 Dependence on renal dialysis: Secondary | ICD-10-CM | POA: Diagnosis not present

## 2020-08-29 DIAGNOSIS — D631 Anemia in chronic kidney disease: Secondary | ICD-10-CM | POA: Diagnosis not present

## 2020-08-30 DIAGNOSIS — E113312 Type 2 diabetes mellitus with moderate nonproliferative diabetic retinopathy with macular edema, left eye: Secondary | ICD-10-CM | POA: Diagnosis not present

## 2020-08-30 DIAGNOSIS — E113391 Type 2 diabetes mellitus with moderate nonproliferative diabetic retinopathy without macular edema, right eye: Secondary | ICD-10-CM | POA: Diagnosis not present

## 2020-08-30 DIAGNOSIS — H31093 Other chorioretinal scars, bilateral: Secondary | ICD-10-CM | POA: Diagnosis not present

## 2020-08-30 DIAGNOSIS — H90A22 Sensorineural hearing loss, unilateral, left ear, with restricted hearing on the contralateral side: Secondary | ICD-10-CM | POA: Diagnosis not present

## 2020-08-30 DIAGNOSIS — H25813 Combined forms of age-related cataract, bilateral: Secondary | ICD-10-CM | POA: Diagnosis not present

## 2020-08-31 DIAGNOSIS — Z992 Dependence on renal dialysis: Secondary | ICD-10-CM | POA: Diagnosis not present

## 2020-08-31 DIAGNOSIS — D509 Iron deficiency anemia, unspecified: Secondary | ICD-10-CM | POA: Diagnosis not present

## 2020-08-31 DIAGNOSIS — N186 End stage renal disease: Secondary | ICD-10-CM | POA: Diagnosis not present

## 2020-08-31 DIAGNOSIS — N2581 Secondary hyperparathyroidism of renal origin: Secondary | ICD-10-CM | POA: Diagnosis not present

## 2020-08-31 DIAGNOSIS — D631 Anemia in chronic kidney disease: Secondary | ICD-10-CM | POA: Diagnosis not present

## 2020-09-01 DIAGNOSIS — M7989 Other specified soft tissue disorders: Secondary | ICD-10-CM | POA: Diagnosis not present

## 2020-09-01 DIAGNOSIS — M25432 Effusion, left wrist: Secondary | ICD-10-CM | POA: Diagnosis not present

## 2020-09-02 DIAGNOSIS — D631 Anemia in chronic kidney disease: Secondary | ICD-10-CM | POA: Diagnosis not present

## 2020-09-02 DIAGNOSIS — N186 End stage renal disease: Secondary | ICD-10-CM | POA: Diagnosis not present

## 2020-09-02 DIAGNOSIS — Z992 Dependence on renal dialysis: Secondary | ICD-10-CM | POA: Diagnosis not present

## 2020-09-02 DIAGNOSIS — N2581 Secondary hyperparathyroidism of renal origin: Secondary | ICD-10-CM | POA: Diagnosis not present

## 2020-09-02 DIAGNOSIS — M19042 Primary osteoarthritis, left hand: Secondary | ICD-10-CM | POA: Diagnosis not present

## 2020-09-02 DIAGNOSIS — D509 Iron deficiency anemia, unspecified: Secondary | ICD-10-CM | POA: Diagnosis not present

## 2020-09-03 DIAGNOSIS — E118 Type 2 diabetes mellitus with unspecified complications: Secondary | ICD-10-CM | POA: Diagnosis not present

## 2020-09-05 DIAGNOSIS — N2581 Secondary hyperparathyroidism of renal origin: Secondary | ICD-10-CM | POA: Diagnosis not present

## 2020-09-05 DIAGNOSIS — D631 Anemia in chronic kidney disease: Secondary | ICD-10-CM | POA: Diagnosis not present

## 2020-09-05 DIAGNOSIS — N186 End stage renal disease: Secondary | ICD-10-CM | POA: Diagnosis not present

## 2020-09-05 DIAGNOSIS — D509 Iron deficiency anemia, unspecified: Secondary | ICD-10-CM | POA: Diagnosis not present

## 2020-09-05 DIAGNOSIS — Z992 Dependence on renal dialysis: Secondary | ICD-10-CM | POA: Diagnosis not present

## 2020-09-07 DIAGNOSIS — D509 Iron deficiency anemia, unspecified: Secondary | ICD-10-CM | POA: Diagnosis not present

## 2020-09-07 DIAGNOSIS — E1122 Type 2 diabetes mellitus with diabetic chronic kidney disease: Secondary | ICD-10-CM | POA: Diagnosis not present

## 2020-09-07 DIAGNOSIS — N2581 Secondary hyperparathyroidism of renal origin: Secondary | ICD-10-CM | POA: Diagnosis not present

## 2020-09-07 DIAGNOSIS — Z992 Dependence on renal dialysis: Secondary | ICD-10-CM | POA: Diagnosis not present

## 2020-09-07 DIAGNOSIS — N186 End stage renal disease: Secondary | ICD-10-CM | POA: Diagnosis not present

## 2020-09-07 DIAGNOSIS — D631 Anemia in chronic kidney disease: Secondary | ICD-10-CM | POA: Diagnosis not present

## 2020-09-08 DIAGNOSIS — N186 End stage renal disease: Secondary | ICD-10-CM | POA: Diagnosis not present

## 2020-09-08 DIAGNOSIS — E1122 Type 2 diabetes mellitus with diabetic chronic kidney disease: Secondary | ICD-10-CM | POA: Diagnosis not present

## 2020-09-08 DIAGNOSIS — K3184 Gastroparesis: Secondary | ICD-10-CM | POA: Diagnosis not present

## 2020-09-08 DIAGNOSIS — I10 Essential (primary) hypertension: Secondary | ICD-10-CM | POA: Diagnosis not present

## 2020-09-08 DIAGNOSIS — M79642 Pain in left hand: Secondary | ICD-10-CM | POA: Diagnosis not present

## 2020-09-09 DIAGNOSIS — E1122 Type 2 diabetes mellitus with diabetic chronic kidney disease: Secondary | ICD-10-CM | POA: Diagnosis not present

## 2020-09-09 DIAGNOSIS — N2581 Secondary hyperparathyroidism of renal origin: Secondary | ICD-10-CM | POA: Diagnosis not present

## 2020-09-09 DIAGNOSIS — L299 Pruritus, unspecified: Secondary | ICD-10-CM | POA: Diagnosis not present

## 2020-09-09 DIAGNOSIS — Z992 Dependence on renal dialysis: Secondary | ICD-10-CM | POA: Diagnosis not present

## 2020-09-09 DIAGNOSIS — N186 End stage renal disease: Secondary | ICD-10-CM | POA: Diagnosis not present

## 2020-09-12 DIAGNOSIS — N186 End stage renal disease: Secondary | ICD-10-CM | POA: Diagnosis not present

## 2020-09-12 DIAGNOSIS — E1122 Type 2 diabetes mellitus with diabetic chronic kidney disease: Secondary | ICD-10-CM | POA: Diagnosis not present

## 2020-09-12 DIAGNOSIS — L299 Pruritus, unspecified: Secondary | ICD-10-CM | POA: Diagnosis not present

## 2020-09-12 DIAGNOSIS — N2581 Secondary hyperparathyroidism of renal origin: Secondary | ICD-10-CM | POA: Diagnosis not present

## 2020-09-12 DIAGNOSIS — Z992 Dependence on renal dialysis: Secondary | ICD-10-CM | POA: Diagnosis not present

## 2020-09-14 DIAGNOSIS — L299 Pruritus, unspecified: Secondary | ICD-10-CM | POA: Diagnosis not present

## 2020-09-14 DIAGNOSIS — N2581 Secondary hyperparathyroidism of renal origin: Secondary | ICD-10-CM | POA: Diagnosis not present

## 2020-09-14 DIAGNOSIS — E1122 Type 2 diabetes mellitus with diabetic chronic kidney disease: Secondary | ICD-10-CM | POA: Diagnosis not present

## 2020-09-14 DIAGNOSIS — Z992 Dependence on renal dialysis: Secondary | ICD-10-CM | POA: Diagnosis not present

## 2020-09-14 DIAGNOSIS — N186 End stage renal disease: Secondary | ICD-10-CM | POA: Diagnosis not present

## 2020-09-15 DIAGNOSIS — M7989 Other specified soft tissue disorders: Secondary | ICD-10-CM | POA: Diagnosis not present

## 2020-09-15 DIAGNOSIS — Z79899 Other long term (current) drug therapy: Secondary | ICD-10-CM | POA: Diagnosis not present

## 2020-09-16 DIAGNOSIS — Z992 Dependence on renal dialysis: Secondary | ICD-10-CM | POA: Diagnosis not present

## 2020-09-16 DIAGNOSIS — N186 End stage renal disease: Secondary | ICD-10-CM | POA: Diagnosis not present

## 2020-09-16 DIAGNOSIS — E1122 Type 2 diabetes mellitus with diabetic chronic kidney disease: Secondary | ICD-10-CM | POA: Diagnosis not present

## 2020-09-16 DIAGNOSIS — L299 Pruritus, unspecified: Secondary | ICD-10-CM | POA: Diagnosis not present

## 2020-09-16 DIAGNOSIS — N2581 Secondary hyperparathyroidism of renal origin: Secondary | ICD-10-CM | POA: Diagnosis not present

## 2020-09-19 DIAGNOSIS — N2581 Secondary hyperparathyroidism of renal origin: Secondary | ICD-10-CM | POA: Diagnosis not present

## 2020-09-19 DIAGNOSIS — Z992 Dependence on renal dialysis: Secondary | ICD-10-CM | POA: Diagnosis not present

## 2020-09-19 DIAGNOSIS — E1122 Type 2 diabetes mellitus with diabetic chronic kidney disease: Secondary | ICD-10-CM | POA: Diagnosis not present

## 2020-09-19 DIAGNOSIS — L299 Pruritus, unspecified: Secondary | ICD-10-CM | POA: Diagnosis not present

## 2020-09-19 DIAGNOSIS — N186 End stage renal disease: Secondary | ICD-10-CM | POA: Diagnosis not present

## 2020-09-21 DIAGNOSIS — N2581 Secondary hyperparathyroidism of renal origin: Secondary | ICD-10-CM | POA: Diagnosis not present

## 2020-09-21 DIAGNOSIS — E1122 Type 2 diabetes mellitus with diabetic chronic kidney disease: Secondary | ICD-10-CM | POA: Diagnosis not present

## 2020-09-21 DIAGNOSIS — Z992 Dependence on renal dialysis: Secondary | ICD-10-CM | POA: Diagnosis not present

## 2020-09-21 DIAGNOSIS — L299 Pruritus, unspecified: Secondary | ICD-10-CM | POA: Diagnosis not present

## 2020-09-21 DIAGNOSIS — N186 End stage renal disease: Secondary | ICD-10-CM | POA: Diagnosis not present

## 2020-09-22 ENCOUNTER — Encounter (INDEPENDENT_AMBULATORY_CARE_PROVIDER_SITE_OTHER): Payer: Medicare Other | Admitting: Ophthalmology

## 2020-09-22 ENCOUNTER — Other Ambulatory Visit: Payer: Self-pay

## 2020-09-22 DIAGNOSIS — E113591 Type 2 diabetes mellitus with proliferative diabetic retinopathy without macular edema, right eye: Secondary | ICD-10-CM | POA: Diagnosis not present

## 2020-09-22 DIAGNOSIS — I10 Essential (primary) hypertension: Secondary | ICD-10-CM

## 2020-09-22 DIAGNOSIS — H35033 Hypertensive retinopathy, bilateral: Secondary | ICD-10-CM | POA: Diagnosis not present

## 2020-09-22 DIAGNOSIS — E113512 Type 2 diabetes mellitus with proliferative diabetic retinopathy with macular edema, left eye: Secondary | ICD-10-CM | POA: Diagnosis not present

## 2020-09-22 DIAGNOSIS — H43813 Vitreous degeneration, bilateral: Secondary | ICD-10-CM | POA: Diagnosis not present

## 2020-09-23 DIAGNOSIS — N2581 Secondary hyperparathyroidism of renal origin: Secondary | ICD-10-CM | POA: Diagnosis not present

## 2020-09-23 DIAGNOSIS — L299 Pruritus, unspecified: Secondary | ICD-10-CM | POA: Diagnosis not present

## 2020-09-23 DIAGNOSIS — N186 End stage renal disease: Secondary | ICD-10-CM | POA: Diagnosis not present

## 2020-09-23 DIAGNOSIS — Z992 Dependence on renal dialysis: Secondary | ICD-10-CM | POA: Diagnosis not present

## 2020-09-23 DIAGNOSIS — E1122 Type 2 diabetes mellitus with diabetic chronic kidney disease: Secondary | ICD-10-CM | POA: Diagnosis not present

## 2020-09-26 DIAGNOSIS — L299 Pruritus, unspecified: Secondary | ICD-10-CM | POA: Diagnosis not present

## 2020-09-26 DIAGNOSIS — N186 End stage renal disease: Secondary | ICD-10-CM | POA: Diagnosis not present

## 2020-09-26 DIAGNOSIS — Z992 Dependence on renal dialysis: Secondary | ICD-10-CM | POA: Diagnosis not present

## 2020-09-26 DIAGNOSIS — N2581 Secondary hyperparathyroidism of renal origin: Secondary | ICD-10-CM | POA: Diagnosis not present

## 2020-09-26 DIAGNOSIS — E1122 Type 2 diabetes mellitus with diabetic chronic kidney disease: Secondary | ICD-10-CM | POA: Diagnosis not present

## 2020-09-28 DIAGNOSIS — N186 End stage renal disease: Secondary | ICD-10-CM | POA: Diagnosis not present

## 2020-09-28 DIAGNOSIS — N2581 Secondary hyperparathyroidism of renal origin: Secondary | ICD-10-CM | POA: Diagnosis not present

## 2020-09-28 DIAGNOSIS — L299 Pruritus, unspecified: Secondary | ICD-10-CM | POA: Diagnosis not present

## 2020-09-28 DIAGNOSIS — E1122 Type 2 diabetes mellitus with diabetic chronic kidney disease: Secondary | ICD-10-CM | POA: Diagnosis not present

## 2020-09-28 DIAGNOSIS — Z992 Dependence on renal dialysis: Secondary | ICD-10-CM | POA: Diagnosis not present

## 2020-09-29 DIAGNOSIS — K649 Unspecified hemorrhoids: Secondary | ICD-10-CM | POA: Diagnosis not present

## 2020-09-29 DIAGNOSIS — I10 Essential (primary) hypertension: Secondary | ICD-10-CM | POA: Diagnosis not present

## 2020-09-30 DIAGNOSIS — Z992 Dependence on renal dialysis: Secondary | ICD-10-CM | POA: Diagnosis not present

## 2020-09-30 DIAGNOSIS — N2581 Secondary hyperparathyroidism of renal origin: Secondary | ICD-10-CM | POA: Diagnosis not present

## 2020-09-30 DIAGNOSIS — N186 End stage renal disease: Secondary | ICD-10-CM | POA: Diagnosis not present

## 2020-09-30 DIAGNOSIS — L299 Pruritus, unspecified: Secondary | ICD-10-CM | POA: Diagnosis not present

## 2020-09-30 DIAGNOSIS — E1122 Type 2 diabetes mellitus with diabetic chronic kidney disease: Secondary | ICD-10-CM | POA: Diagnosis not present

## 2020-10-03 DIAGNOSIS — N2581 Secondary hyperparathyroidism of renal origin: Secondary | ICD-10-CM | POA: Diagnosis not present

## 2020-10-03 DIAGNOSIS — N186 End stage renal disease: Secondary | ICD-10-CM | POA: Diagnosis not present

## 2020-10-03 DIAGNOSIS — Z992 Dependence on renal dialysis: Secondary | ICD-10-CM | POA: Diagnosis not present

## 2020-10-03 DIAGNOSIS — L299 Pruritus, unspecified: Secondary | ICD-10-CM | POA: Diagnosis not present

## 2020-10-03 DIAGNOSIS — E1122 Type 2 diabetes mellitus with diabetic chronic kidney disease: Secondary | ICD-10-CM | POA: Diagnosis not present

## 2020-10-05 DIAGNOSIS — L299 Pruritus, unspecified: Secondary | ICD-10-CM | POA: Diagnosis not present

## 2020-10-05 DIAGNOSIS — N186 End stage renal disease: Secondary | ICD-10-CM | POA: Diagnosis not present

## 2020-10-05 DIAGNOSIS — E1122 Type 2 diabetes mellitus with diabetic chronic kidney disease: Secondary | ICD-10-CM | POA: Diagnosis not present

## 2020-10-05 DIAGNOSIS — Z992 Dependence on renal dialysis: Secondary | ICD-10-CM | POA: Diagnosis not present

## 2020-10-05 DIAGNOSIS — N2581 Secondary hyperparathyroidism of renal origin: Secondary | ICD-10-CM | POA: Diagnosis not present

## 2020-10-07 DIAGNOSIS — L299 Pruritus, unspecified: Secondary | ICD-10-CM | POA: Diagnosis not present

## 2020-10-07 DIAGNOSIS — E1122 Type 2 diabetes mellitus with diabetic chronic kidney disease: Secondary | ICD-10-CM | POA: Diagnosis not present

## 2020-10-07 DIAGNOSIS — Z992 Dependence on renal dialysis: Secondary | ICD-10-CM | POA: Diagnosis not present

## 2020-10-07 DIAGNOSIS — N186 End stage renal disease: Secondary | ICD-10-CM | POA: Diagnosis not present

## 2020-10-07 DIAGNOSIS — N2581 Secondary hyperparathyroidism of renal origin: Secondary | ICD-10-CM | POA: Diagnosis not present

## 2020-10-10 DIAGNOSIS — N186 End stage renal disease: Secondary | ICD-10-CM | POA: Diagnosis not present

## 2020-10-10 DIAGNOSIS — E1122 Type 2 diabetes mellitus with diabetic chronic kidney disease: Secondary | ICD-10-CM | POA: Diagnosis not present

## 2020-10-10 DIAGNOSIS — D631 Anemia in chronic kidney disease: Secondary | ICD-10-CM | POA: Diagnosis not present

## 2020-10-10 DIAGNOSIS — N2581 Secondary hyperparathyroidism of renal origin: Secondary | ICD-10-CM | POA: Diagnosis not present

## 2020-10-10 DIAGNOSIS — Z992 Dependence on renal dialysis: Secondary | ICD-10-CM | POA: Diagnosis not present

## 2020-10-10 DIAGNOSIS — L299 Pruritus, unspecified: Secondary | ICD-10-CM | POA: Diagnosis not present

## 2020-10-10 DIAGNOSIS — Z23 Encounter for immunization: Secondary | ICD-10-CM | POA: Diagnosis not present

## 2020-10-12 DIAGNOSIS — L299 Pruritus, unspecified: Secondary | ICD-10-CM | POA: Diagnosis not present

## 2020-10-12 DIAGNOSIS — E1122 Type 2 diabetes mellitus with diabetic chronic kidney disease: Secondary | ICD-10-CM | POA: Diagnosis not present

## 2020-10-12 DIAGNOSIS — Z992 Dependence on renal dialysis: Secondary | ICD-10-CM | POA: Diagnosis not present

## 2020-10-12 DIAGNOSIS — N186 End stage renal disease: Secondary | ICD-10-CM | POA: Diagnosis not present

## 2020-10-12 DIAGNOSIS — Z23 Encounter for immunization: Secondary | ICD-10-CM | POA: Diagnosis not present

## 2020-10-12 DIAGNOSIS — D631 Anemia in chronic kidney disease: Secondary | ICD-10-CM | POA: Diagnosis not present

## 2020-10-12 DIAGNOSIS — N2581 Secondary hyperparathyroidism of renal origin: Secondary | ICD-10-CM | POA: Diagnosis not present

## 2020-10-13 DIAGNOSIS — I679 Cerebrovascular disease, unspecified: Secondary | ICD-10-CM | POA: Diagnosis not present

## 2020-10-13 DIAGNOSIS — E785 Hyperlipidemia, unspecified: Secondary | ICD-10-CM | POA: Diagnosis not present

## 2020-10-14 DIAGNOSIS — Z992 Dependence on renal dialysis: Secondary | ICD-10-CM | POA: Diagnosis not present

## 2020-10-14 DIAGNOSIS — L299 Pruritus, unspecified: Secondary | ICD-10-CM | POA: Diagnosis not present

## 2020-10-14 DIAGNOSIS — N2581 Secondary hyperparathyroidism of renal origin: Secondary | ICD-10-CM | POA: Diagnosis not present

## 2020-10-14 DIAGNOSIS — N186 End stage renal disease: Secondary | ICD-10-CM | POA: Diagnosis not present

## 2020-10-14 DIAGNOSIS — D631 Anemia in chronic kidney disease: Secondary | ICD-10-CM | POA: Diagnosis not present

## 2020-10-14 DIAGNOSIS — Z23 Encounter for immunization: Secondary | ICD-10-CM | POA: Diagnosis not present

## 2020-10-14 DIAGNOSIS — E1122 Type 2 diabetes mellitus with diabetic chronic kidney disease: Secondary | ICD-10-CM | POA: Diagnosis not present

## 2020-10-16 DIAGNOSIS — Z23 Encounter for immunization: Secondary | ICD-10-CM | POA: Diagnosis not present

## 2020-10-17 DIAGNOSIS — D631 Anemia in chronic kidney disease: Secondary | ICD-10-CM | POA: Diagnosis not present

## 2020-10-17 DIAGNOSIS — Z23 Encounter for immunization: Secondary | ICD-10-CM | POA: Diagnosis not present

## 2020-10-17 DIAGNOSIS — N186 End stage renal disease: Secondary | ICD-10-CM | POA: Diagnosis not present

## 2020-10-17 DIAGNOSIS — N2581 Secondary hyperparathyroidism of renal origin: Secondary | ICD-10-CM | POA: Diagnosis not present

## 2020-10-17 DIAGNOSIS — L299 Pruritus, unspecified: Secondary | ICD-10-CM | POA: Diagnosis not present

## 2020-10-17 DIAGNOSIS — E1122 Type 2 diabetes mellitus with diabetic chronic kidney disease: Secondary | ICD-10-CM | POA: Diagnosis not present

## 2020-10-17 DIAGNOSIS — Z992 Dependence on renal dialysis: Secondary | ICD-10-CM | POA: Diagnosis not present

## 2020-10-19 DIAGNOSIS — L299 Pruritus, unspecified: Secondary | ICD-10-CM | POA: Diagnosis not present

## 2020-10-19 DIAGNOSIS — Z23 Encounter for immunization: Secondary | ICD-10-CM | POA: Diagnosis not present

## 2020-10-19 DIAGNOSIS — N2581 Secondary hyperparathyroidism of renal origin: Secondary | ICD-10-CM | POA: Diagnosis not present

## 2020-10-19 DIAGNOSIS — N186 End stage renal disease: Secondary | ICD-10-CM | POA: Diagnosis not present

## 2020-10-19 DIAGNOSIS — E1122 Type 2 diabetes mellitus with diabetic chronic kidney disease: Secondary | ICD-10-CM | POA: Diagnosis not present

## 2020-10-19 DIAGNOSIS — Z992 Dependence on renal dialysis: Secondary | ICD-10-CM | POA: Diagnosis not present

## 2020-10-19 DIAGNOSIS — D631 Anemia in chronic kidney disease: Secondary | ICD-10-CM | POA: Diagnosis not present

## 2020-10-20 ENCOUNTER — Other Ambulatory Visit: Payer: Self-pay

## 2020-10-20 ENCOUNTER — Encounter (INDEPENDENT_AMBULATORY_CARE_PROVIDER_SITE_OTHER): Payer: Medicare Other | Admitting: Ophthalmology

## 2020-10-20 DIAGNOSIS — E113513 Type 2 diabetes mellitus with proliferative diabetic retinopathy with macular edema, bilateral: Secondary | ICD-10-CM

## 2020-10-20 DIAGNOSIS — I10 Essential (primary) hypertension: Secondary | ICD-10-CM | POA: Diagnosis not present

## 2020-10-20 DIAGNOSIS — H43813 Vitreous degeneration, bilateral: Secondary | ICD-10-CM | POA: Diagnosis not present

## 2020-10-20 DIAGNOSIS — H35033 Hypertensive retinopathy, bilateral: Secondary | ICD-10-CM | POA: Diagnosis not present

## 2020-10-21 DIAGNOSIS — D631 Anemia in chronic kidney disease: Secondary | ICD-10-CM | POA: Diagnosis not present

## 2020-10-21 DIAGNOSIS — Z992 Dependence on renal dialysis: Secondary | ICD-10-CM | POA: Diagnosis not present

## 2020-10-21 DIAGNOSIS — E1122 Type 2 diabetes mellitus with diabetic chronic kidney disease: Secondary | ICD-10-CM | POA: Diagnosis not present

## 2020-10-21 DIAGNOSIS — N2581 Secondary hyperparathyroidism of renal origin: Secondary | ICD-10-CM | POA: Diagnosis not present

## 2020-10-21 DIAGNOSIS — Z23 Encounter for immunization: Secondary | ICD-10-CM | POA: Diagnosis not present

## 2020-10-21 DIAGNOSIS — L299 Pruritus, unspecified: Secondary | ICD-10-CM | POA: Diagnosis not present

## 2020-10-21 DIAGNOSIS — N186 End stage renal disease: Secondary | ICD-10-CM | POA: Diagnosis not present

## 2020-10-24 DIAGNOSIS — Z992 Dependence on renal dialysis: Secondary | ICD-10-CM | POA: Diagnosis not present

## 2020-10-24 DIAGNOSIS — L299 Pruritus, unspecified: Secondary | ICD-10-CM | POA: Diagnosis not present

## 2020-10-24 DIAGNOSIS — D631 Anemia in chronic kidney disease: Secondary | ICD-10-CM | POA: Diagnosis not present

## 2020-10-24 DIAGNOSIS — N2581 Secondary hyperparathyroidism of renal origin: Secondary | ICD-10-CM | POA: Diagnosis not present

## 2020-10-24 DIAGNOSIS — N186 End stage renal disease: Secondary | ICD-10-CM | POA: Diagnosis not present

## 2020-10-24 DIAGNOSIS — Z23 Encounter for immunization: Secondary | ICD-10-CM | POA: Diagnosis not present

## 2020-10-24 DIAGNOSIS — E1122 Type 2 diabetes mellitus with diabetic chronic kidney disease: Secondary | ICD-10-CM | POA: Diagnosis not present

## 2020-10-26 DIAGNOSIS — N2581 Secondary hyperparathyroidism of renal origin: Secondary | ICD-10-CM | POA: Diagnosis not present

## 2020-10-26 DIAGNOSIS — E1122 Type 2 diabetes mellitus with diabetic chronic kidney disease: Secondary | ICD-10-CM | POA: Diagnosis not present

## 2020-10-26 DIAGNOSIS — L299 Pruritus, unspecified: Secondary | ICD-10-CM | POA: Diagnosis not present

## 2020-10-26 DIAGNOSIS — D631 Anemia in chronic kidney disease: Secondary | ICD-10-CM | POA: Diagnosis not present

## 2020-10-26 DIAGNOSIS — N186 End stage renal disease: Secondary | ICD-10-CM | POA: Diagnosis not present

## 2020-10-26 DIAGNOSIS — Z23 Encounter for immunization: Secondary | ICD-10-CM | POA: Diagnosis not present

## 2020-10-26 DIAGNOSIS — Z992 Dependence on renal dialysis: Secondary | ICD-10-CM | POA: Diagnosis not present

## 2020-10-28 DIAGNOSIS — L299 Pruritus, unspecified: Secondary | ICD-10-CM | POA: Diagnosis not present

## 2020-10-28 DIAGNOSIS — Z992 Dependence on renal dialysis: Secondary | ICD-10-CM | POA: Diagnosis not present

## 2020-10-28 DIAGNOSIS — N186 End stage renal disease: Secondary | ICD-10-CM | POA: Diagnosis not present

## 2020-10-28 DIAGNOSIS — D631 Anemia in chronic kidney disease: Secondary | ICD-10-CM | POA: Diagnosis not present

## 2020-10-28 DIAGNOSIS — E1122 Type 2 diabetes mellitus with diabetic chronic kidney disease: Secondary | ICD-10-CM | POA: Diagnosis not present

## 2020-10-28 DIAGNOSIS — N2581 Secondary hyperparathyroidism of renal origin: Secondary | ICD-10-CM | POA: Diagnosis not present

## 2020-10-28 DIAGNOSIS — Z23 Encounter for immunization: Secondary | ICD-10-CM | POA: Diagnosis not present

## 2020-10-31 DIAGNOSIS — E1122 Type 2 diabetes mellitus with diabetic chronic kidney disease: Secondary | ICD-10-CM | POA: Diagnosis not present

## 2020-10-31 DIAGNOSIS — L299 Pruritus, unspecified: Secondary | ICD-10-CM | POA: Diagnosis not present

## 2020-10-31 DIAGNOSIS — D631 Anemia in chronic kidney disease: Secondary | ICD-10-CM | POA: Diagnosis not present

## 2020-10-31 DIAGNOSIS — Z992 Dependence on renal dialysis: Secondary | ICD-10-CM | POA: Diagnosis not present

## 2020-10-31 DIAGNOSIS — N186 End stage renal disease: Secondary | ICD-10-CM | POA: Diagnosis not present

## 2020-10-31 DIAGNOSIS — N2581 Secondary hyperparathyroidism of renal origin: Secondary | ICD-10-CM | POA: Diagnosis not present

## 2020-10-31 DIAGNOSIS — Z23 Encounter for immunization: Secondary | ICD-10-CM | POA: Diagnosis not present

## 2020-11-02 DIAGNOSIS — E1122 Type 2 diabetes mellitus with diabetic chronic kidney disease: Secondary | ICD-10-CM | POA: Diagnosis not present

## 2020-11-02 DIAGNOSIS — N186 End stage renal disease: Secondary | ICD-10-CM | POA: Diagnosis not present

## 2020-11-02 DIAGNOSIS — L299 Pruritus, unspecified: Secondary | ICD-10-CM | POA: Diagnosis not present

## 2020-11-02 DIAGNOSIS — Z992 Dependence on renal dialysis: Secondary | ICD-10-CM | POA: Diagnosis not present

## 2020-11-02 DIAGNOSIS — Z23 Encounter for immunization: Secondary | ICD-10-CM | POA: Diagnosis not present

## 2020-11-02 DIAGNOSIS — D631 Anemia in chronic kidney disease: Secondary | ICD-10-CM | POA: Diagnosis not present

## 2020-11-02 DIAGNOSIS — N2581 Secondary hyperparathyroidism of renal origin: Secondary | ICD-10-CM | POA: Diagnosis not present

## 2020-11-03 DIAGNOSIS — I10 Essential (primary) hypertension: Secondary | ICD-10-CM | POA: Diagnosis not present

## 2020-11-03 DIAGNOSIS — N186 End stage renal disease: Secondary | ICD-10-CM | POA: Diagnosis not present

## 2020-11-03 DIAGNOSIS — E785 Hyperlipidemia, unspecified: Secondary | ICD-10-CM | POA: Diagnosis not present

## 2020-11-03 DIAGNOSIS — K3184 Gastroparesis: Secondary | ICD-10-CM | POA: Diagnosis not present

## 2020-11-03 DIAGNOSIS — G546 Phantom limb syndrome with pain: Secondary | ICD-10-CM | POA: Diagnosis not present

## 2020-11-03 DIAGNOSIS — I679 Cerebrovascular disease, unspecified: Secondary | ICD-10-CM | POA: Diagnosis not present

## 2020-11-04 DIAGNOSIS — L299 Pruritus, unspecified: Secondary | ICD-10-CM | POA: Diagnosis not present

## 2020-11-04 DIAGNOSIS — Z992 Dependence on renal dialysis: Secondary | ICD-10-CM | POA: Diagnosis not present

## 2020-11-04 DIAGNOSIS — N186 End stage renal disease: Secondary | ICD-10-CM | POA: Diagnosis not present

## 2020-11-04 DIAGNOSIS — N2581 Secondary hyperparathyroidism of renal origin: Secondary | ICD-10-CM | POA: Diagnosis not present

## 2020-11-04 DIAGNOSIS — E1122 Type 2 diabetes mellitus with diabetic chronic kidney disease: Secondary | ICD-10-CM | POA: Diagnosis not present

## 2020-11-04 DIAGNOSIS — D631 Anemia in chronic kidney disease: Secondary | ICD-10-CM | POA: Diagnosis not present

## 2020-11-04 DIAGNOSIS — Z23 Encounter for immunization: Secondary | ICD-10-CM | POA: Diagnosis not present

## 2020-11-07 DIAGNOSIS — N2581 Secondary hyperparathyroidism of renal origin: Secondary | ICD-10-CM | POA: Diagnosis not present

## 2020-11-07 DIAGNOSIS — Z23 Encounter for immunization: Secondary | ICD-10-CM | POA: Diagnosis not present

## 2020-11-07 DIAGNOSIS — E1122 Type 2 diabetes mellitus with diabetic chronic kidney disease: Secondary | ICD-10-CM | POA: Diagnosis not present

## 2020-11-07 DIAGNOSIS — N186 End stage renal disease: Secondary | ICD-10-CM | POA: Diagnosis not present

## 2020-11-07 DIAGNOSIS — L299 Pruritus, unspecified: Secondary | ICD-10-CM | POA: Diagnosis not present

## 2020-11-07 DIAGNOSIS — Z992 Dependence on renal dialysis: Secondary | ICD-10-CM | POA: Diagnosis not present

## 2020-11-07 DIAGNOSIS — D631 Anemia in chronic kidney disease: Secondary | ICD-10-CM | POA: Diagnosis not present

## 2020-11-09 DIAGNOSIS — Z992 Dependence on renal dialysis: Secondary | ICD-10-CM | POA: Diagnosis not present

## 2020-11-09 DIAGNOSIS — N2581 Secondary hyperparathyroidism of renal origin: Secondary | ICD-10-CM | POA: Diagnosis not present

## 2020-11-09 DIAGNOSIS — L299 Pruritus, unspecified: Secondary | ICD-10-CM | POA: Diagnosis not present

## 2020-11-09 DIAGNOSIS — N186 End stage renal disease: Secondary | ICD-10-CM | POA: Diagnosis not present

## 2020-11-09 DIAGNOSIS — R197 Diarrhea, unspecified: Secondary | ICD-10-CM | POA: Diagnosis not present

## 2020-11-09 DIAGNOSIS — T7840XS Allergy, unspecified, sequela: Secondary | ICD-10-CM | POA: Diagnosis not present

## 2020-11-11 DIAGNOSIS — R197 Diarrhea, unspecified: Secondary | ICD-10-CM | POA: Diagnosis not present

## 2020-11-11 DIAGNOSIS — N186 End stage renal disease: Secondary | ICD-10-CM | POA: Diagnosis not present

## 2020-11-11 DIAGNOSIS — L299 Pruritus, unspecified: Secondary | ICD-10-CM | POA: Diagnosis not present

## 2020-11-11 DIAGNOSIS — N2581 Secondary hyperparathyroidism of renal origin: Secondary | ICD-10-CM | POA: Diagnosis not present

## 2020-11-11 DIAGNOSIS — Z992 Dependence on renal dialysis: Secondary | ICD-10-CM | POA: Diagnosis not present

## 2020-11-11 DIAGNOSIS — T7840XS Allergy, unspecified, sequela: Secondary | ICD-10-CM | POA: Diagnosis not present

## 2020-11-14 ENCOUNTER — Inpatient Hospital Stay (HOSPITAL_COMMUNITY): Admission: RE | Admit: 2020-11-14 | Payer: Medicare Other | Source: Ambulatory Visit

## 2020-11-14 ENCOUNTER — Other Ambulatory Visit (HOSPITAL_COMMUNITY): Payer: Self-pay | Admitting: Nephrology

## 2020-11-14 DIAGNOSIS — T7840XS Allergy, unspecified, sequela: Secondary | ICD-10-CM | POA: Diagnosis not present

## 2020-11-14 DIAGNOSIS — N2581 Secondary hyperparathyroidism of renal origin: Secondary | ICD-10-CM | POA: Diagnosis not present

## 2020-11-14 DIAGNOSIS — M7989 Other specified soft tissue disorders: Secondary | ICD-10-CM

## 2020-11-14 DIAGNOSIS — R197 Diarrhea, unspecified: Secondary | ICD-10-CM | POA: Diagnosis not present

## 2020-11-14 DIAGNOSIS — L299 Pruritus, unspecified: Secondary | ICD-10-CM | POA: Diagnosis not present

## 2020-11-14 DIAGNOSIS — N186 End stage renal disease: Secondary | ICD-10-CM | POA: Diagnosis not present

## 2020-11-14 DIAGNOSIS — Z992 Dependence on renal dialysis: Secondary | ICD-10-CM | POA: Diagnosis not present

## 2020-11-16 DIAGNOSIS — N186 End stage renal disease: Secondary | ICD-10-CM | POA: Diagnosis not present

## 2020-11-16 DIAGNOSIS — N2581 Secondary hyperparathyroidism of renal origin: Secondary | ICD-10-CM | POA: Diagnosis not present

## 2020-11-16 DIAGNOSIS — R197 Diarrhea, unspecified: Secondary | ICD-10-CM | POA: Diagnosis not present

## 2020-11-16 DIAGNOSIS — L299 Pruritus, unspecified: Secondary | ICD-10-CM | POA: Diagnosis not present

## 2020-11-16 DIAGNOSIS — Z992 Dependence on renal dialysis: Secondary | ICD-10-CM | POA: Diagnosis not present

## 2020-11-16 DIAGNOSIS — T7840XS Allergy, unspecified, sequela: Secondary | ICD-10-CM | POA: Diagnosis not present

## 2020-11-17 ENCOUNTER — Encounter (HOSPITAL_COMMUNITY): Payer: Medicare Other

## 2020-11-17 DIAGNOSIS — N2581 Secondary hyperparathyroidism of renal origin: Secondary | ICD-10-CM | POA: Diagnosis not present

## 2020-11-17 DIAGNOSIS — N186 End stage renal disease: Secondary | ICD-10-CM | POA: Diagnosis not present

## 2020-11-17 DIAGNOSIS — R197 Diarrhea, unspecified: Secondary | ICD-10-CM | POA: Diagnosis not present

## 2020-11-17 DIAGNOSIS — L299 Pruritus, unspecified: Secondary | ICD-10-CM | POA: Diagnosis not present

## 2020-11-17 DIAGNOSIS — Z992 Dependence on renal dialysis: Secondary | ICD-10-CM | POA: Diagnosis not present

## 2020-11-17 DIAGNOSIS — T7840XS Allergy, unspecified, sequela: Secondary | ICD-10-CM | POA: Diagnosis not present

## 2020-11-18 DIAGNOSIS — H25812 Combined forms of age-related cataract, left eye: Secondary | ICD-10-CM | POA: Diagnosis not present

## 2020-11-21 DIAGNOSIS — R197 Diarrhea, unspecified: Secondary | ICD-10-CM | POA: Diagnosis not present

## 2020-11-21 DIAGNOSIS — T7840XS Allergy, unspecified, sequela: Secondary | ICD-10-CM | POA: Diagnosis not present

## 2020-11-21 DIAGNOSIS — L299 Pruritus, unspecified: Secondary | ICD-10-CM | POA: Diagnosis not present

## 2020-11-21 DIAGNOSIS — N2581 Secondary hyperparathyroidism of renal origin: Secondary | ICD-10-CM | POA: Diagnosis not present

## 2020-11-21 DIAGNOSIS — Z992 Dependence on renal dialysis: Secondary | ICD-10-CM | POA: Diagnosis not present

## 2020-11-21 DIAGNOSIS — N186 End stage renal disease: Secondary | ICD-10-CM | POA: Diagnosis not present

## 2020-11-22 ENCOUNTER — Encounter (INDEPENDENT_AMBULATORY_CARE_PROVIDER_SITE_OTHER): Payer: Medicare Other | Admitting: Ophthalmology

## 2020-11-22 ENCOUNTER — Ambulatory Visit (HOSPITAL_COMMUNITY)
Admission: RE | Admit: 2020-11-22 | Discharge: 2020-11-22 | Disposition: A | Discharge status: Home / Self Care | Payer: Medicare Other | Source: Ambulatory Visit | Attending: Nephrology | Admitting: Nephrology

## 2020-11-22 ENCOUNTER — Other Ambulatory Visit: Payer: Self-pay

## 2020-11-22 DIAGNOSIS — H43813 Vitreous degeneration, bilateral: Secondary | ICD-10-CM

## 2020-11-22 DIAGNOSIS — I10 Essential (primary) hypertension: Secondary | ICD-10-CM

## 2020-11-22 DIAGNOSIS — H35033 Hypertensive retinopathy, bilateral: Secondary | ICD-10-CM | POA: Diagnosis not present

## 2020-11-22 DIAGNOSIS — M7989 Other specified soft tissue disorders: Secondary | ICD-10-CM

## 2020-11-22 DIAGNOSIS — E113513 Type 2 diabetes mellitus with proliferative diabetic retinopathy with macular edema, bilateral: Secondary | ICD-10-CM

## 2020-11-23 DIAGNOSIS — R197 Diarrhea, unspecified: Secondary | ICD-10-CM | POA: Diagnosis not present

## 2020-11-23 DIAGNOSIS — N186 End stage renal disease: Secondary | ICD-10-CM | POA: Diagnosis not present

## 2020-11-23 DIAGNOSIS — N2581 Secondary hyperparathyroidism of renal origin: Secondary | ICD-10-CM | POA: Diagnosis not present

## 2020-11-23 DIAGNOSIS — T7840XS Allergy, unspecified, sequela: Secondary | ICD-10-CM | POA: Diagnosis not present

## 2020-11-23 DIAGNOSIS — L299 Pruritus, unspecified: Secondary | ICD-10-CM | POA: Diagnosis not present

## 2020-11-23 DIAGNOSIS — Z992 Dependence on renal dialysis: Secondary | ICD-10-CM | POA: Diagnosis not present

## 2020-11-25 DIAGNOSIS — N186 End stage renal disease: Secondary | ICD-10-CM | POA: Diagnosis not present

## 2020-11-25 DIAGNOSIS — R197 Diarrhea, unspecified: Secondary | ICD-10-CM | POA: Diagnosis not present

## 2020-11-25 DIAGNOSIS — L299 Pruritus, unspecified: Secondary | ICD-10-CM | POA: Diagnosis not present

## 2020-11-25 DIAGNOSIS — Z992 Dependence on renal dialysis: Secondary | ICD-10-CM | POA: Diagnosis not present

## 2020-11-25 DIAGNOSIS — T7840XS Allergy, unspecified, sequela: Secondary | ICD-10-CM | POA: Diagnosis not present

## 2020-11-25 DIAGNOSIS — N2581 Secondary hyperparathyroidism of renal origin: Secondary | ICD-10-CM | POA: Diagnosis not present

## 2020-11-28 DIAGNOSIS — N2581 Secondary hyperparathyroidism of renal origin: Secondary | ICD-10-CM | POA: Diagnosis not present

## 2020-11-28 DIAGNOSIS — T7840XS Allergy, unspecified, sequela: Secondary | ICD-10-CM | POA: Diagnosis not present

## 2020-11-28 DIAGNOSIS — N186 End stage renal disease: Secondary | ICD-10-CM | POA: Diagnosis not present

## 2020-11-28 DIAGNOSIS — Z992 Dependence on renal dialysis: Secondary | ICD-10-CM | POA: Diagnosis not present

## 2020-11-28 DIAGNOSIS — L299 Pruritus, unspecified: Secondary | ICD-10-CM | POA: Diagnosis not present

## 2020-11-28 DIAGNOSIS — R197 Diarrhea, unspecified: Secondary | ICD-10-CM | POA: Diagnosis not present

## 2020-11-30 DIAGNOSIS — N186 End stage renal disease: Secondary | ICD-10-CM | POA: Diagnosis not present

## 2020-11-30 DIAGNOSIS — Z992 Dependence on renal dialysis: Secondary | ICD-10-CM | POA: Diagnosis not present

## 2020-11-30 DIAGNOSIS — T7840XS Allergy, unspecified, sequela: Secondary | ICD-10-CM | POA: Diagnosis not present

## 2020-11-30 DIAGNOSIS — L299 Pruritus, unspecified: Secondary | ICD-10-CM | POA: Diagnosis not present

## 2020-11-30 DIAGNOSIS — R197 Diarrhea, unspecified: Secondary | ICD-10-CM | POA: Diagnosis not present

## 2020-11-30 DIAGNOSIS — N2581 Secondary hyperparathyroidism of renal origin: Secondary | ICD-10-CM | POA: Diagnosis not present

## 2020-12-03 DIAGNOSIS — N2581 Secondary hyperparathyroidism of renal origin: Secondary | ICD-10-CM | POA: Diagnosis not present

## 2020-12-03 DIAGNOSIS — N186 End stage renal disease: Secondary | ICD-10-CM | POA: Diagnosis not present

## 2020-12-03 DIAGNOSIS — R197 Diarrhea, unspecified: Secondary | ICD-10-CM | POA: Diagnosis not present

## 2020-12-03 DIAGNOSIS — L299 Pruritus, unspecified: Secondary | ICD-10-CM | POA: Diagnosis not present

## 2020-12-03 DIAGNOSIS — Z992 Dependence on renal dialysis: Secondary | ICD-10-CM | POA: Diagnosis not present

## 2020-12-03 DIAGNOSIS — T7840XS Allergy, unspecified, sequela: Secondary | ICD-10-CM | POA: Diagnosis not present

## 2020-12-05 DIAGNOSIS — T7840XS Allergy, unspecified, sequela: Secondary | ICD-10-CM | POA: Diagnosis not present

## 2020-12-05 DIAGNOSIS — N186 End stage renal disease: Secondary | ICD-10-CM | POA: Diagnosis not present

## 2020-12-05 DIAGNOSIS — Z992 Dependence on renal dialysis: Secondary | ICD-10-CM | POA: Diagnosis not present

## 2020-12-05 DIAGNOSIS — R197 Diarrhea, unspecified: Secondary | ICD-10-CM | POA: Diagnosis not present

## 2020-12-05 DIAGNOSIS — N2581 Secondary hyperparathyroidism of renal origin: Secondary | ICD-10-CM | POA: Diagnosis not present

## 2020-12-05 DIAGNOSIS — L299 Pruritus, unspecified: Secondary | ICD-10-CM | POA: Diagnosis not present

## 2020-12-07 DIAGNOSIS — Z992 Dependence on renal dialysis: Secondary | ICD-10-CM | POA: Diagnosis not present

## 2020-12-07 DIAGNOSIS — R197 Diarrhea, unspecified: Secondary | ICD-10-CM | POA: Diagnosis not present

## 2020-12-07 DIAGNOSIS — T7840XS Allergy, unspecified, sequela: Secondary | ICD-10-CM | POA: Diagnosis not present

## 2020-12-07 DIAGNOSIS — E1122 Type 2 diabetes mellitus with diabetic chronic kidney disease: Secondary | ICD-10-CM | POA: Diagnosis not present

## 2020-12-07 DIAGNOSIS — N186 End stage renal disease: Secondary | ICD-10-CM | POA: Diagnosis not present

## 2020-12-07 DIAGNOSIS — N2581 Secondary hyperparathyroidism of renal origin: Secondary | ICD-10-CM | POA: Diagnosis not present

## 2020-12-07 DIAGNOSIS — L299 Pruritus, unspecified: Secondary | ICD-10-CM | POA: Diagnosis not present

## 2020-12-09 DIAGNOSIS — N186 End stage renal disease: Secondary | ICD-10-CM | POA: Diagnosis not present

## 2020-12-09 DIAGNOSIS — R197 Diarrhea, unspecified: Secondary | ICD-10-CM | POA: Diagnosis not present

## 2020-12-09 DIAGNOSIS — N2581 Secondary hyperparathyroidism of renal origin: Secondary | ICD-10-CM | POA: Diagnosis not present

## 2020-12-09 DIAGNOSIS — E1122 Type 2 diabetes mellitus with diabetic chronic kidney disease: Secondary | ICD-10-CM | POA: Diagnosis not present

## 2020-12-09 DIAGNOSIS — Z992 Dependence on renal dialysis: Secondary | ICD-10-CM | POA: Diagnosis not present

## 2020-12-09 DIAGNOSIS — L299 Pruritus, unspecified: Secondary | ICD-10-CM | POA: Diagnosis not present

## 2020-12-12 DIAGNOSIS — N2581 Secondary hyperparathyroidism of renal origin: Secondary | ICD-10-CM | POA: Diagnosis not present

## 2020-12-12 DIAGNOSIS — L299 Pruritus, unspecified: Secondary | ICD-10-CM | POA: Diagnosis not present

## 2020-12-12 DIAGNOSIS — N186 End stage renal disease: Secondary | ICD-10-CM | POA: Diagnosis not present

## 2020-12-12 DIAGNOSIS — Z992 Dependence on renal dialysis: Secondary | ICD-10-CM | POA: Diagnosis not present

## 2020-12-12 DIAGNOSIS — E1122 Type 2 diabetes mellitus with diabetic chronic kidney disease: Secondary | ICD-10-CM | POA: Diagnosis not present

## 2020-12-12 DIAGNOSIS — R197 Diarrhea, unspecified: Secondary | ICD-10-CM | POA: Diagnosis not present

## 2020-12-14 DIAGNOSIS — N186 End stage renal disease: Secondary | ICD-10-CM | POA: Diagnosis not present

## 2020-12-14 DIAGNOSIS — L299 Pruritus, unspecified: Secondary | ICD-10-CM | POA: Diagnosis not present

## 2020-12-14 DIAGNOSIS — R197 Diarrhea, unspecified: Secondary | ICD-10-CM | POA: Diagnosis not present

## 2020-12-14 DIAGNOSIS — Z992 Dependence on renal dialysis: Secondary | ICD-10-CM | POA: Diagnosis not present

## 2020-12-14 DIAGNOSIS — N2581 Secondary hyperparathyroidism of renal origin: Secondary | ICD-10-CM | POA: Diagnosis not present

## 2020-12-14 DIAGNOSIS — E1122 Type 2 diabetes mellitus with diabetic chronic kidney disease: Secondary | ICD-10-CM | POA: Diagnosis not present

## 2020-12-15 ENCOUNTER — Telehealth: Payer: Self-pay | Admitting: Adult Health

## 2020-12-15 ENCOUNTER — Telehealth: Payer: Self-pay

## 2020-12-15 NOTE — Telephone Encounter (Signed)
Called pt left message to get appt rescheduled due to provider being out of the office.

## 2020-12-15 NOTE — Telephone Encounter (Signed)
Please reschedule pt appt if call back

## 2020-12-16 DIAGNOSIS — L299 Pruritus, unspecified: Secondary | ICD-10-CM | POA: Diagnosis not present

## 2020-12-16 DIAGNOSIS — N186 End stage renal disease: Secondary | ICD-10-CM | POA: Diagnosis not present

## 2020-12-16 DIAGNOSIS — Z992 Dependence on renal dialysis: Secondary | ICD-10-CM | POA: Diagnosis not present

## 2020-12-16 DIAGNOSIS — N2581 Secondary hyperparathyroidism of renal origin: Secondary | ICD-10-CM | POA: Diagnosis not present

## 2020-12-16 DIAGNOSIS — R197 Diarrhea, unspecified: Secondary | ICD-10-CM | POA: Diagnosis not present

## 2020-12-16 DIAGNOSIS — E1122 Type 2 diabetes mellitus with diabetic chronic kidney disease: Secondary | ICD-10-CM | POA: Diagnosis not present

## 2020-12-19 DIAGNOSIS — N186 End stage renal disease: Secondary | ICD-10-CM | POA: Diagnosis not present

## 2020-12-19 DIAGNOSIS — N2581 Secondary hyperparathyroidism of renal origin: Secondary | ICD-10-CM | POA: Diagnosis not present

## 2020-12-19 DIAGNOSIS — Z992 Dependence on renal dialysis: Secondary | ICD-10-CM | POA: Diagnosis not present

## 2020-12-19 DIAGNOSIS — E1122 Type 2 diabetes mellitus with diabetic chronic kidney disease: Secondary | ICD-10-CM | POA: Diagnosis not present

## 2020-12-19 DIAGNOSIS — L299 Pruritus, unspecified: Secondary | ICD-10-CM | POA: Diagnosis not present

## 2020-12-19 DIAGNOSIS — R197 Diarrhea, unspecified: Secondary | ICD-10-CM | POA: Diagnosis not present

## 2020-12-20 ENCOUNTER — Encounter (INDEPENDENT_AMBULATORY_CARE_PROVIDER_SITE_OTHER): Payer: Medicare Other | Admitting: Ophthalmology

## 2020-12-21 DIAGNOSIS — N2581 Secondary hyperparathyroidism of renal origin: Secondary | ICD-10-CM | POA: Diagnosis not present

## 2020-12-21 DIAGNOSIS — N186 End stage renal disease: Secondary | ICD-10-CM | POA: Diagnosis not present

## 2020-12-21 DIAGNOSIS — R197 Diarrhea, unspecified: Secondary | ICD-10-CM | POA: Diagnosis not present

## 2020-12-21 DIAGNOSIS — E1122 Type 2 diabetes mellitus with diabetic chronic kidney disease: Secondary | ICD-10-CM | POA: Diagnosis not present

## 2020-12-21 DIAGNOSIS — Z992 Dependence on renal dialysis: Secondary | ICD-10-CM | POA: Diagnosis not present

## 2020-12-21 DIAGNOSIS — L299 Pruritus, unspecified: Secondary | ICD-10-CM | POA: Diagnosis not present

## 2020-12-23 DIAGNOSIS — N2581 Secondary hyperparathyroidism of renal origin: Secondary | ICD-10-CM | POA: Diagnosis not present

## 2020-12-23 DIAGNOSIS — N186 End stage renal disease: Secondary | ICD-10-CM | POA: Diagnosis not present

## 2020-12-23 DIAGNOSIS — Z992 Dependence on renal dialysis: Secondary | ICD-10-CM | POA: Diagnosis not present

## 2020-12-23 DIAGNOSIS — E1122 Type 2 diabetes mellitus with diabetic chronic kidney disease: Secondary | ICD-10-CM | POA: Diagnosis not present

## 2020-12-23 DIAGNOSIS — R197 Diarrhea, unspecified: Secondary | ICD-10-CM | POA: Diagnosis not present

## 2020-12-23 DIAGNOSIS — L299 Pruritus, unspecified: Secondary | ICD-10-CM | POA: Diagnosis not present

## 2020-12-26 DIAGNOSIS — L299 Pruritus, unspecified: Secondary | ICD-10-CM | POA: Diagnosis not present

## 2020-12-26 DIAGNOSIS — N2581 Secondary hyperparathyroidism of renal origin: Secondary | ICD-10-CM | POA: Diagnosis not present

## 2020-12-26 DIAGNOSIS — E1122 Type 2 diabetes mellitus with diabetic chronic kidney disease: Secondary | ICD-10-CM | POA: Diagnosis not present

## 2020-12-26 DIAGNOSIS — Z992 Dependence on renal dialysis: Secondary | ICD-10-CM | POA: Diagnosis not present

## 2020-12-26 DIAGNOSIS — R197 Diarrhea, unspecified: Secondary | ICD-10-CM | POA: Diagnosis not present

## 2020-12-26 DIAGNOSIS — N186 End stage renal disease: Secondary | ICD-10-CM | POA: Diagnosis not present

## 2020-12-27 ENCOUNTER — Ambulatory Visit: Payer: Medicare Other | Admitting: Adult Health

## 2020-12-28 DIAGNOSIS — E1122 Type 2 diabetes mellitus with diabetic chronic kidney disease: Secondary | ICD-10-CM | POA: Diagnosis not present

## 2020-12-28 DIAGNOSIS — N186 End stage renal disease: Secondary | ICD-10-CM | POA: Diagnosis not present

## 2020-12-28 DIAGNOSIS — Z992 Dependence on renal dialysis: Secondary | ICD-10-CM | POA: Diagnosis not present

## 2020-12-28 DIAGNOSIS — N2581 Secondary hyperparathyroidism of renal origin: Secondary | ICD-10-CM | POA: Diagnosis not present

## 2020-12-28 DIAGNOSIS — R197 Diarrhea, unspecified: Secondary | ICD-10-CM | POA: Diagnosis not present

## 2020-12-28 DIAGNOSIS — L299 Pruritus, unspecified: Secondary | ICD-10-CM | POA: Diagnosis not present

## 2020-12-30 DIAGNOSIS — R197 Diarrhea, unspecified: Secondary | ICD-10-CM | POA: Diagnosis not present

## 2020-12-30 DIAGNOSIS — L299 Pruritus, unspecified: Secondary | ICD-10-CM | POA: Diagnosis not present

## 2020-12-30 DIAGNOSIS — N2581 Secondary hyperparathyroidism of renal origin: Secondary | ICD-10-CM | POA: Diagnosis not present

## 2020-12-30 DIAGNOSIS — Z992 Dependence on renal dialysis: Secondary | ICD-10-CM | POA: Diagnosis not present

## 2020-12-30 DIAGNOSIS — E1122 Type 2 diabetes mellitus with diabetic chronic kidney disease: Secondary | ICD-10-CM | POA: Diagnosis not present

## 2020-12-30 DIAGNOSIS — N186 End stage renal disease: Secondary | ICD-10-CM | POA: Diagnosis not present

## 2021-01-02 DIAGNOSIS — L299 Pruritus, unspecified: Secondary | ICD-10-CM | POA: Diagnosis not present

## 2021-01-02 DIAGNOSIS — N2581 Secondary hyperparathyroidism of renal origin: Secondary | ICD-10-CM | POA: Diagnosis not present

## 2021-01-02 DIAGNOSIS — E1122 Type 2 diabetes mellitus with diabetic chronic kidney disease: Secondary | ICD-10-CM | POA: Diagnosis not present

## 2021-01-02 DIAGNOSIS — N186 End stage renal disease: Secondary | ICD-10-CM | POA: Diagnosis not present

## 2021-01-02 DIAGNOSIS — R197 Diarrhea, unspecified: Secondary | ICD-10-CM | POA: Diagnosis not present

## 2021-01-02 DIAGNOSIS — Z992 Dependence on renal dialysis: Secondary | ICD-10-CM | POA: Diagnosis not present

## 2021-01-04 DIAGNOSIS — N186 End stage renal disease: Secondary | ICD-10-CM | POA: Diagnosis not present

## 2021-01-04 DIAGNOSIS — R197 Diarrhea, unspecified: Secondary | ICD-10-CM | POA: Diagnosis not present

## 2021-01-04 DIAGNOSIS — L299 Pruritus, unspecified: Secondary | ICD-10-CM | POA: Diagnosis not present

## 2021-01-04 DIAGNOSIS — E1122 Type 2 diabetes mellitus with diabetic chronic kidney disease: Secondary | ICD-10-CM | POA: Diagnosis not present

## 2021-01-04 DIAGNOSIS — N2581 Secondary hyperparathyroidism of renal origin: Secondary | ICD-10-CM | POA: Diagnosis not present

## 2021-01-04 DIAGNOSIS — Z992 Dependence on renal dialysis: Secondary | ICD-10-CM | POA: Diagnosis not present

## 2021-01-06 DIAGNOSIS — N2581 Secondary hyperparathyroidism of renal origin: Secondary | ICD-10-CM | POA: Diagnosis not present

## 2021-01-06 DIAGNOSIS — Z992 Dependence on renal dialysis: Secondary | ICD-10-CM | POA: Diagnosis not present

## 2021-01-06 DIAGNOSIS — E1122 Type 2 diabetes mellitus with diabetic chronic kidney disease: Secondary | ICD-10-CM | POA: Diagnosis not present

## 2021-01-06 DIAGNOSIS — L299 Pruritus, unspecified: Secondary | ICD-10-CM | POA: Diagnosis not present

## 2021-01-06 DIAGNOSIS — N186 End stage renal disease: Secondary | ICD-10-CM | POA: Diagnosis not present

## 2021-01-06 DIAGNOSIS — R197 Diarrhea, unspecified: Secondary | ICD-10-CM | POA: Diagnosis not present

## 2021-01-07 DIAGNOSIS — E1122 Type 2 diabetes mellitus with diabetic chronic kidney disease: Secondary | ICD-10-CM | POA: Diagnosis not present

## 2021-01-07 DIAGNOSIS — Z992 Dependence on renal dialysis: Secondary | ICD-10-CM | POA: Diagnosis not present

## 2021-01-07 DIAGNOSIS — N186 End stage renal disease: Secondary | ICD-10-CM | POA: Diagnosis not present

## 2021-01-09 DIAGNOSIS — E1122 Type 2 diabetes mellitus with diabetic chronic kidney disease: Secondary | ICD-10-CM | POA: Diagnosis not present

## 2021-01-09 DIAGNOSIS — T7840XS Allergy, unspecified, sequela: Secondary | ICD-10-CM | POA: Diagnosis not present

## 2021-01-09 DIAGNOSIS — L299 Pruritus, unspecified: Secondary | ICD-10-CM | POA: Diagnosis not present

## 2021-01-09 DIAGNOSIS — Z992 Dependence on renal dialysis: Secondary | ICD-10-CM | POA: Diagnosis not present

## 2021-01-09 DIAGNOSIS — N186 End stage renal disease: Secondary | ICD-10-CM | POA: Diagnosis not present

## 2021-01-09 DIAGNOSIS — N2581 Secondary hyperparathyroidism of renal origin: Secondary | ICD-10-CM | POA: Diagnosis not present

## 2021-01-09 DIAGNOSIS — R197 Diarrhea, unspecified: Secondary | ICD-10-CM | POA: Diagnosis not present

## 2021-01-11 DIAGNOSIS — Z992 Dependence on renal dialysis: Secondary | ICD-10-CM | POA: Diagnosis not present

## 2021-01-11 DIAGNOSIS — T7840XS Allergy, unspecified, sequela: Secondary | ICD-10-CM | POA: Diagnosis not present

## 2021-01-11 DIAGNOSIS — N2581 Secondary hyperparathyroidism of renal origin: Secondary | ICD-10-CM | POA: Diagnosis not present

## 2021-01-11 DIAGNOSIS — E1122 Type 2 diabetes mellitus with diabetic chronic kidney disease: Secondary | ICD-10-CM | POA: Diagnosis not present

## 2021-01-11 DIAGNOSIS — R197 Diarrhea, unspecified: Secondary | ICD-10-CM | POA: Diagnosis not present

## 2021-01-11 DIAGNOSIS — N186 End stage renal disease: Secondary | ICD-10-CM | POA: Diagnosis not present

## 2021-01-11 DIAGNOSIS — L299 Pruritus, unspecified: Secondary | ICD-10-CM | POA: Diagnosis not present

## 2021-01-13 DIAGNOSIS — N186 End stage renal disease: Secondary | ICD-10-CM | POA: Diagnosis not present

## 2021-01-13 DIAGNOSIS — L299 Pruritus, unspecified: Secondary | ICD-10-CM | POA: Diagnosis not present

## 2021-01-13 DIAGNOSIS — E1122 Type 2 diabetes mellitus with diabetic chronic kidney disease: Secondary | ICD-10-CM | POA: Diagnosis not present

## 2021-01-13 DIAGNOSIS — Z992 Dependence on renal dialysis: Secondary | ICD-10-CM | POA: Diagnosis not present

## 2021-01-13 DIAGNOSIS — T7840XS Allergy, unspecified, sequela: Secondary | ICD-10-CM | POA: Diagnosis not present

## 2021-01-13 DIAGNOSIS — N2581 Secondary hyperparathyroidism of renal origin: Secondary | ICD-10-CM | POA: Diagnosis not present

## 2021-01-13 DIAGNOSIS — R197 Diarrhea, unspecified: Secondary | ICD-10-CM | POA: Diagnosis not present

## 2021-01-16 DIAGNOSIS — R197 Diarrhea, unspecified: Secondary | ICD-10-CM | POA: Diagnosis not present

## 2021-01-16 DIAGNOSIS — E1122 Type 2 diabetes mellitus with diabetic chronic kidney disease: Secondary | ICD-10-CM | POA: Diagnosis not present

## 2021-01-16 DIAGNOSIS — N2581 Secondary hyperparathyroidism of renal origin: Secondary | ICD-10-CM | POA: Diagnosis not present

## 2021-01-16 DIAGNOSIS — T7840XS Allergy, unspecified, sequela: Secondary | ICD-10-CM | POA: Diagnosis not present

## 2021-01-16 DIAGNOSIS — L299 Pruritus, unspecified: Secondary | ICD-10-CM | POA: Diagnosis not present

## 2021-01-16 DIAGNOSIS — N186 End stage renal disease: Secondary | ICD-10-CM | POA: Diagnosis not present

## 2021-01-16 DIAGNOSIS — Z992 Dependence on renal dialysis: Secondary | ICD-10-CM | POA: Diagnosis not present

## 2021-01-18 DIAGNOSIS — Z992 Dependence on renal dialysis: Secondary | ICD-10-CM | POA: Diagnosis not present

## 2021-01-18 DIAGNOSIS — N186 End stage renal disease: Secondary | ICD-10-CM | POA: Diagnosis not present

## 2021-01-18 DIAGNOSIS — T7840XS Allergy, unspecified, sequela: Secondary | ICD-10-CM | POA: Diagnosis not present

## 2021-01-18 DIAGNOSIS — R197 Diarrhea, unspecified: Secondary | ICD-10-CM | POA: Diagnosis not present

## 2021-01-18 DIAGNOSIS — L299 Pruritus, unspecified: Secondary | ICD-10-CM | POA: Diagnosis not present

## 2021-01-18 DIAGNOSIS — E1122 Type 2 diabetes mellitus with diabetic chronic kidney disease: Secondary | ICD-10-CM | POA: Diagnosis not present

## 2021-01-18 DIAGNOSIS — N2581 Secondary hyperparathyroidism of renal origin: Secondary | ICD-10-CM | POA: Diagnosis not present

## 2021-01-20 DIAGNOSIS — R197 Diarrhea, unspecified: Secondary | ICD-10-CM | POA: Diagnosis not present

## 2021-01-20 DIAGNOSIS — T7840XS Allergy, unspecified, sequela: Secondary | ICD-10-CM | POA: Diagnosis not present

## 2021-01-20 DIAGNOSIS — E1122 Type 2 diabetes mellitus with diabetic chronic kidney disease: Secondary | ICD-10-CM | POA: Diagnosis not present

## 2021-01-20 DIAGNOSIS — Z992 Dependence on renal dialysis: Secondary | ICD-10-CM | POA: Diagnosis not present

## 2021-01-20 DIAGNOSIS — N186 End stage renal disease: Secondary | ICD-10-CM | POA: Diagnosis not present

## 2021-01-20 DIAGNOSIS — N2581 Secondary hyperparathyroidism of renal origin: Secondary | ICD-10-CM | POA: Diagnosis not present

## 2021-01-20 DIAGNOSIS — L299 Pruritus, unspecified: Secondary | ICD-10-CM | POA: Diagnosis not present

## 2021-01-23 DIAGNOSIS — R197 Diarrhea, unspecified: Secondary | ICD-10-CM | POA: Diagnosis not present

## 2021-01-23 DIAGNOSIS — N186 End stage renal disease: Secondary | ICD-10-CM | POA: Diagnosis not present

## 2021-01-23 DIAGNOSIS — L299 Pruritus, unspecified: Secondary | ICD-10-CM | POA: Diagnosis not present

## 2021-01-23 DIAGNOSIS — T7840XS Allergy, unspecified, sequela: Secondary | ICD-10-CM | POA: Diagnosis not present

## 2021-01-23 DIAGNOSIS — E1122 Type 2 diabetes mellitus with diabetic chronic kidney disease: Secondary | ICD-10-CM | POA: Diagnosis not present

## 2021-01-23 DIAGNOSIS — N2581 Secondary hyperparathyroidism of renal origin: Secondary | ICD-10-CM | POA: Diagnosis not present

## 2021-01-23 DIAGNOSIS — Z992 Dependence on renal dialysis: Secondary | ICD-10-CM | POA: Diagnosis not present

## 2021-01-24 DIAGNOSIS — N186 End stage renal disease: Secondary | ICD-10-CM | POA: Diagnosis not present

## 2021-01-24 DIAGNOSIS — I871 Compression of vein: Secondary | ICD-10-CM | POA: Diagnosis not present

## 2021-01-24 DIAGNOSIS — Z992 Dependence on renal dialysis: Secondary | ICD-10-CM | POA: Diagnosis not present

## 2021-01-25 DIAGNOSIS — L299 Pruritus, unspecified: Secondary | ICD-10-CM | POA: Diagnosis not present

## 2021-01-25 DIAGNOSIS — Z992 Dependence on renal dialysis: Secondary | ICD-10-CM | POA: Diagnosis not present

## 2021-01-25 DIAGNOSIS — N2581 Secondary hyperparathyroidism of renal origin: Secondary | ICD-10-CM | POA: Diagnosis not present

## 2021-01-25 DIAGNOSIS — E1122 Type 2 diabetes mellitus with diabetic chronic kidney disease: Secondary | ICD-10-CM | POA: Diagnosis not present

## 2021-01-25 DIAGNOSIS — N186 End stage renal disease: Secondary | ICD-10-CM | POA: Diagnosis not present

## 2021-01-25 DIAGNOSIS — R197 Diarrhea, unspecified: Secondary | ICD-10-CM | POA: Diagnosis not present

## 2021-01-25 DIAGNOSIS — T7840XS Allergy, unspecified, sequela: Secondary | ICD-10-CM | POA: Diagnosis not present

## 2021-01-27 DIAGNOSIS — Z992 Dependence on renal dialysis: Secondary | ICD-10-CM | POA: Diagnosis not present

## 2021-01-27 DIAGNOSIS — N186 End stage renal disease: Secondary | ICD-10-CM | POA: Diagnosis not present

## 2021-01-27 DIAGNOSIS — N2581 Secondary hyperparathyroidism of renal origin: Secondary | ICD-10-CM | POA: Diagnosis not present

## 2021-01-27 DIAGNOSIS — T7840XS Allergy, unspecified, sequela: Secondary | ICD-10-CM | POA: Diagnosis not present

## 2021-01-27 DIAGNOSIS — R197 Diarrhea, unspecified: Secondary | ICD-10-CM | POA: Diagnosis not present

## 2021-01-27 DIAGNOSIS — L299 Pruritus, unspecified: Secondary | ICD-10-CM | POA: Diagnosis not present

## 2021-01-27 DIAGNOSIS — E1122 Type 2 diabetes mellitus with diabetic chronic kidney disease: Secondary | ICD-10-CM | POA: Diagnosis not present

## 2021-01-30 DIAGNOSIS — R197 Diarrhea, unspecified: Secondary | ICD-10-CM | POA: Diagnosis not present

## 2021-01-30 DIAGNOSIS — N186 End stage renal disease: Secondary | ICD-10-CM | POA: Diagnosis not present

## 2021-01-30 DIAGNOSIS — E1122 Type 2 diabetes mellitus with diabetic chronic kidney disease: Secondary | ICD-10-CM | POA: Diagnosis not present

## 2021-01-30 DIAGNOSIS — Z992 Dependence on renal dialysis: Secondary | ICD-10-CM | POA: Diagnosis not present

## 2021-01-30 DIAGNOSIS — T7840XS Allergy, unspecified, sequela: Secondary | ICD-10-CM | POA: Diagnosis not present

## 2021-01-30 DIAGNOSIS — N2581 Secondary hyperparathyroidism of renal origin: Secondary | ICD-10-CM | POA: Diagnosis not present

## 2021-01-30 DIAGNOSIS — L299 Pruritus, unspecified: Secondary | ICD-10-CM | POA: Diagnosis not present

## 2021-02-01 DIAGNOSIS — Z992 Dependence on renal dialysis: Secondary | ICD-10-CM | POA: Diagnosis not present

## 2021-02-01 DIAGNOSIS — R197 Diarrhea, unspecified: Secondary | ICD-10-CM | POA: Diagnosis not present

## 2021-02-01 DIAGNOSIS — L299 Pruritus, unspecified: Secondary | ICD-10-CM | POA: Diagnosis not present

## 2021-02-01 DIAGNOSIS — E1122 Type 2 diabetes mellitus with diabetic chronic kidney disease: Secondary | ICD-10-CM | POA: Diagnosis not present

## 2021-02-01 DIAGNOSIS — T7840XS Allergy, unspecified, sequela: Secondary | ICD-10-CM | POA: Diagnosis not present

## 2021-02-01 DIAGNOSIS — N186 End stage renal disease: Secondary | ICD-10-CM | POA: Diagnosis not present

## 2021-02-01 DIAGNOSIS — N2581 Secondary hyperparathyroidism of renal origin: Secondary | ICD-10-CM | POA: Diagnosis not present

## 2021-02-02 ENCOUNTER — Encounter (INDEPENDENT_AMBULATORY_CARE_PROVIDER_SITE_OTHER): Payer: Medicare Other | Admitting: Ophthalmology

## 2021-02-02 ENCOUNTER — Other Ambulatory Visit: Payer: Self-pay

## 2021-02-02 DIAGNOSIS — H35033 Hypertensive retinopathy, bilateral: Secondary | ICD-10-CM | POA: Diagnosis not present

## 2021-02-02 DIAGNOSIS — L24A9 Irritant contact dermatitis due friction or contact with other specified body fluids: Secondary | ICD-10-CM | POA: Diagnosis not present

## 2021-02-02 DIAGNOSIS — E113513 Type 2 diabetes mellitus with proliferative diabetic retinopathy with macular edema, bilateral: Secondary | ICD-10-CM

## 2021-02-02 DIAGNOSIS — E162 Hypoglycemia, unspecified: Secondary | ICD-10-CM | POA: Diagnosis not present

## 2021-02-02 DIAGNOSIS — T148XXA Other injury of unspecified body region, initial encounter: Secondary | ICD-10-CM | POA: Diagnosis not present

## 2021-02-02 DIAGNOSIS — H43813 Vitreous degeneration, bilateral: Secondary | ICD-10-CM | POA: Diagnosis not present

## 2021-02-02 DIAGNOSIS — K3184 Gastroparesis: Secondary | ICD-10-CM | POA: Diagnosis not present

## 2021-02-02 DIAGNOSIS — E1122 Type 2 diabetes mellitus with diabetic chronic kidney disease: Secondary | ICD-10-CM | POA: Diagnosis not present

## 2021-02-02 DIAGNOSIS — Z992 Dependence on renal dialysis: Secondary | ICD-10-CM | POA: Diagnosis not present

## 2021-02-02 DIAGNOSIS — I10 Essential (primary) hypertension: Secondary | ICD-10-CM

## 2021-02-02 DIAGNOSIS — G546 Phantom limb syndrome with pain: Secondary | ICD-10-CM | POA: Diagnosis not present

## 2021-02-03 DIAGNOSIS — E1122 Type 2 diabetes mellitus with diabetic chronic kidney disease: Secondary | ICD-10-CM | POA: Diagnosis not present

## 2021-02-03 DIAGNOSIS — N2581 Secondary hyperparathyroidism of renal origin: Secondary | ICD-10-CM | POA: Diagnosis not present

## 2021-02-03 DIAGNOSIS — Z992 Dependence on renal dialysis: Secondary | ICD-10-CM | POA: Diagnosis not present

## 2021-02-03 DIAGNOSIS — T7840XS Allergy, unspecified, sequela: Secondary | ICD-10-CM | POA: Diagnosis not present

## 2021-02-03 DIAGNOSIS — R197 Diarrhea, unspecified: Secondary | ICD-10-CM | POA: Diagnosis not present

## 2021-02-03 DIAGNOSIS — N186 End stage renal disease: Secondary | ICD-10-CM | POA: Diagnosis not present

## 2021-02-03 DIAGNOSIS — L299 Pruritus, unspecified: Secondary | ICD-10-CM | POA: Diagnosis not present

## 2021-02-06 DIAGNOSIS — N2581 Secondary hyperparathyroidism of renal origin: Secondary | ICD-10-CM | POA: Diagnosis not present

## 2021-02-06 DIAGNOSIS — L299 Pruritus, unspecified: Secondary | ICD-10-CM | POA: Diagnosis not present

## 2021-02-06 DIAGNOSIS — N186 End stage renal disease: Secondary | ICD-10-CM | POA: Diagnosis not present

## 2021-02-06 DIAGNOSIS — Z992 Dependence on renal dialysis: Secondary | ICD-10-CM | POA: Diagnosis not present

## 2021-02-06 DIAGNOSIS — R197 Diarrhea, unspecified: Secondary | ICD-10-CM | POA: Diagnosis not present

## 2021-02-06 DIAGNOSIS — T7840XS Allergy, unspecified, sequela: Secondary | ICD-10-CM | POA: Diagnosis not present

## 2021-02-06 DIAGNOSIS — E1122 Type 2 diabetes mellitus with diabetic chronic kidney disease: Secondary | ICD-10-CM | POA: Diagnosis not present

## 2021-02-07 DIAGNOSIS — Z992 Dependence on renal dialysis: Secondary | ICD-10-CM | POA: Diagnosis not present

## 2021-02-07 DIAGNOSIS — N186 End stage renal disease: Secondary | ICD-10-CM | POA: Diagnosis not present

## 2021-02-07 DIAGNOSIS — E1122 Type 2 diabetes mellitus with diabetic chronic kidney disease: Secondary | ICD-10-CM | POA: Diagnosis not present

## 2021-02-08 DIAGNOSIS — E1122 Type 2 diabetes mellitus with diabetic chronic kidney disease: Secondary | ICD-10-CM | POA: Diagnosis not present

## 2021-02-08 DIAGNOSIS — N186 End stage renal disease: Secondary | ICD-10-CM | POA: Diagnosis not present

## 2021-02-08 DIAGNOSIS — Z992 Dependence on renal dialysis: Secondary | ICD-10-CM | POA: Diagnosis not present

## 2021-02-08 DIAGNOSIS — L299 Pruritus, unspecified: Secondary | ICD-10-CM | POA: Diagnosis not present

## 2021-02-08 DIAGNOSIS — R197 Diarrhea, unspecified: Secondary | ICD-10-CM | POA: Diagnosis not present

## 2021-02-08 DIAGNOSIS — N2581 Secondary hyperparathyroidism of renal origin: Secondary | ICD-10-CM | POA: Diagnosis not present

## 2021-02-10 DIAGNOSIS — N2581 Secondary hyperparathyroidism of renal origin: Secondary | ICD-10-CM | POA: Diagnosis not present

## 2021-02-10 DIAGNOSIS — L299 Pruritus, unspecified: Secondary | ICD-10-CM | POA: Diagnosis not present

## 2021-02-10 DIAGNOSIS — R197 Diarrhea, unspecified: Secondary | ICD-10-CM | POA: Diagnosis not present

## 2021-02-10 DIAGNOSIS — E1122 Type 2 diabetes mellitus with diabetic chronic kidney disease: Secondary | ICD-10-CM | POA: Diagnosis not present

## 2021-02-10 DIAGNOSIS — N186 End stage renal disease: Secondary | ICD-10-CM | POA: Diagnosis not present

## 2021-02-10 DIAGNOSIS — Z992 Dependence on renal dialysis: Secondary | ICD-10-CM | POA: Diagnosis not present

## 2021-02-13 DIAGNOSIS — N186 End stage renal disease: Secondary | ICD-10-CM | POA: Diagnosis not present

## 2021-02-13 DIAGNOSIS — R197 Diarrhea, unspecified: Secondary | ICD-10-CM | POA: Diagnosis not present

## 2021-02-13 DIAGNOSIS — L299 Pruritus, unspecified: Secondary | ICD-10-CM | POA: Diagnosis not present

## 2021-02-13 DIAGNOSIS — Z992 Dependence on renal dialysis: Secondary | ICD-10-CM | POA: Diagnosis not present

## 2021-02-13 DIAGNOSIS — N2581 Secondary hyperparathyroidism of renal origin: Secondary | ICD-10-CM | POA: Diagnosis not present

## 2021-02-13 DIAGNOSIS — E1122 Type 2 diabetes mellitus with diabetic chronic kidney disease: Secondary | ICD-10-CM | POA: Diagnosis not present

## 2021-02-14 ENCOUNTER — Ambulatory Visit (INDEPENDENT_AMBULATORY_CARE_PROVIDER_SITE_OTHER): Payer: Medicare Other | Admitting: Adult Health

## 2021-02-14 ENCOUNTER — Encounter: Payer: Self-pay | Admitting: Adult Health

## 2021-02-14 VITALS — BP 162/86 | HR 72 | Ht 71.0 in | Wt 170.0 lb

## 2021-02-14 DIAGNOSIS — E11649 Type 2 diabetes mellitus with hypoglycemia without coma: Secondary | ICD-10-CM | POA: Diagnosis not present

## 2021-02-14 DIAGNOSIS — E1159 Type 2 diabetes mellitus with other circulatory complications: Secondary | ICD-10-CM

## 2021-02-14 DIAGNOSIS — I152 Hypertension secondary to endocrine disorders: Secondary | ICD-10-CM | POA: Diagnosis not present

## 2021-02-14 DIAGNOSIS — Z794 Long term (current) use of insulin: Secondary | ICD-10-CM | POA: Diagnosis not present

## 2021-02-14 DIAGNOSIS — I639 Cerebral infarction, unspecified: Secondary | ICD-10-CM

## 2021-02-14 DIAGNOSIS — I619 Nontraumatic intracerebral hemorrhage, unspecified: Secondary | ICD-10-CM

## 2021-02-14 DIAGNOSIS — E785 Hyperlipidemia, unspecified: Secondary | ICD-10-CM | POA: Diagnosis not present

## 2021-02-14 DIAGNOSIS — G459 Transient cerebral ischemic attack, unspecified: Secondary | ICD-10-CM

## 2021-02-14 MED ORDER — CLOPIDOGREL BISULFATE 75 MG PO TABS: 75.0000 mg | ORAL_TABLET | Freq: Every day | ORAL | 11 refills | Status: DC
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
## 2021-02-14 NOTE — Patient Instructions (Signed)
Recommend stopping aspirin and start Plavix 75mg  daily and continue atorvastatin  for secondary stroke prevention  Repeat lipid panel and A1c today  Will further discuss if further work up is indicated with Dr. Leonie Man such as with cardiac monitoring to rule out Atrial fibrillation - will notify if this is needed  Continue to follow up with PCP regarding cholesterol, blood pressure and diabetes management  Maintain strict control of hypertension with blood pressure goal below 130/90, diabetes with hemoglobin A1c goal below 7.0 % and cholesterol with LDL cholesterol (bad cholesterol) goal below 70 mg/dL.   Signs of a Stroke? Follow the BEFAST method:  Balance Watch for a sudden loss of balance, trouble with coordination or vertigo Eyes Is there a sudden loss of vision in one or both eyes? Or double vision?  Face: Ask the person to smile. Does one side of the face droop or is it numb?  Arms: Ask the person to raise both arms. Does one arm drift downward? Is there weakness or numbness of a leg? Speech: Ask the person to repeat a simple phrase. Does the speech sound slurred/strange? Is the person confused ? Time: If you observe any of these signs, call 911.     Followup in the future with me in 6 months or call earlier if needed       Thank you for coming to see [place] at [facility]. I hope we have been able to provide you high quality care today.  You may receive a patient satisfaction survey over the next few weeks. We would appreciate your feedback and comments so that we may continue to improve ourselves and the health of our patients.

## 2021-02-15 ENCOUNTER — Encounter: Payer: Self-pay | Admitting: Adult Health

## 2021-02-15 ENCOUNTER — Encounter: Payer: Self-pay | Admitting: *Deleted

## 2021-02-15 ENCOUNTER — Telehealth: Payer: Self-pay

## 2021-02-15 DIAGNOSIS — Z992 Dependence on renal dialysis: Secondary | ICD-10-CM | POA: Diagnosis not present

## 2021-02-15 DIAGNOSIS — R197 Diarrhea, unspecified: Secondary | ICD-10-CM | POA: Diagnosis not present

## 2021-02-15 DIAGNOSIS — E1122 Type 2 diabetes mellitus with diabetic chronic kidney disease: Secondary | ICD-10-CM | POA: Diagnosis not present

## 2021-02-15 DIAGNOSIS — L299 Pruritus, unspecified: Secondary | ICD-10-CM | POA: Diagnosis not present

## 2021-02-15 DIAGNOSIS — N2581 Secondary hyperparathyroidism of renal origin: Secondary | ICD-10-CM | POA: Diagnosis not present

## 2021-02-15 DIAGNOSIS — N186 End stage renal disease: Secondary | ICD-10-CM | POA: Diagnosis not present

## 2021-02-15 LAB — LIPID PANEL
Chol/HDL Ratio: 2.1 ratio (ref 0.0–5.0)
Cholesterol, Total: 118 mg/dL (ref 100–199)
HDL: 55 mg/dL (ref >39)
LDL Chol Calc (NIH): 51 mg/dL (ref 0–99)
Triglycerides: 52 mg/dL (ref 0–149)
VLDL Cholesterol Cal: 12 mg/dL (ref 5–40)

## 2021-02-15 LAB — HEMOGLOBIN A1C
Est. average glucose Bld gHb Est-mCnc: 160 mg/dL
Hgb A1c MFr Bld: 7.2 % — ABNORMAL HIGH (ref 4.8–5.6)

## 2021-02-15 NOTE — Telephone Encounter (Signed)
-----   Message from Frann Rider, NP sent at 02/15/2021 12:20 PM EST ----- Please advise patient that recent lab work showed slightly increase of A1c at 7.2. cholesterol levels satisfactory. Currently at SNF who is managing medications and treatment plan. Request they continue to monitor ongoing. Thank you!

## 2021-02-15 NOTE — Progress Notes (Signed)
Patient ID: Jorge [name], male   DOB: [birth year], 51 y.o.   MRN: [id number] Patient enrolled for [employer] to ship a 30 day cardiac event monitor to his address on file. Letter with instructions mailed to patient.

## 2021-02-15 NOTE — Telephone Encounter (Signed)
LVM rq CB

## 2021-02-16 NOTE — Telephone Encounter (Signed)
Contacted pt again, informed him that recent lab work showed slightly increase of A1c at 7.2. cholesterol levels satisfactory. Requested SNF to continue to monitor ongoing. Pt understood and watch his sugar intake and was appreciative.

## 2021-02-17 DIAGNOSIS — N2581 Secondary hyperparathyroidism of renal origin: Secondary | ICD-10-CM | POA: Diagnosis not present

## 2021-02-17 DIAGNOSIS — Z992 Dependence on renal dialysis: Secondary | ICD-10-CM | POA: Diagnosis not present

## 2021-02-17 DIAGNOSIS — L299 Pruritus, unspecified: Secondary | ICD-10-CM | POA: Diagnosis not present

## 2021-02-17 DIAGNOSIS — N186 End stage renal disease: Secondary | ICD-10-CM | POA: Diagnosis not present

## 2021-02-17 DIAGNOSIS — R197 Diarrhea, unspecified: Secondary | ICD-10-CM | POA: Diagnosis not present

## 2021-02-17 DIAGNOSIS — E1122 Type 2 diabetes mellitus with diabetic chronic kidney disease: Secondary | ICD-10-CM | POA: Diagnosis not present

## 2021-02-20 DIAGNOSIS — E1122 Type 2 diabetes mellitus with diabetic chronic kidney disease: Secondary | ICD-10-CM | POA: Diagnosis not present

## 2021-02-20 DIAGNOSIS — Z992 Dependence on renal dialysis: Secondary | ICD-10-CM | POA: Diagnosis not present

## 2021-02-20 DIAGNOSIS — R197 Diarrhea, unspecified: Secondary | ICD-10-CM | POA: Diagnosis not present

## 2021-02-20 DIAGNOSIS — N186 End stage renal disease: Secondary | ICD-10-CM | POA: Diagnosis not present

## 2021-02-20 DIAGNOSIS — L299 Pruritus, unspecified: Secondary | ICD-10-CM | POA: Diagnosis not present

## 2021-02-20 DIAGNOSIS — N2581 Secondary hyperparathyroidism of renal origin: Secondary | ICD-10-CM | POA: Diagnosis not present

## 2021-02-22 DIAGNOSIS — Z992 Dependence on renal dialysis: Secondary | ICD-10-CM | POA: Diagnosis not present

## 2021-02-22 DIAGNOSIS — N2581 Secondary hyperparathyroidism of renal origin: Secondary | ICD-10-CM | POA: Diagnosis not present

## 2021-02-22 DIAGNOSIS — N186 End stage renal disease: Secondary | ICD-10-CM | POA: Diagnosis not present

## 2021-02-22 DIAGNOSIS — R197 Diarrhea, unspecified: Secondary | ICD-10-CM | POA: Diagnosis not present

## 2021-02-22 DIAGNOSIS — E1122 Type 2 diabetes mellitus with diabetic chronic kidney disease: Secondary | ICD-10-CM | POA: Diagnosis not present

## 2021-02-22 DIAGNOSIS — L299 Pruritus, unspecified: Secondary | ICD-10-CM | POA: Diagnosis not present

## 2021-02-24 DIAGNOSIS — R197 Diarrhea, unspecified: Secondary | ICD-10-CM | POA: Diagnosis not present

## 2021-02-24 DIAGNOSIS — Z992 Dependence on renal dialysis: Secondary | ICD-10-CM | POA: Diagnosis not present

## 2021-02-24 DIAGNOSIS — E1122 Type 2 diabetes mellitus with diabetic chronic kidney disease: Secondary | ICD-10-CM | POA: Diagnosis not present

## 2021-02-24 DIAGNOSIS — N186 End stage renal disease: Secondary | ICD-10-CM | POA: Diagnosis not present

## 2021-02-24 DIAGNOSIS — L299 Pruritus, unspecified: Secondary | ICD-10-CM | POA: Diagnosis not present

## 2021-02-24 DIAGNOSIS — N2581 Secondary hyperparathyroidism of renal origin: Secondary | ICD-10-CM | POA: Diagnosis not present

## 2021-02-27 DIAGNOSIS — L299 Pruritus, unspecified: Secondary | ICD-10-CM | POA: Diagnosis not present

## 2021-02-27 DIAGNOSIS — N2581 Secondary hyperparathyroidism of renal origin: Secondary | ICD-10-CM | POA: Diagnosis not present

## 2021-02-27 DIAGNOSIS — N186 End stage renal disease: Secondary | ICD-10-CM | POA: Diagnosis not present

## 2021-02-27 DIAGNOSIS — R197 Diarrhea, unspecified: Secondary | ICD-10-CM | POA: Diagnosis not present

## 2021-02-27 DIAGNOSIS — Z992 Dependence on renal dialysis: Secondary | ICD-10-CM | POA: Diagnosis not present

## 2021-02-27 DIAGNOSIS — E1122 Type 2 diabetes mellitus with diabetic chronic kidney disease: Secondary | ICD-10-CM | POA: Diagnosis not present

## 2021-03-01 DIAGNOSIS — R197 Diarrhea, unspecified: Secondary | ICD-10-CM | POA: Diagnosis not present

## 2021-03-01 DIAGNOSIS — Z992 Dependence on renal dialysis: Secondary | ICD-10-CM | POA: Diagnosis not present

## 2021-03-01 DIAGNOSIS — L299 Pruritus, unspecified: Secondary | ICD-10-CM | POA: Diagnosis not present

## 2021-03-01 DIAGNOSIS — N186 End stage renal disease: Secondary | ICD-10-CM | POA: Diagnosis not present

## 2021-03-01 DIAGNOSIS — N2581 Secondary hyperparathyroidism of renal origin: Secondary | ICD-10-CM | POA: Diagnosis not present

## 2021-03-01 DIAGNOSIS — E1122 Type 2 diabetes mellitus with diabetic chronic kidney disease: Secondary | ICD-10-CM | POA: Diagnosis not present

## 2021-03-02 ENCOUNTER — Other Ambulatory Visit: Payer: Self-pay

## 2021-03-02 ENCOUNTER — Encounter (INDEPENDENT_AMBULATORY_CARE_PROVIDER_SITE_OTHER): Payer: Medicare Other | Admitting: Ophthalmology

## 2021-03-02 DIAGNOSIS — H35033 Hypertensive retinopathy, bilateral: Secondary | ICD-10-CM | POA: Diagnosis not present

## 2021-03-02 DIAGNOSIS — E103513 Type 1 diabetes mellitus with proliferative diabetic retinopathy with macular edema, bilateral: Secondary | ICD-10-CM

## 2021-03-02 DIAGNOSIS — H43813 Vitreous degeneration, bilateral: Secondary | ICD-10-CM

## 2021-03-02 DIAGNOSIS — I10 Essential (primary) hypertension: Secondary | ICD-10-CM | POA: Diagnosis not present

## 2021-03-02 DIAGNOSIS — H2511 Age-related nuclear cataract, right eye: Secondary | ICD-10-CM | POA: Diagnosis not present

## 2021-03-03 DIAGNOSIS — N2581 Secondary hyperparathyroidism of renal origin: Secondary | ICD-10-CM | POA: Diagnosis not present

## 2021-03-03 DIAGNOSIS — N186 End stage renal disease: Secondary | ICD-10-CM | POA: Diagnosis not present

## 2021-03-03 DIAGNOSIS — Z992 Dependence on renal dialysis: Secondary | ICD-10-CM | POA: Diagnosis not present

## 2021-03-03 DIAGNOSIS — E1122 Type 2 diabetes mellitus with diabetic chronic kidney disease: Secondary | ICD-10-CM | POA: Diagnosis not present

## 2021-03-03 DIAGNOSIS — L299 Pruritus, unspecified: Secondary | ICD-10-CM | POA: Diagnosis not present

## 2021-03-03 DIAGNOSIS — R197 Diarrhea, unspecified: Secondary | ICD-10-CM | POA: Diagnosis not present

## 2021-03-06 ENCOUNTER — Other Ambulatory Visit (HOSPITAL_COMMUNITY): Payer: Self-pay

## 2021-03-06 DIAGNOSIS — L299 Pruritus, unspecified: Secondary | ICD-10-CM | POA: Diagnosis not present

## 2021-03-06 DIAGNOSIS — N186 End stage renal disease: Secondary | ICD-10-CM | POA: Diagnosis not present

## 2021-03-06 DIAGNOSIS — N2581 Secondary hyperparathyroidism of renal origin: Secondary | ICD-10-CM | POA: Diagnosis not present

## 2021-03-06 DIAGNOSIS — R197 Diarrhea, unspecified: Secondary | ICD-10-CM | POA: Diagnosis not present

## 2021-03-06 DIAGNOSIS — E1122 Type 2 diabetes mellitus with diabetic chronic kidney disease: Secondary | ICD-10-CM | POA: Diagnosis not present

## 2021-03-06 DIAGNOSIS — Z992 Dependence on renal dialysis: Secondary | ICD-10-CM | POA: Diagnosis not present

## 2021-03-07 DIAGNOSIS — Z992 Dependence on renal dialysis: Secondary | ICD-10-CM | POA: Diagnosis not present

## 2021-03-07 DIAGNOSIS — N186 End stage renal disease: Secondary | ICD-10-CM | POA: Diagnosis not present

## 2021-03-07 DIAGNOSIS — E1122 Type 2 diabetes mellitus with diabetic chronic kidney disease: Secondary | ICD-10-CM | POA: Diagnosis not present

## 2021-03-08 DIAGNOSIS — N2581 Secondary hyperparathyroidism of renal origin: Secondary | ICD-10-CM | POA: Diagnosis not present

## 2021-03-08 DIAGNOSIS — Z992 Dependence on renal dialysis: Secondary | ICD-10-CM | POA: Diagnosis not present

## 2021-03-08 DIAGNOSIS — R197 Diarrhea, unspecified: Secondary | ICD-10-CM | POA: Diagnosis not present

## 2021-03-08 DIAGNOSIS — E1122 Type 2 diabetes mellitus with diabetic chronic kidney disease: Secondary | ICD-10-CM | POA: Diagnosis not present

## 2021-03-08 DIAGNOSIS — N186 End stage renal disease: Secondary | ICD-10-CM | POA: Diagnosis not present

## 2021-03-10 DIAGNOSIS — N186 End stage renal disease: Secondary | ICD-10-CM | POA: Diagnosis not present

## 2021-03-10 DIAGNOSIS — N2581 Secondary hyperparathyroidism of renal origin: Secondary | ICD-10-CM | POA: Diagnosis not present

## 2021-03-10 DIAGNOSIS — R197 Diarrhea, unspecified: Secondary | ICD-10-CM | POA: Diagnosis not present

## 2021-03-10 DIAGNOSIS — E1122 Type 2 diabetes mellitus with diabetic chronic kidney disease: Secondary | ICD-10-CM | POA: Diagnosis not present

## 2021-03-10 DIAGNOSIS — Z992 Dependence on renal dialysis: Secondary | ICD-10-CM | POA: Diagnosis not present

## 2021-03-13 DIAGNOSIS — N2581 Secondary hyperparathyroidism of renal origin: Secondary | ICD-10-CM | POA: Diagnosis not present

## 2021-03-13 DIAGNOSIS — R197 Diarrhea, unspecified: Secondary | ICD-10-CM | POA: Diagnosis not present

## 2021-03-13 DIAGNOSIS — E1122 Type 2 diabetes mellitus with diabetic chronic kidney disease: Secondary | ICD-10-CM | POA: Diagnosis not present

## 2021-03-13 DIAGNOSIS — Z992 Dependence on renal dialysis: Secondary | ICD-10-CM | POA: Diagnosis not present

## 2021-03-13 DIAGNOSIS — N186 End stage renal disease: Secondary | ICD-10-CM | POA: Diagnosis not present

## 2021-03-15 DIAGNOSIS — Z992 Dependence on renal dialysis: Secondary | ICD-10-CM | POA: Diagnosis not present

## 2021-03-15 DIAGNOSIS — E1122 Type 2 diabetes mellitus with diabetic chronic kidney disease: Secondary | ICD-10-CM | POA: Diagnosis not present

## 2021-03-15 DIAGNOSIS — N186 End stage renal disease: Secondary | ICD-10-CM | POA: Diagnosis not present

## 2021-03-15 DIAGNOSIS — R197 Diarrhea, unspecified: Secondary | ICD-10-CM | POA: Diagnosis not present

## 2021-03-15 DIAGNOSIS — N2581 Secondary hyperparathyroidism of renal origin: Secondary | ICD-10-CM | POA: Diagnosis not present

## 2021-03-17 DIAGNOSIS — Z992 Dependence on renal dialysis: Secondary | ICD-10-CM | POA: Diagnosis not present

## 2021-03-17 DIAGNOSIS — E1122 Type 2 diabetes mellitus with diabetic chronic kidney disease: Secondary | ICD-10-CM | POA: Diagnosis not present

## 2021-03-17 DIAGNOSIS — R197 Diarrhea, unspecified: Secondary | ICD-10-CM | POA: Diagnosis not present

## 2021-03-17 DIAGNOSIS — N186 End stage renal disease: Secondary | ICD-10-CM | POA: Diagnosis not present

## 2021-03-17 DIAGNOSIS — N2581 Secondary hyperparathyroidism of renal origin: Secondary | ICD-10-CM | POA: Diagnosis not present

## 2021-03-20 DIAGNOSIS — R197 Diarrhea, unspecified: Secondary | ICD-10-CM | POA: Diagnosis not present

## 2021-03-20 DIAGNOSIS — N2581 Secondary hyperparathyroidism of renal origin: Secondary | ICD-10-CM | POA: Diagnosis not present

## 2021-03-20 DIAGNOSIS — N186 End stage renal disease: Secondary | ICD-10-CM | POA: Diagnosis not present

## 2021-03-20 DIAGNOSIS — E1122 Type 2 diabetes mellitus with diabetic chronic kidney disease: Secondary | ICD-10-CM | POA: Diagnosis not present

## 2021-03-20 DIAGNOSIS — Z992 Dependence on renal dialysis: Secondary | ICD-10-CM | POA: Diagnosis not present

## 2021-03-21 DIAGNOSIS — E113312 Type 2 diabetes mellitus with moderate nonproliferative diabetic retinopathy with macular edema, left eye: Secondary | ICD-10-CM | POA: Diagnosis not present

## 2021-03-21 DIAGNOSIS — H31093 Other chorioretinal scars, bilateral: Secondary | ICD-10-CM | POA: Diagnosis not present

## 2021-03-21 DIAGNOSIS — Z961 Presence of intraocular lens: Secondary | ICD-10-CM | POA: Diagnosis not present

## 2021-03-21 DIAGNOSIS — E113391 Type 2 diabetes mellitus with moderate nonproliferative diabetic retinopathy without macular edema, right eye: Secondary | ICD-10-CM | POA: Diagnosis not present

## 2021-03-21 DIAGNOSIS — H16223 Keratoconjunctivitis sicca, not specified as Sjogren's, bilateral: Secondary | ICD-10-CM | POA: Diagnosis not present

## 2021-03-21 DIAGNOSIS — H25811 Combined forms of age-related cataract, right eye: Secondary | ICD-10-CM | POA: Diagnosis not present

## 2021-03-22 DIAGNOSIS — R197 Diarrhea, unspecified: Secondary | ICD-10-CM | POA: Diagnosis not present

## 2021-03-22 DIAGNOSIS — Z992 Dependence on renal dialysis: Secondary | ICD-10-CM | POA: Diagnosis not present

## 2021-03-22 DIAGNOSIS — N186 End stage renal disease: Secondary | ICD-10-CM | POA: Diagnosis not present

## 2021-03-22 DIAGNOSIS — E1122 Type 2 diabetes mellitus with diabetic chronic kidney disease: Secondary | ICD-10-CM | POA: Diagnosis not present

## 2021-03-22 DIAGNOSIS — N2581 Secondary hyperparathyroidism of renal origin: Secondary | ICD-10-CM | POA: Diagnosis not present

## 2021-03-24 DIAGNOSIS — N2581 Secondary hyperparathyroidism of renal origin: Secondary | ICD-10-CM | POA: Diagnosis not present

## 2021-03-24 DIAGNOSIS — E1122 Type 2 diabetes mellitus with diabetic chronic kidney disease: Secondary | ICD-10-CM | POA: Diagnosis not present

## 2021-03-24 DIAGNOSIS — N186 End stage renal disease: Secondary | ICD-10-CM | POA: Diagnosis not present

## 2021-03-24 DIAGNOSIS — R197 Diarrhea, unspecified: Secondary | ICD-10-CM | POA: Diagnosis not present

## 2021-03-24 DIAGNOSIS — Z992 Dependence on renal dialysis: Secondary | ICD-10-CM | POA: Diagnosis not present

## 2021-03-27 DIAGNOSIS — N2581 Secondary hyperparathyroidism of renal origin: Secondary | ICD-10-CM | POA: Diagnosis not present

## 2021-03-27 DIAGNOSIS — N186 End stage renal disease: Secondary | ICD-10-CM | POA: Diagnosis not present

## 2021-03-27 DIAGNOSIS — E1122 Type 2 diabetes mellitus with diabetic chronic kidney disease: Secondary | ICD-10-CM | POA: Diagnosis not present

## 2021-03-27 DIAGNOSIS — Z992 Dependence on renal dialysis: Secondary | ICD-10-CM | POA: Diagnosis not present

## 2021-03-27 DIAGNOSIS — R197 Diarrhea, unspecified: Secondary | ICD-10-CM | POA: Diagnosis not present

## 2021-03-29 DIAGNOSIS — E1122 Type 2 diabetes mellitus with diabetic chronic kidney disease: Secondary | ICD-10-CM | POA: Diagnosis not present

## 2021-03-29 DIAGNOSIS — N186 End stage renal disease: Secondary | ICD-10-CM | POA: Diagnosis not present

## 2021-03-29 DIAGNOSIS — R197 Diarrhea, unspecified: Secondary | ICD-10-CM | POA: Diagnosis not present

## 2021-03-29 DIAGNOSIS — Z992 Dependence on renal dialysis: Secondary | ICD-10-CM | POA: Diagnosis not present

## 2021-03-29 DIAGNOSIS — N2581 Secondary hyperparathyroidism of renal origin: Secondary | ICD-10-CM | POA: Diagnosis not present

## 2021-03-30 ENCOUNTER — Other Ambulatory Visit: Payer: Self-pay

## 2021-03-30 ENCOUNTER — Encounter (INDEPENDENT_AMBULATORY_CARE_PROVIDER_SITE_OTHER): Payer: Medicare Other | Admitting: Ophthalmology

## 2021-03-30 DIAGNOSIS — I10 Essential (primary) hypertension: Secondary | ICD-10-CM

## 2021-03-30 DIAGNOSIS — H35033 Hypertensive retinopathy, bilateral: Secondary | ICD-10-CM

## 2021-03-30 DIAGNOSIS — E103513 Type 1 diabetes mellitus with proliferative diabetic retinopathy with macular edema, bilateral: Secondary | ICD-10-CM | POA: Diagnosis not present

## 2021-03-30 DIAGNOSIS — H43813 Vitreous degeneration, bilateral: Secondary | ICD-10-CM | POA: Diagnosis not present

## 2021-03-31 DIAGNOSIS — E1122 Type 2 diabetes mellitus with diabetic chronic kidney disease: Secondary | ICD-10-CM | POA: Diagnosis not present

## 2021-03-31 DIAGNOSIS — N186 End stage renal disease: Secondary | ICD-10-CM | POA: Diagnosis not present

## 2021-03-31 DIAGNOSIS — R197 Diarrhea, unspecified: Secondary | ICD-10-CM | POA: Diagnosis not present

## 2021-03-31 DIAGNOSIS — N2581 Secondary hyperparathyroidism of renal origin: Secondary | ICD-10-CM | POA: Diagnosis not present

## 2021-03-31 DIAGNOSIS — Z992 Dependence on renal dialysis: Secondary | ICD-10-CM | POA: Diagnosis not present

## 2021-04-03 DIAGNOSIS — N2581 Secondary hyperparathyroidism of renal origin: Secondary | ICD-10-CM | POA: Diagnosis not present

## 2021-04-03 DIAGNOSIS — E1122 Type 2 diabetes mellitus with diabetic chronic kidney disease: Secondary | ICD-10-CM | POA: Diagnosis not present

## 2021-04-03 DIAGNOSIS — R197 Diarrhea, unspecified: Secondary | ICD-10-CM | POA: Diagnosis not present

## 2021-04-03 DIAGNOSIS — N186 End stage renal disease: Secondary | ICD-10-CM | POA: Diagnosis not present

## 2021-04-03 DIAGNOSIS — Z992 Dependence on renal dialysis: Secondary | ICD-10-CM | POA: Diagnosis not present

## 2021-04-05 DIAGNOSIS — N2581 Secondary hyperparathyroidism of renal origin: Secondary | ICD-10-CM | POA: Diagnosis not present

## 2021-04-05 DIAGNOSIS — R197 Diarrhea, unspecified: Secondary | ICD-10-CM | POA: Diagnosis not present

## 2021-04-05 DIAGNOSIS — Z992 Dependence on renal dialysis: Secondary | ICD-10-CM | POA: Diagnosis not present

## 2021-04-05 DIAGNOSIS — N186 End stage renal disease: Secondary | ICD-10-CM | POA: Diagnosis not present

## 2021-04-05 DIAGNOSIS — E1122 Type 2 diabetes mellitus with diabetic chronic kidney disease: Secondary | ICD-10-CM | POA: Diagnosis not present

## 2021-04-06 DIAGNOSIS — I10 Essential (primary) hypertension: Secondary | ICD-10-CM | POA: Diagnosis not present

## 2021-04-06 DIAGNOSIS — N186 End stage renal disease: Secondary | ICD-10-CM | POA: Diagnosis not present

## 2021-04-06 DIAGNOSIS — G546 Phantom limb syndrome with pain: Secondary | ICD-10-CM | POA: Diagnosis not present

## 2021-04-06 DIAGNOSIS — E1122 Type 2 diabetes mellitus with diabetic chronic kidney disease: Secondary | ICD-10-CM | POA: Diagnosis not present

## 2021-04-07 DIAGNOSIS — E1122 Type 2 diabetes mellitus with diabetic chronic kidney disease: Secondary | ICD-10-CM | POA: Diagnosis not present

## 2021-04-07 DIAGNOSIS — R197 Diarrhea, unspecified: Secondary | ICD-10-CM | POA: Diagnosis not present

## 2021-04-07 DIAGNOSIS — N2581 Secondary hyperparathyroidism of renal origin: Secondary | ICD-10-CM | POA: Diagnosis not present

## 2021-04-07 DIAGNOSIS — N186 End stage renal disease: Secondary | ICD-10-CM | POA: Diagnosis not present

## 2021-04-07 DIAGNOSIS — Z992 Dependence on renal dialysis: Secondary | ICD-10-CM | POA: Diagnosis not present

## 2021-04-10 DIAGNOSIS — N2581 Secondary hyperparathyroidism of renal origin: Secondary | ICD-10-CM | POA: Diagnosis not present

## 2021-04-10 DIAGNOSIS — Z992 Dependence on renal dialysis: Secondary | ICD-10-CM | POA: Diagnosis not present

## 2021-04-10 DIAGNOSIS — E1122 Type 2 diabetes mellitus with diabetic chronic kidney disease: Secondary | ICD-10-CM | POA: Diagnosis not present

## 2021-04-10 DIAGNOSIS — R197 Diarrhea, unspecified: Secondary | ICD-10-CM | POA: Diagnosis not present

## 2021-04-10 DIAGNOSIS — N186 End stage renal disease: Secondary | ICD-10-CM | POA: Diagnosis not present

## 2021-04-12 DIAGNOSIS — E1122 Type 2 diabetes mellitus with diabetic chronic kidney disease: Secondary | ICD-10-CM | POA: Diagnosis not present

## 2021-04-12 DIAGNOSIS — N186 End stage renal disease: Secondary | ICD-10-CM | POA: Diagnosis not present

## 2021-04-12 DIAGNOSIS — Z992 Dependence on renal dialysis: Secondary | ICD-10-CM | POA: Diagnosis not present

## 2021-04-12 DIAGNOSIS — N2581 Secondary hyperparathyroidism of renal origin: Secondary | ICD-10-CM | POA: Diagnosis not present

## 2021-04-12 DIAGNOSIS — R197 Diarrhea, unspecified: Secondary | ICD-10-CM | POA: Diagnosis not present

## 2021-04-14 DIAGNOSIS — N2581 Secondary hyperparathyroidism of renal origin: Secondary | ICD-10-CM | POA: Diagnosis not present

## 2021-04-14 DIAGNOSIS — E1122 Type 2 diabetes mellitus with diabetic chronic kidney disease: Secondary | ICD-10-CM | POA: Diagnosis not present

## 2021-04-14 DIAGNOSIS — R197 Diarrhea, unspecified: Secondary | ICD-10-CM | POA: Diagnosis not present

## 2021-04-14 DIAGNOSIS — N186 End stage renal disease: Secondary | ICD-10-CM | POA: Diagnosis not present

## 2021-04-14 DIAGNOSIS — Z992 Dependence on renal dialysis: Secondary | ICD-10-CM | POA: Diagnosis not present

## 2021-04-17 DIAGNOSIS — E1122 Type 2 diabetes mellitus with diabetic chronic kidney disease: Secondary | ICD-10-CM | POA: Diagnosis not present

## 2021-04-17 DIAGNOSIS — R197 Diarrhea, unspecified: Secondary | ICD-10-CM | POA: Diagnosis not present

## 2021-04-17 DIAGNOSIS — N186 End stage renal disease: Secondary | ICD-10-CM | POA: Diagnosis not present

## 2021-04-17 DIAGNOSIS — Z992 Dependence on renal dialysis: Secondary | ICD-10-CM | POA: Diagnosis not present

## 2021-04-17 DIAGNOSIS — N2581 Secondary hyperparathyroidism of renal origin: Secondary | ICD-10-CM | POA: Diagnosis not present

## 2021-04-19 DIAGNOSIS — N2581 Secondary hyperparathyroidism of renal origin: Secondary | ICD-10-CM | POA: Diagnosis not present

## 2021-04-19 DIAGNOSIS — E1122 Type 2 diabetes mellitus with diabetic chronic kidney disease: Secondary | ICD-10-CM | POA: Diagnosis not present

## 2021-04-19 DIAGNOSIS — R197 Diarrhea, unspecified: Secondary | ICD-10-CM | POA: Diagnosis not present

## 2021-04-19 DIAGNOSIS — Z992 Dependence on renal dialysis: Secondary | ICD-10-CM | POA: Diagnosis not present

## 2021-04-19 DIAGNOSIS — N186 End stage renal disease: Secondary | ICD-10-CM | POA: Diagnosis not present

## 2021-04-21 DIAGNOSIS — Z992 Dependence on renal dialysis: Secondary | ICD-10-CM | POA: Diagnosis not present

## 2021-04-21 DIAGNOSIS — N2581 Secondary hyperparathyroidism of renal origin: Secondary | ICD-10-CM | POA: Diagnosis not present

## 2021-04-21 DIAGNOSIS — R197 Diarrhea, unspecified: Secondary | ICD-10-CM | POA: Diagnosis not present

## 2021-04-21 DIAGNOSIS — E1122 Type 2 diabetes mellitus with diabetic chronic kidney disease: Secondary | ICD-10-CM | POA: Diagnosis not present

## 2021-04-21 DIAGNOSIS — N186 End stage renal disease: Secondary | ICD-10-CM | POA: Diagnosis not present

## 2021-04-24 DIAGNOSIS — Z992 Dependence on renal dialysis: Secondary | ICD-10-CM | POA: Diagnosis not present

## 2021-04-24 DIAGNOSIS — E1122 Type 2 diabetes mellitus with diabetic chronic kidney disease: Secondary | ICD-10-CM | POA: Diagnosis not present

## 2021-04-24 DIAGNOSIS — N186 End stage renal disease: Secondary | ICD-10-CM | POA: Diagnosis not present

## 2021-04-24 DIAGNOSIS — R197 Diarrhea, unspecified: Secondary | ICD-10-CM | POA: Diagnosis not present

## 2021-04-24 DIAGNOSIS — N2581 Secondary hyperparathyroidism of renal origin: Secondary | ICD-10-CM | POA: Diagnosis not present

## 2021-04-24 DIAGNOSIS — I10 Essential (primary) hypertension: Secondary | ICD-10-CM | POA: Diagnosis not present

## 2021-04-26 DIAGNOSIS — N186 End stage renal disease: Secondary | ICD-10-CM | POA: Diagnosis not present

## 2021-04-26 DIAGNOSIS — Z992 Dependence on renal dialysis: Secondary | ICD-10-CM | POA: Diagnosis not present

## 2021-04-26 DIAGNOSIS — N2581 Secondary hyperparathyroidism of renal origin: Secondary | ICD-10-CM | POA: Diagnosis not present

## 2021-04-26 DIAGNOSIS — R197 Diarrhea, unspecified: Secondary | ICD-10-CM | POA: Diagnosis not present

## 2021-04-26 DIAGNOSIS — E1122 Type 2 diabetes mellitus with diabetic chronic kidney disease: Secondary | ICD-10-CM | POA: Diagnosis not present

## 2021-04-27 ENCOUNTER — Encounter (INDEPENDENT_AMBULATORY_CARE_PROVIDER_SITE_OTHER): Payer: Medicare Other | Admitting: Ophthalmology

## 2021-04-27 DIAGNOSIS — H35033 Hypertensive retinopathy, bilateral: Secondary | ICD-10-CM

## 2021-04-27 DIAGNOSIS — H2511 Age-related nuclear cataract, right eye: Secondary | ICD-10-CM

## 2021-04-27 DIAGNOSIS — I10 Essential (primary) hypertension: Secondary | ICD-10-CM

## 2021-04-27 DIAGNOSIS — H43813 Vitreous degeneration, bilateral: Secondary | ICD-10-CM | POA: Diagnosis not present

## 2021-04-27 DIAGNOSIS — E103513 Type 1 diabetes mellitus with proliferative diabetic retinopathy with macular edema, bilateral: Secondary | ICD-10-CM

## 2021-04-28 DIAGNOSIS — E1122 Type 2 diabetes mellitus with diabetic chronic kidney disease: Secondary | ICD-10-CM | POA: Diagnosis not present

## 2021-04-28 DIAGNOSIS — I10 Essential (primary) hypertension: Secondary | ICD-10-CM | POA: Diagnosis not present

## 2021-04-28 DIAGNOSIS — N186 End stage renal disease: Secondary | ICD-10-CM | POA: Diagnosis not present

## 2021-04-28 DIAGNOSIS — R197 Diarrhea, unspecified: Secondary | ICD-10-CM | POA: Diagnosis not present

## 2021-04-28 DIAGNOSIS — N2581 Secondary hyperparathyroidism of renal origin: Secondary | ICD-10-CM | POA: Diagnosis not present

## 2021-04-28 DIAGNOSIS — Z992 Dependence on renal dialysis: Secondary | ICD-10-CM | POA: Diagnosis not present

## 2021-05-01 DIAGNOSIS — N186 End stage renal disease: Secondary | ICD-10-CM | POA: Diagnosis not present

## 2021-05-01 DIAGNOSIS — R197 Diarrhea, unspecified: Secondary | ICD-10-CM | POA: Diagnosis not present

## 2021-05-01 DIAGNOSIS — E1122 Type 2 diabetes mellitus with diabetic chronic kidney disease: Secondary | ICD-10-CM | POA: Diagnosis not present

## 2021-05-01 DIAGNOSIS — Z992 Dependence on renal dialysis: Secondary | ICD-10-CM | POA: Diagnosis not present

## 2021-05-01 DIAGNOSIS — N2581 Secondary hyperparathyroidism of renal origin: Secondary | ICD-10-CM | POA: Diagnosis not present

## 2021-05-03 DIAGNOSIS — E1122 Type 2 diabetes mellitus with diabetic chronic kidney disease: Secondary | ICD-10-CM | POA: Diagnosis not present

## 2021-05-03 DIAGNOSIS — N186 End stage renal disease: Secondary | ICD-10-CM | POA: Diagnosis not present

## 2021-05-03 DIAGNOSIS — Z992 Dependence on renal dialysis: Secondary | ICD-10-CM | POA: Diagnosis not present

## 2021-05-03 DIAGNOSIS — R197 Diarrhea, unspecified: Secondary | ICD-10-CM | POA: Diagnosis not present

## 2021-05-03 DIAGNOSIS — N2581 Secondary hyperparathyroidism of renal origin: Secondary | ICD-10-CM | POA: Diagnosis not present

## 2021-05-05 DIAGNOSIS — N186 End stage renal disease: Secondary | ICD-10-CM | POA: Diagnosis not present

## 2021-05-05 DIAGNOSIS — R197 Diarrhea, unspecified: Secondary | ICD-10-CM | POA: Diagnosis not present

## 2021-05-05 DIAGNOSIS — E1122 Type 2 diabetes mellitus with diabetic chronic kidney disease: Secondary | ICD-10-CM | POA: Diagnosis not present

## 2021-05-05 DIAGNOSIS — Z992 Dependence on renal dialysis: Secondary | ICD-10-CM | POA: Diagnosis not present

## 2021-05-05 DIAGNOSIS — N2581 Secondary hyperparathyroidism of renal origin: Secondary | ICD-10-CM | POA: Diagnosis not present

## 2021-05-07 DIAGNOSIS — Z992 Dependence on renal dialysis: Secondary | ICD-10-CM | POA: Diagnosis not present

## 2021-05-07 DIAGNOSIS — N186 End stage renal disease: Secondary | ICD-10-CM | POA: Diagnosis not present

## 2021-05-07 DIAGNOSIS — E1122 Type 2 diabetes mellitus with diabetic chronic kidney disease: Secondary | ICD-10-CM | POA: Diagnosis not present

## 2021-05-08 ENCOUNTER — Emergency Department (HOSPITAL_COMMUNITY): Payer: Medicare Other

## 2021-05-08 ENCOUNTER — Encounter (HOSPITAL_COMMUNITY): Payer: Self-pay | Admitting: Emergency Medicine

## 2021-05-08 ENCOUNTER — Other Ambulatory Visit: Payer: Self-pay

## 2021-05-08 ENCOUNTER — Inpatient Hospital Stay (HOSPITAL_COMMUNITY)
Admission: EM | Admit: 2021-05-08 | Discharge: 2021-05-13 | DRG: 177 | Disposition: A | Discharge status: Skilled Nursing Facility | Payer: Medicare Other | Attending: Family Medicine | Admitting: Family Medicine

## 2021-05-08 DIAGNOSIS — Z992 Dependence on renal dialysis: Secondary | ICD-10-CM | POA: Diagnosis not present

## 2021-05-08 DIAGNOSIS — Z833 Family history of diabetes mellitus: Secondary | ICD-10-CM | POA: Diagnosis not present

## 2021-05-08 DIAGNOSIS — Z8249 Family history of ischemic heart disease and other diseases of the circulatory system: Secondary | ICD-10-CM

## 2021-05-08 DIAGNOSIS — Z823 Family history of stroke: Secondary | ICD-10-CM | POA: Diagnosis not present

## 2021-05-08 DIAGNOSIS — R5383 Other fatigue: Secondary | ICD-10-CM | POA: Diagnosis not present

## 2021-05-08 DIAGNOSIS — E785 Hyperlipidemia, unspecified: Secondary | ICD-10-CM | POA: Diagnosis not present

## 2021-05-08 DIAGNOSIS — N186 End stage renal disease: Secondary | ICD-10-CM

## 2021-05-08 DIAGNOSIS — E1151 Type 2 diabetes mellitus with diabetic peripheral angiopathy without gangrene: Secondary | ICD-10-CM | POA: Diagnosis present

## 2021-05-08 DIAGNOSIS — Z79899 Other long term (current) drug therapy: Secondary | ICD-10-CM | POA: Diagnosis not present

## 2021-05-08 DIAGNOSIS — Z87891 Personal history of nicotine dependence: Secondary | ICD-10-CM

## 2021-05-08 DIAGNOSIS — R590 Localized enlarged lymph nodes: Secondary | ICD-10-CM | POA: Diagnosis not present

## 2021-05-08 DIAGNOSIS — L299 Pruritus, unspecified: Secondary | ICD-10-CM | POA: Diagnosis not present

## 2021-05-08 DIAGNOSIS — Z794 Long term (current) use of insulin: Secondary | ICD-10-CM

## 2021-05-08 DIAGNOSIS — G9340 Encephalopathy, unspecified: Secondary | ICD-10-CM | POA: Diagnosis present

## 2021-05-08 DIAGNOSIS — R509 Fever, unspecified: Secondary | ICD-10-CM | POA: Diagnosis not present

## 2021-05-08 DIAGNOSIS — D509 Iron deficiency anemia, unspecified: Secondary | ICD-10-CM | POA: Diagnosis not present

## 2021-05-08 DIAGNOSIS — D631 Anemia in chronic kidney disease: Secondary | ICD-10-CM | POA: Diagnosis present

## 2021-05-08 DIAGNOSIS — Z7902 Long term (current) use of antithrombotics/antiplatelets: Secondary | ICD-10-CM | POA: Diagnosis not present

## 2021-05-08 DIAGNOSIS — I739 Peripheral vascular disease, unspecified: Secondary | ICD-10-CM | POA: Diagnosis present

## 2021-05-08 DIAGNOSIS — G9341 Metabolic encephalopathy: Secondary | ICD-10-CM | POA: Diagnosis present

## 2021-05-08 DIAGNOSIS — R197 Diarrhea, unspecified: Secondary | ICD-10-CM

## 2021-05-08 DIAGNOSIS — E1169 Type 2 diabetes mellitus with other specified complication: Secondary | ICD-10-CM | POA: Diagnosis not present

## 2021-05-08 DIAGNOSIS — Z8673 Personal history of transient ischemic attack (TIA), and cerebral infarction without residual deficits: Secondary | ICD-10-CM

## 2021-05-08 DIAGNOSIS — D696 Thrombocytopenia, unspecified: Secondary | ICD-10-CM | POA: Diagnosis present

## 2021-05-08 DIAGNOSIS — E1122 Type 2 diabetes mellitus with diabetic chronic kidney disease: Secondary | ICD-10-CM

## 2021-05-08 DIAGNOSIS — E739 Lactose intolerance, unspecified: Secondary | ICD-10-CM | POA: Diagnosis present

## 2021-05-08 DIAGNOSIS — U071 COVID-19: Secondary | ICD-10-CM | POA: Diagnosis not present

## 2021-05-08 DIAGNOSIS — I959 Hypotension, unspecified: Secondary | ICD-10-CM | POA: Diagnosis not present

## 2021-05-08 DIAGNOSIS — Z89511 Acquired absence of right leg below knee: Secondary | ICD-10-CM | POA: Diagnosis not present

## 2021-05-08 DIAGNOSIS — A419 Sepsis, unspecified organism: Secondary | ICD-10-CM | POA: Diagnosis not present

## 2021-05-08 DIAGNOSIS — E11649 Type 2 diabetes mellitus with hypoglycemia without coma: Secondary | ICD-10-CM | POA: Diagnosis not present

## 2021-05-08 DIAGNOSIS — Z89512 Acquired absence of left leg below knee: Secondary | ICD-10-CM

## 2021-05-08 DIAGNOSIS — R400 Somnolence: Secondary | ICD-10-CM | POA: Diagnosis not present

## 2021-05-08 DIAGNOSIS — J9601 Acute respiratory failure with hypoxia: Secondary | ICD-10-CM | POA: Diagnosis not present

## 2021-05-08 DIAGNOSIS — M898X9 Other specified disorders of bone, unspecified site: Secondary | ICD-10-CM | POA: Diagnosis present

## 2021-05-08 DIAGNOSIS — I120 Hypertensive chronic kidney disease with stage 5 chronic kidney disease or end stage renal disease: Secondary | ICD-10-CM | POA: Diagnosis present

## 2021-05-08 DIAGNOSIS — N2581 Secondary hyperparathyroidism of renal origin: Secondary | ICD-10-CM | POA: Diagnosis not present

## 2021-05-08 HISTORY — DX: Dependence on renal dialysis: Z99.2

## 2021-05-08 HISTORY — DX: End stage renal disease: N18.6

## 2021-05-08 LAB — CBC WITH DIFFERENTIAL/PLATELET
Abs Immature Granulocytes: 0.07 10*3/uL (ref 0.00–0.07)
Basophils Absolute: 0 10*3/uL (ref 0.0–0.1)
Basophils Relative: 1 %
Eosinophils Absolute: 0.4 10*3/uL (ref 0.0–0.5)
Eosinophils Relative: 5 %
HCT: 30.7 % — ABNORMAL LOW (ref 39.0–52.0)
Hemoglobin: 9.6 g/dL — ABNORMAL LOW (ref 13.0–17.0)
Immature Granulocytes: 1 %
Lymphocytes Relative: 17 %
Lymphs Abs: 1.2 10*3/uL (ref 0.7–4.0)
MCH: 31.2 pg (ref 26.0–34.0)
MCHC: 31.3 g/dL (ref 30.0–36.0)
MCV: 99.7 fL (ref 80.0–100.0)
Monocytes Absolute: 0.9 10*3/uL (ref 0.1–1.0)
Monocytes Relative: 12 %
Neutro Abs: 4.6 10*3/uL (ref 1.7–7.7)
Neutrophils Relative %: 64 %
Platelets: 113 10*3/uL — ABNORMAL LOW (ref 150–400)
RBC: 3.08 MIL/uL — ABNORMAL LOW (ref 4.22–5.81)
RDW: 16 % — ABNORMAL HIGH (ref 11.5–15.5)
WBC: 7.1 10*3/uL (ref 4.0–10.5)
nRBC: 0 % (ref 0.0–0.2)

## 2021-05-08 LAB — RESP PANEL BY RT-PCR (FLU A&B, COVID) ARPGX2
Influenza A by PCR: NEGATIVE
Influenza B by PCR: NEGATIVE
SARS Coronavirus 2 by RT PCR: POSITIVE — AB

## 2021-05-08 LAB — PROTIME-INR
INR: 1.5 — ABNORMAL HIGH (ref 0.8–1.2)
Prothrombin Time: 17.7 seconds — ABNORMAL HIGH (ref 11.4–15.2)

## 2021-05-08 LAB — LACTIC ACID, PLASMA
Lactic Acid, Venous: 3.2 mmol/L (ref 0.5–1.9)
Lactic Acid, Venous: 0.9 mmol/L (ref 0.5–1.9)

## 2021-05-08 LAB — COMPREHENSIVE METABOLIC PANEL
ALT: 12 U/L (ref 0–44)
AST: 25 U/L (ref 15–41)
Albumin: 4 g/dL (ref 3.5–5.0)
Alkaline Phosphatase: 120 U/L (ref 38–126)
Anion gap: 13 (ref 5–15)
BUN: 25 mg/dL — ABNORMAL HIGH (ref 6–20)
CO2: 30 mmol/L (ref 22–32)
Calcium: 8.8 mg/dL — ABNORMAL LOW (ref 8.9–10.3)
Chloride: 97 mmol/L — ABNORMAL LOW (ref 98–111)
Creatinine, Ser: 5.91 mg/dL — ABNORMAL HIGH (ref 0.61–1.24)
GFR, Estimated: 11 mL/min — ABNORMAL LOW (ref >60)
Glucose, Bld: 128 mg/dL — ABNORMAL HIGH (ref 70–99)
Potassium: 4.6 mmol/L (ref 3.5–5.1)
Sodium: 140 mmol/L (ref 135–145)
Total Bilirubin: 1 mg/dL (ref 0.3–1.2)
Total Protein: 8.3 g/dL — ABNORMAL HIGH (ref 6.5–8.1)

## 2021-05-08 LAB — BLOOD GAS, VENOUS
Acid-Base Excess: 7.7 mmol/L — ABNORMAL HIGH (ref 0.0–2.0)
Bicarbonate: 34.9 mmol/L — ABNORMAL HIGH (ref 20.0–28.0)
O2 Saturation: 55 %
Patient temperature: 37
pCO2, Ven: 59 mmHg (ref 44–60)
pH, Ven: 7.38 (ref 7.25–7.43)
pO2, Ven: 31 mmHg — CL (ref 32–45)

## 2021-05-08 LAB — APTT: aPTT: 31 seconds (ref 24–36)

## 2021-05-08 LAB — CBG MONITORING, ED: Glucose-Capillary: 129 mg/dL — ABNORMAL HIGH (ref 70–99)

## 2021-05-08 LAB — LIPASE, BLOOD: Lipase: 21 U/L (ref 11–51)

## 2021-05-08 IMAGING — DX DG CHEST 1V PORT
1 series · 1 of 1 positions shown · non-contrast
Comparison: [DATE]

CLINICAL DATA: Sepsis

EXAM:
PORTABLE CHEST 1 VIEW

[chest ap]
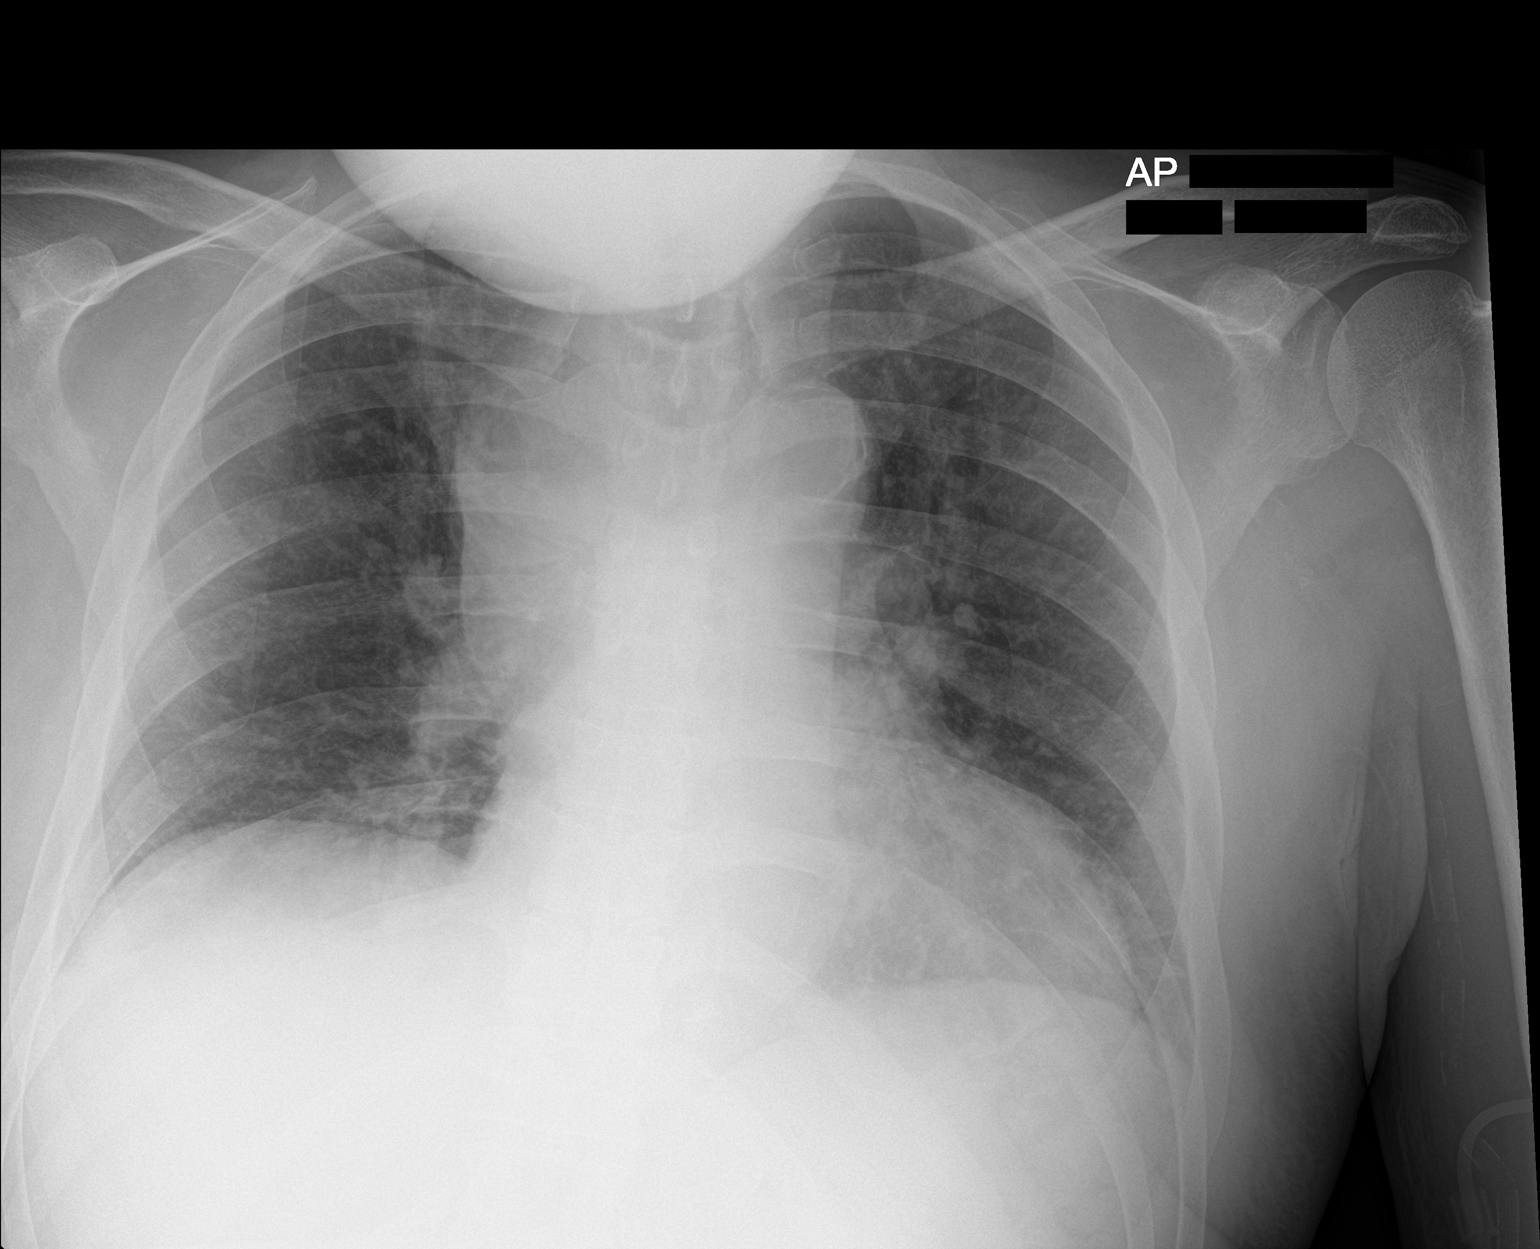

[1 of 1 positions shown; findings below may reference images not displayed]

FINDINGS: The heart size and mediastinal contours are within normal limits.
Both lungs are clear. The visualized skeletal structures are
unremarkable.
IMPRESSION: No active disease.

## 2021-05-08 MED ORDER — ACETAMINOPHEN 650 MG RE SUPP
RECTAL | Status: AC
  Administered 2021-05-08: 650 mg via RECTAL
  Filled 2021-05-08: qty 1

## 2021-05-08 MED ORDER — CLOPIDOGREL BISULFATE 75 MG PO TABS
75.0000 mg | ORAL_TABLET | Freq: Every day | ORAL | Status: DC
  Administered 2021-05-09 – 2021-05-13 (×5): 75 mg via ORAL
  Filled 2021-05-08 (×5): qty 1

## 2021-05-08 MED ORDER — ACETAMINOPHEN 325 MG PO TABS
650.0000 mg | ORAL_TABLET | Freq: Four times a day (QID) | ORAL | Status: DC | PRN
  Administered 2021-05-10: 650 mg via ORAL
  Filled 2021-05-08: qty 2

## 2021-05-08 MED ORDER — SODIUM CHLORIDE 0.9 % IV SOLN
2.0000 g | INTRAVENOUS | Status: DC
  Administered 2021-05-08: 2 g via INTRAVENOUS
  Filled 2021-05-08: qty 20

## 2021-05-08 MED ORDER — ONDANSETRON HCL 4 MG PO TABS: 4.0000 mg | ORAL_TABLET | Freq: Four times a day (QID) | ORAL | Status: DC | PRN

## 2021-05-08 MED ORDER — SEVELAMER CARBONATE 800 MG PO TABS
1600.0000 mg | ORAL_TABLET | ORAL | Status: DC
  Administered 2021-05-10: 1600 mg via ORAL
  Filled 2021-05-08 (×5): qty 2

## 2021-05-08 MED ORDER — SODIUM CHLORIDE 0.9 % IV BOLUS
1000.0000 mL | Freq: Once | INTRAVENOUS | Status: AC
  Administered 2021-05-08: 1000 mL via INTRAVENOUS

## 2021-05-08 MED ORDER — IPRATROPIUM-ALBUTEROL 20-100 MCG/ACT IN AERS: 1.0000 | INHALATION_SPRAY | Freq: Four times a day (QID) | RESPIRATORY_TRACT | Status: DC | PRN

## 2021-05-08 MED ORDER — SEVELAMER CARBONATE 800 MG PO TABS
3200.0000 mg | ORAL_TABLET | ORAL | Status: DC
  Administered 2021-05-09 – 2021-05-13 (×10): 3200 mg via ORAL
  Filled 2021-05-08 (×12): qty 4

## 2021-05-08 MED ORDER — HEPARIN SODIUM (PORCINE) 5000 UNIT/ML IJ SOLN
5000.0000 [IU] | Freq: Three times a day (TID) | INTRAMUSCULAR | Status: DC
  Administered 2021-05-08 – 2021-05-13 (×13): 5000 [IU] via SUBCUTANEOUS
  Filled 2021-05-08 (×11): qty 1

## 2021-05-08 MED ORDER — FENTANYL CITRATE PF 50 MCG/ML IJ SOSY
PREFILLED_SYRINGE | INTRAMUSCULAR | Status: AC
  Administered 2021-05-08: 100 ug via INTRAMUSCULAR
  Filled 2021-05-08: qty 2

## 2021-05-08 MED ORDER — ACETAMINOPHEN 650 MG RE SUPP: 650.0000 mg | Freq: Once | RECTAL | Status: AC

## 2021-05-08 MED ORDER — ATORVASTATIN CALCIUM 40 MG PO TABS
40.0000 mg | ORAL_TABLET | Freq: Every day | ORAL | Status: DC
Start: 2021-05-09 — End: 2021-05-13
  Administered 2021-05-09 – 2021-05-13 (×5): 40 mg via ORAL
  Filled 2021-05-08 (×5): qty 1

## 2021-05-08 MED ORDER — INSULIN ASPART 100 UNIT/ML IJ SOLN
0.0000 [IU] | Freq: Three times a day (TID) | INTRAMUSCULAR | Status: DC
  Administered 2021-05-11 (×2): 1 [IU] via SUBCUTANEOUS
  Administered 2021-05-12: 2 [IU] via SUBCUTANEOUS
  Filled 2021-05-08: qty 0.06

## 2021-05-08 MED ORDER — SALINE SPRAY 0.65 % NA SOLN
1.0000 | NASAL | Status: DC
  Administered 2021-05-08 – 2021-05-09 (×3): 2 via NASAL
  Administered 2021-05-09: 1 via NASAL
  Administered 2021-05-09: 2 via NASAL
  Administered 2021-05-09 (×2): 1 via NASAL
  Administered 2021-05-09 – 2021-05-11 (×18): 2 via NASAL
  Administered 2021-05-12: 1 via NASAL
  Administered 2021-05-12 – 2021-05-13 (×9): 2 via NASAL
  Administered 2021-05-13: 1 via NASAL
  Filled 2021-05-08 (×2): qty 44

## 2021-05-08 MED ORDER — ACETAMINOPHEN 325 MG RE SUPP
RECTAL | Status: AC
  Administered 2021-05-08: 325 mg
  Filled 2021-05-08: qty 1

## 2021-05-08 MED ORDER — LACTATED RINGERS IV SOLN: INTRAVENOUS | Status: DC

## 2021-05-08 MED ORDER — FENTANYL CITRATE PF 50 MCG/ML IJ SOSY: 100.0000 ug | PREFILLED_SYRINGE | Freq: Once | INTRAMUSCULAR | Status: AC

## 2021-05-08 MED ORDER — SODIUM CHLORIDE 0.9 % IV SOLN
500.0000 mg | INTRAVENOUS | Status: DC
  Administered 2021-05-08: 500 mg via INTRAVENOUS
  Filled 2021-05-08: qty 5

## 2021-05-08 MED ORDER — MOLNUPIRAVIR EUA 200MG CAPSULE
4.0000 | ORAL_CAPSULE | Freq: Two times a day (BID) | ORAL | Status: DC
  Administered 2021-05-08 – 2021-05-13 (×9): 800 mg via ORAL
  Filled 2021-05-08 (×3): qty 4

## 2021-05-08 MED ORDER — ONDANSETRON HCL 4 MG/2ML IJ SOLN: 4.0000 mg | Freq: Four times a day (QID) | INTRAMUSCULAR | Status: DC | PRN

## 2021-05-08 NOTE — Assessment & Plan Note (Signed)
New thrombocytopenia with platelets down to 113,000.  Likely secondary to acute COVID-19 viral illness.  Continue to monitor. ?

## 2021-05-08 NOTE — ED Provider Notes (Signed)
?[facility] DEPT ?Provider Note ? ? ?CSN: [id number] ?Arrival date & time: 05/08/21  1620 ? ?  ? ?History ? ?Chief Complaint  ?Patient presents with  ? Fatigue  ? ? ?Levonte PRATIK DALZIEL is a 51 y.o. male. ? ?Per EMS patient was at dialysis and brought here for excessive sleepiness.  He had normal vitals with EMS.  History of end-stage renal disease and diabetes.  Blood sugar within normal limits with EMS.  He has had coughing congestion.  Patient overall keeps telling me to stop asking him questions wants to be left alone.  He denies any abdominal pain, nausea, vomiting, diarrhea.  Patient has history of bilateral BKA.  Unsure of how much dialysis he got through. ? ?The history is provided by the patient.  ? ?  ? ?Home Medications ?Prior to Admission medications   ?Medication Sig Start Date End Date Taking? Authorizing Provider  ?atorvastatin (LIPITOR) 40 MG tablet Take 1 tablet (40 mg total) by mouth daily. 07/30/20   Little Ishikawa, MD  ?clopidogrel (PLAVIX) 75 MG tablet Take 1 tablet (75 mg total) by mouth daily. 02/14/21   Frann Rider, NP  ?diclofenac Sodium (VOLTAREN) 1 % GEL Apply 1 application topically 4 (four) times daily. 01/29/21   [provider]  ?Ensure (ENSURE) Take 237 mLs by mouth 2 (two) times daily between meals.    [provider]  ?furosemide (LASIX) 20 MG tablet Take 20 mg by mouth 2 (two) times daily. 01/13/21   [provider]  ?glucagon (GLUCAGEN) 1 MG SOLR injection Inject 1 mg into the vein once as needed for low blood sugar.    [provider]  ?Glucagon, rDNA, (GLUCAGON EMERGENCY) 1 MG KIT 1 mg. 04/24/21   [provider]  ?HUMALOG KWIKPEN 100 UNIT/ML KwikPen Inject 0-10 Units into the skin See admin instructions. Per sliding scale 3 times daily. ?0-150= 0 units ?151-200= 2 units ?201-250= 4 units ?251-300= 6 units ?301-350= 8 units ?351-400= 10 units ?<70 or >400 call PCP 06/22/20   [provider]  ?LANTUS  SOLOSTAR 100 UNIT/ML Solostar Pen Inject into the skin. 12/17/20   [provider]  ?LOPERAMIDE HCL PO Take 2 mg by mouth. 08/05/20 08/04/21  [provider]  ?LORazepam (ATIVAN) 0.5 MG tablet Take 0.5 mg by mouth daily as needed. 10/28/20   [provider]  ?multivitamin (RENA-VIT) TABS tablet Take 1 tablet by mouth at bedtime. 07/29/20   Little Ishikawa, MD  ?promethazine (PHENERGAN) 25 MG tablet Take 25 mg by mouth every 6 (six) hours as needed for nausea or vomiting.    [provider]  ?sevelamer carbonate (RENVELA) 800 MG tablet Take by mouth. 01/18/21   [provider]  ?Vitamin D, Ergocalciferol, (DRISDOL) 1.25 MG (50000 UNIT) CAPS capsule Take 50,000 Units by mouth every Saturday. 07/14/19   [provider]  ?   ? ?Allergies    ?Lactose intolerance (gi)   ? ?Review of Systems   ?Review of Systems ? ?Physical Exam ?Updated Vital Signs ?BP (!) 161/75   Pulse 94   Temp (!) 103.4 ?F (39.7 ?C)   Resp (!) 29   SpO2 99%  ?Physical Exam ?Vitals and nursing note reviewed.  ?Constitutional:   ?   General: He is in acute distress.  ?   Appearance: He is well-developed. He is not ill-appearing.  ?   Comments: Patient is sleepy but easily arousable and answers questions appropriately, keeps telling us leave him alone  and he does not want to be touched.  ?HENT:  ?   Head: Normocephalic and atraumatic.  ?   Nose: Nose normal.  ?Eyes:  ?   Extraocular Movements: Extraocular movements intact.  ?   Conjunctiva/sclera: Conjunctivae normal.  ?   Pupils: Pupils are equal, round, and reactive to light.  ?Cardiovascular:  ?   Rate and Rhythm: Normal rate and regular rhythm.  ?   Pulses: Normal pulses.  ?   Heart sounds: Normal heart sounds. No murmur heard. ?Pulmonary:  ?   Effort: Pulmonary effort is normal. No respiratory distress.  ?   Breath sounds: Normal breath sounds.  ?Abdominal:  ?   Palpations: Abdomen is soft.  ?   Tenderness: There is no abdominal tenderness.   ?Musculoskeletal:     ?   General: No swelling.  ?   Cervical back: Neck supple.  ?Skin: ?   General: Skin is warm and dry.  ?   Capillary Refill: Capillary refill takes less than 2 seconds.  ?Neurological:  ?   General: No focal deficit present.  ?   Mental Status: He is alert.  ?   Cranial Nerves: No cranial nerve deficit.  ?   Sensory: No sensory deficit.  ?   Motor: No weakness.  ?Psychiatric:     ?   Mood and Affect: Mood normal.  ? ? ?ED Results / Procedures / Treatments   ?Labs ?(all labs ordered are listed, but only abnormal results are displayed) ?Labs Reviewed  ?LACTIC ACID, PLASMA - Abnormal; Notable for the following components:  ?    Result Value  ? Lactic Acid, Venous 3.2 (*)   ? All other components within normal limits  ?COMPREHENSIVE METABOLIC PANEL - Abnormal; Notable for the following components:  ? Chloride 97 (*)   ? Glucose, Bld 128 (*)   ? BUN 25 (*)   ? Creatinine, Ser 5.91 (*)   ? Calcium 8.8 (*)   ? Total Protein 8.3 (*)   ? GFR, Estimated 11 (*)   ? All other components within normal limits  ?CBC WITH DIFFERENTIAL/PLATELET - Abnormal; Notable for the following components:  ? RBC 3.08 (*)   ? Hemoglobin 9.6 (*)   ? HCT 30.7 (*)   ? RDW 16.0 (*)   ? Platelets 113 (*)   ? All other components within normal limits  ?BLOOD GAS, VENOUS - Abnormal; Notable for the following components:  ? pO2, Ven 31 (*)   ? Bicarbonate 34.9 (*)   ? Acid-Base Excess 7.7 (*)   ? All other components within normal limits  ?PROTIME-INR - Abnormal; Notable for the following components:  ? Prothrombin Time 17.7 (*)   ? INR 1.5 (*)   ? All other components within normal limits  ?CBG MONITORING, ED - Abnormal; Notable for the following components:  ? Glucose-Capillary 129 (*)   ? All other components within normal limits  ?RESP PANEL BY RT-PCR (FLU A&B, COVID) [id number]  ?CULTURE, BLOOD (ROUTINE X 2)  ?CULTURE, BLOOD (ROUTINE X 2)  ?URINE CULTURE  ?C DIFFICILE QUICK SCREEN W PCR REFLEX    ?GASTROINTESTINAL PANEL BY PCR,  STOOL (REPLACES STOOL CULTURE)  ?APTT  ?LIPASE, BLOOD  ?LACTIC ACID, PLASMA  ?URINALYSIS, ROUTINE W REFLEX MICROSCOPIC  ? ? ?EKG ?EKG Interpretation ? ?Date/Time:  Monday May 08 2021 17:25:18 EDT ?Ventricular Rate:  90 ?PR Interval:  174 ?QRS Duration: 91 ?QT Interval:  366 ?QTC Calculation: 448 ?R Axis:   41 ?Text  Interpretation: Sinus rhythm Confirmed by Lennice Sites [phone]) on 05/08/2021 6:26:11 PM ? ?Radiology ?DG Chest Port 1 View ? ?Result Date: 05/08/2021 ?CLINICAL DATA:  Sepsis EXAM: PORTABLE CHEST 1 VIEW COMPARISON:  08/02/2020 FINDINGS: The heart size and mediastinal contours are within normal limits. Both lungs are clear. The visualized skeletal structures are unremarkable. IMPRESSION: No active disease. Electronically Signed   By: Ulyses Jarred M.D.   On: 05/08/2021 20:20   ? ?Procedures ?Procedures  ? ? ?Medications Ordered in ED ?Medications  ?lactated ringers infusion (has no administration in time range)  ?cefTRIAXone (ROCEPHIN) 2 g in sodium chloride 0.9 % 100 mL IVPB (0 g Intravenous Stopped 05/08/21 1857)  ?azithromycin (ZITHROMAX) 500 mg in sodium chloride 0.9 % 250 mL IVPB (500 mg Intravenous New Bag/Given 05/08/21 1857)  ?acetaminophen (TYLENOL) 325 MG suppository (325 mg  Given 05/08/21 1730)  ?acetaminophen (TYLENOL) suppository 650 mg (650 mg Rectal Given 05/08/21 1826)  ?fentaNYL (SUBLIMAZE) injection 100 mcg (100 mcg Intramuscular Given 05/08/21 1730)  ?sodium chloride 0.9 % bolus 1,000 mL (1,000 mLs Intravenous New Bag/Given 05/08/21 2008)  ? ? ?ED Course/ Medical Decision Making/ A&P ?  ?                        ?Medical Decision Making ?Amount and/or Complexity of Data Reviewed ?Labs: ordered. ?Radiology: ordered. ?ECG/medicine tests: ordered. ? ?Risk ?OTC drugs. ?Prescription drug management. ?Decision regarding hospitalization. ? ? ?Sharbel TRESHAUN CARRICO is a 51 year old male with history of end-stage renal disease on hemodialysis, diabetes who presents to the ED from dialysis with excessive sleepiness.  He  arrives here with a fever but otherwise normal vitals.  Patient is easily arousable and answers questions appropriately.  He is very upset with staff about trying to check his vitals and start an IV.  He state

## 2021-05-08 NOTE — Assessment & Plan Note (Signed)
Hypersomnolent secondary to COVID-19 viral infection.  Continue management as above and hold sedating medications until mental status improves. ?

## 2021-05-08 NOTE — Assessment & Plan Note (Signed)
Continue atorvastatin

## 2021-05-08 NOTE — Sepsis Progress Note (Signed)
eLink monitoring code sepsis.  

## 2021-05-08 NOTE — Assessment & Plan Note (Signed)
Continue Plavix and atorvastatin. 

## 2021-05-08 NOTE — ED Triage Notes (Addendum)
Per EMS, patient from [facility], staff reports increased drowsiness, falling asleep during sentence, but can be awoken verbally. Had dialysis today. Patient denies pain. A&Ox4 when awoken. Bilateral BKA. ?

## 2021-05-08 NOTE — Assessment & Plan Note (Signed)
Presenting with encephalopathy/hypersomnolence and fever up to 103.4 ?F.  SARS-CoV-2 PCR is positive.  CXR negative for pneumonia or inflammatory process.  SPO2 dropping to mid 80s while sleeping. ?-Start on molnupiravir ?-Continue supplemental oxygen as needed and wean as able ?-Hold further antibiotics ?

## 2021-05-08 NOTE — Assessment & Plan Note (Signed)
Place on very sensitive SSI. 

## 2021-05-08 NOTE — Assessment & Plan Note (Signed)
Completed usual MWF dialysis prior to admission on 5/1.  No emergent need for dialysis at this time. ?-Admit to [facility] ?-Will need nephrology consult for routine dialysis if remains in hospital on Wednesday ?

## 2021-05-08 NOTE — ED Notes (Signed)
MD at bedside request for PT to be place on oxygen due to low stat.(Room air 80-84%) RN made aware ?

## 2021-05-08 NOTE — Assessment & Plan Note (Signed)
S/p b/l BKA. Continue Plavix and atorvastatin. ?

## 2021-05-08 NOTE — Hospital Course (Signed)
Jorge [name] is a 51 y.o. male with medical history significant for ESRD on MWF HD, PAD s/p b/l BKA, recurrent stroke on Plavix, insulin-dependent diabetes, HTN, anemia of chronic disease, anxiety who is admitted with encephalopathy due to COVID-19 viral infection. ?

## 2021-05-08 NOTE — Sepsis Progress Note (Signed)
Notified bedside nurse of need to draw repeat lactic acid and obtain a recent blood pressure.  ?

## 2021-05-08 NOTE — ED Notes (Signed)
Call received from pt sister Langley Gauss YOYOO@ [phone] requesting rtn call for pt status/updates.ENMiles ?

## 2021-05-08 NOTE — ED Provider Triage Note (Signed)
Emergency Medicine Provider Triage Evaluation Note ? ?Jorge [name] , a 51 y.o. male  was evaluated in triage.  Pt complains of excessive sleepiness. Dialysis pt, falling asleep at dialysis, no pain ? ?Review of Systems  ?Positive: sleepy ?Negative: pain ? ?Physical Exam  ?BP (!) 161/75   Pulse 91   Temp 99.4 ?F (37.4 ?C) (Oral)   Resp 16   SpO2 92%  ?Gen:   Drowsy, sleepy but arousable ?Resp:  Normal effort  ?MSK:   Moves extremities without difficulty  ?Other:   ? ?Medical Decision Making  ?Medically screening exam initiated at 4:36 PM.  Appropriate orders placed.  Jorge [name] was informed that the remainder of the evaluation will be completed by another provider, this initial triage assessment does not replace that evaluation, and the importance of remaining in the ED until their evaluation is complete. ? ? ?  ?Domenic Moras, PA-C ?05/08/21 1639 ? ?
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
## 2021-05-08 NOTE — Assessment & Plan Note (Signed)
Hemoglobin relatively stable without obvious bleeding.  Continue to monitor. ?

## 2021-05-09 ENCOUNTER — Encounter (HOSPITAL_COMMUNITY): Payer: Self-pay | Admitting: Internal Medicine

## 2021-05-09 DIAGNOSIS — M2550 Pain in unspecified joint: Secondary | ICD-10-CM | POA: Diagnosis not present

## 2021-05-09 DIAGNOSIS — Z8673 Personal history of transient ischemic attack (TIA), and cerebral infarction without residual deficits: Secondary | ICD-10-CM | POA: Diagnosis not present

## 2021-05-09 DIAGNOSIS — G9340 Encephalopathy, unspecified: Secondary | ICD-10-CM | POA: Diagnosis not present

## 2021-05-09 DIAGNOSIS — E11649 Type 2 diabetes mellitus with hypoglycemia without coma: Secondary | ICD-10-CM | POA: Diagnosis not present

## 2021-05-09 DIAGNOSIS — N250 Renal osteodystrophy: Secondary | ICD-10-CM | POA: Diagnosis not present

## 2021-05-09 DIAGNOSIS — E1151 Type 2 diabetes mellitus with diabetic peripheral angiopathy without gangrene: Secondary | ICD-10-CM | POA: Diagnosis not present

## 2021-05-09 DIAGNOSIS — N186 End stage renal disease: Secondary | ICD-10-CM | POA: Diagnosis not present

## 2021-05-09 DIAGNOSIS — G9341 Metabolic encephalopathy: Secondary | ICD-10-CM | POA: Diagnosis not present

## 2021-05-09 DIAGNOSIS — Z8249 Family history of ischemic heart disease and other diseases of the circulatory system: Secondary | ICD-10-CM | POA: Diagnosis not present

## 2021-05-09 DIAGNOSIS — Z992 Dependence on renal dialysis: Secondary | ICD-10-CM | POA: Diagnosis not present

## 2021-05-09 DIAGNOSIS — I120 Hypertensive chronic kidney disease with stage 5 chronic kidney disease or end stage renal disease: Secondary | ICD-10-CM | POA: Diagnosis not present

## 2021-05-09 DIAGNOSIS — Z7902 Long term (current) use of antithrombotics/antiplatelets: Secondary | ICD-10-CM | POA: Diagnosis not present

## 2021-05-09 DIAGNOSIS — Z833 Family history of diabetes mellitus: Secondary | ICD-10-CM | POA: Diagnosis not present

## 2021-05-09 DIAGNOSIS — Z794 Long term (current) use of insulin: Secondary | ICD-10-CM | POA: Diagnosis not present

## 2021-05-09 DIAGNOSIS — J9601 Acute respiratory failure with hypoxia: Secondary | ICD-10-CM | POA: Diagnosis not present

## 2021-05-09 DIAGNOSIS — Z89512 Acquired absence of left leg below knee: Secondary | ICD-10-CM | POA: Diagnosis not present

## 2021-05-09 DIAGNOSIS — E1169 Type 2 diabetes mellitus with other specified complication: Secondary | ICD-10-CM | POA: Diagnosis not present

## 2021-05-09 DIAGNOSIS — I739 Peripheral vascular disease, unspecified: Secondary | ICD-10-CM | POA: Diagnosis not present

## 2021-05-09 DIAGNOSIS — E1129 Type 2 diabetes mellitus with other diabetic kidney complication: Secondary | ICD-10-CM | POA: Diagnosis not present

## 2021-05-09 DIAGNOSIS — D696 Thrombocytopenia, unspecified: Secondary | ICD-10-CM | POA: Diagnosis not present

## 2021-05-09 DIAGNOSIS — E1122 Type 2 diabetes mellitus with diabetic chronic kidney disease: Secondary | ICD-10-CM | POA: Diagnosis not present

## 2021-05-09 DIAGNOSIS — Z87891 Personal history of nicotine dependence: Secondary | ICD-10-CM | POA: Diagnosis not present

## 2021-05-09 DIAGNOSIS — Z79899 Other long term (current) drug therapy: Secondary | ICD-10-CM | POA: Diagnosis not present

## 2021-05-09 DIAGNOSIS — Z89511 Acquired absence of right leg below knee: Secondary | ICD-10-CM | POA: Diagnosis not present

## 2021-05-09 DIAGNOSIS — E785 Hyperlipidemia, unspecified: Secondary | ICD-10-CM | POA: Diagnosis not present

## 2021-05-09 DIAGNOSIS — Z823 Family history of stroke: Secondary | ICD-10-CM | POA: Diagnosis not present

## 2021-05-09 DIAGNOSIS — R509 Fever, unspecified: Secondary | ICD-10-CM | POA: Diagnosis not present

## 2021-05-09 DIAGNOSIS — U071 COVID-19: Secondary | ICD-10-CM | POA: Diagnosis not present

## 2021-05-09 DIAGNOSIS — R531 Weakness: Secondary | ICD-10-CM | POA: Diagnosis not present

## 2021-05-09 DIAGNOSIS — E739 Lactose intolerance, unspecified: Secondary | ICD-10-CM | POA: Diagnosis not present

## 2021-05-09 DIAGNOSIS — Z7401 Bed confinement status: Secondary | ICD-10-CM | POA: Diagnosis not present

## 2021-05-09 DIAGNOSIS — D631 Anemia in chronic kidney disease: Secondary | ICD-10-CM | POA: Diagnosis not present

## 2021-05-09 LAB — CBC WITH DIFFERENTIAL/PLATELET
Abs Immature Granulocytes: 0.03 10*3/uL (ref 0.00–0.07)
Basophils Absolute: 0 10*3/uL (ref 0.0–0.1)
Basophils Relative: 1 %
Eosinophils Absolute: 0.7 10*3/uL — ABNORMAL HIGH (ref 0.0–0.5)
Eosinophils Relative: 16 %
HCT: 29.6 % — ABNORMAL LOW (ref 39.0–52.0)
Hemoglobin: 9.3 g/dL — ABNORMAL LOW (ref 13.0–17.0)
Immature Granulocytes: 1 %
Lymphocytes Relative: 22 %
Lymphs Abs: 1.1 10*3/uL (ref 0.7–4.0)
MCH: 30.9 pg (ref 26.0–34.0)
MCHC: 31.4 g/dL (ref 30.0–36.0)
MCV: 98.3 fL (ref 80.0–100.0)
Monocytes Absolute: 0.7 10*3/uL (ref 0.1–1.0)
Monocytes Relative: 14 %
Neutro Abs: 2.2 10*3/uL (ref 1.7–7.7)
Neutrophils Relative %: 46 %
Platelets: UNDETERMINED 10*3/uL (ref 150–400)
RBC: 3.01 MIL/uL — ABNORMAL LOW (ref 4.22–5.81)
RDW: 15.9 % — ABNORMAL HIGH (ref 11.5–15.5)
WBC: 4.7 10*3/uL (ref 4.0–10.5)
nRBC: 0 % (ref 0.0–0.2)

## 2021-05-09 LAB — BLOOD CULTURE ID PANEL (REFLEXED) - BCID2

## 2021-05-09 LAB — COMPREHENSIVE METABOLIC PANEL
ALT: 15 U/L (ref 0–44)
AST: 33 U/L (ref 15–41)
Albumin: 3.2 g/dL — ABNORMAL LOW (ref 3.5–5.0)
Alkaline Phosphatase: 101 U/L (ref 38–126)
Anion gap: 14 (ref 5–15)
BUN: 34 mg/dL — ABNORMAL HIGH (ref 6–20)
CO2: 25 mmol/L (ref 22–32)
Calcium: 8.7 mg/dL — ABNORMAL LOW (ref 8.9–10.3)
Chloride: 102 mmol/L (ref 98–111)
Creatinine, Ser: 8.13 mg/dL — ABNORMAL HIGH (ref 0.61–1.24)
GFR, Estimated: 7 mL/min — ABNORMAL LOW (ref >60)
Glucose, Bld: 129 mg/dL — ABNORMAL HIGH (ref 70–99)
Potassium: 4.7 mmol/L (ref 3.5–5.1)
Sodium: 141 mmol/L (ref 135–145)
Total Bilirubin: 0.7 mg/dL (ref 0.3–1.2)
Total Protein: 6.9 g/dL (ref 6.5–8.1)

## 2021-05-09 LAB — C DIFFICILE QUICK SCREEN W PCR REFLEX
C Diff antigen: POSITIVE — AB
C Diff toxin: NEGATIVE

## 2021-05-09 LAB — PHOSPHORUS: Phosphorus: 4.7 mg/dL — ABNORMAL HIGH (ref 2.5–4.6)

## 2021-05-09 LAB — CBG MONITORING, ED
Glucose-Capillary: 66 mg/dL — ABNORMAL LOW (ref 70–99)
Glucose-Capillary: 57 mg/dL — ABNORMAL LOW (ref 70–99)
Glucose-Capillary: 156 mg/dL — ABNORMAL HIGH (ref 70–99)

## 2021-05-09 LAB — GLUCOSE, CAPILLARY
Glucose-Capillary: 99 mg/dL (ref 70–99)
Glucose-Capillary: 122 mg/dL — ABNORMAL HIGH (ref 70–99)

## 2021-05-09 LAB — CLOSTRIDIUM DIFFICILE BY PCR, REFLEXED: Toxigenic C. Difficile by PCR: NEGATIVE

## 2021-05-09 MED ORDER — CHLORHEXIDINE GLUCONATE CLOTH 2 % EX PADS
6.0000 | MEDICATED_PAD | Freq: Every day | CUTANEOUS | Status: DC
  Administered 2021-05-11 – 2021-05-12 (×2): 6 via TOPICAL

## 2021-05-09 MED ORDER — DEXTROSE 50 % IV SOLN
1.0000 | Freq: Once | INTRAVENOUS | Status: AC
  Administered 2021-05-09: 50 mL via INTRAVENOUS
  Filled 2021-05-09: qty 50

## 2021-05-09 NOTE — Progress Notes (Signed)
PHARMACY - PHYSICIAN COMMUNICATION ?CRITICAL VALUE ALERT - BLOOD CULTURE IDENTIFICATION (BCID) ? ?Jorge [name] is an 51 y.o. male who presented to [facility] on 05/08/2021 with a chief complaint of somnolence ? ?Assessment:  Pt was admitted for somnolence after his HD session. He was found to have COVID so molnupiravir was ordered. Micro called with BCID result of 2/4 bottles (1 bottle from each set) with staph species w/o resistance genes. It's very likely to be be a contaminant because it's 1 bottle from each set at the same site. He did get one dose of ceftriaxone so it would provide some activity. D/w Dr Bridgett Larsson and we will hold off further abx and repeat blood cultures from different sites.  ? ?Name of physician (or Provider) Contacted: Dr Bridgett Larsson ? ?Current antibiotics: Ceftiaxone x1 ? ?Changes to prescribed antibiotics recommended:  ?Hold off abx and repeat cultures ? ?Results for orders placed or performed during the hospital encounter of 05/08/21  ?Blood Culture ID Panel (Reflexed) (Collected: 05/08/2021  6:13 PM)  ?Result Value Ref Range  ? Enterococcus faecalis NOT DETECTED NOT DETECTED  ? Enterococcus Faecium NOT DETECTED NOT DETECTED  ? Listeria monocytogenes NOT DETECTED NOT DETECTED  ? Staphylococcus species DETECTED (A) NOT DETECTED  ? Staphylococcus aureus (BCID) NOT DETECTED NOT DETECTED  ? Staphylococcus epidermidis NOT DETECTED NOT DETECTED  ? Staphylococcus lugdunensis NOT DETECTED NOT DETECTED  ? Streptococcus species NOT DETECTED NOT DETECTED  ? Streptococcus agalactiae NOT DETECTED NOT DETECTED  ? Streptococcus pneumoniae NOT DETECTED NOT DETECTED  ? Streptococcus pyogenes NOT DETECTED NOT DETECTED  ? A.calcoaceticus-baumannii NOT DETECTED NOT DETECTED  ? Bacteroides fragilis NOT DETECTED NOT DETECTED  ? Enterobacterales NOT DETECTED NOT DETECTED  ? Enterobacter cloacae complex NOT DETECTED NOT DETECTED  ? Escherichia coli NOT DETECTED NOT DETECTED  ? Klebsiella aerogenes NOT DETECTED NOT DETECTED   ? Klebsiella oxytoca NOT DETECTED NOT DETECTED  ? Klebsiella pneumoniae NOT DETECTED NOT DETECTED  ? Proteus species NOT DETECTED NOT DETECTED  ? Salmonella species NOT DETECTED NOT DETECTED  ? Serratia marcescens NOT DETECTED NOT DETECTED  ? Haemophilus influenzae NOT DETECTED NOT DETECTED  ? Neisseria meningitidis NOT DETECTED NOT DETECTED  ? Pseudomonas aeruginosa NOT DETECTED NOT DETECTED  ? Stenotrophomonas maltophilia NOT DETECTED NOT DETECTED  ? Candida albicans NOT DETECTED NOT DETECTED  ? Candida auris NOT DETECTED NOT DETECTED  ? Candida glabrata NOT DETECTED NOT DETECTED  ? Candida krusei NOT DETECTED NOT DETECTED  ? Candida parapsilosis NOT DETECTED NOT DETECTED  ? Candida tropicalis NOT DETECTED NOT DETECTED  ? Cryptococcus neoformans/gattii NOT DETECTED NOT DETECTED  ? ? ?Onnie Boer, PharmD, BCIDP, AAHIVP, CPP ?Infectious Disease Pharmacist ?05/09/2021 8:02 PM ? ? ? ?

## 2021-05-09 NOTE — ED Notes (Signed)
[facility] called at this time for transport  ?

## 2021-05-09 NOTE — Plan of Care (Signed)
  Problem: Education: Goal: Knowledge of General Education information will improve Description Including pain rating scale, medication(s)/side effects and non-pharmacologic comfort measures Outcome: Progressing   

## 2021-05-09 NOTE — ED Notes (Signed)
[facility] at bedside 

## 2021-05-09 NOTE — Progress Notes (Signed)
?PROGRESS NOTE ? ?Jorge [name]  ?DOB: 1970/09/23  ?PCP: No primary care provider on file. ?AYT:[id number]  ?DOA: 05/08/2021 ? LOS: 0 days  ?Hospital Day: 2 ? ?Brief narrative: ?Jorge [name] is a 51 y.o. male with PMH significant for ESRD-HD-MWF, DM2, HTN, HLD, PAD s/p b/l BKA, recurrent stroke on Plavix, chronic anemia, anxiety, history of C. difficile diarrhea, diabetic gastroparesis ?Patient was brought to the ED by EMS on 5/[name] for excessive somnolence after the last dialysis on the day of presentation Monday 5/1. ? ?In the ED, patient was sleepy but would wake up on calling his name, oriented x4. ?Initial vital signs were stable but while in the ED, he mounted a fever 103.4 ?Labs showed WC count normal at 7.1, hemoglobin low at 9.6, platelet 113, sodium 140, potassium 4.6, LFTs normal, lactic acid elevated to 3.2. ?Chest x-ray did not show any focal consolidation, edema or effusion. ? ?Patient was started on IV fluid, IV antibiotics and admitted to hospitalist service ?COVID PCR resulted positive later. ? ?Subjective: ?Patient was seen and examined this morning.  Pleasant middle-aged African-American male.  Propped up in bed.  Seems more awake and conversational to me compared to how he was described in the chart last night. ?Chart reviewed.  No recurrence of fever after initial episode of 103.4, hemodynamically remains stable ? ?Principal Problem: ?  Febrile illness ?Active Problems: ?  COVID-19 virus infection ?  Acute encephalopathy ?  ESRD (end stage renal disease) on dialysis [facility]) ?  Type 2 diabetes mellitus with chronic kidney disease on chronic dialysis, with long-term current use of insulin ([place]) ?  Anemia of chronic kidney failure ?  History of CVA (cerebrovascular accident) ?  Hyperlipidemia associated with type 2 diabetes mellitus ([place]) ?  PAD (peripheral artery disease) ([place]) ?  Thrombocytopenia ([place]) ?  ? ?Assessment and Plan: ?COVID-19 virus infection ?Acute respiratory  failure with hypoxia ?-Presented with excessive somnolence, fever of 103.4 in the ED  ?-COVID PCR positive  ?-Chest x-ray negative for pneumonia  ?-Oxygen saturation dropped down to 80s on sleeping.  Currently on supplemental oxygen ?-He has been started on molnupiravir ?-Antibiotics on hold. ?Recent Labs  ?Lab 05/08/21 ?1800 05/08/21 ?2203  ?[place]*  --   ?WBC 7.1  --   ?LATICACIDVEN 3.2* 0.9  ?ALT 12  --   ? ? ?Acute encephalopathy ?-Hypersomnolence likely because of COVID-19 infection.   ?-Serum bicarb level 30 and is stable.  No history of hypercapnia in the past.  No history of liver cirrhosis but I will check an ammonia level in a.m. just to complete the work-up.  ?-PTA, patient was not Neurontin 300 mg 3 times daily, Ativan 0.5 mg daily before dialysis, Phenergan as needed.  Keep all of these on hold for now. ?-Apparently patient did not get CT head on admission.  At the time of my evaluation, his mental status seems to be improving already.  I would hold off on imaging at this time. ?  ?ESRD-HD-MWF ?-Last dialysis on the day of admission 5/1.  No emergent need at this time.   ?-Nephrology alerted. ? ?Type 2 diabetes mellitus ?-A1c 7.2 in February 2023 ?-PTA on Lantus 5 units at bedtime, Humalog sliding scale insulin. ?-Currently on sliding scale insulin with Accu-Cheks ?Recent Labs  ?Lab 05/08/21 ?1727 05/09/21 ?1110 05/09/21 ?1136  ?GLUCAP 129* 66* 57*  ? ?Hypertension ?-PTA, patient was on Lasix 20 mg twice daily ?-Currently on hold. ? ?Thrombocytopenia ([place]) ?-New thrombocytopenia with  platelets down to 113,000.  Likely secondary to acute COVID-19 viral illness.  Continue to monitor. ?  ?PAD s/p b/l BKA ?History of recurrent stroke ?Hyperlipidemia ?-Continue Plavix and atorvastatin. ?    ?Anemia chronic kidney disease ?Hemoglobin relatively stable without obvious bleeding.  Continue to monitor. ?Recent Labs  ?  07/13/20 ?1946 07/13/20 ?2148 07/27/20 ?0429 07/28/20 ?3474 07/29/20 ?0706  08/02/20 ?2102 05/08/21 ?1800  ?HGB  --    < > 10.7* 10.8* 9.4* 12.0* 9.6*  ?MCV  --    < > 97.5 97.1 98.1 100.8* 99.7  ?[id number] 482  --   --   --   --   --   --   ?FOLATE 12.6  --   --   --   --   --   --   ? < > = values in this interval not displayed.  ?  ?Goals of care ?  Code Status: Full Code  ? ? ?Mobility: Bilateral BKA status ? ?Skin assessment:  ?  ? ?Nutritional status:  ?There is no height or weight on file to calculate BMI.  ?  ?  ? ? ? ? ?Diet:  ?Diet Order   ? ?       ?  Diet renal/carb modified with fluid restriction Diet-HS Snack? Nothing; Fluid restriction: 1200 mL Fluid; Room service appropriate? Yes; Fluid consistency: Thin  Diet effective now       ?  ? ?  ?  ? ?  ? ? ?DVT prophylaxis:  ?heparin injection 5,000 Units Start: 05/08/21 2245 ?  ?Antimicrobials: Molnupiravir ?Fluid: None ?Consultants: Nephrology ?Family Communication: None at bedside ? ?Status is: Observation ? ?Continue in-hospital care because: Needs treatment for COVID ?Level of care: Med-Surg  ? ?[name]: The patient is from: Nursing facility ?             Anticipated d/c is to: Back to nursing facility in 2 to 3 days ?             Patient currently is not medically stable to d/c. ?  Difficult to place patient No ? ? ? ? ?Infusions:  ? ? ?Scheduled Meds: ? atorvastatin  40 mg Oral Daily  ? clopidogrel  75 mg Oral Daily  ? heparin  5,000 Units Subcutaneous Q8H  ? insulin aspart  0-6 Units Subcutaneous TID WC  ? molnupiravir EUA  4 capsule Oral BID  ? sevelamer carbonate  1,600 mg Oral 2 times per day  ? sevelamer carbonate  3,200 mg Oral 3 times per day  ? sodium chloride  1-2 spray Each Nare Q2H  ? ? ?PRN meds: ?acetaminophen, Ipratropium-Albuterol, ondansetron **OR** ondansetron (ZOFRAN) IV  ? ?Antimicrobials: ?Anti-infectives (From admission, onward)  ? ? Start     Dose/Rate Route Frequency Ordered Stop  ? 05/08/21 2245  molnupiravir EUA (LAGEVRIO) capsule 800 mg       ? 4 capsule Oral 2 times daily 05/08/21 2231 05/13/21 2159   ? 05/08/21 1745  cefTRIAXone (ROCEPHIN) 2 g in sodium chloride 0.9 % 100 mL IVPB  Status:  Discontinued       ? 2 g ?200 mL/hr over 30 Minutes Intravenous Every 24 hours 05/08/21 1735 05/08/21 2249  ? 05/08/21 1745  azithromycin (ZITHROMAX) 500 mg in sodium chloride 0.9 % 250 mL IVPB  Status:  Discontinued       ? 500 mg ?250 mL/hr over 60 Minutes Intravenous Every 24 hours 05/08/21 1735 05/08/21 2249  ? ?  ? ? ?  Objective: ?Vitals:  ? 05/09/21 1030 05/09/21 1100  ?BP: (!) 152/99 (!) 141/85  ?Pulse: 76 71  ?Resp: 17   ?Temp:    ?SpO2: 100% 100%  ? ?No intake or output data in the 24 hours ending 05/09/21 1232 ?There were no vitals filed for this visit. ?Weight change:  ?There is no height or weight on file to calculate BMI.  ? ?Physical Exam: ?General exam: Pleasant, ?Skin: No rashes, lesions or ulcers. ?HEENT: Atraumatic, normocephalic, no obvious bleeding ?Lungs: Clear to auscultation bilaterally ?CVS: Regular rate and rhythm, no murmur ?GI/Abd soft, nontender, nondistended, bowel sound present ?CNS: Alert, awake, oriented to place and person ?Psychiatry: Mood appropriate ?Extremities: Bilateral prior BKA status ? ?Data Review: I have personally reviewed the laboratory data and studies available. ? ?F/u labs ordered ?Unresulted Labs (From admission, onward)  ? ?  Start     Ordered  ? 05/09/21 0500  CBC with Differential/Platelet  Tomorrow morning,   R       ? 05/08/21 2242  ? 05/09/21 0500  Comprehensive metabolic panel  Tomorrow morning,   R       ? 05/08/21 2242  ? 05/08/21 2033  C Difficile Quick Screen w PCR reflex  (C Difficile quick screen w PCR reflex panel )  Once, for 24 hours,   URGENT       ?References:    CDiff Information Tool  ? 05/08/21 2032  ? 05/08/21 2033  Gastrointestinal Panel by PCR , Stool  (Gastrointestinal Panel by PCR, Stool                                                                                                                                                     **Does Not include  CLOSTRIDIUM DIFFICILE testing. **If CDIFF testing is needed, place order from the "C Difficile Testing" order set.**)  Once,   URGENT       ? 05/08/21 2032  ? 05/08/21 1735  Blood Culture (routine x 2)  (Septic
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
## 2021-05-09 NOTE — Progress Notes (Signed)
New Admission Note:  ? ?Arrival Method: stretcher  ?Mental Orientation: Alert and oriented  ?Telemetry: box 8  ?Assessment: Completed ?Skin: abrasion on right knee  ?IV: left ac  ?Pain: 0/10  ?Tubes: nasal canual  ?Safety Measures: Safety Fall Prevention Plan has been given, discussed and signed ?Admission: Completed ?[facility] Orientation: Patient has been orientated to the room, unit and staff.  ?Family: none  ? ?Orders have been reviewed and implemented. Will continue to monitor the patient. Call light has been placed within reach and bed alarm has been activated.  ? ?Jaylanie Boschee RN ?[facility] ?Phone: [phone]  ?

## 2021-05-09 NOTE — ED Notes (Signed)
Pt CBG obtained with low reading, juice and sandwich provided at this time.  ?

## 2021-05-10 DIAGNOSIS — U071 COVID-19: Secondary | ICD-10-CM | POA: Diagnosis not present

## 2021-05-10 DIAGNOSIS — G9340 Encephalopathy, unspecified: Secondary | ICD-10-CM

## 2021-05-10 DIAGNOSIS — N186 End stage renal disease: Secondary | ICD-10-CM | POA: Diagnosis not present

## 2021-05-10 DIAGNOSIS — Z794 Long term (current) use of insulin: Secondary | ICD-10-CM

## 2021-05-10 DIAGNOSIS — D696 Thrombocytopenia, unspecified: Secondary | ICD-10-CM

## 2021-05-10 DIAGNOSIS — Z8673 Personal history of transient ischemic attack (TIA), and cerebral infarction without residual deficits: Secondary | ICD-10-CM

## 2021-05-10 DIAGNOSIS — E1122 Type 2 diabetes mellitus with diabetic chronic kidney disease: Secondary | ICD-10-CM

## 2021-05-10 DIAGNOSIS — I739 Peripheral vascular disease, unspecified: Secondary | ICD-10-CM

## 2021-05-10 DIAGNOSIS — Z992 Dependence on renal dialysis: Secondary | ICD-10-CM

## 2021-05-10 LAB — HEPATITIS B SURFACE ANTIBODY,QUALITATIVE: Hep B S Ab: REACTIVE — AB

## 2021-05-10 LAB — GLUCOSE, CAPILLARY
Glucose-Capillary: 88 mg/dL (ref 70–99)
Glucose-Capillary: 73 mg/dL (ref 70–99)
Glucose-Capillary: 125 mg/dL — ABNORMAL HIGH (ref 70–99)
Glucose-Capillary: 149 mg/dL — ABNORMAL HIGH (ref 70–99)

## 2021-05-10 LAB — GASTROINTESTINAL PANEL BY PCR, STOOL (REPLACES STOOL CULTURE)

## 2021-05-10 LAB — RENAL FUNCTION PANEL
Albumin: 3.1 g/dL — ABNORMAL LOW (ref 3.5–5.0)
Anion gap: 15 (ref 5–15)
BUN: 41 mg/dL — ABNORMAL HIGH (ref 6–20)
CO2: 24 mmol/L (ref 22–32)
Calcium: 8.5 mg/dL — ABNORMAL LOW (ref 8.9–10.3)
Chloride: 98 mmol/L (ref 98–111)
Creatinine, Ser: 9.34 mg/dL — ABNORMAL HIGH (ref 0.61–1.24)
GFR, Estimated: 6 mL/min — ABNORMAL LOW (ref >60)
Glucose, Bld: 78 mg/dL (ref 70–99)
Phosphorus: 4.2 mg/dL (ref 2.5–4.6)
Potassium: 3.9 mmol/L (ref 3.5–5.1)
Sodium: 137 mmol/L (ref 135–145)

## 2021-05-10 LAB — CBC
HCT: 27.9 % — ABNORMAL LOW (ref 39.0–52.0)
Hemoglobin: 8.6 g/dL — ABNORMAL LOW (ref 13.0–17.0)
MCH: 30.6 pg (ref 26.0–34.0)
MCHC: 30.8 g/dL (ref 30.0–36.0)
MCV: 99.3 fL (ref 80.0–100.0)
Platelets: 96 10*3/uL — ABNORMAL LOW (ref 150–400)
RBC: 2.81 MIL/uL — ABNORMAL LOW (ref 4.22–5.81)
RDW: 15.9 % — ABNORMAL HIGH (ref 11.5–15.5)
WBC: 4.2 10*3/uL (ref 4.0–10.5)
nRBC: 0 % (ref 0.0–0.2)

## 2021-05-10 LAB — HEPATITIS B SURFACE ANTIGEN: Hepatitis B Surface Ag: NONREACTIVE

## 2021-05-10 MED ORDER — FOLIC ACID 1 MG PO TABS
1.0000 mg | ORAL_TABLET | Freq: Every day | ORAL | Status: DC
  Administered 2021-05-11 – 2021-05-13 (×3): 1 mg via ORAL
  Filled 2021-05-10 (×3): qty 1

## 2021-05-10 NOTE — Progress Notes (Signed)
[facility] ?Progress Note ? ?Subjective: seen on HD, no c/o's.  ? ?Vitals:  ? 05/10/21 0830 05/10/21 0900 05/10/21 0930 05/10/21 1000  ?BP: (!) 154/76 137/74 (!) 123/57 126/70  ?Pulse: 71 74 73 75  ?Resp:      ?Temp:      ?TempSrc:      ?SpO2:      ?Weight:      ?Height:      ? ? ?Exam: ?Gen alert, no distress, on RA ?Sclera anicteric, throat clear  ?No jvd or bruits ?Chest clear bilat to bases ?RRR no RG ?Abd soft ntnd no mass or ascites +bs ?MS bilat BKA ?Ext no LE edema ?Neuro is alert, Ox 3 , nf ?   RUA AVF +bruit ?  ?  ?  ?  ? Home meds include - plavix, lipitor, renvela 4 ac tid ?  ?   CXR 5/01 - no active disease ?  ? [facility] HD[facility] ? 4h  2/2.5 bath  400/1.5  72.5kg  P2  Hep none  RUA AVF ? - mircera 75 q4, last 4/24 ? - venofer 50 q wk on friday ? - hectorol 4 ug tiw IV ? - last HD 5/01 82.3 > 79.7kg ? - last Hb 9.8 on 4/26 ?  ?  ?  ?Assessment/ Plan: ?COVID 19 infection - w/ high fevers and hypoxemia on presentation. Started on molnupiravir per pmd.    ?Acute hypoxic resp failure - per pmd. CXR neg, no signs of vol overload. Today is on room air. ?AMS - appears to be improving ?ESRD - on HD MWF. HD today on schedule. Has not missed any HD.  ?Volume- BP's good, up 3 kg, UFG same today ?DM2 - on insulin ?PAD sp bilat BKA ?H/o CVA ?Anemia esrd - Hb 9.6 here, esa given 4/24 at OP HD. Follow.  ?MBD ckd - CCa and phos in range. Cont binder, vdra. ?  ? ?Rob Dondra Rhett ?05/10/2021, 10:14 AM ? ? ?Recent Labs  ?Lab 05/09/21 ?1443 05/10/21 ?3235 05/10/21 ?0837  ?HGB 9.3*  --  8.6*  ?ALBUMIN 3.2* 3.1*  --   ?CALCIUM 8.7* 8.5*  --   ?PHOS 4.7* 4.2  --   ?CREATININE 8.13* 9.34*  --   ?K 4.7 3.9  --   ? ?Inpatient medications: ? atorvastatin  40 mg Oral Daily  ? Chlorhexidine Gluconate Cloth  6 each Topical Q0600  ? clopidogrel  75 mg Oral Daily  ? heparin  5,000 Units Subcutaneous Q8H  ? insulin aspart  0-6 Units Subcutaneous TID WC  ? molnupiravir EUA  4 capsule Oral BID  ? sevelamer carbonate  1,600 mg Oral  2 times per day  ? sevelamer carbonate  3,200 mg Oral 3 times per day  ? sodium chloride  1-2 spray Each Nare Q2H  ? ? ?acetaminophen, Ipratropium-Albuterol, ondansetron **OR** ondansetron (ZOFRAN) IV ? ? ? ? ? ? ?

## 2021-05-10 NOTE — Progress Notes (Addendum)
?PROGRESS NOTE ? ? ? ?Jorge [name]  PPI:[id number] DOB: 1970/04/25 DOA: 05/08/2021 ?PCP: No primary care provider on file.  ? ?Brief Narrative:  ?51 y.o. male with PMH significant for ESRD on HD, DM2, HTN, HLD, PAD s/p b/l BKA, recurrent stroke on Plavix, chronic anemia, anxiety, history of C. difficile diarrhea, diabetic gastroparesis presented with increased somnolence after dialysis.  On presentation, patient was sleepy but was febrile to 103.4.  Chest x-ray did not show any focal consolidation, edema or effusion.  He tested positive for COVID. ? ?Assessment & Plan: ?  ?COVID-19 virus infection ?Acute respiratory failure with hypoxia ?-Presented with excessive somnolence, fever. Had intermittent tachypnea requiring supplemental oxygen for hypoxia. ?-COVID PCR positive.  Chest x-ray negative for pneumonia ?-Initially started on supplemental oxygen.  Currently is on room air. ?-Continue molnupiravir  ?COVID-19 Labs ? ?No results for input(s): DDIMER, FERRITIN, LDH, CRP in the last 72 hours. ? ?Lab Results  ?Component Value Date  ? SARSCOV2NAA POSITIVE (A) 05/08/2021  ? [place] NEGATIVE 07/29/2020  ? [place] NEGATIVE 07/13/2020  ? [place] NEGATIVE 05/09/2020  ?-Monitor inflammatory markers daily ? ?Acute metabolic encephalopathy ?-Possibly from above.  Monitor mental status.  Slowly improving. ?-Check B12, ammonia, TSH in AM.  LFTs normal.  Start folic acid empirically. ?-Fall precautions.   ?-Neurontin, Ativan and Phenergan on hold for now. ? ?ESRD on hemodialysis ?-Nephrology following.  Dialysis as per nephrology schedule ? ?Diabetes mellitus type 2 with hypoglycemia ?-Continue CBGs with SSI.  Lantus on hold ? ?Hypertension ?-Blood pressure stable.  Lasix on hold ? ?Thrombocytopenia ?-No signs of bleeding.  Monitor ? ?Anemia of chronic disease  ?-from renal failure.  Hemoglobin stable ? ?PAD status post bilateral BKA  ?History of recurrent stroke ?Hyperlipidemia ?-Continue Plavix and statin ? ? ?DVT  prophylaxis: Heparin ?Code Status: Full ?Family Communication: None at bedside ?Disposition Plan: ?Status is: Inpatient ?Remains inpatient appropriate because: Of severity of illness ? ? ? ?Consultants: Nephrology ? ?Procedures: None ? ?Antimicrobials: None ? ? ?Subjective: ?Patient seen and examined at bedside undergoing hemodialysis.  Poor historian.  Wakes up slightly, answers some questions.  No overnight fever, agitation, seizures reported. ? ?Objective: ?Vitals:  ? 05/10/21 0930 05/10/21 1000 05/10/21 1030 05/10/21 1100  ?BP: (!) 123/57 126/70 122/62 135/60  ?Pulse: 73 75 74 70  ?Resp:      ?Temp:      ?TempSrc:      ?SpO2:      ?Weight:      ?Height:      ? ?No intake or output data in the 24 hours ending 05/10/21 1106 ?Filed Weights  ? 05/09/21 1343 05/10/21 0803  ?Weight: 76 kg 75.6 kg  ? ? ?Examination: ? ?General exam: Appears calm and comfortable.  Currently on room air.  Looks chronically ill and deconditioned. ?Respiratory system: Bilateral decreased breath sounds at bases with scattered crackles ?Cardiovascular system: S1 & S2 heard, Rate controlled ?Gastrointestinal system: Abdomen is nondistended, soft and nontender. Normal bowel sounds heard. ?Extremities: bilateral BKA present ?Central nervous system: Sleepy, wakes up slightly, answers some questions but slow to respond.  No focal neurological deficits. Moving extremities ?Skin: No obvious ecchymosis/lesions  ?psychiatry: Affect is mostly flat.  No signs of agitation ? ? ?Data Reviewed: I have personally reviewed following labs and imaging studies ? ?CBC: ?Recent Labs  ?Lab 05/08/21 ?1800 05/09/21 ?1443 05/10/21 ?0837  ?WBC 7.1 4.7 4.2  ?NEUTROABS 4.6 2.2  --   ?HGB 9.6* 9.3* 8.6*  ?HCT 30.7* 29.6* 27.9*  ?  MCV 99.7 98.3 99.3  ?PLT 113* PLATELET CLUMPS NOTED ON SMEAR, UNABLE TO ESTIMATE 96*  ? ?Basic Metabolic Panel: ?Recent Labs  ?Lab 05/08/21 ?1800 05/09/21 ?1443 05/10/21 ?0836  ?NA 140 141 137  ?K 4.6 4.7 3.9  ?CL 97* 102 98  ?CO2 30 25 24    ?GLUCOSE 128* 129* 78  ?BUN 25* 34* 41*  ?CREATININE 5.91* 8.13* 9.34*  ?CALCIUM 8.8* 8.7* 8.5*  ?PHOS  --  4.7* 4.2  ? ?GFR: ?Estimated Creatinine Clearance: 9.9 mL/min (A) (by C-G formula based on SCr of 9.34 mg/dL (H)). ?Liver Function Tests: ?Recent Labs  ?Lab 05/08/21 ?1800 05/09/21 ?1443 05/10/21 ?0836  ?AST 25 33  --   ?ALT 12 15  --   ?ALKPHOS 120 101  --   ?BILITOT 1.0 0.7  --   ?PROT 8.3* 6.9  --   ?ALBUMIN 4.0 3.2* 3.1*  ? ?Recent Labs  ?Lab 05/08/21 ?1735  ?LIPASE 21  ? ?No results for input(s): AMMONIA in the last 168 hours. ?Coagulation Profile: ?Recent Labs  ?Lab 05/08/21 ?1735  ?INR 1.5*  ? ?Cardiac Enzymes: ?No results for input(s): CKTOTAL, CKMB, CKMBINDEX, TROPONINI in the last 168 hours. ?BNP (last 3 results) ?No results for input(s): PROBNP in the last 8760 hours. ?HbA1C: ?No results for input(s): HGBA1C in the last 72 hours. ?CBG: ?Recent Labs  ?Lab 05/09/21 ?1136 05/09/21 ?1236 05/09/21 ?1620 05/09/21 ?1942 05/10/21 ?0751  ?GLUCAP 57* 156* 99 122* 88  ? ?Lipid Profile: ?No results for input(s): CHOL, HDL, LDLCALC, TRIG, CHOLHDL, LDLDIRECT in the last 72 hours. ?Thyroid Function Tests: ?No results for input(s): TSH, T4TOTAL, FREET4, T3FREE, THYROIDAB in the last 72 hours. ?Anemia Panel: ?No results for input(s): VITAMINB12, FOLATE, FERRITIN, TIBC, IRON, RETICCTPCT in the last 72 hours. ?Sepsis Labs: ?Recent Labs  ?Lab 05/08/21 ?1800 05/08/21 ?2203  ?LATICACIDVEN 3.2* 0.9  ? ? ?Recent Results (from the past 240 hour(s))  ?Resp Panel by RT-PCR (Flu A&B, Covid) Nasopharyngeal Swab     Status: Abnormal  ? Collection Time: 05/08/21  6:00 PM  ? Specimen: Nasopharyngeal Swab; Nasopharyngeal(NP) swabs in vial transport medium  ?Result Value Ref Range Status  ? SARS Coronavirus 2 by RT PCR POSITIVE (A) NEGATIVE Final  ?  Comment: (NOTE) ?SARS-CoV-2 target nucleic acids are DETECTED. ? ?The SARS-CoV-2 RNA is generally detectable in upper respiratory ?specimens during the acute phase of infection.  Positive results are ?indicative of the presence of the identified virus, but do not rule ?out bacterial infection or co-infection with other pathogens not ?detected by the test. Clinical correlation with patient history and ?other diagnostic information is necessary to determine patient ?infection status. The expected result is Negative. ? ?Fact Sheet for Patients: ?EntrepreneurPulse.com.au ? ?Fact Sheet for Healthcare Providers: ?IncredibleEmployment.be ? ?This test is not yet approved or cleared by the [place] FDA and  ?has been authorized for detection and/or diagnosis of SARS-CoV-2 by ?FDA under an Emergency Use Authorization (EUA).  This EUA will ?remain in effect (meaning this test can be used) for the duration of  ?the COVID-19 declaration under Section 564(b)(1) of the A ct, 21 ?U.S.C. section 360bbb-3(b)(1), unless the authorization is ?terminated or revoked sooner. ? ?  ? Influenza A by PCR NEGATIVE NEGATIVE Final  ? Influenza B by PCR NEGATIVE NEGATIVE Final  ?  Comment: (NOTE) ?The Xpert Xpress SARS-CoV-2/FLU/RSV plus assay is intended as an aid ?in the diagnosis of influenza from Nasopharyngeal swab specimens and ?should not be used as a sole basis for treatment. Nasal  washings and ?aspirates are unacceptable for Xpert Xpress SARS-CoV-2/FLU/RSV ?testing. ? ?Fact Sheet for Patients: ?EntrepreneurPulse.com.au ? ?Fact Sheet for Healthcare Providers: ?IncredibleEmployment.be ? ?This test is not yet approved or cleared by the [place] FDA and ?has been authorized for detection and/or diagnosis of SARS-CoV-2 by ?FDA under an Emergency Use Authorization (EUA). This EUA will remain ?in effect (meaning this test can be used) for the duration of the ?COVID-19 declaration under Section 564(b)(1) of the Act, 21 U.S.C. ?section 360bbb-3(b)(1), unless the authorization is terminated or ?revoked. ? ?Performed at [facility], [place] [address] ?[place], [place] [address] ?  ?Blood Culture (routine x 2)     Status: Abnormal (Preliminary result)  ? Collection Time: 05/08/21  6:11 PM  ? Specimen: BLOOD  ?Result Value Ref Range Statu

## 2021-05-10 NOTE — Progress Notes (Signed)
Pt receives out-pt HD at [facility] on MWF. Clinic advised pt is positive for covid this admission. At d/c, pt will need to arrive at clinic at 11:30 and enter clinic from side entrance. Pt's snf will need to be made aware of this info at d/c. Discussed with TOC staff at progression this am. Will assist as needed.  ? ?Melven Sartorius ?Renal Navigator ?[phone] ?

## 2021-05-11 DIAGNOSIS — E1169 Type 2 diabetes mellitus with other specified complication: Secondary | ICD-10-CM

## 2021-05-11 DIAGNOSIS — E785 Hyperlipidemia, unspecified: Secondary | ICD-10-CM

## 2021-05-11 LAB — COMPREHENSIVE METABOLIC PANEL
ALT: 15 U/L (ref 0–44)
AST: 27 U/L (ref 15–41)
Albumin: 3.1 g/dL — ABNORMAL LOW (ref 3.5–5.0)
Alkaline Phosphatase: 104 U/L (ref 38–126)
Anion gap: 12 (ref 5–15)
BUN: 23 mg/dL — ABNORMAL HIGH (ref 6–20)
CO2: 29 mmol/L (ref 22–32)
Calcium: 8.5 mg/dL — ABNORMAL LOW (ref 8.9–10.3)
Chloride: 97 mmol/L — ABNORMAL LOW (ref 98–111)
Creatinine, Ser: 6.59 mg/dL — ABNORMAL HIGH (ref 0.61–1.24)
GFR, Estimated: 9 mL/min — ABNORMAL LOW (ref >60)
Glucose, Bld: 166 mg/dL — ABNORMAL HIGH (ref 70–99)
Potassium: 3.5 mmol/L (ref 3.5–5.1)
Sodium: 138 mmol/L (ref 135–145)
Total Bilirubin: 0.8 mg/dL (ref 0.3–1.2)
Total Protein: 6.9 g/dL (ref 6.5–8.1)

## 2021-05-11 LAB — CBC WITH DIFFERENTIAL/PLATELET
Abs Immature Granulocytes: 0.01 10*3/uL (ref 0.00–0.07)
Basophils Absolute: 0 10*3/uL (ref 0.0–0.1)
Basophils Relative: 1 %
Eosinophils Absolute: 1.2 10*3/uL — ABNORMAL HIGH (ref 0.0–0.5)
Eosinophils Relative: 29 %
HCT: 29.7 % — ABNORMAL LOW (ref 39.0–52.0)
Hemoglobin: 9.3 g/dL — ABNORMAL LOW (ref 13.0–17.0)
Immature Granulocytes: 0 %
Lymphocytes Relative: 26 %
Lymphs Abs: 1.1 10*3/uL (ref 0.7–4.0)
MCH: 30.7 pg (ref 26.0–34.0)
MCHC: 31.3 g/dL (ref 30.0–36.0)
MCV: 98 fL (ref 80.0–100.0)
Monocytes Absolute: 0.8 10*3/uL (ref 0.1–1.0)
Monocytes Relative: 20 %
Neutro Abs: 1 10*3/uL — ABNORMAL LOW (ref 1.7–7.7)
Neutrophils Relative %: 24 %
Platelets: 94 10*3/uL — ABNORMAL LOW (ref 150–400)
RBC: 3.03 MIL/uL — ABNORMAL LOW (ref 4.22–5.81)
RDW: 15.9 % — ABNORMAL HIGH (ref 11.5–15.5)
WBC: 4 10*3/uL (ref 4.0–10.5)
nRBC: 0 % (ref 0.0–0.2)

## 2021-05-11 LAB — VITAMIN B12: Vitamin B-12: 872 pg/mL (ref 180–914)

## 2021-05-11 LAB — HEPATITIS B SURFACE ANTIBODY, QUANTITATIVE: Hep B S AB Quant (Post): 17.4 m[IU]/mL (ref >9.9)

## 2021-05-11 LAB — CULTURE, BLOOD (ROUTINE X 2)

## 2021-05-11 LAB — GLUCOSE, CAPILLARY
Glucose-Capillary: 141 mg/dL — ABNORMAL HIGH (ref 70–99)
Glucose-Capillary: 166 mg/dL — ABNORMAL HIGH (ref 70–99)
Glucose-Capillary: 168 mg/dL — ABNORMAL HIGH (ref 70–99)
Glucose-Capillary: 140 mg/dL — ABNORMAL HIGH (ref 70–99)

## 2021-05-11 LAB — C-REACTIVE PROTEIN: CRP: 6 mg/dL — ABNORMAL HIGH (ref <1.0)

## 2021-05-11 LAB — TSH: TSH: 1.332 u[IU]/mL (ref 0.350–4.500)

## 2021-05-11 LAB — D-DIMER, QUANTITATIVE: D-Dimer, Quant: 0.66 ug/mL-FEU — ABNORMAL HIGH (ref 0.00–0.50)

## 2021-05-11 LAB — AMMONIA: Ammonia: 29 umol/L (ref 9–35)

## 2021-05-11 MED ORDER — HYDRALAZINE HCL 25 MG PO TABS
25.0000 mg | ORAL_TABLET | Freq: Four times a day (QID) | ORAL | Status: DC | PRN
  Administered 2021-05-11: 25 mg via ORAL
  Filled 2021-05-11: qty 1

## 2021-05-11 MED ORDER — AMLODIPINE BESYLATE 10 MG PO TABS
10.0000 mg | ORAL_TABLET | Freq: Every day | ORAL | Status: DC
  Administered 2021-05-13: 10 mg via ORAL
  Filled 2021-05-11: qty 1

## 2021-05-11 NOTE — Evaluation (Signed)
Occupational Therapy Evaluation and Discharge ?Patient Details ?Name: Jorge [name] ?MRN: [id number] ?DOB: 1970-03-27 ?Today's Date: 05/11/2021 ? ? ?History of Present Illness Pt is a 51 year old man admitted with somnolence after HD. COVID positive. PMH: B BKA with prostheses, recurrent stroke, DM2, HTN, chronic anemia, anxiety.  ? ?Clinical Impression ?  ?Pt is functioning modified independently. VSS on RA. No OT needs.   ?   ? ?Recommendations for follow up therapy are one component of a multi-disciplinary discharge planning process, led by the attending physician.  Recommendations may be updated based on patient status, additional functional criteria and insurance authorization.  ? ?Follow Up Recommendations ? No OT follow up  ?  ?Assistance Recommended at Discharge PRN  ?Patient can return home with the following Assistance with cooking/housework;Direct supervision/assist for medications management;Assist for transportation ? ?  ?Functional Status Assessment ? Patient has had a recent decline in their functional status and demonstrates the ability to make significant improvements in function in a reasonable and predictable amount of time.  ?Equipment Recommendations ? None recommended by OT  ?  ?Recommendations for Other Services   ? ? ?  ?Precautions / Restrictions Precautions ?Precautions: Other (comment) (droplet, airborne)  ? ?  ? ?Mobility Bed Mobility ?Overal bed mobility: Modified Independent ?  ?  ?  ?  ?  ?  ?  ?  ? ?Transfers ?Overall transfer level: Modified independent ?  ?  ?  ?  ?  ?  ?  ?  ?  ?  ? ?  ?Balance Overall balance assessment: Needs assistance ?  ?Sitting balance-Leahy Scale: Normal ?  ?  ?  ?  ?  ?  ?  ?  ?  ?  ?  ?  ?  ?  ?  ?  ?   ? ?ADL either performed or assessed with clinical judgement  ? ?ADL Overall ADL's : Modified independent ?  ?  ?  ?  ?  ?  ?  ?  ?  ?  ?  ?  ?  ?  ?  ?  ?  ?  ?  ?   ? ? ? ?Vision Patient Visual Report: No change from baseline;Blurring of vision (L eye  bluriness) ?   ?   ?Perception   ?  ?Praxis   ?  ? ?Pertinent Vitals/Pain Pain Assessment ?Pain Assessment: No/denies pain  ? ? ? ?Hand Dominance   ?  ?Extremity/Trunk Assessment Upper Extremity Assessment ?Upper Extremity Assessment: Overall WFL for tasks assessed ?  ?Lower Extremity Assessment ?Lower Extremity Assessment:  (B BKA) ?  ?Cervical / Trunk Assessment ?Cervical / Trunk Assessment: Normal ?  ?Communication Communication ?Communication: No difficulties ?  ?Cognition Arousal/Alertness: Awake/alert ?Behavior During Therapy: [facility] for tasks assessed/performed ?Overall Cognitive Status: Within Functional Limits for tasks assessed ?  ?  ?  ?  ?  ?  ?  ?  ?  ?  ?  ?  ?  ?  ?  ?  ?  ?  ?  ?General Comments    ? ?  ?Exercises   ?  ?Shoulder Instructions    ? ? ?Home Living Family/patient expects to be discharged to:: Skilled nursing facility ?  ?  ?  ?  ?  ?  ?  ?  ?  ?  ?  ?  ?  ?  ?  ?  ?  ?  ? ?  ?Prior Functioning/Environment Prior Level of Function :  Independent/Modified Independent ?  ?  ?  ?  ?  ?  ?Mobility Comments: walks with prostheses and walker ?ADLs Comments: modified independent ?  ? ?  ?  ?OT Problem List:   ?  ?   ?OT Treatment/Interventions:    ?  ?OT Goals(Current goals can be found in the care plan section)    ?OT Frequency:   ?  ? ?Co-evaluation   ?  ?  ?  ?  ? ?  ?AM-PAC OT "6 Clicks" Daily Activity     ?Outcome Measure Help from another person eating meals?: None ?Help from another person taking care of personal grooming?: None ?Help from another person toileting, which includes using toliet, bedpan, or urinal?: None ?Help from another person bathing (including washing, rinsing, drying)?: None ?Help from another person to put on and taking off regular upper body clothing?: None ?Help from another person to put on and taking off regular lower body clothing?: None ?6 Click Score: 24 ?  ?End of Session Nurse Communication: Mobility status ? ?Activity Tolerance: Patient tolerated treatment  well ?Patient left: in chair;with call bell/phone within reach;with chair alarm set ? ?OT Visit Diagnosis: Muscle weakness (generalized) (M62.81)  ?              ?Time: 9233-0076 ?OT Time Calculation (min): 35 min ?Charges:  OT General Charges ?$OT Visit: 1 Visit ?OT Evaluation ?$OT Eval Low Complexity: 1 Low ?OT Treatments ?$Self Care/Home Management : 8-22 mins ? ?Jorge [name], OTR/L ?Acute Rehabilitation Services ?Pager: [phone] ?Office: [phone]  ? ?Jorge [name] ?05/11/2021, 12:41 PM ?

## 2021-05-11 NOTE — TOC Progression Note (Addendum)
Transition of Care (TOC) - Initial/Assessment Note  ? ? ?Patient Details  ?Name: Jorge [name] ?MRN: [id number] ?Date of Birth: 30-Dec-1970 ? ?Transition of Care (TOC) CM/SW Contact:    ?Paulene Floor Jaaliyah [place]o, LCSWA ?Phone Number: ?05/11/2021, 8:09 AM ? ?Clinical Narrative:                 ?CSW contacted [facility] to inquire about whether the patient is long term or short term at the facility and is awaiting a response.   ? ?08:12-  CSW informed that the patient is LTC at [facility] and can return when medically ready.   ? ?10:15- CSW contacted the facility to inquire about whether there is an isolation period needed prior to the patient returning as the patient is COVID positive.  CSW is awaiting a response.   ? ?16:22-  CSW notified that no isolation time is needed prior to the patient returning to  SNF.  ? ? ?TOC will continue to follow.   ? ?  ? ? ?Patient Goals and CMS Choice ?  ?  ?  ? ?Expected Discharge Plan and Services ?  ?  ?  ?  ?  ?                ?  ?  ?  ?  ?  ?  ?  ?  ?  ?  ? ?Prior Living Arrangements/Services ?  ?  ?  ?       ?  ?  ?  ?  ? ?Activities of Daily Living ?Home Assistive Devices/Equipment: Wheelchair ?ADL Screening (condition at time of admission) ?Patient's cognitive ability adequate to safely complete daily activities?: Yes ?Is the patient deaf or have difficulty hearing?: No ?Does the patient have difficulty seeing, even when wearing glasses/contacts?: Yes ?Does the patient have difficulty concentrating, remembering, or making decisions?: No ?Patient able to express need for assistance with ADLs?: Yes ?Does the patient have difficulty dressing or bathing?: No ?Independently performs ADLs?: Yes (appropriate for developmental age) ?Does the patient have difficulty walking or climbing stairs?: Yes ?Weakness of Legs: Both ?Weakness of Arms/Hands: None ? ?Permission Sought/Granted ?  ?  ?   ?   ?   ?   ? ?Emotional Assessment ?  ?  ?  ?  ?  ?  ? ?Admission diagnosis:  Fever, unspecified fever  cause [R50.9] ?Diarrhea, unspecified type [R19.7] ?COVID-19 virus infection [U07.1] ?Patient Active Problem List  ? Diagnosis Date Noted  ? PAD (peripheral artery disease) ([place]) 05/08/2021  ? Thrombocytopenia ([place]) 05/08/2021  ? COVID-19 virus infection 05/08/2021  ? Malnutrition of moderate degree 07/14/2020  ? AMS (altered mental status) 07/13/2020  ? Hypertensive urgency 07/13/2020  ? Hyperlipidemia associated with type 2 diabetes mellitus ([place])   ? History of CVA (cerebrovascular accident) 05/10/2020  ? Stupor 05/10/2020  ? TIA (transient ischemic attack) 05/09/2020  ? Acute encephalopathy 10/03/2019  ? Febrile illness 08/28/2019  ? Personal history of anaphylaxis 08/28/2019  ? Coagulation defect, unspecified ([place]) 05/05/2019  ? Gastroparesis due to DM ([place]) 04/28/2019  ? Labile blood glucose   ? Anemia of chronic disease   ? Anemia of chronic kidney failure   ? S/P BKA (below knee amputation) bilateral ([place]) 03/31/2019  ? ESRD on peritoneal dialysis [facility])   ? ESRD (end stage renal disease) on dialysis ([place]) 03/19/2019  ? Hypertension associated with diabetes ([place])   ? Type 2 diabetes mellitus with chronic kidney disease on chronic dialysis, with  long-term current use of insulin ([place])   ? Encounter for adequacy testing for peritoneal dialysis ([place]) 01/10/2019  ? Other disorders of bilirubin metabolism 01/10/2019  ? 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 Other disorders resulting from impaired renal tubular function 07/05/2016  ? Secondary hyperparathyroidism of renal origin ([place]) 07/05/2016  ? ?PCP:  No primary care provider on file. ?Pharmacy:   ?[facility] [place], [place] ?[place] ?[place] [address] ?Phone: [phone] Fax: [phone] ? ?[place], [place] ?[address] ?[address] ?[place] [address] ?Phone: [phone] Fax: [phone] ? ? ? ? ?Social Determinants of Health (SDOH)  Interventions ?  ? ?Readmission Risk Interventions ?   ? View : No data to display.  ?  ?  ?  ? ? ? ?

## 2021-05-11 NOTE — Progress Notes (Signed)
?[facility] ?Progress Note  ? ?Subjective:  Completed dialysis net UF 2.5L. Feels like he didn't get enough fluid off.  ?On room air. Nasal congestion but no dyspnea, chest pain.  ? ?Objective ?Vitals:  ? 05/11/21 0505 05/11/21 9242 05/11/21 6834 05/11/21 0913  ?BP:  (!) 194/88 (!) 170/87 (!) 200/94  ?Pulse:  78  74  ?Resp:  18  18  ?Temp:  (!) 97.4 ?F (36.3 ?C) 98.6 ?F (37 ?C) 98.4 ?F (36.9 ?C)  ?TempSrc:  Oral Oral   ?SpO2:  100%  98%  ?Weight: 72.4 kg     ?Height:      ?  ? ?Additional Objective ?Labs: ?Basic Metabolic Panel: ?Recent Labs  ?Lab 05/09/21 ?1443 05/10/21 ?1962 05/11/21 ?2297  ?NA 141 137 138  ?K 4.7 3.9 3.5  ?CL 102 98 97*  ?CO2 25 24 29   ?GLUCOSE 129* 78 166*  ?BUN 34* 41* 23*  ?CREATININE 8.13* 9.34* 6.59*  ?CALCIUM 8.7* 8.5* 8.5*  ?PHOS 4.7* 4.2  --   ? ?CBC: ?Recent Labs  ?Lab 05/08/21 ?1800 05/09/21 ?1443 05/10/21 ?9892 05/11/21 ?1194  ?WBC 7.1 4.7 4.2 4.0  ?NEUTROABS 4.6 2.2  --  1.0*  ?HGB 9.6* 9.3* 8.6* 9.3*  ?HCT 30.7* 29.6* 27.9* 29.7*  ?MCV 99.7 98.3 99.3 98.0  ?PLT 113* PLATELET CLUMPS NOTED ON SMEAR, UNABLE TO ESTIMATE 96* 94*  ? ?Blood Culture ?   ?Component Value Date/Time  ? SDES BLOOD SITE NOT SPECIFIED 05/09/2021 2224  ? SDES BLOOD SITE NOT SPECIFIED 05/09/2021 2224  ? SPECREQUEST  05/09/2021 2224  ?  BOTTLES DRAWN AEROBIC AND ANAEROBIC Blood Culture adequate volume  ? SPECREQUEST  05/09/2021 2224  ?  BOTTLES DRAWN AEROBIC ONLY Blood Culture results may not be optimal due to an inadequate volume of blood received in culture bottles  ? CULT  05/09/2021 2224  ?  NO GROWTH 2 DAYS ?Performed at [facility], [place] [address], [place], [place] [address] ?  ? CULT  05/09/2021 2224  ?  NO GROWTH 2 DAYS ?Performed at [facility], [place] [address], [place], [place] [address] ?  ? REPTSTATUS PENDING 05/09/2021 2224  ? REPTSTATUS PENDING 05/09/2021 2224  ? ? ? ?Physical Exam ?General: Alert, sitting up in bed, nad ?Heart: RRR ?Lungs: Clear bilaterally  ?Abdomen: soft   ?Extremities: Bilat BKA No edema  ?Dialysis Access: R AVF ? ?Medications: ? ? amLODipine  10 mg Oral Daily  ? atorvastatin  40 mg Oral Daily  ? Chlorhexidine Gluconate Cloth  6 each Topical Q0600  ? clopidogrel  75 mg Oral Daily  ? folic acid  1 mg Oral Daily  ? heparin  5,000 Units Subcutaneous Q8H  ? insulin aspart  0-6 Units Subcutaneous TID WC  ? molnupiravir EUA  4 capsule Oral BID  ? sevelamer carbonate  1,600 mg Oral 2 times per day  ? sevelamer carbonate  3,200 mg Oral 3 times per day  ? sodium chloride  1-2 spray Each Nare Q2H  ? ? ? Home meds include - plavix, lipitor, renvela 4 ac tid ?  ?   CXR 5/01 - no active disease ?  ? OP HD: [facility]WF [facility] ? 4h  2/2.5 bath  400/1.5  72.5kg  P2  Hep none  RUA AVF ? - mircera 75 q4, last 4/24 ? - venofer 50 q wk on friday ? - hectorol 4 ug tiw IV ? - last HD 5/01 82.3 > 79.7kg ? - last  Hb 9.8 on 4/26 ?  ?  ?  ?Assessment/ Plan: ?COVID 19 infection - w/ high fevers and hypoxemia on presentation. Started on molnupiravir per pmd.    ?Acute hypoxic resp failure - per pmd. CXR neg, no signs of vol overload. Today is on room air. ?AMS - improving.  ?ESRD - on HD MWF. Next HD 5/5.  ?Volume- Blood pressure high today. Did reach dry weight post HD. Challenge EDW next treatment  ?DM2 - on insulin ?PAD sp bilat BKA ?H/o CVA ?Anemia esrd - Hb 9s here, esa given 4/24 at OP HD. Follow.  ?MBD ckd - CCa and phos in range. Cont binder, vdra. ?  ? ?Lynnda Child PA-C ?[facility] ?05/11/2021,10:25 AM ? ? ? ? ? ? ? ?

## 2021-05-11 NOTE — Progress Notes (Signed)
?PROGRESS NOTE ? ? ? ?Jorge [name]  YOV:[id number] DOB: 1970-09-21 DOA: 05/08/2021 ?PCP: No primary care provider on file.  ? ?Brief Narrative:  ?51 y.o. male with PMH significant for ESRD on HD, DM2, HTN, HLD, PAD s/p b/l BKA, recurrent stroke on Plavix, chronic anemia, anxiety, history of C. difficile diarrhea, diabetic gastroparesis presented with increased somnolence after dialysis.  On presentation, patient was sleepy but was febrile to 103.4.  Chest x-ray did not show any focal consolidation, edema or effusion.  He tested positive for COVID.  He was started on molnupiravir.  Nephrology was consulted. ? ?Assessment & Plan: ?  ?COVID-19 virus infection ?Acute respiratory failure with hypoxia ?-Presented with excessive somnolence, fever. Had intermittent tachypnea requiring supplemental oxygen for hypoxia. ?-COVID PCR positive.  Chest x-ray negative for pneumonia ?-Initially started on supplemental oxygen.  Currently is on room air.  No temperature spikes over the last 24 hours. ?-Continue molnupiravir  ?COVID-19 Labs ? ?Recent Labs  ?  05/11/21 ?0312  ?DDIMER 0.66*  ?CRP 6.0*  ? ? ?Lab Results  ?Component Value Date  ? SARSCOV2NAA POSITIVE (A) 05/08/2021  ? [place] NEGATIVE 07/29/2020  ? [place] NEGATIVE 07/13/2020  ? [place] NEGATIVE 05/09/2020  ?-Monitor inflammatory markers daily ? ?Acute metabolic encephalopathy ?-Possibly from above.  Monitor mental status.  Slowly improving. ?-B12, ammonia, TSH levels normal.  LFTs normal.  Continue folic acid empirically. ?-Fall precautions.   ?-Neurontin, Ativan and Phenergan on hold for now. ? ?ESRD on hemodialysis ?-Nephrology following.  Dialysis as per nephrology schedule ? ?Diabetes mellitus type 2 with hypoglycemia ?-Continue CBGs with SSI.  Lantus on hold ? ?Hypertension ?-Blood pressure elevated.  Will start amlodipine and as needed hydralazine.  Monitor. ? ?Thrombocytopenia ?-No signs of bleeding.  Monitor ? ?Anemia of chronic disease  ?-from renal  failure.  Hemoglobin stable ? ?PAD status post bilateral BKA  ?History of recurrent stroke ?Hyperlipidemia ?-Continue Plavix and statin ? ? ?DVT prophylaxis: Heparin ?Code Status: Full ?Family Communication: None at bedside ?Disposition Plan: ?Status is: Inpatient ?Remains inpatient appropriate because: Of severity of illness.  Return back to SNF in 1 to 2 days if clinically improves  ? ?Consultants: Nephrology ? ?Procedures: None ? ?Antimicrobials: None ? ? ?Subjective: ?Patient seen and examined at bedside.  Poor historian.  No seizures, vomiting, fever or agitation reported by nursing staff. ?Objective: ?Vitals:  ? 05/10/21 2147 05/11/21 0505 05/11/21 7412 05/11/21 8786  ?BP: (!) 136/45  (!) 194/88 (!) 170/87  ?Pulse: 73  78   ?Resp: 18  18   ?Temp: 98.2 ?F (36.8 ?C)  (!) 97.4 ?F (36.3 ?C) 98.6 ?F (37 ?C)  ?TempSrc:   Oral Oral  ?SpO2: 100%  100%   ?Weight: 73 kg 72.4 kg    ?Height:      ? ? ?Intake/Output Summary (Last 24 hours) at 05/11/2021 0741 ?Last data filed at 05/11/2021 0505 ?Gross per 24 hour  ?Intake 0 ml  ?Output 2570 ml  ?Net -2570 ml  ? ?Filed Weights  ? 05/10/21 1130 05/10/21 2147 05/11/21 0505  ?Weight: 73 kg 73 kg 72.4 kg  ? ? ?Examination: ? ?General: On room air.  No distress.  Looks chronically ill and deconditioned. ?ENT/neck: No thyromegaly.  JVD is not elevated  ?respiratory: Decreased breath sounds at bases bilaterally with some crackles; no wheezing  ?CVS: S1-S2 heard, rate controlled currently ?Abdominal: Soft, nontender, slightly distended; no organomegaly, normal bowel sounds are heard ?Extremities: Bilateral BKA present ?CNS: More awake this morning, answers some questions  but still slow to respond.  Poor historian.  No focal neurologic deficit.  Moves extremities ?Lymph: No obvious lymphadenopathy ?Skin: No obvious ecchymosis/lesions  ?psych: Flat affect.  Currently not agitated. ?musculoskeletal: No obvious joint swelling/deformity ? ? ? ?Data Reviewed: I have personally reviewed  following labs and imaging studies ? ?CBC: ?Recent Labs  ?Lab 05/08/21 ?1800 05/09/21 ?1443 05/10/21 ?3295 05/11/21 ?1884  ?WBC 7.1 4.7 4.2 4.0  ?NEUTROABS 4.6 2.2  --  1.0*  ?HGB 9.6* 9.3* 8.6* 9.3*  ?HCT 30.7* 29.6* 27.9* 29.7*  ?MCV 99.7 98.3 99.3 98.0  ?PLT 113* PLATELET CLUMPS NOTED ON SMEAR, UNABLE TO ESTIMATE 96* 94*  ? ? ?Basic Metabolic Panel: ?Recent Labs  ?Lab 05/08/21 ?1800 05/09/21 ?1443 05/10/21 ?1660 05/11/21 ?6301  ?NA 140 141 137 138  ?K 4.6 4.7 3.9 3.5  ?CL 97* 102 98 97*  ?CO2 30 25 24 29   ?GLUCOSE 128* 129* 78 166*  ?BUN 25* 34* 41* 23*  ?CREATININE 5.91* 8.13* 9.34* 6.59*  ?CALCIUM 8.8* 8.7* 8.5* 8.5*  ?PHOS  --  4.7* 4.2  --   ? ? ?GFR: ?Estimated Creatinine Clearance: 13.4 mL/min (A) (by C-G formula based on SCr of 6.59 mg/dL (H)). ?Liver Function Tests: ?Recent Labs  ?Lab 05/08/21 ?1800 05/09/21 ?1443 05/10/21 ?6010 05/11/21 ?9323  ?AST 25 33  --  27  ?ALT 12 15  --  15  ?ALKPHOS 120 101  --  104  ?BILITOT 1.0 0.7  --  0.8  ?PROT 8.3* 6.9  --  6.9  ?ALBUMIN 4.0 3.2* 3.1* 3.1*  ? ? ?Recent Labs  ?Lab 05/08/21 ?1735  ?LIPASE 21  ? ? ?Recent Labs  ?Lab 05/11/21 ?0312  ?AMMONIA 29  ? ?Coagulation Profile: ?Recent Labs  ?Lab 05/08/21 ?1735  ?INR 1.5*  ? ? ?Cardiac Enzymes: ?No results for input(s): CKTOTAL, CKMB, CKMBINDEX, TROPONINI in the last 168 hours. ?BNP (last 3 results) ?No results for input(s): PROBNP in the last 8760 hours. ?HbA1C: ?No results for input(s): HGBA1C in the last 72 hours. ?CBG: ?Recent Labs  ?Lab 05/09/21 ?1942 05/10/21 ?0751 05/10/21 ?1225 05/10/21 ?1527 05/10/21 ?2146  ?GLUCAP 122* 88 73 125* 149*  ? ? ?Lipid Profile: ?No results for input(s): CHOL, HDL, LDLCALC, TRIG, CHOLHDL, LDLDIRECT in the last 72 hours. ?Thyroid Function Tests: ?Recent Labs  ?  05/11/21 ?0312  ?TSH 1.332  ? ?Anemia Panel: ?Recent Labs  ?  05/11/21 ?0312  ?VITAMINB12 872  ? ?Sepsis Labs: ?Recent Labs  ?Lab 05/08/21 ?1800 05/08/21 ?2203  ?LATICACIDVEN 3.2* 0.9  ? ? ? ?Recent Results (from the past 240  hour(s))  ?Resp Panel by RT-PCR (Flu A&B, Covid) Nasopharyngeal Swab     Status: Abnormal  ? Collection Time: 05/08/21  6:00 PM  ? Specimen: Nasopharyngeal Swab; Nasopharyngeal(NP) swabs in vial transport medium  ?Result Value Ref Range Status  ? SARS Coronavirus 2 by RT PCR POSITIVE (A) NEGATIVE Final  ?  Comment: (NOTE) ?SARS-CoV-2 target nucleic acids are DETECTED. ? ?The SARS-CoV-2 RNA is generally detectable in upper respiratory ?specimens during the acute phase of infection. Positive results are ?indicative of the presence of the identified virus, but do not rule ?out bacterial infection or co-infection with other pathogens not ?detected by the test. Clinical correlation with patient history and ?other diagnostic information is necessary to determine patient ?infection status. The expected result is Negative. ? ?Fact Sheet for Patients: ?EntrepreneurPulse.com.au ? ?Fact Sheet for Healthcare Providers: ?IncredibleEmployment.be ? ?This test is not yet approved or cleared by the [place]  States FDA and  ?has been authorized for detection and/or diagnosis of SARS-CoV-2 by ?FDA under an Emergency Use Authorization (EUA).  This EUA will ?remain in effect (meaning this test can be used) for the duration of  ?the COVID-19 declaration under Section 564(b)(1) of the [place] ct, 21 ?U.S.C. section 360bbb-3(b)(1), unless the authorization is ?terminated or revoked sooner. ? ?  ? Influenza A by PCR NEGATIVE NEGATIVE Final  ? Influenza B by PCR NEGATIVE NEGATIVE Final  ?  Comment: (NOTE) ?The Xpert Xpress SARS-CoV-2/FLU/RSV plus assay is intended as an aid ?in the diagnosis of influenza from Nasopharyngeal swab specimens and ?should not be used as a sole basis for treatment. Nasal washings and ?aspirates are unacceptable for Xpert Xpress SARS-CoV-2/FLU/RSV ?testing. ? ?Fact Sheet for Patients: ?EntrepreneurPulse.com.au ? ?Fact Sheet for Healthcare  Providers: ?IncredibleEmployment.be ? ?This test is not yet approved or cleared by the [place] FDA and ?has been authorized for detection and/or diagnosis of SARS-CoV-2 by ?FDA under an Emergency Use Authorization (E

## 2021-05-12 LAB — GLUCOSE, CAPILLARY
Glucose-Capillary: 135 mg/dL — ABNORMAL HIGH (ref 70–99)
Glucose-Capillary: 113 mg/dL — ABNORMAL HIGH (ref 70–99)
Glucose-Capillary: 214 mg/dL — ABNORMAL HIGH (ref 70–99)
Glucose-Capillary: 95 mg/dL (ref 70–99)

## 2021-05-12 MED ORDER — AMLODIPINE BESYLATE 10 MG PO TABS: 10.0000 mg | ORAL_TABLET | Freq: Every day | ORAL | 0 refills | Status: DC

## 2021-05-12 NOTE — Progress Notes (Signed)
Pt to d/c today or tomorrow. Contacted [facility] and spoke to [facility]. Clinic does not have any availability to provide treatment to pt tomorrow (off schedule) if snf would have been able to provide transport. This information provided to treatment team via secure chat. Clinic aware pt will resume care on Monday. Navigator advised CSW that pt will need to arrive at 11:30 on Monday and enter through the side entrance due to covid diagnosis. CSW to provide this info to snf.  ? ?Melven Sartorius ?Renal Navigator ?[phone] ?

## 2021-05-12 NOTE — Progress Notes (Signed)
?   05/12/21 1500  ?Clinical Encounter Type  ?Visited With Patient  ?Visit Type Initial;Other (Comment) ?(Advanced Directive)  ?Referral From Nurse  ?Consult/Referral To Chaplain  ? ? ?Chaplain responded to a spiritual consult for advanced directive education.  ?I provided the paperwork to the patient, Author. We went over it briefly, and he stated that he would review it closely later in the evening. I advised he that when he completed the forms to inform his nurse who would in turn let us know. I also shared that he could have it notarized on his own if he was discharged before completing it.  ? ?Danice Goltz ?Chaplain  ?[facility]  ?[phone] ? ? ?

## 2021-05-12 NOTE — TOC Progression Note (Signed)
Transition of Care (TOC) - Initial/Assessment Note  ? ? ?Patient Details  ?Name: Jorge [name] ?MRN: [id number] ?Date of Birth: November 30, 1970 ? ?Transition of Care (TOC) CM/SW Contact:    ?Paulene Floor Ayden Hardwick, LCSWA ?Phone Number: ?05/12/2021, 4:06 PM ? ?Clinical Narrative:                 ?CSW spoke with [place] at [facility].  The facility can accept the patient tomorrow.  When the patient is ready tomorrow, RN can call report to [phone].  He is assigned to room 108.   ? ?  ?  ? ? ?Patient Goals and CMS Choice ?  ?  ?  ? ?Expected Discharge Plan and Services ?  ?  ?  ?  ?  ?Expected Discharge Date: 05/12/21               ?  ?  ?  ?  ?  ?  ?  ?  ?  ?  ? ?Prior Living Arrangements/Services ?  ?  ?  ?       ?  ?  ?  ?  ? ?Activities of Daily Living ?Home Assistive Devices/Equipment: Wheelchair ?ADL Screening (condition at time of admission) ?Patient's cognitive ability adequate to safely complete daily activities?: Yes ?Is the patient deaf or have difficulty hearing?: No ?Does the patient have difficulty seeing, even when wearing glasses/contacts?: Yes ?Does the patient have difficulty concentrating, remembering, or making decisions?: No ?Patient able to express need for assistance with ADLs?: Yes ?Does the patient have difficulty dressing or bathing?: No ?Independently performs ADLs?: Yes (appropriate for developmental age) ?Does the patient have difficulty walking or climbing stairs?: Yes ?Weakness of Legs: Both ?Weakness of Arms/Hands: None ? ?Permission Sought/Granted ?  ?  ?   ?   ?   ?   ? ?Emotional Assessment ?  ?  ?  ?  ?  ?  ? ?Admission diagnosis:  Fever, unspecified fever cause [R50.9] ?Diarrhea, unspecified type [R19.7] ?COVID-19 virus infection [U07.1] ?Patient Active Problem List  ? Diagnosis Date Noted  ? PAD (peripheral artery disease) ([place]) 05/08/2021  ? Thrombocytopenia ([place]) 05/08/2021  ? COVID-19 virus infection 05/08/2021  ? Malnutrition of moderate degree 07/14/2020  ? AMS (altered mental status)  07/13/2020  ? Hypertensive urgency 07/13/2020  ? Hyperlipidemia associated with type 2 diabetes mellitus ([place])   ? History of CVA (cerebrovascular accident) 05/10/2020  ? Stupor 05/10/2020  ? TIA (transient ischemic attack) 05/09/2020  ? Acute encephalopathy 10/03/2019  ? Febrile illness 08/28/2019  ? Personal history of anaphylaxis 08/28/2019  ? Coagulation defect, unspecified ([place]) 05/05/2019  ? Gastroparesis due to DM ([place]) 04/28/2019  ? Labile blood glucose   ? Anemia of chronic disease   ? Anemia of chronic kidney failure   ? S/P BKA (below knee amputation) bilateral ([place]) 03/31/2019  ? ESRD on peritoneal dialysis [facility])   ? ESRD (end stage renal disease) on dialysis ([place]) 03/19/2019  ? Hypertension associated with diabetes ([place])   ? Type 2 diabetes mellitus with chronic kidney disease on chronic dialysis, with long-term current use of insulin ([place])   ? Encounter for adequacy testing for peritoneal dialysis ([place]) 01/10/2019  ? Other disorders of bilirubin metabolism 01/10/2019  ? 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 Other disorders resulting from impaired renal tubular function 07/05/2016  ? Secondary hyperparathyroidism of renal origin ([place]) 07/05/2016  ? ?PCP:  No primary care provider on file. ?Pharmacy:   ?[facility] [phone] -  [place], [name] ?[place] ?[place] [address] ?Phone: [phone] Fax: [phone] ? ?[place], [place] ?[address] ?[address] ?[place] [address] ?Phone: [phone] Fax: [phone] ? ? ? ? ?Social Determinants of Health (SDOH) Interventions ?  ? ?Readmission Risk Interventions ?   ? View : No data to display.  ?  ?  ?  ? ? ? ?

## 2021-05-12 NOTE — Progress Notes (Signed)
?[name] ?Progress Note  ? ?Subjective:    ?Seen and examined at bedside. Denies SOB, CP, and N/V. Patient is COVID (+) so plan for HD 3rd shift today. Working on seeing if his facility will accept patient either tonight after HD or tomorrow. ? ?Objective ?Vitals:  ? 05/11/21 2103 05/12/21 0500 05/12/21 0546 05/12/21 0846  ?BP: 110/78  128/76 (!) 179/89  ?Pulse: 80  68 79  ?Resp: 18  18   ?Temp: 98.2 ?F (36.8 ?C)  98.4 ?F (36.9 ?C)   ?TempSrc:   Oral   ?SpO2: 100%   100%  ?Weight:  72.5 kg    ?Height:      ? ?Physical Exam ?General: Alert, sitting up in bed, NAD ?Heart: RRR ?Lungs: Clear bilaterally  ?Abdomen: Soft  ?Extremities: Bilat BKA No edema  ?Dialysis Access: R AVF ? ?Filed Weights  ? 05/10/21 2147 05/11/21 0505 05/12/21 0500  ?Weight: 73 kg 72.4 kg 72.5 kg  ? ? ?Intake/Output Summary (Last 24 hours) at 05/12/2021 1157 ?Last data filed at 05/12/2021 0800 ?Gross per 24 hour  ?Intake 1062 ml  ?Output 0 ml  ?Net 1062 ml  ? ? ?Additional Objective ?Labs: ?Basic Metabolic Panel: ?Recent Labs  ?Lab 05/09/21 ?1443 05/10/21 ?1610 05/11/21 ?9604  ?NA 141 137 138  ?K 4.7 3.9 3.5  ?CL 102 98 97*  ?CO2 25 24 29   ?GLUCOSE 129* 78 166*  ?BUN 34* 41* 23*  ?CREATININE 8.13* 9.34* 6.59*  ?CALCIUM 8.7* 8.5* 8.5*  ?PHOS 4.7* 4.2  --   ? ?Liver Function Tests: ?Recent Labs  ?Lab 05/08/21 ?1800 05/09/21 ?1443 05/10/21 ?5409 05/11/21 ?8119  ?AST 25 33  --  27  ?ALT 12 15  --  15  ?ALKPHOS 120 101  --  104  ?BILITOT 1.0 0.7  --  0.8  ?PROT 8.3* 6.9  --  6.9  ?ALBUMIN 4.0 3.2* 3.1* 3.1*  ? ?Recent Labs  ?Lab 05/08/21 ?1735  ?LIPASE 21  ? ?CBC: ?Recent Labs  ?Lab 05/08/21 ?1800 05/09/21 ?1443 05/10/21 ?1478 05/11/21 ?2956  ?WBC 7.1 4.7 4.2 4.0  ?NEUTROABS 4.6 2.2  --  1.0*  ?HGB 9.6* 9.3* 8.6* 9.3*  ?HCT 30.7* 29.6* 27.9* 29.7*  ?MCV 99.7 98.3 99.3 98.0  ?PLT 113* PLATELET CLUMPS NOTED ON SMEAR, UNABLE TO ESTIMATE 96* 94*  ? ?Blood Culture ?   ?Component Value Date/Time  ? SDES BLOOD SITE NOT SPECIFIED 05/09/2021  2224  ? SDES BLOOD SITE NOT SPECIFIED 05/09/2021 2224  ? SPECREQUEST  05/09/2021 2224  ?  BOTTLES DRAWN AEROBIC AND ANAEROBIC Blood Culture adequate volume  ? SPECREQUEST  05/09/2021 2224  ?  BOTTLES DRAWN AEROBIC ONLY Blood Culture results may not be optimal due to an inadequate volume of blood received in culture bottles  ? CULT  05/09/2021 2224  ?  NO GROWTH 3 DAYS ?Performed at [facility], [place] [address], [place], [place] [address] ?  ? CULT  05/09/2021 2224  ?  NO GROWTH 3 DAYS ?Performed at [facility], [place] [address] [place], [place] [address] ?  ? REPTSTATUS PENDING 05/09/2021 2224  ? REPTSTATUS PENDING 05/09/2021 2224  ? ? ?Cardiac Enzymes: ?No results for input(s): CKTOTAL, CKMB, CKMBINDEX, TROPONINI in the last 168 hours. ?CBG: ?Recent Labs  ?Lab 05/11/21 ?1147 05/11/21 ?1642 05/11/21 ?2102 05/12/21 ?6962 05/12/21 ?1154  ?GLUCAP 166* 168* 140* 135* 113*  ? ?Iron Studies: No results for input(s): IRON, TIBC, TRANSFERRIN, FERRITIN in the last 72 hours. ?Lab  Results  ?Component Value Date  ? INR 1.5 (H) 05/08/2021  ? INR 1.1 05/09/2020  ? INR 1.1 12/15/2019  ? ?Studies/Results: ?No results found. ? ?Medications: ? ? amLODipine  10 mg Oral Daily  ? atorvastatin  40 mg Oral Daily  ? Chlorhexidine Gluconate Cloth  6 each Topical Q0600  ? clopidogrel  75 mg Oral Daily  ? folic acid  1 mg Oral Daily  ? heparin  5,000 Units Subcutaneous Q8H  ? insulin aspart  0-6 Units Subcutaneous TID WC  ? molnupiravir EUA  4 capsule Oral BID  ? sevelamer carbonate  1,600 mg Oral 2 times per day  ? sevelamer carbonate  3,200 mg Oral 3 times per day  ? sodium chloride  1-2 spray Each Nare Q2H  ? ? ?Dialysis Orders: ?[facility] ? 4h  2/2.5 bath  400/1.5  72.5kg  P2  Hep none  RUA AVF ? - mircera 75 q4, last 4/24 ? - venofer 50 q wk on friday ? - hectorol 4 ug tiw IV ? - last HD 5/01 82.3 > 79.7kg ? - last Hb 9.8 on 4/26 ? ?Assessment/Plan: ?COVID 19 infection - w/ high fevers and hypoxemia on presentation. Started on  molnupiravir per pmd.    ?Acute hypoxic resp failure - per pmd. CXR neg, no signs of vol overload. Today is on room air. ?AMS - improving.  ?ESRD - on HD MWF. HD today 3rd shift (patient is covid (+) see above).  ?Volume- Blood pressure high today. Did reach dry weight post HD. Challenge EDW next treatment  ?DM2 - on insulin ?PAD sp bilat BKA ?H/o CVA ?Anemia esrd - Hb 9s here, esa given 4/24 at OP HD. Follow.  ?MBD ckd - CCa and phos in range. Cont binder, vdra. ? ?Tobie Poet, NP ?[facility] ?05/12/2021,11:57 AM ? LOS: 3 days  ?  ?

## 2021-05-12 NOTE — Discharge Summary (Addendum)
PatientPhysician Discharge Summary  ?JRUE [name] IEP:[id number] DOB: 1970-10-29 DOA: 05/08/2021 ? ?PCP: No primary care provider on file. ? ?Admit date: 05/08/2021 ?Discharge date: 05/13/2021 ?30 Day Unplanned Readmission Risk Score   ? ?Flowsheet Row ED to Hosp-Admission (Current) from 05/08/2021 in [facility]  ?30 Day Unplanned Readmission Risk Score (%) 24.4 Filed at 05/12/2021 0801  ? ?  ? ? This score is the patient's risk of an unplanned readmission within 30 days of being discharged (0 -100%). The score is based on dignosis, age, lab data, medications, orders, and past utilization.   ?Low:  0-14.9   Medium: 15-21.9   High: 22-29.9   Extreme: 30 and above ? ?  ? ?  ? ? ? ?Admitted From: [facility] ?Disposition:  [facility] ? ?Recommendations for Outpatient Follow-up:  ?Follow up with PCP in 1-2 weeks ?Please obtain BMP/CBC in one week ?Please follow up with your PCP on the following pending results: ?Unresulted Labs (From admission, onward)  ? ?  Start     Ordered  ? 05/08/21 1735  Urine Culture  (Septic presentation on arrival (screening labs, nursing and treatment orders for obvious sepsis))  ONCE - URGENT,   URGENT       ?Question:  Indication  Answer:  Sepsis  ? 05/08/21 1735  ? 05/08/21 1636  Urinalysis, Routine w reflex microscopic  Once,   R       ? 05/08/21 1635  ? ?  ?  ? ?  ?  ? ? ?Home Health: None ?Equipment/Devices: None ? ?Discharge Condition: Stable ?CODE STATUS: Full code ?Diet recommendation: Renal ? ?Subjective: Seen and examined.  Fully alert and oriented.  No complaints.  Agreeable with the discharge plan today. ? ?Brief/Interim Summary: 51 y.o. male with PMH significant for ESRD on HD, DM2, HTN, HLD, PAD s/p b/l BKA, recurrent stroke on Plavix, chronic anemia, anxiety, history of C. difficile diarrhea, diabetic gastroparesis presented with increased somnolence after dialysis.  On presentation, patient was sleepy but was febrile to 103.4.  Chest x-ray did not  show any focal consolidation, edema or effusion.  He tested positive for COVID.  He was started on molnupiravir.  Nephrology was consulted.  He was initially started on supplemental oxygen but has been on room air for last 2 to 3 days.  He has not had any temperature spike for last 48 hours.  He is going to complete his 5 days of molnupiravir today.  His acute encephalopathy has resolved and he is fully alert and oriented.  Plan is to discharge him today after his dialysis.  Will resume rest of the home medications. ?  ?PAD status post bilateral BKA  ?History of recurrent stroke ?Hyperlipidemia ?-Continue Plavix and statin ? ?PS: Patient seen and examined.  He has no complaints.  Patient was supposed to be discharged after dialysis yesterday but since the dialysis was third shift due to him being COVID-positive, it was too late for him to be discharged so he ended up staying overnight.  There is no change clinically from yesterday.  He is stable and he will be discharged today. ? ?Discharge plan was discussed with patient and/or family member and they verbalized understanding and agreed with it.  ?Discharge Diagnoses:  ?Principal Problem: ?  Febrile illness ?Active Problems: ?  COVID-19 virus infection ?  Acute encephalopathy ?  ESRD (end stage renal disease) on dialysis [facility]) ?  Type 2 diabetes mellitus with chronic kidney disease on  chronic dialysis, with long-term current use of insulin ([name]) ?  Anemia of chronic kidney failure ?  History of CVA (cerebrovascular accident) ?  Hyperlipidemia associated with type 2 diabetes mellitus ([place]) ?  PAD (peripheral artery disease) ([place]) ?  Thrombocytopenia ([place]) ? ? ? ?Discharge Instructions ? ? ?Allergies as of 05/13/2021   ? ?   Reactions  ? Lactose Intolerance (gi) Diarrhea, Nausea Only  ? ?  ? ?  ?Medication List  ?  ? ?STOP taking these medications   ? ?LORazepam 0.5 MG tablet ?Commonly known as: ATIVAN ?  ?multivitamin Tabs tablet ?  ? ?  ? ?TAKE these medications    ? ?amLODipine 10 MG tablet ?Commonly known as: NORVASC ?Take 1 tablet (10 mg total) by mouth daily. ?  ?Artificial Tears 0.2-0.2-1 % Soln ?Generic drug: Glycerin-Hypromellose-PEG 400 ?Place 1 drop into both eyes 4 (four) times daily. ?  ?atorvastatin 40 MG tablet ?Commonly known as: LIPITOR ?Take 1 tablet (40 mg total) by mouth daily. ?  ?clopidogrel 75 MG tablet ?Commonly known as: PLAVIX ?Take 1 tablet (75 mg total) by mouth daily. ?  ?diclofenac Sodium 1 % Gel ?Commonly known as: VOLTAREN ?Apply 2 g topically See admin instructions. Apply 2 grams of gel to the left hand three times a day ?  ?Ensure Clear Liqd ?Take 240 mLs by mouth See admin instructions. Mix 240 ml's with sugar substitute and drink every morning ?  ?furosemide 20 MG tablet ?Commonly known as: LASIX ?Take 20 mg by mouth in the morning and at bedtime. ?  ?gabapentin 300 MG capsule ?Commonly known as: NEURONTIN ?Take 300 mg by mouth 3 (three) times daily. ?  ?glucagon 1 MG Solr injection ?Commonly known as: GLUCAGEN ?Inject 1 mg into the muscle as needed for low blood sugar. ?  ?HumaLOG KwikPen 100 UNIT/ML KwikPen ?Generic drug: insulin lispro ?Inject 0-10 Units into the skin See admin instructions. Inject 0-10 units into the skin three times a day with meals, per sliding scale: BGL 0-150 = give nothing; 151-200 = 2 units; 201-250 = 4 units; 251-300 = 6 units; 301-350 = 8 units; 351-400 = 10 units; call PCP if <70 or >400 ?  ?Imodium A-D 2 MG tablet ?Generic drug: loperamide ?Take 2-4 mg by mouth See admin instructions. Take 4 mg as an initial dose, then decrease to 2 mg after each unformed stool- not to exceed 24 mg in 48 hours and call MD if diarrhea persists greater than 48 hours ?  ?Lantus SoloStar 100 UNIT/ML Solostar Pen ?Generic drug: insulin glargine ?Inject 5 Units into the skin at bedtime. ?  ?multivitamin tablet ?Take 1 tablet by mouth at bedtime. ?  ?OXYGEN ?Inhale 2 L/min into the lungs as needed (if sats drop below 92% during  episodes of anxiety). ?  ?Preparation H 1-0.25-14.4-15 % Crea ?Generic drug: Pramox-PE-Glycerin-Petrolatum ?Apply 1 application. topically every 6 (six) hours as needed (for hemorrhoids). ?  ?promethazine 25 MG tablet ?Commonly known as: PHENERGAN ?Take 25 mg by mouth every 6 (six) hours as needed for nausea or vomiting. ?  ?ProRenal + D Tabs ?Take 1,000 Units by mouth in the morning. ?  ?Refresh Lacri-Lube Oint ?Place into both eyes See admin instructions. Place 0.25 ribbon into each eye at bedtime ?  ?Refresh Liquigel 1 % Gel ?Generic drug: Carboxymethylcellulose Sodium ?Place 1 drop into both eyes at bedtime. ?  ?sevelamer carbonate 800 MG tablet ?Commonly known as: RENVELA ?Take 1,600-3,200 mg by mouth 2 (two) times daily. Take  1,600 mg by mouth two times a day (at 2 PM and 9 PM) and 3,200 mg three times a day "for dialysis" (at 7:30 AM, 12 NOON, and 5 PM) ?  ?Vitamin D (Ergocalciferol) 1.25 MG (50000 UNIT) Caps capsule ?Commonly known as: DRISDOL ?Take 50,000 Units by mouth every Saturday. ?  ? ?  ? ? ?Allergies  ?Allergen Reactions  ? Lactose Intolerance (Gi) Diarrhea and Nausea Only  ? ? ?Consultations: Neurology ? ? ?Procedures/Studies: ?DG Chest Port 1 View ? ?Result Date: 05/08/2021 ?CLINICAL DATA:  Sepsis EXAM: PORTABLE CHEST 1 VIEW COMPARISON:  08/02/2020 FINDINGS: The heart size and mediastinal contours are within normal limits. Both lungs are clear. The visualized skeletal structures are unremarkable. IMPRESSION: No active disease. Electronically Signed   By: Ulyses Jarred M.D.   On: 05/08/2021 20:20   ? ? ?Discharge Exam: ?Vitals:  ? 05/13/21 0447 05/13/21 0811  ?BP: 125/70 (!) 144/76  ?Pulse: 72 86  ?Resp: 20 18  ?Temp: (!) 97.3 ?F (36.3 ?C) 97.9 ?F (36.6 ?C)  ?SpO2: 100% 100%  ? ?Vitals:  ? 05/13/21 0200 05/13/21 0230 05/13/21 0447 05/13/21 0811  ?BP: 105/61 109/82 125/70 (!) 144/76  ?Pulse: 77 77 72 86  ?Resp:  18 20 18   ?Temp:   (!) 97.3 ?F (36.3 ?C) 97.9 ?F (36.6 ?C)  ?TempSrc:    Oral  ?SpO2:    100% 100%  ?Weight:  68 kg    ?Height:      ? ? ?General: Pt is alert, awake, not in acute distress ?Cardiovascular: RRR, S1/S2 +, no rubs, no gallops ?Respiratory: CTA bilaterally, no wheezing, no rhonchi ?Abdom

## 2021-05-12 NOTE — Consult Note (Addendum)
? ?  [facility] CM Inpatient Consult ? ? ?05/12/2021 ? ?Jorge [name] ?12-31-1970 ?[id number] ? ?[facility] [[facility]] Patient: Jorge [name] ? ?Primary Care Provider:  No primary care provider on file. ? ?*High risk score on COVID airborn precaution ? ?Reviewed for post hospital transition and follow up on no PCP and found per inpatient TOC LCSW that the patient is LTC at [facility]. ? ?No [facility] follow up needs as patient is LTC SNF for transition. ? ?Natividad Brood, RN BSN CCM ?[facility] Liaison ? [phone] business mobile phone ?Toll free office [phone]  ?Fax number: [phone] ?[email or web address] ? ? ? ?

## 2021-05-13 LAB — GLUCOSE, CAPILLARY
Glucose-Capillary: 135 mg/dL — ABNORMAL HIGH (ref 70–99)
Glucose-Capillary: 146 mg/dL — ABNORMAL HIGH (ref 70–99)

## 2021-05-13 NOTE — Plan of Care (Signed)

## 2021-05-13 NOTE — TOC Transition Note (Signed)
Transition of Care (TOC) - CM/SW Discharge Note ? ? ?Patient Details  ?Name: Jorge [name] ?MRN: [id number] ?Date of Birth: [birth year] ? ?Transition of Care (TOC) CM/SW Contact:  ?Amador Cunas, LCSW ?Phone Number: ?05/13/2021, 10:43 AM ? ? ?Clinical Narrative:   Pt for dc back to [facility] where he is a LTC resident. Spoke to [place] at [facility] who confirmed they are prepared to admit pt to room 108 today. Packet complete and RN provided with number for report. Pt aware of dc and reports agreeable. SW signing off at dc.  ? ?Wandra Feinstein, MSW, LCSW ?[phone] (coverage) ? ? ? ? ? ?Final next level of care: [place] ?Barriers to Discharge: No Barriers Identified ? ? ?Patient Goals and CMS Choice ?  ?  ?  ? ?Discharge Placement ?  ?           ?Patient chooses bed at:  [facility]/LTC) ?Patient to be transferred to facility by: [facility] ?  ?Patient and family notified of of transfer: 05/13/21 ? ?Discharge Plan and Services ?  ?  ?           ?  ?  ?  ?  ?  ?  ?  ?  ?  ?  ? ?Social Determinants of Health (SDOH) Interventions ?  ? ? ?Readmission Risk Interventions ?   ? View : No data to display.  ?  ?  ?  ? ? ? ? ? ?

## 2021-05-13 NOTE — Progress Notes (Signed)
?[facility] ?Progress Note  ? ?Subjective:    ?Seen and examined patient at bedside. HD completed yesterday. He reports UF was held most treatment d/t low BPs. Denies SOB, CP, and N/V. Plan for patient to be discharged today back to his facility. ? ?Objective ?Vitals:  ? 05/13/21 0200 05/13/21 0230 05/13/21 0447 05/13/21 0811  ?BP: 105/61 109/82 125/70 (!) 144/76  ?Pulse: 77 77 72 86  ?Resp:  18 20 18   ?Temp:   (!) 97.3 ?F (36.3 ?C) 97.9 ?F (36.6 ?C)  ?TempSrc:    Oral  ?SpO2:   100% 100%  ?Weight:  68 kg    ?Height:      ? ?Physical Exam ?General: Alert, sitting up in bed, NAD ?Heart: RRR ?Lungs: Clear bilaterally  ?Abdomen: Soft  ?Extremities: Bilat BKA No edema  ?Dialysis Access: R AVF ? ?Filed Weights  ? 05/12/21 0500 05/12/21 2157 05/13/21 0230  ?Weight: 72.5 kg 69.1 kg 68 kg  ? ? ?Intake/Output Summary (Last 24 hours) at 05/13/2021 1112 ?Last data filed at 05/13/2021 0900 ?Gross per 24 hour  ?Intake 960 ml  ?Output 563 ml  ?Net 397 ml  ? ? ?Additional Objective ?Labs: ?Basic Metabolic Panel: ?Recent Labs  ?Lab 05/09/21 ?1443 05/10/21 ?2330 05/11/21 ?0762  ?NA 141 137 138  ?K 4.7 3.9 3.5  ?CL 102 98 97*  ?CO2 25 24 29   ?GLUCOSE 129* 78 166*  ?BUN 34* 41* 23*  ?CREATININE 8.13* 9.34* 6.59*  ?CALCIUM 8.7* 8.5* 8.5*  ?PHOS 4.7* 4.2  --   ? ?Liver Function Tests: ?Recent Labs  ?Lab 05/08/21 ?1800 05/09/21 ?1443 05/10/21 ?2633 05/11/21 ?3545  ?AST 25 33  --  27  ?ALT 12 15  --  15  ?ALKPHOS 120 101  --  104  ?BILITOT 1.0 0.7  --  0.8  ?PROT 8.3* 6.9  --  6.9  ?ALBUMIN 4.0 3.2* 3.1* 3.1*  ? ?Recent Labs  ?Lab 05/08/21 ?1735  ?LIPASE 21  ? ?CBC: ?Recent Labs  ?Lab 05/08/21 ?1800 05/09/21 ?1443 05/10/21 ?6256 05/11/21 ?3893  ?WBC 7.1 4.7 4.2 4.0  ?NEUTROABS 4.6 2.2  --  1.0*  ?HGB 9.6* 9.3* 8.6* 9.3*  ?HCT 30.7* 29.6* 27.9* 29.7*  ?MCV 99.7 98.3 99.3 98.0  ?PLT 113* PLATELET CLUMPS NOTED ON SMEAR, UNABLE TO ESTIMATE 96* 94*  ? ?Blood Culture ?   ?Component Value Date/Time  ? SDES BLOOD SITE NOT SPECIFIED  05/09/2021 2224  ? SDES BLOOD SITE NOT SPECIFIED 05/09/2021 2224  ? SPECREQUEST  05/09/2021 2224  ?  BOTTLES DRAWN AEROBIC AND ANAEROBIC Blood Culture adequate volume  ? SPECREQUEST  05/09/2021 2224  ?  BOTTLES DRAWN AEROBIC ONLY Blood Culture results may not be optimal due to an inadequate volume of blood received in culture bottles  ? CULT  05/09/2021 2224  ?  NO GROWTH 4 DAYS ?Performed at [facility], [place] [address], [place], [place] [address] ?  ? CULT  05/09/2021 2224  ?  NO GROWTH 4 DAYS ?Performed at [facility], [place] [address], [place], [place] [address] ?  ? REPTSTATUS PENDING 05/09/2021 2224  ? REPTSTATUS PENDING 05/09/2021 2224  ? ? ?Cardiac Enzymes: ?No results for input(s): CKTOTAL, CKMB, CKMBINDEX, TROPONINI in the last 168 hours. ?CBG: ?Recent Labs  ?Lab 05/12/21 ?0741 05/12/21 ?1154 05/12/21 ?1706 05/12/21 ?2110 05/13/21 ?0745  ?GLUCAP 135* 113* 214* 95 135*  ? ?Iron Studies: No results for input(s): IRON, TIBC, TRANSFERRIN, FERRITIN in the last 72 hours. ?Lab  Results  ?Component Value Date  ? INR 1.5 (H) 05/08/2021  ? INR 1.1 05/09/2020  ? INR 1.1 12/15/2019  ? ?Studies/Results: ?No results found. ? ?Medications: ? ? amLODipine  10 mg Oral Daily  ? atorvastatin  40 mg Oral Daily  ? Chlorhexidine Gluconate Cloth  6 each Topical Q0600  ? clopidogrel  75 mg Oral Daily  ? folic acid  1 mg Oral Daily  ? heparin  5,000 Units Subcutaneous Q8H  ? insulin aspart  0-6 Units Subcutaneous TID WC  ? molnupiravir EUA  4 capsule Oral BID  ? sevelamer carbonate  1,600 mg Oral 2 times per day  ? sevelamer carbonate  3,200 mg Oral 3 times per day  ? sodium chloride  1-2 spray Each Nare Q2H  ? ? ?Dialysis Orders: ?[facility] ? 4h  2/2.5 bath  400/1.5  72.5kg  P2  Hep none  RUA AVF ? - mircera 75 q4, last 4/24 ? - venofer 50 q wk on friday ? - hectorol 4 ug tiw IV ? - last HD 5/01 82.3 > 79.7kg ? - last Hb 9.8 on 4/26 ? ?Assessment/Plan: ?COVID 19 infection - w/ high fevers and hypoxemia on presentation.  Started on molnupiravir per pmd.    ?Acute hypoxic resp failure - per pmd. CXR neg, no signs of vol overload. Remains on room air. ?AMS - improving.  ?ESRD - on HD MWF. Patient to resume HD on Monday 05/15/21 in outpatient HD center. ?Volume- BP dropped during yesterday's HD, appears to be stablized now. Remains euvolemic on exam. ?DM2 - on insulin ?PAD sp bilat BKA ?H/o CVA ?Anemia esrd - Hb 9s here, esa given 4/24 at OP HD. Follow.  ?MBD ckd - CCa and phos in range. Cont binder, vdra. ?  ?Tobie Poet, NP ?[facility] ?05/13/2021,11:12 AM ? LOS: 4 days  ?  ?

## 2021-05-13 NOTE — Progress Notes (Signed)
Patient seen and examined.  He has no complaints.  Patient was supposed to be discharged after dialysis yesterday but since the dialysis was third shift due to him being COVID-positive, it was too late for him to be discharged so he ended up staying overnight.  There is no change clinically from yesterday.  He is stable and he will be discharged today. ?

## 2021-05-13 NOTE — Progress Notes (Signed)
Nsg Discharge Note ? ?Admit Date:  05/08/2021 ?Discharge date: 05/13/2021 ?  ?Blane Tollie Eth to be D/C'd Skilled nursing facility per MD order.  AVS completed. Patient/caregiver able to verbalize understanding. ? ?Discharge Medication: ?Allergies as of 05/13/2021   ? ?   Reactions  ? Lactose Intolerance (gi) Diarrhea, Nausea Only  ? ?  ? ?  ?Medication List  ?  ? ?STOP taking these medications   ? ?LORazepam 0.5 MG tablet ?Commonly known as: ATIVAN ?  ?multivitamin Tabs tablet ?  ? ?  ? ?TAKE these medications   ? ?amLODipine 10 MG tablet ?Commonly known as: NORVASC ?Take 1 tablet (10 mg total) by mouth daily. ?  ?Artificial Tears 0.2-0.2-1 % Soln ?Generic drug: Glycerin-Hypromellose-PEG 400 ?Place 1 drop into both eyes 4 (four) times daily. ?  ?atorvastatin 40 MG tablet ?Commonly known as: LIPITOR ?Take 1 tablet (40 mg total) by mouth daily. ?  ?clopidogrel 75 MG tablet ?Commonly known as: PLAVIX ?Take 1 tablet (75 mg total) by mouth daily. ?  ?diclofenac Sodium 1 % Gel ?Commonly known as: VOLTAREN ?Apply 2 g topically See admin instructions. Apply 2 grams of gel to the left hand three times a day ?  ?Ensure Clear Liqd ?Take 240 mLs by mouth See admin instructions. Mix 240 ml's with sugar substitute and drink every morning ?  ?furosemide 20 MG tablet ?Commonly known as: LASIX ?Take 20 mg by mouth in the morning and at bedtime. ?  ?gabapentin 300 MG capsule ?Commonly known as: NEURONTIN ?Take 300 mg by mouth 3 (three) times daily. ?  ?glucagon 1 MG Solr injection ?Commonly known as: GLUCAGEN ?Inject 1 mg into the muscle as needed for low blood sugar. ?  ?HumaLOG KwikPen 100 UNIT/ML KwikPen ?Generic drug: insulin lispro ?Inject 0-10 Units into the skin See admin instructions. Inject 0-10 units into the skin three times a day with meals, per sliding scale: BGL 0-150 = give nothing; 151-200 = 2 units; 201-250 = 4 units; 251-300 = 6 units; 301-350 = 8 units; 351-400 = 10 units; call PCP if <70 or >400 ?  ?Imodium A-D 2 MG  tablet ?Generic drug: loperamide ?Take 2-4 mg by mouth See admin instructions. Take 4 mg as an initial dose, then decrease to 2 mg after each unformed stool- not to exceed 24 mg in 48 hours and call MD if diarrhea persists greater than 48 hours ?  ?Lantus SoloStar 100 UNIT/ML Solostar Pen ?Generic drug: insulin glargine ?Inject 5 Units into the skin at bedtime. ?  ?multivitamin tablet ?Take 1 tablet by mouth at bedtime. ?  ?OXYGEN ?Inhale 2 L/min into the lungs as needed (if sats drop below 92% during episodes of anxiety). ?  ?Preparation H 1-0.25-14.4-15 % Crea ?Generic drug: Pramox-PE-Glycerin-Petrolatum ?Apply 1 application. topically every 6 (six) hours as needed (for hemorrhoids). ?  ?promethazine 25 MG tablet ?Commonly known as: PHENERGAN ?Take 25 mg by mouth every 6 (six) hours as needed for nausea or vomiting. ?  ?ProRenal + D Tabs ?Take 1,000 Units by mouth in the morning. ?  ?Refresh Lacri-Lube Oint ?Place into both eyes See admin instructions. Place 0.25 ribbon into each eye at bedtime ?  ?Refresh Liquigel 1 % Gel ?Generic drug: Carboxymethylcellulose Sodium ?Place 1 drop into both eyes at bedtime. ?  ?sevelamer carbonate 800 MG tablet ?Commonly known as: RENVELA ?Take 1,600-3,200 mg by mouth 2 (two) times daily. Take 1,600 mg by mouth two times a day (at 2 PM and 9 PM) and 3,200  mg three times a day "for dialysis" (at 7:30 AM, 12 NOON, and 5 PM) ?  ?Vitamin D (Ergocalciferol) 1.25 MG (50000 UNIT) Caps capsule ?Commonly known as: DRISDOL ?Take 50,000 Units by mouth every Saturday. ?  ? ?  ? ? ?Discharge Assessment: ?Vitals:  ? 05/13/21 0447 05/13/21 0811  ?BP: 125/70 (!) 144/76  ?Pulse: 72 86  ?Resp: 20 18  ?Temp: (!) 97.3 ?F (36.3 ?C) 97.9 ?F (36.6 ?C)  ?SpO2: 100% 100%  ? Skin clean, dry and intact without evidence of skin break down, no evidence of skin tears noted. ?IV catheter discontinued intact. Site without signs and symptoms of complications - no redness or edema noted at insertion site, patient  denies c/o pain - only slight tenderness at site.  Dressing with slight pressure applied. ? ?D/c Instructions-Education: ?Discharge instructions given to patient/family with verbalized understanding. ?D/c education completed with patient/family including follow up instructions, medication list, d/c activities limitations if indicated, with other d/c instructions as indicated by MD - patient able to verbalize understanding, all questions fully answered. ?Patient instructed to return to ED, call 911, or call MD for any changes in condition.  ?Patient escorted via [place], and D/C to [facility] via [facility]tar. ? ?Atilano Ina, RN ?05/13/2021 9:42 AM  ?

## 2021-05-14 DIAGNOSIS — Z992 Dependence on renal dialysis: Secondary | ICD-10-CM | POA: Diagnosis not present

## 2021-05-14 DIAGNOSIS — E1122 Type 2 diabetes mellitus with diabetic chronic kidney disease: Secondary | ICD-10-CM | POA: Diagnosis not present

## 2021-05-14 DIAGNOSIS — N186 End stage renal disease: Secondary | ICD-10-CM | POA: Diagnosis not present

## 2021-05-14 LAB — CULTURE, BLOOD (ROUTINE X 2)
Culture: NO GROWTH
Culture: NO GROWTH
Special Requests: ADEQUATE

## 2021-05-15 DIAGNOSIS — N186 End stage renal disease: Secondary | ICD-10-CM | POA: Diagnosis not present

## 2021-05-15 DIAGNOSIS — N2581 Secondary hyperparathyroidism of renal origin: Secondary | ICD-10-CM | POA: Diagnosis not present

## 2021-05-15 DIAGNOSIS — L299 Pruritus, unspecified: Secondary | ICD-10-CM | POA: Diagnosis not present

## 2021-05-15 DIAGNOSIS — Z992 Dependence on renal dialysis: Secondary | ICD-10-CM | POA: Diagnosis not present

## 2021-05-15 DIAGNOSIS — R197 Diarrhea, unspecified: Secondary | ICD-10-CM | POA: Diagnosis not present

## 2021-05-15 DIAGNOSIS — D509 Iron deficiency anemia, unspecified: Secondary | ICD-10-CM | POA: Diagnosis not present

## 2021-05-17 DIAGNOSIS — R197 Diarrhea, unspecified: Secondary | ICD-10-CM | POA: Diagnosis not present

## 2021-05-17 DIAGNOSIS — L299 Pruritus, unspecified: Secondary | ICD-10-CM | POA: Diagnosis not present

## 2021-05-17 DIAGNOSIS — N186 End stage renal disease: Secondary | ICD-10-CM | POA: Diagnosis not present

## 2021-05-17 DIAGNOSIS — Z992 Dependence on renal dialysis: Secondary | ICD-10-CM | POA: Diagnosis not present

## 2021-05-17 DIAGNOSIS — N2581 Secondary hyperparathyroidism of renal origin: Secondary | ICD-10-CM | POA: Diagnosis not present

## 2021-05-17 DIAGNOSIS — D509 Iron deficiency anemia, unspecified: Secondary | ICD-10-CM | POA: Diagnosis not present

## 2021-05-17 DIAGNOSIS — U071 COVID-19: Secondary | ICD-10-CM | POA: Diagnosis not present

## 2021-05-19 DIAGNOSIS — Z992 Dependence on renal dialysis: Secondary | ICD-10-CM | POA: Diagnosis not present

## 2021-05-19 DIAGNOSIS — D509 Iron deficiency anemia, unspecified: Secondary | ICD-10-CM | POA: Diagnosis not present

## 2021-05-19 DIAGNOSIS — N2581 Secondary hyperparathyroidism of renal origin: Secondary | ICD-10-CM | POA: Diagnosis not present

## 2021-05-19 DIAGNOSIS — L299 Pruritus, unspecified: Secondary | ICD-10-CM | POA: Diagnosis not present

## 2021-05-19 DIAGNOSIS — R197 Diarrhea, unspecified: Secondary | ICD-10-CM | POA: Diagnosis not present

## 2021-05-19 DIAGNOSIS — N186 End stage renal disease: Secondary | ICD-10-CM | POA: Diagnosis not present

## 2021-05-22 DIAGNOSIS — N186 End stage renal disease: Secondary | ICD-10-CM | POA: Diagnosis not present

## 2021-05-22 DIAGNOSIS — Z992 Dependence on renal dialysis: Secondary | ICD-10-CM | POA: Diagnosis not present

## 2021-05-22 DIAGNOSIS — D509 Iron deficiency anemia, unspecified: Secondary | ICD-10-CM | POA: Diagnosis not present

## 2021-05-22 DIAGNOSIS — I10 Essential (primary) hypertension: Secondary | ICD-10-CM | POA: Diagnosis not present

## 2021-05-22 DIAGNOSIS — E1122 Type 2 diabetes mellitus with diabetic chronic kidney disease: Secondary | ICD-10-CM | POA: Diagnosis not present

## 2021-05-22 DIAGNOSIS — R197 Diarrhea, unspecified: Secondary | ICD-10-CM | POA: Diagnosis not present

## 2021-05-22 DIAGNOSIS — N2581 Secondary hyperparathyroidism of renal origin: Secondary | ICD-10-CM | POA: Diagnosis not present

## 2021-05-22 DIAGNOSIS — L299 Pruritus, unspecified: Secondary | ICD-10-CM | POA: Diagnosis not present

## 2021-05-24 DIAGNOSIS — R197 Diarrhea, unspecified: Secondary | ICD-10-CM | POA: Diagnosis not present

## 2021-05-24 DIAGNOSIS — N2581 Secondary hyperparathyroidism of renal origin: Secondary | ICD-10-CM | POA: Diagnosis not present

## 2021-05-24 DIAGNOSIS — N186 End stage renal disease: Secondary | ICD-10-CM | POA: Diagnosis not present

## 2021-05-24 DIAGNOSIS — Z992 Dependence on renal dialysis: Secondary | ICD-10-CM | POA: Diagnosis not present

## 2021-05-24 DIAGNOSIS — L299 Pruritus, unspecified: Secondary | ICD-10-CM | POA: Diagnosis not present

## 2021-05-24 DIAGNOSIS — D509 Iron deficiency anemia, unspecified: Secondary | ICD-10-CM | POA: Diagnosis not present

## 2021-05-25 ENCOUNTER — Encounter (INDEPENDENT_AMBULATORY_CARE_PROVIDER_SITE_OTHER): Payer: Medicare Other | Admitting: Ophthalmology

## 2021-05-26 DIAGNOSIS — N186 End stage renal disease: Secondary | ICD-10-CM | POA: Diagnosis not present

## 2021-05-26 DIAGNOSIS — R197 Diarrhea, unspecified: Secondary | ICD-10-CM | POA: Diagnosis not present

## 2021-05-26 DIAGNOSIS — N2581 Secondary hyperparathyroidism of renal origin: Secondary | ICD-10-CM | POA: Diagnosis not present

## 2021-05-26 DIAGNOSIS — Z992 Dependence on renal dialysis: Secondary | ICD-10-CM | POA: Diagnosis not present

## 2021-05-26 DIAGNOSIS — L299 Pruritus, unspecified: Secondary | ICD-10-CM | POA: Diagnosis not present

## 2021-05-26 DIAGNOSIS — D509 Iron deficiency anemia, unspecified: Secondary | ICD-10-CM | POA: Diagnosis not present

## 2021-05-29 DIAGNOSIS — N2581 Secondary hyperparathyroidism of renal origin: Secondary | ICD-10-CM | POA: Diagnosis not present

## 2021-05-29 DIAGNOSIS — N186 End stage renal disease: Secondary | ICD-10-CM | POA: Diagnosis not present

## 2021-05-29 DIAGNOSIS — R197 Diarrhea, unspecified: Secondary | ICD-10-CM | POA: Diagnosis not present

## 2021-05-29 DIAGNOSIS — L299 Pruritus, unspecified: Secondary | ICD-10-CM | POA: Diagnosis not present

## 2021-05-29 DIAGNOSIS — D509 Iron deficiency anemia, unspecified: Secondary | ICD-10-CM | POA: Diagnosis not present

## 2021-05-29 DIAGNOSIS — Z992 Dependence on renal dialysis: Secondary | ICD-10-CM | POA: Diagnosis not present

## 2021-05-31 DIAGNOSIS — N2581 Secondary hyperparathyroidism of renal origin: Secondary | ICD-10-CM | POA: Diagnosis not present

## 2021-05-31 DIAGNOSIS — R197 Diarrhea, unspecified: Secondary | ICD-10-CM | POA: Diagnosis not present

## 2021-05-31 DIAGNOSIS — D509 Iron deficiency anemia, unspecified: Secondary | ICD-10-CM | POA: Diagnosis not present

## 2021-05-31 DIAGNOSIS — Z992 Dependence on renal dialysis: Secondary | ICD-10-CM | POA: Diagnosis not present

## 2021-05-31 DIAGNOSIS — L299 Pruritus, unspecified: Secondary | ICD-10-CM | POA: Diagnosis not present

## 2021-05-31 DIAGNOSIS — N186 End stage renal disease: Secondary | ICD-10-CM | POA: Diagnosis not present

## 2021-06-01 ENCOUNTER — Ambulatory Visit: Payer: Medicare Other | Admitting: Orthopedic Surgery

## 2021-06-02 DIAGNOSIS — Z992 Dependence on renal dialysis: Secondary | ICD-10-CM | POA: Diagnosis not present

## 2021-06-02 DIAGNOSIS — L299 Pruritus, unspecified: Secondary | ICD-10-CM | POA: Diagnosis not present

## 2021-06-02 DIAGNOSIS — R197 Diarrhea, unspecified: Secondary | ICD-10-CM | POA: Diagnosis not present

## 2021-06-02 DIAGNOSIS — D509 Iron deficiency anemia, unspecified: Secondary | ICD-10-CM | POA: Diagnosis not present

## 2021-06-02 DIAGNOSIS — N186 End stage renal disease: Secondary | ICD-10-CM | POA: Diagnosis not present

## 2021-06-02 DIAGNOSIS — N2581 Secondary hyperparathyroidism of renal origin: Secondary | ICD-10-CM | POA: Diagnosis not present

## 2021-06-05 DIAGNOSIS — N186 End stage renal disease: Secondary | ICD-10-CM | POA: Diagnosis not present

## 2021-06-05 DIAGNOSIS — L299 Pruritus, unspecified: Secondary | ICD-10-CM | POA: Diagnosis not present

## 2021-06-05 DIAGNOSIS — R197 Diarrhea, unspecified: Secondary | ICD-10-CM | POA: Diagnosis not present

## 2021-06-05 DIAGNOSIS — Z992 Dependence on renal dialysis: Secondary | ICD-10-CM | POA: Diagnosis not present

## 2021-06-05 DIAGNOSIS — D509 Iron deficiency anemia, unspecified: Secondary | ICD-10-CM | POA: Diagnosis not present

## 2021-06-05 DIAGNOSIS — N2581 Secondary hyperparathyroidism of renal origin: Secondary | ICD-10-CM | POA: Diagnosis not present

## 2021-06-07 DIAGNOSIS — R197 Diarrhea, unspecified: Secondary | ICD-10-CM | POA: Diagnosis not present

## 2021-06-07 DIAGNOSIS — Z992 Dependence on renal dialysis: Secondary | ICD-10-CM | POA: Diagnosis not present

## 2021-06-07 DIAGNOSIS — D509 Iron deficiency anemia, unspecified: Secondary | ICD-10-CM | POA: Diagnosis not present

## 2021-06-07 DIAGNOSIS — N2581 Secondary hyperparathyroidism of renal origin: Secondary | ICD-10-CM | POA: Diagnosis not present

## 2021-06-07 DIAGNOSIS — L299 Pruritus, unspecified: Secondary | ICD-10-CM | POA: Diagnosis not present

## 2021-06-07 DIAGNOSIS — N186 End stage renal disease: Secondary | ICD-10-CM | POA: Diagnosis not present

## 2021-06-08 DIAGNOSIS — I10 Essential (primary) hypertension: Secondary | ICD-10-CM | POA: Diagnosis not present

## 2021-06-08 DIAGNOSIS — E1122 Type 2 diabetes mellitus with diabetic chronic kidney disease: Secondary | ICD-10-CM | POA: Diagnosis not present

## 2021-06-09 DIAGNOSIS — E1122 Type 2 diabetes mellitus with diabetic chronic kidney disease: Secondary | ICD-10-CM | POA: Diagnosis not present

## 2021-06-09 DIAGNOSIS — Z992 Dependence on renal dialysis: Secondary | ICD-10-CM | POA: Diagnosis not present

## 2021-06-09 DIAGNOSIS — N2581 Secondary hyperparathyroidism of renal origin: Secondary | ICD-10-CM | POA: Diagnosis not present

## 2021-06-09 DIAGNOSIS — R197 Diarrhea, unspecified: Secondary | ICD-10-CM | POA: Diagnosis not present

## 2021-06-09 DIAGNOSIS — N186 End stage renal disease: Secondary | ICD-10-CM | POA: Diagnosis not present

## 2021-06-12 DIAGNOSIS — R197 Diarrhea, unspecified: Secondary | ICD-10-CM | POA: Diagnosis not present

## 2021-06-12 DIAGNOSIS — E1122 Type 2 diabetes mellitus with diabetic chronic kidney disease: Secondary | ICD-10-CM | POA: Diagnosis not present

## 2021-06-12 DIAGNOSIS — N186 End stage renal disease: Secondary | ICD-10-CM | POA: Diagnosis not present

## 2021-06-12 DIAGNOSIS — N2581 Secondary hyperparathyroidism of renal origin: Secondary | ICD-10-CM | POA: Diagnosis not present

## 2021-06-12 DIAGNOSIS — Z992 Dependence on renal dialysis: Secondary | ICD-10-CM | POA: Diagnosis not present

## 2021-06-13 DIAGNOSIS — M6250 Muscle wasting and atrophy, not elsewhere classified, unspecified site: Secondary | ICD-10-CM | POA: Diagnosis not present

## 2021-06-13 DIAGNOSIS — U071 COVID-19: Secondary | ICD-10-CM | POA: Diagnosis not present

## 2021-06-13 DIAGNOSIS — Z992 Dependence on renal dialysis: Secondary | ICD-10-CM | POA: Diagnosis not present

## 2021-06-14 DIAGNOSIS — Z992 Dependence on renal dialysis: Secondary | ICD-10-CM | POA: Diagnosis not present

## 2021-06-14 DIAGNOSIS — R197 Diarrhea, unspecified: Secondary | ICD-10-CM | POA: Diagnosis not present

## 2021-06-14 DIAGNOSIS — E1122 Type 2 diabetes mellitus with diabetic chronic kidney disease: Secondary | ICD-10-CM | POA: Diagnosis not present

## 2021-06-14 DIAGNOSIS — U071 COVID-19: Secondary | ICD-10-CM | POA: Diagnosis not present

## 2021-06-14 DIAGNOSIS — M6250 Muscle wasting and atrophy, not elsewhere classified, unspecified site: Secondary | ICD-10-CM | POA: Diagnosis not present

## 2021-06-14 DIAGNOSIS — N186 End stage renal disease: Secondary | ICD-10-CM | POA: Diagnosis not present

## 2021-06-14 DIAGNOSIS — N2581 Secondary hyperparathyroidism of renal origin: Secondary | ICD-10-CM | POA: Diagnosis not present

## 2021-06-15 ENCOUNTER — Ambulatory Visit (INDEPENDENT_AMBULATORY_CARE_PROVIDER_SITE_OTHER): Payer: Medicare Other | Admitting: Orthopedic Surgery

## 2021-06-15 DIAGNOSIS — M6250 Muscle wasting and atrophy, not elsewhere classified, unspecified site: Secondary | ICD-10-CM | POA: Diagnosis not present

## 2021-06-15 DIAGNOSIS — Z89512 Acquired absence of left leg below knee: Secondary | ICD-10-CM | POA: Diagnosis not present

## 2021-06-15 DIAGNOSIS — U071 COVID-19: Secondary | ICD-10-CM | POA: Diagnosis not present

## 2021-06-15 DIAGNOSIS — Z89511 Acquired absence of right leg below knee: Secondary | ICD-10-CM

## 2021-06-15 DIAGNOSIS — Z992 Dependence on renal dialysis: Secondary | ICD-10-CM | POA: Diagnosis not present

## 2021-06-16 DIAGNOSIS — D631 Anemia in chronic kidney disease: Secondary | ICD-10-CM | POA: Diagnosis not present

## 2021-06-16 DIAGNOSIS — Z89519 Acquired absence of unspecified leg below knee: Secondary | ICD-10-CM | POA: Diagnosis not present

## 2021-06-16 DIAGNOSIS — Z992 Dependence on renal dialysis: Secondary | ICD-10-CM | POA: Diagnosis not present

## 2021-06-16 DIAGNOSIS — R197 Diarrhea, unspecified: Secondary | ICD-10-CM | POA: Diagnosis not present

## 2021-06-16 DIAGNOSIS — I10 Essential (primary) hypertension: Secondary | ICD-10-CM | POA: Diagnosis not present

## 2021-06-16 DIAGNOSIS — U071 COVID-19: Secondary | ICD-10-CM | POA: Diagnosis not present

## 2021-06-16 DIAGNOSIS — G9340 Encephalopathy, unspecified: Secondary | ICD-10-CM | POA: Diagnosis not present

## 2021-06-16 DIAGNOSIS — N186 End stage renal disease: Secondary | ICD-10-CM | POA: Diagnosis not present

## 2021-06-16 DIAGNOSIS — E1122 Type 2 diabetes mellitus with diabetic chronic kidney disease: Secondary | ICD-10-CM | POA: Diagnosis not present

## 2021-06-16 DIAGNOSIS — M6250 Muscle wasting and atrophy, not elsewhere classified, unspecified site: Secondary | ICD-10-CM | POA: Diagnosis not present

## 2021-06-16 DIAGNOSIS — N2581 Secondary hyperparathyroidism of renal origin: Secondary | ICD-10-CM | POA: Diagnosis not present

## 2021-06-19 DIAGNOSIS — N2581 Secondary hyperparathyroidism of renal origin: Secondary | ICD-10-CM | POA: Diagnosis not present

## 2021-06-19 DIAGNOSIS — U071 COVID-19: Secondary | ICD-10-CM | POA: Diagnosis not present

## 2021-06-19 DIAGNOSIS — M6250 Muscle wasting and atrophy, not elsewhere classified, unspecified site: Secondary | ICD-10-CM | POA: Diagnosis not present

## 2021-06-19 DIAGNOSIS — E1122 Type 2 diabetes mellitus with diabetic chronic kidney disease: Secondary | ICD-10-CM | POA: Diagnosis not present

## 2021-06-19 DIAGNOSIS — R197 Diarrhea, unspecified: Secondary | ICD-10-CM | POA: Diagnosis not present

## 2021-06-19 DIAGNOSIS — Z992 Dependence on renal dialysis: Secondary | ICD-10-CM | POA: Diagnosis not present

## 2021-06-19 DIAGNOSIS — N186 End stage renal disease: Secondary | ICD-10-CM | POA: Diagnosis not present

## 2021-06-20 DIAGNOSIS — U071 COVID-19: Secondary | ICD-10-CM | POA: Diagnosis not present

## 2021-06-20 DIAGNOSIS — M6250 Muscle wasting and atrophy, not elsewhere classified, unspecified site: Secondary | ICD-10-CM | POA: Diagnosis not present

## 2021-06-20 DIAGNOSIS — Z992 Dependence on renal dialysis: Secondary | ICD-10-CM | POA: Diagnosis not present

## 2021-06-21 DIAGNOSIS — E1122 Type 2 diabetes mellitus with diabetic chronic kidney disease: Secondary | ICD-10-CM | POA: Diagnosis not present

## 2021-06-21 DIAGNOSIS — N2581 Secondary hyperparathyroidism of renal origin: Secondary | ICD-10-CM | POA: Diagnosis not present

## 2021-06-21 DIAGNOSIS — N186 End stage renal disease: Secondary | ICD-10-CM | POA: Diagnosis not present

## 2021-06-21 DIAGNOSIS — Z992 Dependence on renal dialysis: Secondary | ICD-10-CM | POA: Diagnosis not present

## 2021-06-21 DIAGNOSIS — U071 COVID-19: Secondary | ICD-10-CM | POA: Diagnosis not present

## 2021-06-21 DIAGNOSIS — R197 Diarrhea, unspecified: Secondary | ICD-10-CM | POA: Diagnosis not present

## 2021-06-21 DIAGNOSIS — M6250 Muscle wasting and atrophy, not elsewhere classified, unspecified site: Secondary | ICD-10-CM | POA: Diagnosis not present

## 2021-06-23 DIAGNOSIS — N186 End stage renal disease: Secondary | ICD-10-CM | POA: Diagnosis not present

## 2021-06-23 DIAGNOSIS — U071 COVID-19: Secondary | ICD-10-CM | POA: Diagnosis not present

## 2021-06-23 DIAGNOSIS — Z992 Dependence on renal dialysis: Secondary | ICD-10-CM | POA: Diagnosis not present

## 2021-06-23 DIAGNOSIS — R197 Diarrhea, unspecified: Secondary | ICD-10-CM | POA: Diagnosis not present

## 2021-06-23 DIAGNOSIS — M6250 Muscle wasting and atrophy, not elsewhere classified, unspecified site: Secondary | ICD-10-CM | POA: Diagnosis not present

## 2021-06-23 DIAGNOSIS — E1122 Type 2 diabetes mellitus with diabetic chronic kidney disease: Secondary | ICD-10-CM | POA: Diagnosis not present

## 2021-06-23 DIAGNOSIS — N2581 Secondary hyperparathyroidism of renal origin: Secondary | ICD-10-CM | POA: Diagnosis not present

## 2021-06-24 ENCOUNTER — Encounter: Payer: Self-pay | Admitting: Orthopedic Surgery
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
## 2021-06-26 DIAGNOSIS — M6250 Muscle wasting and atrophy, not elsewhere classified, unspecified site: Secondary | ICD-10-CM | POA: Diagnosis not present

## 2021-06-26 DIAGNOSIS — Z992 Dependence on renal dialysis: Secondary | ICD-10-CM | POA: Diagnosis not present

## 2021-06-26 DIAGNOSIS — R197 Diarrhea, unspecified: Secondary | ICD-10-CM | POA: Diagnosis not present

## 2021-06-26 DIAGNOSIS — E1122 Type 2 diabetes mellitus with diabetic chronic kidney disease: Secondary | ICD-10-CM | POA: Diagnosis not present

## 2021-06-26 DIAGNOSIS — N2581 Secondary hyperparathyroidism of renal origin: Secondary | ICD-10-CM | POA: Diagnosis not present

## 2021-06-26 DIAGNOSIS — N186 End stage renal disease: Secondary | ICD-10-CM | POA: Diagnosis not present

## 2021-06-26 DIAGNOSIS — U071 COVID-19: Secondary | ICD-10-CM | POA: Diagnosis not present

## 2021-06-27 ENCOUNTER — Encounter: Payer: Medicare Other | Admitting: Nurse Practitioner

## 2021-06-27 DIAGNOSIS — M6250 Muscle wasting and atrophy, not elsewhere classified, unspecified site: Secondary | ICD-10-CM | POA: Diagnosis not present

## 2021-06-27 DIAGNOSIS — Z992 Dependence on renal dialysis: Secondary | ICD-10-CM | POA: Diagnosis not present

## 2021-06-27 DIAGNOSIS — U071 COVID-19: Secondary | ICD-10-CM | POA: Diagnosis not present

## 2021-06-28 DIAGNOSIS — U071 COVID-19: Secondary | ICD-10-CM | POA: Diagnosis not present

## 2021-06-28 DIAGNOSIS — E1122 Type 2 diabetes mellitus with diabetic chronic kidney disease: Secondary | ICD-10-CM | POA: Diagnosis not present

## 2021-06-28 DIAGNOSIS — N186 End stage renal disease: Secondary | ICD-10-CM | POA: Diagnosis not present

## 2021-06-28 DIAGNOSIS — M6250 Muscle wasting and atrophy, not elsewhere classified, unspecified site: Secondary | ICD-10-CM | POA: Diagnosis not present

## 2021-06-28 DIAGNOSIS — N2581 Secondary hyperparathyroidism of renal origin: Secondary | ICD-10-CM | POA: Diagnosis not present

## 2021-06-28 DIAGNOSIS — Z992 Dependence on renal dialysis: Secondary | ICD-10-CM | POA: Diagnosis not present

## 2021-06-28 DIAGNOSIS — R197 Diarrhea, unspecified: Secondary | ICD-10-CM | POA: Diagnosis not present

## 2021-06-29 DIAGNOSIS — U071 COVID-19: Secondary | ICD-10-CM | POA: Diagnosis not present

## 2021-06-29 DIAGNOSIS — M6250 Muscle wasting and atrophy, not elsewhere classified, unspecified site: Secondary | ICD-10-CM | POA: Diagnosis not present

## 2021-06-29 DIAGNOSIS — Z992 Dependence on renal dialysis: Secondary | ICD-10-CM | POA: Diagnosis not present

## 2021-06-29 DIAGNOSIS — N186 End stage renal disease: Secondary | ICD-10-CM | POA: Diagnosis not present

## 2021-06-29 DIAGNOSIS — E1122 Type 2 diabetes mellitus with diabetic chronic kidney disease: Secondary | ICD-10-CM | POA: Diagnosis not present

## 2021-06-30 DIAGNOSIS — N2581 Secondary hyperparathyroidism of renal origin: Secondary | ICD-10-CM | POA: Diagnosis not present

## 2021-06-30 DIAGNOSIS — M6250 Muscle wasting and atrophy, not elsewhere classified, unspecified site: Secondary | ICD-10-CM | POA: Diagnosis not present

## 2021-06-30 DIAGNOSIS — Z992 Dependence on renal dialysis: Secondary | ICD-10-CM | POA: Diagnosis not present

## 2021-06-30 DIAGNOSIS — E1122 Type 2 diabetes mellitus with diabetic chronic kidney disease: Secondary | ICD-10-CM | POA: Diagnosis not present

## 2021-06-30 DIAGNOSIS — N186 End stage renal disease: Secondary | ICD-10-CM | POA: Diagnosis not present

## 2021-06-30 DIAGNOSIS — U071 COVID-19: Secondary | ICD-10-CM | POA: Diagnosis not present

## 2021-06-30 DIAGNOSIS — R197 Diarrhea, unspecified: Secondary | ICD-10-CM | POA: Diagnosis not present

## 2021-07-03 DIAGNOSIS — R197 Diarrhea, unspecified: Secondary | ICD-10-CM | POA: Diagnosis not present

## 2021-07-03 DIAGNOSIS — N2581 Secondary hyperparathyroidism of renal origin: Secondary | ICD-10-CM | POA: Diagnosis not present

## 2021-07-03 DIAGNOSIS — Z992 Dependence on renal dialysis: Secondary | ICD-10-CM | POA: Diagnosis not present

## 2021-07-03 DIAGNOSIS — E1122 Type 2 diabetes mellitus with diabetic chronic kidney disease: Secondary | ICD-10-CM | POA: Diagnosis not present

## 2021-07-03 DIAGNOSIS — N186 End stage renal disease: Secondary | ICD-10-CM | POA: Diagnosis not present

## 2021-07-04 DIAGNOSIS — U071 COVID-19: Secondary | ICD-10-CM | POA: Diagnosis not present

## 2021-07-04 DIAGNOSIS — Z992 Dependence on renal dialysis: Secondary | ICD-10-CM | POA: Diagnosis not present

## 2021-07-04 DIAGNOSIS — M6250 Muscle wasting and atrophy, not elsewhere classified, unspecified site: Secondary | ICD-10-CM | POA: Diagnosis not present

## 2021-07-05 DIAGNOSIS — Z992 Dependence on renal dialysis: Secondary | ICD-10-CM | POA: Diagnosis not present

## 2021-07-05 DIAGNOSIS — E1122 Type 2 diabetes mellitus with diabetic chronic kidney disease: Secondary | ICD-10-CM | POA: Diagnosis not present

## 2021-07-05 DIAGNOSIS — R197 Diarrhea, unspecified: Secondary | ICD-10-CM | POA: Diagnosis not present

## 2021-07-05 DIAGNOSIS — N186 End stage renal disease: Secondary | ICD-10-CM | POA: Diagnosis not present

## 2021-07-05 DIAGNOSIS — N2581 Secondary hyperparathyroidism of renal origin: Secondary | ICD-10-CM | POA: Diagnosis not present

## 2021-07-05 DIAGNOSIS — M6250 Muscle wasting and atrophy, not elsewhere classified, unspecified site: Secondary | ICD-10-CM | POA: Diagnosis not present

## 2021-07-05 DIAGNOSIS — U071 COVID-19: Secondary | ICD-10-CM | POA: Diagnosis not present

## 2021-07-06 DIAGNOSIS — M6250 Muscle wasting and atrophy, not elsewhere classified, unspecified site: Secondary | ICD-10-CM | POA: Diagnosis not present

## 2021-07-06 DIAGNOSIS — U071 COVID-19: Secondary | ICD-10-CM | POA: Diagnosis not present

## 2021-07-06 DIAGNOSIS — Z992 Dependence on renal dialysis: Secondary | ICD-10-CM | POA: Diagnosis not present

## 2021-07-07 DIAGNOSIS — E1122 Type 2 diabetes mellitus with diabetic chronic kidney disease: Secondary | ICD-10-CM | POA: Diagnosis not present

## 2021-07-07 DIAGNOSIS — R197 Diarrhea, unspecified: Secondary | ICD-10-CM | POA: Diagnosis not present

## 2021-07-07 DIAGNOSIS — N2581 Secondary hyperparathyroidism of renal origin: Secondary | ICD-10-CM | POA: Diagnosis not present

## 2021-07-07 DIAGNOSIS — Z992 Dependence on renal dialysis: Secondary | ICD-10-CM | POA: Diagnosis not present

## 2021-07-07 DIAGNOSIS — N186 End stage renal disease: Secondary | ICD-10-CM | POA: Diagnosis not present

## 2021-07-08 DIAGNOSIS — Z992 Dependence on renal dialysis: Secondary | ICD-10-CM | POA: Diagnosis not present

## 2021-07-08 DIAGNOSIS — S92911A Unspecified fracture of right toe(s), initial encounter for closed fracture: Secondary | ICD-10-CM | POA: Diagnosis not present

## 2021-07-08 DIAGNOSIS — M6250 Muscle wasting and atrophy, not elsewhere classified, unspecified site: Secondary | ICD-10-CM | POA: Diagnosis not present

## 2021-07-08 DIAGNOSIS — U071 COVID-19: Secondary | ICD-10-CM | POA: Diagnosis not present

## 2021-07-08 DIAGNOSIS — R278 Other lack of coordination: Secondary | ICD-10-CM | POA: Diagnosis not present

## 2021-07-08 DIAGNOSIS — M6281 Muscle weakness (generalized): Secondary | ICD-10-CM | POA: Diagnosis not present

## 2021-07-08 DIAGNOSIS — R262 Difficulty in walking, not elsewhere classified: Secondary | ICD-10-CM | POA: Diagnosis not present

## 2021-07-08 DIAGNOSIS — L8990 Pressure ulcer of unspecified site, unspecified stage: Secondary | ICD-10-CM | POA: Diagnosis not present

## 2021-07-08 DIAGNOSIS — R488 Other symbolic dysfunctions: Secondary | ICD-10-CM | POA: Diagnosis not present

## 2021-07-08 DIAGNOSIS — S43006A Unspecified dislocation of unspecified shoulder joint, initial encounter: Secondary | ICD-10-CM | POA: Diagnosis not present

## 2021-07-08 DIAGNOSIS — I87332 Chronic venous hypertension (idiopathic) with ulcer and inflammation of left lower extremity: Secondary | ICD-10-CM | POA: Diagnosis not present

## 2021-07-08 DIAGNOSIS — J449 Chronic obstructive pulmonary disease, unspecified: Secondary | ICD-10-CM | POA: Diagnosis not present

## 2021-07-10 DIAGNOSIS — R197 Diarrhea, unspecified: Secondary | ICD-10-CM | POA: Diagnosis not present

## 2021-07-10 DIAGNOSIS — U071 COVID-19: Secondary | ICD-10-CM | POA: Diagnosis not present

## 2021-07-10 DIAGNOSIS — R278 Other lack of coordination: Secondary | ICD-10-CM | POA: Diagnosis not present

## 2021-07-10 DIAGNOSIS — N186 End stage renal disease: Secondary | ICD-10-CM | POA: Diagnosis not present

## 2021-07-10 DIAGNOSIS — S92911A Unspecified fracture of right toe(s), initial encounter for closed fracture: Secondary | ICD-10-CM | POA: Diagnosis not present

## 2021-07-10 DIAGNOSIS — E1122 Type 2 diabetes mellitus with diabetic chronic kidney disease: Secondary | ICD-10-CM | POA: Diagnosis not present

## 2021-07-10 DIAGNOSIS — M6250 Muscle wasting and atrophy, not elsewhere classified, unspecified site: Secondary | ICD-10-CM | POA: Diagnosis not present

## 2021-07-10 DIAGNOSIS — I10 Essential (primary) hypertension: Secondary | ICD-10-CM | POA: Diagnosis not present

## 2021-07-10 DIAGNOSIS — M6281 Muscle weakness (generalized): Secondary | ICD-10-CM | POA: Diagnosis not present

## 2021-07-10 DIAGNOSIS — R488 Other symbolic dysfunctions: Secondary | ICD-10-CM | POA: Diagnosis not present

## 2021-07-10 DIAGNOSIS — R262 Difficulty in walking, not elsewhere classified: Secondary | ICD-10-CM | POA: Diagnosis not present

## 2021-07-10 DIAGNOSIS — Z992 Dependence on renal dialysis: Secondary | ICD-10-CM | POA: Diagnosis not present

## 2021-07-10 DIAGNOSIS — J449 Chronic obstructive pulmonary disease, unspecified: Secondary | ICD-10-CM | POA: Diagnosis not present

## 2021-07-10 DIAGNOSIS — N2581 Secondary hyperparathyroidism of renal origin: Secondary | ICD-10-CM | POA: Diagnosis not present

## 2021-07-10 DIAGNOSIS — S43006A Unspecified dislocation of unspecified shoulder joint, initial encounter: Secondary | ICD-10-CM | POA: Diagnosis not present

## 2021-07-10 DIAGNOSIS — I87332 Chronic venous hypertension (idiopathic) with ulcer and inflammation of left lower extremity: Secondary | ICD-10-CM | POA: Diagnosis not present

## 2021-07-10 DIAGNOSIS — L8990 Pressure ulcer of unspecified site, unspecified stage: Secondary | ICD-10-CM | POA: Diagnosis not present

## 2021-07-11 DIAGNOSIS — L8990 Pressure ulcer of unspecified site, unspecified stage: Secondary | ICD-10-CM | POA: Diagnosis not present

## 2021-07-11 DIAGNOSIS — I87332 Chronic venous hypertension (idiopathic) with ulcer and inflammation of left lower extremity: Secondary | ICD-10-CM | POA: Diagnosis not present

## 2021-07-11 DIAGNOSIS — M6250 Muscle wasting and atrophy, not elsewhere classified, unspecified site: Secondary | ICD-10-CM | POA: Diagnosis not present

## 2021-07-11 DIAGNOSIS — R278 Other lack of coordination: Secondary | ICD-10-CM | POA: Diagnosis not present

## 2021-07-11 DIAGNOSIS — S43006A Unspecified dislocation of unspecified shoulder joint, initial encounter: Secondary | ICD-10-CM | POA: Diagnosis not present

## 2021-07-11 DIAGNOSIS — Z992 Dependence on renal dialysis: Secondary | ICD-10-CM | POA: Diagnosis not present

## 2021-07-11 DIAGNOSIS — S92911A Unspecified fracture of right toe(s), initial encounter for closed fracture: Secondary | ICD-10-CM | POA: Diagnosis not present

## 2021-07-11 DIAGNOSIS — R488 Other symbolic dysfunctions: Secondary | ICD-10-CM | POA: Diagnosis not present

## 2021-07-11 DIAGNOSIS — U071 COVID-19: Secondary | ICD-10-CM | POA: Diagnosis not present

## 2021-07-11 DIAGNOSIS — J449 Chronic obstructive pulmonary disease, unspecified: Secondary | ICD-10-CM | POA: Diagnosis not present

## 2021-07-11 DIAGNOSIS — R262 Difficulty in walking, not elsewhere classified: Secondary | ICD-10-CM | POA: Diagnosis not present

## 2021-07-11 DIAGNOSIS — M6281 Muscle weakness (generalized): Secondary | ICD-10-CM | POA: Diagnosis not present

## 2021-07-12 DIAGNOSIS — N186 End stage renal disease: Secondary | ICD-10-CM | POA: Diagnosis not present

## 2021-07-12 DIAGNOSIS — N2581 Secondary hyperparathyroidism of renal origin: Secondary | ICD-10-CM | POA: Diagnosis not present

## 2021-07-12 DIAGNOSIS — Z992 Dependence on renal dialysis: Secondary | ICD-10-CM | POA: Diagnosis not present

## 2021-07-12 DIAGNOSIS — E1122 Type 2 diabetes mellitus with diabetic chronic kidney disease: Secondary | ICD-10-CM | POA: Diagnosis not present

## 2021-07-12 DIAGNOSIS — R197 Diarrhea, unspecified: Secondary | ICD-10-CM | POA: Diagnosis not present

## 2021-07-13 DIAGNOSIS — S43006A Unspecified dislocation of unspecified shoulder joint, initial encounter: Secondary | ICD-10-CM | POA: Diagnosis not present

## 2021-07-13 DIAGNOSIS — R262 Difficulty in walking, not elsewhere classified: Secondary | ICD-10-CM | POA: Diagnosis not present

## 2021-07-13 DIAGNOSIS — M6250 Muscle wasting and atrophy, not elsewhere classified, unspecified site: Secondary | ICD-10-CM | POA: Diagnosis not present

## 2021-07-13 DIAGNOSIS — M6281 Muscle weakness (generalized): Secondary | ICD-10-CM | POA: Diagnosis not present

## 2021-07-13 DIAGNOSIS — I87332 Chronic venous hypertension (idiopathic) with ulcer and inflammation of left lower extremity: Secondary | ICD-10-CM | POA: Diagnosis not present

## 2021-07-13 DIAGNOSIS — L8990 Pressure ulcer of unspecified site, unspecified stage: Secondary | ICD-10-CM | POA: Diagnosis not present

## 2021-07-13 DIAGNOSIS — U071 COVID-19: Secondary | ICD-10-CM | POA: Diagnosis not present

## 2021-07-13 DIAGNOSIS — R278 Other lack of coordination: Secondary | ICD-10-CM | POA: Diagnosis not present

## 2021-07-13 DIAGNOSIS — J449 Chronic obstructive pulmonary disease, unspecified: Secondary | ICD-10-CM | POA: Diagnosis not present

## 2021-07-13 DIAGNOSIS — R488 Other symbolic dysfunctions: Secondary | ICD-10-CM | POA: Diagnosis not present

## 2021-07-13 DIAGNOSIS — Z992 Dependence on renal dialysis: Secondary | ICD-10-CM | POA: Diagnosis not present

## 2021-07-13 DIAGNOSIS — S92911A Unspecified fracture of right toe(s), initial encounter for closed fracture: Secondary | ICD-10-CM | POA: Diagnosis not present

## 2021-07-14 DIAGNOSIS — R262 Difficulty in walking, not elsewhere classified: Secondary | ICD-10-CM | POA: Diagnosis not present

## 2021-07-14 DIAGNOSIS — M6281 Muscle weakness (generalized): Secondary | ICD-10-CM | POA: Diagnosis not present

## 2021-07-14 DIAGNOSIS — L8990 Pressure ulcer of unspecified site, unspecified stage: Secondary | ICD-10-CM | POA: Diagnosis not present

## 2021-07-14 DIAGNOSIS — R278 Other lack of coordination: Secondary | ICD-10-CM | POA: Diagnosis not present

## 2021-07-14 DIAGNOSIS — R488 Other symbolic dysfunctions: Secondary | ICD-10-CM | POA: Diagnosis not present

## 2021-07-14 DIAGNOSIS — E1122 Type 2 diabetes mellitus with diabetic chronic kidney disease: Secondary | ICD-10-CM | POA: Diagnosis not present

## 2021-07-14 DIAGNOSIS — M6250 Muscle wasting and atrophy, not elsewhere classified, unspecified site: Secondary | ICD-10-CM | POA: Diagnosis not present

## 2021-07-14 DIAGNOSIS — J449 Chronic obstructive pulmonary disease, unspecified: Secondary | ICD-10-CM | POA: Diagnosis not present

## 2021-07-14 DIAGNOSIS — Z992 Dependence on renal dialysis: Secondary | ICD-10-CM | POA: Diagnosis not present

## 2021-07-14 DIAGNOSIS — R197 Diarrhea, unspecified: Secondary | ICD-10-CM | POA: Diagnosis not present

## 2021-07-14 DIAGNOSIS — N186 End stage renal disease: Secondary | ICD-10-CM | POA: Diagnosis not present

## 2021-07-14 DIAGNOSIS — U071 COVID-19: Secondary | ICD-10-CM | POA: Diagnosis not present

## 2021-07-14 DIAGNOSIS — S43006A Unspecified dislocation of unspecified shoulder joint, initial encounter: Secondary | ICD-10-CM | POA: Diagnosis not present

## 2021-07-14 DIAGNOSIS — I87332 Chronic venous hypertension (idiopathic) with ulcer and inflammation of left lower extremity: Secondary | ICD-10-CM | POA: Diagnosis not present

## 2021-07-14 DIAGNOSIS — N2581 Secondary hyperparathyroidism of renal origin: Secondary | ICD-10-CM | POA: Diagnosis not present

## 2021-07-14 DIAGNOSIS — S92911A Unspecified fracture of right toe(s), initial encounter for closed fracture: Secondary | ICD-10-CM | POA: Diagnosis not present

## 2021-07-15 DIAGNOSIS — R278 Other lack of coordination: Secondary | ICD-10-CM | POA: Diagnosis not present

## 2021-07-15 DIAGNOSIS — J449 Chronic obstructive pulmonary disease, unspecified: Secondary | ICD-10-CM | POA: Diagnosis not present

## 2021-07-15 DIAGNOSIS — M6281 Muscle weakness (generalized): Secondary | ICD-10-CM | POA: Diagnosis not present

## 2021-07-15 DIAGNOSIS — L8990 Pressure ulcer of unspecified site, unspecified stage: Secondary | ICD-10-CM | POA: Diagnosis not present

## 2021-07-15 DIAGNOSIS — Z992 Dependence on renal dialysis: Secondary | ICD-10-CM | POA: Diagnosis not present

## 2021-07-15 DIAGNOSIS — M6250 Muscle wasting and atrophy, not elsewhere classified, unspecified site: Secondary | ICD-10-CM | POA: Diagnosis not present

## 2021-07-15 DIAGNOSIS — U071 COVID-19: Secondary | ICD-10-CM | POA: Diagnosis not present

## 2021-07-15 DIAGNOSIS — I87332 Chronic venous hypertension (idiopathic) with ulcer and inflammation of left lower extremity: Secondary | ICD-10-CM | POA: Diagnosis not present

## 2021-07-15 DIAGNOSIS — R262 Difficulty in walking, not elsewhere classified: Secondary | ICD-10-CM | POA: Diagnosis not present

## 2021-07-15 DIAGNOSIS — R488 Other symbolic dysfunctions: Secondary | ICD-10-CM | POA: Diagnosis not present

## 2021-07-15 DIAGNOSIS — S92911A Unspecified fracture of right toe(s), initial encounter for closed fracture: Secondary | ICD-10-CM | POA: Diagnosis not present

## 2021-07-15 DIAGNOSIS — S43006A Unspecified dislocation of unspecified shoulder joint, initial encounter: Secondary | ICD-10-CM | POA: Diagnosis not present

## 2021-07-16 DIAGNOSIS — R278 Other lack of coordination: Secondary | ICD-10-CM | POA: Diagnosis not present

## 2021-07-16 DIAGNOSIS — J449 Chronic obstructive pulmonary disease, unspecified: Secondary | ICD-10-CM | POA: Diagnosis not present

## 2021-07-16 DIAGNOSIS — R262 Difficulty in walking, not elsewhere classified: Secondary | ICD-10-CM | POA: Diagnosis not present

## 2021-07-16 DIAGNOSIS — S92911A Unspecified fracture of right toe(s), initial encounter for closed fracture: Secondary | ICD-10-CM | POA: Diagnosis not present

## 2021-07-16 DIAGNOSIS — Z992 Dependence on renal dialysis: Secondary | ICD-10-CM | POA: Diagnosis not present

## 2021-07-16 DIAGNOSIS — S43006A Unspecified dislocation of unspecified shoulder joint, initial encounter: Secondary | ICD-10-CM | POA: Diagnosis not present

## 2021-07-16 DIAGNOSIS — M6250 Muscle wasting and atrophy, not elsewhere classified, unspecified site: Secondary | ICD-10-CM | POA: Diagnosis not present

## 2021-07-16 DIAGNOSIS — M6281 Muscle weakness (generalized): Secondary | ICD-10-CM | POA: Diagnosis not present

## 2021-07-16 DIAGNOSIS — I87332 Chronic venous hypertension (idiopathic) with ulcer and inflammation of left lower extremity: Secondary | ICD-10-CM | POA: Diagnosis not present

## 2021-07-16 DIAGNOSIS — R488 Other symbolic dysfunctions: Secondary | ICD-10-CM | POA: Diagnosis not present

## 2021-07-16 DIAGNOSIS — L8990 Pressure ulcer of unspecified site, unspecified stage: Secondary | ICD-10-CM | POA: Diagnosis not present

## 2021-07-16 DIAGNOSIS — U071 COVID-19: Secondary | ICD-10-CM | POA: Diagnosis not present

## 2021-07-17 DIAGNOSIS — E1122 Type 2 diabetes mellitus with diabetic chronic kidney disease: Secondary | ICD-10-CM | POA: Diagnosis not present

## 2021-07-17 DIAGNOSIS — L8990 Pressure ulcer of unspecified site, unspecified stage: Secondary | ICD-10-CM | POA: Diagnosis not present

## 2021-07-17 DIAGNOSIS — U071 COVID-19: Secondary | ICD-10-CM | POA: Diagnosis not present

## 2021-07-17 DIAGNOSIS — R278 Other lack of coordination: Secondary | ICD-10-CM | POA: Diagnosis not present

## 2021-07-17 DIAGNOSIS — Z992 Dependence on renal dialysis: Secondary | ICD-10-CM | POA: Diagnosis not present

## 2021-07-17 DIAGNOSIS — I87332 Chronic venous hypertension (idiopathic) with ulcer and inflammation of left lower extremity: Secondary | ICD-10-CM | POA: Diagnosis not present

## 2021-07-17 DIAGNOSIS — N2581 Secondary hyperparathyroidism of renal origin: Secondary | ICD-10-CM | POA: Diagnosis not present

## 2021-07-17 DIAGNOSIS — S92911A Unspecified fracture of right toe(s), initial encounter for closed fracture: Secondary | ICD-10-CM | POA: Diagnosis not present

## 2021-07-17 DIAGNOSIS — M6281 Muscle weakness (generalized): Secondary | ICD-10-CM | POA: Diagnosis not present

## 2021-07-17 DIAGNOSIS — M6250 Muscle wasting and atrophy, not elsewhere classified, unspecified site: Secondary | ICD-10-CM | POA: Diagnosis not present

## 2021-07-17 DIAGNOSIS — R197 Diarrhea, unspecified: Secondary | ICD-10-CM | POA: Diagnosis not present

## 2021-07-17 DIAGNOSIS — J449 Chronic obstructive pulmonary disease, unspecified: Secondary | ICD-10-CM | POA: Diagnosis not present

## 2021-07-17 DIAGNOSIS — N186 End stage renal disease: Secondary | ICD-10-CM | POA: Diagnosis not present

## 2021-07-17 DIAGNOSIS — S43006A Unspecified dislocation of unspecified shoulder joint, initial encounter: Secondary | ICD-10-CM | POA: Diagnosis not present

## 2021-07-17 DIAGNOSIS — R488 Other symbolic dysfunctions: Secondary | ICD-10-CM | POA: Diagnosis not present

## 2021-07-17 DIAGNOSIS — R262 Difficulty in walking, not elsewhere classified: Secondary | ICD-10-CM | POA: Diagnosis not present

## 2021-07-18 DIAGNOSIS — S92911A Unspecified fracture of right toe(s), initial encounter for closed fracture: Secondary | ICD-10-CM | POA: Diagnosis not present

## 2021-07-18 DIAGNOSIS — S43006A Unspecified dislocation of unspecified shoulder joint, initial encounter: Secondary | ICD-10-CM | POA: Diagnosis not present

## 2021-07-18 DIAGNOSIS — M6250 Muscle wasting and atrophy, not elsewhere classified, unspecified site: Secondary | ICD-10-CM | POA: Diagnosis not present

## 2021-07-18 DIAGNOSIS — U071 COVID-19: Secondary | ICD-10-CM | POA: Diagnosis not present

## 2021-07-18 DIAGNOSIS — R262 Difficulty in walking, not elsewhere classified: Secondary | ICD-10-CM | POA: Diagnosis not present

## 2021-07-18 DIAGNOSIS — R488 Other symbolic dysfunctions: Secondary | ICD-10-CM | POA: Diagnosis not present

## 2021-07-18 DIAGNOSIS — Z992 Dependence on renal dialysis: Secondary | ICD-10-CM | POA: Diagnosis not present

## 2021-07-18 DIAGNOSIS — M6281 Muscle weakness (generalized): Secondary | ICD-10-CM | POA: Diagnosis not present

## 2021-07-18 DIAGNOSIS — R278 Other lack of coordination: Secondary | ICD-10-CM | POA: Diagnosis not present

## 2021-07-18 DIAGNOSIS — I87332 Chronic venous hypertension (idiopathic) with ulcer and inflammation of left lower extremity: Secondary | ICD-10-CM | POA: Diagnosis not present

## 2021-07-18 DIAGNOSIS — J449 Chronic obstructive pulmonary disease, unspecified: Secondary | ICD-10-CM | POA: Diagnosis not present

## 2021-07-18 DIAGNOSIS — L8990 Pressure ulcer of unspecified site, unspecified stage: Secondary | ICD-10-CM | POA: Diagnosis not present

## 2021-07-19 DIAGNOSIS — N186 End stage renal disease: Secondary | ICD-10-CM | POA: Diagnosis not present

## 2021-07-19 DIAGNOSIS — N2581 Secondary hyperparathyroidism of renal origin: Secondary | ICD-10-CM | POA: Diagnosis not present

## 2021-07-19 DIAGNOSIS — R197 Diarrhea, unspecified: Secondary | ICD-10-CM | POA: Diagnosis not present

## 2021-07-19 DIAGNOSIS — E1122 Type 2 diabetes mellitus with diabetic chronic kidney disease: Secondary | ICD-10-CM | POA: Diagnosis not present

## 2021-07-19 DIAGNOSIS — Z992 Dependence on renal dialysis: Secondary | ICD-10-CM | POA: Diagnosis not present

## 2021-07-20 DIAGNOSIS — R278 Other lack of coordination: Secondary | ICD-10-CM | POA: Diagnosis not present

## 2021-07-20 DIAGNOSIS — R262 Difficulty in walking, not elsewhere classified: Secondary | ICD-10-CM | POA: Diagnosis not present

## 2021-07-20 DIAGNOSIS — Z961 Presence of intraocular lens: Secondary | ICD-10-CM | POA: Diagnosis not present

## 2021-07-20 DIAGNOSIS — L8990 Pressure ulcer of unspecified site, unspecified stage: Secondary | ICD-10-CM | POA: Diagnosis not present

## 2021-07-20 DIAGNOSIS — I87332 Chronic venous hypertension (idiopathic) with ulcer and inflammation of left lower extremity: Secondary | ICD-10-CM | POA: Diagnosis not present

## 2021-07-20 DIAGNOSIS — S92911A Unspecified fracture of right toe(s), initial encounter for closed fracture: Secondary | ICD-10-CM | POA: Diagnosis not present

## 2021-07-20 DIAGNOSIS — M6281 Muscle weakness (generalized): Secondary | ICD-10-CM | POA: Diagnosis not present

## 2021-07-20 DIAGNOSIS — M6250 Muscle wasting and atrophy, not elsewhere classified, unspecified site: Secondary | ICD-10-CM | POA: Diagnosis not present

## 2021-07-20 DIAGNOSIS — Z992 Dependence on renal dialysis: Secondary | ICD-10-CM | POA: Diagnosis not present

## 2021-07-20 DIAGNOSIS — J449 Chronic obstructive pulmonary disease, unspecified: Secondary | ICD-10-CM | POA: Diagnosis not present

## 2021-07-20 DIAGNOSIS — U071 COVID-19: Secondary | ICD-10-CM | POA: Diagnosis not present

## 2021-07-20 DIAGNOSIS — H2511 Age-related nuclear cataract, right eye: Secondary | ICD-10-CM | POA: Diagnosis not present

## 2021-07-20 DIAGNOSIS — S43006A Unspecified dislocation of unspecified shoulder joint, initial encounter: Secondary | ICD-10-CM | POA: Diagnosis not present

## 2021-07-20 DIAGNOSIS — E113513 Type 2 diabetes mellitus with proliferative diabetic retinopathy with macular edema, bilateral: Secondary | ICD-10-CM | POA: Diagnosis not present

## 2021-07-20 DIAGNOSIS — R488 Other symbolic dysfunctions: Secondary | ICD-10-CM | POA: Diagnosis not present

## 2021-07-21 DIAGNOSIS — U071 COVID-19: Secondary | ICD-10-CM | POA: Diagnosis not present

## 2021-07-21 DIAGNOSIS — R278 Other lack of coordination: Secondary | ICD-10-CM | POA: Diagnosis not present

## 2021-07-21 DIAGNOSIS — N2581 Secondary hyperparathyroidism of renal origin: Secondary | ICD-10-CM | POA: Diagnosis not present

## 2021-07-21 DIAGNOSIS — M6281 Muscle weakness (generalized): Secondary | ICD-10-CM | POA: Diagnosis not present

## 2021-07-21 DIAGNOSIS — J449 Chronic obstructive pulmonary disease, unspecified: Secondary | ICD-10-CM | POA: Diagnosis not present

## 2021-07-21 DIAGNOSIS — S43006A Unspecified dislocation of unspecified shoulder joint, initial encounter: Secondary | ICD-10-CM | POA: Diagnosis not present

## 2021-07-21 DIAGNOSIS — S92911A Unspecified fracture of right toe(s), initial encounter for closed fracture: Secondary | ICD-10-CM | POA: Diagnosis not present

## 2021-07-21 DIAGNOSIS — I87332 Chronic venous hypertension (idiopathic) with ulcer and inflammation of left lower extremity: Secondary | ICD-10-CM | POA: Diagnosis not present

## 2021-07-21 DIAGNOSIS — Z992 Dependence on renal dialysis: Secondary | ICD-10-CM | POA: Diagnosis not present

## 2021-07-21 DIAGNOSIS — N186 End stage renal disease: Secondary | ICD-10-CM | POA: Diagnosis not present

## 2021-07-21 DIAGNOSIS — R197 Diarrhea, unspecified: Secondary | ICD-10-CM | POA: Diagnosis not present

## 2021-07-21 DIAGNOSIS — L8990 Pressure ulcer of unspecified site, unspecified stage: Secondary | ICD-10-CM | POA: Diagnosis not present

## 2021-07-21 DIAGNOSIS — R488 Other symbolic dysfunctions: Secondary | ICD-10-CM | POA: Diagnosis not present

## 2021-07-21 DIAGNOSIS — R262 Difficulty in walking, not elsewhere classified: Secondary | ICD-10-CM | POA: Diagnosis not present

## 2021-07-21 DIAGNOSIS — E1122 Type 2 diabetes mellitus with diabetic chronic kidney disease: Secondary | ICD-10-CM | POA: Diagnosis not present

## 2021-07-21 DIAGNOSIS — M6250 Muscle wasting and atrophy, not elsewhere classified, unspecified site: Secondary | ICD-10-CM | POA: Diagnosis not present

## 2021-07-22 DIAGNOSIS — R488 Other symbolic dysfunctions: Secondary | ICD-10-CM | POA: Diagnosis not present

## 2021-07-22 DIAGNOSIS — L8990 Pressure ulcer of unspecified site, unspecified stage: Secondary | ICD-10-CM | POA: Diagnosis not present

## 2021-07-22 DIAGNOSIS — R278 Other lack of coordination: Secondary | ICD-10-CM | POA: Diagnosis not present

## 2021-07-22 DIAGNOSIS — R262 Difficulty in walking, not elsewhere classified: Secondary | ICD-10-CM | POA: Diagnosis not present

## 2021-07-22 DIAGNOSIS — M6250 Muscle wasting and atrophy, not elsewhere classified, unspecified site: Secondary | ICD-10-CM | POA: Diagnosis not present

## 2021-07-22 DIAGNOSIS — U071 COVID-19: Secondary | ICD-10-CM | POA: Diagnosis not present

## 2021-07-22 DIAGNOSIS — S43006A Unspecified dislocation of unspecified shoulder joint, initial encounter: Secondary | ICD-10-CM | POA: Diagnosis not present

## 2021-07-22 DIAGNOSIS — M6281 Muscle weakness (generalized): Secondary | ICD-10-CM | POA: Diagnosis not present

## 2021-07-22 DIAGNOSIS — J449 Chronic obstructive pulmonary disease, unspecified: Secondary | ICD-10-CM | POA: Diagnosis not present

## 2021-07-22 DIAGNOSIS — S92911A Unspecified fracture of right toe(s), initial encounter for closed fracture: Secondary | ICD-10-CM | POA: Diagnosis not present

## 2021-07-22 DIAGNOSIS — Z992 Dependence on renal dialysis: Secondary | ICD-10-CM | POA: Diagnosis not present

## 2021-07-22 DIAGNOSIS — I87332 Chronic venous hypertension (idiopathic) with ulcer and inflammation of left lower extremity: Secondary | ICD-10-CM | POA: Diagnosis not present

## 2021-07-24 DIAGNOSIS — U071 COVID-19: Secondary | ICD-10-CM | POA: Diagnosis not present

## 2021-07-24 DIAGNOSIS — R278 Other lack of coordination: Secondary | ICD-10-CM | POA: Diagnosis not present

## 2021-07-24 DIAGNOSIS — I87332 Chronic venous hypertension (idiopathic) with ulcer and inflammation of left lower extremity: Secondary | ICD-10-CM | POA: Diagnosis not present

## 2021-07-24 DIAGNOSIS — N186 End stage renal disease: Secondary | ICD-10-CM | POA: Diagnosis not present

## 2021-07-24 DIAGNOSIS — J449 Chronic obstructive pulmonary disease, unspecified: Secondary | ICD-10-CM | POA: Diagnosis not present

## 2021-07-24 DIAGNOSIS — S43006A Unspecified dislocation of unspecified shoulder joint, initial encounter: Secondary | ICD-10-CM | POA: Diagnosis not present

## 2021-07-24 DIAGNOSIS — L8990 Pressure ulcer of unspecified site, unspecified stage: Secondary | ICD-10-CM | POA: Diagnosis not present

## 2021-07-24 DIAGNOSIS — E1122 Type 2 diabetes mellitus with diabetic chronic kidney disease: Secondary | ICD-10-CM | POA: Diagnosis not present

## 2021-07-24 DIAGNOSIS — R197 Diarrhea, unspecified: Secondary | ICD-10-CM | POA: Diagnosis not present

## 2021-07-24 DIAGNOSIS — N2581 Secondary hyperparathyroidism of renal origin: Secondary | ICD-10-CM | POA: Diagnosis not present

## 2021-07-24 DIAGNOSIS — R488 Other symbolic dysfunctions: Secondary | ICD-10-CM | POA: Diagnosis not present

## 2021-07-24 DIAGNOSIS — M6281 Muscle weakness (generalized): Secondary | ICD-10-CM | POA: Diagnosis not present

## 2021-07-24 DIAGNOSIS — R262 Difficulty in walking, not elsewhere classified: Secondary | ICD-10-CM | POA: Diagnosis not present

## 2021-07-24 DIAGNOSIS — M6250 Muscle wasting and atrophy, not elsewhere classified, unspecified site: Secondary | ICD-10-CM | POA: Diagnosis not present

## 2021-07-24 DIAGNOSIS — Z992 Dependence on renal dialysis: Secondary | ICD-10-CM | POA: Diagnosis not present

## 2021-07-24 DIAGNOSIS — S92911A Unspecified fracture of right toe(s), initial encounter for closed fracture: Secondary | ICD-10-CM | POA: Diagnosis not present

## 2021-07-25 DIAGNOSIS — R278 Other lack of coordination: Secondary | ICD-10-CM | POA: Diagnosis not present

## 2021-07-25 DIAGNOSIS — I87332 Chronic venous hypertension (idiopathic) with ulcer and inflammation of left lower extremity: Secondary | ICD-10-CM | POA: Diagnosis not present

## 2021-07-25 DIAGNOSIS — M6250 Muscle wasting and atrophy, not elsewhere classified, unspecified site: Secondary | ICD-10-CM | POA: Diagnosis not present

## 2021-07-25 DIAGNOSIS — U071 COVID-19: Secondary | ICD-10-CM | POA: Diagnosis not present

## 2021-07-25 DIAGNOSIS — S43006A Unspecified dislocation of unspecified shoulder joint, initial encounter: Secondary | ICD-10-CM | POA: Diagnosis not present

## 2021-07-25 DIAGNOSIS — R262 Difficulty in walking, not elsewhere classified: Secondary | ICD-10-CM | POA: Diagnosis not present

## 2021-07-25 DIAGNOSIS — S92911A Unspecified fracture of right toe(s), initial encounter for closed fracture: Secondary | ICD-10-CM | POA: Diagnosis not present

## 2021-07-25 DIAGNOSIS — M6281 Muscle weakness (generalized): Secondary | ICD-10-CM | POA: Diagnosis not present

## 2021-07-25 DIAGNOSIS — R488 Other symbolic dysfunctions: Secondary | ICD-10-CM | POA: Diagnosis not present

## 2021-07-25 DIAGNOSIS — L8990 Pressure ulcer of unspecified site, unspecified stage: Secondary | ICD-10-CM | POA: Diagnosis not present

## 2021-07-25 DIAGNOSIS — Z992 Dependence on renal dialysis: Secondary | ICD-10-CM | POA: Diagnosis not present

## 2021-07-25 DIAGNOSIS — J449 Chronic obstructive pulmonary disease, unspecified: Secondary | ICD-10-CM | POA: Diagnosis not present

## 2021-07-26 DIAGNOSIS — U071 COVID-19: Secondary | ICD-10-CM | POA: Diagnosis not present

## 2021-07-26 DIAGNOSIS — M6281 Muscle weakness (generalized): Secondary | ICD-10-CM | POA: Diagnosis not present

## 2021-07-26 DIAGNOSIS — E1122 Type 2 diabetes mellitus with diabetic chronic kidney disease: Secondary | ICD-10-CM | POA: Diagnosis not present

## 2021-07-26 DIAGNOSIS — M6250 Muscle wasting and atrophy, not elsewhere classified, unspecified site: Secondary | ICD-10-CM | POA: Diagnosis not present

## 2021-07-26 DIAGNOSIS — S92911A Unspecified fracture of right toe(s), initial encounter for closed fracture: Secondary | ICD-10-CM | POA: Diagnosis not present

## 2021-07-26 DIAGNOSIS — R197 Diarrhea, unspecified: Secondary | ICD-10-CM | POA: Diagnosis not present

## 2021-07-26 DIAGNOSIS — R278 Other lack of coordination: Secondary | ICD-10-CM | POA: Diagnosis not present

## 2021-07-26 DIAGNOSIS — S43006A Unspecified dislocation of unspecified shoulder joint, initial encounter: Secondary | ICD-10-CM | POA: Diagnosis not present

## 2021-07-26 DIAGNOSIS — L8990 Pressure ulcer of unspecified site, unspecified stage: Secondary | ICD-10-CM | POA: Diagnosis not present

## 2021-07-26 DIAGNOSIS — N186 End stage renal disease: Secondary | ICD-10-CM | POA: Diagnosis not present

## 2021-07-26 DIAGNOSIS — N2581 Secondary hyperparathyroidism of renal origin: Secondary | ICD-10-CM | POA: Diagnosis not present

## 2021-07-26 DIAGNOSIS — R488 Other symbolic dysfunctions: Secondary | ICD-10-CM | POA: Diagnosis not present

## 2021-07-26 DIAGNOSIS — J449 Chronic obstructive pulmonary disease, unspecified: Secondary | ICD-10-CM | POA: Diagnosis not present

## 2021-07-26 DIAGNOSIS — I87332 Chronic venous hypertension (idiopathic) with ulcer and inflammation of left lower extremity: Secondary | ICD-10-CM | POA: Diagnosis not present

## 2021-07-26 DIAGNOSIS — Z992 Dependence on renal dialysis: Secondary | ICD-10-CM | POA: Diagnosis not present

## 2021-07-26 DIAGNOSIS — R262 Difficulty in walking, not elsewhere classified: Secondary | ICD-10-CM | POA: Diagnosis not present

## 2021-07-27 DIAGNOSIS — M6281 Muscle weakness (generalized): Secondary | ICD-10-CM | POA: Diagnosis not present

## 2021-07-27 DIAGNOSIS — J449 Chronic obstructive pulmonary disease, unspecified: Secondary | ICD-10-CM | POA: Diagnosis not present

## 2021-07-27 DIAGNOSIS — S43006A Unspecified dislocation of unspecified shoulder joint, initial encounter: Secondary | ICD-10-CM | POA: Diagnosis not present

## 2021-07-27 DIAGNOSIS — I87332 Chronic venous hypertension (idiopathic) with ulcer and inflammation of left lower extremity: Secondary | ICD-10-CM | POA: Diagnosis not present

## 2021-07-27 DIAGNOSIS — M6250 Muscle wasting and atrophy, not elsewhere classified, unspecified site: Secondary | ICD-10-CM | POA: Diagnosis not present

## 2021-07-27 DIAGNOSIS — Z992 Dependence on renal dialysis: Secondary | ICD-10-CM | POA: Diagnosis not present

## 2021-07-27 DIAGNOSIS — S92911A Unspecified fracture of right toe(s), initial encounter for closed fracture: Secondary | ICD-10-CM | POA: Diagnosis not present

## 2021-07-27 DIAGNOSIS — R488 Other symbolic dysfunctions: Secondary | ICD-10-CM | POA: Diagnosis not present

## 2021-07-27 DIAGNOSIS — R262 Difficulty in walking, not elsewhere classified: Secondary | ICD-10-CM | POA: Diagnosis not present

## 2021-07-27 DIAGNOSIS — R278 Other lack of coordination: Secondary | ICD-10-CM | POA: Diagnosis not present

## 2021-07-27 DIAGNOSIS — U071 COVID-19: Secondary | ICD-10-CM | POA: Diagnosis not present

## 2021-07-27 DIAGNOSIS — L8990 Pressure ulcer of unspecified site, unspecified stage: Secondary | ICD-10-CM | POA: Diagnosis not present

## 2021-07-28 DIAGNOSIS — E1122 Type 2 diabetes mellitus with diabetic chronic kidney disease: Secondary | ICD-10-CM | POA: Diagnosis not present

## 2021-07-28 DIAGNOSIS — Z992 Dependence on renal dialysis: Secondary | ICD-10-CM | POA: Diagnosis not present

## 2021-07-28 DIAGNOSIS — I87332 Chronic venous hypertension (idiopathic) with ulcer and inflammation of left lower extremity: Secondary | ICD-10-CM | POA: Diagnosis not present

## 2021-07-28 DIAGNOSIS — J449 Chronic obstructive pulmonary disease, unspecified: Secondary | ICD-10-CM | POA: Diagnosis not present

## 2021-07-28 DIAGNOSIS — N186 End stage renal disease: Secondary | ICD-10-CM | POA: Diagnosis not present

## 2021-07-28 DIAGNOSIS — R262 Difficulty in walking, not elsewhere classified: Secondary | ICD-10-CM | POA: Diagnosis not present

## 2021-07-28 DIAGNOSIS — R278 Other lack of coordination: Secondary | ICD-10-CM | POA: Diagnosis not present

## 2021-07-28 DIAGNOSIS — N2581 Secondary hyperparathyroidism of renal origin: Secondary | ICD-10-CM | POA: Diagnosis not present

## 2021-07-28 DIAGNOSIS — R488 Other symbolic dysfunctions: Secondary | ICD-10-CM | POA: Diagnosis not present

## 2021-07-28 DIAGNOSIS — R197 Diarrhea, unspecified: Secondary | ICD-10-CM | POA: Diagnosis not present

## 2021-07-28 DIAGNOSIS — S92911A Unspecified fracture of right toe(s), initial encounter for closed fracture: Secondary | ICD-10-CM | POA: Diagnosis not present

## 2021-07-28 DIAGNOSIS — S43006A Unspecified dislocation of unspecified shoulder joint, initial encounter: Secondary | ICD-10-CM | POA: Diagnosis not present

## 2021-07-28 DIAGNOSIS — M6281 Muscle weakness (generalized): Secondary | ICD-10-CM | POA: Diagnosis not present

## 2021-07-28 DIAGNOSIS — U071 COVID-19: Secondary | ICD-10-CM | POA: Diagnosis not present

## 2021-07-28 DIAGNOSIS — L8990 Pressure ulcer of unspecified site, unspecified stage: Secondary | ICD-10-CM | POA: Diagnosis not present

## 2021-07-28 DIAGNOSIS — M6250 Muscle wasting and atrophy, not elsewhere classified, unspecified site: Secondary | ICD-10-CM | POA: Diagnosis not present

## 2021-07-29 DIAGNOSIS — Z992 Dependence on renal dialysis: Secondary | ICD-10-CM | POA: Diagnosis not present

## 2021-07-29 DIAGNOSIS — I87332 Chronic venous hypertension (idiopathic) with ulcer and inflammation of left lower extremity: Secondary | ICD-10-CM | POA: Diagnosis not present

## 2021-07-29 DIAGNOSIS — J449 Chronic obstructive pulmonary disease, unspecified: Secondary | ICD-10-CM | POA: Diagnosis not present

## 2021-07-29 DIAGNOSIS — L8990 Pressure ulcer of unspecified site, unspecified stage: Secondary | ICD-10-CM | POA: Diagnosis not present

## 2021-07-29 DIAGNOSIS — S92911A Unspecified fracture of right toe(s), initial encounter for closed fracture: Secondary | ICD-10-CM | POA: Diagnosis not present

## 2021-07-29 DIAGNOSIS — R262 Difficulty in walking, not elsewhere classified: Secondary | ICD-10-CM | POA: Diagnosis not present

## 2021-07-29 DIAGNOSIS — U071 COVID-19: Secondary | ICD-10-CM | POA: Diagnosis not present

## 2021-07-29 DIAGNOSIS — M6250 Muscle wasting and atrophy, not elsewhere classified, unspecified site: Secondary | ICD-10-CM | POA: Diagnosis not present

## 2021-07-29 DIAGNOSIS — R278 Other lack of coordination: Secondary | ICD-10-CM | POA: Diagnosis not present

## 2021-07-29 DIAGNOSIS — M6281 Muscle weakness (generalized): Secondary | ICD-10-CM | POA: Diagnosis not present

## 2021-07-29 DIAGNOSIS — R488 Other symbolic dysfunctions: Secondary | ICD-10-CM | POA: Diagnosis not present

## 2021-07-29 DIAGNOSIS — S43006A Unspecified dislocation of unspecified shoulder joint, initial encounter: Secondary | ICD-10-CM | POA: Diagnosis not present

## 2021-07-31 DIAGNOSIS — I87332 Chronic venous hypertension (idiopathic) with ulcer and inflammation of left lower extremity: Secondary | ICD-10-CM | POA: Diagnosis not present

## 2021-07-31 DIAGNOSIS — J449 Chronic obstructive pulmonary disease, unspecified: Secondary | ICD-10-CM | POA: Diagnosis not present

## 2021-07-31 DIAGNOSIS — N186 End stage renal disease: Secondary | ICD-10-CM | POA: Diagnosis not present

## 2021-07-31 DIAGNOSIS — U071 COVID-19: Secondary | ICD-10-CM | POA: Diagnosis not present

## 2021-07-31 DIAGNOSIS — R488 Other symbolic dysfunctions: Secondary | ICD-10-CM | POA: Diagnosis not present

## 2021-07-31 DIAGNOSIS — L8990 Pressure ulcer of unspecified site, unspecified stage: Secondary | ICD-10-CM | POA: Diagnosis not present

## 2021-07-31 DIAGNOSIS — S43006A Unspecified dislocation of unspecified shoulder joint, initial encounter: Secondary | ICD-10-CM | POA: Diagnosis not present

## 2021-07-31 DIAGNOSIS — R197 Diarrhea, unspecified: Secondary | ICD-10-CM | POA: Diagnosis not present

## 2021-07-31 DIAGNOSIS — R278 Other lack of coordination: Secondary | ICD-10-CM | POA: Diagnosis not present

## 2021-07-31 DIAGNOSIS — S92911A Unspecified fracture of right toe(s), initial encounter for closed fracture: Secondary | ICD-10-CM | POA: Diagnosis not present

## 2021-07-31 DIAGNOSIS — N2581 Secondary hyperparathyroidism of renal origin: Secondary | ICD-10-CM | POA: Diagnosis not present

## 2021-07-31 DIAGNOSIS — M6281 Muscle weakness (generalized): Secondary | ICD-10-CM | POA: Diagnosis not present

## 2021-07-31 DIAGNOSIS — Z992 Dependence on renal dialysis: Secondary | ICD-10-CM | POA: Diagnosis not present

## 2021-07-31 DIAGNOSIS — R262 Difficulty in walking, not elsewhere classified: Secondary | ICD-10-CM | POA: Diagnosis not present

## 2021-07-31 DIAGNOSIS — E1122 Type 2 diabetes mellitus with diabetic chronic kidney disease: Secondary | ICD-10-CM | POA: Diagnosis not present

## 2021-07-31 DIAGNOSIS — M6250 Muscle wasting and atrophy, not elsewhere classified, unspecified site: Secondary | ICD-10-CM | POA: Diagnosis not present

## 2021-08-01 DIAGNOSIS — J449 Chronic obstructive pulmonary disease, unspecified: Secondary | ICD-10-CM | POA: Diagnosis not present

## 2021-08-01 DIAGNOSIS — U071 COVID-19: Secondary | ICD-10-CM | POA: Diagnosis not present

## 2021-08-01 DIAGNOSIS — S92911A Unspecified fracture of right toe(s), initial encounter for closed fracture: Secondary | ICD-10-CM | POA: Diagnosis not present

## 2021-08-01 DIAGNOSIS — Z992 Dependence on renal dialysis: Secondary | ICD-10-CM | POA: Diagnosis not present

## 2021-08-01 DIAGNOSIS — M6281 Muscle weakness (generalized): Secondary | ICD-10-CM | POA: Diagnosis not present

## 2021-08-01 DIAGNOSIS — R278 Other lack of coordination: Secondary | ICD-10-CM | POA: Diagnosis not present

## 2021-08-01 DIAGNOSIS — R262 Difficulty in walking, not elsewhere classified: Secondary | ICD-10-CM | POA: Diagnosis not present

## 2021-08-01 DIAGNOSIS — M6250 Muscle wasting and atrophy, not elsewhere classified, unspecified site: Secondary | ICD-10-CM | POA: Diagnosis not present

## 2021-08-01 DIAGNOSIS — I87332 Chronic venous hypertension (idiopathic) with ulcer and inflammation of left lower extremity: Secondary | ICD-10-CM | POA: Diagnosis not present

## 2021-08-01 DIAGNOSIS — S43006A Unspecified dislocation of unspecified shoulder joint, initial encounter: Secondary | ICD-10-CM | POA: Diagnosis not present

## 2021-08-01 DIAGNOSIS — L8990 Pressure ulcer of unspecified site, unspecified stage: Secondary | ICD-10-CM | POA: Diagnosis not present

## 2021-08-01 DIAGNOSIS — R488 Other symbolic dysfunctions: Secondary | ICD-10-CM | POA: Diagnosis not present

## 2021-08-02 DIAGNOSIS — S92911A Unspecified fracture of right toe(s), initial encounter for closed fracture: Secondary | ICD-10-CM | POA: Diagnosis not present

## 2021-08-02 DIAGNOSIS — Z992 Dependence on renal dialysis: Secondary | ICD-10-CM | POA: Diagnosis not present

## 2021-08-02 DIAGNOSIS — J449 Chronic obstructive pulmonary disease, unspecified: Secondary | ICD-10-CM | POA: Diagnosis not present

## 2021-08-02 DIAGNOSIS — M6281 Muscle weakness (generalized): Secondary | ICD-10-CM | POA: Diagnosis not present

## 2021-08-02 DIAGNOSIS — E1122 Type 2 diabetes mellitus with diabetic chronic kidney disease: Secondary | ICD-10-CM | POA: Diagnosis not present

## 2021-08-02 DIAGNOSIS — M6250 Muscle wasting and atrophy, not elsewhere classified, unspecified site: Secondary | ICD-10-CM | POA: Diagnosis not present

## 2021-08-02 DIAGNOSIS — I87332 Chronic venous hypertension (idiopathic) with ulcer and inflammation of left lower extremity: Secondary | ICD-10-CM | POA: Diagnosis not present

## 2021-08-02 DIAGNOSIS — N2581 Secondary hyperparathyroidism of renal origin: Secondary | ICD-10-CM | POA: Diagnosis not present

## 2021-08-02 DIAGNOSIS — R197 Diarrhea, unspecified: Secondary | ICD-10-CM | POA: Diagnosis not present

## 2021-08-02 DIAGNOSIS — N186 End stage renal disease: Secondary | ICD-10-CM | POA: Diagnosis not present

## 2021-08-02 DIAGNOSIS — R262 Difficulty in walking, not elsewhere classified: Secondary | ICD-10-CM | POA: Diagnosis not present

## 2021-08-02 DIAGNOSIS — R278 Other lack of coordination: Secondary | ICD-10-CM | POA: Diagnosis not present

## 2021-08-02 DIAGNOSIS — U071 COVID-19: Secondary | ICD-10-CM | POA: Diagnosis not present

## 2021-08-02 DIAGNOSIS — R488 Other symbolic dysfunctions: Secondary | ICD-10-CM | POA: Diagnosis not present

## 2021-08-02 DIAGNOSIS — L8990 Pressure ulcer of unspecified site, unspecified stage: Secondary | ICD-10-CM | POA: Diagnosis not present

## 2021-08-02 DIAGNOSIS — S43006A Unspecified dislocation of unspecified shoulder joint, initial encounter: Secondary | ICD-10-CM | POA: Diagnosis not present

## 2021-08-03 DIAGNOSIS — R488 Other symbolic dysfunctions: Secondary | ICD-10-CM | POA: Diagnosis not present

## 2021-08-03 DIAGNOSIS — M6250 Muscle wasting and atrophy, not elsewhere classified, unspecified site: Secondary | ICD-10-CM | POA: Diagnosis not present

## 2021-08-03 DIAGNOSIS — J449 Chronic obstructive pulmonary disease, unspecified: Secondary | ICD-10-CM | POA: Diagnosis not present

## 2021-08-03 DIAGNOSIS — M6281 Muscle weakness (generalized): Secondary | ICD-10-CM | POA: Diagnosis not present

## 2021-08-03 DIAGNOSIS — S43006A Unspecified dislocation of unspecified shoulder joint, initial encounter: Secondary | ICD-10-CM | POA: Diagnosis not present

## 2021-08-03 DIAGNOSIS — Z992 Dependence on renal dialysis: Secondary | ICD-10-CM | POA: Diagnosis not present

## 2021-08-03 DIAGNOSIS — U071 COVID-19: Secondary | ICD-10-CM | POA: Diagnosis not present

## 2021-08-03 DIAGNOSIS — R262 Difficulty in walking, not elsewhere classified: Secondary | ICD-10-CM | POA: Diagnosis not present

## 2021-08-03 DIAGNOSIS — I87332 Chronic venous hypertension (idiopathic) with ulcer and inflammation of left lower extremity: Secondary | ICD-10-CM | POA: Diagnosis not present

## 2021-08-03 DIAGNOSIS — R278 Other lack of coordination: Secondary | ICD-10-CM | POA: Diagnosis not present

## 2021-08-03 DIAGNOSIS — L8990 Pressure ulcer of unspecified site, unspecified stage: Secondary | ICD-10-CM | POA: Diagnosis not present

## 2021-08-03 DIAGNOSIS — S92911A Unspecified fracture of right toe(s), initial encounter for closed fracture: Secondary | ICD-10-CM | POA: Diagnosis not present

## 2021-08-04 DIAGNOSIS — Z992 Dependence on renal dialysis: Secondary | ICD-10-CM | POA: Diagnosis not present

## 2021-08-04 DIAGNOSIS — N2581 Secondary hyperparathyroidism of renal origin: Secondary | ICD-10-CM | POA: Diagnosis not present

## 2021-08-04 DIAGNOSIS — R197 Diarrhea, unspecified: Secondary | ICD-10-CM | POA: Diagnosis not present

## 2021-08-04 DIAGNOSIS — N186 End stage renal disease: Secondary | ICD-10-CM | POA: Diagnosis not present

## 2021-08-04 DIAGNOSIS — E1122 Type 2 diabetes mellitus with diabetic chronic kidney disease: Secondary | ICD-10-CM | POA: Diagnosis not present

## 2021-08-05 DIAGNOSIS — S43006A Unspecified dislocation of unspecified shoulder joint, initial encounter: Secondary | ICD-10-CM | POA: Diagnosis not present

## 2021-08-05 DIAGNOSIS — M6250 Muscle wasting and atrophy, not elsewhere classified, unspecified site: Secondary | ICD-10-CM | POA: Diagnosis not present

## 2021-08-05 DIAGNOSIS — M6281 Muscle weakness (generalized): Secondary | ICD-10-CM | POA: Diagnosis not present

## 2021-08-05 DIAGNOSIS — L8990 Pressure ulcer of unspecified site, unspecified stage: Secondary | ICD-10-CM | POA: Diagnosis not present

## 2021-08-05 DIAGNOSIS — I87332 Chronic venous hypertension (idiopathic) with ulcer and inflammation of left lower extremity: Secondary | ICD-10-CM | POA: Diagnosis not present

## 2021-08-05 DIAGNOSIS — U071 COVID-19: Secondary | ICD-10-CM | POA: Diagnosis not present

## 2021-08-05 DIAGNOSIS — J449 Chronic obstructive pulmonary disease, unspecified: Secondary | ICD-10-CM | POA: Diagnosis not present

## 2021-08-05 DIAGNOSIS — R278 Other lack of coordination: Secondary | ICD-10-CM | POA: Diagnosis not present

## 2021-08-05 DIAGNOSIS — Z992 Dependence on renal dialysis: Secondary | ICD-10-CM | POA: Diagnosis not present

## 2021-08-05 DIAGNOSIS — R488 Other symbolic dysfunctions: Secondary | ICD-10-CM | POA: Diagnosis not present

## 2021-08-05 DIAGNOSIS — R262 Difficulty in walking, not elsewhere classified: Secondary | ICD-10-CM | POA: Diagnosis not present

## 2021-08-05 DIAGNOSIS — S92911A Unspecified fracture of right toe(s), initial encounter for closed fracture: Secondary | ICD-10-CM | POA: Diagnosis not present

## 2021-08-07 DIAGNOSIS — N2581 Secondary hyperparathyroidism of renal origin: Secondary | ICD-10-CM | POA: Diagnosis not present

## 2021-08-07 DIAGNOSIS — E1122 Type 2 diabetes mellitus with diabetic chronic kidney disease: Secondary | ICD-10-CM | POA: Diagnosis not present

## 2021-08-07 DIAGNOSIS — R197 Diarrhea, unspecified: Secondary | ICD-10-CM | POA: Diagnosis not present

## 2021-08-07 DIAGNOSIS — N186 End stage renal disease: Secondary | ICD-10-CM | POA: Diagnosis not present

## 2021-08-07 DIAGNOSIS — Z992 Dependence on renal dialysis: Secondary | ICD-10-CM | POA: Diagnosis not present

## 2021-08-08 DIAGNOSIS — I87332 Chronic venous hypertension (idiopathic) with ulcer and inflammation of left lower extremity: Secondary | ICD-10-CM | POA: Diagnosis not present

## 2021-08-08 DIAGNOSIS — R262 Difficulty in walking, not elsewhere classified: Secondary | ICD-10-CM | POA: Diagnosis not present

## 2021-08-08 DIAGNOSIS — I10 Essential (primary) hypertension: Secondary | ICD-10-CM | POA: Diagnosis not present

## 2021-08-08 DIAGNOSIS — R278 Other lack of coordination: Secondary | ICD-10-CM | POA: Diagnosis not present

## 2021-08-08 DIAGNOSIS — M6281 Muscle weakness (generalized): Secondary | ICD-10-CM | POA: Diagnosis not present

## 2021-08-08 DIAGNOSIS — J449 Chronic obstructive pulmonary disease, unspecified: Secondary | ICD-10-CM | POA: Diagnosis not present

## 2021-08-08 DIAGNOSIS — R488 Other symbolic dysfunctions: Secondary | ICD-10-CM | POA: Diagnosis not present

## 2021-08-08 DIAGNOSIS — S43006A Unspecified dislocation of unspecified shoulder joint, initial encounter: Secondary | ICD-10-CM | POA: Diagnosis not present

## 2021-08-08 DIAGNOSIS — S82143A Displaced bicondylar fracture of unspecified tibia, initial encounter for closed fracture: Secondary | ICD-10-CM | POA: Diagnosis not present

## 2021-08-08 DIAGNOSIS — E1122 Type 2 diabetes mellitus with diabetic chronic kidney disease: Secondary | ICD-10-CM | POA: Diagnosis not present

## 2021-08-08 DIAGNOSIS — R55 Syncope and collapse: Secondary | ICD-10-CM | POA: Diagnosis not present

## 2021-08-08 DIAGNOSIS — S92911A Unspecified fracture of right toe(s), initial encounter for closed fracture: Secondary | ICD-10-CM | POA: Diagnosis not present

## 2021-08-08 DIAGNOSIS — L8990 Pressure ulcer of unspecified site, unspecified stage: Secondary | ICD-10-CM | POA: Diagnosis not present

## 2021-08-09 DIAGNOSIS — R278 Other lack of coordination: Secondary | ICD-10-CM | POA: Diagnosis not present

## 2021-08-09 DIAGNOSIS — S82143A Displaced bicondylar fracture of unspecified tibia, initial encounter for closed fracture: Secondary | ICD-10-CM | POA: Diagnosis not present

## 2021-08-09 DIAGNOSIS — N2581 Secondary hyperparathyroidism of renal origin: Secondary | ICD-10-CM | POA: Diagnosis not present

## 2021-08-09 DIAGNOSIS — N186 End stage renal disease: Secondary | ICD-10-CM | POA: Diagnosis not present

## 2021-08-09 DIAGNOSIS — R262 Difficulty in walking, not elsewhere classified: Secondary | ICD-10-CM | POA: Diagnosis not present

## 2021-08-09 DIAGNOSIS — S43006A Unspecified dislocation of unspecified shoulder joint, initial encounter: Secondary | ICD-10-CM | POA: Diagnosis not present

## 2021-08-09 DIAGNOSIS — E1122 Type 2 diabetes mellitus with diabetic chronic kidney disease: Secondary | ICD-10-CM | POA: Diagnosis not present

## 2021-08-09 DIAGNOSIS — J449 Chronic obstructive pulmonary disease, unspecified: Secondary | ICD-10-CM | POA: Diagnosis not present

## 2021-08-09 DIAGNOSIS — Z992 Dependence on renal dialysis: Secondary | ICD-10-CM | POA: Diagnosis not present

## 2021-08-09 DIAGNOSIS — S92911A Unspecified fracture of right toe(s), initial encounter for closed fracture: Secondary | ICD-10-CM | POA: Diagnosis not present

## 2021-08-09 DIAGNOSIS — R488 Other symbolic dysfunctions: Secondary | ICD-10-CM | POA: Diagnosis not present

## 2021-08-09 DIAGNOSIS — R55 Syncope and collapse: Secondary | ICD-10-CM | POA: Diagnosis not present

## 2021-08-09 DIAGNOSIS — M6281 Muscle weakness (generalized): Secondary | ICD-10-CM | POA: Diagnosis not present

## 2021-08-09 DIAGNOSIS — M10441 Other secondary gout, right hand: Secondary | ICD-10-CM | POA: Diagnosis not present

## 2021-08-09 DIAGNOSIS — D509 Iron deficiency anemia, unspecified: Secondary | ICD-10-CM | POA: Diagnosis not present

## 2021-08-09 DIAGNOSIS — R197 Diarrhea, unspecified: Secondary | ICD-10-CM | POA: Diagnosis not present

## 2021-08-09 DIAGNOSIS — D631 Anemia in chronic kidney disease: Secondary | ICD-10-CM | POA: Diagnosis not present

## 2021-08-09 DIAGNOSIS — I87332 Chronic venous hypertension (idiopathic) with ulcer and inflammation of left lower extremity: Secondary | ICD-10-CM | POA: Diagnosis not present

## 2021-08-09 DIAGNOSIS — L8990 Pressure ulcer of unspecified site, unspecified stage: Secondary | ICD-10-CM | POA: Diagnosis not present

## 2021-08-10 DIAGNOSIS — Z79899 Other long term (current) drug therapy: Secondary | ICD-10-CM | POA: Diagnosis not present

## 2021-08-10 DIAGNOSIS — J449 Chronic obstructive pulmonary disease, unspecified: Secondary | ICD-10-CM | POA: Diagnosis not present

## 2021-08-10 DIAGNOSIS — N186 End stage renal disease: Secondary | ICD-10-CM | POA: Diagnosis not present

## 2021-08-10 DIAGNOSIS — S82143A Displaced bicondylar fracture of unspecified tibia, initial encounter for closed fracture: Secondary | ICD-10-CM | POA: Diagnosis not present

## 2021-08-10 DIAGNOSIS — R55 Syncope and collapse: Secondary | ICD-10-CM | POA: Diagnosis not present

## 2021-08-10 DIAGNOSIS — M6281 Muscle weakness (generalized): Secondary | ICD-10-CM | POA: Diagnosis not present

## 2021-08-10 DIAGNOSIS — S92911A Unspecified fracture of right toe(s), initial encounter for closed fracture: Secondary | ICD-10-CM | POA: Diagnosis not present

## 2021-08-10 DIAGNOSIS — I639 Cerebral infarction, unspecified: Secondary | ICD-10-CM | POA: Diagnosis not present

## 2021-08-10 DIAGNOSIS — E1122 Type 2 diabetes mellitus with diabetic chronic kidney disease: Secondary | ICD-10-CM | POA: Diagnosis not present

## 2021-08-10 DIAGNOSIS — M109 Gout, unspecified: Secondary | ICD-10-CM | POA: Diagnosis not present

## 2021-08-10 DIAGNOSIS — R262 Difficulty in walking, not elsewhere classified: Secondary | ICD-10-CM | POA: Diagnosis not present

## 2021-08-10 DIAGNOSIS — R278 Other lack of coordination: Secondary | ICD-10-CM | POA: Diagnosis not present

## 2021-08-10 DIAGNOSIS — I87332 Chronic venous hypertension (idiopathic) with ulcer and inflammation of left lower extremity: Secondary | ICD-10-CM | POA: Diagnosis not present

## 2021-08-10 DIAGNOSIS — L8990 Pressure ulcer of unspecified site, unspecified stage: Secondary | ICD-10-CM | POA: Diagnosis not present

## 2021-08-10 DIAGNOSIS — R488 Other symbolic dysfunctions: Secondary | ICD-10-CM | POA: Diagnosis not present

## 2021-08-10 DIAGNOSIS — S43006A Unspecified dislocation of unspecified shoulder joint, initial encounter: Secondary | ICD-10-CM | POA: Diagnosis not present

## 2021-08-11 DIAGNOSIS — N186 End stage renal disease: Secondary | ICD-10-CM | POA: Diagnosis not present

## 2021-08-11 DIAGNOSIS — E1122 Type 2 diabetes mellitus with diabetic chronic kidney disease: Secondary | ICD-10-CM | POA: Diagnosis not present

## 2021-08-11 DIAGNOSIS — D509 Iron deficiency anemia, unspecified: Secondary | ICD-10-CM | POA: Diagnosis not present

## 2021-08-11 DIAGNOSIS — N2581 Secondary hyperparathyroidism of renal origin: Secondary | ICD-10-CM | POA: Diagnosis not present

## 2021-08-11 DIAGNOSIS — M10441 Other secondary gout, right hand: Secondary | ICD-10-CM | POA: Diagnosis not present

## 2021-08-11 DIAGNOSIS — R197 Diarrhea, unspecified: Secondary | ICD-10-CM | POA: Diagnosis not present

## 2021-08-11 DIAGNOSIS — Z992 Dependence on renal dialysis: Secondary | ICD-10-CM | POA: Diagnosis not present

## 2021-08-11 DIAGNOSIS — D631 Anemia in chronic kidney disease: Secondary | ICD-10-CM | POA: Diagnosis not present

## 2021-08-12 DIAGNOSIS — M19041 Primary osteoarthritis, right hand: Secondary | ICD-10-CM | POA: Diagnosis not present

## 2021-08-12 DIAGNOSIS — R2231 Localized swelling, mass and lump, right upper limb: Secondary | ICD-10-CM | POA: Diagnosis not present

## 2021-08-14 DIAGNOSIS — E1122 Type 2 diabetes mellitus with diabetic chronic kidney disease: Secondary | ICD-10-CM | POA: Diagnosis not present

## 2021-08-14 DIAGNOSIS — D509 Iron deficiency anemia, unspecified: Secondary | ICD-10-CM | POA: Diagnosis not present

## 2021-08-14 DIAGNOSIS — N2581 Secondary hyperparathyroidism of renal origin: Secondary | ICD-10-CM | POA: Diagnosis not present

## 2021-08-14 DIAGNOSIS — M10441 Other secondary gout, right hand: Secondary | ICD-10-CM | POA: Diagnosis not present

## 2021-08-14 DIAGNOSIS — R197 Diarrhea, unspecified: Secondary | ICD-10-CM | POA: Diagnosis not present

## 2021-08-14 DIAGNOSIS — D631 Anemia in chronic kidney disease: Secondary | ICD-10-CM | POA: Diagnosis not present

## 2021-08-14 DIAGNOSIS — N186 End stage renal disease: Secondary | ICD-10-CM | POA: Diagnosis not present

## 2021-08-14 DIAGNOSIS — Z992 Dependence on renal dialysis: Secondary | ICD-10-CM | POA: Diagnosis not present

## 2021-08-15 DIAGNOSIS — S92911A Unspecified fracture of right toe(s), initial encounter for closed fracture: Secondary | ICD-10-CM | POA: Diagnosis not present

## 2021-08-15 DIAGNOSIS — S82143A Displaced bicondylar fracture of unspecified tibia, initial encounter for closed fracture: Secondary | ICD-10-CM | POA: Diagnosis not present

## 2021-08-15 DIAGNOSIS — I87332 Chronic venous hypertension (idiopathic) with ulcer and inflammation of left lower extremity: Secondary | ICD-10-CM | POA: Diagnosis not present

## 2021-08-15 DIAGNOSIS — R262 Difficulty in walking, not elsewhere classified: Secondary | ICD-10-CM | POA: Diagnosis not present

## 2021-08-15 DIAGNOSIS — L8990 Pressure ulcer of unspecified site, unspecified stage: Secondary | ICD-10-CM | POA: Diagnosis not present

## 2021-08-15 DIAGNOSIS — J449 Chronic obstructive pulmonary disease, unspecified: Secondary | ICD-10-CM | POA: Diagnosis not present

## 2021-08-15 DIAGNOSIS — S43006A Unspecified dislocation of unspecified shoulder joint, initial encounter: Secondary | ICD-10-CM | POA: Diagnosis not present

## 2021-08-15 DIAGNOSIS — R488 Other symbolic dysfunctions: Secondary | ICD-10-CM | POA: Diagnosis not present

## 2021-08-15 DIAGNOSIS — R278 Other lack of coordination: Secondary | ICD-10-CM | POA: Diagnosis not present

## 2021-08-15 DIAGNOSIS — R55 Syncope and collapse: Secondary | ICD-10-CM | POA: Diagnosis not present

## 2021-08-15 DIAGNOSIS — M6281 Muscle weakness (generalized): Secondary | ICD-10-CM | POA: Diagnosis not present

## 2021-08-16 DIAGNOSIS — D509 Iron deficiency anemia, unspecified: Secondary | ICD-10-CM | POA: Diagnosis not present

## 2021-08-16 DIAGNOSIS — N2581 Secondary hyperparathyroidism of renal origin: Secondary | ICD-10-CM | POA: Diagnosis not present

## 2021-08-16 DIAGNOSIS — Z992 Dependence on renal dialysis: Secondary | ICD-10-CM | POA: Diagnosis not present

## 2021-08-16 DIAGNOSIS — N186 End stage renal disease: Secondary | ICD-10-CM | POA: Diagnosis not present

## 2021-08-16 DIAGNOSIS — R197 Diarrhea, unspecified: Secondary | ICD-10-CM | POA: Diagnosis not present

## 2021-08-16 DIAGNOSIS — E1122 Type 2 diabetes mellitus with diabetic chronic kidney disease: Secondary | ICD-10-CM | POA: Diagnosis not present

## 2021-08-16 DIAGNOSIS — M10441 Other secondary gout, right hand: Secondary | ICD-10-CM | POA: Diagnosis not present

## 2021-08-16 DIAGNOSIS — D631 Anemia in chronic kidney disease: Secondary | ICD-10-CM | POA: Diagnosis not present
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
## 2021-08-17 ENCOUNTER — Ambulatory Visit: Payer: Medicare Other | Admitting: Adult Health

## 2021-08-18 DIAGNOSIS — M6281 Muscle weakness (generalized): Secondary | ICD-10-CM | POA: Diagnosis not present

## 2021-08-18 DIAGNOSIS — D509 Iron deficiency anemia, unspecified: Secondary | ICD-10-CM | POA: Diagnosis not present

## 2021-08-18 DIAGNOSIS — Z992 Dependence on renal dialysis: Secondary | ICD-10-CM | POA: Diagnosis not present

## 2021-08-18 DIAGNOSIS — D631 Anemia in chronic kidney disease: Secondary | ICD-10-CM | POA: Diagnosis not present

## 2021-08-18 DIAGNOSIS — S43006A Unspecified dislocation of unspecified shoulder joint, initial encounter: Secondary | ICD-10-CM | POA: Diagnosis not present

## 2021-08-18 DIAGNOSIS — M10441 Other secondary gout, right hand: Secondary | ICD-10-CM | POA: Diagnosis not present

## 2021-08-18 DIAGNOSIS — N2581 Secondary hyperparathyroidism of renal origin: Secondary | ICD-10-CM | POA: Diagnosis not present

## 2021-08-18 DIAGNOSIS — S92911A Unspecified fracture of right toe(s), initial encounter for closed fracture: Secondary | ICD-10-CM | POA: Diagnosis not present

## 2021-08-18 DIAGNOSIS — R197 Diarrhea, unspecified: Secondary | ICD-10-CM | POA: Diagnosis not present

## 2021-08-18 DIAGNOSIS — S82143A Displaced bicondylar fracture of unspecified tibia, initial encounter for closed fracture: Secondary | ICD-10-CM | POA: Diagnosis not present

## 2021-08-18 DIAGNOSIS — E1122 Type 2 diabetes mellitus with diabetic chronic kidney disease: Secondary | ICD-10-CM | POA: Diagnosis not present

## 2021-08-18 DIAGNOSIS — J449 Chronic obstructive pulmonary disease, unspecified: Secondary | ICD-10-CM | POA: Diagnosis not present

## 2021-08-18 DIAGNOSIS — R262 Difficulty in walking, not elsewhere classified: Secondary | ICD-10-CM | POA: Diagnosis not present

## 2021-08-18 DIAGNOSIS — R488 Other symbolic dysfunctions: Secondary | ICD-10-CM | POA: Diagnosis not present

## 2021-08-18 DIAGNOSIS — I87332 Chronic venous hypertension (idiopathic) with ulcer and inflammation of left lower extremity: Secondary | ICD-10-CM | POA: Diagnosis not present

## 2021-08-18 DIAGNOSIS — R278 Other lack of coordination: Secondary | ICD-10-CM | POA: Diagnosis not present

## 2021-08-18 DIAGNOSIS — L8990 Pressure ulcer of unspecified site, unspecified stage: Secondary | ICD-10-CM | POA: Diagnosis not present

## 2021-08-18 DIAGNOSIS — R55 Syncope and collapse: Secondary | ICD-10-CM | POA: Diagnosis not present

## 2021-08-18 DIAGNOSIS — N186 End stage renal disease: Secondary | ICD-10-CM | POA: Diagnosis not present

## 2021-08-21 DIAGNOSIS — R197 Diarrhea, unspecified: Secondary | ICD-10-CM | POA: Diagnosis not present

## 2021-08-21 DIAGNOSIS — N2581 Secondary hyperparathyroidism of renal origin: Secondary | ICD-10-CM | POA: Diagnosis not present

## 2021-08-21 DIAGNOSIS — S82143A Displaced bicondylar fracture of unspecified tibia, initial encounter for closed fracture: Secondary | ICD-10-CM | POA: Diagnosis not present

## 2021-08-21 DIAGNOSIS — J449 Chronic obstructive pulmonary disease, unspecified: Secondary | ICD-10-CM | POA: Diagnosis not present

## 2021-08-21 DIAGNOSIS — I87332 Chronic venous hypertension (idiopathic) with ulcer and inflammation of left lower extremity: Secondary | ICD-10-CM | POA: Diagnosis not present

## 2021-08-21 DIAGNOSIS — D631 Anemia in chronic kidney disease: Secondary | ICD-10-CM | POA: Diagnosis not present

## 2021-08-21 DIAGNOSIS — R55 Syncope and collapse: Secondary | ICD-10-CM | POA: Diagnosis not present

## 2021-08-21 DIAGNOSIS — R278 Other lack of coordination: Secondary | ICD-10-CM | POA: Diagnosis not present

## 2021-08-21 DIAGNOSIS — M10441 Other secondary gout, right hand: Secondary | ICD-10-CM | POA: Diagnosis not present

## 2021-08-21 DIAGNOSIS — L8990 Pressure ulcer of unspecified site, unspecified stage: Secondary | ICD-10-CM | POA: Diagnosis not present

## 2021-08-21 DIAGNOSIS — M6281 Muscle weakness (generalized): Secondary | ICD-10-CM | POA: Diagnosis not present

## 2021-08-21 DIAGNOSIS — S43006A Unspecified dislocation of unspecified shoulder joint, initial encounter: Secondary | ICD-10-CM | POA: Diagnosis not present

## 2021-08-21 DIAGNOSIS — Z992 Dependence on renal dialysis: Secondary | ICD-10-CM | POA: Diagnosis not present

## 2021-08-21 DIAGNOSIS — R488 Other symbolic dysfunctions: Secondary | ICD-10-CM | POA: Diagnosis not present

## 2021-08-21 DIAGNOSIS — D509 Iron deficiency anemia, unspecified: Secondary | ICD-10-CM | POA: Diagnosis not present

## 2021-08-21 DIAGNOSIS — E1122 Type 2 diabetes mellitus with diabetic chronic kidney disease: Secondary | ICD-10-CM | POA: Diagnosis not present

## 2021-08-21 DIAGNOSIS — R262 Difficulty in walking, not elsewhere classified: Secondary | ICD-10-CM | POA: Diagnosis not present

## 2021-08-21 DIAGNOSIS — N186 End stage renal disease: Secondary | ICD-10-CM | POA: Diagnosis not present

## 2021-08-21 DIAGNOSIS — S92911A Unspecified fracture of right toe(s), initial encounter for closed fracture: Secondary | ICD-10-CM | POA: Diagnosis not present

## 2021-08-22 DIAGNOSIS — G8911 Acute pain due to trauma: Secondary | ICD-10-CM | POA: Diagnosis not present

## 2021-08-22 DIAGNOSIS — R278 Other lack of coordination: Secondary | ICD-10-CM | POA: Diagnosis not present

## 2021-08-22 DIAGNOSIS — R488 Other symbolic dysfunctions: Secondary | ICD-10-CM | POA: Diagnosis not present

## 2021-08-22 DIAGNOSIS — S43006A Unspecified dislocation of unspecified shoulder joint, initial encounter: Secondary | ICD-10-CM | POA: Diagnosis not present

## 2021-08-22 DIAGNOSIS — R262 Difficulty in walking, not elsewhere classified: Secondary | ICD-10-CM | POA: Diagnosis not present

## 2021-08-22 DIAGNOSIS — M25512 Pain in left shoulder: Secondary | ICD-10-CM | POA: Diagnosis not present

## 2021-08-22 DIAGNOSIS — S82143A Displaced bicondylar fracture of unspecified tibia, initial encounter for closed fracture: Secondary | ICD-10-CM | POA: Diagnosis not present

## 2021-08-22 DIAGNOSIS — J449 Chronic obstructive pulmonary disease, unspecified: Secondary | ICD-10-CM | POA: Diagnosis not present

## 2021-08-22 DIAGNOSIS — R55 Syncope and collapse: Secondary | ICD-10-CM | POA: Diagnosis not present

## 2021-08-22 DIAGNOSIS — S92911A Unspecified fracture of right toe(s), initial encounter for closed fracture: Secondary | ICD-10-CM | POA: Diagnosis not present

## 2021-08-22 DIAGNOSIS — L8990 Pressure ulcer of unspecified site, unspecified stage: Secondary | ICD-10-CM | POA: Diagnosis not present

## 2021-08-22 DIAGNOSIS — M6281 Muscle weakness (generalized): Secondary | ICD-10-CM | POA: Diagnosis not present

## 2021-08-22 DIAGNOSIS — I87332 Chronic venous hypertension (idiopathic) with ulcer and inflammation of left lower extremity: Secondary | ICD-10-CM | POA: Diagnosis not present

## 2021-08-22 DIAGNOSIS — M79644 Pain in right finger(s): Secondary | ICD-10-CM | POA: Diagnosis not present

## 2021-08-23 DIAGNOSIS — N2581 Secondary hyperparathyroidism of renal origin: Secondary | ICD-10-CM | POA: Diagnosis not present

## 2021-08-23 DIAGNOSIS — D631 Anemia in chronic kidney disease: Secondary | ICD-10-CM | POA: Diagnosis not present

## 2021-08-23 DIAGNOSIS — D509 Iron deficiency anemia, unspecified: Secondary | ICD-10-CM | POA: Diagnosis not present

## 2021-08-23 DIAGNOSIS — N186 End stage renal disease: Secondary | ICD-10-CM | POA: Diagnosis not present

## 2021-08-23 DIAGNOSIS — R197 Diarrhea, unspecified: Secondary | ICD-10-CM | POA: Diagnosis not present

## 2021-08-23 DIAGNOSIS — E1122 Type 2 diabetes mellitus with diabetic chronic kidney disease: Secondary | ICD-10-CM | POA: Diagnosis not present

## 2021-08-23 DIAGNOSIS — M10441 Other secondary gout, right hand: Secondary | ICD-10-CM | POA: Diagnosis not present

## 2021-08-23 DIAGNOSIS — Z992 Dependence on renal dialysis: Secondary | ICD-10-CM | POA: Diagnosis not present

## 2021-08-24 ENCOUNTER — Encounter (INDEPENDENT_AMBULATORY_CARE_PROVIDER_SITE_OTHER): Payer: Medicare Other | Admitting: Ophthalmology

## 2021-08-24 DIAGNOSIS — H35033 Hypertensive retinopathy, bilateral: Secondary | ICD-10-CM | POA: Diagnosis not present

## 2021-08-24 DIAGNOSIS — E113513 Type 2 diabetes mellitus with proliferative diabetic retinopathy with macular edema, bilateral: Secondary | ICD-10-CM

## 2021-08-24 DIAGNOSIS — S82143A Displaced bicondylar fracture of unspecified tibia, initial encounter for closed fracture: Secondary | ICD-10-CM | POA: Diagnosis not present

## 2021-08-24 DIAGNOSIS — R262 Difficulty in walking, not elsewhere classified: Secondary | ICD-10-CM | POA: Diagnosis not present

## 2021-08-24 DIAGNOSIS — I87332 Chronic venous hypertension (idiopathic) with ulcer and inflammation of left lower extremity: Secondary | ICD-10-CM | POA: Diagnosis not present

## 2021-08-24 DIAGNOSIS — L8990 Pressure ulcer of unspecified site, unspecified stage: Secondary | ICD-10-CM | POA: Diagnosis not present

## 2021-08-24 DIAGNOSIS — S43006A Unspecified dislocation of unspecified shoulder joint, initial encounter: Secondary | ICD-10-CM | POA: Diagnosis not present

## 2021-08-24 DIAGNOSIS — I10 Essential (primary) hypertension: Secondary | ICD-10-CM | POA: Diagnosis not present

## 2021-08-24 DIAGNOSIS — R488 Other symbolic dysfunctions: Secondary | ICD-10-CM | POA: Diagnosis not present

## 2021-08-24 DIAGNOSIS — R278 Other lack of coordination: Secondary | ICD-10-CM | POA: Diagnosis not present

## 2021-08-24 DIAGNOSIS — M6281 Muscle weakness (generalized): Secondary | ICD-10-CM | POA: Diagnosis not present

## 2021-08-24 DIAGNOSIS — S92911A Unspecified fracture of right toe(s), initial encounter for closed fracture: Secondary | ICD-10-CM | POA: Diagnosis not present

## 2021-08-24 DIAGNOSIS — R55 Syncope and collapse: Secondary | ICD-10-CM | POA: Diagnosis not present

## 2021-08-24 DIAGNOSIS — H43813 Vitreous degeneration, bilateral: Secondary | ICD-10-CM

## 2021-08-24 DIAGNOSIS — J449 Chronic obstructive pulmonary disease, unspecified: Secondary | ICD-10-CM | POA: Diagnosis not present

## 2021-08-25 DIAGNOSIS — Z992 Dependence on renal dialysis: Secondary | ICD-10-CM | POA: Diagnosis not present

## 2021-08-25 DIAGNOSIS — E1122 Type 2 diabetes mellitus with diabetic chronic kidney disease: Secondary | ICD-10-CM | POA: Diagnosis not present

## 2021-08-25 DIAGNOSIS — N2581 Secondary hyperparathyroidism of renal origin: Secondary | ICD-10-CM | POA: Diagnosis not present

## 2021-08-25 DIAGNOSIS — R197 Diarrhea, unspecified: Secondary | ICD-10-CM | POA: Diagnosis not present

## 2021-08-25 DIAGNOSIS — M10441 Other secondary gout, right hand: Secondary | ICD-10-CM | POA: Diagnosis not present

## 2021-08-25 DIAGNOSIS — D509 Iron deficiency anemia, unspecified: Secondary | ICD-10-CM | POA: Diagnosis not present

## 2021-08-25 DIAGNOSIS — N186 End stage renal disease: Secondary | ICD-10-CM | POA: Diagnosis not present

## 2021-08-25 DIAGNOSIS — D631 Anemia in chronic kidney disease: Secondary | ICD-10-CM | POA: Diagnosis not present

## 2021-08-28 DIAGNOSIS — M10441 Other secondary gout, right hand: Secondary | ICD-10-CM | POA: Diagnosis not present

## 2021-08-28 DIAGNOSIS — N2581 Secondary hyperparathyroidism of renal origin: Secondary | ICD-10-CM | POA: Diagnosis not present

## 2021-08-28 DIAGNOSIS — E1122 Type 2 diabetes mellitus with diabetic chronic kidney disease: Secondary | ICD-10-CM | POA: Diagnosis not present

## 2021-08-28 DIAGNOSIS — D631 Anemia in chronic kidney disease: Secondary | ICD-10-CM | POA: Diagnosis not present

## 2021-08-28 DIAGNOSIS — D509 Iron deficiency anemia, unspecified: Secondary | ICD-10-CM | POA: Diagnosis not present

## 2021-08-28 DIAGNOSIS — R197 Diarrhea, unspecified: Secondary | ICD-10-CM | POA: Diagnosis not present

## 2021-08-28 DIAGNOSIS — Z992 Dependence on renal dialysis: Secondary | ICD-10-CM | POA: Diagnosis not present

## 2021-08-28 DIAGNOSIS — N186 End stage renal disease: Secondary | ICD-10-CM | POA: Diagnosis not present

## 2021-08-29 DIAGNOSIS — S92911A Unspecified fracture of right toe(s), initial encounter for closed fracture: Secondary | ICD-10-CM | POA: Diagnosis not present

## 2021-08-29 DIAGNOSIS — I10 Essential (primary) hypertension: Secondary | ICD-10-CM | POA: Diagnosis not present

## 2021-08-29 DIAGNOSIS — M6281 Muscle weakness (generalized): Secondary | ICD-10-CM | POA: Diagnosis not present

## 2021-08-29 DIAGNOSIS — R55 Syncope and collapse: Secondary | ICD-10-CM | POA: Diagnosis not present

## 2021-08-29 DIAGNOSIS — E1122 Type 2 diabetes mellitus with diabetic chronic kidney disease: Secondary | ICD-10-CM | POA: Diagnosis not present

## 2021-08-29 DIAGNOSIS — G4700 Insomnia, unspecified: Secondary | ICD-10-CM | POA: Diagnosis not present

## 2021-08-29 DIAGNOSIS — J449 Chronic obstructive pulmonary disease, unspecified: Secondary | ICD-10-CM | POA: Diagnosis not present

## 2021-08-29 DIAGNOSIS — R488 Other symbolic dysfunctions: Secondary | ICD-10-CM | POA: Diagnosis not present

## 2021-08-29 DIAGNOSIS — L8990 Pressure ulcer of unspecified site, unspecified stage: Secondary | ICD-10-CM | POA: Diagnosis not present

## 2021-08-29 DIAGNOSIS — G629 Polyneuropathy, unspecified: Secondary | ICD-10-CM | POA: Diagnosis not present

## 2021-08-29 DIAGNOSIS — R278 Other lack of coordination: Secondary | ICD-10-CM | POA: Diagnosis not present

## 2021-08-29 DIAGNOSIS — S82143A Displaced bicondylar fracture of unspecified tibia, initial encounter for closed fracture: Secondary | ICD-10-CM | POA: Diagnosis not present

## 2021-08-29 DIAGNOSIS — I87332 Chronic venous hypertension (idiopathic) with ulcer and inflammation of left lower extremity: Secondary | ICD-10-CM | POA: Diagnosis not present

## 2021-08-29 DIAGNOSIS — S43006A Unspecified dislocation of unspecified shoulder joint, initial encounter: Secondary | ICD-10-CM | POA: Diagnosis not present

## 2021-08-29 DIAGNOSIS — N186 End stage renal disease: Secondary | ICD-10-CM | POA: Diagnosis not present

## 2021-08-29 DIAGNOSIS — E785 Hyperlipidemia, unspecified: Secondary | ICD-10-CM | POA: Diagnosis not present

## 2021-08-29 DIAGNOSIS — G459 Transient cerebral ischemic attack, unspecified: Secondary | ICD-10-CM | POA: Diagnosis not present

## 2021-08-29 DIAGNOSIS — R262 Difficulty in walking, not elsewhere classified: Secondary | ICD-10-CM | POA: Diagnosis not present

## 2021-08-30 DIAGNOSIS — R55 Syncope and collapse: Secondary | ICD-10-CM | POA: Diagnosis not present

## 2021-08-30 DIAGNOSIS — I87332 Chronic venous hypertension (idiopathic) with ulcer and inflammation of left lower extremity: Secondary | ICD-10-CM | POA: Diagnosis not present

## 2021-08-30 DIAGNOSIS — R197 Diarrhea, unspecified: Secondary | ICD-10-CM | POA: Diagnosis not present

## 2021-08-30 DIAGNOSIS — S82143A Displaced bicondylar fracture of unspecified tibia, initial encounter for closed fracture: Secondary | ICD-10-CM | POA: Diagnosis not present

## 2021-08-30 DIAGNOSIS — N186 End stage renal disease: Secondary | ICD-10-CM | POA: Diagnosis not present

## 2021-08-30 DIAGNOSIS — S92911A Unspecified fracture of right toe(s), initial encounter for closed fracture: Secondary | ICD-10-CM | POA: Diagnosis not present

## 2021-08-30 DIAGNOSIS — M6281 Muscle weakness (generalized): Secondary | ICD-10-CM | POA: Diagnosis not present

## 2021-08-30 DIAGNOSIS — E1122 Type 2 diabetes mellitus with diabetic chronic kidney disease: Secondary | ICD-10-CM | POA: Diagnosis not present

## 2021-08-30 DIAGNOSIS — L8990 Pressure ulcer of unspecified site, unspecified stage: Secondary | ICD-10-CM | POA: Diagnosis not present

## 2021-08-30 DIAGNOSIS — D631 Anemia in chronic kidney disease: Secondary | ICD-10-CM | POA: Diagnosis not present

## 2021-08-30 DIAGNOSIS — M10441 Other secondary gout, right hand: Secondary | ICD-10-CM | POA: Diagnosis not present

## 2021-08-30 DIAGNOSIS — J449 Chronic obstructive pulmonary disease, unspecified: Secondary | ICD-10-CM | POA: Diagnosis not present

## 2021-08-30 DIAGNOSIS — D509 Iron deficiency anemia, unspecified: Secondary | ICD-10-CM | POA: Diagnosis not present

## 2021-08-30 DIAGNOSIS — R488 Other symbolic dysfunctions: Secondary | ICD-10-CM | POA: Diagnosis not present

## 2021-08-30 DIAGNOSIS — R278 Other lack of coordination: Secondary | ICD-10-CM | POA: Diagnosis not present

## 2021-08-30 DIAGNOSIS — Z992 Dependence on renal dialysis: Secondary | ICD-10-CM | POA: Diagnosis not present

## 2021-08-30 DIAGNOSIS — N2581 Secondary hyperparathyroidism of renal origin: Secondary | ICD-10-CM | POA: Diagnosis not present

## 2021-08-30 DIAGNOSIS — R262 Difficulty in walking, not elsewhere classified: Secondary | ICD-10-CM | POA: Diagnosis not present

## 2021-08-30 DIAGNOSIS — S43006A Unspecified dislocation of unspecified shoulder joint, initial encounter: Secondary | ICD-10-CM | POA: Diagnosis not present

## 2021-08-31 DIAGNOSIS — S43006A Unspecified dislocation of unspecified shoulder joint, initial encounter: Secondary | ICD-10-CM | POA: Diagnosis not present

## 2021-08-31 DIAGNOSIS — S92911A Unspecified fracture of right toe(s), initial encounter for closed fracture: Secondary | ICD-10-CM | POA: Diagnosis not present

## 2021-08-31 DIAGNOSIS — R278 Other lack of coordination: Secondary | ICD-10-CM | POA: Diagnosis not present

## 2021-08-31 DIAGNOSIS — L8990 Pressure ulcer of unspecified site, unspecified stage: Secondary | ICD-10-CM | POA: Diagnosis not present

## 2021-08-31 DIAGNOSIS — I87332 Chronic venous hypertension (idiopathic) with ulcer and inflammation of left lower extremity: Secondary | ICD-10-CM | POA: Diagnosis not present

## 2021-08-31 DIAGNOSIS — M6281 Muscle weakness (generalized): Secondary | ICD-10-CM | POA: Diagnosis not present

## 2021-08-31 DIAGNOSIS — J449 Chronic obstructive pulmonary disease, unspecified: Secondary | ICD-10-CM | POA: Diagnosis not present

## 2021-08-31 DIAGNOSIS — R262 Difficulty in walking, not elsewhere classified: Secondary | ICD-10-CM | POA: Diagnosis not present

## 2021-08-31 DIAGNOSIS — S82143A Displaced bicondylar fracture of unspecified tibia, initial encounter for closed fracture: Secondary | ICD-10-CM | POA: Diagnosis not present

## 2021-08-31 DIAGNOSIS — R488 Other symbolic dysfunctions: Secondary | ICD-10-CM | POA: Diagnosis not present

## 2021-08-31 DIAGNOSIS — R55 Syncope and collapse: Secondary | ICD-10-CM | POA: Diagnosis not present

## 2021-09-01 DIAGNOSIS — N2581 Secondary hyperparathyroidism of renal origin: Secondary | ICD-10-CM | POA: Diagnosis not present

## 2021-09-01 DIAGNOSIS — M10441 Other secondary gout, right hand: Secondary | ICD-10-CM | POA: Diagnosis not present

## 2021-09-01 DIAGNOSIS — N186 End stage renal disease: Secondary | ICD-10-CM | POA: Diagnosis not present

## 2021-09-01 DIAGNOSIS — E1122 Type 2 diabetes mellitus with diabetic chronic kidney disease: Secondary | ICD-10-CM | POA: Diagnosis not present

## 2021-09-01 DIAGNOSIS — Z992 Dependence on renal dialysis: Secondary | ICD-10-CM | POA: Diagnosis not present

## 2021-09-01 DIAGNOSIS — D631 Anemia in chronic kidney disease: Secondary | ICD-10-CM | POA: Diagnosis not present

## 2021-09-01 DIAGNOSIS — R197 Diarrhea, unspecified: Secondary | ICD-10-CM | POA: Diagnosis not present

## 2021-09-01 DIAGNOSIS — D509 Iron deficiency anemia, unspecified: Secondary | ICD-10-CM | POA: Diagnosis not present

## 2021-09-04 DIAGNOSIS — Z992 Dependence on renal dialysis: Secondary | ICD-10-CM | POA: Diagnosis not present

## 2021-09-04 DIAGNOSIS — R197 Diarrhea, unspecified: Secondary | ICD-10-CM | POA: Diagnosis not present

## 2021-09-04 DIAGNOSIS — N2581 Secondary hyperparathyroidism of renal origin: Secondary | ICD-10-CM | POA: Diagnosis not present

## 2021-09-04 DIAGNOSIS — D509 Iron deficiency anemia, unspecified: Secondary | ICD-10-CM | POA: Diagnosis not present

## 2021-09-04 DIAGNOSIS — D631 Anemia in chronic kidney disease: Secondary | ICD-10-CM | POA: Diagnosis not present

## 2021-09-04 DIAGNOSIS — M10441 Other secondary gout, right hand: Secondary | ICD-10-CM | POA: Diagnosis not present

## 2021-09-04 DIAGNOSIS — N186 End stage renal disease: Secondary | ICD-10-CM | POA: Diagnosis not present

## 2021-09-04 DIAGNOSIS — E1122 Type 2 diabetes mellitus with diabetic chronic kidney disease: Secondary | ICD-10-CM | POA: Diagnosis not present

## 2021-09-05 DIAGNOSIS — R55 Syncope and collapse: Secondary | ICD-10-CM | POA: Diagnosis not present

## 2021-09-05 DIAGNOSIS — S92911A Unspecified fracture of right toe(s), initial encounter for closed fracture: Secondary | ICD-10-CM | POA: Diagnosis not present

## 2021-09-05 DIAGNOSIS — E559 Vitamin D deficiency, unspecified: Secondary | ICD-10-CM | POA: Diagnosis not present

## 2021-09-05 DIAGNOSIS — S43006A Unspecified dislocation of unspecified shoulder joint, initial encounter: Secondary | ICD-10-CM | POA: Diagnosis not present

## 2021-09-05 DIAGNOSIS — J449 Chronic obstructive pulmonary disease, unspecified: Secondary | ICD-10-CM | POA: Diagnosis not present

## 2021-09-05 DIAGNOSIS — R488 Other symbolic dysfunctions: Secondary | ICD-10-CM | POA: Diagnosis not present

## 2021-09-05 DIAGNOSIS — I10 Essential (primary) hypertension: Secondary | ICD-10-CM | POA: Diagnosis not present

## 2021-09-05 DIAGNOSIS — R278 Other lack of coordination: Secondary | ICD-10-CM | POA: Diagnosis not present

## 2021-09-05 DIAGNOSIS — R262 Difficulty in walking, not elsewhere classified: Secondary | ICD-10-CM | POA: Diagnosis not present

## 2021-09-05 DIAGNOSIS — N186 End stage renal disease: Secondary | ICD-10-CM | POA: Diagnosis not present

## 2021-09-05 DIAGNOSIS — S82143A Displaced bicondylar fracture of unspecified tibia, initial encounter for closed fracture: Secondary | ICD-10-CM | POA: Diagnosis not present

## 2021-09-05 DIAGNOSIS — L8990 Pressure ulcer of unspecified site, unspecified stage: Secondary | ICD-10-CM | POA: Diagnosis not present

## 2021-09-05 DIAGNOSIS — I87332 Chronic venous hypertension (idiopathic) with ulcer and inflammation of left lower extremity: Secondary | ICD-10-CM | POA: Diagnosis not present

## 2021-09-05 DIAGNOSIS — E0801 Diabetes mellitus due to underlying condition with hyperosmolarity with coma: Secondary | ICD-10-CM | POA: Diagnosis not present

## 2021-09-05 DIAGNOSIS — M6281 Muscle weakness (generalized): Secondary | ICD-10-CM | POA: Diagnosis not present

## 2021-09-06 DIAGNOSIS — D509 Iron deficiency anemia, unspecified: Secondary | ICD-10-CM | POA: Diagnosis not present

## 2021-09-06 DIAGNOSIS — R488 Other symbolic dysfunctions: Secondary | ICD-10-CM | POA: Diagnosis not present

## 2021-09-06 DIAGNOSIS — R197 Diarrhea, unspecified: Secondary | ICD-10-CM | POA: Diagnosis not present

## 2021-09-06 DIAGNOSIS — L8990 Pressure ulcer of unspecified site, unspecified stage: Secondary | ICD-10-CM | POA: Diagnosis not present

## 2021-09-06 DIAGNOSIS — S82143A Displaced bicondylar fracture of unspecified tibia, initial encounter for closed fracture: Secondary | ICD-10-CM | POA: Diagnosis not present

## 2021-09-06 DIAGNOSIS — E1122 Type 2 diabetes mellitus with diabetic chronic kidney disease: Secondary | ICD-10-CM | POA: Diagnosis not present

## 2021-09-06 DIAGNOSIS — N2581 Secondary hyperparathyroidism of renal origin: Secondary | ICD-10-CM | POA: Diagnosis not present

## 2021-09-06 DIAGNOSIS — R55 Syncope and collapse: Secondary | ICD-10-CM | POA: Diagnosis not present

## 2021-09-06 DIAGNOSIS — I87332 Chronic venous hypertension (idiopathic) with ulcer and inflammation of left lower extremity: Secondary | ICD-10-CM | POA: Diagnosis not present

## 2021-09-06 DIAGNOSIS — N186 End stage renal disease: Secondary | ICD-10-CM | POA: Diagnosis not present

## 2021-09-06 DIAGNOSIS — M6281 Muscle weakness (generalized): Secondary | ICD-10-CM | POA: Diagnosis not present

## 2021-09-06 DIAGNOSIS — M10441 Other secondary gout, right hand: Secondary | ICD-10-CM | POA: Diagnosis not present

## 2021-09-06 DIAGNOSIS — J449 Chronic obstructive pulmonary disease, unspecified: Secondary | ICD-10-CM | POA: Diagnosis not present

## 2021-09-06 DIAGNOSIS — S43006A Unspecified dislocation of unspecified shoulder joint, initial encounter: Secondary | ICD-10-CM | POA: Diagnosis not present

## 2021-09-06 DIAGNOSIS — R262 Difficulty in walking, not elsewhere classified: Secondary | ICD-10-CM | POA: Diagnosis not present

## 2021-09-06 DIAGNOSIS — Z992 Dependence on renal dialysis: Secondary | ICD-10-CM | POA: Diagnosis not present

## 2021-09-06 DIAGNOSIS — S92911A Unspecified fracture of right toe(s), initial encounter for closed fracture: Secondary | ICD-10-CM | POA: Diagnosis not present

## 2021-09-06 DIAGNOSIS — D631 Anemia in chronic kidney disease: Secondary | ICD-10-CM | POA: Diagnosis not present

## 2021-09-06 DIAGNOSIS — R278 Other lack of coordination: Secondary | ICD-10-CM | POA: Diagnosis not present

## 2021-09-07 DIAGNOSIS — R278 Other lack of coordination: Secondary | ICD-10-CM | POA: Diagnosis not present

## 2021-09-07 DIAGNOSIS — Z992 Dependence on renal dialysis: Secondary | ICD-10-CM | POA: Diagnosis not present

## 2021-09-07 DIAGNOSIS — S82143A Displaced bicondylar fracture of unspecified tibia, initial encounter for closed fracture: Secondary | ICD-10-CM | POA: Diagnosis not present

## 2021-09-07 DIAGNOSIS — E1122 Type 2 diabetes mellitus with diabetic chronic kidney disease: Secondary | ICD-10-CM | POA: Diagnosis not present

## 2021-09-07 DIAGNOSIS — S92911A Unspecified fracture of right toe(s), initial encounter for closed fracture: Secondary | ICD-10-CM | POA: Diagnosis not present

## 2021-09-07 DIAGNOSIS — R488 Other symbolic dysfunctions: Secondary | ICD-10-CM | POA: Diagnosis not present

## 2021-09-07 DIAGNOSIS — I87332 Chronic venous hypertension (idiopathic) with ulcer and inflammation of left lower extremity: Secondary | ICD-10-CM | POA: Diagnosis not present

## 2021-09-07 DIAGNOSIS — M6281 Muscle weakness (generalized): Secondary | ICD-10-CM | POA: Diagnosis not present

## 2021-09-07 DIAGNOSIS — L8990 Pressure ulcer of unspecified site, unspecified stage: Secondary | ICD-10-CM | POA: Diagnosis not present

## 2021-09-07 DIAGNOSIS — R55 Syncope and collapse: Secondary | ICD-10-CM | POA: Diagnosis not present

## 2021-09-07 DIAGNOSIS — S43006A Unspecified dislocation of unspecified shoulder joint, initial encounter: Secondary | ICD-10-CM | POA: Diagnosis not present

## 2021-09-07 DIAGNOSIS — J449 Chronic obstructive pulmonary disease, unspecified: Secondary | ICD-10-CM | POA: Diagnosis not present

## 2021-09-07 DIAGNOSIS — R262 Difficulty in walking, not elsewhere classified: Secondary | ICD-10-CM | POA: Diagnosis not present

## 2021-09-07 DIAGNOSIS — N186 End stage renal disease: Secondary | ICD-10-CM | POA: Diagnosis not present

## 2021-09-08 DIAGNOSIS — J449 Chronic obstructive pulmonary disease, unspecified: Secondary | ICD-10-CM | POA: Diagnosis not present

## 2021-09-08 DIAGNOSIS — R278 Other lack of coordination: Secondary | ICD-10-CM | POA: Diagnosis not present

## 2021-09-08 DIAGNOSIS — L8990 Pressure ulcer of unspecified site, unspecified stage: Secondary | ICD-10-CM | POA: Diagnosis not present

## 2021-09-08 DIAGNOSIS — N2581 Secondary hyperparathyroidism of renal origin: Secondary | ICD-10-CM | POA: Diagnosis not present

## 2021-09-08 DIAGNOSIS — D631 Anemia in chronic kidney disease: Secondary | ICD-10-CM | POA: Diagnosis not present

## 2021-09-08 DIAGNOSIS — N186 End stage renal disease: Secondary | ICD-10-CM | POA: Diagnosis not present

## 2021-09-08 DIAGNOSIS — M6281 Muscle weakness (generalized): Secondary | ICD-10-CM | POA: Diagnosis not present

## 2021-09-08 DIAGNOSIS — S92911A Unspecified fracture of right toe(s), initial encounter for closed fracture: Secondary | ICD-10-CM | POA: Diagnosis not present

## 2021-09-08 DIAGNOSIS — E1122 Type 2 diabetes mellitus with diabetic chronic kidney disease: Secondary | ICD-10-CM | POA: Diagnosis not present

## 2021-09-08 DIAGNOSIS — G459 Transient cerebral ischemic attack, unspecified: Secondary | ICD-10-CM | POA: Diagnosis not present

## 2021-09-08 DIAGNOSIS — S82143A Displaced bicondylar fracture of unspecified tibia, initial encounter for closed fracture: Secondary | ICD-10-CM | POA: Diagnosis not present

## 2021-09-08 DIAGNOSIS — I629 Nontraumatic intracranial hemorrhage, unspecified: Secondary | ICD-10-CM | POA: Diagnosis not present

## 2021-09-08 DIAGNOSIS — R488 Other symbolic dysfunctions: Secondary | ICD-10-CM | POA: Diagnosis not present

## 2021-09-08 DIAGNOSIS — R55 Syncope and collapse: Secondary | ICD-10-CM | POA: Diagnosis not present

## 2021-09-08 DIAGNOSIS — T7840XS Allergy, unspecified, sequela: Secondary | ICD-10-CM | POA: Diagnosis not present

## 2021-09-08 DIAGNOSIS — R197 Diarrhea, unspecified: Secondary | ICD-10-CM | POA: Diagnosis not present

## 2021-09-08 DIAGNOSIS — R262 Difficulty in walking, not elsewhere classified: Secondary | ICD-10-CM | POA: Diagnosis not present

## 2021-09-08 DIAGNOSIS — I87332 Chronic venous hypertension (idiopathic) with ulcer and inflammation of left lower extremity: Secondary | ICD-10-CM | POA: Diagnosis not present

## 2021-09-08 DIAGNOSIS — Z992 Dependence on renal dialysis: Secondary | ICD-10-CM | POA: Diagnosis not present

## 2021-09-08 DIAGNOSIS — S43006A Unspecified dislocation of unspecified shoulder joint, initial encounter: Secondary | ICD-10-CM | POA: Diagnosis not present

## 2021-09-11 DIAGNOSIS — N2581 Secondary hyperparathyroidism of renal origin: Secondary | ICD-10-CM | POA: Diagnosis not present

## 2021-09-11 DIAGNOSIS — R278 Other lack of coordination: Secondary | ICD-10-CM | POA: Diagnosis not present

## 2021-09-11 DIAGNOSIS — G459 Transient cerebral ischemic attack, unspecified: Secondary | ICD-10-CM | POA: Diagnosis not present

## 2021-09-11 DIAGNOSIS — R55 Syncope and collapse: Secondary | ICD-10-CM | POA: Diagnosis not present

## 2021-09-11 DIAGNOSIS — J449 Chronic obstructive pulmonary disease, unspecified: Secondary | ICD-10-CM | POA: Diagnosis not present

## 2021-09-11 DIAGNOSIS — Z992 Dependence on renal dialysis: Secondary | ICD-10-CM | POA: Diagnosis not present

## 2021-09-11 DIAGNOSIS — T7840XS Allergy, unspecified, sequela: Secondary | ICD-10-CM | POA: Diagnosis not present

## 2021-09-11 DIAGNOSIS — N186 End stage renal disease: Secondary | ICD-10-CM | POA: Diagnosis not present

## 2021-09-11 DIAGNOSIS — R488 Other symbolic dysfunctions: Secondary | ICD-10-CM | POA: Diagnosis not present

## 2021-09-11 DIAGNOSIS — E1122 Type 2 diabetes mellitus with diabetic chronic kidney disease: Secondary | ICD-10-CM | POA: Diagnosis not present

## 2021-09-11 DIAGNOSIS — S43006A Unspecified dislocation of unspecified shoulder joint, initial encounter: Secondary | ICD-10-CM | POA: Diagnosis not present

## 2021-09-11 DIAGNOSIS — S92911A Unspecified fracture of right toe(s), initial encounter for closed fracture: Secondary | ICD-10-CM | POA: Diagnosis not present

## 2021-09-11 DIAGNOSIS — I87332 Chronic venous hypertension (idiopathic) with ulcer and inflammation of left lower extremity: Secondary | ICD-10-CM | POA: Diagnosis not present

## 2021-09-11 DIAGNOSIS — M6281 Muscle weakness (generalized): Secondary | ICD-10-CM | POA: Diagnosis not present

## 2021-09-11 DIAGNOSIS — I629 Nontraumatic intracranial hemorrhage, unspecified: Secondary | ICD-10-CM | POA: Diagnosis not present

## 2021-09-11 DIAGNOSIS — D631 Anemia in chronic kidney disease: Secondary | ICD-10-CM | POA: Diagnosis not present

## 2021-09-11 DIAGNOSIS — L8990 Pressure ulcer of unspecified site, unspecified stage: Secondary | ICD-10-CM | POA: Diagnosis not present

## 2021-09-11 DIAGNOSIS — R197 Diarrhea, unspecified: Secondary | ICD-10-CM | POA: Diagnosis not present

## 2021-09-11 DIAGNOSIS — R262 Difficulty in walking, not elsewhere classified: Secondary | ICD-10-CM | POA: Diagnosis not present

## 2021-09-11 DIAGNOSIS — S82143A Displaced bicondylar fracture of unspecified tibia, initial encounter for closed fracture: Secondary | ICD-10-CM | POA: Diagnosis not present

## 2021-09-12 DIAGNOSIS — R55 Syncope and collapse: Secondary | ICD-10-CM | POA: Diagnosis not present

## 2021-09-12 DIAGNOSIS — R278 Other lack of coordination: Secondary | ICD-10-CM | POA: Diagnosis not present

## 2021-09-12 DIAGNOSIS — L8990 Pressure ulcer of unspecified site, unspecified stage: Secondary | ICD-10-CM | POA: Diagnosis not present

## 2021-09-12 DIAGNOSIS — M6281 Muscle weakness (generalized): Secondary | ICD-10-CM | POA: Diagnosis not present

## 2021-09-12 DIAGNOSIS — R488 Other symbolic dysfunctions: Secondary | ICD-10-CM | POA: Diagnosis not present

## 2021-09-12 DIAGNOSIS — R262 Difficulty in walking, not elsewhere classified: Secondary | ICD-10-CM | POA: Diagnosis not present

## 2021-09-12 DIAGNOSIS — J449 Chronic obstructive pulmonary disease, unspecified: Secondary | ICD-10-CM | POA: Diagnosis not present

## 2021-09-12 DIAGNOSIS — I87332 Chronic venous hypertension (idiopathic) with ulcer and inflammation of left lower extremity: Secondary | ICD-10-CM | POA: Diagnosis not present

## 2021-09-12 DIAGNOSIS — E1122 Type 2 diabetes mellitus with diabetic chronic kidney disease: Secondary | ICD-10-CM | POA: Diagnosis not present

## 2021-09-12 DIAGNOSIS — I629 Nontraumatic intracranial hemorrhage, unspecified: Secondary | ICD-10-CM | POA: Diagnosis not present

## 2021-09-12 DIAGNOSIS — S43006A Unspecified dislocation of unspecified shoulder joint, initial encounter: Secondary | ICD-10-CM | POA: Diagnosis not present

## 2021-09-12 DIAGNOSIS — S82143A Displaced bicondylar fracture of unspecified tibia, initial encounter for closed fracture: Secondary | ICD-10-CM | POA: Diagnosis not present

## 2021-09-12 DIAGNOSIS — G459 Transient cerebral ischemic attack, unspecified: Secondary | ICD-10-CM | POA: Diagnosis not present

## 2021-09-12 DIAGNOSIS — S92911A Unspecified fracture of right toe(s), initial encounter for closed fracture: Secondary | ICD-10-CM | POA: Diagnosis not present

## 2021-09-13 DIAGNOSIS — D631 Anemia in chronic kidney disease: Secondary | ICD-10-CM | POA: Diagnosis not present

## 2021-09-13 DIAGNOSIS — T7840XS Allergy, unspecified, sequela: Secondary | ICD-10-CM | POA: Diagnosis not present

## 2021-09-13 DIAGNOSIS — N186 End stage renal disease: Secondary | ICD-10-CM | POA: Diagnosis not present

## 2021-09-13 DIAGNOSIS — Z992 Dependence on renal dialysis: Secondary | ICD-10-CM | POA: Diagnosis not present

## 2021-09-13 DIAGNOSIS — E1122 Type 2 diabetes mellitus with diabetic chronic kidney disease: Secondary | ICD-10-CM | POA: Diagnosis not present

## 2021-09-13 DIAGNOSIS — R197 Diarrhea, unspecified: Secondary | ICD-10-CM | POA: Diagnosis not present

## 2021-09-13 DIAGNOSIS — N2581 Secondary hyperparathyroidism of renal origin: Secondary | ICD-10-CM | POA: Diagnosis not present

## 2021-09-14 DIAGNOSIS — I87332 Chronic venous hypertension (idiopathic) with ulcer and inflammation of left lower extremity: Secondary | ICD-10-CM | POA: Diagnosis not present

## 2021-09-14 DIAGNOSIS — I629 Nontraumatic intracranial hemorrhage, unspecified: Secondary | ICD-10-CM | POA: Diagnosis not present

## 2021-09-14 DIAGNOSIS — R278 Other lack of coordination: Secondary | ICD-10-CM | POA: Diagnosis not present

## 2021-09-14 DIAGNOSIS — M6281 Muscle weakness (generalized): Secondary | ICD-10-CM | POA: Diagnosis not present

## 2021-09-14 DIAGNOSIS — S82143A Displaced bicondylar fracture of unspecified tibia, initial encounter for closed fracture: Secondary | ICD-10-CM | POA: Diagnosis not present

## 2021-09-14 DIAGNOSIS — E1122 Type 2 diabetes mellitus with diabetic chronic kidney disease: Secondary | ICD-10-CM | POA: Diagnosis not present

## 2021-09-14 DIAGNOSIS — S92911A Unspecified fracture of right toe(s), initial encounter for closed fracture: Secondary | ICD-10-CM | POA: Diagnosis not present

## 2021-09-14 DIAGNOSIS — J449 Chronic obstructive pulmonary disease, unspecified: Secondary | ICD-10-CM | POA: Diagnosis not present

## 2021-09-14 DIAGNOSIS — R262 Difficulty in walking, not elsewhere classified: Secondary | ICD-10-CM | POA: Diagnosis not present

## 2021-09-14 DIAGNOSIS — L8990 Pressure ulcer of unspecified site, unspecified stage: Secondary | ICD-10-CM | POA: Diagnosis not present

## 2021-09-14 DIAGNOSIS — S43006A Unspecified dislocation of unspecified shoulder joint, initial encounter: Secondary | ICD-10-CM | POA: Diagnosis not present

## 2021-09-14 DIAGNOSIS — R55 Syncope and collapse: Secondary | ICD-10-CM | POA: Diagnosis not present

## 2021-09-14 DIAGNOSIS — G459 Transient cerebral ischemic attack, unspecified: Secondary | ICD-10-CM | POA: Diagnosis not present

## 2021-09-14 DIAGNOSIS — R488 Other symbolic dysfunctions: Secondary | ICD-10-CM | POA: Diagnosis not present

## 2021-09-15 DIAGNOSIS — N186 End stage renal disease: Secondary | ICD-10-CM | POA: Diagnosis not present

## 2021-09-15 DIAGNOSIS — T7840XS Allergy, unspecified, sequela: Secondary | ICD-10-CM | POA: Diagnosis not present

## 2021-09-15 DIAGNOSIS — N2581 Secondary hyperparathyroidism of renal origin: Secondary | ICD-10-CM | POA: Diagnosis not present

## 2021-09-15 DIAGNOSIS — R197 Diarrhea, unspecified: Secondary | ICD-10-CM | POA: Diagnosis not present

## 2021-09-15 DIAGNOSIS — E1122 Type 2 diabetes mellitus with diabetic chronic kidney disease: Secondary | ICD-10-CM | POA: Diagnosis not present

## 2021-09-15 DIAGNOSIS — D631 Anemia in chronic kidney disease: Secondary | ICD-10-CM | POA: Diagnosis not present

## 2021-09-15 DIAGNOSIS — Z992 Dependence on renal dialysis: Secondary | ICD-10-CM | POA: Diagnosis not present

## 2021-09-18 DIAGNOSIS — I10 Essential (primary) hypertension: Secondary | ICD-10-CM | POA: Diagnosis not present

## 2021-09-18 DIAGNOSIS — T7840XS Allergy, unspecified, sequela: Secondary | ICD-10-CM | POA: Diagnosis not present

## 2021-09-18 DIAGNOSIS — D631 Anemia in chronic kidney disease: Secondary | ICD-10-CM | POA: Diagnosis not present

## 2021-09-18 DIAGNOSIS — N2581 Secondary hyperparathyroidism of renal origin: Secondary | ICD-10-CM | POA: Diagnosis not present

## 2021-09-18 DIAGNOSIS — Z992 Dependence on renal dialysis: Secondary | ICD-10-CM | POA: Diagnosis not present

## 2021-09-18 DIAGNOSIS — E1122 Type 2 diabetes mellitus with diabetic chronic kidney disease: Secondary | ICD-10-CM | POA: Diagnosis not present

## 2021-09-18 DIAGNOSIS — M7989 Other specified soft tissue disorders: Secondary | ICD-10-CM | POA: Diagnosis not present

## 2021-09-18 DIAGNOSIS — R197 Diarrhea, unspecified: Secondary | ICD-10-CM | POA: Diagnosis not present

## 2021-09-18 DIAGNOSIS — N186 End stage renal disease: Secondary | ICD-10-CM | POA: Diagnosis not present

## 2021-09-19 DIAGNOSIS — E1122 Type 2 diabetes mellitus with diabetic chronic kidney disease: Secondary | ICD-10-CM | POA: Diagnosis not present

## 2021-09-19 DIAGNOSIS — R278 Other lack of coordination: Secondary | ICD-10-CM | POA: Diagnosis not present

## 2021-09-19 DIAGNOSIS — Z89512 Acquired absence of left leg below knee: Secondary | ICD-10-CM | POA: Diagnosis not present

## 2021-09-19 DIAGNOSIS — J449 Chronic obstructive pulmonary disease, unspecified: Secondary | ICD-10-CM | POA: Diagnosis not present

## 2021-09-19 DIAGNOSIS — R262 Difficulty in walking, not elsewhere classified: Secondary | ICD-10-CM | POA: Diagnosis not present

## 2021-09-19 DIAGNOSIS — R488 Other symbolic dysfunctions: Secondary | ICD-10-CM | POA: Diagnosis not present

## 2021-09-19 DIAGNOSIS — S92911A Unspecified fracture of right toe(s), initial encounter for closed fracture: Secondary | ICD-10-CM | POA: Diagnosis not present

## 2021-09-19 DIAGNOSIS — R2232 Localized swelling, mass and lump, left upper limb: Secondary | ICD-10-CM | POA: Diagnosis not present

## 2021-09-19 DIAGNOSIS — E559 Vitamin D deficiency, unspecified: Secondary | ICD-10-CM | POA: Diagnosis not present

## 2021-09-19 DIAGNOSIS — S43006A Unspecified dislocation of unspecified shoulder joint, initial encounter: Secondary | ICD-10-CM | POA: Diagnosis not present

## 2021-09-19 DIAGNOSIS — I87332 Chronic venous hypertension (idiopathic) with ulcer and inflammation of left lower extremity: Secondary | ICD-10-CM | POA: Diagnosis not present

## 2021-09-19 DIAGNOSIS — Z89511 Acquired absence of right leg below knee: Secondary | ICD-10-CM | POA: Diagnosis not present

## 2021-09-19 DIAGNOSIS — S82143A Displaced bicondylar fracture of unspecified tibia, initial encounter for closed fracture: Secondary | ICD-10-CM | POA: Diagnosis not present

## 2021-09-19 DIAGNOSIS — G459 Transient cerebral ischemic attack, unspecified: Secondary | ICD-10-CM | POA: Diagnosis not present

## 2021-09-19 DIAGNOSIS — R55 Syncope and collapse: Secondary | ICD-10-CM | POA: Diagnosis not present

## 2021-09-19 DIAGNOSIS — E785 Hyperlipidemia, unspecified: Secondary | ICD-10-CM | POA: Diagnosis not present

## 2021-09-19 DIAGNOSIS — I629 Nontraumatic intracranial hemorrhage, unspecified: Secondary | ICD-10-CM | POA: Diagnosis not present

## 2021-09-19 DIAGNOSIS — L8990 Pressure ulcer of unspecified site, unspecified stage: Secondary | ICD-10-CM | POA: Diagnosis not present

## 2021-09-19 DIAGNOSIS — M6281 Muscle weakness (generalized): Secondary | ICD-10-CM | POA: Diagnosis not present

## 2021-09-20 DIAGNOSIS — T7840XS Allergy, unspecified, sequela: Secondary | ICD-10-CM | POA: Diagnosis not present

## 2021-09-20 DIAGNOSIS — E1122 Type 2 diabetes mellitus with diabetic chronic kidney disease: Secondary | ICD-10-CM | POA: Diagnosis not present

## 2021-09-20 DIAGNOSIS — D631 Anemia in chronic kidney disease: Secondary | ICD-10-CM | POA: Diagnosis not present

## 2021-09-20 DIAGNOSIS — N2581 Secondary hyperparathyroidism of renal origin: Secondary | ICD-10-CM | POA: Diagnosis not present

## 2021-09-20 DIAGNOSIS — N186 End stage renal disease: Secondary | ICD-10-CM | POA: Diagnosis not present

## 2021-09-20 DIAGNOSIS — Z992 Dependence on renal dialysis: Secondary | ICD-10-CM | POA: Diagnosis not present

## 2021-09-20 DIAGNOSIS — R197 Diarrhea, unspecified: Secondary | ICD-10-CM | POA: Diagnosis not present

## 2021-09-21 ENCOUNTER — Encounter (INDEPENDENT_AMBULATORY_CARE_PROVIDER_SITE_OTHER): Payer: Medicare Other | Admitting: Ophthalmology

## 2021-09-21 DIAGNOSIS — L8990 Pressure ulcer of unspecified site, unspecified stage: Secondary | ICD-10-CM | POA: Diagnosis not present

## 2021-09-21 DIAGNOSIS — I629 Nontraumatic intracranial hemorrhage, unspecified: Secondary | ICD-10-CM | POA: Diagnosis not present

## 2021-09-21 DIAGNOSIS — H43813 Vitreous degeneration, bilateral: Secondary | ICD-10-CM

## 2021-09-21 DIAGNOSIS — R55 Syncope and collapse: Secondary | ICD-10-CM | POA: Diagnosis not present

## 2021-09-21 DIAGNOSIS — S92911A Unspecified fracture of right toe(s), initial encounter for closed fracture: Secondary | ICD-10-CM | POA: Diagnosis not present

## 2021-09-21 DIAGNOSIS — G459 Transient cerebral ischemic attack, unspecified: Secondary | ICD-10-CM | POA: Diagnosis not present

## 2021-09-21 DIAGNOSIS — I10 Essential (primary) hypertension: Secondary | ICD-10-CM

## 2021-09-21 DIAGNOSIS — E1122 Type 2 diabetes mellitus with diabetic chronic kidney disease: Secondary | ICD-10-CM | POA: Diagnosis not present

## 2021-09-21 DIAGNOSIS — I87332 Chronic venous hypertension (idiopathic) with ulcer and inflammation of left lower extremity: Secondary | ICD-10-CM | POA: Diagnosis not present

## 2021-09-21 DIAGNOSIS — H35033 Hypertensive retinopathy, bilateral: Secondary | ICD-10-CM | POA: Diagnosis not present

## 2021-09-21 DIAGNOSIS — R488 Other symbolic dysfunctions: Secondary | ICD-10-CM | POA: Diagnosis not present

## 2021-09-21 DIAGNOSIS — R262 Difficulty in walking, not elsewhere classified: Secondary | ICD-10-CM | POA: Diagnosis not present

## 2021-09-21 DIAGNOSIS — S82143A Displaced bicondylar fracture of unspecified tibia, initial encounter for closed fracture: Secondary | ICD-10-CM | POA: Diagnosis not present

## 2021-09-21 DIAGNOSIS — M6281 Muscle weakness (generalized): Secondary | ICD-10-CM | POA: Diagnosis not present

## 2021-09-21 DIAGNOSIS — J449 Chronic obstructive pulmonary disease, unspecified: Secondary | ICD-10-CM | POA: Diagnosis not present

## 2021-09-21 DIAGNOSIS — E103513 Type 1 diabetes mellitus with proliferative diabetic retinopathy with macular edema, bilateral: Secondary | ICD-10-CM

## 2021-09-21 DIAGNOSIS — S43006A Unspecified dislocation of unspecified shoulder joint, initial encounter: Secondary | ICD-10-CM | POA: Diagnosis not present

## 2021-09-21 DIAGNOSIS — R278 Other lack of coordination: Secondary | ICD-10-CM | POA: Diagnosis not present

## 2021-09-22 DIAGNOSIS — E1122 Type 2 diabetes mellitus with diabetic chronic kidney disease: Secondary | ICD-10-CM | POA: Diagnosis not present

## 2021-09-22 DIAGNOSIS — N186 End stage renal disease: Secondary | ICD-10-CM | POA: Diagnosis not present

## 2021-09-22 DIAGNOSIS — Z992 Dependence on renal dialysis: Secondary | ICD-10-CM | POA: Diagnosis not present

## 2021-09-22 DIAGNOSIS — R197 Diarrhea, unspecified: Secondary | ICD-10-CM | POA: Diagnosis not present

## 2021-09-22 DIAGNOSIS — N2581 Secondary hyperparathyroidism of renal origin: Secondary | ICD-10-CM | POA: Diagnosis not present

## 2021-09-22 DIAGNOSIS — D631 Anemia in chronic kidney disease: Secondary | ICD-10-CM | POA: Diagnosis not present

## 2021-09-22 DIAGNOSIS — T7840XS Allergy, unspecified, sequela: Secondary | ICD-10-CM | POA: Diagnosis not present

## 2021-09-25 DIAGNOSIS — T7840XS Allergy, unspecified, sequela: Secondary | ICD-10-CM | POA: Diagnosis not present

## 2021-09-25 DIAGNOSIS — D631 Anemia in chronic kidney disease: Secondary | ICD-10-CM | POA: Diagnosis not present

## 2021-09-25 DIAGNOSIS — E1122 Type 2 diabetes mellitus with diabetic chronic kidney disease: Secondary | ICD-10-CM | POA: Diagnosis not present

## 2021-09-25 DIAGNOSIS — N186 End stage renal disease: Secondary | ICD-10-CM | POA: Diagnosis not present

## 2021-09-25 DIAGNOSIS — N2581 Secondary hyperparathyroidism of renal origin: Secondary | ICD-10-CM | POA: Diagnosis not present

## 2021-09-25 DIAGNOSIS — Z992 Dependence on renal dialysis: Secondary | ICD-10-CM | POA: Diagnosis not present

## 2021-09-25 DIAGNOSIS — R197 Diarrhea, unspecified: Secondary | ICD-10-CM | POA: Diagnosis not present

## 2021-09-26 DIAGNOSIS — R278 Other lack of coordination: Secondary | ICD-10-CM | POA: Diagnosis not present

## 2021-09-26 DIAGNOSIS — M6281 Muscle weakness (generalized): Secondary | ICD-10-CM | POA: Diagnosis not present

## 2021-09-26 DIAGNOSIS — R488 Other symbolic dysfunctions: Secondary | ICD-10-CM | POA: Diagnosis not present

## 2021-09-26 DIAGNOSIS — E1122 Type 2 diabetes mellitus with diabetic chronic kidney disease: Secondary | ICD-10-CM | POA: Diagnosis not present

## 2021-09-26 DIAGNOSIS — G459 Transient cerebral ischemic attack, unspecified: Secondary | ICD-10-CM | POA: Diagnosis not present

## 2021-09-26 DIAGNOSIS — J449 Chronic obstructive pulmonary disease, unspecified: Secondary | ICD-10-CM | POA: Diagnosis not present

## 2021-09-26 DIAGNOSIS — I87332 Chronic venous hypertension (idiopathic) with ulcer and inflammation of left lower extremity: Secondary | ICD-10-CM | POA: Diagnosis not present

## 2021-09-26 DIAGNOSIS — L8990 Pressure ulcer of unspecified site, unspecified stage: Secondary | ICD-10-CM | POA: Diagnosis not present

## 2021-09-26 DIAGNOSIS — S82143A Displaced bicondylar fracture of unspecified tibia, initial encounter for closed fracture: Secondary | ICD-10-CM | POA: Diagnosis not present

## 2021-09-26 DIAGNOSIS — I629 Nontraumatic intracranial hemorrhage, unspecified: Secondary | ICD-10-CM | POA: Diagnosis not present

## 2021-09-26 DIAGNOSIS — S92911A Unspecified fracture of right toe(s), initial encounter for closed fracture: Secondary | ICD-10-CM | POA: Diagnosis not present

## 2021-09-26 DIAGNOSIS — R55 Syncope and collapse: Secondary | ICD-10-CM | POA: Diagnosis not present

## 2021-09-26 DIAGNOSIS — R262 Difficulty in walking, not elsewhere classified: Secondary | ICD-10-CM | POA: Diagnosis not present

## 2021-09-26 DIAGNOSIS — S43006A Unspecified dislocation of unspecified shoulder joint, initial encounter: Secondary | ICD-10-CM | POA: Diagnosis not present

## 2021-09-27 DIAGNOSIS — R197 Diarrhea, unspecified: Secondary | ICD-10-CM | POA: Diagnosis not present

## 2021-09-27 DIAGNOSIS — N2581 Secondary hyperparathyroidism of renal origin: Secondary | ICD-10-CM | POA: Diagnosis not present

## 2021-09-27 DIAGNOSIS — E1122 Type 2 diabetes mellitus with diabetic chronic kidney disease: Secondary | ICD-10-CM | POA: Diagnosis not present

## 2021-09-27 DIAGNOSIS — N186 End stage renal disease: Secondary | ICD-10-CM | POA: Diagnosis not present

## 2021-09-27 DIAGNOSIS — D631 Anemia in chronic kidney disease: Secondary | ICD-10-CM | POA: Diagnosis not present

## 2021-09-27 DIAGNOSIS — Z992 Dependence on renal dialysis: Secondary | ICD-10-CM | POA: Diagnosis not present

## 2021-09-27 DIAGNOSIS — T7840XS Allergy, unspecified, sequela: Secondary | ICD-10-CM | POA: Diagnosis not present

## 2021-09-28 DIAGNOSIS — S43006A Unspecified dislocation of unspecified shoulder joint, initial encounter: Secondary | ICD-10-CM | POA: Diagnosis not present

## 2021-09-28 DIAGNOSIS — I87332 Chronic venous hypertension (idiopathic) with ulcer and inflammation of left lower extremity: Secondary | ICD-10-CM | POA: Diagnosis not present

## 2021-09-28 DIAGNOSIS — J449 Chronic obstructive pulmonary disease, unspecified: Secondary | ICD-10-CM | POA: Diagnosis not present

## 2021-09-28 DIAGNOSIS — R278 Other lack of coordination: Secondary | ICD-10-CM | POA: Diagnosis not present

## 2021-09-28 DIAGNOSIS — L8990 Pressure ulcer of unspecified site, unspecified stage: Secondary | ICD-10-CM | POA: Diagnosis not present

## 2021-09-28 DIAGNOSIS — E1122 Type 2 diabetes mellitus with diabetic chronic kidney disease: Secondary | ICD-10-CM | POA: Diagnosis not present

## 2021-09-28 DIAGNOSIS — G459 Transient cerebral ischemic attack, unspecified: Secondary | ICD-10-CM | POA: Diagnosis not present

## 2021-09-28 DIAGNOSIS — S82143A Displaced bicondylar fracture of unspecified tibia, initial encounter for closed fracture: Secondary | ICD-10-CM | POA: Diagnosis not present

## 2021-09-28 DIAGNOSIS — R488 Other symbolic dysfunctions: Secondary | ICD-10-CM | POA: Diagnosis not present

## 2021-09-28 DIAGNOSIS — S92911A Unspecified fracture of right toe(s), initial encounter for closed fracture: Secondary | ICD-10-CM | POA: Diagnosis not present

## 2021-09-28 DIAGNOSIS — I629 Nontraumatic intracranial hemorrhage, unspecified: Secondary | ICD-10-CM | POA: Diagnosis not present

## 2021-09-28 DIAGNOSIS — R262 Difficulty in walking, not elsewhere classified: Secondary | ICD-10-CM | POA: Diagnosis not present

## 2021-09-28 DIAGNOSIS — R55 Syncope and collapse: Secondary | ICD-10-CM | POA: Diagnosis not present

## 2021-09-28 DIAGNOSIS — M6281 Muscle weakness (generalized): Secondary | ICD-10-CM | POA: Diagnosis not present

## 2021-09-29 DIAGNOSIS — T7840XS Allergy, unspecified, sequela: Secondary | ICD-10-CM | POA: Diagnosis not present

## 2021-09-29 DIAGNOSIS — R197 Diarrhea, unspecified: Secondary | ICD-10-CM | POA: Diagnosis not present

## 2021-09-29 DIAGNOSIS — Z992 Dependence on renal dialysis: Secondary | ICD-10-CM | POA: Diagnosis not present

## 2021-09-29 DIAGNOSIS — E1122 Type 2 diabetes mellitus with diabetic chronic kidney disease: Secondary | ICD-10-CM | POA: Diagnosis not present

## 2021-09-29 DIAGNOSIS — N186 End stage renal disease: Secondary | ICD-10-CM | POA: Diagnosis not present

## 2021-09-29 DIAGNOSIS — D631 Anemia in chronic kidney disease: Secondary | ICD-10-CM | POA: Diagnosis not present

## 2021-09-29 DIAGNOSIS — N2581 Secondary hyperparathyroidism of renal origin: Secondary | ICD-10-CM | POA: Diagnosis not present

## 2021-10-02 DIAGNOSIS — N2581 Secondary hyperparathyroidism of renal origin: Secondary | ICD-10-CM | POA: Diagnosis not present

## 2021-10-02 DIAGNOSIS — E1122 Type 2 diabetes mellitus with diabetic chronic kidney disease: Secondary | ICD-10-CM | POA: Diagnosis not present

## 2021-10-02 DIAGNOSIS — N186 End stage renal disease: Secondary | ICD-10-CM | POA: Diagnosis not present

## 2021-10-02 DIAGNOSIS — Z992 Dependence on renal dialysis: Secondary | ICD-10-CM | POA: Diagnosis not present

## 2021-10-02 DIAGNOSIS — D631 Anemia in chronic kidney disease: Secondary | ICD-10-CM | POA: Diagnosis not present

## 2021-10-02 DIAGNOSIS — T7840XS Allergy, unspecified, sequela: Secondary | ICD-10-CM | POA: Diagnosis not present

## 2021-10-02 DIAGNOSIS — R197 Diarrhea, unspecified: Secondary | ICD-10-CM | POA: Diagnosis not present

## 2021-10-03 DIAGNOSIS — S82143A Displaced bicondylar fracture of unspecified tibia, initial encounter for closed fracture: Secondary | ICD-10-CM | POA: Diagnosis not present

## 2021-10-03 DIAGNOSIS — I87332 Chronic venous hypertension (idiopathic) with ulcer and inflammation of left lower extremity: Secondary | ICD-10-CM | POA: Diagnosis not present

## 2021-10-03 DIAGNOSIS — R262 Difficulty in walking, not elsewhere classified: Secondary | ICD-10-CM | POA: Diagnosis not present

## 2021-10-03 DIAGNOSIS — S92911A Unspecified fracture of right toe(s), initial encounter for closed fracture: Secondary | ICD-10-CM | POA: Diagnosis not present

## 2021-10-03 DIAGNOSIS — L8990 Pressure ulcer of unspecified site, unspecified stage: Secondary | ICD-10-CM | POA: Diagnosis not present

## 2021-10-03 DIAGNOSIS — S43006A Unspecified dislocation of unspecified shoulder joint, initial encounter: Secondary | ICD-10-CM | POA: Diagnosis not present

## 2021-10-03 DIAGNOSIS — G459 Transient cerebral ischemic attack, unspecified: Secondary | ICD-10-CM | POA: Diagnosis not present

## 2021-10-03 DIAGNOSIS — J449 Chronic obstructive pulmonary disease, unspecified: Secondary | ICD-10-CM | POA: Diagnosis not present

## 2021-10-03 DIAGNOSIS — R55 Syncope and collapse: Secondary | ICD-10-CM | POA: Diagnosis not present

## 2021-10-03 DIAGNOSIS — E1122 Type 2 diabetes mellitus with diabetic chronic kidney disease: Secondary | ICD-10-CM | POA: Diagnosis not present

## 2021-10-03 DIAGNOSIS — R488 Other symbolic dysfunctions: Secondary | ICD-10-CM | POA: Diagnosis not present

## 2021-10-03 DIAGNOSIS — I629 Nontraumatic intracranial hemorrhage, unspecified: Secondary | ICD-10-CM | POA: Diagnosis not present

## 2021-10-03 DIAGNOSIS — M6281 Muscle weakness (generalized): Secondary | ICD-10-CM | POA: Diagnosis not present

## 2021-10-03 DIAGNOSIS — R278 Other lack of coordination: Secondary | ICD-10-CM | POA: Diagnosis not present

## 2021-10-04 DIAGNOSIS — N186 End stage renal disease: Secondary | ICD-10-CM | POA: Diagnosis not present

## 2021-10-04 DIAGNOSIS — E1122 Type 2 diabetes mellitus with diabetic chronic kidney disease: Secondary | ICD-10-CM | POA: Diagnosis not present

## 2021-10-04 DIAGNOSIS — T7840XS Allergy, unspecified, sequela: Secondary | ICD-10-CM | POA: Diagnosis not present

## 2021-10-04 DIAGNOSIS — Z992 Dependence on renal dialysis: Secondary | ICD-10-CM | POA: Diagnosis not present

## 2021-10-04 DIAGNOSIS — D631 Anemia in chronic kidney disease: Secondary | ICD-10-CM | POA: Diagnosis not present

## 2021-10-04 DIAGNOSIS — R197 Diarrhea, unspecified: Secondary | ICD-10-CM | POA: Diagnosis not present

## 2021-10-04 DIAGNOSIS — N2581 Secondary hyperparathyroidism of renal origin: Secondary | ICD-10-CM | POA: Diagnosis not present

## 2021-10-05 DIAGNOSIS — S43006A Unspecified dislocation of unspecified shoulder joint, initial encounter: Secondary | ICD-10-CM | POA: Diagnosis not present

## 2021-10-05 DIAGNOSIS — E1122 Type 2 diabetes mellitus with diabetic chronic kidney disease: Secondary | ICD-10-CM | POA: Diagnosis not present

## 2021-10-05 DIAGNOSIS — R55 Syncope and collapse: Secondary | ICD-10-CM | POA: Diagnosis not present

## 2021-10-05 DIAGNOSIS — S92911A Unspecified fracture of right toe(s), initial encounter for closed fracture: Secondary | ICD-10-CM | POA: Diagnosis not present

## 2021-10-05 DIAGNOSIS — J449 Chronic obstructive pulmonary disease, unspecified: Secondary | ICD-10-CM | POA: Diagnosis not present

## 2021-10-05 DIAGNOSIS — G459 Transient cerebral ischemic attack, unspecified: Secondary | ICD-10-CM | POA: Diagnosis not present

## 2021-10-05 DIAGNOSIS — R278 Other lack of coordination: Secondary | ICD-10-CM | POA: Diagnosis not present

## 2021-10-05 DIAGNOSIS — R262 Difficulty in walking, not elsewhere classified: Secondary | ICD-10-CM | POA: Diagnosis not present

## 2021-10-05 DIAGNOSIS — L8990 Pressure ulcer of unspecified site, unspecified stage: Secondary | ICD-10-CM | POA: Diagnosis not present

## 2021-10-05 DIAGNOSIS — I629 Nontraumatic intracranial hemorrhage, unspecified: Secondary | ICD-10-CM | POA: Diagnosis not present

## 2021-10-05 DIAGNOSIS — R488 Other symbolic dysfunctions: Secondary | ICD-10-CM | POA: Diagnosis not present

## 2021-10-05 DIAGNOSIS — M6281 Muscle weakness (generalized): Secondary | ICD-10-CM | POA: Diagnosis not present

## 2021-10-05 DIAGNOSIS — I87332 Chronic venous hypertension (idiopathic) with ulcer and inflammation of left lower extremity: Secondary | ICD-10-CM | POA: Diagnosis not present

## 2021-10-05 DIAGNOSIS — S82143A Displaced bicondylar fracture of unspecified tibia, initial encounter for closed fracture: Secondary | ICD-10-CM | POA: Diagnosis not present

## 2021-10-06 DIAGNOSIS — D631 Anemia in chronic kidney disease: Secondary | ICD-10-CM | POA: Diagnosis not present

## 2021-10-06 DIAGNOSIS — E1122 Type 2 diabetes mellitus with diabetic chronic kidney disease: Secondary | ICD-10-CM | POA: Diagnosis not present

## 2021-10-06 DIAGNOSIS — N2581 Secondary hyperparathyroidism of renal origin: Secondary | ICD-10-CM | POA: Diagnosis not present

## 2021-10-06 DIAGNOSIS — R197 Diarrhea, unspecified: Secondary | ICD-10-CM | POA: Diagnosis not present

## 2021-10-06 DIAGNOSIS — Z992 Dependence on renal dialysis: Secondary | ICD-10-CM | POA: Diagnosis not present

## 2021-10-06 DIAGNOSIS — T7840XS Allergy, unspecified, sequela: Secondary | ICD-10-CM | POA: Diagnosis not present

## 2021-10-06 DIAGNOSIS — G4700 Insomnia, unspecified: Secondary | ICD-10-CM | POA: Diagnosis not present

## 2021-10-06 DIAGNOSIS — N186 End stage renal disease: Secondary | ICD-10-CM | POA: Diagnosis not present

## 2021-10-07 DIAGNOSIS — E1122 Type 2 diabetes mellitus with diabetic chronic kidney disease: Secondary | ICD-10-CM | POA: Diagnosis not present

## 2021-10-07 DIAGNOSIS — Z992 Dependence on renal dialysis: Secondary | ICD-10-CM | POA: Diagnosis not present

## 2021-10-07 DIAGNOSIS — N186 End stage renal disease: Secondary | ICD-10-CM | POA: Diagnosis not present

## 2021-10-09 DIAGNOSIS — E1122 Type 2 diabetes mellitus with diabetic chronic kidney disease: Secondary | ICD-10-CM | POA: Diagnosis not present

## 2021-10-09 DIAGNOSIS — D631 Anemia in chronic kidney disease: Secondary | ICD-10-CM | POA: Diagnosis not present

## 2021-10-09 DIAGNOSIS — N186 End stage renal disease: Secondary | ICD-10-CM | POA: Diagnosis not present

## 2021-10-09 DIAGNOSIS — Z23 Encounter for immunization: Secondary | ICD-10-CM | POA: Diagnosis not present

## 2021-10-09 DIAGNOSIS — N2581 Secondary hyperparathyroidism of renal origin: Secondary | ICD-10-CM | POA: Diagnosis not present

## 2021-10-09 DIAGNOSIS — R197 Diarrhea, unspecified: Secondary | ICD-10-CM | POA: Diagnosis not present

## 2021-10-09 DIAGNOSIS — Z992 Dependence on renal dialysis: Secondary | ICD-10-CM | POA: Diagnosis not present

## 2021-10-10 DIAGNOSIS — I87332 Chronic venous hypertension (idiopathic) with ulcer and inflammation of left lower extremity: Secondary | ICD-10-CM | POA: Diagnosis not present

## 2021-10-10 DIAGNOSIS — I629 Nontraumatic intracranial hemorrhage, unspecified: Secondary | ICD-10-CM | POA: Diagnosis not present

## 2021-10-10 DIAGNOSIS — R55 Syncope and collapse: Secondary | ICD-10-CM | POA: Diagnosis not present

## 2021-10-10 DIAGNOSIS — R488 Other symbolic dysfunctions: Secondary | ICD-10-CM | POA: Diagnosis not present

## 2021-10-10 DIAGNOSIS — L8990 Pressure ulcer of unspecified site, unspecified stage: Secondary | ICD-10-CM | POA: Diagnosis not present

## 2021-10-11 DIAGNOSIS — N186 End stage renal disease: Secondary | ICD-10-CM | POA: Diagnosis not present

## 2021-10-11 DIAGNOSIS — N2581 Secondary hyperparathyroidism of renal origin: Secondary | ICD-10-CM | POA: Diagnosis not present

## 2021-10-11 DIAGNOSIS — Z23 Encounter for immunization: Secondary | ICD-10-CM | POA: Diagnosis not present

## 2021-10-11 DIAGNOSIS — M109 Gout, unspecified: Secondary | ICD-10-CM | POA: Diagnosis not present

## 2021-10-11 DIAGNOSIS — Z992 Dependence on renal dialysis: Secondary | ICD-10-CM | POA: Diagnosis not present

## 2021-10-11 DIAGNOSIS — D631 Anemia in chronic kidney disease: Secondary | ICD-10-CM | POA: Diagnosis not present

## 2021-10-11 DIAGNOSIS — E1122 Type 2 diabetes mellitus with diabetic chronic kidney disease: Secondary | ICD-10-CM | POA: Diagnosis not present

## 2021-10-11 DIAGNOSIS — R197 Diarrhea, unspecified: Secondary | ICD-10-CM | POA: Diagnosis not present

## 2021-10-12 DIAGNOSIS — L8990 Pressure ulcer of unspecified site, unspecified stage: Secondary | ICD-10-CM | POA: Diagnosis not present

## 2021-10-12 DIAGNOSIS — R55 Syncope and collapse: Secondary | ICD-10-CM | POA: Diagnosis not present

## 2021-10-12 DIAGNOSIS — I629 Nontraumatic intracranial hemorrhage, unspecified: Secondary | ICD-10-CM | POA: Diagnosis not present

## 2021-10-12 DIAGNOSIS — I87332 Chronic venous hypertension (idiopathic) with ulcer and inflammation of left lower extremity: Secondary | ICD-10-CM | POA: Diagnosis not present

## 2021-10-12 DIAGNOSIS — R488 Other symbolic dysfunctions: Secondary | ICD-10-CM | POA: Diagnosis not present

## 2021-10-13 DIAGNOSIS — Z992 Dependence on renal dialysis: Secondary | ICD-10-CM | POA: Diagnosis not present

## 2021-10-13 DIAGNOSIS — R197 Diarrhea, unspecified: Secondary | ICD-10-CM | POA: Diagnosis not present

## 2021-10-13 DIAGNOSIS — D631 Anemia in chronic kidney disease: Secondary | ICD-10-CM | POA: Diagnosis not present

## 2021-10-13 DIAGNOSIS — N186 End stage renal disease: Secondary | ICD-10-CM | POA: Diagnosis not present

## 2021-10-13 DIAGNOSIS — E1122 Type 2 diabetes mellitus with diabetic chronic kidney disease: Secondary | ICD-10-CM | POA: Diagnosis not present

## 2021-10-13 DIAGNOSIS — N2581 Secondary hyperparathyroidism of renal origin: Secondary | ICD-10-CM | POA: Diagnosis not present

## 2021-10-13 DIAGNOSIS — Z23 Encounter for immunization: Secondary | ICD-10-CM | POA: Diagnosis not present

## 2021-10-16 DIAGNOSIS — R197 Diarrhea, unspecified: Secondary | ICD-10-CM | POA: Diagnosis not present

## 2021-10-16 DIAGNOSIS — E1122 Type 2 diabetes mellitus with diabetic chronic kidney disease: Secondary | ICD-10-CM | POA: Diagnosis not present

## 2021-10-16 DIAGNOSIS — Z23 Encounter for immunization: Secondary | ICD-10-CM | POA: Diagnosis not present

## 2021-10-16 DIAGNOSIS — Z992 Dependence on renal dialysis: Secondary | ICD-10-CM | POA: Diagnosis not present

## 2021-10-16 DIAGNOSIS — D631 Anemia in chronic kidney disease: Secondary | ICD-10-CM | POA: Diagnosis not present

## 2021-10-16 DIAGNOSIS — N186 End stage renal disease: Secondary | ICD-10-CM | POA: Diagnosis not present

## 2021-10-16 DIAGNOSIS — N2581 Secondary hyperparathyroidism of renal origin: Secondary | ICD-10-CM | POA: Diagnosis not present

## 2021-10-17 DIAGNOSIS — L8990 Pressure ulcer of unspecified site, unspecified stage: Secondary | ICD-10-CM | POA: Diagnosis not present

## 2021-10-17 DIAGNOSIS — I87332 Chronic venous hypertension (idiopathic) with ulcer and inflammation of left lower extremity: Secondary | ICD-10-CM | POA: Diagnosis not present

## 2021-10-17 DIAGNOSIS — R55 Syncope and collapse: Secondary | ICD-10-CM | POA: Diagnosis not present

## 2021-10-17 DIAGNOSIS — I629 Nontraumatic intracranial hemorrhage, unspecified: Secondary | ICD-10-CM | POA: Diagnosis not present

## 2021-10-17 DIAGNOSIS — R488 Other symbolic dysfunctions: Secondary | ICD-10-CM | POA: Diagnosis not present

## 2021-10-18 DIAGNOSIS — Z23 Encounter for immunization: Secondary | ICD-10-CM | POA: Diagnosis not present

## 2021-10-18 DIAGNOSIS — Z992 Dependence on renal dialysis: Secondary | ICD-10-CM | POA: Diagnosis not present

## 2021-10-18 DIAGNOSIS — D631 Anemia in chronic kidney disease: Secondary | ICD-10-CM | POA: Diagnosis not present

## 2021-10-18 DIAGNOSIS — N186 End stage renal disease: Secondary | ICD-10-CM | POA: Diagnosis not present

## 2021-10-18 DIAGNOSIS — R197 Diarrhea, unspecified: Secondary | ICD-10-CM | POA: Diagnosis not present

## 2021-10-18 DIAGNOSIS — E1122 Type 2 diabetes mellitus with diabetic chronic kidney disease: Secondary | ICD-10-CM | POA: Diagnosis not present

## 2021-10-18 DIAGNOSIS — N2581 Secondary hyperparathyroidism of renal origin: Secondary | ICD-10-CM | POA: Diagnosis not present

## 2021-10-19 ENCOUNTER — Encounter (INDEPENDENT_AMBULATORY_CARE_PROVIDER_SITE_OTHER): Payer: Medicare Other | Admitting: Ophthalmology

## 2021-10-19 DIAGNOSIS — H35033 Hypertensive retinopathy, bilateral: Secondary | ICD-10-CM

## 2021-10-19 DIAGNOSIS — L8990 Pressure ulcer of unspecified site, unspecified stage: Secondary | ICD-10-CM | POA: Diagnosis not present

## 2021-10-19 DIAGNOSIS — H43813 Vitreous degeneration, bilateral: Secondary | ICD-10-CM

## 2021-10-19 DIAGNOSIS — I629 Nontraumatic intracranial hemorrhage, unspecified: Secondary | ICD-10-CM | POA: Diagnosis not present

## 2021-10-19 DIAGNOSIS — E113512 Type 2 diabetes mellitus with proliferative diabetic retinopathy with macular edema, left eye: Secondary | ICD-10-CM

## 2021-10-19 DIAGNOSIS — R488 Other symbolic dysfunctions: Secondary | ICD-10-CM | POA: Diagnosis not present

## 2021-10-19 DIAGNOSIS — E113591 Type 2 diabetes mellitus with proliferative diabetic retinopathy without macular edema, right eye: Secondary | ICD-10-CM

## 2021-10-19 DIAGNOSIS — I10 Essential (primary) hypertension: Secondary | ICD-10-CM | POA: Diagnosis not present

## 2021-10-19 DIAGNOSIS — R55 Syncope and collapse: Secondary | ICD-10-CM | POA: Diagnosis not present

## 2021-10-19 DIAGNOSIS — I87332 Chronic venous hypertension (idiopathic) with ulcer and inflammation of left lower extremity: Secondary | ICD-10-CM | POA: Diagnosis not present

## 2021-10-20 DIAGNOSIS — Z992 Dependence on renal dialysis: Secondary | ICD-10-CM | POA: Diagnosis not present

## 2021-10-20 DIAGNOSIS — N186 End stage renal disease: Secondary | ICD-10-CM | POA: Diagnosis not present

## 2021-10-20 DIAGNOSIS — R197 Diarrhea, unspecified: Secondary | ICD-10-CM | POA: Diagnosis not present

## 2021-10-20 DIAGNOSIS — E1122 Type 2 diabetes mellitus with diabetic chronic kidney disease: Secondary | ICD-10-CM | POA: Diagnosis not present

## 2021-10-20 DIAGNOSIS — Z23 Encounter for immunization: Secondary | ICD-10-CM | POA: Diagnosis not present

## 2021-10-20 DIAGNOSIS — D631 Anemia in chronic kidney disease: Secondary | ICD-10-CM | POA: Diagnosis not present

## 2021-10-20 DIAGNOSIS — N2581 Secondary hyperparathyroidism of renal origin: Secondary | ICD-10-CM | POA: Diagnosis not present

## 2021-10-23 DIAGNOSIS — Z992 Dependence on renal dialysis: Secondary | ICD-10-CM | POA: Diagnosis not present

## 2021-10-23 DIAGNOSIS — N186 End stage renal disease: Secondary | ICD-10-CM | POA: Diagnosis not present

## 2021-10-23 DIAGNOSIS — I10 Essential (primary) hypertension: Secondary | ICD-10-CM | POA: Diagnosis not present

## 2021-10-23 DIAGNOSIS — N2581 Secondary hyperparathyroidism of renal origin: Secondary | ICD-10-CM | POA: Diagnosis not present

## 2021-10-23 DIAGNOSIS — E1122 Type 2 diabetes mellitus with diabetic chronic kidney disease: Secondary | ICD-10-CM | POA: Diagnosis not present

## 2021-10-23 DIAGNOSIS — D631 Anemia in chronic kidney disease: Secondary | ICD-10-CM | POA: Diagnosis not present

## 2021-10-23 DIAGNOSIS — R197 Diarrhea, unspecified: Secondary | ICD-10-CM | POA: Diagnosis not present

## 2021-10-23 DIAGNOSIS — Z23 Encounter for immunization: Secondary | ICD-10-CM | POA: Diagnosis not present

## 2021-10-24 DIAGNOSIS — R488 Other symbolic dysfunctions: Secondary | ICD-10-CM | POA: Diagnosis not present

## 2021-10-24 DIAGNOSIS — I629 Nontraumatic intracranial hemorrhage, unspecified: Secondary | ICD-10-CM | POA: Diagnosis not present

## 2021-10-24 DIAGNOSIS — R55 Syncope and collapse: Secondary | ICD-10-CM | POA: Diagnosis not present

## 2021-10-24 DIAGNOSIS — L8990 Pressure ulcer of unspecified site, unspecified stage: Secondary | ICD-10-CM | POA: Diagnosis not present

## 2021-10-24 DIAGNOSIS — I87332 Chronic venous hypertension (idiopathic) with ulcer and inflammation of left lower extremity: Secondary | ICD-10-CM | POA: Diagnosis not present

## 2021-10-25 DIAGNOSIS — N2581 Secondary hyperparathyroidism of renal origin: Secondary | ICD-10-CM | POA: Diagnosis not present

## 2021-10-25 DIAGNOSIS — R197 Diarrhea, unspecified: Secondary | ICD-10-CM | POA: Diagnosis not present

## 2021-10-25 DIAGNOSIS — Z992 Dependence on renal dialysis: Secondary | ICD-10-CM | POA: Diagnosis not present

## 2021-10-25 DIAGNOSIS — D631 Anemia in chronic kidney disease: Secondary | ICD-10-CM | POA: Diagnosis not present

## 2021-10-25 DIAGNOSIS — N186 End stage renal disease: Secondary | ICD-10-CM | POA: Diagnosis not present

## 2021-10-25 DIAGNOSIS — Z23 Encounter for immunization: Secondary | ICD-10-CM | POA: Diagnosis not present

## 2021-10-25 DIAGNOSIS — E1122 Type 2 diabetes mellitus with diabetic chronic kidney disease: Secondary | ICD-10-CM | POA: Diagnosis not present

## 2021-10-27 DIAGNOSIS — Z23 Encounter for immunization: Secondary | ICD-10-CM | POA: Diagnosis not present

## 2021-10-27 DIAGNOSIS — N186 End stage renal disease: Secondary | ICD-10-CM | POA: Diagnosis not present

## 2021-10-27 DIAGNOSIS — D631 Anemia in chronic kidney disease: Secondary | ICD-10-CM | POA: Diagnosis not present

## 2021-10-27 DIAGNOSIS — R197 Diarrhea, unspecified: Secondary | ICD-10-CM | POA: Diagnosis not present

## 2021-10-27 DIAGNOSIS — N2581 Secondary hyperparathyroidism of renal origin: Secondary | ICD-10-CM | POA: Diagnosis not present

## 2021-10-27 DIAGNOSIS — E1122 Type 2 diabetes mellitus with diabetic chronic kidney disease: Secondary | ICD-10-CM | POA: Diagnosis not present

## 2021-10-27 DIAGNOSIS — Z992 Dependence on renal dialysis: Secondary | ICD-10-CM | POA: Diagnosis not present

## 2021-10-30 DIAGNOSIS — D631 Anemia in chronic kidney disease: Secondary | ICD-10-CM | POA: Diagnosis not present

## 2021-10-30 DIAGNOSIS — Z23 Encounter for immunization: Secondary | ICD-10-CM | POA: Diagnosis not present

## 2021-10-30 DIAGNOSIS — N2581 Secondary hyperparathyroidism of renal origin: Secondary | ICD-10-CM | POA: Diagnosis not present

## 2021-10-30 DIAGNOSIS — N186 End stage renal disease: Secondary | ICD-10-CM | POA: Diagnosis not present

## 2021-10-30 DIAGNOSIS — R197 Diarrhea, unspecified: Secondary | ICD-10-CM | POA: Diagnosis not present

## 2021-10-30 DIAGNOSIS — E1122 Type 2 diabetes mellitus with diabetic chronic kidney disease: Secondary | ICD-10-CM | POA: Diagnosis not present

## 2021-10-30 DIAGNOSIS — Z992 Dependence on renal dialysis: Secondary | ICD-10-CM | POA: Diagnosis not present

## 2021-11-01 DIAGNOSIS — N186 End stage renal disease: Secondary | ICD-10-CM | POA: Diagnosis not present

## 2021-11-01 DIAGNOSIS — N2581 Secondary hyperparathyroidism of renal origin: Secondary | ICD-10-CM | POA: Diagnosis not present

## 2021-11-01 DIAGNOSIS — Z992 Dependence on renal dialysis: Secondary | ICD-10-CM | POA: Diagnosis not present

## 2021-11-01 DIAGNOSIS — D631 Anemia in chronic kidney disease: Secondary | ICD-10-CM | POA: Diagnosis not present

## 2021-11-01 DIAGNOSIS — E1122 Type 2 diabetes mellitus with diabetic chronic kidney disease: Secondary | ICD-10-CM | POA: Diagnosis not present

## 2021-11-01 DIAGNOSIS — Z23 Encounter for immunization: Secondary | ICD-10-CM | POA: Diagnosis not present

## 2021-11-01 DIAGNOSIS — R197 Diarrhea, unspecified: Secondary | ICD-10-CM | POA: Diagnosis not present

## 2021-11-03 DIAGNOSIS — N186 End stage renal disease: Secondary | ICD-10-CM | POA: Diagnosis not present

## 2021-11-03 DIAGNOSIS — E1122 Type 2 diabetes mellitus with diabetic chronic kidney disease: Secondary | ICD-10-CM | POA: Diagnosis not present

## 2021-11-03 DIAGNOSIS — D631 Anemia in chronic kidney disease: Secondary | ICD-10-CM | POA: Diagnosis not present

## 2021-11-03 DIAGNOSIS — Z23 Encounter for immunization: Secondary | ICD-10-CM | POA: Diagnosis not present

## 2021-11-03 DIAGNOSIS — Z992 Dependence on renal dialysis: Secondary | ICD-10-CM | POA: Diagnosis not present

## 2021-11-03 DIAGNOSIS — N2581 Secondary hyperparathyroidism of renal origin: Secondary | ICD-10-CM | POA: Diagnosis not present

## 2021-11-03 DIAGNOSIS — R197 Diarrhea, unspecified: Secondary | ICD-10-CM | POA: Diagnosis not present

## 2021-11-06 DIAGNOSIS — E1122 Type 2 diabetes mellitus with diabetic chronic kidney disease: Secondary | ICD-10-CM | POA: Diagnosis not present

## 2021-11-06 DIAGNOSIS — Z23 Encounter for immunization: Secondary | ICD-10-CM | POA: Diagnosis not present

## 2021-11-06 DIAGNOSIS — Z992 Dependence on renal dialysis: Secondary | ICD-10-CM | POA: Diagnosis not present

## 2021-11-06 DIAGNOSIS — N2581 Secondary hyperparathyroidism of renal origin: Secondary | ICD-10-CM | POA: Diagnosis not present

## 2021-11-06 DIAGNOSIS — D631 Anemia in chronic kidney disease: Secondary | ICD-10-CM | POA: Diagnosis not present

## 2021-11-06 DIAGNOSIS — N186 End stage renal disease: Secondary | ICD-10-CM | POA: Diagnosis not present

## 2021-11-06 DIAGNOSIS — R197 Diarrhea, unspecified: Secondary | ICD-10-CM | POA: Diagnosis not present

## 2021-11-07 DIAGNOSIS — N186 End stage renal disease: Secondary | ICD-10-CM | POA: Diagnosis not present

## 2021-11-07 DIAGNOSIS — Z992 Dependence on renal dialysis: Secondary | ICD-10-CM | POA: Diagnosis not present

## 2021-11-07 DIAGNOSIS — E1122 Type 2 diabetes mellitus with diabetic chronic kidney disease: Secondary | ICD-10-CM | POA: Diagnosis not present

## 2021-11-08 DIAGNOSIS — E1122 Type 2 diabetes mellitus with diabetic chronic kidney disease: Secondary | ICD-10-CM | POA: Diagnosis not present

## 2021-11-08 DIAGNOSIS — N186 End stage renal disease: Secondary | ICD-10-CM | POA: Diagnosis not present

## 2021-11-08 DIAGNOSIS — R197 Diarrhea, unspecified: Secondary | ICD-10-CM | POA: Diagnosis not present

## 2021-11-08 DIAGNOSIS — Z992 Dependence on renal dialysis: Secondary | ICD-10-CM | POA: Diagnosis not present

## 2021-11-08 DIAGNOSIS — N2581 Secondary hyperparathyroidism of renal origin: Secondary | ICD-10-CM | POA: Diagnosis not present

## 2021-11-08 DIAGNOSIS — D631 Anemia in chronic kidney disease: Secondary | ICD-10-CM | POA: Diagnosis not present

## 2021-11-10 DIAGNOSIS — R197 Diarrhea, unspecified: Secondary | ICD-10-CM | POA: Diagnosis not present

## 2021-11-10 DIAGNOSIS — S6990XA Unspecified injury of unspecified wrist, hand and finger(s), initial encounter: Secondary | ICD-10-CM | POA: Diagnosis not present

## 2021-11-10 DIAGNOSIS — N2581 Secondary hyperparathyroidism of renal origin: Secondary | ICD-10-CM | POA: Diagnosis not present

## 2021-11-10 DIAGNOSIS — N186 End stage renal disease: Secondary | ICD-10-CM | POA: Diagnosis not present

## 2021-11-10 DIAGNOSIS — E1122 Type 2 diabetes mellitus with diabetic chronic kidney disease: Secondary | ICD-10-CM | POA: Diagnosis not present

## 2021-11-10 DIAGNOSIS — Z992 Dependence on renal dialysis: Secondary | ICD-10-CM | POA: Diagnosis not present

## 2021-11-10 DIAGNOSIS — M109 Gout, unspecified: Secondary | ICD-10-CM | POA: Diagnosis not present

## 2021-11-10 DIAGNOSIS — D631 Anemia in chronic kidney disease: Secondary | ICD-10-CM | POA: Diagnosis not present

## 2021-11-13 DIAGNOSIS — N2581 Secondary hyperparathyroidism of renal origin: Secondary | ICD-10-CM | POA: Diagnosis not present

## 2021-11-13 DIAGNOSIS — E1122 Type 2 diabetes mellitus with diabetic chronic kidney disease: Secondary | ICD-10-CM | POA: Diagnosis not present

## 2021-11-13 DIAGNOSIS — R197 Diarrhea, unspecified: Secondary | ICD-10-CM | POA: Diagnosis not present

## 2021-11-13 DIAGNOSIS — D631 Anemia in chronic kidney disease: Secondary | ICD-10-CM | POA: Diagnosis not present

## 2021-11-13 DIAGNOSIS — N186 End stage renal disease: Secondary | ICD-10-CM | POA: Diagnosis not present

## 2021-11-13 DIAGNOSIS — Z992 Dependence on renal dialysis: Secondary | ICD-10-CM | POA: Diagnosis not present

## 2021-11-15 DIAGNOSIS — R197 Diarrhea, unspecified: Secondary | ICD-10-CM | POA: Diagnosis not present

## 2021-11-15 DIAGNOSIS — N2581 Secondary hyperparathyroidism of renal origin: Secondary | ICD-10-CM | POA: Diagnosis not present

## 2021-11-15 DIAGNOSIS — E1122 Type 2 diabetes mellitus with diabetic chronic kidney disease: Secondary | ICD-10-CM | POA: Diagnosis not present

## 2021-11-15 DIAGNOSIS — D631 Anemia in chronic kidney disease: Secondary | ICD-10-CM | POA: Diagnosis not present

## 2021-11-15 DIAGNOSIS — Z992 Dependence on renal dialysis: Secondary | ICD-10-CM | POA: Diagnosis not present

## 2021-11-15 DIAGNOSIS — N186 End stage renal disease: Secondary | ICD-10-CM | POA: Diagnosis not present

## 2021-11-16 ENCOUNTER — Encounter (INDEPENDENT_AMBULATORY_CARE_PROVIDER_SITE_OTHER): Payer: Medicare Other | Admitting: Ophthalmology

## 2021-11-16 DIAGNOSIS — H43813 Vitreous degeneration, bilateral: Secondary | ICD-10-CM

## 2021-11-16 DIAGNOSIS — I10 Essential (primary) hypertension: Secondary | ICD-10-CM

## 2021-11-16 DIAGNOSIS — H35033 Hypertensive retinopathy, bilateral: Secondary | ICD-10-CM | POA: Diagnosis not present

## 2021-11-16 DIAGNOSIS — E113513 Type 2 diabetes mellitus with proliferative diabetic retinopathy with macular edema, bilateral: Secondary | ICD-10-CM | POA: Diagnosis not present

## 2021-11-17 DIAGNOSIS — N186 End stage renal disease: Secondary | ICD-10-CM | POA: Diagnosis not present

## 2021-11-17 DIAGNOSIS — D631 Anemia in chronic kidney disease: Secondary | ICD-10-CM | POA: Diagnosis not present

## 2021-11-17 DIAGNOSIS — E1122 Type 2 diabetes mellitus with diabetic chronic kidney disease: Secondary | ICD-10-CM | POA: Diagnosis not present

## 2021-11-17 DIAGNOSIS — N2581 Secondary hyperparathyroidism of renal origin: Secondary | ICD-10-CM | POA: Diagnosis not present

## 2021-11-17 DIAGNOSIS — R197 Diarrhea, unspecified: Secondary | ICD-10-CM | POA: Diagnosis not present

## 2021-11-17 DIAGNOSIS — Z992 Dependence on renal dialysis: Secondary | ICD-10-CM | POA: Diagnosis not present

## 2021-11-20 DIAGNOSIS — D631 Anemia in chronic kidney disease: Secondary | ICD-10-CM | POA: Diagnosis not present

## 2021-11-20 DIAGNOSIS — R197 Diarrhea, unspecified: Secondary | ICD-10-CM | POA: Diagnosis not present

## 2021-11-20 DIAGNOSIS — Z992 Dependence on renal dialysis: Secondary | ICD-10-CM | POA: Diagnosis not present

## 2021-11-20 DIAGNOSIS — N2581 Secondary hyperparathyroidism of renal origin: Secondary | ICD-10-CM | POA: Diagnosis not present

## 2021-11-20 DIAGNOSIS — N186 End stage renal disease: Secondary | ICD-10-CM | POA: Diagnosis not present

## 2021-11-20 DIAGNOSIS — E1122 Type 2 diabetes mellitus with diabetic chronic kidney disease: Secondary | ICD-10-CM | POA: Diagnosis not present

## 2021-11-22 DIAGNOSIS — Z992 Dependence on renal dialysis: Secondary | ICD-10-CM | POA: Diagnosis not present

## 2021-11-22 DIAGNOSIS — N2581 Secondary hyperparathyroidism of renal origin: Secondary | ICD-10-CM | POA: Diagnosis not present

## 2021-11-22 DIAGNOSIS — N186 End stage renal disease: Secondary | ICD-10-CM | POA: Diagnosis not present

## 2021-11-22 DIAGNOSIS — R197 Diarrhea, unspecified: Secondary | ICD-10-CM | POA: Diagnosis not present

## 2021-11-22 DIAGNOSIS — D631 Anemia in chronic kidney disease: Secondary | ICD-10-CM | POA: Diagnosis not present

## 2021-11-22 DIAGNOSIS — E1122 Type 2 diabetes mellitus with diabetic chronic kidney disease: Secondary | ICD-10-CM | POA: Diagnosis not present

## 2021-11-24 DIAGNOSIS — N186 End stage renal disease: Secondary | ICD-10-CM | POA: Diagnosis not present

## 2021-11-24 DIAGNOSIS — Z992 Dependence on renal dialysis: Secondary | ICD-10-CM | POA: Diagnosis not present

## 2021-11-24 DIAGNOSIS — R197 Diarrhea, unspecified: Secondary | ICD-10-CM | POA: Diagnosis not present

## 2021-11-24 DIAGNOSIS — N2581 Secondary hyperparathyroidism of renal origin: Secondary | ICD-10-CM | POA: Diagnosis not present

## 2021-11-24 DIAGNOSIS — E1122 Type 2 diabetes mellitus with diabetic chronic kidney disease: Secondary | ICD-10-CM | POA: Diagnosis not present

## 2021-11-24 DIAGNOSIS — D631 Anemia in chronic kidney disease: Secondary | ICD-10-CM | POA: Diagnosis not present

## 2021-11-26 DIAGNOSIS — N186 End stage renal disease: Secondary | ICD-10-CM | POA: Diagnosis not present

## 2021-11-26 DIAGNOSIS — Z992 Dependence on renal dialysis: Secondary | ICD-10-CM | POA: Diagnosis not present

## 2021-11-26 DIAGNOSIS — N2581 Secondary hyperparathyroidism of renal origin: Secondary | ICD-10-CM | POA: Diagnosis not present

## 2021-11-26 DIAGNOSIS — R197 Diarrhea, unspecified: Secondary | ICD-10-CM | POA: Diagnosis not present

## 2021-11-26 DIAGNOSIS — D631 Anemia in chronic kidney disease: Secondary | ICD-10-CM | POA: Diagnosis not present

## 2021-11-26 DIAGNOSIS — E1122 Type 2 diabetes mellitus with diabetic chronic kidney disease: Secondary | ICD-10-CM | POA: Diagnosis not present

## 2021-11-27 DIAGNOSIS — G4700 Insomnia, unspecified: Secondary | ICD-10-CM | POA: Diagnosis not present

## 2021-11-28 DIAGNOSIS — Z992 Dependence on renal dialysis: Secondary | ICD-10-CM | POA: Diagnosis not present

## 2021-11-28 DIAGNOSIS — N186 End stage renal disease: Secondary | ICD-10-CM | POA: Diagnosis not present

## 2021-11-28 DIAGNOSIS — D631 Anemia in chronic kidney disease: Secondary | ICD-10-CM | POA: Diagnosis not present

## 2021-11-28 DIAGNOSIS — R197 Diarrhea, unspecified: Secondary | ICD-10-CM | POA: Diagnosis not present

## 2021-11-28 DIAGNOSIS — N2581 Secondary hyperparathyroidism of renal origin: Secondary | ICD-10-CM | POA: Diagnosis not present

## 2021-11-28 DIAGNOSIS — E1122 Type 2 diabetes mellitus with diabetic chronic kidney disease: Secondary | ICD-10-CM | POA: Diagnosis not present

## 2021-11-29 DIAGNOSIS — G4700 Insomnia, unspecified: Secondary | ICD-10-CM | POA: Diagnosis not present

## 2021-12-01 DIAGNOSIS — M6281 Muscle weakness (generalized): Secondary | ICD-10-CM | POA: Diagnosis not present

## 2021-12-01 DIAGNOSIS — E1122 Type 2 diabetes mellitus with diabetic chronic kidney disease: Secondary | ICD-10-CM | POA: Diagnosis not present

## 2021-12-01 DIAGNOSIS — N2581 Secondary hyperparathyroidism of renal origin: Secondary | ICD-10-CM | POA: Diagnosis not present

## 2021-12-01 DIAGNOSIS — D631 Anemia in chronic kidney disease: Secondary | ICD-10-CM | POA: Diagnosis not present

## 2021-12-01 DIAGNOSIS — I629 Nontraumatic intracranial hemorrhage, unspecified: Secondary | ICD-10-CM | POA: Diagnosis not present

## 2021-12-01 DIAGNOSIS — R2689 Other abnormalities of gait and mobility: Secondary | ICD-10-CM | POA: Diagnosis not present

## 2021-12-01 DIAGNOSIS — R197 Diarrhea, unspecified: Secondary | ICD-10-CM | POA: Diagnosis not present

## 2021-12-01 DIAGNOSIS — N186 End stage renal disease: Secondary | ICD-10-CM | POA: Diagnosis not present

## 2021-12-01 DIAGNOSIS — Z992 Dependence on renal dialysis: Secondary | ICD-10-CM | POA: Diagnosis not present

## 2021-12-04 DIAGNOSIS — E1122 Type 2 diabetes mellitus with diabetic chronic kidney disease: Secondary | ICD-10-CM | POA: Diagnosis not present

## 2021-12-04 DIAGNOSIS — M6281 Muscle weakness (generalized): Secondary | ICD-10-CM | POA: Diagnosis not present

## 2021-12-04 DIAGNOSIS — N186 End stage renal disease: Secondary | ICD-10-CM | POA: Diagnosis not present

## 2021-12-04 DIAGNOSIS — N2581 Secondary hyperparathyroidism of renal origin: Secondary | ICD-10-CM | POA: Diagnosis not present

## 2021-12-04 DIAGNOSIS — Z992 Dependence on renal dialysis: Secondary | ICD-10-CM | POA: Diagnosis not present

## 2021-12-04 DIAGNOSIS — R197 Diarrhea, unspecified: Secondary | ICD-10-CM | POA: Diagnosis not present

## 2021-12-04 DIAGNOSIS — D631 Anemia in chronic kidney disease: Secondary | ICD-10-CM | POA: Diagnosis not present

## 2021-12-04 DIAGNOSIS — R2689 Other abnormalities of gait and mobility: Secondary | ICD-10-CM | POA: Diagnosis not present

## 2021-12-04 DIAGNOSIS — I629 Nontraumatic intracranial hemorrhage, unspecified: Secondary | ICD-10-CM | POA: Diagnosis not present

## 2021-12-05 DIAGNOSIS — R2689 Other abnormalities of gait and mobility: Secondary | ICD-10-CM | POA: Diagnosis not present

## 2021-12-05 DIAGNOSIS — M6281 Muscle weakness (generalized): Secondary | ICD-10-CM | POA: Diagnosis not present

## 2021-12-05 DIAGNOSIS — I629 Nontraumatic intracranial hemorrhage, unspecified: Secondary | ICD-10-CM | POA: Diagnosis not present

## 2021-12-06 DIAGNOSIS — I10 Essential (primary) hypertension: Secondary | ICD-10-CM | POA: Diagnosis not present

## 2021-12-06 DIAGNOSIS — R197 Diarrhea, unspecified: Secondary | ICD-10-CM | POA: Diagnosis not present

## 2021-12-06 DIAGNOSIS — G4700 Insomnia, unspecified: Secondary | ICD-10-CM | POA: Diagnosis not present

## 2021-12-06 DIAGNOSIS — G459 Transient cerebral ischemic attack, unspecified: Secondary | ICD-10-CM | POA: Diagnosis not present

## 2021-12-06 DIAGNOSIS — G629 Polyneuropathy, unspecified: Secondary | ICD-10-CM | POA: Diagnosis not present

## 2021-12-06 DIAGNOSIS — R2689 Other abnormalities of gait and mobility: Secondary | ICD-10-CM | POA: Diagnosis not present

## 2021-12-06 DIAGNOSIS — E785 Hyperlipidemia, unspecified: Secondary | ICD-10-CM | POA: Diagnosis not present

## 2021-12-06 DIAGNOSIS — I629 Nontraumatic intracranial hemorrhage, unspecified: Secondary | ICD-10-CM | POA: Diagnosis not present

## 2021-12-06 DIAGNOSIS — N186 End stage renal disease: Secondary | ICD-10-CM | POA: Diagnosis not present

## 2021-12-06 DIAGNOSIS — Z992 Dependence on renal dialysis: Secondary | ICD-10-CM | POA: Diagnosis not present

## 2021-12-06 DIAGNOSIS — M6281 Muscle weakness (generalized): Secondary | ICD-10-CM | POA: Diagnosis not present

## 2021-12-06 DIAGNOSIS — E1122 Type 2 diabetes mellitus with diabetic chronic kidney disease: Secondary | ICD-10-CM | POA: Diagnosis not present

## 2021-12-06 DIAGNOSIS — N2581 Secondary hyperparathyroidism of renal origin: Secondary | ICD-10-CM | POA: Diagnosis not present

## 2021-12-06 DIAGNOSIS — D631 Anemia in chronic kidney disease: Secondary | ICD-10-CM | POA: Diagnosis not present

## 2021-12-06 DIAGNOSIS — E559 Vitamin D deficiency, unspecified: Secondary | ICD-10-CM | POA: Diagnosis not present

## 2021-12-07 DIAGNOSIS — I629 Nontraumatic intracranial hemorrhage, unspecified: Secondary | ICD-10-CM | POA: Diagnosis not present

## 2021-12-07 DIAGNOSIS — R2689 Other abnormalities of gait and mobility: Secondary | ICD-10-CM | POA: Diagnosis not present

## 2021-12-07 DIAGNOSIS — E119 Type 2 diabetes mellitus without complications: Secondary | ICD-10-CM | POA: Diagnosis not present

## 2021-12-07 DIAGNOSIS — E1122 Type 2 diabetes mellitus with diabetic chronic kidney disease: Secondary | ICD-10-CM | POA: Diagnosis not present

## 2021-12-07 DIAGNOSIS — M6281 Muscle weakness (generalized): Secondary | ICD-10-CM | POA: Diagnosis not present

## 2021-12-07 DIAGNOSIS — N186 End stage renal disease: Secondary | ICD-10-CM | POA: Diagnosis not present

## 2021-12-07 DIAGNOSIS — Z992 Dependence on renal dialysis: Secondary | ICD-10-CM | POA: Diagnosis not present

## 2021-12-07 DIAGNOSIS — I10 Essential (primary) hypertension: Secondary | ICD-10-CM | POA: Diagnosis not present

## 2021-12-08 DIAGNOSIS — L299 Pruritus, unspecified: Secondary | ICD-10-CM | POA: Diagnosis not present

## 2021-12-08 DIAGNOSIS — R197 Diarrhea, unspecified: Secondary | ICD-10-CM | POA: Diagnosis not present

## 2021-12-08 DIAGNOSIS — D631 Anemia in chronic kidney disease: Secondary | ICD-10-CM | POA: Diagnosis not present

## 2021-12-08 DIAGNOSIS — Z992 Dependence on renal dialysis: Secondary | ICD-10-CM | POA: Diagnosis not present

## 2021-12-08 DIAGNOSIS — N186 End stage renal disease: Secondary | ICD-10-CM | POA: Diagnosis not present

## 2021-12-08 DIAGNOSIS — N2581 Secondary hyperparathyroidism of renal origin: Secondary | ICD-10-CM | POA: Diagnosis not present

## 2021-12-08 DIAGNOSIS — E1122 Type 2 diabetes mellitus with diabetic chronic kidney disease: Secondary | ICD-10-CM | POA: Diagnosis not present

## 2021-12-11 DIAGNOSIS — N186 End stage renal disease: Secondary | ICD-10-CM | POA: Diagnosis not present

## 2021-12-11 DIAGNOSIS — L299 Pruritus, unspecified: Secondary | ICD-10-CM | POA: Diagnosis not present

## 2021-12-11 DIAGNOSIS — D631 Anemia in chronic kidney disease: Secondary | ICD-10-CM | POA: Diagnosis not present

## 2021-12-11 DIAGNOSIS — E1122 Type 2 diabetes mellitus with diabetic chronic kidney disease: Secondary | ICD-10-CM | POA: Diagnosis not present

## 2021-12-11 DIAGNOSIS — R197 Diarrhea, unspecified: Secondary | ICD-10-CM | POA: Diagnosis not present

## 2021-12-11 DIAGNOSIS — M6281 Muscle weakness (generalized): Secondary | ICD-10-CM | POA: Diagnosis not present

## 2021-12-11 DIAGNOSIS — N2581 Secondary hyperparathyroidism of renal origin: Secondary | ICD-10-CM | POA: Diagnosis not present

## 2021-12-11 DIAGNOSIS — Z992 Dependence on renal dialysis: Secondary | ICD-10-CM | POA: Diagnosis not present

## 2021-12-11 DIAGNOSIS — R2689 Other abnormalities of gait and mobility: Secondary | ICD-10-CM | POA: Diagnosis not present

## 2021-12-11 DIAGNOSIS — I629 Nontraumatic intracranial hemorrhage, unspecified: Secondary | ICD-10-CM | POA: Diagnosis not present

## 2021-12-13 DIAGNOSIS — N186 End stage renal disease: Secondary | ICD-10-CM | POA: Diagnosis not present

## 2021-12-13 DIAGNOSIS — Z992 Dependence on renal dialysis: Secondary | ICD-10-CM | POA: Diagnosis not present

## 2021-12-13 DIAGNOSIS — R2689 Other abnormalities of gait and mobility: Secondary | ICD-10-CM | POA: Diagnosis not present

## 2021-12-13 DIAGNOSIS — D631 Anemia in chronic kidney disease: Secondary | ICD-10-CM | POA: Diagnosis not present

## 2021-12-13 DIAGNOSIS — M6281 Muscle weakness (generalized): Secondary | ICD-10-CM | POA: Diagnosis not present

## 2021-12-13 DIAGNOSIS — L299 Pruritus, unspecified: Secondary | ICD-10-CM | POA: Diagnosis not present

## 2021-12-13 DIAGNOSIS — N2581 Secondary hyperparathyroidism of renal origin: Secondary | ICD-10-CM | POA: Diagnosis not present

## 2021-12-13 DIAGNOSIS — E1122 Type 2 diabetes mellitus with diabetic chronic kidney disease: Secondary | ICD-10-CM | POA: Diagnosis not present

## 2021-12-13 DIAGNOSIS — R197 Diarrhea, unspecified: Secondary | ICD-10-CM | POA: Diagnosis not present

## 2021-12-13 DIAGNOSIS — I629 Nontraumatic intracranial hemorrhage, unspecified: Secondary | ICD-10-CM | POA: Diagnosis not present

## 2021-12-14 ENCOUNTER — Emergency Department (HOSPITAL_COMMUNITY)
Admission: EM | Admit: 2021-12-14 | Discharge: 2021-12-14 | Disposition: A | Discharge status: Home / Self Care | Payer: Medicare Other | Attending: Emergency Medicine | Admitting: Emergency Medicine

## 2021-12-14 ENCOUNTER — Encounter (INDEPENDENT_AMBULATORY_CARE_PROVIDER_SITE_OTHER): Payer: Medicare Other | Admitting: Ophthalmology

## 2021-12-14 ENCOUNTER — Encounter (HOSPITAL_COMMUNITY): Payer: Self-pay

## 2021-12-14 ENCOUNTER — Other Ambulatory Visit: Payer: Self-pay

## 2021-12-14 DIAGNOSIS — R112 Nausea with vomiting, unspecified: Secondary | ICD-10-CM | POA: Diagnosis not present

## 2021-12-14 DIAGNOSIS — Z743 Need for continuous supervision: Secondary | ICD-10-CM | POA: Diagnosis not present

## 2021-12-14 DIAGNOSIS — R197 Diarrhea, unspecified: Secondary | ICD-10-CM | POA: Insufficient documentation

## 2021-12-14 DIAGNOSIS — Z794 Long term (current) use of insulin: Secondary | ICD-10-CM | POA: Diagnosis not present

## 2021-12-14 DIAGNOSIS — Z7902 Long term (current) use of antithrombotics/antiplatelets: Secondary | ICD-10-CM | POA: Diagnosis not present

## 2021-12-14 DIAGNOSIS — Z7401 Bed confinement status: Secondary | ICD-10-CM | POA: Diagnosis not present

## 2021-12-14 DIAGNOSIS — Z992 Dependence on renal dialysis: Secondary | ICD-10-CM | POA: Diagnosis not present

## 2021-12-14 DIAGNOSIS — I10 Essential (primary) hypertension: Secondary | ICD-10-CM | POA: Diagnosis not present

## 2021-12-14 DIAGNOSIS — Z79899 Other long term (current) drug therapy: Secondary | ICD-10-CM | POA: Diagnosis not present

## 2021-12-14 DIAGNOSIS — R404 Transient alteration of awareness: Secondary | ICD-10-CM | POA: Diagnosis not present

## 2021-12-14 DIAGNOSIS — R1111 Vomiting without nausea: Secondary | ICD-10-CM | POA: Diagnosis not present

## 2021-12-14 LAB — CBC WITH DIFFERENTIAL/PLATELET
Abs Immature Granulocytes: 0.01 10*3/uL (ref 0.00–0.07)
Basophils Absolute: 0 10*3/uL (ref 0.0–0.1)
Basophils Relative: 1 %
Eosinophils Absolute: 0 10*3/uL (ref 0.0–0.5)
Eosinophils Relative: 0 %
HCT: 38.3 % — ABNORMAL LOW (ref 39.0–52.0)
Hemoglobin: 12.6 g/dL — ABNORMAL LOW (ref 13.0–17.0)
Immature Granulocytes: 0 %
Lymphocytes Relative: 19 %
Lymphs Abs: 0.9 10*3/uL (ref 0.7–4.0)
MCH: 30.4 pg (ref 26.0–34.0)
MCHC: 32.9 g/dL (ref 30.0–36.0)
MCV: 92.5 fL (ref 80.0–100.0)
Monocytes Absolute: 0.4 10*3/uL (ref 0.1–1.0)
Monocytes Relative: 8 %
Neutro Abs: 3.7 10*3/uL (ref 1.7–7.7)
Neutrophils Relative %: 72 %
Platelets: 108 10*3/uL — ABNORMAL LOW (ref 150–400)
RBC: 4.14 MIL/uL — ABNORMAL LOW (ref 4.22–5.81)
RDW: 15.1 % (ref 11.5–15.5)
WBC: 5.1 10*3/uL (ref 4.0–10.5)
nRBC: 0 % (ref 0.0–0.2)

## 2021-12-14 LAB — COMPREHENSIVE METABOLIC PANEL
ALT: 19 U/L (ref 0–44)
AST: 26 U/L (ref 15–41)
Albumin: 4.4 g/dL (ref 3.5–5.0)
Alkaline Phosphatase: 168 U/L — ABNORMAL HIGH (ref 38–126)
Anion gap: 17 — ABNORMAL HIGH (ref 5–15)
BUN: 18 mg/dL (ref 6–20)
CO2: 29 mmol/L (ref 22–32)
Calcium: 9.6 mg/dL (ref 8.9–10.3)
Chloride: 92 mmol/L — ABNORMAL LOW (ref 98–111)
Creatinine, Ser: 7.15 mg/dL — ABNORMAL HIGH (ref 0.61–1.24)
GFR, Estimated: 8 mL/min — ABNORMAL LOW (ref >60)
Glucose, Bld: 263 mg/dL — ABNORMAL HIGH (ref 70–99)
Potassium: 3.3 mmol/L — ABNORMAL LOW (ref 3.5–5.1)
Sodium: 138 mmol/L (ref 135–145)
Total Bilirubin: 0.8 mg/dL (ref 0.3–1.2)
Total Protein: 8.1 g/dL (ref 6.5–8.1)

## 2021-12-14 LAB — LIPASE, BLOOD: Lipase: 25 U/L (ref 11–51)

## 2021-12-14 MED ORDER — DIPHENHYDRAMINE HCL 50 MG/ML IJ SOLN
25.0000 mg | Freq: Once | INTRAMUSCULAR | Status: AC
  Administered 2021-12-14: 25 mg via INTRAVENOUS
  Filled 2021-12-14: qty 1

## 2021-12-14 MED ORDER — METOCLOPRAMIDE HCL 5 MG/ML IJ SOLN
10.0000 mg | Freq: Once | INTRAMUSCULAR | Status: AC
  Administered 2021-12-14: 10 mg via INTRAVENOUS
  Filled 2021-12-14: qty 2

## 2021-12-14 MED ORDER — ALUM & MAG HYDROXIDE-SIMETH 200-200-20 MG/5ML PO SUSP
30.0000 mL | Freq: Once | ORAL | Status: AC
  Administered 2021-12-14: 30 mL via ORAL
  Filled 2021-12-14: qty 30

## 2021-12-14 NOTE — Discharge Instructions (Signed)
Your blood work is not consistent with you losing blood.  Please return for worsening or recurrent episodes.  Please follow-up with your family doctor in the office.

## 2021-12-14 NOTE — ED Notes (Signed)
Tried calling [facility] to give report no answer.

## 2021-12-14 NOTE — ED Provider Notes (Signed)
[facility] EMERGENCY DEPARTMENT Provider Note   CSN: [id number] Arrival date & time: 12/14/21  1043     History  Chief Complaint  Patient presents with   Emesis    Jorge [name] is a 51 y.o. male.  51 yo M with a chief complaints of possibly throwing up blood.  This was noted by his dialysis center today.  Patient does not remember anything happening.  Sounds like he may have passed out.  He feels like his stomach has been upset.  He has chronic diarrhea and actually has not had a bowel movement in a few days.  Denies overt abdominal pain.  Denies fevers.  Is on Plavix.   Emesis      Home Medications Prior to Admission medications   Medication Sig Start Date End Date Taking? Authorizing Provider  amLODipine (NORVASC) 10 MG tablet Take 1 tablet (10 mg total) by mouth daily. 05/12/21 06/11/21  Darliss Cheney, MD  ARTIFICIAL TEARS 0.2-0.2-1 % SOLN Place 1 drop into both eyes 4 (four) times daily.    [provider]  atorvastatin (LIPITOR) 40 MG tablet Take 1 tablet (40 mg total) by mouth daily. 07/30/20   Little Ishikawa, MD  clopidogrel (PLAVIX) 75 MG tablet Take 1 tablet (75 mg total) by mouth daily. 02/14/21   Frann Rider, NP  diclofenac Sodium (VOLTAREN) 1 % GEL Apply 2 g topically See admin instructions. Apply 2 grams of gel to the left hand three times a day 01/29/21   [provider]  furosemide (LASIX) 20 MG tablet Take 20 mg by mouth in the morning and at bedtime. 01/13/21   [provider]  gabapentin (NEURONTIN) 300 MG capsule Take 300 mg by mouth 3 (three) times daily.    [provider]  glucagon (GLUCAGEN) 1 MG SOLR injection Inject 1 mg into the muscle as needed for low blood sugar.    [provider]  HUMALOG KWIKPEN 100 UNIT/ML KwikPen Inject 0-10 Units into the skin See admin instructions. Inject 0-10 units into the skin three times a day with meals, per sliding scale: BGL 0-150 = give nothing; 151-200 =  2 units; 201-250 = 4 units; 251-300 = 6 units; 301-350 = 8 units; 351-400 = 10 units; call PCP if <70 or >400 06/22/20   [provider]  LANTUS SOLOSTAR 100 UNIT/ML Solostar Pen Inject 5 Units into the skin at bedtime. 12/17/20   [provider]  loperamide (IMODIUM A-D) 2 MG tablet Take 2-4 mg by mouth See admin instructions. Take 4 mg as an initial dose, then decrease to 2 mg after each unformed stool- not to exceed 24 mg in 48 hours and call MD if diarrhea persists greater than 48 hours    [provider]  Multiple Vitamin (MULTIVITAMIN) tablet Take 1 tablet by mouth at bedtime.    [provider]  Multiple Vitamins-Minerals (PRORENAL + D) TABS Take 1,000 Units by mouth in the morning.    [provider]  Nutritional Supplements (ENSURE CLEAR) LIQD Take 240 mLs by mouth See admin instructions. Mix 240 ml's with sugar substitute and drink every morning    [provider]  OXYGEN Inhale 2 L/min into the lungs as needed (if sats drop below 92% during episodes of anxiety).    [provider]  PREPARATION H 1-0.25-14.4-15 % CREA Apply 1 application. topically every 6 (six) hours as needed (for hemorrhoids).    [provider]  promethazine (PHENERGAN) 25 MG  tablet Take 25 mg by mouth every 6 (six) hours as needed for nausea or vomiting.    [provider]  REFRESH LIQUIGEL 1 % GEL Place 1 drop into both eyes at bedtime.    [provider]  sevelamer carbonate (RENVELA) 800 MG tablet Take 1,600-3,200 mg by mouth 2 (two) times daily. Take 1,600 mg by mouth two times a day (at 2 PM and 9 PM) and 3,200 mg three times a day "for dialysis" (at 7:30 AM, 12 NOON, and 5 PM) 01/18/21   [provider]  Vitamin D, Ergocalciferol, (DRISDOL) 1.25 MG (50000 UNIT) CAPS capsule Take 50,000 Units by mouth every Saturday. 07/14/19   [provider]  White Petrolatum-Mineral Oil (REFRESH LACRI-LUBE) [place] into both  eyes See admin instructions. Place 0.25 ribbon into each eye at bedtime    [provider]      Allergies    Lactose intolerance (gi)    Review of Systems   Review of Systems  Gastrointestinal:  Positive for vomiting.    Physical Exam Updated Vital Signs BP (!) 194/109   Pulse 93   Temp 98.4 F (36.9 C) (Oral)   Resp 12   SpO2 100%  Physical Exam Vitals and nursing note reviewed.  Constitutional:      Appearance: He is well-developed.     Comments: Chronically ill-appearing.  HENT:     Head: Normocephalic and atraumatic.  Eyes:     Pupils: Pupils are equal, round, and reactive to light.  Neck:     Vascular: No JVD.  Cardiovascular:     Rate and Rhythm: Normal rate and regular rhythm.     Heart sounds: No murmur heard.    No friction rub. No gallop.  Pulmonary:     Effort: No respiratory distress.     Breath sounds: No wheezing.  Abdominal:     General: There is no distension.     Tenderness: There is no abdominal tenderness. There is no guarding or rebound.     Comments: No obvious abdominal discomfort on exam  Musculoskeletal:        General: Normal range of motion.     Cervical back: Normal range of motion and neck supple.     Comments: Bilateral AKA.  Skin:    Coloration: Skin is not pale.     Findings: No rash.  Neurological:     Mental Status: He is alert and oriented to person, place, and time.  Psychiatric:        Behavior: Behavior normal.     ED Results / Procedures / Treatments   Labs (all labs ordered are listed, but only abnormal results are displayed) Labs Reviewed  CBC WITH DIFFERENTIAL/PLATELET - Abnormal; Notable for the following components:      Result Value   RBC 4.14 (*)    Hemoglobin 12.6 (*)    HCT 38.3 (*)    Platelets 108 (*)    All other components within normal limits  COMPREHENSIVE METABOLIC PANEL - Abnormal; Notable for the following components:   Potassium 3.3 (*)    Chloride 92 (*)    Glucose, Bld 263 (*)     Creatinine, Ser 7.15 (*)    Alkaline Phosphatase 168 (*)    GFR, Estimated 8 (*)    Anion gap 17 (*)    All other components within normal limits  LIPASE, BLOOD    EKG EKG Interpretation  Date/Time:  Thursday December 14 2021 10:49:30 EST Ventricular Rate:  91 PR Interval:  180 QRS Duration: 95 QT Interval:  417 QTC Calculation: 514 R Axis:   61 Text Interpretation: Sinus rhythm Minimal ST depression, lateral leads Minimal ST elevation, inferior leads Prolonged QT interval No significant change since last tracing Confirmed by Deno Etienne [phone]) on 12/14/2021 10:54:18 AM  Radiology No results found.  Procedures Procedures    Medications Ordered in ED Medications  metoCLOPramide (REGLAN) injection 10 mg (10 mg Intravenous Given 12/14/21 1222)  diphenhydrAMINE (BENADRYL) injection 25 mg (25 mg Intravenous Given 12/14/21 1218)  alum & mag hydroxide-simeth (MAALOX/MYLANTA) 200-200-20 MG/5ML suspension 30 mL (30 mLs Oral Given 12/14/21 1220)    ED Course/ Medical Decision Making/ A&P                           Medical Decision Making Amount and/or Complexity of Data Reviewed Labs: ordered.  Risk OTC drugs. Prescription drug management.   51 yo M with a chief complaints of feeling like his stomach is upset.  This been going on for couple days.  He had an episode of emesis today at dialysis and may be passed out and was sent here for evaluation.  The bystanders were concerned for possible upper GI bleed.  Patient denies any bleeding preceding.  States his stomach still feels upset.  Denies other specific complaint.  Patient's hemoglobin is higher than last check.  He has had no episodes of emesis while here in the emergency department.  Able to tolerate by mouth.  Will discharge home.  PCP follow-up.  1:15 PM:  I have discussed the diagnosis/risks/treatment options with the patient.  Evaluation and diagnostic testing in the emergency department does not suggest an emergent  condition requiring admission or immediate intervention beyond what has been performed at this time.  They will follow up with PCP. We also discussed returning to the ED immediately if new or worsening sx occur. We discussed the sx which are most concerning (e.g., sudden worsening pain, fever, inability to tolerate by mouth) that necessitate immediate return. Medications administered to the patient during their visit and any new prescriptions provided to the patient are listed below.  Medications given during this visit Medications  metoCLOPramide (REGLAN) injection 10 mg (10 mg Intravenous Given 12/14/21 1222)  diphenhydrAMINE (BENADRYL) injection 25 mg (25 mg Intravenous Given 12/14/21 1218)  alum & mag hydroxide-simeth (MAALOX/MYLANTA) 200-200-20 MG/5ML suspension 30 mL (30 mLs Oral Given 12/14/21 1220)     The patient appears reasonably screen and/or stabilized for discharge and I doubt any other medical condition or other [facility] requiring further screening, evaluation, or treatment in the ED at this time prior to discharge.          Final Clinical Impression(s) / ED Diagnoses Final diagnoses:  Nausea and vomiting in adult    Rx / DC Orders ED Discharge Orders     None         Deno Etienne, DO 12/14/21 1315

## 2021-12-14 NOTE — ED Triage Notes (Signed)
Patient bib [facility] from [facility] with complaints of coffee ground emesis x2days that is progressively getting worse. Patient is alert and oriented. He is dialysis patient and has not missed any days. VSS

## 2021-12-15 DIAGNOSIS — R197 Diarrhea, unspecified: Secondary | ICD-10-CM | POA: Diagnosis not present

## 2021-12-15 DIAGNOSIS — R110 Nausea: Secondary | ICD-10-CM | POA: Diagnosis not present

## 2021-12-15 DIAGNOSIS — E1122 Type 2 diabetes mellitus with diabetic chronic kidney disease: Secondary | ICD-10-CM | POA: Diagnosis not present

## 2021-12-15 DIAGNOSIS — L299 Pruritus, unspecified: Secondary | ICD-10-CM | POA: Diagnosis not present

## 2021-12-15 DIAGNOSIS — N2581 Secondary hyperparathyroidism of renal origin: Secondary | ICD-10-CM | POA: Diagnosis not present

## 2021-12-15 DIAGNOSIS — Z992 Dependence on renal dialysis: Secondary | ICD-10-CM | POA: Diagnosis not present

## 2021-12-15 DIAGNOSIS — N186 End stage renal disease: Secondary | ICD-10-CM | POA: Diagnosis not present

## 2021-12-15 DIAGNOSIS — D631 Anemia in chronic kidney disease: Secondary | ICD-10-CM | POA: Diagnosis not present

## 2021-12-15 DIAGNOSIS — R109 Unspecified abdominal pain: Secondary | ICD-10-CM | POA: Diagnosis not present

## 2021-12-16 ENCOUNTER — Emergency Department (HOSPITAL_COMMUNITY)
Admission: EM | Admit: 2021-12-16 | Discharge: 2021-12-16 | Disposition: A | Discharge status: Skilled Nursing Facility | Payer: Medicare Other | Attending: Emergency Medicine | Admitting: Emergency Medicine

## 2021-12-16 ENCOUNTER — Other Ambulatory Visit: Payer: Self-pay

## 2021-12-16 ENCOUNTER — Emergency Department (HOSPITAL_COMMUNITY): Payer: Medicare Other

## 2021-12-16 DIAGNOSIS — Z794 Long term (current) use of insulin: Secondary | ICD-10-CM | POA: Insufficient documentation

## 2021-12-16 DIAGNOSIS — Z992 Dependence on renal dialysis: Secondary | ICD-10-CM | POA: Insufficient documentation

## 2021-12-16 DIAGNOSIS — K5732 Diverticulitis of large intestine without perforation or abscess without bleeding: Secondary | ICD-10-CM | POA: Diagnosis not present

## 2021-12-16 DIAGNOSIS — I700 Atherosclerosis of aorta: Secondary | ICD-10-CM | POA: Diagnosis not present

## 2021-12-16 DIAGNOSIS — R5383 Other fatigue: Secondary | ICD-10-CM | POA: Diagnosis not present

## 2021-12-16 DIAGNOSIS — K5900 Constipation, unspecified: Secondary | ICD-10-CM | POA: Diagnosis not present

## 2021-12-16 DIAGNOSIS — Z743 Need for continuous supervision: Secondary | ICD-10-CM | POA: Diagnosis not present

## 2021-12-16 DIAGNOSIS — E860 Dehydration: Secondary | ICD-10-CM | POA: Insufficient documentation

## 2021-12-16 DIAGNOSIS — Z7902 Long term (current) use of antithrombotics/antiplatelets: Secondary | ICD-10-CM | POA: Insufficient documentation

## 2021-12-16 DIAGNOSIS — R1032 Left lower quadrant pain: Secondary | ICD-10-CM | POA: Diagnosis present

## 2021-12-16 DIAGNOSIS — I10 Essential (primary) hypertension: Secondary | ICD-10-CM | POA: Diagnosis not present

## 2021-12-16 DIAGNOSIS — N186 End stage renal disease: Secondary | ICD-10-CM | POA: Insufficient documentation

## 2021-12-16 DIAGNOSIS — Z79899 Other long term (current) drug therapy: Secondary | ICD-10-CM | POA: Diagnosis not present

## 2021-12-16 DIAGNOSIS — E1122 Type 2 diabetes mellitus with diabetic chronic kidney disease: Secondary | ICD-10-CM | POA: Insufficient documentation

## 2021-12-16 DIAGNOSIS — K5792 Diverticulitis of intestine, part unspecified, without perforation or abscess without bleeding: Secondary | ICD-10-CM

## 2021-12-16 DIAGNOSIS — R6889 Other general symptoms and signs: Secondary | ICD-10-CM | POA: Diagnosis not present

## 2021-12-16 DIAGNOSIS — R531 Weakness: Secondary | ICD-10-CM | POA: Diagnosis not present

## 2021-12-16 DIAGNOSIS — R109 Unspecified abdominal pain: Secondary | ICD-10-CM | POA: Diagnosis not present

## 2021-12-16 DIAGNOSIS — Z7401 Bed confinement status: Secondary | ICD-10-CM | POA: Diagnosis not present

## 2021-12-16 LAB — LIPASE, BLOOD: Lipase: 26 U/L (ref 11–51)

## 2021-12-16 LAB — CBC
HCT: 40.7 % (ref 39.0–52.0)
Hemoglobin: 12.9 g/dL — ABNORMAL LOW (ref 13.0–17.0)
MCH: 30.3 pg (ref 26.0–34.0)
MCHC: 31.7 g/dL (ref 30.0–36.0)
MCV: 95.5 fL (ref 80.0–100.0)
Platelets: 103 10*3/uL — ABNORMAL LOW (ref 150–400)
RBC: 4.26 MIL/uL (ref 4.22–5.81)
RDW: 14.9 % (ref 11.5–15.5)
WBC: 12.1 10*3/uL — ABNORMAL HIGH (ref 4.0–10.5)
nRBC: 0 % (ref 0.0–0.2)

## 2021-12-16 LAB — COMPREHENSIVE METABOLIC PANEL
ALT: 19 U/L (ref 0–44)
AST: 31 U/L (ref 15–41)
Albumin: 4.1 g/dL (ref 3.5–5.0)
Alkaline Phosphatase: 156 U/L — ABNORMAL HIGH (ref 38–126)
Anion gap: 16 — ABNORMAL HIGH (ref 5–15)
BUN: 37 mg/dL — ABNORMAL HIGH (ref 6–20)
CO2: 32 mmol/L (ref 22–32)
Calcium: 9.5 mg/dL (ref 8.9–10.3)
Chloride: 92 mmol/L — ABNORMAL LOW (ref 98–111)
Creatinine, Ser: 6.99 mg/dL — ABNORMAL HIGH (ref 0.61–1.24)
GFR, Estimated: 9 mL/min — ABNORMAL LOW (ref >60)
Glucose, Bld: 222 mg/dL — ABNORMAL HIGH (ref 70–99)
Potassium: 4.1 mmol/L (ref 3.5–5.1)
Sodium: 140 mmol/L (ref 135–145)
Total Bilirubin: 1.1 mg/dL (ref 0.3–1.2)
Total Protein: 8.3 g/dL — ABNORMAL HIGH (ref 6.5–8.1)

## 2021-12-16 LAB — CBG MONITORING, ED: Glucose-Capillary: 222 mg/dL — ABNORMAL HIGH (ref 70–99)

## 2021-12-16 MED ORDER — METOCLOPRAMIDE HCL 5 MG/ML IJ SOLN
10.0000 mg | Freq: Once | INTRAMUSCULAR | Status: AC
  Administered 2021-12-16: 10 mg via INTRAVENOUS
  Filled 2021-12-16: qty 2

## 2021-12-16 MED ORDER — PANTOPRAZOLE SODIUM 40 MG IV SOLR
40.0000 mg | Freq: Once | INTRAVENOUS | Status: AC
  Administered 2021-12-16: 40 mg via INTRAVENOUS
  Filled 2021-12-16: qty 10

## 2021-12-16 MED ORDER — ONDANSETRON 4 MG PO TBDP
4.0000 mg | ORAL_TABLET | Freq: Once | ORAL | Status: AC
Start: 2021-12-16 — End: 2021-12-16
  Administered 2021-12-16: 4 mg via ORAL
  Filled 2021-12-16: qty 1

## 2021-12-16 MED ORDER — SODIUM CHLORIDE 0.9 % IV BOLUS
250.0000 mL | Freq: Once | INTRAVENOUS | Status: AC
  Administered 2021-12-16: 250 mL via INTRAVENOUS

## 2021-12-16 MED ORDER — AMOXICILLIN-POT CLAVULANATE 875-125 MG PO TABS
1.0000 | ORAL_TABLET | Freq: Two times a day (BID) | ORAL | 0 refills | Status: DC
Start: 2021-12-16 — End: 2022-05-19

## 2021-12-16 MED ORDER — PROMETHAZINE HCL 25 MG RE SUPP: 25.0000 mg | Freq: Four times a day (QID) | RECTAL | 0 refills | Status: DC | PRN

## 2021-12-16 NOTE — Discharge Instructions (Signed)
Continue with Zofran for nausea, but if that does not work you can use the Phenergan suppositories.  Go ahead and start taking the antibiotic for possible diverticulitis and follow-up this week for recheck

## 2021-12-16 NOTE — ED Triage Notes (Signed)
Pt BIBA from [facility]. C/o abd px for several days similar to when they've had a gastroparesis attack. Has not been able to keep down fluids or take PO meds.  Aox4  Dialysis pt, MWF. Fistula in R arm  BP: 174/90 HR: 76 SPO2: 99 RA CBG: 203

## 2021-12-16 NOTE — ED Provider Notes (Signed)
[facility] DEPT Provider Note   CSN: [id number] Arrival date & time: 12/16/21  4401     History {Add pertinent medical, surgical, social history, OB history to HPI:1} Chief Complaint  Patient presents with   Abdominal Pain   Emesis    Jorge [name] is a 51 y.o. male.  Patient has a history of renal disease and diabetes.  He presents today with vomiting.  He also has some mild left lower quadrant pain   Abdominal Pain Associated symptoms: vomiting   Emesis Associated symptoms: abdominal pain        Home Medications Prior to Admission medications   Medication Sig Start Date End Date Taking? Authorizing Provider  amoxicillin-clavulanate (AUGMENTIN) 875-125 MG tablet Take 1 tablet by mouth every 12 (twelve) hours. 12/16/21  Yes Milton Ferguson, MD  atorvastatin (LIPITOR) 40 MG tablet Take 1 tablet (40 mg total) by mouth daily. 07/30/20  Yes Little Ishikawa, MD  clopidogrel (PLAVIX) 75 MG tablet Take 1 tablet (75 mg total) by mouth daily. 02/14/21  Yes McCue, Janett Billow, NP  diclofenac Sodium (VOLTAREN) 1 % GEL Apply 2 g topically See admin instructions. Apply 2 grams of gel to the left hand three times a day 01/29/21  Yes [provider]  furosemide (LASIX) 20 MG tablet Take 20 mg by mouth in the morning and at bedtime. 01/13/21  Yes [provider]  glucagon (GLUCAGEN) 1 MG SOLR injection Inject 1 mg into the muscle as needed for low blood sugar.   Yes [provider]  HUMALOG KWIKPEN 100 UNIT/ML KwikPen Inject 0-8 Units into the skin See admin instructions. Inject 0-10 units into the skin three times a day with meals, per sliding scale: BGL 0-150 = give nothing; 151-200 =  0 units; 201-250 = 2 units; 251-300 = 4 units; 301-350 = 6 units; 351-400 = 8 units; call PCP if <70 or >400 06/22/20  Yes [provider]  LANTUS SOLOSTAR 100 UNIT/ML Solostar Pen Inject 5 Units into the skin at bedtime. 12/17/20  Yes [provider]  loperamide (IMODIUM A-D) 2 MG tablet Take 2-4 mg by mouth See admin instructions. Take 4 mg as an initial dose, then decrease to 2 mg after each unformed stool- not to exceed 24 mg in 48 hours and call MD if diarrhea persists greater than 48 hours   Yes [provider]  LORazepam (ATIVAN) 0.5 MG tablet Take 0.5 mg by mouth every Monday, Wednesday, and Friday with hemodialysis. Before Dialysis due to anxiety 11/27/21  Yes [provider]  LYRICA 25 MG capsule Take 25 mg by mouth 3 (three) times a week. On Mon, Wed, Friday after Dialysis 12/06/21  Yes [provider]  Melatonin 10 MG TABS Take 10 mg by mouth at bedtime.   Yes [provider]  Multiple Vitamin (MULTIVITAMIN) tablet Take 1 tablet by mouth at bedtime.   Yes [provider]  Multiple Vitamins-Minerals (PRORENAL + D) TABS Take 1,000 Units by mouth in the morning.   Yes [provider]  Nutritional Supplements (ENSURE CLEAR) LIQD Take 240 mLs by mouth See admin instructions. Mix 240 ml's with sugar substitute and drink every morning   Yes [provider]  OXYGEN Inhale 2 L/min into the lungs as needed (if sats drop below 92% during episodes of anxiety).   Yes [provider]  pregabalin (LYRICA) 25 MG capsule Take 25 mg by mouth daily.   Yes [provider]  PREPARATION H  1-0.25-14.4-15 % CREA Apply 1 application. topically every 6 (six) hours as needed (for hemorrhoids).   Yes [provider]  promethazine (PHENERGAN) 25 MG suppository Place 1 suppository (25 mg total) rectally every 6 (six) hours as needed for nausea or vomiting. 12/16/21  Yes Milton Ferguson, MD  REFRESH LIQUIGEL 1 % GEL Place 1 drop into both eyes at bedtime.   Yes [provider]  sevelamer carbonate (RENVELA) 800 MG tablet Take 1,600-3,200 mg by mouth 2 (two) times daily. Take 1,600 mg by mouth two times a day (at 2 PM and 9 PM) and 3,200 mg three times a day  "for dialysis" (at 7:30 AM, 12 NOON, and 5 PM) 01/18/21  Yes [provider]  traMADol (ULTRAM) 50 MG tablet Take 25 mg by mouth every 6 (six) hours as needed for moderate pain. 11/01/21  Yes [provider]  traZODone (DESYREL) 50 MG tablet Take 50 mg by mouth at bedtime. 11/29/21  Yes [provider]  Vitamin D, Ergocalciferol, (DRISDOL) 1.25 MG (50000 UNIT) CAPS capsule Take 50,000 Units by mouth every Saturday. 07/14/19  Yes [provider]  Jay Schlichter Oil (REFRESH LACRI-LUBE) Interior into both eyes See admin instructions. Place 0.25 ribbon into each eye at bedtime   Yes [provider]  ARTIFICIAL TEARS 0.2-0.2-1 % SOLN Place 1 drop into both eyes 4 (four) times daily.    [provider]  gabapentin (NEURONTIN) 300 MG capsule Take 300 mg by mouth 3 (three) times daily. Patient not taking: Reported on 12/16/2021    [provider]      Allergies    Lactose intolerance (gi)    Review of Systems   Review of Systems  Gastrointestinal:  Positive for abdominal pain and vomiting.    Physical Exam Updated Vital Signs BP (!) 161/91   Pulse 86   Temp 98.4 F (36.9 C) (Oral)   Resp (!) 24   Ht 5\' 11"  (1.803 m)   Wt 71 kg   SpO2 99%   BMI 21.83 kg/m  Physical Exam  ED Results / Procedures / Treatments   Labs (all labs ordered are listed, but only abnormal results are displayed) Labs Reviewed  COMPREHENSIVE METABOLIC PANEL - Abnormal; Notable for the following components:      Result Value   Chloride 92 (*)    Glucose, Bld 222 (*)    BUN 37 (*)    Creatinine, Ser 6.99 (*)    Total Protein 8.3 (*)    Alkaline Phosphatase 156 (*)    GFR, Estimated 9 (*)    Anion gap 16 (*)    All other components within normal limits  CBC - Abnormal; Notable for the following components:   WBC 12.1 (*)    Hemoglobin 12.9 (*)    Platelets 103 (*)    All other components within normal limits  CBG MONITORING, ED -  Abnormal; Notable for the following components:   Glucose-Capillary 222 (*)    All other components within normal limits  LIPASE, BLOOD  CBG MONITORING, ED    EKG None  Radiology CT ABDOMEN PELVIS WO CONTRAST  Result Date: 12/16/2021 CLINICAL DATA:  51 year old male with abdominal pain and vomiting. EXAM: CT ABDOMEN AND PELVIS WITHOUT CONTRAST TECHNIQUE: Multidetector CT imaging of the abdomen and pelvis was performed following the standard protocol without IV contrast. RADIATION DOSE REDUCTION: This exam was performed according to the departmental dose-optimization program which includes automated exposure control, adjustment of the mA and/or  kV according to patient size and/or use of iterative reconstruction technique. COMPARISON:  Chest CT 07/14/2020. FINDINGS: Lower chest: Peripheral scattered granular pleural or parenchymal calcifications in the left lower lobe are chronic. And there is linear and dependent residual pulmonary scarring or atelectasis in the right lower lobe which was consolidated on the CT last year. No cardiomegaly. No pericardial or pleural effusion. Hepatobiliary: Negative noncontrast liver and gallbladder. Pancreas: Difficult to identify, probably partially atrophied. Spleen: Negative. Adrenals/Urinary Tract: Symmetric renal atrophy. Renal vascular calcifications and/or nephrolithiasis. Negative adrenal glands. Decompressed bladder. Stomach/Bowel: Limited bowel detail due to paucity of intra-abdominal fat and noncontrast technique. Retained large bowel stool with no abnormally dilated loops. However, right colon appears somewhat decompressed and abnormally thickened with underlying diverticulosis of the ascending colon (series 2, image 39, coronal image 68). But no convincing mesenteric inflammation. Probable normal appendix visible on coronal image 63. No dilated small bowel. Air in fluid in the stomach. No free air or free fluid identified. Vascular/Lymphatic: Diffuse severe  calcified atherosclerosis. Calcified aortic atherosclerosis. Vascular patency is not evaluated in the absence of IV contrast. Normal caliber abdominal aorta. No lymphadenopathy is evident on this noncontrast exam. Reproductive: Negative. Other: Diffuse pelvic vascular calcifications. No free fluid is evident. Musculoskeletal: Mild diffuse increased bony sclerosis, probably renal osteodystrophy in this setting. No acute osseous abnormality identified. IMPRESSION: 1. Constellation suspicious for End stage renal disease with bilateral renal atrophy, severe diffuse calcified atherosclerosis, evidence of renal osteodystrophy. Aortic Atherosclerosis (ICD10-I70.0). 2. Limited bowel detail with paucity of intra-abdominal fat and noncontrast technique. Questionable circumferential wall thickening of the right colon with underlying diverticulosis. A mild ascending colitis or diverticulitis would be difficult to exclude, although there is no mesenteric inflammation. No free fluid. 3. Right lower lobe scarring and atelectasis at the site of lower lobe pneumonia last year. Left lung base chronic alveolar or postinflammatory microlithiasis. Electronically Signed   By: Genevie Ann M.D.   On: 12/16/2021 10:38    Procedures Procedures  {Document cardiac monitor, telemetry assessment procedure when appropriate:1}  Medications Ordered in ED Medications  ondansetron (ZOFRAN-ODT) disintegrating tablet 4 mg (4 mg Oral Given 12/16/21 1007)  sodium chloride 0.9 % bolus 250 mL (250 mLs Intravenous New Bag/Given 12/16/21 1113)  metoCLOPramide (REGLAN) injection 10 mg (10 mg Intravenous Given 12/16/21 1111)  pantoprazole (PROTONIX) injection 40 mg (40 mg Intravenous Given 12/16/21 1111)    ED Course/ Medical Decision Making/ A&P  CT scan shows possible diverticulitis.  Is also mildly dehydrated.                         Medical Decision Making Amount and/or Complexity of Data Reviewed Labs: ordered. Radiology:  ordered.  Risk Prescription drug management.   Patient with vomiting and abdominal pain.  Possible diverticulitis he is given Phenergan suppositories and has been given a small bolus of normal saline along with Augmentin to take at home  {Document critical care time when appropriate:1} {Document review of labs and clinical decision tools ie heart score, Chads2Vasc2 etc:1}  {Document your independent review of radiology images, and any outside records:1} {Document your discussion with family members, caretakers, and with consultants:1} {Document social determinants of health affecting pt's care:1} {Document your decision making why or why not admission, treatments were needed:1} Final Clinical Impression(s) / ED Diagnoses Final diagnoses:  Diverticulitis    Rx / DC Orders ED Discharge Orders          Ordered    promethazine (PHENERGAN)  25 MG suppository  Every 6 hours PRN        12/16/21 1151    amoxicillin-clavulanate (AUGMENTIN) 875-125 MG tablet  Every 12 hours        12/16/21 1151

## 2021-12-18 DIAGNOSIS — D631 Anemia in chronic kidney disease: Secondary | ICD-10-CM | POA: Diagnosis not present

## 2021-12-18 DIAGNOSIS — M6281 Muscle weakness (generalized): Secondary | ICD-10-CM | POA: Diagnosis not present

## 2021-12-18 DIAGNOSIS — R197 Diarrhea, unspecified: Secondary | ICD-10-CM | POA: Diagnosis not present

## 2021-12-18 DIAGNOSIS — E1122 Type 2 diabetes mellitus with diabetic chronic kidney disease: Secondary | ICD-10-CM | POA: Diagnosis not present

## 2021-12-18 DIAGNOSIS — L299 Pruritus, unspecified: Secondary | ICD-10-CM | POA: Diagnosis not present

## 2021-12-18 DIAGNOSIS — R2689 Other abnormalities of gait and mobility: Secondary | ICD-10-CM | POA: Diagnosis not present

## 2021-12-18 DIAGNOSIS — N2581 Secondary hyperparathyroidism of renal origin: Secondary | ICD-10-CM | POA: Diagnosis not present

## 2021-12-18 DIAGNOSIS — N186 End stage renal disease: Secondary | ICD-10-CM | POA: Diagnosis not present

## 2021-12-18 DIAGNOSIS — I629 Nontraumatic intracranial hemorrhage, unspecified: Secondary | ICD-10-CM | POA: Diagnosis not present

## 2021-12-18 DIAGNOSIS — Z992 Dependence on renal dialysis: Secondary | ICD-10-CM | POA: Diagnosis not present

## 2021-12-19 DIAGNOSIS — M6281 Muscle weakness (generalized): Secondary | ICD-10-CM | POA: Diagnosis not present

## 2021-12-19 DIAGNOSIS — I629 Nontraumatic intracranial hemorrhage, unspecified: Secondary | ICD-10-CM | POA: Diagnosis not present

## 2021-12-19 DIAGNOSIS — R2689 Other abnormalities of gait and mobility: Secondary | ICD-10-CM | POA: Diagnosis not present

## 2021-12-20 DIAGNOSIS — Z992 Dependence on renal dialysis: Secondary | ICD-10-CM | POA: Diagnosis not present

## 2021-12-20 DIAGNOSIS — M6281 Muscle weakness (generalized): Secondary | ICD-10-CM | POA: Diagnosis not present

## 2021-12-20 DIAGNOSIS — K5792 Diverticulitis of intestine, part unspecified, without perforation or abscess without bleeding: Secondary | ICD-10-CM | POA: Diagnosis not present

## 2021-12-20 DIAGNOSIS — L299 Pruritus, unspecified: Secondary | ICD-10-CM | POA: Diagnosis not present

## 2021-12-20 DIAGNOSIS — D631 Anemia in chronic kidney disease: Secondary | ICD-10-CM | POA: Diagnosis not present

## 2021-12-20 DIAGNOSIS — N186 End stage renal disease: Secondary | ICD-10-CM | POA: Diagnosis not present

## 2021-12-20 DIAGNOSIS — E1122 Type 2 diabetes mellitus with diabetic chronic kidney disease: Secondary | ICD-10-CM | POA: Diagnosis not present

## 2021-12-20 DIAGNOSIS — R197 Diarrhea, unspecified: Secondary | ICD-10-CM | POA: Diagnosis not present

## 2021-12-20 DIAGNOSIS — R2689 Other abnormalities of gait and mobility: Secondary | ICD-10-CM | POA: Diagnosis not present

## 2021-12-20 DIAGNOSIS — I629 Nontraumatic intracranial hemorrhage, unspecified: Secondary | ICD-10-CM | POA: Diagnosis not present

## 2021-12-20 DIAGNOSIS — N2581 Secondary hyperparathyroidism of renal origin: Secondary | ICD-10-CM | POA: Diagnosis not present

## 2021-12-22 DIAGNOSIS — I629 Nontraumatic intracranial hemorrhage, unspecified: Secondary | ICD-10-CM | POA: Diagnosis not present

## 2021-12-22 DIAGNOSIS — D631 Anemia in chronic kidney disease: Secondary | ICD-10-CM | POA: Diagnosis not present

## 2021-12-22 DIAGNOSIS — R2689 Other abnormalities of gait and mobility: Secondary | ICD-10-CM | POA: Diagnosis not present

## 2021-12-22 DIAGNOSIS — R197 Diarrhea, unspecified: Secondary | ICD-10-CM | POA: Diagnosis not present

## 2021-12-22 DIAGNOSIS — N186 End stage renal disease: Secondary | ICD-10-CM | POA: Diagnosis not present

## 2021-12-22 DIAGNOSIS — Z992 Dependence on renal dialysis: Secondary | ICD-10-CM | POA: Diagnosis not present

## 2021-12-22 DIAGNOSIS — M6281 Muscle weakness (generalized): Secondary | ICD-10-CM | POA: Diagnosis not present

## 2021-12-22 DIAGNOSIS — N2581 Secondary hyperparathyroidism of renal origin: Secondary | ICD-10-CM | POA: Diagnosis not present

## 2021-12-22 DIAGNOSIS — E1122 Type 2 diabetes mellitus with diabetic chronic kidney disease: Secondary | ICD-10-CM | POA: Diagnosis not present

## 2021-12-22 DIAGNOSIS — L299 Pruritus, unspecified: Secondary | ICD-10-CM | POA: Diagnosis not present

## 2021-12-25 DIAGNOSIS — Z992 Dependence on renal dialysis: Secondary | ICD-10-CM | POA: Diagnosis not present

## 2021-12-25 DIAGNOSIS — D631 Anemia in chronic kidney disease: Secondary | ICD-10-CM | POA: Diagnosis not present

## 2021-12-25 DIAGNOSIS — R197 Diarrhea, unspecified: Secondary | ICD-10-CM | POA: Diagnosis not present

## 2021-12-25 DIAGNOSIS — E1122 Type 2 diabetes mellitus with diabetic chronic kidney disease: Secondary | ICD-10-CM | POA: Diagnosis not present

## 2021-12-25 DIAGNOSIS — N2581 Secondary hyperparathyroidism of renal origin: Secondary | ICD-10-CM | POA: Diagnosis not present

## 2021-12-25 DIAGNOSIS — N186 End stage renal disease: Secondary | ICD-10-CM | POA: Diagnosis not present

## 2021-12-25 DIAGNOSIS — L299 Pruritus, unspecified: Secondary | ICD-10-CM | POA: Diagnosis not present

## 2021-12-27 DIAGNOSIS — D631 Anemia in chronic kidney disease: Secondary | ICD-10-CM | POA: Diagnosis not present

## 2021-12-27 DIAGNOSIS — Z992 Dependence on renal dialysis: Secondary | ICD-10-CM | POA: Diagnosis not present

## 2021-12-27 DIAGNOSIS — G629 Polyneuropathy, unspecified: Secondary | ICD-10-CM | POA: Diagnosis not present

## 2021-12-27 DIAGNOSIS — N186 End stage renal disease: Secondary | ICD-10-CM | POA: Diagnosis not present

## 2021-12-27 DIAGNOSIS — K5792 Diverticulitis of intestine, part unspecified, without perforation or abscess without bleeding: Secondary | ICD-10-CM | POA: Diagnosis not present

## 2021-12-27 DIAGNOSIS — N2581 Secondary hyperparathyroidism of renal origin: Secondary | ICD-10-CM | POA: Diagnosis not present

## 2021-12-27 DIAGNOSIS — E1122 Type 2 diabetes mellitus with diabetic chronic kidney disease: Secondary | ICD-10-CM | POA: Diagnosis not present

## 2021-12-27 DIAGNOSIS — R197 Diarrhea, unspecified: Secondary | ICD-10-CM | POA: Diagnosis not present

## 2021-12-27 DIAGNOSIS — G4700 Insomnia, unspecified: Secondary | ICD-10-CM | POA: Diagnosis not present

## 2021-12-27 DIAGNOSIS — L299 Pruritus, unspecified: Secondary | ICD-10-CM | POA: Diagnosis not present

## 2021-12-28 DIAGNOSIS — I10 Essential (primary) hypertension: Secondary | ICD-10-CM | POA: Diagnosis not present

## 2021-12-28 DIAGNOSIS — E1122 Type 2 diabetes mellitus with diabetic chronic kidney disease: Secondary | ICD-10-CM | POA: Diagnosis not present

## 2021-12-29 DIAGNOSIS — L299 Pruritus, unspecified: Secondary | ICD-10-CM | POA: Diagnosis not present

## 2021-12-29 DIAGNOSIS — Z992 Dependence on renal dialysis: Secondary | ICD-10-CM | POA: Diagnosis not present

## 2021-12-29 DIAGNOSIS — N186 End stage renal disease: Secondary | ICD-10-CM | POA: Diagnosis not present

## 2021-12-29 DIAGNOSIS — D631 Anemia in chronic kidney disease: Secondary | ICD-10-CM | POA: Diagnosis not present

## 2021-12-29 DIAGNOSIS — R197 Diarrhea, unspecified: Secondary | ICD-10-CM | POA: Diagnosis not present

## 2021-12-29 DIAGNOSIS — E1122 Type 2 diabetes mellitus with diabetic chronic kidney disease: Secondary | ICD-10-CM | POA: Diagnosis not present

## 2021-12-29 DIAGNOSIS — N2581 Secondary hyperparathyroidism of renal origin: Secondary | ICD-10-CM | POA: Diagnosis not present

## 2021-12-31 DIAGNOSIS — R197 Diarrhea, unspecified: Secondary | ICD-10-CM | POA: Diagnosis not present

## 2021-12-31 DIAGNOSIS — Z992 Dependence on renal dialysis: Secondary | ICD-10-CM | POA: Diagnosis not present

## 2021-12-31 DIAGNOSIS — D631 Anemia in chronic kidney disease: Secondary | ICD-10-CM | POA: Diagnosis not present

## 2021-12-31 DIAGNOSIS — N186 End stage renal disease: Secondary | ICD-10-CM | POA: Diagnosis not present

## 2021-12-31 DIAGNOSIS — E1122 Type 2 diabetes mellitus with diabetic chronic kidney disease: Secondary | ICD-10-CM | POA: Diagnosis not present

## 2021-12-31 DIAGNOSIS — L299 Pruritus, unspecified: Secondary | ICD-10-CM | POA: Diagnosis not present

## 2021-12-31 DIAGNOSIS — N2581 Secondary hyperparathyroidism of renal origin: Secondary | ICD-10-CM | POA: Diagnosis not present

## 2022-01-03 DIAGNOSIS — N2581 Secondary hyperparathyroidism of renal origin: Secondary | ICD-10-CM | POA: Diagnosis not present

## 2022-01-03 DIAGNOSIS — N186 End stage renal disease: Secondary | ICD-10-CM | POA: Diagnosis not present

## 2022-01-03 DIAGNOSIS — L299 Pruritus, unspecified: Secondary | ICD-10-CM | POA: Diagnosis not present

## 2022-01-03 DIAGNOSIS — E1122 Type 2 diabetes mellitus with diabetic chronic kidney disease: Secondary | ICD-10-CM | POA: Diagnosis not present

## 2022-01-03 DIAGNOSIS — D631 Anemia in chronic kidney disease: Secondary | ICD-10-CM | POA: Diagnosis not present

## 2022-01-03 DIAGNOSIS — Z992 Dependence on renal dialysis: Secondary | ICD-10-CM | POA: Diagnosis not present

## 2022-01-03 DIAGNOSIS — R197 Diarrhea, unspecified: Secondary | ICD-10-CM | POA: Diagnosis not present

## 2022-01-05 DIAGNOSIS — R197 Diarrhea, unspecified: Secondary | ICD-10-CM | POA: Diagnosis not present

## 2022-01-05 DIAGNOSIS — E1122 Type 2 diabetes mellitus with diabetic chronic kidney disease: Secondary | ICD-10-CM | POA: Diagnosis not present

## 2022-01-05 DIAGNOSIS — N186 End stage renal disease: Secondary | ICD-10-CM | POA: Diagnosis not present

## 2022-01-05 DIAGNOSIS — Z992 Dependence on renal dialysis: Secondary | ICD-10-CM | POA: Diagnosis not present

## 2022-01-05 DIAGNOSIS — L299 Pruritus, unspecified: Secondary | ICD-10-CM | POA: Diagnosis not present

## 2022-01-05 DIAGNOSIS — D631 Anemia in chronic kidney disease: Secondary | ICD-10-CM | POA: Diagnosis not present

## 2022-01-05 DIAGNOSIS — N2581 Secondary hyperparathyroidism of renal origin: Secondary | ICD-10-CM | POA: Diagnosis not present

## 2022-01-07 DIAGNOSIS — N186 End stage renal disease: Secondary | ICD-10-CM | POA: Diagnosis not present

## 2022-01-07 DIAGNOSIS — N2581 Secondary hyperparathyroidism of renal origin: Secondary | ICD-10-CM | POA: Diagnosis not present

## 2022-01-07 DIAGNOSIS — Z992 Dependence on renal dialysis: Secondary | ICD-10-CM | POA: Diagnosis not present

## 2022-01-07 DIAGNOSIS — E1122 Type 2 diabetes mellitus with diabetic chronic kidney disease: Secondary | ICD-10-CM | POA: Diagnosis not present

## 2022-01-07 DIAGNOSIS — L299 Pruritus, unspecified: Secondary | ICD-10-CM | POA: Diagnosis not present

## 2022-01-07 DIAGNOSIS — R197 Diarrhea, unspecified: Secondary | ICD-10-CM | POA: Diagnosis not present

## 2022-01-07 DIAGNOSIS — D631 Anemia in chronic kidney disease: Secondary | ICD-10-CM | POA: Diagnosis not present

## 2022-01-10 DIAGNOSIS — R197 Diarrhea, unspecified: Secondary | ICD-10-CM | POA: Diagnosis not present

## 2022-01-10 DIAGNOSIS — E1122 Type 2 diabetes mellitus with diabetic chronic kidney disease: Secondary | ICD-10-CM | POA: Diagnosis not present

## 2022-01-10 DIAGNOSIS — Z992 Dependence on renal dialysis: Secondary | ICD-10-CM | POA: Diagnosis not present

## 2022-01-10 DIAGNOSIS — D631 Anemia in chronic kidney disease: Secondary | ICD-10-CM | POA: Diagnosis not present

## 2022-01-10 DIAGNOSIS — N186 End stage renal disease: Secondary | ICD-10-CM | POA: Diagnosis not present

## 2022-01-10 DIAGNOSIS — N2581 Secondary hyperparathyroidism of renal origin: Secondary | ICD-10-CM | POA: Diagnosis not present

## 2022-01-12 DIAGNOSIS — R197 Diarrhea, unspecified: Secondary | ICD-10-CM | POA: Diagnosis not present

## 2022-01-12 DIAGNOSIS — M109 Gout, unspecified: Secondary | ICD-10-CM | POA: Diagnosis not present

## 2022-01-12 DIAGNOSIS — D631 Anemia in chronic kidney disease: Secondary | ICD-10-CM | POA: Diagnosis not present

## 2022-01-12 DIAGNOSIS — G4700 Insomnia, unspecified: Secondary | ICD-10-CM | POA: Diagnosis not present

## 2022-01-12 DIAGNOSIS — Z992 Dependence on renal dialysis: Secondary | ICD-10-CM | POA: Diagnosis not present

## 2022-01-12 DIAGNOSIS — E1122 Type 2 diabetes mellitus with diabetic chronic kidney disease: Secondary | ICD-10-CM | POA: Diagnosis not present

## 2022-01-12 DIAGNOSIS — N2581 Secondary hyperparathyroidism of renal origin: Secondary | ICD-10-CM | POA: Diagnosis not present

## 2022-01-12 DIAGNOSIS — N186 End stage renal disease: Secondary | ICD-10-CM | POA: Diagnosis not present

## 2022-01-15 DIAGNOSIS — N2581 Secondary hyperparathyroidism of renal origin: Secondary | ICD-10-CM | POA: Diagnosis not present

## 2022-01-15 DIAGNOSIS — D631 Anemia in chronic kidney disease: Secondary | ICD-10-CM | POA: Diagnosis not present

## 2022-01-15 DIAGNOSIS — R197 Diarrhea, unspecified: Secondary | ICD-10-CM | POA: Diagnosis not present

## 2022-01-15 DIAGNOSIS — E1122 Type 2 diabetes mellitus with diabetic chronic kidney disease: Secondary | ICD-10-CM | POA: Diagnosis not present

## 2022-01-15 DIAGNOSIS — Z992 Dependence on renal dialysis: Secondary | ICD-10-CM | POA: Diagnosis not present

## 2022-01-15 DIAGNOSIS — N186 End stage renal disease: Secondary | ICD-10-CM | POA: Diagnosis not present

## 2022-01-15 DIAGNOSIS — M109 Gout, unspecified: Secondary | ICD-10-CM | POA: Diagnosis not present

## 2022-01-17 DIAGNOSIS — E1122 Type 2 diabetes mellitus with diabetic chronic kidney disease: Secondary | ICD-10-CM | POA: Diagnosis not present

## 2022-01-17 DIAGNOSIS — N2581 Secondary hyperparathyroidism of renal origin: Secondary | ICD-10-CM | POA: Diagnosis not present

## 2022-01-17 DIAGNOSIS — R197 Diarrhea, unspecified: Secondary | ICD-10-CM | POA: Diagnosis not present

## 2022-01-17 DIAGNOSIS — N186 End stage renal disease: Secondary | ICD-10-CM | POA: Diagnosis not present

## 2022-01-17 DIAGNOSIS — Z992 Dependence on renal dialysis: Secondary | ICD-10-CM | POA: Diagnosis not present

## 2022-01-17 DIAGNOSIS — D631 Anemia in chronic kidney disease: Secondary | ICD-10-CM | POA: Diagnosis not present

## 2022-01-19 DIAGNOSIS — Z992 Dependence on renal dialysis: Secondary | ICD-10-CM | POA: Diagnosis not present

## 2022-01-19 DIAGNOSIS — E1122 Type 2 diabetes mellitus with diabetic chronic kidney disease: Secondary | ICD-10-CM | POA: Diagnosis not present

## 2022-01-19 DIAGNOSIS — R197 Diarrhea, unspecified: Secondary | ICD-10-CM | POA: Diagnosis not present

## 2022-01-19 DIAGNOSIS — N2581 Secondary hyperparathyroidism of renal origin: Secondary | ICD-10-CM | POA: Diagnosis not present

## 2022-01-19 DIAGNOSIS — N186 End stage renal disease: Secondary | ICD-10-CM | POA: Diagnosis not present

## 2022-01-19 DIAGNOSIS — M109 Gout, unspecified: Secondary | ICD-10-CM | POA: Diagnosis not present

## 2022-01-19 DIAGNOSIS — D631 Anemia in chronic kidney disease: Secondary | ICD-10-CM | POA: Diagnosis not present

## 2022-01-22 DIAGNOSIS — E1122 Type 2 diabetes mellitus with diabetic chronic kidney disease: Secondary | ICD-10-CM | POA: Diagnosis not present

## 2022-01-22 DIAGNOSIS — D631 Anemia in chronic kidney disease: Secondary | ICD-10-CM | POA: Diagnosis not present

## 2022-01-22 DIAGNOSIS — Z992 Dependence on renal dialysis: Secondary | ICD-10-CM | POA: Diagnosis not present

## 2022-01-22 DIAGNOSIS — N186 End stage renal disease: Secondary | ICD-10-CM | POA: Diagnosis not present

## 2022-01-22 DIAGNOSIS — N2581 Secondary hyperparathyroidism of renal origin: Secondary | ICD-10-CM | POA: Diagnosis not present

## 2022-01-22 DIAGNOSIS — R197 Diarrhea, unspecified: Secondary | ICD-10-CM | POA: Diagnosis not present

## 2022-01-24 DIAGNOSIS — D631 Anemia in chronic kidney disease: Secondary | ICD-10-CM | POA: Diagnosis not present

## 2022-01-24 DIAGNOSIS — E1122 Type 2 diabetes mellitus with diabetic chronic kidney disease: Secondary | ICD-10-CM | POA: Diagnosis not present

## 2022-01-24 DIAGNOSIS — N2581 Secondary hyperparathyroidism of renal origin: Secondary | ICD-10-CM | POA: Diagnosis not present

## 2022-01-24 DIAGNOSIS — Z992 Dependence on renal dialysis: Secondary | ICD-10-CM | POA: Diagnosis not present

## 2022-01-24 DIAGNOSIS — N186 End stage renal disease: Secondary | ICD-10-CM | POA: Diagnosis not present

## 2022-01-24 DIAGNOSIS — R197 Diarrhea, unspecified: Secondary | ICD-10-CM | POA: Diagnosis not present

## 2022-01-26 DIAGNOSIS — Z992 Dependence on renal dialysis: Secondary | ICD-10-CM | POA: Diagnosis not present

## 2022-01-26 DIAGNOSIS — R197 Diarrhea, unspecified: Secondary | ICD-10-CM | POA: Diagnosis not present

## 2022-01-26 DIAGNOSIS — N2581 Secondary hyperparathyroidism of renal origin: Secondary | ICD-10-CM | POA: Diagnosis not present

## 2022-01-26 DIAGNOSIS — N186 End stage renal disease: Secondary | ICD-10-CM | POA: Diagnosis not present

## 2022-01-26 DIAGNOSIS — E1122 Type 2 diabetes mellitus with diabetic chronic kidney disease: Secondary | ICD-10-CM | POA: Diagnosis not present

## 2022-01-26 DIAGNOSIS — D631 Anemia in chronic kidney disease: Secondary | ICD-10-CM | POA: Diagnosis not present

## 2022-01-29 DIAGNOSIS — E1122 Type 2 diabetes mellitus with diabetic chronic kidney disease: Secondary | ICD-10-CM | POA: Diagnosis not present

## 2022-01-29 DIAGNOSIS — N186 End stage renal disease: Secondary | ICD-10-CM | POA: Diagnosis not present

## 2022-01-29 DIAGNOSIS — Z992 Dependence on renal dialysis: Secondary | ICD-10-CM | POA: Diagnosis not present

## 2022-01-29 DIAGNOSIS — N2581 Secondary hyperparathyroidism of renal origin: Secondary | ICD-10-CM | POA: Diagnosis not present

## 2022-01-29 DIAGNOSIS — R197 Diarrhea, unspecified: Secondary | ICD-10-CM | POA: Diagnosis not present

## 2022-01-29 DIAGNOSIS — D631 Anemia in chronic kidney disease: Secondary | ICD-10-CM | POA: Diagnosis not present

## 2022-01-31 DIAGNOSIS — N2581 Secondary hyperparathyroidism of renal origin: Secondary | ICD-10-CM | POA: Diagnosis not present

## 2022-01-31 DIAGNOSIS — D631 Anemia in chronic kidney disease: Secondary | ICD-10-CM | POA: Diagnosis not present

## 2022-01-31 DIAGNOSIS — Z992 Dependence on renal dialysis: Secondary | ICD-10-CM | POA: Diagnosis not present

## 2022-01-31 DIAGNOSIS — N186 End stage renal disease: Secondary | ICD-10-CM | POA: Diagnosis not present

## 2022-01-31 DIAGNOSIS — E1122 Type 2 diabetes mellitus with diabetic chronic kidney disease: Secondary | ICD-10-CM | POA: Diagnosis not present

## 2022-01-31 DIAGNOSIS — I10 Essential (primary) hypertension: Secondary | ICD-10-CM | POA: Diagnosis not present

## 2022-01-31 DIAGNOSIS — R197 Diarrhea, unspecified: Secondary | ICD-10-CM | POA: Diagnosis not present

## 2022-02-01 DIAGNOSIS — Z7409 Other reduced mobility: Secondary | ICD-10-CM | POA: Diagnosis not present

## 2022-02-01 DIAGNOSIS — G629 Polyneuropathy, unspecified: Secondary | ICD-10-CM | POA: Diagnosis not present

## 2022-02-01 DIAGNOSIS — I10 Essential (primary) hypertension: Secondary | ICD-10-CM | POA: Diagnosis not present

## 2022-02-01 DIAGNOSIS — N189 Chronic kidney disease, unspecified: Secondary | ICD-10-CM | POA: Diagnosis not present

## 2022-02-01 DIAGNOSIS — E1122 Type 2 diabetes mellitus with diabetic chronic kidney disease: Secondary | ICD-10-CM | POA: Diagnosis not present

## 2022-02-02 DIAGNOSIS — N186 End stage renal disease: Secondary | ICD-10-CM | POA: Diagnosis not present

## 2022-02-02 DIAGNOSIS — D631 Anemia in chronic kidney disease: Secondary | ICD-10-CM | POA: Diagnosis not present

## 2022-02-02 DIAGNOSIS — R197 Diarrhea, unspecified: Secondary | ICD-10-CM | POA: Diagnosis not present

## 2022-02-02 DIAGNOSIS — Z992 Dependence on renal dialysis: Secondary | ICD-10-CM | POA: Diagnosis not present

## 2022-02-02 DIAGNOSIS — N2581 Secondary hyperparathyroidism of renal origin: Secondary | ICD-10-CM | POA: Diagnosis not present

## 2022-02-02 DIAGNOSIS — E1122 Type 2 diabetes mellitus with diabetic chronic kidney disease: Secondary | ICD-10-CM | POA: Diagnosis not present

## 2022-02-05 DIAGNOSIS — D631 Anemia in chronic kidney disease: Secondary | ICD-10-CM | POA: Diagnosis not present

## 2022-02-05 DIAGNOSIS — R197 Diarrhea, unspecified: Secondary | ICD-10-CM | POA: Diagnosis not present

## 2022-02-05 DIAGNOSIS — N2581 Secondary hyperparathyroidism of renal origin: Secondary | ICD-10-CM | POA: Diagnosis not present

## 2022-02-05 DIAGNOSIS — N186 End stage renal disease: Secondary | ICD-10-CM | POA: Diagnosis not present

## 2022-02-05 DIAGNOSIS — Z992 Dependence on renal dialysis: Secondary | ICD-10-CM | POA: Diagnosis not present

## 2022-02-05 DIAGNOSIS — E1122 Type 2 diabetes mellitus with diabetic chronic kidney disease: Secondary | ICD-10-CM | POA: Diagnosis not present

## 2022-02-06 ENCOUNTER — Encounter (INDEPENDENT_AMBULATORY_CARE_PROVIDER_SITE_OTHER): Payer: 59 | Admitting: Ophthalmology

## 2022-02-06 DIAGNOSIS — I10 Essential (primary) hypertension: Secondary | ICD-10-CM

## 2022-02-06 DIAGNOSIS — E113513 Type 2 diabetes mellitus with proliferative diabetic retinopathy with macular edema, bilateral: Secondary | ICD-10-CM

## 2022-02-06 DIAGNOSIS — H35033 Hypertensive retinopathy, bilateral: Secondary | ICD-10-CM

## 2022-02-06 DIAGNOSIS — H43813 Vitreous degeneration, bilateral: Secondary | ICD-10-CM | POA: Diagnosis not present

## 2022-02-07 DIAGNOSIS — R197 Diarrhea, unspecified: Secondary | ICD-10-CM | POA: Diagnosis not present

## 2022-02-07 DIAGNOSIS — N2581 Secondary hyperparathyroidism of renal origin: Secondary | ICD-10-CM | POA: Diagnosis not present

## 2022-02-07 DIAGNOSIS — M109 Gout, unspecified: Secondary | ICD-10-CM | POA: Diagnosis not present

## 2022-02-07 DIAGNOSIS — N186 End stage renal disease: Secondary | ICD-10-CM | POA: Diagnosis not present

## 2022-02-07 DIAGNOSIS — E1122 Type 2 diabetes mellitus with diabetic chronic kidney disease: Secondary | ICD-10-CM | POA: Diagnosis not present

## 2022-02-07 DIAGNOSIS — Z992 Dependence on renal dialysis: Secondary | ICD-10-CM | POA: Diagnosis not present

## 2022-02-07 DIAGNOSIS — R600 Localized edema: Secondary | ICD-10-CM | POA: Diagnosis not present

## 2022-02-07 DIAGNOSIS — D631 Anemia in chronic kidney disease: Secondary | ICD-10-CM | POA: Diagnosis not present

## 2022-02-09 DIAGNOSIS — R197 Diarrhea, unspecified: Secondary | ICD-10-CM | POA: Diagnosis not present

## 2022-02-09 DIAGNOSIS — N186 End stage renal disease: Secondary | ICD-10-CM | POA: Diagnosis not present

## 2022-02-09 DIAGNOSIS — Z992 Dependence on renal dialysis: Secondary | ICD-10-CM | POA: Diagnosis not present

## 2022-02-09 DIAGNOSIS — N2581 Secondary hyperparathyroidism of renal origin: Secondary | ICD-10-CM | POA: Diagnosis not present

## 2022-02-09 DIAGNOSIS — E1122 Type 2 diabetes mellitus with diabetic chronic kidney disease: Secondary | ICD-10-CM | POA: Diagnosis not present

## 2022-02-12 DIAGNOSIS — N2581 Secondary hyperparathyroidism of renal origin: Secondary | ICD-10-CM | POA: Diagnosis not present

## 2022-02-12 DIAGNOSIS — R197 Diarrhea, unspecified: Secondary | ICD-10-CM | POA: Diagnosis not present

## 2022-02-12 DIAGNOSIS — M79602 Pain in left arm: Secondary | ICD-10-CM | POA: Diagnosis not present

## 2022-02-12 DIAGNOSIS — N186 End stage renal disease: Secondary | ICD-10-CM | POA: Diagnosis not present

## 2022-02-12 DIAGNOSIS — E1122 Type 2 diabetes mellitus with diabetic chronic kidney disease: Secondary | ICD-10-CM | POA: Diagnosis not present

## 2022-02-12 DIAGNOSIS — Z992 Dependence on renal dialysis: Secondary | ICD-10-CM | POA: Diagnosis not present

## 2022-02-13 DIAGNOSIS — G629 Polyneuropathy, unspecified: Secondary | ICD-10-CM | POA: Diagnosis not present

## 2022-02-13 DIAGNOSIS — R600 Localized edema: Secondary | ICD-10-CM | POA: Diagnosis not present

## 2022-02-14 DIAGNOSIS — E1122 Type 2 diabetes mellitus with diabetic chronic kidney disease: Secondary | ICD-10-CM | POA: Diagnosis not present

## 2022-02-14 DIAGNOSIS — N186 End stage renal disease: Secondary | ICD-10-CM | POA: Diagnosis not present

## 2022-02-14 DIAGNOSIS — Z992 Dependence on renal dialysis: Secondary | ICD-10-CM | POA: Diagnosis not present

## 2022-02-14 DIAGNOSIS — R197 Diarrhea, unspecified: Secondary | ICD-10-CM | POA: Diagnosis not present

## 2022-02-14 DIAGNOSIS — N2581 Secondary hyperparathyroidism of renal origin: Secondary | ICD-10-CM | POA: Diagnosis not present

## 2022-02-16 DIAGNOSIS — Z992 Dependence on renal dialysis: Secondary | ICD-10-CM | POA: Diagnosis not present

## 2022-02-16 DIAGNOSIS — E1122 Type 2 diabetes mellitus with diabetic chronic kidney disease: Secondary | ICD-10-CM | POA: Diagnosis not present

## 2022-02-16 DIAGNOSIS — N186 End stage renal disease: Secondary | ICD-10-CM | POA: Diagnosis not present

## 2022-02-16 DIAGNOSIS — R197 Diarrhea, unspecified: Secondary | ICD-10-CM | POA: Diagnosis not present

## 2022-02-16 DIAGNOSIS — N2581 Secondary hyperparathyroidism of renal origin: Secondary | ICD-10-CM | POA: Diagnosis not present

## 2022-02-19 DIAGNOSIS — N2581 Secondary hyperparathyroidism of renal origin: Secondary | ICD-10-CM | POA: Diagnosis not present

## 2022-02-19 DIAGNOSIS — N186 End stage renal disease: Secondary | ICD-10-CM | POA: Diagnosis not present

## 2022-02-19 DIAGNOSIS — R279 Unspecified lack of coordination: Secondary | ICD-10-CM | POA: Diagnosis not present

## 2022-02-19 DIAGNOSIS — Z992 Dependence on renal dialysis: Secondary | ICD-10-CM | POA: Diagnosis not present

## 2022-02-19 DIAGNOSIS — E1122 Type 2 diabetes mellitus with diabetic chronic kidney disease: Secondary | ICD-10-CM | POA: Diagnosis not present

## 2022-02-19 DIAGNOSIS — R197 Diarrhea, unspecified: Secondary | ICD-10-CM | POA: Diagnosis not present

## 2022-02-19 DIAGNOSIS — M6281 Muscle weakness (generalized): Secondary | ICD-10-CM | POA: Diagnosis not present

## 2022-02-20 DIAGNOSIS — R279 Unspecified lack of coordination: Secondary | ICD-10-CM | POA: Diagnosis not present

## 2022-02-20 DIAGNOSIS — M6281 Muscle weakness (generalized): Secondary | ICD-10-CM | POA: Diagnosis not present

## 2022-02-20 DIAGNOSIS — N186 End stage renal disease: Secondary | ICD-10-CM | POA: Diagnosis not present

## 2022-02-21 DIAGNOSIS — Z992 Dependence on renal dialysis: Secondary | ICD-10-CM | POA: Diagnosis not present

## 2022-02-21 DIAGNOSIS — N186 End stage renal disease: Secondary | ICD-10-CM | POA: Diagnosis not present

## 2022-02-21 DIAGNOSIS — N2581 Secondary hyperparathyroidism of renal origin: Secondary | ICD-10-CM | POA: Diagnosis not present

## 2022-02-21 DIAGNOSIS — E1122 Type 2 diabetes mellitus with diabetic chronic kidney disease: Secondary | ICD-10-CM | POA: Diagnosis not present

## 2022-02-21 DIAGNOSIS — R197 Diarrhea, unspecified: Secondary | ICD-10-CM | POA: Diagnosis not present

## 2022-02-22 DIAGNOSIS — M6281 Muscle weakness (generalized): Secondary | ICD-10-CM | POA: Diagnosis not present

## 2022-02-22 DIAGNOSIS — R279 Unspecified lack of coordination: Secondary | ICD-10-CM | POA: Diagnosis not present

## 2022-02-22 DIAGNOSIS — N186 End stage renal disease: Secondary | ICD-10-CM | POA: Diagnosis not present

## 2022-02-23 DIAGNOSIS — N186 End stage renal disease: Secondary | ICD-10-CM | POA: Diagnosis not present

## 2022-02-23 DIAGNOSIS — R279 Unspecified lack of coordination: Secondary | ICD-10-CM | POA: Diagnosis not present

## 2022-02-23 DIAGNOSIS — Z992 Dependence on renal dialysis: Secondary | ICD-10-CM | POA: Diagnosis not present

## 2022-02-23 DIAGNOSIS — M6281 Muscle weakness (generalized): Secondary | ICD-10-CM | POA: Diagnosis not present

## 2022-02-23 DIAGNOSIS — N2581 Secondary hyperparathyroidism of renal origin: Secondary | ICD-10-CM | POA: Diagnosis not present

## 2022-02-23 DIAGNOSIS — R197 Diarrhea, unspecified: Secondary | ICD-10-CM | POA: Diagnosis not present

## 2022-02-23 DIAGNOSIS — E1122 Type 2 diabetes mellitus with diabetic chronic kidney disease: Secondary | ICD-10-CM | POA: Diagnosis not present

## 2022-02-24 DIAGNOSIS — M6281 Muscle weakness (generalized): Secondary | ICD-10-CM | POA: Diagnosis not present

## 2022-02-24 DIAGNOSIS — R279 Unspecified lack of coordination: Secondary | ICD-10-CM | POA: Diagnosis not present

## 2022-02-24 DIAGNOSIS — N186 End stage renal disease: Secondary | ICD-10-CM | POA: Diagnosis not present

## 2022-02-26 DIAGNOSIS — M6281 Muscle weakness (generalized): Secondary | ICD-10-CM | POA: Diagnosis not present

## 2022-02-26 DIAGNOSIS — N186 End stage renal disease: Secondary | ICD-10-CM | POA: Diagnosis not present

## 2022-02-26 DIAGNOSIS — E1122 Type 2 diabetes mellitus with diabetic chronic kidney disease: Secondary | ICD-10-CM | POA: Diagnosis not present

## 2022-02-26 DIAGNOSIS — Z992 Dependence on renal dialysis: Secondary | ICD-10-CM | POA: Diagnosis not present

## 2022-02-26 DIAGNOSIS — N2581 Secondary hyperparathyroidism of renal origin: Secondary | ICD-10-CM | POA: Diagnosis not present

## 2022-02-26 DIAGNOSIS — R197 Diarrhea, unspecified: Secondary | ICD-10-CM | POA: Diagnosis not present

## 2022-02-26 DIAGNOSIS — R279 Unspecified lack of coordination: Secondary | ICD-10-CM | POA: Diagnosis not present

## 2022-02-27 DIAGNOSIS — I10 Essential (primary) hypertension: Secondary | ICD-10-CM | POA: Diagnosis not present

## 2022-02-27 DIAGNOSIS — R600 Localized edema: Secondary | ICD-10-CM | POA: Diagnosis not present

## 2022-02-28 DIAGNOSIS — N186 End stage renal disease: Secondary | ICD-10-CM | POA: Diagnosis not present

## 2022-02-28 DIAGNOSIS — Z992 Dependence on renal dialysis: Secondary | ICD-10-CM | POA: Diagnosis not present

## 2022-02-28 DIAGNOSIS — R197 Diarrhea, unspecified: Secondary | ICD-10-CM | POA: Diagnosis not present

## 2022-02-28 DIAGNOSIS — M6281 Muscle weakness (generalized): Secondary | ICD-10-CM | POA: Diagnosis not present

## 2022-02-28 DIAGNOSIS — N2581 Secondary hyperparathyroidism of renal origin: Secondary | ICD-10-CM | POA: Diagnosis not present

## 2022-02-28 DIAGNOSIS — R279 Unspecified lack of coordination: Secondary | ICD-10-CM | POA: Diagnosis not present

## 2022-02-28 DIAGNOSIS — E1122 Type 2 diabetes mellitus with diabetic chronic kidney disease: Secondary | ICD-10-CM | POA: Diagnosis not present

## 2022-03-01 DIAGNOSIS — M6281 Muscle weakness (generalized): Secondary | ICD-10-CM | POA: Diagnosis not present

## 2022-03-01 DIAGNOSIS — N186 End stage renal disease: Secondary | ICD-10-CM | POA: Diagnosis not present

## 2022-03-01 DIAGNOSIS — R600 Localized edema: Secondary | ICD-10-CM | POA: Diagnosis not present

## 2022-03-01 DIAGNOSIS — R279 Unspecified lack of coordination: Secondary | ICD-10-CM | POA: Diagnosis not present

## 2022-03-02 DIAGNOSIS — E1122 Type 2 diabetes mellitus with diabetic chronic kidney disease: Secondary | ICD-10-CM | POA: Diagnosis not present

## 2022-03-02 DIAGNOSIS — M6281 Muscle weakness (generalized): Secondary | ICD-10-CM | POA: Diagnosis not present

## 2022-03-02 DIAGNOSIS — R279 Unspecified lack of coordination: Secondary | ICD-10-CM | POA: Diagnosis not present

## 2022-03-02 DIAGNOSIS — R197 Diarrhea, unspecified: Secondary | ICD-10-CM | POA: Diagnosis not present

## 2022-03-02 DIAGNOSIS — N186 End stage renal disease: Secondary | ICD-10-CM | POA: Diagnosis not present

## 2022-03-02 DIAGNOSIS — N2581 Secondary hyperparathyroidism of renal origin: Secondary | ICD-10-CM | POA: Diagnosis not present

## 2022-03-02 DIAGNOSIS — Z992 Dependence on renal dialysis: Secondary | ICD-10-CM | POA: Diagnosis not present

## 2022-03-03 DIAGNOSIS — M6281 Muscle weakness (generalized): Secondary | ICD-10-CM | POA: Diagnosis not present

## 2022-03-03 DIAGNOSIS — N186 End stage renal disease: Secondary | ICD-10-CM | POA: Diagnosis not present

## 2022-03-03 DIAGNOSIS — R279 Unspecified lack of coordination: Secondary | ICD-10-CM | POA: Diagnosis not present

## 2022-03-05 DIAGNOSIS — Z992 Dependence on renal dialysis: Secondary | ICD-10-CM | POA: Diagnosis not present

## 2022-03-05 DIAGNOSIS — E1122 Type 2 diabetes mellitus with diabetic chronic kidney disease: Secondary | ICD-10-CM | POA: Diagnosis not present

## 2022-03-05 DIAGNOSIS — R279 Unspecified lack of coordination: Secondary | ICD-10-CM | POA: Diagnosis not present

## 2022-03-05 DIAGNOSIS — N2581 Secondary hyperparathyroidism of renal origin: Secondary | ICD-10-CM | POA: Diagnosis not present

## 2022-03-05 DIAGNOSIS — R197 Diarrhea, unspecified: Secondary | ICD-10-CM | POA: Diagnosis not present

## 2022-03-05 DIAGNOSIS — M6281 Muscle weakness (generalized): Secondary | ICD-10-CM | POA: Diagnosis not present

## 2022-03-05 DIAGNOSIS — N186 End stage renal disease: Secondary | ICD-10-CM | POA: Diagnosis not present

## 2022-03-06 ENCOUNTER — Encounter (INDEPENDENT_AMBULATORY_CARE_PROVIDER_SITE_OTHER): Payer: 59 | Admitting: Ophthalmology

## 2022-03-06 DIAGNOSIS — E1122 Type 2 diabetes mellitus with diabetic chronic kidney disease: Secondary | ICD-10-CM | POA: Diagnosis not present

## 2022-03-06 DIAGNOSIS — J329 Chronic sinusitis, unspecified: Secondary | ICD-10-CM | POA: Diagnosis not present

## 2022-03-06 DIAGNOSIS — I10 Essential (primary) hypertension: Secondary | ICD-10-CM | POA: Diagnosis not present

## 2022-03-07 DIAGNOSIS — R197 Diarrhea, unspecified: Secondary | ICD-10-CM | POA: Diagnosis not present

## 2022-03-07 DIAGNOSIS — N186 End stage renal disease: Secondary | ICD-10-CM | POA: Diagnosis not present

## 2022-03-07 DIAGNOSIS — N2581 Secondary hyperparathyroidism of renal origin: Secondary | ICD-10-CM | POA: Diagnosis not present

## 2022-03-07 DIAGNOSIS — Z992 Dependence on renal dialysis: Secondary | ICD-10-CM | POA: Diagnosis not present

## 2022-03-07 DIAGNOSIS — E1122 Type 2 diabetes mellitus with diabetic chronic kidney disease: Secondary | ICD-10-CM | POA: Diagnosis not present

## 2022-03-08 ENCOUNTER — Encounter: Payer: Self-pay | Admitting: Orthopaedic Surgery

## 2022-03-08 ENCOUNTER — Ambulatory Visit (INDEPENDENT_AMBULATORY_CARE_PROVIDER_SITE_OTHER): Payer: 59 | Admitting: Orthopaedic Surgery

## 2022-03-08 DIAGNOSIS — M7989 Other specified soft tissue disorders: Secondary | ICD-10-CM | POA: Diagnosis not present

## 2022-03-08 DIAGNOSIS — E1122 Type 2 diabetes mellitus with diabetic chronic kidney disease: Secondary | ICD-10-CM | POA: Diagnosis not present

## 2022-03-08 DIAGNOSIS — M6281 Muscle weakness (generalized): Secondary | ICD-10-CM | POA: Diagnosis not present

## 2022-03-08 DIAGNOSIS — N186 End stage renal disease: Secondary | ICD-10-CM | POA: Diagnosis not present

## 2022-03-08 DIAGNOSIS — R279 Unspecified lack of coordination: Secondary | ICD-10-CM | POA: Diagnosis not present

## 2022-03-08 DIAGNOSIS — Z992 Dependence on renal dialysis: Secondary | ICD-10-CM | POA: Diagnosis not present
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
## 2022-03-09 DIAGNOSIS — N2581 Secondary hyperparathyroidism of renal origin: Secondary | ICD-10-CM | POA: Diagnosis not present

## 2022-03-09 DIAGNOSIS — M6281 Muscle weakness (generalized): Secondary | ICD-10-CM | POA: Diagnosis not present

## 2022-03-09 DIAGNOSIS — I159 Secondary hypertension, unspecified: Secondary | ICD-10-CM | POA: Diagnosis not present

## 2022-03-09 DIAGNOSIS — E1122 Type 2 diabetes mellitus with diabetic chronic kidney disease: Secondary | ICD-10-CM | POA: Diagnosis not present

## 2022-03-09 DIAGNOSIS — R197 Diarrhea, unspecified: Secondary | ICD-10-CM | POA: Diagnosis not present

## 2022-03-09 DIAGNOSIS — Z992 Dependence on renal dialysis: Secondary | ICD-10-CM | POA: Diagnosis not present

## 2022-03-09 DIAGNOSIS — D631 Anemia in chronic kidney disease: Secondary | ICD-10-CM | POA: Diagnosis not present

## 2022-03-09 DIAGNOSIS — R279 Unspecified lack of coordination: Secondary | ICD-10-CM | POA: Diagnosis not present

## 2022-03-09 DIAGNOSIS — N186 End stage renal disease: Secondary | ICD-10-CM | POA: Diagnosis not present

## 2022-03-12 DIAGNOSIS — D631 Anemia in chronic kidney disease: Secondary | ICD-10-CM | POA: Diagnosis not present

## 2022-03-12 DIAGNOSIS — R279 Unspecified lack of coordination: Secondary | ICD-10-CM | POA: Diagnosis not present

## 2022-03-12 DIAGNOSIS — N2581 Secondary hyperparathyroidism of renal origin: Secondary | ICD-10-CM | POA: Diagnosis not present

## 2022-03-12 DIAGNOSIS — E1122 Type 2 diabetes mellitus with diabetic chronic kidney disease: Secondary | ICD-10-CM | POA: Diagnosis not present

## 2022-03-12 DIAGNOSIS — N186 End stage renal disease: Secondary | ICD-10-CM | POA: Diagnosis not present

## 2022-03-12 DIAGNOSIS — M6281 Muscle weakness (generalized): Secondary | ICD-10-CM | POA: Diagnosis not present

## 2022-03-12 DIAGNOSIS — R197 Diarrhea, unspecified: Secondary | ICD-10-CM | POA: Diagnosis not present

## 2022-03-12 DIAGNOSIS — Z992 Dependence on renal dialysis: Secondary | ICD-10-CM | POA: Diagnosis not present

## 2022-03-12 DIAGNOSIS — I159 Secondary hypertension, unspecified: Secondary | ICD-10-CM | POA: Diagnosis not present

## 2022-03-13 ENCOUNTER — Other Ambulatory Visit: Payer: Self-pay | Admitting: *Deleted

## 2022-03-13 ENCOUNTER — Ambulatory Visit (HOSPITAL_COMMUNITY)
Admission: RE | Admit: 2022-03-13 | Discharge: 2022-03-13 | Disposition: A | Discharge status: Home / Self Care | Payer: 59 | Source: Ambulatory Visit | Attending: Vascular Surgery | Admitting: Vascular Surgery

## 2022-03-13 ENCOUNTER — Encounter (INDEPENDENT_AMBULATORY_CARE_PROVIDER_SITE_OTHER): Payer: 59 | Admitting: Ophthalmology

## 2022-03-13 DIAGNOSIS — M7989 Other specified soft tissue disorders: Secondary | ICD-10-CM

## 2022-03-13 DIAGNOSIS — H43813 Vitreous degeneration, bilateral: Secondary | ICD-10-CM

## 2022-03-13 DIAGNOSIS — E103513 Type 1 diabetes mellitus with proliferative diabetic retinopathy with macular edema, bilateral: Secondary | ICD-10-CM | POA: Diagnosis not present

## 2022-03-13 DIAGNOSIS — H35033 Hypertensive retinopathy, bilateral: Secondary | ICD-10-CM

## 2022-03-13 DIAGNOSIS — I10 Essential (primary) hypertension: Secondary | ICD-10-CM | POA: Diagnosis not present

## 2022-03-14 DIAGNOSIS — I159 Secondary hypertension, unspecified: Secondary | ICD-10-CM | POA: Diagnosis not present

## 2022-03-14 DIAGNOSIS — R197 Diarrhea, unspecified: Secondary | ICD-10-CM | POA: Diagnosis not present

## 2022-03-14 DIAGNOSIS — R279 Unspecified lack of coordination: Secondary | ICD-10-CM | POA: Diagnosis not present

## 2022-03-14 DIAGNOSIS — E1122 Type 2 diabetes mellitus with diabetic chronic kidney disease: Secondary | ICD-10-CM | POA: Diagnosis not present

## 2022-03-14 DIAGNOSIS — Z992 Dependence on renal dialysis: Secondary | ICD-10-CM | POA: Diagnosis not present

## 2022-03-14 DIAGNOSIS — D631 Anemia in chronic kidney disease: Secondary | ICD-10-CM | POA: Diagnosis not present

## 2022-03-14 DIAGNOSIS — N186 End stage renal disease: Secondary | ICD-10-CM | POA: Diagnosis not present

## 2022-03-14 DIAGNOSIS — N2581 Secondary hyperparathyroidism of renal origin: Secondary | ICD-10-CM | POA: Diagnosis not present

## 2022-03-14 DIAGNOSIS — M6281 Muscle weakness (generalized): Secondary | ICD-10-CM | POA: Diagnosis not present
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
## 2022-03-15 ENCOUNTER — Ambulatory Visit (INDEPENDENT_AMBULATORY_CARE_PROVIDER_SITE_OTHER): Payer: 59 | Admitting: Physician Assistant

## 2022-03-15 VITALS — BP 161/84 | HR 64 | Temp 97.8°F | Resp 20 | Ht 71.0 in

## 2022-03-15 DIAGNOSIS — I10 Essential (primary) hypertension: Secondary | ICD-10-CM | POA: Diagnosis not present

## 2022-03-15 DIAGNOSIS — M7989 Other specified soft tissue disorders: Secondary | ICD-10-CM | POA: Diagnosis not present

## 2022-03-15 DIAGNOSIS — R600 Localized edema: Secondary | ICD-10-CM | POA: Diagnosis not present

## 2022-03-15 DIAGNOSIS — N186 End stage renal disease: Secondary | ICD-10-CM | POA: Diagnosis not present

## 2022-03-15 DIAGNOSIS — Z89519 Acquired absence of unspecified leg below knee: Secondary | ICD-10-CM | POA: Diagnosis not present

## 2022-03-16 DIAGNOSIS — R197 Diarrhea, unspecified: Secondary | ICD-10-CM | POA: Diagnosis not present

## 2022-03-16 DIAGNOSIS — E1122 Type 2 diabetes mellitus with diabetic chronic kidney disease: Secondary | ICD-10-CM | POA: Diagnosis not present

## 2022-03-16 DIAGNOSIS — I159 Secondary hypertension, unspecified: Secondary | ICD-10-CM | POA: Diagnosis not present

## 2022-03-16 DIAGNOSIS — Z992 Dependence on renal dialysis: Secondary | ICD-10-CM | POA: Diagnosis not present

## 2022-03-16 DIAGNOSIS — N2581 Secondary hyperparathyroidism of renal origin: Secondary | ICD-10-CM | POA: Diagnosis not present

## 2022-03-16 DIAGNOSIS — D631 Anemia in chronic kidney disease: Secondary | ICD-10-CM | POA: Diagnosis not present

## 2022-03-16 DIAGNOSIS — N186 End stage renal disease: Secondary | ICD-10-CM | POA: Diagnosis not present

## 2022-03-19 DIAGNOSIS — I159 Secondary hypertension, unspecified: Secondary | ICD-10-CM | POA: Diagnosis not present

## 2022-03-19 DIAGNOSIS — D631 Anemia in chronic kidney disease: Secondary | ICD-10-CM | POA: Diagnosis not present

## 2022-03-19 DIAGNOSIS — N186 End stage renal disease: Secondary | ICD-10-CM | POA: Diagnosis not present

## 2022-03-19 DIAGNOSIS — Z992 Dependence on renal dialysis: Secondary | ICD-10-CM | POA: Diagnosis not present

## 2022-03-19 DIAGNOSIS — E1122 Type 2 diabetes mellitus with diabetic chronic kidney disease: Secondary | ICD-10-CM | POA: Diagnosis not present

## 2022-03-19 DIAGNOSIS — R197 Diarrhea, unspecified: Secondary | ICD-10-CM | POA: Diagnosis not present

## 2022-03-19 DIAGNOSIS — N2581 Secondary hyperparathyroidism of renal origin: Secondary | ICD-10-CM | POA: Diagnosis not present

## 2022-03-20 DIAGNOSIS — R600 Localized edema: Secondary | ICD-10-CM | POA: Diagnosis not present

## 2022-03-21 DIAGNOSIS — D631 Anemia in chronic kidney disease: Secondary | ICD-10-CM | POA: Diagnosis not present

## 2022-03-21 DIAGNOSIS — I159 Secondary hypertension, unspecified: Secondary | ICD-10-CM | POA: Diagnosis not present

## 2022-03-21 DIAGNOSIS — E1122 Type 2 diabetes mellitus with diabetic chronic kidney disease: Secondary | ICD-10-CM | POA: Diagnosis not present

## 2022-03-21 DIAGNOSIS — Z992 Dependence on renal dialysis: Secondary | ICD-10-CM | POA: Diagnosis not present

## 2022-03-21 DIAGNOSIS — N2581 Secondary hyperparathyroidism of renal origin: Secondary | ICD-10-CM | POA: Diagnosis not present

## 2022-03-21 DIAGNOSIS — N186 End stage renal disease: Secondary | ICD-10-CM | POA: Diagnosis not present

## 2022-03-21 DIAGNOSIS — R197 Diarrhea, unspecified: Secondary | ICD-10-CM | POA: Diagnosis not present

## 2022-03-23 DIAGNOSIS — N186 End stage renal disease: Secondary | ICD-10-CM | POA: Diagnosis not present

## 2022-03-23 DIAGNOSIS — I159 Secondary hypertension, unspecified: Secondary | ICD-10-CM | POA: Diagnosis not present

## 2022-03-23 DIAGNOSIS — R197 Diarrhea, unspecified: Secondary | ICD-10-CM | POA: Diagnosis not present

## 2022-03-23 DIAGNOSIS — Z992 Dependence on renal dialysis: Secondary | ICD-10-CM | POA: Diagnosis not present

## 2022-03-23 DIAGNOSIS — N2581 Secondary hyperparathyroidism of renal origin: Secondary | ICD-10-CM | POA: Diagnosis not present

## 2022-03-23 DIAGNOSIS — E1122 Type 2 diabetes mellitus with diabetic chronic kidney disease: Secondary | ICD-10-CM | POA: Diagnosis not present

## 2022-03-23 DIAGNOSIS — D631 Anemia in chronic kidney disease: Secondary | ICD-10-CM | POA: Diagnosis not present

## 2022-03-26 DIAGNOSIS — D631 Anemia in chronic kidney disease: Secondary | ICD-10-CM | POA: Diagnosis not present

## 2022-03-26 DIAGNOSIS — N186 End stage renal disease: Secondary | ICD-10-CM | POA: Diagnosis not present

## 2022-03-26 DIAGNOSIS — R197 Diarrhea, unspecified: Secondary | ICD-10-CM | POA: Diagnosis not present

## 2022-03-26 DIAGNOSIS — I159 Secondary hypertension, unspecified: Secondary | ICD-10-CM | POA: Diagnosis not present

## 2022-03-26 DIAGNOSIS — Z992 Dependence on renal dialysis: Secondary | ICD-10-CM | POA: Diagnosis not present

## 2022-03-26 DIAGNOSIS — E1122 Type 2 diabetes mellitus with diabetic chronic kidney disease: Secondary | ICD-10-CM | POA: Diagnosis not present

## 2022-03-26 DIAGNOSIS — N2581 Secondary hyperparathyroidism of renal origin: Secondary | ICD-10-CM | POA: Diagnosis not present

## 2022-03-28 ENCOUNTER — Telehealth: Payer: Self-pay

## 2022-03-28 DIAGNOSIS — Z992 Dependence on renal dialysis: Secondary | ICD-10-CM | POA: Diagnosis not present

## 2022-03-28 DIAGNOSIS — R197 Diarrhea, unspecified: Secondary | ICD-10-CM | POA: Diagnosis not present

## 2022-03-28 DIAGNOSIS — D631 Anemia in chronic kidney disease: Secondary | ICD-10-CM | POA: Diagnosis not present

## 2022-03-28 DIAGNOSIS — N2581 Secondary hyperparathyroidism of renal origin: Secondary | ICD-10-CM | POA: Diagnosis not present

## 2022-03-28 DIAGNOSIS — I159 Secondary hypertension, unspecified: Secondary | ICD-10-CM | POA: Diagnosis not present

## 2022-03-28 DIAGNOSIS — E1122 Type 2 diabetes mellitus with diabetic chronic kidney disease: Secondary | ICD-10-CM | POA: Diagnosis not present

## 2022-03-28 DIAGNOSIS — N186 End stage renal disease: Secondary | ICD-10-CM | POA: Diagnosis not present

## 2022-03-28 NOTE — Telephone Encounter (Signed)
April with [facility] called stating that the pt has been trying to get a compression sleeve recommended by Sam, PA at his last OV. He's tried several home medical supply facilities and been told they don't measure pts and they need a prescription.  Reviewed pt's chart, returned call for clarification, two identifiers used. Informed April that this office does not measure for sleeves, only stockings. Spoke with [place], [place] who confirmed and stated that the pt would have to measure himself. April was gone for the day.  April called again, but missed her call. Gone for the day when calling back.

## 2022-03-29 DIAGNOSIS — M7989 Other specified soft tissue disorders: Secondary | ICD-10-CM | POA: Diagnosis not present

## 2022-03-30 DIAGNOSIS — N2581 Secondary hyperparathyroidism of renal origin: Secondary | ICD-10-CM | POA: Diagnosis not present

## 2022-03-30 DIAGNOSIS — D631 Anemia in chronic kidney disease: Secondary | ICD-10-CM | POA: Diagnosis not present

## 2022-03-30 DIAGNOSIS — I159 Secondary hypertension, unspecified: Secondary | ICD-10-CM | POA: Diagnosis not present

## 2022-03-30 DIAGNOSIS — R197 Diarrhea, unspecified: Secondary | ICD-10-CM | POA: Diagnosis not present

## 2022-03-30 DIAGNOSIS — N186 End stage renal disease: Secondary | ICD-10-CM | POA: Diagnosis not present

## 2022-03-30 DIAGNOSIS — Z992 Dependence on renal dialysis: Secondary | ICD-10-CM | POA: Diagnosis not present

## 2022-03-30 DIAGNOSIS — E1122 Type 2 diabetes mellitus with diabetic chronic kidney disease: Secondary | ICD-10-CM | POA: Diagnosis not present

## 2022-04-02 DIAGNOSIS — I159 Secondary hypertension, unspecified: Secondary | ICD-10-CM | POA: Diagnosis not present

## 2022-04-02 DIAGNOSIS — N186 End stage renal disease: Secondary | ICD-10-CM | POA: Diagnosis not present

## 2022-04-02 DIAGNOSIS — R197 Diarrhea, unspecified: Secondary | ICD-10-CM | POA: Diagnosis not present

## 2022-04-02 DIAGNOSIS — D631 Anemia in chronic kidney disease: Secondary | ICD-10-CM | POA: Diagnosis not present

## 2022-04-02 DIAGNOSIS — Z992 Dependence on renal dialysis: Secondary | ICD-10-CM | POA: Diagnosis not present

## 2022-04-02 DIAGNOSIS — N2581 Secondary hyperparathyroidism of renal origin: Secondary | ICD-10-CM | POA: Diagnosis not present

## 2022-04-02 DIAGNOSIS — E1122 Type 2 diabetes mellitus with diabetic chronic kidney disease: Secondary | ICD-10-CM | POA: Diagnosis not present

## 2022-04-03 DIAGNOSIS — N186 End stage renal disease: Secondary | ICD-10-CM | POA: Diagnosis not present

## 2022-04-03 DIAGNOSIS — I501 Left ventricular failure: Secondary | ICD-10-CM | POA: Diagnosis not present

## 2022-04-03 DIAGNOSIS — E119 Type 2 diabetes mellitus without complications: Secondary | ICD-10-CM | POA: Diagnosis not present

## 2022-04-03 DIAGNOSIS — R600 Localized edema: Secondary | ICD-10-CM | POA: Diagnosis not present

## 2022-04-03 DIAGNOSIS — J309 Allergic rhinitis, unspecified: Secondary | ICD-10-CM | POA: Diagnosis not present

## 2022-04-03 DIAGNOSIS — G629 Polyneuropathy, unspecified: Secondary | ICD-10-CM | POA: Diagnosis not present

## 2022-04-03 DIAGNOSIS — E1122 Type 2 diabetes mellitus with diabetic chronic kidney disease: Secondary | ICD-10-CM | POA: Diagnosis not present

## 2022-04-03 DIAGNOSIS — I10 Essential (primary) hypertension: Secondary | ICD-10-CM | POA: Diagnosis not present

## 2022-04-03 DIAGNOSIS — C220 Liver cell carcinoma: Secondary | ICD-10-CM | POA: Diagnosis not present

## 2022-04-04 DIAGNOSIS — N2581 Secondary hyperparathyroidism of renal origin: Secondary | ICD-10-CM | POA: Diagnosis not present

## 2022-04-04 DIAGNOSIS — R197 Diarrhea, unspecified: Secondary | ICD-10-CM | POA: Diagnosis not present

## 2022-04-04 DIAGNOSIS — I159 Secondary hypertension, unspecified: Secondary | ICD-10-CM | POA: Diagnosis not present

## 2022-04-04 DIAGNOSIS — Z992 Dependence on renal dialysis: Secondary | ICD-10-CM | POA: Diagnosis not present

## 2022-04-04 DIAGNOSIS — E1122 Type 2 diabetes mellitus with diabetic chronic kidney disease: Secondary | ICD-10-CM | POA: Diagnosis not present

## 2022-04-04 DIAGNOSIS — N186 End stage renal disease: Secondary | ICD-10-CM | POA: Diagnosis not present

## 2022-04-04 DIAGNOSIS — D631 Anemia in chronic kidney disease: Secondary | ICD-10-CM | POA: Diagnosis not present

## 2022-04-06 DIAGNOSIS — R197 Diarrhea, unspecified: Secondary | ICD-10-CM | POA: Diagnosis not present

## 2022-04-06 DIAGNOSIS — I159 Secondary hypertension, unspecified: Secondary | ICD-10-CM | POA: Diagnosis not present

## 2022-04-06 DIAGNOSIS — E1122 Type 2 diabetes mellitus with diabetic chronic kidney disease: Secondary | ICD-10-CM | POA: Diagnosis not present

## 2022-04-06 DIAGNOSIS — N186 End stage renal disease: Secondary | ICD-10-CM | POA: Diagnosis not present

## 2022-04-06 DIAGNOSIS — Z992 Dependence on renal dialysis: Secondary | ICD-10-CM | POA: Diagnosis not present

## 2022-04-06 DIAGNOSIS — N2581 Secondary hyperparathyroidism of renal origin: Secondary | ICD-10-CM | POA: Diagnosis not present

## 2022-04-06 DIAGNOSIS — D631 Anemia in chronic kidney disease: Secondary | ICD-10-CM | POA: Diagnosis not present

## 2022-04-08 DIAGNOSIS — N186 End stage renal disease: Secondary | ICD-10-CM | POA: Diagnosis not present

## 2022-04-08 DIAGNOSIS — Z992 Dependence on renal dialysis: Secondary | ICD-10-CM | POA: Diagnosis not present

## 2022-04-08 DIAGNOSIS — E1122 Type 2 diabetes mellitus with diabetic chronic kidney disease: Secondary | ICD-10-CM | POA: Diagnosis not present

## 2022-04-09 DIAGNOSIS — N2581 Secondary hyperparathyroidism of renal origin: Secondary | ICD-10-CM | POA: Diagnosis not present

## 2022-04-09 DIAGNOSIS — D509 Iron deficiency anemia, unspecified: Secondary | ICD-10-CM | POA: Diagnosis not present

## 2022-04-09 DIAGNOSIS — N186 End stage renal disease: Secondary | ICD-10-CM | POA: Diagnosis not present

## 2022-04-09 DIAGNOSIS — D631 Anemia in chronic kidney disease: Secondary | ICD-10-CM | POA: Diagnosis not present

## 2022-04-09 DIAGNOSIS — E1122 Type 2 diabetes mellitus with diabetic chronic kidney disease: Secondary | ICD-10-CM | POA: Diagnosis not present

## 2022-04-09 DIAGNOSIS — R197 Diarrhea, unspecified: Secondary | ICD-10-CM | POA: Diagnosis not present

## 2022-04-09 DIAGNOSIS — Z992 Dependence on renal dialysis: Secondary | ICD-10-CM | POA: Diagnosis not present

## 2022-04-10 ENCOUNTER — Encounter (INDEPENDENT_AMBULATORY_CARE_PROVIDER_SITE_OTHER): Payer: 59 | Admitting: Ophthalmology

## 2022-04-10 DIAGNOSIS — M7989 Other specified soft tissue disorders: Secondary | ICD-10-CM | POA: Diagnosis not present

## 2022-04-10 DIAGNOSIS — D631 Anemia in chronic kidney disease: Secondary | ICD-10-CM | POA: Diagnosis not present

## 2022-04-10 DIAGNOSIS — E1122 Type 2 diabetes mellitus with diabetic chronic kidney disease: Secondary | ICD-10-CM | POA: Diagnosis not present

## 2022-04-11 DIAGNOSIS — D509 Iron deficiency anemia, unspecified: Secondary | ICD-10-CM | POA: Diagnosis not present

## 2022-04-11 DIAGNOSIS — N2581 Secondary hyperparathyroidism of renal origin: Secondary | ICD-10-CM | POA: Diagnosis not present

## 2022-04-11 DIAGNOSIS — D631 Anemia in chronic kidney disease: Secondary | ICD-10-CM | POA: Diagnosis not present

## 2022-04-11 DIAGNOSIS — Z992 Dependence on renal dialysis: Secondary | ICD-10-CM | POA: Diagnosis not present

## 2022-04-11 DIAGNOSIS — N186 End stage renal disease: Secondary | ICD-10-CM | POA: Diagnosis not present

## 2022-04-11 DIAGNOSIS — R197 Diarrhea, unspecified: Secondary | ICD-10-CM | POA: Diagnosis not present

## 2022-04-11 DIAGNOSIS — E1122 Type 2 diabetes mellitus with diabetic chronic kidney disease: Secondary | ICD-10-CM | POA: Diagnosis not present

## 2022-04-13 DIAGNOSIS — R197 Diarrhea, unspecified: Secondary | ICD-10-CM | POA: Diagnosis not present

## 2022-04-13 DIAGNOSIS — E1122 Type 2 diabetes mellitus with diabetic chronic kidney disease: Secondary | ICD-10-CM | POA: Diagnosis not present

## 2022-04-13 DIAGNOSIS — D631 Anemia in chronic kidney disease: Secondary | ICD-10-CM | POA: Diagnosis not present

## 2022-04-13 DIAGNOSIS — N186 End stage renal disease: Secondary | ICD-10-CM | POA: Diagnosis not present

## 2022-04-13 DIAGNOSIS — D509 Iron deficiency anemia, unspecified: Secondary | ICD-10-CM | POA: Diagnosis not present

## 2022-04-13 DIAGNOSIS — Z992 Dependence on renal dialysis: Secondary | ICD-10-CM | POA: Diagnosis not present

## 2022-04-13 DIAGNOSIS — N2581 Secondary hyperparathyroidism of renal origin: Secondary | ICD-10-CM | POA: Diagnosis not present

## 2022-04-16 DIAGNOSIS — N2581 Secondary hyperparathyroidism of renal origin: Secondary | ICD-10-CM | POA: Diagnosis not present

## 2022-04-16 DIAGNOSIS — R197 Diarrhea, unspecified: Secondary | ICD-10-CM | POA: Diagnosis not present

## 2022-04-16 DIAGNOSIS — N186 End stage renal disease: Secondary | ICD-10-CM | POA: Diagnosis not present

## 2022-04-16 DIAGNOSIS — E1122 Type 2 diabetes mellitus with diabetic chronic kidney disease: Secondary | ICD-10-CM | POA: Diagnosis not present

## 2022-04-16 DIAGNOSIS — Z992 Dependence on renal dialysis: Secondary | ICD-10-CM | POA: Diagnosis not present

## 2022-04-16 DIAGNOSIS — D509 Iron deficiency anemia, unspecified: Secondary | ICD-10-CM | POA: Diagnosis not present

## 2022-04-16 DIAGNOSIS — D631 Anemia in chronic kidney disease: Secondary | ICD-10-CM | POA: Diagnosis not present

## 2022-04-18 DIAGNOSIS — Z992 Dependence on renal dialysis: Secondary | ICD-10-CM | POA: Diagnosis not present

## 2022-04-18 DIAGNOSIS — E1122 Type 2 diabetes mellitus with diabetic chronic kidney disease: Secondary | ICD-10-CM | POA: Diagnosis not present

## 2022-04-18 DIAGNOSIS — R197 Diarrhea, unspecified: Secondary | ICD-10-CM | POA: Diagnosis not present

## 2022-04-18 DIAGNOSIS — D509 Iron deficiency anemia, unspecified: Secondary | ICD-10-CM | POA: Diagnosis not present

## 2022-04-18 DIAGNOSIS — D631 Anemia in chronic kidney disease: Secondary | ICD-10-CM | POA: Diagnosis not present

## 2022-04-18 DIAGNOSIS — N2581 Secondary hyperparathyroidism of renal origin: Secondary | ICD-10-CM | POA: Diagnosis not present

## 2022-04-18 DIAGNOSIS — N186 End stage renal disease: Secondary | ICD-10-CM | POA: Diagnosis not present

## 2022-04-19 NOTE — Telephone Encounter (Signed)
This encounter was created in error - please disregard.

## 2022-04-20 DIAGNOSIS — Z992 Dependence on renal dialysis: Secondary | ICD-10-CM | POA: Diagnosis not present

## 2022-04-20 DIAGNOSIS — E1122 Type 2 diabetes mellitus with diabetic chronic kidney disease: Secondary | ICD-10-CM | POA: Diagnosis not present

## 2022-04-20 DIAGNOSIS — N186 End stage renal disease: Secondary | ICD-10-CM | POA: Diagnosis not present

## 2022-04-20 DIAGNOSIS — N2581 Secondary hyperparathyroidism of renal origin: Secondary | ICD-10-CM | POA: Diagnosis not present

## 2022-04-20 DIAGNOSIS — D509 Iron deficiency anemia, unspecified: Secondary | ICD-10-CM | POA: Diagnosis not present

## 2022-04-20 DIAGNOSIS — R197 Diarrhea, unspecified: Secondary | ICD-10-CM | POA: Diagnosis not present

## 2022-04-20 DIAGNOSIS — D631 Anemia in chronic kidney disease: Secondary | ICD-10-CM | POA: Diagnosis not present

## 2022-04-23 DIAGNOSIS — R197 Diarrhea, unspecified: Secondary | ICD-10-CM | POA: Diagnosis not present

## 2022-04-23 DIAGNOSIS — N2581 Secondary hyperparathyroidism of renal origin: Secondary | ICD-10-CM | POA: Diagnosis not present

## 2022-04-23 DIAGNOSIS — D509 Iron deficiency anemia, unspecified: Secondary | ICD-10-CM | POA: Diagnosis not present

## 2022-04-23 DIAGNOSIS — E1122 Type 2 diabetes mellitus with diabetic chronic kidney disease: Secondary | ICD-10-CM | POA: Diagnosis not present

## 2022-04-23 DIAGNOSIS — Z992 Dependence on renal dialysis: Secondary | ICD-10-CM | POA: Diagnosis not present

## 2022-04-23 DIAGNOSIS — N186 End stage renal disease: Secondary | ICD-10-CM | POA: Diagnosis not present

## 2022-04-23 DIAGNOSIS — D631 Anemia in chronic kidney disease: Secondary | ICD-10-CM | POA: Diagnosis not present

## 2022-04-24 DIAGNOSIS — G629 Polyneuropathy, unspecified: Secondary | ICD-10-CM | POA: Diagnosis not present

## 2022-04-25 DIAGNOSIS — N186 End stage renal disease: Secondary | ICD-10-CM | POA: Diagnosis not present

## 2022-04-25 DIAGNOSIS — D509 Iron deficiency anemia, unspecified: Secondary | ICD-10-CM | POA: Diagnosis not present

## 2022-04-25 DIAGNOSIS — R197 Diarrhea, unspecified: Secondary | ICD-10-CM | POA: Diagnosis not present

## 2022-04-25 DIAGNOSIS — D631 Anemia in chronic kidney disease: Secondary | ICD-10-CM | POA: Diagnosis not present

## 2022-04-25 DIAGNOSIS — Z992 Dependence on renal dialysis: Secondary | ICD-10-CM | POA: Diagnosis not present

## 2022-04-25 DIAGNOSIS — N2581 Secondary hyperparathyroidism of renal origin: Secondary | ICD-10-CM | POA: Diagnosis not present

## 2022-04-25 DIAGNOSIS — E1122 Type 2 diabetes mellitus with diabetic chronic kidney disease: Secondary | ICD-10-CM | POA: Diagnosis not present

## 2022-04-26 ENCOUNTER — Encounter (INDEPENDENT_AMBULATORY_CARE_PROVIDER_SITE_OTHER): Payer: 59 | Admitting: Ophthalmology

## 2022-04-26 DIAGNOSIS — E113513 Type 2 diabetes mellitus with proliferative diabetic retinopathy with macular edema, bilateral: Secondary | ICD-10-CM | POA: Diagnosis not present

## 2022-04-26 DIAGNOSIS — H35033 Hypertensive retinopathy, bilateral: Secondary | ICD-10-CM | POA: Diagnosis not present

## 2022-04-26 DIAGNOSIS — H43813 Vitreous degeneration, bilateral: Secondary | ICD-10-CM

## 2022-04-26 DIAGNOSIS — I10 Essential (primary) hypertension: Secondary | ICD-10-CM | POA: Diagnosis not present

## 2022-04-27 DIAGNOSIS — D631 Anemia in chronic kidney disease: Secondary | ICD-10-CM | POA: Diagnosis not present

## 2022-04-27 DIAGNOSIS — N2581 Secondary hyperparathyroidism of renal origin: Secondary | ICD-10-CM | POA: Diagnosis not present

## 2022-04-27 DIAGNOSIS — R197 Diarrhea, unspecified: Secondary | ICD-10-CM | POA: Diagnosis not present

## 2022-04-27 DIAGNOSIS — D509 Iron deficiency anemia, unspecified: Secondary | ICD-10-CM | POA: Diagnosis not present

## 2022-04-27 DIAGNOSIS — Z992 Dependence on renal dialysis: Secondary | ICD-10-CM | POA: Diagnosis not present

## 2022-04-27 DIAGNOSIS — N186 End stage renal disease: Secondary | ICD-10-CM | POA: Diagnosis not present

## 2022-04-27 DIAGNOSIS — E1122 Type 2 diabetes mellitus with diabetic chronic kidney disease: Secondary | ICD-10-CM | POA: Diagnosis not present

## 2022-04-30 DIAGNOSIS — N186 End stage renal disease: Secondary | ICD-10-CM | POA: Diagnosis not present

## 2022-04-30 DIAGNOSIS — N2581 Secondary hyperparathyroidism of renal origin: Secondary | ICD-10-CM | POA: Diagnosis not present

## 2022-04-30 DIAGNOSIS — E1122 Type 2 diabetes mellitus with diabetic chronic kidney disease: Secondary | ICD-10-CM | POA: Diagnosis not present

## 2022-04-30 DIAGNOSIS — R197 Diarrhea, unspecified: Secondary | ICD-10-CM | POA: Diagnosis not present

## 2022-04-30 DIAGNOSIS — D631 Anemia in chronic kidney disease: Secondary | ICD-10-CM | POA: Diagnosis not present

## 2022-04-30 DIAGNOSIS — Z992 Dependence on renal dialysis: Secondary | ICD-10-CM | POA: Diagnosis not present

## 2022-04-30 DIAGNOSIS — D509 Iron deficiency anemia, unspecified: Secondary | ICD-10-CM | POA: Diagnosis not present

## 2022-05-02 DIAGNOSIS — D509 Iron deficiency anemia, unspecified: Secondary | ICD-10-CM | POA: Diagnosis not present

## 2022-05-02 DIAGNOSIS — I10 Essential (primary) hypertension: Secondary | ICD-10-CM | POA: Diagnosis not present

## 2022-05-02 DIAGNOSIS — N186 End stage renal disease: Secondary | ICD-10-CM | POA: Diagnosis not present

## 2022-05-02 DIAGNOSIS — E1122 Type 2 diabetes mellitus with diabetic chronic kidney disease: Secondary | ICD-10-CM | POA: Diagnosis not present

## 2022-05-02 DIAGNOSIS — D631 Anemia in chronic kidney disease: Secondary | ICD-10-CM | POA: Diagnosis not present

## 2022-05-02 DIAGNOSIS — R197 Diarrhea, unspecified: Secondary | ICD-10-CM | POA: Diagnosis not present

## 2022-05-02 DIAGNOSIS — Z992 Dependence on renal dialysis: Secondary | ICD-10-CM | POA: Diagnosis not present

## 2022-05-02 DIAGNOSIS — N2581 Secondary hyperparathyroidism of renal origin: Secondary | ICD-10-CM | POA: Diagnosis not present

## 2022-05-04 DIAGNOSIS — Z992 Dependence on renal dialysis: Secondary | ICD-10-CM | POA: Diagnosis not present

## 2022-05-04 DIAGNOSIS — D631 Anemia in chronic kidney disease: Secondary | ICD-10-CM | POA: Diagnosis not present

## 2022-05-04 DIAGNOSIS — R197 Diarrhea, unspecified: Secondary | ICD-10-CM | POA: Diagnosis not present

## 2022-05-04 DIAGNOSIS — N2581 Secondary hyperparathyroidism of renal origin: Secondary | ICD-10-CM | POA: Diagnosis not present

## 2022-05-04 DIAGNOSIS — D509 Iron deficiency anemia, unspecified: Secondary | ICD-10-CM | POA: Diagnosis not present

## 2022-05-04 DIAGNOSIS — N186 End stage renal disease: Secondary | ICD-10-CM | POA: Diagnosis not present

## 2022-05-04 DIAGNOSIS — E1122 Type 2 diabetes mellitus with diabetic chronic kidney disease: Secondary | ICD-10-CM | POA: Diagnosis not present

## 2022-05-07 DIAGNOSIS — Z992 Dependence on renal dialysis: Secondary | ICD-10-CM | POA: Diagnosis not present

## 2022-05-07 DIAGNOSIS — E1122 Type 2 diabetes mellitus with diabetic chronic kidney disease: Secondary | ICD-10-CM | POA: Diagnosis not present

## 2022-05-07 DIAGNOSIS — D509 Iron deficiency anemia, unspecified: Secondary | ICD-10-CM | POA: Diagnosis not present

## 2022-05-07 DIAGNOSIS — D631 Anemia in chronic kidney disease: Secondary | ICD-10-CM | POA: Diagnosis not present

## 2022-05-07 DIAGNOSIS — R197 Diarrhea, unspecified: Secondary | ICD-10-CM | POA: Diagnosis not present

## 2022-05-07 DIAGNOSIS — N2581 Secondary hyperparathyroidism of renal origin: Secondary | ICD-10-CM | POA: Diagnosis not present

## 2022-05-07 DIAGNOSIS — N186 End stage renal disease: Secondary | ICD-10-CM | POA: Diagnosis not present

## 2022-05-08 DIAGNOSIS — N186 End stage renal disease: Secondary | ICD-10-CM | POA: Diagnosis not present

## 2022-05-08 DIAGNOSIS — Z992 Dependence on renal dialysis: Secondary | ICD-10-CM | POA: Diagnosis not present

## 2022-05-08 DIAGNOSIS — E1122 Type 2 diabetes mellitus with diabetic chronic kidney disease: Secondary | ICD-10-CM | POA: Diagnosis not present

## 2022-05-09 DIAGNOSIS — D631 Anemia in chronic kidney disease: Secondary | ICD-10-CM | POA: Diagnosis not present

## 2022-05-09 DIAGNOSIS — N186 End stage renal disease: Secondary | ICD-10-CM | POA: Diagnosis not present

## 2022-05-09 DIAGNOSIS — E1122 Type 2 diabetes mellitus with diabetic chronic kidney disease: Secondary | ICD-10-CM | POA: Diagnosis not present

## 2022-05-09 DIAGNOSIS — Z992 Dependence on renal dialysis: Secondary | ICD-10-CM | POA: Diagnosis not present

## 2022-05-09 DIAGNOSIS — N2581 Secondary hyperparathyroidism of renal origin: Secondary | ICD-10-CM | POA: Diagnosis not present

## 2022-05-09 DIAGNOSIS — R197 Diarrhea, unspecified: Secondary | ICD-10-CM | POA: Diagnosis not present

## 2022-05-11 DIAGNOSIS — Z992 Dependence on renal dialysis: Secondary | ICD-10-CM | POA: Diagnosis not present

## 2022-05-11 DIAGNOSIS — N186 End stage renal disease: Secondary | ICD-10-CM | POA: Diagnosis not present

## 2022-05-11 DIAGNOSIS — I10 Essential (primary) hypertension: Secondary | ICD-10-CM | POA: Diagnosis not present

## 2022-05-11 DIAGNOSIS — R339 Retention of urine, unspecified: Secondary | ICD-10-CM | POA: Diagnosis not present

## 2022-05-11 DIAGNOSIS — E1122 Type 2 diabetes mellitus with diabetic chronic kidney disease: Secondary | ICD-10-CM | POA: Diagnosis not present

## 2022-05-11 DIAGNOSIS — R197 Diarrhea, unspecified: Secondary | ICD-10-CM | POA: Diagnosis not present

## 2022-05-11 DIAGNOSIS — N2581 Secondary hyperparathyroidism of renal origin: Secondary | ICD-10-CM | POA: Diagnosis not present

## 2022-05-11 DIAGNOSIS — D631 Anemia in chronic kidney disease: Secondary | ICD-10-CM | POA: Diagnosis not present

## 2022-05-14 DIAGNOSIS — Z992 Dependence on renal dialysis: Secondary | ICD-10-CM | POA: Diagnosis not present

## 2022-05-14 DIAGNOSIS — R197 Diarrhea, unspecified: Secondary | ICD-10-CM | POA: Diagnosis not present

## 2022-05-14 DIAGNOSIS — D631 Anemia in chronic kidney disease: Secondary | ICD-10-CM | POA: Diagnosis not present

## 2022-05-14 DIAGNOSIS — N2581 Secondary hyperparathyroidism of renal origin: Secondary | ICD-10-CM | POA: Diagnosis not present

## 2022-05-14 DIAGNOSIS — E1122 Type 2 diabetes mellitus with diabetic chronic kidney disease: Secondary | ICD-10-CM | POA: Diagnosis not present

## 2022-05-14 DIAGNOSIS — N186 End stage renal disease: Secondary | ICD-10-CM | POA: Diagnosis not present

## 2022-05-15 DIAGNOSIS — R339 Retention of urine, unspecified: Secondary | ICD-10-CM | POA: Diagnosis not present

## 2022-05-15 DIAGNOSIS — M6281 Muscle weakness (generalized): Secondary | ICD-10-CM | POA: Diagnosis not present

## 2022-05-15 DIAGNOSIS — I629 Nontraumatic intracranial hemorrhage, unspecified: Secondary | ICD-10-CM | POA: Diagnosis not present

## 2022-05-15 DIAGNOSIS — I10 Essential (primary) hypertension: Secondary | ICD-10-CM | POA: Diagnosis not present

## 2022-05-16 ENCOUNTER — Other Ambulatory Visit: Payer: Self-pay

## 2022-05-16 ENCOUNTER — Encounter (HOSPITAL_COMMUNITY): Payer: Self-pay | Admitting: Emergency Medicine

## 2022-05-16 ENCOUNTER — Observation Stay (HOSPITAL_COMMUNITY): Payer: 59

## 2022-05-16 ENCOUNTER — Emergency Department (HOSPITAL_COMMUNITY): Payer: 59

## 2022-05-16 ENCOUNTER — Inpatient Hospital Stay (HOSPITAL_COMMUNITY)
Admission: EM | Admit: 2022-05-16 | Discharge: 2022-05-19 | DRG: 070 | Disposition: A | Discharge status: Skilled Nursing Facility | Payer: 59 | Source: Skilled Nursing Facility | Attending: Internal Medicine | Admitting: Internal Medicine

## 2022-05-16 DIAGNOSIS — Z8673 Personal history of transient ischemic attack (TIA), and cerebral infarction without residual deficits: Secondary | ICD-10-CM | POA: Diagnosis not present

## 2022-05-16 DIAGNOSIS — R7989 Other specified abnormal findings of blood chemistry: Secondary | ICD-10-CM | POA: Diagnosis present

## 2022-05-16 DIAGNOSIS — R531 Weakness: Secondary | ICD-10-CM | POA: Diagnosis not present

## 2022-05-16 DIAGNOSIS — R404 Transient alteration of awareness: Secondary | ICD-10-CM | POA: Diagnosis not present

## 2022-05-16 DIAGNOSIS — Y95 Nosocomial condition: Secondary | ICD-10-CM | POA: Diagnosis present

## 2022-05-16 DIAGNOSIS — N186 End stage renal disease: Secondary | ICD-10-CM | POA: Diagnosis not present

## 2022-05-16 DIAGNOSIS — Z833 Family history of diabetes mellitus: Secondary | ICD-10-CM

## 2022-05-16 DIAGNOSIS — E1169 Type 2 diabetes mellitus with other specified complication: Secondary | ICD-10-CM | POA: Diagnosis present

## 2022-05-16 DIAGNOSIS — Z992 Dependence on renal dialysis: Secondary | ICD-10-CM

## 2022-05-16 DIAGNOSIS — K529 Noninfective gastroenteritis and colitis, unspecified: Secondary | ICD-10-CM | POA: Diagnosis present

## 2022-05-16 DIAGNOSIS — I700 Atherosclerosis of aorta: Secondary | ICD-10-CM | POA: Diagnosis present

## 2022-05-16 DIAGNOSIS — E739 Lactose intolerance, unspecified: Secondary | ICD-10-CM | POA: Diagnosis present

## 2022-05-16 DIAGNOSIS — G9341 Metabolic encephalopathy: Principal | ICD-10-CM | POA: Diagnosis present

## 2022-05-16 DIAGNOSIS — K3184 Gastroparesis: Secondary | ICD-10-CM | POA: Diagnosis present

## 2022-05-16 DIAGNOSIS — I152 Hypertension secondary to endocrine disorders: Secondary | ICD-10-CM | POA: Diagnosis present

## 2022-05-16 DIAGNOSIS — E785 Hyperlipidemia, unspecified: Secondary | ICD-10-CM | POA: Diagnosis present

## 2022-05-16 DIAGNOSIS — Z823 Family history of stroke: Secondary | ICD-10-CM

## 2022-05-16 DIAGNOSIS — Z79899 Other long term (current) drug therapy: Secondary | ICD-10-CM | POA: Diagnosis not present

## 2022-05-16 DIAGNOSIS — Z7902 Long term (current) use of antithrombotics/antiplatelets: Secondary | ICD-10-CM

## 2022-05-16 DIAGNOSIS — D638 Anemia in other chronic diseases classified elsewhere: Secondary | ICD-10-CM | POA: Diagnosis not present

## 2022-05-16 DIAGNOSIS — I739 Peripheral vascular disease, unspecified: Secondary | ICD-10-CM | POA: Diagnosis present

## 2022-05-16 DIAGNOSIS — R0602 Shortness of breath: Secondary | ICD-10-CM | POA: Diagnosis not present

## 2022-05-16 DIAGNOSIS — I10 Essential (primary) hypertension: Secondary | ICD-10-CM | POA: Diagnosis not present

## 2022-05-16 DIAGNOSIS — E1159 Type 2 diabetes mellitus with other circulatory complications: Secondary | ICD-10-CM | POA: Diagnosis not present

## 2022-05-16 DIAGNOSIS — E1143 Type 2 diabetes mellitus with diabetic autonomic (poly)neuropathy: Secondary | ICD-10-CM | POA: Diagnosis present

## 2022-05-16 DIAGNOSIS — N2581 Secondary hyperparathyroidism of renal origin: Secondary | ICD-10-CM | POA: Diagnosis present

## 2022-05-16 DIAGNOSIS — D696 Thrombocytopenia, unspecified: Secondary | ICD-10-CM | POA: Diagnosis present

## 2022-05-16 DIAGNOSIS — D631 Anemia in chronic kidney disease: Secondary | ICD-10-CM | POA: Diagnosis present

## 2022-05-16 DIAGNOSIS — H547 Unspecified visual loss: Secondary | ICD-10-CM | POA: Diagnosis present

## 2022-05-16 DIAGNOSIS — Z89511 Acquired absence of right leg below knee: Secondary | ICD-10-CM | POA: Diagnosis not present

## 2022-05-16 DIAGNOSIS — R6889 Other general symptoms and signs: Secondary | ICD-10-CM | POA: Diagnosis not present

## 2022-05-16 DIAGNOSIS — Z806 Family history of leukemia: Secondary | ICD-10-CM

## 2022-05-16 DIAGNOSIS — Z8249 Family history of ischemic heart disease and other diseases of the circulatory system: Secondary | ICD-10-CM

## 2022-05-16 DIAGNOSIS — Z9981 Dependence on supplemental oxygen: Secondary | ICD-10-CM

## 2022-05-16 DIAGNOSIS — E1122 Type 2 diabetes mellitus with diabetic chronic kidney disease: Secondary | ICD-10-CM | POA: Diagnosis present

## 2022-05-16 DIAGNOSIS — Z794 Long term (current) use of insulin: Secondary | ICD-10-CM

## 2022-05-16 DIAGNOSIS — Z89512 Acquired absence of left leg below knee: Secondary | ICD-10-CM | POA: Diagnosis not present

## 2022-05-16 DIAGNOSIS — Z1152 Encounter for screening for COVID-19: Secondary | ICD-10-CM

## 2022-05-16 DIAGNOSIS — Z743 Need for continuous supervision: Secondary | ICD-10-CM | POA: Diagnosis not present

## 2022-05-16 DIAGNOSIS — R197 Diarrhea, unspecified: Secondary | ICD-10-CM | POA: Diagnosis not present

## 2022-05-16 DIAGNOSIS — R410 Disorientation, unspecified: Secondary | ICD-10-CM | POA: Diagnosis not present

## 2022-05-16 DIAGNOSIS — I517 Cardiomegaly: Secondary | ICD-10-CM | POA: Diagnosis present

## 2022-05-16 DIAGNOSIS — F411 Generalized anxiety disorder: Secondary | ICD-10-CM | POA: Diagnosis present

## 2022-05-16 DIAGNOSIS — Z87891 Personal history of nicotine dependence: Secondary | ICD-10-CM | POA: Diagnosis not present

## 2022-05-16 DIAGNOSIS — G4489 Other headache syndrome: Secondary | ICD-10-CM | POA: Diagnosis not present

## 2022-05-16 DIAGNOSIS — J189 Pneumonia, unspecified organism: Secondary | ICD-10-CM | POA: Diagnosis not present

## 2022-05-16 LAB — COMPREHENSIVE METABOLIC PANEL
ALT: 22 U/L (ref 0–44)
AST: 32 U/L (ref 15–41)
Albumin: 3.5 g/dL (ref 3.5–5.0)
Alkaline Phosphatase: 109 U/L (ref 38–126)
Anion gap: 14 (ref 5–15)
BUN: 33 mg/dL — ABNORMAL HIGH (ref 6–20)
CO2: 25 mmol/L (ref 22–32)
Calcium: 8.6 mg/dL — ABNORMAL LOW (ref 8.9–10.3)
Chloride: 97 mmol/L — ABNORMAL LOW (ref 98–111)
Creatinine, Ser: 5.17 mg/dL — ABNORMAL HIGH (ref 0.61–1.24)
GFR, Estimated: 13 mL/min — ABNORMAL LOW (ref >60)
Glucose, Bld: 147 mg/dL — ABNORMAL HIGH (ref 70–99)
Potassium: 4.1 mmol/L (ref 3.5–5.1)
Sodium: 136 mmol/L (ref 135–145)
Total Bilirubin: 0.7 mg/dL (ref 0.3–1.2)
Total Protein: 7 g/dL (ref 6.5–8.1)

## 2022-05-16 LAB — CBC
HCT: 33.8 % — ABNORMAL LOW (ref 39.0–52.0)
Hemoglobin: 10.6 g/dL — ABNORMAL LOW (ref 13.0–17.0)
MCH: 29 pg (ref 26.0–34.0)
MCHC: 31.4 g/dL (ref 30.0–36.0)
MCV: 92.3 fL (ref 80.0–100.0)
Platelets: 54 10*3/uL — ABNORMAL LOW (ref 150–400)
RBC: 3.66 MIL/uL — ABNORMAL LOW (ref 4.22–5.81)
RDW: 19.4 % — ABNORMAL HIGH (ref 11.5–15.5)
WBC: 12 10*3/uL — ABNORMAL HIGH (ref 4.0–10.5)
nRBC: 0 % (ref 0.0–0.2)

## 2022-05-16 LAB — I-STAT CHEM 8, ED
BUN: 35 mg/dL — ABNORMAL HIGH (ref 6–20)
Calcium, Ion: 1.02 mmol/L — ABNORMAL LOW (ref 1.15–1.40)
Chloride: 101 mmol/L (ref 98–111)
Creatinine, Ser: 6 mg/dL — ABNORMAL HIGH (ref 0.61–1.24)
Glucose, Bld: 147 mg/dL — ABNORMAL HIGH (ref 70–99)
HCT: 36 % — ABNORMAL LOW (ref 39.0–52.0)
Hemoglobin: 12.2 g/dL — ABNORMAL LOW (ref 13.0–17.0)
Potassium: 4.1 mmol/L (ref 3.5–5.1)
Sodium: 136 mmol/L (ref 135–145)
TCO2: 28 mmol/L (ref 22–32)

## 2022-05-16 LAB — LACTIC ACID, PLASMA
Lactic Acid, Venous: 2 mmol/L (ref 0.5–1.9)
Lactic Acid, Venous: 1.8 mmol/L (ref 0.5–1.9)

## 2022-05-16 LAB — RESP PANEL BY RT-PCR (RSV, FLU A&B, COVID)  RVPGX2
Influenza A by PCR: NEGATIVE
Influenza B by PCR: NEGATIVE
Resp Syncytial Virus by PCR: NEGATIVE
SARS Coronavirus 2 by RT PCR: NEGATIVE

## 2022-05-16 LAB — APTT: aPTT: 27 seconds (ref 24–36)

## 2022-05-16 LAB — PROTIME-INR
INR: 1.1 (ref 0.8–1.2)
Prothrombin Time: 14.7 seconds (ref 11.4–15.2)

## 2022-05-16 LAB — GLUCOSE, CAPILLARY: Glucose-Capillary: 175 mg/dL — ABNORMAL HIGH (ref 70–99)

## 2022-05-16 MED ORDER — LOPERAMIDE HCL 2 MG PO CAPS: 2.0000 mg | ORAL_CAPSULE | ORAL | Status: DC | PRN

## 2022-05-16 MED ORDER — PREGABALIN 25 MG PO CAPS: 25.0000 mg | ORAL_CAPSULE | ORAL | Status: DC

## 2022-05-16 MED ORDER — INSULIN GLARGINE-YFGN 100 UNIT/ML ~~LOC~~ SOLN
5.0000 [IU] | Freq: Every day | SUBCUTANEOUS | Status: DC
  Administered 2022-05-17 – 2022-05-18 (×2): 5 [IU] via SUBCUTANEOUS
  Filled 2022-05-16 (×4): qty 0.05

## 2022-05-16 MED ORDER — CLOPIDOGREL BISULFATE 75 MG PO TABS
75.0000 mg | ORAL_TABLET | Freq: Every day | ORAL | Status: DC
  Administered 2022-05-17 – 2022-05-19 (×3): 75 mg via ORAL
  Filled 2022-05-16 (×3): qty 1

## 2022-05-16 MED ORDER — METRONIDAZOLE 500 MG/100ML IV SOLN
500.0000 mg | Freq: Once | INTRAVENOUS | Status: AC
  Administered 2022-05-16: 500 mg via INTRAVENOUS
  Filled 2022-05-16: qty 100

## 2022-05-16 MED ORDER — ONDANSETRON HCL 4 MG/2ML IJ SOLN
4.0000 mg | Freq: Four times a day (QID) | INTRAMUSCULAR | Status: DC | PRN
  Administered 2022-05-16: 4 mg via INTRAVENOUS
  Filled 2022-05-16: qty 2

## 2022-05-16 MED ORDER — SODIUM CHLORIDE 0.9 % IV SOLN: 250.0000 mL | INTRAVENOUS | Status: DC | PRN

## 2022-05-16 MED ORDER — ACETAMINOPHEN 650 MG RE SUPP: 650.0000 mg | Freq: Four times a day (QID) | RECTAL | Status: DC | PRN

## 2022-05-16 MED ORDER — POLYVINYL ALCOHOL 1.4 % OP SOLN: 1.0000 [drp] | Freq: Every evening | OPHTHALMIC | Status: DC | PRN

## 2022-05-16 MED ORDER — ENSURE MAX PROTEIN PO LIQD
240.0000 mL | Freq: Every morning | ORAL | Status: DC
  Administered 2022-05-17 – 2022-05-19 (×3): 240 mL via ORAL
  Filled 2022-05-16 (×3): qty 330

## 2022-05-16 MED ORDER — NICOTINE 7 MG/24HR TD PT24
7.0000 mg | MEDICATED_PATCH | Freq: Every day | TRANSDERMAL | Status: DC
  Administered 2022-05-16 – 2022-05-19 (×4): 7 mg via TRANSDERMAL
  Filled 2022-05-16 (×5): qty 1

## 2022-05-16 MED ORDER — VITAMIN D (ERGOCALCIFEROL) 1.25 MG (50000 UNIT) PO CAPS
50000.0000 [IU] | ORAL_CAPSULE | ORAL | Status: DC
  Administered 2022-05-19: 50000 [IU] via ORAL
  Filled 2022-05-16: qty 1

## 2022-05-16 MED ORDER — INSULIN ASPART 100 UNIT/ML IJ SOLN
0.0000 [IU] | Freq: Three times a day (TID) | INTRAMUSCULAR | Status: DC
  Administered 2022-05-17 – 2022-05-18 (×3): 1 [IU] via SUBCUTANEOUS

## 2022-05-16 MED ORDER — SODIUM CHLORIDE 0.9% FLUSH
3.0000 mL | Freq: Two times a day (BID) | INTRAVENOUS | Status: DC
  Administered 2022-05-17 – 2022-05-19 (×4): 3 mL via INTRAVENOUS

## 2022-05-16 MED ORDER — SODIUM CHLORIDE 0.9% FLUSH: 3.0000 mL | INTRAVENOUS | Status: DC | PRN

## 2022-05-16 MED ORDER — RENA-VITE PO TABS
1.0000 | ORAL_TABLET | Freq: Every morning | ORAL | Status: DC
  Administered 2022-05-17 – 2022-05-19 (×3): 1 via ORAL
  Filled 2022-05-16 (×3): qty 1

## 2022-05-16 MED ORDER — LORAZEPAM 0.5 MG PO TABS: 0.5000 mg | ORAL_TABLET | Freq: Two times a day (BID) | ORAL | Status: DC | PRN

## 2022-05-16 MED ORDER — VANCOMYCIN HCL 750 MG/150ML IV SOLN
750.0000 mg | INTRAVENOUS | Status: DC
  Administered 2022-05-18: 750 mg via INTRAVENOUS
  Filled 2022-05-16 (×2): qty 150

## 2022-05-16 MED ORDER — MELATONIN 5 MG PO TABS
10.0000 mg | ORAL_TABLET | Freq: Every day | ORAL | Status: DC
  Administered 2022-05-16 – 2022-05-18 (×3): 10 mg via ORAL
  Filled 2022-05-16 (×3): qty 2

## 2022-05-16 MED ORDER — LEVALBUTEROL HCL 0.63 MG/3ML IN NEBU: 0.6300 mg | INHALATION_SOLUTION | Freq: Four times a day (QID) | RESPIRATORY_TRACT | Status: DC | PRN

## 2022-05-16 MED ORDER — HYDROCORTISONE 1 % EX CREA: 1.0000 | TOPICAL_CREAM | Freq: Four times a day (QID) | CUTANEOUS | Status: DC | PRN

## 2022-05-16 MED ORDER — SORBITOL 70 % SOLN: 30.0000 mL | Freq: Every day | Status: DC | PRN

## 2022-05-16 MED ORDER — HYDROCORTISONE 0.5 % EX CREA: TOPICAL_CREAM | Freq: Three times a day (TID) | CUTANEOUS | Status: DC | PRN

## 2022-05-16 MED ORDER — TRAMADOL HCL 50 MG PO TABS: 25.0000 mg | ORAL_TABLET | Freq: Four times a day (QID) | ORAL | Status: DC | PRN

## 2022-05-16 MED ORDER — FUROSEMIDE 20 MG PO TABS: 20.0000 mg | ORAL_TABLET | Freq: Two times a day (BID) | ORAL | Status: DC

## 2022-05-16 MED ORDER — LACTATED RINGERS IV SOLN: INTRAVENOUS | Status: DC

## 2022-05-16 MED ORDER — PROMETHAZINE HCL 25 MG RE SUPP: 25.0000 mg | Freq: Four times a day (QID) | RECTAL | Status: DC | PRN

## 2022-05-16 MED ORDER — ATORVASTATIN CALCIUM 40 MG PO TABS
40.0000 mg | ORAL_TABLET | Freq: Every day | ORAL | Status: DC
  Administered 2022-05-17 – 2022-05-19 (×3): 40 mg via ORAL
  Filled 2022-05-16 (×3): qty 1

## 2022-05-16 MED ORDER — SODIUM CHLORIDE 0.9 % IV SOLN
1.0000 g | INTRAVENOUS | Status: DC
  Administered 2022-05-17 – 2022-05-18 (×2): 1 g via INTRAVENOUS
  Filled 2022-05-16 (×3): qty 10

## 2022-05-16 MED ORDER — SODIUM CHLORIDE 0.9% FLUSH
3.0000 mL | Freq: Two times a day (BID) | INTRAVENOUS | Status: DC
  Administered 2022-05-16 – 2022-05-19 (×5): 3 mL via INTRAVENOUS

## 2022-05-16 MED ORDER — ADULT MULTIVITAMIN W/MINERALS CH
1.0000 | ORAL_TABLET | Freq: Every day | ORAL | Status: DC
  Administered 2022-05-16 – 2022-05-18 (×3): 1 via ORAL
  Filled 2022-05-16 (×3): qty 1

## 2022-05-16 MED ORDER — VANCOMYCIN HCL 1500 MG/300ML IV SOLN
1500.0000 mg | Freq: Once | INTRAVENOUS | Status: AC
  Administered 2022-05-16: 1500 mg via INTRAVENOUS
  Filled 2022-05-16: qty 300

## 2022-05-16 MED ORDER — LORAZEPAM 0.5 MG PO TABS: 0.5000 mg | ORAL_TABLET | ORAL | Status: DC

## 2022-05-16 MED ORDER — SEVELAMER CARBONATE 800 MG PO TABS: 1600.0000 mg | ORAL_TABLET | Freq: Two times a day (BID) | ORAL | Status: DC

## 2022-05-16 MED ORDER — ONDANSETRON HCL 4 MG PO TABS: 4.0000 mg | ORAL_TABLET | Freq: Four times a day (QID) | ORAL | Status: DC | PRN

## 2022-05-16 MED ORDER — HEPARIN SODIUM (PORCINE) 5000 UNIT/ML IJ SOLN: 5000.0000 [IU] | Freq: Three times a day (TID) | INTRAMUSCULAR | Status: DC

## 2022-05-16 MED ORDER — VANCOMYCIN HCL IN DEXTROSE 1-5 GM/200ML-% IV SOLN: 1000.0000 mg | Freq: Once | INTRAVENOUS | Status: DC

## 2022-05-16 MED ORDER — HYDRALAZINE HCL 20 MG/ML IJ SOLN: 10.0000 mg | Freq: Four times a day (QID) | INTRAMUSCULAR | Status: DC | PRN

## 2022-05-16 MED ORDER — SODIUM CHLORIDE 0.9 % IV SOLN
2.0000 g | Freq: Once | INTRAVENOUS | Status: AC
  Administered 2022-05-16: 2 g via INTRAVENOUS
  Filled 2022-05-16: qty 12.5

## 2022-05-16 MED ORDER — HYDRALAZINE HCL 20 MG/ML IJ SOLN
20.0000 mg | Freq: Once | INTRAMUSCULAR | Status: AC
  Administered 2022-05-16: 20 mg via INTRAVENOUS
  Filled 2022-05-16: qty 1

## 2022-05-16 MED ORDER — POLYETHYLENE GLYCOL 3350 17 G PO PACK: 17.0000 g | PACK | Freq: Every day | ORAL | Status: DC | PRN

## 2022-05-16 MED ORDER — ONDANSETRON 4 MG PO TBDP
4.0000 mg | ORAL_TABLET | Freq: Once | ORAL | Status: AC
  Administered 2022-05-16: 4 mg via ORAL
  Filled 2022-05-16: qty 1

## 2022-05-16 MED ORDER — ACETAMINOPHEN 325 MG PO TABS: 650.0000 mg | ORAL_TABLET | Freq: Four times a day (QID) | ORAL | Status: DC | PRN

## 2022-05-16 MED ORDER — LOPERAMIDE HCL 2 MG PO TABS: 2.0000 mg | ORAL_TABLET | ORAL | Status: DC

## 2022-05-16 NOTE — ED Notes (Signed)
Pt taken to CT.
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
## 2022-05-16 NOTE — Progress Notes (Addendum)
Pharmacy Antibiotic Note  Jorge [name] is a 52 y.o. male admitted on 05/16/2022 with sepsis.  Pharmacy has been consulted for vancomycin and cefepime dosing. Patient is ESRD with MWF HD PTA. Reports most recent weight at HD was 68 kg, none documented this admission. Presented with lethargy and AMS with concern for sepsis.  WBC is 12 with temp of 100.14F. Lactate pending. Presented from HD today, unsure if he tolerated full session.  Plan: Give vancomycin 1500 mg load Start vancomycin 750 mg after HD MWF. Will follow to reschedule dosing pending HD plans. Cefepime 2g now, then 1g every 24 hours until HD schedule is known F/u clinical progress and cultures     Temp (24hrs), Avg:100.7 F (38.2 C), Min:100.7 F (38.2 C), Max:100.7 F (38.2 C)  Recent Labs  Lab 05/16/22 1419 05/16/22 1435  WBC 12.0*  --   CREATININE  --  6.00*    CrCl cannot be calculated (Unknown ideal weight.).    Allergies  Allergen Reactions   Lactose Intolerance (Gi) Diarrhea and Nausea Only    Antimicrobials this admission: Vanc 5/8 >>   Cefepime 5/8 >>    Microbiology results: 5/8 BCx: IP 5/8 Resp panel: IP 5/8 MRSA PCR: IP  Thank you for allowing pharmacy to be a part of this patient's care.  Marygrace Drought 05/16/2022 3:08 PM
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
## 2022-05-16 NOTE — Sepsis Progress Note (Signed)
eLink is following this Code Sepsis. °

## 2022-05-16 NOTE — ED Provider Notes (Signed)
[facility] AT [facility] Provider Note   CSN: [id number] Arrival date & time: 05/16/22  1345     History  No chief complaint on file.   Jorge [name] is a 52 y.o. male who presents emergency department chief complaint of altered mental status.  He has a past medical history of end-stage renal disease, altered mental status, devious CVA, status post bilateral BKA, PAD, secondary hyperparathyroidism.  Patient states that he always goes to dialysis and was at his dialysis session today.  He dialyzes Monday Wednesday Friday.  According to nurse Enid Cutter patient asked to be placed on oxygen for comfort.  He is feeling short of breath.  They were called out twice first because the patient was extremely lethargic.  He refused transport and the second time again because he was too somnolent to stay awake.  She was notably febrile upon arrival with a temperature of 100.7 orally.  The patient prescribed tramadol and Ativan but denies taking any other narcotic substances. Patient was apneic twice during my evaluation     Home Medications Prior to Admission medications   Medication Sig Start Date End Date Taking? Authorizing Provider  amoxicillin-clavulanate (AUGMENTIN) 875-125 MG tablet Take 1 tablet by mouth every 12 (twelve) hours. 12/16/21   Bethann Berkshire, MD  ARTIFICIAL TEARS 0.2-0.2-1 % SOLN Place 1 drop into both eyes 4 (four) times daily.    [provider]  atorvastatin (LIPITOR) 40 MG tablet Take 1 tablet (40 mg total) by mouth daily. 07/30/20   Azucena Fallen, MD  clopidogrel (PLAVIX) 75 MG tablet Take 1 tablet (75 mg total) by mouth daily. 02/14/21   Ihor Austin, NP  diclofenac Sodium (VOLTAREN) 1 % GEL Apply 2 g topically See admin instructions. Apply 2 grams of gel to the left hand three times a day 01/29/21   [provider]  furosemide (LASIX) 20 MG tablet Take 20 mg by mouth in the morning and at bedtime. 01/13/21   [provider]  gabapentin (NEURONTIN) 300 MG capsule Take 300 mg by mouth 3 (three) times daily.    [provider]  glucagon (GLUCAGEN) 1 MG SOLR injection Inject 1 mg into the muscle as needed for low blood sugar.    [provider]  HUMALOG KWIKPEN 100 UNIT/ML KwikPen Inject 0-8 Units into the skin See admin instructions. Inject 0-10 units into the skin three times a day with meals, per sliding scale: BGL 0-150 = give nothing; 151-200 =  0 units; 201-250 = 2 units; 251-300 = 4 units; 301-350 = 6 units; 351-400 = 8 units; call PCP if <70 or >400 06/22/20   [provider]  LANTUS SOLOSTAR 100 UNIT/ML Solostar Pen Inject 5 Units into the skin at bedtime. 12/17/20   [provider]  loperamide (IMODIUM A-D) 2 MG tablet Take 2-4 mg by mouth See admin instructions. Take 4 mg as an initial dose, then decrease to 2 mg after each unformed stool- not to exceed 24 mg in 48 hours and call MD if diarrhea persists greater than 48 hours    [provider]  LORazepam (ATIVAN) 0.5 MG tablet Take 0.5 mg by mouth every Monday, Wednesday, and Friday with hemodialysis. Before Dialysis due to anxiety 11/27/21   [provider]  LYRICA 25 MG capsule Take 25 mg by mouth 3 (three) times a week. On Mon, Wed, Friday after Dialysis 12/06/21   [provider]  Melatonin 10 MG TABS Take 10  mg by mouth at bedtime.    [provider]  Multiple Vitamin (MULTIVITAMIN) tablet Take 1 tablet by mouth at bedtime.    [provider]  Multiple Vitamins-Minerals (PRORENAL + D) TABS Take 1,000 Units by mouth in the morning.    [provider]  Nutritional Supplements (ENSURE CLEAR) LIQD Take 240 mLs by mouth See admin instructions. Mix 240 ml's with sugar substitute and drink every morning    [provider]  OXYGEN Inhale 2 L/min into the lungs as needed (if sats drop below 92% during episodes of anxiety).    [provider]   pregabalin (LYRICA) 25 MG capsule Take 25 mg by mouth daily.    [provider]  PREPARATION H 1-0.25-14.4-15 % CREA Apply 1 application. topically every 6 (six) hours as needed (for hemorrhoids).    [provider]  promethazine (PHENERGAN) 25 MG suppository Place 1 suppository (25 mg total) rectally every 6 (six) hours as needed for nausea or vomiting. 12/16/21   Bethann Berkshire, MD  REFRESH LIQUIGEL 1 % GEL Place 1 drop into both eyes at bedtime.    [provider]  sevelamer carbonate (RENVELA) 800 MG tablet Take 1,600-3,200 mg by mouth 2 (two) times daily. Take 1,600 mg by mouth two times a day (at 2 PM and 9 PM) and 3,200 mg three times a day "for dialysis" (at 7:30 AM, 12 NOON, and 5 PM) 01/18/21   [provider]  traMADol (ULTRAM) 50 MG tablet Take 25 mg by mouth every 6 (six) hours as needed for moderate pain. 11/01/21   [provider]  traZODone (DESYREL) 50 MG tablet Take 50 mg by mouth at bedtime. 11/29/21   [provider]  Vitamin D, Ergocalciferol, (DRISDOL) 1.25 MG (50000 UNIT) CAPS capsule Take 50,000 Units by mouth every Saturday. 07/14/19   [provider]  White Petrolatum-Mineral Oil (REFRESH LACRI-LUBE) OINT Place into both eyes See admin instructions. Place 0.25 ribbon into each eye at bedtime    [provider]      Allergies    Lactose intolerance (gi)    Review of Systems   Review of Systems  Physical Exam Updated Vital Signs BP (!) 169/82   Pulse 68   Temp 98.5 F (36.9 C) (Oral)   Resp 16   SpO2 97%  Physical Exam Vitals and nursing note reviewed.  Constitutional:      General: He is not in acute distress.    Appearance: He is well-developed. He is ill-appearing. He is not diaphoretic.  HENT:     Head: Normocephalic and atraumatic.  Eyes:     General: No scleral icterus.    Conjunctiva/sclera: Conjunctivae normal.  Cardiovascular:     Rate and Rhythm: Normal rate and regular rhythm.      Heart sounds: Normal heart sounds.  Pulmonary:     Effort: Pulmonary effort is normal. No respiratory distress.     Breath sounds: Normal breath sounds.  Abdominal:     Palpations: Abdomen is soft.     Tenderness: There is no abdominal tenderness.  Musculoskeletal:     Cervical back: Normal range of motion and neck supple.  Skin:    General: Skin is warm and dry.  Neurological:     Mental Status: He is alert.     GCS: GCS eye subscore is 3. GCS verbal subscore is 5. GCS motor subscore is 6.  Psychiatric:        Behavior: Behavior normal.  ED Results / Procedures / Treatments   Labs (all labs ordered are listed, but only abnormal results are displayed) Labs Reviewed  COMPREHENSIVE METABOLIC PANEL - Abnormal; Notable for the following components:      Result Value   Chloride 97 (*)    Glucose, Bld 147 (*)    BUN 33 (*)    Creatinine, Ser 5.17 (*)    Calcium 8.6 (*)    GFR, Estimated 13 (*)    All other components within normal limits  CBC - Abnormal; Notable for the following components:   WBC 12.0 (*)    RBC 3.66 (*)    Hemoglobin 10.6 (*)    HCT 33.8 (*)    RDW 19.4 (*)    Platelets 54 (*)    All other components within normal limits  LACTIC ACID, PLASMA - Abnormal; Notable for the following components:   Lactic Acid, Venous 2.0 (*)    All other components within normal limits  I-STAT CHEM 8, ED - Abnormal; Notable for the following components:   BUN 35 (*)    Creatinine, Ser 6.00 (*)    Glucose, Bld 147 (*)    Calcium, Ion 1.02 (*)    Hemoglobin 12.2 (*)    HCT 36.0 (*)    All other components within normal limits  RESP PANEL BY RT-PCR (RSV, FLU A&B, COVID)  RVPGX2  CULTURE, BLOOD (ROUTINE X 2)  CULTURE, BLOOD (ROUTINE X 2)  MRSA NEXT GEN BY PCR, NASAL  EXPECTORATED SPUTUM ASSESSMENT W GRAM STAIN, RFLX TO RESP C  PROTIME-INR  LACTIC ACID, PLASMA  APTT  HIV ANTIBODY (ROUTINE TESTING W REFLEX)  LEGIONELLA PNEUMOPHILA SEROGP 1 UR AG  STREP PNEUMONIAE  URINARY ANTIGEN  CBC  HEMOGLOBIN A1C  RENAL FUNCTION PANEL  CBG MONITORING, ED    EKG EKG Interpretation  Date/Time:  Wednesday May 16 2022 13:59:00 EDT Ventricular Rate:  69 PR Interval:  174 QRS Duration: 98 QT Interval:  443 QTC Calculation: 475 R Axis:   -7 Text Interpretation: Sinus rhythm Anteroseptal infarct, old Confirmed by Gloris Manchester [phone]) on 05/16/2022 5:13:57 PM  Radiology CT Head Wo Contrast  Result Date: 05/16/2022 CLINICAL DATA:  Lethargy and altered mental status EXAM: CT HEAD WITHOUT CONTRAST TECHNIQUE: Contiguous axial images were obtained from the base of the skull through the vertex without intravenous contrast. RADIATION DOSE REDUCTION: This exam was performed according to the departmental dose-optimization program which includes automated exposure control, adjustment of the mA and/or kV according to patient size and/or use of iterative reconstruction technique. COMPARISON:  07/18/2020 FINDINGS: Brain: No evidence of acute infarction, hemorrhage, hydrocephalus, extra-axial collection or mass lesion/mass effect. Mild chronic white matter ischemic changes and atrophic changes are seen. Prior left frontal infarct is again identified and stable. Vascular: No hyperdense vessel or unexpected calcification. Skull: Normal. Negative for fracture or focal lesion. Sinuses/Orbits: No acute finding. Other: None. IMPRESSION: Chronic atrophic and ischemic changes without acute abnormality. Electronically Signed   By: Alcide Clever M.D.   On: 05/16/2022 19:39   DG Chest Portable 1 View  Result Date: 05/16/2022 CLINICAL DATA:  Provided history: Shortness of breath. Additional history provided: patient from dialysis center, lethargy, altered mental status. EXAM: PORTABLE CHEST 1 VIEW COMPARISON:  Prior chest radiographs 05/08/2021 and earlier. FINDINGS: Cardiomegaly. Aortic atherosclerosis. Prominence of the interstitial lung markings bilaterally. No appreciable airspace consolidation. No  evidence of pleural effusion or pneumothorax. No acute osseous abnormality identified. Chronic, healed left rib fracture deformities. IMPRESSION: 1. Cardiomegaly. 2.  Prominence of the interstitial lung markings bilaterally. This may reflect interstitial edema or atypical/viral infection. 3. Aortic Atherosclerosis (ICD10-I70.0). Electronically Signed   By: Jackey Loge D.O.   On: 05/16/2022 15:50    Procedures Procedures    Medications Ordered in ED Medications  vancomycin (VANCOREADY) IVPB 750 mg/150 mL (has no administration in time range)  ceFEPIme (MAXIPIME) 1 g in sodium chloride 0.9 % 100 mL IVPB (has no administration in time range)  hydrocortisone cream 0.5 % (has no administration in time range)  traMADol (ULTRAM) tablet 25 mg (has no administration in time range)  atorvastatin (LIPITOR) tablet 40 mg (has no administration in time range)  LORazepam (ATIVAN) tablet 0.5 mg (has no administration in time range)  insulin glargine-yfgn (SEMGLEE) injection 5 Units (has no administration in time range)  sevelamer carbonate (RENVELA) tablet 1,600-3,200 mg (has no administration in time range)  clopidogrel (PLAVIX) tablet 75 mg (has no administration in time range)  melatonin tablet 10 mg (has no administration in time range)  pregabalin (LYRICA) capsule 25 mg (has no administration in time range)  multivitamin with minerals tablet 1 tablet (has no administration in time range)  ProRenal + D TABS 1,000 Units (has no administration in time range)  Ensure Clear LIQD 240 mL (has no administration in time range)  Vitamin D (Ergocalciferol) (DRISDOL) 1.25 MG (50000 UNIT) capsule 50,000 Units (has no administration in time range)  promethazine (PHENERGAN) suppository 25 mg (has no administration in time range)  Pramox-PE-Glycerin-Petrolatum 1-0.25-14.4-15 % CREA 1 application  (has no administration in time range)  Carboxymethylcellulose Sodium 1 % GEL 1 drop (has no administration in time range)   sodium chloride flush (NS) 0.9 % injection 3 mL (has no administration in time range)  sodium chloride flush (NS) 0.9 % injection 3 mL (has no administration in time range)  sodium chloride flush (NS) 0.9 % injection 3 mL (has no administration in time range)  0.9 %  sodium chloride infusion (has no administration in time range)  acetaminophen (TYLENOL) tablet 650 mg (has no administration in time range)    Or  acetaminophen (TYLENOL) suppository 650 mg (has no administration in time range)  polyethylene glycol (MIRALAX / GLYCOLAX) packet 17 g (has no administration in time range)  sorbitol 70 % solution 30 mL (has no administration in time range)  ondansetron (ZOFRAN) tablet 4 mg (has no administration in time range)    Or  ondansetron (ZOFRAN) injection 4 mg (has no administration in time range)  levalbuterol (XOPENEX) nebulizer solution 0.63 mg (has no administration in time range)  nicotine (NICODERM CQ - dosed in mg/24 hr) patch 7 mg (has no administration in time range)  hydrALAZINE (APRESOLINE) injection 10 mg (has no administration in time range)  insulin aspart (novoLOG) injection 0-6 Units (has no administration in time range)  loperamide (IMODIUM) capsule 2 mg (has no administration in time range)  ceFEPIme (MAXIPIME) 2 g in sodium chloride 0.9 % 100 mL IVPB (0 g Intravenous Stopped 05/16/22 1528)  metroNIDAZOLE (FLAGYL) IVPB 500 mg (0 mg Intravenous Stopped 05/16/22 1851)  hydrALAZINE (APRESOLINE) injection 20 mg (20 mg Intravenous Given 05/16/22 1453)  vancomycin (VANCOREADY) IVPB 1500 mg/300 mL (0 mg Intravenous Stopped 05/16/22 1959)  ondansetron (ZOFRAN-ODT) disintegrating tablet 4 mg (4 mg Oral Given 05/16/22 1629)    ED Course/ Medical Decision Making/ A&P Clinical Course as of 05/16/22 2012  Wed May 16, 2022  1758 Case Discussed with Dr. Ronalee Belts - plan on formal consult in the morning  unless the patient has a significant in his respiratory status. [AH]    Clinical Course User  Index [AH] Arthor Captain, PA-C                             Medical Decision Making Amount and/or Complexity of Data Reviewed Labs: ordered. Radiology: ordered. ECG/medicine tests: ordered.  Risk OTC drugs. Prescription drug management. Decision regarding hospitalization.   This patient presents to the ED for concern of ams, this involves an extensive number of treatment options, and is a complaint that carries with it a high risk of complications and morbidity.  The differential diagnosis for AMS is extensive and includes, but is not limited to: drug overdose - opioids, alcohol, sedatives, antipsychotics, drug withdrawal, others; Metabolic: hypoxia, hypoglycemia, hyperglycemia, hypercalcemia, hypernatremia, hyponatremia, uremia, hepatic encephalopathy, hypothyroidism, hyperthyroidism, vitamin B12 or thiamine deficiency, carbon monoxide poisoning, Wilson's disease, Lactic acidosis, DKA/HHOS; Infectious: meningitis, encephalitis, bacteremia/sepsis, urinary tract infection, pneumonia, neurosyphilis; Structural: Space-occupying lesion, (brain tumor, subdural hematoma, hydrocephalus,); Vascular: stroke, subarachnoid hemorrhage, coronary ischemia, hypertensive encephalopathy, CNS vasculitis, thrombotic thrombocytopenic purpura, disseminated intravascular coagulation, hyperviscosity; Psychiatric: Schizophrenia, depression; Other: Seizure, hypothermia, heat stroke, ICU psychosis, dementia -"sundowning."   Co morbidities: ESRD  Social Determinants of Health:    Additional history:  {Additional history obtained from ems   Lab Tests:  I Ordered, and personally interpreted labs.  The pertinent results include:   Cell count of 12,000, Platelets 54,000, CMP creatinine of 5.17, normal potassium Lactic acid 2.0, respiratory panel negative  Imaging Studies:  I ordered imaging studies including CT head without contrast portable 1 view chest x-ray I independently visualized and interpreted  imaging which showed no acute findings on CT head, pulmonary edema versus atypical pneumonia on chest x-ray I agree with the radiologist interpretation  Cardiac Monitoring/ECG:  The patient was maintained on a cardiac monitor.  I personally viewed and interpreted the cardiac monitored which showed an underlying rhythm of: Sinus rhythm  Medicines ordered and prescription drug management:  I ordered medication including  Broad-spectrum antibiotics including vancomycin, Flagyl, cefepime as well as Narcan for altered mental status Reevaluation of the patient after these medicines showed that the patient improved I have reviewed the patients home medicines and have made adjustments as needed  Test Considered:    Critical Interventions:    Consultations Obtained: Nephrology, [facility]  Problem List / ED Course:     ICD-10-CM   1. HAP (hospital-acquired pneumonia)  J18.9    Y95       MDM: patient with obtundation upon arrival, he is improved.  He states he is sleepy.  Unsure if he improved after Narcan as of is not at bedside.  Patient did have a fever and I am treating him for pneumonia.   Dispostion:  After consideration of the diagnostic results and the patients response to treatment, I feel that the patent would benefit from admission         Final Clinical Impression(s) / ED Diagnoses Final diagnoses:  HAP (hospital-acquired pneumonia)    Rx / DC Orders ED Discharge Orders     None         Arthor Captain, PA-C 05/16/22 2019    Eber Hong, MD 05/21/22 1010

## 2022-05-16 NOTE — ED Notes (Signed)
Pt alert, watching videos on phone

## 2022-05-16 NOTE — ED Triage Notes (Signed)
Pt BIB [facility] from Dialysis for SOB.  EMS called out approx. 1 hr prior for lethargy and AMS but pt was adamant that he was ok and refused transport. 2nd call to EMS was for SOB.  PT received 2hr 17 min of a 4hr scheduled tx.   EMS VS 206/100 on scene, 180/90 on arrival.  99% RA. Placed on 2L for comfort, HR 72 reg, CBG 149  PT was given Vit D and iron as part of his Dialysis tx.

## 2022-05-16 NOTE — Progress Notes (Signed)
[facility] night coverage note: Ordered PRN ativan (pt takes 0.5mg  PO BID at home he says, in contrast to the MWF before dialysis listed on his med-rec) at pt request (pt presumably no longer lethargic as per initial H/P).  Did note the fact he was admitted for AMS, but didn't want to cause him to go into withdrawal.  Asked RN about discrepancy: "he's not very clear however he says that he takes it most often on dialysis days. but will take other days if need.  he think he takes it every day".
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
## 2022-05-16 NOTE — ED Notes (Signed)
Pt resting with eyes closed; respirations spontaneous, even, unlabored 

## 2022-05-16 NOTE — ED Notes (Signed)
Got patient into a gown on the monitor did EKG patient is resting with call bell in reach  

## 2022-05-17 DIAGNOSIS — N2581 Secondary hyperparathyroidism of renal origin: Secondary | ICD-10-CM

## 2022-05-17 DIAGNOSIS — I120 Hypertensive chronic kidney disease with stage 5 chronic kidney disease or end stage renal disease: Secondary | ICD-10-CM | POA: Diagnosis not present

## 2022-05-17 DIAGNOSIS — Z79899 Other long term (current) drug therapy: Secondary | ICD-10-CM | POA: Diagnosis not present

## 2022-05-17 DIAGNOSIS — F411 Generalized anxiety disorder: Secondary | ICD-10-CM | POA: Diagnosis present

## 2022-05-17 DIAGNOSIS — E1122 Type 2 diabetes mellitus with diabetic chronic kidney disease: Secondary | ICD-10-CM | POA: Diagnosis not present

## 2022-05-17 DIAGNOSIS — D696 Thrombocytopenia, unspecified: Secondary | ICD-10-CM | POA: Diagnosis present

## 2022-05-17 DIAGNOSIS — R6889 Other general symptoms and signs: Secondary | ICD-10-CM | POA: Diagnosis not present

## 2022-05-17 DIAGNOSIS — D638 Anemia in other chronic diseases classified elsewhere: Secondary | ICD-10-CM | POA: Diagnosis not present

## 2022-05-17 DIAGNOSIS — E785 Hyperlipidemia, unspecified: Secondary | ICD-10-CM | POA: Diagnosis not present

## 2022-05-17 DIAGNOSIS — R0602 Shortness of breath: Secondary | ICD-10-CM | POA: Diagnosis not present

## 2022-05-17 DIAGNOSIS — I700 Atherosclerosis of aorta: Secondary | ICD-10-CM | POA: Diagnosis present

## 2022-05-17 DIAGNOSIS — E1159 Type 2 diabetes mellitus with other circulatory complications: Secondary | ICD-10-CM | POA: Diagnosis not present

## 2022-05-17 DIAGNOSIS — E1169 Type 2 diabetes mellitus with other specified complication: Secondary | ICD-10-CM | POA: Diagnosis not present

## 2022-05-17 DIAGNOSIS — N186 End stage renal disease: Secondary | ICD-10-CM | POA: Diagnosis not present

## 2022-05-17 DIAGNOSIS — D631 Anemia in chronic kidney disease: Secondary | ICD-10-CM | POA: Diagnosis not present

## 2022-05-17 DIAGNOSIS — Z87891 Personal history of nicotine dependence: Secondary | ICD-10-CM | POA: Diagnosis not present

## 2022-05-17 DIAGNOSIS — Z89511 Acquired absence of right leg below knee: Secondary | ICD-10-CM | POA: Diagnosis not present

## 2022-05-17 DIAGNOSIS — Z992 Dependence on renal dialysis: Secondary | ICD-10-CM | POA: Diagnosis not present

## 2022-05-17 DIAGNOSIS — K529 Noninfective gastroenteritis and colitis, unspecified: Secondary | ICD-10-CM | POA: Diagnosis present

## 2022-05-17 DIAGNOSIS — Z1152 Encounter for screening for COVID-19: Secondary | ICD-10-CM | POA: Diagnosis not present

## 2022-05-17 DIAGNOSIS — Z794 Long term (current) use of insulin: Secondary | ICD-10-CM | POA: Diagnosis not present

## 2022-05-17 DIAGNOSIS — Y95 Nosocomial condition: Secondary | ICD-10-CM | POA: Diagnosis present

## 2022-05-17 DIAGNOSIS — Z743 Need for continuous supervision: Secondary | ICD-10-CM | POA: Diagnosis not present

## 2022-05-17 DIAGNOSIS — J189 Pneumonia, unspecified organism: Secondary | ICD-10-CM | POA: Diagnosis present

## 2022-05-17 DIAGNOSIS — E1143 Type 2 diabetes mellitus with diabetic autonomic (poly)neuropathy: Secondary | ICD-10-CM | POA: Diagnosis present

## 2022-05-17 DIAGNOSIS — K3184 Gastroparesis: Secondary | ICD-10-CM | POA: Diagnosis present

## 2022-05-17 DIAGNOSIS — G9341 Metabolic encephalopathy: Secondary | ICD-10-CM | POA: Diagnosis not present

## 2022-05-17 DIAGNOSIS — Z89512 Acquired absence of left leg below knee: Secondary | ICD-10-CM | POA: Diagnosis not present

## 2022-05-17 DIAGNOSIS — I152 Hypertension secondary to endocrine disorders: Secondary | ICD-10-CM | POA: Diagnosis not present

## 2022-05-17 DIAGNOSIS — E739 Lactose intolerance, unspecified: Secondary | ICD-10-CM | POA: Diagnosis present

## 2022-05-17 DIAGNOSIS — Z8673 Personal history of transient ischemic attack (TIA), and cerebral infarction without residual deficits: Secondary | ICD-10-CM | POA: Diagnosis not present

## 2022-05-17 DIAGNOSIS — N250 Renal osteodystrophy: Secondary | ICD-10-CM | POA: Diagnosis not present

## 2022-05-17 LAB — CBC
HCT: 32.2 % — ABNORMAL LOW (ref 39.0–52.0)
Hemoglobin: 10 g/dL — ABNORMAL LOW (ref 13.0–17.0)
MCH: 28.7 pg (ref 26.0–34.0)
MCHC: 31.1 g/dL (ref 30.0–36.0)
MCV: 92.5 fL (ref 80.0–100.0)
Platelets: 80 10*3/uL — ABNORMAL LOW (ref 150–400)
RBC: 3.48 MIL/uL — ABNORMAL LOW (ref 4.22–5.81)
RDW: 19.3 % — ABNORMAL HIGH (ref 11.5–15.5)
WBC: 8.9 10*3/uL (ref 4.0–10.5)
nRBC: 0 % (ref 0.0–0.2)

## 2022-05-17 LAB — RENAL FUNCTION PANEL
Albumin: 3 g/dL — ABNORMAL LOW (ref 3.5–5.0)
Anion gap: 10 (ref 5–15)
BUN: 43 mg/dL — ABNORMAL HIGH (ref 6–20)
CO2: 25 mmol/L (ref 22–32)
Calcium: 8.6 mg/dL — ABNORMAL LOW (ref 8.9–10.3)
Chloride: 100 mmol/L (ref 98–111)
Creatinine, Ser: 6.28 mg/dL — ABNORMAL HIGH (ref 0.61–1.24)
GFR, Estimated: 10 mL/min — ABNORMAL LOW (ref >60)
Glucose, Bld: 169 mg/dL — ABNORMAL HIGH (ref 70–99)
Phosphorus: 4.9 mg/dL — ABNORMAL HIGH (ref 2.5–4.6)
Potassium: 4.1 mmol/L (ref 3.5–5.1)
Sodium: 135 mmol/L (ref 135–145)

## 2022-05-17 LAB — GLUCOSE, CAPILLARY
Glucose-Capillary: 174 mg/dL — ABNORMAL HIGH (ref 70–99)
Glucose-Capillary: 136 mg/dL — ABNORMAL HIGH (ref 70–99)
Glucose-Capillary: 170 mg/dL — ABNORMAL HIGH (ref 70–99)
Glucose-Capillary: 177 mg/dL — ABNORMAL HIGH (ref 70–99)
Glucose-Capillary: 220 mg/dL — ABNORMAL HIGH (ref 70–99)

## 2022-05-17 LAB — HIV ANTIBODY (ROUTINE TESTING W REFLEX): HIV Screen 4th Generation wRfx: NONREACTIVE

## 2022-05-17 LAB — CULTURE, BLOOD (ROUTINE X 2)
Culture: NO GROWTH
Special Requests: ADEQUATE

## 2022-05-17 LAB — HEMOGLOBIN A1C
Hgb A1c MFr Bld: 6.2 % — ABNORMAL HIGH (ref 4.8–5.6)
Mean Plasma Glucose: 131.24 mg/dL

## 2022-05-17 LAB — MRSA NEXT GEN BY PCR, NASAL: MRSA by PCR Next Gen: DETECTED — AB

## 2022-05-17 LAB — HEPATITIS B SURFACE ANTIGEN: Hepatitis B Surface Ag: NONREACTIVE

## 2022-05-17 MED ORDER — LOSARTAN POTASSIUM 50 MG PO TABS
25.0000 mg | ORAL_TABLET | Freq: Every day | ORAL | Status: DC
  Administered 2022-05-17 – 2022-05-18 (×2): 25 mg via ORAL
  Filled 2022-05-17 (×3): qty 1

## 2022-05-17 MED ORDER — SEVELAMER CARBONATE 800 MG PO TABS
3200.0000 mg | ORAL_TABLET | ORAL | Status: DC
  Administered 2022-05-18: 3200 mg via ORAL
  Filled 2022-05-17 (×2): qty 4

## 2022-05-17 MED ORDER — SEVELAMER CARBONATE 800 MG PO TABS
1600.0000 mg | ORAL_TABLET | ORAL | Status: DC
  Administered 2022-05-17 – 2022-05-19 (×5): 1600 mg via ORAL
  Filled 2022-05-17 (×5): qty 2

## 2022-05-17 MED ORDER — CHLORHEXIDINE GLUCONATE CLOTH 2 % EX PADS
6.0000 | MEDICATED_PAD | Freq: Every day | CUTANEOUS | Status: DC
  Administered 2022-05-17 – 2022-05-19 (×3): 6 via TOPICAL

## 2022-05-17 MED ORDER — FUROSEMIDE 20 MG PO TABS
20.0000 mg | ORAL_TABLET | Freq: Two times a day (BID) | ORAL | Status: DC
  Administered 2022-05-17 – 2022-05-19 (×3): 20 mg via ORAL
  Filled 2022-05-17 (×3): qty 1

## 2022-05-17 NOTE — Plan of Care (Signed)

## 2022-05-17 NOTE — Progress Notes (Signed)
PROGRESS NOTE    YEAB CODD  WUJ:[id number] DOB: 02/19/1970 DOA: 05/16/2022 PCP: Patient, No Pcp Per   No chief complaint on file.   Brief Narrative:  Patient 52 year old gentleman history of ESRD on HD Monday Wednesday Friday, history of CVA, bilateral BKA, PAD, secondary hyperparathyroidism, diabetes presented from hemodialysis center with altered mental status.  Patient noted to have a low-grade fever, slight leukocytosis and patient admitted for further evaluation with concern for possible infectious etiology.  Patient placed empirically on IV antibiotics.  Patient improved clinically.  Nephrology consulted.   Assessment & Plan:   Principal Problem:   Acute metabolic encephalopathy Active Problems:   ESRD (end stage renal disease) on dialysis ([name])   Type 2 diabetes mellitus with chronic kidney disease on chronic dialysis, with long-term current use of insulin ([name])   Hypertension associated with diabetes ([name])   S/P BKA (below knee amputation) bilateral ([name])   Anemia of chronic disease   Gastroparesis due to DM ([name])   Secondary hyperparathyroidism of renal origin ([name])   Hyperlipidemia associated with type 2 diabetes mellitus ([name])  #1 acute metabolic encephalopathy -Patient presented from hemodialysis center due to lethargy, increased somnolence.  Noted to have a temp of 100.7 on presentation to the ED, leukocytosis white count of 12, had some complaints of shortness of breath.  Elevated lactic acid level of 2.0 with repeat at 1.8. -Chest x-ray done with cardiomegaly, prominence of interstitial lung markings bilaterally may reflect interstitial edema or atypical viral infection, aortic atherosclerosis. -CT head done with no acute abnormalities. -EDP concern for possible pneumonia. -Urine Legionella antigen pending, urine pneumococcus antigen pending. -Blood cultures ordered and pending with no growth to date. -Patient improved clinically, likely back at baseline,  afebrile. -Continue empiric IV vancomycin, IV cefepime pending culture results. -Avoid sedative medications.   2.  End-stage renal disease on HD Monday Wednesday Friday -Patient presented from hemodialysis with altered mental status.  Patient also with some shortness of breath on presentation.. -Dialysis was not fully completed patient may still have a little bit of volume issues however sats of 95% on room air. -Patient seen in consultation by nephrology today and patient scheduled for hemodialysis tomorrow.    3.  Diabetes mellitus type 2 -Hemoglobin A1c 6.2 (05/17/2022 ). - check a hemoglobin A1c. -CBG 174 this morning.   -Continue Semglee 5 units nightly, SSI.   4.  Hyperlipidemia -Statin.    5.  History of CVA -CT head negative for any acute abnormalities.. -Continue home regimen of Plavix and statin for secondary stroke prophylaxis. -PT/OT.   6.  Hypertension -Patient noted to have elevated blood pressures in the 200s on presentation to the ED, noted to have received a dose of hydralazine 20 mg x 1. -Repeat blood pressure at time of interview with systolics in the 140s. -Patient noted to be on Lasix twice daily prior to admission which we will hold for now. -Resume home regimen Lasix and started on low-dose Cozaar 25 mg daily. -IV hydralazine as needed.   7.  Generalized anxiety disorder -Continue home regimen Ativan 0.5 mg Monday Wednesday Friday prior to HD as well as daily Ativan twice daily as needed which was started last night.      DVT prophylaxis: Thrombocytopenia, bilateral BKA Code Status: Full Family Communication: Updated patient.  No family at bedside. Disposition:   Status is: Inpatient The patient will require care spanning > 2 midnights and should be moved to inpatient because: Severity of illness   Consultants:  Nephrology: Dr. Juel Burrow 05/17/2022  Procedures:  CT head 05/16/2022 Chest x-ray 05/16/2022   Antimicrobials:  Anti-infectives (From admission,  onward)    Start     Dose/Rate Route Frequency Ordered Stop   05/18/22 1200  vancomycin (VANCOREADY) IVPB 750 mg/150 mL        750 mg 150 mL/hr over 60 Minutes Intravenous Every M-W-F (Hemodialysis) 05/16/22 1541     05/17/22 1600  ceFEPIme (MAXIPIME) 1 g in sodium chloride 0.9 % 100 mL IVPB        1 g 200 mL/hr over 30 Minutes Intravenous Every 24 hours 05/16/22 1543     05/16/22 1500  vancomycin (VANCOREADY) IVPB 1500 mg/300 mL        1,500 mg 150 mL/hr over 120 Minutes Intravenous  Once 05/16/22 1453 05/16/22 1959   05/16/22 1430  ceFEPIme (MAXIPIME) 2 g in sodium chloride 0.9 % 100 mL IVPB        2 g 200 mL/hr over 30 Minutes Intravenous  Once 05/16/22 1429 05/16/22 1528   05/16/22 1430  metroNIDAZOLE (FLAGYL) IVPB 500 mg        500 mg 100 mL/hr over 60 Minutes Intravenous  Once 05/16/22 1429 05/16/22 1851   05/16/22 1430  vancomycin (VANCOCIN) IVPB 1000 mg/200 mL premix  Status:  Discontinued        1,000 mg 200 mL/hr over 60 Minutes Intravenous  Once 05/16/22 1429 05/16/22 1453         Subjective: Laying in bed sleeping but easily arousable.  Alert and oriented to self place and time.  Overall feels well.  Denies any chest pain or shortness of breath.  No abdominal pain.   Objective: Vitals:   05/16/22 2248 05/16/22 2330 05/17/22 0452 05/17/22 0749  BP:  (!) 173/78 (!) 148/68 (!) 143/69  Pulse: 64  60 (!) 57  Resp: 18  15 18   Temp: 98 F (36.7 C)  97.8 F (36.6 C) 98.3 F (36.8 C)  TempSrc: Oral  Oral Oral  SpO2: 99%  95% 97%    Intake/Output Summary (Last 24 hours) at 05/17/2022 1209 Last data filed at 05/17/2022 1610 Gross per 24 hour  Intake 520 ml  Output --  Net 520 ml   There were no vitals filed for this visit.  Examination:  General exam: Appears calm and comfortable  Respiratory system: Clear to auscultation.  No wheezes, no crackles, no rhonchi.  Fair air movement.  Speaking in full sentences.  Respiratory effort normal. Cardiovascular system: S1 &  S2 heard, RRR. No JVD, murmurs, rubs, gallops or clicks. No pedal edema. Gastrointestinal system: Abdomen is nondistended, soft and nontender. No organomegaly or masses felt. Normal bowel sounds heard. Central nervous system: Alert and oriented. No focal neurological deficits. Extremities: Symmetric 5 x 5 power. Skin: No rashes, lesions or ulcers Psychiatry: Judgement and insight appear normal. Mood & affect appropriate.     Data Reviewed: I have personally reviewed following labs and imaging studies  CBC: Recent Labs  Lab 05/16/22 1419 05/16/22 1435 05/17/22 0801  WBC 12.0*  --  8.9  HGB 10.6* 12.2* 10.0*  HCT 33.8* 36.0* 32.2*  MCV 92.3  --  92.5  PLT 54*  --  80*    Basic Metabolic Panel: Recent Labs  Lab 05/16/22 1419 05/16/22 1435 05/17/22 0801  NA 136 136 135  K 4.1 4.1 4.1  CL 97* 101 100  CO2 25  --  25  GLUCOSE 147* 147* 169*  BUN 33* 35* 43*  CREATININE 5.17* 6.00* 6.28*  CALCIUM 8.6*  --  8.6*  PHOS  --   --  4.9*    GFR: CrCl cannot be calculated (Unknown ideal weight.).  Liver Function Tests: Recent Labs  Lab 05/16/22 1419 05/17/22 0801  AST 32  --   ALT 22  --   ALKPHOS 109  --   BILITOT 0.7  --   PROT 7.0  --   ALBUMIN 3.5 3.0*    CBG: Recent Labs  Lab 05/16/22 2301 05/17/22 0739 05/17/22 1149  GLUCAP 175* 174* 136*     Recent Results (from the past 240 hour(s))  Blood Culture (routine x 2)     Status: None (Preliminary result)   Collection Time: 05/16/22  2:29 PM   Specimen: BLOOD LEFT FOREARM  Result Value Ref Range Status   Specimen Description BLOOD LEFT FOREARM  Final   Special Requests   Final    BOTTLES DRAWN AEROBIC AND ANAEROBIC Blood Culture results may not be optimal due to an excessive volume of blood received in culture bottles   Culture   Final    NO GROWTH < 24 HOURS Performed at [facility], [address] [place], [place] [address]    Report Status PENDING  Incomplete  Resp panel by RT-PCR (RSV,  Flu A&B, Covid) Anterior Nasal Swab     Status: None   Collection Time: 05/16/22  3:06 PM   Specimen: Anterior Nasal Swab  Result Value Ref Range Status   SARS Coronavirus 2 by RT PCR NEGATIVE NEGATIVE Final   Influenza A by PCR NEGATIVE NEGATIVE Final   Influenza B by PCR NEGATIVE NEGATIVE Final    Comment: (NOTE) The Xpert Xpress SARS-CoV-2/FLU/RSV plus assay is intended as an aid in the diagnosis of influenza from Nasopharyngeal swab specimens and should not be used as a sole basis for treatment. Nasal washings and aspirates are unacceptable for Xpert Xpress SARS-CoV-2/FLU/RSV testing.  Fact Sheet for Patients: BloggerCourse.com  Fact Sheet for Healthcare Providers: SeriousBroker.it  This test is not yet approved or cleared by the [place] FDA and has been authorized for detection and/or diagnosis of SARS-CoV-2 by FDA under an Emergency Use Authorization (EUA). This EUA will remain in effect (meaning this test can be used) for the duration of the COVID-19 declaration under Section 564(b)(1) of the Act, 21 U.S.C. section 360bbb-3(b)(1), unless the authorization is terminated or revoked.     Resp Syncytial Virus by PCR NEGATIVE NEGATIVE Final    Comment: (NOTE) Fact Sheet for Patients: BloggerCourse.com  Fact Sheet for Healthcare Providers: SeriousBroker.it  This test is not yet approved or cleared by the [place] FDA and has been authorized for detection and/or diagnosis of SARS-CoV-2 by FDA under an Emergency Use Authorization (EUA). This EUA will remain in effect (meaning this test can be used) for the duration of the COVID-19 declaration under Section 564(b)(1) of the Act, 21 U.S.C. section 360bbb-3(b)(1), unless the authorization is terminated or revoked.  Performed at [facility], [address], [place], [place] [address]   Blood Culture (routine x  2)     Status: None (Preliminary result)   Collection Time: 05/16/22  4:15 PM   Specimen: BLOOD  Result Value Ref Range Status   Specimen Description BLOOD LEFT ANTECUBITAL  Final   Special Requests   Final    BOTTLES DRAWN AEROBIC ONLY Blood Culture adequate volume   Culture   Final    NO GROWTH < 24 HOURS  Performed at [facility], [address] [place], [place] [address]    Report Status PENDING  Incomplete  MRSA Next Gen by PCR, Nasal     Status: Abnormal   Collection Time: 05/16/22 11:27 PM   Specimen: Nasal Mucosa; Nasal Swab  Result Value Ref Range Status   MRSA by PCR Next Gen DETECTED (A) NOT DETECTED Final    Comment: RESULTS CALLED TO READ BACK BY AND VERIFIED WITH RN T.DRAME ON 05/17/22 AT 0153 BY NM (NOTE) The GeneXpert MRSA Assay (FDA approved for NASAL specimens only), is one component of a comprehensive MRSA colonization surveillance program. It is not intended to diagnose MRSA infection nor to guide or monitor treatment for MRSA infections. Test performance is not FDA approved in patients less than 13 years old. Performed at [facility], [address], [place], [place] [address]          Radiology Studies: CT Head Wo Contrast  Result Date: 05/16/2022 CLINICAL DATA:  Lethargy and altered mental status EXAM: CT HEAD WITHOUT CONTRAST TECHNIQUE: Contiguous axial images were obtained from the base of the skull through the vertex without intravenous contrast. RADIATION DOSE REDUCTION: This exam was performed according to the departmental dose-optimization program which includes automated exposure control, adjustment of the mA and/or kV according to patient size and/or use of iterative reconstruction technique. COMPARISON:  07/18/2020 FINDINGS: Brain: No evidence of acute infarction, hemorrhage, hydrocephalus, extra-axial collection or mass lesion/mass effect. Mild chronic white matter ischemic changes and atrophic changes are seen. Prior left frontal  infarct is again identified and stable. Vascular: No hyperdense vessel or unexpected calcification. Skull: Normal. Negative for fracture or focal lesion. Sinuses/Orbits: No acute finding. Other: None. IMPRESSION: Chronic atrophic and ischemic changes without acute abnormality. Electronically Signed   By: Alcide Clever M.D.   On: 05/16/2022 19:39   DG Chest Portable 1 View  Result Date: 05/16/2022 CLINICAL DATA:  Provided history: Shortness of breath. Additional history provided: patient from dialysis center, lethargy, altered mental status. EXAM: PORTABLE CHEST 1 VIEW COMPARISON:  Prior chest radiographs 05/08/2021 and earlier. FINDINGS: Cardiomegaly. Aortic atherosclerosis. Prominence of the interstitial lung markings bilaterally. No appreciable airspace consolidation. No evidence of pleural effusion or pneumothorax. No acute osseous abnormality identified. Chronic, healed left rib fracture deformities. IMPRESSION: 1. Cardiomegaly. 2. Prominence of the interstitial lung markings bilaterally. This may reflect interstitial edema or atypical/viral infection. 3. Aortic Atherosclerosis (ICD10-I70.0). Electronically Signed   By: Jackey Loge D.O.   On: 05/16/2022 15:50        Scheduled Meds:  atorvastatin  40 mg Oral Daily   Chlorhexidine Gluconate Cloth  6 each Topical Q0600   clopidogrel  75 mg Oral Daily   insulin aspart  0-6 Units Subcutaneous TID WC   insulin glargine-yfgn  5 Units Subcutaneous QHS   [START ON 05/18/2022] LORazepam  0.5 mg Oral Q M,W,F-HD   melatonin  10 mg Oral QHS   multivitamin  1 tablet Oral q AM   multivitamin with minerals  1 tablet Oral QHS   nicotine  7 mg Transdermal Daily   [START ON 05/18/2022] pregabalin  25 mg Oral Once per day on Mon Wed Fri   Ensure Max Protein  240 mL Oral q morning   sevelamer carbonate  1,600 mg Oral 2 times per day   sevelamer carbonate  3,200 mg Oral 3 times per day   sodium chloride flush  3 mL Intravenous Q12H   sodium chloride flush  3  mL  Intravenous Q12H   [START ON 05/19/2022] Vitamin D (Ergocalciferol)  50,000 Units Oral Q Sat   Continuous Infusions:  sodium chloride     ceFEPime (MAXIPIME) IV     [START ON 05/18/2022] vancomycin       LOS: 0 days    Time spent: 35 minutes    Ramiro Harvest, MD [facility]   To contact the attending provider between 7A-7P or the covering provider during after hours 7P-7A, please log into the web site [email or web address] and access using universal [place] password for that web site. If you do not have the password, please call the hospital operator.  05/17/2022, 12:09 PM
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
## 2022-05-17 NOTE — Progress Notes (Signed)
PT Cancellation Note  Patient Details Name: ROCIO ROME MRN: [id number] DOB: March 17, 1970   Cancelled Treatment:    Reason Eval/Treat Not Completed: PT screened, no needs identified, will sign off - per OT pt mod I and plans to d/c back to SNF. PT to sign off, please reconsult as needed.   Marye Round, PT DPT Acute Rehabilitation Services Secure Chat Preferred  Office [phone]    Truddie Coco 05/17/2022, 9:28 AM

## 2022-05-17 NOTE — Evaluation (Signed)
Occupational Therapy Evaluation Patient Details Name: Jorge [name] MRN: [id number] DOB: August 12, 1970 Today's Date: 05/17/2022   History of Present Illness Jorge [name] is a 52 y.o. male who presented from HD for AMS. Admitted for acute metabolic encephalopathy, chest x-ray done with cardiomegaly concern for PNA. PMHx: end-stage renal disease on hemodialysis (MWF) x 5 years per patient, history of CVA x 3 per patient, bilateral BKA, PAD, secondary hyperparathyroidism, diabetes mellitus   Clinical Impression   Jorge [name] was evaluated s/p the above admission list. He resides at [facility] but is mod I at baseline. Upon evaluation he was mildly limited by general malaise and lack of sleep. Overall he demonstrated mod I ability to complete transfers, mobility with bilat prosthetics and RW and ADLs. Pt is at his functional baseline and does not need continued OT services.        Recommendations for follow up therapy are one component of a multi-disciplinary discharge planning process, led by the attending physician.  Recommendations may be updated based on patient status, additional functional criteria and insurance authorization.   Assistance Recommended at Discharge PRN  Patient can return home with the following Assist for transportation    Functional Status Assessment  Patient has not had a recent decline in their functional status  Equipment Recommendations  None recommended by OT       Precautions / Restrictions Precautions Precautions: Fall Precaution Comments: bilat BKAs Restrictions Weight Bearing Restrictions: No      Mobility Bed Mobility Overal bed mobility: Independent            Transfers Overall transfer level: Needs assistance Equipment used: Rolling walker (2 wheels) Transfers: Sit to/from Stand Sit to Stand: Modified independent (Device/Increase time)           General transfer comment: mildly unsafe transfer/RW use, likely baseline      Balance Overall  balance assessment: Mild deficits observed, not formally tested                 ADL either performed or assessed with clinical judgement   ADL Overall ADL's : Modified independent;At baseline                                       General ADL Comments: mod I for BADLs and functional mobility with bilat prosthetics and RW.     Vision Ability to See in Adequate Light: 1 Impaired Patient Visual Report: No change from baseline Vision Assessment?: Vision impaired- to be further tested in functional context Additional Comments: reports low vision in R eye     Perception Perception Perception Tested?: No   Praxis Praxis Praxis tested?: Within functional limits    Pertinent Vitals/Pain Pain Assessment Pain Assessment: Faces Faces Pain Scale: No hurt Pain Intervention(s): Monitored during session        Extremity/Trunk Assessment Upper Extremity Assessment Upper Extremity Assessment: Overall WFL for tasks assessed   Lower Extremity Assessment Lower Extremity Assessment: Overall WFL for tasks assessed (bilat BKAs)   Cervical / Trunk Assessment Cervical / Trunk Assessment: Normal   Communication Communication Communication: No difficulties   Cognition Arousal/Alertness: Awake/alert Behavior During Therapy: WFL for tasks assessed/performed Overall Cognitive Status: Within Functional Limits for tasks assessed                                 General Comments:  overall WFL for simple tasks assessed. appropriately discussing med mgmt with RN     General Comments  VSS on RA, asked RN for RUE restricted ex band     Home Living Family/patient expects to be discharged to:: Skilled nursing facility     Prior Functioning/Environment Prior Level of Function : Independent/Modified Independent             Mobility Comments: mobilizes in [facility] mostly, able to walk with bilat prosthetics & RW too. Reports 1 fall due to WC breaks not being  locked ADLs Comments: Mod I        OT Problem List: Decreased safety awareness         OT Goals(Current goals can be found in the care plan section) Acute Rehab OT Goals Patient Stated Goal: to get some sleep OT Goal Formulation: With patient Time For Goal Achievement: 05/17/22 Potential to Achieve Goals: Good   AM-PAC OT "6 Clicks" Daily Activity     Outcome Measure Help from another person eating meals?: None Help from another person taking care of personal grooming?: None Help from another person toileting, which includes using toliet, bedpan, or urinal?: None Help from another person bathing (including washing, rinsing, drying)?: None Help from another person to put on and taking off regular upper body clothing?: None Help from another person to put on and taking off regular lower body clothing?: None 6 Click Score: 24   End of Session Equipment Utilized During Treatment: Rolling walker (2 wheels) Nurse Communication: Mobility status  Activity Tolerance: Patient tolerated treatment well Patient left: in bed;with call bell/phone within reach  OT Visit Diagnosis: Other abnormalities of gait and mobility (R26.89)                Time: 7829-5621 OT Time Calculation (min): 17 min Charges:  OT General Charges $OT Visit: 1 Visit OT Evaluation $OT Eval Low Complexity: 1 Low  Derenda Mis, OTR/L Acute Rehabilitation Services Office [phone] Secure Chat Communication Preferred   Donia Pounds 05/17/2022, 9:06 AM

## 2022-05-18 DIAGNOSIS — E1122 Type 2 diabetes mellitus with diabetic chronic kidney disease: Secondary | ICD-10-CM | POA: Diagnosis not present

## 2022-05-18 DIAGNOSIS — G9341 Metabolic encephalopathy: Secondary | ICD-10-CM | POA: Diagnosis not present

## 2022-05-18 DIAGNOSIS — D638 Anemia in other chronic diseases classified elsewhere: Secondary | ICD-10-CM | POA: Diagnosis not present

## 2022-05-18 DIAGNOSIS — N186 End stage renal disease: Secondary | ICD-10-CM | POA: Diagnosis not present

## 2022-05-18 LAB — CBC
HCT: 32 % — ABNORMAL LOW (ref 39.0–52.0)
HCT: 31.6 % — ABNORMAL LOW (ref 39.0–52.0)
Hemoglobin: 10.1 g/dL — ABNORMAL LOW (ref 13.0–17.0)
Hemoglobin: 9.8 g/dL — ABNORMAL LOW (ref 13.0–17.0)
MCH: 28.9 pg (ref 26.0–34.0)
MCH: 28.5 pg (ref 26.0–34.0)
MCHC: 31.6 g/dL (ref 30.0–36.0)
MCHC: 31 g/dL (ref 30.0–36.0)
MCV: 91.4 fL (ref 80.0–100.0)
MCV: 91.9 fL (ref 80.0–100.0)
Platelets: 80 10*3/uL — ABNORMAL LOW (ref 150–400)
Platelets: 88 10*3/uL — ABNORMAL LOW (ref 150–400)
RBC: 3.5 MIL/uL — ABNORMAL LOW (ref 4.22–5.81)
RBC: 3.44 MIL/uL — ABNORMAL LOW (ref 4.22–5.81)
RDW: 19.1 % — ABNORMAL HIGH (ref 11.5–15.5)
RDW: 19.2 % — ABNORMAL HIGH (ref 11.5–15.5)
WBC: 6.2 10*3/uL (ref 4.0–10.5)
WBC: 5.3 10*3/uL (ref 4.0–10.5)
nRBC: 0 % (ref 0.0–0.2)
nRBC: 0 % (ref 0.0–0.2)

## 2022-05-18 LAB — RENAL FUNCTION PANEL
Albumin: 2.9 g/dL — ABNORMAL LOW (ref 3.5–5.0)
Anion gap: 15 (ref 5–15)
BUN: 57 mg/dL — ABNORMAL HIGH (ref 6–20)
CO2: 22 mmol/L (ref 22–32)
Calcium: 8.6 mg/dL — ABNORMAL LOW (ref 8.9–10.3)
Chloride: 98 mmol/L (ref 98–111)
Creatinine, Ser: 7.9 mg/dL — ABNORMAL HIGH (ref 0.61–1.24)
GFR, Estimated: 8 mL/min — ABNORMAL LOW (ref >60)
Glucose, Bld: 100 mg/dL — ABNORMAL HIGH (ref 70–99)
Phosphorus: 5.3 mg/dL — ABNORMAL HIGH (ref 2.5–4.6)
Potassium: 4.4 mmol/L (ref 3.5–5.1)
Sodium: 135 mmol/L (ref 135–145)

## 2022-05-18 LAB — CULTURE, BLOOD (ROUTINE X 2)

## 2022-05-18 LAB — GLUCOSE, CAPILLARY
Glucose-Capillary: 135 mg/dL — ABNORMAL HIGH (ref 70–99)
Glucose-Capillary: 173 mg/dL — ABNORMAL HIGH (ref 70–99)
Glucose-Capillary: 166 mg/dL — ABNORMAL HIGH (ref 70–99)

## 2022-05-18 LAB — HEPATITIS B SURFACE ANTIBODY, QUANTITATIVE: Hep B S AB Quant (Post): 12.1 m[IU]/mL (ref >9.9)

## 2022-05-18 MED ORDER — LIDOCAINE HCL (PF) 1 % IJ SOLN: 5.0000 mL | INTRAMUSCULAR | Status: DC | PRN

## 2022-05-18 MED ORDER — PENTAFLUOROPROP-TETRAFLUOROETH EX AERO: 1.0000 | INHALATION_SPRAY | CUTANEOUS | Status: DC | PRN

## 2022-05-18 MED ORDER — PREGABALIN 25 MG PO CAPS
25.0000 mg | ORAL_CAPSULE | Freq: Every day | ORAL | Status: DC
  Administered 2022-05-19: 25 mg via ORAL
  Filled 2022-05-18: qty 1

## 2022-05-18 MED ORDER — LIDOCAINE-PRILOCAINE 2.5-2.5 % EX CREA: 1.0000 | TOPICAL_CREAM | CUTANEOUS | Status: DC | PRN

## 2022-05-18 MED ORDER — MUPIROCIN 2 % EX OINT
TOPICAL_OINTMENT | Freq: Two times a day (BID) | CUTANEOUS | Status: DC
  Filled 2022-05-18: qty 22

## 2022-05-18 NOTE — Progress Notes (Signed)
PROGRESS NOTE    [name]  ZOX:[id number] DOB: Jun 05, 1970 DOA: 05/16/2022 PCP: Jorge [name], No Pcp Per   No chief complaint on file.   Brief Narrative:  Jorge [name] 52 year old gentleman history of ESRD on HD Monday Wednesday Friday, history of CVA, bilateral BKA, PAD, secondary hyperparathyroidism, diabetes presented from hemodialysis center with altered mental status.  Jorge [name] noted to have a low-grade fever, slight leukocytosis and Jorge [name] admitted for further evaluation with concern for possible infectious etiology.  Jorge [name] placed empirically on IV antibiotics.  Jorge [name] improved clinically.  Nephrology consulted.   Assessment & Plan:   Principal Problem:   Acute metabolic encephalopathy Active Problems:   ESRD (end stage renal disease) on dialysis ([name])   Type 2 diabetes mellitus with chronic kidney disease on chronic dialysis, with long-term current use of insulin ([name])   Hypertension associated with diabetes ([name])   S/P BKA (below knee amputation) bilateral ([name])   Anemia of chronic disease   Gastroparesis due to DM ([name])   Secondary hyperparathyroidism of renal origin ([name])   Hyperlipidemia associated with type 2 diabetes mellitus ([name])  #1 acute metabolic encephalopathy -Jorge [name] presented from hemodialysis center due to lethargy, increased somnolence.  Noted to have a temp of 100.7 on presentation to the ED, leukocytosis white count of 12, had some complaints of shortness of breath.  Elevated lactic acid level of 2.0 with repeat at 1.8. -Chest x-ray done with cardiomegaly, prominence of interstitial lung markings bilaterally may reflect interstitial edema or atypical viral infection, aortic atherosclerosis. -CT head done with no acute abnormalities. -EDP concern for possible pneumonia. -Urine Legionella antigen pending, urine pneumococcus antigen pending. -Blood cultures ordered and pending with no growth to date x 2 days. -Jorge [name] improved clinically, likely back at  baseline, afebrile. -Continue empiric IV vancomycin, IV cefepime pending culture results. -If blood cultures remain negative tomorrow could likely transition to oral antibiotics to complete an empiric course of 5 to 7 days. -Avoid sedative medications.   2.  End-stage renal disease on HD Monday Wednesday Friday -Jorge [name] presented from hemodialysis with altered mental status.  Jorge [name] also with some shortness of breath on presentation.. -Dialysis was not fully completed Jorge [name] may still have a little bit of volume issues however sats of 95% on room air. -Jorge [name] seen in consultation by nephrology and Jorge [name] in hemodialysis today.   -Per nephrology.     3.  Diabetes mellitus type 2 -Hemoglobin A1c 6.2 (05/17/2022 ). -Repeat hemoglobin A1c at 6.2 (05/17/2022 ). -check a hemoglobin A1c. -CBG 135 this morning.   -Continue Semglee 5 units nightly, SSI.   4.  Hyperlipidemia -Continue statin.    5.  History of CVA -CT head negative for any acute abnormalities.. -Continue home regimen of Plavix and statin for secondary stroke prophylaxis. -PT/OT.   6.  Hypertension -Jorge [name] noted to have elevated blood pressures in the 200s on presentation to the ED, noted to have received a dose of hydralazine 20 mg x 1. -Repeat blood pressure at time of admission interview with systolics in the 140s. -Jorge [name] noted to be on Lasix twice daily prior to admission which we will hold for now. -Home regimen Lasix resumed as well as low-dose Cozaar 25 mg daily. -IV hydralazine as needed.   7.  Generalized anxiety disorder -Currently stable.   -Continue home regimen Ativan 0.5 mg Monday Wednesday Friday prior to HD as well as daily Ativan twice daily as needed.     DVT prophylaxis: Thrombocytopenia, bilateral BKA Code Status: Full Family Communication: Updated  Jorge [name].  No family at bedside. Disposition: Back to skilled nursing facility in the next 24 to 48 hours.  Status is: Inpatient The Jorge [name] will  require care spanning > 2 midnights and should be moved to inpatient because: Severity of illness   Consultants:  Nephrology: Dr. Juel Burrow 05/17/2022  Procedures:  CT head 05/16/2022 Chest x-ray 05/16/2022   Antimicrobials:  Anti-infectives (From admission, onward)    Start     Dose/Rate Route Frequency Ordered Stop   05/18/22 1200  vancomycin (VANCOREADY) IVPB 750 mg/150 mL        750 mg 150 mL/hr over 60 Minutes Intravenous Every M-W-F (Hemodialysis) 05/16/22 1541     05/17/22 1600  ceFEPIme (MAXIPIME) 1 g in sodium chloride 0.9 % 100 mL IVPB        1 g 200 mL/hr over 30 Minutes Intravenous Every 24 hours 05/16/22 1543     05/16/22 1500  vancomycin (VANCOREADY) IVPB 1500 mg/300 mL        1,500 mg 150 mL/hr over 120 Minutes Intravenous  Once 05/16/22 1453 05/16/22 1959   05/16/22 1430  ceFEPIme (MAXIPIME) 2 g in sodium chloride 0.9 % 100 mL IVPB        2 g 200 mL/hr over 30 Minutes Intravenous  Once 05/16/22 1429 05/16/22 1528   05/16/22 1430  metroNIDAZOLE (FLAGYL) IVPB 500 mg        500 mg 100 mL/hr over 60 Minutes Intravenous  Once 05/16/22 1429 05/16/22 1851   05/16/22 1430  vancomycin (VANCOCIN) IVPB 1000 mg/200 mL premix  Status:  Discontinued        1,000 mg 200 mL/hr over 60 Minutes Intravenous  Once 05/16/22 1429 05/16/22 1453         Subjective: In hemodialysis.  Eating an apple.  No chest pain.  No shortness of breath.  No abdominal pain.  Alert and oriented to self place and time.    Objective: Vitals:   05/18/22 1030 05/18/22 1100 05/18/22 1130 05/18/22 1200  BP: (!) 174/79 (!) 161/78 (!) 170/79 (!) 164/68  Pulse: (!) 57 (!) 56 (!) 57 (!) 103  Resp: 16 18    Temp:      TempSrc:      SpO2:  100% 100%   Weight:        Intake/Output Summary (Last 24 hours) at 05/18/2022 1222 Last data filed at 05/17/2022 1627 Gross per 24 hour  Intake 100 ml  Output --  Net 100 ml    Filed Weights   05/18/22 0500 05/18/22 0845  Weight: 67.5 kg 68.9 kg     Examination:  General exam: NAD.  In HD. Respiratory system: Lungs clear to auscultation bilaterally.  No wheezes, no crackles, no rhonchi.  Fair air movement.  Speaking in full sentences.  Cardiovascular system: RRR no murmurs rubs or gallops.  No JVD.  No lower extremity edema.  Gastrointestinal system: Abdomen is soft, nontender, nondistended, positive bowel sounds.  No rebound.  No guarding.  Central nervous system: Alert and oriented. No focal neurological deficits. Extremities: Symmetric 5 x 5 power. Skin: No rashes, lesions or ulcers Psychiatry: Judgement and insight appear normal. Mood & affect appropriate.     Data Reviewed: I have personally reviewed following labs and imaging studies  CBC: Recent Labs  Lab 05/16/22 1419 05/16/22 1435 05/17/22 0801 05/18/22 0702 05/18/22 0904  WBC 12.0*  --  8.9 6.2 5.3  HGB 10.6* 12.2* 10.0* 10.1* 9.8*  HCT 33.8* 36.0* 32.2* 32.0* 31.6*  MCV  92.3  --  92.5 91.4 91.9  PLT 54*  --  80* 80* 88*     Basic Metabolic Panel: Recent Labs  Lab 05/16/22 1419 05/16/22 1435 05/17/22 0801 05/18/22 0702  NA 136 136 135 135  K 4.1 4.1 4.1 4.4  CL 97* 101 100 98  CO2 25  --  25 22  GLUCOSE 147* 147* 169* 100*  BUN 33* 35* 43* 57*  CREATININE 5.17* 6.00* 6.28* 7.90*  CALCIUM 8.6*  --  8.6* 8.6*  PHOS  --   --  4.9* 5.3*     GFR: Estimated Creatinine Clearance: 10.5 mL/min (A) (by C-G formula based on SCr of 7.9 mg/dL (H)).  Liver Function Tests: Recent Labs  Lab 05/16/22 1419 05/17/22 0801 05/18/22 0702  AST 32  --   --   ALT 22  --   --   ALKPHOS 109  --   --   BILITOT 0.7  --   --   PROT 7.0  --   --   ALBUMIN 3.5 3.0* 2.9*     CBG: Recent Labs  Lab 05/17/22 0739 05/17/22 1149 05/17/22 1646 05/17/22 1722 05/17/22 2055  GLUCAP 174* 136* 170* 177* 220*      Recent Results (from the past 240 hour(s))  Blood Culture (routine x 2)     Status: None (Preliminary result)   Collection Time: 05/16/22  2:29 PM    Specimen: BLOOD LEFT FOREARM  Result Value Ref Range Status   Specimen Description BLOOD LEFT FOREARM  Final   Special Requests   Final    BOTTLES DRAWN AEROBIC AND ANAEROBIC Blood Culture results may not be optimal due to an excessive volume of blood received in culture bottles   Culture   Final    NO GROWTH 2 DAYS Performed at [facility], [address] [place], [place] [address]    Report Status PENDING  Incomplete  Resp panel by RT-PCR (RSV, Flu A&B, Covid) Anterior Nasal Swab     Status: None   Collection Time: 05/16/22  3:06 PM   Specimen: Anterior Nasal Swab  Result Value Ref Range Status   SARS Coronavirus 2 by RT PCR NEGATIVE NEGATIVE Final   Influenza A by PCR NEGATIVE NEGATIVE Final   Influenza B by PCR NEGATIVE NEGATIVE Final    Comment: (NOTE) The Xpert Xpress SARS-CoV-2/FLU/RSV plus assay is intended as an aid in the diagnosis of influenza from Nasopharyngeal swab specimens and should not be used as a sole basis for treatment. Nasal washings and aspirates are unacceptable for Xpert Xpress SARS-CoV-2/FLU/RSV testing.  Fact Sheet for Patients: BloggerCourse.com  Fact Sheet for Healthcare Providers: SeriousBroker.it  This test is not yet approved or cleared by the [place] FDA and has been authorized for detection and/or diagnosis of SARS-CoV-2 by FDA under an Emergency Use Authorization (EUA). This EUA will remain in effect (meaning this test can be used) for the duration of the COVID-19 declaration under Section 564(b)(1) of the Act, 21 U.S.C. section 360bbb-3(b)(1), unless the authorization is terminated or revoked.     Resp Syncytial Virus by PCR NEGATIVE NEGATIVE Final    Comment: (NOTE) Fact Sheet for Patients: BloggerCourse.com  Fact Sheet for Healthcare Providers: SeriousBroker.it  This test is not yet approved or cleared by the [place] FDA and has been authorized for detection and/or diagnosis of SARS-CoV-2 by FDA under an Emergency Use Authorization (EUA). This EUA will remain in effect (meaning this test can be used)  for the duration of the COVID-19 declaration under Section 564(b)(1) of the Act, 21 U.S.C. section 360bbb-3(b)(1), unless the authorization is terminated or revoked.  Performed at [facility], [address] [place], [place] [address]   Blood Culture (routine x 2)     Status: None (Preliminary result)   Collection Time: 05/16/22  4:15 PM   Specimen: BLOOD  Result Value Ref Range Status   Specimen Description BLOOD LEFT ANTECUBITAL  Final   Special Requests   Final    BOTTLES DRAWN AEROBIC ONLY Blood Culture adequate volume   Culture   Final    NO GROWTH 2 DAYS Performed at [facility], [address], [place], [place] [address]    Report Status PENDING  Incomplete  MRSA Next Gen by PCR, Nasal     Status: Abnormal   Collection Time: 05/16/22 11:27 PM   Specimen: Nasal Mucosa; Nasal Swab  Result Value Ref Range Status   MRSA by PCR Next Gen DETECTED (A) NOT DETECTED Final    Comment: RESULTS CALLED TO READ BACK BY AND VERIFIED WITH RN T.DRAME ON 05/17/22 AT 0153 BY NM (NOTE) The GeneXpert MRSA Assay (FDA approved for NASAL specimens only), is one component of a comprehensive MRSA colonization surveillance program. It is not intended to diagnose MRSA infection nor to guide or monitor treatment for MRSA infections. Test performance is not FDA approved in patients less than 34 years old. Performed at [facility], [address], [place], [place] [address]          Radiology Studies: CT Head Wo Contrast  Result Date: 05/16/2022 CLINICAL DATA:  Lethargy and altered mental status EXAM: CT HEAD WITHOUT CONTRAST TECHNIQUE: Contiguous axial images were obtained from the base of the skull through the vertex without intravenous contrast. RADIATION DOSE REDUCTION: This exam  was performed according to the departmental dose-optimization program which includes automated exposure control, adjustment of the mA and/or kV according to Jorge [name] size and/or use of iterative reconstruction technique. COMPARISON:  07/18/2020 FINDINGS: Brain: No evidence of acute infarction, hemorrhage, hydrocephalus, extra-axial collection or mass lesion/mass effect. Mild chronic white matter ischemic changes and atrophic changes are seen. Prior left frontal infarct is again identified and stable. Vascular: No hyperdense vessel or unexpected calcification. Skull: Normal. Negative for fracture or focal lesion. Sinuses/Orbits: No acute finding. Other: None. IMPRESSION: Chronic atrophic and ischemic changes without acute abnormality. Electronically Signed   By: Alcide Clever M.D.   On: 05/16/2022 19:39   DG Chest Portable 1 View  Result Date: 05/16/2022 CLINICAL DATA:  Provided history: Shortness of breath. Additional history provided: Jorge [name] from dialysis center, lethargy, altered mental status. EXAM: PORTABLE CHEST 1 VIEW COMPARISON:  Prior chest radiographs 05/08/2021 and earlier. FINDINGS: Cardiomegaly. Aortic atherosclerosis. Prominence of the interstitial lung markings bilaterally. No appreciable airspace consolidation. No evidence of pleural effusion or pneumothorax. No acute osseous abnormality identified. Chronic, healed left rib fracture deformities. IMPRESSION: 1. Cardiomegaly. 2. Prominence of the interstitial lung markings bilaterally. This may reflect interstitial edema or atypical/viral infection. 3. Aortic Atherosclerosis (ICD10-I70.0). Electronically Signed   By: Jackey Loge D.O.   On: 05/16/2022 15:50        Scheduled Meds:  atorvastatin  40 mg Oral Daily   Chlorhexidine Gluconate Cloth  6 each Topical Q0600   clopidogrel  75 mg Oral Daily   furosemide  20 mg Oral BID   insulin aspart  0-6 Units Subcutaneous TID WC   insulin glargine-yfgn  5  Units Subcutaneous QHS   LORazepam  0.5  mg Oral Q M,W,F-HD   losartan  25 mg Oral Daily   melatonin  10 mg Oral QHS   multivitamin  1 tablet Oral q AM   multivitamin with minerals  1 tablet Oral QHS   mupirocin ointment   Nasal BID   nicotine  7 mg Transdermal Daily   pregabalin  25 mg Oral Once per day on Mon Wed Fri   [START ON 05/19/2022] pregabalin  25 mg Oral Daily   Ensure Max Protein  240 mL Oral q morning   sevelamer carbonate  1,600 mg Oral 2 times per day   sevelamer carbonate  3,200 mg Oral 3 times per day   sodium chloride flush  3 mL Intravenous Q12H   sodium chloride flush  3 mL Intravenous Q12H   [START ON 05/19/2022] Vitamin D (Ergocalciferol)  50,000 Units Oral Q Sat   Continuous Infusions:  sodium chloride     ceFEPime (MAXIPIME) IV 1 g (05/17/22 1555)   vancomycin 750 mg (05/18/22 1152)     LOS: 1 day    Time spent: 35 minutes    Ramiro Harvest, MD [facility]   To contact the attending provider between 7A-7P or the covering provider during after hours 7P-7A, please log into the web site [email or web address] and access using universal [place] password for that web site. If you do not have the password, please call the hospital operator.  05/18/2022, 12:22 PM

## 2022-05-18 NOTE — Plan of Care (Signed)

## 2022-05-18 NOTE — Procedures (Signed)
HD Note:  Some information was entered later than the data was gathered due to patient care needs. The stated time with the data is accurate.  Received patient in bed to unit.  Alert and oriented.  Informed consent signed and in chart.   TX duration: 3.5 hours  Patient tolerated well. Patient rested with eyes closed until the last hour. Patient pleasant and cooperative  Transported back to the room  Alert, without acute distress.  Hand-off given to patient's nurse.   Access used: Right upper arm fistula Access issues: none  Total UF removed: 3000 ml Medication(s) given: per order, see MAR    [facility]

## 2022-05-18 NOTE — TOC Initial Note (Signed)
Transition of Care [facility]) - Initial/Assessment Note    Patient Details  Name: Jorge [name] MRN: [id number] Date of Birth: 1970-03-06  Transition of [facility]) CM/SW Contact:    Luba Matzen A [place], Theresia Majors Phone Number: 05/18/2022, 3:14 PM  Clinical Narrative:                 TOC contacted pt via phone. He said that he plans to go back to [facility] where he came from.   He said that he wants to "find his own place" long term but has been unsuccessful so far.   CSW will provide resources for housing in [place]. No other TOC needs identified.   TOC will continue to follow.   Expected Discharge Plan: Skilled Nursing Facility Barriers to Discharge: No Barriers Identified   Patient Goals and CMS Choice Patient states their goals for this hospitalization and ongoing recovery are:: get better          Expected Discharge Plan and Services In-house Referral: Clinical Social Work                                            Prior Living Arrangements/Services   Lives with:: Facility Resident          Need for Family Participation in Patient Care: No (Comment) Care giver support system in place?: No (comment)   Criminal Activity/Legal Involvement Pertinent to Current Situation/Hospitalization: No - Comment as needed  Activities of Daily Living Home Assistive Devices/Equipment: Prosthesis, Oxygen, Wheelchair ADL Screening (condition at time of admission) Patient's cognitive ability adequate to safely complete daily activities?: Yes Is the patient deaf or have difficulty hearing?: No Does the patient have difficulty seeing, even when wearing glasses/contacts?: No Does the patient have difficulty concentrating, remembering, or making decisions?: No Patient able to express need for assistance with ADLs?: Yes Does the patient have difficulty dressing or bathing?: No Independently performs ADLs?: Yes (appropriate for developmental age) Does the patient have difficulty walking  or climbing stairs?: No Weakness of Legs: Both Weakness of Arms/Hands: None  Permission Sought/Granted                  Emotional Assessment Appearance:: Appears stated age Attitude/Demeanor/Rapport: Engaged Affect (typically observed): Appropriate Orientation: : Oriented to Self, Oriented to Place, Oriented to  Time, Oriented to Situation Alcohol / Substance Use: Not Applicable Psych Involvement: No (comment)  Admission diagnosis:  HAP (hospital-acquired pneumonia) [J18.9, Y95] Acute metabolic encephalopathy [G93.41] Patient Active Problem List   Diagnosis Date Noted   Acute metabolic encephalopathy 05/16/2022   Left upper extremity swelling 03/08/2022   PAD (peripheral artery disease) ([name]) 05/08/2021   Thrombocytopenia ([name]) 05/08/2021   COVID-19 virus infection 05/08/2021   Malnutrition of moderate degree 07/14/2020   AMS (altered mental status) 07/13/2020   Hypertensive urgency 07/13/2020   Hyperlipidemia associated with type 2 diabetes mellitus ([name])    History of CVA (cerebrovascular accident) 05/10/2020   Stupor 05/10/2020   TIA (transient ischemic attack) 05/09/2020   Acute encephalopathy 10/03/2019   Febrile illness 08/28/2019   Personal history of anaphylaxis 08/28/2019   Coagulation defect, unspecified ([name]) 05/05/2019   Gastroparesis due to DM ([name]) 04/28/2019   Labile blood glucose    Anemia of chronic disease    Anemia of chronic kidney failure    S/P BKA (below knee amputation) bilateral ([name]) 03/31/2019   ESRD on  peritoneal dialysis ([name])    ESRD (end stage renal disease) on dialysis ([name]) 03/19/2019   Hypertension associated with diabetes ([name])    Type 2 diabetes mellitus with chronic kidney disease on chronic dialysis, with long-term current use of insulin ([name])    Encounter for adequacy testing for peritoneal dialysis ([name]) 01/10/2019   Other disorders of bilirubin metabolism 01/10/2019   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 [place] [address] Phone: [phone] Fax: [phone]     Social Determinants of Health (SDOH) Social History: SDOH Screenings   Food Insecurity: No Food Insecurity (05/16/2022)  Housing: Low Risk  (05/16/2022)  Transportation Needs: No Transportation Needs (05/16/2022)  Utilities: Not At Risk (05/16/2022)  Depression (PHQ2-9): Low Risk  (06/21/2020)  Tobacco Use: Medium Risk (05/16/2022)   SDOH Interventions:     Readmission Risk Interventions     No data to display

## 2022-05-18 NOTE — Progress Notes (Signed)
[facility] Progress Note   52 y.o. male with PMH including ESRD on dialysis, DM, HTN, prior TIAs, and bilateral LE amputations who presented to the ED from dialysis on 05/16/22. Patient does not recall events at HD yesterday. According to outpatient records, patient became non-responsive mid dialysis. VS were stable at the time with BP 119/85. Patient woke up and reported feeling short of breath, but refused to go to the ED. However he became more somnolent. Temp 100.7 in ED, viral respiratory screen negative, CXR with cardiomegaly and interstitial edema or atypical infection. Started on IV vancomycin, cefepime. Received 2hr of HD Wed according to EMS.    Dialysis Orders: Center: [facility]  on MWF. 180NRe 4 hours BFR 400 DFR A1.5 2K 2.5Ca AVF 15g No heparin Mircera IV q 2 weeks Venofer 50mg  weekly Hectorol IV q HD Renvela 800mg  2 tabs PO TID with meals   Home BP meds: lisinopril 10mg  PO QHS  Assessment/ Plan:    Acute metabolic encephalopathy: Pt with lethargy, somnolence and brief unresponsive episode at HD with normal VS. Also febrile on arrival, primary team checking Bcx and has been started on broad spectrum antibiotics.   ESRD:  Attends dialysis on MWF schedule, had abbreviated HD Wed due to above events.   Seen on HD 3K bath 3L net UF goal 163/74 Plan next HD Mon on MWF regimen.   Hypertension/volume: BP high at HD recently and has not been meeting EDW for the past two sessions. CXR with interstitial edema vs. Atypical infection. On lisinopril outpatient but unclear if he has actually been taking it, consider switching to ARB if BP remains poorly controlled since lisinopril is dialyzable.   Anemia: Hgb at goal, no ESA indicated at this time.   Metabolic bone disease: Calcium controlled, continue hectorol and renvela.   Nutrition:  Alb 3.5. Will need renal diet and fluid restrictions T2DM: On semglee, per primary team  Subjective:    Denies f/c/n/v/sob.   Objective:   BP (!) 163/74 (BP Location: Left Arm)   Pulse (!) 55   Temp 98 F (36.7 C)   Resp 16   Wt 68.9 kg   SpO2 100%   BMI 21.19 kg/m   Intake/Output Summary (Last 24 hours) at 05/18/2022 1016 Last data filed at 05/17/2022 1627 Gross per 24 hour  Intake 100 ml  Output --  Net 100 ml   Weight change:   Physical Exam: General: Alert male in NAD Head: NCAT Neck: JVD not elevated. Lungs: CTA b/l Heart: RRR with normal S1, S2. No murmurs, rubs, or gallops appreciated. Abdomen: SNDNT + BS Lower extremities: B/L BKA, no edema b/l lower extremities Dialysis Access: RUE AVF + t/b  Imaging: CT Head Wo Contrast  Result Date: 05/16/2022 CLINICAL DATA:  Lethargy and altered mental status EXAM: CT HEAD WITHOUT CONTRAST TECHNIQUE: Contiguous axial images were obtained from the base of the skull through the vertex without intravenous contrast. RADIATION DOSE REDUCTION: This exam was performed according to the departmental dose-optimization program which includes automated exposure control, adjustment of the mA and/or kV according to patient size and/or use of iterative reconstruction technique. COMPARISON:  07/18/2020 FINDINGS: Brain: No evidence of acute infarction, hemorrhage, hydrocephalus, extra-axial collection or mass lesion/mass effect. Mild chronic white matter ischemic changes and atrophic changes are seen. Prior left frontal infarct is again identified and stable. Vascular: No hyperdense vessel or unexpected calcification. Skull: Normal. Negative for fracture or focal lesion. Sinuses/Orbits: No acute finding.  Other: None. IMPRESSION: Chronic atrophic and ischemic changes without acute abnormality. Electronically Signed   By: Alcide Clever M.D.   On: 05/16/2022 19:39   DG Chest Portable 1 View  Result Date: 05/16/2022 CLINICAL DATA:  Provided history: Shortness of breath. Additional history provided: patient from dialysis center, lethargy, altered mental  status. EXAM: PORTABLE CHEST 1 VIEW COMPARISON:  Prior chest radiographs 05/08/2021 and earlier. FINDINGS: Cardiomegaly. Aortic atherosclerosis. Prominence of the interstitial lung markings bilaterally. No appreciable airspace consolidation. No evidence of pleural effusion or pneumothorax. No acute osseous abnormality identified. Chronic, healed left rib fracture deformities. IMPRESSION: 1. Cardiomegaly. 2. Prominence of the interstitial lung markings bilaterally. This may reflect interstitial edema or atypical/viral infection. 3. Aortic Atherosclerosis (ICD10-I70.0). Electronically Signed   By: Jackey Loge D.O.   On: 05/16/2022 15:50    Labs: BMET Recent Labs  Lab 05/16/22 1419 05/16/22 1435 05/17/22 0801 05/18/22 0702  NA 136 136 135 135  K 4.1 4.1 4.1 4.4  CL 97* 101 100 98  CO2 25  --  25 22  GLUCOSE 147* 147* 169* 100*  BUN 33* 35* 43* 57*  CREATININE 5.17* 6.00* 6.28* 7.90*  CALCIUM 8.6*  --  8.6* 8.6*  PHOS  --   --  4.9* 5.3*   CBC Recent Labs  Lab 05/16/22 1419 05/16/22 1435 05/17/22 0801 05/18/22 0702  WBC 12.0*  --  8.9 6.2  HGB 10.6* 12.2* 10.0* 10.1*  HCT 33.8* 36.0* 32.2* 32.0*  MCV 92.3  --  92.5 91.4  PLT 54*  --  80* 80*    Medications:     atorvastatin  40 mg Oral Daily   Chlorhexidine Gluconate Cloth  6 each Topical Q0600   clopidogrel  75 mg Oral Daily   furosemide  20 mg Oral BID   insulin aspart  0-6 Units Subcutaneous TID WC   insulin glargine-yfgn  5 Units Subcutaneous QHS   LORazepam  0.5 mg Oral Q M,W,F-HD   losartan  25 mg Oral Daily   melatonin  10 mg Oral QHS   multivitamin  1 tablet Oral q AM   multivitamin with minerals  1 tablet Oral QHS   mupirocin ointment   Nasal BID   nicotine  7 mg Transdermal Daily   pregabalin  25 mg Oral Once per day on Mon Wed Fri   Ensure Max Protein  240 mL Oral q morning   sevelamer carbonate  1,600 mg Oral 2 times per day   sevelamer carbonate  3,200 mg Oral 3 times per day   sodium chloride flush  3  mL Intravenous Q12H   sodium chloride flush  3 mL Intravenous Q12H   [START ON 05/19/2022] Vitamin D (Ergocalciferol)  50,000 Units Oral Q Sat      Paulene Floor, MD 05/18/2022, 10:16 AM

## 2022-05-18 NOTE — NC FL2 (Signed)
[place] MEDICAID FL2 LEVEL OF CARE FORM     IDENTIFICATION  Patient Name: Jorge [name] Birthdate: 1970-09-23 Sex: male Admission Date (Current Location): 05/16/2022  [facility] and [place] Number:  Producer, television/film/video and Address:  [facility], [address], [place], [place] [address]      Provider Number:    Attending Physician Name and Address:  Rodolph Bong, MD  Relative Name and Phone Number:  Nam, Reish (Sister)   [phone] [facility])    Current Level of Care: Hospital Recommended Level of Care: Skilled Nursing Facility Prior Approval Number:    Date Approved/Denied:   PASRR Number: [id number]  Discharge Plan: SNF    Current Diagnoses: Patient Active Problem List   Diagnosis Date Noted   Acute metabolic encephalopathy 05/16/2022   Left upper extremity swelling 03/08/2022   PAD (peripheral artery disease) ([name]) 05/08/2021   Thrombocytopenia ([name]) 05/08/2021   COVID-19 virus infection 05/08/2021   Malnutrition of moderate degree 07/14/2020   AMS (altered mental status) 07/13/2020   Hypertensive urgency 07/13/2020   Hyperlipidemia associated with type 2 diabetes mellitus ([name])    History of CVA (cerebrovascular accident) 05/10/2020   Stupor 05/10/2020   TIA (transient ischemic attack) 05/09/2020   Acute encephalopathy 10/03/2019   Febrile illness 08/28/2019   Personal history of anaphylaxis 08/28/2019   Coagulation defect, unspecified ([name]) 05/05/2019   Gastroparesis due to DM ([name]) 04/28/2019   Labile blood glucose    Anemia of chronic disease    Anemia of chronic kidney failure    S/P BKA (below knee amputation) bilateral ([name]) 03/31/2019   ESRD on peritoneal dialysis ([name])    ESRD (end stage renal disease) on dialysis ([name]) 03/19/2019   Hypertension associated with diabetes ([name])    Type 2 diabetes mellitus with chronic kidney disease on chronic dialysis, with long-term current use of insulin ([name])    Encounter for  adequacy testing for peritoneal dialysis ([name]) 01/10/2019   Other disorders of bilirubin metabolism 01/10/2019   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   Other disorders resulting from impaired renal tubular function 07/05/2016   Secondary hyperparathyroidism of renal origin ([name]) 07/05/2016    Orientation RESPIRATION BLADDER Height & Weight     Self, Time, Situation, Place  Normal, O2 (2L) Continent Weight: 151 lb 14.4 oz (68.9 kg) Height:     BEHAVIORAL SYMPTOMS/MOOD NEUROLOGICAL BOWEL NUTRITION STATUS      Incontinent Diet (see discharge summary)  AMBULATORY STATUS COMMUNICATION OF NEEDS Skin   Limited Assist Verbally Normal                       Personal Care Assistance Level of Assistance  Bathing, Feeding, Dressing Bathing Assistance: Maximum assistance Feeding assistance: Independent Dressing Assistance: Maximum assistance     Functional Limitations Info  Sight, Hearing, Speech Sight Info: Adequate Hearing Info: Adequate Speech Info: Adequate    SPECIAL CARE FACTORS FREQUENCY                       Contractures Contractures Info: Not present    Additional Factors Info  Code Status Code Status Info: FULL             Current Medications (05/18/2022):  This is the current hospital active medication list Current Facility-Administered Medications  Medication Dose Route Frequency Provider Last Rate Last Admin   0.9 %  sodium chloride infusion  250 mL Intravenous PRN Rodolph Bong, MD  acetaminophen (TYLENOL) tablet 650 mg  650 mg Oral Q6H PRN Rodolph Bong, MD       Or   acetaminophen (TYLENOL) suppository 650 mg  650 mg Rectal Q6H PRN Rodolph Bong, MD       atorvastatin (LIPITOR) tablet 40 mg  40 mg Oral Daily Rodolph Bong, MD   40 mg at 05/18/22 1346   ceFEPIme (MAXIPIME) 1 g in sodium chloride 0.9 % 100 mL IVPB  1 g Intravenous Q24H Rodolph Bong, MD 200 mL/hr at 05/18/22 1517 1 g at 05/18/22 1517   Chlorhexidine Gluconate  Cloth 2 % PADS 6 each  6 each Topical Q0600 Oretha Milch, PA-C   6 each at 05/18/22 1610   clopidogrel (PLAVIX) tablet 75 mg  75 mg Oral Daily Rodolph Bong, MD   75 mg at 05/18/22 1346   furosemide (LASIX) tablet 20 mg  20 mg Oral BID Rodolph Bong, MD   20 mg at 05/17/22 1726   hydrALAZINE (APRESOLINE) injection 10 mg  10 mg Intravenous Q6H PRN Rodolph Bong, MD       hydrocortisone cream 0.5 %   Topical Q8H PRN Rodolph Bong, MD       hydrocortisone cream 1 % 1 Application  1 Application Topical Q6H PRN Rodolph Bong, MD       insulin aspart (novoLOG) injection 0-6 Units  0-6 Units Subcutaneous TID WC Rodolph Bong, MD   1 Units at 05/17/22 1726   insulin glargine-yfgn (SEMGLEE) injection 5 Units  5 Units Subcutaneous QHS Rodolph Bong, MD   5 Units at 05/17/22 2231   levalbuterol (XOPENEX) nebulizer solution 0.63 mg  0.63 mg Nebulization Q6H PRN Rodolph Bong, MD       loperamide (IMODIUM) capsule 2 mg  2 mg Oral PRN Rodolph Bong, MD       LORazepam (ATIVAN) tablet 0.5 mg  0.5 mg Oral Q M,W,F-HD Rodolph Bong, MD       LORazepam (ATIVAN) tablet 0.5 mg  0.5 mg Oral BID PRN Hillary Bow, DO       losartan (COZAAR) tablet 25 mg  25 mg Oral Daily Rodolph Bong, MD   25 mg at 05/18/22 1343   melatonin tablet 10 mg  10 mg Oral QHS Rodolph Bong, MD   10 mg at 05/17/22 2231   multivitamin (RENA-VIT) tablet 1 tablet  1 tablet Oral q AM Rodolph Bong, MD   1 tablet at 05/18/22 9604   multivitamin with minerals tablet 1 tablet  1 tablet Oral QHS Rodolph Bong, MD   1 tablet at 05/17/22 2231   mupirocin ointment (BACTROBAN) 2 %   Nasal BID Rodolph Bong, MD       nicotine (NICODERM CQ - dosed in mg/24 hr) patch 7 mg  7 mg Transdermal Daily Rodolph Bong, MD   7 mg at 05/18/22 1346   ondansetron (ZOFRAN) tablet 4 mg  4 mg Oral Q6H PRN Rodolph Bong, MD       Or   ondansetron [facility]) injection 4 mg  4 mg  Intravenous Q6H PRN Rodolph Bong, MD   4 mg at 05/16/22 2012   polyethylene glycol (MIRALAX / GLYCOLAX) packet 17 g  17 g Oral Daily PRN Rodolph Bong, MD       polyvinyl alcohol (LIQUIFILM TEARS) 1.4 % ophthalmic solution 1 drop  1 drop Both Eyes QHS PRN  Rodolph Bong, MD       pregabalin [facility]) capsule 25 mg  25 mg Oral Once per day on Mon Wed Fri Rodolph Bong, MD       [START ON 05/19/2022] pregabalin (LYRICA) capsule 25 mg  25 mg Oral Daily Rodolph Bong, MD       promethazine (PHENERGAN) suppository 25 mg  25 mg Rectal Q6H PRN Rodolph Bong, MD       protein supplement (ENSURE MAX) liquid  240 mL Oral q morning Rodolph Bong, MD   240 mL at 05/18/22 1347   sevelamer carbonate (RENVELA) tablet 1,600 mg  1,600 mg Oral 2 times per day Rodolph Bong, MD   1,600 mg at 05/18/22 1343   sevelamer carbonate (RENVELA) tablet 3,200 mg  3,200 mg Oral 3 times per day Rodolph Bong, MD       sodium chloride flush (NS) 0.9 % injection 3 mL  3 mL Intravenous Q12H Rodolph Bong, MD   3 mL at 05/17/22 2234   sodium chloride flush (NS) 0.9 % injection 3 mL  3 mL Intravenous Q12H Rodolph Bong, MD   3 mL at 05/17/22 2234   sodium chloride flush (NS) 0.9 % injection 3 mL  3 mL Intravenous PRN Rodolph Bong, MD       sorbitol 70 % solution 30 mL  30 mL Oral Daily PRN Rodolph Bong, MD       traMADol Janean Sark) tablet 25 mg  25 mg Oral Q6H PRN Rodolph Bong, MD       vancomycin (VANCOREADY) IVPB 750 mg/150 mL  750 mg Intravenous Q M,W,F-HD Rodolph Bong, MD   Stopped at 05/18/22 1345   [START ON 05/19/2022] Vitamin D (Ergocalciferol) (DRISDOL) 1.25 MG (50000 UNIT) capsule 50,000 Units  50,000 Units Oral Q Sat Rodolph Bong, MD         Discharge Medications: Please see discharge summary for a list of discharge medications.  Relevant Imaging Results:  Relevant Lab Results:   Additional Information SSN: [id number]  Madyn Ivins A  [place], [place]

## 2022-05-19 DIAGNOSIS — E1122 Type 2 diabetes mellitus with diabetic chronic kidney disease: Secondary | ICD-10-CM | POA: Diagnosis not present

## 2022-05-19 DIAGNOSIS — N186 End stage renal disease: Secondary | ICD-10-CM | POA: Diagnosis not present

## 2022-05-19 DIAGNOSIS — D638 Anemia in other chronic diseases classified elsewhere: Secondary | ICD-10-CM | POA: Diagnosis not present

## 2022-05-19 DIAGNOSIS — G9341 Metabolic encephalopathy: Secondary | ICD-10-CM | POA: Diagnosis not present

## 2022-05-19 LAB — GLUCOSE, CAPILLARY
Glucose-Capillary: 69 mg/dL — ABNORMAL LOW (ref 70–99)
Glucose-Capillary: 177 mg/dL — ABNORMAL HIGH (ref 70–99)
Glucose-Capillary: 150 mg/dL — ABNORMAL HIGH (ref 70–99)
Glucose-Capillary: 104 mg/dL — ABNORMAL HIGH (ref 70–99)

## 2022-05-19 LAB — CBC
HCT: 31.2 % — ABNORMAL LOW (ref 39.0–52.0)
Hemoglobin: 9.7 g/dL — ABNORMAL LOW (ref 13.0–17.0)
MCH: 28.2 pg (ref 26.0–34.0)
MCHC: 31.1 g/dL (ref 30.0–36.0)
MCV: 90.7 fL (ref 80.0–100.0)
Platelets: 95 10*3/uL — ABNORMAL LOW (ref 150–400)
RBC: 3.44 MIL/uL — ABNORMAL LOW (ref 4.22–5.81)
RDW: 19.2 % — ABNORMAL HIGH (ref 11.5–15.5)
WBC: 4.2 10*3/uL (ref 4.0–10.5)
nRBC: 0 % (ref 0.0–0.2)

## 2022-05-19 LAB — RENAL FUNCTION PANEL
Albumin: 2.8 g/dL — ABNORMAL LOW (ref 3.5–5.0)
Anion gap: 13 (ref 5–15)
BUN: 40 mg/dL — ABNORMAL HIGH (ref 6–20)
CO2: 26 mmol/L (ref 22–32)
Calcium: 8.5 mg/dL — ABNORMAL LOW (ref 8.9–10.3)
Chloride: 99 mmol/L (ref 98–111)
Creatinine, Ser: 6.38 mg/dL — ABNORMAL HIGH (ref 0.61–1.24)
GFR, Estimated: 10 mL/min — ABNORMAL LOW (ref >60)
Glucose, Bld: 61 mg/dL — ABNORMAL LOW (ref 70–99)
Phosphorus: 3.6 mg/dL (ref 2.5–4.6)
Potassium: 3.8 mmol/L (ref 3.5–5.1)
Sodium: 138 mmol/L (ref 135–145)

## 2022-05-19 LAB — CULTURE, BLOOD (ROUTINE X 2)

## 2022-05-19 MED ORDER — TRAZODONE HCL 50 MG PO TABS: 50.0000 mg | ORAL_TABLET | Freq: Every evening | ORAL | Status: DC | PRN

## 2022-05-19 MED ORDER — LOSARTAN POTASSIUM 50 MG PO TABS
50.0000 mg | ORAL_TABLET | Freq: Every day | ORAL | Status: DC
  Administered 2022-05-19: 50 mg via ORAL
  Filled 2022-05-19: qty 1

## 2022-05-19 MED ORDER — LOSARTAN POTASSIUM 50 MG PO TABS: 50.0000 mg | ORAL_TABLET | Freq: Every day | ORAL | 1 refills | Status: DC

## 2022-05-19 MED ORDER — TRAMADOL HCL 50 MG PO TABS: 25.0000 mg | ORAL_TABLET | Freq: Four times a day (QID) | ORAL | 0 refills | Status: DC | PRN

## 2022-05-19 MED ORDER — SODIUM CHLORIDE 0.9 % IV SOLN
1.0000 g | INTRAVENOUS | Status: DC
  Administered 2022-05-19: 1 g via INTRAVENOUS
  Filled 2022-05-19: qty 10

## 2022-05-19 MED ORDER — LORAZEPAM 0.5 MG PO TABS: 0.5000 mg | ORAL_TABLET | ORAL | 0 refills | Status: DC

## 2022-05-19 MED ORDER — NICOTINE 7 MG/24HR TD PT24: 7.0000 mg | MEDICATED_PATCH | Freq: Every day | TRANSDERMAL | 0 refills | Status: DC

## 2022-05-19 NOTE — Plan of Care (Signed)

## 2022-05-19 NOTE — Progress Notes (Signed)
[facility] Progress Note   52 y.o. male with PMH including ESRD on dialysis, DM, HTN, prior TIAs, and bilateral LE amputations who presented to the ED from dialysis on 05/16/22. Patient does not recall events at HD yesterday. According to outpatient records, patient became non-responsive mid dialysis. VS were stable at the time with BP 119/85. Patient woke up and reported feeling short of breath, but refused to go to the ED. However he became more somnolent. Temp 100.7 in ED, viral respiratory screen negative, CXR with cardiomegaly and interstitial edema or atypical infection. Started on IV vancomycin, cefepime. Received 2hr of HD Wed according to EMS.    Dialysis Orders: Center: [facility]  on MWF. 180NRe 4 hours BFR 400 DFR A1.5 2K 2.5Ca AVF 15g EDW 68kg No heparin Mircera IV q 2 weeks Venofer 50mg  weekly Hectorol IV q HD Renvela 800mg  2 tabs PO TID with meals   Home BP meds: lisinopril 10mg  PO QHS  Assessment/ Plan:    Acute metabolic encephalopathy: Pt with lethargy, somnolence and brief unresponsive episode at HD with normal VS. Also febrile on arrival, primary team checking Bcx and has been started on broad spectrum antibiotics.  BLCX still NTD.  ESRD:  Attends dialysis on MWF schedule, had abbreviated HD Wed due to above events.   Tolerated HD on Fri with 3L net UF. Zero out bed and weigh pt this am.  Plan next HD Mon on MWF regimen if he's still here.   Hypertension/volume: BP high at HD recently and has not been meeting EDW for the past two sessions. CXR with interstitial edema vs. Atypical infection. On lisinopril outpatient but unclear if he has actually been taking it -> changed to Losartan 25 -> incr to 50mg  daily.  Anemia: Hgb at goal, no ESA indicated at this time.   Metabolic bone disease: Calcium controlled, continue hectorol and renvela.   Nutrition:  Alb 3.5. Will need renal diet and fluid restrictions T2DM: On semglee,  per primary team  Subjective:   Denies f/c/n/v/sob.   Objective:   BP (!) 184/80 (BP Location: Left Arm)   Pulse (!) 59   Temp 98 F (36.7 C)   Resp 20   Wt 68.9 kg   SpO2 99%   BMI 21.19 kg/m   Intake/Output Summary (Last 24 hours) at 05/19/2022 0908 Last data filed at 05/19/2022 0600 Gross per 24 hour  Intake 240 ml  Output 3000 ml  Net -2760 ml   Weight change: 1.4 kg  Physical Exam: General: Alert male in NAD Head: NCAT Neck: JVD not elevated. Lungs: CTA b/l Heart: RRR with normal S1, S2. No murmurs, rubs, or gallops appreciated. Abdomen: SNDNT + BS Lower extremities: B/L BKA, no edema b/l lower extremities Dialysis Access: RUE AVF + t/b  Imaging: No results found.  Labs: BMET Recent Labs  Lab 05/16/22 1419 05/16/22 1435 05/17/22 0801 05/18/22 0702 05/19/22 0419  NA 136 136 135 135 138  K 4.1 4.1 4.1 4.4 3.8  CL 97* 101 100 98 99  CO2 25  --  25 22 26   GLUCOSE 147* 147* 169* 100* 61*  BUN 33* 35* 43* 57* 40*  CREATININE 5.17* 6.00* 6.28* 7.90* 6.38*  CALCIUM 8.6*  --  8.6* 8.6* 8.5*  PHOS  --   --  4.9* 5.3* 3.6   CBC Recent Labs  Lab 05/17/22 0801 05/18/22 0702 05/18/22 0904 05/19/22 0419  WBC 8.9 6.2 5.3 4.2  HGB 10.0* 10.1*  9.8* 9.7*  HCT 32.2* 32.0* 31.6* 31.2*  MCV 92.5 91.4 91.9 90.7  PLT 80* 80* 88* 95*    Medications:     atorvastatin  40 mg Oral Daily   Chlorhexidine Gluconate Cloth  6 each Topical Q0600   clopidogrel  75 mg Oral Daily   furosemide  20 mg Oral BID   insulin aspart  0-6 Units Subcutaneous TID WC   insulin glargine-yfgn  5 Units Subcutaneous QHS   LORazepam  0.5 mg Oral Q M,W,F-HD   losartan  25 mg Oral Daily   melatonin  10 mg Oral QHS   multivitamin  1 tablet Oral q AM   multivitamin with minerals  1 tablet Oral QHS   mupirocin ointment   Nasal BID   nicotine  7 mg Transdermal Daily   pregabalin  25 mg Oral Once per day on Mon Wed Fri   pregabalin  25 mg Oral Daily   Ensure Max Protein  240 mL Oral q  morning   sevelamer carbonate  1,600 mg Oral 2 times per day   sevelamer carbonate  3,200 mg Oral 3 times per day   sodium chloride flush  3 mL Intravenous Q12H   sodium chloride flush  3 mL Intravenous Q12H   Vitamin D (Ergocalciferol)  50,000 Units Oral Q Sat      Paulene Floor, MD 05/19/2022, 9:08 AM

## 2022-05-19 NOTE — Plan of Care (Signed)
  Problem: Activity: Goal: Ability to tolerate increased activity will improve Outcome: Adequate for Discharge   Problem: Clinical Measurements: Goal: Ability to maintain a body temperature in the normal range will improve Outcome: Adequate for Discharge   Problem: Respiratory: Goal: Ability to maintain adequate ventilation will improve Outcome: Adequate for Discharge Goal: Ability to maintain a clear airway will improve Outcome: Adequate for Discharge   Problem: Education: Goal: Ability to describe self-care measures that may prevent or decrease complications (Diabetes Survival Skills Education) will improve Outcome: Adequate for Discharge Goal: Individualized Educational Video(s) Outcome: Adequate for Discharge   Problem: Coping: Goal: Ability to adjust to condition or change in health will improve Outcome: Adequate for Discharge   Problem: Fluid Volume: Goal: Ability to maintain a balanced intake and output will improve Outcome: Adequate for Discharge   Problem: Health Behavior/Discharge Planning: Goal: Ability to identify and utilize available resources and services will improve Outcome: Adequate for Discharge Goal: Ability to manage health-related needs will improve Outcome: Adequate for Discharge   Problem: Metabolic: Goal: Ability to maintain appropriate glucose levels will improve Outcome: Adequate for Discharge   Problem: Nutritional: Goal: Maintenance of adequate nutrition will improve Outcome: Adequate for Discharge Goal: Progress toward achieving an optimal weight will improve Outcome: Adequate for Discharge   Problem: Skin Integrity: Goal: Risk for impaired skin integrity will decrease Outcome: Adequate for Discharge   Problem: Tissue Perfusion: Goal: Adequacy of tissue perfusion will improve Outcome: Adequate for Discharge   Problem: Education: Goal: Knowledge of General Education information will improve Description: Including pain rating scale,  medication(s)/side effects and non-pharmacologic comfort measures Outcome: Adequate for Discharge   Problem: Health Behavior/Discharge Planning: Goal: Ability to manage health-related needs will improve Outcome: Adequate for Discharge   Problem: Clinical Measurements: Goal: Ability to maintain clinical measurements within normal limits will improve Outcome: Adequate for Discharge Goal: Will remain free from infection Outcome: Adequate for Discharge Goal: Diagnostic test results will improve Outcome: Adequate for Discharge Goal: Respiratory complications will improve Outcome: Adequate for Discharge Goal: Cardiovascular complication will be avoided Outcome: Adequate for Discharge   Problem: Activity: Goal: Risk for activity intolerance will decrease Outcome: Adequate for Discharge   Problem: Nutrition: Goal: Adequate nutrition will be maintained Outcome: Adequate for Discharge   Problem: Coping: Goal: Level of anxiety will decrease Outcome: Adequate for Discharge   Problem: Elimination: Goal: Will not experience complications related to bowel motility Outcome: Adequate for Discharge Goal: Will not experience complications related to urinary retention Outcome: Adequate for Discharge   Problem: Pain Managment: Goal: General experience of comfort will improve Outcome: Adequate for Discharge   Problem: Safety: Goal: Ability to remain free from injury will improve Outcome: Adequate for Discharge   Problem: Skin Integrity: Goal: Risk for impaired skin integrity will decrease Outcome: Adequate for Discharge   

## 2022-05-19 NOTE — Plan of Care (Signed)
Problem: Activity: Goal: Ability to tolerate increased activity will improve 05/19/2022 1456 by Efraim Kaufmann, RN Outcome: Adequate for Discharge 05/19/2022 1456 by Efraim Kaufmann, RN Outcome: Adequate for Discharge   Problem: Clinical Measurements: Goal: Ability to maintain a body temperature in the normal range will improve 05/19/2022 1456 by Efraim Kaufmann, RN Outcome: Adequate for Discharge 05/19/2022 1456 by Efraim Kaufmann, RN Outcome: Adequate for Discharge   Problem: Respiratory: Goal: Ability to maintain adequate ventilation will improve 05/19/2022 1456 by Efraim Kaufmann, RN Outcome: Adequate for Discharge 05/19/2022 1456 by Efraim Kaufmann, RN Outcome: Adequate for Discharge Goal: Ability to maintain a clear airway will improve 05/19/2022 1456 by Efraim Kaufmann, RN Outcome: Adequate for Discharge 05/19/2022 1456 by Efraim Kaufmann, RN Outcome: Adequate for Discharge   Problem: Education: Goal: Ability to describe self-care measures that may prevent or decrease complications (Diabetes Survival Skills Education) will improve 05/19/2022 1456 by Efraim Kaufmann, RN Outcome: Adequate for Discharge 05/19/2022 1456 by Efraim Kaufmann, RN Outcome: Adequate for Discharge Goal: Individualized Educational Video(s) 05/19/2022 1456 by Efraim Kaufmann, RN Outcome: Adequate for Discharge 05/19/2022 1456 by Efraim Kaufmann, RN Outcome: Adequate for Discharge   Problem: Coping: Goal: Ability to adjust to condition or change in health will improve 05/19/2022 1456 by Efraim Kaufmann, RN Outcome: Adequate for Discharge 05/19/2022 1456 by Efraim Kaufmann, RN Outcome: Adequate for Discharge   Problem: Fluid Volume: Goal: Ability to maintain a balanced intake and output will improve 05/19/2022 1456 by Efraim Kaufmann, RN Outcome: Adequate for Discharge 05/19/2022 1456 by Efraim Kaufmann, RN Outcome: Adequate for Discharge   Problem: Health  Behavior/Discharge Planning: Goal: Ability to identify and utilize available resources and services will improve 05/19/2022 1456 by Efraim Kaufmann, RN Outcome: Adequate for Discharge 05/19/2022 1456 by Efraim Kaufmann, RN Outcome: Adequate for Discharge Goal: Ability to manage health-related needs will improve 05/19/2022 1456 by Efraim Kaufmann, RN Outcome: Adequate for Discharge 05/19/2022 1456 by Efraim Kaufmann, RN Outcome: Adequate for Discharge   Problem: Metabolic: Goal: Ability to maintain appropriate glucose levels will improve 05/19/2022 1456 by Efraim Kaufmann, RN Outcome: Adequate for Discharge 05/19/2022 1456 by Efraim Kaufmann, RN Outcome: Adequate for Discharge   Problem: Nutritional: Goal: Maintenance of adequate nutrition will improve 05/19/2022 1456 by Efraim Kaufmann, RN Outcome: Adequate for Discharge 05/19/2022 1456 by Efraim Kaufmann, RN Outcome: Adequate for Discharge Goal: Progress toward achieving an optimal weight will improve 05/19/2022 1456 by Efraim Kaufmann, RN Outcome: Adequate for Discharge 05/19/2022 1456 by Efraim Kaufmann, RN Outcome: Adequate for Discharge   Problem: Skin Integrity: Goal: Risk for impaired skin integrity will decrease 05/19/2022 1456 by Efraim Kaufmann, RN Outcome: Adequate for Discharge 05/19/2022 1456 by Efraim Kaufmann, RN Outcome: Adequate for Discharge   Problem: Tissue Perfusion: Goal: Adequacy of tissue perfusion will improve 05/19/2022 1456 by Efraim Kaufmann, RN Outcome: Adequate for Discharge 05/19/2022 1456 by Efraim Kaufmann, RN Outcome: Adequate for Discharge   Problem: Education: Goal: Knowledge of General Education information will improve Description: Including pain rating scale, medication(s)/side effects and non-pharmacologic comfort measures 05/19/2022 1456 by Efraim Kaufmann, RN Outcome: Adequate for Discharge 05/19/2022 1456 by Efraim Kaufmann, RN Outcome: Adequate for  Discharge   Problem: Health Behavior/Discharge Planning: Goal: Ability to manage health-related needs will improve 05/19/2022 1456 by Efraim Kaufmann, RN Outcome: Adequate for Discharge 05/19/2022 1456 by Efraim Kaufmann, RN Outcome: Adequate for Discharge   Problem: Clinical Measurements: Goal: Ability to maintain clinical measurements within normal limits will improve 05/19/2022 1456 by Efraim Kaufmann, RN Outcome: Adequate for Discharge 05/19/2022 1456 by Stephan Minister,  Santrice Muzio, RN Outcome: Adequate for Discharge Goal: Will remain free from infection 05/19/2022 1456 by Efraim Kaufmann, RN Outcome: Adequate for Discharge 05/19/2022 1456 by Efraim Kaufmann, RN Outcome: Adequate for Discharge Goal: Diagnostic test results will improve 05/19/2022 1456 by Efraim Kaufmann, RN Outcome: Adequate for Discharge 05/19/2022 1456 by Efraim Kaufmann, RN Outcome: Adequate for Discharge Goal: Respiratory complications will improve 05/19/2022 1456 by Efraim Kaufmann, RN Outcome: Adequate for Discharge 05/19/2022 1456 by Efraim Kaufmann, RN Outcome: Adequate for Discharge Goal: Cardiovascular complication will be avoided 05/19/2022 1456 by Efraim Kaufmann, RN Outcome: Adequate for Discharge 05/19/2022 1456 by Efraim Kaufmann, RN Outcome: Adequate for Discharge   Problem: Activity: Goal: Risk for activity intolerance will decrease 05/19/2022 1456 by Efraim Kaufmann, RN Outcome: Adequate for Discharge 05/19/2022 1456 by Efraim Kaufmann, RN Outcome: Adequate for Discharge   Problem: Nutrition: Goal: Adequate nutrition will be maintained 05/19/2022 1456 by Efraim Kaufmann, RN Outcome: Adequate for Discharge 05/19/2022 1456 by Efraim Kaufmann, RN Outcome: Adequate for Discharge   Problem: Coping: Goal: Level of anxiety will decrease 05/19/2022 1456 by Efraim Kaufmann, RN Outcome: Adequate for Discharge 05/19/2022 1456 by Efraim Kaufmann, RN Outcome: Adequate for  Discharge   Problem: Elimination: Goal: Will not experience complications related to bowel motility 05/19/2022 1456 by Efraim Kaufmann, RN Outcome: Adequate for Discharge 05/19/2022 1456 by Efraim Kaufmann, RN Outcome: Adequate for Discharge Goal: Will not experience complications related to urinary retention 05/19/2022 1456 by Efraim Kaufmann, RN Outcome: Adequate for Discharge 05/19/2022 1456 by Efraim Kaufmann, RN Outcome: Adequate for Discharge   Problem: Pain Managment: Goal: General experience of comfort will improve 05/19/2022 1456 by Efraim Kaufmann, RN Outcome: Adequate for Discharge 05/19/2022 1456 by Efraim Kaufmann, RN Outcome: Adequate for Discharge   Problem: Safety: Goal: Ability to remain free from injury will improve 05/19/2022 1456 by Efraim Kaufmann, RN Outcome: Adequate for Discharge 05/19/2022 1456 by Efraim Kaufmann, RN Outcome: Adequate for Discharge   Problem: Skin Integrity: Goal: Risk for impaired skin integrity will decrease 05/19/2022 1456 by Efraim Kaufmann, RN Outcome: Adequate for Discharge 05/19/2022 1456 by Efraim Kaufmann, RN Outcome: Adequate for Discharge

## 2022-05-19 NOTE — TOC Transition Note (Signed)
Transition of Care [facility]) - CM/SW Discharge Note   Patient Details  Name: Jorge [name] MRN: [id number] Date of Birth: August 27, 1970  [facility]) CM/SW Contact:  Deatra Robinson, LCSW Phone Number: 05/19/2022, 2:39 PM   Clinical Narrative:  Pt for dc back to [facility] where he is a LTC resident. Spoke to [facility] in admissions who confirmed they are prepared to admit pt back to room 141-B. Pt aware of dc and reports agreeable. RN provided with number for report and PTAR arranged for transport. SW signing off at dc.   Dellie Burns, MSW, LCSW [phone] (coverage)       Final next level of care: Skilled Nursing Facility Barriers to Discharge: No Barriers Identified   Patient Goals and CMS Choice      Discharge Placement                Patient chooses bed at: Other - please specify in the comment section below: [facility]) Patient to be transferred to facility by: PTAR Name of family member notified: [name] and will update family Patient and family notified of of transfer: 05/19/22  Discharge Plan and Services Additional resources added to the After Visit Summary for   In-house Referral: Clinical Social Work                                   Social Determinants of Health (SDOH) Interventions SDOH Screenings   Food Insecurity: No Food Insecurity (05/16/2022)  Housing: Low Risk  (05/16/2022)  Transportation Needs: No Transportation Needs (05/16/2022)  Utilities: Not At Risk (05/16/2022)  Depression (PHQ2-9): Low Risk  (06/21/2020)  Tobacco Use: Medium Risk (05/16/2022)     Readmission Risk Interventions     No data to display

## 2022-05-19 NOTE — Progress Notes (Signed)
Report called to Maurin, RN at [facility] place.

## 2022-05-19 NOTE — Plan of Care (Signed)
[facility] Patient Discharge Orders- [facility] CLINIC: [facility]  Patient's name: ESIAH COFFELL Admit/DC Dates: 05/16/2022 - 05/19/2022  Discharge Diagnoses: Acute metabolic encephalopathy - cause unclear, negative work-up.  Fever - blood cultures negative x 2 days  Aranesp: Given: none    Last Hgb: 9.7 PRBC's Given: none  ESA dose for discharge: mircera 75 mcg IV q 2 weeks (same) IV Iron dose at discharge: per protocol  Heparin change: no  EDW Change: yes New EDW: 67kg  Bath Change: no  Access intervention/Change: no  Hectorol/Calcitriol change: no  Discharge Labs: Calcium 8.5 Phosphorus 3.6 Albumin 2.8 K+ 3.8  IV Antibiotics: no Details:  On Coumadin?: no   OTHER/APPTS/LAB ORDERS: - D/c to SNF [facility]) - BP meds changed. Lisinopril stopped, started losartan 50mg  QHS. Verify MAR with SNF.    D/C Meds to be reconciled by nurse after every discharge.  Completed By: Ozzie Hoyle, PA-C [facility] Pager [phone]   Reviewed by: MD:______ RN_______

## 2022-05-19 NOTE — Discharge Summary (Signed)
Physician Discharge Summary  Jorge [name]:[id number] DOB: 1970/12/29 DOA: 05/16/2022  PCP: Patient, No Pcp Per  Admit date: 05/16/2022 Discharge date: 05/19/2022  Time spent: 55 minutes  Recommendations for Outpatient Follow-up:  Follow-up with MD at skilled nursing facility.  Patient's blood pressure needS to be reassessed on follow-up.  Finalization of blood cultures during the hospitalization will need to be followed up upon. Follow-up with regular hemodialysis center on Monday, 05/21/2022.   Discharge Diagnoses:  Principal Problem:   Acute metabolic encephalopathy Active Problems:   ESRD (end stage renal disease) on dialysis ([name])   Type 2 diabetes mellitus with chronic kidney disease on chronic dialysis, with long-term current use of insulin ([name])   Hypertension associated with diabetes ([name])   S/P BKA (below knee amputation) bilateral ([name])   Anemia of chronic disease   Gastroparesis due to DM ([name])   Secondary hyperparathyroidism of renal origin ([name])   Hyperlipidemia associated with type 2 diabetes mellitus ([name])   Discharge Condition: Stable and improved.  Diet recommendation: Renal diet/heart healthy  Filed Weights   05/18/22 0500 05/18/22 0845  Weight: 67.5 kg 68.9 kg    History of present illness:  Jorge [name] is a 52 y.o. male with medical history significant of end-stage renal disease on hemodialysis( MWF) x 5 years per patient, history of CVA x 3 per patient, bilateral BKA, PAD, secondary hyperparathyroidism, diabetes mellitus presenting to the ED from hemodialysis center with altered mental status. Per ED physician and per patient, patient seen in hemodialysis, asked to be placed on oxygen for comfort, had a feeling of shortness of breath and noted to be extremely lethargic.  EMS called however patient initially refused transportation to the ED.  EMS was called a second time as patient was too somnolent to stay awake and noted to be febrile on arrival in the  ED with a temp of 100.7.  Patient noted to be prescribed tramadol and Ativan and states took 0.5 mg of Ativan as previously prescribed prior to hemodialysis due to history of extreme generalized anxiety.  Patient seen in the ED and per ED physician received a dose of Narcan.  Patient denies any chills, no cough, no fever prior to admission, no chest pain.  Does endorse a bout of nausea and emesis this afternoon otherwise no further nausea or emesis.  No constipation.  Does endorse chronic diarrhea.  No melena, no hematemesis, no hematochezia.  No syncopal episodes.  Patient states has been compliant with all his medications.     ED Course: Patient seen in the ED, noted to have a temp of 100.7, initial blood pressure of 202/85 with sats of 95% on room air.  Comprehensive metabolic profile with chloride of 97, glucose of 147, BUN of 33, creatinine of 5.17 otherwise within normal limits.  CBC with a white count of 12, hemoglobin 10.6, platelet count of 54 otherwise within normal limits.  INR 1.1.  Influenza A PCR negative, influenza B PCR negative, respiratory syncytial virus by PCR negative, SARS coronavirus 2 PCR negative.  Chest x-ray done with cardiomegaly, prominence of interstitial lung markings bilaterally may reflect interstitial edema or atypical/viral infection, aortic atherosclerosis.  Patient given IV vancomycin, IV cefepime, IV fluids, hospitalist called to admit the patient.  Clinical improvement with mental status.  Hospital Course:  #1 acute metabolic encephalopathy -Patient presented from hemodialysis center due to lethargy, increased somnolence.  Noted to have a temp of 100.7 on presentation to the ED, leukocytosis white count of 12,  had some complaints of shortness of breath.  Elevated lactic acid level of 2.0 with repeat at 1.8. -Chest x-ray done with cardiomegaly, prominence of interstitial lung markings bilaterally may reflect interstitial edema or atypical viral infection, aortic  atherosclerosis. -CT head done with no acute abnormalities. -EDP concern for possible pneumonia. -Urine Legionella antigen pending, urine pneumococcus antigen pending likely due to as patient with minimal urine output.. -Blood cultures ordered and pending with no growth to date x 3 days. -Patient placed empirically on IV vancomycin and IV cefepime and completed 6-day course of antibiotic treatment. -Patient improved clinically, likely back at baseline during the hospitalization, afebrile for the rest of the hospitalization. -Patient will be discharged in stable and improved condition, no further antibiotics needed.  Outpatient follow-up with MD at SNF.   2.  End-stage renal disease on HD Monday Wednesday Friday -Patient presented from hemodialysis with altered mental status.  Patient also with some shortness of breath on presentation.. -Dialysis was not fully completed on day of admission, patient may still have a little bit of volume issues however sats of 95% on room air. -Patient seen in consultation by nephrology and patient underwent hemodialysis on his scheduled days.   -Outpatient follow-up at hemodialysis center on Monday, 05/21/2022.   3.  Diabetes mellitus type 2 -Hemoglobin A1c 6.2 (05/17/2022 ). -Repeat hemoglobin A1c at 6.2 (05/17/2022 ). -Patient maintained on home regimen Semglee 5 units nightly as well as SSI.   -Outpatient follow-up.     4.  Hyperlipidemia -Patient maintained on statin.     5.  History of CVA -CT head negative for any acute abnormalities.. -Patient maintained on home regimen of Plavix and statin for secondary stroke prophylaxis. -PT/OT.   6.  Hypertension -Patient noted to have elevated blood pressures in the 200s on presentation to the ED, noted to have received a dose of hydralazine 20 mg x 1 with improvement with blood pressure.. -Repeat blood pressure at time of admission interview with systolics in the 140s. -Patient noted to be on Lasix twice daily  prior to admission which was initially held and subsequently resumed.   -Patient subsequently started on Cozaar 25 mg daily and dose uptitrated to 50 mg daily for better blood pressure control.   -Patient also maintained on IV hydralazine as needed.   -Outpatient follow-up.     7.  Generalized anxiety disorder -Remained stable.   -Patient maintained on home regimen Ativan 0.5 mg Monday Wednesday Friday prior to HD as well as daily Ativan twice daily as needed. -Outpatient follow-up.      Procedures: CT head 05/16/2022 Chest x-ray 05/16/2022  Consultations: Nephrology: Dr. Juel Burrow 05/17/2022   Discharge Exam: Vitals:   05/19/22 0900 05/19/22 1415  BP:  (!) 159/82  Pulse: 61 70  Resp:  18  Temp:  98 F (36.7 C)  SpO2:  100%    General: NAD Cardiovascular: Regular rate rhythm no murmurs rubs or gallops.  No JVD.  No lower extremity edema. Respiratory: Clear to auscultation bilaterally.  No wheezes, no crackles, no rhonchi.  Fair air movement.  Speaking in full sentences.  Discharge Instructions   Discharge Instructions     Diet - low sodium heart healthy   Complete by: As directed    Increase activity slowly   Complete by: As directed       Allergies as of 05/19/2022       Reactions   Lactose Intolerance (gi) Diarrhea, Nausea Only        Medication  List     STOP taking these medications    amoxicillin-clavulanate 875-125 MG tablet Commonly known as: AUGMENTIN   gabapentin 300 MG capsule Commonly known as: NEURONTIN       TAKE these medications    Artificial Tears 0.2-0.2-1 % Soln Generic drug: Glycerin-Hypromellose-PEG 400 Place 1 drop into both eyes 4 (four) times daily.   atorvastatin 40 MG tablet Commonly known as: LIPITOR Take 1 tablet (40 mg total) by mouth daily.   clopidogrel 75 MG tablet Commonly known as: PLAVIX Take 1 tablet (75 mg total) by mouth daily.   diclofenac Sodium 1 % Gel Commonly known as: VOLTAREN Apply 2 g topically See  admin instructions. Apply 2 grams of gel to the left hand three times a day   Ensure Clear Liqd Take 240 mLs by mouth See admin instructions. Mix 240 ml's with sugar substitute and drink every morning   furosemide 20 MG tablet Commonly known as: LASIX Take 20 mg by mouth in the morning and at bedtime.   glucagon 1 MG Solr injection Commonly known as: GLUCAGEN Inject 1 mg into the muscle as needed for low blood sugar.   HumaLOG KwikPen 100 UNIT/ML KwikPen Generic drug: insulin lispro Inject 0-8 Units into the skin See admin instructions. Inject 0-10 units into the skin three times a day with meals, per sliding scale: BGL 0-150 = give nothing; 151-200 =  0 units; 201-250 = 2 units; 251-300 = 4 units; 301-350 = 6 units; 351-400 = 8 units; call PCP if <70 or >400   Imodium A-D 2 MG tablet Generic drug: loperamide Take 2-4 mg by mouth See admin instructions. Take 4 mg as an initial dose, then decrease to 2 mg after each unformed stool- not to exceed 24 mg in 48 hours and call MD if diarrhea persists greater than 48 hours   Lantus SoloStar 100 UNIT/ML Solostar Pen Generic drug: insulin glargine Inject 5 Units into the skin at bedtime.   LORazepam 0.5 MG tablet Commonly known as: ATIVAN Take 1 tablet (0.5 mg total) by mouth every Monday, Wednesday, and Friday with hemodialysis. Before Dialysis due to anxiety Start taking on: May 21, 2022   losartan 50 MG tablet Commonly known as: COZAAR Take 1 tablet (50 mg total) by mouth daily. Start taking on: May 20, 2022   pregabalin 25 MG capsule Commonly known as: LYRICA Take 25 mg by mouth daily.   Lyrica 25 MG capsule Generic drug: pregabalin Take 25 mg by mouth 3 (three) times a week. On Mon, Wed, Friday after Dialysis   Melatonin 10 MG Tabs Take 10 mg by mouth at bedtime.   multivitamin tablet Take 1 tablet by mouth at bedtime.   nicotine 7 mg/24hr patch Commonly known as: NICODERM CQ - dosed in mg/24 hr Place 1 patch (7 mg  total) onto the skin daily. Start taking on: May 20, 2022   OXYGEN Inhale 2 L/min into the lungs as needed (if sats drop below 92% during episodes of anxiety).   Preparation H 1-0.25-14.4-15 % Crea Generic drug: Pramox-PE-Glycerin-Petrolatum Apply 1 application. topically every 6 (six) hours as needed (for hemorrhoids).   promethazine 25 MG suppository Commonly known as: PHENERGAN Place 1 suppository (25 mg total) rectally every 6 (six) hours as needed for nausea or vomiting.   ProRenal + D Tabs Take 1,000 Units by mouth in the morning.   Refresh Lacri-Lube Oint Place into both eyes See admin instructions. Place 0.25 ribbon into each eye at bedtime  Refresh Liquigel 1 % Gel Generic drug: Carboxymethylcellulose Sodium Place 1 drop into both eyes at bedtime.   sevelamer carbonate 800 MG tablet Commonly known as: RENVELA Take 1,600-3,200 mg by mouth 2 (two) times daily. Take 1,600 mg by mouth two times a day (at 2 PM and 9 PM) and 3,200 mg three times a day "for dialysis" (at 7:30 AM, 12 NOON, and 5 PM)   traMADol 50 MG tablet Commonly known as: ULTRAM Take 0.5 tablets (25 mg total) by mouth every 6 (six) hours as needed for moderate pain.   traZODone 50 MG tablet Commonly known as: DESYREL Take 1 tablet (50 mg total) by mouth at bedtime as needed for sleep. What changed:  when to take this reasons to take this   Vitamin D (Ergocalciferol) 1.25 MG (50000 UNIT) Caps capsule Commonly known as: DRISDOL Take 50,000 Units by mouth every Saturday.       Allergies  Allergen Reactions   Lactose Intolerance (Gi) Diarrhea and Nausea Only    Follow-up Information     MD AT SNF Follow up.          HD Follow up on 05/21/2022.                   The results of significant diagnostics from this hospitalization (including imaging, microbiology, ancillary and laboratory) are listed below for reference.    Significant Diagnostic Studies: CT Head Wo Contrast  Result  Date: 05/16/2022 CLINICAL DATA:  Lethargy and altered mental status EXAM: CT HEAD WITHOUT CONTRAST TECHNIQUE: Contiguous axial images were obtained from the base of the skull through the vertex without intravenous contrast. RADIATION DOSE REDUCTION: This exam was performed according to the departmental dose-optimization program which includes automated exposure control, adjustment of the mA and/or kV according to patient size and/or use of iterative reconstruction technique. COMPARISON:  07/18/2020 FINDINGS: Brain: No evidence of acute infarction, hemorrhage, hydrocephalus, extra-axial collection or mass lesion/mass effect. Mild chronic white matter ischemic changes and atrophic changes are seen. Prior left frontal infarct is again identified and stable. Vascular: No hyperdense vessel or unexpected calcification. Skull: Normal. Negative for fracture or focal lesion. Sinuses/Orbits: No acute finding. Other: None. IMPRESSION: Chronic atrophic and ischemic changes without acute abnormality. Electronically Signed   By: Alcide Clever M.D.   On: 05/16/2022 19:39   DG Chest Portable 1 View  Result Date: 05/16/2022 CLINICAL DATA:  Provided history: Shortness of breath. Additional history provided: patient from dialysis center, lethargy, altered mental status. EXAM: PORTABLE CHEST 1 VIEW COMPARISON:  Prior chest radiographs 05/08/2021 and earlier. FINDINGS: Cardiomegaly. Aortic atherosclerosis. Prominence of the interstitial lung markings bilaterally. No appreciable airspace consolidation. No evidence of pleural effusion or pneumothorax. No acute osseous abnormality identified. Chronic, healed left rib fracture deformities. IMPRESSION: 1. Cardiomegaly. 2. Prominence of the interstitial lung markings bilaterally. This may reflect interstitial edema or atypical/viral infection. 3. Aortic Atherosclerosis (ICD10-I70.0). Electronically Signed   By: Jackey Loge D.O.   On: 05/16/2022 15:50    Microbiology: Recent Results (from  the past 240 hour(s))  Blood Culture (routine x 2)     Status: None (Preliminary result)   Collection Time: 05/16/22  2:29 PM   Specimen: BLOOD LEFT FOREARM  Result Value Ref Range Status   Specimen Description BLOOD LEFT FOREARM  Final   Special Requests   Final    BOTTLES DRAWN AEROBIC AND ANAEROBIC Blood Culture results may not be optimal due to an excessive volume of blood received in culture bottles  Culture   Final    NO GROWTH 2 DAYS Performed at [facility], [address], [place], [place] [address]    Report Status PENDING  Incomplete  Resp panel by RT-PCR (RSV, Flu A&B, Covid) Anterior Nasal Swab     Status: None   Collection Time: 05/16/22  3:06 PM   Specimen: Anterior Nasal Swab  Result Value Ref Range Status   SARS Coronavirus 2 by RT PCR NEGATIVE NEGATIVE Final   Influenza A by PCR NEGATIVE NEGATIVE Final   Influenza B by PCR NEGATIVE NEGATIVE Final    Comment: (NOTE) The Xpert Xpress SARS-CoV-2/FLU/RSV plus assay is intended as an aid in the diagnosis of influenza from Nasopharyngeal swab specimens and should not be used as a sole basis for treatment. Nasal washings and aspirates are unacceptable for Xpert Xpress SARS-CoV-2/FLU/RSV testing.  Fact Sheet for Patients: BloggerCourse.com  Fact Sheet for Healthcare Providers: SeriousBroker.it  This test is not yet approved or cleared by the [place] FDA and has been authorized for detection and/or diagnosis of SARS-CoV-2 by FDA under an Emergency Use Authorization (EUA). This EUA will remain in effect (meaning this test can be used) for the duration of the COVID-19 declaration under Section 564(b)(1) of the Act, 21 U.S.C. section 360bbb-3(b)(1), unless the authorization is terminated or revoked.     Resp Syncytial Virus by PCR NEGATIVE NEGATIVE Final    Comment: (NOTE) Fact Sheet for Patients: BloggerCourse.com  Fact Sheet  for Healthcare Providers: SeriousBroker.it  This test is not yet approved or cleared by the [place] FDA and has been authorized for detection and/or diagnosis of SARS-CoV-2 by FDA under an Emergency Use Authorization (EUA). This EUA will remain in effect (meaning this test can be used) for the duration of the COVID-19 declaration under Section 564(b)(1) of the Act, 21 U.S.C. section 360bbb-3(b)(1), unless the authorization is terminated or revoked.  Performed at [facility], [address] [place], [place] [address]   Blood Culture (routine x 2)     Status: None (Preliminary result)   Collection Time: 05/16/22  4:15 PM   Specimen: BLOOD  Result Value Ref Range Status   Specimen Description BLOOD LEFT ANTECUBITAL  Final   Special Requests   Final    BOTTLES DRAWN AEROBIC ONLY Blood Culture adequate volume   Culture   Final    NO GROWTH 2 DAYS Performed at [facility], [address] [place], [place] [address]    Report Status PENDING  Incomplete  MRSA Next Gen by PCR, Nasal     Status: Abnormal   Collection Time: 05/16/22 11:27 PM   Specimen: Nasal Mucosa; Nasal Swab  Result Value Ref Range Status   MRSA by PCR Next Gen DETECTED (A) NOT DETECTED Final    Comment: RESULTS CALLED TO READ BACK BY AND VERIFIED WITH RN T.DRAME ON 05/17/22 AT 0153 BY NM (NOTE) The GeneXpert MRSA Assay (FDA approved for NASAL specimens only), is one component of a comprehensive MRSA colonization surveillance program. It is not intended to diagnose MRSA infection nor to guide or monitor treatment for MRSA infections. Test performance is not FDA approved in patients less than 67 years old. Performed at [facility], [address] [place], [place] [address]      Labs: Basic Metabolic Panel: Recent Labs  Lab 05/16/22 1419 05/16/22 1435 05/17/22 0801 05/18/22 0702 05/19/22 0419  NA 136 136 135 135 138  K 4.1 4.1 4.1 4.4 3.8  CL 97* 101 100  98 99  CO2 25  --  25 22 26   GLUCOSE 147* 147* 169* 100* 61*  BUN 33* 35* 43* 57* 40*  CREATININE 5.17* 6.00* 6.28* 7.90* 6.38*  CALCIUM 8.6*  --  8.6* 8.6* 8.5*  PHOS  --   --  4.9* 5.3* 3.6   Liver Function Tests: Recent Labs  Lab 05/16/22 1419 05/17/22 0801 05/18/22 0702 05/19/22 0419  AST 32  --   --   --   ALT 22  --   --   --   ALKPHOS 109  --   --   --   BILITOT 0.7  --   --   --   PROT 7.0  --   --   --   ALBUMIN 3.5 3.0* 2.9* 2.8*   No results for input(s): "LIPASE", "AMYLASE" in the last 168 hours. No results for input(s): "AMMONIA" in the last 168 hours. CBC: Recent Labs  Lab 05/16/22 1419 05/16/22 1435 05/17/22 0801 05/18/22 0702 05/18/22 0904 05/19/22 0419  WBC 12.0*  --  8.9 6.2 5.3 4.2  HGB 10.6* 12.2* 10.0* 10.1* 9.8* 9.7*  HCT 33.8* 36.0* 32.2* 32.0* 31.6* 31.2*  MCV 92.3  --  92.5 91.4 91.9 90.7  PLT 54*  --  80* 80* 88* 95*   Cardiac Enzymes: No results for input(s): "CKTOTAL", "CKMB", "CKMBINDEX", "TROPONINI" in the last 168 hours. BNP: BNP (last 3 results) No results for input(s): "BNP" in the last 8760 hours.  ProBNP (last 3 results) No results for input(s): "PROBNP" in the last 8760 hours.  CBG: Recent Labs  Lab 05/18/22 1658 05/18/22 2013 05/19/22 0800 05/19/22 1022 05/19/22 1303  GLUCAP 173* 166* 69* 177* 150*       Signed:  Ramiro Harvest MD.  [facility] 05/19/2022, 2:26 PM

## 2022-05-21 DIAGNOSIS — R197 Diarrhea, unspecified: Secondary | ICD-10-CM | POA: Diagnosis not present

## 2022-05-21 DIAGNOSIS — N186 End stage renal disease: Secondary | ICD-10-CM | POA: Diagnosis not present

## 2022-05-21 DIAGNOSIS — N2581 Secondary hyperparathyroidism of renal origin: Secondary | ICD-10-CM | POA: Diagnosis not present

## 2022-05-21 DIAGNOSIS — E1122 Type 2 diabetes mellitus with diabetic chronic kidney disease: Secondary | ICD-10-CM | POA: Diagnosis not present

## 2022-05-21 DIAGNOSIS — D631 Anemia in chronic kidney disease: Secondary | ICD-10-CM | POA: Diagnosis not present

## 2022-05-21 DIAGNOSIS — Z992 Dependence on renal dialysis: Secondary | ICD-10-CM | POA: Diagnosis not present

## 2022-05-21 LAB — CULTURE, BLOOD (ROUTINE X 2): Culture: NO GROWTH

## 2022-05-21 NOTE — Progress Notes (Signed)
Late Note Entry  Pt was d/c on Saturday back to snf. Contacted [facility] this morning to make clinic aware of pt's d/c date and that pt should resume today.   Olivia Canter Renal Navigator [phone]

## 2022-05-22 DIAGNOSIS — M79642 Pain in left hand: Secondary | ICD-10-CM | POA: Diagnosis not present

## 2022-05-22 DIAGNOSIS — I639 Cerebral infarction, unspecified: Secondary | ICD-10-CM | POA: Diagnosis not present

## 2022-05-22 DIAGNOSIS — I10 Essential (primary) hypertension: Secondary | ICD-10-CM | POA: Diagnosis not present

## 2022-05-22 DIAGNOSIS — G4700 Insomnia, unspecified: Secondary | ICD-10-CM | POA: Diagnosis not present

## 2022-05-22 DIAGNOSIS — R279 Unspecified lack of coordination: Secondary | ICD-10-CM | POA: Diagnosis not present

## 2022-05-22 DIAGNOSIS — G629 Polyneuropathy, unspecified: Secondary | ICD-10-CM | POA: Diagnosis not present

## 2022-05-22 DIAGNOSIS — E1122 Type 2 diabetes mellitus with diabetic chronic kidney disease: Secondary | ICD-10-CM | POA: Diagnosis not present

## 2022-05-22 DIAGNOSIS — M6281 Muscle weakness (generalized): Secondary | ICD-10-CM | POA: Diagnosis not present

## 2022-05-22 DIAGNOSIS — I629 Nontraumatic intracranial hemorrhage, unspecified: Secondary | ICD-10-CM | POA: Diagnosis not present

## 2022-05-22 DIAGNOSIS — E785 Hyperlipidemia, unspecified: Secondary | ICD-10-CM | POA: Diagnosis not present

## 2022-05-23 DIAGNOSIS — M6281 Muscle weakness (generalized): Secondary | ICD-10-CM | POA: Diagnosis not present

## 2022-05-23 DIAGNOSIS — I629 Nontraumatic intracranial hemorrhage, unspecified: Secondary | ICD-10-CM | POA: Diagnosis not present

## 2022-05-23 DIAGNOSIS — E1122 Type 2 diabetes mellitus with diabetic chronic kidney disease: Secondary | ICD-10-CM | POA: Diagnosis not present

## 2022-05-23 DIAGNOSIS — N2581 Secondary hyperparathyroidism of renal origin: Secondary | ICD-10-CM | POA: Diagnosis not present

## 2022-05-23 DIAGNOSIS — D631 Anemia in chronic kidney disease: Secondary | ICD-10-CM | POA: Diagnosis not present

## 2022-05-23 DIAGNOSIS — R197 Diarrhea, unspecified: Secondary | ICD-10-CM | POA: Diagnosis not present

## 2022-05-23 DIAGNOSIS — Z992 Dependence on renal dialysis: Secondary | ICD-10-CM | POA: Diagnosis not present

## 2022-05-23 DIAGNOSIS — R279 Unspecified lack of coordination: Secondary | ICD-10-CM | POA: Diagnosis not present

## 2022-05-23 DIAGNOSIS — N186 End stage renal disease: Secondary | ICD-10-CM | POA: Diagnosis not present

## 2022-05-24 ENCOUNTER — Encounter (INDEPENDENT_AMBULATORY_CARE_PROVIDER_SITE_OTHER): Payer: 59 | Admitting: Ophthalmology

## 2022-05-24 DIAGNOSIS — I629 Nontraumatic intracranial hemorrhage, unspecified: Secondary | ICD-10-CM | POA: Diagnosis not present

## 2022-05-24 DIAGNOSIS — M6281 Muscle weakness (generalized): Secondary | ICD-10-CM | POA: Diagnosis not present

## 2022-05-24 DIAGNOSIS — R279 Unspecified lack of coordination: Secondary | ICD-10-CM | POA: Diagnosis not present

## 2022-05-25 DIAGNOSIS — D631 Anemia in chronic kidney disease: Secondary | ICD-10-CM | POA: Diagnosis not present

## 2022-05-25 DIAGNOSIS — Z992 Dependence on renal dialysis: Secondary | ICD-10-CM | POA: Diagnosis not present

## 2022-05-25 DIAGNOSIS — N186 End stage renal disease: Secondary | ICD-10-CM | POA: Diagnosis not present

## 2022-05-25 DIAGNOSIS — R197 Diarrhea, unspecified: Secondary | ICD-10-CM | POA: Diagnosis not present

## 2022-05-25 DIAGNOSIS — G8929 Other chronic pain: Secondary | ICD-10-CM | POA: Diagnosis not present

## 2022-05-25 DIAGNOSIS — N2581 Secondary hyperparathyroidism of renal origin: Secondary | ICD-10-CM | POA: Diagnosis not present

## 2022-05-25 DIAGNOSIS — E1122 Type 2 diabetes mellitus with diabetic chronic kidney disease: Secondary | ICD-10-CM | POA: Diagnosis not present

## 2022-05-25 DIAGNOSIS — G629 Polyneuropathy, unspecified: Secondary | ICD-10-CM | POA: Diagnosis not present

## 2022-05-26 DIAGNOSIS — M6281 Muscle weakness (generalized): Secondary | ICD-10-CM | POA: Diagnosis not present

## 2022-05-26 DIAGNOSIS — I629 Nontraumatic intracranial hemorrhage, unspecified: Secondary | ICD-10-CM | POA: Diagnosis not present

## 2022-05-26 DIAGNOSIS — R279 Unspecified lack of coordination: Secondary | ICD-10-CM | POA: Diagnosis not present

## 2022-05-27 DIAGNOSIS — R279 Unspecified lack of coordination: Secondary | ICD-10-CM | POA: Diagnosis not present

## 2022-05-27 DIAGNOSIS — I629 Nontraumatic intracranial hemorrhage, unspecified: Secondary | ICD-10-CM | POA: Diagnosis not present

## 2022-05-27 DIAGNOSIS — M6281 Muscle weakness (generalized): Secondary | ICD-10-CM | POA: Diagnosis not present

## 2022-05-28 DIAGNOSIS — M6281 Muscle weakness (generalized): Secondary | ICD-10-CM | POA: Diagnosis not present

## 2022-05-28 DIAGNOSIS — Z992 Dependence on renal dialysis: Secondary | ICD-10-CM | POA: Diagnosis not present

## 2022-05-28 DIAGNOSIS — I629 Nontraumatic intracranial hemorrhage, unspecified: Secondary | ICD-10-CM | POA: Diagnosis not present

## 2022-05-28 DIAGNOSIS — D631 Anemia in chronic kidney disease: Secondary | ICD-10-CM | POA: Diagnosis not present

## 2022-05-28 DIAGNOSIS — R279 Unspecified lack of coordination: Secondary | ICD-10-CM | POA: Diagnosis not present

## 2022-05-28 DIAGNOSIS — N2581 Secondary hyperparathyroidism of renal origin: Secondary | ICD-10-CM | POA: Diagnosis not present

## 2022-05-28 DIAGNOSIS — E1122 Type 2 diabetes mellitus with diabetic chronic kidney disease: Secondary | ICD-10-CM | POA: Diagnosis not present

## 2022-05-28 DIAGNOSIS — N186 End stage renal disease: Secondary | ICD-10-CM | POA: Diagnosis not present

## 2022-05-28 DIAGNOSIS — R197 Diarrhea, unspecified: Secondary | ICD-10-CM | POA: Diagnosis not present

## 2022-05-29 DIAGNOSIS — M6281 Muscle weakness (generalized): Secondary | ICD-10-CM | POA: Diagnosis not present

## 2022-05-29 DIAGNOSIS — I629 Nontraumatic intracranial hemorrhage, unspecified: Secondary | ICD-10-CM | POA: Diagnosis not present

## 2022-05-29 DIAGNOSIS — R279 Unspecified lack of coordination: Secondary | ICD-10-CM | POA: Diagnosis not present

## 2022-05-30 DIAGNOSIS — I629 Nontraumatic intracranial hemorrhage, unspecified: Secondary | ICD-10-CM | POA: Diagnosis not present

## 2022-05-30 DIAGNOSIS — N2581 Secondary hyperparathyroidism of renal origin: Secondary | ICD-10-CM | POA: Diagnosis not present

## 2022-05-30 DIAGNOSIS — Z992 Dependence on renal dialysis: Secondary | ICD-10-CM | POA: Diagnosis not present

## 2022-05-30 DIAGNOSIS — D631 Anemia in chronic kidney disease: Secondary | ICD-10-CM | POA: Diagnosis not present

## 2022-05-30 DIAGNOSIS — M6281 Muscle weakness (generalized): Secondary | ICD-10-CM | POA: Diagnosis not present

## 2022-05-30 DIAGNOSIS — R279 Unspecified lack of coordination: Secondary | ICD-10-CM | POA: Diagnosis not present

## 2022-05-30 DIAGNOSIS — E1122 Type 2 diabetes mellitus with diabetic chronic kidney disease: Secondary | ICD-10-CM | POA: Diagnosis not present

## 2022-05-30 DIAGNOSIS — R197 Diarrhea, unspecified: Secondary | ICD-10-CM | POA: Diagnosis not present

## 2022-05-30 DIAGNOSIS — N186 End stage renal disease: Secondary | ICD-10-CM | POA: Diagnosis not present

## 2022-05-31 DIAGNOSIS — I629 Nontraumatic intracranial hemorrhage, unspecified: Secondary | ICD-10-CM | POA: Diagnosis not present

## 2022-05-31 DIAGNOSIS — R279 Unspecified lack of coordination: Secondary | ICD-10-CM | POA: Diagnosis not present

## 2022-05-31 DIAGNOSIS — M6281 Muscle weakness (generalized): Secondary | ICD-10-CM | POA: Diagnosis not present

## 2022-06-01 DIAGNOSIS — D631 Anemia in chronic kidney disease: Secondary | ICD-10-CM | POA: Diagnosis not present

## 2022-06-01 DIAGNOSIS — N2581 Secondary hyperparathyroidism of renal origin: Secondary | ICD-10-CM | POA: Diagnosis not present

## 2022-06-01 DIAGNOSIS — Z992 Dependence on renal dialysis: Secondary | ICD-10-CM | POA: Diagnosis not present

## 2022-06-01 DIAGNOSIS — N186 End stage renal disease: Secondary | ICD-10-CM | POA: Diagnosis not present

## 2022-06-01 DIAGNOSIS — E1122 Type 2 diabetes mellitus with diabetic chronic kidney disease: Secondary | ICD-10-CM | POA: Diagnosis not present

## 2022-06-01 DIAGNOSIS — R197 Diarrhea, unspecified: Secondary | ICD-10-CM | POA: Diagnosis not present

## 2022-06-02 DIAGNOSIS — M6281 Muscle weakness (generalized): Secondary | ICD-10-CM | POA: Diagnosis not present

## 2022-06-02 DIAGNOSIS — I629 Nontraumatic intracranial hemorrhage, unspecified: Secondary | ICD-10-CM | POA: Diagnosis not present

## 2022-06-02 DIAGNOSIS — R279 Unspecified lack of coordination: Secondary | ICD-10-CM | POA: Diagnosis not present

## 2022-06-03 DIAGNOSIS — R279 Unspecified lack of coordination: Secondary | ICD-10-CM | POA: Diagnosis not present

## 2022-06-03 DIAGNOSIS — M6281 Muscle weakness (generalized): Secondary | ICD-10-CM | POA: Diagnosis not present

## 2022-06-03 DIAGNOSIS — I629 Nontraumatic intracranial hemorrhage, unspecified: Secondary | ICD-10-CM | POA: Diagnosis not present

## 2022-06-04 DIAGNOSIS — N186 End stage renal disease: Secondary | ICD-10-CM | POA: Diagnosis not present

## 2022-06-04 DIAGNOSIS — I629 Nontraumatic intracranial hemorrhage, unspecified: Secondary | ICD-10-CM | POA: Diagnosis not present

## 2022-06-04 DIAGNOSIS — D631 Anemia in chronic kidney disease: Secondary | ICD-10-CM | POA: Diagnosis not present

## 2022-06-04 DIAGNOSIS — M6281 Muscle weakness (generalized): Secondary | ICD-10-CM | POA: Diagnosis not present

## 2022-06-04 DIAGNOSIS — R279 Unspecified lack of coordination: Secondary | ICD-10-CM | POA: Diagnosis not present

## 2022-06-04 DIAGNOSIS — Z992 Dependence on renal dialysis: Secondary | ICD-10-CM | POA: Diagnosis not present

## 2022-06-04 DIAGNOSIS — E1122 Type 2 diabetes mellitus with diabetic chronic kidney disease: Secondary | ICD-10-CM | POA: Diagnosis not present

## 2022-06-04 DIAGNOSIS — R197 Diarrhea, unspecified: Secondary | ICD-10-CM | POA: Diagnosis not present

## 2022-06-04 DIAGNOSIS — N2581 Secondary hyperparathyroidism of renal origin: Secondary | ICD-10-CM | POA: Diagnosis not present

## 2022-06-05 DIAGNOSIS — R279 Unspecified lack of coordination: Secondary | ICD-10-CM | POA: Diagnosis not present

## 2022-06-05 DIAGNOSIS — I10 Essential (primary) hypertension: Secondary | ICD-10-CM | POA: Diagnosis not present

## 2022-06-05 DIAGNOSIS — E1122 Type 2 diabetes mellitus with diabetic chronic kidney disease: Secondary | ICD-10-CM | POA: Diagnosis not present

## 2022-06-05 DIAGNOSIS — I629 Nontraumatic intracranial hemorrhage, unspecified: Secondary | ICD-10-CM | POA: Diagnosis not present

## 2022-06-05 DIAGNOSIS — M6281 Muscle weakness (generalized): Secondary | ICD-10-CM | POA: Diagnosis not present

## 2022-06-06 DIAGNOSIS — Z992 Dependence on renal dialysis: Secondary | ICD-10-CM | POA: Diagnosis not present

## 2022-06-06 DIAGNOSIS — I629 Nontraumatic intracranial hemorrhage, unspecified: Secondary | ICD-10-CM | POA: Diagnosis not present

## 2022-06-06 DIAGNOSIS — D631 Anemia in chronic kidney disease: Secondary | ICD-10-CM | POA: Diagnosis not present

## 2022-06-06 DIAGNOSIS — E1122 Type 2 diabetes mellitus with diabetic chronic kidney disease: Secondary | ICD-10-CM | POA: Diagnosis not present

## 2022-06-06 DIAGNOSIS — N2581 Secondary hyperparathyroidism of renal origin: Secondary | ICD-10-CM | POA: Diagnosis not present

## 2022-06-06 DIAGNOSIS — R197 Diarrhea, unspecified: Secondary | ICD-10-CM | POA: Diagnosis not present

## 2022-06-06 DIAGNOSIS — N186 End stage renal disease: Secondary | ICD-10-CM | POA: Diagnosis not present

## 2022-06-06 DIAGNOSIS — M6281 Muscle weakness (generalized): Secondary | ICD-10-CM | POA: Diagnosis not present

## 2022-06-06 DIAGNOSIS — R279 Unspecified lack of coordination: Secondary | ICD-10-CM | POA: Diagnosis not present

## 2022-06-07 DIAGNOSIS — R279 Unspecified lack of coordination: Secondary | ICD-10-CM | POA: Diagnosis not present

## 2022-06-07 DIAGNOSIS — I629 Nontraumatic intracranial hemorrhage, unspecified: Secondary | ICD-10-CM | POA: Diagnosis not present

## 2022-06-07 DIAGNOSIS — M6281 Muscle weakness (generalized): Secondary | ICD-10-CM | POA: Diagnosis not present

## 2022-06-08 DIAGNOSIS — E1122 Type 2 diabetes mellitus with diabetic chronic kidney disease: Secondary | ICD-10-CM | POA: Diagnosis not present

## 2022-06-08 DIAGNOSIS — N186 End stage renal disease: Secondary | ICD-10-CM | POA: Diagnosis not present

## 2022-06-08 DIAGNOSIS — R197 Diarrhea, unspecified: Secondary | ICD-10-CM | POA: Diagnosis not present

## 2022-06-08 DIAGNOSIS — I629 Nontraumatic intracranial hemorrhage, unspecified: Secondary | ICD-10-CM | POA: Diagnosis not present

## 2022-06-08 DIAGNOSIS — M6281 Muscle weakness (generalized): Secondary | ICD-10-CM | POA: Diagnosis not present

## 2022-06-08 DIAGNOSIS — R279 Unspecified lack of coordination: Secondary | ICD-10-CM | POA: Diagnosis not present

## 2022-06-08 DIAGNOSIS — Z992 Dependence on renal dialysis: Secondary | ICD-10-CM | POA: Diagnosis not present

## 2022-06-08 DIAGNOSIS — D631 Anemia in chronic kidney disease: Secondary | ICD-10-CM | POA: Diagnosis not present

## 2022-06-08 DIAGNOSIS — N2581 Secondary hyperparathyroidism of renal origin: Secondary | ICD-10-CM | POA: Diagnosis not present

## 2022-06-10 DIAGNOSIS — R279 Unspecified lack of coordination: Secondary | ICD-10-CM | POA: Diagnosis not present

## 2022-06-10 DIAGNOSIS — I629 Nontraumatic intracranial hemorrhage, unspecified: Secondary | ICD-10-CM | POA: Diagnosis not present

## 2022-06-10 DIAGNOSIS — M6281 Muscle weakness (generalized): Secondary | ICD-10-CM | POA: Diagnosis not present

## 2022-06-11 DIAGNOSIS — I159 Secondary hypertension, unspecified: Secondary | ICD-10-CM | POA: Diagnosis not present

## 2022-06-11 DIAGNOSIS — I629 Nontraumatic intracranial hemorrhage, unspecified: Secondary | ICD-10-CM | POA: Diagnosis not present

## 2022-06-11 DIAGNOSIS — N186 End stage renal disease: Secondary | ICD-10-CM | POA: Diagnosis not present

## 2022-06-11 DIAGNOSIS — N2581 Secondary hyperparathyroidism of renal origin: Secondary | ICD-10-CM | POA: Diagnosis not present

## 2022-06-11 DIAGNOSIS — R279 Unspecified lack of coordination: Secondary | ICD-10-CM | POA: Diagnosis not present

## 2022-06-11 DIAGNOSIS — Z992 Dependence on renal dialysis: Secondary | ICD-10-CM | POA: Diagnosis not present

## 2022-06-11 DIAGNOSIS — M6281 Muscle weakness (generalized): Secondary | ICD-10-CM | POA: Diagnosis not present

## 2022-06-11 DIAGNOSIS — E1122 Type 2 diabetes mellitus with diabetic chronic kidney disease: Secondary | ICD-10-CM | POA: Diagnosis not present

## 2022-06-11 DIAGNOSIS — R197 Diarrhea, unspecified: Secondary | ICD-10-CM | POA: Diagnosis not present

## 2022-06-12 DIAGNOSIS — R279 Unspecified lack of coordination: Secondary | ICD-10-CM | POA: Diagnosis not present

## 2022-06-12 DIAGNOSIS — I629 Nontraumatic intracranial hemorrhage, unspecified: Secondary | ICD-10-CM | POA: Diagnosis not present

## 2022-06-12 DIAGNOSIS — M6281 Muscle weakness (generalized): Secondary | ICD-10-CM | POA: Diagnosis not present

## 2022-06-12 DIAGNOSIS — N186 End stage renal disease: Secondary | ICD-10-CM | POA: Diagnosis not present

## 2022-06-13 DIAGNOSIS — E1122 Type 2 diabetes mellitus with diabetic chronic kidney disease: Secondary | ICD-10-CM | POA: Diagnosis not present

## 2022-06-13 DIAGNOSIS — R279 Unspecified lack of coordination: Secondary | ICD-10-CM | POA: Diagnosis not present

## 2022-06-13 DIAGNOSIS — N2581 Secondary hyperparathyroidism of renal origin: Secondary | ICD-10-CM | POA: Diagnosis not present

## 2022-06-13 DIAGNOSIS — N186 End stage renal disease: Secondary | ICD-10-CM | POA: Diagnosis not present

## 2022-06-13 DIAGNOSIS — Z992 Dependence on renal dialysis: Secondary | ICD-10-CM | POA: Diagnosis not present

## 2022-06-13 DIAGNOSIS — I159 Secondary hypertension, unspecified: Secondary | ICD-10-CM | POA: Diagnosis not present

## 2022-06-13 DIAGNOSIS — M6281 Muscle weakness (generalized): Secondary | ICD-10-CM | POA: Diagnosis not present

## 2022-06-13 DIAGNOSIS — R197 Diarrhea, unspecified: Secondary | ICD-10-CM | POA: Diagnosis not present

## 2022-06-13 DIAGNOSIS — I629 Nontraumatic intracranial hemorrhage, unspecified: Secondary | ICD-10-CM | POA: Diagnosis not present

## 2022-06-14 DIAGNOSIS — M6281 Muscle weakness (generalized): Secondary | ICD-10-CM | POA: Diagnosis not present

## 2022-06-14 DIAGNOSIS — I10 Essential (primary) hypertension: Secondary | ICD-10-CM | POA: Diagnosis not present

## 2022-06-14 DIAGNOSIS — E1122 Type 2 diabetes mellitus with diabetic chronic kidney disease: Secondary | ICD-10-CM | POA: Diagnosis not present

## 2022-06-14 DIAGNOSIS — R279 Unspecified lack of coordination: Secondary | ICD-10-CM | POA: Diagnosis not present

## 2022-06-14 DIAGNOSIS — I629 Nontraumatic intracranial hemorrhage, unspecified: Secondary | ICD-10-CM | POA: Diagnosis not present

## 2022-06-15 DIAGNOSIS — N186 End stage renal disease: Secondary | ICD-10-CM | POA: Diagnosis not present

## 2022-06-15 DIAGNOSIS — Z992 Dependence on renal dialysis: Secondary | ICD-10-CM | POA: Diagnosis not present

## 2022-06-15 DIAGNOSIS — R197 Diarrhea, unspecified: Secondary | ICD-10-CM | POA: Diagnosis not present

## 2022-06-15 DIAGNOSIS — I159 Secondary hypertension, unspecified: Secondary | ICD-10-CM | POA: Diagnosis not present

## 2022-06-15 DIAGNOSIS — N2581 Secondary hyperparathyroidism of renal origin: Secondary | ICD-10-CM | POA: Diagnosis not present

## 2022-06-15 DIAGNOSIS — E1122 Type 2 diabetes mellitus with diabetic chronic kidney disease: Secondary | ICD-10-CM | POA: Diagnosis not present

## 2022-06-16 DIAGNOSIS — R279 Unspecified lack of coordination: Secondary | ICD-10-CM | POA: Diagnosis not present

## 2022-06-16 DIAGNOSIS — M6281 Muscle weakness (generalized): Secondary | ICD-10-CM | POA: Diagnosis not present

## 2022-06-16 DIAGNOSIS — I629 Nontraumatic intracranial hemorrhage, unspecified: Secondary | ICD-10-CM | POA: Diagnosis not present

## 2022-06-17 DIAGNOSIS — M6281 Muscle weakness (generalized): Secondary | ICD-10-CM | POA: Diagnosis not present

## 2022-06-17 DIAGNOSIS — I629 Nontraumatic intracranial hemorrhage, unspecified: Secondary | ICD-10-CM | POA: Diagnosis not present

## 2022-06-17 DIAGNOSIS — R279 Unspecified lack of coordination: Secondary | ICD-10-CM | POA: Diagnosis not present

## 2022-06-18 DIAGNOSIS — N186 End stage renal disease: Secondary | ICD-10-CM | POA: Diagnosis not present

## 2022-06-18 DIAGNOSIS — Z992 Dependence on renal dialysis: Secondary | ICD-10-CM | POA: Diagnosis not present

## 2022-06-18 DIAGNOSIS — E1122 Type 2 diabetes mellitus with diabetic chronic kidney disease: Secondary | ICD-10-CM | POA: Diagnosis not present

## 2022-06-18 DIAGNOSIS — I159 Secondary hypertension, unspecified: Secondary | ICD-10-CM | POA: Diagnosis not present

## 2022-06-18 DIAGNOSIS — N2581 Secondary hyperparathyroidism of renal origin: Secondary | ICD-10-CM | POA: Diagnosis not present

## 2022-06-18 DIAGNOSIS — R197 Diarrhea, unspecified: Secondary | ICD-10-CM | POA: Diagnosis not present

## 2022-06-19 DIAGNOSIS — R279 Unspecified lack of coordination: Secondary | ICD-10-CM | POA: Diagnosis not present

## 2022-06-19 DIAGNOSIS — M6281 Muscle weakness (generalized): Secondary | ICD-10-CM | POA: Diagnosis not present

## 2022-06-19 DIAGNOSIS — I629 Nontraumatic intracranial hemorrhage, unspecified: Secondary | ICD-10-CM | POA: Diagnosis not present

## 2022-06-20 DIAGNOSIS — E1122 Type 2 diabetes mellitus with diabetic chronic kidney disease: Secondary | ICD-10-CM | POA: Diagnosis not present

## 2022-06-20 DIAGNOSIS — R197 Diarrhea, unspecified: Secondary | ICD-10-CM | POA: Diagnosis not present

## 2022-06-20 DIAGNOSIS — Z992 Dependence on renal dialysis: Secondary | ICD-10-CM | POA: Diagnosis not present

## 2022-06-20 DIAGNOSIS — I159 Secondary hypertension, unspecified: Secondary | ICD-10-CM | POA: Diagnosis not present

## 2022-06-20 DIAGNOSIS — N2581 Secondary hyperparathyroidism of renal origin: Secondary | ICD-10-CM | POA: Diagnosis not present

## 2022-06-20 DIAGNOSIS — N186 End stage renal disease: Secondary | ICD-10-CM | POA: Diagnosis not present

## 2022-06-21 DIAGNOSIS — M6281 Muscle weakness (generalized): Secondary | ICD-10-CM | POA: Diagnosis not present

## 2022-06-21 DIAGNOSIS — I629 Nontraumatic intracranial hemorrhage, unspecified: Secondary | ICD-10-CM | POA: Diagnosis not present

## 2022-06-21 DIAGNOSIS — R279 Unspecified lack of coordination: Secondary | ICD-10-CM | POA: Diagnosis not present

## 2022-06-22 DIAGNOSIS — R197 Diarrhea, unspecified: Secondary | ICD-10-CM | POA: Diagnosis not present

## 2022-06-22 DIAGNOSIS — M6281 Muscle weakness (generalized): Secondary | ICD-10-CM | POA: Diagnosis not present

## 2022-06-22 DIAGNOSIS — N186 End stage renal disease: Secondary | ICD-10-CM | POA: Diagnosis not present

## 2022-06-22 DIAGNOSIS — R279 Unspecified lack of coordination: Secondary | ICD-10-CM | POA: Diagnosis not present

## 2022-06-22 DIAGNOSIS — I159 Secondary hypertension, unspecified: Secondary | ICD-10-CM | POA: Diagnosis not present

## 2022-06-22 DIAGNOSIS — N2581 Secondary hyperparathyroidism of renal origin: Secondary | ICD-10-CM | POA: Diagnosis not present

## 2022-06-22 DIAGNOSIS — Z992 Dependence on renal dialysis: Secondary | ICD-10-CM | POA: Diagnosis not present

## 2022-06-22 DIAGNOSIS — E1122 Type 2 diabetes mellitus with diabetic chronic kidney disease: Secondary | ICD-10-CM | POA: Diagnosis not present

## 2022-06-22 DIAGNOSIS — I629 Nontraumatic intracranial hemorrhage, unspecified: Secondary | ICD-10-CM | POA: Diagnosis not present

## 2022-06-24 DIAGNOSIS — M6281 Muscle weakness (generalized): Secondary | ICD-10-CM | POA: Diagnosis not present

## 2022-06-24 DIAGNOSIS — I629 Nontraumatic intracranial hemorrhage, unspecified: Secondary | ICD-10-CM | POA: Diagnosis not present

## 2022-06-24 DIAGNOSIS — R279 Unspecified lack of coordination: Secondary | ICD-10-CM | POA: Diagnosis not present

## 2022-06-25 DIAGNOSIS — I629 Nontraumatic intracranial hemorrhage, unspecified: Secondary | ICD-10-CM | POA: Diagnosis not present

## 2022-06-25 DIAGNOSIS — N2581 Secondary hyperparathyroidism of renal origin: Secondary | ICD-10-CM | POA: Diagnosis not present

## 2022-06-25 DIAGNOSIS — N186 End stage renal disease: Secondary | ICD-10-CM | POA: Diagnosis not present

## 2022-06-25 DIAGNOSIS — M6281 Muscle weakness (generalized): Secondary | ICD-10-CM | POA: Diagnosis not present

## 2022-06-25 DIAGNOSIS — Z992 Dependence on renal dialysis: Secondary | ICD-10-CM | POA: Diagnosis not present

## 2022-06-25 DIAGNOSIS — I159 Secondary hypertension, unspecified: Secondary | ICD-10-CM | POA: Diagnosis not present

## 2022-06-25 DIAGNOSIS — R197 Diarrhea, unspecified: Secondary | ICD-10-CM | POA: Diagnosis not present

## 2022-06-25 DIAGNOSIS — R279 Unspecified lack of coordination: Secondary | ICD-10-CM | POA: Diagnosis not present

## 2022-06-25 DIAGNOSIS — E1122 Type 2 diabetes mellitus with diabetic chronic kidney disease: Secondary | ICD-10-CM | POA: Diagnosis not present

## 2022-06-26 DIAGNOSIS — G8929 Other chronic pain: Secondary | ICD-10-CM | POA: Diagnosis not present

## 2022-06-26 DIAGNOSIS — I629 Nontraumatic intracranial hemorrhage, unspecified: Secondary | ICD-10-CM | POA: Diagnosis not present

## 2022-06-26 DIAGNOSIS — R279 Unspecified lack of coordination: Secondary | ICD-10-CM | POA: Diagnosis not present

## 2022-06-26 DIAGNOSIS — I10 Essential (primary) hypertension: Secondary | ICD-10-CM | POA: Diagnosis not present

## 2022-06-26 DIAGNOSIS — G629 Polyneuropathy, unspecified: Secondary | ICD-10-CM | POA: Diagnosis not present

## 2022-06-26 DIAGNOSIS — M6281 Muscle weakness (generalized): Secondary | ICD-10-CM | POA: Diagnosis not present

## 2022-06-27 DIAGNOSIS — I159 Secondary hypertension, unspecified: Secondary | ICD-10-CM | POA: Diagnosis not present

## 2022-06-27 DIAGNOSIS — I629 Nontraumatic intracranial hemorrhage, unspecified: Secondary | ICD-10-CM | POA: Diagnosis not present

## 2022-06-27 DIAGNOSIS — R279 Unspecified lack of coordination: Secondary | ICD-10-CM | POA: Diagnosis not present

## 2022-06-27 DIAGNOSIS — E1122 Type 2 diabetes mellitus with diabetic chronic kidney disease: Secondary | ICD-10-CM | POA: Diagnosis not present

## 2022-06-27 DIAGNOSIS — N2581 Secondary hyperparathyroidism of renal origin: Secondary | ICD-10-CM | POA: Diagnosis not present

## 2022-06-27 DIAGNOSIS — N186 End stage renal disease: Secondary | ICD-10-CM | POA: Diagnosis not present

## 2022-06-27 DIAGNOSIS — R197 Diarrhea, unspecified: Secondary | ICD-10-CM | POA: Diagnosis not present

## 2022-06-27 DIAGNOSIS — M6281 Muscle weakness (generalized): Secondary | ICD-10-CM | POA: Diagnosis not present

## 2022-06-27 DIAGNOSIS — Z992 Dependence on renal dialysis: Secondary | ICD-10-CM | POA: Diagnosis not present

## 2022-06-28 DIAGNOSIS — Z961 Presence of intraocular lens: Secondary | ICD-10-CM | POA: Diagnosis not present

## 2022-06-28 DIAGNOSIS — H2511 Age-related nuclear cataract, right eye: Secondary | ICD-10-CM | POA: Diagnosis not present

## 2022-06-28 DIAGNOSIS — M6281 Muscle weakness (generalized): Secondary | ICD-10-CM | POA: Diagnosis not present

## 2022-06-28 DIAGNOSIS — H524 Presbyopia: Secondary | ICD-10-CM | POA: Diagnosis not present

## 2022-06-28 DIAGNOSIS — R279 Unspecified lack of coordination: Secondary | ICD-10-CM | POA: Diagnosis not present

## 2022-06-28 DIAGNOSIS — I629 Nontraumatic intracranial hemorrhage, unspecified: Secondary | ICD-10-CM | POA: Diagnosis not present

## 2022-06-28 DIAGNOSIS — E1137X3 Type 2 diabetes mellitus with diabetic macular edema, resolved following treatment, bilateral: Secondary | ICD-10-CM | POA: Diagnosis not present

## 2022-06-29 DIAGNOSIS — I159 Secondary hypertension, unspecified: Secondary | ICD-10-CM | POA: Diagnosis not present

## 2022-06-29 DIAGNOSIS — R197 Diarrhea, unspecified: Secondary | ICD-10-CM | POA: Diagnosis not present

## 2022-06-29 DIAGNOSIS — E1122 Type 2 diabetes mellitus with diabetic chronic kidney disease: Secondary | ICD-10-CM | POA: Diagnosis not present

## 2022-06-29 DIAGNOSIS — N186 End stage renal disease: Secondary | ICD-10-CM | POA: Diagnosis not present

## 2022-06-29 DIAGNOSIS — N2581 Secondary hyperparathyroidism of renal origin: Secondary | ICD-10-CM | POA: Diagnosis not present

## 2022-06-29 DIAGNOSIS — Z992 Dependence on renal dialysis: Secondary | ICD-10-CM | POA: Diagnosis not present

## 2022-06-30 DIAGNOSIS — R279 Unspecified lack of coordination: Secondary | ICD-10-CM | POA: Diagnosis not present

## 2022-06-30 DIAGNOSIS — I629 Nontraumatic intracranial hemorrhage, unspecified: Secondary | ICD-10-CM | POA: Diagnosis not present

## 2022-06-30 DIAGNOSIS — M6281 Muscle weakness (generalized): Secondary | ICD-10-CM | POA: Diagnosis not present

## 2022-07-01 DIAGNOSIS — R279 Unspecified lack of coordination: Secondary | ICD-10-CM | POA: Diagnosis not present

## 2022-07-01 DIAGNOSIS — I629 Nontraumatic intracranial hemorrhage, unspecified: Secondary | ICD-10-CM | POA: Diagnosis not present

## 2022-07-01 DIAGNOSIS — M6281 Muscle weakness (generalized): Secondary | ICD-10-CM | POA: Diagnosis not present

## 2022-07-02 DIAGNOSIS — E1122 Type 2 diabetes mellitus with diabetic chronic kidney disease: Secondary | ICD-10-CM | POA: Diagnosis not present

## 2022-07-02 DIAGNOSIS — I159 Secondary hypertension, unspecified: Secondary | ICD-10-CM | POA: Diagnosis not present

## 2022-07-02 DIAGNOSIS — N186 End stage renal disease: Secondary | ICD-10-CM | POA: Diagnosis not present

## 2022-07-02 DIAGNOSIS — N2581 Secondary hyperparathyroidism of renal origin: Secondary | ICD-10-CM | POA: Diagnosis not present

## 2022-07-02 DIAGNOSIS — Z992 Dependence on renal dialysis: Secondary | ICD-10-CM | POA: Diagnosis not present

## 2022-07-02 DIAGNOSIS — R197 Diarrhea, unspecified: Secondary | ICD-10-CM | POA: Diagnosis not present

## 2022-07-03 DIAGNOSIS — H5231 Anisometropia: Secondary | ICD-10-CM | POA: Diagnosis not present

## 2022-07-03 DIAGNOSIS — R279 Unspecified lack of coordination: Secondary | ICD-10-CM | POA: Diagnosis not present

## 2022-07-03 DIAGNOSIS — M6281 Muscle weakness (generalized): Secondary | ICD-10-CM | POA: Diagnosis not present

## 2022-07-03 DIAGNOSIS — I629 Nontraumatic intracranial hemorrhage, unspecified: Secondary | ICD-10-CM | POA: Diagnosis not present

## 2022-07-04 DIAGNOSIS — I159 Secondary hypertension, unspecified: Secondary | ICD-10-CM | POA: Diagnosis not present

## 2022-07-04 DIAGNOSIS — Z992 Dependence on renal dialysis: Secondary | ICD-10-CM | POA: Diagnosis not present

## 2022-07-04 DIAGNOSIS — N186 End stage renal disease: Secondary | ICD-10-CM | POA: Diagnosis not present

## 2022-07-04 DIAGNOSIS — R197 Diarrhea, unspecified: Secondary | ICD-10-CM | POA: Diagnosis not present

## 2022-07-04 DIAGNOSIS — E1122 Type 2 diabetes mellitus with diabetic chronic kidney disease: Secondary | ICD-10-CM | POA: Diagnosis not present

## 2022-07-04 DIAGNOSIS — N2581 Secondary hyperparathyroidism of renal origin: Secondary | ICD-10-CM | POA: Diagnosis not present

## 2022-07-05 DIAGNOSIS — I629 Nontraumatic intracranial hemorrhage, unspecified: Secondary | ICD-10-CM | POA: Diagnosis not present

## 2022-07-05 DIAGNOSIS — R279 Unspecified lack of coordination: Secondary | ICD-10-CM | POA: Diagnosis not present

## 2022-07-05 DIAGNOSIS — M6281 Muscle weakness (generalized): Secondary | ICD-10-CM | POA: Diagnosis not present

## 2022-07-06 DIAGNOSIS — Z992 Dependence on renal dialysis: Secondary | ICD-10-CM | POA: Diagnosis not present

## 2022-07-06 DIAGNOSIS — I159 Secondary hypertension, unspecified: Secondary | ICD-10-CM | POA: Diagnosis not present

## 2022-07-06 DIAGNOSIS — R197 Diarrhea, unspecified: Secondary | ICD-10-CM | POA: Diagnosis not present

## 2022-07-06 DIAGNOSIS — N2581 Secondary hyperparathyroidism of renal origin: Secondary | ICD-10-CM | POA: Diagnosis not present

## 2022-07-06 DIAGNOSIS — E1122 Type 2 diabetes mellitus with diabetic chronic kidney disease: Secondary | ICD-10-CM | POA: Diagnosis not present

## 2022-07-06 DIAGNOSIS — N186 End stage renal disease: Secondary | ICD-10-CM | POA: Diagnosis not present

## 2022-07-08 DIAGNOSIS — Z992 Dependence on renal dialysis: Secondary | ICD-10-CM | POA: Diagnosis not present

## 2022-07-08 DIAGNOSIS — E1122 Type 2 diabetes mellitus with diabetic chronic kidney disease: Secondary | ICD-10-CM | POA: Diagnosis not present

## 2022-07-08 DIAGNOSIS — N186 End stage renal disease: Secondary | ICD-10-CM | POA: Diagnosis not present

## 2022-07-09 DIAGNOSIS — N2581 Secondary hyperparathyroidism of renal origin: Secondary | ICD-10-CM | POA: Diagnosis not present

## 2022-07-09 DIAGNOSIS — E1122 Type 2 diabetes mellitus with diabetic chronic kidney disease: Secondary | ICD-10-CM | POA: Diagnosis not present

## 2022-07-09 DIAGNOSIS — D631 Anemia in chronic kidney disease: Secondary | ICD-10-CM | POA: Diagnosis not present

## 2022-07-09 DIAGNOSIS — Z992 Dependence on renal dialysis: Secondary | ICD-10-CM | POA: Diagnosis not present

## 2022-07-09 DIAGNOSIS — R197 Diarrhea, unspecified: Secondary | ICD-10-CM | POA: Diagnosis not present

## 2022-07-09 DIAGNOSIS — I159 Secondary hypertension, unspecified: Secondary | ICD-10-CM | POA: Diagnosis not present

## 2022-07-09 DIAGNOSIS — N186 End stage renal disease: Secondary | ICD-10-CM | POA: Diagnosis not present

## 2022-07-10 DIAGNOSIS — Z7189 Other specified counseling: Secondary | ICD-10-CM | POA: Diagnosis not present

## 2022-07-10 DIAGNOSIS — I639 Cerebral infarction, unspecified: Secondary | ICD-10-CM | POA: Diagnosis not present

## 2022-07-10 DIAGNOSIS — N186 End stage renal disease: Secondary | ICD-10-CM | POA: Diagnosis not present

## 2022-07-10 DIAGNOSIS — M6281 Muscle weakness (generalized): Secondary | ICD-10-CM | POA: Diagnosis not present

## 2022-07-10 DIAGNOSIS — R279 Unspecified lack of coordination: Secondary | ICD-10-CM | POA: Diagnosis not present

## 2022-07-10 DIAGNOSIS — I10 Essential (primary) hypertension: Secondary | ICD-10-CM | POA: Diagnosis not present

## 2022-07-10 DIAGNOSIS — G629 Polyneuropathy, unspecified: Secondary | ICD-10-CM | POA: Diagnosis not present

## 2022-07-10 DIAGNOSIS — I629 Nontraumatic intracranial hemorrhage, unspecified: Secondary | ICD-10-CM | POA: Diagnosis not present

## 2022-07-11 DIAGNOSIS — D631 Anemia in chronic kidney disease: Secondary | ICD-10-CM | POA: Diagnosis not present

## 2022-07-11 DIAGNOSIS — Z992 Dependence on renal dialysis: Secondary | ICD-10-CM | POA: Diagnosis not present

## 2022-07-11 DIAGNOSIS — R197 Diarrhea, unspecified: Secondary | ICD-10-CM | POA: Diagnosis not present

## 2022-07-11 DIAGNOSIS — I159 Secondary hypertension, unspecified: Secondary | ICD-10-CM | POA: Diagnosis not present

## 2022-07-11 DIAGNOSIS — N186 End stage renal disease: Secondary | ICD-10-CM | POA: Diagnosis not present

## 2022-07-11 DIAGNOSIS — E1122 Type 2 diabetes mellitus with diabetic chronic kidney disease: Secondary | ICD-10-CM | POA: Diagnosis not present

## 2022-07-11 DIAGNOSIS — N2581 Secondary hyperparathyroidism of renal origin: Secondary | ICD-10-CM | POA: Diagnosis not present

## 2022-07-12 DIAGNOSIS — R279 Unspecified lack of coordination: Secondary | ICD-10-CM | POA: Diagnosis not present

## 2022-07-12 DIAGNOSIS — M6281 Muscle weakness (generalized): Secondary | ICD-10-CM | POA: Diagnosis not present

## 2022-07-12 DIAGNOSIS — I629 Nontraumatic intracranial hemorrhage, unspecified: Secondary | ICD-10-CM | POA: Diagnosis not present

## 2022-07-12 DIAGNOSIS — I10 Essential (primary) hypertension: Secondary | ICD-10-CM | POA: Diagnosis not present

## 2022-07-12 DIAGNOSIS — E119 Type 2 diabetes mellitus without complications: Secondary | ICD-10-CM | POA: Diagnosis not present

## 2022-07-13 DIAGNOSIS — R197 Diarrhea, unspecified: Secondary | ICD-10-CM | POA: Diagnosis not present

## 2022-07-13 DIAGNOSIS — Z992 Dependence on renal dialysis: Secondary | ICD-10-CM | POA: Diagnosis not present

## 2022-07-13 DIAGNOSIS — E1122 Type 2 diabetes mellitus with diabetic chronic kidney disease: Secondary | ICD-10-CM | POA: Diagnosis not present

## 2022-07-13 DIAGNOSIS — N2581 Secondary hyperparathyroidism of renal origin: Secondary | ICD-10-CM | POA: Diagnosis not present

## 2022-07-13 DIAGNOSIS — N186 End stage renal disease: Secondary | ICD-10-CM | POA: Diagnosis not present

## 2022-07-13 DIAGNOSIS — I159 Secondary hypertension, unspecified: Secondary | ICD-10-CM | POA: Diagnosis not present

## 2022-07-13 DIAGNOSIS — D631 Anemia in chronic kidney disease: Secondary | ICD-10-CM | POA: Diagnosis not present

## 2022-07-16 DIAGNOSIS — Z992 Dependence on renal dialysis: Secondary | ICD-10-CM | POA: Diagnosis not present

## 2022-07-16 DIAGNOSIS — R197 Diarrhea, unspecified: Secondary | ICD-10-CM | POA: Diagnosis not present

## 2022-07-16 DIAGNOSIS — I159 Secondary hypertension, unspecified: Secondary | ICD-10-CM | POA: Diagnosis not present

## 2022-07-16 DIAGNOSIS — D631 Anemia in chronic kidney disease: Secondary | ICD-10-CM | POA: Diagnosis not present

## 2022-07-16 DIAGNOSIS — E1122 Type 2 diabetes mellitus with diabetic chronic kidney disease: Secondary | ICD-10-CM | POA: Diagnosis not present

## 2022-07-16 DIAGNOSIS — N186 End stage renal disease: Secondary | ICD-10-CM | POA: Diagnosis not present

## 2022-07-16 DIAGNOSIS — N2581 Secondary hyperparathyroidism of renal origin: Secondary | ICD-10-CM | POA: Diagnosis not present

## 2022-07-16 DIAGNOSIS — I10 Essential (primary) hypertension: Secondary | ICD-10-CM | POA: Diagnosis not present

## 2022-07-17 DIAGNOSIS — M6281 Muscle weakness (generalized): Secondary | ICD-10-CM | POA: Diagnosis not present

## 2022-07-17 DIAGNOSIS — I629 Nontraumatic intracranial hemorrhage, unspecified: Secondary | ICD-10-CM | POA: Diagnosis not present

## 2022-07-17 DIAGNOSIS — E1122 Type 2 diabetes mellitus with diabetic chronic kidney disease: Secondary | ICD-10-CM | POA: Diagnosis not present

## 2022-07-17 DIAGNOSIS — R279 Unspecified lack of coordination: Secondary | ICD-10-CM | POA: Diagnosis not present

## 2022-07-17 DIAGNOSIS — D649 Anemia, unspecified: Secondary | ICD-10-CM | POA: Diagnosis not present

## 2022-07-18 DIAGNOSIS — E1122 Type 2 diabetes mellitus with diabetic chronic kidney disease: Secondary | ICD-10-CM | POA: Diagnosis not present

## 2022-07-18 DIAGNOSIS — R197 Diarrhea, unspecified: Secondary | ICD-10-CM | POA: Diagnosis not present

## 2022-07-18 DIAGNOSIS — N2581 Secondary hyperparathyroidism of renal origin: Secondary | ICD-10-CM | POA: Diagnosis not present

## 2022-07-18 DIAGNOSIS — I159 Secondary hypertension, unspecified: Secondary | ICD-10-CM | POA: Diagnosis not present

## 2022-07-18 DIAGNOSIS — Z992 Dependence on renal dialysis: Secondary | ICD-10-CM | POA: Diagnosis not present

## 2022-07-18 DIAGNOSIS — N186 End stage renal disease: Secondary | ICD-10-CM | POA: Diagnosis not present

## 2022-07-18 DIAGNOSIS — D631 Anemia in chronic kidney disease: Secondary | ICD-10-CM | POA: Diagnosis not present

## 2022-07-19 DIAGNOSIS — I629 Nontraumatic intracranial hemorrhage, unspecified: Secondary | ICD-10-CM | POA: Diagnosis not present

## 2022-07-19 DIAGNOSIS — M6281 Muscle weakness (generalized): Secondary | ICD-10-CM | POA: Diagnosis not present

## 2022-07-19 DIAGNOSIS — R279 Unspecified lack of coordination: Secondary | ICD-10-CM | POA: Diagnosis not present

## 2022-07-20 DIAGNOSIS — Z992 Dependence on renal dialysis: Secondary | ICD-10-CM | POA: Diagnosis not present

## 2022-07-20 DIAGNOSIS — N2581 Secondary hyperparathyroidism of renal origin: Secondary | ICD-10-CM | POA: Diagnosis not present

## 2022-07-20 DIAGNOSIS — I159 Secondary hypertension, unspecified: Secondary | ICD-10-CM | POA: Diagnosis not present

## 2022-07-20 DIAGNOSIS — R279 Unspecified lack of coordination: Secondary | ICD-10-CM | POA: Diagnosis not present

## 2022-07-20 DIAGNOSIS — D631 Anemia in chronic kidney disease: Secondary | ICD-10-CM | POA: Diagnosis not present

## 2022-07-20 DIAGNOSIS — N186 End stage renal disease: Secondary | ICD-10-CM | POA: Diagnosis not present

## 2022-07-20 DIAGNOSIS — R197 Diarrhea, unspecified: Secondary | ICD-10-CM | POA: Diagnosis not present

## 2022-07-20 DIAGNOSIS — E1122 Type 2 diabetes mellitus with diabetic chronic kidney disease: Secondary | ICD-10-CM | POA: Diagnosis not present

## 2022-07-20 DIAGNOSIS — I629 Nontraumatic intracranial hemorrhage, unspecified: Secondary | ICD-10-CM | POA: Diagnosis not present

## 2022-07-20 DIAGNOSIS — M6281 Muscle weakness (generalized): Secondary | ICD-10-CM | POA: Diagnosis not present

## 2022-07-23 DIAGNOSIS — Z992 Dependence on renal dialysis: Secondary | ICD-10-CM | POA: Diagnosis not present

## 2022-07-23 DIAGNOSIS — N186 End stage renal disease: Secondary | ICD-10-CM | POA: Diagnosis not present

## 2022-07-23 DIAGNOSIS — R279 Unspecified lack of coordination: Secondary | ICD-10-CM | POA: Diagnosis not present

## 2022-07-23 DIAGNOSIS — D631 Anemia in chronic kidney disease: Secondary | ICD-10-CM | POA: Diagnosis not present

## 2022-07-23 DIAGNOSIS — E1122 Type 2 diabetes mellitus with diabetic chronic kidney disease: Secondary | ICD-10-CM | POA: Diagnosis not present

## 2022-07-23 DIAGNOSIS — N2581 Secondary hyperparathyroidism of renal origin: Secondary | ICD-10-CM | POA: Diagnosis not present

## 2022-07-23 DIAGNOSIS — M6281 Muscle weakness (generalized): Secondary | ICD-10-CM | POA: Diagnosis not present

## 2022-07-23 DIAGNOSIS — I159 Secondary hypertension, unspecified: Secondary | ICD-10-CM | POA: Diagnosis not present

## 2022-07-23 DIAGNOSIS — I629 Nontraumatic intracranial hemorrhage, unspecified: Secondary | ICD-10-CM | POA: Diagnosis not present

## 2022-07-23 DIAGNOSIS — R197 Diarrhea, unspecified: Secondary | ICD-10-CM | POA: Diagnosis not present

## 2022-07-24 DIAGNOSIS — R279 Unspecified lack of coordination: Secondary | ICD-10-CM | POA: Diagnosis not present

## 2022-07-24 DIAGNOSIS — I629 Nontraumatic intracranial hemorrhage, unspecified: Secondary | ICD-10-CM | POA: Diagnosis not present

## 2022-07-24 DIAGNOSIS — M6281 Muscle weakness (generalized): Secondary | ICD-10-CM | POA: Diagnosis not present

## 2022-07-25 DIAGNOSIS — D631 Anemia in chronic kidney disease: Secondary | ICD-10-CM | POA: Diagnosis not present

## 2022-07-25 DIAGNOSIS — Z992 Dependence on renal dialysis: Secondary | ICD-10-CM | POA: Diagnosis not present

## 2022-07-25 DIAGNOSIS — N186 End stage renal disease: Secondary | ICD-10-CM | POA: Diagnosis not present

## 2022-07-25 DIAGNOSIS — N2581 Secondary hyperparathyroidism of renal origin: Secondary | ICD-10-CM | POA: Diagnosis not present

## 2022-07-25 DIAGNOSIS — E1122 Type 2 diabetes mellitus with diabetic chronic kidney disease: Secondary | ICD-10-CM | POA: Diagnosis not present

## 2022-07-25 DIAGNOSIS — M6281 Muscle weakness (generalized): Secondary | ICD-10-CM | POA: Diagnosis not present

## 2022-07-25 DIAGNOSIS — I159 Secondary hypertension, unspecified: Secondary | ICD-10-CM | POA: Diagnosis not present

## 2022-07-25 DIAGNOSIS — R279 Unspecified lack of coordination: Secondary | ICD-10-CM | POA: Diagnosis not present

## 2022-07-25 DIAGNOSIS — I629 Nontraumatic intracranial hemorrhage, unspecified: Secondary | ICD-10-CM | POA: Diagnosis not present

## 2022-07-25 DIAGNOSIS — R197 Diarrhea, unspecified: Secondary | ICD-10-CM | POA: Diagnosis not present

## 2022-07-26 DIAGNOSIS — I10 Essential (primary) hypertension: Secondary | ICD-10-CM | POA: Diagnosis not present

## 2022-07-26 DIAGNOSIS — M6281 Muscle weakness (generalized): Secondary | ICD-10-CM | POA: Diagnosis not present

## 2022-07-26 DIAGNOSIS — N186 End stage renal disease: Secondary | ICD-10-CM | POA: Diagnosis not present

## 2022-07-26 DIAGNOSIS — R279 Unspecified lack of coordination: Secondary | ICD-10-CM | POA: Diagnosis not present

## 2022-07-26 DIAGNOSIS — E1122 Type 2 diabetes mellitus with diabetic chronic kidney disease: Secondary | ICD-10-CM | POA: Diagnosis not present

## 2022-07-26 DIAGNOSIS — G629 Polyneuropathy, unspecified: Secondary | ICD-10-CM | POA: Diagnosis not present

## 2022-07-26 DIAGNOSIS — Z7409 Other reduced mobility: Secondary | ICD-10-CM | POA: Diagnosis not present

## 2022-07-26 DIAGNOSIS — I629 Nontraumatic intracranial hemorrhage, unspecified: Secondary | ICD-10-CM | POA: Diagnosis not present

## 2022-07-27 DIAGNOSIS — R279 Unspecified lack of coordination: Secondary | ICD-10-CM | POA: Diagnosis not present

## 2022-07-27 DIAGNOSIS — D631 Anemia in chronic kidney disease: Secondary | ICD-10-CM | POA: Diagnosis not present

## 2022-07-27 DIAGNOSIS — M6281 Muscle weakness (generalized): Secondary | ICD-10-CM | POA: Diagnosis not present

## 2022-07-27 DIAGNOSIS — N2581 Secondary hyperparathyroidism of renal origin: Secondary | ICD-10-CM | POA: Diagnosis not present

## 2022-07-27 DIAGNOSIS — I159 Secondary hypertension, unspecified: Secondary | ICD-10-CM | POA: Diagnosis not present

## 2022-07-27 DIAGNOSIS — Z992 Dependence on renal dialysis: Secondary | ICD-10-CM | POA: Diagnosis not present

## 2022-07-27 DIAGNOSIS — E1122 Type 2 diabetes mellitus with diabetic chronic kidney disease: Secondary | ICD-10-CM | POA: Diagnosis not present

## 2022-07-27 DIAGNOSIS — N186 End stage renal disease: Secondary | ICD-10-CM | POA: Diagnosis not present

## 2022-07-27 DIAGNOSIS — R197 Diarrhea, unspecified: Secondary | ICD-10-CM | POA: Diagnosis not present

## 2022-07-27 DIAGNOSIS — I629 Nontraumatic intracranial hemorrhage, unspecified: Secondary | ICD-10-CM | POA: Diagnosis not present

## 2022-07-28 DIAGNOSIS — I629 Nontraumatic intracranial hemorrhage, unspecified: Secondary | ICD-10-CM | POA: Diagnosis not present

## 2022-07-28 DIAGNOSIS — M6281 Muscle weakness (generalized): Secondary | ICD-10-CM | POA: Diagnosis not present

## 2022-07-28 DIAGNOSIS — R279 Unspecified lack of coordination: Secondary | ICD-10-CM | POA: Diagnosis not present

## 2022-07-30 DIAGNOSIS — E1122 Type 2 diabetes mellitus with diabetic chronic kidney disease: Secondary | ICD-10-CM | POA: Diagnosis not present

## 2022-07-30 DIAGNOSIS — I159 Secondary hypertension, unspecified: Secondary | ICD-10-CM | POA: Diagnosis not present

## 2022-07-30 DIAGNOSIS — N2581 Secondary hyperparathyroidism of renal origin: Secondary | ICD-10-CM | POA: Diagnosis not present

## 2022-07-30 DIAGNOSIS — Z992 Dependence on renal dialysis: Secondary | ICD-10-CM | POA: Diagnosis not present

## 2022-07-30 DIAGNOSIS — N186 End stage renal disease: Secondary | ICD-10-CM | POA: Diagnosis not present

## 2022-07-30 DIAGNOSIS — D631 Anemia in chronic kidney disease: Secondary | ICD-10-CM | POA: Diagnosis not present

## 2022-07-30 DIAGNOSIS — R197 Diarrhea, unspecified: Secondary | ICD-10-CM | POA: Diagnosis not present

## 2022-07-31 DIAGNOSIS — I629 Nontraumatic intracranial hemorrhage, unspecified: Secondary | ICD-10-CM | POA: Diagnosis not present

## 2022-07-31 DIAGNOSIS — R279 Unspecified lack of coordination: Secondary | ICD-10-CM | POA: Diagnosis not present

## 2022-07-31 DIAGNOSIS — M6281 Muscle weakness (generalized): Secondary | ICD-10-CM | POA: Diagnosis not present

## 2022-08-01 DIAGNOSIS — N186 End stage renal disease: Secondary | ICD-10-CM | POA: Diagnosis not present

## 2022-08-01 DIAGNOSIS — E1122 Type 2 diabetes mellitus with diabetic chronic kidney disease: Secondary | ICD-10-CM | POA: Diagnosis not present

## 2022-08-01 DIAGNOSIS — I159 Secondary hypertension, unspecified: Secondary | ICD-10-CM | POA: Diagnosis not present

## 2022-08-01 DIAGNOSIS — N2581 Secondary hyperparathyroidism of renal origin: Secondary | ICD-10-CM | POA: Diagnosis not present

## 2022-08-01 DIAGNOSIS — R197 Diarrhea, unspecified: Secondary | ICD-10-CM | POA: Diagnosis not present

## 2022-08-01 DIAGNOSIS — D631 Anemia in chronic kidney disease: Secondary | ICD-10-CM | POA: Diagnosis not present

## 2022-08-01 DIAGNOSIS — Z992 Dependence on renal dialysis: Secondary | ICD-10-CM | POA: Diagnosis not present

## 2022-08-03 DIAGNOSIS — D631 Anemia in chronic kidney disease: Secondary | ICD-10-CM | POA: Diagnosis not present

## 2022-08-03 DIAGNOSIS — I159 Secondary hypertension, unspecified: Secondary | ICD-10-CM | POA: Diagnosis not present

## 2022-08-03 DIAGNOSIS — Z992 Dependence on renal dialysis: Secondary | ICD-10-CM | POA: Diagnosis not present

## 2022-08-03 DIAGNOSIS — G629 Polyneuropathy, unspecified: Secondary | ICD-10-CM | POA: Diagnosis not present

## 2022-08-03 DIAGNOSIS — N2581 Secondary hyperparathyroidism of renal origin: Secondary | ICD-10-CM | POA: Diagnosis not present

## 2022-08-03 DIAGNOSIS — N186 End stage renal disease: Secondary | ICD-10-CM | POA: Diagnosis not present

## 2022-08-03 DIAGNOSIS — E1122 Type 2 diabetes mellitus with diabetic chronic kidney disease: Secondary | ICD-10-CM | POA: Diagnosis not present

## 2022-08-03 DIAGNOSIS — R197 Diarrhea, unspecified: Secondary | ICD-10-CM | POA: Diagnosis not present

## 2022-08-06 DIAGNOSIS — R197 Diarrhea, unspecified: Secondary | ICD-10-CM | POA: Diagnosis not present

## 2022-08-06 DIAGNOSIS — E1122 Type 2 diabetes mellitus with diabetic chronic kidney disease: Secondary | ICD-10-CM | POA: Diagnosis not present

## 2022-08-06 DIAGNOSIS — N2581 Secondary hyperparathyroidism of renal origin: Secondary | ICD-10-CM | POA: Diagnosis not present

## 2022-08-06 DIAGNOSIS — N186 End stage renal disease: Secondary | ICD-10-CM | POA: Diagnosis not present

## 2022-08-06 DIAGNOSIS — Z992 Dependence on renal dialysis: Secondary | ICD-10-CM | POA: Diagnosis not present

## 2022-08-06 DIAGNOSIS — D631 Anemia in chronic kidney disease: Secondary | ICD-10-CM | POA: Diagnosis not present

## 2022-08-06 DIAGNOSIS — I159 Secondary hypertension, unspecified: Secondary | ICD-10-CM | POA: Diagnosis not present

## 2022-08-08 DIAGNOSIS — E1122 Type 2 diabetes mellitus with diabetic chronic kidney disease: Secondary | ICD-10-CM | POA: Diagnosis not present

## 2022-08-08 DIAGNOSIS — N2581 Secondary hyperparathyroidism of renal origin: Secondary | ICD-10-CM | POA: Diagnosis not present

## 2022-08-08 DIAGNOSIS — I159 Secondary hypertension, unspecified: Secondary | ICD-10-CM | POA: Diagnosis not present

## 2022-08-08 DIAGNOSIS — D631 Anemia in chronic kidney disease: Secondary | ICD-10-CM | POA: Diagnosis not present

## 2022-08-08 DIAGNOSIS — N186 End stage renal disease: Secondary | ICD-10-CM | POA: Diagnosis not present

## 2022-08-08 DIAGNOSIS — R197 Diarrhea, unspecified: Secondary | ICD-10-CM | POA: Diagnosis not present

## 2022-08-08 DIAGNOSIS — Z992 Dependence on renal dialysis: Secondary | ICD-10-CM | POA: Diagnosis not present

## 2022-08-10 DIAGNOSIS — N186 End stage renal disease: Secondary | ICD-10-CM | POA: Diagnosis not present

## 2022-08-10 DIAGNOSIS — Z992 Dependence on renal dialysis: Secondary | ICD-10-CM | POA: Diagnosis not present

## 2022-08-10 DIAGNOSIS — D631 Anemia in chronic kidney disease: Secondary | ICD-10-CM | POA: Diagnosis not present

## 2022-08-10 DIAGNOSIS — R197 Diarrhea, unspecified: Secondary | ICD-10-CM | POA: Diagnosis not present

## 2022-08-10 DIAGNOSIS — E1122 Type 2 diabetes mellitus with diabetic chronic kidney disease: Secondary | ICD-10-CM | POA: Diagnosis not present

## 2022-08-10 DIAGNOSIS — N2581 Secondary hyperparathyroidism of renal origin: Secondary | ICD-10-CM | POA: Diagnosis not present

## 2022-08-13 DIAGNOSIS — N2581 Secondary hyperparathyroidism of renal origin: Secondary | ICD-10-CM | POA: Diagnosis not present

## 2022-08-13 DIAGNOSIS — D631 Anemia in chronic kidney disease: Secondary | ICD-10-CM | POA: Diagnosis not present

## 2022-08-13 DIAGNOSIS — Z992 Dependence on renal dialysis: Secondary | ICD-10-CM | POA: Diagnosis not present

## 2022-08-13 DIAGNOSIS — R197 Diarrhea, unspecified: Secondary | ICD-10-CM | POA: Diagnosis not present

## 2022-08-13 DIAGNOSIS — N186 End stage renal disease: Secondary | ICD-10-CM | POA: Diagnosis not present

## 2022-08-13 DIAGNOSIS — E1122 Type 2 diabetes mellitus with diabetic chronic kidney disease: Secondary | ICD-10-CM | POA: Diagnosis not present

## 2022-08-14 DIAGNOSIS — N186 End stage renal disease: Secondary | ICD-10-CM | POA: Diagnosis not present

## 2022-08-14 DIAGNOSIS — Z79899 Other long term (current) drug therapy: Secondary | ICD-10-CM | POA: Diagnosis not present

## 2022-08-15 DIAGNOSIS — Z992 Dependence on renal dialysis: Secondary | ICD-10-CM | POA: Diagnosis not present

## 2022-08-15 DIAGNOSIS — D631 Anemia in chronic kidney disease: Secondary | ICD-10-CM | POA: Diagnosis not present

## 2022-08-15 DIAGNOSIS — E1122 Type 2 diabetes mellitus with diabetic chronic kidney disease: Secondary | ICD-10-CM | POA: Diagnosis not present

## 2022-08-15 DIAGNOSIS — N186 End stage renal disease: Secondary | ICD-10-CM | POA: Diagnosis not present

## 2022-08-15 DIAGNOSIS — R197 Diarrhea, unspecified: Secondary | ICD-10-CM | POA: Diagnosis not present

## 2022-08-15 DIAGNOSIS — N2581 Secondary hyperparathyroidism of renal origin: Secondary | ICD-10-CM | POA: Diagnosis not present

## 2022-08-17 DIAGNOSIS — N186 End stage renal disease: Secondary | ICD-10-CM | POA: Diagnosis not present

## 2022-08-17 DIAGNOSIS — D631 Anemia in chronic kidney disease: Secondary | ICD-10-CM | POA: Diagnosis not present

## 2022-08-17 DIAGNOSIS — Z992 Dependence on renal dialysis: Secondary | ICD-10-CM | POA: Diagnosis not present

## 2022-08-17 DIAGNOSIS — R197 Diarrhea, unspecified: Secondary | ICD-10-CM | POA: Diagnosis not present

## 2022-08-17 DIAGNOSIS — N2581 Secondary hyperparathyroidism of renal origin: Secondary | ICD-10-CM | POA: Diagnosis not present

## 2022-08-17 DIAGNOSIS — I10 Essential (primary) hypertension: Secondary | ICD-10-CM | POA: Diagnosis not present

## 2022-08-17 DIAGNOSIS — E1122 Type 2 diabetes mellitus with diabetic chronic kidney disease: Secondary | ICD-10-CM | POA: Diagnosis not present

## 2022-08-20 DIAGNOSIS — N2581 Secondary hyperparathyroidism of renal origin: Secondary | ICD-10-CM | POA: Diagnosis not present

## 2022-08-20 DIAGNOSIS — N186 End stage renal disease: Secondary | ICD-10-CM | POA: Diagnosis not present

## 2022-08-20 DIAGNOSIS — D631 Anemia in chronic kidney disease: Secondary | ICD-10-CM | POA: Diagnosis not present

## 2022-08-20 DIAGNOSIS — E1122 Type 2 diabetes mellitus with diabetic chronic kidney disease: Secondary | ICD-10-CM | POA: Diagnosis not present

## 2022-08-20 DIAGNOSIS — Z992 Dependence on renal dialysis: Secondary | ICD-10-CM | POA: Diagnosis not present

## 2022-08-20 DIAGNOSIS — R197 Diarrhea, unspecified: Secondary | ICD-10-CM | POA: Diagnosis not present

## 2022-08-22 DIAGNOSIS — Z992 Dependence on renal dialysis: Secondary | ICD-10-CM | POA: Diagnosis not present

## 2022-08-22 DIAGNOSIS — N186 End stage renal disease: Secondary | ICD-10-CM | POA: Diagnosis not present

## 2022-08-22 DIAGNOSIS — R197 Diarrhea, unspecified: Secondary | ICD-10-CM | POA: Diagnosis not present

## 2022-08-22 DIAGNOSIS — N2581 Secondary hyperparathyroidism of renal origin: Secondary | ICD-10-CM | POA: Diagnosis not present

## 2022-08-22 DIAGNOSIS — E1122 Type 2 diabetes mellitus with diabetic chronic kidney disease: Secondary | ICD-10-CM | POA: Diagnosis not present

## 2022-08-22 DIAGNOSIS — D631 Anemia in chronic kidney disease: Secondary | ICD-10-CM | POA: Diagnosis not present

## 2022-08-24 DIAGNOSIS — N186 End stage renal disease: Secondary | ICD-10-CM | POA: Diagnosis not present

## 2022-08-24 DIAGNOSIS — R197 Diarrhea, unspecified: Secondary | ICD-10-CM | POA: Diagnosis not present

## 2022-08-24 DIAGNOSIS — E1122 Type 2 diabetes mellitus with diabetic chronic kidney disease: Secondary | ICD-10-CM | POA: Diagnosis not present

## 2022-08-24 DIAGNOSIS — N2581 Secondary hyperparathyroidism of renal origin: Secondary | ICD-10-CM | POA: Diagnosis not present

## 2022-08-24 DIAGNOSIS — Z992 Dependence on renal dialysis: Secondary | ICD-10-CM | POA: Diagnosis not present

## 2022-08-24 DIAGNOSIS — D631 Anemia in chronic kidney disease: Secondary | ICD-10-CM | POA: Diagnosis not present

## 2022-08-27 DIAGNOSIS — D631 Anemia in chronic kidney disease: Secondary | ICD-10-CM | POA: Diagnosis not present

## 2022-08-27 DIAGNOSIS — E1122 Type 2 diabetes mellitus with diabetic chronic kidney disease: Secondary | ICD-10-CM | POA: Diagnosis not present

## 2022-08-27 DIAGNOSIS — N2581 Secondary hyperparathyroidism of renal origin: Secondary | ICD-10-CM | POA: Diagnosis not present

## 2022-08-27 DIAGNOSIS — N186 End stage renal disease: Secondary | ICD-10-CM | POA: Diagnosis not present

## 2022-08-27 DIAGNOSIS — R197 Diarrhea, unspecified: Secondary | ICD-10-CM | POA: Diagnosis not present

## 2022-08-27 DIAGNOSIS — Z992 Dependence on renal dialysis: Secondary | ICD-10-CM | POA: Diagnosis not present

## 2022-08-29 DIAGNOSIS — R197 Diarrhea, unspecified: Secondary | ICD-10-CM | POA: Diagnosis not present

## 2022-08-29 DIAGNOSIS — N2581 Secondary hyperparathyroidism of renal origin: Secondary | ICD-10-CM | POA: Diagnosis not present

## 2022-08-29 DIAGNOSIS — D631 Anemia in chronic kidney disease: Secondary | ICD-10-CM | POA: Diagnosis not present

## 2022-08-29 DIAGNOSIS — Z992 Dependence on renal dialysis: Secondary | ICD-10-CM | POA: Diagnosis not present

## 2022-08-29 DIAGNOSIS — E1122 Type 2 diabetes mellitus with diabetic chronic kidney disease: Secondary | ICD-10-CM | POA: Diagnosis not present

## 2022-08-29 DIAGNOSIS — N186 End stage renal disease: Secondary | ICD-10-CM | POA: Diagnosis not present

## 2022-08-31 DIAGNOSIS — N186 End stage renal disease: Secondary | ICD-10-CM | POA: Diagnosis not present

## 2022-08-31 DIAGNOSIS — E1122 Type 2 diabetes mellitus with diabetic chronic kidney disease: Secondary | ICD-10-CM | POA: Diagnosis not present

## 2022-08-31 DIAGNOSIS — Z992 Dependence on renal dialysis: Secondary | ICD-10-CM | POA: Diagnosis not present

## 2022-08-31 DIAGNOSIS — R197 Diarrhea, unspecified: Secondary | ICD-10-CM | POA: Diagnosis not present

## 2022-08-31 DIAGNOSIS — D631 Anemia in chronic kidney disease: Secondary | ICD-10-CM | POA: Diagnosis not present

## 2022-08-31 DIAGNOSIS — N2581 Secondary hyperparathyroidism of renal origin: Secondary | ICD-10-CM | POA: Diagnosis not present

## 2022-09-03 DIAGNOSIS — N2581 Secondary hyperparathyroidism of renal origin: Secondary | ICD-10-CM | POA: Diagnosis not present

## 2022-09-03 DIAGNOSIS — E1122 Type 2 diabetes mellitus with diabetic chronic kidney disease: Secondary | ICD-10-CM | POA: Diagnosis not present

## 2022-09-03 DIAGNOSIS — R197 Diarrhea, unspecified: Secondary | ICD-10-CM | POA: Diagnosis not present

## 2022-09-03 DIAGNOSIS — N186 End stage renal disease: Secondary | ICD-10-CM | POA: Diagnosis not present

## 2022-09-03 DIAGNOSIS — Z992 Dependence on renal dialysis: Secondary | ICD-10-CM | POA: Diagnosis not present

## 2022-09-03 DIAGNOSIS — D631 Anemia in chronic kidney disease: Secondary | ICD-10-CM | POA: Diagnosis not present

## 2022-09-05 DIAGNOSIS — R197 Diarrhea, unspecified: Secondary | ICD-10-CM | POA: Diagnosis not present

## 2022-09-05 DIAGNOSIS — E1122 Type 2 diabetes mellitus with diabetic chronic kidney disease: Secondary | ICD-10-CM | POA: Diagnosis not present

## 2022-09-05 DIAGNOSIS — N2581 Secondary hyperparathyroidism of renal origin: Secondary | ICD-10-CM | POA: Diagnosis not present

## 2022-09-05 DIAGNOSIS — Z992 Dependence on renal dialysis: Secondary | ICD-10-CM | POA: Diagnosis not present

## 2022-09-05 DIAGNOSIS — D631 Anemia in chronic kidney disease: Secondary | ICD-10-CM | POA: Diagnosis not present

## 2022-09-05 DIAGNOSIS — N186 End stage renal disease: Secondary | ICD-10-CM | POA: Diagnosis not present

## 2022-09-07 DIAGNOSIS — Z992 Dependence on renal dialysis: Secondary | ICD-10-CM | POA: Diagnosis not present

## 2022-09-07 DIAGNOSIS — N186 End stage renal disease: Secondary | ICD-10-CM | POA: Diagnosis not present

## 2022-09-07 DIAGNOSIS — D631 Anemia in chronic kidney disease: Secondary | ICD-10-CM | POA: Diagnosis not present

## 2022-09-07 DIAGNOSIS — R197 Diarrhea, unspecified: Secondary | ICD-10-CM | POA: Diagnosis not present

## 2022-09-07 DIAGNOSIS — E1122 Type 2 diabetes mellitus with diabetic chronic kidney disease: Secondary | ICD-10-CM | POA: Diagnosis not present

## 2022-09-07 DIAGNOSIS — N2581 Secondary hyperparathyroidism of renal origin: Secondary | ICD-10-CM | POA: Diagnosis not present

## 2022-09-08 DIAGNOSIS — E1122 Type 2 diabetes mellitus with diabetic chronic kidney disease: Secondary | ICD-10-CM | POA: Diagnosis not present

## 2022-09-08 DIAGNOSIS — Z992 Dependence on renal dialysis: Secondary | ICD-10-CM | POA: Diagnosis not present

## 2022-09-08 DIAGNOSIS — N186 End stage renal disease: Secondary | ICD-10-CM | POA: Diagnosis not present

## 2022-09-10 DIAGNOSIS — N186 End stage renal disease: Secondary | ICD-10-CM | POA: Diagnosis not present

## 2022-09-10 DIAGNOSIS — R197 Diarrhea, unspecified: Secondary | ICD-10-CM | POA: Diagnosis not present

## 2022-09-10 DIAGNOSIS — N2581 Secondary hyperparathyroidism of renal origin: Secondary | ICD-10-CM | POA: Diagnosis not present

## 2022-09-10 DIAGNOSIS — Z992 Dependence on renal dialysis: Secondary | ICD-10-CM | POA: Diagnosis not present

## 2022-09-12 DIAGNOSIS — Z23 Encounter for immunization: Secondary | ICD-10-CM | POA: Diagnosis not present

## 2022-09-12 DIAGNOSIS — R197 Diarrhea, unspecified: Secondary | ICD-10-CM | POA: Diagnosis not present

## 2022-09-12 DIAGNOSIS — D631 Anemia in chronic kidney disease: Secondary | ICD-10-CM | POA: Diagnosis not present

## 2022-09-12 DIAGNOSIS — Z992 Dependence on renal dialysis: Secondary | ICD-10-CM | POA: Diagnosis not present

## 2022-09-12 DIAGNOSIS — E1122 Type 2 diabetes mellitus with diabetic chronic kidney disease: Secondary | ICD-10-CM | POA: Diagnosis not present

## 2022-09-12 DIAGNOSIS — N2581 Secondary hyperparathyroidism of renal origin: Secondary | ICD-10-CM | POA: Diagnosis not present

## 2022-09-12 DIAGNOSIS — N186 End stage renal disease: Secondary | ICD-10-CM | POA: Diagnosis not present

## 2022-09-14 DIAGNOSIS — N186 End stage renal disease: Secondary | ICD-10-CM | POA: Diagnosis not present

## 2022-09-14 DIAGNOSIS — R197 Diarrhea, unspecified: Secondary | ICD-10-CM | POA: Diagnosis not present

## 2022-09-14 DIAGNOSIS — N2581 Secondary hyperparathyroidism of renal origin: Secondary | ICD-10-CM | POA: Diagnosis not present

## 2022-09-14 DIAGNOSIS — Z992 Dependence on renal dialysis: Secondary | ICD-10-CM | POA: Diagnosis not present

## 2022-09-17 DIAGNOSIS — N2581 Secondary hyperparathyroidism of renal origin: Secondary | ICD-10-CM | POA: Diagnosis not present

## 2022-09-17 DIAGNOSIS — N186 End stage renal disease: Secondary | ICD-10-CM | POA: Diagnosis not present

## 2022-09-17 DIAGNOSIS — D631 Anemia in chronic kidney disease: Secondary | ICD-10-CM | POA: Diagnosis not present

## 2022-09-17 DIAGNOSIS — Z992 Dependence on renal dialysis: Secondary | ICD-10-CM | POA: Diagnosis not present

## 2022-09-19 DIAGNOSIS — N2581 Secondary hyperparathyroidism of renal origin: Secondary | ICD-10-CM | POA: Diagnosis not present

## 2022-09-19 DIAGNOSIS — E1122 Type 2 diabetes mellitus with diabetic chronic kidney disease: Secondary | ICD-10-CM | POA: Diagnosis not present

## 2022-09-19 DIAGNOSIS — Z23 Encounter for immunization: Secondary | ICD-10-CM | POA: Diagnosis not present

## 2022-09-19 DIAGNOSIS — Z992 Dependence on renal dialysis: Secondary | ICD-10-CM | POA: Diagnosis not present

## 2022-09-19 DIAGNOSIS — R197 Diarrhea, unspecified: Secondary | ICD-10-CM | POA: Diagnosis not present

## 2022-09-19 DIAGNOSIS — N186 End stage renal disease: Secondary | ICD-10-CM | POA: Diagnosis not present

## 2022-09-19 DIAGNOSIS — D631 Anemia in chronic kidney disease: Secondary | ICD-10-CM | POA: Diagnosis not present

## 2022-09-21 DIAGNOSIS — Z992 Dependence on renal dialysis: Secondary | ICD-10-CM | POA: Diagnosis not present

## 2022-09-21 DIAGNOSIS — N2581 Secondary hyperparathyroidism of renal origin: Secondary | ICD-10-CM | POA: Diagnosis not present

## 2022-09-21 DIAGNOSIS — D631 Anemia in chronic kidney disease: Secondary | ICD-10-CM | POA: Diagnosis not present

## 2022-09-21 DIAGNOSIS — Z23 Encounter for immunization: Secondary | ICD-10-CM | POA: Diagnosis not present

## 2022-09-21 DIAGNOSIS — R197 Diarrhea, unspecified: Secondary | ICD-10-CM | POA: Diagnosis not present

## 2022-09-21 DIAGNOSIS — E1122 Type 2 diabetes mellitus with diabetic chronic kidney disease: Secondary | ICD-10-CM | POA: Diagnosis not present

## 2022-09-21 DIAGNOSIS — N186 End stage renal disease: Secondary | ICD-10-CM | POA: Diagnosis not present

## 2022-09-24 DIAGNOSIS — D631 Anemia in chronic kidney disease: Secondary | ICD-10-CM | POA: Diagnosis not present

## 2022-09-24 DIAGNOSIS — N186 End stage renal disease: Secondary | ICD-10-CM | POA: Diagnosis not present

## 2022-09-24 DIAGNOSIS — G546 Phantom limb syndrome with pain: Secondary | ICD-10-CM | POA: Diagnosis not present

## 2022-09-24 DIAGNOSIS — N2581 Secondary hyperparathyroidism of renal origin: Secondary | ICD-10-CM | POA: Diagnosis not present

## 2022-09-24 DIAGNOSIS — E1122 Type 2 diabetes mellitus with diabetic chronic kidney disease: Secondary | ICD-10-CM | POA: Diagnosis not present

## 2022-09-24 DIAGNOSIS — Z23 Encounter for immunization: Secondary | ICD-10-CM | POA: Diagnosis not present

## 2022-09-24 DIAGNOSIS — I10 Essential (primary) hypertension: Secondary | ICD-10-CM | POA: Diagnosis not present

## 2022-09-24 DIAGNOSIS — E785 Hyperlipidemia, unspecified: Secondary | ICD-10-CM | POA: Diagnosis not present

## 2022-09-24 DIAGNOSIS — I639 Cerebral infarction, unspecified: Secondary | ICD-10-CM | POA: Diagnosis not present

## 2022-09-24 DIAGNOSIS — Z992 Dependence on renal dialysis: Secondary | ICD-10-CM | POA: Diagnosis not present

## 2022-09-24 DIAGNOSIS — G4700 Insomnia, unspecified: Secondary | ICD-10-CM | POA: Diagnosis not present

## 2022-09-24 DIAGNOSIS — R197 Diarrhea, unspecified: Secondary | ICD-10-CM | POA: Diagnosis not present

## 2022-09-26 DIAGNOSIS — R197 Diarrhea, unspecified: Secondary | ICD-10-CM | POA: Diagnosis not present

## 2022-09-26 DIAGNOSIS — D631 Anemia in chronic kidney disease: Secondary | ICD-10-CM | POA: Diagnosis not present

## 2022-09-26 DIAGNOSIS — E1122 Type 2 diabetes mellitus with diabetic chronic kidney disease: Secondary | ICD-10-CM | POA: Diagnosis not present

## 2022-09-26 DIAGNOSIS — N2581 Secondary hyperparathyroidism of renal origin: Secondary | ICD-10-CM | POA: Diagnosis not present

## 2022-09-26 DIAGNOSIS — Z992 Dependence on renal dialysis: Secondary | ICD-10-CM | POA: Diagnosis not present

## 2022-09-26 DIAGNOSIS — Z23 Encounter for immunization: Secondary | ICD-10-CM | POA: Diagnosis not present

## 2022-09-26 DIAGNOSIS — N186 End stage renal disease: Secondary | ICD-10-CM | POA: Diagnosis not present

## 2022-09-28 DIAGNOSIS — Z23 Encounter for immunization: Secondary | ICD-10-CM | POA: Diagnosis not present

## 2022-09-28 DIAGNOSIS — N2581 Secondary hyperparathyroidism of renal origin: Secondary | ICD-10-CM | POA: Diagnosis not present

## 2022-09-28 DIAGNOSIS — E1122 Type 2 diabetes mellitus with diabetic chronic kidney disease: Secondary | ICD-10-CM | POA: Diagnosis not present

## 2022-09-28 DIAGNOSIS — R197 Diarrhea, unspecified: Secondary | ICD-10-CM | POA: Diagnosis not present

## 2022-09-28 DIAGNOSIS — N186 End stage renal disease: Secondary | ICD-10-CM | POA: Diagnosis not present

## 2022-09-28 DIAGNOSIS — Z992 Dependence on renal dialysis: Secondary | ICD-10-CM | POA: Diagnosis not present

## 2022-09-28 DIAGNOSIS — D631 Anemia in chronic kidney disease: Secondary | ICD-10-CM | POA: Diagnosis not present

## 2022-10-01 DIAGNOSIS — Z23 Encounter for immunization: Secondary | ICD-10-CM | POA: Diagnosis not present

## 2022-10-01 DIAGNOSIS — N2581 Secondary hyperparathyroidism of renal origin: Secondary | ICD-10-CM | POA: Diagnosis not present

## 2022-10-01 DIAGNOSIS — Z992 Dependence on renal dialysis: Secondary | ICD-10-CM | POA: Diagnosis not present

## 2022-10-01 DIAGNOSIS — D631 Anemia in chronic kidney disease: Secondary | ICD-10-CM | POA: Diagnosis not present

## 2022-10-01 DIAGNOSIS — E1122 Type 2 diabetes mellitus with diabetic chronic kidney disease: Secondary | ICD-10-CM | POA: Diagnosis not present

## 2022-10-01 DIAGNOSIS — R197 Diarrhea, unspecified: Secondary | ICD-10-CM | POA: Diagnosis not present

## 2022-10-01 DIAGNOSIS — N186 End stage renal disease: Secondary | ICD-10-CM | POA: Diagnosis not present

## 2022-10-02 ENCOUNTER — Ambulatory Visit (INDEPENDENT_AMBULATORY_CARE_PROVIDER_SITE_OTHER): Payer: 59 | Admitting: Family

## 2022-10-02 ENCOUNTER — Encounter: Payer: Self-pay | Admitting: Family

## 2022-10-02 DIAGNOSIS — Z89512 Acquired absence of left leg below knee: Secondary | ICD-10-CM | POA: Diagnosis not present

## 2022-10-02 DIAGNOSIS — Z89511 Acquired absence of right leg below knee: Secondary | ICD-10-CM
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
## 2022-10-03 DIAGNOSIS — D631 Anemia in chronic kidney disease: Secondary | ICD-10-CM | POA: Diagnosis not present

## 2022-10-03 DIAGNOSIS — E1122 Type 2 diabetes mellitus with diabetic chronic kidney disease: Secondary | ICD-10-CM | POA: Diagnosis not present

## 2022-10-03 DIAGNOSIS — Z23 Encounter for immunization: Secondary | ICD-10-CM | POA: Diagnosis not present

## 2022-10-03 DIAGNOSIS — N2581 Secondary hyperparathyroidism of renal origin: Secondary | ICD-10-CM | POA: Diagnosis not present

## 2022-10-03 DIAGNOSIS — R197 Diarrhea, unspecified: Secondary | ICD-10-CM | POA: Diagnosis not present

## 2022-10-03 DIAGNOSIS — Z992 Dependence on renal dialysis: Secondary | ICD-10-CM | POA: Diagnosis not present

## 2022-10-03 DIAGNOSIS — N186 End stage renal disease: Secondary | ICD-10-CM | POA: Diagnosis not present

## 2022-10-05 DIAGNOSIS — R197 Diarrhea, unspecified: Secondary | ICD-10-CM | POA: Diagnosis not present

## 2022-10-05 DIAGNOSIS — E1122 Type 2 diabetes mellitus with diabetic chronic kidney disease: Secondary | ICD-10-CM | POA: Diagnosis not present

## 2022-10-05 DIAGNOSIS — D631 Anemia in chronic kidney disease: Secondary | ICD-10-CM | POA: Diagnosis not present

## 2022-10-05 DIAGNOSIS — N186 End stage renal disease: Secondary | ICD-10-CM | POA: Diagnosis not present

## 2022-10-05 DIAGNOSIS — N2581 Secondary hyperparathyroidism of renal origin: Secondary | ICD-10-CM | POA: Diagnosis not present

## 2022-10-05 DIAGNOSIS — Z992 Dependence on renal dialysis: Secondary | ICD-10-CM | POA: Diagnosis not present

## 2022-10-05 DIAGNOSIS — Z23 Encounter for immunization: Secondary | ICD-10-CM | POA: Diagnosis not present

## 2022-10-08 DIAGNOSIS — E1122 Type 2 diabetes mellitus with diabetic chronic kidney disease: Secondary | ICD-10-CM | POA: Diagnosis not present

## 2022-10-08 DIAGNOSIS — Z23 Encounter for immunization: Secondary | ICD-10-CM | POA: Diagnosis not present

## 2022-10-08 DIAGNOSIS — D631 Anemia in chronic kidney disease: Secondary | ICD-10-CM | POA: Diagnosis not present

## 2022-10-08 DIAGNOSIS — N186 End stage renal disease: Secondary | ICD-10-CM | POA: Diagnosis not present

## 2022-10-08 DIAGNOSIS — Z992 Dependence on renal dialysis: Secondary | ICD-10-CM | POA: Diagnosis not present

## 2022-10-08 DIAGNOSIS — N2581 Secondary hyperparathyroidism of renal origin: Secondary | ICD-10-CM | POA: Diagnosis not present

## 2022-10-08 DIAGNOSIS — R197 Diarrhea, unspecified: Secondary | ICD-10-CM | POA: Diagnosis not present

## 2022-10-10 DIAGNOSIS — N186 End stage renal disease: Secondary | ICD-10-CM | POA: Diagnosis not present

## 2022-10-10 DIAGNOSIS — D631 Anemia in chronic kidney disease: Secondary | ICD-10-CM | POA: Diagnosis not present

## 2022-10-10 DIAGNOSIS — N2581 Secondary hyperparathyroidism of renal origin: Secondary | ICD-10-CM | POA: Diagnosis not present

## 2022-10-10 DIAGNOSIS — E1122 Type 2 diabetes mellitus with diabetic chronic kidney disease: Secondary | ICD-10-CM | POA: Diagnosis not present

## 2022-10-10 DIAGNOSIS — R197 Diarrhea, unspecified: Secondary | ICD-10-CM | POA: Diagnosis not present

## 2022-10-10 DIAGNOSIS — Z992 Dependence on renal dialysis: Secondary | ICD-10-CM | POA: Diagnosis not present

## 2022-10-12 DIAGNOSIS — D631 Anemia in chronic kidney disease: Secondary | ICD-10-CM | POA: Diagnosis not present

## 2022-10-12 DIAGNOSIS — R197 Diarrhea, unspecified: Secondary | ICD-10-CM | POA: Diagnosis not present

## 2022-10-12 DIAGNOSIS — Z992 Dependence on renal dialysis: Secondary | ICD-10-CM | POA: Diagnosis not present

## 2022-10-12 DIAGNOSIS — N2581 Secondary hyperparathyroidism of renal origin: Secondary | ICD-10-CM | POA: Diagnosis not present

## 2022-10-12 DIAGNOSIS — E1122 Type 2 diabetes mellitus with diabetic chronic kidney disease: Secondary | ICD-10-CM | POA: Diagnosis not present

## 2022-10-12 DIAGNOSIS — N186 End stage renal disease: Secondary | ICD-10-CM | POA: Diagnosis not present

## 2022-10-15 DIAGNOSIS — D631 Anemia in chronic kidney disease: Secondary | ICD-10-CM | POA: Diagnosis not present

## 2022-10-15 DIAGNOSIS — E1122 Type 2 diabetes mellitus with diabetic chronic kidney disease: Secondary | ICD-10-CM | POA: Diagnosis not present

## 2022-10-15 DIAGNOSIS — R197 Diarrhea, unspecified: Secondary | ICD-10-CM | POA: Diagnosis not present

## 2022-10-15 DIAGNOSIS — N186 End stage renal disease: Secondary | ICD-10-CM | POA: Diagnosis not present

## 2022-10-15 DIAGNOSIS — Z992 Dependence on renal dialysis: Secondary | ICD-10-CM | POA: Diagnosis not present

## 2022-10-15 DIAGNOSIS — N2581 Secondary hyperparathyroidism of renal origin: Secondary | ICD-10-CM | POA: Diagnosis not present

## 2022-10-17 DIAGNOSIS — D631 Anemia in chronic kidney disease: Secondary | ICD-10-CM | POA: Diagnosis not present

## 2022-10-17 DIAGNOSIS — N2581 Secondary hyperparathyroidism of renal origin: Secondary | ICD-10-CM | POA: Diagnosis not present

## 2022-10-17 DIAGNOSIS — M6281 Muscle weakness (generalized): Secondary | ICD-10-CM | POA: Diagnosis not present

## 2022-10-17 DIAGNOSIS — I629 Nontraumatic intracranial hemorrhage, unspecified: Secondary | ICD-10-CM | POA: Diagnosis not present

## 2022-10-17 DIAGNOSIS — R2689 Other abnormalities of gait and mobility: Secondary | ICD-10-CM | POA: Diagnosis not present

## 2022-10-17 DIAGNOSIS — E1122 Type 2 diabetes mellitus with diabetic chronic kidney disease: Secondary | ICD-10-CM | POA: Diagnosis not present

## 2022-10-17 DIAGNOSIS — R197 Diarrhea, unspecified: Secondary | ICD-10-CM | POA: Diagnosis not present

## 2022-10-17 DIAGNOSIS — Z992 Dependence on renal dialysis: Secondary | ICD-10-CM | POA: Diagnosis not present

## 2022-10-17 DIAGNOSIS — N186 End stage renal disease: Secondary | ICD-10-CM | POA: Diagnosis not present

## 2022-10-19 DIAGNOSIS — N2581 Secondary hyperparathyroidism of renal origin: Secondary | ICD-10-CM | POA: Diagnosis not present

## 2022-10-19 DIAGNOSIS — G629 Polyneuropathy, unspecified: Secondary | ICD-10-CM | POA: Diagnosis not present

## 2022-10-19 DIAGNOSIS — G546 Phantom limb syndrome with pain: Secondary | ICD-10-CM | POA: Diagnosis not present

## 2022-10-19 DIAGNOSIS — N186 End stage renal disease: Secondary | ICD-10-CM | POA: Diagnosis not present

## 2022-10-19 DIAGNOSIS — Z992 Dependence on renal dialysis: Secondary | ICD-10-CM | POA: Diagnosis not present

## 2022-10-19 DIAGNOSIS — E1122 Type 2 diabetes mellitus with diabetic chronic kidney disease: Secondary | ICD-10-CM | POA: Diagnosis not present

## 2022-10-19 DIAGNOSIS — D631 Anemia in chronic kidney disease: Secondary | ICD-10-CM | POA: Diagnosis not present

## 2022-10-19 DIAGNOSIS — R197 Diarrhea, unspecified: Secondary | ICD-10-CM | POA: Diagnosis not present

## 2022-10-22 DIAGNOSIS — N2581 Secondary hyperparathyroidism of renal origin: Secondary | ICD-10-CM | POA: Diagnosis not present

## 2022-10-22 DIAGNOSIS — M6281 Muscle weakness (generalized): Secondary | ICD-10-CM | POA: Diagnosis not present

## 2022-10-22 DIAGNOSIS — I629 Nontraumatic intracranial hemorrhage, unspecified: Secondary | ICD-10-CM | POA: Diagnosis not present

## 2022-10-22 DIAGNOSIS — Z992 Dependence on renal dialysis: Secondary | ICD-10-CM | POA: Diagnosis not present

## 2022-10-22 DIAGNOSIS — D631 Anemia in chronic kidney disease: Secondary | ICD-10-CM | POA: Diagnosis not present

## 2022-10-22 DIAGNOSIS — E1122 Type 2 diabetes mellitus with diabetic chronic kidney disease: Secondary | ICD-10-CM | POA: Diagnosis not present

## 2022-10-22 DIAGNOSIS — N186 End stage renal disease: Secondary | ICD-10-CM | POA: Diagnosis not present

## 2022-10-22 DIAGNOSIS — R197 Diarrhea, unspecified: Secondary | ICD-10-CM | POA: Diagnosis not present

## 2022-10-22 DIAGNOSIS — R2689 Other abnormalities of gait and mobility: Secondary | ICD-10-CM | POA: Diagnosis not present

## 2022-10-23 DIAGNOSIS — N186 End stage renal disease: Secondary | ICD-10-CM | POA: Diagnosis not present

## 2022-10-23 DIAGNOSIS — M6281 Muscle weakness (generalized): Secondary | ICD-10-CM | POA: Diagnosis not present

## 2022-10-23 DIAGNOSIS — I629 Nontraumatic intracranial hemorrhage, unspecified: Secondary | ICD-10-CM | POA: Diagnosis not present

## 2022-10-23 DIAGNOSIS — R2689 Other abnormalities of gait and mobility: Secondary | ICD-10-CM | POA: Diagnosis not present

## 2022-10-24 DIAGNOSIS — R197 Diarrhea, unspecified: Secondary | ICD-10-CM | POA: Diagnosis not present

## 2022-10-24 DIAGNOSIS — E1122 Type 2 diabetes mellitus with diabetic chronic kidney disease: Secondary | ICD-10-CM | POA: Diagnosis not present

## 2022-10-24 DIAGNOSIS — N186 End stage renal disease: Secondary | ICD-10-CM | POA: Diagnosis not present

## 2022-10-24 DIAGNOSIS — N2581 Secondary hyperparathyroidism of renal origin: Secondary | ICD-10-CM | POA: Diagnosis not present

## 2022-10-24 DIAGNOSIS — D631 Anemia in chronic kidney disease: Secondary | ICD-10-CM | POA: Diagnosis not present

## 2022-10-24 DIAGNOSIS — Z992 Dependence on renal dialysis: Secondary | ICD-10-CM | POA: Diagnosis not present

## 2022-10-26 DIAGNOSIS — N186 End stage renal disease: Secondary | ICD-10-CM | POA: Diagnosis not present

## 2022-10-26 DIAGNOSIS — R197 Diarrhea, unspecified: Secondary | ICD-10-CM | POA: Diagnosis not present

## 2022-10-26 DIAGNOSIS — Z992 Dependence on renal dialysis: Secondary | ICD-10-CM | POA: Diagnosis not present

## 2022-10-26 DIAGNOSIS — E1122 Type 2 diabetes mellitus with diabetic chronic kidney disease: Secondary | ICD-10-CM | POA: Diagnosis not present

## 2022-10-26 DIAGNOSIS — D631 Anemia in chronic kidney disease: Secondary | ICD-10-CM | POA: Diagnosis not present

## 2022-10-26 DIAGNOSIS — N2581 Secondary hyperparathyroidism of renal origin: Secondary | ICD-10-CM | POA: Diagnosis not present

## 2022-10-27 DIAGNOSIS — M6281 Muscle weakness (generalized): Secondary | ICD-10-CM | POA: Diagnosis not present

## 2022-10-27 DIAGNOSIS — N186 End stage renal disease: Secondary | ICD-10-CM | POA: Diagnosis not present

## 2022-10-27 DIAGNOSIS — R2689 Other abnormalities of gait and mobility: Secondary | ICD-10-CM | POA: Diagnosis not present

## 2022-10-27 DIAGNOSIS — I629 Nontraumatic intracranial hemorrhage, unspecified: Secondary | ICD-10-CM | POA: Diagnosis not present

## 2022-10-29 DIAGNOSIS — N2581 Secondary hyperparathyroidism of renal origin: Secondary | ICD-10-CM | POA: Diagnosis not present

## 2022-10-29 DIAGNOSIS — N186 End stage renal disease: Secondary | ICD-10-CM | POA: Diagnosis not present

## 2022-10-29 DIAGNOSIS — E1122 Type 2 diabetes mellitus with diabetic chronic kidney disease: Secondary | ICD-10-CM | POA: Diagnosis not present

## 2022-10-29 DIAGNOSIS — D631 Anemia in chronic kidney disease: Secondary | ICD-10-CM | POA: Diagnosis not present

## 2022-10-29 DIAGNOSIS — R197 Diarrhea, unspecified: Secondary | ICD-10-CM | POA: Diagnosis not present

## 2022-10-29 DIAGNOSIS — Z992 Dependence on renal dialysis: Secondary | ICD-10-CM | POA: Diagnosis not present

## 2022-10-30 DIAGNOSIS — M79605 Pain in left leg: Secondary | ICD-10-CM | POA: Diagnosis not present

## 2022-10-31 DIAGNOSIS — N2581 Secondary hyperparathyroidism of renal origin: Secondary | ICD-10-CM | POA: Diagnosis not present

## 2022-10-31 DIAGNOSIS — R197 Diarrhea, unspecified: Secondary | ICD-10-CM | POA: Diagnosis not present

## 2022-10-31 DIAGNOSIS — Z992 Dependence on renal dialysis: Secondary | ICD-10-CM | POA: Diagnosis not present

## 2022-10-31 DIAGNOSIS — E1122 Type 2 diabetes mellitus with diabetic chronic kidney disease: Secondary | ICD-10-CM | POA: Diagnosis not present

## 2022-10-31 DIAGNOSIS — N186 End stage renal disease: Secondary | ICD-10-CM | POA: Diagnosis not present

## 2022-10-31 DIAGNOSIS — D631 Anemia in chronic kidney disease: Secondary | ICD-10-CM | POA: Diagnosis not present

## 2022-11-01 DIAGNOSIS — I629 Nontraumatic intracranial hemorrhage, unspecified: Secondary | ICD-10-CM | POA: Diagnosis not present

## 2022-11-01 DIAGNOSIS — N186 End stage renal disease: Secondary | ICD-10-CM | POA: Diagnosis not present

## 2022-11-01 DIAGNOSIS — R2689 Other abnormalities of gait and mobility: Secondary | ICD-10-CM | POA: Diagnosis not present

## 2022-11-01 DIAGNOSIS — M6281 Muscle weakness (generalized): Secondary | ICD-10-CM | POA: Diagnosis not present

## 2022-11-02 DIAGNOSIS — D631 Anemia in chronic kidney disease: Secondary | ICD-10-CM | POA: Diagnosis not present

## 2022-11-02 DIAGNOSIS — Z992 Dependence on renal dialysis: Secondary | ICD-10-CM | POA: Diagnosis not present

## 2022-11-02 DIAGNOSIS — N186 End stage renal disease: Secondary | ICD-10-CM | POA: Diagnosis not present

## 2022-11-02 DIAGNOSIS — R197 Diarrhea, unspecified: Secondary | ICD-10-CM | POA: Diagnosis not present

## 2022-11-02 DIAGNOSIS — E1122 Type 2 diabetes mellitus with diabetic chronic kidney disease: Secondary | ICD-10-CM | POA: Diagnosis not present

## 2022-11-02 DIAGNOSIS — N2581 Secondary hyperparathyroidism of renal origin: Secondary | ICD-10-CM | POA: Diagnosis not present

## 2022-11-04 DIAGNOSIS — N186 End stage renal disease: Secondary | ICD-10-CM | POA: Diagnosis not present

## 2022-11-04 DIAGNOSIS — M6281 Muscle weakness (generalized): Secondary | ICD-10-CM | POA: Diagnosis not present

## 2022-11-04 DIAGNOSIS — R2689 Other abnormalities of gait and mobility: Secondary | ICD-10-CM | POA: Diagnosis not present

## 2022-11-04 DIAGNOSIS — I629 Nontraumatic intracranial hemorrhage, unspecified: Secondary | ICD-10-CM | POA: Diagnosis not present

## 2022-11-05 DIAGNOSIS — D631 Anemia in chronic kidney disease: Secondary | ICD-10-CM | POA: Diagnosis not present

## 2022-11-05 DIAGNOSIS — R197 Diarrhea, unspecified: Secondary | ICD-10-CM | POA: Diagnosis not present

## 2022-11-05 DIAGNOSIS — E1122 Type 2 diabetes mellitus with diabetic chronic kidney disease: Secondary | ICD-10-CM | POA: Diagnosis not present

## 2022-11-05 DIAGNOSIS — N186 End stage renal disease: Secondary | ICD-10-CM | POA: Diagnosis not present

## 2022-11-05 DIAGNOSIS — Z992 Dependence on renal dialysis: Secondary | ICD-10-CM | POA: Diagnosis not present

## 2022-11-05 DIAGNOSIS — N2581 Secondary hyperparathyroidism of renal origin: Secondary | ICD-10-CM | POA: Diagnosis not present

## 2022-11-06 DIAGNOSIS — M6281 Muscle weakness (generalized): Secondary | ICD-10-CM | POA: Diagnosis not present

## 2022-11-06 DIAGNOSIS — R2689 Other abnormalities of gait and mobility: Secondary | ICD-10-CM | POA: Diagnosis not present

## 2022-11-06 DIAGNOSIS — N186 End stage renal disease: Secondary | ICD-10-CM | POA: Diagnosis not present

## 2022-11-06 DIAGNOSIS — I629 Nontraumatic intracranial hemorrhage, unspecified: Secondary | ICD-10-CM | POA: Diagnosis not present

## 2022-11-07 DIAGNOSIS — R197 Diarrhea, unspecified: Secondary | ICD-10-CM | POA: Diagnosis not present

## 2022-11-07 DIAGNOSIS — D631 Anemia in chronic kidney disease: Secondary | ICD-10-CM | POA: Diagnosis not present

## 2022-11-07 DIAGNOSIS — Z992 Dependence on renal dialysis: Secondary | ICD-10-CM | POA: Diagnosis not present

## 2022-11-07 DIAGNOSIS — N2581 Secondary hyperparathyroidism of renal origin: Secondary | ICD-10-CM | POA: Diagnosis not present

## 2022-11-07 DIAGNOSIS — E1122 Type 2 diabetes mellitus with diabetic chronic kidney disease: Secondary | ICD-10-CM | POA: Diagnosis not present

## 2022-11-07 DIAGNOSIS — N186 End stage renal disease: Secondary | ICD-10-CM | POA: Diagnosis not present

## 2022-11-08 DIAGNOSIS — E1122 Type 2 diabetes mellitus with diabetic chronic kidney disease: Secondary | ICD-10-CM | POA: Diagnosis not present

## 2022-11-08 DIAGNOSIS — N186 End stage renal disease: Secondary | ICD-10-CM | POA: Diagnosis not present

## 2022-11-08 DIAGNOSIS — Z992 Dependence on renal dialysis: Secondary | ICD-10-CM | POA: Diagnosis not present

## 2022-11-09 DIAGNOSIS — R197 Diarrhea, unspecified: Secondary | ICD-10-CM | POA: Diagnosis not present

## 2022-11-09 DIAGNOSIS — Z992 Dependence on renal dialysis: Secondary | ICD-10-CM | POA: Diagnosis not present

## 2022-11-09 DIAGNOSIS — E1122 Type 2 diabetes mellitus with diabetic chronic kidney disease: Secondary | ICD-10-CM | POA: Diagnosis not present

## 2022-11-09 DIAGNOSIS — N2581 Secondary hyperparathyroidism of renal origin: Secondary | ICD-10-CM | POA: Diagnosis not present

## 2022-11-09 DIAGNOSIS — N186 End stage renal disease: Secondary | ICD-10-CM | POA: Diagnosis not present

## 2022-11-09 DIAGNOSIS — D631 Anemia in chronic kidney disease: Secondary | ICD-10-CM | POA: Diagnosis not present

## 2022-11-12 DIAGNOSIS — Z992 Dependence on renal dialysis: Secondary | ICD-10-CM | POA: Diagnosis not present

## 2022-11-12 DIAGNOSIS — N2581 Secondary hyperparathyroidism of renal origin: Secondary | ICD-10-CM | POA: Diagnosis not present

## 2022-11-12 DIAGNOSIS — N186 End stage renal disease: Secondary | ICD-10-CM | POA: Diagnosis not present

## 2022-11-12 DIAGNOSIS — D631 Anemia in chronic kidney disease: Secondary | ICD-10-CM | POA: Diagnosis not present

## 2022-11-12 DIAGNOSIS — R197 Diarrhea, unspecified: Secondary | ICD-10-CM | POA: Diagnosis not present

## 2022-11-12 DIAGNOSIS — E1122 Type 2 diabetes mellitus with diabetic chronic kidney disease: Secondary | ICD-10-CM | POA: Diagnosis not present

## 2022-11-14 DIAGNOSIS — Z992 Dependence on renal dialysis: Secondary | ICD-10-CM | POA: Diagnosis not present

## 2022-11-14 DIAGNOSIS — D631 Anemia in chronic kidney disease: Secondary | ICD-10-CM | POA: Diagnosis not present

## 2022-11-14 DIAGNOSIS — R197 Diarrhea, unspecified: Secondary | ICD-10-CM | POA: Diagnosis not present

## 2022-11-14 DIAGNOSIS — N2581 Secondary hyperparathyroidism of renal origin: Secondary | ICD-10-CM | POA: Diagnosis not present

## 2022-11-14 DIAGNOSIS — E1122 Type 2 diabetes mellitus with diabetic chronic kidney disease: Secondary | ICD-10-CM | POA: Diagnosis not present

## 2022-11-14 DIAGNOSIS — N186 End stage renal disease: Secondary | ICD-10-CM | POA: Diagnosis not present

## 2022-11-16 DIAGNOSIS — R197 Diarrhea, unspecified: Secondary | ICD-10-CM | POA: Diagnosis not present

## 2022-11-16 DIAGNOSIS — D631 Anemia in chronic kidney disease: Secondary | ICD-10-CM | POA: Diagnosis not present

## 2022-11-16 DIAGNOSIS — N186 End stage renal disease: Secondary | ICD-10-CM | POA: Diagnosis not present

## 2022-11-16 DIAGNOSIS — Z992 Dependence on renal dialysis: Secondary | ICD-10-CM | POA: Diagnosis not present

## 2022-11-16 DIAGNOSIS — E1122 Type 2 diabetes mellitus with diabetic chronic kidney disease: Secondary | ICD-10-CM | POA: Diagnosis not present

## 2022-11-16 DIAGNOSIS — N2581 Secondary hyperparathyroidism of renal origin: Secondary | ICD-10-CM | POA: Diagnosis not present
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
## 2022-11-19 DIAGNOSIS — Z992 Dependence on renal dialysis: Secondary | ICD-10-CM | POA: Diagnosis not present

## 2022-11-19 DIAGNOSIS — N186 End stage renal disease: Secondary | ICD-10-CM | POA: Diagnosis not present

## 2022-11-19 DIAGNOSIS — E1122 Type 2 diabetes mellitus with diabetic chronic kidney disease: Secondary | ICD-10-CM | POA: Diagnosis not present

## 2022-11-19 DIAGNOSIS — R197 Diarrhea, unspecified: Secondary | ICD-10-CM | POA: Diagnosis not present

## 2022-11-19 DIAGNOSIS — N2581 Secondary hyperparathyroidism of renal origin: Secondary | ICD-10-CM | POA: Diagnosis not present

## 2022-11-19 DIAGNOSIS — D631 Anemia in chronic kidney disease: Secondary | ICD-10-CM | POA: Diagnosis not present

## 2022-11-20 ENCOUNTER — Ambulatory Visit: Payer: 59 | Admitting: Cardiology

## 2022-11-20 DIAGNOSIS — Z89512 Acquired absence of left leg below knee: Secondary | ICD-10-CM | POA: Diagnosis not present

## 2022-11-20 DIAGNOSIS — Z89511 Acquired absence of right leg below knee: Secondary | ICD-10-CM | POA: Diagnosis not present

## 2022-11-21 DIAGNOSIS — D631 Anemia in chronic kidney disease: Secondary | ICD-10-CM | POA: Diagnosis not present

## 2022-11-21 DIAGNOSIS — E1122 Type 2 diabetes mellitus with diabetic chronic kidney disease: Secondary | ICD-10-CM | POA: Diagnosis not present

## 2022-11-21 DIAGNOSIS — N2581 Secondary hyperparathyroidism of renal origin: Secondary | ICD-10-CM | POA: Diagnosis not present

## 2022-11-21 DIAGNOSIS — Z992 Dependence on renal dialysis: Secondary | ICD-10-CM | POA: Diagnosis not present

## 2022-11-21 DIAGNOSIS — N186 End stage renal disease: Secondary | ICD-10-CM | POA: Diagnosis not present

## 2022-11-21 DIAGNOSIS — R197 Diarrhea, unspecified: Secondary | ICD-10-CM | POA: Diagnosis not present

## 2022-11-23 DIAGNOSIS — Z992 Dependence on renal dialysis: Secondary | ICD-10-CM | POA: Diagnosis not present

## 2022-11-23 DIAGNOSIS — R197 Diarrhea, unspecified: Secondary | ICD-10-CM | POA: Diagnosis not present

## 2022-11-23 DIAGNOSIS — N186 End stage renal disease: Secondary | ICD-10-CM | POA: Diagnosis not present

## 2022-11-23 DIAGNOSIS — D631 Anemia in chronic kidney disease: Secondary | ICD-10-CM | POA: Diagnosis not present

## 2022-11-23 DIAGNOSIS — E1122 Type 2 diabetes mellitus with diabetic chronic kidney disease: Secondary | ICD-10-CM | POA: Diagnosis not present

## 2022-11-23 DIAGNOSIS — N2581 Secondary hyperparathyroidism of renal origin: Secondary | ICD-10-CM | POA: Diagnosis not present

## 2022-11-26 DIAGNOSIS — N186 End stage renal disease: Secondary | ICD-10-CM | POA: Diagnosis not present

## 2022-11-26 DIAGNOSIS — Z992 Dependence on renal dialysis: Secondary | ICD-10-CM | POA: Diagnosis not present

## 2022-11-26 DIAGNOSIS — E1122 Type 2 diabetes mellitus with diabetic chronic kidney disease: Secondary | ICD-10-CM | POA: Diagnosis not present

## 2022-11-26 DIAGNOSIS — D631 Anemia in chronic kidney disease: Secondary | ICD-10-CM | POA: Diagnosis not present

## 2022-11-26 DIAGNOSIS — N2581 Secondary hyperparathyroidism of renal origin: Secondary | ICD-10-CM | POA: Diagnosis not present

## 2022-11-26 DIAGNOSIS — R197 Diarrhea, unspecified: Secondary | ICD-10-CM | POA: Diagnosis not present

## 2022-11-27 DIAGNOSIS — N186 End stage renal disease: Secondary | ICD-10-CM | POA: Diagnosis not present

## 2022-11-27 DIAGNOSIS — I10 Essential (primary) hypertension: Secondary | ICD-10-CM | POA: Diagnosis not present

## 2022-11-27 DIAGNOSIS — G8929 Other chronic pain: Secondary | ICD-10-CM | POA: Diagnosis not present

## 2022-11-28 DIAGNOSIS — N186 End stage renal disease: Secondary | ICD-10-CM | POA: Diagnosis not present

## 2022-11-28 DIAGNOSIS — D631 Anemia in chronic kidney disease: Secondary | ICD-10-CM | POA: Diagnosis not present

## 2022-11-28 DIAGNOSIS — E1122 Type 2 diabetes mellitus with diabetic chronic kidney disease: Secondary | ICD-10-CM | POA: Diagnosis not present

## 2022-11-28 DIAGNOSIS — N2581 Secondary hyperparathyroidism of renal origin: Secondary | ICD-10-CM | POA: Diagnosis not present

## 2022-11-28 DIAGNOSIS — Z992 Dependence on renal dialysis: Secondary | ICD-10-CM | POA: Diagnosis not present

## 2022-11-28 DIAGNOSIS — R197 Diarrhea, unspecified: Secondary | ICD-10-CM | POA: Diagnosis not present

## 2022-11-30 DIAGNOSIS — Z992 Dependence on renal dialysis: Secondary | ICD-10-CM | POA: Diagnosis not present

## 2022-11-30 DIAGNOSIS — N2581 Secondary hyperparathyroidism of renal origin: Secondary | ICD-10-CM | POA: Diagnosis not present

## 2022-11-30 DIAGNOSIS — R197 Diarrhea, unspecified: Secondary | ICD-10-CM | POA: Diagnosis not present

## 2022-11-30 DIAGNOSIS — N186 End stage renal disease: Secondary | ICD-10-CM | POA: Diagnosis not present

## 2022-11-30 DIAGNOSIS — E1122 Type 2 diabetes mellitus with diabetic chronic kidney disease: Secondary | ICD-10-CM | POA: Diagnosis not present

## 2022-11-30 DIAGNOSIS — D631 Anemia in chronic kidney disease: Secondary | ICD-10-CM | POA: Diagnosis not present

## 2022-12-02 DIAGNOSIS — N186 End stage renal disease: Secondary | ICD-10-CM | POA: Diagnosis not present

## 2022-12-02 DIAGNOSIS — N2581 Secondary hyperparathyroidism of renal origin: Secondary | ICD-10-CM | POA: Diagnosis not present

## 2022-12-02 DIAGNOSIS — Z992 Dependence on renal dialysis: Secondary | ICD-10-CM | POA: Diagnosis not present

## 2022-12-02 DIAGNOSIS — R197 Diarrhea, unspecified: Secondary | ICD-10-CM | POA: Diagnosis not present

## 2022-12-02 DIAGNOSIS — D631 Anemia in chronic kidney disease: Secondary | ICD-10-CM | POA: Diagnosis not present

## 2022-12-02 DIAGNOSIS — E1122 Type 2 diabetes mellitus with diabetic chronic kidney disease: Secondary | ICD-10-CM | POA: Diagnosis not present

## 2022-12-04 DIAGNOSIS — D631 Anemia in chronic kidney disease: Secondary | ICD-10-CM | POA: Diagnosis not present

## 2022-12-04 DIAGNOSIS — Z992 Dependence on renal dialysis: Secondary | ICD-10-CM | POA: Diagnosis not present

## 2022-12-04 DIAGNOSIS — R197 Diarrhea, unspecified: Secondary | ICD-10-CM | POA: Diagnosis not present

## 2022-12-04 DIAGNOSIS — N186 End stage renal disease: Secondary | ICD-10-CM | POA: Diagnosis not present

## 2022-12-04 DIAGNOSIS — E1122 Type 2 diabetes mellitus with diabetic chronic kidney disease: Secondary | ICD-10-CM | POA: Diagnosis not present

## 2022-12-04 DIAGNOSIS — N2581 Secondary hyperparathyroidism of renal origin: Secondary | ICD-10-CM | POA: Diagnosis not present

## 2022-12-07 DIAGNOSIS — Z992 Dependence on renal dialysis: Secondary | ICD-10-CM | POA: Diagnosis not present

## 2022-12-07 DIAGNOSIS — E1122 Type 2 diabetes mellitus with diabetic chronic kidney disease: Secondary | ICD-10-CM | POA: Diagnosis not present

## 2022-12-07 DIAGNOSIS — R197 Diarrhea, unspecified: Secondary | ICD-10-CM | POA: Diagnosis not present

## 2022-12-07 DIAGNOSIS — N2581 Secondary hyperparathyroidism of renal origin: Secondary | ICD-10-CM | POA: Diagnosis not present

## 2022-12-07 DIAGNOSIS — N186 End stage renal disease: Secondary | ICD-10-CM | POA: Diagnosis not present

## 2022-12-07 DIAGNOSIS — D631 Anemia in chronic kidney disease: Secondary | ICD-10-CM | POA: Diagnosis not present

## 2022-12-08 DIAGNOSIS — Z992 Dependence on renal dialysis: Secondary | ICD-10-CM | POA: Diagnosis not present

## 2022-12-08 DIAGNOSIS — E1122 Type 2 diabetes mellitus with diabetic chronic kidney disease: Secondary | ICD-10-CM | POA: Diagnosis not present

## 2022-12-08 DIAGNOSIS — N186 End stage renal disease: Secondary | ICD-10-CM | POA: Diagnosis not present

## 2022-12-10 DIAGNOSIS — D631 Anemia in chronic kidney disease: Secondary | ICD-10-CM | POA: Diagnosis not present

## 2022-12-10 DIAGNOSIS — E1122 Type 2 diabetes mellitus with diabetic chronic kidney disease: Secondary | ICD-10-CM | POA: Diagnosis not present

## 2022-12-10 DIAGNOSIS — N186 End stage renal disease: Secondary | ICD-10-CM | POA: Diagnosis not present

## 2022-12-10 DIAGNOSIS — Z992 Dependence on renal dialysis: Secondary | ICD-10-CM | POA: Diagnosis not present

## 2022-12-10 DIAGNOSIS — N2581 Secondary hyperparathyroidism of renal origin: Secondary | ICD-10-CM | POA: Diagnosis not present

## 2022-12-12 DIAGNOSIS — N2581 Secondary hyperparathyroidism of renal origin: Secondary | ICD-10-CM | POA: Diagnosis not present

## 2022-12-12 DIAGNOSIS — E1122 Type 2 diabetes mellitus with diabetic chronic kidney disease: Secondary | ICD-10-CM | POA: Diagnosis not present

## 2022-12-12 DIAGNOSIS — N186 End stage renal disease: Secondary | ICD-10-CM | POA: Diagnosis not present

## 2022-12-12 DIAGNOSIS — Z992 Dependence on renal dialysis: Secondary | ICD-10-CM | POA: Diagnosis not present

## 2022-12-12 DIAGNOSIS — G8929 Other chronic pain: Secondary | ICD-10-CM | POA: Diagnosis not present

## 2022-12-12 DIAGNOSIS — D631 Anemia in chronic kidney disease: Secondary | ICD-10-CM | POA: Diagnosis not present

## 2022-12-14 DIAGNOSIS — E1122 Type 2 diabetes mellitus with diabetic chronic kidney disease: Secondary | ICD-10-CM | POA: Diagnosis not present

## 2022-12-14 DIAGNOSIS — N2581 Secondary hyperparathyroidism of renal origin: Secondary | ICD-10-CM | POA: Diagnosis not present

## 2022-12-14 DIAGNOSIS — D631 Anemia in chronic kidney disease: Secondary | ICD-10-CM | POA: Diagnosis not present

## 2022-12-14 DIAGNOSIS — Z992 Dependence on renal dialysis: Secondary | ICD-10-CM | POA: Diagnosis not present

## 2022-12-14 DIAGNOSIS — N186 End stage renal disease: Secondary | ICD-10-CM | POA: Diagnosis not present

## 2022-12-17 DIAGNOSIS — N186 End stage renal disease: Secondary | ICD-10-CM | POA: Diagnosis not present

## 2022-12-17 DIAGNOSIS — N2581 Secondary hyperparathyroidism of renal origin: Secondary | ICD-10-CM | POA: Diagnosis not present

## 2022-12-17 DIAGNOSIS — E1122 Type 2 diabetes mellitus with diabetic chronic kidney disease: Secondary | ICD-10-CM | POA: Diagnosis not present

## 2022-12-17 DIAGNOSIS — Z992 Dependence on renal dialysis: Secondary | ICD-10-CM | POA: Diagnosis not present

## 2022-12-17 DIAGNOSIS — D631 Anemia in chronic kidney disease: Secondary | ICD-10-CM | POA: Diagnosis not present

## 2022-12-19 DIAGNOSIS — Z992 Dependence on renal dialysis: Secondary | ICD-10-CM | POA: Diagnosis not present

## 2022-12-19 DIAGNOSIS — N2581 Secondary hyperparathyroidism of renal origin: Secondary | ICD-10-CM | POA: Diagnosis not present

## 2022-12-19 DIAGNOSIS — D631 Anemia in chronic kidney disease: Secondary | ICD-10-CM | POA: Diagnosis not present

## 2022-12-19 DIAGNOSIS — E1122 Type 2 diabetes mellitus with diabetic chronic kidney disease: Secondary | ICD-10-CM | POA: Diagnosis not present

## 2022-12-19 DIAGNOSIS — N186 End stage renal disease: Secondary | ICD-10-CM | POA: Diagnosis not present

## 2022-12-21 DIAGNOSIS — Z992 Dependence on renal dialysis: Secondary | ICD-10-CM | POA: Diagnosis not present

## 2022-12-21 DIAGNOSIS — N2581 Secondary hyperparathyroidism of renal origin: Secondary | ICD-10-CM | POA: Diagnosis not present

## 2022-12-21 DIAGNOSIS — D631 Anemia in chronic kidney disease: Secondary | ICD-10-CM | POA: Diagnosis not present

## 2022-12-21 DIAGNOSIS — E1122 Type 2 diabetes mellitus with diabetic chronic kidney disease: Secondary | ICD-10-CM | POA: Diagnosis not present

## 2022-12-21 DIAGNOSIS — N186 End stage renal disease: Secondary | ICD-10-CM | POA: Diagnosis not present

## 2022-12-24 DIAGNOSIS — Z992 Dependence on renal dialysis: Secondary | ICD-10-CM | POA: Diagnosis not present

## 2022-12-24 DIAGNOSIS — N2581 Secondary hyperparathyroidism of renal origin: Secondary | ICD-10-CM | POA: Diagnosis not present

## 2022-12-24 DIAGNOSIS — D631 Anemia in chronic kidney disease: Secondary | ICD-10-CM | POA: Diagnosis not present

## 2022-12-24 DIAGNOSIS — N186 End stage renal disease: Secondary | ICD-10-CM | POA: Diagnosis not present

## 2022-12-24 DIAGNOSIS — E1122 Type 2 diabetes mellitus with diabetic chronic kidney disease: Secondary | ICD-10-CM | POA: Diagnosis not present

## 2022-12-25 DIAGNOSIS — G8929 Other chronic pain: Secondary | ICD-10-CM | POA: Diagnosis not present

## 2022-12-26 DIAGNOSIS — E1122 Type 2 diabetes mellitus with diabetic chronic kidney disease: Secondary | ICD-10-CM | POA: Diagnosis not present

## 2022-12-26 DIAGNOSIS — D631 Anemia in chronic kidney disease: Secondary | ICD-10-CM | POA: Diagnosis not present

## 2022-12-26 DIAGNOSIS — Z992 Dependence on renal dialysis: Secondary | ICD-10-CM | POA: Diagnosis not present

## 2022-12-26 DIAGNOSIS — N186 End stage renal disease: Secondary | ICD-10-CM | POA: Diagnosis not present

## 2022-12-26 DIAGNOSIS — N2581 Secondary hyperparathyroidism of renal origin: Secondary | ICD-10-CM | POA: Diagnosis not present

## 2022-12-28 DIAGNOSIS — Z992 Dependence on renal dialysis: Secondary | ICD-10-CM | POA: Diagnosis not present

## 2022-12-28 DIAGNOSIS — N186 End stage renal disease: Secondary | ICD-10-CM | POA: Diagnosis not present

## 2022-12-28 DIAGNOSIS — D631 Anemia in chronic kidney disease: Secondary | ICD-10-CM | POA: Diagnosis not present

## 2022-12-28 DIAGNOSIS — N2581 Secondary hyperparathyroidism of renal origin: Secondary | ICD-10-CM | POA: Diagnosis not present

## 2022-12-28 DIAGNOSIS — E1122 Type 2 diabetes mellitus with diabetic chronic kidney disease: Secondary | ICD-10-CM | POA: Diagnosis not present

## 2022-12-30 DIAGNOSIS — D631 Anemia in chronic kidney disease: Secondary | ICD-10-CM | POA: Diagnosis not present

## 2022-12-30 DIAGNOSIS — Z992 Dependence on renal dialysis: Secondary | ICD-10-CM | POA: Diagnosis not present

## 2022-12-30 DIAGNOSIS — N2581 Secondary hyperparathyroidism of renal origin: Secondary | ICD-10-CM | POA: Diagnosis not present

## 2022-12-30 DIAGNOSIS — N186 End stage renal disease: Secondary | ICD-10-CM | POA: Diagnosis not present

## 2022-12-30 DIAGNOSIS — E1122 Type 2 diabetes mellitus with diabetic chronic kidney disease: Secondary | ICD-10-CM | POA: Diagnosis not present

## 2023-01-01 DIAGNOSIS — G8929 Other chronic pain: Secondary | ICD-10-CM | POA: Diagnosis not present

## 2023-01-01 DIAGNOSIS — F17200 Nicotine dependence, unspecified, uncomplicated: Secondary | ICD-10-CM | POA: Diagnosis not present

## 2023-01-01 DIAGNOSIS — E1122 Type 2 diabetes mellitus with diabetic chronic kidney disease: Secondary | ICD-10-CM | POA: Diagnosis not present

## 2023-01-01 DIAGNOSIS — N2581 Secondary hyperparathyroidism of renal origin: Secondary | ICD-10-CM | POA: Diagnosis not present

## 2023-01-01 DIAGNOSIS — D631 Anemia in chronic kidney disease: Secondary | ICD-10-CM | POA: Diagnosis not present

## 2023-01-01 DIAGNOSIS — N186 End stage renal disease: Secondary | ICD-10-CM | POA: Diagnosis not present

## 2023-01-01 DIAGNOSIS — Z992 Dependence on renal dialysis: Secondary | ICD-10-CM | POA: Diagnosis not present
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
## 2023-01-03 ENCOUNTER — Ambulatory Visit: Payer: 59 | Attending: Cardiology | Admitting: Cardiology

## 2023-01-03 ENCOUNTER — Other Ambulatory Visit (HOSPITAL_COMMUNITY): Payer: Self-pay

## 2023-01-03 ENCOUNTER — Encounter: Payer: Self-pay | Admitting: Cardiology

## 2023-01-03 VITALS — BP 154/78 | HR 57 | Ht 68.0 in | Wt 163.0 lb

## 2023-01-03 DIAGNOSIS — E1159 Type 2 diabetes mellitus with other circulatory complications: Secondary | ICD-10-CM | POA: Diagnosis not present

## 2023-01-03 DIAGNOSIS — I152 Hypertension secondary to endocrine disorders: Secondary | ICD-10-CM | POA: Diagnosis not present

## 2023-01-03 DIAGNOSIS — I739 Peripheral vascular disease, unspecified: Secondary | ICD-10-CM

## 2023-01-03 DIAGNOSIS — Z8673 Personal history of transient ischemic attack (TIA), and cerebral infarction without residual deficits: Secondary | ICD-10-CM | POA: Diagnosis not present

## 2023-01-03 DIAGNOSIS — Z720 Tobacco use: Secondary | ICD-10-CM | POA: Diagnosis not present

## 2023-01-03 DIAGNOSIS — R079 Chest pain, unspecified: Secondary | ICD-10-CM | POA: Diagnosis not present

## 2023-01-03 DIAGNOSIS — I77819 Aortic ectasia, unspecified site: Secondary | ICD-10-CM | POA: Diagnosis not present

## 2023-01-03 MED ORDER — NICOTINE 7 MG/24HR TD PT24: 7.0000 mg | MEDICATED_PATCH | Freq: Every day | TRANSDERMAL | 1 refills | Status: DC

## 2023-01-03 MED ORDER — NICOTINE 7 MG/24HR TD PT24
7.0000 mg | MEDICATED_PATCH | Freq: Every day | TRANSDERMAL | 1 refills | Status: DC
  Filled 2023-01-03: qty 28, 28d supply, fill #0

## 2023-01-03 NOTE — Patient Instructions (Addendum)
Medication Instructions:  Your physician has recommended you make the following change in your medication:  START: Nicotine 7 mg patches change every 24 hours   *If you need a refill on your cardiac medications before your next appointment, please call your pharmacy*  Testing/Procedures:    Please report to Radiology at the [facility] Main Entrance 30 minutes early for your test.  [address] [place], [place] [address]                     How to Prepare for Your Cardiac PET/CT Stress Test:  Nothing to eat or drink, except water, 3 hours prior to arrival time.  NO caffeine/decaffeinated products, or chocolate 12 hours prior to arrival. (Please note decaffeinated beverages (teas/coffees) still contain caffeine).  If you have caffeine within 12 hours prior, the test will need to be rescheduled.  Medication instructions: Do not take erectile dysfunction medications for 72 hours prior to test (sildenafil, tadalafil) Do not take nitrates (isosorbide mononitrate, Ranexa) the day before or day of test Do not take tamsulosin the day before or morning of test Hold theophylline containing medications for 12 hours. Hold Dipyridamole 48 hours prior to the test.  Diabetic Preparation: If able to eat breakfast prior to 3 hour fasting, you may take all medications, including your insulin. Do not worry if you miss your breakfast dose of insulin - start at your next meal. If you do not eat prior to 3 hour fast-Hold all diabetes (oral and insulin) medications. Patients who wear a continuous glucose monitor MUST remove the device prior to scanning.  You may take your remaining medications with water.  NO perfume, cologne or lotion on chest or abdomen area.  Total time is 1 to 2 hours; you may want to bring reading material for the waiting time.  IF YOU THINK YOU MAY BE PREGNANT, OR ARE NURSING PLEASE INFORM THE TECHNOLOGIST.  In preparation for your appointment, medication and  supplies will be purchased.  Appointment availability is limited, so if you need to cancel or reschedule, please call the Radiology Department at [phone] [facility]) [place] [phone] [facility]) 24 hours in advance to avoid a cancellation fee of $100.00  What to Expect When you Arrive:  Once you arrive and check in for your appointment, you will be taken to a preparation room within the Radiology Department.  A technologist or Nurse will obtain your medical history, verify that you are correctly prepped for the exam, and explain the procedure.  Afterwards, an IV will be started in your arm and electrodes will be placed on your skin for EKG monitoring during the stress portion of the exam. Then you will be escorted to the PET/CT scanner.  There, staff will get you positioned on the scanner and obtain a blood pressure and EKG.  During the exam, you will continue to be connected to the EKG and blood pressure machines.  A small, safe amount of a radioactive tracer will be injected in your IV to obtain a series of pictures of your heart along with an injection of a stress agent.    After your Exam:  It is recommended that you eat a meal and drink a caffeinated beverage to counter act any effects of the stress agent.  Drink plenty of fluids for the remainder of the day and urinate frequently for the first couple of hours after the exam.  Your doctor will inform you of your test results within 7-10  business days.  For more information and frequently asked questions, please visit our website: [email or web address]  For questions about your test or how to prepare for your test, please call: Cardiac Imaging Nurse Navigators Office: [phone]  Your physician has requested that you have an echocardiogram. Echocardiography is a painless test that uses sound waves to create images of your heart. It provides your doctor with information about the size and shape of your heart and how well your  heart's chambers and valves are working. This procedure takes approximately one hour. There are no restrictions for this procedure. Please do NOT wear cologne, perfume, aftershave, or lotions (deodorant is allowed). Please arrive 15 minutes prior to your appointment time.  Please note: We ask at that you not bring children with you during ultrasound (echo/ vascular) testing. Due to room size and safety concerns, children are not allowed in the ultrasound rooms during exams. Our front office staff cannot provide observation of children in our lobby area while testing is being conducted. An adult accompanying a patient to their appointment will only be allowed in the ultrasound room at the discretion of the ultrasound technician under special circumstances. We apologize for any inconvenience.   Follow-Up: At [facility], you and your health needs are our priority.  As part of our continuing mission to provide you with exceptional heart care, we have created designated Provider Care Teams.  These Care Teams include your primary Cardiologist (physician) and Advanced Practice Providers (APPs -  Physician Assistants and Nurse Practitioners) who all work together to provide you with the care you need, when you need it.  We recommend signing up for the patient portal called "MyChart".  Sign up information is provided on this After Visit Summary.  MyChart is used to connect with patients for Virtual Visits (Telemedicine).  Patients are able to view lab/test results, encounter notes, upcoming appointments, etc.  Non-urgent messages can be sent to your provider as well.   To learn more about what you can do with MyChart, go to [email or web address].    Your next appointment:   3 month(s)  Provider:   Epifanio Lesches, MD

## 2023-01-04 DIAGNOSIS — Z992 Dependence on renal dialysis: Secondary | ICD-10-CM | POA: Diagnosis not present

## 2023-01-04 DIAGNOSIS — D631 Anemia in chronic kidney disease: Secondary | ICD-10-CM | POA: Diagnosis not present

## 2023-01-04 DIAGNOSIS — N186 End stage renal disease: Secondary | ICD-10-CM | POA: Diagnosis not present

## 2023-01-04 DIAGNOSIS — N2581 Secondary hyperparathyroidism of renal origin: Secondary | ICD-10-CM | POA: Diagnosis not present

## 2023-01-04 DIAGNOSIS — E1122 Type 2 diabetes mellitus with diabetic chronic kidney disease: Secondary | ICD-10-CM | POA: Diagnosis not present

## 2023-01-06 DIAGNOSIS — E1122 Type 2 diabetes mellitus with diabetic chronic kidney disease: Secondary | ICD-10-CM | POA: Diagnosis not present

## 2023-01-06 DIAGNOSIS — Z992 Dependence on renal dialysis: Secondary | ICD-10-CM | POA: Diagnosis not present

## 2023-01-06 DIAGNOSIS — N2581 Secondary hyperparathyroidism of renal origin: Secondary | ICD-10-CM | POA: Diagnosis not present

## 2023-01-06 DIAGNOSIS — D631 Anemia in chronic kidney disease: Secondary | ICD-10-CM | POA: Diagnosis not present

## 2023-01-06 DIAGNOSIS — N186 End stage renal disease: Secondary | ICD-10-CM | POA: Diagnosis not present

## 2023-01-07 DIAGNOSIS — I629 Nontraumatic intracranial hemorrhage, unspecified: Secondary | ICD-10-CM | POA: Diagnosis not present

## 2023-01-07 DIAGNOSIS — M6281 Muscle weakness (generalized): Secondary | ICD-10-CM | POA: Diagnosis not present

## 2023-01-08 DIAGNOSIS — D631 Anemia in chronic kidney disease: Secondary | ICD-10-CM | POA: Diagnosis not present

## 2023-01-08 DIAGNOSIS — N186 End stage renal disease: Secondary | ICD-10-CM | POA: Diagnosis not present

## 2023-01-08 DIAGNOSIS — Z992 Dependence on renal dialysis: Secondary | ICD-10-CM | POA: Diagnosis not present

## 2023-01-08 DIAGNOSIS — E1122 Type 2 diabetes mellitus with diabetic chronic kidney disease: Secondary | ICD-10-CM | POA: Diagnosis not present

## 2023-01-08 DIAGNOSIS — N2581 Secondary hyperparathyroidism of renal origin: Secondary | ICD-10-CM | POA: Diagnosis not present

## 2023-01-10 DIAGNOSIS — M6281 Muscle weakness (generalized): Secondary | ICD-10-CM | POA: Diagnosis not present

## 2023-01-10 DIAGNOSIS — I629 Nontraumatic intracranial hemorrhage, unspecified: Secondary | ICD-10-CM | POA: Diagnosis not present

## 2023-01-11 DIAGNOSIS — N186 End stage renal disease: Secondary | ICD-10-CM | POA: Diagnosis not present

## 2023-01-11 DIAGNOSIS — Z992 Dependence on renal dialysis: Secondary | ICD-10-CM | POA: Diagnosis not present

## 2023-01-11 DIAGNOSIS — D631 Anemia in chronic kidney disease: Secondary | ICD-10-CM | POA: Diagnosis not present

## 2023-01-11 DIAGNOSIS — R509 Fever, unspecified: Secondary | ICD-10-CM | POA: Diagnosis not present

## 2023-01-11 DIAGNOSIS — I629 Nontraumatic intracranial hemorrhage, unspecified: Secondary | ICD-10-CM | POA: Diagnosis not present

## 2023-01-11 DIAGNOSIS — G8929 Other chronic pain: Secondary | ICD-10-CM | POA: Diagnosis not present

## 2023-01-11 DIAGNOSIS — R197 Diarrhea, unspecified: Secondary | ICD-10-CM | POA: Diagnosis not present

## 2023-01-11 DIAGNOSIS — M6281 Muscle weakness (generalized): Secondary | ICD-10-CM | POA: Diagnosis not present

## 2023-01-11 DIAGNOSIS — N2581 Secondary hyperparathyroidism of renal origin: Secondary | ICD-10-CM | POA: Diagnosis not present

## 2023-01-14 DIAGNOSIS — R197 Diarrhea, unspecified: Secondary | ICD-10-CM | POA: Diagnosis not present

## 2023-01-14 DIAGNOSIS — Z992 Dependence on renal dialysis: Secondary | ICD-10-CM | POA: Diagnosis not present

## 2023-01-14 DIAGNOSIS — D631 Anemia in chronic kidney disease: Secondary | ICD-10-CM | POA: Diagnosis not present

## 2023-01-14 DIAGNOSIS — N2581 Secondary hyperparathyroidism of renal origin: Secondary | ICD-10-CM | POA: Diagnosis not present

## 2023-01-14 DIAGNOSIS — I629 Nontraumatic intracranial hemorrhage, unspecified: Secondary | ICD-10-CM | POA: Diagnosis not present

## 2023-01-14 DIAGNOSIS — N186 End stage renal disease: Secondary | ICD-10-CM | POA: Diagnosis not present

## 2023-01-14 DIAGNOSIS — M6281 Muscle weakness (generalized): Secondary | ICD-10-CM | POA: Diagnosis not present

## 2023-01-14 DIAGNOSIS — R509 Fever, unspecified: Secondary | ICD-10-CM | POA: Diagnosis not present

## 2023-01-15 DIAGNOSIS — M6281 Muscle weakness (generalized): Secondary | ICD-10-CM | POA: Diagnosis not present

## 2023-01-15 DIAGNOSIS — I629 Nontraumatic intracranial hemorrhage, unspecified: Secondary | ICD-10-CM | POA: Diagnosis not present

## 2023-01-16 DIAGNOSIS — I629 Nontraumatic intracranial hemorrhage, unspecified: Secondary | ICD-10-CM | POA: Diagnosis not present

## 2023-01-16 DIAGNOSIS — R197 Diarrhea, unspecified: Secondary | ICD-10-CM | POA: Diagnosis not present

## 2023-01-16 DIAGNOSIS — N2581 Secondary hyperparathyroidism of renal origin: Secondary | ICD-10-CM | POA: Diagnosis not present

## 2023-01-16 DIAGNOSIS — M6281 Muscle weakness (generalized): Secondary | ICD-10-CM | POA: Diagnosis not present

## 2023-01-16 DIAGNOSIS — R509 Fever, unspecified: Secondary | ICD-10-CM | POA: Diagnosis not present

## 2023-01-16 DIAGNOSIS — N186 End stage renal disease: Secondary | ICD-10-CM | POA: Diagnosis not present

## 2023-01-16 DIAGNOSIS — Z992 Dependence on renal dialysis: Secondary | ICD-10-CM | POA: Diagnosis not present

## 2023-01-16 DIAGNOSIS — D631 Anemia in chronic kidney disease: Secondary | ICD-10-CM | POA: Diagnosis not present

## 2023-01-17 DIAGNOSIS — I10 Essential (primary) hypertension: Secondary | ICD-10-CM | POA: Diagnosis not present

## 2023-01-17 DIAGNOSIS — E119 Type 2 diabetes mellitus without complications: Secondary | ICD-10-CM | POA: Diagnosis not present

## 2023-01-18 DIAGNOSIS — N186 End stage renal disease: Secondary | ICD-10-CM | POA: Diagnosis not present

## 2023-01-18 DIAGNOSIS — D631 Anemia in chronic kidney disease: Secondary | ICD-10-CM | POA: Diagnosis not present

## 2023-01-18 DIAGNOSIS — R509 Fever, unspecified: Secondary | ICD-10-CM | POA: Diagnosis not present

## 2023-01-18 DIAGNOSIS — E1122 Type 2 diabetes mellitus with diabetic chronic kidney disease: Secondary | ICD-10-CM | POA: Diagnosis not present

## 2023-01-18 DIAGNOSIS — E785 Hyperlipidemia, unspecified: Secondary | ICD-10-CM | POA: Diagnosis not present

## 2023-01-18 DIAGNOSIS — R197 Diarrhea, unspecified: Secondary | ICD-10-CM | POA: Diagnosis not present

## 2023-01-18 DIAGNOSIS — Z992 Dependence on renal dialysis: Secondary | ICD-10-CM | POA: Diagnosis not present

## 2023-01-18 DIAGNOSIS — N2581 Secondary hyperparathyroidism of renal origin: Secondary | ICD-10-CM | POA: Diagnosis not present

## 2023-01-20 DIAGNOSIS — M6281 Muscle weakness (generalized): Secondary | ICD-10-CM | POA: Diagnosis not present

## 2023-01-20 DIAGNOSIS — I629 Nontraumatic intracranial hemorrhage, unspecified: Secondary | ICD-10-CM | POA: Diagnosis not present

## 2023-01-21 DIAGNOSIS — D631 Anemia in chronic kidney disease: Secondary | ICD-10-CM | POA: Diagnosis not present

## 2023-01-21 DIAGNOSIS — N186 End stage renal disease: Secondary | ICD-10-CM | POA: Diagnosis not present

## 2023-01-21 DIAGNOSIS — I629 Nontraumatic intracranial hemorrhage, unspecified: Secondary | ICD-10-CM | POA: Diagnosis not present

## 2023-01-21 DIAGNOSIS — M6281 Muscle weakness (generalized): Secondary | ICD-10-CM | POA: Diagnosis not present

## 2023-01-21 DIAGNOSIS — Z992 Dependence on renal dialysis: Secondary | ICD-10-CM | POA: Diagnosis not present

## 2023-01-21 DIAGNOSIS — N2581 Secondary hyperparathyroidism of renal origin: Secondary | ICD-10-CM | POA: Diagnosis not present

## 2023-01-21 DIAGNOSIS — R197 Diarrhea, unspecified: Secondary | ICD-10-CM | POA: Diagnosis not present

## 2023-01-21 DIAGNOSIS — R509 Fever, unspecified: Secondary | ICD-10-CM | POA: Diagnosis not present

## 2023-01-22 DIAGNOSIS — G8929 Other chronic pain: Secondary | ICD-10-CM | POA: Diagnosis not present

## 2023-01-23 DIAGNOSIS — R509 Fever, unspecified: Secondary | ICD-10-CM | POA: Diagnosis not present

## 2023-01-23 DIAGNOSIS — R197 Diarrhea, unspecified: Secondary | ICD-10-CM | POA: Diagnosis not present

## 2023-01-23 DIAGNOSIS — N186 End stage renal disease: Secondary | ICD-10-CM | POA: Diagnosis not present

## 2023-01-23 DIAGNOSIS — Z992 Dependence on renal dialysis: Secondary | ICD-10-CM | POA: Diagnosis not present

## 2023-01-23 DIAGNOSIS — N2581 Secondary hyperparathyroidism of renal origin: Secondary | ICD-10-CM | POA: Diagnosis not present

## 2023-01-23 DIAGNOSIS — D631 Anemia in chronic kidney disease: Secondary | ICD-10-CM | POA: Diagnosis not present

## 2023-01-25 DIAGNOSIS — R509 Fever, unspecified: Secondary | ICD-10-CM | POA: Diagnosis not present

## 2023-01-25 DIAGNOSIS — D631 Anemia in chronic kidney disease: Secondary | ICD-10-CM | POA: Diagnosis not present

## 2023-01-25 DIAGNOSIS — I629 Nontraumatic intracranial hemorrhage, unspecified: Secondary | ICD-10-CM | POA: Diagnosis not present

## 2023-01-25 DIAGNOSIS — M6281 Muscle weakness (generalized): Secondary | ICD-10-CM | POA: Diagnosis not present

## 2023-01-25 DIAGNOSIS — R197 Diarrhea, unspecified: Secondary | ICD-10-CM | POA: Diagnosis not present

## 2023-01-25 DIAGNOSIS — Z992 Dependence on renal dialysis: Secondary | ICD-10-CM | POA: Diagnosis not present

## 2023-01-25 DIAGNOSIS — N2581 Secondary hyperparathyroidism of renal origin: Secondary | ICD-10-CM | POA: Diagnosis not present

## 2023-01-25 DIAGNOSIS — N186 End stage renal disease: Secondary | ICD-10-CM | POA: Diagnosis not present

## 2023-01-28 DIAGNOSIS — I639 Cerebral infarction, unspecified: Secondary | ICD-10-CM | POA: Diagnosis not present

## 2023-01-28 DIAGNOSIS — G546 Phantom limb syndrome with pain: Secondary | ICD-10-CM | POA: Diagnosis not present

## 2023-01-28 DIAGNOSIS — R197 Diarrhea, unspecified: Secondary | ICD-10-CM | POA: Diagnosis not present

## 2023-01-28 DIAGNOSIS — Z992 Dependence on renal dialysis: Secondary | ICD-10-CM | POA: Diagnosis not present

## 2023-01-28 DIAGNOSIS — F17200 Nicotine dependence, unspecified, uncomplicated: Secondary | ICD-10-CM | POA: Diagnosis not present

## 2023-01-28 DIAGNOSIS — I10 Essential (primary) hypertension: Secondary | ICD-10-CM | POA: Diagnosis not present

## 2023-01-28 DIAGNOSIS — N186 End stage renal disease: Secondary | ICD-10-CM | POA: Diagnosis not present

## 2023-01-28 DIAGNOSIS — E1122 Type 2 diabetes mellitus with diabetic chronic kidney disease: Secondary | ICD-10-CM | POA: Diagnosis not present

## 2023-01-28 DIAGNOSIS — R509 Fever, unspecified: Secondary | ICD-10-CM | POA: Diagnosis not present

## 2023-01-28 DIAGNOSIS — N2581 Secondary hyperparathyroidism of renal origin: Secondary | ICD-10-CM | POA: Diagnosis not present

## 2023-01-28 DIAGNOSIS — M79605 Pain in left leg: Secondary | ICD-10-CM | POA: Diagnosis not present

## 2023-01-28 DIAGNOSIS — D631 Anemia in chronic kidney disease: Secondary | ICD-10-CM | POA: Diagnosis not present

## 2023-01-28 DIAGNOSIS — E785 Hyperlipidemia, unspecified: Secondary | ICD-10-CM | POA: Diagnosis not present

## 2023-01-29 DIAGNOSIS — G546 Phantom limb syndrome with pain: Secondary | ICD-10-CM | POA: Diagnosis not present

## 2023-01-29 DIAGNOSIS — M25562 Pain in left knee: Secondary | ICD-10-CM | POA: Diagnosis not present

## 2023-01-29 DIAGNOSIS — G459 Transient cerebral ischemic attack, unspecified: Secondary | ICD-10-CM | POA: Diagnosis not present

## 2023-01-29 DIAGNOSIS — E119 Type 2 diabetes mellitus without complications: Secondary | ICD-10-CM | POA: Diagnosis not present

## 2023-01-29 DIAGNOSIS — Z89511 Acquired absence of right leg below knee: Secondary | ICD-10-CM | POA: Diagnosis not present

## 2023-01-29 DIAGNOSIS — Z89512 Acquired absence of left leg below knee: Secondary | ICD-10-CM | POA: Diagnosis not present

## 2023-01-29 DIAGNOSIS — U071 COVID-19: Secondary | ICD-10-CM | POA: Diagnosis not present

## 2023-01-29 DIAGNOSIS — G629 Polyneuropathy, unspecified: Secondary | ICD-10-CM | POA: Diagnosis not present

## 2023-01-29 DIAGNOSIS — I629 Nontraumatic intracranial hemorrhage, unspecified: Secondary | ICD-10-CM | POA: Diagnosis not present

## 2023-01-29 DIAGNOSIS — E785 Hyperlipidemia, unspecified: Secondary | ICD-10-CM | POA: Diagnosis not present

## 2023-01-30 DIAGNOSIS — R509 Fever, unspecified: Secondary | ICD-10-CM | POA: Diagnosis not present

## 2023-01-30 DIAGNOSIS — Z992 Dependence on renal dialysis: Secondary | ICD-10-CM | POA: Diagnosis not present

## 2023-01-30 DIAGNOSIS — N2581 Secondary hyperparathyroidism of renal origin: Secondary | ICD-10-CM | POA: Diagnosis not present

## 2023-01-30 DIAGNOSIS — D631 Anemia in chronic kidney disease: Secondary | ICD-10-CM | POA: Diagnosis not present

## 2023-01-30 DIAGNOSIS — R197 Diarrhea, unspecified: Secondary | ICD-10-CM | POA: Diagnosis not present

## 2023-01-30 DIAGNOSIS — N186 End stage renal disease: Secondary | ICD-10-CM | POA: Diagnosis not present

## 2023-01-31 ENCOUNTER — Ambulatory Visit (HOSPITAL_COMMUNITY): Payer: 59 | Attending: Cardiology

## 2023-01-31 DIAGNOSIS — N186 End stage renal disease: Secondary | ICD-10-CM | POA: Insufficient documentation

## 2023-01-31 DIAGNOSIS — I081 Rheumatic disorders of both mitral and tricuspid valves: Secondary | ICD-10-CM | POA: Diagnosis not present

## 2023-01-31 DIAGNOSIS — Z89511 Acquired absence of right leg below knee: Secondary | ICD-10-CM | POA: Diagnosis not present

## 2023-01-31 DIAGNOSIS — I1311 Hypertensive heart and chronic kidney disease without heart failure, with stage 5 chronic kidney disease, or end stage renal disease: Secondary | ICD-10-CM | POA: Diagnosis not present

## 2023-01-31 DIAGNOSIS — I371 Nonrheumatic pulmonary valve insufficiency: Secondary | ICD-10-CM | POA: Diagnosis not present

## 2023-01-31 DIAGNOSIS — E1122 Type 2 diabetes mellitus with diabetic chronic kidney disease: Secondary | ICD-10-CM | POA: Insufficient documentation

## 2023-01-31 DIAGNOSIS — I77819 Aortic ectasia, unspecified site: Secondary | ICD-10-CM | POA: Insufficient documentation

## 2023-01-31 DIAGNOSIS — Z89512 Acquired absence of left leg below knee: Secondary | ICD-10-CM | POA: Diagnosis not present

## 2023-01-31 DIAGNOSIS — E785 Hyperlipidemia, unspecified: Secondary | ICD-10-CM | POA: Diagnosis not present

## 2023-01-31 DIAGNOSIS — I361 Nonrheumatic tricuspid (valve) insufficiency: Secondary | ICD-10-CM | POA: Diagnosis not present

## 2023-01-31 LAB — ECHOCARDIOGRAM COMPLETE
Area-P 1/2: 6.12 cm2
MV M vel: 5.65 m/s
MV Peak grad: 127.7 mm[Hg]
S' Lateral: 3.7 cm

## 2023-02-01 DIAGNOSIS — R509 Fever, unspecified: Secondary | ICD-10-CM | POA: Diagnosis not present

## 2023-02-01 DIAGNOSIS — R197 Diarrhea, unspecified: Secondary | ICD-10-CM | POA: Diagnosis not present

## 2023-02-01 DIAGNOSIS — N186 End stage renal disease: Secondary | ICD-10-CM | POA: Diagnosis not present

## 2023-02-01 DIAGNOSIS — D631 Anemia in chronic kidney disease: Secondary | ICD-10-CM | POA: Diagnosis not present

## 2023-02-01 DIAGNOSIS — N2581 Secondary hyperparathyroidism of renal origin: Secondary | ICD-10-CM | POA: Diagnosis not present

## 2023-02-01 DIAGNOSIS — Z992 Dependence on renal dialysis: Secondary | ICD-10-CM | POA: Diagnosis not present

## 2023-02-04 ENCOUNTER — Telehealth: Payer: Self-pay | Admitting: *Deleted

## 2023-02-04 DIAGNOSIS — N186 End stage renal disease: Secondary | ICD-10-CM | POA: Diagnosis not present

## 2023-02-04 DIAGNOSIS — R197 Diarrhea, unspecified: Secondary | ICD-10-CM | POA: Diagnosis not present

## 2023-02-04 DIAGNOSIS — D631 Anemia in chronic kidney disease: Secondary | ICD-10-CM | POA: Diagnosis not present

## 2023-02-04 DIAGNOSIS — N2581 Secondary hyperparathyroidism of renal origin: Secondary | ICD-10-CM | POA: Diagnosis not present

## 2023-02-04 DIAGNOSIS — R509 Fever, unspecified: Secondary | ICD-10-CM | POA: Diagnosis not present

## 2023-02-04 DIAGNOSIS — Z992 Dependence on renal dialysis: Secondary | ICD-10-CM | POA: Diagnosis not present

## 2023-02-04 NOTE — Telephone Encounter (Signed)
Patient called into office and identified. Results given for Echo. Per Dr. Thomasenia Sales aortic dilation and some leakage of mitral valve, will monitor. Patient verbalized an understanding.

## 2023-02-04 NOTE — Telephone Encounter (Signed)
Called and left message on patient personal cell phone echo results. Left message to call office for any questions.

## 2023-02-05 DIAGNOSIS — Z992 Dependence on renal dialysis: Secondary | ICD-10-CM | POA: Diagnosis not present

## 2023-02-05 DIAGNOSIS — E1122 Type 2 diabetes mellitus with diabetic chronic kidney disease: Secondary | ICD-10-CM | POA: Diagnosis not present

## 2023-02-05 DIAGNOSIS — N186 End stage renal disease: Secondary | ICD-10-CM | POA: Diagnosis not present

## 2023-02-06 ENCOUNTER — Emergency Department (HOSPITAL_COMMUNITY)
Admission: EM | Admit: 2023-02-06 | Discharge: 2023-02-09 | Disposition: E | Payer: 59 | Attending: Emergency Medicine | Admitting: Emergency Medicine

## 2023-02-06 ENCOUNTER — Encounter (HOSPITAL_COMMUNITY): Payer: Self-pay

## 2023-02-06 DIAGNOSIS — N2581 Secondary hyperparathyroidism of renal origin: Secondary | ICD-10-CM | POA: Diagnosis not present

## 2023-02-06 DIAGNOSIS — Z79899 Other long term (current) drug therapy: Secondary | ICD-10-CM | POA: Diagnosis not present

## 2023-02-06 DIAGNOSIS — Z7901 Long term (current) use of anticoagulants: Secondary | ICD-10-CM | POA: Diagnosis not present

## 2023-02-06 DIAGNOSIS — I120 Hypertensive chronic kidney disease with stage 5 chronic kidney disease or end stage renal disease: Secondary | ICD-10-CM | POA: Insufficient documentation

## 2023-02-06 DIAGNOSIS — E1122 Type 2 diabetes mellitus with diabetic chronic kidney disease: Secondary | ICD-10-CM | POA: Diagnosis not present

## 2023-02-06 DIAGNOSIS — Z794 Long term (current) use of insulin: Secondary | ICD-10-CM | POA: Insufficient documentation

## 2023-02-06 DIAGNOSIS — D631 Anemia in chronic kidney disease: Secondary | ICD-10-CM | POA: Diagnosis not present

## 2023-02-06 DIAGNOSIS — Z743 Need for continuous supervision: Secondary | ICD-10-CM | POA: Diagnosis not present

## 2023-02-06 DIAGNOSIS — R197 Diarrhea, unspecified: Secondary | ICD-10-CM | POA: Diagnosis not present

## 2023-02-06 DIAGNOSIS — Z992 Dependence on renal dialysis: Secondary | ICD-10-CM | POA: Diagnosis not present

## 2023-02-06 DIAGNOSIS — R509 Fever, unspecified: Secondary | ICD-10-CM | POA: Diagnosis not present

## 2023-02-06 DIAGNOSIS — R404 Transient alteration of awareness: Secondary | ICD-10-CM | POA: Diagnosis not present

## 2023-02-06 DIAGNOSIS — R0689 Other abnormalities of breathing: Secondary | ICD-10-CM | POA: Diagnosis not present

## 2023-02-06 DIAGNOSIS — N186 End stage renal disease: Secondary | ICD-10-CM | POA: Diagnosis not present

## 2023-02-06 DIAGNOSIS — I469 Cardiac arrest, cause unspecified: Secondary | ICD-10-CM | POA: Insufficient documentation

## 2023-02-06 DIAGNOSIS — R55 Syncope and collapse: Secondary | ICD-10-CM | POA: Diagnosis present

## 2023-02-06 DIAGNOSIS — I499 Cardiac arrhythmia, unspecified: Secondary | ICD-10-CM | POA: Diagnosis not present

## 2023-02-08 DIAGNOSIS — Z992 Dependence on renal dialysis: Secondary | ICD-10-CM | POA: Diagnosis not present

## 2023-02-08 DIAGNOSIS — E1122 Type 2 diabetes mellitus with diabetic chronic kidney disease: Secondary | ICD-10-CM | POA: Diagnosis not present

## 2023-02-08 DIAGNOSIS — N186 End stage renal disease: Secondary | ICD-10-CM | POA: Diagnosis not present

## 2023-02-09 NOTE — ED Triage Notes (Signed)
Pt from dialysis, became hypotensive, and had syncopal ervent. Pt became pulseless and CPR started, total CPR time was 45 min before pt arrived. Pt in PEA. EMS gave 1g of calcium, 50 of bicarb, 8-10 epis. Pt arrives on lucas compressing.

## 2023-02-09 NOTE — ED Provider Notes (Signed)
[place] EMERGENCY DEPARTMENT AT [facility] Provider Note   CSN: [id number] Arrival date & time: 01/09/2023  1156     History  Chief Complaint  Patient presents with   Cardiac Arrest    Jorge [name] is a 53 y.o. male.  Patient arrives after 1 hour CPR in the field.  History of stage renal disease on dialysis, hypertension diabetes.  Patient was at dialysis was hypotensive there got IV fluids started dialysis went unconscious found to have no pulses.  CPR was initiated immediately EMS got there patient was in PEA he is gotten several rounds of epinephrine calcium bicarb but no return of spontaneous circulation.  They had 1 brief episode of pulses just shortly after starting CPR but did not last for more than a few minutes.  Total time with CPR now over an hour.  Level 5 caveat due to CPR in progress  The history is provided by the EMS personnel.       Home Medications Prior to Admission medications   Medication Sig Start Date End Date Taking? Authorizing Provider  amLODipine (NORVASC) 5 MG tablet Take 5 mg by mouth daily. 12/07/22   [provider]  ARTIFICIAL TEARS 0.2-0.2-1 % SOLN Place 1 drop into both eyes 4 (four) times daily.    [provider]  atenolol (TENORMIN) 25 MG tablet Take 25 mg by mouth daily.    [provider]  atorvastatin (LIPITOR) 40 MG tablet Take 1 tablet (40 mg total) by mouth daily. 07/30/20   Azucena Fallen, MD  cloNIDine (CATAPRES) 0.1 MG tablet Take 0.1 mg by mouth as needed. 03/09/22 03/08/23  [provider]  clopidogrel (PLAVIX) 75 MG tablet Take 1 tablet (75 mg total) by mouth daily. 02/14/21   Ihor Austin, NP  diclofenac Sodium (VOLTAREN) 1 % GEL Apply 2 g topically See admin instructions. Apply 2 grams of gel to the left hand three times a day 01/29/21   [provider]  glucagon (GLUCAGEN) 1 MG SOLR injection Inject 1 mg into the muscle as needed for low blood sugar.    [provider]  HUMALOG KWIKPEN 100 UNIT/ML KwikPen Inject 0-8 Units into the skin See admin instructions. Inject 0-10 units into the skin three times a day with meals, per sliding scale: BGL 0-150 = give nothing; 151-200 =  0 units; 201-250 = 2 units; 251-300 = 4 units; 301-350 = 6 units; 351-400 = 8 units; call PCP if <70 or >400 06/22/20   [provider]  HYDROcodone-acetaminophen (NORCO/VICODIN) 5-325 MG tablet Take 1 tablet by mouth every 6 (six) hours as needed. 12/25/22   [provider]  LANTUS SOLOSTAR 100 UNIT/ML Solostar Pen Inject 5 Units into the skin at bedtime. 12/17/20   [provider]  loperamide (IMODIUM A-D) 2 MG tablet Take 2-4 mg by mouth See admin instructions. Take 4 mg as an initial dose, then decrease to 2 mg after each unformed stool- not to exceed 24 mg in 48 hours and call MD if diarrhea persists greater than 48 hours    [provider]  LORazepam (ATIVAN) 0.5 MG tablet Take 1 tablet (0.5 mg total) by mouth every Monday, Wednesday, and Friday with hemodialysis. Before Dialysis due to anxiety 05/21/22   Rodolph Bong, MD  losartan (COZAAR) 50 MG tablet Take 1 tablet (50 mg total) by mouth daily. 05/20/22   Rodolph Bong, MD  LYRICA 25 MG capsule Take 25 mg by mouth 3 (  three) times a week. On Mon, Wed, Friday after Dialysis 12/06/21   [provider]  Melatonin 10 MG TABS Take 10 mg by mouth at bedtime.    [provider]  Multiple Vitamin (MULTIVITAMIN) tablet Take 1 tablet by mouth at bedtime.    [provider]  Multiple Vitamins-Minerals (PRORENAL + D) TABS Take 1,000 Units by mouth in the morning.    [provider]  nicotine (NICODERM CQ - DOSED IN MG/24 HR) 7 mg/24hr patch Place 1 patch (7 mg total) onto the skin daily. 01/03/23   Little Ishikawa, MD  Nutritional Supplements (ENSURE CLEAR) LIQD Take 240 mLs by mouth See admin instructions. Mix 240 ml's with sugar substitute and drink  every morning    [provider]  ondansetron (ZOFRAN) 4 MG tablet Take 4 mg by mouth every 8 (eight) hours as needed. 09/20/22   [provider]  OXYGEN Inhale 2 L/min into the lungs as needed (if sats drop below 92% during episodes of anxiety).    [provider]  pregabalin (LYRICA) 25 MG capsule Take 25 mg by mouth daily.    [provider]  PREPARATION H 1-0.25-14.4-15 % CREA Apply 1 application. topically every 6 (six) hours as needed (for hemorrhoids).    [provider]  promethazine (PHENERGAN) 25 MG suppository Place 1 suppository (25 mg total) rectally every 6 (six) hours as needed for nausea or vomiting. 12/16/21   Bethann Berkshire, MD  REFRESH LIQUIGEL 1 % GEL Place 1 drop into both eyes at bedtime.    [provider]  sertraline (ZOLOFT) 50 MG tablet Take 50 mg by mouth daily. 11/20/22   [provider]  sevelamer carbonate (RENVELA) 800 MG tablet Take 1,600-3,200 mg by mouth 2 (two) times daily. Take 1,600 mg by mouth two times a day (at 2 PM and 9 PM) and 3,200 mg three times a day "for dialysis" (at 7:30 AM, 12 NOON, and 5 PM) 01/18/21   [provider]  traMADol (ULTRAM) 50 MG tablet Take 0.5 tablets (25 mg total) by mouth every 6 (six) hours as needed for moderate pain. 05/19/22   Rodolph Bong, MD  traZODone (DESYREL) 50 MG tablet Take 1 tablet (50 mg total) by mouth at bedtime as needed for sleep. 05/19/22   Rodolph Bong, MD  Vitamin D, Ergocalciferol, (DRISDOL) 1.25 MG (50000 UNIT) CAPS capsule Take 50,000 Units by mouth every Saturday. 07/14/19   [provider]  White Petrolatum-Mineral Oil (REFRESH LACRI-LUBE) OINT Place into both eyes See admin instructions. Place 0.25 ribbon into each eye at bedtime    [provider]      Allergies    Lactose intolerance (gi)    Review of Systems   Review of Systems  Physical Exam Updated Vital Signs There were no vitals taken for this  visit. Physical Exam Constitutional:      General: He is in acute distress.     Comments: Pupils fixed and dilated, Igel in place with BVM  Eyes:     Conjunctiva/sclera: Conjunctivae normal.  Cardiovascular:     Comments: No pulses Pulmonary:     Comments: Clear breath sounds with BVM Abdominal:     General: Abdomen is flat.  Musculoskeletal:        General: No swelling.  Skin:    General: Skin is warm.  Neurological:     Comments: GCS 3     ED Results / Procedures / Treatments   Labs (  all labs ordered are listed, but only abnormal results are displayed) Labs Reviewed - No data to display  EKG None  Radiology No results found.  Procedures .Critical Care  Performed by: Virgina Norfolk, DO Authorized by: Virgina Norfolk, DO   Critical care provider statement:    Critical care time (minutes):  30   Critical care was necessary to treat or prevent imminent or life-threatening deterioration of the following conditions:  Cardiac failure and respiratory failure   Critical care was time spent personally by me on the following activities:  Obtaining history from patient or surrogate, interpretation of cardiac output measurements, pulse oximetry and review of old charts     Medications Ordered in ED Medications - No data to display  ED Course/ Medical Decision Making/ A&P                                 Medical Decision Making  Kiyon D Eckhardt is here as a level 5 caveat cardiac arrest.  Patient with end-stage renal disease on hemodialysis.  Diabetes history.  Patient with over 1 hour CPR now in the field.  Had loss of pulses at dialysis today where he arrived hypotensive but otherwise supposedly was doing well.  He got IV fluids while getting dialysis but lost pulses.  PEA rhythm.  Epinephrine calcium bicarb given several times in the field.  He arrives here still without a pulse.  Bedside echocardiogram showed no cardiac activity.  He got around the epinephrine calcium and  did not achieve return of spontaneous circulation.  He did have a brief return of pulses with EMS shortly after they started CPR but it has now been an hour since he is had any pulses.  His pupils are fixed and dilated.  I talked with sister who states that he was doing well.  Had a recent cold.  He has been having some chest pain recently.  Overall patient was pronounced deceased at 12:03 PM.  I talked with Alisia Ferrari with medical examiner and overall suspect natural causes and will not be an ME case.  I have no concern for trauma or substance abuse process.  Family also agrees.  Will notify primary care team.  Family was able to visit the patient at the bedside.  Overall I suspect cardiac event.  This chart was dictated using voice recognition software.  Despite best efforts to proofread,  errors can occur which can change the documentation meaning.         Final Clinical Impression(s) / ED Diagnoses Final diagnoses:  Cardiac arrest [facility])    Rx / DC Orders ED Discharge Orders     None         Virgina Norfolk, DO 01/25/2023 1257

## 2023-02-09 NOTE — ED Notes (Signed)
1159- Pulse check, no pulse, CPR continued  1202- Pulse check, epi given 1203- TOD

## 2023-02-09 DEATH — deceased

## 2023-03-05 ENCOUNTER — Other Ambulatory Visit (HOSPITAL_COMMUNITY): Payer: 59

## 2023-03-07 ENCOUNTER — Telehealth: Payer: Self-pay | Admitting: Cardiology

## 2023-03-07 NOTE — Telephone Encounter (Signed)
Death certificate completed.

## 2023-03-07 NOTE — Telephone Encounter (Signed)
 [facility] is wanting Dr Jerene Pitch  to sigh death certificate. Pt passwd way Mar 04, 2023
